# Patient Record
Sex: Female | Born: 1950 | Race: White | Hispanic: No | Smoking: Former smoker
Health system: Southern US, Community
[De-identification: ages and names within clinical notes are randomized; demographics above are authoritative.]

## PROBLEM LIST (undated history)

## (undated) ENCOUNTER — Ambulatory Visit: Admission: EM | Payer: Medicare Other | Source: Home / Self Care

## (undated) DIAGNOSIS — M8580 Other specified disorders of bone density and structure, unspecified site: Secondary | ICD-10-CM

## (undated) DIAGNOSIS — F32A Depression, unspecified: Secondary | ICD-10-CM

## (undated) DIAGNOSIS — I10 Essential (primary) hypertension: Secondary | ICD-10-CM

## (undated) DIAGNOSIS — E785 Hyperlipidemia, unspecified: Secondary | ICD-10-CM

## (undated) DIAGNOSIS — J449 Chronic obstructive pulmonary disease, unspecified: Secondary | ICD-10-CM

## (undated) DIAGNOSIS — I509 Heart failure, unspecified: Secondary | ICD-10-CM

## (undated) DIAGNOSIS — I499 Cardiac arrhythmia, unspecified: Secondary | ICD-10-CM

## (undated) DIAGNOSIS — I2510 Atherosclerotic heart disease of native coronary artery without angina pectoris: Secondary | ICD-10-CM

## (undated) DIAGNOSIS — Z720 Tobacco use: Secondary | ICD-10-CM

## (undated) DIAGNOSIS — Z780 Asymptomatic menopausal state: Secondary | ICD-10-CM

## (undated) DIAGNOSIS — K219 Gastro-esophageal reflux disease without esophagitis: Secondary | ICD-10-CM

## (undated) DIAGNOSIS — F329 Major depressive disorder, single episode, unspecified: Secondary | ICD-10-CM

## (undated) HISTORY — PX: TOTAL ABDOMINAL HYSTERECTOMY: SHX209

## (undated) HISTORY — DX: Major depressive disorder, single episode, unspecified: F32.9

## (undated) HISTORY — PX: CARDIAC CATHETERIZATION: SHX172

## (undated) HISTORY — PX: OTHER SURGICAL HISTORY: SHX169

## (undated) HISTORY — DX: Asymptomatic menopausal state: Z78.0

## (undated) HISTORY — DX: Depression, unspecified: F32.A

## (undated) HISTORY — DX: Hyperlipidemia, unspecified: E78.5

## (undated) HISTORY — DX: Heart failure, unspecified: I50.9

## (undated) HISTORY — PX: ABDOMINAL HYSTERECTOMY: SHX81

## (undated) HISTORY — DX: Tobacco use: Z72.0

## (undated) HISTORY — DX: Gastro-esophageal reflux disease without esophagitis: K21.9

## (undated) HISTORY — DX: Cardiac arrhythmia, unspecified: I49.9

## (undated) HISTORY — PX: CORONARY ANGIOPLASTY: SHX604

## (undated) HISTORY — DX: Essential (primary) hypertension: I10

## (undated) HISTORY — DX: Other specified disorders of bone density and structure, unspecified site: M85.80

---

## 2006-08-02 ENCOUNTER — Other Ambulatory Visit: Payer: Self-pay

## 2006-08-02 ENCOUNTER — Inpatient Hospital Stay: Payer: Self-pay | Admitting: Internal Medicine

## 2008-03-03 ENCOUNTER — Ambulatory Visit: Payer: Self-pay | Admitting: Cardiovascular Disease

## 2010-04-14 ENCOUNTER — Ambulatory Visit: Payer: Self-pay | Admitting: Cardiovascular Disease

## 2010-10-11 ENCOUNTER — Ambulatory Visit: Payer: Self-pay | Admitting: Nephrology

## 2011-01-14 ENCOUNTER — Ambulatory Visit: Payer: Self-pay | Admitting: Specialist

## 2011-08-08 ENCOUNTER — Ambulatory Visit: Payer: Self-pay | Admitting: Specialist

## 2011-08-08 LAB — CREATININE, SERUM
Creatinine: 0.85 mg/dL (ref 0.60–1.30)
EGFR (African American): >60
EGFR (Non-African Amer.): >60

## 2011-08-22 ENCOUNTER — Ambulatory Visit: Payer: Self-pay | Admitting: Specialist

## 2011-10-27 ENCOUNTER — Ambulatory Visit: Payer: Self-pay | Admitting: Specialist

## 2011-10-27 LAB — CREATININE, SERUM
EGFR (African American): >60
EGFR (Non-African Amer.): >60

## 2012-02-03 ENCOUNTER — Ambulatory Visit: Payer: Self-pay | Admitting: Specialist

## 2012-02-10 ENCOUNTER — Ambulatory Visit: Payer: Self-pay | Admitting: Specialist

## 2012-03-07 HISTORY — PX: CHOLECYSTECTOMY: SHX55

## 2012-04-02 ENCOUNTER — Ambulatory Visit: Payer: Self-pay | Admitting: Specialist

## 2012-05-07 ENCOUNTER — Ambulatory Visit: Payer: Self-pay

## 2012-08-22 ENCOUNTER — Ambulatory Visit: Payer: Self-pay

## 2012-10-23 ENCOUNTER — Ambulatory Visit: Payer: Self-pay | Admitting: Physician Assistant

## 2012-10-24 ENCOUNTER — Ambulatory Visit: Payer: Self-pay | Admitting: Specialist

## 2012-12-03 ENCOUNTER — Ambulatory Visit: Payer: Self-pay | Admitting: Gastroenterology

## 2012-12-06 ENCOUNTER — Ambulatory Visit: Payer: Self-pay | Admitting: Surgery

## 2012-12-06 LAB — COMPREHENSIVE METABOLIC PANEL
Albumin: 3.7 g/dL (ref 3.4–5.0)
Alkaline Phosphatase: 103 U/L (ref 50–136)
Anion Gap: 5 — ABNORMAL LOW (ref 7–16)
BUN: 17 mg/dL (ref 7–18)
Bilirubin,Total: 0.3 mg/dL (ref 0.2–1.0)
Calcium, Total: 9.3 mg/dL (ref 8.5–10.1)
Co2: 29 mmol/L (ref 21–32)
Potassium: 3.5 mmol/L (ref 3.5–5.1)
SGPT (ALT): 24 U/L (ref 12–78)
Sodium: 137 mmol/L (ref 136–145)

## 2012-12-10 ENCOUNTER — Ambulatory Visit: Payer: Self-pay | Admitting: Surgery

## 2012-12-10 LAB — CBC WITH DIFFERENTIAL/PLATELET
Basophil %: 0.9 %
Eosinophil #: 0.2 10*3/uL (ref 0.0–0.7)
Eosinophil %: 2.5 %
HGB: 12 g/dL (ref 12.0–16.0)
MCH: 31.5 pg (ref 26.0–34.0)
MCHC: 34.3 g/dL (ref 32.0–36.0)
MCV: 92 fL (ref 80–100)
Neutrophil #: 4.2 10*3/uL (ref 1.4–6.5)
Neutrophil %: 55.2 %
RBC: 3.83 10*6/uL (ref 3.80–5.20)
WBC: 7.7 10*3/uL (ref 3.6–11.0)

## 2012-12-13 ENCOUNTER — Ambulatory Visit: Payer: Self-pay | Admitting: Surgery

## 2012-12-18 LAB — PATHOLOGY REPORT

## 2013-02-04 ENCOUNTER — Ambulatory Visit: Payer: Self-pay | Admitting: Family Medicine

## 2013-02-06 ENCOUNTER — Ambulatory Visit: Payer: Self-pay | Admitting: Family Medicine

## 2013-02-06 LAB — CBC WITH DIFFERENTIAL/PLATELET
Basophil %: 0.5 %
Eosinophil %: 1 %
HGB: 12.2 g/dL (ref 12.0–16.0)
Lymphocyte #: 1.8 10*3/uL (ref 1.0–3.6)
MCH: 31.3 pg (ref 26.0–34.0)
MCHC: 34.6 g/dL (ref 32.0–36.0)
MCV: 91 fL (ref 80–100)
Monocyte #: 0.8 x10 3/mm (ref 0.2–0.9)
Neutrophil %: 72.2 %
Platelet: 223 10*3/uL (ref 150–440)
RBC: 3.89 10*6/uL (ref 3.80–5.20)
RDW: 13.6 % (ref 11.5–14.5)

## 2013-02-11 ENCOUNTER — Emergency Department: Payer: Self-pay | Admitting: Emergency Medicine

## 2013-02-11 LAB — COMPREHENSIVE METABOLIC PANEL
Albumin: 3 g/dL — ABNORMAL LOW (ref 3.4–5.0)
Alkaline Phosphatase: 108 U/L
Anion Gap: 7 (ref 7–16)
Calcium, Total: 9.2 mg/dL (ref 8.5–10.1)
Co2: 23 mmol/L (ref 21–32)
Creatinine: 0.95 mg/dL (ref 0.60–1.30)
EGFR (Non-African Amer.): >60
Glucose: 82 mg/dL (ref 65–99)
Osmolality: 257 (ref 275–301)
Potassium: 3.4 mmol/L — ABNORMAL LOW (ref 3.5–5.1)
SGOT(AST): 42 U/L — ABNORMAL HIGH (ref 15–37)
SGPT (ALT): 21 U/L (ref 12–78)
Sodium: 126 mmol/L — ABNORMAL LOW (ref 136–145)
Total Protein: 7.1 g/dL (ref 6.4–8.2)

## 2013-02-11 LAB — CBC
HCT: 37 % (ref 35.0–47.0)
MCH: 31 pg (ref 26.0–34.0)
MCV: 90 fL (ref 80–100)
Platelet: 253 10*3/uL (ref 150–440)
RBC: 4.1 10*6/uL (ref 3.80–5.20)
RDW: 13.1 % (ref 11.5–14.5)

## 2013-02-15 ENCOUNTER — Ambulatory Visit: Payer: Self-pay | Admitting: Family Medicine

## 2013-03-15 ENCOUNTER — Ambulatory Visit: Payer: Self-pay | Admitting: Family Medicine

## 2013-05-08 ENCOUNTER — Ambulatory Visit: Payer: Self-pay

## 2013-08-06 ENCOUNTER — Ambulatory Visit: Payer: Self-pay | Admitting: Specialist

## 2013-08-08 DIAGNOSIS — F17200 Nicotine dependence, unspecified, uncomplicated: Secondary | ICD-10-CM | POA: Insufficient documentation

## 2013-08-08 DIAGNOSIS — R918 Other nonspecific abnormal finding of lung field: Secondary | ICD-10-CM | POA: Insufficient documentation

## 2013-08-08 DIAGNOSIS — Z720 Tobacco use: Secondary | ICD-10-CM

## 2013-08-08 DIAGNOSIS — J449 Chronic obstructive pulmonary disease, unspecified: Secondary | ICD-10-CM | POA: Insufficient documentation

## 2013-11-29 DIAGNOSIS — J069 Acute upper respiratory infection, unspecified: Secondary | ICD-10-CM | POA: Diagnosis not present

## 2013-12-13 DIAGNOSIS — Z23 Encounter for immunization: Secondary | ICD-10-CM | POA: Diagnosis not present

## 2013-12-19 DIAGNOSIS — R0609 Other forms of dyspnea: Secondary | ICD-10-CM | POA: Diagnosis not present

## 2013-12-19 DIAGNOSIS — J449 Chronic obstructive pulmonary disease, unspecified: Secondary | ICD-10-CM | POA: Diagnosis not present

## 2013-12-19 DIAGNOSIS — J8410 Pulmonary fibrosis, unspecified: Secondary | ICD-10-CM | POA: Diagnosis not present

## 2013-12-19 DIAGNOSIS — F17290 Nicotine dependence, other tobacco product, uncomplicated: Secondary | ICD-10-CM | POA: Diagnosis not present

## 2013-12-25 DIAGNOSIS — J449 Chronic obstructive pulmonary disease, unspecified: Secondary | ICD-10-CM | POA: Diagnosis not present

## 2013-12-25 DIAGNOSIS — J189 Pneumonia, unspecified organism: Secondary | ICD-10-CM | POA: Diagnosis not present

## 2013-12-25 DIAGNOSIS — R509 Fever, unspecified: Secondary | ICD-10-CM | POA: Diagnosis not present

## 2013-12-27 DIAGNOSIS — J189 Pneumonia, unspecified organism: Secondary | ICD-10-CM | POA: Diagnosis not present

## 2014-01-23 DIAGNOSIS — I10 Essential (primary) hypertension: Secondary | ICD-10-CM | POA: Diagnosis not present

## 2014-01-23 DIAGNOSIS — E782 Mixed hyperlipidemia: Secondary | ICD-10-CM | POA: Diagnosis not present

## 2014-01-23 DIAGNOSIS — I2510 Atherosclerotic heart disease of native coronary artery without angina pectoris: Secondary | ICD-10-CM | POA: Diagnosis not present

## 2014-01-23 DIAGNOSIS — Z716 Tobacco abuse counseling: Secondary | ICD-10-CM | POA: Diagnosis not present

## 2014-01-23 DIAGNOSIS — R079 Chest pain, unspecified: Secondary | ICD-10-CM | POA: Diagnosis not present

## 2014-02-04 DIAGNOSIS — R079 Chest pain, unspecified: Secondary | ICD-10-CM | POA: Diagnosis not present

## 2014-02-06 DIAGNOSIS — R079 Chest pain, unspecified: Secondary | ICD-10-CM | POA: Diagnosis not present

## 2014-02-06 DIAGNOSIS — E782 Mixed hyperlipidemia: Secondary | ICD-10-CM | POA: Diagnosis not present

## 2014-02-06 DIAGNOSIS — I10 Essential (primary) hypertension: Secondary | ICD-10-CM | POA: Diagnosis not present

## 2014-02-06 DIAGNOSIS — I2510 Atherosclerotic heart disease of native coronary artery without angina pectoris: Secondary | ICD-10-CM | POA: Diagnosis not present

## 2014-03-04 DIAGNOSIS — I2510 Atherosclerotic heart disease of native coronary artery without angina pectoris: Secondary | ICD-10-CM | POA: Diagnosis not present

## 2014-03-04 DIAGNOSIS — Z0000 Encounter for general adult medical examination without abnormal findings: Secondary | ICD-10-CM | POA: Diagnosis not present

## 2014-03-04 DIAGNOSIS — Z23 Encounter for immunization: Secondary | ICD-10-CM | POA: Diagnosis not present

## 2014-03-04 DIAGNOSIS — F17290 Nicotine dependence, other tobacco product, uncomplicated: Secondary | ICD-10-CM | POA: Diagnosis not present

## 2014-03-04 DIAGNOSIS — I129 Hypertensive chronic kidney disease with stage 1 through stage 4 chronic kidney disease, or unspecified chronic kidney disease: Secondary | ICD-10-CM | POA: Diagnosis not present

## 2014-03-04 DIAGNOSIS — E785 Hyperlipidemia, unspecified: Secondary | ICD-10-CM | POA: Diagnosis not present

## 2014-03-04 DIAGNOSIS — J449 Chronic obstructive pulmonary disease, unspecified: Secondary | ICD-10-CM | POA: Diagnosis not present

## 2014-03-31 DIAGNOSIS — J159 Unspecified bacterial pneumonia: Secondary | ICD-10-CM | POA: Diagnosis not present

## 2014-04-16 DIAGNOSIS — J449 Chronic obstructive pulmonary disease, unspecified: Secondary | ICD-10-CM | POA: Diagnosis not present

## 2014-04-24 DIAGNOSIS — F17210 Nicotine dependence, cigarettes, uncomplicated: Secondary | ICD-10-CM | POA: Diagnosis not present

## 2014-04-24 DIAGNOSIS — J849 Interstitial pulmonary disease, unspecified: Secondary | ICD-10-CM | POA: Diagnosis not present

## 2014-04-24 DIAGNOSIS — J449 Chronic obstructive pulmonary disease, unspecified: Secondary | ICD-10-CM | POA: Diagnosis not present

## 2014-04-24 DIAGNOSIS — R911 Solitary pulmonary nodule: Secondary | ICD-10-CM | POA: Diagnosis not present

## 2014-05-23 ENCOUNTER — Ambulatory Visit: Payer: Self-pay

## 2014-05-23 DIAGNOSIS — Z1231 Encounter for screening mammogram for malignant neoplasm of breast: Secondary | ICD-10-CM | POA: Diagnosis not present

## 2014-06-27 NOTE — Op Note (Signed)
PATIENT NAME:  Kristi Clark, Kristi Clark MR#:  [id number] DATE OF BIRTH:  [birth year]  DATE OF PROCEDURE:  12/13/2012  PREOPERATIVE DIAGNOSIS:  Chronic cholecystitis.   POSTOPERATIVE DIAGNOSIS:  Chronic cholecystitis.   PROCEDURE:  Laparoscopic cholecystectomy, cholangiogram.   SURGEON:  Rochel Brome, M.D.   ANESTHESIA:  General.   INDICATION: This 64 year old female has a history of epigastric pains. She had ultrasound findings of polyps in her gallbladder, and surgery was recommended for definitive treatment.   DESCRIPTION OF PROCEDURE: The patient was placed on the operating table in the supine position under general anesthesia. The abdomen was prepared with ChloraPrep, draped in a sterile manner.  A short incision was made in the inferior aspect of the umbilicus and carried down to the deep fascia which was grasped with laryngeal hook and elevated. A Veress needle was inserted, aspirated and irrigated with a saline solution.  Next, the peritoneal cavity was inflated with carbon dioxide. The Veress needle was removed. The 10 mm cannula was inserted. The 10 mm 0-degree laparoscope was inserted to view the peritoneal cavity. There was some air within the omentum. The liver appeared normal. Another incision was made in the epigastrium just slightly to the right of the midline to introduce an 11 mm cannula. Two incisions were made in the lateral aspect of the right upper quadrant and introduced two 5-mm cannulas.   With the patient in the reverse Trendelenburg position and turned several degrees to the left, the gallbladder was retracted towards the right shoulder. The infundibulum was retracted inferiorly and laterally. The porta hepatis was demonstrated. The cystic duct was dissected free from surrounding structures. The cystic artery was dissected free from surrounding structures. A critical view of safety was demonstrated. The cystic artery was controlled with double Endo Clips and divided. This  allowed better traction on the cystic duct. An Endo Clip was placed across the cystic duct adjacent to the neck of the gallbladder. An incision was made in the cystic duct to introduce the Reddick catheter. Half-strength Conray-60 dye was injected as the cholangiogram was done with fluoroscopy demonstrating the biliary tree and flow of dye into the duodenum. No retained stones were seen. The Reddick catheter was removed. The cystic duct was doubly ligated with Endo Clips and divided. The gallbladder was dissected free from the liver. One other small branch of the cystic artery was controlled with Endo Clip and divided. The gallbladder was further dissected. Several small bleeding points were cauterized. The site was irrigated with heparinized saline solution and aspirated. The gallbladder was completely separated. Hemostasis was intact. The gallbladder was delivered up through the infraumbilical incision, opened, suctioned, removed and was opened, and saw some minimal evidence of polyp formation and the gallbladder was submitted in formalin for routine pathology. The right upper quadrant was further inspected. Hemostasis was intact. The cannulas were removed. Carbon dioxide allowed to escape from the peritoneal cavity. Skin incisions were closed with interrupted 5-0 chromic subcuticular suture, benzoin and Steri-Strips. Dressings were applied with paper tape. The patient tolerated surgery satisfactorily and was prepared for transfer to the recovery room.   ____________________________ Lenna Sciara. Rochel Brome, MD jws:ce D: 12/13/2012 15:34:48 ET T: 12/13/2012 15:56:46 ET JOB#: [id number]  cc: Loreli Dollar, MD, <Dictator> Loreli Dollar MD ELECTRONICALLY SIGNED 12/14/2012 19:09

## 2014-07-08 DIAGNOSIS — E782 Mixed hyperlipidemia: Secondary | ICD-10-CM | POA: Diagnosis not present

## 2014-07-08 DIAGNOSIS — R1084 Generalized abdominal pain: Secondary | ICD-10-CM | POA: Diagnosis not present

## 2014-07-08 DIAGNOSIS — I2510 Atherosclerotic heart disease of native coronary artery without angina pectoris: Secondary | ICD-10-CM | POA: Diagnosis not present

## 2014-07-08 DIAGNOSIS — I10 Essential (primary) hypertension: Secondary | ICD-10-CM | POA: Diagnosis not present

## 2014-07-14 ENCOUNTER — Inpatient Hospital Stay
Admission: EM | Admit: 2014-07-14 | Discharge: 2014-07-18 | DRG: 871 | Disposition: A | Discharge status: Home / Self Care | Payer: Medicare Other | Attending: Internal Medicine | Admitting: Internal Medicine

## 2014-07-14 ENCOUNTER — Encounter: Payer: Self-pay | Admitting: *Deleted

## 2014-07-14 ENCOUNTER — Emergency Department: Payer: Medicare Other

## 2014-07-14 DIAGNOSIS — E785 Hyperlipidemia, unspecified: Secondary | ICD-10-CM | POA: Diagnosis present

## 2014-07-14 DIAGNOSIS — F17210 Nicotine dependence, cigarettes, uncomplicated: Secondary | ICD-10-CM | POA: Diagnosis present

## 2014-07-14 DIAGNOSIS — Z880 Allergy status to penicillin: Secondary | ICD-10-CM | POA: Diagnosis not present

## 2014-07-14 DIAGNOSIS — F329 Major depressive disorder, single episode, unspecified: Secondary | ICD-10-CM | POA: Diagnosis present

## 2014-07-14 DIAGNOSIS — M8580 Other specified disorders of bone density and structure, unspecified site: Secondary | ICD-10-CM | POA: Diagnosis present

## 2014-07-14 DIAGNOSIS — J441 Chronic obstructive pulmonary disease with (acute) exacerbation: Secondary | ICD-10-CM | POA: Diagnosis present

## 2014-07-14 DIAGNOSIS — R509 Fever, unspecified: Secondary | ICD-10-CM | POA: Diagnosis not present

## 2014-07-14 DIAGNOSIS — A419 Sepsis, unspecified organism: Principal | ICD-10-CM | POA: Diagnosis present

## 2014-07-14 DIAGNOSIS — Z79899 Other long term (current) drug therapy: Secondary | ICD-10-CM

## 2014-07-14 DIAGNOSIS — E876 Hypokalemia: Secondary | ICD-10-CM | POA: Diagnosis present

## 2014-07-14 DIAGNOSIS — F17200 Nicotine dependence, unspecified, uncomplicated: Secondary | ICD-10-CM | POA: Diagnosis not present

## 2014-07-14 DIAGNOSIS — E871 Hypo-osmolality and hyponatremia: Secondary | ICD-10-CM | POA: Diagnosis present

## 2014-07-14 DIAGNOSIS — Z7902 Long term (current) use of antithrombotics/antiplatelets: Secondary | ICD-10-CM

## 2014-07-14 DIAGNOSIS — R0602 Shortness of breath: Secondary | ICD-10-CM | POA: Diagnosis not present

## 2014-07-14 DIAGNOSIS — Z720 Tobacco use: Secondary | ICD-10-CM | POA: Diagnosis not present

## 2014-07-14 DIAGNOSIS — J449 Chronic obstructive pulmonary disease, unspecified: Secondary | ICD-10-CM | POA: Diagnosis present

## 2014-07-14 DIAGNOSIS — Z885 Allergy status to narcotic agent status: Secondary | ICD-10-CM | POA: Diagnosis not present

## 2014-07-14 DIAGNOSIS — R6520 Severe sepsis without septic shock: Secondary | ICD-10-CM

## 2014-07-14 DIAGNOSIS — Z881 Allergy status to other antibiotic agents status: Secondary | ICD-10-CM

## 2014-07-14 DIAGNOSIS — Z882 Allergy status to sulfonamides status: Secondary | ICD-10-CM | POA: Diagnosis not present

## 2014-07-14 DIAGNOSIS — J189 Pneumonia, unspecified organism: Secondary | ICD-10-CM | POA: Diagnosis not present

## 2014-07-14 DIAGNOSIS — R05 Cough: Secondary | ICD-10-CM | POA: Diagnosis not present

## 2014-07-14 DIAGNOSIS — Z9109 Other allergy status, other than to drugs and biological substances: Secondary | ICD-10-CM | POA: Diagnosis not present

## 2014-07-14 DIAGNOSIS — E860 Dehydration: Secondary | ICD-10-CM | POA: Diagnosis present

## 2014-07-14 DIAGNOSIS — D649 Anemia, unspecified: Secondary | ICD-10-CM | POA: Diagnosis present

## 2014-07-14 DIAGNOSIS — D638 Anemia in other chronic diseases classified elsewhere: Secondary | ICD-10-CM | POA: Diagnosis present

## 2014-07-14 HISTORY — DX: Sepsis, unspecified organism: R65.20

## 2014-07-14 LAB — CBC WITH DIFFERENTIAL/PLATELET
BASOS PCT: 0 %
Basophils Absolute: 0 10*3/uL (ref 0–0.1)
Eosinophils Absolute: 0 10*3/uL (ref 0–0.7)
Eosinophils Relative: 0 %
HEMATOCRIT: 32.8 % — AB (ref 35.0–47.0)
HEMOGLOBIN: 10.9 g/dL — AB (ref 12.0–16.0)
Lymphocytes Relative: 9 %
Lymphs Abs: 1.2 10*3/uL (ref 1.0–3.6)
MCH: 30.1 pg (ref 26.0–34.0)
MCHC: 33.2 g/dL (ref 32.0–36.0)
MCV: 90.8 fL (ref 80.0–100.0)
MONO ABS: 0.9 10*3/uL (ref 0.2–0.9)
MONOS PCT: 7 %
Neutro Abs: 11.1 10*3/uL — ABNORMAL HIGH (ref 1.4–6.5)
Neutrophils Relative %: 84 %
Platelets: 196 10*3/uL (ref 150–440)
RBC: 3.61 MIL/uL — ABNORMAL LOW (ref 3.80–5.20)
RDW: 14.2 % (ref 11.5–14.5)
WBC: 13.3 10*3/uL — ABNORMAL HIGH (ref 3.6–11.0)

## 2014-07-14 LAB — BASIC METABOLIC PANEL
Anion gap: 10 (ref 5–15)
BUN: 23 mg/dL — ABNORMAL HIGH (ref 6–20)
CALCIUM: 8.4 mg/dL — AB (ref 8.9–10.3)
CO2: 26 mmol/L (ref 22–32)
CREATININE: 1.25 mg/dL — AB (ref 0.44–1.00)
Chloride: 91 mmol/L — ABNORMAL LOW (ref 101–111)
GFR calc Af Amer: 51 mL/min — ABNORMAL LOW (ref >60)
GFR, EST NON AFRICAN AMERICAN: 44 mL/min — AB (ref >60)
Glucose, Bld: 118 mg/dL — ABNORMAL HIGH (ref 65–99)
Potassium: 3 mmol/L — ABNORMAL LOW (ref 3.5–5.1)
Sodium: 127 mmol/L — ABNORMAL LOW (ref 135–145)

## 2014-07-14 LAB — CREATININE, SERUM
Creatinine, Ser: 1.13 mg/dL — ABNORMAL HIGH (ref 0.44–1.00)
GFR calc Af Amer: 58 mL/min — ABNORMAL LOW (ref >60)
GFR calc non Af Amer: 50 mL/min — ABNORMAL LOW (ref >60)

## 2014-07-14 LAB — CBC
HCT: 29.9 % — ABNORMAL LOW (ref 35.0–47.0)
Hemoglobin: 10.2 g/dL — ABNORMAL LOW (ref 12.0–16.0)
MCH: 31 pg (ref 26.0–34.0)
MCHC: 34 g/dL (ref 32.0–36.0)
MCV: 91.2 fL (ref 80.0–100.0)
Platelets: 181 10*3/uL (ref 150–440)
RBC: 3.28 MIL/uL — AB (ref 3.80–5.20)
RDW: 14.2 % (ref 11.5–14.5)
WBC: 9.5 10*3/uL (ref 3.6–11.0)

## 2014-07-14 LAB — TROPONIN I
TROPONIN I: 0.03 ng/mL (ref <0.031)
Troponin I: 0.04 ng/mL — ABNORMAL HIGH (ref <0.031)

## 2014-07-14 LAB — BRAIN NATRIURETIC PEPTIDE: B Natriuretic Peptide: 382 pg/mL — ABNORMAL HIGH (ref 0.0–100.0)

## 2014-07-14 LAB — MAGNESIUM: Magnesium: 1.7 mg/dL (ref 1.7–2.4)

## 2014-07-14 MED ORDER — ATORVASTATIN CALCIUM 20 MG PO TABS
40.0000 mg | ORAL_TABLET | Freq: Every day | ORAL | Status: DC
  Administered 2014-07-14 – 2014-07-18 (×5): 40 mg via ORAL
  Filled 2014-07-14 (×6): qty 2

## 2014-07-14 MED ORDER — SODIUM CHLORIDE 0.9 % IV BOLUS (SEPSIS)
1000.0000 mL | Freq: Once | INTRAVENOUS | Status: AC
  Administered 2014-07-14: 1000 mL via INTRAVENOUS

## 2014-07-14 MED ORDER — METOPROLOL SUCCINATE ER 50 MG PO TB24
50.0000 mg | ORAL_TABLET | Freq: Every day | ORAL | Status: DC
  Administered 2014-07-15 – 2014-07-18 (×4): 50 mg via ORAL
  Filled 2014-07-14 (×5): qty 1

## 2014-07-14 MED ORDER — METHYLPREDNISOLONE SODIUM SUCC 125 MG IJ SOLR
INTRAMUSCULAR | Status: AC
  Administered 2014-07-14: 125 mg via INTRAVENOUS
  Filled 2014-07-14: qty 2

## 2014-07-14 MED ORDER — METHYLPREDNISOLONE SODIUM SUCC 125 MG IJ SOLR
125.0000 mg | Freq: Once | INTRAMUSCULAR | Status: AC
  Administered 2014-07-14: 125 mg via INTRAVENOUS
  Filled 2014-07-14: qty 2

## 2014-07-14 MED ORDER — TIOTROPIUM BROMIDE MONOHYDRATE 18 MCG IN CAPS
18.0000 ug | ORAL_CAPSULE | Freq: Every day | RESPIRATORY_TRACT | Status: DC
  Administered 2014-07-15 – 2014-07-18 (×4): 18 ug via RESPIRATORY_TRACT
  Filled 2014-07-14: qty 5

## 2014-07-14 MED ORDER — CEFTRIAXONE SODIUM IN DEXTROSE 20 MG/ML IV SOLN
1.0000 g | Freq: Once | INTRAVENOUS | Status: AC
  Administered 2014-07-14: 1 g via INTRAVENOUS
  Filled 2014-07-14: qty 50

## 2014-07-14 MED ORDER — FLUTICASONE PROPIONATE 50 MCG/ACT NA SUSP
2.0000 | Freq: Every day | NASAL | Status: DC
  Administered 2014-07-14 – 2014-07-18 (×5): 2 via NASAL
  Filled 2014-07-14: qty 16

## 2014-07-14 MED ORDER — IPRATROPIUM-ALBUTEROL 0.5-2.5 (3) MG/3ML IN SOLN
RESPIRATORY_TRACT | Status: AC
  Administered 2014-07-14: 3 mL via RESPIRATORY_TRACT
  Filled 2014-07-14: qty 3

## 2014-07-14 MED ORDER — DEXTROSE 5 % IV SOLN
500.0000 mg | Freq: Once | INTRAVENOUS | Status: AC
  Administered 2014-07-14: 500 mg via INTRAVENOUS
  Filled 2014-07-14: qty 500

## 2014-07-14 MED ORDER — DEXTROSE 5 % IV SOLN
INTRAVENOUS | Status: AC
  Filled 2014-07-14: qty 10

## 2014-07-14 MED ORDER — ACETAMINOPHEN 325 MG PO TABS
ORAL_TABLET | ORAL | Status: AC
  Administered 2014-07-14: 650 mg via ORAL
  Filled 2014-07-14: qty 2

## 2014-07-14 MED ORDER — IPRATROPIUM-ALBUTEROL 0.5-2.5 (3) MG/3ML IN SOLN
3.0000 mL | Freq: Once | RESPIRATORY_TRACT | Status: AC
  Administered 2014-07-14: 3 mL via RESPIRATORY_TRACT
  Filled 2014-07-14: qty 3

## 2014-07-14 MED ORDER — POTASSIUM CHLORIDE 20 MEQ PO PACK
40.0000 meq | PACK | Freq: Once | ORAL | Status: AC
  Administered 2014-07-14: 40 meq via ORAL

## 2014-07-14 MED ORDER — HYDROCHLOROTHIAZIDE 25 MG PO TABS
25.0000 mg | ORAL_TABLET | Freq: Every day | ORAL | Status: DC
  Administered 2014-07-15 – 2014-07-18 (×4): 25 mg via ORAL
  Filled 2014-07-14 (×4): qty 1

## 2014-07-14 MED ORDER — LOSARTAN POTASSIUM 50 MG PO TABS
100.0000 mg | ORAL_TABLET | Freq: Every day | ORAL | Status: DC
  Administered 2014-07-14 – 2014-07-18 (×5): 100 mg via ORAL
  Filled 2014-07-14 (×5): qty 2

## 2014-07-14 MED ORDER — LEVOFLOXACIN IN D5W 750 MG/150ML IV SOLN
750.0000 mg | INTRAVENOUS | Status: DC
  Administered 2014-07-14: 750 mg via INTRAVENOUS
  Filled 2014-07-14: qty 150

## 2014-07-14 MED ORDER — PANTOPRAZOLE SODIUM 40 MG PO TBEC
40.0000 mg | DELAYED_RELEASE_TABLET | Freq: Every day | ORAL | Status: DC
  Administered 2014-07-14 – 2014-07-18 (×5): 40 mg via ORAL
  Filled 2014-07-14 (×5): qty 1

## 2014-07-14 MED ORDER — ALBUTEROL SULFATE (2.5 MG/3ML) 0.083% IN NEBU
INHALATION_SOLUTION | RESPIRATORY_TRACT | Status: AC
  Administered 2014-07-14: 2.5 mg via RESPIRATORY_TRACT
  Filled 2014-07-14: qty 6

## 2014-07-14 MED ORDER — LEVOFLOXACIN IN D5W 750 MG/150ML IV SOLN: 750.0000 mg | INTRAVENOUS | Status: DC

## 2014-07-14 MED ORDER — CLOPIDOGREL BISULFATE 75 MG PO TABS
75.0000 mg | ORAL_TABLET | Freq: Every day | ORAL | Status: DC
  Administered 2014-07-14 – 2014-07-18 (×5): 75 mg via ORAL
  Filled 2014-07-14 (×5): qty 1

## 2014-07-14 MED ORDER — POTASSIUM CHLORIDE 20 MEQ PO PACK
PACK | ORAL | Status: AC
  Administered 2014-07-14: 40 meq via ORAL
  Filled 2014-07-14: qty 2

## 2014-07-14 MED ORDER — HEPARIN SODIUM (PORCINE) 5000 UNIT/ML IJ SOLN
5000.0000 [IU] | Freq: Three times a day (TID) | INTRAMUSCULAR | Status: DC
  Administered 2014-07-14 – 2014-07-18 (×11): 5000 [IU] via SUBCUTANEOUS
  Filled 2014-07-14 (×11): qty 1

## 2014-07-14 MED ORDER — ALBUTEROL SULFATE (2.5 MG/3ML) 0.083% IN NEBU
3.0000 mL | INHALATION_SOLUTION | Freq: Four times a day (QID) | RESPIRATORY_TRACT | Status: DC | PRN
Start: 2014-07-14 — End: 2014-07-18

## 2014-07-14 MED ORDER — ALBUTEROL SULFATE (2.5 MG/3ML) 0.083% IN NEBU
2.5000 mg | INHALATION_SOLUTION | Freq: Once | RESPIRATORY_TRACT | Status: DC
  Filled 2014-07-14: qty 3

## 2014-07-14 MED ORDER — ASPIRIN 81 MG PO TABS
81.0000 mg | ORAL_TABLET | Freq: Every day | ORAL | Status: DC
Start: 2014-07-14 — End: 2014-07-18
  Administered 2014-07-15 – 2014-07-18 (×4): 81 mg via ORAL
  Filled 2014-07-14 (×10): qty 1

## 2014-07-14 MED ORDER — ACETAMINOPHEN 325 MG PO TABS
650.0000 mg | ORAL_TABLET | Freq: Once | ORAL | Status: AC
  Administered 2014-07-14: 650 mg via ORAL

## 2014-07-14 MED ORDER — NICOTINE 14 MG/24HR TD PT24
14.0000 mg | MEDICATED_PATCH | Freq: Every day | TRANSDERMAL | Status: DC
  Administered 2014-07-14 – 2014-07-18 (×5): 14 mg via TRANSDERMAL
  Filled 2014-07-14 (×5): qty 1

## 2014-07-14 MED ORDER — ALBUTEROL SULFATE (2.5 MG/3ML) 0.083% IN NEBU
2.5000 mg | INHALATION_SOLUTION | Freq: Once | RESPIRATORY_TRACT | Status: AC
  Administered 2014-07-14: 2.5 mg via RESPIRATORY_TRACT
  Filled 2014-07-14: qty 3

## 2014-07-14 MED ORDER — DEXTROSE 5 % IV SOLN
INTRAVENOUS | Status: AC
  Administered 2014-07-14: 500 mg via INTRAVENOUS
  Filled 2014-07-14: qty 500

## 2014-07-14 MED ORDER — LORATADINE 10 MG PO TABS: 10.0000 mg | ORAL_TABLET | Freq: Every day | ORAL | Status: DC | PRN

## 2014-07-14 MED ORDER — MOMETASONE FURO-FORMOTEROL FUM 100-5 MCG/ACT IN AERO
2.0000 | INHALATION_SPRAY | Freq: Two times a day (BID) | RESPIRATORY_TRACT | Status: DC
  Administered 2014-07-14 – 2014-07-18 (×8): 2 via RESPIRATORY_TRACT
  Filled 2014-07-14: qty 8.8

## 2014-07-14 MED ORDER — SODIUM CHLORIDE 0.9 % IV SOLN
INTRAVENOUS | Status: DC
  Administered 2014-07-14 – 2014-07-16 (×5): via INTRAVENOUS

## 2014-07-14 NOTE — ED Notes (Signed)
Admitting MD, Bridgett Larsson, at bedside, aware of pt current temp and hr.

## 2014-07-14 NOTE — ED Notes (Signed)
Assisted pt to restroom  

## 2014-07-14 NOTE — Progress Notes (Signed)
ANTIBIOTIC CONSULT NOTE - INITIAL  Pharmacy Consult for Antibiotic dosing Indication: pneumonia  Allergies  Allergen Reactions  . Codeine Sulfate Nausea Only  . Doxycycline Diarrhea    Severe  . Amoxicillin Rash  . Avelox [Moxifloxacin Hcl In Nacl] Rash  . Penicillins Rash  . Sulfa Antibiotics Rash    Patient Measurements: Height: 4\' 11"  (149.9 cm) Weight: 147 lb 7 oz (66.877 kg) IBW/kg (Calculated) : 43.2 Adjusted Body Weight:   Vital Signs: Temp: 98.9 F (37.2 C) (05/09 1724) Temp Source: Oral (05/09 1724) BP: 106/41 mmHg (05/09 1724) Pulse Rate: 111 (05/09 1724) Intake/Output from previous day:   Intake/Output from this shift:    Labs:  Recent Labs  07/14/14 1413  WBC 13.3*  HGB 10.9*  PLT 196  CREATININE 1.25*   Estimated Creatinine Clearance: 37.3 mL/min (by C-G formula based on Cr of 1.25). No results for input(s): VANCOTROUGH, VANCOPEAK, VANCORANDOM, GENTTROUGH, GENTPEAK, GENTRANDOM, TOBRATROUGH, TOBRAPEAK, TOBRARND, AMIKACINPEAK, AMIKACINTROU, AMIKACIN in the last 72 hours.   Microbiology: No results found for this or any previous visit (from the past 720 hour(s)).  Medical History: Past Medical History  Diagnosis Date  . Menopause   . Depression   . Hyperlipidemia   . Tobacco abuse   . Osteopenia     Medications:  Scheduled:  . albuterol  2.5 mg Nebulization Once  . aspirin  81 mg Oral Daily  . atorvastatin  40 mg Oral Daily  . clopidogrel  75 mg Oral Daily  . cefTRIAXone (ROCEPHIN) IVPB 1 gram/50 mL D5W      . fluticasone  2 spray Each Nare Daily  . heparin  5,000 Units Subcutaneous 3 times per day  . hydrochlorothiazide  25 mg Oral Daily  . levofloxacin (LEVAQUIN) IV  750 mg Intravenous Q48H  . losartan  100 mg Oral Daily  . metoprolol succinate  50 mg Oral Daily  . mometasone-formoterol  2 puff Inhalation BID  . nicotine  14 mg Transdermal Daily  . pantoprazole  40 mg Oral Daily  . tiotropium  18 mcg Inhalation Daily    Assessment: Pharmacy consulted to antibiotics in this 64yo F being treated for possible CAP. Ceftriaxone and azithromycin ordered for one dose in ED and given. Levaquin 750mg  IV daily ordered by MD  Plan:  Follow up culture results  Levaquin 750mg  IV daily changed to levaquin 750mg  IV Q48hrs due to renal function.  Pharmacy will continue to follow.  [address] [place], [place] D., BCPS 07/14/2014

## 2014-07-14 NOTE — H&P (Signed)
[facility] [facility] NAME: Kristi Clark    MR#:  [id number]  DATE OF BIRTH:  [birth year]  DATE OF ADMISSION:  07/14/2014  PRIMARY CARE PHYSICIAN: Kathrine Haddock, NP   REQUESTING/REFERRING PHYSICIAN: Dr. Corky Downs  CHIEF COMPLAINT:   Chief Complaint  Patient presents with  . Cough  . Shortness of Breath  . Fever    HISTORY OF PRESENT ILLNESS:  Kristi Clark  is a 64 y.o. female with a known history of COPD,  hyperlipidemia and depression. Patient has had a cough and the sputum for the past 4 days. The patient also complains of fever, chills and shortness of breath. She has a generalized weakness and poor oral intake. Her temperature was 102 ED. She also has a tachycardia with a heart rate at 140s. Chest x-ray showed pneumonia. She was treated with the Zithromax and Rocephin in the ED. PAST MEDICAL HISTORY:   Past Medical History  Diagnosis Date  . Menopause   . Depression   . Hyperlipidemia   . Tobacco abuse   . Osteopenia     PAST SURGICAL HISTORY:   Past Surgical History  Procedure Laterality Date  . Abdominal hysterectomy    . Cholecystectomy  2014    SOCIAL HISTORY:   History  Substance Use Topics  . Smoking status: Current Every Day Smoker -- 2.00 packs/day    Types: Cigarettes  . Smokeless tobacco: Not on file  . Alcohol Use: No    FAMILY HISTORY:   Family History  Problem Relation Age of Onset  . Cancer Mother   . Stroke Mother   . Heart disease Father   . Hyperlipidemia Father   . Cancer Sister   . Diabetes Sister   . Diabetes Brother   . Asthma Son   . Cancer Son     DRUG ALLERGIES:   Allergies  Allergen Reactions  . Codeine Sulfate Nausea Only  . Doxycycline Diarrhea    Severe  . Amoxicillin Rash  . Avelox [Moxifloxacin Hcl In Nacl] Rash  . Sulfa Antibiotics Rash    REVIEW OF SYSTEMS:  CONSTITUTIONAL: Positive for fever, chills and generalized weakness EYES: No blurred or  double vision.  EARS, NOSE, AND THROAT: No tinnitus or ear pain.  RESPIRATORY: Positive for cough, sputum shortness of breath, but no wheezing or hemoptysis.  CARDIOVASCULAR: No chest pain, orthopnea, edema.  GASTROINTESTINAL: No nausea, vomiting, diarrhea or abdominal pain.  GENITOURINARY: No dysuria, hematuria.  ENDOCRINE: No polyuria, nocturia,  HEMATOLOGY: No anemia, easy bruising or bleeding SKIN: No rash or lesion. MUSCULOSKELETAL: No joint pain or arthritis.   NEUROLOGIC: No tingling, numbness, weakness.  PSYCHIATRY: No anxiety or depression.   MEDICATIONS AT HOME:   Prior to Admission medications   Medication Sig Start Date End Date Taking? Authorizing Provider  albuterol (PROVENTIL HFA;VENTOLIN HFA) 108 (90 BASE) MCG/ACT inhaler Inhale 2 puffs into the lungs every 6 (six) hours as needed for wheezing or shortness of breath.   Yes Historical Provider, MD  aspirin 81 MG tablet Take 81 mg by mouth daily.   Yes Historical Provider, MD  atorvastatin (LIPITOR) 40 MG tablet Take 40 mg by mouth daily.   Yes Historical Provider, MD  clopidogrel (PLAVIX) 75 MG tablet Take 75 mg by mouth daily.   Yes Historical Provider, MD  fluticasone (FLONASE) 50 MCG/ACT nasal spray Place 2 sprays into both nostrils daily.   Yes Historical Provider, MD  Fluticasone-Salmeterol (ADVAIR) 250-50 MCG/DOSE AEPB Inhale 1 puff  into the lungs 2 (two) times daily.   Yes Historical Provider, MD  hydrochlorothiazide (HYDRODIURIL) 25 MG tablet Take 25 mg by mouth daily.   Yes Historical Provider, MD  loratadine (CLARITIN) 10 MG tablet Take 10 mg by mouth daily as needed for allergies.   Yes Historical Provider, MD  losartan (COZAAR) 100 MG tablet Take 100 mg by mouth daily.   Yes Historical Provider, MD  metoprolol succinate (TOPROL-XL) 50 MG 24 hr tablet Take 50 mg by mouth daily. Take with or immediately following a meal.   Yes Historical Provider, MD  omeprazole (PRILOSEC) 20 MG capsule Take 20 mg by mouth daily.    Yes Historical Provider, MD  tiotropium (SPIRIVA) 18 MCG inhalation capsule Place 18 mcg into inhaler and inhale daily.   Yes Historical Provider, MD      VITAL SIGNS:  Blood pressure 108/35, pulse 118, temperature 99.4 F (37.4 C), temperature source Oral, resp. rate 22, SpO2 99 %.  PHYSICAL EXAMINATION:  GENERAL:  64 y.o.-year-old patient lying in the bed with no acute distress.  EYES: Pupils equal, round, reactive to light and accommodation. No scleral icterus. Extraocular muscles intact.  HEENT: Head atraumatic, normocephalic. Oropharynx and nasopharynx clear.  NECK:  Supple, no jugular venous distention. No thyroid enlargement, no tenderness.  LUNGS: Normal breath sounds bilaterally, weak lung sounds. no wheezing, rales,rhonchi or crepitation. No use of accessory muscles of respiration.  CARDIOVASCULAR: S1, S2 normal. No murmurs, rubs, or gallops.  ABDOMEN: Soft, nontender, nondistended. Bowel sounds present. No organomegaly or mass.  EXTREMITIES: No pedal edema, cyanosis, or clubbing.  NEUROLOGIC: Cranial nerves II through XII are intact. Muscle strength 5/5 in all extremities. Sensation intact. Gait not checked.  PSYCHIATRIC: The patient is alert and oriented x 3.  SKIN: No obvious rash, lesion, or ulcer.   LABORATORY PANEL:   CBC  Recent Labs Lab 07/14/14 1413  WBC 13.3*  HGB 10.9*  HCT 32.8*  PLT 196   ------------------------------------------------------------------------------------------------------------------  Chemistries   Recent Labs Lab 07/14/14 1413  NA 127*  K 3.0*  CL 91*  CO2 26  GLUCOSE 118*  BUN 23*  CREATININE 1.25*  CALCIUM 8.4*   ------------------------------------------------------------------------------------------------------------------  Cardiac Enzymes  Recent Labs Lab 07/14/14 1413  TROPONINI 0.04*    ------------------------------------------------------------------------------------------------------------------  RADIOLOGY:  Dg Chest 2 View  07/14/2014   CLINICAL DATA:  Cough and fever ; shortness of breath for 3 days  EXAM: CHEST  2 VIEW  COMPARISON:  Chest radiograph March 15, 2013 and chest CT August 06, 2013  FINDINGS: There is underlying emphysematous change. There is predominantly interstitial infiltrate in the right middle lobe. There is stable scarring in the right upper lobe. There is also slight scarring in the left base. Heart size and pulmonary vascularity are normal. There is atherosclerotic change in aorta. No adenopathy. There is degenerative change in the thoracic spine.  IMPRESSION: Predominantly interstitial pneumonitis right lower lobe. Areas of scarring in the right upper lobe and to a lesser extent left base. Underlying emphysema.   Electronically Signed   By: Lowella Grip III M.D.   On: 07/14/2014 14:52    EKG:   Orders placed or performed during the hospital encounter of 07/14/14  . ED EKG  . ED EKG  . ED EKG  . ED EKG    IMPRESSION AND PLAN:  Pneumonia with sepsis. Patient was treated with the Zithromax and Rocephin in the ED. I will started Levaquin for community acquired pneumonia, follow-up CBC, blood culture and  sputum culture. DuoNeb when necessary and oxygen by nasal cannular. Elevated troponin. Possible due to demanding ischemia, I will continue patient on medication aspirin and Plavix, follow-up troponin level. COPD. Continue home nebulizer. Dehydration. I will start normal saline IV, follow-up BMP. Hypokalemia. I will give potassium supplement and follow-up potassium level and magnesium level. Hyponatremia. Normal saline support, follow-up BMP. Anemia. Follow-up CBC. Tobacco abuse. Patient was consult for smoking cessation for 4 minutes, she will be given nicotine patch.    All the records are reviewed and case discussed with ED  provider. Management plans discussed with the patient, sister and they are in agreement.  CODE STATUS: Full code.  TOTAL TIME TAKING CARE OF THIS PATIENT: 63 minutes.    Demetrios Loll M.D on 07/14/2014 at 4:29 PM  Between 7am to 6pm - Pager - [phone]  After 6pm go to [email or web address] - password [email or web address] [facility]  [facility]  Office  [phone]  CC: Primary care physician; Kathrine Haddock, NP

## 2014-07-14 NOTE — ED Notes (Signed)
Pt c/o cough with congestion , fever, bodyaches since friday

## 2014-07-14 NOTE — ED Provider Notes (Addendum)
[facility] Emergency Department Provider Note  ____________________________________________  Time seen: 1:30 PM  I have reviewed the triage vital signs and the nursing notes.   HISTORY  Chief Complaint Cough; Shortness of Breath; and Fever      HPI Kristi Clark is a 64 y.o. female who presents with shortness of breath that has worsened over the last 3 days. She notes a history of COPD and is an active smoker. She developed a mild cough fever and myalgias and worsening shortness of breath over the last 3 days. She went to urgent care and they referred her to the emergency department for low oxygen, elevated heart rate, and fever. She reports she becomes winded with any exertion at all. She has never been on oxygen before. She does not have chest pain nor lower show any swelling     Past Medical History  Diagnosis Date  . Menopause   . Depression   . Hyperlipidemia   . Tobacco abuse   . Osteopenia     There are no active problems to display for this patient.   Past Surgical History  Procedure Laterality Date  . Abdominal hysterectomy    . Cholecystectomy  2014    Current Outpatient Rx  Name  Route  Sig  Dispense  Refill  . albuterol (PROVENTIL HFA;VENTOLIN HFA) 108 (90 BASE) MCG/ACT inhaler   Inhalation   Inhale 2 puffs into the lungs every 6 (six) hours as needed for wheezing or shortness of breath.         Marland Kitchen aspirin 81 MG tablet   Oral   Take 81 mg by mouth daily.         Marland Kitchen atorvastatin (LIPITOR) 40 MG tablet   Oral   Take 40 mg by mouth daily.         . clopidogrel (PLAVIX) 75 MG tablet   Oral   Take 75 mg by mouth daily.         . fluticasone (FLONASE) 50 MCG/ACT nasal spray   Each Nare   Place 2 sprays into both nostrils daily.         . Fluticasone-Salmeterol (ADVAIR) 250-50 MCG/DOSE AEPB   Inhalation   Inhale 1 puff into the lungs 2 (two) times daily.         . hydrochlorothiazide (HYDRODIURIL) 25 MG  tablet   Oral   Take 25 mg by mouth daily.         Marland Kitchen loratadine (CLARITIN) 10 MG tablet   Oral   Take 10 mg by mouth daily as needed for allergies.         Marland Kitchen losartan (COZAAR) 100 MG tablet   Oral   Take 100 mg by mouth daily.         . metoprolol succinate (TOPROL-XL) 50 MG 24 hr tablet   Oral   Take 50 mg by mouth daily. Take with or immediately following a meal.         . omeprazole (PRILOSEC) 20 MG capsule   Oral   Take 20 mg by mouth daily.         Marland Kitchen tiotropium (SPIRIVA) 18 MCG inhalation capsule   Inhalation   Place 18 mcg into inhaler and inhale daily.           Allergies Codeine sulfate; Doxycycline; Amoxicillin; Avelox; and Sulfa antibiotics  Family History  Problem Relation Age of Onset  . Cancer Mother   . Stroke Mother   . Heart disease  Father   . Hyperlipidemia Father   . Cancer Sister   . Diabetes Sister   . Diabetes Brother   . Asthma Son   . Cancer Son     Social History History  Substance Use Topics  . Smoking status: Current Every Day Smoker -- 2.00 packs/day    Types: Cigarettes  . Smokeless tobacco: Not on file  . Alcohol Use: No    Review of Systems  Constitutional: Negative for fever. Eyes: Negative for visual changes. ENT: Negative for sore throat. Cardiovascular: Negative for chest pain. Respiratory: Positive for shortness of breath, cough Gastrointestinal: Negative for abdominal pain, vomiting and diarrhea. Genitourinary: Negative for dysuria. Musculoskeletal: Negative for back pain. Skin: Negative for rash. Neurological: Negative for headaches, focal weakness or numbness. Psychiatric: No anxiety  10-point ROS otherwise negative.  ____________________________________________   PHYSICAL EXAM:     Constitutional: Alert and oriented. Well appearing and in no distress. Eyes: Conjunctivae are normal. PERRL. Normal extraocular movements. ENT   Head: Normocephalic and atraumatic.   Nose: No  congestion/rhinnorhea.   Mouth/Throat: Mucous membranes are moist.   Neck: No stridor. Hematological/Lymphatic/Immunilogical: No cervical lymphadenopathy. Cardiovascular: Tachycardia, regular rhythm. Normal and symmetric distal pulses are present in all extremities. No murmurs, rubs, or gallops. Respiratory: Tachypnea, mild scattered wheezes Gastrointestinal: Soft and nontender. No distention. There is no CVA tenderness. Genitourinary: deferred Musculoskeletal: Nontender with normal range of motion in all extremities. No joint effusions.  No lower extremity tenderness nor edema. Neurologic:  Normal speech and language. No gross focal neurologic deficits are appreciated. Speech is normal.  Skin:  Skin is warm, dry and intact. No rash noted. Psychiatric: Mood and affect are normal. Speech and behavior are normal. Patient exhibits appropriate insight and judgment.  ____________________________________________    LABS (pertinent positives/negatives)  Labs Reviewed  CBC WITH DIFFERENTIAL/PLATELET - Abnormal; Notable for the following:    WBC 13.3 (*)    RBC 3.61 (*)    Hemoglobin 10.9 (*)    HCT 32.8 (*)    Neutro Abs 11.1 (*)    All other components within normal limits  CULTURE, BLOOD (ROUTINE X 2)  CULTURE, BLOOD (ROUTINE X 2)  BASIC METABOLIC PANEL  BRAIN NATRIURETIC PEPTIDE  TROPONIN I     ____________________________________________   EKG  ED ECG REPORT I, Lavonia Drafts, the attending physician, personally viewed and interpreted this ECG.   Date: 07/14/2014  EKG Time: 3:48  Rate: 127  Rhythm: sinus tachycardia  Axis: Normal  Intervals:none  ST&T Change: Nonspecific changes   ____________________________________________    RADIOLOGY  Chest x-ray consistent with pneumonitis  ____________________________________________   PROCEDURES  Procedure(s) performed: None  Critical Care performed: None     ____________________________________________   INITIAL IMPRESSION / ASSESSMENT AND PLAN / ED COURSE  Pertinent labs & imaging results that were available during my care of the patient were reviewed by me and considered in my medical decision making (see chart for details).  Patient with significant increased work of breathing scattered wheezes and a history of COPD who presents with fevers chills and cough is worrisome for either pneumonia versus upper respiratory infection that has caused a significant COPD exacerbation. We will give IV Solu-Medrol and nebulized albuterol check blood work and a chest x-ray. I suspect patient will require admission  ____________________________________________ ----------------------------------------- 3:03 PM on 07/14/2014 -----------------------------------------  Given concerning chest x-ray hypoxia tachycardia and fever presentation consistent with pneumonia. Ordered 1 g of Rocephin IV and 500 mg of azithromycin IV and will admit  to the hospital  FINAL CLINICAL IMPRESSION(S) / ED DIAGNOSES  Final diagnoses:  None     Lavonia Drafts, MD 07/14/14 1504  Lavonia Drafts, MD 07/25/14 1515

## 2014-07-15 LAB — BASIC METABOLIC PANEL
ANION GAP: 7 (ref 5–15)
BUN: 18 mg/dL (ref 6–20)
CHLORIDE: 101 mmol/L (ref 101–111)
CO2: 25 mmol/L (ref 22–32)
Calcium: 8.2 mg/dL — ABNORMAL LOW (ref 8.9–10.3)
Creatinine, Ser: 0.79 mg/dL (ref 0.44–1.00)
GFR calc Af Amer: >60 mL/min (ref >60)
GFR calc non Af Amer: >60 mL/min (ref >60)
GLUCOSE: 158 mg/dL — AB (ref 65–99)
Potassium: 3.5 mmol/L (ref 3.5–5.1)
Sodium: 133 mmol/L — ABNORMAL LOW (ref 135–145)

## 2014-07-15 LAB — CBC WITH DIFFERENTIAL/PLATELET
BASOS PCT: 0 %
Basophils Absolute: 0 10*3/uL (ref 0–0.1)
EOS PCT: 0 %
Eosinophils Absolute: 0 10*3/uL (ref 0–0.7)
HCT: 30.9 % — ABNORMAL LOW (ref 35.0–47.0)
HEMOGLOBIN: 10.3 g/dL — AB (ref 12.0–16.0)
Lymphocytes Relative: 10 %
Lymphs Abs: 0.7 10*3/uL — ABNORMAL LOW (ref 1.0–3.6)
MCH: 30.5 pg (ref 26.0–34.0)
MCHC: 33.4 g/dL (ref 32.0–36.0)
MCV: 91.4 fL (ref 80.0–100.0)
Monocytes Absolute: 0.2 10*3/uL (ref 0.2–0.9)
Monocytes Relative: 2 %
NEUTROS PCT: 88 %
Neutro Abs: 6.3 10*3/uL (ref 1.4–6.5)
Platelets: 176 10*3/uL (ref 150–440)
RBC: 3.38 MIL/uL — ABNORMAL LOW (ref 3.80–5.20)
RDW: 13.7 % (ref 11.5–14.5)
WBC: 7.2 10*3/uL (ref 3.6–11.0)

## 2014-07-15 LAB — HIV ANTIBODY (ROUTINE TESTING W REFLEX): HIV SCREEN 4TH GENERATION: NONREACTIVE

## 2014-07-15 MED ORDER — LEVOFLOXACIN IN D5W 750 MG/150ML IV SOLN
750.0000 mg | INTRAVENOUS | Status: DC
  Administered 2014-07-15 – 2014-07-17 (×3): 750 mg via INTRAVENOUS
  Filled 2014-07-15 (×4): qty 150

## 2014-07-15 MED ORDER — METHYLPREDNISOLONE SODIUM SUCC 125 MG IJ SOLR
60.0000 mg | INTRAMUSCULAR | Status: DC
  Administered 2014-07-15 – 2014-07-17 (×3): 60 mg via INTRAVENOUS
  Filled 2014-07-15 (×3): qty 2

## 2014-07-15 NOTE — Progress Notes (Addendum)
ANTIBIOTIC CONSULT NOTE - INITIAL  Pharmacy Consult for Antibiotic dosing Indication: pneumonia  Allergies  Allergen Reactions  . Codeine Sulfate Nausea Only  . Doxycycline Diarrhea    Severe  . Amoxicillin Rash  . Avelox [Moxifloxacin Hcl In Nacl] Rash  . Penicillins Rash  . Sulfa Antibiotics Rash    Patient Measurements: Height: 4\' 11"  (149.9 cm) Weight: 147 lb 7 oz (66.877 kg) IBW/kg (Calculated) : 43.2 Adjusted Body Weight:   Vital Signs: Temp: 97.5 F (36.4 C) (05/10 0817) Temp Source: Oral (05/10 0817) BP: 136/56 mmHg (05/10 0817) Pulse Rate: 84 (05/10 0817) Intake/Output from previous day: 05/09 0701 - 05/10 0700 In: 1200 [I.V.:1200] Out: 2000 [Urine:2000] Intake/Output from this shift: Total I/O In: 240 [P.O.:240] Out: -   Labs:  Recent Labs  07/14/14 1413 07/14/14 1819 07/15/14 0555  WBC 13.3* 9.5 7.2  HGB 10.9* 10.2* 10.3*  PLT 196 181 176  CREATININE 1.25* 1.13* 0.79   Estimated Creatinine Clearance: 58.3 mL/min (by C-G formula based on Cr of 0.79). No results for input(s): VANCOTROUGH, VANCOPEAK, VANCORANDOM, GENTTROUGH, GENTPEAK, GENTRANDOM, TOBRATROUGH, TOBRAPEAK, TOBRARND, AMIKACINPEAK, AMIKACINTROU, AMIKACIN in the last 72 hours.   Microbiology: Recent Results (from the past 720 hour(s))  Culture, blood (routine x 2)     Status: None (Preliminary result)   Collection Time: 07/14/14  2:13 PM  Result Value Ref Range Status   Specimen Description BLOOD  Final   Special Requests BLOOD  Final   Culture NO GROWTH < 24 HOURS  Final   Report Status PENDING  Incomplete  Culture, blood (routine x 2)     Status: None (Preliminary result)   Collection Time: 07/14/14  2:13 PM  Result Value Ref Range Status   Specimen Description BLOOD  Final   Special Requests BLOOD  Final   Culture NO GROWTH < 24 HOURS  Final   Report Status PENDING  Incomplete    Medical History: Past Medical History  Diagnosis Date  . Menopause   . Depression   .  Hyperlipidemia   . Tobacco abuse   . Osteopenia     Medications:  Scheduled:  . albuterol  2.5 mg Nebulization Once  . aspirin  81 mg Oral Daily  . atorvastatin  40 mg Oral Daily  . clopidogrel  75 mg Oral Daily  . fluticasone  2 spray Each Nare Daily  . heparin  5,000 Units Subcutaneous 3 times per day  . hydrochlorothiazide  25 mg Oral Daily  . levofloxacin (LEVAQUIN) IV  750 mg Intravenous Q24H  . losartan  100 mg Oral Daily  . methylPREDNISolone (SOLU-MEDROL) injection  60 mg Intravenous Q24H  . metoprolol succinate  50 mg Oral Daily  . mometasone-formoterol  2 puff Inhalation BID  . nicotine  14 mg Transdermal Daily  . pantoprazole  40 mg Oral Daily  . tiotropium  18 mcg Inhalation Daily   Assessment: Pharmacy consulted to antibiotics in this 64yo F being treated for possible CAP. Ceftriaxone and azithromycin ordered for one dose in ED and given. Levaquin 750mg  IV daily ordered by MD  Plan:  Follow up culture results  Levaquin 750mg  IV Q48h changed to levaquin 750mg  IV Q24h due to improved renal function.   Chinita Greenland PharmD Clinical Pharmacist 07/15/2014

## 2014-07-15 NOTE — Progress Notes (Signed)
Initial Nutrition Assessment  INTERVENTION:  Meals and Snacks: Cater to patient preferences Medical Food Supplement Therapy: will recommend on follow if intake poor  NUTRITION DIAGNOSIS  Inadequate oral intake related to acute illness as evidenced by pt daughter reports poor appetite starting this past Friday and through the weekend.  GOAL:  Patient will meet greater than or equal to 90% of their needs  MONITOR:  Energy Intake Electrolyte and Renal Profile Anthropometrics   REASON FOR ASSESSMENT:  Malnutrition Screening Tool    ASSESSMENT:  Pt admitted with pna PMHx: COPD, depression, HLD, osteopenia  PO Intake: Recorded po intake 100% of breakfast this am; daughter reports pt ate 'some.' Daughter reports pt with decreased appetite over the past few days started Friday when pt wasn't feeling well.   Medications: NS at 142mL/hr, Protonix, KCl Labs:  Electrolyte and Renal Profile:    Recent Labs Lab 07/14/14 1413 07/14/14 1819 07/15/14 0555  BUN 23*  --  18  CREATININE 1.25* 1.13* 0.79  NA 127*  --  133*  K 3.0*  --  3.5  MG 1.7  --   --    Glucose Profile: No results for input(s): GLUCAP in the last 72 hours. Protein Profile: No results for input(s): ALBUMIN in the last 168 hours.  Height:  Ht Readings from Last 1 Encounters:  07/14/14 4\' 11"  (1.499 m)    Weight:  Wt Readings from Last 1 Encounters:  07/14/14 147 lb 7 oz (66.877 kg)     Wt Readings from Last 10 Encounters:  07/14/14 147 lb 7 oz (66.877 kg)  04/16/14 146 lb (66.225 kg)    BMI:  Body mass index is 29.76 kg/(m^2).  Diet Order:  DIET SOFT Room service appropriate?: Yes; Fluid consistency:: Thin  EDUCATION NEEDS:  No education needs identified at this time   Intake/Output Summary (Last 24 hours) at 07/15/14 1259 Last data filed at 07/15/14 0745  Gross per 24 hour  Intake   1440 ml  Output   2000 ml  Net   -560 ml    Last BM:  5/9  LOW Care Level  Dwyane Luo, RD,  LDN Pager [phone]

## 2014-07-15 NOTE — Progress Notes (Signed)
[place] at [facility]                                                                                                                                                                                            Patient Demographics   Kristi Clark, is a 64 y.o. female, DOB - 22-May-1950, OIN:[id number]  Admit date - 07/14/2014   Admitting Physician Demetrios Loll, MD  Outpatient Primary MD for the patient is Kathrine Haddock, NP    Chief Complaint  Patient presents with  . Cough  . Shortness of Breath  . Fever      Pt breathing improved, still coughing and wheezing.   Review of Systems:   CONSTITUTIONAL: No documented fever. No fatigue, weakness. No weight gain, no weight loss.  EYES: No blurry or double vision.  ENT: No tinnitus. No postnasal drip. No redness of the oropharynx.  RESPIRATORY:  + cough and wheezing   CARDIOVASCULAR: No chest pain. No orthopnea. No palpitations. No syncope.  GASTROINTESTINAL: No nausea, no vomiting or diarrhea. No abdominal pain. No melena or hematochezia.  GENITOURINARY: No dysuria or hematuria.  ENDOCRINE: No polyuria or nocturia. No heat or cold intolerance.  HEMATOLOGY: No anemia. No bruising. No bleeding.  INTEGUMENTARY: No rashes. No lesions.  MUSCULOSKELETAL: No arthritis. No swelling. No gout.  NEUROLOGIC: No numbness, tingling, or ataxia. No seizure-type activity.  PSYCHIATRIC: No anxiety. No insomnia. No ADD.    Vitals:   Filed Vitals:   07/14/14 1714 07/14/14 1724 07/14/14 2357 07/15/14 0817  BP:  106/41 142/46 136/56  Pulse:  111 98 84  Temp:  98.9 F (37.2 C) 97.5 F (36.4 C) 97.5 F (36.4 C)  TempSrc:  Oral Oral Oral  Resp:   18 18  Height: 4\' 11"  (1.499 m)     Weight: 66.877 kg (147 lb 7 oz)     SpO2:  99% 99% 100%    Wt Readings from Last 3 Encounters:  07/14/14 66.877 kg (147 lb 7 oz)  04/16/14 66.225 kg (146 lb)     Intake/Output Summary (Last 24 hours) at 07/15/14 1431 Last data  filed at 07/15/14 1115  Gross per 24 hour  Intake   1680 ml  Output   2000 ml  Net   -320 ml    Physical Exam:   GENERAL: Pleasant-appearing in no apparent distress.  HEAD, EYES, EARS, NOSE AND THROAT: Atraumatic, normocephalic. Extraocular muscles are intact. Pupils equal and reactive to light. Sclerae anicteric. No conjunctival injection. No oro-pharyngeal erythema.  NECK: Supple. There is no jugular venous distention. No bruits, no lymphadenopathy, no thyromegaly.  HEART:  Regular rate and rhythm, tachycardic. No murmurs, no rubs, no clicks.  LUNGS:  + bl wheezing, no ronchi ABDOMEN: Soft, flat, nontender, nondistended. Has good bowel sounds. No hepatosplenomegaly appreciated.  EXTREMITIES: No evidence of any cyanosis, clubbing, or peripheral edema.  +2 pedal and radial pulses bilaterally.  NEUROLOGIC: The patient is alert, awake, and oriented x3 with no focal motor or sensory deficits appreciated bilaterally.  SKIN: Moist and warm with no rashes appreciated.  Psych: Not anxious, depressed LN: No inguinal LN enlargement    Antibiotics   Anti-infectives    Start     Dose/Rate Route Frequency Ordered Stop   07/15/14 2200  levofloxacin (LEVAQUIN) IVPB 750 mg     750 mg 100 mL/hr over 90 Minutes Intravenous Every 24 hours 07/15/14 1231     07/14/14 2200  levofloxacin (LEVAQUIN) IVPB 750 mg  Status:  Discontinued     750 mg 100 mL/hr over 90 Minutes Intravenous Every 48 hours 07/14/14 1820 07/15/14 1231   07/14/14 1815  levofloxacin (LEVAQUIN) IVPB 750 mg  Status:  Discontinued     750 mg 100 mL/hr over 90 Minutes Intravenous Every 24 hours 07/14/14 1801 07/14/14 1820   07/14/14 1521  dextrose 5 % with cefTRIAXone (ROCEPHIN) ADS Med    Comments:  gibson, kelly: cabinet override      07/14/14 1521 07/15/14 0329   07/14/14 1500  cefTRIAXone (ROCEPHIN) 1 g in dextrose 5 % 50 mL IVPB - Premix     1 g 100 mL/hr over 30 Minutes Intravenous  Once 07/14/14 1459 07/14/14 1723   07/14/14  1500  azithromycin (ZITHROMAX) 500 mg in dextrose 5 % 250 mL IVPB     500 mg 250 mL/hr over 60 Minutes Intravenous  Once 07/14/14 1459 07/14/14 1654      Medications   Scheduled Meds: . albuterol  2.5 mg Nebulization Once  . aspirin  81 mg Oral Daily  . atorvastatin  40 mg Oral Daily  . clopidogrel  75 mg Oral Daily  . fluticasone  2 spray Each Nare Daily  . heparin  5,000 Units Subcutaneous 3 times per day  . hydrochlorothiazide  25 mg Oral Daily  . levofloxacin (LEVAQUIN) IV  750 mg Intravenous Q24H  . losartan  100 mg Oral Daily  . methylPREDNISolone (SOLU-MEDROL) injection  60 mg Intravenous Q24H  . metoprolol succinate  50 mg Oral Daily  . mometasone-formoterol  2 puff Inhalation BID  . nicotine  14 mg Transdermal Daily  . pantoprazole  40 mg Oral Daily  . tiotropium  18 mcg Inhalation Daily   Continuous Infusions: . sodium chloride 100 mL/hr at 07/15/14 0611   PRN Meds:.albuterol, loratadine   Data Review:   Micro Results Recent Results (from the past 240 hour(s))  Culture, blood (routine x 2)     Status: None (Preliminary result)   Collection Time: 07/14/14  2:13 PM  Result Value Ref Range Status   Specimen Description BLOOD  Final   Special Requests BLOOD  Final   Culture NO GROWTH < 24 HOURS  Final   Report Status PENDING  Incomplete  Culture, blood (routine x 2)     Status: None (Preliminary result)   Collection Time: 07/14/14  2:13 PM  Result Value Ref Range Status   Specimen Description BLOOD  Final   Special Requests BLOOD  Final   Culture NO GROWTH < 24 HOURS  Final   Report Status PENDING  Incomplete    Radiology Reports Dg Chest 2  View  07/14/2014   CLINICAL DATA:  Cough and fever ; shortness of breath for 3 days  EXAM: CHEST  2 VIEW  COMPARISON:  Chest radiograph March 15, 2013 and chest CT August 06, 2013  FINDINGS: There is underlying emphysematous change. There is predominantly interstitial infiltrate in the right middle lobe. There is stable  scarring in the right upper lobe. There is also slight scarring in the left base. Heart size and pulmonary vascularity are normal. There is atherosclerotic change in aorta. No adenopathy. There is degenerative change in the thoracic spine.  IMPRESSION: Predominantly interstitial pneumonitis right lower lobe. Areas of scarring in the right upper lobe and to a lesser extent left base. Underlying emphysema.   Electronically Signed   By: Lowella Grip III M.D.   On: 07/14/2014 14:52     CBC  Recent Labs Lab 07/14/14 1413 07/14/14 1819 07/15/14 0555  WBC 13.3* 9.5 7.2  HGB 10.9* 10.2* 10.3*  HCT 32.8* 29.9* 30.9*  PLT 196 181 176  MCV 90.8 91.2 91.4  MCH 30.1 31.0 30.5  MCHC 33.2 34.0 33.4  RDW 14.2 14.2 13.7  LYMPHSABS 1.2  --  0.7*  MONOABS 0.9  --  0.2  EOSABS 0.0  --  0.0  BASOSABS 0.0  --  0.0    Chemistries   Recent Labs Lab 07/14/14 1413 07/14/14 1819 07/15/14 0555  NA 127*  --  133*  K 3.0*  --  3.5  CL 91*  --  101  CO2 26  --  25  GLUCOSE 118*  --  158*  BUN 23*  --  18  CREATININE 1.25* 1.13* 0.79  CALCIUM 8.4*  --  8.2*  MG 1.7  --   --    ------------------------------------------------------------------------------------------------------------------ estimated creatinine clearance is 58.3 mL/min (by C-G formula based on Cr of 0.79). ------------------------------------------------------------------------------------------------------------------ No results for input(s): HGBA1C in the last 72 hours. ------------------------------------------------------------------------------------------------------------------ No results for input(s): CHOL, HDL, LDLCALC, TRIG, CHOLHDL, LDLDIRECT in the last 72 hours. ------------------------------------------------------------------------------------------------------------------ No results for input(s): TSH, T4TOTAL, T3FREE, THYROIDAB in the last 72 hours.  Invalid input(s):  FREET3 ------------------------------------------------------------------------------------------------------------------ No results for input(s): VITAMINB12, FOLATE, FERRITIN, TIBC, IRON, RETICCTPCT in the last 72 hours.  Coagulation profile No results for input(s): INR, PROTIME in the last 168 hours.  No results for input(s): DDIMER in the last 72 hours.  Cardiac Enzymes  Recent Labs Lab 07/14/14 1413 07/14/14 1757  TROPONINI 0.04* 0.03   ------------------------------------------------------------------------------------------------------------------ Invalid input(s): POCBNP    Assessment & Plan   Pneumonia with sepsis. Due to CAP- on iv levaqin.  Elevated troponin.   due to demanding ischemia,  continue patient on medication aspirin and Plavix, follow-up troponin level. COPD. acute on chronic COPD exasperation; patient will be on nebulizers and IV Solu-Medrol every 24 hours. Dehydration. Improved with IV fluids Hypokalemia. Status post replacement Hyponatremia. Due to dehydration not improved with IV fluids and monitor sodium level,. Anemia.Likely anemia of chronic disease into follow-up CBC and outpatient follow-up  Tobacco abuse. Patient was consult for smoking cessation by the admitting physician     Code Status Orders        Start     Ordered   07/14/14 1801  Full code   Continuous     07/14/14 1801        Disposition Plan: Home       DVT Prophylaxis Heparin   Lab Results  Component Value Date   PLT 176 07/15/2014     Time Spent in  minutes   35 minutes  Dustin Flock M.D on 07/15/2014 at 2:31 PM  Between 7am to 6pm - Pager - [phone]  After 6pm go to [email or web address] - password [email or web address] [facility]   Office  [phone]

## 2014-07-16 LAB — EXPECTORATED SPUTUM ASSESSMENT W REFEX TO RESP CULTURE

## 2014-07-16 LAB — CBC
HCT: 29.6 % — ABNORMAL LOW (ref 35.0–47.0)
Hemoglobin: 9.9 g/dL — ABNORMAL LOW (ref 12.0–16.0)
MCH: 30.3 pg (ref 26.0–34.0)
MCHC: 33.4 g/dL (ref 32.0–36.0)
MCV: 90.8 fL (ref 80.0–100.0)
PLATELETS: 213 10*3/uL (ref 150–440)
RBC: 3.26 MIL/uL — AB (ref 3.80–5.20)
RDW: 14.2 % (ref 11.5–14.5)
WBC: 10.7 10*3/uL (ref 3.6–11.0)

## 2014-07-16 LAB — BASIC METABOLIC PANEL
ANION GAP: 7 (ref 5–15)
BUN: 16 mg/dL (ref 6–20)
CHLORIDE: 99 mmol/L — AB (ref 101–111)
CO2: 26 mmol/L (ref 22–32)
Calcium: 8.1 mg/dL — ABNORMAL LOW (ref 8.9–10.3)
Creatinine, Ser: 0.84 mg/dL (ref 0.44–1.00)
GFR calc non Af Amer: >60 mL/min (ref >60)
Glucose, Bld: 126 mg/dL — ABNORMAL HIGH (ref 65–99)
Potassium: 3.3 mmol/L — ABNORMAL LOW (ref 3.5–5.1)
SODIUM: 132 mmol/L — AB (ref 135–145)

## 2014-07-16 LAB — EXPECTORATED SPUTUM ASSESSMENT W GRAM STAIN, RFLX TO RESP C

## 2014-07-16 MED ORDER — GUAIFENESIN 100 MG/5ML PO SOLN
10.0000 mL | ORAL | Status: DC | PRN
  Administered 2014-07-16 – 2014-07-17 (×5): 200 mg via ORAL
  Filled 2014-07-16 (×5): qty 10

## 2014-07-16 MED ORDER — POTASSIUM CHLORIDE CRYS ER 20 MEQ PO TBCR
40.0000 meq | EXTENDED_RELEASE_TABLET | Freq: Once | ORAL | Status: AC
  Administered 2014-07-16: 40 meq via ORAL
  Filled 2014-07-16: qty 2

## 2014-07-16 MED ORDER — ZOLPIDEM TARTRATE 5 MG PO TABS
5.0000 mg | ORAL_TABLET | Freq: Every evening | ORAL | Status: DC | PRN
  Administered 2014-07-17: 5 mg via ORAL
  Filled 2014-07-16: qty 1

## 2014-07-16 MED ORDER — NYSTATIN 100000 UNIT/ML MT SUSP
5.0000 mL | Freq: Four times a day (QID) | OROMUCOSAL | Status: DC
  Administered 2014-07-16 – 2014-07-18 (×8): 500000 [IU] via ORAL
  Filled 2014-07-16 (×8): qty 5

## 2014-07-16 NOTE — Progress Notes (Signed)
[place] at [facility]                                                                                                                                                                                            Patient Demographics   Kristi Clark, is a 64 y.o. female, DOB - June 12, 1950, VEL:[id number]  Admit date - 07/14/2014   Admitting Physician Demetrios Loll, MD  Outpatient Primary MD for the patient is Kathrine Haddock, NP    Chief Complaint  Patient presents with  . Cough  . Shortness of Breath  . Fever      Still sob, coughing, no cp, no fever  Review of Systems:   CONSTITUTIONAL: No documented fever. No fatigue, weakness. No weight gain, no weight loss.  EYES: No blurry or double vision.  ENT: No tinnitus. No postnasal drip. No redness of the oropharynx.  RESPIRATORY:  + cough and wheezing   CARDIOVASCULAR: No chest pain. No orthopnea. No palpitations. No syncope.  GASTROINTESTINAL: No nausea, no vomiting or diarrhea. No abdominal pain. No melena or hematochezia.  GENITOURINARY: No dysuria or hematuria.  ENDOCRINE: No polyuria or nocturia. No heat or cold intolerance.  HEMATOLOGY: No anemia. No bruising. No bleeding.  INTEGUMENTARY: No rashes. No lesions.  MUSCULOSKELETAL: No arthritis. No swelling. No gout.  NEUROLOGIC: No numbness, tingling, or ataxia. No seizure-type activity.  PSYCHIATRIC: No anxiety. No insomnia. No ADD.    Vitals:   Filed Vitals:   07/14/14 2357 07/15/14 0817 07/15/14 2333 07/16/14 0816  BP: 142/46 136/56 140/57 150/59  Pulse: 98 84 87 82  Temp: 97.5 F (36.4 C) 97.5 F (36.4 C) 98 F (36.7 C) 97.8 F (36.6 C)  TempSrc: Oral Oral Oral Oral  Resp: 18 18 18 18   Height:      Weight:      SpO2: 99% 100% 100% 100%    Wt Readings from Last 3 Encounters:  07/14/14 66.877 kg (147 lb 7 oz)  04/16/14 66.225 kg (146 lb)     Intake/Output Summary (Last 24 hours) at 07/16/14 1402 Last data filed at 07/16/14  1344  Gross per 24 hour  Intake   2109 ml  Output   2251 ml  Net   -142 ml    Physical Exam:   GENERAL: Pleasant-appearing in no apparent distress.  HEAD, EYES, EARS, NOSE AND THROAT: Atraumatic, normocephalic. Extraocular muscles are intact. Pupils equal and reactive to light. Sclerae anicteric. No conjunctival injection. No oro-pharyngeal erythema.  NECK: Supple. There is no jugular venous distention. No bruits, no lymphadenopathy, no thyromegaly.  HEART: Regular rate and rhythm, tachycardic. No  murmurs, no rubs, no clicks.  LUNGS:  + bl wheezing, no ronchi ABDOMEN: Soft, flat, nontender, nondistended. Has good bowel sounds. No hepatosplenomegaly appreciated.  EXTREMITIES: No evidence of any cyanosis, clubbing, or peripheral edema.  +2 pedal and radial pulses bilaterally.  NEUROLOGIC: The patient is alert, awake, and oriented x3 with no focal motor or sensory deficits appreciated bilaterally.  SKIN: Moist and warm with no rashes appreciated.  Psych: Not anxious, depressed LN: No inguinal LN enlargement    Antibiotics   Anti-infectives    Start     Dose/Rate Route Frequency Ordered Stop   07/15/14 2200  levofloxacin (LEVAQUIN) IVPB 750 mg     750 mg 100 mL/hr over 90 Minutes Intravenous Every 24 hours 07/15/14 1231     07/14/14 2200  levofloxacin (LEVAQUIN) IVPB 750 mg  Status:  Discontinued     750 mg 100 mL/hr over 90 Minutes Intravenous Every 48 hours 07/14/14 1820 07/15/14 1231   07/14/14 1815  levofloxacin (LEVAQUIN) IVPB 750 mg  Status:  Discontinued     750 mg 100 mL/hr over 90 Minutes Intravenous Every 24 hours 07/14/14 1801 07/14/14 1820   07/14/14 1521  dextrose 5 % with cefTRIAXone (ROCEPHIN) ADS Med    Comments:  gibson, kelly: cabinet override      07/14/14 1521 07/15/14 0329   07/14/14 1500  cefTRIAXone (ROCEPHIN) 1 g in dextrose 5 % 50 mL IVPB - Premix     1 g 100 mL/hr over 30 Minutes Intravenous  Once 07/14/14 1459 07/14/14 1723   07/14/14 1500  azithromycin  (ZITHROMAX) 500 mg in dextrose 5 % 250 mL IVPB     500 mg 250 mL/hr over 60 Minutes Intravenous  Once 07/14/14 1459 07/14/14 1654      Medications   Scheduled Meds: . albuterol  2.5 mg Nebulization Once  . aspirin  81 mg Oral Daily  . atorvastatin  40 mg Oral Daily  . clopidogrel  75 mg Oral Daily  . fluticasone  2 spray Each Nare Daily  . heparin  5,000 Units Subcutaneous 3 times per day  . hydrochlorothiazide  25 mg Oral Daily  . levofloxacin (LEVAQUIN) IV  750 mg Intravenous Q24H  . losartan  100 mg Oral Daily  . methylPREDNISolone (SOLU-MEDROL) injection  60 mg Intravenous Q24H  . metoprolol succinate  50 mg Oral Daily  . mometasone-formoterol  2 puff Inhalation BID  . nicotine  14 mg Transdermal Daily  . nystatin  5 mL Oral QID  . pantoprazole  40 mg Oral Daily  . tiotropium  18 mcg Inhalation Daily   Continuous Infusions: . sodium chloride 100 mL/hr at 07/16/14 1353   PRN Meds:.albuterol, guaiFENesin, loratadine, zolpidem   Data Review:   Micro Results Recent Results (from the past 240 hour(s))  Culture, blood (routine x 2)     Status: None (Preliminary result)   Collection Time: 07/14/14  2:13 PM  Result Value Ref Range Status   Specimen Description BLOOD  Final   Special Requests BLOOD  Final   Culture NO GROWTH < 24 HOURS  Final   Report Status PENDING  Incomplete  Culture, blood (routine x 2)     Status: None (Preliminary result)   Collection Time: 07/14/14  2:13 PM  Result Value Ref Range Status   Specimen Description BLOOD  Final   Special Requests BLOOD  Final   Culture NO GROWTH < 24 HOURS  Final   Report Status PENDING  Incomplete  Culture, sputum-assessment  Status: None   Collection Time: 07/15/14  4:15 PM  Result Value Ref Range Status   Specimen Description SPUTUM  Final   Special Requests NONE  Final   Sputum evaluation THIS SPECIMEN IS ACCEPTABLE FOR SPUTUM CULTURE  Final   Report Status 07/16/2014 FINAL  Final  Culture, expectorated  sputum-assessment     Status: None   Collection Time: 07/16/14  8:00 AM  Result Value Ref Range Status   Specimen Description SPUTUM  Final   Special Requests NONE  Final   Sputum evaluation THIS SPECIMEN IS ACCEPTABLE FOR SPUTUM CULTURE  Final   Report Status 07/16/2014 FINAL  Final    Radiology Reports Dg Chest 2 View  07/14/2014   CLINICAL DATA:  Cough and fever ; shortness of breath for 3 days  EXAM: CHEST  2 VIEW  COMPARISON:  Chest radiograph March 15, 2013 and chest CT August 06, 2013  FINDINGS: There is underlying emphysematous change. There is predominantly interstitial infiltrate in the right middle lobe. There is stable scarring in the right upper lobe. There is also slight scarring in the left base. Heart size and pulmonary vascularity are normal. There is atherosclerotic change in aorta. No adenopathy. There is degenerative change in the thoracic spine.  IMPRESSION: Predominantly interstitial pneumonitis right lower lobe. Areas of scarring in the right upper lobe and to a lesser extent left base. Underlying emphysema.   Electronically Signed   By: Lowella Grip III M.D.   On: 07/14/2014 14:52     CBC  Recent Labs Lab 07/14/14 1413 07/14/14 1819 07/15/14 0555 07/16/14 0659  WBC 13.3* 9.5 7.2 10.7  HGB 10.9* 10.2* 10.3* 9.9*  HCT 32.8* 29.9* 30.9* 29.6*  PLT 196 181 176 213  MCV 90.8 91.2 91.4 90.8  MCH 30.1 31.0 30.5 30.3  MCHC 33.2 34.0 33.4 33.4  RDW 14.2 14.2 13.7 14.2  LYMPHSABS 1.2  --  0.7*  --   MONOABS 0.9  --  0.2  --   EOSABS 0.0  --  0.0  --   BASOSABS 0.0  --  0.0  --     Chemistries   Recent Labs Lab 07/14/14 1413 07/14/14 1819 07/15/14 0555 07/16/14 0659  NA 127*  --  133* 132*  K 3.0*  --  3.5 3.3*  CL 91*  --  101 99*  CO2 26  --  25 26  GLUCOSE 118*  --  158* 126*  BUN 23*  --  18 16  CREATININE 1.25* 1.13* 0.79 0.84  CALCIUM 8.4*  --  8.2* 8.1*  MG 1.7  --   --   --     ------------------------------------------------------------------------------------------------------------------ estimated creatinine clearance is 55.5 mL/min (by C-G formula based on Cr of 0.84). ------------------------------------------------------------------------------------------------------------------ No results for input(s): HGBA1C in the last 72 hours. ------------------------------------------------------------------------------------------------------------------ No results for input(s): CHOL, HDL, LDLCALC, TRIG, CHOLHDL, LDLDIRECT in the last 72 hours. ------------------------------------------------------------------------------------------------------------------ No results for input(s): TSH, T4TOTAL, T3FREE, THYROIDAB in the last 72 hours.  Invalid input(s): FREET3 ------------------------------------------------------------------------------------------------------------------ No results for input(s): VITAMINB12, FOLATE, FERRITIN, TIBC, IRON, RETICCTPCT in the last 72 hours.  Coagulation profile No results for input(s): INR, PROTIME in the last 168 hours.  No results for input(s): DDIMER in the last 72 hours.  Cardiac Enzymes  Recent Labs Lab 07/14/14 1413 07/14/14 1757  TROPONINI 0.04* 0.03   ------------------------------------------------------------------------------------------------------------------ Invalid input(s): POCBNP    Assessment & Plan   Pneumonia with sepsis. Due to CAP- on iv levaqin, sputum culture growing anything yet.  One more day of IV antibiotics likely switch to oral tomorrow Elevated troponin.   due to demanding ischemia,  continue patient on medication aspirin and Plavix, no cardiac symptoms. COPD. acute on chronic COPD exasperation; continue nebulizers and IV Solu-Medrol every 24 hours. We will change to by mouth prednisone tomorrow Dehydration. Resolved with IV fluids, stop IV fluids Hypokalemia. Status post replacement murmur  dose of potassium and check a BMP in a.m. Hyponatremia. Due to dehydration not improved with IV fluids and monitor sodium level,. Anemia.Likely anemia of chronic disease into follow-up CBC and outpatient follow-up  Tobacco abuse. Patient was consult for smoking cessation by the admitting physician     Code Status Orders        Start     Ordered   07/14/14 1801  Full code   Continuous     07/14/14 1801        Disposition Plan: Home       DVT Prophylaxis Heparin   Lab Results  Component Value Date   PLT 213 07/16/2014     Time Spent in minutes   35 minutes  Dustin Flock M.D on 07/16/2014 at 2:02 PM  Between 7am to 6pm - Pager - [phone]  After 6pm go to [email or web address] - password [email or web address] [facility]   Office  [phone]

## 2014-07-17 LAB — BASIC METABOLIC PANEL
ANION GAP: 5 (ref 5–15)
BUN: 16 mg/dL (ref 6–20)
CALCIUM: 8.7 mg/dL — AB (ref 8.9–10.3)
CHLORIDE: 97 mmol/L — AB (ref 101–111)
CO2: 29 mmol/L (ref 22–32)
Creatinine, Ser: 0.8 mg/dL (ref 0.44–1.00)
GFR calc Af Amer: >60 mL/min (ref >60)
GFR calc non Af Amer: >60 mL/min (ref >60)
GLUCOSE: 102 mg/dL — AB (ref 65–99)
Potassium: 3.7 mmol/L (ref 3.5–5.1)
Sodium: 131 mmol/L — ABNORMAL LOW (ref 135–145)

## 2014-07-17 LAB — MISC LABCORP TEST (SEND OUT): Labcorp test code: 182246

## 2014-07-17 MED ORDER — ACETAMINOPHEN 325 MG PO TABS: 650.0000 mg | ORAL_TABLET | Freq: Four times a day (QID) | ORAL | Status: DC | PRN

## 2014-07-17 MED ORDER — ACETAMINOPHEN 325 MG PO TABS
650.0000 mg | ORAL_TABLET | Freq: Four times a day (QID) | ORAL | Status: DC | PRN
  Administered 2014-07-17: 650 mg via ORAL
  Filled 2014-07-17: qty 2

## 2014-07-17 MED ORDER — PREDNISONE 50 MG PO TABS
50.0000 mg | ORAL_TABLET | Freq: Every day | ORAL | Status: DC
  Administered 2014-07-18: 50 mg via ORAL
  Filled 2014-07-17: qty 1

## 2014-07-17 NOTE — Progress Notes (Signed)
[place] at [facility]                                                                                                                                                                                            Patient Demographics   Kristi Clark, is a 64 y.o. female, DOB - 1950/12/15, QIH:[id number]  Admit date - 07/14/2014   Admitting Physician Demetrios Loll, MD  Outpatient Primary MD for the patient is Kathrine Haddock, NP    Chief Complaint  Patient presents with  . Cough  . Shortness of Breath  . Fever      Still sob, coughing, on O2NC 1-2 L.  Review of Systems:   CONSTITUTIONAL: No documented fever. has fatigue, weakness. No weight gain, no weight loss.  EYES: No blurry or double vision.  ENT: No tinnitus. No postnasal drip. No redness of the oropharynx.  RESPIRATORY:  + cough and SOB, but no wheezing   CARDIOVASCULAR: No chest pain. No orthopnea. No palpitations. No syncope.  GASTROINTESTINAL: No nausea, no vomiting or diarrhea. No abdominal pain. No melena or hematochezia.  GENITOURINARY: No dysuria or hematuria.  ENDOCRINE: No polyuria or nocturia. No heat or cold intolerance.  HEMATOLOGY: No anemia. No bruising. No bleeding.  INTEGUMENTARY: No rashes. No lesions.  MUSCULOSKELETAL: No arthritis. No swelling. No gout.  NEUROLOGIC: No numbness, tingling, or ataxia. No seizure-type activity.  PSYCHIATRIC: No anxiety. No insomnia. No ADD.    Vitals:   Filed Vitals:   07/16/14 0816 07/16/14 1714 07/17/14 0010 07/17/14 0802  BP: 150/59 151/67 161/68 161/55  Pulse: 82 82 102 77  Temp: 97.8 F (36.6 C) 97.7 F (36.5 C) 97.7 F (36.5 C) 97.6 F (36.4 C)  TempSrc: Oral Oral Oral Oral  Resp: 18 19 20 17   Height:      Weight:      SpO2: 100% 100% 97% 100%    Wt Readings from Last 3 Encounters:  07/14/14 66.877 kg (147 lb 7 oz)  04/16/14 66.225 kg (146 lb)     Intake/Output Summary (Last 24 hours) at 07/17/14 1442 Last data filed  at 07/17/14 1220  Gross per 24 hour  Intake   1247 ml  Output   2452 ml  Net  -1205 ml    Physical Exam:   GENERAL: Pleasant-appearing in no apparent distress.  HEAD, EYES, EARS, NOSE AND THROAT: Atraumatic, normocephalic. Extraocular muscles are intact. Pupils equal and reactive to light. Sclerae anicteric. No conjunctival injection. No oro-pharyngeal erythema.  NECK: Supple. There is no jugular venous distention. No bruits, no lymphadenopathy, no thyromegaly.  HEART: Regular rate and rhythm,  tachycardic. No murmurs, no rubs, no clicks.  LUNGS:  Bilateral air entry, no wheezing or crackles, no ronchi ABDOMEN: Soft, flat, nontender, nondistended. Has good bowel sounds. No hepatosplenomegaly appreciated.  EXTREMITIES: No evidence of any cyanosis, clubbing, or peripheral edema.  +2 pedal and radial pulses bilaterally.  NEUROLOGIC: The patient is alert, awake, and oriented x3 with no focal motor or sensory deficits appreciated bilaterally.  SKIN: Moist and warm with no rashes appreciated.  Psych: Not anxious, depressed LN: No inguinal LN enlargement    Antibiotics   Anti-infectives    Start     Dose/Rate Route Frequency Ordered Stop   07/15/14 2200  levofloxacin (LEVAQUIN) IVPB 750 mg     750 mg 100 mL/hr over 90 Minutes Intravenous Every 24 hours 07/15/14 1231     07/14/14 2200  levofloxacin (LEVAQUIN) IVPB 750 mg  Status:  Discontinued     750 mg 100 mL/hr over 90 Minutes Intravenous Every 48 hours 07/14/14 1820 07/15/14 1231   07/14/14 1815  levofloxacin (LEVAQUIN) IVPB 750 mg  Status:  Discontinued     750 mg 100 mL/hr over 90 Minutes Intravenous Every 24 hours 07/14/14 1801 07/14/14 1820   07/14/14 1521  dextrose 5 % with cefTRIAXone (ROCEPHIN) ADS Med    Comments:  gibson, kelly: cabinet override      07/14/14 1521 07/15/14 0329   07/14/14 1500  cefTRIAXone (ROCEPHIN) 1 g in dextrose 5 % 50 mL IVPB - Premix     1 g 100 mL/hr over 30 Minutes Intravenous  Once 07/14/14 1459  07/14/14 1723   07/14/14 1500  azithromycin (ZITHROMAX) 500 mg in dextrose 5 % 250 mL IVPB     500 mg 250 mL/hr over 60 Minutes Intravenous  Once 07/14/14 1459 07/14/14 1654      Medications   Scheduled Meds: . albuterol  2.5 mg Nebulization Once  . aspirin  81 mg Oral Daily  . atorvastatin  40 mg Oral Daily  . clopidogrel  75 mg Oral Daily  . fluticasone  2 spray Each Nare Daily  . heparin  5,000 Units Subcutaneous 3 times per day  . hydrochlorothiazide  25 mg Oral Daily  . levofloxacin (LEVAQUIN) IV  750 mg Intravenous Q24H  . losartan  100 mg Oral Daily  . methylPREDNISolone (SOLU-MEDROL) injection  60 mg Intravenous Q24H  . metoprolol succinate  50 mg Oral Daily  . mometasone-formoterol  2 puff Inhalation BID  . nicotine  14 mg Transdermal Daily  . nystatin  5 mL Oral QID  . pantoprazole  40 mg Oral Daily  . tiotropium  18 mcg Inhalation Daily   Continuous Infusions:   PRN Meds:.acetaminophen, albuterol, guaiFENesin, loratadine, zolpidem   Data Review:   Micro Results Recent Results (from the past 240 hour(s))  Culture, blood (routine x 2)     Status: None (Preliminary result)   Collection Time: 07/14/14  2:13 PM  Result Value Ref Range Status   Specimen Description BLOOD  Final   Special Requests BLOOD  Final   Culture NO GROWTH 3 DAYS  Final   Report Status PENDING  Incomplete  Culture, blood (routine x 2)     Status: None (Preliminary result)   Collection Time: 07/14/14  2:13 PM  Result Value Ref Range Status   Specimen Description BLOOD  Final   Special Requests BLOOD  Final   Culture NO GROWTH 3 DAYS  Final   Report Status PENDING  Incomplete  Culture, sputum-assessment  Status: None   Collection Time: 07/15/14  4:15 PM  Result Value Ref Range Status   Specimen Description SPUTUM  Final   Special Requests NONE  Final   Sputum evaluation THIS SPECIMEN IS ACCEPTABLE FOR SPUTUM CULTURE  Final   Report Status 07/16/2014 FINAL  Final  Culture,  respiratory (NON-Expectorated)     Status: None (Preliminary result)   Collection Time: 07/15/14  4:15 PM  Result Value Ref Range Status   Specimen Description SPUTUM  Final   Special Requests NONE Reflexed from [id number]  Final   Gram Stain   Final    MODERATE WBC SEEN MODERATE YEAST FEW GRAM NEGATIVE RODS FEW GRAM POSITIVE COCCI IN CHAINS EXCELLENT SPECIMEN - 90-100% WBCS    Culture   Final    HEAVY GROWTH YEAST IDENTIFICATION TO FOLLOW ONCE ISOLATED   Report Status PENDING  Incomplete  Culture, expectorated sputum-assessment     Status: None   Collection Time: 07/16/14  8:00 AM  Result Value Ref Range Status   Specimen Description SPUTUM  Final   Special Requests NONE  Final   Sputum evaluation THIS SPECIMEN IS ACCEPTABLE FOR SPUTUM CULTURE  Final   Report Status 07/16/2014 FINAL  Final  Culture, respiratory (NON-Expectorated)     Status: None (Preliminary result)   Collection Time: 07/16/14  8:00 AM  Result Value Ref Range Status   Specimen Description SPUTUM  Final   Special Requests NONE Reflexed from [id number]  Final   Gram Stain   Final    MODERATE WBC SEEN MODERATE YEAST FEW GRAM POSITIVE COCCI IN CLUSTERS GOOD SPECIMEN - 80-90% WBCS    Culture MODERATE GROWTH YEAST IDENTIFICATION TO FOLLOW   Final   Report Status PENDING  Incomplete    Radiology Reports Dg Chest 2 View  07/14/2014   CLINICAL DATA:  Cough and fever ; shortness of breath for 3 days  EXAM: CHEST  2 VIEW  COMPARISON:  Chest radiograph March 15, 2013 and chest CT August 06, 2013  FINDINGS: There is underlying emphysematous change. There is predominantly interstitial infiltrate in the right middle lobe. There is stable scarring in the right upper lobe. There is also slight scarring in the left base. Heart size and pulmonary vascularity are normal. There is atherosclerotic change in aorta. No adenopathy. There is degenerative change in the thoracic spine.  IMPRESSION: Predominantly interstitial pneumonitis right  lower lobe. Areas of scarring in the right upper lobe and to a lesser extent left base. Underlying emphysema.   Electronically Signed   By: Lowella Grip III M.D.   On: 07/14/2014 14:52     CBC  Recent Labs Lab 07/14/14 1413 07/14/14 1819 07/15/14 0555 07/16/14 0659  WBC 13.3* 9.5 7.2 10.7  HGB 10.9* 10.2* 10.3* 9.9*  HCT 32.8* 29.9* 30.9* 29.6*  PLT 196 181 176 213  MCV 90.8 91.2 91.4 90.8  MCH 30.1 31.0 30.5 30.3  MCHC 33.2 34.0 33.4 33.4  RDW 14.2 14.2 13.7 14.2  LYMPHSABS 1.2  --  0.7*  --   MONOABS 0.9  --  0.2  --   EOSABS 0.0  --  0.0  --   BASOSABS 0.0  --  0.0  --     Chemistries   Recent Labs Lab 07/14/14 1413 07/14/14 1819 07/15/14 0555 07/16/14 0659 07/17/14 0635  NA 127*  --  133* 132* 131*  K 3.0*  --  3.5 3.3* 3.7  CL 91*  --  101 99* 97*  CO2 26  --  25 26 29   GLUCOSE 118*  --  158* 126* 102*  BUN 23*  --  18 16 16   CREATININE 1.25* 1.13* 0.79 0.84 0.80  CALCIUM 8.4*  --  8.2* 8.1* 8.7*  MG 1.7  --   --   --   --    ------------------------------------------------------------------------------------------------------------------ estimated creatinine clearance is 58.3 mL/min (by C-G formula based on Cr of 0.8). ------------------------------------------------------------------------------------------------------------------ No results for input(s): HGBA1C in the last 72 hours. ------------------------------------------------------------------------------------------------------------------ No results for input(s): CHOL, HDL, LDLCALC, TRIG, CHOLHDL, LDLDIRECT in the last 72 hours. ------------------------------------------------------------------------------------------------------------------ No results for input(s): TSH, T4TOTAL, T3FREE, THYROIDAB in the last 72 hours.  Invalid input(s): FREET3 ------------------------------------------------------------------------------------------------------------------ No results for input(s):  VITAMINB12, FOLATE, FERRITIN, TIBC, IRON, RETICCTPCT in the last 72 hours.  Coagulation profile No results for input(s): INR, PROTIME in the last 168 hours.  No results for input(s): DDIMER in the last 72 hours.  Cardiac Enzymes  Recent Labs Lab 07/14/14 1413 07/14/14 1757  TROPONINI 0.04* 0.03   ------------------------------------------------------------------------------------------------------------------ Invalid input(s): POCBNP    Assessment & Plan   Pneumonia with sepsis. Due to CAP- on iv levaqin, sputum culture growing anything yet. One more day of IV antibiotics likely switch to oral tomorrow Elevated troponin.   due to demanding ischemia,  continue patient on medication aspirin and Plavix, no cardiac symptoms. COPD. acute on chronic COPD exasperation; continue nebulizers, d/c IV Solu-Medrol andchange to by mouth prednisone. Try to wean off O2. Dehydration. Resolved with IV fluids. Hypokalemia. Status post replacement, improved. Hyponatremia. Stable. Anemia.Likely anemia of chronic disease into follow-up CBC and outpatient follow-up  Tobacco abuse. Patient was consult for smoking cessation by the admitting physician     Code Status Orders        Start     Ordered   07/14/14 1801  Full code   Continuous     07/14/14 1801       D/w pt's FM.  Disposition Plan: Home       DVT Prophylaxis Heparin   Lab Results  Component Value Date   PLT 213 07/16/2014     Time Spent in minutes   39 minutes  Demetrios Loll M.D on 07/17/2014 at 2:42 PM  Between 7am to 6pm - Pager - [phone]  After 6pm go to [email or web address] - password [email or web address] [facility]   Office  [phone]

## 2014-07-18 MED ORDER — PREDNISONE 10 MG PO TABS: 40.0000 mg | ORAL_TABLET | Freq: Every day | ORAL | Status: DC

## 2014-07-18 MED ORDER — LEVOFLOXACIN 500 MG PO TABS
500.0000 mg | ORAL_TABLET | Freq: Every day | ORAL | Status: DC
Start: 2014-07-18 — End: 2014-09-03

## 2014-07-18 NOTE — Discharge Summary (Addendum)
[place] at [place] NAME: Kristi Clark    MR#:  [id number]  DATE OF BIRTH:  March 14, 1950  DATE OF ADMISSION:  07/14/2014 ADMITTING PHYSICIAN: Demetrios Loll, MD  DATE OF DISCHARGE: 07/18/2014  1:41 PM  PRIMARY CARE PHYSICIAN: Kathrine Haddock, NP    ADMISSION DIAGNOSIS:  Community acquired pneumonia [J18.9]   DISCHARGE DIAGNOSIS:  Pneumonia with sepsis. Elevated troponin. COPD.  Dehydration. Hypokalemia. Hyponatremia.  Anemia. Tobacco abuse.   SECONDARY DIAGNOSIS:   Past Medical History  Diagnosis Date  . Menopause   . Depression   . Hyperlipidemia   . Tobacco abuse   . Osteopenia     HOSPITAL COURSE:  Kristi Clark is a 64 y.o. female with a known history of COPD, hyperlipidemia and depression. Patient has had a cough and the sputum for the past 4 days. The patient also complains of fever, chills and shortness of breath. She has a generalized weakness and poor oral intake. Her temperature was 102 ED. She also has a tachycardia with a heart rate at 140s. Chest x-ray showed pneumonia. She was treated with the Zithromax and Rocephin in the ED.  Pneumonia with sepsis. Symptoms has improved. The patient has been on iv levaqin, will switch to oral for 7 days after discharge. Elevated troponin. due to demanding ischemia, continue patient on medication aspirin and Plavix, no cardiac symptoms. COPD. acute on chronic COPD exasberation; continue nebulizers and taper prednisone.  SAT down to 86% on PT, need home O2 1-2L [place].  Dehydration. Resolved with IV fluids. Hypokalemia. Status post replacement, improved. Hyponatremia. Stable. Anemia.Likely anemia of chronic disease into follow-up CBC and outpatient follow-up  Tobacco abuse. Patient was consult for smoking cessation by me.  DISCHARGE CONDITIONS:   Stable.  CONSULTS OBTAINED:     DRUG ALLERGIES:   Allergies  Allergen Reactions  . Codeine Sulfate Nausea Only   . Doxycycline Diarrhea    Severe  . Amoxicillin Rash  . Avelox [Moxifloxacin Hcl In Nacl] Rash  . Penicillins Rash  . Sulfa Antibiotics Rash    DISCHARGE MEDICATIONS:   Discharge Medication List as of 07/18/2014 12:40 PM    START taking these medications   Details  levofloxacin (LEVAQUIN) 500 MG tablet Take 1 tablet (500 mg total) by mouth daily., Starting 07/18/2014, Until Discontinued, Print      CONTINUE these medications which have CHANGED   Details  predniSONE (DELTASONE) 10 MG tablet Take 4 tablets (40 mg total) by mouth daily with breakfast. Start 40 mg once daily, taper 10 mg every other day until finish., Starting 07/18/2014, Until Discontinued, Print      CONTINUE these medications which have NOT CHANGED   Details  albuterol (PROVENTIL HFA;VENTOLIN HFA) 108 (90 BASE) MCG/ACT inhaler Inhale 2 puffs into the lungs every 6 (six) hours as needed for wheezing or shortness of breath., Until Discontinued, Historical Med    aspirin 81 MG tablet Take 81 mg by mouth daily., Until Discontinued, Historical Med    atorvastatin (LIPITOR) 40 MG tablet Take 40 mg by mouth daily., Until Discontinued, Historical Med    clopidogrel (PLAVIX) 75 MG tablet Take 75 mg by mouth daily., Until Discontinued, Historical Med    fluticasone (FLONASE) 50 MCG/ACT nasal spray Place 2 sprays into both nostrils daily., Until Discontinued, Historical Med    Fluticasone-Salmeterol (ADVAIR) 250-50 MCG/DOSE AEPB Inhale 1 puff into the lungs 2 (two) times daily., Until Discontinued, Historical Med    hydrochlorothiazide (HYDRODIURIL) 25 MG tablet Take 25  mg by mouth daily., Until Discontinued, Historical Med    loratadine (CLARITIN) 10 MG tablet Take 10 mg by mouth daily as needed for allergies., Until Discontinued, Historical Med    losartan (COZAAR) 100 MG tablet Take 100 mg by mouth daily., Until Discontinued, Historical Med    metoprolol succinate (TOPROL-XL) 50 MG 24 hr tablet Take 50 mg by mouth  daily. Take with or immediately following a meal., Until Discontinued, Historical Med    omeprazole (PRILOSEC) 20 MG capsule Take 20 mg by mouth daily., Until Discontinued, Historical Med    tiotropium (SPIRIVA) 18 MCG inhalation capsule Place 18 mcg into inhaler and inhale daily., Until Discontinued, Historical Med         DISCHARGE INSTRUCTIONS:   Low fat, low cholesterol diet. Smoking cessation. Activity as tolerate. O2NC 1-2 L continuous, portable.   If you experience worsening of your admission symptoms, develop shortness of breath, life threatening emergency, suicidal or homicidal thoughts you must seek medical attention immediately by calling 911 or calling your MD immediately  if symptoms less severe.  You Must read complete instructions/literature along with all the possible adverse reactions/side effects for all the Medicines you take and that have been prescribed to you. Take any new Medicines after you have completely understood and accept all the possible adverse reactions/side effects.   Please note  You were cared for by a hospitalist during your hospital stay. If you have any questions about your discharge medications or the care you received while you were in the hospital after you are discharged, you can call the unit and asked to speak with the hospitalist on call if the hospitalist that took care of you is not available. Once you are discharged, your primary care physician will handle any further medical issues. Please note that NO REFILLS for any discharge medications will be authorized once you are discharged, as it is imperative that you return to your primary care physician (or establish a relationship with a primary care physician if you do not have one) for your aftercare needs so that they can reassess your need for medications and monitor your lab values.    Today   SUBJECTIVE  No complaint.    VITAL SIGNS:  Blood pressure 159/56, pulse 112, temperature  98.3 F (36.8 C), temperature source Oral, resp. rate 18, height 4\' 11"  (1.499 m), weight 66.877 kg (147 lb 7 oz), SpO2 95 %.  I/O:   Intake/Output Summary (Last 24 hours) at 07/18/14 1631 Last data filed at 07/18/14 0900  Gross per 24 hour  Intake    630 ml  Output   1300 ml  Net   -670 ml    PHYSICAL EXAMINATION:  GENERAL:  64 y.o.-year-old patient lying in the bed with no acute distress.  EYES: Pupils equal, round, reactive to light and accommodation. No scleral icterus. Extraocular muscles intact.  HEENT: Head atraumatic, normocephalic. Oropharynx and nasopharynx clear.  NECK:  Supple, no jugular venous distention. No thyroid enlargement, no tenderness.  LUNGS: Normal breath sounds bilaterally, no wheezing, rales,rhonchi or crepitation. No use of accessory muscles of respiration.  CARDIOVASCULAR: S1, S2 normal. No murmurs, rubs, or gallops.  ABDOMEN: Soft, non-tender, non-distended. Bowel sounds present. No organomegaly or mass.  EXTREMITIES: No pedal edema, cyanosis, or clubbing.  NEUROLOGIC: Cranial nerves II through XII are intact. Muscle strength 5/5 in all extremities. Sensation intact. Gait not checked.  PSYCHIATRIC: The patient is alert and oriented x 3.  SKIN: No obvious rash, lesion, or ulcer.  DATA REVIEW:   CBC  Recent Labs Lab 07/16/14 0659  WBC 10.7  HGB 9.9*  HCT 29.6*  PLT 213    Chemistries   Recent Labs Lab 07/14/14 1413  07/17/14 0635  NA 127*  < > 131*  K 3.0*  < > 3.7  CL 91*  < > 97*  CO2 26  < > 29  GLUCOSE 118*  < > 102*  BUN 23*  < > 16  CREATININE 1.25*  < > 0.80  CALCIUM 8.4*  < > 8.7*  MG 1.7  --   --   < > = values in this interval not displayed.  Cardiac Enzymes  Recent Labs Lab 07/14/14 1757  TROPONINI 0.03    Microbiology Results  Results for orders placed or performed during the hospital encounter of 07/14/14  Culture, blood (routine x 2)     Status: None (Preliminary result)   Collection Time: 07/14/14  2:13 PM   Result Value Ref Range Status   Specimen Description BLOOD  Final   Special Requests BLOOD  Final   Culture NO GROWTH 4 DAYS  Final   Report Status PENDING  Incomplete  Culture, blood (routine x 2)     Status: None (Preliminary result)   Collection Time: 07/14/14  2:13 PM  Result Value Ref Range Status   Specimen Description BLOOD  Final   Special Requests BLOOD  Final   Culture NO GROWTH 4 DAYS  Final   Report Status PENDING  Incomplete  Culture, sputum-assessment     Status: None   Collection Time: 07/15/14  4:15 PM  Result Value Ref Range Status   Specimen Description SPUTUM  Final   Special Requests NONE  Final   Sputum evaluation THIS SPECIMEN IS ACCEPTABLE FOR SPUTUM CULTURE  Final   Report Status 07/16/2014 FINAL  Final  Culture, respiratory (NON-Expectorated)     Status: None (Preliminary result)   Collection Time: 07/15/14  4:15 PM  Result Value Ref Range Status   Specimen Description SPUTUM  Final   Special Requests NONE Reflexed from [id number]  Final   Gram Stain   Final    MODERATE WBC SEEN MODERATE YEAST FEW GRAM NEGATIVE RODS FEW GRAM POSITIVE COCCI IN CHAINS EXCELLENT SPECIMEN - 90-100% WBCS    Culture HEAVY GROWTH YEAST IDENTIFICATION TO FOLLOW  Final   Report Status PENDING  Incomplete  Culture, expectorated sputum-assessment     Status: None   Collection Time: 07/16/14  8:00 AM  Result Value Ref Range Status   Specimen Description SPUTUM  Final   Special Requests NONE  Final   Sputum evaluation THIS SPECIMEN IS ACCEPTABLE FOR SPUTUM CULTURE  Final   Report Status 07/16/2014 FINAL  Final  Culture, respiratory (NON-Expectorated)     Status: None (Preliminary result)   Collection Time: 07/16/14  8:00 AM  Result Value Ref Range Status   Specimen Description SPUTUM  Final   Special Requests NONE Reflexed from [id number]  Final   Gram Stain   Final    MODERATE WBC SEEN MODERATE YEAST FEW GRAM POSITIVE COCCI IN CLUSTERS GOOD SPECIMEN - 80-90% WBCS    Culture  MODERATE GROWTH YEAST IDENTIFICATION TO FOLLOW   Final   Report Status PENDING  Incomplete    RADIOLOGY:  No results found.      Management plans discussed with the patient, family and they are in agreement.  CODE STATUS:     Code Status Orders  Start     Ordered   07/14/14 1801  Full code   Continuous     07/14/14 1801      TOTAL TIME TAKING CARE OF THIS PATIENT: 48 minutes.    Demetrios Loll M.D on 07/18/2014 at 4:31 PM  Between 7am to 6pm - Pager - [phone]  After 6pm go to [email or web address] - password [email or web address] [facility]  [facility]  Office  [phone]  CC: Primary care physician; Kathrine Haddock, NP

## 2014-07-18 NOTE — Discharge Instructions (Signed)
Low fat, low cholesterol diet. Smoking cessation. Activity as tolerated. O2NC 1-2 L continuous, portable.Chronic Obstructive Pulmonary Disease Chronic obstructive pulmonary disease (COPD) is a common lung condition in which airflow from the lungs is limited. COPD is a general term that can be used to describe many different lung problems that limit airflow, including both chronic bronchitis and emphysema. If you have COPD, your lung function will probably never return to normal, but there are measures you can take to improve lung function and make yourself feel better.  CAUSES   Smoking (common).   Exposure to secondhand smoke.   Genetic problems.  Chronic inflammatory lung diseases or recurrent infections. SYMPTOMS   Shortness of breath, especially with physical activity.   Deep, persistent (chronic) cough with a large amount of thick mucus.   Wheezing.   Rapid breaths (tachypnea).   Gray or bluish discoloration (cyanosis) of the skin, especially in fingers, toes, or lips.   Fatigue.   Weight loss.   Frequent infections or episodes when breathing symptoms become much worse (exacerbations).   Chest tightness. DIAGNOSIS  Your health care provider will take a medical history and perform a physical examination to make the initial diagnosis. Additional tests for COPD may include:   Lung (pulmonary) function tests.  Chest X-ray.  CT scan.  Blood tests. TREATMENT  Treatment available to help you feel better when you have COPD includes:   Inhaler and nebulizer medicines. These help manage the symptoms of COPD and make your breathing more comfortable.  Supplemental oxygen. Supplemental oxygen is only helpful if you have a low oxygen level in your blood.   Exercise and physical activity. These are beneficial for nearly all people with COPD. Some people may also benefit from a pulmonary rehabilitation program. HOME CARE INSTRUCTIONS   Take all medicines  (inhaled or pills) as directed by your health care provider.  Avoid over-the-counter medicines or cough syrups that dry up your airway (such as antihistamines) and slow down the elimination of secretions unless instructed otherwise by your health care provider.   If you are a smoker, the most important thing that you can do is stop smoking. Continuing to smoke will cause further lung damage and breathing trouble. Ask your health care provider for help with quitting smoking. He or she can direct you to community resources or hospitals that provide support.  Avoid exposure to irritants such as smoke, chemicals, and fumes that aggravate your breathing.  Use oxygen therapy and pulmonary rehabilitation if directed by your health care provider. If you require home oxygen therapy, ask your health care provider whether you should purchase a pulse oximeter to measure your oxygen level at home.   Avoid contact with individuals who have a contagious illness.  Avoid extreme temperature and humidity changes.  Eat healthy foods. Eating smaller, more frequent meals and resting before meals may help you maintain your strength.  Stay active, but balance activity with periods of rest. Exercise and physical activity will help you maintain your ability to do things you want to do.  Preventing infection and hospitalization is very important when you have COPD. Make sure to receive all the vaccines your health care provider recommends, especially the pneumococcal and influenza vaccines. Ask your health care provider whether you need a pneumonia vaccine.  Learn and use relaxation techniques to manage stress.  Learn and use controlled breathing techniques as directed by your health care provider. Controlled breathing techniques include:   Pursed lip breathing. Start by breathing in (inhaling)  through your nose for 1 second. Then, purse your lips as if you were going to whistle and breathe out (exhale) through the  pursed lips for 2 seconds.   Diaphragmatic breathing. Start by putting one hand on your abdomen just above your waist. Inhale slowly through your nose. The hand on your abdomen should move out. Then purse your lips and exhale slowly. You should be able to feel the hand on your abdomen moving in as you exhale.   Learn and use controlled coughing to clear mucus from your lungs. Controlled coughing is a series of short, progressive coughs. The steps of controlled coughing are:  1. Lean your head slightly forward.  2. Breathe in deeply using diaphragmatic breathing.  3. Try to hold your breath for 3 seconds.  4. Keep your mouth slightly open while coughing twice.  5. Spit any mucus out into a tissue.  6. Rest and repeat the steps once or twice as needed. SEEK MEDICAL CARE IF:   You are coughing up more mucus than usual.   There is a change in the color or thickness of your mucus.   Your breathing is more labored than usual.   Your breathing is faster than usual.  SEEK IMMEDIATE MEDICAL CARE IF:   You have shortness of breath while you are resting.   You have shortness of breath that prevents you from:  Being able to talk.   Performing your usual physical activities.   You have chest pain lasting longer than 5 minutes.   Your skin color is more cyanotic than usual.  You measure low oxygen saturations for longer than 5 minutes with a pulse oximeter. MAKE SURE YOU:   Understand these instructions.  Will watch your condition.  Will get help right away if you are not doing well or get worse. Document Released: 12/01/2004 Document Revised: 07/08/2013 Document Reviewed: 10/18/2012 [facility] Patient Information 2015 [place], [place]. This information is not intended to replace advice given to you by your health care provider. Make sure you discuss any questions you have with your health care provider.

## 2014-07-18 NOTE — Progress Notes (Signed)
SATURATION QUALIFICATIONS: (This note is used to comply with regulatory documentation for home oxygen)  Patient Saturations on Room Air at Rest = 95%  Patient Saturations on Room Air while Ambulating = 86%  Patient Saturations on 1 Liters of oxygen while Ambulating = 92%  Please briefly explain why patient needs home oxygen: COPD

## 2014-07-18 NOTE — Care Management Note (Signed)
Case Management Note  Patient Details  Name: Kristi Clark MRN: [id number] Date of Birth: [birth year]  Subjective/Objective:   Order received for home oxygen. Qualifying O2 sats and chronic respiratory diagnosis noted. Spoke with pt and she is agreeable to POC. Pt reports she lives at home with her spouse. She is alert, orient,  independent and drives. Reports she is a smoker. Instructed pt on the hazards of oxygen and smoking.  Denies issues obtaining medications, copays or medical care. Reports compliance with inhalers. No additional needs identified.                Action/Plan: Home O2  Expected Discharge Date:                  Expected Discharge Plan:  Home/Self Care  In-House Referral:     Discharge planning Services  CM Consult  Post Acute Care Choice:  Durable Medical Equipment Choice offered to:  Patient  DME Arranged:  Oxygen DME Agency:  [place]:    [place] Agency:     Status of Service:  Completed, signed off  Medicare Important Message Given:    Date Medicare IM Given:  07/18/14 Medicare IM give by:  Orvan July, RN BSN Date Additional Medicare IM Given:    Additional Medicare Important Message give by:     If discussed at [place] of Stay Meetings, dates discussed:    Additional Comments:  Jolly Mango, RN 07/18/2014, 10:26 AM

## 2014-07-18 NOTE — Evaluation (Addendum)
Physical Therapy Evaluation Patient Details Name: Kristi Clark MRN: [id number] DOB: 1950-06-29 Today's Date: 07/18/2014   History of Present Illness  Pt is a 64 y.o. female admitted 07/14/14 with cough and sputum past 4 days; pt also with fever, chills, SOB and was tachy with HR in 140's in ED.  Chest x-ray shows PNA.  Pt also had increased troponin d/t demand ischemia.    Clinical Impression  Pt demonstrates steady independent ambulation without AD.  Pt scored 28/28 on Tinetti balance assessment indicating pt is at low risk for falls.  Pt's O2 did desaturate to 86% with ambulation on room air (pt required a couple minutes of purse lip breathing and rest to increase O2 back to >92%); nursing and MD Bridgett Larsson notified by PT.  Recommend pt discharge home with support of family when medically appropriate.  No skilled PT needs identified (pt appears to be at baseline level of functional mobility).  Will complete PT order.    Follow Up Recommendations No PT follow up    Equipment Recommendations  None recommended by PT    Recommendations for Other Services       Precautions / Restrictions Precautions Precaution Comments: Monitor O2 Restrictions Weight Bearing Restrictions: No      Mobility  Bed Mobility Overal bed mobility: Independent                Transfers Overall transfer level: Independent Equipment used: None                Ambulation/Gait Ambulation/Gait assistance: Independent (assist only to monitor O2) Ambulation Distance (Feet):  (120 feet x2) Assistive device: None Gait Pattern/deviations: WFL(Within Functional Limits) (mild increased lateral sway but decreased with distance ambulated)     General Gait Details: assist to monitor O2  Stairs  Pt navigate up/down 2 stairs with hand hold assist safely.          Wheelchair Mobility    Modified Rankin (Stroke Patients Only)       Balance Overall balance assessment: Independent                                Standardized Balance Assessment Standardized Balance Assessment :  (Tinetti balance assessment: 28/28 score)           Pertinent Vitals/Pain Pain Assessment: No/denies pain  Vitals: at rest HR 102 bpm; O2 93% on room air; after activity HR 112 bpm; O2 86% on room air (increased back to >92% with rest and vc's for PLB'ing); MD Bridgett Larsson and nursing notified.     Home Living Family/patient expects to be discharged to:: Private residence Living Arrangements: Spouse/significant other Available Help at Discharge: Family   Home Access: Stairs to enter Entrance Stairs-Rails: None (holds onto family member as needed) Technical brewer of Steps: 2  Home Layout: One level Home Equipment: [place] - single point;Walker - 2 wheels;Bedside commode;Shower seat (3ww)      Prior Function Level of Independence: Independent               Hand Dominance        Extremity/Trunk Assessment   Upper Extremity Assessment: Overall WFL for tasks assessed           Lower Extremity Assessment: Overall WFL for tasks assessed         Communication   Communication: No difficulties  Cognition Arousal/Alertness: Awake/alert Behavior During Therapy: WFL for tasks assessed/performed Overall Cognitive Status: Within  Functional Limits for tasks assessed                      General Comments      Exercises        Assessment/Plan    PT Assessment Patent does not need any further PT services  PT Diagnosis     PT Problem List    PT Treatment Interventions     PT Goals (Current goals can be found in the Care Plan section) Acute Rehab PT Goals Patient Stated Goal: To go home PT Goal Formulation: With patient Time For Goal Achievement: 08/09/2014 Potential to Achieve Goals: Good    Frequency     Barriers to discharge        Co-evaluation               End of Session Equipment Utilized During Treatment: Gait belt Activity Tolerance:  (Pt  with mild SOB with exertion with O2 desaturation to 86%) Patient left: in bed;with call bell/phone within reach Nurse Communication: Mobility status (O2 desaturation with ambulation)         Time: 7416-3845 PT Time Calculation (min) (ACUTE ONLY): 15 min   Charges:   PT Evaluation $Initial PT Evaluation Tier I: 1 Procedure     PT G CodesLeitha Bleak 09-Aug-2014, 9:48 AM Leitha Bleak, PT [phone]

## 2014-07-19 LAB — CULTURE, BLOOD (ROUTINE X 2)
Culture: NO GROWTH
Culture: NO GROWTH

## 2014-07-19 LAB — CULTURE, RESPIRATORY

## 2014-07-19 LAB — CULTURE, RESPIRATORY W GRAM STAIN

## 2014-07-20 LAB — CULTURE, RESPIRATORY W GRAM STAIN

## 2014-07-20 LAB — CULTURE, RESPIRATORY

## 2014-07-25 DIAGNOSIS — J189 Pneumonia, unspecified organism: Secondary | ICD-10-CM | POA: Diagnosis not present

## 2014-07-25 DIAGNOSIS — J449 Chronic obstructive pulmonary disease, unspecified: Secondary | ICD-10-CM | POA: Diagnosis not present

## 2014-07-25 DIAGNOSIS — D649 Anemia, unspecified: Secondary | ICD-10-CM | POA: Diagnosis not present

## 2014-07-25 DIAGNOSIS — B370 Candidal stomatitis: Secondary | ICD-10-CM | POA: Diagnosis not present

## 2014-07-29 ENCOUNTER — Emergency Department
Admission: EM | Admit: 2014-07-29 | Discharge: 2014-07-29 | Disposition: A | Discharge status: Home / Self Care | Payer: Medicare Other | Attending: Emergency Medicine | Admitting: Emergency Medicine

## 2014-07-29 ENCOUNTER — Encounter: Payer: Self-pay | Admitting: Urgent Care

## 2014-07-29 ENCOUNTER — Emergency Department: Payer: Medicare Other

## 2014-07-29 DIAGNOSIS — R51 Headache: Secondary | ICD-10-CM | POA: Insufficient documentation

## 2014-07-29 DIAGNOSIS — R519 Headache, unspecified: Secondary | ICD-10-CM

## 2014-07-29 DIAGNOSIS — Z79899 Other long term (current) drug therapy: Secondary | ICD-10-CM | POA: Insufficient documentation

## 2014-07-29 DIAGNOSIS — Z7951 Long term (current) use of inhaled steroids: Secondary | ICD-10-CM | POA: Insufficient documentation

## 2014-07-29 DIAGNOSIS — Z720 Tobacco use: Secondary | ICD-10-CM | POA: Diagnosis not present

## 2014-07-29 DIAGNOSIS — Z7902 Long term (current) use of antithrombotics/antiplatelets: Secondary | ICD-10-CM | POA: Diagnosis not present

## 2014-07-29 MED ORDER — ACETAMINOPHEN 500 MG PO TABS
1000.0000 mg | ORAL_TABLET | Freq: Once | ORAL | Status: AC
  Administered 2014-07-29: 1000 mg via ORAL

## 2014-07-29 MED ORDER — BUTALBITAL-APAP-CAFFEINE 50-325-40 MG PO TABS: 1.0000 | ORAL_TABLET | Freq: Four times a day (QID) | ORAL | Status: DC | PRN

## 2014-07-29 MED ORDER — ACETAMINOPHEN 500 MG PO TABS
ORAL_TABLET | ORAL | Status: AC
  Filled 2014-07-29: qty 2

## 2014-07-29 NOTE — Discharge Instructions (Signed)
General Headache Without Cause A general headache is pain or discomfort felt around the head or neck area. The cause may not be found.  HOME CARE   Keep all doctor visits.  Only take medicines as told by your doctor.  Lie down in a dark, quiet room when you have a headache.  Keep a journal to find out if certain things bring on headaches. For example, write down:  What you eat and drink.  How much sleep you get.  Any change to your diet or medicines.  Relax by getting a massage or doing other relaxing activities.  Put ice or heat packs on the head and neck area as told by your doctor.  Lessen stress.  Sit up straight. Do not tighten (tense) your muscles.  Quit smoking if you smoke.  Lessen how much alcohol you drink.  Lessen how much caffeine you drink, or stop drinking caffeine.  Eat and sleep on a regular schedule.  Get 7 to 9 hours of sleep, or as told by your doctor.  Keep lights dim if bright lights bother you or make your headaches worse. GET HELP RIGHT AWAY IF:   Your headache becomes really bad.  You have a fever.  You have a stiff neck.  You have trouble seeing.  Your muscles are weak, or you lose muscle control.  You lose your balance or have trouble walking.  You feel like you will pass out (faint), or you pass out.  You have really bad symptoms that are different than your first symptoms.  You have problems with the medicines given to you by your doctor.  Your medicines do not work.  Your headache feels different than the other headaches.  You feel sick to your stomach (nauseous) or throw up (vomit). MAKE SURE YOU:   Understand these instructions.  Will watch your condition.  Will get help right away if you are not doing well or get worse. Document Released: 12/01/2007 Document Revised: 05/16/2011 Document Reviewed: 02/11/2011 [facility] Patient Information 2015 [place], [place]. This information is not intended to replace advice given to  you by your health care provider. Make sure you discuss any questions you have with your health care provider.     As we discussed please follow your primary care doctor says possible further workup/evaluation. Please obtain more sleep, you may try an over-the-counter sleep aid if needed. Please limit caffeine intake after 3 PM, attempted to go to bed at the same time every night. You have been prescribed a headache medication, please take as needed, as written. Return to the emergency department for any personally concerning symptoms.

## 2014-07-29 NOTE — ED Notes (Addendum)
Patient presents with c/o a RIGHT frontal headache x 3 days - states, "It is the worst headache of my life." Patient presented to [facility] for evaluation and was sent her for eval. Of note, patient was admitted from 5/9-5/13 - "I cold [place] quit caffeine and nicotine when I went home." Patient also reports that she has not slept well since discharge from the hospital.

## 2014-07-29 NOTE — ED Provider Notes (Signed)
[facility] Emergency Department Provider Note  Time seen: 8:32 PM  I have reviewed the triage vital signs and the nursing notes.   HISTORY  Chief Complaint Headache    HPI Kristi Clark is a 64 y.o. female with a past medical history of depression, osteopenia, recent admission for pneumonia presents the emergency department with headaches. According to the patient she was discharged 07/18/14 after a short admission for pneumonia. Since going home the patient has cold [place] quit nicotine and caffeine. She has noted for the last 3-4 days she has been developing a headache in the mornings, was seems to improve during the daytime with Tylenol. Today the headache improved but started to come back so she came to the emergency department for further workup/evaluation. Patient denies any fever, neck pain or stiffness. The patient does not since going home she has had very limited sleepily sleeps in 2-3 hours stretches. Patient has started back up on some caffeine use but states it is very small amount compared to what she was using. Patient describes the headache as global, moderate, dull/aching.     Past Medical History  Diagnosis Date  . Menopause   . Depression   . Hyperlipidemia   . Tobacco abuse   . Osteopenia     Patient Active Problem List   Diagnosis Date Noted  . Community acquired pneumonia 07/14/2014  . Sepsis 07/14/2014  . Anemia 07/14/2014  . Pneumonia 07/14/2014    Past Surgical History  Procedure Laterality Date  . Abdominal hysterectomy    . Cholecystectomy  2014    Current Outpatient Rx  Name  Route  Sig  Dispense  Refill  . albuterol (PROVENTIL HFA;VENTOLIN HFA) 108 (90 BASE) MCG/ACT inhaler   Inhalation   Inhale 2 puffs into the lungs every 6 (six) hours as needed for wheezing or shortness of breath.         Marland Kitchen aspirin 81 MG tablet   Oral   Take 81 mg by mouth daily.         Marland Kitchen atorvastatin (LIPITOR) 40 MG tablet   Oral   Take 40 mg by mouth daily.         . clopidogrel (PLAVIX) 75 MG tablet   Oral   Take 75 mg by mouth daily.         . fluticasone (FLONASE) 50 MCG/ACT nasal spray   Each Nare   Place 2 sprays into both nostrils daily.         . Fluticasone-Salmeterol (ADVAIR) 250-50 MCG/DOSE AEPB   Inhalation   Inhale 1 puff into the lungs 2 (two) times daily.         . hydrochlorothiazide (HYDRODIURIL) 25 MG tablet   Oral   Take 25 mg by mouth daily.         Marland Kitchen levofloxacin (LEVAQUIN) 500 MG tablet   Oral   Take 1 tablet (500 mg total) by mouth daily.   7 tablet   0   . loratadine (CLARITIN) 10 MG tablet   Oral   Take 10 mg by mouth daily as needed for allergies.         Marland Kitchen losartan (COZAAR) 100 MG tablet   Oral   Take 100 mg by mouth daily.         . metoprolol succinate (TOPROL-XL) 50 MG 24 hr tablet   Oral   Take 50 mg by mouth daily. Take with or immediately following a meal.         .  omeprazole (PRILOSEC) 20 MG capsule   Oral   Take 20 mg by mouth daily.         . predniSONE (DELTASONE) 10 MG tablet   Oral   Take 4 tablets (40 mg total) by mouth daily with breakfast. Start 40 mg once daily, taper 10 mg every other day until finish.   40 tablet   0   . tiotropium (SPIRIVA) 18 MCG inhalation capsule   Inhalation   Place 18 mcg into inhaler and inhale daily.           Allergies Codeine sulfate; Doxycycline; Amoxicillin; Avelox; Penicillins; and Sulfa antibiotics  Family History  Problem Relation Age of Onset  . Cancer Mother   . Stroke Mother   . Heart disease Father   . Hyperlipidemia Father   . Cancer Sister   . Diabetes Sister   . Diabetes Brother   . Asthma Son   . Cancer Son     Social History History  Substance Use Topics  . Smoking status: Current Every Day Smoker -- 2.00 packs/day    Types: Cigarettes  . Smokeless tobacco: Not on file  . Alcohol Use: No    Review of Systems Constitutional: Negative for fever. Eyes: Negative  for visual changes. Cardiovascular: Negative for chest pain. Respiratory: Negative for shortness of breath. Gastrointestinal: Negative for abdominal pain Musculoskeletal: Negative for back pain. Skin: Negative for rash. Neurological: Negative for headaches, focal weakness or numbness.  10-point ROS otherwise negative.  ____________________________________________   PHYSICAL EXAM:  VITAL SIGNS: ED Triage Vitals  Enc Vitals Group     BP 07/29/14 1831 118/43 mmHg     Pulse Rate 07/29/14 1831 70     Resp 07/29/14 1831 14     Temp 07/29/14 1831 98.2 F (36.8 C)     Temp Source 07/29/14 1831 Oral     SpO2 07/29/14 1831 100 %     Weight 07/29/14 1831 142 lb (64.411 kg)     Height 07/29/14 1831 4\' 11"  (1.499 m)     Head Cir --      Peak Flow --      Pain Score 07/29/14 1832 4     Pain Loc --      Pain Edu? --      Excl. in [place]? --     Constitutional: Alert and oriented. Well appearing and in no distress. Eyes: Normal exam ENT   Mouth/Throat: Mucous membranes are moist. Cardiovascular: Normal rate, regular rhythm. No murmurs, rubs, or gallops. Respiratory: Normal respiratory effort without tachypnea nor retractions. Breath sounds are clear Gastrointestinal: Soft and nontender. No distention.   Musculoskeletal: Nontender with normal range of motion in all extremities Neurologic:  Normal speech and language. No gross focal neurologic deficits are appreciated. Speech is normal. Equal grip strengths. Skin:  Skin is warm, dry and intact.  Psychiatric: Mood and affect are normal. Speech and behavior are normal.   ____________________________________________     RADIOLOGY  CT head within normal limits.  ____________________________________________   INITIAL IMPRESSION / ASSESSMENT AND PLAN / ED COURSE  Pertinent labs & imaging results that were available during my care of the patient were reviewed by me and considered in my medical decision making (see chart for  details).  Patient with intermittent headaches for the past 3-4 days. CT is within normal limits. Patient states a minimal headache at this time. I believe the cause of the headaches is multifactorial including decreased caffeine use, nicotine cessation, decreased sleep. I  discussed with the patient importance of obtaining more sleep and offered to prescribe her a sleeping aid. The patient states she does not wish take a sleeping aid but will try to sleep more at night. I discussed with the patient possibly slightly increasing her caffeine use earlier in the daytime and decreasing caffeine use later in the day. Patient is agreeable to this plan. We will prescribe the patient Fioricet as needed. In the patient is a follow-up with her primary care doctor. Patient agreeable.  ____________________________________________   FINAL CLINICAL IMPRESSION(S) / ED DIAGNOSES  Headache   Harvest Dark, MD 07/29/14 2036

## 2014-09-03 ENCOUNTER — Encounter: Payer: Self-pay | Admitting: Unknown Physician Specialty

## 2014-09-03 ENCOUNTER — Ambulatory Visit (INDEPENDENT_AMBULATORY_CARE_PROVIDER_SITE_OTHER): Payer: Medicare Other | Admitting: Unknown Physician Specialty

## 2014-09-03 ENCOUNTER — Other Ambulatory Visit: Payer: Self-pay | Admitting: Unknown Physician Specialty

## 2014-09-03 VITALS — BP 135/76 | HR 97 | Temp 98.1°F | Ht 59.3 in | Wt 139.8 lb

## 2014-09-03 DIAGNOSIS — E785 Hyperlipidemia, unspecified: Secondary | ICD-10-CM | POA: Diagnosis not present

## 2014-09-03 DIAGNOSIS — N182 Chronic kidney disease, stage 2 (mild): Secondary | ICD-10-CM | POA: Insufficient documentation

## 2014-09-03 DIAGNOSIS — I129 Hypertensive chronic kidney disease with stage 1 through stage 4 chronic kidney disease, or unspecified chronic kidney disease: Secondary | ICD-10-CM

## 2014-09-03 DIAGNOSIS — J309 Allergic rhinitis, unspecified: Secondary | ICD-10-CM

## 2014-09-03 DIAGNOSIS — J449 Chronic obstructive pulmonary disease, unspecified: Secondary | ICD-10-CM | POA: Diagnosis not present

## 2014-09-03 DIAGNOSIS — K219 Gastro-esophageal reflux disease without esophagitis: Secondary | ICD-10-CM | POA: Insufficient documentation

## 2014-09-03 MED ORDER — LOSARTAN POTASSIUM 100 MG PO TABS: 100.0000 mg | ORAL_TABLET | Freq: Every day | ORAL | Status: DC

## 2014-09-03 MED ORDER — METOPROLOL SUCCINATE ER 50 MG PO TB24: 50.0000 mg | ORAL_TABLET | Freq: Every day | ORAL | Status: DC

## 2014-09-03 MED ORDER — HYDROCHLOROTHIAZIDE 25 MG PO TABS: 25.0000 mg | ORAL_TABLET | Freq: Every day | ORAL | Status: DC

## 2014-09-03 MED ORDER — FLUTICASONE-SALMETEROL 250-50 MCG/DOSE IN AEPB: 1.0000 | INHALATION_SPRAY | Freq: Two times a day (BID) | RESPIRATORY_TRACT | Status: DC

## 2014-09-03 MED ORDER — FLUTICASONE PROPIONATE 50 MCG/ACT NA SUSP: 2.0000 | Freq: Every day | NASAL | Status: DC

## 2014-09-03 MED ORDER — ALBUTEROL SULFATE HFA 108 (90 BASE) MCG/ACT IN AERS: 2.0000 | INHALATION_SPRAY | Freq: Four times a day (QID) | RESPIRATORY_TRACT | Status: DC | PRN

## 2014-09-03 MED ORDER — PANTOPRAZOLE SODIUM 40 MG PO TBEC: 40.0000 mg | DELAYED_RELEASE_TABLET | Freq: Every day | ORAL | Status: DC

## 2014-09-03 MED ORDER — TIOTROPIUM BROMIDE MONOHYDRATE 18 MCG IN CAPS: 18.0000 ug | ORAL_CAPSULE | Freq: Every day | RESPIRATORY_TRACT | Status: DC

## 2014-09-03 MED ORDER — CLOPIDOGREL BISULFATE 75 MG PO TABS: 75.0000 mg | ORAL_TABLET | Freq: Every day | ORAL | Status: DC

## 2014-09-03 MED ORDER — ATORVASTATIN CALCIUM 40 MG PO TABS: 40.0000 mg | ORAL_TABLET | Freq: Every day | ORAL | Status: DC

## 2014-09-03 NOTE — Assessment & Plan Note (Signed)
Stable.  Continue current medications.

## 2014-09-03 NOTE — Assessment & Plan Note (Signed)
Stable for now

## 2014-09-03 NOTE — Assessment & Plan Note (Signed)
Check CMP.  ?

## 2014-09-03 NOTE — Progress Notes (Signed)
BP 135/76 mmHg  Pulse 97  Temp(Src) 98.1 F (36.7 C)  Ht 4' 11.3" (1.506 m)  Wt 139 lb 12.8 oz (63.413 kg)  BMI 27.96 kg/m2  SpO2 96%  LMP  (LMP Unknown)   Subjective:    Patient ID: Kristi Clark, female    DOB: 05/31/1950, 64 y.o.   MRN: [id number]  HPI: Kristi Clark is a 63 y.o. female  Chief Complaint  Patient presents with  . Hypertension  . Hyperlipidemia    Relevant past medical, surgical, family and social history reviewed and updated as indicated. Interim medical history since our last visit reviewed. Allergies and medications reviewed and updated.  Hypertension This is a chronic problem. The problem is controlled. Associated symptoms include anxiety. Pertinent negatives include no blurred vision, chest pain, headaches, malaise/fatigue, neck pain, orthopnea, palpitations, peripheral edema, PND, shortness of breath or sweats. Risk factors for coronary artery disease include dyslipidemia and smoking/tobacco exposure. There are no compliance problems.  Hypertensive end-organ damage includes kidney disease.  Hyperlipidemia This is a chronic problem. The problem is controlled. Pertinent negatives include no chest pain or shortness of breath. There are no compliance problems.   Nicotine Dependence Presents for follow-up (Unable to quit smoking) visit. Her urge triggers include company of smokers.   COPD This is stable despite smoking  Review of Systems  Constitutional: Negative for malaise/fatigue.  Eyes: Negative for blurred vision.  Respiratory: Negative for shortness of breath.   Cardiovascular: Negative for chest pain, palpitations, orthopnea and PND.  Musculoskeletal: Negative for neck pain.  Neurological: Negative for headaches.    Per HPI unless specifically indicated above     Objective:    BP 135/76 mmHg  Pulse 97  Temp(Src) 98.1 F (36.7 C)  Ht 4' 11.3" (1.506 m)  Wt 139 lb 12.8 oz (63.413 kg)  BMI 27.96 kg/m2  SpO2 96%  LMP  (LMP  Unknown)  Wt Readings from Last 3 Encounters:  09/03/14 139 lb 12.8 oz (63.413 kg)  07/29/14 142 lb (64.411 kg)  07/14/14 147 lb 7 oz (66.877 kg)    Physical Exam  Constitutional: She is oriented to person, place, and time. She appears well-developed and well-nourished. No distress.  HENT:  Head: Normocephalic and atraumatic.  Eyes: Conjunctivae and lids are normal. Right eye exhibits no discharge. Left eye exhibits no discharge. No scleral icterus.  Cardiovascular: Normal rate and regular rhythm.   Pulmonary/Chest: Effort normal. No respiratory distress. She has wheezes.  Abdominal: Soft. Normal appearance and bowel sounds are normal. She exhibits no distension. There is no splenomegaly or hepatomegaly. There is no tenderness.  Musculoskeletal: Normal range of motion.  Neurological: She is alert and oriented to person, place, and time.  Skin: Skin is intact. No rash noted. No pallor.  Psychiatric: She has a normal mood and affect. Her behavior is normal. Judgment and thought content normal.     Assessment & Plan:   Problem List Items Addressed This Visit      Respiratory   Allergic rhinitis - Primary   COPD (chronic obstructive pulmonary disease)    Stable for now      Relevant Medications   albuterol (PROVENTIL HFA;VENTOLIN HFA) 108 (90 BASE) MCG/ACT inhaler   Fluticasone-Salmeterol (ADVAIR) 250-50 MCG/DOSE AEPB   tiotropium (SPIRIVA) 18 MCG inhalation capsule     Genitourinary   CKD (chronic kidney disease), stage II    Check CMP      Benign hypertensive renal disease    Stable.  Continue  current medications      Relevant Orders   Comprehensive metabolic panel     Other   Hyperlipidemia   Relevant Medications   hydrochlorothiazide (HYDRODIURIL) 25 MG tablet   losartan (COZAAR) 100 MG tablet   metoprolol succinate (TOPROL-XL) 50 MG 24 hr tablet   Other Relevant Orders   Lipid Panel w/o Chol/HDL Ratio       Follow up plan: Return in about 6 months (around  03/05/2015) for PE.

## 2014-09-04 LAB — COMPREHENSIVE METABOLIC PANEL
ALBUMIN: 4.1 g/dL (ref 3.6–4.8)
ALK PHOS: 94 IU/L (ref 39–117)
ALT: 18 IU/L (ref 0–32)
AST: 21 IU/L (ref 0–40)
Albumin/Globulin Ratio: 1.8 (ref 1.1–2.5)
BILIRUBIN TOTAL: 0.2 mg/dL (ref 0.0–1.2)
BUN / CREAT RATIO: 16 (ref 11–26)
BUN: 16 mg/dL (ref 8–27)
CALCIUM: 9.4 mg/dL (ref 8.7–10.3)
CO2: 26 mmol/L (ref 18–29)
CREATININE: 1.01 mg/dL — AB (ref 0.57–1.00)
Chloride: 94 mmol/L — ABNORMAL LOW (ref 97–108)
GFR calc Af Amer: 68 mL/min/{1.73_m2} (ref >59)
GFR calc non Af Amer: 59 mL/min/{1.73_m2} — ABNORMAL LOW (ref >59)
Globulin, Total: 2.3 g/dL (ref 1.5–4.5)
Glucose: 108 mg/dL — ABNORMAL HIGH (ref 65–99)
Potassium: 4.2 mmol/L (ref 3.5–5.2)
SODIUM: 134 mmol/L (ref 134–144)
Total Protein: 6.4 g/dL (ref 6.0–8.5)

## 2014-09-04 LAB — LIPID PANEL W/O CHOL/HDL RATIO
CHOLESTEROL TOTAL: 152 mg/dL (ref 100–199)
HDL: 50 mg/dL (ref >39)
LDL CALC: 79 mg/dL (ref 0–99)
Triglycerides: 114 mg/dL (ref 0–149)
VLDL Cholesterol Cal: 23 mg/dL (ref 5–40)

## 2014-09-25 DIAGNOSIS — J449 Chronic obstructive pulmonary disease, unspecified: Secondary | ICD-10-CM | POA: Diagnosis not present

## 2014-09-25 DIAGNOSIS — R918 Other nonspecific abnormal finding of lung field: Secondary | ICD-10-CM | POA: Diagnosis not present

## 2014-09-25 DIAGNOSIS — R0602 Shortness of breath: Secondary | ICD-10-CM | POA: Diagnosis not present

## 2014-09-25 DIAGNOSIS — Z720 Tobacco use: Secondary | ICD-10-CM | POA: Diagnosis not present

## 2014-10-09 DIAGNOSIS — I2510 Atherosclerotic heart disease of native coronary artery without angina pectoris: Secondary | ICD-10-CM | POA: Diagnosis not present

## 2014-10-09 DIAGNOSIS — I10 Essential (primary) hypertension: Secondary | ICD-10-CM | POA: Diagnosis not present

## 2014-10-09 DIAGNOSIS — K219 Gastro-esophageal reflux disease without esophagitis: Secondary | ICD-10-CM | POA: Diagnosis not present

## 2014-10-09 DIAGNOSIS — E782 Mixed hyperlipidemia: Secondary | ICD-10-CM | POA: Diagnosis not present

## 2014-12-12 ENCOUNTER — Encounter: Payer: Self-pay | Admitting: Unknown Physician Specialty

## 2014-12-12 ENCOUNTER — Ambulatory Visit (INDEPENDENT_AMBULATORY_CARE_PROVIDER_SITE_OTHER): Payer: Medicare Other | Admitting: Unknown Physician Specialty

## 2014-12-12 VITALS — BP 145/75 | HR 76 | Temp 97.8°F | Ht 59.1 in | Wt 142.0 lb

## 2014-12-12 DIAGNOSIS — J209 Acute bronchitis, unspecified: Secondary | ICD-10-CM | POA: Diagnosis not present

## 2014-12-12 DIAGNOSIS — J42 Unspecified chronic bronchitis: Secondary | ICD-10-CM

## 2014-12-12 MED ORDER — PREDNISONE 20 MG PO TABS: 20.0000 mg | ORAL_TABLET | Freq: Every day | ORAL | Status: DC

## 2014-12-12 MED ORDER — AZITHROMYCIN 250 MG PO TABS: ORAL_TABLET | ORAL | Status: DC

## 2014-12-12 NOTE — Progress Notes (Signed)
BP 145/75 mmHg  Pulse 76  Temp(Src) 97.8 F (36.6 C)  Ht 4' 11.1" (1.501 m)  Wt 142 lb (64.411 kg)  BMI 28.59 kg/m2  SpO2 94%  LMP  (LMP Unknown)   Subjective:    Patient ID: Kristi Clark, female    DOB: June 05, 1950, 64 y.o.   MRN: [id number]  HPI: Kristi Clark is a 64 y.o. female  Chief Complaint  Patient presents with  . Nasal Congestion    pt states she has nasal drainage that makes her cough. Also states she just feels bad and weak.   HPI Comments: Pt with significant history of COPD and hospitalization who has been sick for about a week.  Husband who "gave it to me" was sent to the hospital today.    Cough The problem has been gradually worsening. The problem occurs constantly. The cough is productive of purulent sputum. Associated symptoms include myalgias, nasal congestion, postnasal drip, rhinorrhea, shortness of breath and wheezing. Pertinent negatives include no chest pain, chills, ear congestion, ear pain, fever, headaches, heartburn, hemoptysis, rash, sore throat, sweats or weight loss. Nothing aggravates the symptoms. Risk factors for lung disease include smoking/tobacco exposure. She has tried OTC cough suppressant for the symptoms. The treatment provided moderate relief. Her past medical history is significant for COPD.    Relevant past medical, surgical, family and social history reviewed and updated as indicated. Interim medical history since our last visit reviewed. Allergies and medications reviewed and updated.  Review of Systems  Constitutional: Negative for fever, chills and weight loss.  HENT: Positive for postnasal drip and rhinorrhea. Negative for ear pain and sore throat.   Respiratory: Positive for cough, shortness of breath and wheezing. Negative for hemoptysis.   Cardiovascular: Negative for chest pain.  Gastrointestinal: Negative for heartburn.  Musculoskeletal: Positive for myalgias.  Skin: Negative for rash.  Neurological: Negative  for headaches.    Per HPI unless specifically indicated above     Objective:    BP 145/75 mmHg  Pulse 76  Temp(Src) 97.8 F (36.6 C)  Ht 4' 11.1" (1.501 m)  Wt 142 lb (64.411 kg)  BMI 28.59 kg/m2  SpO2 94%  LMP  (LMP Unknown)  Wt Readings from Last 3 Encounters:  12/12/14 142 lb (64.411 kg)  09/03/14 139 lb 12.8 oz (63.413 kg)  07/29/14 142 lb (64.411 kg)    Physical Exam  Constitutional: She is oriented to person, place, and time. She appears well-developed and well-nourished. No distress.  HENT:  Head: Normocephalic and atraumatic.  Right Ear: Tympanic membrane and ear canal normal.  Left Ear: Tympanic membrane and ear canal normal.  Nose: No rhinorrhea. Right sinus exhibits maxillary sinus tenderness. Right sinus exhibits no frontal sinus tenderness. Left sinus exhibits maxillary sinus tenderness. Left sinus exhibits no frontal sinus tenderness.  Eyes: Conjunctivae and lids are normal. Right eye exhibits no discharge. Left eye exhibits no discharge. No scleral icterus.  Cardiovascular: Normal rate and regular rhythm.   Pulmonary/Chest: Effort normal. No respiratory distress. She has decreased breath sounds. She has wheezes. She has rhonchi. She has no rales.  Abdominal: Normal appearance. There is no splenomegaly or hepatomegaly.  Musculoskeletal: Normal range of motion.  Neurological: She is alert and oriented to person, place, and time.  Skin: Skin is intact. No rash noted. No pallor.  Psychiatric: She has a normal mood and affect. Her behavior is normal. Judgment and thought content normal.      Assessment & Plan:   Problem  List Items Addressed This Visit    None    Visit Diagnoses    Acute exacerbation of chronic bronchitis (HCC)    -  Primary    Relevant Medications    azithromycin (ZITHROMAX Z-PAK) 250 MG tablet    predniSONE (DELTASONE) 20 MG tablet        Follow up plan: Return if symptoms worsen or fail to improve.

## 2014-12-26 ENCOUNTER — Ambulatory Visit (INDEPENDENT_AMBULATORY_CARE_PROVIDER_SITE_OTHER): Payer: Medicare Other | Admitting: Family Medicine

## 2014-12-26 ENCOUNTER — Encounter: Payer: Self-pay | Admitting: Family Medicine

## 2014-12-26 VITALS — BP 134/70 | HR 73 | Temp 98.0°F | Ht 59.1 in | Wt 140.0 lb

## 2014-12-26 DIAGNOSIS — R300 Dysuria: Secondary | ICD-10-CM | POA: Diagnosis not present

## 2014-12-26 DIAGNOSIS — N898 Other specified noninflammatory disorders of vagina: Secondary | ICD-10-CM

## 2014-12-26 DIAGNOSIS — K649 Unspecified hemorrhoids: Secondary | ICD-10-CM

## 2014-12-26 DIAGNOSIS — N952 Postmenopausal atrophic vaginitis: Secondary | ICD-10-CM | POA: Diagnosis not present

## 2014-12-26 LAB — UA/M W/RFLX CULTURE, ROUTINE
BILIRUBIN UA: NEGATIVE
Glucose, UA: NEGATIVE
Ketones, UA: NEGATIVE
Leukocytes, UA: NEGATIVE
NITRITE UA: NEGATIVE
PH UA: 7 (ref 5.0–7.5)
Protein, UA: NEGATIVE
RBC UA: NEGATIVE
Specific Gravity, UA: 1.015 (ref 1.005–1.030)
UUROB: 0.2 mg/dL (ref 0.2–1.0)

## 2014-12-26 LAB — WET PREP FOR TRICH, YEAST, CLUE
CLUE CELL EXAM: NEGATIVE
Trichomonas Exam: NEGATIVE
YEAST EXAM: NEGATIVE

## 2014-12-26 MED ORDER — HYDROCORTISONE 2.5 % RE CREA
1.0000 | TOPICAL_CREAM | Freq: Two times a day (BID) | RECTAL | Status: DC
Start: 2014-12-26 — End: 2015-09-15

## 2014-12-26 NOTE — Progress Notes (Signed)
BP 134/70 mmHg  Pulse 73  Temp(Src) 98 F (36.7 C)  Ht 4' 11.1" (1.501 m)  Wt 140 lb (63.504 kg)  BMI 28.19 kg/m2  SpO2 98%  LMP  (LMP Unknown)   Subjective:    Patient ID: Kristi Clark, female    DOB: [birth year], 64 y.o.   MRN: [id number]  HPI: Kristi Clark is a 64 y.o. female  Chief Complaint  Patient presents with  . Urinary Tract Infection  . Hemorrhoids   URINARY SYMPTOMS Duration: about a week Dysuria: irritated up front, but not when she is peeing, burning before she pees Urinary frequency: yes Urgency: yes Small volume voids: no Symptom severity: mild Urinary incontinence: no Foul odor: yes Hematuria: no Abdominal pain: no Back pain: no Suprapubic pain/pressure: yes Flank pain: no Fever:  no Vomiting: no Status: stable Previous urinary tract infection: no Recurrent urinary tract infection: no History of sexually transmitted disease: no Vaginal discharge: yes Treatments attempted: none   Has been having some pain and itching in her bottom when she goes to the bathroom- has been bothering her for about a week and a half, has been putting vasaline on it and it has been feeling better.   Relevant past medical, surgical, family and social history reviewed and updated as indicated. Interim medical history since our last visit reviewed. Allergies and medications reviewed and updated.  Review of Systems  Constitutional: Negative.   Respiratory: Negative.   Cardiovascular: Negative.   Genitourinary: Negative.   Psychiatric/Behavioral: Negative.     Per HPI unless specifically indicated above     Objective:    BP 134/70 mmHg  Pulse 73  Temp(Src) 98 F (36.7 C)  Ht 4' 11.1" (1.501 m)  Wt 140 lb (63.504 kg)  BMI 28.19 kg/m2  SpO2 98%  LMP  (LMP Unknown)  Wt Readings from Last 3 Encounters:  12/26/14 140 lb (63.504 kg)  12/12/14 142 lb (64.411 kg)  09/03/14 139 lb 12.8 oz (63.413 kg)    Physical Exam  Constitutional: She is oriented  to person, place, and time. She appears well-developed and well-nourished. No distress.  HENT:  Head: Normocephalic and atraumatic.  Right Ear: Hearing normal.  Left Ear: Hearing normal.  Nose: Nose normal.  Eyes: Conjunctivae and lids are normal. Pupils are equal, round, and reactive to light. Right eye exhibits no discharge. Left eye exhibits no discharge. No scleral icterus.  Cardiovascular: Normal rate, regular rhythm, normal heart sounds and intact distal pulses.  Exam reveals no gallop and no friction rub.   No murmur heard. Pulmonary/Chest: Effort normal and breath sounds normal. No respiratory distress. She has no wheezes. She has no rales. She exhibits no tenderness.  Abdominal: Soft. Bowel sounds are normal. She exhibits no distension and no mass. There is no tenderness. There is no rebound and no guarding.  Genitourinary: Rectal exam shows external hemorrhoid and tenderness. Rectal exam shows no internal hemorrhoid, no fissure, no mass and anal tone normal. Guaiac negative stool. No erythema, tenderness or bleeding in the vagina. No foreign body around the vagina. No signs of injury around the vagina. Vaginal discharge found.  Slight discharge, vaginal atrophy  Musculoskeletal: Normal range of motion.  Neurological: She is alert and oriented to person, place, and time.  Skin: Skin is warm, dry and intact. No rash noted. No erythema. No pallor.  Psychiatric: She has a normal mood and affect. Her speech is normal and behavior is normal. Judgment and thought content normal. Cognition and memory  are normal.  Nursing note and vitals reviewed.   Results for orders placed or performed in visit on 09/03/14  Comprehensive metabolic panel  Result Value Ref Range   Glucose 108 (H) 65 - 99 mg/dL   BUN 16 8 - 27 mg/dL   Creatinine, Ser 1.01 (H) 0.57 - 1.00 mg/dL   GFR calc non Af Amer 59 (L) >59 mL/min/1.73   GFR calc Af Amer 68 >59 mL/min/1.73   BUN/Creatinine Ratio 16 11 - 26   Sodium  134 134 - 144 mmol/L   Potassium 4.2 3.5 - 5.2 mmol/L   Chloride 94 (L) 97 - 108 mmol/L   CO2 26 18 - 29 mmol/L   Calcium 9.4 8.7 - 10.3 mg/dL   Total Protein 6.4 6.0 - 8.5 g/dL   Albumin 4.1 3.6 - 4.8 g/dL   Globulin, Total 2.3 1.5 - 4.5 g/dL   Albumin/Globulin Ratio 1.8 1.1 - 2.5   Bilirubin Total 0.2 0.0 - 1.2 mg/dL   Alkaline Phosphatase 94 39 - 117 IU/L   AST 21 0 - 40 IU/L   ALT 18 0 - 32 IU/L  Lipid Panel w/o Chol/HDL Ratio  Result Value Ref Range   Cholesterol, Total 152 100 - 199 mg/dL   Triglycerides 114 0 - 149 mg/dL   HDL 50 >39 mg/dL   VLDL Cholesterol Cal 23 5 - 40 mg/dL   LDL Calculated 79 0 - 99 mg/dL      Assessment & Plan:   Problem List Items Addressed This Visit    None    Visit Diagnoses    Hemorrhoids, unspecified hemorrhoid type    -  Primary    Will start anusol cream. Call if not getting better or worse. Increase fiber, stool softener as needed.     Dysuria        Normal UA- wet prep checked. Possibly due to atrophic changes from menopause or from irritation from straining. Lubricants and monitor, call if not better.     Relevant Orders    UA/M w/rflx Culture, Routine    Vaginal discharge        Wet prep showed nothing abnormal. Likely due to vaginal atrophy.     Relevant Orders    WET PREP FOR [place], YEAST, CLUE    Vaginal atrophy        Information given. Will pick up vaginal moisturizer. monitor. Call if not getting better or getting worse.         Follow up plan: Return if symptoms worsen or fail to improve.

## 2014-12-26 NOTE — Patient Instructions (Addendum)
Hemorrhoids Hemorrhoids are swollen veins around the rectum or anus. There are two types of hemorrhoids:   Internal hemorrhoids. These occur in the veins just inside the rectum. They may poke through to the outside and become irritated and painful.  External hemorrhoids. These occur in the veins outside the anus and can be felt as a painful swelling or hard lump near the anus. CAUSES  Pregnancy.   Obesity.   Constipation or diarrhea.   Straining to have a bowel movement.   Sitting for long periods on the toilet.  Heavy lifting or other activity that caused you to strain.  Anal intercourse. SYMPTOMS   Pain.   Anal itching or irritation.   Rectal bleeding.   Fecal leakage.   Anal swelling.   One or more lumps around the anus.  DIAGNOSIS  Your caregiver may be able to diagnose hemorrhoids by visual examination. Other examinations or tests that may be performed include:   Examination of the rectal area with a gloved hand (digital rectal exam).   Examination of anal canal using a small tube (scope).   A blood test if you have lost a significant amount of blood.  A test to look inside the colon (sigmoidoscopy or colonoscopy). TREATMENT Most hemorrhoids can be treated at home. However, if symptoms do not seem to be getting better or if you have a lot of rectal bleeding, your caregiver may perform a procedure to help make the hemorrhoids get smaller or remove them completely. Possible treatments include:   Placing a rubber band at the base of the hemorrhoid to cut off the circulation (rubber band ligation).   Injecting a chemical to shrink the hemorrhoid (sclerotherapy).   Using a tool to burn the hemorrhoid (infrared light therapy).   Surgically removing the hemorrhoid (hemorrhoidectomy).   Stapling the hemorrhoid to block blood flow to the tissue (hemorrhoid stapling).  HOME CARE INSTRUCTIONS   Eat foods with fiber, such as whole grains, beans,  nuts, fruits, and vegetables. Ask your doctor about taking products with added fiber in them (fibersupplements).  Increase fluid intake. Drink enough water and fluids to keep your urine clear or pale yellow.   Exercise regularly.   Go to the bathroom when you have the urge to have a bowel movement. Do not wait.   Avoid straining to have bowel movements.   Keep the anal area dry and clean. Use wet toilet paper or moist towelettes after a bowel movement.   Medicated creams and suppositories may be used or applied as directed.   Only take over-the-counter or prescription medicines as directed by your caregiver.   Take warm sitz baths for 15-20 minutes, 3-4 times a day to ease pain and discomfort.   Place ice packs on the hemorrhoids if they are tender and swollen. Using ice packs between sitz baths may be helpful.   Put ice in a plastic bag.   Place a towel between your skin and the bag.   Leave the ice on for 15-20 minutes, 3-4 times a day.   Do not use a donut-shaped pillow or sit on the toilet for long periods. This increases blood pooling and pain.  SEEK MEDICAL CARE IF:  You have increasing pain and swelling that is not controlled by treatment or medicine.  You have uncontrolled bleeding.  You have difficulty or you are unable to have a bowel movement.  You have pain or inflammation outside the area of the hemorrhoids. MAKE SURE YOU:  Understand these instructions.    Will watch your condition.  Will get help right away if you are not doing well or get worse.   This information is not intended to replace advice given to you by your health care provider. Make sure you discuss any questions you have with your health care provider.   Document Released: 02/19/2000 Document Revised: 02/08/2012 Document Reviewed: 12/27/2011 Elsevier Interactive Patient Education 2016 Hymera topical solution wipes What is this medicine? WITCH HAZEL [facility]  hey zuhl) is a botanical astringent from the plant [facility]. The wipes and pads are used to relieve itching, burning, and irritation caused by hemorrhoids or bowel movements. They may also be used to clean the outer vaginal area after childbirth or the rectal area following rectal surgery. This medicine may be used for other purposes; ask your health care provider or pharmacist if you have questions. What should I tell my health care provider before I take this medicine? They need to know if you have any of these conditions: -bleeding in the treated area -an unusual or allergic reaction to witch hazel, other medicines, foods, dyes, or preservatives How should I use this medicine? This medicine is for external use only. Follow the directions on the package label or the advice of your doctor or health care professional. Do not use your medicine more often than directed. Talk to your pediatrician regarding the use of this medicine in children. While this drug may be used for children as young as 12 years for selected conditions, precautions do apply. Overdosage: If you think you have taken too much of this medicine contact a poison control center or emergency room at once. NOTE: This medicine is only for you. Do not share this medicine with others. What if I miss a dose? This does not apply; the pads or wipes may be used as needed, up to 6 times per day. What may interact with this medicine? Interactions are not expected. Do not use any other skin products on the same area of skin without asking your doctor or health care professional. This list may not describe all possible interactions. Give your health care provider a list of all the medicines, herbs, non-prescription drugs, or dietary supplements you use. Also tell them if you smoke, drink alcohol, or use illegal drugs. Some items may interact with your medicine. What should I watch for while using this medicine? Tell your doctor or  healthcare professional if your symptoms do not start to get better or if they get worse. Many pads and wipes are safe for septic and sewer system disposal. Check specific product label. What side effects may I notice from receiving this medicine? Side effects that you should report to your doctor or health care professional as soon as possible: -allergic reactions like skin rash, itching or hives, swelling of the face, lips, or tongue Side effects that usually do not require medical attention (Report these to your doctor or health care professional if they continue or are bothersome.): -mild skin dryness or irritation This list may not describe all possible side effects. Call your doctor for medical advice about side effects. You may report side effects to FDA at 1-800-FDA-1088. Where should I keep my medicine? Keep out of the reach of children. Store at room temperature between 15 and 30 degrees C (59 and 86 degrees F). Throw away any unused medicine after the expiration date. NOTE: This sheet is a summary. It may not cover all possible information. If you have questions about this  medicine, talk to your doctor, pharmacist, or health care provider.    2016, Elsevier/Gold Standard. (2009-10-22 13:23:17) Atrophic Vaginitis Atrophic vaginitis is a condition in which the tissues that line the vagina become dry and thin. This condition is most common in women who have stopped having regular menstrual periods (menopause). This usually starts when a woman is 23-81 years old. Estrogen helps to keep the vagina moist. It stimulates the vagina to produce a clear fluid that lubricates the vagina for sexual intercourse. This fluid also protects the vagina from infection. Lack of estrogen can cause the lining of the vagina to get thinner and dryer. The vagina may also shrink in size. It may become less elastic. Atrophic vaginitis tends to get worse over time as a woman's estrogen level drops. CAUSES This  condition is caused by the normal drop in estrogen that happens around the time of menopause. RISK FACTORS Certain conditions or situations may lower a woman's estrogen level, which increases her risk of atrophic vaginitis. These include:  Taking medicine that blocks estrogen.  Having ovaries removed surgically.  Being treated for cancer with X-ray treatment (radiation) or medicines (chemotherapy).  Exercising very hard and often.  Having an eating disorder (anorexia).  Giving birth or breastfeeding.  Being over the age of 59.  Smoking. SYMPTOMS Symptoms of this condition include:  Pain, soreness, or bleeding during sexual intercourse (dyspareunia).  Vaginal burning, irritation, or itching.  Pain or bleeding during a vaginal examination using a speculum (pelvic exam).  Loss of interest in sexual activity.  Having burning pain when passing urine.  Vaginal discharge that is brown or yellow. In some cases, there are no symptoms. DIAGNOSIS This condition is diagnosed with a medical history and physical exam. This will include a pelvic exam that checks whether the inside of your vagina appears pale, thin, or dry. Rarely, you may also have other tests, including:  A urine test.  A test that checks the acid balance in your vaginal fluid (acid balance test). TREATMENT Treatment for this condition may depend on the severity of your symptoms. Treatment may include:  Using an over-the-counter vaginal lubricant before you have sexual intercourse.  Using a long-acting vaginal moisturizer.  Using low-dose vaginal estrogen for moderate to severe symptoms that do not respond to other treatments. Options include creams, tablets, and inserts (vaginal rings). Before using vaginal estrogen, tell your health care provider if you have a history of:  Breast cancer.  Endometrial cancer.  Blood clots.  Taking medicines. You may be able to take a daily pill for dyspareunia. Discuss all  of the risks of this medicine with your health care provider. It is usually not recommended for women who have a family history or personal history of breast cancer. If your symptoms are very mild and you are not sexually active, you may not need treatment. HOME CARE INSTRUCTIONS  Take medicines only as directed by your health care provider. Do not use herbal or alternative medicines unless your health care provider says that you can.  Use over-the-counter creams, lubricants, or moisturizers for dryness only as directed by your health care provider.  If your atrophic vaginitis is caused by menopause, discuss all of your menopausal symptoms and treatment options with your health care provider.  Do not douche.  Do not use products that can make your vagina dry. These include:  Scented feminine sprays.  Scented tampons.  Scented soaps.  If it hurts to have sex, talk with your sexual partner. Jermyn  CARE IF:  Your discharge looks different than normal.  Your vagina has an unusual smell.  You have new symptoms.  Your symptoms do not improve with treatment.  Your symptoms get worse.   This information is not intended to replace advice given to you by your health care provider. Make sure you discuss any questions you have with your health care provider.   Document Released: 07/08/2014 Document Reviewed: 07/08/2014 Elsevier Interactive Patient Education [employer].

## 2015-02-11 ENCOUNTER — Ambulatory Visit (INDEPENDENT_AMBULATORY_CARE_PROVIDER_SITE_OTHER): Payer: Medicare Other

## 2015-02-11 DIAGNOSIS — Z23 Encounter for immunization: Secondary | ICD-10-CM

## 2015-03-06 ENCOUNTER — Encounter: Payer: Self-pay | Admitting: Unknown Physician Specialty

## 2015-03-06 ENCOUNTER — Ambulatory Visit (INDEPENDENT_AMBULATORY_CARE_PROVIDER_SITE_OTHER): Payer: Medicare Other | Admitting: Unknown Physician Specialty

## 2015-03-06 VITALS — BP 128/74 | HR 75 | Temp 97.9°F | Ht 59.0 in | Wt 138.6 lb

## 2015-03-06 DIAGNOSIS — N182 Chronic kidney disease, stage 2 (mild): Secondary | ICD-10-CM

## 2015-03-06 DIAGNOSIS — I129 Hypertensive chronic kidney disease with stage 1 through stage 4 chronic kidney disease, or unspecified chronic kidney disease: Secondary | ICD-10-CM | POA: Diagnosis not present

## 2015-03-06 DIAGNOSIS — J449 Chronic obstructive pulmonary disease, unspecified: Secondary | ICD-10-CM | POA: Diagnosis not present

## 2015-03-06 DIAGNOSIS — N189 Chronic kidney disease, unspecified: Secondary | ICD-10-CM

## 2015-03-06 DIAGNOSIS — E785 Hyperlipidemia, unspecified: Secondary | ICD-10-CM | POA: Diagnosis not present

## 2015-03-06 DIAGNOSIS — I10 Essential (primary) hypertension: Secondary | ICD-10-CM | POA: Insufficient documentation

## 2015-03-06 LAB — LIPID PANEL PICCOLO, WAIVED
CHOL/HDL RATIO PICCOLO,WAIVE: 2.6 mg/dL
Cholesterol Piccolo, Waived: 148 mg/dL (ref <200)
HDL CHOL PICCOLO, WAIVED: 58 mg/dL (ref >59)
LDL Chol Calc Piccolo Waived: 70 mg/dL (ref <100)
TRIGLYCERIDES PICCOLO,WAIVED: 100 mg/dL (ref <150)
VLDL Chol Calc Piccolo,Waive: 20 mg/dL (ref <30)

## 2015-03-06 LAB — MICROALBUMIN, URINE WAIVED
Creatinine, Urine Waived: 50 mg/dL (ref 10–300)
Microalb, Ur Waived: 10 mg/L (ref 0–19)
Microalb/Creat Ratio: <30 mg/g (ref <30)

## 2015-03-06 MED ORDER — CLOPIDOGREL BISULFATE 75 MG PO TABS: 75.0000 mg | ORAL_TABLET | Freq: Every day | ORAL | Status: DC

## 2015-03-06 MED ORDER — HYDROCHLOROTHIAZIDE 25 MG PO TABS: 25.0000 mg | ORAL_TABLET | Freq: Every day | ORAL | Status: DC

## 2015-03-06 MED ORDER — TIOTROPIUM BROMIDE MONOHYDRATE 18 MCG IN CAPS: 18.0000 ug | ORAL_CAPSULE | Freq: Every day | RESPIRATORY_TRACT | Status: DC

## 2015-03-06 MED ORDER — PANTOPRAZOLE SODIUM 40 MG PO TBEC: 40.0000 mg | DELAYED_RELEASE_TABLET | Freq: Every day | ORAL | Status: DC

## 2015-03-06 MED ORDER — FLUTICASONE-SALMETEROL 250-50 MCG/DOSE IN AEPB: 1.0000 | INHALATION_SPRAY | Freq: Two times a day (BID) | RESPIRATORY_TRACT | Status: DC

## 2015-03-06 MED ORDER — FLUTICASONE PROPIONATE 50 MCG/ACT NA SUSP: 2.0000 | Freq: Every day | NASAL | Status: DC

## 2015-03-06 MED ORDER — LOSARTAN POTASSIUM 100 MG PO TABS: 100.0000 mg | ORAL_TABLET | Freq: Every day | ORAL | Status: DC

## 2015-03-06 MED ORDER — METOPROLOL SUCCINATE ER 50 MG PO TB24: 50.0000 mg | ORAL_TABLET | Freq: Every day | ORAL | Status: DC

## 2015-03-06 MED ORDER — ATORVASTATIN CALCIUM 40 MG PO TABS: 40.0000 mg | ORAL_TABLET | Freq: Every day | ORAL | Status: DC

## 2015-03-06 NOTE — Assessment & Plan Note (Signed)
Pt needs check further refills 

## 2015-03-06 NOTE — Assessment & Plan Note (Signed)
Check CMP.  ?

## 2015-03-06 NOTE — Assessment & Plan Note (Addendum)
Stable, continue present medications. LDL is 70

## 2015-03-06 NOTE — Assessment & Plan Note (Signed)
stable °

## 2015-03-06 NOTE — Progress Notes (Signed)
+  BP 128/74 mmHg  Pulse 75  Temp(Src) 97.9 F (36.6 C)  Ht 4\' 11"  (1.499 m)  Wt 138 lb 9.6 oz (62.869 kg)  BMI 27.98 kg/m2  SpO2 94%  LMP  (LMP Unknown)   Subjective:    Patient ID: Kristi Clark, female    DOB: [birth year], 64 y.o.   MRN: [id number]  HPI: Kristi Clark is a 64 y.o. female  Chief Complaint  Patient presents with  . Hyperlipidemia  . Hypertension  . Chronic Kidney Disease   Hypertension Using medications without difficulty Average home BPs: not checking   No problems or lightheadedness No chest pain with exertion or shortness of breath No Edema   Hyperlipidemia Using medications without problems No Muscle aches  Diet compliance: "appetite comes and goes as dealing with stress" Exercise: "stays active"  COPD Stable on current meds  Relevant past medical, surgical, family and social history reviewed and updated as indicated. Interim medical history since our last visit reviewed. Allergies and medications reviewed and updated.  Review of Systems  Constitutional: Positive for fatigue.  HENT: Negative.   Eyes: Negative.   Respiratory: Negative.   Cardiovascular: Negative.   Gastrointestinal:       Stomach cramps in the AM 15 minutes after eating.  Endocrine: Negative.   Genitourinary: Negative.   Musculoskeletal: Positive for myalgias and arthralgias.  Skin: Negative.   Allergic/Immunologic: Negative.   Neurological: Negative.   Hematological: Bruises/bleeds easily.  Psychiatric/Behavioral: Negative.     Per HPI unless specifically indicated above     Objective:    BP 128/74 mmHg  Pulse 75  Temp(Src) 97.9 F (36.6 C)  Ht 4\' 11"  (1.499 m)  Wt 138 lb 9.6 oz (62.869 kg)  BMI 27.98 kg/m2  SpO2 94%  LMP  (LMP Unknown)  Wt Readings from Last 3 Encounters:  03/06/15 138 lb 9.6 oz (62.869 kg)  12/26/14 140 lb (63.504 kg)  12/12/14 142 lb (64.411 kg)    Physical Exam  Constitutional: She is oriented to person, place, and  time. She appears well-developed and well-nourished. No distress.  HENT:  Head: Normocephalic and atraumatic.  Eyes: Conjunctivae and lids are normal. Right eye exhibits no discharge. Left eye exhibits no discharge. No scleral icterus.  Neck: Normal range of motion. Neck supple. No JVD present. Carotid bruit is not present.  Cardiovascular: Normal rate, regular rhythm and normal heart sounds.   Pulmonary/Chest: Effort normal and breath sounds normal.  Abdominal: Normal appearance. There is no splenomegaly or hepatomegaly.  Musculoskeletal: Normal range of motion.  Neurological: She is alert and oriented to person, place, and time.  Skin: Skin is warm, dry and intact. No rash noted. No pallor.  Psychiatric: She has a normal mood and affect. Her behavior is normal. Judgment and thought content normal.      Assessment & Plan:   Problem List Items Addressed This Visit      Unprioritized   CKD (chronic kidney disease), stage II - Primary    Check CMP      Relevant Orders   Comprehensive metabolic panel   Uric acid   COPD (chronic obstructive pulmonary disease) ([facility]CC)    stable      Relevant Medications   fluticasone (FLONASE) 50 MCG/ACT nasal spray   Fluticasone-Salmeterol (ADVAIR) 250-50 MCG/DOSE AEPB   tiotropium (SPIRIVA) 18 MCG inhalation capsule   Hyperlipidemia    Stable, continue present medications. LDL is 70       Relevant Medications   atorvastatin (  LIPITOR) 40 MG tablet   hydrochlorothiazide (HYDRODIURIL) 25 MG tablet   losartan (COZAAR) 100 MG tablet   metoprolol succinate (TOPROL-XL) 50 MG 24 hr tablet   Other Relevant Orders   Lipid Panel Piccolo, Waived   Benign hypertension with chronic kidney disease    Pt needs check further refills       Relevant Medications   atorvastatin (LIPITOR) 40 MG tablet   hydrochlorothiazide (HYDRODIURIL) 25 MG tablet   losartan (COZAAR) 100 MG tablet   metoprolol succinate (TOPROL-XL) 50 MG 24 hr tablet   Other Relevant  Orders   Microalbumin, Urine Waived   Uric acid       Follow up plan: Return in about 6 months (around 09/04/2015) for physical.

## 2015-03-07 LAB — COMPREHENSIVE METABOLIC PANEL
A/G RATIO: 2.2 (ref 1.1–2.5)
ALBUMIN: 4.1 g/dL (ref 3.6–4.8)
ALT: 14 IU/L (ref 0–32)
AST: 21 IU/L (ref 0–40)
Alkaline Phosphatase: 76 IU/L (ref 39–117)
BILIRUBIN TOTAL: 0.4 mg/dL (ref 0.0–1.2)
BUN / CREAT RATIO: 17 (ref 11–26)
BUN: 15 mg/dL (ref 8–27)
CALCIUM: 9.4 mg/dL (ref 8.7–10.3)
CO2: 26 mmol/L (ref 18–29)
Chloride: 91 mmol/L — ABNORMAL LOW (ref 96–106)
Creatinine, Ser: 0.88 mg/dL (ref 0.57–1.00)
GFR, EST AFRICAN AMERICAN: 79 mL/min/{1.73_m2} (ref >59)
GFR, EST NON AFRICAN AMERICAN: 69 mL/min/{1.73_m2} (ref >59)
GLUCOSE: 95 mg/dL (ref 65–99)
Globulin, Total: 1.9 g/dL (ref 1.5–4.5)
Potassium: 3.7 mmol/L (ref 3.5–5.2)
Sodium: 129 mmol/L — ABNORMAL LOW (ref 134–144)
TOTAL PROTEIN: 6 g/dL (ref 6.0–8.5)

## 2015-03-07 LAB — URIC ACID: Uric Acid: 6.1 mg/dL (ref 2.5–7.1)

## 2015-03-12 DIAGNOSIS — I2510 Atherosclerotic heart disease of native coronary artery without angina pectoris: Secondary | ICD-10-CM | POA: Diagnosis not present

## 2015-03-12 DIAGNOSIS — I10 Essential (primary) hypertension: Secondary | ICD-10-CM | POA: Diagnosis not present

## 2015-03-12 DIAGNOSIS — E782 Mixed hyperlipidemia: Secondary | ICD-10-CM | POA: Diagnosis not present

## 2015-03-12 DIAGNOSIS — F17210 Nicotine dependence, cigarettes, uncomplicated: Secondary | ICD-10-CM | POA: Diagnosis not present

## 2015-03-26 DIAGNOSIS — J849 Interstitial pulmonary disease, unspecified: Secondary | ICD-10-CM | POA: Diagnosis not present

## 2015-03-26 DIAGNOSIS — R0602 Shortness of breath: Secondary | ICD-10-CM | POA: Diagnosis not present

## 2015-03-26 DIAGNOSIS — J449 Chronic obstructive pulmonary disease, unspecified: Secondary | ICD-10-CM | POA: Diagnosis not present

## 2015-08-06 ENCOUNTER — Ambulatory Visit (INDEPENDENT_AMBULATORY_CARE_PROVIDER_SITE_OTHER): Payer: Medicare Other | Admitting: Family Medicine

## 2015-08-06 ENCOUNTER — Encounter: Payer: Self-pay | Admitting: Family Medicine

## 2015-08-06 VITALS — BP 144/72 | HR 68 | Temp 97.7°F | Ht 59.0 in | Wt 140.0 lb

## 2015-08-06 DIAGNOSIS — H6122 Impacted cerumen, left ear: Secondary | ICD-10-CM

## 2015-08-06 NOTE — Progress Notes (Signed)
BP 144/72 mmHg  Pulse 68  Temp(Src) 97.7 F (36.5 C)  Ht 4\' 11"  (1.499 m)  Wt 140 lb (63.504 kg)  BMI 28.26 kg/m2  SpO2 94%  LMP  (LMP Unknown)   Subjective:    Patient ID: Kristi Clark, female    DOB: 1950/10/17, 65 y.o.   MRN: [id number]  HPI: Kristi Clark is a 65 y.o. female  Chief Complaint  Patient presents with  . "black head" in ear    left   Patient ear stopped up slightly has been ongoing for several weeks daughter has noticed blackhead-like appearance in left ear. There's been no real pain. Relevant past medical, surgical, family and social history reviewed and updated as indicated. Interim medical history since our last visit reviewed. Allergies and medications reviewed and updated.  Review of Systems  Per HPI unless specifically indicated above     Objective:    BP 144/72 mmHg  Pulse 68  Temp(Src) 97.7 F (36.5 C)  Ht 4\' 11"  (1.499 m)  Wt 140 lb (63.504 kg)  BMI 28.26 kg/m2  SpO2 94%  LMP  (LMP Unknown)  Wt Readings from Last 3 Encounters:  08/06/15 140 lb (63.504 kg)  03/06/15 138 lb 9.6 oz (62.869 kg)  12/26/14 140 lb (63.504 kg)    Physical Exam  Constitutional: She is oriented to person, place, and time. She appears well-developed and well-nourished. No distress.  HENT:  Head: Normocephalic and atraumatic.  Right Ear: Hearing and external ear normal.  Left Ear: Hearing normal.  Nose: Nose normal.  Left ear with cerumen which was removed with instruments revealing normal canal TM  Eyes: Conjunctivae and lids are normal. Right eye exhibits no discharge. Left eye exhibits no discharge. No scleral icterus.  Pulmonary/Chest: Effort normal. No respiratory distress.  Musculoskeletal: Normal range of motion.  Neurological: She is alert and oriented to person, place, and time.  Skin: Skin is intact. No rash noted.  Psychiatric: She has a normal mood and affect. Her speech is normal and behavior is normal. Judgment and thought content  normal. Cognition and memory are normal.    Results for orders placed or performed in visit on 03/06/15  Comprehensive metabolic panel  Result Value Ref Range   Glucose 95 65 - 99 mg/dL   BUN 15 8 - 27 mg/dL   Creatinine, Ser 0.88 0.57 - 1.00 mg/dL   GFR calc non Af Amer 69 >59 mL/min/1.73   GFR calc Af Amer 79 >59 mL/min/1.73   BUN/Creatinine Ratio 17 11 - 26   Sodium 129 (L) 134 - 144 mmol/L   Potassium 3.7 3.5 - 5.2 mmol/L   Chloride 91 (L) 96 - 106 mmol/L   CO2 26 18 - 29 mmol/L   Calcium 9.4 8.7 - 10.3 mg/dL   Total Protein 6.0 6.0 - 8.5 g/dL   Albumin 4.1 3.6 - 4.8 g/dL   Globulin, Total 1.9 1.5 - 4.5 g/dL   Albumin/Globulin Ratio 2.2 1.1 - 2.5   Bilirubin Total 0.4 0.0 - 1.2 mg/dL   Alkaline Phosphatase 76 39 - 117 IU/L   AST 21 0 - 40 IU/L   ALT 14 0 - 32 IU/L  Lipid Panel Piccolo, Waived  Result Value Ref Range   Cholesterol Piccolo, Waived 148 <200 mg/dL   HDL Chol Piccolo, Waived 58 >59 mg/dL   Triglycerides Piccolo,Waived 100 <150 mg/dL   Chol/HDL Ratio Piccolo,Waive 2.6 mg/dL   LDL Chol Calc Piccolo Waived 70 <100 mg/dL  VLDL Chol Calc Piccolo,Waive 20 <30 mg/dL  Microalbumin, Urine Waived  Result Value Ref Range   Microalb, Ur Waived 10 0 - 19 mg/L   Creatinine, Urine Waived 50 10 - 300 mg/dL   Microalb/Creat Ratio <30 <30 mg/g  Uric acid  Result Value Ref Range   Uric Acid 6.1 2.5 - 7.1 mg/dL      Assessment & Plan:   Problem List Items Addressed This Visit    None    Visit Diagnoses    Cerumen impaction, left    -  Primary    Removed with instruments and normal        Follow up plan: Return for As scheduled.

## 2015-09-04 ENCOUNTER — Encounter: Payer: Medicare Other | Admitting: Unknown Physician Specialty

## 2015-09-09 DIAGNOSIS — J449 Chronic obstructive pulmonary disease, unspecified: Secondary | ICD-10-CM | POA: Diagnosis not present

## 2015-09-09 DIAGNOSIS — I2510 Atherosclerotic heart disease of native coronary artery without angina pectoris: Secondary | ICD-10-CM | POA: Diagnosis not present

## 2015-09-09 DIAGNOSIS — Z9861 Coronary angioplasty status: Secondary | ICD-10-CM | POA: Diagnosis not present

## 2015-09-09 DIAGNOSIS — F17200 Nicotine dependence, unspecified, uncomplicated: Secondary | ICD-10-CM | POA: Diagnosis not present

## 2015-09-09 DIAGNOSIS — E782 Mixed hyperlipidemia: Secondary | ICD-10-CM | POA: Diagnosis not present

## 2015-09-09 DIAGNOSIS — I10 Essential (primary) hypertension: Secondary | ICD-10-CM | POA: Diagnosis not present

## 2015-09-11 DIAGNOSIS — R079 Chest pain, unspecified: Secondary | ICD-10-CM | POA: Diagnosis not present

## 2015-09-15 ENCOUNTER — Ambulatory Visit (INDEPENDENT_AMBULATORY_CARE_PROVIDER_SITE_OTHER): Payer: Medicare Other | Admitting: Unknown Physician Specialty

## 2015-09-15 ENCOUNTER — Encounter: Payer: Self-pay | Admitting: Unknown Physician Specialty

## 2015-09-15 VITALS — BP 130/67 | HR 80 | Temp 98.2°F | Ht 58.8 in | Wt 135.0 lb

## 2015-09-15 DIAGNOSIS — I129 Hypertensive chronic kidney disease with stage 1 through stage 4 chronic kidney disease, or unspecified chronic kidney disease: Secondary | ICD-10-CM

## 2015-09-15 DIAGNOSIS — E785 Hyperlipidemia, unspecified: Secondary | ICD-10-CM

## 2015-09-15 DIAGNOSIS — J449 Chronic obstructive pulmonary disease, unspecified: Secondary | ICD-10-CM | POA: Diagnosis not present

## 2015-09-15 DIAGNOSIS — Z0000 Encounter for general adult medical examination without abnormal findings: Secondary | ICD-10-CM

## 2015-09-15 DIAGNOSIS — N189 Chronic kidney disease, unspecified: Secondary | ICD-10-CM

## 2015-09-15 MED ORDER — ALBUTEROL SULFATE HFA 108 (90 BASE) MCG/ACT IN AERS: 2.0000 | INHALATION_SPRAY | Freq: Four times a day (QID) | RESPIRATORY_TRACT | Status: DC | PRN

## 2015-09-15 MED ORDER — FLUTICASONE PROPIONATE 50 MCG/ACT NA SUSP: 2.0000 | Freq: Every day | NASAL | Status: DC

## 2015-09-15 MED ORDER — PANTOPRAZOLE SODIUM 40 MG PO TBEC: 40.0000 mg | DELAYED_RELEASE_TABLET | Freq: Every day | ORAL | Status: DC

## 2015-09-15 MED ORDER — HYDROCHLOROTHIAZIDE 25 MG PO TABS: 25.0000 mg | ORAL_TABLET | Freq: Every day | ORAL | Status: DC

## 2015-09-15 MED ORDER — ATORVASTATIN CALCIUM 40 MG PO TABS: 40.0000 mg | ORAL_TABLET | Freq: Every day | ORAL | Status: DC

## 2015-09-15 MED ORDER — LOSARTAN POTASSIUM 100 MG PO TABS: 100.0000 mg | ORAL_TABLET | Freq: Every day | ORAL | Status: DC

## 2015-09-15 MED ORDER — FLUTICASONE-SALMETEROL 250-50 MCG/DOSE IN AEPB: 1.0000 | INHALATION_SPRAY | Freq: Two times a day (BID) | RESPIRATORY_TRACT | Status: DC

## 2015-09-15 MED ORDER — CLOPIDOGREL BISULFATE 75 MG PO TABS: 75.0000 mg | ORAL_TABLET | Freq: Every day | ORAL | Status: DC

## 2015-09-15 MED ORDER — METOPROLOL SUCCINATE ER 50 MG PO TB24: 50.0000 mg | ORAL_TABLET | Freq: Every day | ORAL | Status: DC

## 2015-09-15 MED ORDER — TIOTROPIUM BROMIDE MONOHYDRATE 18 MCG IN CAPS: 18.0000 ug | ORAL_CAPSULE | Freq: Every day | RESPIRATORY_TRACT | Status: DC

## 2015-09-15 NOTE — Assessment & Plan Note (Signed)
Stable, continue present medications.   

## 2015-09-15 NOTE — Assessment & Plan Note (Signed)
Check lipid panel  

## 2015-09-15 NOTE — Assessment & Plan Note (Signed)
Per pulmonary 

## 2015-09-15 NOTE — Progress Notes (Signed)
BP 130/67 mmHg  Pulse 80  Temp(Src) 98.2 F (36.8 C)  Ht 4' 10.8" (1.494 m)  Wt 135 lb (61.236 kg)  BMI 27.44 kg/m2  SpO2 95%  LMP  (LMP Unknown)   Subjective:    Patient ID: Kristi Clark, female    DOB: Nov 24, 1950, 65 y.o.   MRN: [id number]  HPI: Kristi Clark is a 65 y.o. female  Chief Complaint  Patient presents with  . Medicare Wellness  . Labs Only    Hep C order entered  . Medication Refill    pt states she needs refills on all medications   Functional Status Survey: Is the patient deaf or have difficulty hearing?: No Does the patient have difficulty seeing, even when wearing glasses/contacts?: No Does the patient have difficulty concentrating, remembering, or making decisions?: No Does the patient have difficulty walking or climbing stairs?: No Does the patient have difficulty dressing or bathing?: No Does the patient have difficulty doing errands alone such as visiting a doctor's office or shopping?: No  Social History   Social History  . Marital Status: Married    Spouse Name: N/A  . Number of Children: N/A  . Years of Education: N/A   Occupational History  . Not on file.   Social History Main Topics  . Smoking status: Current Every Day Smoker -- 1.00 packs/day    Types: Cigarettes  . Smokeless tobacco: Never Used  . Alcohol Use: No  . Drug Use: No  . Sexual Activity: No   Other Topics Concern  . Not on file   Social History Narrative   Family History  Problem Relation Age of Onset  . Cancer Mother   . Stroke Mother   . Heart disease Father   . Hyperlipidemia Father   . Cancer Sister   . Diabetes Sister   . Diabetes Brother   . Asthma Son   . Cancer Son   . Diabetes Daughter   . Hypertension Daughter   . Cancer Maternal Grandmother     gallbladder  . Diabetes Brother   . Heart disease Brother    Past Medical History  Diagnosis Date  . Menopause   . Depression   . Hyperlipidemia   . Tobacco abuse   . Osteopenia     Past Surgical History  Procedure Laterality Date  . Abdominal hysterectomy    . Cholecystectomy  2014   Functional Status Survey: Is the patient deaf or have difficulty hearing?: No Does the patient have difficulty seeing, even when wearing glasses/contacts?: No Does the patient have difficulty concentrating, remembering, or making decisions?: No Does the patient have difficulty walking or climbing stairs?: No Does the patient have difficulty dressing or bathing?: No Does the patient have difficulty doing errands alone such as visiting a doctor's office or shopping?: No  Fall Risk  09/15/2015 03/06/2015  Falls in the past year? No No   COPD Seeing pulmonary Short winded "a little bit."  Continues to smoke Using medications without problems Night time symptoms: ER visits since last visit: none Missed work or school::none Increased cough:no Using O2:no  Hypertension Using medications without difficulty Average home BPsNot checking and just had a stress test  No problems or lightheadedness No chest pain with exertion  No Edema   Hyperlipidemia Using medications without problems No Muscle aches  Diet compliance:none Exercise: none  Stomach problems Still having "attacks" with LUQ pain and BMs.  This has been going on for quite some time.  Had her gall bladder removed without any help.  States they are getting a little better.  Worse with stress.    Mini cog is negative  Relevant past medical, surgical, family and social history reviewed and updated as indicated. Interim medical history since our last visit reviewed. Allergies and medications reviewed and updated.  Review of Systems  Constitutional: Negative.   HENT: Negative.   Eyes: Negative.   Respiratory: Negative.   Cardiovascular: Negative.   Gastrointestinal: Positive for abdominal distention.  Endocrine: Negative.   Genitourinary: Negative.   Musculoskeletal: Negative.   Skin: Negative.    Allergic/Immunologic: Negative.   Neurological: Negative.   Hematological: Negative.   Psychiatric/Behavioral: Negative.     Per HPI unless specifically indicated above     Objective:    BP 130/67 mmHg  Pulse 80  Temp(Src) 98.2 F (36.8 C)  Ht 4' 10.8" (1.494 m)  Wt 135 lb (61.236 kg)  BMI 27.44 kg/m2  SpO2 95%  LMP  (LMP Unknown)  Wt Readings from Last 3 Encounters:  09/15/15 135 lb (61.236 kg)  08/06/15 140 lb (63.504 kg)  03/06/15 138 lb 9.6 oz (62.869 kg)    Physical Exam  Constitutional: She is oriented to person, place, and time. She appears well-developed and well-nourished.  HENT:  Head: Normocephalic and atraumatic.  Eyes: Pupils are equal, round, and reactive to light. Right eye exhibits no discharge. Left eye exhibits no discharge. No scleral icterus.  Neck: Normal range of motion. Neck supple. Carotid bruit is not present. No thyromegaly present.  Cardiovascular: Normal rate, regular rhythm and normal heart sounds.  Exam reveals no gallop and no friction rub.   No murmur heard. Pulmonary/Chest: Effort normal and breath sounds normal. No respiratory distress. She has no wheezes. She has no rales.  Abdominal: Soft. Bowel sounds are normal. There is no tenderness. There is no rebound.  Genitourinary: No breast swelling, tenderness or discharge.  Musculoskeletal: Normal range of motion.  Lymphadenopathy:    She has no cervical adenopathy.  Neurological: She is alert and oriented to person, place, and time.  Skin: Skin is warm, dry and intact. No rash noted.  Psychiatric: She has a normal mood and affect. Her speech is normal and behavior is normal. Judgment and thought content normal. Cognition and memory are normal.    Results for orders placed or performed in visit on 03/06/15  Comprehensive metabolic panel  Result Value Ref Range   Glucose 95 65 - 99 mg/dL   BUN 15 8 - 27 mg/dL   Creatinine, Ser 0.88 0.57 - 1.00 mg/dL   GFR calc non Af Amer 69 >59  mL/min/1.73   GFR calc Af Amer 79 >59 mL/min/1.73   BUN/Creatinine Ratio 17 11 - 26   Sodium 129 (L) 134 - 144 mmol/L   Potassium 3.7 3.5 - 5.2 mmol/L   Chloride 91 (L) 96 - 106 mmol/L   CO2 26 18 - 29 mmol/L   Calcium 9.4 8.7 - 10.3 mg/dL   Total Protein 6.0 6.0 - 8.5 g/dL   Albumin 4.1 3.6 - 4.8 g/dL   Globulin, Total 1.9 1.5 - 4.5 g/dL   Albumin/Globulin Ratio 2.2 1.1 - 2.5   Bilirubin Total 0.4 0.0 - 1.2 mg/dL   Alkaline Phosphatase 76 39 - 117 IU/L   AST 21 0 - 40 IU/L   ALT 14 0 - 32 IU/L  Lipid Panel Piccolo, Waived  Result Value Ref Range   Cholesterol Piccolo, Waived 148 <200 mg/dL  HDL Chol Piccolo, Waived 58 >59 mg/dL   Triglycerides Piccolo,Waived 100 <150 mg/dL   Chol/HDL Ratio Piccolo,Waive 2.6 mg/dL   LDL Chol Calc Piccolo Waived 70 <100 mg/dL   VLDL Chol Calc Piccolo,Waive 20 <30 mg/dL  Microalbumin, Urine Waived  Result Value Ref Range   Microalb, Ur Waived 10 0 - 19 mg/L   Creatinine, Urine Waived 50 10 - 300 mg/dL   Microalb/Creat Ratio <30 <30 mg/g  Uric acid  Result Value Ref Range   Uric Acid 6.1 2.5 - 7.1 mg/dL      Assessment & Plan:   Problem List Items Addressed This Visit      Unprioritized   Benign hypertension with chronic kidney disease    Stable, continue present medications.        Relevant Medications   metoprolol succinate (TOPROL-XL) 50 MG 24 hr tablet   losartan (COZAAR) 100 MG tablet   hydrochlorothiazide (HYDRODIURIL) 25 MG tablet   atorvastatin (LIPITOR) 40 MG tablet   Other Relevant Orders   Comprehensive metabolic panel   Lipid Panel w/o Chol/HDL Ratio   COPD (chronic obstructive pulmonary disease) (HCC)    Per pulmonary      Relevant Medications   tiotropium (SPIRIVA) 18 MCG inhalation capsule   Fluticasone-Salmeterol (ADVAIR) 250-50 MCG/DOSE AEPB   fluticasone (FLONASE) 50 MCG/ACT nasal spray   albuterol (PROVENTIL HFA;VENTOLIN HFA) 108 (90 Base) MCG/ACT inhaler   Hyperlipidemia    Check lipid panel       Relevant Medications   metoprolol succinate (TOPROL-XL) 50 MG 24 hr tablet   losartan (COZAAR) 100 MG tablet   hydrochlorothiazide (HYDRODIURIL) 25 MG tablet   atorvastatin (LIPITOR) 40 MG tablet   Other Relevant Orders   Lipid Panel w/o Chol/HDL Ratio    Other Visit Diagnoses    Health care maintenance    -  Primary    Relevant Orders    Hepatitis C antibody    Annual physical exam            Follow up plan: Return in about 6 months (around 03/17/2016).

## 2015-09-16 ENCOUNTER — Encounter: Payer: Self-pay | Admitting: Unknown Physician Specialty

## 2015-09-16 DIAGNOSIS — R918 Other nonspecific abnormal finding of lung field: Secondary | ICD-10-CM | POA: Diagnosis not present

## 2015-09-16 DIAGNOSIS — J849 Interstitial pulmonary disease, unspecified: Secondary | ICD-10-CM | POA: Diagnosis not present

## 2015-09-16 DIAGNOSIS — J449 Chronic obstructive pulmonary disease, unspecified: Secondary | ICD-10-CM | POA: Diagnosis not present

## 2015-09-16 DIAGNOSIS — R0602 Shortness of breath: Secondary | ICD-10-CM | POA: Diagnosis not present

## 2015-09-16 DIAGNOSIS — R05 Cough: Secondary | ICD-10-CM | POA: Diagnosis not present

## 2015-09-16 DIAGNOSIS — Z720 Tobacco use: Secondary | ICD-10-CM | POA: Diagnosis not present

## 2015-09-16 LAB — COMPREHENSIVE METABOLIC PANEL
ALK PHOS: 99 IU/L (ref 39–117)
ALT: 11 IU/L (ref 0–32)
AST: 16 IU/L (ref 0–40)
Albumin/Globulin Ratio: 1.4 (ref 1.2–2.2)
Albumin: 4 g/dL (ref 3.6–4.8)
BILIRUBIN TOTAL: 0.3 mg/dL (ref 0.0–1.2)
BUN/Creatinine Ratio: 20 (ref 12–28)
BUN: 16 mg/dL (ref 8–27)
CHLORIDE: 91 mmol/L — AB (ref 96–106)
CO2: 27 mmol/L (ref 18–29)
Calcium: 9.3 mg/dL (ref 8.7–10.3)
Creatinine, Ser: 0.82 mg/dL (ref 0.57–1.00)
GFR calc non Af Amer: 75 mL/min/{1.73_m2} (ref >59)
GFR, EST AFRICAN AMERICAN: 86 mL/min/{1.73_m2} (ref >59)
GLUCOSE: 93 mg/dL (ref 65–99)
Globulin, Total: 2.8 g/dL (ref 1.5–4.5)
Potassium: 3.9 mmol/L (ref 3.5–5.2)
Sodium: 136 mmol/L (ref 134–144)
Total Protein: 6.8 g/dL (ref 6.0–8.5)

## 2015-09-16 LAB — LIPID PANEL W/O CHOL/HDL RATIO
Cholesterol, Total: 140 mg/dL (ref 100–199)
HDL: 52 mg/dL (ref >39)
LDL Calculated: 46 mg/dL (ref 0–99)
Triglycerides: 209 mg/dL — ABNORMAL HIGH (ref 0–149)
VLDL Cholesterol Cal: 42 mg/dL — ABNORMAL HIGH (ref 5–40)

## 2015-09-16 LAB — HEPATITIS C ANTIBODY

## 2015-09-16 NOTE — Progress Notes (Signed)
Quick Note:  Normal labs. Patient notified by letter. ______ 

## 2015-09-18 DIAGNOSIS — R079 Chest pain, unspecified: Secondary | ICD-10-CM | POA: Diagnosis not present

## 2015-09-18 DIAGNOSIS — R300 Dysuria: Secondary | ICD-10-CM | POA: Diagnosis not present

## 2015-09-18 DIAGNOSIS — E782 Mixed hyperlipidemia: Secondary | ICD-10-CM | POA: Diagnosis not present

## 2015-09-18 DIAGNOSIS — I2510 Atherosclerotic heart disease of native coronary artery without angina pectoris: Secondary | ICD-10-CM | POA: Diagnosis not present

## 2015-09-18 DIAGNOSIS — I10 Essential (primary) hypertension: Secondary | ICD-10-CM | POA: Diagnosis not present

## 2015-09-24 DIAGNOSIS — R1030 Lower abdominal pain, unspecified: Secondary | ICD-10-CM | POA: Diagnosis not present

## 2015-11-19 ENCOUNTER — Ambulatory Visit (INDEPENDENT_AMBULATORY_CARE_PROVIDER_SITE_OTHER): Payer: Medicare Other | Admitting: Family Medicine

## 2015-11-19 ENCOUNTER — Encounter: Payer: Self-pay | Admitting: Family Medicine

## 2015-11-19 VITALS — BP 105/61 | HR 85 | Temp 98.4°F | Wt 133.0 lb

## 2015-11-19 DIAGNOSIS — J309 Allergic rhinitis, unspecified: Secondary | ICD-10-CM

## 2015-11-19 MED ORDER — AZITHROMYCIN 250 MG PO TABS: ORAL_TABLET | ORAL | 0 refills | Status: DC

## 2015-11-19 MED ORDER — MONTELUKAST SODIUM 10 MG PO TABS: 10.0000 mg | ORAL_TABLET | Freq: Every day | ORAL | 3 refills | Status: DC

## 2015-11-19 MED ORDER — CETIRIZINE HCL 10 MG PO TABS: 10.0000 mg | ORAL_TABLET | Freq: Every day | ORAL | 11 refills | Status: DC

## 2015-11-19 NOTE — Assessment & Plan Note (Signed)
Symptoms likely stemming from severe environmental allergies. Will switch her to zyrtec and singulair, d/c claritin as she has been on it many years now. Continue flonase daily. Azithromycin sent for if symptoms worsen over the next few days or she starts having fevers.

## 2015-11-19 NOTE — Progress Notes (Signed)
   BP 105/61 (BP Location: Left Arm, Patient Position: Sitting, Cuff Size: Small)   Pulse 85   Temp 98.4 F (36.9 C)   Wt 133 lb (60.3 kg) Comment: shoes  LMP  (LMP Unknown)   SpO2 97%   BMI 27.05 kg/m    Subjective:    Patient ID: Kristi Clark, female    DOB: [birth year], 65 y.o.   MRN: [id number]  HPI: Kristi Clark is a 65 y.o. female  Chief Complaint  Patient presents with  . URI    over a week   7-8 day history of thick nasal congestion and sinus pressure. Denies fever, chills, sweats. Has always had seasonal allergies, and is currently taking flonase intermittently and claritin daily, which she has been on for many years now. Not trying anything else OTC for now. Denies sick contacts.    Relevant past medical, surgical, family and social history reviewed and updated as indicated. Interim medical history since our last visit reviewed. Allergies and medications reviewed and updated.  Review of Systems  Constitutional: Negative.   HENT: Positive for congestion, rhinorrhea and sinus pressure.   Eyes: Negative.   Respiratory: Negative.   Gastrointestinal: Negative.   Genitourinary: Negative.   Musculoskeletal: Negative.   Neurological: Negative.   Psychiatric/Behavioral: Negative.     Per HPI unless specifically indicated above     Objective:    BP 105/61 (BP Location: Left Arm, Patient Position: Sitting, Cuff Size: Small)   Pulse 85   Temp 98.4 F (36.9 C)   Wt 133 lb (60.3 kg) Comment: shoes  LMP  (LMP Unknown)   SpO2 97%   BMI 27.05 kg/m   Wt Readings from Last 3 Encounters:  11/19/15 133 lb (60.3 kg)  09/15/15 135 lb (61.2 kg)  08/06/15 140 lb (63.5 kg)    Physical Exam  Constitutional: She is oriented to person, place, and time. She appears well-developed and well-nourished. No distress.  HENT:  Head: Atraumatic.  Right Ear: External ear normal.  Left Ear: External ear normal.  Mouth/Throat: Oropharynx is clear and moist. No oropharyngeal  exudate.  Nasal mucosa erythematous b/l  Eyes: Conjunctivae are normal. No scleral icterus.  Neck: Normal range of motion. Neck supple.  Cardiovascular: Normal rate.   Pulmonary/Chest: Effort normal and breath sounds normal. No respiratory distress.  Musculoskeletal: Normal range of motion.  Lymphadenopathy:    She has no cervical adenopathy.  Neurological: She is alert and oriented to person, place, and time.  Skin: Skin is warm and dry.  Psychiatric: She has a normal mood and affect. Her behavior is normal.  Nursing note and vitals reviewed.     Assessment & Plan:   Problem List Items Addressed This Visit      Respiratory   Allergic rhinitis - Primary    Symptoms likely stemming from severe environmental allergies. Will switch her to zyrtec and singulair, d/c claritin as she has been on it many years now. Continue flonase daily. Azithromycin sent for if symptoms worsen over the next few days or she starts having fevers.        Other Visit Diagnoses   None.      Follow up plan: Return if symptoms worsen or fail to improve.

## 2015-11-19 NOTE — Patient Instructions (Signed)
Follow up as needed

## 2015-12-22 DIAGNOSIS — E782 Mixed hyperlipidemia: Secondary | ICD-10-CM | POA: Diagnosis not present

## 2015-12-22 DIAGNOSIS — I2510 Atherosclerotic heart disease of native coronary artery without angina pectoris: Secondary | ICD-10-CM | POA: Diagnosis not present

## 2015-12-22 DIAGNOSIS — I10 Essential (primary) hypertension: Secondary | ICD-10-CM | POA: Diagnosis not present

## 2016-01-04 ENCOUNTER — Ambulatory Visit (INDEPENDENT_AMBULATORY_CARE_PROVIDER_SITE_OTHER): Payer: Medicare Other

## 2016-01-04 DIAGNOSIS — Z23 Encounter for immunization: Secondary | ICD-10-CM

## 2016-01-19 DIAGNOSIS — Z720 Tobacco use: Secondary | ICD-10-CM | POA: Diagnosis not present

## 2016-01-19 DIAGNOSIS — J449 Chronic obstructive pulmonary disease, unspecified: Secondary | ICD-10-CM | POA: Diagnosis not present

## 2016-01-19 DIAGNOSIS — J849 Interstitial pulmonary disease, unspecified: Secondary | ICD-10-CM | POA: Diagnosis not present

## 2016-02-18 ENCOUNTER — Ambulatory Visit (INDEPENDENT_AMBULATORY_CARE_PROVIDER_SITE_OTHER): Payer: Medicare Other | Admitting: Family Medicine

## 2016-02-18 ENCOUNTER — Encounter: Payer: Self-pay | Admitting: Family Medicine

## 2016-02-18 VITALS — BP 131/69 | HR 85 | Temp 98.5°F | Wt 128.7 lb

## 2016-02-18 DIAGNOSIS — J449 Chronic obstructive pulmonary disease, unspecified: Secondary | ICD-10-CM | POA: Diagnosis not present

## 2016-02-18 MED ORDER — BENZONATATE 100 MG PO CAPS: 200.0000 mg | ORAL_CAPSULE | Freq: Three times a day (TID) | ORAL | 0 refills | Status: DC | PRN

## 2016-02-18 MED ORDER — AZITHROMYCIN 250 MG PO TABS: ORAL_TABLET | ORAL | 0 refills | Status: DC

## 2016-02-18 NOTE — Patient Instructions (Signed)
Follow up as needed

## 2016-02-18 NOTE — Progress Notes (Signed)
   BP 131/69 (BP Location: Right Arm, Patient Position: Sitting, Cuff Size: Normal)   Pulse 85   Temp 98.5 F (36.9 C)   Wt 128 lb 11.2 oz (58.4 kg)   LMP  (LMP Unknown)   SpO2 97%   BMI 26.17 kg/m    Subjective:    Patient ID: Kristi Clark, female    DOB: [birth year], 65 y.o.   MRN: [id number]  HPI: Kristi Clark is a 65 y.o. female  Chief Complaint  Patient presents with  . URI    X 2 weeks   Patient presents with 2 week history of productive cough, fatigue, low grade fever, wheezing. Denies ear pain, sore throat, nasal congestion. Has had some sick relatives recently. Taking delsym with minimal relief. Albuterol inhaler has been helping some. Still current everyday smoker, not wanting to quit at this time. Known COPD.  Relevant past medical, surgical, family and social history reviewed and updated as indicated. Interim medical history since our last visit reviewed. Allergies and medications reviewed and updated.  Review of Systems  Constitutional: Positive for chills, fatigue and fever.  HENT: Negative.   Eyes: Negative.   Respiratory: Positive for cough and wheezing.   Gastrointestinal: Negative.   Genitourinary: Negative.   Musculoskeletal: Negative.   Neurological: Negative.   Psychiatric/Behavioral: Negative.     Per HPI unless specifically indicated above     Objective:    BP 131/69 (BP Location: Right Arm, Patient Position: Sitting, Cuff Size: Normal)   Pulse 85   Temp 98.5 F (36.9 C)   Wt 128 lb 11.2 oz (58.4 kg)   LMP  (LMP Unknown)   SpO2 97%   BMI 26.17 kg/m   Wt Readings from Last 3 Encounters:  02/18/16 128 lb 11.2 oz (58.4 kg)  11/19/15 133 lb (60.3 kg)  09/15/15 135 lb (61.2 kg)    Physical Exam  Constitutional: She is oriented to person, place, and time. She appears well-developed and well-nourished. No distress.  HENT:  Head: Atraumatic.  Right Ear: External ear normal.  Left Ear: External ear normal.  Nose: Nose normal.    Mouth/Throat: Oropharynx is clear and moist. No oropharyngeal exudate.  Eyes: Conjunctivae are normal. Pupils are equal, round, and reactive to light. No scleral icterus.  Neck: Normal range of motion. Neck supple.  Cardiovascular: Normal rate and normal heart sounds.   Pulmonary/Chest: Effort normal. No respiratory distress. She has wheezes (mild wheezes). She has no rales.  Musculoskeletal: Normal range of motion.  Lymphadenopathy:    She has no cervical adenopathy.  Neurological: She is alert and oriented to person, place, and time.  Skin: Skin is warm and dry.  Psychiatric: She has a normal mood and affect. Her behavior is normal.  Nursing note and vitals reviewed.     Assessment & Plan:   Problem List Items Addressed This Visit      Respiratory   COPD (chronic obstructive pulmonary disease) ([place]) - Primary    Acute mild exacerbation from URI. Will treat with azithromycin, albuterol prn, and tessalon perles. Follow up if no improvement       Relevant Medications   azithromycin (ZITHROMAX) 250 MG tablet   benzonatate (TESSALON) 100 MG capsule       Follow up plan: Return for as scheduled.

## 2016-02-18 NOTE — Assessment & Plan Note (Addendum)
Acute mild exacerbation from URI. Will treat with azithromycin, albuterol prn, and tessalon perles. Follow up if no improvement

## 2016-03-02 ENCOUNTER — Ambulatory Visit (INDEPENDENT_AMBULATORY_CARE_PROVIDER_SITE_OTHER): Payer: Medicare Other | Admitting: Family Medicine

## 2016-03-02 ENCOUNTER — Encounter: Payer: Self-pay | Admitting: Family Medicine

## 2016-03-02 VITALS — BP 135/71 | HR 65 | Temp 97.7°F | Wt 128.0 lb

## 2016-03-02 DIAGNOSIS — J449 Chronic obstructive pulmonary disease, unspecified: Secondary | ICD-10-CM | POA: Diagnosis not present

## 2016-03-02 MED ORDER — PREDNISONE 20 MG PO TABS: 40.0000 mg | ORAL_TABLET | Freq: Every day | ORAL | 0 refills | Status: DC

## 2016-03-02 NOTE — Progress Notes (Signed)
   BP 135/71   Pulse 65   Temp 97.7 F (36.5 C)   Wt 128 lb (58.1 kg)   LMP  (LMP Unknown)   SpO2 98%   BMI 26.03 kg/m    Subjective:    Patient ID: Kristi Clark, female    DOB: Aug 25, 1950, 65 y.o.   MRN: [id number]  HPI: Kristi Clark is a 65 y.o. female  Chief Complaint  Patient presents with  . URI    she was improving, but now feels like it's coming back in her chest, cough, runny nose. Still feels run down.   Patient presents for follow up from a URI she was recently treated for. States she is feeling a good deal better, but recently has had a lot going on with ill family members she is caring for and is now feeling very run down again. Concerned that her illness is coming back as she is beginning to have some return of runny nose and cough. Some soreness in her chest from coughing, as well as some chest tightness especially with coughing fits. Denies fever, chills, SOB, wheezing.   Relevant past medical, surgical, family and social history reviewed and updated as indicated. Interim medical history since our last visit reviewed. Allergies and medications reviewed and updated.  Review of Systems  Constitutional: Positive for fatigue.  HENT: Positive for rhinorrhea.   Eyes: Negative.   Respiratory: Positive for cough and chest tightness. Negative for shortness of breath.   Cardiovascular: Negative.   Gastrointestinal: Negative.   Genitourinary: Negative.   Musculoskeletal: Negative.   Neurological: Negative.   Psychiatric/Behavioral: Negative.     Per HPI unless specifically indicated above     Objective:    BP 135/71   Pulse 65   Temp 97.7 F (36.5 C)   Wt 128 lb (58.1 kg)   LMP  (LMP Unknown)   SpO2 98%   BMI 26.03 kg/m   Wt Readings from Last 3 Encounters:  03/02/16 128 lb (58.1 kg)  02/18/16 128 lb 11.2 oz (58.4 kg)  11/19/15 133 lb (60.3 kg)    Physical Exam  Constitutional: She is oriented to person, place, and time. She appears  well-developed and well-nourished.  HENT:  Head: Atraumatic.  Right Ear: External ear normal.  Left Ear: External ear normal.  Nose: Nose normal.  Mouth/Throat: Oropharynx is clear and moist.  Eyes: Conjunctivae are normal. Pupils are equal, round, and reactive to light.  Neck: Normal range of motion. Neck supple.  Cardiovascular: Normal heart sounds.   Pulmonary/Chest: Effort normal. No respiratory distress. She has wheezes (very mild wheezes diffusely). She has no rales.  Musculoskeletal: Normal range of motion.  Lymphadenopathy:    She has no cervical adenopathy.  Neurological: She is alert and oriented to person, place, and time.  Skin: Skin is warm and dry.  Psychiatric: She has a normal mood and affect. Her behavior is normal.  Nursing note and vitals reviewed.     Assessment & Plan:   Problem List Items Addressed This Visit      Respiratory   COPD (chronic obstructive pulmonary disease) ([place]) - Primary    Some lingering inflammation in her lungs, will try prednisone burst. Continue maintenance inhalers and albuterol as needed. Continue tessalon perles.       Relevant Medications   predniSONE (DELTASONE) 20 MG tablet       Follow up plan: Return if symptoms worsen or fail to improve.

## 2016-03-02 NOTE — Patient Instructions (Signed)
Follow up as needed

## 2016-03-02 NOTE — Assessment & Plan Note (Signed)
Some lingering inflammation in her lungs, will try prednisone burst. Continue maintenance inhalers and albuterol as needed. Continue tessalon perles.

## 2016-03-18 ENCOUNTER — Ambulatory Visit: Payer: Medicare Other | Admitting: Unknown Physician Specialty

## 2016-03-25 ENCOUNTER — Ambulatory Visit: Payer: Self-pay | Admitting: Unknown Physician Specialty

## 2016-04-01 ENCOUNTER — Encounter: Payer: Self-pay | Admitting: Unknown Physician Specialty

## 2016-04-01 ENCOUNTER — Ambulatory Visit (INDEPENDENT_AMBULATORY_CARE_PROVIDER_SITE_OTHER): Payer: Medicare Other | Admitting: Unknown Physician Specialty

## 2016-04-01 DIAGNOSIS — K219 Gastro-esophageal reflux disease without esophagitis: Secondary | ICD-10-CM

## 2016-04-01 DIAGNOSIS — F17219 Nicotine dependence, cigarettes, with unspecified nicotine-induced disorders: Secondary | ICD-10-CM

## 2016-04-01 DIAGNOSIS — E785 Hyperlipidemia, unspecified: Secondary | ICD-10-CM

## 2016-04-01 DIAGNOSIS — I129 Hypertensive chronic kidney disease with stage 1 through stage 4 chronic kidney disease, or unspecified chronic kidney disease: Secondary | ICD-10-CM

## 2016-04-01 MED ORDER — FLUTICASONE-SALMETEROL 250-50 MCG/DOSE IN AEPB: 1.0000 | INHALATION_SPRAY | Freq: Two times a day (BID) | RESPIRATORY_TRACT | 1 refills | Status: DC

## 2016-04-01 MED ORDER — TIOTROPIUM BROMIDE MONOHYDRATE 18 MCG IN CAPS: 18.0000 ug | ORAL_CAPSULE | Freq: Every day | RESPIRATORY_TRACT | 1 refills | Status: DC

## 2016-04-01 MED ORDER — PANTOPRAZOLE SODIUM 40 MG PO TBEC: 40.0000 mg | DELAYED_RELEASE_TABLET | Freq: Every day | ORAL | 1 refills | Status: DC

## 2016-04-01 MED ORDER — METOPROLOL SUCCINATE ER 50 MG PO TB24: 50.0000 mg | ORAL_TABLET | Freq: Every day | ORAL | 1 refills | Status: DC

## 2016-04-01 MED ORDER — ATORVASTATIN CALCIUM 40 MG PO TABS: 40.0000 mg | ORAL_TABLET | Freq: Every day | ORAL | 1 refills | Status: DC

## 2016-04-01 MED ORDER — HYDROCHLOROTHIAZIDE 25 MG PO TABS: 25.0000 mg | ORAL_TABLET | Freq: Every day | ORAL | 1 refills | Status: DC

## 2016-04-01 MED ORDER — LOSARTAN POTASSIUM 100 MG PO TABS: 100.0000 mg | ORAL_TABLET | Freq: Every day | ORAL | 1 refills | Status: DC

## 2016-04-01 MED ORDER — CLOPIDOGREL BISULFATE 75 MG PO TABS: 75.0000 mg | ORAL_TABLET | Freq: Every day | ORAL | 1 refills | Status: DC

## 2016-04-01 NOTE — Assessment & Plan Note (Signed)
Stable. Patient will continue on current medication.

## 2016-04-01 NOTE — Assessment & Plan Note (Signed)
Patient is doing well and will continue on current medication.

## 2016-04-01 NOTE — Patient Instructions (Addendum)
Quit smoking class Free [phone]  [place] benefits [phone]

## 2016-04-01 NOTE — Progress Notes (Signed)
BP 128/77 (BP Location: Left Arm, Patient Position: Sitting, Cuff Size: Large)   Pulse 72   Temp 97.7 F (36.5 C)   Ht 4' 11.3" (1.506 m)   Wt 129 lb 9.6 oz (58.8 kg)   LMP  (LMP Unknown)   SpO2 99%   BMI 25.91 kg/m    Subjective:    Patient ID: Kristi Clark, female    DOB: Sep 26, 1950, 66 y.o.   MRN: [id number]  HPI: Kristi Clark is a 66 y.o. female  Chief Complaint  Patient presents with  . Hyperlipidemia  . Hypertension  . Gastroesophageal Reflux   Hypertension  Taking medications as prescribed without difficulty. She does not check her blood pressure at home. Denies any visual changes, edema, headaches, light headedness, chest pain or shortness of breath.   Hyperlipidemia  Taking medications as prescribed without difficulty. Denies any muscle aches. States that tries to eat a balanced diet. She does not exercise, but notes that she moves around a lot throughout the day.  Gastroesophageal Reflux Takes medications as prescribed without difficulty. States that she is doing well and has had no problems with her reflux while taking her Protonix. Denies any sore throat, cough, abdominal distension or nausea.  COPD Patient seeing pulmonary. States that she is doing well, but notes some fatigue and occasional wheezing. Denies any chest tightness or difficulty with shortness of breath. Currently still smoking. Discussed smoking cessation and patient reports that she is open to trying to quit again.  Relevant past medical, surgical, family and social history reviewed and updated as indicated. Interim medical history since our last visit reviewed. Allergies and medications reviewed and updated.  Review of Systems  Constitutional: Positive for fatigue. Negative for appetite change and unexpected weight change.  HENT: Negative.  Negative for sore throat.   Eyes: Negative for pain, redness, itching and visual disturbance.  Respiratory: Positive for wheezing. Negative for  cough, chest tightness and shortness of breath.   Cardiovascular: Negative for chest pain, palpitations and leg swelling.  Gastrointestinal: Negative for abdominal distention, abdominal pain, constipation, diarrhea and nausea.  Endocrine: Negative for polydipsia and polyuria.  Genitourinary: Negative.   Musculoskeletal: Negative for arthralgias, joint swelling and myalgias.  Skin: Negative for rash.  Neurological: Negative for dizziness, light-headedness and headaches.    Per HPI unless specifically indicated above     Objective:    BP 128/77 (BP Location: Left Arm, Patient Position: Sitting, Cuff Size: Large)   Pulse 72   Temp 97.7 F (36.5 C)   Ht 4' 11.3" (1.506 m)   Wt 129 lb 9.6 oz (58.8 kg)   LMP  (LMP Unknown)   SpO2 99%   BMI 25.91 kg/m   Wt Readings from Last 3 Encounters:  04/01/16 129 lb 9.6 oz (58.8 kg)  03/02/16 128 lb (58.1 kg)  02/18/16 128 lb 11.2 oz (58.4 kg)    Physical Exam  Constitutional: She is oriented to person, place, and time. She appears well-developed and well-nourished. No distress.  HENT:  Head: Normocephalic and atraumatic.  Eyes: Conjunctivae are normal. Right eye exhibits no discharge. Left eye exhibits no discharge. No scleral icterus.  Cardiovascular: Normal rate, regular rhythm and normal heart sounds.   Pulmonary/Chest: Effort normal. No respiratory distress. She has decreased breath sounds.  Neurological: She is alert and oriented to person, place, and time.  Skin: Skin is warm and dry. She is not diaphoretic.  Psychiatric: She has a normal mood and affect. Her behavior is normal.  Judgment and thought content normal.  Nursing note and vitals reviewed.   Results for orders placed or performed in visit on 09/15/15  Hepatitis C antibody  Result Value Ref Range   Hep C Virus Ab <0.1 0.0 - 0.9 s/co ratio  Comprehensive metabolic panel  Result Value Ref Range   Glucose 93 65 - 99 mg/dL   BUN 16 8 - 27 mg/dL   Creatinine, Ser 0.82 0.57  - 1.00 mg/dL   GFR calc non Af Amer 75 >59 mL/min/1.73   GFR calc Af Amer 86 >59 mL/min/1.73   BUN/Creatinine Ratio 20 12 - 28   Sodium 136 134 - 144 mmol/L   Potassium 3.9 3.5 - 5.2 mmol/L   Chloride 91 (L) 96 - 106 mmol/L   CO2 27 18 - 29 mmol/L   Calcium 9.3 8.7 - 10.3 mg/dL   Total Protein 6.8 6.0 - 8.5 g/dL   Albumin 4.0 3.6 - 4.8 g/dL   Globulin, Total 2.8 1.5 - 4.5 g/dL   Albumin/Globulin Ratio 1.4 1.2 - 2.2   Bilirubin Total 0.3 0.0 - 1.2 mg/dL   Alkaline Phosphatase 99 39 - 117 IU/L   AST 16 0 - 40 IU/L   ALT 11 0 - 32 IU/L  Lipid Panel w/o Chol/HDL Ratio  Result Value Ref Range   Cholesterol, Total 140 100 - 199 mg/dL   Triglycerides 209 (H) 0 - 149 mg/dL   HDL 52 >39 mg/dL   VLDL Cholesterol Cal 42 (H) 5 - 40 mg/dL   LDL Calculated 46 0 - 99 mg/dL      Assessment & Plan:   Problem List Items Addressed This Visit      Cardiovascular and Mediastinum   Benign hypertension with chronic kidney disease    Stable. Will continue on current medication.      Relevant Medications   atorvastatin (LIPITOR) 40 MG tablet   hydrochlorothiazide (HYDRODIURIL) 25 MG tablet   losartan (COZAAR) 100 MG tablet   metoprolol succinate (TOPROL-XL) 50 MG 24 hr tablet     Digestive   GERD (gastroesophageal reflux disease)    Patient is doing well and will continue on current medication.       Relevant Medications   pantoprazole (PROTONIX) 40 MG tablet     Other   Hyperlipidemia    Stable. Patient will continue on current medication.      Relevant Medications   atorvastatin (LIPITOR) 40 MG tablet   hydrochlorothiazide (HYDRODIURIL) 25 MG tablet   losartan (COZAAR) 100 MG tablet   metoprolol succinate (TOPROL-XL) 50 MG 24 hr tablet   Nicotine addiction    Discussed smoking cessation. Patient is open to trying to quit and reports that she will attempt using the patch again, as it has worked for her in the past. Also given information for free smoking cessation program.           Follow up plan: Return in about 6 months (around 09/29/2016) for physical.

## 2016-04-01 NOTE — Assessment & Plan Note (Signed)
Discussed smoking cessation. Patient is open to trying to quit and reports that she will attempt using the patch again, as it has worked for her in the past. Also given information for free smoking cessation program.

## 2016-04-01 NOTE — Assessment & Plan Note (Signed)
Stable. Will continue on current medication.

## 2016-05-02 DIAGNOSIS — E782 Mixed hyperlipidemia: Secondary | ICD-10-CM | POA: Diagnosis not present

## 2016-05-02 DIAGNOSIS — I2510 Atherosclerotic heart disease of native coronary artery without angina pectoris: Secondary | ICD-10-CM | POA: Diagnosis not present

## 2016-05-02 DIAGNOSIS — K219 Gastro-esophageal reflux disease without esophagitis: Secondary | ICD-10-CM | POA: Diagnosis not present

## 2016-05-02 DIAGNOSIS — I10 Essential (primary) hypertension: Secondary | ICD-10-CM | POA: Diagnosis not present

## 2016-05-11 DIAGNOSIS — I2510 Atherosclerotic heart disease of native coronary artery without angina pectoris: Secondary | ICD-10-CM | POA: Diagnosis not present

## 2016-05-17 DIAGNOSIS — I2510 Atherosclerotic heart disease of native coronary artery without angina pectoris: Secondary | ICD-10-CM | POA: Diagnosis not present

## 2016-05-17 DIAGNOSIS — F17210 Nicotine dependence, cigarettes, uncomplicated: Secondary | ICD-10-CM | POA: Diagnosis not present

## 2016-05-17 DIAGNOSIS — R0602 Shortness of breath: Secondary | ICD-10-CM | POA: Diagnosis not present

## 2016-05-17 DIAGNOSIS — R079 Chest pain, unspecified: Secondary | ICD-10-CM | POA: Diagnosis not present

## 2016-05-17 DIAGNOSIS — I10 Essential (primary) hypertension: Secondary | ICD-10-CM | POA: Diagnosis not present

## 2016-05-17 DIAGNOSIS — E782 Mixed hyperlipidemia: Secondary | ICD-10-CM | POA: Diagnosis not present

## 2016-05-31 DIAGNOSIS — Z720 Tobacco use: Secondary | ICD-10-CM | POA: Diagnosis not present

## 2016-05-31 DIAGNOSIS — J449 Chronic obstructive pulmonary disease, unspecified: Secondary | ICD-10-CM | POA: Diagnosis not present

## 2016-05-31 DIAGNOSIS — R0609 Other forms of dyspnea: Secondary | ICD-10-CM | POA: Diagnosis not present

## 2016-05-31 DIAGNOSIS — J849 Interstitial pulmonary disease, unspecified: Secondary | ICD-10-CM | POA: Diagnosis not present

## 2016-06-28 DIAGNOSIS — H25013 Cortical age-related cataract, bilateral: Secondary | ICD-10-CM | POA: Diagnosis not present

## 2016-07-22 DIAGNOSIS — I2510 Atherosclerotic heart disease of native coronary artery without angina pectoris: Secondary | ICD-10-CM | POA: Diagnosis not present

## 2016-07-22 DIAGNOSIS — E782 Mixed hyperlipidemia: Secondary | ICD-10-CM | POA: Diagnosis not present

## 2016-07-22 DIAGNOSIS — R0602 Shortness of breath: Secondary | ICD-10-CM | POA: Diagnosis not present

## 2016-07-22 DIAGNOSIS — I10 Essential (primary) hypertension: Secondary | ICD-10-CM | POA: Diagnosis not present

## 2016-08-30 ENCOUNTER — Telehealth: Payer: Self-pay | Admitting: Unknown Physician Specialty

## 2016-08-30 NOTE — Telephone Encounter (Signed)
Pt called and stated that she was having dental work done and wanted to know if it would be ok for her to stop taking the Plavix and aspirin for 3 days.

## 2016-08-30 NOTE — Telephone Encounter (Signed)
Routing to provider  

## 2016-08-31 NOTE — Telephone Encounter (Signed)
Yes it is!

## 2016-08-31 NOTE — Telephone Encounter (Signed)
Called and let patient know that Malachy Mood said it was OK for her to stop her medications for dental work.

## 2016-10-03 ENCOUNTER — Encounter: Payer: Self-pay | Admitting: Unknown Physician Specialty

## 2016-10-03 ENCOUNTER — Ambulatory Visit (INDEPENDENT_AMBULATORY_CARE_PROVIDER_SITE_OTHER): Payer: Medicare Other | Admitting: Unknown Physician Specialty

## 2016-10-03 VITALS — BP 132/64 | HR 71 | Temp 97.9°F | Ht 59.0 in | Wt 135.7 lb

## 2016-10-03 DIAGNOSIS — I2510 Atherosclerotic heart disease of native coronary artery without angina pectoris: Secondary | ICD-10-CM

## 2016-10-03 DIAGNOSIS — E785 Hyperlipidemia, unspecified: Secondary | ICD-10-CM | POA: Diagnosis not present

## 2016-10-03 DIAGNOSIS — Z7189 Other specified counseling: Secondary | ICD-10-CM | POA: Diagnosis not present

## 2016-10-03 DIAGNOSIS — I129 Hypertensive chronic kidney disease with stage 1 through stage 4 chronic kidney disease, or unspecified chronic kidney disease: Secondary | ICD-10-CM | POA: Diagnosis not present

## 2016-10-03 DIAGNOSIS — K219 Gastro-esophageal reflux disease without esophagitis: Secondary | ICD-10-CM

## 2016-10-03 DIAGNOSIS — D509 Iron deficiency anemia, unspecified: Secondary | ICD-10-CM | POA: Insufficient documentation

## 2016-10-03 DIAGNOSIS — Z862 Personal history of diseases of the blood and blood-forming organs and certain disorders involving the immune mechanism: Secondary | ICD-10-CM | POA: Diagnosis not present

## 2016-10-03 DIAGNOSIS — Z1239 Encounter for other screening for malignant neoplasm of breast: Secondary | ICD-10-CM

## 2016-10-03 DIAGNOSIS — F17219 Nicotine dependence, cigarettes, with unspecified nicotine-induced disorders: Secondary | ICD-10-CM

## 2016-10-03 DIAGNOSIS — Z0000 Encounter for general adult medical examination without abnormal findings: Secondary | ICD-10-CM

## 2016-10-03 MED ORDER — CLOPIDOGREL BISULFATE 75 MG PO TABS: 75.0000 mg | ORAL_TABLET | Freq: Every day | ORAL | 1 refills | Status: DC

## 2016-10-03 MED ORDER — PANTOPRAZOLE SODIUM 20 MG PO TBEC: 20.0000 mg | DELAYED_RELEASE_TABLET | Freq: Every day | ORAL | 3 refills | Status: DC

## 2016-10-03 MED ORDER — METOPROLOL SUCCINATE ER 50 MG PO TB24: 50.0000 mg | ORAL_TABLET | Freq: Every day | ORAL | 1 refills | Status: DC

## 2016-10-03 MED ORDER — ATORVASTATIN CALCIUM 40 MG PO TABS: 40.0000 mg | ORAL_TABLET | Freq: Every day | ORAL | 1 refills | Status: DC

## 2016-10-03 MED ORDER — LOSARTAN POTASSIUM 100 MG PO TABS
100.0000 mg | ORAL_TABLET | Freq: Every day | ORAL | 1 refills | Status: DC
Start: 2016-10-03 — End: 2017-04-04

## 2016-10-03 MED ORDER — ALBUTEROL SULFATE HFA 108 (90 BASE) MCG/ACT IN AERS: 2.0000 | INHALATION_SPRAY | Freq: Four times a day (QID) | RESPIRATORY_TRACT | 3 refills | Status: DC | PRN

## 2016-10-03 MED ORDER — HYDROCHLOROTHIAZIDE 25 MG PO TABS: 25.0000 mg | ORAL_TABLET | Freq: Every day | ORAL | 1 refills | Status: DC

## 2016-10-03 MED ORDER — FLUTICASONE-SALMETEROL 250-50 MCG/DOSE IN AEPB
1.0000 | INHALATION_SPRAY | Freq: Two times a day (BID) | RESPIRATORY_TRACT | 1 refills | Status: DC
Start: 2016-10-03 — End: 2017-04-04

## 2016-10-03 MED ORDER — TIOTROPIUM BROMIDE MONOHYDRATE 18 MCG IN CAPS: 18.0000 ug | ORAL_CAPSULE | Freq: Every day | RESPIRATORY_TRACT | 1 refills | Status: DC

## 2016-10-03 MED ORDER — FLUTICASONE PROPIONATE 50 MCG/ACT NA SUSP: 2.0000 | Freq: Every day | NASAL | 3 refills | Status: DC

## 2016-10-03 NOTE — Assessment & Plan Note (Signed)
Stable, continue present medications.   

## 2016-10-03 NOTE — Assessment & Plan Note (Signed)
I have recommended absolute tobacco cessation. I have discussed various options available for assistance with tobacco cessation including over the counter methods (Nicotine gum, patch and lozenges). We also discussed prescription options (Chantix, Nicotine Inhaler / Nasal Spray). The patient is not interested in pursuing any prescription tobacco cessation options at this time.  

## 2016-10-03 NOTE — Assessment & Plan Note (Signed)
Check lipid panel  

## 2016-10-03 NOTE — Assessment & Plan Note (Signed)
Per Dr. Khan 

## 2016-10-03 NOTE — Progress Notes (Signed)
BP 132/64   Pulse 71   Temp 97.9 F (36.6 C)   Ht 4\' 11"  (1.499 m)   Wt 135 lb 11.2 oz (61.6 kg)   LMP  (LMP Unknown)   SpO2 97%   BMI 27.41 kg/m    Subjective:    Patient ID: Kristi Clark, female    DOB: 12-06-1950, 66 y.o.   MRN: [id number]  HPI: Kristi Clark is a 66 y.o. female  Chief Complaint  Patient presents with  . Medicare Wellness   Hypertension Using medications without difficulty Average home BPs   No problems or lightheadedness No chest pain with exertion or shortness of breath No Edema   Hyperlipidemia Using medications without problems: No Muscle aches  Diet compliance: Exercise:  COPD Some trouble with high humidity.  Uses rescue inhaler on occasion.    Functional Status Survey: Is the patient deaf or have difficulty hearing?: No Does the patient have difficulty seeing, even when wearing glasses/contacts?: No Does the patient have difficulty concentrating, remembering, or making decisions?: No Does the patient have difficulty walking or climbing stairs?: No Does the patient have difficulty dressing or bathing?: No Does the patient have difficulty doing errands alone such as visiting a doctor's office or shopping?: No  Fall Risk  10/03/2016 09/15/2015 03/06/2015  Falls in the past year? No No No   Depression screen [facility] 2/9 10/03/2016 09/15/2015 03/06/2015  Decreased Interest 0 0 0  Down, Depressed, Hopeless 0 0 0  PHQ - 2 Score 0 0 0  Altered sleeping 0 - -  Tired, decreased energy 1 - -  Change in appetite 1 - -  Feeling bad or failure about yourself  0 - -  Trouble concentrating 0 - -  Moving slowly or fidgety/restless 0 - -  Suicidal thoughts 0 - -  PHQ-9 Score 2 - -   Social History   Social History  . Marital status: Married    Spouse name: N/A  . Number of children: N/A  . Years of education: N/A   Occupational History  . Not on file.   Social History Main Topics  . Smoking status: Current Every Day Smoker   Packs/day: 1.00    Types: Cigarettes  . Smokeless tobacco: Never Used  . Alcohol use No  . Drug use: No  . Sexual activity: No   Other Topics Concern  . Not on file   Social History Narrative  . No narrative on file   Family History  Problem Relation Age of Onset  . Cancer Mother   . Stroke Mother   . Heart disease Father   . Hyperlipidemia Father   . Cancer Sister   . Diabetes Sister   . Diabetes Brother   . Asthma Son   . Cancer Son   . Diabetes Daughter   . Hypertension Daughter   . Cancer Maternal Grandmother        gallbladder  . Diabetes Brother   . Heart disease Brother    Past Medical History:  Diagnosis Date  . Depression   . Hyperlipidemia   . Menopause   . Osteopenia   . Tobacco abuse    Past Surgical History:  Procedure Laterality Date  . ABDOMINAL HYSTERECTOMY    . CHOLECYSTECTOMY  2014     Relevant past medical, surgical, family and social history reviewed and updated as indicated. Interim medical history since our last visit reviewed. Allergies and medications reviewed and updated.  Review of Systems  Per HPI unless specifically indicated above     Objective:    BP 132/64   Pulse 71   Temp 97.9 F (36.6 C)   Ht 4\' 11"  (1.499 m)   Wt 135 lb 11.2 oz (61.6 kg)   LMP  (LMP Unknown)   SpO2 97%   BMI 27.41 kg/m   Wt Readings from Last 3 Encounters:  10/03/16 135 lb 11.2 oz (61.6 kg)  04/01/16 129 lb 9.6 oz (58.8 kg)  03/02/16 128 lb (58.1 kg)    Physical Exam  Constitutional: She is oriented to person, place, and time. She appears well-developed and well-nourished.  HENT:  Head: Normocephalic and atraumatic.  Eyes: Pupils are equal, round, and reactive to light. Right eye exhibits no discharge. Left eye exhibits no discharge. No scleral icterus.  Neck: Normal range of motion. Neck supple. Carotid bruit is not present. No thyromegaly present.  Cardiovascular: Normal rate, regular rhythm and normal heart sounds.  Exam reveals no  gallop and no friction rub.   No murmur heard. Pulmonary/Chest: Effort normal and breath sounds normal. No respiratory distress. She has no wheezes. She has no rales.  Abdominal: Soft. Bowel sounds are normal. There is no tenderness. There is no rebound.  Genitourinary: No breast swelling, tenderness or discharge.  Musculoskeletal: Normal range of motion.  Lymphadenopathy:    She has no cervical adenopathy.  Neurological: She is alert and oriented to person, place, and time.  Skin: Skin is warm, dry and intact. No rash noted.  Psychiatric: She has a normal mood and affect. Her speech is normal and behavior is normal. Judgment and thought content normal. Cognition and memory are normal.    Results for orders placed or performed in visit on 09/15/15  Hepatitis C antibody  Result Value Ref Range   Hep C Virus Ab <0.1 0.0 - 0.9 s/co ratio  Comprehensive metabolic panel  Result Value Ref Range   Glucose 93 65 - 99 mg/dL   BUN 16 8 - 27 mg/dL   Creatinine, Ser 0.82 0.57 - 1.00 mg/dL   GFR calc non Af Amer 75 >59 mL/min/1.73   GFR calc Af Amer 86 >59 mL/min/1.73   BUN/Creatinine Ratio 20 12 - 28   Sodium 136 134 - 144 mmol/L   Potassium 3.9 3.5 - 5.2 mmol/L   Chloride 91 (L) 96 - 106 mmol/L   CO2 27 18 - 29 mmol/L   Calcium 9.3 8.7 - 10.3 mg/dL   Total Protein 6.8 6.0 - 8.5 g/dL   Albumin 4.0 3.6 - 4.8 g/dL   Globulin, Total 2.8 1.5 - 4.5 g/dL   Albumin/Globulin Ratio 1.4 1.2 - 2.2   Bilirubin Total 0.3 0.0 - 1.2 mg/dL   Alkaline Phosphatase 99 39 - 117 IU/L   AST 16 0 - 40 IU/L   ALT 11 0 - 32 IU/L  Lipid Panel w/o Chol/HDL Ratio  Result Value Ref Range   Cholesterol, Total 140 100 - 199 mg/dL   Triglycerides 209 (H) 0 - 149 mg/dL   HDL 52 >39 mg/dL   VLDL Cholesterol Cal 42 (H) 5 - 40 mg/dL   LDL Calculated 46 0 - 99 mg/dL      Assessment & Plan:   Problem List Items Addressed This Visit      Unprioritized   Advanced care planning/counseling discussion    A voluntary  discussion about advance care planning including the explanation and discussion of advance directives was extensively discussed  with the patient.  Explanation about the health care proxy and Living will was reviewed and packet with forms with explanation of how to fill them out was given.  During this discussion, the patient was able to identify a health care proxy as one of her children and plans to fill out the paperwork required.  Patient was offered a separate [place] visit for further assistance with forms.         Benign hypertension with chronic kidney disease    Stable, continue present medications.        Relevant Medications   isosorbide dinitrate (ISORDIL) 30 MG tablet   metoprolol succinate (TOPROL-XL) 50 MG 24 hr tablet   atorvastatin (LIPITOR) 40 MG tablet   hydrochlorothiazide (HYDRODIURIL) 25 MG tablet   losartan (COZAAR) 100 MG tablet   Other Relevant Orders   Comprehensive metabolic panel   CAD (coronary artery disease)    Per Dr. Humphrey Rolls      Relevant Medications   isosorbide dinitrate (ISORDIL) 30 MG tablet   metoprolol succinate (TOPROL-XL) 50 MG 24 hr tablet   atorvastatin (LIPITOR) 40 MG tablet   hydrochlorothiazide (HYDRODIURIL) 25 MG tablet   losartan (COZAAR) 100 MG tablet   Other Relevant Orders   Lipid Panel w/o Chol/HDL Ratio   GERD (gastroesophageal reflux disease)    Stable, continue present medications.        Relevant Medications   pantoprazole (PROTONIX) 20 MG tablet   History of anemia   Relevant Orders   CBC with Differential/Platelet   Hyperlipidemia    Check lipid panel      Relevant Medications   isosorbide dinitrate (ISORDIL) 30 MG tablet   metoprolol succinate (TOPROL-XL) 50 MG 24 hr tablet   atorvastatin (LIPITOR) 40 MG tablet   hydrochlorothiazide (HYDRODIURIL) 25 MG tablet   losartan (COZAAR) 100 MG tablet   Nicotine addiction     I have recommended absolute tobacco cessation. I have discussed various options  available for assistance with tobacco cessation including over the counter methods (Nicotine gum, patch and lozenges). We also discussed prescription options (Chantix, Nicotine Inhaler / Nasal Spray). The patient is not interested in pursuing any prescription tobacco cessation options at this time.        Other Visit Diagnoses    Annual physical exam    -  Primary   Breast cancer screening       Relevant Orders   MM DIGITAL SCREENING BILATERAL       Follow up plan: Return in about 6 months (around 04/05/2017).

## 2016-10-03 NOTE — Assessment & Plan Note (Signed)
A voluntary discussion about advance care planning including the explanation and discussion of advance directives was extensively discussed  with the patient.  Explanation about the health care proxy and Living will was reviewed and packet with forms with explanation of how to fill them out was given.  During this discussion, the patient was able to identify a health care proxy as one of her children and plans to fill out the paperwork required.  Patient was offered a separate [place] visit for further assistance with forms.

## 2016-10-04 ENCOUNTER — Encounter: Payer: Self-pay | Admitting: Unknown Physician Specialty

## 2016-10-04 DIAGNOSIS — R0602 Shortness of breath: Secondary | ICD-10-CM | POA: Diagnosis not present

## 2016-10-04 DIAGNOSIS — R05 Cough: Secondary | ICD-10-CM | POA: Diagnosis not present

## 2016-10-04 LAB — CBC WITH DIFFERENTIAL/PLATELET
BASOS: 1 %
Basophils Absolute: 0 10*3/uL (ref 0.0–0.2)
EOS (ABSOLUTE): 0.2 10*3/uL (ref 0.0–0.4)
EOS: 4 %
Hematocrit: 35.1 % (ref 34.0–46.6)
Hemoglobin: 12.3 g/dL (ref 11.1–15.9)
IMMATURE GRANS (ABS): 0 10*3/uL (ref 0.0–0.1)
IMMATURE GRANULOCYTES: 0 %
LYMPHS: 40 %
Lymphocytes Absolute: 2.4 10*3/uL (ref 0.7–3.1)
MCH: 30.8 pg (ref 26.6–33.0)
MCHC: 35 g/dL (ref 31.5–35.7)
MCV: 88 fL (ref 79–97)
MONOCYTES: 7 %
Monocytes Absolute: 0.4 10*3/uL (ref 0.1–0.9)
NEUTROS PCT: 48 %
Neutrophils Absolute: 2.9 10*3/uL (ref 1.4–7.0)
PLATELETS: 234 10*3/uL (ref 150–379)
RBC: 4 x10E6/uL (ref 3.77–5.28)
RDW: 14.8 % (ref 12.3–15.4)
WBC: 6 10*3/uL (ref 3.4–10.8)

## 2016-10-04 LAB — COMPREHENSIVE METABOLIC PANEL
A/G RATIO: 2 (ref 1.2–2.2)
ALT: 17 IU/L (ref 0–32)
AST: 24 IU/L (ref 0–40)
Albumin: 4.5 g/dL (ref 3.6–4.8)
Alkaline Phosphatase: 92 IU/L (ref 39–117)
BUN/Creatinine Ratio: 13 (ref 12–28)
BUN: 12 mg/dL (ref 8–27)
Bilirubin Total: 0.3 mg/dL (ref 0.0–1.2)
CALCIUM: 9.7 mg/dL (ref 8.7–10.3)
CO2: 27 mmol/L (ref 20–29)
Chloride: 88 mmol/L — ABNORMAL LOW (ref 96–106)
Creatinine, Ser: 0.89 mg/dL (ref 0.57–1.00)
GFR, EST AFRICAN AMERICAN: 78 mL/min/{1.73_m2} (ref >59)
GFR, EST NON AFRICAN AMERICAN: 67 mL/min/{1.73_m2} (ref >59)
Globulin, Total: 2.2 g/dL (ref 1.5–4.5)
Glucose: 100 mg/dL — ABNORMAL HIGH (ref 65–99)
POTASSIUM: 4.1 mmol/L (ref 3.5–5.2)
Sodium: 130 mmol/L — ABNORMAL LOW (ref 134–144)
TOTAL PROTEIN: 6.7 g/dL (ref 6.0–8.5)

## 2016-10-04 LAB — LIPID PANEL W/O CHOL/HDL RATIO
Cholesterol, Total: 132 mg/dL (ref 100–199)
HDL: 58 mg/dL (ref >39)
LDL CALC: 55 mg/dL (ref 0–99)
Triglycerides: 95 mg/dL (ref 0–149)
VLDL Cholesterol Cal: 19 mg/dL (ref 5–40)

## 2016-10-21 ENCOUNTER — Ambulatory Visit
Admission: RE | Admit: 2016-10-21 | Discharge: 2016-10-21 | Disposition: A | Discharge status: Home / Self Care | Payer: Medicare Other | Source: Ambulatory Visit | Attending: Unknown Physician Specialty | Admitting: Unknown Physician Specialty

## 2016-10-21 DIAGNOSIS — Z1231 Encounter for screening mammogram for malignant neoplasm of breast: Secondary | ICD-10-CM | POA: Diagnosis not present

## 2016-10-21 DIAGNOSIS — Z1239 Encounter for other screening for malignant neoplasm of breast: Secondary | ICD-10-CM

## 2016-10-25 DIAGNOSIS — I10 Essential (primary) hypertension: Secondary | ICD-10-CM | POA: Diagnosis not present

## 2016-10-25 DIAGNOSIS — I2510 Atherosclerotic heart disease of native coronary artery without angina pectoris: Secondary | ICD-10-CM | POA: Diagnosis not present

## 2016-10-25 DIAGNOSIS — R0602 Shortness of breath: Secondary | ICD-10-CM | POA: Diagnosis not present

## 2016-10-25 DIAGNOSIS — E782 Mixed hyperlipidemia: Secondary | ICD-10-CM | POA: Diagnosis not present

## 2016-11-25 ENCOUNTER — Encounter: Payer: Self-pay | Admitting: Family Medicine

## 2016-11-25 ENCOUNTER — Ambulatory Visit (INDEPENDENT_AMBULATORY_CARE_PROVIDER_SITE_OTHER): Payer: Medicare Other | Admitting: Family Medicine

## 2016-11-25 VITALS — BP 132/62 | HR 78 | Temp 98.4°F | Wt 133.0 lb

## 2016-11-25 DIAGNOSIS — I2510 Atherosclerotic heart disease of native coronary artery without angina pectoris: Secondary | ICD-10-CM

## 2016-11-25 DIAGNOSIS — R52 Pain, unspecified: Secondary | ICD-10-CM | POA: Diagnosis not present

## 2016-11-25 NOTE — Progress Notes (Signed)
BP 132/62   Pulse 78   Temp 98.4 F (36.9 C)   Wt 133 lb (60.3 kg)   LMP  (LMP Unknown)   SpO2 97%   BMI 26.86 kg/m    Subjective:    Patient ID: Kristi Clark, female    DOB: Nov 03, 1950, 66 y.o.   MRN: [id number]  HPI: Kristi Clark is a 66 y.o. female  Chief Complaint  Patient presents with  . Generalized Body Aches    x 6 days increased body aches and right arm pain. Just feels run down.   Right arm pain and generalized body aches x 6 days. States arm pain feels like muscle aches down through entire arm. Denies fever, chills, sweats, congestion, HAs, rashes. No sick contacts. Taking tylenol prn with good temporary relief. No known tick bites. Does state that she moves her husbands heavy chair often with this arm.   Relevant past medical, surgical, family and social history reviewed and updated as indicated. Interim medical history since our last visit reviewed. Allergies and medications reviewed and updated.  Review of Systems  Constitutional: Negative.   HENT: Negative.   Respiratory: Negative.   Cardiovascular: Negative.   Gastrointestinal: Negative.   Genitourinary: Negative.   Musculoskeletal: Positive for myalgias.  Neurological: Negative.   Psychiatric/Behavioral: Negative.     Per HPI unless specifically indicated above     Objective:    BP 132/62   Pulse 78   Temp 98.4 F (36.9 C)   Wt 133 lb (60.3 kg)   LMP  (LMP Unknown)   SpO2 97%   BMI 26.86 kg/m   Wt Readings from Last 3 Encounters:  11/25/16 133 lb (60.3 kg)  10/03/16 135 lb 11.2 oz (61.6 kg)  04/01/16 129 lb 9.6 oz (58.8 kg)    Physical Exam  Constitutional: She is oriented to person, place, and time. She appears well-developed and well-nourished.  HENT:  Head: Atraumatic.  Eyes: Pupils are equal, round, and reactive to light. Conjunctivae are normal.  Neck: Neck supple.  Cardiovascular: Normal rate and normal heart sounds.   Pulmonary/Chest: Effort normal and breath  sounds normal. No respiratory distress.  Musculoskeletal: Normal range of motion. She exhibits tenderness (mild diffuse ttp right arm). She exhibits no edema.  Neurological: She is alert and oriented to person, place, and time. No cranial nerve deficit.  Skin: Skin is warm and dry.  Psychiatric: She has a normal mood and affect. Her behavior is normal.  Nursing note and vitals reviewed.   Results for orders placed or performed in visit on 10/03/16  CBC with Differential/Platelet  Result Value Ref Range   WBC 6.0 3.4 - 10.8 x10E3/uL   RBC 4.00 3.77 - 5.28 x10E6/uL   Hemoglobin 12.3 11.1 - 15.9 g/dL   Hematocrit 35.1 34.0 - 46.6 %   MCV 88 79 - 97 fL   MCH 30.8 26.6 - 33.0 pg   MCHC 35.0 31.5 - 35.7 g/dL   RDW 14.8 12.3 - 15.4 %   Platelets 234 150 - 379 x10E3/uL   Neutrophils 48 Not Estab. %   Lymphs 40 Not Estab. %   Monocytes 7 Not Estab. %   Eos 4 Not Estab. %   Basos 1 Not Estab. %   Neutrophils Absolute 2.9 1.4 - 7.0 x10E3/uL   Lymphocytes Absolute 2.4 0.7 - 3.1 x10E3/uL   Monocytes Absolute 0.4 0.1 - 0.9 x10E3/uL   EOS (ABSOLUTE) 0.2 0.0 - 0.4 x10E3/uL   Basophils Absolute 0.0  0.0 - 0.2 x10E3/uL   Immature Granulocytes 0 Not Estab. %   Immature Grans (Abs) 0.0 0.0 - 0.1 x10E3/uL  Comprehensive metabolic panel  Result Value Ref Range   Glucose 100 (H) 65 - 99 mg/dL   BUN 12 8 - 27 mg/dL   Creatinine, Ser 0.89 0.57 - 1.00 mg/dL   GFR calc non Af Amer 67 >59 mL/min/1.73   GFR calc Af Amer 78 >59 mL/min/1.73   BUN/Creatinine Ratio 13 12 - 28   Sodium 130 (L) 134 - 144 mmol/L   Potassium 4.1 3.5 - 5.2 mmol/L   Chloride 88 (L) 96 - 106 mmol/L   CO2 27 20 - 29 mmol/L   Calcium 9.7 8.7 - 10.3 mg/dL   Total Protein 6.7 6.0 - 8.5 g/dL   Albumin 4.5 3.6 - 4.8 g/dL   Globulin, Total 2.2 1.5 - 4.5 g/dL   Albumin/Globulin Ratio 2.0 1.2 - 2.2   Bilirubin Total 0.3 0.0 - 1.2 mg/dL   Alkaline Phosphatase 92 39 - 117 IU/L   AST 24 0 - 40 IU/L   ALT 17 0 - 32 IU/L  Lipid Panel  w/o Chol/HDL Ratio  Result Value Ref Range   Cholesterol, Total 132 100 - 199 mg/dL   Triglycerides 95 0 - 149 mg/dL   HDL 58 >39 mg/dL   VLDL Cholesterol Cal 19 5 - 40 mg/dL   LDL Calculated 55 0 - 99 mg/dL      Assessment & Plan:   Problem List Items Addressed This Visit    None    Visit Diagnoses    Body aches    -  Primary   Declines basic labs. Vitals stable, WNL. Continue tylenol prn, topical analgesics, heat/cold, rest. F/u early next week if no improvement or worsening sxs.        Follow up plan: Return for as scheduled.

## 2016-11-27 NOTE — Patient Instructions (Signed)
Follow up as scheduled.  

## 2016-12-23 DIAGNOSIS — Z23 Encounter for immunization: Secondary | ICD-10-CM | POA: Diagnosis not present

## 2017-02-07 DIAGNOSIS — J849 Interstitial pulmonary disease, unspecified: Secondary | ICD-10-CM | POA: Diagnosis not present

## 2017-02-07 DIAGNOSIS — J449 Chronic obstructive pulmonary disease, unspecified: Secondary | ICD-10-CM | POA: Diagnosis not present

## 2017-02-24 DIAGNOSIS — R0602 Shortness of breath: Secondary | ICD-10-CM | POA: Diagnosis not present

## 2017-02-24 DIAGNOSIS — E782 Mixed hyperlipidemia: Secondary | ICD-10-CM | POA: Diagnosis not present

## 2017-02-24 DIAGNOSIS — I2510 Atherosclerotic heart disease of native coronary artery without angina pectoris: Secondary | ICD-10-CM | POA: Diagnosis not present

## 2017-02-24 DIAGNOSIS — I10 Essential (primary) hypertension: Secondary | ICD-10-CM | POA: Diagnosis not present

## 2017-03-29 ENCOUNTER — Ambulatory Visit (INDEPENDENT_AMBULATORY_CARE_PROVIDER_SITE_OTHER): Payer: Medicare Other | Admitting: Family Medicine

## 2017-03-29 ENCOUNTER — Encounter: Payer: Self-pay | Admitting: Family Medicine

## 2017-03-29 VITALS — BP 135/70 | HR 78 | Temp 97.7°F | Wt 133.4 lb

## 2017-03-29 DIAGNOSIS — R1084 Generalized abdominal pain: Secondary | ICD-10-CM | POA: Diagnosis not present

## 2017-03-29 DIAGNOSIS — H6593 Unspecified nonsuppurative otitis media, bilateral: Secondary | ICD-10-CM | POA: Diagnosis not present

## 2017-03-29 DIAGNOSIS — H6121 Impacted cerumen, right ear: Secondary | ICD-10-CM | POA: Diagnosis not present

## 2017-03-29 DIAGNOSIS — R197 Diarrhea, unspecified: Secondary | ICD-10-CM

## 2017-03-29 LAB — CBC WITH DIFFERENTIAL/PLATELET
HEMOGLOBIN: 13.4 g/dL (ref 11.1–15.9)
Hematocrit: 37.3 % (ref 34.0–46.6)
LYMPHS ABS: 3.3 10*3/uL — AB (ref 0.7–3.1)
LYMPHS: 37 %
MCH: 33.7 pg — AB (ref 26.6–33.0)
MCHC: 35.9 g/dL — AB (ref 31.5–35.7)
MCV: 94 fL (ref 79–97)
MID (Absolute): 0.5 10*3/uL (ref 0.1–1.6)
MID: 6 %
NEUTROS ABS: 5 10*3/uL (ref 1.4–7.0)
NEUTROS PCT: 57 %
PLATELETS: 228 10*3/uL (ref 150–379)
RBC: 3.98 x10E6/uL (ref 3.77–5.28)
RDW: 13.7 % (ref 12.3–15.4)
WBC: 8.8 10*3/uL (ref 3.4–10.8)

## 2017-03-29 LAB — UA/M W/RFLX CULTURE, ROUTINE
BILIRUBIN UA: NEGATIVE
GLUCOSE, UA: NEGATIVE
LEUKOCYTES UA: NEGATIVE
Nitrite, UA: NEGATIVE
PH UA: 6.5 (ref 5.0–7.5)
PROTEIN UA: NEGATIVE
RBC UA: NEGATIVE
SPEC GRAV UA: 1.015 (ref 1.005–1.030)
Urobilinogen, Ur: 0.2 mg/dL (ref 0.2–1.0)

## 2017-03-29 MED ORDER — SUCRALFATE 1 G PO TABS: 1.0000 g | ORAL_TABLET | Freq: Three times a day (TID) | ORAL | 0 refills | Status: DC

## 2017-03-29 NOTE — Patient Instructions (Addendum)
Probiotic supplement, beano or other gas relief.   Use debrox drops over the counter several times weekly and let the warm shower water rinse out the wax

## 2017-03-29 NOTE — Progress Notes (Signed)
BP 135/70   Pulse 78   Temp 97.7 F (36.5 C) (Oral)   Wt 133 lb 6.4 oz (60.5 kg)   LMP  (LMP Unknown)   SpO2 96%   BMI 26.94 kg/m    Subjective:    Patient ID: Kristi Clark, female    DOB: 1950-11-18, 67 y.o.   MRN: [id number]  HPI: Kristi Clark is a 67 y.o. female  Chief Complaint  Patient presents with  . GI Problem    pt states she has had stomach aches and cramping for the last week. States she has had a little bit of diarrhea as well  . Cerumen Impaction    pt states she would like her ears cleaned out    About a week of diarrhea and abdominal cramping. Diarrhea has settled the past few days. Denies fever, chills, body aches. Not trying anything other than some imodium the first day. States she's always had stomach issues similar to this and nobody has ever figured out the cause. 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 No recent travel, new foods, new medications.  Hx of gallbladder removal.   Also feels like both of her ears may be impacted, having pressure and difficulty hearing from them. Gets impactions from time to time. Does not try OTC wax drops.   Past Medical History:  Diagnosis Date  . Depression   . Hyperlipidemia   . Menopause   . Osteopenia   . Tobacco abuse    Social History   Socioeconomic History  . Marital status: Married    Spouse name: Not on file  . Number of children: Not on file  . Years of education: Not on file  . Highest education level: Not on file  Social Needs  . Financial resource strain: Not on file  . Food insecurity - worry: Not on file  . Food insecurity - inability: Not on file  . Transportation needs - medical: Not on file  . Transportation needs - non-medical: Not on file  Occupational History  . Not on file  Tobacco Use  . Smoking status: Current Every Day Smoker    Packs/day: 1.00    Types: Cigarettes  . Smokeless tobacco: Never Used  Substance and Sexual Activity  . Alcohol use: No  .  Drug use: No  . Sexual activity: No  Other Topics Concern  . Not on file  Social History Narrative  . Not on file    Relevant past medical, surgical, family and social history reviewed and updated as indicated. Interim medical history since our last visit reviewed. Allergies and medications reviewed and updated.  Review of Systems  Constitutional: Negative.   HENT: Negative.   Respiratory: Negative.   Cardiovascular: Negative.   Gastrointestinal: Positive for abdominal pain and diarrhea.  Genitourinary: Negative.   Musculoskeletal: Negative.   Skin: Negative.   Neurological: Negative.   Psychiatric/Behavioral: Negative.    Per HPI unless specifically indicated above     Objective:    BP 135/70   Pulse 78   Temp 97.7 F (36.5 C) (Oral)   Wt 133 lb 6.4 oz (60.5 kg)   LMP  (LMP Unknown)   SpO2 96%   BMI 26.94 kg/m   Wt Readings from Last 3 Encounters:  03/29/17 133 lb 6.4 oz (60.5 kg)  11/25/16 133 lb (60.3 kg)  10/03/16 135 lb 11.2 oz (61.6 kg)    Physical Exam  Constitutional: She is oriented to person,  place, and time. She appears well-developed and well-nourished. No distress.  HENT:  Head: Atraumatic.  Nose: Nose normal.  Mouth/Throat: Oropharynx is clear and moist. No oropharyngeal exudate.  Mild cerumen build up of right EAC, left EAC clear Minimal effusion b/l middle ears  Eyes: Conjunctivae are normal. Pupils are equal, round, and reactive to light.  Neck: Normal range of motion. Neck supple.  Pulmonary/Chest: Effort normal and breath sounds normal. No respiratory distress.  Abdominal: Soft. Bowel sounds are normal. She exhibits no distension. There is no tenderness. There is no guarding.  Musculoskeletal: Normal range of motion.  Neurological: She is alert and oriented to person, place, and time.  Skin: Skin is warm and dry. No rash noted.  Psychiatric: She has a normal mood and affect. Her behavior is normal.  Vitals reviewed.  Procedure: Lavage of  right EAC Warm water and lavage device used to gently clear debris from right EAC until TM well visualized. Procedure well tolerated with no complications.   Results for orders placed or performed in visit on 03/29/17  CBC With Differential/Platelet  Result Value Ref Range   WBC 8.8 3.4 - 10.8 x10E3/uL   RBC 3.98 3.77 - 5.28 x10E6/uL   Hemoglobin 13.4 11.1 - 15.9 g/dL   Hematocrit 37.3 34.0 - 46.6 %   MCV 94 79 - 97 fL   MCH 33.7 (H) 26.6 - 33.0 pg   MCHC 35.9 (H) 31.5 - 35.7 g/dL   RDW 13.7 12.3 - 15.4 %   Platelets 228 150 - 379 x10E3/uL   Neutrophils 57 Not Estab. %   Lymphs 37 Not Estab. %   MID 6 Not Estab. %   Neutrophils Absolute 5.0 1.4 - 7.0 x10E3/uL   Lymphocytes Absolute 3.3 (H) 0.7 - 3.1 x10E3/uL   MID (Absolute) 0.5 0.1 - 1.6 X10E3/uL  UA/M w/rflx Culture, Routine  Result Value Ref Range   Specific Gravity, UA 1.015 1.005 - 1.030   pH, UA 6.5 5.0 - 7.5   Color, UA Yellow Yellow   Appearance Ur Clear Clear   Leukocytes, UA Negative Negative   Protein, UA Negative Negative/Trace   Glucose, UA Negative Negative   Ketones, UA Trace (A) Negative   RBC, UA Negative Negative   Bilirubin, UA Negative Negative   Urobilinogen, Ur 0.2 0.2 - 1.0 mg/dL   Nitrite, UA Negative Negative      Assessment & Plan:   Problem List Items Addressed This Visit    None    Visit Diagnoses    Generalized abdominal pain    -  Primary   Hx sounds consistent with IBS w/ anxiety component. Start probiotic, carafate, bland foods. CBC WNL, low suspicion for diverticulitis or other infectious cause   Relevant Orders   CBC With Differential/Platelet (Completed)   UA/M w/rflx Culture, Routine (Completed)   Diarrhea, unspecified type       Continue imodium prn, stay well hydrated, bland foods. CBC and U/A WNL   Cerumen debris on tympanic membrane, right       Lavage performed on right ear with good relief of debris   Middle ear effusion, bilateral       Zyrtec and flonase daily to help  with ear pressure.        Follow up plan: Return for as scheduled.

## 2017-04-04 ENCOUNTER — Telehealth: Payer: Self-pay | Admitting: Unknown Physician Specialty

## 2017-04-04 ENCOUNTER — Ambulatory Visit (INDEPENDENT_AMBULATORY_CARE_PROVIDER_SITE_OTHER): Payer: Medicare Other | Admitting: Unknown Physician Specialty

## 2017-04-04 ENCOUNTER — Encounter: Payer: Self-pay | Admitting: Unknown Physician Specialty

## 2017-04-04 DIAGNOSIS — E785 Hyperlipidemia, unspecified: Secondary | ICD-10-CM | POA: Diagnosis not present

## 2017-04-04 DIAGNOSIS — J449 Chronic obstructive pulmonary disease, unspecified: Secondary | ICD-10-CM | POA: Diagnosis not present

## 2017-04-04 DIAGNOSIS — K580 Irritable bowel syndrome with diarrhea: Secondary | ICD-10-CM

## 2017-04-04 DIAGNOSIS — I129 Hypertensive chronic kidney disease with stage 1 through stage 4 chronic kidney disease, or unspecified chronic kidney disease: Secondary | ICD-10-CM

## 2017-04-04 DIAGNOSIS — K589 Irritable bowel syndrome without diarrhea: Secondary | ICD-10-CM | POA: Insufficient documentation

## 2017-04-04 MED ORDER — PANTOPRAZOLE SODIUM 20 MG PO TBEC: 20.0000 mg | DELAYED_RELEASE_TABLET | Freq: Every day | ORAL | 3 refills | Status: DC

## 2017-04-04 MED ORDER — TIOTROPIUM BROMIDE MONOHYDRATE 18 MCG IN CAPS: 18.0000 ug | ORAL_CAPSULE | Freq: Every day | RESPIRATORY_TRACT | 1 refills | Status: DC

## 2017-04-04 MED ORDER — SERTRALINE HCL 50 MG PO TABS: 25.0000 mg | ORAL_TABLET | Freq: Every day | ORAL | 2 refills | Status: DC

## 2017-04-04 MED ORDER — HYDROCHLOROTHIAZIDE 25 MG PO TABS: 25.0000 mg | ORAL_TABLET | Freq: Every day | ORAL | 1 refills | Status: DC

## 2017-04-04 MED ORDER — FLUTICASONE-SALMETEROL 250-50 MCG/DOSE IN AEPB: 1.0000 | INHALATION_SPRAY | Freq: Two times a day (BID) | RESPIRATORY_TRACT | 1 refills | Status: DC

## 2017-04-04 MED ORDER — CETIRIZINE HCL 10 MG PO TABS: 10.0000 mg | ORAL_TABLET | Freq: Every day | ORAL | 11 refills | Status: DC

## 2017-04-04 MED ORDER — METOPROLOL SUCCINATE ER 50 MG PO TB24: 50.0000 mg | ORAL_TABLET | Freq: Every day | ORAL | 1 refills | Status: DC

## 2017-04-04 MED ORDER — LOSARTAN POTASSIUM 100 MG PO TABS: 100.0000 mg | ORAL_TABLET | Freq: Every day | ORAL | 1 refills | Status: DC

## 2017-04-04 MED ORDER — ALBUTEROL SULFATE HFA 108 (90 BASE) MCG/ACT IN AERS: 2.0000 | INHALATION_SPRAY | Freq: Four times a day (QID) | RESPIRATORY_TRACT | 3 refills | Status: DC | PRN

## 2017-04-04 MED ORDER — CLOPIDOGREL BISULFATE 75 MG PO TABS: 75.0000 mg | ORAL_TABLET | Freq: Every day | ORAL | 1 refills | Status: DC

## 2017-04-04 MED ORDER — ATORVASTATIN CALCIUM 40 MG PO TABS: 40.0000 mg | ORAL_TABLET | Freq: Every day | ORAL | 1 refills | Status: DC

## 2017-04-04 NOTE — Progress Notes (Signed)
BP 138/75   Pulse 84   Temp 97.8 F (36.6 C) (Oral)   Wt 129 lb 9.6 oz (58.8 kg)   LMP  (LMP Unknown)   SpO2 95%   BMI 26.18 kg/m    Subjective:    Patient ID: Kristi Clark, female    DOB: 03/22/1950, 67 y.o.   MRN: [id number]  HPI: Kristi Clark is a 67 y.o. female  Chief Complaint  Patient presents with  . Hyperlipidemia  . Hypertension   Hypertension Using medications without difficulty Average home BPs   No problems or lightheadedness No chest pain with exertion or shortness of breath No Edema  Hyperlipidemia Using medications without problems: No Muscle aches  Diet compliance: Doesn't eat much Exercise: Stays busy  Abdominal pain Seen last week.  Thought to be IBS due to stress.  Husband was in the hospital and now home.  Given Carafate and it is helping but causing constipation.  Hasn't tried the probiotic yet.  States she gets nervous and overwhelmed a lot.    Depression screen [facility] 2/9 04/04/2017 10/03/2016 09/15/2015 03/06/2015  Decreased Interest 1 0 0 0  Down, Depressed, Hopeless 1 0 0 0  PHQ - 2 Score 2 0 0 0  Altered sleeping 0 0 - -  Tired, decreased energy 1 1 - -  Change in appetite 1 1 - -  Feeling bad or failure about yourself  0 0 - -  Trouble concentrating 0 0 - -  Moving slowly or fidgety/restless 0 0 - -  Suicidal thoughts 0 0 - -  PHQ-9 Score 4 2 - -   COPD Doing well.  Smoking more than she ever had due to stress  Relevant past medical, surgical, family and social history reviewed and updated as indicated. Interim medical history since our last visit reviewed. Allergies and medications reviewed and updated.  Review of Systems  HENT: Negative.   Respiratory: Negative.   Gastrointestinal: Negative.   Genitourinary: Negative.     Per HPI unless specifically indicated above     Objective:    BP 138/75   Pulse 84   Temp 97.8 F (36.6 C) (Oral)   Wt 129 lb 9.6 oz (58.8 kg)   LMP  (LMP Unknown)   SpO2 95%   BMI  26.18 kg/m   Wt Readings from Last 3 Encounters:  04/04/17 129 lb 9.6 oz (58.8 kg)  03/29/17 133 lb 6.4 oz (60.5 kg)  11/25/16 133 lb (60.3 kg)    Physical Exam  Constitutional: She is oriented to person, place, and time. She appears well-developed and well-nourished. No distress.  HENT:  Head: Normocephalic and atraumatic.  Eyes: Conjunctivae and lids are normal. Right eye exhibits no discharge. Left eye exhibits no discharge. No scleral icterus.  Neck: Normal range of motion. Neck supple. No JVD present. Carotid bruit is not present.  Cardiovascular: Normal rate, regular rhythm and normal heart sounds.  Pulmonary/Chest: Effort normal and breath sounds normal.  Abdominal: Normal appearance. There is no splenomegaly or hepatomegaly.  Musculoskeletal: Normal range of motion.  Neurological: She is alert and oriented to person, place, and time.  Skin: Skin is warm, dry and intact. No rash noted. No pallor.  Psychiatric: She has a normal mood and affect. Her behavior is normal. Judgment and thought content normal.    Results for orders placed or performed in visit on 03/29/17  CBC With Differential/Platelet  Result Value Ref Range   WBC 8.8 3.4 - 10.8 x10E3/uL  RBC 3.98 3.77 - 5.28 x10E6/uL   Hemoglobin 13.4 11.1 - 15.9 g/dL   Hematocrit 37.3 34.0 - 46.6 %   MCV 94 79 - 97 fL   MCH 33.7 (H) 26.6 - 33.0 pg   MCHC 35.9 (H) 31.5 - 35.7 g/dL   RDW 13.7 12.3 - 15.4 %   Platelets 228 150 - 379 x10E3/uL   Neutrophils 57 Not Estab. %   Lymphs 37 Not Estab. %   MID 6 Not Estab. %   Neutrophils Absolute 5.0 1.4 - 7.0 x10E3/uL   Lymphocytes Absolute 3.3 (H) 0.7 - 3.1 x10E3/uL   MID (Absolute) 0.5 0.1 - 1.6 X10E3/uL  UA/M w/rflx Culture, Routine  Result Value Ref Range   Specific Gravity, UA 1.015 1.005 - 1.030   pH, UA 6.5 5.0 - 7.5   Color, UA Yellow Yellow   Appearance Ur Clear Clear   Leukocytes, UA Negative Negative   Protein, UA Negative Negative/Trace   Glucose, UA Negative  Negative   Ketones, UA Trace (A) Negative   RBC, UA Negative Negative   Bilirubin, UA Negative Negative   Urobilinogen, Ur 0.2 0.2 - 1.0 mg/dL   Nitrite, UA Negative Negative      Assessment & Plan:   Problem List Items Addressed This Visit      Unprioritized   Benign hypertension with chronic kidney disease    Stable, continue present medications.        Relevant Medications   metoprolol succinate (TOPROL-XL) 50 MG 24 hr tablet   atorvastatin (LIPITOR) 40 MG tablet   hydrochlorothiazide (HYDRODIURIL) 25 MG tablet   losartan (COZAAR) 100 MG tablet   Hyperlipidemia    Stable, continue present medications.        Relevant Medications   metoprolol succinate (TOPROL-XL) 50 MG 24 hr tablet   atorvastatin (LIPITOR) 40 MG tablet   hydrochlorothiazide (HYDRODIURIL) 25 MG tablet   losartan (COZAAR) 100 MG tablet   IBS (irritable bowel syndrome)    Start Sertraline 25 mg as suspect related to stress.  Husband is ill and has chronic conditions      Relevant Medications   pantoprazole (PROTONIX) 20 MG tablet       Follow up plan: Return in about 6 weeks (around 05/16/2017).

## 2017-04-04 NOTE — Telephone Encounter (Signed)
Routing to provider. Can we print all of these prescriptions please?

## 2017-04-04 NOTE — Assessment & Plan Note (Signed)
Start Sertraline 25 mg as suspect related to stress.  Husband is ill and has chronic conditions

## 2017-04-04 NOTE — Addendum Note (Signed)
Addended by: Kathrine Haddock on: 04/04/2017 04:23 PM   Modules accepted: Orders

## 2017-04-04 NOTE — Telephone Encounter (Signed)
Patient needs script for the following because she has to mail them in   Clopidogrel 75mg  Fluticasone-salmeterol Sertraline 50mg  Tiotropium   She was just seen this afternoon  Thanks

## 2017-04-04 NOTE — Telephone Encounter (Signed)
Called and let patient know that RX's were ready to be picked up.

## 2017-04-04 NOTE — Assessment & Plan Note (Signed)
Stable, continue present medications.   

## 2017-04-04 NOTE — Telephone Encounter (Signed)
Patient needs sertraline RX printed out please.

## 2017-04-04 NOTE — Assessment & Plan Note (Signed)
Stable, continue present medications.  Smoking cessation encouraged

## 2017-05-19 ENCOUNTER — Ambulatory Visit: Payer: Medicare Other | Admitting: Unknown Physician Specialty

## 2017-06-26 DIAGNOSIS — I2510 Atherosclerotic heart disease of native coronary artery without angina pectoris: Secondary | ICD-10-CM | POA: Diagnosis not present

## 2017-06-26 DIAGNOSIS — R0602 Shortness of breath: Secondary | ICD-10-CM | POA: Diagnosis not present

## 2017-06-26 DIAGNOSIS — E782 Mixed hyperlipidemia: Secondary | ICD-10-CM | POA: Diagnosis not present

## 2017-06-26 DIAGNOSIS — I10 Essential (primary) hypertension: Secondary | ICD-10-CM | POA: Diagnosis not present

## 2017-06-28 DIAGNOSIS — R0602 Shortness of breath: Secondary | ICD-10-CM | POA: Diagnosis not present

## 2017-06-28 DIAGNOSIS — J449 Chronic obstructive pulmonary disease, unspecified: Secondary | ICD-10-CM | POA: Diagnosis not present

## 2017-08-09 ENCOUNTER — Ambulatory Visit (INDEPENDENT_AMBULATORY_CARE_PROVIDER_SITE_OTHER): Payer: Medicare Other | Admitting: Unknown Physician Specialty

## 2017-08-09 ENCOUNTER — Encounter: Payer: Self-pay | Admitting: Unknown Physician Specialty

## 2017-08-09 VITALS — BP 135/70 | HR 71 | Temp 97.7°F | Wt 130.6 lb

## 2017-08-09 DIAGNOSIS — M79644 Pain in right finger(s): Secondary | ICD-10-CM

## 2017-08-09 DIAGNOSIS — L989 Disorder of the skin and subcutaneous tissue, unspecified: Secondary | ICD-10-CM

## 2017-08-09 NOTE — Progress Notes (Signed)
BP 135/70   Pulse 71   Temp 97.7 F (36.5 C) (Oral)   Wt 130 lb 9.6 oz (59.2 kg)   LMP  (LMP Unknown)   SpO2 98%   BMI 26.38 kg/m    Subjective:    Patient ID: Kristi Clark, female    DOB: November 17, 1950, 67 y.o.   MRN: [id number]  HPI: Kristi Clark is a 67 y.o. female  Chief Complaint  Patient presents with  . Hand Pain    right hand pain for a month   . skin tag    pt states she has a skin tag above her left eye    Right hand Pt with right pointer finger since jamming it 1 month ago.  She states it is gradually improving.  Some days burns and aches.  No change in ROM.  Initially it was swollen.  Swelling is going down.   Skin lesion Pt with new lesion above left eye.  Started as a mole and now raised.     Relevant past medical, surgical, family and social history reviewed and updated as indicated. Interim medical history since our last visit reviewed. Allergies and medications reviewed and updated.  Review of Systems  Constitutional: Negative.   Respiratory: Negative.   Cardiovascular: Negative.   Gastrointestinal: Negative.   Musculoskeletal: Negative.   Psychiatric/Behavioral: Negative.        Did not take Zoloft.  Feels she is doing fine    Per HPI unless specifically indicated above     Objective:    BP 135/70   Pulse 71   Temp 97.7 F (36.5 C) (Oral)   Wt 130 lb 9.6 oz (59.2 kg)   LMP  (LMP Unknown)   SpO2 98%   BMI 26.38 kg/m   Wt Readings from Last 3 Encounters:  08/09/17 130 lb 9.6 oz (59.2 kg)  04/04/17 129 lb 9.6 oz (58.8 kg)  03/29/17 133 lb 6.4 oz (60.5 kg)    Physical Exam  Constitutional: She is oriented to person, place, and time. She appears well-developed and well-nourished. No distress.  HENT:  Head: Normocephalic and atraumatic.  Eyes: Conjunctivae and lids are normal. Right eye exhibits no discharge. Left eye exhibits no discharge. No scleral icterus.  Cardiovascular: Normal rate.  Pulmonary/Chest: Effort normal.    Abdominal: Normal appearance. There is no splenomegaly or hepatomegaly.  Musculoskeletal: Normal range of motion.  Neurological: She is alert and oriented to person, place, and time.  Skin: Skin is intact. No rash noted. No pallor.  Pt with a smooth raised lesion above left eye.    Psychiatric: She has a normal mood and affect. Her behavior is normal. Judgment and thought content normal.    Results for orders placed or performed in visit on 03/29/17  CBC With Differential/Platelet  Result Value Ref Range   WBC 8.8 3.4 - 10.8 x10E3/uL   RBC 3.98 3.77 - 5.28 x10E6/uL   Hemoglobin 13.4 11.1 - 15.9 g/dL   Hematocrit 37.3 34.0 - 46.6 %   MCV 94 79 - 97 fL   MCH 33.7 (H) 26.6 - 33.0 pg   MCHC 35.9 (H) 31.5 - 35.7 g/dL   RDW 13.7 12.3 - 15.4 %   Platelets 228 150 - 379 x10E3/uL   Neutrophils 57 Not Estab. %   Lymphs 37 Not Estab. %   MID 6 Not Estab. %   Neutrophils Absolute 5.0 1.4 - 7.0 x10E3/uL   Lymphocytes Absolute 3.3 (H) 0.7 - 3.1 x10E3/uL  MID (Absolute) 0.5 0.1 - 1.6 X10E3/uL  UA/M w/rflx Culture, Routine  Result Value Ref Range   Specific Gravity, UA 1.015 1.005 - 1.030   pH, UA 6.5 5.0 - 7.5   Color, UA Yellow Yellow   Appearance Ur Clear Clear   Leukocytes, UA Negative Negative   Protein, UA Negative Negative/Trace   Glucose, UA Negative Negative   Ketones, UA Trace (A) Negative   RBC, UA Negative Negative   Bilirubin, UA Negative Negative   Urobilinogen, Ur 0.2 0.2 - 1.0 mg/dL   Nitrite, UA Negative Negative      Assessment & Plan:   Problem List Items Addressed This Visit      Unprioritized   Pain of finger of right hand    New problem.  Right pointer MTP joint.  I don't think an x-ray would help.  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  In general, joint is non-tender.  Recommended asper cream.  Call in Voltaren gel if that does not work       Other Visit Diagnoses    Skin lesion    -  Primary   New problem.  Discussed that it did not appear malignent.   Pt concerned it is enlarging.  Refer to dermatolgy for further management   Relevant Orders   Ambulatory referral to Dermatology       Follow up plan: Return if symptoms worsen or fail to improve, for Schedule PE in August.

## 2017-08-09 NOTE — Assessment & Plan Note (Addendum)
New problem.  Right pointer MTP joint.  I don't think an x-ray would help.  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  In general, joint is non-tender.  Recommended asper cream.  Call in Voltaren gel if that does not work

## 2017-08-26 DIAGNOSIS — J0110 Acute frontal sinusitis, unspecified: Secondary | ICD-10-CM | POA: Diagnosis not present

## 2017-08-30 ENCOUNTER — Ambulatory Visit (INDEPENDENT_AMBULATORY_CARE_PROVIDER_SITE_OTHER): Payer: Medicare Other | Admitting: Unknown Physician Specialty

## 2017-08-30 ENCOUNTER — Encounter: Payer: Self-pay | Admitting: Unknown Physician Specialty

## 2017-08-30 VITALS — BP 104/57 | HR 96 | Temp 98.6°F | Ht 59.0 in | Wt 129.0 lb

## 2017-08-30 DIAGNOSIS — J209 Acute bronchitis, unspecified: Secondary | ICD-10-CM | POA: Diagnosis not present

## 2017-08-30 DIAGNOSIS — T889XXA Complication of surgical and medical care, unspecified, initial encounter: Secondary | ICD-10-CM | POA: Diagnosis not present

## 2017-08-30 DIAGNOSIS — J42 Unspecified chronic bronchitis: Secondary | ICD-10-CM | POA: Diagnosis not present

## 2017-08-30 MED ORDER — PREDNISONE 20 MG PO TABS: 40.0000 mg | ORAL_TABLET | Freq: Every day | ORAL | 0 refills | Status: DC

## 2017-08-30 NOTE — Progress Notes (Signed)
BP (!) 104/57   Pulse 96   Temp 98.6 F (37 C) (Oral)   Ht 4\' 11"  (1.499 m)   Wt 129 lb (58.5 kg)   LMP  (LMP Unknown)   SpO2 98%   BMI 26.05 kg/m    Subjective:    Patient ID: Kristi Clark, female    DOB: 10-30-1950, 67 y.o.   MRN: [id number]  HPI: Kristi Clark is a 67 y.o. female  Chief Complaint  Patient presents with  . URI    pt states she went to [facility] Saturday, given a z-pak. States she still doesn't feel good and has a cough   Cough  This is a new problem. Episode onset: since last week.  Took a Zpack through the walk in clinic 4 days ago. Progression since onset: starting the cough.  GI effects to Zithromax. The cough is productive of sputum. Associated symptoms include chills and nasal congestion. Pertinent negatives include no chest pain, ear congestion, ear pain, fever, headaches, heartburn, hemoptysis, myalgias, postnasal drip, rash, rhinorrhea, sore throat, shortness of breath, sweats, weight loss or wheezing. Nothing aggravates the symptoms. She has tried nothing for the symptoms. Her past medical history is significant for emphysema.    Relevant past medical, surgical, family and social history reviewed and updated as indicated. Interim medical history since our last visit reviewed. Allergies and medications reviewed and updated.  Review of Systems  Constitutional: Positive for chills. Negative for fever and weight loss.  HENT: Negative for ear pain, postnasal drip, rhinorrhea and sore throat.   Respiratory: Positive for cough. Negative for hemoptysis, shortness of breath and wheezing.   Cardiovascular: Negative for chest pain.  Gastrointestinal: Negative for heartburn.  Musculoskeletal: Negative for myalgias.  Skin: Negative for rash.  Neurological: Negative for headaches.    Per HPI unless specifically indicated above     Objective:    BP (!) 104/57   Pulse 96   Temp 98.6 F (37 C) (Oral)   Ht 4\' 11"  (1.499 m)   Wt 129 lb (58.5 kg)    LMP  (LMP Unknown)   SpO2 98%   BMI 26.05 kg/m   Wt Readings from Last 3 Encounters:  08/30/17 129 lb (58.5 kg)  08/09/17 130 lb 9.6 oz (59.2 kg)  04/04/17 129 lb 9.6 oz (58.8 kg)    Physical Exam  Constitutional: She is oriented to person, place, and time. She appears well-developed and well-nourished. No distress.  HENT:  Head: Normocephalic and atraumatic.  Right Ear: Tympanic membrane and ear canal normal.  Left Ear: Tympanic membrane and ear canal normal.  Nose: Rhinorrhea present. Right sinus exhibits no maxillary sinus tenderness and no frontal sinus tenderness. Left sinus exhibits no maxillary sinus tenderness and no frontal sinus tenderness.  Mouth/Throat: Mucous membranes are normal. Posterior oropharyngeal erythema present.  Eyes: Conjunctivae and lids are normal. Right eye exhibits no discharge. Left eye exhibits no discharge. No scleral icterus.  Cardiovascular: Normal rate and regular rhythm.  Pulmonary/Chest: Effort normal. No respiratory distress. She has decreased breath sounds. She has no wheezes.  Abdominal: Normal appearance. There is no splenomegaly or hepatomegaly.  Musculoskeletal: Normal range of motion.  Neurological: She is alert and oriented to person, place, and time.  Skin: Skin is intact. No rash noted. No pallor.  Psychiatric: She has a normal mood and affect. Her behavior is normal. Judgment and thought content normal.    Results for orders placed or performed in visit on 03/29/17  CBC With  Differential/Platelet  Result Value Ref Range   WBC 8.8 3.4 - 10.8 x10E3/uL   RBC 3.98 3.77 - 5.28 x10E6/uL   Hemoglobin 13.4 11.1 - 15.9 g/dL   Hematocrit 37.3 34.0 - 46.6 %   MCV 94 79 - 97 fL   MCH 33.7 (H) 26.6 - 33.0 pg   MCHC 35.9 (H) 31.5 - 35.7 g/dL   RDW 13.7 12.3 - 15.4 %   Platelets 228 150 - 379 x10E3/uL   Neutrophils 57 Not Estab. %   Lymphs 37 Not Estab. %   MID 6 Not Estab. %   Neutrophils Absolute 5.0 1.4 - 7.0 x10E3/uL   Lymphocytes  Absolute 3.3 (H) 0.7 - 3.1 x10E3/uL   MID (Absolute) 0.5 0.1 - 1.6 X10E3/uL  UA/M w/rflx Culture, Routine  Result Value Ref Range   Specific Gravity, UA 1.015 1.005 - 1.030   pH, UA 6.5 5.0 - 7.5   Color, UA Yellow Yellow   Appearance Ur Clear Clear   Leukocytes, UA Negative Negative   Protein, UA Negative Negative/Trace   Glucose, UA Negative Negative   Ketones, UA Trace (A) Negative   RBC, UA Negative Negative   Bilirubin, UA Negative Negative   Urobilinogen, Ur 0.2 0.2 - 1.0 mg/dL   Nitrite, UA Negative Negative      Assessment & Plan:   Problem List Items Addressed This Visit    None    Visit Diagnoses    Acute exacerbation of chronic bronchitis ([place])    -  Primary   New problem.  Partially treated with Zithromax.  Continue Spireva and Albuterol inhaler. Add Prednisone burst of 40 mg daily for 5 days.    Relevant Medications   predniSONE (DELTASONE) 20 MG tablet   Side effects of treatment, initial encounter       completed Zithromax with diarrhea and stomach pain.  Will  not add additional antibiotics at this time.  Discussed probiotics.         Follow up plan: Return if symptoms worsen or fail to improve.

## 2017-09-12 ENCOUNTER — Ambulatory Visit: Payer: Self-pay | Admitting: *Deleted

## 2017-09-12 NOTE — Telephone Encounter (Signed)
Pt called with several complaints, abd pain, mouth sores and swelling in her legs and feet. The abd pain she thinks started with the antibiotic that she was taking for an infection in her lungs, causing cramps. She went to urgent care for that.  She said that she thinks she has mouth sores because her mouth has been so dry. But now she has noticed white patches in her mouth and hurts to eat or drink some things. Been using a mouthwash that is helping.  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  Home care advice given with verbal understanding. Appointment scheduled per protocol. Will route to flow at [facility].  Reason for Disposition . [1] MODERATE leg swelling (e.g., swelling extends up to knees) AND [2] new onset or worsening  Answer Assessment - Initial Assessment Questions 1. SYMPTOM: "What's the main symptom you're concerned about?" (e.g., dry mouth. chapped lips, lump)     Dry mouth, thrush 2. ONSET: "When did the mouth sores  start?"     Saturday 3. PAIN: "Is there any pain?" If so, ask: "How bad is it?" (Scale: 1-10; mild, moderate, severe)     4 now and day before 6 4. CAUSE: "What do you think is causing the symptoms?"     thrush 5. OTHER SYMPTOMS: "Do you have any other symptoms?" (e.g., fever, sore throat, toothache, swelling)     Swelling top on the left where dentures fit  Answer Assessment - Initial Assessment Questions 1. ONSET: "When did the swelling start?" (e.g., minutes, hours, days)     Last Thursday or Friday 2. LOCATION: "What part of the leg is swollen?"  "Are both legs swollen or just one leg?"     Both legs from the knees down 3. SEVERITY: "How bad is the swelling?" (e.g., localized; mild, moderate, severe)  - Localized - small area of swelling localized to one leg  - MILD pedal edema - swelling limited to foot and ankle, pitting edema < 1/4 inch (6 mm) deep,  rest and elevation eliminate most or all swelling  - MODERATE edema - swelling of lower leg to knee, pitting edema > 1/4 inch (6 mm) deep, rest and elevation only partially reduce swelling  - SEVERE edema - swelling extends above knee, facial or hand swelling present      moderate 4. REDNESS: "Does the swelling look red or infected?"     Little redness on toes 5. PAIN: "Is the swelling painful to touch?" If so, ask: "How painful is it?"   (Scale 1-10; mild, moderate or severe)     No just burning 6. FEVER: "Do you have a fever?" If so, ask: "What is it, how was it measured, and when did it start?"      no 7. CAUSE: "What do you think is causing the leg swelling?"     Standing, being on feet too much 8. MEDICAL HISTORY: "Do you have a history of heart failure, kidney disease, liver failure, or cancer?"     no 9. RECURRENT SYMPTOM: "Have you had leg swelling before?" If so, ask: "When was the last time?" "What happened that time?"     no 10. OTHER SYMPTOMS: "Do you have any other symptoms?" (e.g., chest pain, difficulty breathing)       no  Protocols used: LEG SWELLING AND EDEMA-A-AH, MOUTH SYMPTOMS-A-AH

## 2017-09-13 ENCOUNTER — Ambulatory Visit (INDEPENDENT_AMBULATORY_CARE_PROVIDER_SITE_OTHER): Payer: Medicare Other | Admitting: Unknown Physician Specialty

## 2017-09-13 ENCOUNTER — Encounter: Payer: Self-pay | Admitting: Unknown Physician Specialty

## 2017-09-13 VITALS — BP 138/72 | HR 80 | Temp 98.3°F | Ht 59.0 in | Wt 127.2 lb

## 2017-09-13 DIAGNOSIS — M25476 Effusion, unspecified foot: Secondary | ICD-10-CM | POA: Diagnosis not present

## 2017-09-13 DIAGNOSIS — M25473 Effusion, unspecified ankle: Secondary | ICD-10-CM

## 2017-09-13 DIAGNOSIS — F439 Reaction to severe stress, unspecified: Secondary | ICD-10-CM

## 2017-09-13 DIAGNOSIS — B370 Candidal stomatitis: Secondary | ICD-10-CM

## 2017-09-13 DIAGNOSIS — M25475 Effusion, left foot: Secondary | ICD-10-CM

## 2017-09-13 MED ORDER — FUROSEMIDE 20 MG PO TABS: 20.0000 mg | ORAL_TABLET | Freq: Every day | ORAL | 0 refills | Status: DC

## 2017-09-13 MED ORDER — NYSTATIN 100000 UNIT/ML MT SUSP: 5.0000 mL | Freq: Four times a day (QID) | OROMUCOSAL | 0 refills | Status: DC

## 2017-09-13 NOTE — Progress Notes (Signed)
BP 138/72   Pulse 80   Temp 98.3 F (36.8 C) (Oral)   Ht 4\' 11"  (1.499 m)   Wt 127 lb 3.2 oz (57.7 kg)   LMP  (LMP Unknown)   SpO2 97%   BMI 25.69 kg/m    Subjective:    Patient ID: Kristi Clark, female    DOB: 1950/03/31, 67 y.o.   MRN: [id number]  HPI: Kristi Clark is a 67 y.o. female  Chief Complaint  Patient presents with  . Thrush    pt states she had thrush in her mouth, states it is better because of a mouthwash she was using  . Edema    pt states she was having bilateral feet and ankle edema, states it is better since elevating her feet   Pt was seen on 6/26 for a cough.  Given Zithromax previously at walk-in clinic and prednisone from me on that date.  States both the Zithromax and Prednisone make her sick with nausea and vomiting.  States the stomach issues are resolved.  He ankle swelling has improved.  Mouth is dry but seems to be better.    She does admit to stress with care of step mother, husband, and worries for her family.  Has Zoloft but hasn't started it yet. Tearful today as she is tired and exhausted.       Relevant past medical, surgical, family and social history reviewed and updated as indicated. Interim medical history since our last visit reviewed. Allergies and medications reviewed and updated.  Review of Systems  Per HPI unless specifically indicated above     Objective:    BP 138/72   Pulse 80   Temp 98.3 F (36.8 C) (Oral)   Ht 4\' 11"  (1.499 m)   Wt 127 lb 3.2 oz (57.7 kg)   LMP  (LMP Unknown)   SpO2 97%   BMI 25.69 kg/m   Wt Readings from Last 3 Encounters:  09/13/17 127 lb 3.2 oz (57.7 kg)  08/30/17 129 lb (58.5 kg)  08/09/17 130 lb 9.6 oz (59.2 kg)    Physical Exam  Constitutional: She is oriented to person, place, and time. She appears well-developed and well-nourished. No distress.  HENT:  Head: Normocephalic and atraumatic.  Mouth/Throat: No uvula swelling.  Dry mucous membranes  Eyes: Conjunctivae and lids  are normal. Right eye exhibits no discharge. Left eye exhibits no discharge. No scleral icterus.  Neck: Normal range of motion. Neck supple. No JVD present. Carotid bruit is not present.  Cardiovascular: Normal rate, regular rhythm and normal heart sounds.  Pulmonary/Chest: Effort normal and breath sounds normal.  Abdominal: Normal appearance. There is no splenomegaly or hepatomegaly.  Musculoskeletal: Normal range of motion.  Trace edema bilateral ankles  Neurological: She is alert and oriented to person, place, and time.  Skin: Skin is warm, dry and intact. No rash noted. No pallor.  Psychiatric: She has a normal mood and affect. Her behavior is normal. Judgment and thought content normal.    Results for orders placed or performed in visit on 03/29/17  CBC With Differential/Platelet  Result Value Ref Range   WBC 8.8 3.4 - 10.8 x10E3/uL   RBC 3.98 3.77 - 5.28 x10E6/uL   Hemoglobin 13.4 11.1 - 15.9 g/dL   Hematocrit 37.3 34.0 - 46.6 %   MCV 94 79 - 97 fL   MCH 33.7 (H) 26.6 - 33.0 pg   MCHC 35.9 (H) 31.5 - 35.7 g/dL   RDW 13.7 12.3 -  15.4 %   Platelets 228 150 - 379 x10E3/uL   Neutrophils 57 Not Estab. %   Lymphs 37 Not Estab. %   MID 6 Not Estab. %   Neutrophils Absolute 5.0 1.4 - 7.0 x10E3/uL   Lymphocytes Absolute 3.3 (H) 0.7 - 3.1 x10E3/uL   MID (Absolute) 0.5 0.1 - 1.6 X10E3/uL  UA/M w/rflx Culture, Routine  Result Value Ref Range   Specific Gravity, UA 1.015 1.005 - 1.030   pH, UA 6.5 5.0 - 7.5   Color, UA Yellow Yellow   Appearance Ur Clear Clear   Leukocytes, UA Negative Negative   Protein, UA Negative Negative/Trace   Glucose, UA Negative Negative   Ketones, UA Trace (A) Negative   RBC, UA Negative Negative   Bilirubin, UA Negative Negative   Urobilinogen, Ur 0.2 0.2 - 1.0 mg/dL   Nitrite, UA Negative Negative      Assessment & Plan:   Problem List Items Addressed This Visit      Unprioritized   Stress    Planning on starting Zoloft previously prescribed.          Other Visit Diagnoses    Bilateral swelling of feet and ankles    -  Primary   suspect related to prednisone.  Lungs clear to auscultation.  Taking HCTZ.  Will rx Furosemide for occasional use.  Encouraged compression stockings   Thrush, oral       Rx for Nystatin swish and spit   Relevant Medications   nystatin (MYCOSTATIN) 100000 UNIT/ML suspension       Follow up plan: Return if symptoms worsen or fail to improve.

## 2017-09-13 NOTE — Assessment & Plan Note (Signed)
Planning on starting Zoloft previously prescribed.

## 2017-09-18 ENCOUNTER — Other Ambulatory Visit: Payer: Self-pay | Admitting: Unknown Physician Specialty

## 2017-09-18 DIAGNOSIS — Z1231 Encounter for screening mammogram for malignant neoplasm of breast: Secondary | ICD-10-CM

## 2017-09-21 ENCOUNTER — Ambulatory Visit (INDEPENDENT_AMBULATORY_CARE_PROVIDER_SITE_OTHER): Payer: Medicare Other | Admitting: Family Medicine

## 2017-09-21 ENCOUNTER — Encounter: Payer: Self-pay | Admitting: Family Medicine

## 2017-09-21 VITALS — BP 124/55 | HR 71 | Temp 98.0°F | Ht 59.0 in | Wt 126.0 lb

## 2017-09-21 DIAGNOSIS — F321 Major depressive disorder, single episode, moderate: Secondary | ICD-10-CM | POA: Diagnosis not present

## 2017-09-21 DIAGNOSIS — J069 Acute upper respiratory infection, unspecified: Secondary | ICD-10-CM

## 2017-09-21 MED ORDER — AZITHROMYCIN 250 MG PO TABS: ORAL_TABLET | ORAL | 0 refills | Status: DC

## 2017-09-21 NOTE — Progress Notes (Signed)
BP (!) 124/55 (BP Location: Right Arm, Patient Position: Sitting, Cuff Size: Normal)   Pulse 71   Temp 98 F (36.7 C) (Oral)   Ht 4\' 11"  (1.499 m)   Wt 126 lb (57.2 kg)   LMP  (LMP Unknown)   SpO2 98%   BMI 25.45 kg/m    Subjective:    Patient ID: Kristi Clark, female    DOB: 06-21-1950, 67 y.o.   MRN: [id number]  HPI: Kristi Clark is a 67 y.o. female  Chief Complaint  Patient presents with  . Fatigue    Ongoing for 1 week.  . Cough    Productive. Patient coughed all night long.  . Sinusitis   Was treated a month ago for sinusitis with azithromycin, did not improve so then got prednisone. Feels like she's never really gotten fully better. Fatigue, eye pressure, HAs, productive cough, congestion x 1-2 weeks. Still taking flonase and zyrtec daily. Also using delsym. Denies fevers, CP, SOB, wheezing.   Only took one zoloft after they were given a week or two ago, feels like she's doing a good job dealing with all of her stress. Does not wish to be on any medications at this time.   Past Medical History:  Diagnosis Date  . Depression   . Hyperlipidemia   . Menopause   . Osteopenia   . Tobacco abuse    Social History   Socioeconomic History  . Marital status: Married    Spouse name: Not on file  . Number of children: Not on file  . Years of education: Not on file  . Highest education level: Not on file  Occupational History  . Not on file  Social Needs  . Financial resource strain: Not on file  . Food insecurity:    Worry: Not on file    Inability: Not on file  . Transportation needs:    Medical: Not on file    Non-medical: Not on file  Tobacco Use  . Smoking status: Current Every Day Smoker    Packs/day: 1.00    Types: Cigarettes  . Smokeless tobacco: Never Used  Substance and Sexual Activity  . Alcohol use: No  . Drug use: No  . Sexual activity: Never  Lifestyle  . Physical activity:    Days per week: Not on file    Minutes per session:  Not on file  . Stress: Not on file  Relationships  . Social connections:    Talks on phone: Not on file    Gets together: Not on file    Attends religious service: Not on file    Active member of club or organization: Not on file    Attends meetings of clubs or organizations: Not on file    Relationship status: Not on file  . Intimate partner violence:    Fear of current or ex partner: Not on file    Emotionally abused: Not on file    Physically abused: Not on file    Forced sexual activity: Not on file  Other Topics Concern  . Not on file  Social History Narrative  . Not on file    Relevant past medical, surgical, family and social history reviewed and updated as indicated. Interim medical history since our last visit reviewed. Allergies and medications reviewed and updated.  Review of Systems  Per HPI unless specifically indicated above     Objective:    BP (!) 124/55 (BP Location: Right Arm, Patient Position: Sitting,  Cuff Size: Normal)   Pulse 71   Temp 98 F (36.7 C) (Oral)   Ht 4\' 11"  (1.499 m)   Wt 126 lb (57.2 kg)   LMP  (LMP Unknown)   SpO2 98%   BMI 25.45 kg/m   Wt Readings from Last 3 Encounters:  09/21/17 126 lb (57.2 kg)  09/13/17 127 lb 3.2 oz (57.7 kg)  08/30/17 129 lb (58.5 kg)    Physical Exam  Constitutional: She is oriented to person, place, and time. She appears well-developed and well-nourished. No distress.  HENT:  Head: Atraumatic.  Right Ear: External ear normal.  Left Ear: External ear normal.  Oropharynx and nasal mucosa erythematous and edematous  Eyes: Pupils are equal, round, and reactive to light. Conjunctivae and EOM are normal.  Neck: Normal range of motion. Neck supple.  Cardiovascular: Normal rate and normal heart sounds.  Pulmonary/Chest: Effort normal and breath sounds normal. No respiratory distress. She has no wheezes. She has no rales.  Musculoskeletal: Normal range of motion.  Lymphadenopathy:    She has no cervical  adenopathy.  Neurological: She is alert and oriented to person, place, and time.  Skin: Skin is warm and dry.  Psychiatric: She has a normal mood and affect. Her behavior is normal.  Nursing note and vitals reviewed.   Results for orders placed or performed in visit on 03/29/17  CBC With Differential/Platelet  Result Value Ref Range   WBC 8.8 3.4 - 10.8 x10E3/uL   RBC 3.98 3.77 - 5.28 x10E6/uL   Hemoglobin 13.4 11.1 - 15.9 g/dL   Hematocrit 37.3 34.0 - 46.6 %   MCV 94 79 - 97 fL   MCH 33.7 (H) 26.6 - 33.0 pg   MCHC 35.9 (H) 31.5 - 35.7 g/dL   RDW 13.7 12.3 - 15.4 %   Platelets 228 150 - 379 x10E3/uL   Neutrophils 57 Not Estab. %   Lymphs 37 Not Estab. %   MID 6 Not Estab. %   Neutrophils Absolute 5.0 1.4 - 7.0 x10E3/uL   Lymphocytes Absolute 3.3 (H) 0.7 - 3.1 x10E3/uL   MID (Absolute) 0.5 0.1 - 1.6 X10E3/uL  UA/M w/rflx Culture, Routine  Result Value Ref Range   Specific Gravity, UA 1.015 1.005 - 1.030   pH, UA 6.5 5.0 - 7.5   Color, UA Yellow Yellow   Appearance Ur Clear Clear   Leukocytes, UA Negative Negative   Protein, UA Negative Negative/Trace   Glucose, UA Negative Negative   Ketones, UA Trace (A) Negative   RBC, UA Negative Negative   Bilirubin, UA Negative Negative   Urobilinogen, Ur 0.2 0.2 - 1.0 mg/dL   Nitrite, UA Negative Negative      Assessment & Plan:   Problem List Items Addressed This Visit    None    Visit Diagnoses    Upper respiratory tract infection, unspecified type    -  Primary   Will tx with zpack, flonase, zyrtec. Supportive care reviewed. F/u if worsening or no improvement   Relevant Medications   azithromycin (ZITHROMAX) 250 MG tablet   Depression, major, single episode, moderate (HCC)       Self d/c'd her zoloft, does not wish to continue on medications. Will continue to monitor       Follow up plan: Return for as scheduled.

## 2017-09-24 NOTE — Patient Instructions (Signed)
Follow up as needed

## 2017-10-04 DIAGNOSIS — I8393 Asymptomatic varicose veins of bilateral lower extremities: Secondary | ICD-10-CM | POA: Diagnosis not present

## 2017-10-04 DIAGNOSIS — L821 Other seborrheic keratosis: Secondary | ICD-10-CM | POA: Diagnosis not present

## 2017-10-04 DIAGNOSIS — Z1283 Encounter for screening for malignant neoplasm of skin: Secondary | ICD-10-CM | POA: Diagnosis not present

## 2017-10-04 DIAGNOSIS — D229 Melanocytic nevi, unspecified: Secondary | ICD-10-CM | POA: Diagnosis not present

## 2017-10-04 DIAGNOSIS — L308 Other specified dermatitis: Secondary | ICD-10-CM | POA: Diagnosis not present

## 2017-10-04 DIAGNOSIS — L814 Other melanin hyperpigmentation: Secondary | ICD-10-CM | POA: Diagnosis not present

## 2017-10-04 DIAGNOSIS — D485 Neoplasm of uncertain behavior of skin: Secondary | ICD-10-CM | POA: Diagnosis not present

## 2017-10-06 ENCOUNTER — Ambulatory Visit: Payer: Medicare Other | Admitting: Unknown Physician Specialty

## 2017-10-06 ENCOUNTER — Ambulatory Visit

## 2017-10-06 ENCOUNTER — Ambulatory Visit: Payer: Medicare Other | Admitting: Family Medicine

## 2017-10-11 ENCOUNTER — Ambulatory Visit (INDEPENDENT_AMBULATORY_CARE_PROVIDER_SITE_OTHER): Payer: Medicare Other | Admitting: Family Medicine

## 2017-10-11 ENCOUNTER — Ambulatory Visit (INDEPENDENT_AMBULATORY_CARE_PROVIDER_SITE_OTHER): Payer: Medicare Other

## 2017-10-11 ENCOUNTER — Encounter: Payer: Self-pay | Admitting: Family Medicine

## 2017-10-11 VITALS — BP 132/52 | HR 87 | Temp 97.7°F | Resp 16 | Ht <= 58 in | Wt 126.7 lb

## 2017-10-11 VITALS — BP 113/67 | HR 87 | Temp 97.7°F | Ht <= 58 in | Wt 126.7 lb

## 2017-10-11 DIAGNOSIS — Z720 Tobacco use: Secondary | ICD-10-CM

## 2017-10-11 DIAGNOSIS — J449 Chronic obstructive pulmonary disease, unspecified: Secondary | ICD-10-CM

## 2017-10-11 DIAGNOSIS — I129 Hypertensive chronic kidney disease with stage 1 through stage 4 chronic kidney disease, or unspecified chronic kidney disease: Secondary | ICD-10-CM

## 2017-10-11 DIAGNOSIS — E785 Hyperlipidemia, unspecified: Secondary | ICD-10-CM

## 2017-10-11 DIAGNOSIS — Z0000 Encounter for general adult medical examination without abnormal findings: Secondary | ICD-10-CM | POA: Diagnosis not present

## 2017-10-11 MED ORDER — CLOPIDOGREL BISULFATE 75 MG PO TABS: 75.0000 mg | ORAL_TABLET | Freq: Every day | ORAL | 1 refills | Status: DC

## 2017-10-11 MED ORDER — FLUTICASONE-SALMETEROL 250-50 MCG/DOSE IN AEPB: 1.0000 | INHALATION_SPRAY | Freq: Two times a day (BID) | RESPIRATORY_TRACT | 1 refills | Status: DC

## 2017-10-11 MED ORDER — FUROSEMIDE 20 MG PO TABS
20.0000 mg | ORAL_TABLET | Freq: Every day | ORAL | 0 refills | Status: DC
Start: 2017-10-11 — End: 2018-01-15

## 2017-10-11 MED ORDER — ISOSORBIDE DINITRATE 30 MG PO TABS: 30.0000 mg | ORAL_TABLET | Freq: Every day | ORAL | 1 refills | Status: DC

## 2017-10-11 MED ORDER — LOSARTAN POTASSIUM 100 MG PO TABS: 100.0000 mg | ORAL_TABLET | Freq: Every day | ORAL | 1 refills | Status: DC

## 2017-10-11 MED ORDER — ALBUTEROL SULFATE HFA 108 (90 BASE) MCG/ACT IN AERS: 2.0000 | INHALATION_SPRAY | Freq: Four times a day (QID) | RESPIRATORY_TRACT | 3 refills | Status: DC | PRN

## 2017-10-11 MED ORDER — METOPROLOL SUCCINATE ER 50 MG PO TB24
50.0000 mg | ORAL_TABLET | Freq: Every day | ORAL | 1 refills | Status: DC
Start: 2017-10-11 — End: 2018-05-02

## 2017-10-11 MED ORDER — HYDROCHLOROTHIAZIDE 25 MG PO TABS
25.0000 mg | ORAL_TABLET | Freq: Every day | ORAL | 1 refills | Status: DC
Start: 2017-10-11 — End: 2018-05-02

## 2017-10-11 MED ORDER — ATORVASTATIN CALCIUM 40 MG PO TABS: 40.0000 mg | ORAL_TABLET | Freq: Every day | ORAL | 1 refills | Status: DC

## 2017-10-11 MED ORDER — TIOTROPIUM BROMIDE MONOHYDRATE 18 MCG IN CAPS: 18.0000 ug | ORAL_CAPSULE | Freq: Every day | RESPIRATORY_TRACT | 1 refills | Status: DC

## 2017-10-11 MED ORDER — FLUTICASONE PROPIONATE 50 MCG/ACT NA SUSP: 2.0000 | Freq: Every day | NASAL | 3 refills | Status: DC

## 2017-10-11 NOTE — Patient Instructions (Addendum)
Kristi Clark , Thank you for taking time to come for your Medicare Wellness Visit. I appreciate your ongoing commitment to your health goals. Please review the following plan we discussed and let me know if I can assist you in the future.   Screening recommendations/referrals: Colonoscopy: completed 12/07/2012 Mammogram: scheduled 10/23/2017 Bone Density: completed 08/22/2012 Recommended yearly ophthalmology/optometry visit for glaucoma screening and checkup Recommended yearly dental visit for hygiene and checkup  Vaccinations: Influenza vaccine: due 11/2017 Pneumococcal vaccine: completed series Tdap vaccine: up to date Shingles vaccine: shingrix eligible, check with your insurance company for coverage     Advanced directives: Advance directive discussed with you today. I have provided a copy for you to complete at home and have notarized. Once this is complete please bring a copy in to our office so we can scan it into your chart.  Conditions/risks identified: Smoking cessation discussed  Next appointment: Follow up in one year for your annual wellness exam.     Preventive Care 69 Years and Older, Female Preventive care refers to lifestyle choices and visits with your health care provider that can promote health and wellness. What does preventive care include?  A yearly physical exam. This is also called an annual well check.  Dental exams once or twice a year.  Routine eye exams. Ask your health care provider how often you should have your eyes checked.  Personal lifestyle choices, including:  Daily care of your teeth and gums.  Regular physical activity.  Eating a healthy diet.  Avoiding tobacco and drug use.  Limiting alcohol use.  Practicing safe sex.  Taking low-dose aspirin every day.  Taking vitamin and mineral supplements as recommended by your health care provider. What happens during an annual well check? The services and screenings done by your health care  provider during your annual well check will depend on your age, overall health, lifestyle risk factors, and family history of disease. Counseling  Your health care provider may ask you questions about your:  Alcohol use.  Tobacco use.  Drug use.  Emotional well-being.  Home and relationship well-being.  Sexual activity.  Eating habits.  History of falls.  Memory and ability to understand (cognition).  Work and work Statistician.  Reproductive health. Screening  You may have the following tests or measurements:  Height, weight, and BMI.  Blood pressure.  Lipid and cholesterol levels. These may be checked every 5 years, or more frequently if you are over 29 years old.  Skin check.  Lung cancer screening. You may have this screening every year starting at age 69 if you have a 30-pack-year history of smoking and currently smoke or have quit within the past 15 years.  Fecal occult blood test (FOBT) of the stool. You may have this test every year starting at age 69.  Flexible sigmoidoscopy or colonoscopy. You may have a sigmoidoscopy every 5 years or a colonoscopy every 10 years starting at age 65.  Hepatitis C blood test.  Hepatitis B blood test.  Sexually transmitted disease (STD) testing.  Diabetes screening. This is done by checking your blood sugar (glucose) after you have not eaten for a while (fasting). You may have this done every 1-3 years.  Bone density scan. This is done to screen for osteoporosis. You may have this done starting at age 78.  Mammogram. This may be done every 1-2 years. Talk to your health care provider about how often you should have regular mammograms. Talk with your health care provider about  your test results, treatment options, and if necessary, the need for more tests. Vaccines  Your health care provider may recommend certain vaccines, such as:  Influenza vaccine. This is recommended every year.  Tetanus, diphtheria, and acellular  pertussis (Tdap, Td) vaccine. You may need a Td booster every 10 years.  Zoster vaccine. You may need this after age 66.  Pneumococcal 13-valent conjugate (PCV13) vaccine. One dose is recommended after age 50.  Pneumococcal polysaccharide (PPSV23) vaccine. One dose is recommended after age 19. Talk to your health care provider about which screenings and vaccines you need and how often you need them. This information is not intended to replace advice given to you by your health care provider. Make sure you discuss any questions you have with your health care provider. Document Released: 03/20/2015 Document Revised: 11/11/2015 Document Reviewed: 12/23/2014 Elsevier Interactive Patient Education  2017 [place] Prevention in the Home Falls can cause injuries. They can happen to people of all ages. There are many things you can do to make your home safe and to help prevent falls. What can I do on the outside of my home?  Regularly fix the edges of walkways and driveways and fix any cracks.  Remove anything that might make you trip as you walk through a door, such as a raised step or threshold.  Trim any bushes or trees on the path to your home.  Use bright outdoor lighting.  Clear any walking paths of anything that might make someone trip, such as rocks or tools.  Regularly check to see if handrails are loose or broken. Make sure that both sides of any steps have handrails.  Any raised decks and porches should have guardrails on the edges.  Have any leaves, snow, or ice cleared regularly.  Use sand or salt on walking paths during winter.  Clean up any spills in your garage right away. This includes oil or grease spills. What can I do in the bathroom?  Use night lights.  Install grab bars by the toilet and in the tub and shower. Do not use towel bars as grab bars.  Use non-skid mats or decals in the tub or shower.  If you need to sit down in the shower, use a plastic,  non-slip stool.  Keep the floor dry. Clean up any water that spills on the floor as soon as it happens.  Remove soap buildup in the tub or shower regularly.  Attach bath mats securely with double-sided non-slip rug tape.  Do not have throw rugs and other things on the floor that can make you trip. What can I do in the bedroom?  Use night lights.  Make sure that you have a light by your bed that is easy to reach.  Do not use any sheets or blankets that are too big for your bed. They should not hang down onto the floor.  Have a firm chair that has side arms. You can use this for support while you get dressed.  Do not have throw rugs and other things on the floor that can make you trip. What can I do in the kitchen?  Clean up any spills right away.  Avoid walking on wet floors.  Keep items that you use a lot in easy-to-reach places.  If you need to reach something above you, use a strong step stool that has a grab bar.  Keep electrical cords out of the way.  Do not use floor polish or wax  that makes floors slippery. If you must use wax, use non-skid floor wax.  Do not have throw rugs and other things on the floor that can make you trip. What can I do with my stairs?  Do not leave any items on the stairs.  Make sure that there are handrails on both sides of the stairs and use them. Fix handrails that are broken or loose. Make sure that handrails are as long as the stairways.  Check any carpeting to make sure that it is firmly attached to the stairs. Fix any carpet that is loose or worn.  Avoid having throw rugs at the top or bottom of the stairs. If you do have throw rugs, attach them to the floor with carpet tape.  Make sure that you have a light switch at the top of the stairs and the bottom of the stairs. If you do not have them, ask someone to add them for you. What else can I do to help prevent falls?  Wear shoes that:  Do not have high heels.  Have rubber  bottoms.  Are comfortable and fit you well.  Are closed at the toe. Do not wear sandals.  If you use a stepladder:  Make sure that it is fully opened. Do not climb a closed stepladder.  Make sure that both sides of the stepladder are locked into place.  Ask someone to hold it for you, if possible.  Clearly mark and make sure that you can see:  Any grab bars or handrails.  First and last steps.  Where the edge of each step is.  Use tools that help you move around (mobility aids) if they are needed. These include:  Canes.  Walkers.  Scooters.  Crutches.  Turn on the lights when you go into a dark area. Replace any light bulbs as soon as they burn out.  Set up your furniture so you have a clear path. Avoid moving your furniture around.  If any of your floors are uneven, fix them.  If there are any pets around you, be aware of where they are.  Review your medicines with your doctor. Some medicines can make you feel dizzy. This can increase your chance of falling. Ask your doctor what other things that you can do to help prevent falls. This information is not intended to replace advice given to you by your health care provider. Make sure you discuss any questions you have with your health care provider. Document Released: 12/18/2008 Document Revised: 07/30/2015 Document Reviewed: 03/28/2014 Elsevier Interactive Patient Education  2017 [employer].

## 2017-10-11 NOTE — Progress Notes (Signed)
BP 113/67 (BP Location: Left Arm, Cuff Size: Normal)   Pulse 87   Temp 97.7 F (36.5 C) (Oral)   Ht 4\' 10"  (1.473 m)   Wt 126 lb 11.2 oz (57.5 kg)   LMP  (LMP Unknown)   SpO2 100%   BMI 26.48 kg/m    Subjective:    Patient ID: Kristi Clark, female    DOB: October 15, 1950, 67 y.o.   MRN: [id number]  HPI: Kristi Clark is a 67 y.o. female  Chief Complaint  Patient presents with  . Depression  . Hyperlipidemia  . Hypertension   Pt here today for BP, COPD, and cholesterol f/u. Taking medications compliantly without side effects. Does not check home BPs. Tries to stay active. Still smoking, not wanting to quit. Denies CP, SOB, claudication, myalgias.   COPD currently controlled decently with spiriva and albuterol. Has had 2-3 flares the past 6 months alongside a URI. Followed by Pulmonology.   Past Medical History:  Diagnosis Date  . Depression   . GERD (gastroesophageal reflux disease)   . Hyperlipidemia   . Hypertension   . Menopause   . Osteopenia   . Tobacco abuse    Social History   Socioeconomic History  . Marital status: Married    Spouse name: Not on file  . Number of children: Not on file  . Years of education: Not on file  . Highest education level: 10th grade  Occupational History  . Not on file  Social Needs  . Financial resource strain: Not hard at all  . Food insecurity:    Worry: Never true    Inability: Never true  . Transportation needs:    Medical: No    Non-medical: No  Tobacco Use  . Smoking status: Current Every Day Smoker    Packs/day: 1.00    Types: Cigarettes  . Smokeless tobacco: Never Used  Substance and Sexual Activity  . Alcohol use: No  . Drug use: No  . Sexual activity: Never  Lifestyle  . Physical activity:    Days per week: 0 days    Minutes per session: 0 min  . Stress: Not at all  Relationships  . Social connections:    Talks on phone: More than three times a week    Gets together: More than three times a  week    Attends religious service: Never    Active member of club or organization: No    Attends meetings of clubs or organizations: Never    Relationship status: Married  . Intimate partner violence:    Fear of current or ex partner: No    Emotionally abused: No    Physically abused: No    Forced sexual activity: No  Other Topics Concern  . Not on file  Social History Narrative  . Not on file    Relevant past medical, surgical, family and social history reviewed and updated as indicated. Interim medical history since our last visit reviewed. Allergies and medications reviewed and updated.  Review of Systems  Per HPI unless specifically indicated above     Objective:    BP 113/67 (BP Location: Left Arm, Cuff Size: Normal)   Pulse 87   Temp 97.7 F (36.5 C) (Oral)   Ht 4\' 10"  (1.473 m)   Wt 126 lb 11.2 oz (57.5 kg)   LMP  (LMP Unknown)   SpO2 100%   BMI 26.48 kg/m   Wt Readings from Last 3 Encounters:  10/11/17 126  lb 11.2 oz (57.5 kg)  10/11/17 126 lb 11.2 oz (57.5 kg)  09/21/17 126 lb (57.2 kg)    Physical Exam  Constitutional: She is oriented to person, place, and time. She appears well-developed and well-nourished. No distress.  HENT:  Head: Atraumatic.  Eyes: Conjunctivae and EOM are normal.  Neck: Normal range of motion. Neck supple.  Cardiovascular: Normal rate and regular rhythm.  Pulmonary/Chest: Effort normal. No respiratory distress. She has wheezes (mild diffuse ).  Musculoskeletal: Normal range of motion.  Neurological: She is alert and oriented to person, place, and time.  Skin: Skin is warm and dry.  Psychiatric: She has a normal mood and affect. Her behavior is normal.  Nursing note and vitals reviewed.   Results for orders placed or performed in visit on 10/11/17  Comprehensive metabolic panel  Result Value Ref Range   Glucose 119 (H) 65 - 99 mg/dL   BUN 16 8 - 27 mg/dL   Creatinine, Ser 0.98 0.57 - 1.00 mg/dL   GFR calc non Af Amer 59 (L)  >59 mL/min/1.73   GFR calc Af Amer 69 >59 mL/min/1.73   BUN/Creatinine Ratio 16 12 - 28   Sodium 137 134 - 144 mmol/L   Potassium 4.1 3.5 - 5.2 mmol/L   Chloride 93 (L) 96 - 106 mmol/L   CO2 27 20 - 29 mmol/L   Calcium 9.9 8.7 - 10.3 mg/dL   Total Protein 6.5 6.0 - 8.5 g/dL   Albumin 4.2 3.6 - 4.8 g/dL   Globulin, Total 2.3 1.5 - 4.5 g/dL   Albumin/Globulin Ratio 1.8 1.2 - 2.2   Bilirubin Total <0.2 0.0 - 1.2 mg/dL   Alkaline Phosphatase 69 39 - 117 IU/L   AST 24 0 - 40 IU/L   ALT 16 0 - 32 IU/L  Lipid Panel w/o Chol/HDL Ratio  Result Value Ref Range   Cholesterol, Total 178 100 - 199 mg/dL   Triglycerides 148 0 - 149 mg/dL   HDL 57 >39 mg/dL   VLDL Cholesterol Cal 30 5 - 40 mg/dL   LDL Calculated 91 0 - 99 mg/dL      Assessment & Plan:   Problem List Items Addressed This Visit      Cardiovascular and Mediastinum   Benign hypertension with chronic kidney disease - Primary    BPs stable and WNL, continue current regimen      Relevant Medications   metoprolol succinate (TOPROL-XL) 50 MG 24 hr tablet   losartan (COZAAR) 100 MG tablet   isosorbide dinitrate (ISORDIL) 30 MG tablet   hydrochlorothiazide (HYDRODIURIL) 25 MG tablet   furosemide (LASIX) 20 MG tablet   atorvastatin (LIPITOR) 40 MG tablet   Other Relevant Orders   Comprehensive metabolic panel (Completed)     Respiratory   COPD (chronic obstructive pulmonary disease) (HCC)    Followed by Pulmonology. Continue spiriva and albuterol. Strongly encouraged smoking cessation      Relevant Medications   tiotropium (SPIRIVA) 18 MCG inhalation capsule   Fluticasone-Salmeterol (ADVAIR) 250-50 MCG/DOSE AEPB   fluticasone (FLONASE) 50 MCG/ACT nasal spray   albuterol (PROVENTIL HFA;VENTOLIN HFA) 108 (90 Base) MCG/ACT inhaler     Other   Hyperlipidemia    Check lipids and adjust as needed. Continue current regimen. Smoking cessation, diet and exercise recommended      Relevant Medications   metoprolol succinate  (TOPROL-XL) 50 MG 24 hr tablet   losartan (COZAAR) 100 MG tablet   isosorbide dinitrate (ISORDIL) 30 MG tablet  hydrochlorothiazide (HYDRODIURIL) 25 MG tablet   furosemide (LASIX) 20 MG tablet   atorvastatin (LIPITOR) 40 MG tablet   Other Relevant Orders   Lipid Panel w/o Chol/HDL Ratio (Completed)   Tobacco abuse    Encouraged smoking cessation, pt does not wish to quit          Follow up plan: Return in about 6 months (around 04/13/2018) for BP, Chol.

## 2017-10-11 NOTE — Progress Notes (Signed)
Subjective:   Kristi Clark is a 67 y.o. female who presents for Medicare Annual (Subsequent) preventive examination.  Review of Systems:   Cardiac Risk Factors include: advanced age (>37men, >85 women);dyslipidemia;smoking/ tobacco exposure;hypertension     Objective:     Vitals: BP (!) 132/52 (BP Location: Left Arm, Patient Position: Sitting)   Pulse 87   Temp 97.7 F (36.5 C) (Temporal)   Resp 16   Ht 4\' 10"  (1.473 m)   Wt 126 lb 11.2 oz (57.5 kg)   LMP  (LMP Unknown)   BMI 26.48 kg/m   Body mass index is 26.48 kg/m.  Advanced Directives 10/11/2017 10/03/2016 09/15/2015 07/14/2014  Does Patient Have a Medical Advance Directive? No Yes No No  Type of Advance Directive - [place] - -  Does patient want to make changes to medical advance directive? Yes (MAU/Ambulatory/Procedural Areas - Information given) - - -  Would patient like information on creating a medical advance directive? - - - No - patient declined information    Tobacco Social History   Tobacco Use  Smoking Status Current Every Day Smoker  . Packs/day: 1.00  . Types: Cigarettes  Smokeless Tobacco Never Used     Ready to quit: Yes Counseling given: Yes   Clinical Intake:  Pre-visit preparation completed: Yes  Pain : No/denies pain     Nutritional Status: BMI 25 -29 Overweight Nutritional Risks: None Diabetes: No  How often do you need to have someone help you when you read instructions, pamphlets, or other written materials from your doctor or pharmacy?: 1 - Never What is the last grade level you completed in school?: 10th grade  Interpreter Needed?: No  Information entered by :: Kandace Elrod,LPN  Past Medical History:  Diagnosis Date  . Depression   . GERD (gastroesophageal reflux disease)   . Hyperlipidemia   . Hypertension   . Menopause   . Osteopenia   . Tobacco abuse    Past Surgical History:  Procedure Laterality Date  . ABDOMINAL HYSTERECTOMY    .  CHOLECYSTECTOMY  2014   Family History  Problem Relation Age of Onset  . Cancer Mother   . Stroke Mother   . Heart disease Father   . Hyperlipidemia Father   . Cancer Sister   . Diabetes Sister   . Breast cancer Sister 8  . Diabetes Brother   . Asthma Son   . Cancer Son   . Diabetes Daughter   . Hypertension Daughter   . Cancer Maternal Grandmother        gallbladder  . Diabetes Brother   . Heart disease Brother   . Breast cancer Paternal Aunt    Social History   Socioeconomic History  . Marital status: Married    Spouse name: Not on file  . Number of children: Not on file  . Years of education: Not on file  . Highest education level: 10th grade  Occupational History  . Not on file  Social Needs  . Financial resource strain: Not hard at all  . Food insecurity:    Worry: Never true    Inability: Never true  . Transportation needs:    Medical: No    Non-medical: No  Tobacco Use  . Smoking status: Current Every Day Smoker    Packs/day: 1.00    Types: Cigarettes  . Smokeless tobacco: Never Used  Substance and Sexual Activity  . Alcohol use: No  . Drug use: No  .  Sexual activity: Never  Lifestyle  . Physical activity:    Days per week: 0 days    Minutes per session: 0 min  . Stress: Not at all  Relationships  . Social connections:    Talks on phone: More than three times a week    Gets together: More than three times a week    Attends religious service: Never    Active member of club or organization: No    Attends meetings of clubs or organizations: Never    Relationship status: Married  Other Topics Concern  . Not on file  Social History Narrative  . Not on file    Outpatient Encounter Medications as of 10/11/2017  Medication Sig  . albuterol (PROVENTIL HFA;VENTOLIN HFA) 108 (90 Base) MCG/ACT inhaler Inhale 2 puffs into the lungs every 6 (six) hours as needed for wheezing or shortness of breath.  Marland Kitchen aspirin 81 MG tablet Take 81 mg by mouth daily.  Marland Kitchen  atorvastatin (LIPITOR) 40 MG tablet Take 1 tablet (40 mg total) by mouth daily.  . cetirizine (ZYRTEC) 10 MG tablet Take 1 tablet (10 mg total) by mouth daily.  . clopidogrel (PLAVIX) 75 MG tablet Take 1 tablet (75 mg total) by mouth daily.  . fluticasone (FLONASE) 50 MCG/ACT nasal spray Place 2 sprays into both nostrils daily.  . Fluticasone-Salmeterol (ADVAIR) 250-50 MCG/DOSE AEPB Inhale 1 puff into the lungs 2 (two) times daily.  . hydrochlorothiazide (HYDRODIURIL) 25 MG tablet Take 1 tablet (25 mg total) by mouth daily.  . isosorbide dinitrate (ISORDIL) 30 MG tablet Take 1 tablet by mouth daily.  Marland Kitchen losartan (COZAAR) 100 MG tablet Take 1 tablet (100 mg total) by mouth daily.  . metoprolol succinate (TOPROL-XL) 50 MG 24 hr tablet Take 1 tablet (50 mg total) by mouth daily. Take with or immediately following a meal.  . Multiple Vitamins-Minerals (CENTRUM SILVER ADULT 50+ PO) Take 1 tablet by mouth daily.  . pantoprazole (PROTONIX) 20 MG tablet Take 1 tablet (20 mg total) by mouth daily.  . Probiotic Product (PROBIOTIC-10 PO) Take by mouth.  . tiotropium (SPIRIVA) 18 MCG inhalation capsule Place 1 capsule (18 mcg total) into inhaler and inhale daily.  Marland Kitchen azithromycin (ZITHROMAX) 250 MG tablet Take 2 tabs day one, then 1 tab daily until complete (Patient not taking: Reported on 10/11/2017)  . furosemide (LASIX) 20 MG tablet Take 1 tablet (20 mg total) by mouth daily. (Patient not taking: Reported on 10/11/2017)  . nystatin (MYCOSTATIN) 100000 UNIT/ML suspension Take 5 mLs (500,000 Units total) by mouth 4 (four) times daily. (Patient not taking: Reported on 10/11/2017)   No facility-administered encounter medications on file as of 10/11/2017.     Activities of Daily Living In your present state of health, do you have any difficulty performing the following activities: 10/11/2017  Hearing? N  Vision? N  Difficulty concentrating or making decisions? N  Walking or climbing stairs? N  Dressing or bathing? N   Doing errands, shopping? N  Preparing Food and eating ? N  Using the Toilet? N  In the past six months, have you accidently leaked urine? N  Do you have problems with loss of bowel control? N  Managing your Medications? N  Managing your Finances? N  Housekeeping or managing your Housekeeping? N  Some recent data might be hidden    Patient Care Team: Kathrine Haddock, NP as PCP - General (Nurse Practitioner) Allyne Gee, MD as Consulting Physician (Cardiology) Erby Pian, MD as Referring  Physician (Pulmonary Disease) Alfonso Patten, MD as Consulting Physician (Dermatology)    Assessment:   This is a routine wellness examination for [place].  Exercise Activities and Dietary recommendations Current Exercise Habits: The patient does not participate in regular exercise at present, Exercise limited by: None identified  Goals    . Quit Smoking     Smoking cessation discussed       Fall Risk Fall Risk  10/11/2017 10/03/2016 09/15/2015 03/06/2015  Falls in the past year? No No No No   Is the patient's home free of loose throw rugs in walkways, pet beds, electrical cords, etc?   yes      Grab bars in the bathroom? yes      Handrails on the stairs?   yes, ramp on front porch       Adequate lighting?   yes  Timed Get Up and Go performed: Completed in 8 seconds with no use of assistive devices, steady gait. No intervention needed at this time.   Depression Screen PHQ 2/9 Scores 10/11/2017 09/21/2017 04/04/2017 10/03/2016  PHQ - 2 Score 1 1 2  0  PHQ- 9 Score 4 6 4 2      Cognitive Function     6CIT Screen 10/11/2017  What Year? 0 points  What month? 0 points  What time? 0 points  Count back from 20 0 points  Months in reverse 0 points  Repeat phrase 0 points  Total Score 0    Immunization History  Administered Date(s) Administered  . Influenza, High Dose Seasonal PF 01/04/2016, 12/23/2016  . Influenza,inj,Quad PF,6+ Mos 02/11/2015  . Influenza-Unspecified 12/13/2013    . Pneumococcal Conjugate-13 08/14/2013  . Pneumococcal Polysaccharide-23 03/04/2014  . Td 12/03/1994  . Zoster 02/07/2011    Qualifies for Shingles Vaccine? Yes, discussed shingrix vaccine   Screening Tests Health Maintenance  Topic Date Due  . INFLUENZA VACCINE  10/05/2017  . COLONOSCOPY  12/07/2017  . MAMMOGRAM  10/22/2018  . TETANUS/TDAP  06/21/2020  . DEXA SCAN  Completed  . Hepatitis C Screening  Completed  . PNA vac Low Risk Adult  Completed    Cancer Screenings: Lung: Low Dose CT Chest recommended if Age 38-80 years, 30 pack-year currently smoking OR have quit w/in 15years. Patient does qualify. Breast:  Up to date on Mammogram? Yes  10/21/2016, scheduled for 10/23/2017 Up to date of Bone Density/Dexa? Yes 08/22/2012 Colorectal: completed 12/07/2012, scheduled appt with GI   Additional Screenings:  Hepatitis C Screening: completed 09/15/2015     Plan:    I have personally reviewed and addressed the Medicare Annual Wellness questionnaire and have noted the following in the patient's chart:  A. Medical and social history B. Use of alcohol, tobacco or illicit drugs  C. Current medications and supplements D. Functional ability and status E.  Nutritional status F.  Physical activity G. Advance directives H. List of other physicians I.  Hospitalizations, surgeries, and ER visits in previous 12 months J.  [place] such as hearing and vision if needed, cognitive and depression L. Referrals and appointments   In addition, I have reviewed and discussed with patient certain preventive protocols, quality metrics, and best practice recommendations. A written personalized care plan for preventive services as well as general preventive health recommendations were provided to patient.   Signed,  Tyler Aas, LPN Nurse Health Advisor   Nurse Notes:none

## 2017-10-12 ENCOUNTER — Encounter: Payer: Self-pay | Admitting: Family Medicine

## 2017-10-12 LAB — COMPREHENSIVE METABOLIC PANEL
A/G RATIO: 1.8 (ref 1.2–2.2)
ALT: 16 IU/L (ref 0–32)
AST: 24 IU/L (ref 0–40)
Albumin: 4.2 g/dL (ref 3.6–4.8)
Alkaline Phosphatase: 69 IU/L (ref 39–117)
BUN/Creatinine Ratio: 16 (ref 12–28)
BUN: 16 mg/dL (ref 8–27)
Bilirubin Total: <0.2 mg/dL (ref 0.0–1.2)
CALCIUM: 9.9 mg/dL (ref 8.7–10.3)
CO2: 27 mmol/L (ref 20–29)
Chloride: 93 mmol/L — ABNORMAL LOW (ref 96–106)
Creatinine, Ser: 0.98 mg/dL (ref 0.57–1.00)
GFR calc Af Amer: 69 mL/min/{1.73_m2} (ref >59)
GFR, EST NON AFRICAN AMERICAN: 59 mL/min/{1.73_m2} — AB (ref >59)
Globulin, Total: 2.3 g/dL (ref 1.5–4.5)
Glucose: 119 mg/dL — ABNORMAL HIGH (ref 65–99)
POTASSIUM: 4.1 mmol/L (ref 3.5–5.2)
Sodium: 137 mmol/L (ref 134–144)
Total Protein: 6.5 g/dL (ref 6.0–8.5)

## 2017-10-12 LAB — LIPID PANEL W/O CHOL/HDL RATIO
CHOLESTEROL TOTAL: 178 mg/dL (ref 100–199)
HDL: 57 mg/dL (ref >39)
LDL Calculated: 91 mg/dL (ref 0–99)
TRIGLYCERIDES: 148 mg/dL (ref 0–149)
VLDL Cholesterol Cal: 30 mg/dL (ref 5–40)

## 2017-10-17 DIAGNOSIS — F17219 Nicotine dependence, cigarettes, with unspecified nicotine-induced disorders: Secondary | ICD-10-CM | POA: Insufficient documentation

## 2017-10-17 DIAGNOSIS — Z720 Tobacco use: Secondary | ICD-10-CM | POA: Insufficient documentation

## 2017-10-17 NOTE — Assessment & Plan Note (Signed)
Encouraged smoking cessation, pt does not wish to quit

## 2017-10-17 NOTE — Assessment & Plan Note (Signed)
Followed by Pulmonology. Continue spiriva and albuterol. Strongly encouraged smoking cessation

## 2017-10-17 NOTE — Patient Instructions (Signed)
Follow up in 6 months 

## 2017-10-17 NOTE — Assessment & Plan Note (Signed)
Check lipids and adjust as needed. Continue current regimen. Smoking cessation, diet and exercise recommended

## 2017-10-17 NOTE — Assessment & Plan Note (Signed)
BPs stable and WNL, continue current regimen 

## 2017-10-23 ENCOUNTER — Ambulatory Visit
Admission: RE | Admit: 2017-10-23 | Discharge: 2017-10-23 | Disposition: A | Discharge status: Home / Self Care | Payer: Medicare Other | Source: Ambulatory Visit | Attending: Unknown Physician Specialty | Admitting: Unknown Physician Specialty

## 2017-10-23 DIAGNOSIS — Z1231 Encounter for screening mammogram for malignant neoplasm of breast: Secondary | ICD-10-CM | POA: Diagnosis not present

## 2017-10-25 DIAGNOSIS — C44111 Basal cell carcinoma of skin of unspecified eyelid, including canthus: Secondary | ICD-10-CM | POA: Diagnosis not present

## 2017-10-25 DIAGNOSIS — D485 Neoplasm of uncertain behavior of skin: Secondary | ICD-10-CM | POA: Diagnosis not present

## 2017-10-25 DIAGNOSIS — C4491 Basal cell carcinoma of skin, unspecified: Secondary | ICD-10-CM

## 2017-10-25 DIAGNOSIS — L570 Actinic keratosis: Secondary | ICD-10-CM

## 2017-10-25 DIAGNOSIS — L720 Epidermal cyst: Secondary | ICD-10-CM | POA: Diagnosis not present

## 2017-10-25 HISTORY — DX: Actinic keratosis: L57.0

## 2017-10-25 HISTORY — DX: Basal cell carcinoma of skin, unspecified: C44.91

## 2017-11-01 DIAGNOSIS — R21 Rash and other nonspecific skin eruption: Secondary | ICD-10-CM | POA: Diagnosis not present

## 2017-11-01 DIAGNOSIS — L570 Actinic keratosis: Secondary | ICD-10-CM | POA: Diagnosis not present

## 2017-11-01 DIAGNOSIS — L720 Epidermal cyst: Secondary | ICD-10-CM | POA: Diagnosis not present

## 2017-11-02 DIAGNOSIS — J449 Chronic obstructive pulmonary disease, unspecified: Secondary | ICD-10-CM | POA: Diagnosis not present

## 2017-11-02 DIAGNOSIS — J849 Interstitial pulmonary disease, unspecified: Secondary | ICD-10-CM | POA: Diagnosis not present

## 2017-11-16 DIAGNOSIS — E782 Mixed hyperlipidemia: Secondary | ICD-10-CM | POA: Diagnosis not present

## 2017-11-16 DIAGNOSIS — F17200 Nicotine dependence, unspecified, uncomplicated: Secondary | ICD-10-CM | POA: Diagnosis not present

## 2017-11-16 DIAGNOSIS — J449 Chronic obstructive pulmonary disease, unspecified: Secondary | ICD-10-CM | POA: Diagnosis not present

## 2017-11-16 DIAGNOSIS — I10 Essential (primary) hypertension: Secondary | ICD-10-CM | POA: Diagnosis not present

## 2017-11-16 DIAGNOSIS — Z9861 Coronary angioplasty status: Secondary | ICD-10-CM | POA: Diagnosis not present

## 2017-11-16 DIAGNOSIS — R079 Chest pain, unspecified: Secondary | ICD-10-CM | POA: Diagnosis not present

## 2017-11-16 DIAGNOSIS — R0602 Shortness of breath: Secondary | ICD-10-CM | POA: Diagnosis not present

## 2017-11-16 DIAGNOSIS — I2510 Atherosclerotic heart disease of native coronary artery without angina pectoris: Secondary | ICD-10-CM | POA: Diagnosis not present

## 2017-11-21 DIAGNOSIS — Z8371 Family history of colonic polyps: Secondary | ICD-10-CM | POA: Diagnosis not present

## 2017-11-21 DIAGNOSIS — R1013 Epigastric pain: Secondary | ICD-10-CM | POA: Diagnosis not present

## 2017-11-22 DIAGNOSIS — I10 Essential (primary) hypertension: Secondary | ICD-10-CM | POA: Diagnosis not present

## 2017-11-22 DIAGNOSIS — E782 Mixed hyperlipidemia: Secondary | ICD-10-CM | POA: Diagnosis not present

## 2017-11-22 DIAGNOSIS — I2510 Atherosclerotic heart disease of native coronary artery without angina pectoris: Secondary | ICD-10-CM | POA: Diagnosis not present

## 2017-11-22 DIAGNOSIS — R079 Chest pain, unspecified: Secondary | ICD-10-CM | POA: Diagnosis not present

## 2017-11-22 DIAGNOSIS — F17200 Nicotine dependence, unspecified, uncomplicated: Secondary | ICD-10-CM | POA: Diagnosis not present

## 2017-11-23 DIAGNOSIS — I2510 Atherosclerotic heart disease of native coronary artery without angina pectoris: Secondary | ICD-10-CM | POA: Diagnosis not present

## 2017-11-23 DIAGNOSIS — R079 Chest pain, unspecified: Secondary | ICD-10-CM | POA: Diagnosis not present

## 2017-11-23 DIAGNOSIS — F17200 Nicotine dependence, unspecified, uncomplicated: Secondary | ICD-10-CM | POA: Diagnosis not present

## 2017-11-23 DIAGNOSIS — I10 Essential (primary) hypertension: Secondary | ICD-10-CM | POA: Diagnosis not present

## 2017-11-23 DIAGNOSIS — E782 Mixed hyperlipidemia: Secondary | ICD-10-CM | POA: Diagnosis not present

## 2017-12-22 DIAGNOSIS — C441191 Basal cell carcinoma of skin of left upper eyelid, including canthus: Secondary | ICD-10-CM | POA: Diagnosis not present

## 2017-12-22 DIAGNOSIS — L249 Irritant contact dermatitis, unspecified cause: Secondary | ICD-10-CM | POA: Diagnosis not present

## 2017-12-25 ENCOUNTER — Ambulatory Visit (INDEPENDENT_AMBULATORY_CARE_PROVIDER_SITE_OTHER): Payer: Medicare Other

## 2017-12-25 DIAGNOSIS — Z23 Encounter for immunization: Secondary | ICD-10-CM | POA: Diagnosis not present

## 2018-01-10 ENCOUNTER — Encounter: Payer: Self-pay | Admitting: Family Medicine

## 2018-01-10 ENCOUNTER — Ambulatory Visit (INDEPENDENT_AMBULATORY_CARE_PROVIDER_SITE_OTHER): Payer: Medicare Other | Admitting: Family Medicine

## 2018-01-10 VITALS — BP 121/60 | HR 85 | Temp 98.0°F | Ht 63.0 in | Wt 127.0 lb

## 2018-01-10 DIAGNOSIS — J069 Acute upper respiratory infection, unspecified: Secondary | ICD-10-CM | POA: Diagnosis not present

## 2018-01-10 MED ORDER — AZITHROMYCIN 250 MG PO TABS: ORAL_TABLET | ORAL | 0 refills | Status: DC

## 2018-01-10 NOTE — Progress Notes (Signed)
BP 121/60   Pulse 85   Temp 98 F (36.7 C) (Oral)   Ht 5\' 3"  (1.6 m)   Wt 127 lb (57.6 kg)   LMP  (LMP Unknown)   SpO2 95%   BMI 22.50 kg/m    Subjective:    Patient ID: ALAZE Clark, female    DOB: October 16, 1950, 67 y.o.   MRN: [id number]  HPI: Kristi Clark is a 67 y.o. female  Chief Complaint  Patient presents with  . Sinus Problem    Started last week  . Cough    Dry.   . Sore Throat    Throat feels clogged.    Over a week of general fatigue, sinus pain and pressure, dry hacking cough, throat swelling and drainage. Using warm compresses, tylenol with no relief. Has a colonoscopy scheduled for next week and is concerned about being sick for it. Breathing seems to be doing fairly well through all of this, using normal inhaler regimen faithfully. Current smoker, hx of COPD and allergic rhinitis.   Relevant past medical, surgical, family and social history reviewed and updated as indicated. Interim medical history since our last visit reviewed. Allergies and medications reviewed and updated.  Review of Systems  Per HPI unless specifically indicated above     Objective:    BP 121/60   Pulse 85   Temp 98 F (36.7 C) (Oral)   Ht 5\' 3"  (1.6 m)   Wt 127 lb (57.6 kg)   LMP  (LMP Unknown)   SpO2 95%   BMI 22.50 kg/m   Wt Readings from Last 3 Encounters:  01/10/18 127 lb (57.6 kg)  10/11/17 126 lb 11.2 oz (57.5 kg)  10/11/17 126 lb 11.2 oz (57.5 kg)    Physical Exam  Constitutional: She is oriented to person, place, and time. She appears well-developed and well-nourished. No distress.  HENT:  Head: Atraumatic.  Right Ear: External ear normal.  Left Ear: External ear normal.  Oropharynx and nasal mucosa erythematous with thick drainage present  Eyes: Conjunctivae and EOM are normal.  Neck: Normal range of motion. Neck supple.  Cardiovascular: Normal rate and regular rhythm.  Pulmonary/Chest: Effort normal. She has wheezes (mild diffuse).  Breath  sounds decreased diffusely  Musculoskeletal: Normal range of motion.  Neurological: She is alert and oriented to person, place, and time.  Skin: Skin is warm and dry.  Psychiatric: She has a normal mood and affect. Her behavior is normal.  Nursing note and vitals reviewed.   Results for orders placed or performed in visit on 10/11/17  Comprehensive metabolic panel  Result Value Ref Range   Glucose 119 (H) 65 - 99 mg/dL   BUN 16 8 - 27 mg/dL   Creatinine, Ser 0.98 0.57 - 1.00 mg/dL   GFR calc non Af Amer 59 (L) >59 mL/min/1.73   GFR calc Af Amer 69 >59 mL/min/1.73   BUN/Creatinine Ratio 16 12 - 28   Sodium 137 134 - 144 mmol/L   Potassium 4.1 3.5 - 5.2 mmol/L   Chloride 93 (L) 96 - 106 mmol/L   CO2 27 20 - 29 mmol/L   Calcium 9.9 8.7 - 10.3 mg/dL   Total Protein 6.5 6.0 - 8.5 g/dL   Albumin 4.2 3.6 - 4.8 g/dL   Globulin, Total 2.3 1.5 - 4.5 g/dL   Albumin/Globulin Ratio 1.8 1.2 - 2.2   Bilirubin Total <0.2 0.0 - 1.2 mg/dL   Alkaline Phosphatase 69 39 - 117 IU/L  AST 24 0 - 40 IU/L   ALT 16 0 - 32 IU/L  Lipid Panel w/o Chol/HDL Ratio  Result Value Ref Range   Cholesterol, Total 178 100 - 199 mg/dL   Triglycerides 148 0 - 149 mg/dL   HDL 57 >39 mg/dL   VLDL Cholesterol Cal 30 5 - 40 mg/dL   LDL Calculated 91 0 - 99 mg/dL      Assessment & Plan:   Problem List Items Addressed This Visit    None    Visit Diagnoses    Upper respiratory tract infection, unspecified type    -  Primary   Tx with zpack,delsym, and typical inhaler and allergy regimen. Supportive care reviewed. F/u if not improving   Relevant Medications   azithromycin (ZITHROMAX) 250 MG tablet       Follow up plan: Return if symptoms worsen or fail to improve.

## 2018-01-15 ENCOUNTER — Encounter: Payer: Self-pay | Admitting: *Deleted

## 2018-01-16 ENCOUNTER — Encounter
Admission: RE | Disposition: A | Discharge status: Home / Self Care | Payer: Self-pay | Source: Ambulatory Visit | Attending: Gastroenterology

## 2018-01-16 ENCOUNTER — Encounter: Payer: Self-pay | Admitting: Family Medicine

## 2018-01-16 ENCOUNTER — Encounter: Payer: Self-pay | Admitting: *Deleted

## 2018-01-16 ENCOUNTER — Ambulatory Visit: Payer: Medicare Other | Admitting: Anesthesiology

## 2018-01-16 ENCOUNTER — Ambulatory Visit
Admission: RE | Admit: 2018-01-16 | Discharge: 2018-01-16 | Disposition: A | Discharge status: Home / Self Care | Payer: Medicare Other | Source: Ambulatory Visit | Attending: Gastroenterology | Admitting: Gastroenterology

## 2018-01-16 DIAGNOSIS — J449 Chronic obstructive pulmonary disease, unspecified: Secondary | ICD-10-CM | POA: Insufficient documentation

## 2018-01-16 DIAGNOSIS — Z8371 Family history of colonic polyps: Secondary | ICD-10-CM | POA: Insufficient documentation

## 2018-01-16 DIAGNOSIS — K2960 Other gastritis without bleeding: Secondary | ICD-10-CM | POA: Insufficient documentation

## 2018-01-16 DIAGNOSIS — K2210 Ulcer of esophagus without bleeding: Secondary | ICD-10-CM | POA: Insufficient documentation

## 2018-01-16 DIAGNOSIS — K5791 Diverticulosis of intestine, part unspecified, without perforation or abscess with bleeding: Secondary | ICD-10-CM | POA: Diagnosis not present

## 2018-01-16 DIAGNOSIS — Z86010 Personal history of colonic polyps: Secondary | ICD-10-CM | POA: Diagnosis not present

## 2018-01-16 DIAGNOSIS — Z1211 Encounter for screening for malignant neoplasm of colon: Secondary | ICD-10-CM | POA: Diagnosis not present

## 2018-01-16 DIAGNOSIS — K208 Other esophagitis: Secondary | ICD-10-CM | POA: Diagnosis not present

## 2018-01-16 DIAGNOSIS — K3189 Other diseases of stomach and duodenum: Secondary | ICD-10-CM | POA: Insufficient documentation

## 2018-01-16 DIAGNOSIS — Z7902 Long term (current) use of antithrombotics/antiplatelets: Secondary | ICD-10-CM | POA: Diagnosis not present

## 2018-01-16 DIAGNOSIS — Z79899 Other long term (current) drug therapy: Secondary | ICD-10-CM | POA: Diagnosis not present

## 2018-01-16 DIAGNOSIS — Z7982 Long term (current) use of aspirin: Secondary | ICD-10-CM | POA: Diagnosis not present

## 2018-01-16 DIAGNOSIS — K5730 Diverticulosis of large intestine without perforation or abscess without bleeding: Secondary | ICD-10-CM | POA: Diagnosis not present

## 2018-01-16 DIAGNOSIS — I129 Hypertensive chronic kidney disease with stage 1 through stage 4 chronic kidney disease, or unspecified chronic kidney disease: Secondary | ICD-10-CM | POA: Diagnosis not present

## 2018-01-16 DIAGNOSIS — I10 Essential (primary) hypertension: Secondary | ICD-10-CM | POA: Diagnosis not present

## 2018-01-16 DIAGNOSIS — M8580 Other specified disorders of bone density and structure, unspecified site: Secondary | ICD-10-CM | POA: Diagnosis not present

## 2018-01-16 DIAGNOSIS — F329 Major depressive disorder, single episode, unspecified: Secondary | ICD-10-CM | POA: Insufficient documentation

## 2018-01-16 DIAGNOSIS — K2900 Acute gastritis without bleeding: Secondary | ICD-10-CM | POA: Diagnosis not present

## 2018-01-16 DIAGNOSIS — Z882 Allergy status to sulfonamides status: Secondary | ICD-10-CM | POA: Insufficient documentation

## 2018-01-16 DIAGNOSIS — K449 Diaphragmatic hernia without obstruction or gangrene: Secondary | ICD-10-CM | POA: Insufficient documentation

## 2018-01-16 DIAGNOSIS — K219 Gastro-esophageal reflux disease without esophagitis: Secondary | ICD-10-CM | POA: Insufficient documentation

## 2018-01-16 DIAGNOSIS — E785 Hyperlipidemia, unspecified: Secondary | ICD-10-CM | POA: Diagnosis not present

## 2018-01-16 DIAGNOSIS — K259 Gastric ulcer, unspecified as acute or chronic, without hemorrhage or perforation: Secondary | ICD-10-CM | POA: Diagnosis not present

## 2018-01-16 DIAGNOSIS — Z885 Allergy status to narcotic agent status: Secondary | ICD-10-CM | POA: Insufficient documentation

## 2018-01-16 DIAGNOSIS — K5790 Diverticulosis of intestine, part unspecified, without perforation or abscess without bleeding: Secondary | ICD-10-CM | POA: Diagnosis not present

## 2018-01-16 DIAGNOSIS — K209 Esophagitis, unspecified: Secondary | ICD-10-CM | POA: Diagnosis not present

## 2018-01-16 DIAGNOSIS — N182 Chronic kidney disease, stage 2 (mild): Secondary | ICD-10-CM | POA: Diagnosis not present

## 2018-01-16 HISTORY — DX: Chronic obstructive pulmonary disease, unspecified: J44.9

## 2018-01-16 HISTORY — PX: ESOPHAGOGASTRODUODENOSCOPY (EGD) WITH PROPOFOL: SHX5813

## 2018-01-16 HISTORY — PX: COLONOSCOPY WITH PROPOFOL: SHX5780

## 2018-01-16 LAB — HM COLONOSCOPY

## 2018-01-16 SURGERY — ESOPHAGOGASTRODUODENOSCOPY (EGD) WITH PROPOFOL
Anesthesia: General

## 2018-01-16 MED ORDER — LIDOCAINE HCL (PF) 2 % IJ SOLN
INTRAMUSCULAR | Status: AC
  Filled 2018-01-16: qty 10

## 2018-01-16 MED ORDER — FENTANYL CITRATE (PF) 100 MCG/2ML IJ SOLN
INTRAMUSCULAR | Status: AC
  Filled 2018-01-16: qty 2

## 2018-01-16 MED ORDER — FENTANYL CITRATE (PF) 100 MCG/2ML IJ SOLN
INTRAMUSCULAR | Status: DC | PRN
  Administered 2018-01-16: 50 ug via INTRAVENOUS

## 2018-01-16 MED ORDER — SODIUM CHLORIDE 0.9 % IV SOLN
INTRAVENOUS | Status: DC
  Administered 2018-01-16: 1000 mL via INTRAVENOUS

## 2018-01-16 MED ORDER — PROPOFOL 500 MG/50ML IV EMUL
INTRAVENOUS | Status: DC | PRN
  Administered 2018-01-16: 140 ug/kg/min via INTRAVENOUS

## 2018-01-16 MED ORDER — PROPOFOL 10 MG/ML IV BOLUS
INTRAVENOUS | Status: DC | PRN
  Administered 2018-01-16 (×2): 28.8 mg via INTRAVENOUS

## 2018-01-16 MED ORDER — PROPOFOL 500 MG/50ML IV EMUL
INTRAVENOUS | Status: AC
  Filled 2018-01-16: qty 50

## 2018-01-16 NOTE — Op Note (Signed)
[facility] Patient Name: Kristi Clark Procedure Date: 01/16/2018 9:46 AM MRN: [id number] Account #: [id number] Date of Birth: [birth year] Admit Type: Outpatient Age: 67 Room: [facility] ENDO ROOM 3 Gender: Female Note Status: Finalized Procedure:            Colonoscopy Indications:          Family history of colonic polyps in a first-degree                        relative Providers:            Lollie Sails, MD Referring MD:         Volney American (Referring MD) Medicines:            Monitored Anesthesia Care Complications:        No immediate complications. Procedure:            Pre-Anesthesia Assessment:                       - ASA Grade Assessment: III - A patient with severe                        systemic disease.                       After obtaining informed consent, the colonoscope was                        passed under direct vision. Throughout the procedure,                        the patient's blood pressure, pulse, and oxygen                        saturations were monitored continuously. The                        Colonoscope was introduced through the anus and                        advanced to the the cecum, identified by appendiceal                        orifice and ileocecal valve. The colonoscopy was                        performed with moderate difficulty due to a tortuous                        colon. Successful completion of the procedure was aided                        by changing the patient to a supine position and using                        manual pressure. The patient tolerated the procedure                        well. The quality of the bowel preparation was good. Findings:      A few small and large-mouthed diverticula were found  in the sigmoid       colon.      The sigmoid colon and descending colon were moderately tortuous.      The digital rectal exam was normal. Impression:           -  Diverticulosis in the sigmoid colon.                       - Tortuous colon.                       - No specimens collected. Recommendation:       - Discharge patient to home.                       - Repeat colonoscopy in 5 years for screening purposes. Procedure Code(s):    --- Professional ---                       [id number], Colonoscopy, flexible; diagnostic, including                        collection of specimen(s) by brushing or washing, when                        performed (separate procedure) Diagnosis Code(s):    --- Professional ---                       Z83.71, Family history of colonic polyps                       K57.30, Diverticulosis of large intestine without                        perforation or abscess without bleeding                       Q43.8, Other specified congenital malformations of                        intestine CPT copyright 2018 [employer]. All rights reserved. The codes documented in this report are preliminary and upon coder review may  be revised to meet current compliance requirements. Lollie Sails, MD 01/16/2018 10:48:33 AM This report has been signed electronically. Number of Addenda: 0 Note Initiated On: 01/16/2018 9:46 AM Scope Withdrawal Time: 0 hours 13 minutes 28 seconds  Total Procedure Duration: 0 hours 23 minutes 21 seconds       [facility]

## 2018-01-16 NOTE — Anesthesia Postprocedure Evaluation (Signed)
Anesthesia Post Note  Patient: Kristi Clark  Procedure(s) Performed: ESOPHAGOGASTRODUODENOSCOPY (EGD) WITH PROPOFOL (N/A ) COLONOSCOPY WITH PROPOFOL (N/A )  Patient location during evaluation: Endoscopy Anesthesia Type: General Level of consciousness: awake and alert Pain management: pain level controlled Vital Signs Assessment: post-procedure vital signs reviewed and stable Respiratory status: spontaneous breathing and respiratory function stable Cardiovascular status: stable Anesthetic complications: no     Last Vitals:  Vitals:   01/16/18 0922 01/16/18 1049  BP: (!) 133/57 (!) 106/50  Pulse: 80 68  Resp: 20 20  Temp: (!) 36.3 C (!) 36.2 C  SpO2: 99% 100%    Last Pain:  Vitals:   01/16/18 1049  TempSrc: Tympanic  PainSc: Asleep                 Atif Chapple K

## 2018-01-16 NOTE — Anesthesia Post-op Follow-up Note (Signed)
Anesthesia QCDR form completed.        

## 2018-01-16 NOTE — Op Note (Signed)
[facility] Patient Name: Kristi Clark Procedure Date: 01/16/2018 9:49 AM MRN: [id number] Account #: [id number] Date of Birth: 30-Jul-1950 Admit Type: Outpatient Age: 67 Room: [facility] ENDO ROOM 3 Gender: Female Note Status: Finalized Procedure:            Upper GI endoscopy Indications:          Dyspepsia Providers:            Lollie Sails, MD Referring MD:         Volney American (Referring MD) Medicines:            Monitored Anesthesia Care Complications:        No immediate complications. Procedure:            Pre-Anesthesia Assessment:                       - ASA Grade Assessment: III - A patient with severe                        systemic disease.                       After obtaining informed consent, the endoscope was                        passed under direct vision. Throughout the procedure,                        the patient's blood pressure, pulse, and oxygen                        saturations were monitored continuously. The Endoscope                        was introduced through the mouth, and advanced to the                        third part of duodenum. The upper GI endoscopy was                        accomplished without difficulty. The patient tolerated                        the procedure well. Findings:      The Z-line was regular. Biopsies were taken with a cold forceps for       histology.      The exam of the esophagus was otherwise normal.      Diffuse and scattered mild inflammation characterized by adherent blood,       congestion (edema) and erythema was found in the gastric body, at the       incisura and in the gastric antrum. Biopsies were taken with a cold       forceps for histology. Biopsies were taken with a cold forceps for       Helicobacter pylori testing.      The cardia and gastric fundus were normal on retroflexion otherwise.      Diffuse mild mucosal variance characterized by altered texture  was found       in the entire duodenum. Biopsies were taken with a cold forceps for       histology.  A small hiatal hernia was present. Impression:           - Z-line regular. Biopsied.                       - Bile gastritis. Biopsied.                       - Mucosal variant in the duodenum. Biopsied. Recommendation:       - Continue present medications.                       - Use sucralfate tablets 1 gram PO QID for 1 month.                       - Use sucralfate tablets 1 gram PO BID. Procedure Code(s):    --- Professional ---                       [phone], Esophagogastroduodenoscopy, flexible, transoral;                        with biopsy, single or multiple Diagnosis Code(s):    --- Professional ---                       K29.60, Other gastritis without bleeding                       K31.89, Other diseases of stomach and duodenum                       R10.13, Epigastric pain CPT copyright 2018 American Medical Association. All rights reserved. The codes documented in this report are preliminary and upon coder review may  be revised to meet current compliance requirements. Lollie Sails, MD 01/16/2018 10:19:57 AM This report has been signed electronically. Number of Addenda: 0 Note Initiated On: 01/16/2018 9:49 AM      [facility]

## 2018-01-16 NOTE — H&P (Signed)
Outpatient short stay form Pre-procedure 01/16/2018 9:42 AM Lollie Sails MD  Primary Physician: Merrie Roof PA  Reason for visit: EGD and colonoscopy  History of present illness: Patient is a 67 year old female presenting today as above.  There is a family history of colon polyps in a primary relative, sister.  She also has been having some dyspepsia.  She is never had an upper scope before.  She does take a PPI.  Patient also takes Plavix and aspirin which she is held for more than 5 days.  Last colonoscopy was 12/03/2012 with no evidence of polyps at that time.  She tolerated her prep well.    Current Facility-Administered Medications:  .  0.9 %  sodium chloride infusion, , Intravenous, Continuous, Lollie Sails, MD, Last Rate: 20 mL/hr at 01/16/18 0940, 1,000 mL at 01/16/18 0940  Medications Prior to Admission  Medication Sig Dispense Refill Last Dose  . Fluticasone-Salmeterol (ADVAIR) 250-50 MCG/DOSE AEPB Inhale 1 puff into the lungs 2 (two) times daily. 3 each 1 01/16/2018 at Unknown time  . isosorbide dinitrate (ISORDIL) 30 MG tablet Take 1 tablet (30 mg total) by mouth daily. 90 tablet 1 01/16/2018 at Unknown time  . losartan (COZAAR) 100 MG tablet Take 1 tablet (100 mg total) by mouth daily. 90 tablet 1 01/16/2018 at Unknown time  . metoprolol succinate (TOPROL-XL) 50 MG 24 hr tablet Take 1 tablet (50 mg total) by mouth daily. Take with or immediately following a meal. 90 tablet 1 01/16/2018 at Unknown time  . sertraline (ZOLOFT) 25 MG tablet Take 25 mg by mouth daily.     Marland Kitchen tiotropium (SPIRIVA) 18 MCG inhalation capsule Place 1 capsule (18 mcg total) into inhaler and inhale daily. 90 capsule 1 01/16/2018 at Unknown time  . albuterol (PROVENTIL HFA;VENTOLIN HFA) 108 (90 Base) MCG/ACT inhaler Inhale 2 puffs into the lungs every 6 (six) hours as needed for wheezing or shortness of breath. 3 Inhaler 3 Taking  . aspirin 81 MG tablet Take 81 mg by mouth daily.   Taking  .  atorvastatin (LIPITOR) 40 MG tablet Take 1 tablet (40 mg total) by mouth daily. 90 tablet 1 Taking  . cetirizine (ZYRTEC) 10 MG tablet Take 1 tablet (10 mg total) by mouth daily. 30 tablet 11 Taking  . clopidogrel (PLAVIX) 75 MG tablet Take 1 tablet (75 mg total) by mouth daily. 90 tablet 1 01/11/2018  . fluticasone (FLONASE) 50 MCG/ACT nasal spray Place 2 sprays into both nostrils daily. 3 g 3 Taking  . hydrochlorothiazide (HYDRODIURIL) 25 MG tablet Take 1 tablet (25 mg total) by mouth daily. 90 tablet 1 Taking  . Multiple Vitamins-Minerals (CENTRUM SILVER ADULT 50+ PO) Take 1 tablet by mouth daily.   Taking  . pantoprazole (PROTONIX) 20 MG tablet Take 1 tablet (20 mg total) by mouth daily. 90 tablet 3 Taking  . Probiotic Product (PROBIOTIC-10 PO) Take by mouth.   Taking     Allergies  Allergen Reactions  . Amoxicillin Rash  . Avelox [Moxifloxacin Hcl In Nacl] Rash  . Codeine Sulfate Nausea Only  . Doxycycline Diarrhea    Severe  . Penicillins Rash  . Sulfa Antibiotics Rash     Past Medical History:  Diagnosis Date  . COPD (chronic obstructive pulmonary disease) ([place])   . Depression   . GERD (gastroesophageal reflux disease)   . Hyperlipidemia   . Hypertension   . Menopause   . Osteopenia   . Tobacco abuse     Review of  systems:      Physical Exam    Heart and lungs: Good rate and rhythm without rub or gallop, lungs are bilaterally clear.    HEENT: Normocephalic atraumatic eyes are anicteric    Other:    Pertinant exam for procedure: Soft nontender nondistended bowel sounds positive normoactive.    Planned proceedures: EGD, colonoscopy and indicated procedures.    Lollie Sails, MD Gastroenterology 01/16/2018  9:42 AM

## 2018-01-16 NOTE — Anesthesia Preprocedure Evaluation (Signed)
Anesthesia Evaluation  Patient identified by MRN, date of birth, ID band Patient awake    Reviewed: Allergy & Precautions, NPO status , Patient's Chart, lab work & pertinent test results, reviewed documented beta blocker date and time   History of Anesthesia Complications Negative for: history of anesthetic complications  Airway Mallampati: III       Dental  (+) Upper Dentures, Missing   Pulmonary neg sleep apnea, COPD,  COPD inhaler, Current Smoker,           Cardiovascular hypertension, Pt. on medications and Pt. on home beta blockers (-) Past MI and (-) CHF (-) dysrhythmias      Neuro/Psych neg Seizures    GI/Hepatic Neg liver ROS, GERD  Medicated,  Endo/Other  neg diabetes  Renal/GU Renal InsufficiencyRenal disease     Musculoskeletal   Abdominal   Peds  Hematology  (+) anemia ,   Anesthesia Other Findings   Reproductive/Obstetrics                            Anesthesia Physical Anesthesia Plan  ASA: III  Anesthesia Plan: General   Post-op Pain Management:    Induction:   PONV Risk Score and Plan: 2 and Propofol infusion and TIVA  Airway Management Planned: Nasal Cannula  Additional Equipment:   Intra-op Plan:   Post-operative Plan:   Informed Consent: I have reviewed the patients History and Physical, chart, labs and discussed the procedure including the risks, benefits and alternatives for the proposed anesthesia with the patient or authorized representative who has indicated his/her understanding and acceptance.     Plan Discussed with:   Anesthesia Plan Comments:         Anesthesia Quick Evaluation

## 2018-01-16 NOTE — Transfer of Care (Signed)
2Immediate Anesthesia Transfer of Care Note  Patient: Kristi Clark  Procedure(s) Performed: ESOPHAGOGASTRODUODENOSCOPY (EGD) WITH PROPOFOL (N/A ) COLONOSCOPY WITH PROPOFOL (N/A )  Patient Location: PACU and Endoscopy Unit  Anesthesia Type:General  Level of Consciousness: sedated  Airway & Oxygen Therapy: Patient Spontanous Breathing and Patient connected to nasal cannula oxygen  Post-op Assessment: Report given to RN  Post vital signs: stable  Last Vitals:  Vitals Value Taken Time  BP 106/50 01/16/2018 10:49 AM  Temp 36.2 C 01/16/2018 10:49 AM  Pulse 62 01/16/2018 10:54 AM  Resp 17 01/16/2018 10:54 AM  SpO2 100 % 01/16/2018 10:54 AM  Vitals shown include unvalidated device data.  Last Pain:  Vitals:   01/16/18 1049  TempSrc: Tympanic  PainSc: Asleep         Complications: No apparent anesthesia complications

## 2018-01-17 ENCOUNTER — Encounter: Payer: Self-pay | Admitting: Gastroenterology

## 2018-01-19 LAB — SURGICAL PATHOLOGY

## 2018-02-05 ENCOUNTER — Ambulatory Visit (INDEPENDENT_AMBULATORY_CARE_PROVIDER_SITE_OTHER): Payer: Medicare Other | Admitting: Nurse Practitioner

## 2018-02-05 ENCOUNTER — Other Ambulatory Visit: Payer: Self-pay

## 2018-02-05 ENCOUNTER — Encounter: Payer: Self-pay | Admitting: Nurse Practitioner

## 2018-02-05 DIAGNOSIS — J069 Acute upper respiratory infection, unspecified: Secondary | ICD-10-CM | POA: Diagnosis not present

## 2018-02-05 MED ORDER — LORATADINE 10 MG PO TABS: 10.0000 mg | ORAL_TABLET | Freq: Every day | ORAL | 11 refills | Status: DC

## 2018-02-05 NOTE — Patient Instructions (Signed)
Loratadine oral solution or syrup What is this medicine? LORATADINE (lor AT a deen) is an antihistamine. It helps to relieve sneezing, runny nose, and itchy, watery eyes. This medicine is used to treat the symptoms of allergies. It is also used to treat itchy skin rash and hives. This medicine may be used for other purposes; ask your health care provider or pharmacist if you have questions. COMMON BRAND NAME(S): Alavert, Allergy Relief, Claritin, Clear-Atadine, Dimetapp Children's Non-Drowsy Allergy What should I tell my health care provider before I take this medicine? They need to know if you have any of these conditions: -asthma -kidney disease -liver disease -an unusual or allergic reaction to loratadine, other antihistamines, other medicines, foods, dyes, or preservatives -pregnant or trying to get pregnant -breast-feeding How should I use this medicine? Take this medicine by mouth. Follow the directions on the label. Use a specially marked spoon or container to measure each dose. Ask your pharmacist if you do not have one. Household spoons are not accurate. You may take this medicine with food or on an empty stomach. Take your medicine at regular intervals. Do not take your medicine more often than directed. Talk to your pediatrician regarding the use of this medicine in children. While this medicine may be used in children as young as 2 years for selected conditions, precautions do apply. Overdosage: If you think you have taken too much of this medicine contact a poison control center or emergency room at once. NOTE: This medicine is only for you. Do not share this medicine with others. What if I miss a dose? If you miss a dose, take it as soon as you can. If it is almost time for your next dose, take only that dose. Do not take double or extra doses. What may interact with this medicine? -other medicines for colds or allergies This list may not describe all possible interactions. Give  your health care provider a list of all the medicines, herbs, non-prescription drugs, or dietary supplements you use. Also tell them if you smoke, drink alcohol, or use illegal drugs. Some items may interact with your medicine. What should I watch for while using this medicine? Tell your doctor or healthcare professional if your symptoms do not start to get better or if they get worse. Your mouth may get dry. Chewing sugarless gum or sucking hard candy, and drinking plenty of water may help. Contact your doctor if the problem does not go away or is severe. You may get drowsy or dizzy. Do not drive, use machinery, or do anything that needs mental alertness until you know how this medicine affects you. Do not stand or sit up quickly, especially if you are an older patient. This reduces the risk of dizzy or fainting spells. What side effects may I notice from receiving this medicine? Side effects that you should report to your doctor or health care professional as soon as possible: -allergic reactions like skin rash, itching or hives, swelling of the face, lips, or tongue -breathing problems -unusually restless or nervous Side effects that usually do not require medical attention (report to your doctor or health care professional if they continue or are bothersome): -drowsiness -dry or irritated mouth or throat -headache This list may not describe all possible side effects. Call your doctor for medical advice about side effects. You may report side effects to FDA at 1-800-FDA-1088. Where should I keep my medicine? Keep out of the reach of children. Store at room temperature between 20 and  25 degrees C (68 and 77 degrees F). Throw away any unused medicine after the expiration date. NOTE: This sheet is a summary. It may not cover all possible information. If you have questions about this medicine, talk to your doctor, pharmacist, or health care provider.  2018 Elsevier/Gold Standard (2007-08-27  17:18:30)

## 2018-02-05 NOTE — Assessment & Plan Note (Addendum)
Symptoms improving per patient report.  Discussed abx stewardship with her.  Will utilize simple treatment at this time.  Switch from Zyrtec to Claritin.  Use Flonase daily as ordered.  Humidifier in room at night.  Return for worsening or continued symptoms.  Consider addition of Singulair if continued infections present and D/C OTC allergy oral meds.

## 2018-02-05 NOTE — Progress Notes (Signed)
BP 123/73   Pulse 81   Temp 98 F (36.7 C) (Oral)   Ht 5\' 3"  (1.6 m)   Wt 125 lb (56.7 kg)   LMP  (LMP Unknown)   SpO2 97%   BMI 22.14 kg/m    Subjective:    Patient ID: Kristi Clark, female    DOB: February 25, 1951, 67 y.o.   MRN: [id number]  HPI: Kristi Clark is a 67 y.o. female presents for URI symptoms  Chief Complaint  Patient presents with  . Sinusitis  . Cough    pt states has had sratchy throat x about a week   . Nasal Congestion   UPPER RESPIRATORY TRACT INFECTION Last COPD exacerbation was 01/10/18 and was treated with Z-Pack and Delsym.  Felt bad for a couple days "not last week, but the week before".  She did not present initially and did not present last week, as no appointment available.  Did not attend walk-in visit, as symptoms have been improving.  Reports symptoms have been improving as has been resting all weekend.  Rarely uses her Albuterol, has not used recently.  Reports she has been forgetting to take Flonase, which she reports helps when she takes.  Has been on Zyrtec for a "few year", prior to this was on Claritin which she reports "worked better".   Worst symptom: eye are "real tired" Fever: no Cough: intermittent cough Shortness of breath: no Wheezing: no Chest pain: no Chest tightness: no Chest congestion: no Nasal congestion: yes Runny nose: yes Post nasal drip: yes Sneezing: yes Sore throat: no Swollen glands: no Sinus pressure: reports a little, but improving Headache: no Face pain: no Toothache: no Ear pain: no none present Ear pressure: no none present Eyes red/itching:no Eye drainage/crusting: no  Vomiting: no Rash: no Fatigue: no Sick contacts: no Strep contacts: no  Context: better Recurrent sinusitis: no Relief with OTC cold/cough medications: yes  Treatments attempted: Tylenol    Relevant past medical, surgical, family and social history reviewed and updated as indicated. Interim medical history since our last  visit reviewed. Allergies and medications reviewed and updated.  Review of Systems  Constitutional: Negative for activity change, appetite change, diaphoresis, fatigue and fever.  HENT: Positive for congestion, postnasal drip, rhinorrhea, sinus pressure and sneezing. Negative for ear discharge, ear pain, hearing loss, sinus pain, sore throat, tinnitus and voice change.   Eyes: Negative for pain and visual disturbance.  Respiratory: Negative for cough, chest tightness, shortness of breath and wheezing.   Cardiovascular: Negative for chest pain, palpitations and leg swelling.  Gastrointestinal: Negative for abdominal distention, abdominal pain, constipation, diarrhea, nausea and vomiting.  Endocrine: Negative for cold intolerance, heat intolerance, polydipsia, polyphagia and polyuria.  Musculoskeletal: Negative.   Skin: Negative.   Neurological: Negative for dizziness, syncope, weakness, light-headedness, numbness and headaches.  Psychiatric/Behavioral: Negative.     Per HPI unless specifically indicated above     Objective:    BP 123/73   Pulse 81   Temp 98 F (36.7 C) (Oral)   Ht 5\' 3"  (1.6 m)   Wt 125 lb (56.7 kg)   LMP  (LMP Unknown)   SpO2 97%   BMI 22.14 kg/m   Wt Readings from Last 3 Encounters:  02/05/18 125 lb (56.7 kg)  01/16/18 127 lb (57.6 kg)  01/10/18 127 lb (57.6 kg)    Physical Exam  Constitutional: She is oriented to person, place, and time. She appears well-developed and well-nourished.  HENT:  Head: Normocephalic.  Right Ear: Hearing, tympanic membrane, external ear and ear canal normal.  Left Ear: Hearing, tympanic membrane, external ear and ear canal normal.  Nose: Nose normal. No mucosal edema or rhinorrhea. Right sinus exhibits no maxillary sinus tenderness and no frontal sinus tenderness. Left sinus exhibits no maxillary sinus tenderness and no frontal sinus tenderness.  Mouth/Throat: Uvula is midline, oropharynx is clear and moist and mucous  membranes are normal.  Eyes: Pupils are equal, round, and reactive to light. Conjunctivae and EOM are normal. Right eye exhibits no discharge. Left eye exhibits no discharge.  Neck: Normal range of motion. Neck supple. No JVD present. Carotid bruit is not present. No thyromegaly present.  Cardiovascular: Normal rate, regular rhythm and normal heart sounds.  Pulmonary/Chest: Effort normal and breath sounds normal.  Abdominal: Soft. Bowel sounds are normal.  Lymphadenopathy:       Head (right side): No submandibular, no tonsillar and no preauricular adenopathy present.       Head (left side): No submandibular, no tonsillar and no preauricular adenopathy present.    She has no cervical adenopathy.  Neurological: She is alert and oriented to person, place, and time. No cranial nerve deficit.  Skin: Skin is warm and dry.  Psychiatric: She has a normal mood and affect. Her behavior is normal. Judgment and thought content normal.  Nursing note and vitals reviewed.   Results for orders placed or performed in visit on 01/24/18  [facility] COLONOSCOPY  Result Value Ref Range   HM Colonoscopy See Report (in chart) See Report (in chart), Patient Reported      Assessment & Plan:   Problem List Items Addressed This Visit      Respiratory   Upper respiratory infection    Symptoms improving per patient report.  Discussed abx stewardship with her.  Will utilize simple treatment at this time.  Switch from Zyrtec to Claritin.  Use Flonase daily as ordered.  Humidifier in room at night.  Return for worsening or continued symptoms.  Consider addition of Singulair if continued infections present and D/C OTC allergy oral meds.          Follow up plan: Return if symptoms worsen or fail to improve.

## 2018-02-13 ENCOUNTER — Ambulatory Visit: Payer: Self-pay | Admitting: *Deleted

## 2018-02-13 DIAGNOSIS — K2960 Other gastritis without bleeding: Secondary | ICD-10-CM | POA: Diagnosis not present

## 2018-02-13 DIAGNOSIS — K582 Mixed irritable bowel syndrome: Secondary | ICD-10-CM | POA: Diagnosis not present

## 2018-02-13 NOTE — Telephone Encounter (Signed)
Pt called with having nasal congestion. She has a cough from the drainage running down the back of her throat. She denies a sore throat. Does not think she has a fever. She is blowing out white discharge. She was seen in the office on the 2nd. It was recommend that she stop the zyrtec and start the Claritin. She has not done that yet. No sore throat. She just feels bad overall. Home care advice given to her. Advised to use a humidifier to help ease her stuffiness, honey for the cough, and try Vitamin C and Zinc. Will call back for increase in symptoms. She will try at home and if she feels better, will cancel her appointment for Thursday. Routing to [employer].   Reason for Disposition . Zinc, vitamin C, and echinacea to treat the common cold, questions about  Answer Assessment - Initial Assessment Questions 1. ONSET: "When did the nasal discharge start?"      Over the weekend 2. AMOUNT: "How much discharge is there?"      Quite a bit 3. COUGH: "Do you have a cough?" If yes, ask: "Describe the color of your sputum" (clear, white, yellow, green)     Yes. No sputum 4. RESPIRATORY DISTRESS: "Describe your breathing."      Breathing fine right now 5. FEVER: "Do you have a fever?" If so, ask: "What is your temperature, how was it measured, and when did it start?"     Not checked temp 6. SEVERITY: "Overall, how bad are you feeling right now?" (e.g., doesn't interfere with normal activities, staying home from school/work, staying in bed)      Feels bad 7. OTHER SYMPTOMS: "Do you have any other symptoms?" (e.g., sore throat, earache, wheezing, vomiting)     Not a lot of wheezing and none now 8. PREGNANCY: "Is there any chance you are pregnant?" "When was your last menstrual period?"     n/a  Protocols used: COMMON COLD-A-AH

## 2018-02-15 ENCOUNTER — Ambulatory Visit (INDEPENDENT_AMBULATORY_CARE_PROVIDER_SITE_OTHER): Payer: Medicare Other | Admitting: Family Medicine

## 2018-02-15 ENCOUNTER — Encounter: Payer: Self-pay | Admitting: Family Medicine

## 2018-02-15 ENCOUNTER — Other Ambulatory Visit: Payer: Self-pay

## 2018-02-15 VITALS — BP 110/61 | HR 82 | Temp 98.1°F | Ht 59.0 in | Wt 125.0 lb

## 2018-02-15 DIAGNOSIS — J069 Acute upper respiratory infection, unspecified: Secondary | ICD-10-CM

## 2018-02-15 MED ORDER — AZITHROMYCIN 250 MG PO TABS: ORAL_TABLET | ORAL | 0 refills | Status: DC

## 2018-02-15 NOTE — Progress Notes (Signed)
BP 110/61   Pulse 82   Temp 98.1 F (36.7 C) (Oral)   Ht 4\' 11"  (1.499 m)   Wt 125 lb (56.7 kg)   LMP  (LMP Unknown)   SpO2 96%   BMI 25.25 kg/m    Subjective:    Patient ID: Kristi Clark, female    DOB: 1951-03-07, 67 y.o.   MRN: [id number]  HPI: Kristi Clark is a 67 y.o. female  Chief Complaint  Patient presents with  . Cough    f/u pt states she is filling bad, tiredness  . Nasal Congestion   Fatigue, weakness, productive cough worst at night, congestion, rhinorrhea x 1-2 weeks. Denies known fever, body aches, CP, significant DOE. Taking delsym, tylenol, and using nasal sprays with minimal relief.  Husband has also been sick with same sxs. Hx of COPD on spiriva, advair, and albuterol. Current smoker, not interested in quitting right now.   Relevant past medical, surgical, family and social history reviewed and updated as indicated. Interim medical history since our last visit reviewed. Allergies and medications reviewed and updated.  Review of Systems  Per HPI unless specifically indicated above     Objective:    BP 110/61   Pulse 82   Temp 98.1 F (36.7 C) (Oral)   Ht 4\' 11"  (1.499 m)   Wt 125 lb (56.7 kg)   LMP  (LMP Unknown)   SpO2 96%   BMI 25.25 kg/m   Wt Readings from Last 3 Encounters:  02/15/18 125 lb (56.7 kg)  02/05/18 125 lb (56.7 kg)  01/16/18 127 lb (57.6 kg)    Physical Exam Vitals signs and nursing note reviewed.  Constitutional:      Appearance: Normal appearance. She is not ill-appearing.  HENT:     Head: Atraumatic.     Right Ear: Tympanic membrane normal.     Left Ear: Tympanic membrane normal.     Nose: Congestion present.     Mouth/Throat:     Mouth: Mucous membranes are moist.     Pharynx: Posterior oropharyngeal erythema present.  Eyes:     Extraocular Movements: Extraocular movements intact.     Conjunctiva/sclera: Conjunctivae normal.  Neck:     Musculoskeletal: Normal range of motion and neck supple.    Cardiovascular:     Rate and Rhythm: Normal rate and regular rhythm.     Heart sounds: Normal heart sounds.  Pulmonary:     Effort: Pulmonary effort is normal.     Breath sounds: No wheezing.  Musculoskeletal: Normal range of motion.  Skin:    General: Skin is warm and dry.  Neurological:     Mental Status: She is alert and oriented to person, place, and time.  Psychiatric:        Mood and Affect: Mood normal.        Thought Content: Thought content normal.        Judgment: Judgment normal.     Results for orders placed or performed in visit on 01/24/18  [facility] COLONOSCOPY  Result Value Ref Range   HM Colonoscopy See Report (in chart) See Report (in chart), Patient Reported      Assessment & Plan:   Problem List Items Addressed This Visit      Respiratory   Upper respiratory infection - Primary    Tx with zpack, mucinex, and regular inhaler/asthma regimen. F/u if not improving. Supportive care reviewed      Relevant Medications   azithromycin (ZITHROMAX) 250  MG tablet       Follow up plan: Return for as scheduled.

## 2018-02-17 NOTE — Assessment & Plan Note (Signed)
Tx with zpack, mucinex, and regular inhaler/asthma regimen. F/u if not improving. Supportive care reviewed

## 2018-02-21 DIAGNOSIS — L578 Other skin changes due to chronic exposure to nonionizing radiation: Secondary | ICD-10-CM | POA: Diagnosis not present

## 2018-02-21 DIAGNOSIS — L814 Other melanin hyperpigmentation: Secondary | ICD-10-CM | POA: Diagnosis not present

## 2018-02-21 DIAGNOSIS — L988 Other specified disorders of the skin and subcutaneous tissue: Secondary | ICD-10-CM | POA: Diagnosis not present

## 2018-02-21 DIAGNOSIS — C441191 Basal cell carcinoma of skin of left upper eyelid, including canthus: Secondary | ICD-10-CM | POA: Diagnosis not present

## 2018-02-21 DIAGNOSIS — C4491 Basal cell carcinoma of skin, unspecified: Secondary | ICD-10-CM | POA: Diagnosis not present

## 2018-02-22 DIAGNOSIS — E782 Mixed hyperlipidemia: Secondary | ICD-10-CM | POA: Diagnosis not present

## 2018-02-22 DIAGNOSIS — R079 Chest pain, unspecified: Secondary | ICD-10-CM | POA: Diagnosis not present

## 2018-02-22 DIAGNOSIS — I2510 Atherosclerotic heart disease of native coronary artery without angina pectoris: Secondary | ICD-10-CM | POA: Diagnosis not present

## 2018-02-22 DIAGNOSIS — I10 Essential (primary) hypertension: Secondary | ICD-10-CM | POA: Diagnosis not present

## 2018-02-22 DIAGNOSIS — R0602 Shortness of breath: Secondary | ICD-10-CM | POA: Diagnosis not present

## 2018-02-26 ENCOUNTER — Ambulatory Visit
Admission: RE | Admit: 2018-02-26 | Discharge: 2018-02-26 | Disposition: A | Discharge status: Home / Self Care | Payer: Medicare Other | Source: Ambulatory Visit | Attending: Family Medicine | Admitting: Family Medicine

## 2018-02-26 ENCOUNTER — Ambulatory Visit (INDEPENDENT_AMBULATORY_CARE_PROVIDER_SITE_OTHER): Payer: Medicare Other | Admitting: Family Medicine

## 2018-02-26 ENCOUNTER — Encounter: Payer: Self-pay | Admitting: Family Medicine

## 2018-02-26 ENCOUNTER — Ambulatory Visit: Payer: Self-pay

## 2018-02-26 VITALS — BP 135/70 | HR 95 | Temp 99.0°F | Ht 59.0 in | Wt 126.0 lb

## 2018-02-26 DIAGNOSIS — J441 Chronic obstructive pulmonary disease with (acute) exacerbation: Secondary | ICD-10-CM

## 2018-02-26 DIAGNOSIS — R05 Cough: Secondary | ICD-10-CM

## 2018-02-26 DIAGNOSIS — R509 Fever, unspecified: Secondary | ICD-10-CM | POA: Diagnosis not present

## 2018-02-26 DIAGNOSIS — R058 Other specified cough: Secondary | ICD-10-CM

## 2018-02-26 LAB — VERITOR FLU A/B WAIVED
INFLUENZA A: NEGATIVE
INFLUENZA B: NEGATIVE

## 2018-02-26 MED ORDER — SUCRALFATE 1 G PO TABS: 1.0000 g | ORAL_TABLET | Freq: Three times a day (TID) | ORAL | 0 refills | Status: DC

## 2018-02-26 NOTE — Progress Notes (Signed)
BP 135/70 (BP Location: Left Arm, Patient Position: Sitting, Cuff Size: Normal)   Pulse 95   Temp 99 F (37.2 C)   Ht 4\' 11"  (1.499 m)   Wt 126 lb (57.2 kg)   LMP  (LMP Unknown)   SpO2 96%   BMI 25.45 kg/m    Subjective:    Patient ID: Kristi Clark, female    DOB: 06/05/1950, 67 y.o.   MRN: [id number]  HPI: Kristi Clark is a 67 y.o. female  Chief Complaint  Patient presents with  . URI    X 2 days, fever, cough, and feeling bad in general  . Constipation   Here today with 2 days of worsening fatigue, congestion, intermittent fevers, aches, and productive cough after being sick for about a month now. Treated 2 weeks ago with azithromycin which seemed to help a small amount initially. Compliant with inhaler and allergy regimen for her COPD and allergies. Taking OTC cough medicines with minimal relief. Lots of sick contacts.   Relevant past medical, surgical, family and social history reviewed and updated as indicated. Interim medical history since our last visit reviewed. Allergies and medications reviewed and updated.  Review of Systems  Per HPI unless specifically indicated above     Objective:    BP 135/70 (BP Location: Left Arm, Patient Position: Sitting, Cuff Size: Normal)   Pulse 95   Temp 99 F (37.2 C)   Ht 4\' 11"  (1.499 m)   Wt 126 lb (57.2 kg)   LMP  (LMP Unknown)   SpO2 96%   BMI 25.45 kg/m   Wt Readings from Last 3 Encounters:  02/26/18 126 lb (57.2 kg)  02/15/18 125 lb (56.7 kg)  02/05/18 125 lb (56.7 kg)    Physical Exam Vitals signs and nursing note reviewed.  Constitutional:      Appearance: Normal appearance.  HENT:     Head: Atraumatic.     Right Ear: Tympanic membrane and external ear normal.     Left Ear: Tympanic membrane and external ear normal.     Nose: Congestion present.     Mouth/Throat:     Mouth: Mucous membranes are moist.     Pharynx: Posterior oropharyngeal erythema present.  Eyes:     Extraocular Movements:  Extraocular movements intact.     Conjunctiva/sclera: Conjunctivae normal.  Neck:     Musculoskeletal: Normal range of motion and neck supple.  Cardiovascular:     Rate and Rhythm: Normal rate and regular rhythm.     Heart sounds: Normal heart sounds.  Pulmonary:     Effort: Pulmonary effort is normal.     Breath sounds: Wheezing present.  Musculoskeletal: Normal range of motion.  Skin:    General: Skin is warm and dry.  Neurological:     Mental Status: She is alert and oriented to person, place, and time.  Psychiatric:        Mood and Affect: Mood normal.        Thought Content: Thought content normal.     Results for orders placed or performed in visit on 02/26/18  Veritor Flu A/B Waived  Result Value Ref Range   Influenza A Negative Negative   Influenza B Negative Negative      Assessment & Plan:   Problem List Items Addressed This Visit    None    Visit Diagnoses    COPD exacerbation ([place])    -  Primary   Will obtain CXR given persistence and  now worsening course to r/o pneumonia. Continue inhalers, add doxy. May need prednisone if not better   Relevant Orders   DG Chest 2 View (Completed)   Fever, unspecified fever cause       Rapid flu neg, will treat for URI and obtain CXR to r/o pneumonia   Relevant Orders   Veritor Flu A/B Waived (Completed)       Follow up plan: Return if symptoms worsen or fail to improve.

## 2018-02-26 NOTE — Telephone Encounter (Signed)
C/o chest tightness.  Stated she feels there is "gook" in my chest.  Reported she has had a nagging cough since November. Has a lot of nasal drainage.  C/o episodes of coughing that she almost gags; coughing up "white" phlegm.  Stated she has had shortness of breath with activity.  Today, c/o left upper back pain, "in my lungs."  Temp. 100 degrees.  C/o "a little wheeze at times".  Reported taking ES Tylenol and rescue inhaler for her symptoms.  Reported she had an episode last week of feeling pain in left side of back, that radiated up into her left breast; unsure how long it lasted because she fell asleep.  Denied nausea or sweating, but felt short of breath.  Reported the above episode to her Cardiologist 12/19, and he ordered a chest CT; scheduled on Jan. 7th.  Appt. Scheduled today at 2:15 PM with PCP.  Care advice given per protocol.  Advised to call back if symptoms of chest pain, shortness of breath worsen.  Verb. Understanding; agreed with plan.          Reason for Disposition . SEVERE coughing spells (e.g., whooping sound after coughing, vomiting after coughing)  Answer Assessment - Initial Assessment Questions 1. ONSET: "When did the cough begin?"      In November 2. SEVERITY: "How bad is the cough today?"      Nagging cough; feels like she will vomit sometimes  3. RESPIRATORY DISTRESS: "Describe your breathing."     Shortness of breath with activity 4. FEVER: "Do you have a fever?" If so, ask: "What is your temperature, how was it measured, and when did it start?"    100 degrees  5. SPUTUM: "Describe the color of your sputum" (e.g., clear, white, yellow, green), "Has there been any change recently?"     White  6. HEMOPTYSIS: "Are you coughing up any blood?" If so ask: "How much, flecks, streaks, tablespoons, etc.?"     Some flecks of blood with blowing nose  7. CARDIAC HISTORY: "Do you have any history of heart disease?" (e.g., heart attack, congestive heart failure)      CAD 8.  LUNG HISTORY: "Do you have any history of lung disease?"  (e.g., pulmonary embolus, asthma, emphysema/COPD)    COPD 9. OTHER SYMPTOMS: "Do you have any other symptoms? (e.g., runny nose, wheezing, chest pain)     Cough, Fever, c/o feels like there is congestion in chest; c/o pain in left back, some intermittent wheeze     10. PREGNANCY: "Is there any chance you are pregnant?" "When was your last menstrual period?"      N/a  11. TRAVEL: "Have you traveled out of the country in the last month?" (e.g., travel history, exposures)       no  Protocols used: COUGH - CHRONIC-A-AH

## 2018-02-27 ENCOUNTER — Telehealth: Payer: Self-pay | Admitting: Family Medicine

## 2018-02-27 DIAGNOSIS — J189 Pneumonia, unspecified organism: Secondary | ICD-10-CM

## 2018-02-27 DIAGNOSIS — J181 Lobar pneumonia, unspecified organism: Principal | ICD-10-CM

## 2018-02-27 MED ORDER — DOXYCYCLINE HYCLATE 100 MG PO TABS: 100.0000 mg | ORAL_TABLET | Freq: Two times a day (BID) | ORAL | 0 refills | Status: DC

## 2018-02-27 NOTE — Telephone Encounter (Signed)
Called pt about x-ray showing left lobe pneumonia, doxycyline sent over and repeat x-ray ordered for 1 month. Call by end of week if not improving and follow up in 1 week for a recheck in clinic

## 2018-03-06 ENCOUNTER — Encounter: Payer: Self-pay | Admitting: Family Medicine

## 2018-03-06 ENCOUNTER — Ambulatory Visit (INDEPENDENT_AMBULATORY_CARE_PROVIDER_SITE_OTHER): Payer: Medicare Other | Admitting: Family Medicine

## 2018-03-06 VITALS — BP 93/55 | HR 91 | Temp 98.4°F | Ht 59.0 in | Wt 122.4 lb

## 2018-03-06 DIAGNOSIS — J181 Lobar pneumonia, unspecified organism: Secondary | ICD-10-CM | POA: Diagnosis not present

## 2018-03-06 DIAGNOSIS — J189 Pneumonia, unspecified organism: Secondary | ICD-10-CM

## 2018-03-06 NOTE — Progress Notes (Signed)
BP (!) 93/55 (BP Location: Right Arm, Patient Position: Sitting, Cuff Size: Normal)   Pulse 91   Temp 98.4 F (36.9 C) (Oral)   Ht 4\' 11"  (1.499 m)   Wt 122 lb 6.4 oz (55.5 kg)   LMP  (LMP Unknown)   SpO2 95%   BMI 24.72 kg/m    Subjective:    Patient ID: Kristi Clark, female    DOB: 1950/10/07, 67 y.o.   MRN: [id number]  HPI: Kristi Clark is a 67 y.o. female  Chief Complaint  Patient presents with  . Pneumonia    1 week F/U. Patient states she's feeling better, but doesn't have an appetite or energy. Cough and congestion is much better.    Pt presents today for 1 week f/u for left lower lobe pneumonia. Tolerating doxycycline well and starting to feel much better. Minimal cough, breathing back close to baseline, no fevers. Still feeling fatigued and lacking appetite. Using inhalers for her COPD compliantly. Still smoking daily.   Relevant past medical, surgical, family and social history reviewed and updated as indicated. Interim medical history since our last visit reviewed. Allergies and medications reviewed and updated.  Review of Systems  Per HPI unless specifically indicated above     Objective:    BP (!) 93/55 (BP Location: Right Arm, Patient Position: Sitting, Cuff Size: Normal)   Pulse 91   Temp 98.4 F (36.9 C) (Oral)   Ht 4\' 11"  (1.499 m)   Wt 122 lb 6.4 oz (55.5 kg)   LMP  (LMP Unknown)   SpO2 95%   BMI 24.72 kg/m   Wt Readings from Last 3 Encounters:  03/06/18 122 lb 6.4 oz (55.5 kg)  02/26/18 126 lb (57.2 kg)  02/15/18 125 lb (56.7 kg)    Physical Exam Vitals signs and nursing note reviewed.  Constitutional:      Appearance: Normal appearance. She is not ill-appearing.  HENT:     Head: Atraumatic.     Mouth/Throat:     Pharynx: Oropharynx is clear.  Eyes:     Extraocular Movements: Extraocular movements intact.     Conjunctiva/sclera: Conjunctivae normal.  Neck:     Musculoskeletal: Normal range of motion and neck supple.    Cardiovascular:     Rate and Rhythm: Normal rate and regular rhythm.     Heart sounds: Normal heart sounds.  Pulmonary:     Effort: Pulmonary effort is normal.     Breath sounds: Normal breath sounds. No wheezing or rales.     Comments: Breath sounds decreased throughout Musculoskeletal: Normal range of motion.  Skin:    General: Skin is warm and dry.  Neurological:     Mental Status: She is alert and oriented to person, place, and time.  Psychiatric:        Mood and Affect: Mood normal.        Thought Content: Thought content normal.        Judgment: Judgment normal.     Results for orders placed or performed in visit on 02/26/18  Veritor Flu A/B Waived  Result Value Ref Range   Influenza A Negative Negative   Influenza B Negative Negative      Assessment & Plan:   Problem List Items Addressed This Visit    None    Visit Diagnoses    Pneumonia of left lower lobe due to infectious organism ([place])    -  Primary   Significantly improved, complete remaining abx, continue supportive  care and regular inhaler regimen. F/u if worsening       Follow up plan: Return for as scheduled.

## 2018-03-13 DIAGNOSIS — I2510 Atherosclerotic heart disease of native coronary artery without angina pectoris: Secondary | ICD-10-CM | POA: Diagnosis not present

## 2018-03-13 DIAGNOSIS — R079 Chest pain, unspecified: Secondary | ICD-10-CM | POA: Diagnosis not present

## 2018-03-15 DIAGNOSIS — L821 Other seborrheic keratosis: Secondary | ICD-10-CM | POA: Diagnosis not present

## 2018-03-15 DIAGNOSIS — Z1283 Encounter for screening for malignant neoplasm of skin: Secondary | ICD-10-CM | POA: Diagnosis not present

## 2018-03-15 DIAGNOSIS — Z85828 Personal history of other malignant neoplasm of skin: Secondary | ICD-10-CM | POA: Diagnosis not present

## 2018-03-15 DIAGNOSIS — D229 Melanocytic nevi, unspecified: Secondary | ICD-10-CM | POA: Diagnosis not present

## 2018-03-16 DIAGNOSIS — Z720 Tobacco use: Secondary | ICD-10-CM | POA: Diagnosis not present

## 2018-03-16 DIAGNOSIS — R079 Chest pain, unspecified: Secondary | ICD-10-CM | POA: Diagnosis not present

## 2018-03-16 DIAGNOSIS — E782 Mixed hyperlipidemia: Secondary | ICD-10-CM | POA: Diagnosis not present

## 2018-03-16 DIAGNOSIS — I10 Essential (primary) hypertension: Secondary | ICD-10-CM | POA: Diagnosis not present

## 2018-03-16 DIAGNOSIS — I2510 Atherosclerotic heart disease of native coronary artery without angina pectoris: Secondary | ICD-10-CM | POA: Diagnosis not present

## 2018-03-29 ENCOUNTER — Ambulatory Visit
Admission: RE | Admit: 2018-03-29 | Discharge: 2018-03-29 | Disposition: A | Discharge status: Home / Self Care | Payer: Medicare Other | Source: Ambulatory Visit | Attending: Family Medicine | Admitting: Family Medicine

## 2018-03-29 DIAGNOSIS — J181 Lobar pneumonia, unspecified organism: Secondary | ICD-10-CM | POA: Insufficient documentation

## 2018-03-29 DIAGNOSIS — J189 Pneumonia, unspecified organism: Secondary | ICD-10-CM

## 2018-04-12 ENCOUNTER — Telehealth: Payer: Self-pay | Admitting: *Deleted

## 2018-04-12 NOTE — Telephone Encounter (Signed)
Received referral for lung screening scan, contacted patient who wants to call me back at a later day/time.

## 2018-04-17 ENCOUNTER — Encounter: Payer: Self-pay | Admitting: Family Medicine

## 2018-04-17 ENCOUNTER — Ambulatory Visit (INDEPENDENT_AMBULATORY_CARE_PROVIDER_SITE_OTHER): Payer: Medicare Other | Admitting: Family Medicine

## 2018-04-17 VITALS — BP 108/54 | HR 73 | Temp 98.4°F | Ht 59.0 in | Wt 122.7 lb

## 2018-04-17 DIAGNOSIS — Z20828 Contact with and (suspected) exposure to other viral communicable diseases: Secondary | ICD-10-CM | POA: Diagnosis not present

## 2018-04-17 DIAGNOSIS — J111 Influenza due to unidentified influenza virus with other respiratory manifestations: Secondary | ICD-10-CM | POA: Diagnosis not present

## 2018-04-17 LAB — VERITOR FLU A/B WAIVED
Influenza A: NEGATIVE
Influenza B: NEGATIVE

## 2018-04-17 NOTE — Progress Notes (Signed)
BP (!) 108/54 (BP Location: Right Arm, Patient Position: Sitting, Cuff Size: Normal)   Pulse 73   Temp 98.4 F (36.9 C) (Oral)   Ht 4\' 11"  (1.499 m)   Wt 122 lb 11.2 oz (55.7 kg)   LMP  (LMP Unknown)   SpO2 96%   BMI 24.78 kg/m    Subjective:    Patient ID: Kristi Clark, female    DOB: 05-19-1950, 68 y.o.   MRN: [id number]  HPI: Kristi Clark is a 68 y.o. female  Chief Complaint  Patient presents with  . Influenza Exposure    Husband was positive for Type A. She is his caregiver. Ongoing 4 days. Fever was 102 this morning. Took 2 tylenol for fever.  . Cough  . Nasal Congestion  . Generalized Body Aches  . Fatigue   3 days of fever of 102, generalized aches, congestion, cough. Husband positive for influenza A and has been in the hospital for that. Denies CP, SOB, N/V/D. Taking tylenol with minimal relief and using regular inhalers prescribed for her COPD.   Relevant past medical, surgical, family and social history reviewed and updated as indicated. Interim medical history since our last visit reviewed. Allergies and medications reviewed and updated.  Review of Systems  Per HPI unless specifically indicated above     Objective:    BP (!) 108/54 (BP Location: Right Arm, Patient Position: Sitting, Cuff Size: Normal)   Pulse 73   Temp 98.4 F (36.9 C) (Oral)   Ht 4\' 11"  (1.499 m)   Wt 122 lb 11.2 oz (55.7 kg)   LMP  (LMP Unknown)   SpO2 96%   BMI 24.78 kg/m   Wt Readings from Last 3 Encounters:  04/17/18 122 lb 11.2 oz (55.7 kg)  03/06/18 122 lb 6.4 oz (55.5 kg)  02/26/18 126 lb (57.2 kg)    Physical Exam Vitals signs and nursing note reviewed.  Constitutional:      Appearance: Normal appearance.  HENT:     Head: Atraumatic.     Right Ear: Tympanic membrane and external ear normal.     Left Ear: Tympanic membrane and external ear normal.     Nose: Congestion present.     Mouth/Throat:     Mouth: Mucous membranes are moist.     Pharynx:  Posterior oropharyngeal erythema present.  Eyes:     Extraocular Movements: Extraocular movements intact.     Conjunctiva/sclera: Conjunctivae normal.  Neck:     Musculoskeletal: Normal range of motion and neck supple.  Cardiovascular:     Rate and Rhythm: Normal rate and regular rhythm.     Heart sounds: Normal heart sounds.  Pulmonary:     Effort: Pulmonary effort is normal.     Breath sounds: Wheezing present.  Musculoskeletal: Normal range of motion.  Skin:    General: Skin is warm and dry.  Neurological:     Mental Status: She is alert and oriented to person, place, and time.  Psychiatric:        Mood and Affect: Mood normal.        Thought Content: Thought content normal.     Results for orders placed or performed in visit on 04/17/18  Veritor Flu A/B Waived  Result Value Ref Range   Influenza A Negative Negative   Influenza B Negative Negative      Assessment & Plan:   Problem List Items Addressed This Visit    None    Visit Diagnoses  Influenza    -  Primary   Rapid flu neg but sxs consistent. Tx with xofluza (sample given), OTC remedies and supportive care. F/u if worsening or not improving   Relevant Orders   Veritor Flu A/B Waived (Completed)       Follow up plan: Return for as scheduled.

## 2018-04-25 ENCOUNTER — Telehealth: Payer: Self-pay | Admitting: *Deleted

## 2018-04-25 ENCOUNTER — Encounter: Payer: Self-pay | Admitting: *Deleted

## 2018-04-25 DIAGNOSIS — Z122 Encounter for screening for malignant neoplasm of respiratory organs: Secondary | ICD-10-CM

## 2018-04-25 DIAGNOSIS — Z87891 Personal history of nicotine dependence: Secondary | ICD-10-CM

## 2018-04-25 NOTE — Telephone Encounter (Signed)
Received referral for initial lung cancer screening scan. Contacted patient and obtained smoking history,(current, 84 pack year) as well as answering questions related to screening process. Patient denies signs of lung cancer such as weight loss or hemoptysis. Patient denies comorbidity that would prevent curative treatment if lung cancer were found. Patient is scheduled for shared decision making visit and CT scan on 05/08/18 at 115pm.

## 2018-04-30 ENCOUNTER — Telehealth: Payer: Self-pay | Admitting: Family Medicine

## 2018-04-30 NOTE — Telephone Encounter (Signed)
Copied from [place] [id number]. Topic: Quick Communication - Rx Refill/Question >> Apr 30, 2018  8:23 AM Robina Ade, Helene Kelp D wrote: Medication: losartan (COZAAR) 100 MG tablet, hydrochlorothiazide (HYDRODIURIL) 25 MG tablet,metoprolol succinate (TOPROL-XL) 50 MG 24 hr tablet,pantoprazole (PROTONIX) 20 MG tablet,clopidogrel (PLAVIX) 75 MG tablet  Has the patient contacted their pharmacy? Yes, patient needs printer refill so she can send it to [facility] unless it can be faxed in. Please call patient when needs are faxed in or printed. (Agent: If no, request that the patient contact the pharmacy for the refill.) (Agent: If yes, when and what did the pharmacy advise?)  Preferred Pharmacy (with phone number or street name): [place]  Agent: Please be advised that RX refills may take up to 3 business days. We ask that you follow-up with your pharmacy. Reason for CRM: medical refill

## 2018-04-30 NOTE — Telephone Encounter (Signed)
Patient needs her refills printed or faxed to [place]. Please review for refills.

## 2018-05-02 MED ORDER — PANTOPRAZOLE SODIUM 20 MG PO TBEC: 20.0000 mg | DELAYED_RELEASE_TABLET | Freq: Every day | ORAL | 3 refills | Status: DC

## 2018-05-02 MED ORDER — HYDROCHLOROTHIAZIDE 25 MG PO TABS: 25.0000 mg | ORAL_TABLET | Freq: Every day | ORAL | 1 refills | Status: DC

## 2018-05-02 MED ORDER — METOPROLOL SUCCINATE ER 50 MG PO TB24: 50.0000 mg | ORAL_TABLET | Freq: Every day | ORAL | 1 refills | Status: DC

## 2018-05-02 MED ORDER — LOSARTAN POTASSIUM 100 MG PO TABS: 100.0000 mg | ORAL_TABLET | Freq: Every day | ORAL | 1 refills | Status: DC

## 2018-05-02 MED ORDER — CLOPIDOGREL BISULFATE 75 MG PO TABS: 75.0000 mg | ORAL_TABLET | Freq: Every day | ORAL | 1 refills | Status: DC

## 2018-05-02 NOTE — Telephone Encounter (Signed)
Rxs printed and ready to fax

## 2018-05-03 NOTE — Telephone Encounter (Signed)
Pt notified and will pick up

## 2018-05-07 ENCOUNTER — Telehealth: Payer: Self-pay | Admitting: *Deleted

## 2018-05-07 NOTE — Telephone Encounter (Signed)
Called pt to remind her of her appt for ldct screening on 05-07-2018@1315 , voiced understanding

## 2018-05-08 ENCOUNTER — Inpatient Hospital Stay: Payer: Medicare Other | Attending: Oncology | Admitting: Oncology

## 2018-05-08 ENCOUNTER — Ambulatory Visit
Admission: RE | Admit: 2018-05-08 | Discharge: 2018-05-08 | Disposition: A | Discharge status: Home / Self Care | Payer: Medicare Other | Source: Ambulatory Visit | Attending: Oncology | Admitting: Oncology

## 2018-05-08 DIAGNOSIS — Z122 Encounter for screening for malignant neoplasm of respiratory organs: Secondary | ICD-10-CM | POA: Insufficient documentation

## 2018-05-08 DIAGNOSIS — Z87891 Personal history of nicotine dependence: Secondary | ICD-10-CM

## 2018-05-08 DIAGNOSIS — F17210 Nicotine dependence, cigarettes, uncomplicated: Secondary | ICD-10-CM | POA: Diagnosis not present

## 2018-05-08 NOTE — Progress Notes (Signed)
In accordance with CMS guidelines, patient has met eligibility criteria including age, absence of signs or symptoms of lung cancer.  Social History   Tobacco Use  . Smoking status: Current Every Day Smoker    Packs/day: 1.50    Years: 56.00    Pack years: 84.00    Types: Cigarettes  . Smokeless tobacco: Never Used  Substance Use Topics  . Alcohol use: No  . Drug use: No     A shared decision-making session was conducted prior to the performance of CT scan. This includes one or more decision aids, includes benefits and harms of screening, follow-up diagnostic testing, over-diagnosis, false positive rate, and total radiation exposure.  Counseling on the importance of adherence to annual lung cancer LDCT screening, impact of co-morbidities, and ability or willingness to undergo diagnosis and treatment is imperative for compliance of the program.  Counseling on the importance of continued smoking cessation for former smokers; the importance of smoking cessation for current smokers, and information about tobacco cessation interventions have been given to patient including [place] and 1800 quit [place] programs.  Written order for lung cancer screening with LDCT has been given to the patient and any and all questions have been answered to the best of my abilities.   Yearly follow up will be coordinated by Burgess Estelle, Thoracic Navigator.  Faythe Casa, NP 05/08/2018 1:29 PM

## 2018-05-09 ENCOUNTER — Telehealth: Payer: Self-pay | Admitting: *Deleted

## 2018-05-09 MED ORDER — DOXYCYCLINE HYCLATE 100 MG PO TABS: 100.0000 mg | ORAL_TABLET | Freq: Two times a day (BID) | ORAL | 0 refills | Status: DC

## 2018-05-09 NOTE — Telephone Encounter (Signed)
Called and discussed with pt - she has appt with Dr. Raul Del Pulmonology tomorrow. Will start doxycycline and await advice of specialist tomorrow. Return precautions given if worsening, f/u for lung recheck in 1 week

## 2018-05-09 NOTE — Telephone Encounter (Signed)
Notified patient of LDCT lung cancer screening program results with recommendation for 4-6 week follow up imaging. Also notified of incidental findings noted below and is encouraged to discuss further with PCP who will receive a copy of this note and/or the CT report. Patient verbalizes understanding.   IMPRESSION: 1. Multilobar peribronchovascular nodularity and consolidation, worst in the left lower lobe, findings most indicative of bronchopneumonia. Findings preclude lung cancer screening. Lung-RADS 0, incomplete. Short-term follow-up in 4-6 weeks is recommended, after appropriate therapy, with repeat low-dose chest CT without contrast (please use the following order, "CT CHEST LCS NODULE FOLLOW-UP W/O CM"). These results will be called to the ordering clinician or representative by the Radiologist Assistant, and communication documented in the PACS or zVision Dashboard. 2. Trace left pleural effusion. 3. Right renal stone. 4. Aortic atherosclerosis (ICD10-170.0). Coronary artery calcification. 5.  Emphysema (ICD10-J43.9).

## 2018-05-09 NOTE — Addendum Note (Signed)
Addended by: Merrie Roof E on: 05/09/2018 05:01 PM   Modules accepted: Orders

## 2018-05-10 DIAGNOSIS — R0602 Shortness of breath: Secondary | ICD-10-CM | POA: Diagnosis not present

## 2018-05-10 DIAGNOSIS — J449 Chronic obstructive pulmonary disease, unspecified: Secondary | ICD-10-CM | POA: Diagnosis not present

## 2018-05-10 DIAGNOSIS — Z720 Tobacco use: Secondary | ICD-10-CM | POA: Diagnosis not present

## 2018-05-11 ENCOUNTER — Encounter: Payer: Self-pay | Admitting: *Deleted

## 2018-05-16 ENCOUNTER — Other Ambulatory Visit: Payer: Self-pay

## 2018-05-16 ENCOUNTER — Encounter: Payer: Self-pay | Admitting: Family Medicine

## 2018-05-16 ENCOUNTER — Ambulatory Visit (INDEPENDENT_AMBULATORY_CARE_PROVIDER_SITE_OTHER): Payer: Medicare Other | Admitting: Family Medicine

## 2018-05-16 VITALS — BP 127/70 | HR 73 | Temp 97.8°F | Ht 59.0 in | Wt 121.0 lb

## 2018-05-16 DIAGNOSIS — J181 Lobar pneumonia, unspecified organism: Secondary | ICD-10-CM | POA: Diagnosis not present

## 2018-05-16 DIAGNOSIS — I2510 Atherosclerotic heart disease of native coronary artery without angina pectoris: Secondary | ICD-10-CM

## 2018-05-16 DIAGNOSIS — Z720 Tobacco use: Secondary | ICD-10-CM | POA: Diagnosis not present

## 2018-05-16 DIAGNOSIS — E785 Hyperlipidemia, unspecified: Secondary | ICD-10-CM

## 2018-05-16 DIAGNOSIS — I129 Hypertensive chronic kidney disease with stage 1 through stage 4 chronic kidney disease, or unspecified chronic kidney disease: Secondary | ICD-10-CM

## 2018-05-16 DIAGNOSIS — J189 Pneumonia, unspecified organism: Secondary | ICD-10-CM

## 2018-05-16 NOTE — Progress Notes (Signed)
BP 127/70   Pulse 73   Temp 97.8 F (36.6 C) (Oral)   Ht 4\' 11"  (1.499 m)   Wt 121 lb (54.9 kg)   LMP  (LMP Unknown)   SpO2 94%   BMI 24.44 kg/m    Subjective:    Patient ID: Kristi Clark, female    DOB: [birth year], 68 y.o.   MRN: [id number]  HPI: Kristi Clark is a 68 y.o. female  Chief Complaint  Patient presents with  . Follow-up  . Hypertension  . Hyperlipidemia   Here today for 6 month f/u as well as a lung recheck after recent low dose screening chest CT revealed pneumonia. Started on doxycycline for this and was switched to clindamycin by Pulmonologist. Seeing specialist again in about 2 weeks for f/u. States overall she feels fine, just run down. No significant CP, SOB, wheezing. Hx of COPD and allergies, compliant with inhaler and allergy regimen.   BPs stable and wnl when checked at home. Compliant with medication - currently on losartan, metoprolol, and HCTZ. On isordil, plavix, and asa for CAD with hx of angina. Takes lipitor for cholesterol management. No CP, SOB, myalgias, claudication.   Relevant past medical, surgical, family and social history reviewed and updated as indicated. Interim medical history since our last visit reviewed. Allergies and medications reviewed and updated.  Review of Systems  Per HPI unless specifically indicated above     Objective:    BP 127/70   Pulse 73   Temp 97.8 F (36.6 C) (Oral)   Ht 4\' 11"  (1.499 m)   Wt 121 lb (54.9 kg)   LMP  (LMP Unknown)   SpO2 94%   BMI 24.44 kg/m   Wt Readings from Last 3 Encounters:  05/16/18 121 lb (54.9 kg)  05/08/18 122 lb (55.3 kg)  04/17/18 122 lb 11.2 oz (55.7 kg)    Physical Exam Vitals signs and nursing note reviewed.  Constitutional:      Appearance: Normal appearance. She is not ill-appearing.  HENT:     Head: Atraumatic.     Right Ear: Tympanic membrane normal.     Left Ear: Tympanic membrane normal.     Nose: Nose normal.     Mouth/Throat:     Mouth: Mucous  membranes are moist.     Pharynx: Oropharynx is clear.  Eyes:     Extraocular Movements: Extraocular movements intact.     Conjunctiva/sclera: Conjunctivae normal.  Neck:     Musculoskeletal: Normal range of motion and neck supple.  Cardiovascular:     Rate and Rhythm: Normal rate and regular rhythm.     Heart sounds: Normal heart sounds.  Pulmonary:     Effort: Pulmonary effort is normal.     Comments: Decreased breath sounds diffusely Musculoskeletal: Normal range of motion.  Skin:    General: Skin is warm and dry.  Neurological:     Mental Status: She is alert and oriented to person, place, and time.  Psychiatric:        Mood and Affect: Mood normal.        Thought Content: Thought content normal.        Judgment: Judgment normal.     Results for orders placed or performed in visit on 05/16/18  Lipid Panel w/o Chol/HDL Ratio  Result Value Ref Range   Cholesterol, Total 129 100 - 199 mg/dL   Triglycerides 99 0 - 149 mg/dL   HDL 56 >39 mg/dL   VLDL Cholesterol  Cal 20 5 - 40 mg/dL   LDL Calculated 53 0 - 99 mg/dL  Comprehensive metabolic panel  Result Value Ref Range   Glucose 99 65 - 99 mg/dL   BUN 13 8 - 27 mg/dL   Creatinine, Ser 0.90 0.57 - 1.00 mg/dL   GFR calc non Af Amer 65 >59 mL/min/1.73   GFR calc Af Amer 75 >59 mL/min/1.73   BUN/Creatinine Ratio 14 12 - 28   Sodium 127 (L) 134 - 144 mmol/L   Potassium 4.1 3.5 - 5.2 mmol/L   Chloride 87 (L) 96 - 106 mmol/L   CO2 28 20 - 29 mmol/L   Calcium 9.9 8.7 - 10.3 mg/dL   Total Protein 6.5 6.0 - 8.5 g/dL   Albumin 3.8 3.8 - 4.8 g/dL   Globulin, Total 2.7 1.5 - 4.5 g/dL   Albumin/Globulin Ratio 1.4 1.2 - 2.2   Bilirubin Total 0.3 0.0 - 1.2 mg/dL   Alkaline Phosphatase 96 39 - 117 IU/L   AST 25 0 - 40 IU/L   ALT 18 0 - 32 IU/L      Assessment & Plan:   Problem Clark Items Addressed This Visit      Cardiovascular and Mediastinum   Benign hypertension with chronic kidney disease    Stable and WNL, continue  current regimen      Relevant Orders   Comprehensive metabolic panel (Completed)   CAD (coronary artery disease)    Stable without recent angina. Continue current regimen, recheck lipids. Smoking cessation counseling given, pt not interested in quitting. Continue following with Cardiology as well        Other   Hyperlipidemia - Primary    Recheck lipids, adjust as needed. Continue working on lifestyle modifications      Relevant Orders   Lipid Panel w/o Chol/HDL Ratio (Completed)   Tobacco abuse    Not interested in quitting. Counseling and resources provided. 3 min spent in direct counseling today       Other Visit Diagnoses    Pneumonia of left lower lobe due to infectious organism [facility])       Seen on screening Chest CT about a week ago, tolerating clindamycin well. Following closely with Pulmonology and has repeat CT scheduled       Follow up plan: Return in about 6 months (around 11/16/2018) for 6 month f/u.

## 2018-05-17 LAB — COMPREHENSIVE METABOLIC PANEL
ALT: 18 IU/L (ref 0–32)
AST: 25 IU/L (ref 0–40)
Albumin/Globulin Ratio: 1.4 (ref 1.2–2.2)
Albumin: 3.8 g/dL (ref 3.8–4.8)
Alkaline Phosphatase: 96 IU/L (ref 39–117)
BUN/Creatinine Ratio: 14 (ref 12–28)
BUN: 13 mg/dL (ref 8–27)
Bilirubin Total: 0.3 mg/dL (ref 0.0–1.2)
CO2: 28 mmol/L (ref 20–29)
Calcium: 9.9 mg/dL (ref 8.7–10.3)
Chloride: 87 mmol/L — ABNORMAL LOW (ref 96–106)
Creatinine, Ser: 0.9 mg/dL (ref 0.57–1.00)
GFR calc Af Amer: 75 mL/min/{1.73_m2} (ref >59)
GFR, EST NON AFRICAN AMERICAN: 65 mL/min/{1.73_m2} (ref >59)
Globulin, Total: 2.7 g/dL (ref 1.5–4.5)
Glucose: 99 mg/dL (ref 65–99)
POTASSIUM: 4.1 mmol/L (ref 3.5–5.2)
Sodium: 127 mmol/L — ABNORMAL LOW (ref 134–144)
Total Protein: 6.5 g/dL (ref 6.0–8.5)

## 2018-05-17 LAB — LIPID PANEL W/O CHOL/HDL RATIO
Cholesterol, Total: 129 mg/dL (ref 100–199)
HDL: 56 mg/dL (ref >39)
LDL CALC: 53 mg/dL (ref 0–99)
Triglycerides: 99 mg/dL (ref 0–149)
VLDL Cholesterol Cal: 20 mg/dL (ref 5–40)

## 2018-05-20 NOTE — Assessment & Plan Note (Signed)
Recheck lipids, adjust as needed. Continue working on lifestyle modifications 

## 2018-05-20 NOTE — Assessment & Plan Note (Signed)
Stable and WNL, continue current regimen 

## 2018-05-20 NOTE — Assessment & Plan Note (Signed)
Not interested in quitting. Counseling and resources provided. 3 min spent in direct counseling today

## 2018-05-20 NOTE — Assessment & Plan Note (Signed)
Stable without recent angina. Continue current regimen, recheck lipids. Smoking cessation counseling given, pt not interested in quitting. Continue following with Cardiology as well

## 2018-05-23 DIAGNOSIS — R05 Cough: Secondary | ICD-10-CM | POA: Diagnosis not present

## 2018-05-23 DIAGNOSIS — R918 Other nonspecific abnormal finding of lung field: Secondary | ICD-10-CM | POA: Diagnosis not present

## 2018-05-23 DIAGNOSIS — J449 Chronic obstructive pulmonary disease, unspecified: Secondary | ICD-10-CM | POA: Diagnosis not present

## 2018-05-28 ENCOUNTER — Other Ambulatory Visit (HOSPITAL_COMMUNITY): Payer: Self-pay | Admitting: Specialist

## 2018-05-28 ENCOUNTER — Other Ambulatory Visit: Payer: Self-pay | Admitting: Specialist

## 2018-05-28 DIAGNOSIS — R05 Cough: Secondary | ICD-10-CM

## 2018-05-28 DIAGNOSIS — R059 Cough, unspecified: Secondary | ICD-10-CM

## 2018-05-28 DIAGNOSIS — R918 Other nonspecific abnormal finding of lung field: Secondary | ICD-10-CM

## 2018-06-11 ENCOUNTER — Other Ambulatory Visit: Payer: Self-pay | Admitting: *Deleted

## 2018-06-11 DIAGNOSIS — Z122 Encounter for screening for malignant neoplasm of respiratory organs: Secondary | ICD-10-CM

## 2018-06-11 DIAGNOSIS — R918 Other nonspecific abnormal finding of lung field: Secondary | ICD-10-CM

## 2018-06-11 DIAGNOSIS — Z87891 Personal history of nicotine dependence: Secondary | ICD-10-CM

## 2018-06-18 DIAGNOSIS — I2510 Atherosclerotic heart disease of native coronary artery without angina pectoris: Secondary | ICD-10-CM | POA: Diagnosis not present

## 2018-06-18 DIAGNOSIS — I10 Essential (primary) hypertension: Secondary | ICD-10-CM | POA: Diagnosis not present

## 2018-06-18 DIAGNOSIS — E782 Mixed hyperlipidemia: Secondary | ICD-10-CM | POA: Diagnosis not present

## 2018-06-18 DIAGNOSIS — R0602 Shortness of breath: Secondary | ICD-10-CM | POA: Diagnosis not present

## 2018-06-18 DIAGNOSIS — R079 Chest pain, unspecified: Secondary | ICD-10-CM | POA: Diagnosis not present

## 2018-06-20 ENCOUNTER — Encounter: Payer: Self-pay | Admitting: Family Medicine

## 2018-06-20 ENCOUNTER — Other Ambulatory Visit: Payer: Self-pay | Admitting: Family Medicine

## 2018-06-20 MED ORDER — SUCRALFATE 1 G PO TABS: 1.0000 g | ORAL_TABLET | Freq: Three times a day (TID) | ORAL | 0 refills | Status: DC

## 2018-06-20 NOTE — Telephone Encounter (Signed)
Requested medication (s) are due for refill today: Yes  Requested medication (s) are on the active medication list: Yes  Last refill:  02/26/18  Future visit scheduled: Yes  Notes to clinic:  Pt. Requesting a printed prescription.    Requested Prescriptions  Pending Prescriptions Disp Refills   sucralfate (CARAFATE) 1 g tablet 180 tablet 0    Sig: Take 1 tablet (1 g total) by mouth 4 (four) times daily -  with meals and at bedtime.     Gastroenterology: Antiacids Passed - 06/20/2018 10:53 AM      Passed - Valid encounter within last 12 months    Recent Outpatient Visits          1 month ago Hyperlipidemia, unspecified hyperlipidemia type   [facility] [place], [place]   2 months ago [place], [place], [place]   3 months ago Pneumonia of left lower lobe due to infectious organism [facility])   [place], [place], [place]   3 months ago COPD exacerbation [facility])   [place], [place], [place]   4 months ago Upper respiratory tract infection, unspecified type   [facility], [place], [place]      Future Appointments            In 3 months [facility], Lilia Argue, [place], [place]   In 3 months  [employer], [place]   In 4 months [place], [place] Arg[place], [place], [place]

## 2018-06-20 NOTE — Telephone Encounter (Signed)
Patient states she needs a printed RX so she can mail it in.

## 2018-07-03 ENCOUNTER — Ambulatory Visit
Admission: RE | Admit: 2018-07-03 | Discharge: 2018-07-03 | Disposition: A | Discharge status: Home / Self Care | Payer: Medicare Other | Source: Ambulatory Visit | Attending: Specialist | Admitting: Specialist

## 2018-07-03 ENCOUNTER — Other Ambulatory Visit: Payer: Self-pay

## 2018-07-03 DIAGNOSIS — Z122 Encounter for screening for malignant neoplasm of respiratory organs: Secondary | ICD-10-CM

## 2018-07-03 DIAGNOSIS — R918 Other nonspecific abnormal finding of lung field: Secondary | ICD-10-CM | POA: Diagnosis not present

## 2018-07-03 DIAGNOSIS — F17210 Nicotine dependence, cigarettes, uncomplicated: Secondary | ICD-10-CM | POA: Diagnosis not present

## 2018-07-03 DIAGNOSIS — Z87891 Personal history of nicotine dependence: Secondary | ICD-10-CM

## 2018-07-05 ENCOUNTER — Telehealth: Payer: Self-pay | Admitting: *Deleted

## 2018-07-05 NOTE — Telephone Encounter (Signed)
Notified patient of LDCT lung cancer screening program results with recommendation for 3 month follow up imaging. Also notified of incidental findings noted below and is encouraged to discuss further with PCP who will receive a copy of this note and/or the CT report. Patient verbalizes understanding. Patient reports she has scheduled appt with pulmonologist in 1 week.  IMPRESSION: 1. Lung-RADS 4B, suspicious. Numerous persistent irregular patchy nodular foci of consolidation throughout the mid to lower lungs bilaterally, more likely due to a recurrent/chronic infectious/inflammatory process such as aspiration or atypical mycobacterial infection (MAI) given associated bronchiectasis, mucoid impaction and tree-in-bud opacities. Findings are largely stable to mildly improved in the short interval since 05/08/2018. Follow up low-dose chest CT without contrast in 3 months (please use the following order, "CT CHEST LCS NODULE FOLLOW-UP W/O CM") is recommended. Additional imaging evaluation or consultation with Pulmonology or Thoracic Surgery recommended. 2. Three-vessel coronary atherosclerosis. 3. Stable trace left pleural effusion/thickening.  Aortic Atherosclerosis (ICD10-I70.0) and Emphysema (ICD10-J43.9).

## 2018-07-12 DIAGNOSIS — R0600 Dyspnea, unspecified: Secondary | ICD-10-CM | POA: Diagnosis not present

## 2018-07-12 DIAGNOSIS — J479 Bronchiectasis, uncomplicated: Secondary | ICD-10-CM | POA: Diagnosis not present

## 2018-07-12 DIAGNOSIS — J849 Interstitial pulmonary disease, unspecified: Secondary | ICD-10-CM | POA: Diagnosis not present

## 2018-07-12 DIAGNOSIS — J449 Chronic obstructive pulmonary disease, unspecified: Secondary | ICD-10-CM | POA: Diagnosis not present

## 2018-07-16 DIAGNOSIS — R0600 Dyspnea, unspecified: Secondary | ICD-10-CM | POA: Diagnosis not present

## 2018-07-16 DIAGNOSIS — J479 Bronchiectasis, uncomplicated: Secondary | ICD-10-CM | POA: Diagnosis not present

## 2018-07-16 DIAGNOSIS — J449 Chronic obstructive pulmonary disease, unspecified: Secondary | ICD-10-CM | POA: Diagnosis not present

## 2018-07-16 DIAGNOSIS — J849 Interstitial pulmonary disease, unspecified: Secondary | ICD-10-CM | POA: Diagnosis not present

## 2018-07-18 DIAGNOSIS — L812 Freckles: Secondary | ICD-10-CM | POA: Diagnosis not present

## 2018-07-18 DIAGNOSIS — Z85828 Personal history of other malignant neoplasm of skin: Secondary | ICD-10-CM | POA: Diagnosis not present

## 2018-07-18 DIAGNOSIS — D485 Neoplasm of uncertain behavior of skin: Secondary | ICD-10-CM | POA: Diagnosis not present

## 2018-08-02 DIAGNOSIS — D485 Neoplasm of uncertain behavior of skin: Secondary | ICD-10-CM | POA: Diagnosis not present

## 2018-08-14 ENCOUNTER — Encounter: Payer: Self-pay | Admitting: Family Medicine

## 2018-08-15 ENCOUNTER — Encounter: Payer: Self-pay | Admitting: Family Medicine

## 2018-08-15 MED ORDER — ATORVASTATIN CALCIUM 40 MG PO TABS: 40.0000 mg | ORAL_TABLET | Freq: Every day | ORAL | 1 refills | Status: DC

## 2018-08-16 MED ORDER — ATORVASTATIN CALCIUM 40 MG PO TABS: 40.0000 mg | ORAL_TABLET | Freq: Every day | ORAL | 1 refills | Status: DC

## 2018-08-23 DIAGNOSIS — L814 Other melanin hyperpigmentation: Secondary | ICD-10-CM | POA: Diagnosis not present

## 2018-08-23 DIAGNOSIS — L821 Other seborrheic keratosis: Secondary | ICD-10-CM | POA: Diagnosis not present

## 2018-08-23 DIAGNOSIS — D229 Melanocytic nevi, unspecified: Secondary | ICD-10-CM | POA: Diagnosis not present

## 2018-08-23 DIAGNOSIS — Z85828 Personal history of other malignant neoplasm of skin: Secondary | ICD-10-CM | POA: Diagnosis not present

## 2018-08-23 DIAGNOSIS — Z1283 Encounter for screening for malignant neoplasm of skin: Secondary | ICD-10-CM | POA: Diagnosis not present

## 2018-09-04 ENCOUNTER — Ambulatory Visit: Payer: Self-pay

## 2018-09-04 NOTE — Telephone Encounter (Signed)
Pt. Stated she has not felt good for about a week.  C/o body aches and fatigue.  Reported she has had intermittent cough and shortness of breath with activity.  Reported stuffy nose.  C/o intermittent pain with deep breath.   Denied fever/ chills.  Reported she has intermittent pain that radiates from her left mid back to the left breast "for years."  Stated that her niece, that lives with her, was tested for antibodies, and was notified, today, that she was positive for antibodies to the Coronavirus.   Pt. Is asking if she needs to have a COVID test?   She is a [place] for her husband and her sister.  Gave safe precaution guidelines.  Advised will send note to PCP to request further recommendations.  Pt. Verb. Understanding and agreed with plan.   Reason for Disposition . [1] COVID-19 infection suspected by caller or triager AND [2] mild symptoms (cough, fever, or others) AND [7] no complications or SOB  Answer Assessment - Initial Assessment Questions 1. COVID-19 DIAGNOSIS: "Who made your Coronavirus (COVID-19) diagnosis?" "Was it confirmed by a positive lab test?" If not diagnosed by a HCP, ask "Are there lots of cases (community spread) where you live?" (See public health department website, if unsure)     Present in Community; denied travel  2. ONSET: "When did the COVID-19 symptoms start?"      About one week 3. WORST SYMPTOM: "What is your worst symptom?" (e.g., cough, fever, shortness of breath, muscle aches)   Hurts in back with cough or taking a deep breath 4. COUGH: "Do you have a cough?" If so, ask: "How bad is the cough?"       Occasional ; productive in AM with yellow mucus 5. FEVER: "Do you have a fever?" If so, ask: "What is your temperature, how was it measured, and when did it start?"     Denied fever  6. RESPIRATORY STATUS: "Describe your breathing?" (e.g., shortness of breath, wheezing, unable to speak)      intermittent shortness of breath ; feels breathing harder with  activity of working in yard or walking a distance  7. BETTER-SAME-WORSE: "Are you getting better, staying the same or getting worse compared to yesterday?"  If getting worse, ask, "In what way?"     Feeling about the same over past week 8. HIGH RISK DISEASE: "Do you have any chronic medical problems?" (e.g., asthma, heart or lung disease, weak immune system, etc.)     COPD; is a smoker  9. PREGNANCY: "Is there any chance you are pregnant?" "When was your last menstrual period?"     N/A 10. OTHER SYMPTOMS: "Do you have any other symptoms?"  (e.g., chills, fatigue, headache, loss of smell or taste, muscle pain, sore throat)       Middle left back and radiates to left breast, body aches, fatigue ; reported occasional pain with taking deep breath; c/o headache; denied nausea; reported intermittent pain that moves from back to left breast for years.  Protocols used: CORONAVIRUS (COVID-19) DIAGNOSED OR SUSPECTED-A-AH

## 2018-09-05 ENCOUNTER — Other Ambulatory Visit: Payer: Self-pay

## 2018-09-05 ENCOUNTER — Encounter: Payer: Self-pay | Admitting: Family Medicine

## 2018-09-05 ENCOUNTER — Telehealth: Payer: Self-pay | Admitting: *Deleted

## 2018-09-05 ENCOUNTER — Ambulatory Visit (INDEPENDENT_AMBULATORY_CARE_PROVIDER_SITE_OTHER): Payer: Medicare Other | Admitting: Family Medicine

## 2018-09-05 VITALS — BP 144/50 | HR 71 | Temp 97.3°F | Ht 59.0 in | Wt 123.0 lb

## 2018-09-05 DIAGNOSIS — R6889 Other general symptoms and signs: Secondary | ICD-10-CM | POA: Diagnosis not present

## 2018-09-05 DIAGNOSIS — R059 Cough, unspecified: Secondary | ICD-10-CM

## 2018-09-05 DIAGNOSIS — R0981 Nasal congestion: Secondary | ICD-10-CM | POA: Diagnosis not present

## 2018-09-05 DIAGNOSIS — Z20822 Contact with and (suspected) exposure to covid-19: Secondary | ICD-10-CM

## 2018-09-05 DIAGNOSIS — R05 Cough: Secondary | ICD-10-CM

## 2018-09-05 DIAGNOSIS — Z20828 Contact with and (suspected) exposure to other viral communicable diseases: Secondary | ICD-10-CM

## 2018-09-05 DIAGNOSIS — I2510 Atherosclerotic heart disease of native coronary artery without angina pectoris: Secondary | ICD-10-CM

## 2018-09-05 NOTE — Telephone Encounter (Signed)
Pt scheduled for covid testing today @ 3:00 @ [facility]. Instructions given. Order placed

## 2018-09-05 NOTE — Telephone Encounter (Signed)
-----   Message from [place] American, [place] sent at 09/05/2018  1:10 PM EDT ----- Regarding: COVID 19 testing Patient with cough, congestion, body aches for about a week. Household member with recent positive antibody testing and pt wanting to get tested for acute infection Medicare

## 2018-09-05 NOTE — Progress Notes (Signed)
BP (!) 144/50   Pulse 71   Temp (!) 97.3 F (36.3 C) (Oral)   Ht 4\' 11"  (1.499 m)   Wt 123 lb (55.8 kg)   LMP  (LMP Unknown)   BMI 24.84 kg/m    Subjective:    Patient ID: Kristi Clark, female    DOB: [birth year], 68 y.o.   MRN: [id number]  HPI: Kristi Clark is a 68 y.o. female  Chief Complaint  Patient presents with  . Generalized Body Aches    x about a week. patient states that family member wast tested positive for covid 39 last weekend  . Back Pain  . Cough  . Shortness of Breath    . This visit was completed via WebEx due to the restrictions of the COVID-19 pandemic. All issues as above were discussed and addressed. Physical exam was done as above through visual confirmation on WebEx. If it was felt that the patient should be evaluated in the office, they were directed there. The patient verbally consented to this visit. . Location of the patient: home . Location of the provider: work . Those involved with this call:  . Provider: Merrie Roof, PA-C . CMA: Lesle Chris, [place] . Front Desk/Registration: Jill Side  . Time spent on call: 15 minutes with patient face to face via video conference. More than 50% of this time was spent in counseling and coordination of care. 5 minutes total spent in review of patient's record and preparation of their chart. I verified patient identity using two factors (patient name and date of birth). Patient consents verbally to being seen via telemedicine visit today.   Presenting today feeling achy for about a week now, mostly in her back. Having congestion, cough and SOB but thinks that's the heat and her COPD. Has not needed rescue inhaler. Niece who lives with them tested positive for the COVID 19 antibodies recently which has her very worried about if she has the infection personally. Denies fever, chills, sweats, N/V/D. Not taking anything beyond typical medications currently.   Relevant past medical, surgical, family and  social history reviewed and updated as indicated. Interim medical history since our last visit reviewed. Allergies and medications reviewed and updated.  Review of Systems  Per HPI unless specifically indicated above     Objective:    BP (!) 144/50   Pulse 71   Temp (!) 97.3 F (36.3 C) (Oral)   Ht 4\' 11"  (1.499 m)   Wt 123 lb (55.8 kg)   LMP  (LMP Unknown)   BMI 24.84 kg/m   Wt Readings from Last 3 Encounters:  09/05/18 123 lb (55.8 kg)  05/16/18 121 lb (54.9 kg)  05/08/18 122 lb (55.3 kg)    Physical Exam Vitals signs and nursing note reviewed.  Constitutional:      General: She is not in acute distress.    Appearance: Normal appearance.  HENT:     Head: Atraumatic.     Right Ear: External ear normal.     Left Ear: External ear normal.     Nose: Nose normal. No congestion.     Mouth/Throat:     Mouth: Mucous membranes are moist.     Pharynx: Oropharynx is clear. Posterior oropharyngeal erythema present.  Eyes:     Extraocular Movements: Extraocular movements intact.     Conjunctiva/sclera: Conjunctivae normal.  Neck:     Musculoskeletal: Normal range of motion.  Cardiovascular:     Comments: Unable to assess via  virtual visit Pulmonary:     Effort: Pulmonary effort is normal. No respiratory distress.     Comments: Walking around yard and speaking without significant respiratory effort Musculoskeletal: Normal range of motion.  Skin:    General: Skin is dry.     Findings: No erythema.  Neurological:     Mental Status: She is alert and oriented to person, place, and time.  Psychiatric:        Mood and Affect: Mood normal.        Thought Content: Thought content normal.        Judgment: Judgment normal.    Results for orders placed or performed in visit on 05/16/18  Lipid Panel w/o Chol/HDL Ratio  Result Value Ref Range   Cholesterol, Total 129 100 - 199 mg/dL   Triglycerides 99 0 - 149 mg/dL   HDL 56 >39 mg/dL   VLDL Cholesterol Cal 20 5 - 40 mg/dL    LDL Calculated 53 0 - 99 mg/dL  Comprehensive metabolic panel  Result Value Ref Range   Glucose 99 65 - 99 mg/dL   BUN 13 8 - 27 mg/dL   Creatinine, Ser 0.90 0.57 - 1.00 mg/dL   GFR calc non Af Amer 65 >59 mL/min/1.73   GFR calc Af Amer 75 >59 mL/min/1.73   BUN/Creatinine Ratio 14 12 - 28   Sodium 127 (L) 134 - 144 mmol/L   Potassium 4.1 3.5 - 5.2 mmol/L   Chloride 87 (L) 96 - 106 mmol/L   CO2 28 20 - 29 mmol/L   Calcium 9.9 8.7 - 10.3 mg/dL   Total Protein 6.5 6.0 - 8.5 g/dL   Albumin 3.8 3.8 - 4.8 g/dL   Globulin, Total 2.7 1.5 - 4.5 g/dL   Albumin/Globulin Ratio 1.4 1.2 - 2.2   Bilirubin Total 0.3 0.0 - 1.2 mg/dL   Alkaline Phosphatase 96 39 - 117 IU/L   AST 25 0 - 40 IU/L   ALT 18 0 - 32 IU/L      Assessment & Plan:   Problem List Items Addressed This Visit    None    Visit Diagnoses    Exposure to Covid-19 Virus    -  Primary   Nasal congestion       Cough        Will refer for COVID 19 testing given exposure at home, though discussed with patient that typically it takes weeks for the body to create the antibodies to come up positive in antibody testing so the family member's infection was likely not recent. Continue allergy and COPD regimen in meantime, call if worsening sxs. Breathing fairly stable currently per patient on typical inhalers.   Follow up plan: Return for as scheduled.

## 2018-09-07 ENCOUNTER — Encounter: Payer: Self-pay | Admitting: Family Medicine

## 2018-09-10 ENCOUNTER — Encounter: Payer: Self-pay | Admitting: Family Medicine

## 2018-09-10 ENCOUNTER — Other Ambulatory Visit: Payer: Self-pay | Admitting: Family Medicine

## 2018-09-10 LAB — NOVEL CORONAVIRUS, NAA: SARS-CoV-2, NAA: NOT DETECTED

## 2018-09-10 MED ORDER — SUCRALFATE 1 G PO TABS: 1.0000 g | ORAL_TABLET | Freq: Three times a day (TID) | ORAL | 0 refills | Status: DC

## 2018-09-10 NOTE — Telephone Encounter (Signed)
Pt requesting Carafate be sent to [facility].

## 2018-09-28 ENCOUNTER — Encounter: Payer: Self-pay | Admitting: Family Medicine

## 2018-09-28 ENCOUNTER — Other Ambulatory Visit: Payer: Self-pay

## 2018-09-28 ENCOUNTER — Ambulatory Visit (INDEPENDENT_AMBULATORY_CARE_PROVIDER_SITE_OTHER): Payer: Medicare Other | Admitting: Family Medicine

## 2018-09-28 VITALS — BP 120/61 | HR 72 | Temp 98.2°F | Ht 59.0 in | Wt 125.0 lb

## 2018-09-28 DIAGNOSIS — J449 Chronic obstructive pulmonary disease, unspecified: Secondary | ICD-10-CM | POA: Diagnosis not present

## 2018-09-28 DIAGNOSIS — I2510 Atherosclerotic heart disease of native coronary artery without angina pectoris: Secondary | ICD-10-CM

## 2018-09-28 DIAGNOSIS — Z720 Tobacco use: Secondary | ICD-10-CM | POA: Diagnosis not present

## 2018-09-28 NOTE — Progress Notes (Signed)
BP 120/61   Pulse 72   Temp 98.2 F (36.8 C) (Oral)   Ht 4\' 11"  (1.499 m)   Wt 125 lb (56.7 kg)   LMP  (LMP Unknown)   SpO2 98%   BMI 25.25 kg/m    Subjective:    Patient ID: Kristi Clark, female    DOB: [birth year], 69 y.o.   MRN: [id number]  HPI: Kristi Clark is a 68 y.o. female  Chief Complaint  Patient presents with  . Annual Exam  . Back Pain    middle left side. states pain goes around to her left breast. on and off x about a month   Patient presenting today for CPE but not due for a few more months. Does want to discuss her COPD while she is here. Still having issues with her breathing, the heat is making things worse she thinks. Denies significant SOB, chest pain, fevers, syncope, dizziness and does feel her inhalers are helping but would like her lungs checked because she had that pneumonia the past few months that she had trouble getting rid of.   Got a smoking cessation patch and will try that, wanting to quit badly but so far has just been successful cutting back on the amount a bit. Under a lot of stress so feels this is holding back her ability to quit.   Relevant past medical, surgical, family and social history reviewed and updated as indicated. Interim medical history since our last visit reviewed. Allergies and medications reviewed and updated.  Review of Systems  Per HPI unless specifically indicated above     Objective:    BP 120/61   Pulse 72   Temp 98.2 F (36.8 C) (Oral)   Ht 4\' 11"  (1.499 m)   Wt 125 lb (56.7 kg)   LMP  (LMP Unknown)   SpO2 98%   BMI 25.25 kg/m   Wt Readings from Last 3 Encounters:  09/28/18 125 lb (56.7 kg)  09/05/18 123 lb (55.8 kg)  05/16/18 121 lb (54.9 kg)    Physical Exam Vitals signs and nursing note reviewed.  Constitutional:      Appearance: Normal appearance. She is not ill-appearing.  HENT:     Head: Atraumatic.     Nose: Nose normal.     Mouth/Throat:     Mouth: Mucous membranes are moist.      Pharynx: Oropharynx is clear.  Eyes:     Extraocular Movements: Extraocular movements intact.     Conjunctiva/sclera: Conjunctivae normal.  Neck:     Musculoskeletal: Normal range of motion and neck supple.  Cardiovascular:     Rate and Rhythm: Normal rate and regular rhythm.     Heart sounds: Normal heart sounds.  Pulmonary:     Effort: Pulmonary effort is normal.     Breath sounds: No wheezing or rales.     Comments: Breath sounds decreased b/l Musculoskeletal: Normal range of motion.  Skin:    General: Skin is warm and dry.  Neurological:     Mental Status: She is alert and oriented to person, place, and time.  Psychiatric:        Mood and Affect: Mood normal.        Thought Content: Thought content normal.        Judgment: Judgment normal.     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 Continue current inhaler/allergy regimen, continue working toward smoking cessation.         Other   Tobacco abuse    Currently starting patches, continue working toward reduction/cessation          Follow up plan: Return in about 2 months (around 11/29/2018) for 6 month f/u with me, AWV with Tiffany.

## 2018-10-01 NOTE — Assessment & Plan Note (Signed)
O2 saturation and exam today reassuring and stable. Continue current inhaler/allergy regimen, continue working toward smoking cessation.

## 2018-10-01 NOTE — Assessment & Plan Note (Signed)
Currently starting patches, continue working toward reduction/cessation

## 2018-10-05 ENCOUNTER — Telehealth: Payer: Self-pay | Admitting: *Deleted

## 2018-10-05 DIAGNOSIS — Z87891 Personal history of nicotine dependence: Secondary | ICD-10-CM

## 2018-10-05 DIAGNOSIS — R918 Other nonspecific abnormal finding of lung field: Secondary | ICD-10-CM

## 2018-10-05 DIAGNOSIS — Z122 Encounter for screening for malignant neoplasm of respiratory organs: Secondary | ICD-10-CM

## 2018-10-05 NOTE — Telephone Encounter (Signed)
Patient contacted and scheduled for LCS nodule follow up, patient is asymptomatic of lung cancer and has a 84 pack year history and is a current smoker.

## 2018-10-09 ENCOUNTER — Ambulatory Visit
Admission: RE | Admit: 2018-10-09 | Discharge: 2018-10-09 | Disposition: A | Discharge status: Home / Self Care | Payer: Medicare Other | Source: Ambulatory Visit | Attending: Nurse Practitioner | Admitting: Nurse Practitioner

## 2018-10-09 ENCOUNTER — Other Ambulatory Visit: Payer: Self-pay

## 2018-10-09 DIAGNOSIS — J432 Centrilobular emphysema: Secondary | ICD-10-CM | POA: Diagnosis not present

## 2018-10-09 DIAGNOSIS — Z87891 Personal history of nicotine dependence: Secondary | ICD-10-CM | POA: Diagnosis not present

## 2018-10-09 DIAGNOSIS — R918 Other nonspecific abnormal finding of lung field: Secondary | ICD-10-CM | POA: Diagnosis not present

## 2018-10-09 DIAGNOSIS — Z122 Encounter for screening for malignant neoplasm of respiratory organs: Secondary | ICD-10-CM | POA: Diagnosis not present

## 2018-10-11 ENCOUNTER — Other Ambulatory Visit: Payer: Self-pay | Admitting: Family Medicine

## 2018-10-11 DIAGNOSIS — Z1231 Encounter for screening mammogram for malignant neoplasm of breast: Secondary | ICD-10-CM

## 2018-10-12 ENCOUNTER — Telehealth: Payer: Self-pay | Admitting: *Deleted

## 2018-10-12 NOTE — Telephone Encounter (Signed)
Notified patient of LDCT lung cancer screening program results with recommendation for 12 month follow up imaging. Also notified of incidental findings noted below and is encouraged to discuss further with PCP who will receive a copy of this note and/or the CT report. Patient verbalizes understanding.   IMPRESSION: 1. Lung-RADS 2S, benign appearance or behavior. Continue annual screening with low-dose chest CT without contrast in 12 months. 2. The "S" modifier above refers to potentially clinically significant non lung cancer related findings. Specifically, there is aortic atherosclerosis, in addition to left main and 3 vessel coronary artery disease. Please note that although the presence of coronary artery calcium documents the presence of coronary artery disease, the severity of this disease and any potential stenosis cannot be assessed on this non-gated CT examination. Assessment for potential risk factor modification, dietary therapy or pharmacologic therapy may be warranted, if clinically indicated. 3. Mild diffuse bronchial wall thickening with moderate centrilobular and paraseptal emphysema; imaging findings suggestive of underlying COPD.  Aortic Atherosclerosis (ICD10-I70.0) and Emphysema (ICD10-J43.9). 

## 2018-10-15 ENCOUNTER — Ambulatory Visit: Payer: Medicare Other

## 2018-10-18 DIAGNOSIS — J449 Chronic obstructive pulmonary disease, unspecified: Secondary | ICD-10-CM | POA: Diagnosis not present

## 2018-10-18 DIAGNOSIS — I10 Essential (primary) hypertension: Secondary | ICD-10-CM | POA: Diagnosis not present

## 2018-10-18 DIAGNOSIS — R0602 Shortness of breath: Secondary | ICD-10-CM | POA: Diagnosis not present

## 2018-10-18 DIAGNOSIS — E782 Mixed hyperlipidemia: Secondary | ICD-10-CM | POA: Diagnosis not present

## 2018-10-18 DIAGNOSIS — I2510 Atherosclerotic heart disease of native coronary artery without angina pectoris: Secondary | ICD-10-CM | POA: Diagnosis not present

## 2018-10-18 DIAGNOSIS — Z9861 Coronary angioplasty status: Secondary | ICD-10-CM | POA: Diagnosis not present

## 2018-10-18 DIAGNOSIS — F17210 Nicotine dependence, cigarettes, uncomplicated: Secondary | ICD-10-CM | POA: Diagnosis not present

## 2018-10-24 DIAGNOSIS — R079 Chest pain, unspecified: Secondary | ICD-10-CM | POA: Diagnosis not present

## 2018-10-26 DIAGNOSIS — M79606 Pain in leg, unspecified: Secondary | ICD-10-CM | POA: Diagnosis not present

## 2018-10-26 DIAGNOSIS — I10 Essential (primary) hypertension: Secondary | ICD-10-CM | POA: Diagnosis not present

## 2018-10-26 DIAGNOSIS — I2510 Atherosclerotic heart disease of native coronary artery without angina pectoris: Secondary | ICD-10-CM | POA: Diagnosis not present

## 2018-10-28 ENCOUNTER — Encounter: Payer: Self-pay | Admitting: Family Medicine

## 2018-10-29 ENCOUNTER — Telehealth: Payer: Self-pay

## 2018-10-29 MED ORDER — METOPROLOL SUCCINATE ER 50 MG PO TB24: 50.0000 mg | ORAL_TABLET | Freq: Every day | ORAL | 1 refills | Status: DC

## 2018-10-29 MED ORDER — LOSARTAN POTASSIUM 100 MG PO TABS: 100.0000 mg | ORAL_TABLET | Freq: Every day | ORAL | 1 refills | Status: DC

## 2018-10-29 MED ORDER — HYDROCHLOROTHIAZIDE 25 MG PO TABS: 25.0000 mg | ORAL_TABLET | Freq: Every day | ORAL | 1 refills | Status: DC

## 2018-10-29 NOTE — Telephone Encounter (Signed)
Patient last seen in July. Has upcoming appointment in September.

## 2018-11-02 DIAGNOSIS — J449 Chronic obstructive pulmonary disease, unspecified: Secondary | ICD-10-CM | POA: Diagnosis not present

## 2018-11-02 DIAGNOSIS — R0602 Shortness of breath: Secondary | ICD-10-CM | POA: Diagnosis not present

## 2018-11-02 DIAGNOSIS — I2510 Atherosclerotic heart disease of native coronary artery without angina pectoris: Secondary | ICD-10-CM | POA: Diagnosis not present

## 2018-11-02 DIAGNOSIS — E782 Mixed hyperlipidemia: Secondary | ICD-10-CM | POA: Diagnosis not present

## 2018-11-02 DIAGNOSIS — M79606 Pain in leg, unspecified: Secondary | ICD-10-CM | POA: Diagnosis not present

## 2018-11-02 DIAGNOSIS — I10 Essential (primary) hypertension: Secondary | ICD-10-CM | POA: Diagnosis not present

## 2018-11-02 DIAGNOSIS — Z9861 Coronary angioplasty status: Secondary | ICD-10-CM | POA: Diagnosis not present

## 2018-11-03 ENCOUNTER — Encounter: Payer: Self-pay | Admitting: Family Medicine

## 2018-11-05 ENCOUNTER — Other Ambulatory Visit: Payer: Self-pay | Admitting: Family Medicine

## 2018-11-05 MED ORDER — CLOPIDOGREL BISULFATE 75 MG PO TABS: 75.0000 mg | ORAL_TABLET | Freq: Every day | ORAL | 1 refills | Status: DC

## 2018-11-05 MED ORDER — PANTOPRAZOLE SODIUM 20 MG PO TBEC: 20.0000 mg | DELAYED_RELEASE_TABLET | Freq: Every day | ORAL | 3 refills | Status: DC

## 2018-11-06 DIAGNOSIS — Z9861 Coronary angioplasty status: Secondary | ICD-10-CM | POA: Diagnosis not present

## 2018-11-06 DIAGNOSIS — I10 Essential (primary) hypertension: Secondary | ICD-10-CM | POA: Diagnosis not present

## 2018-11-06 DIAGNOSIS — J449 Chronic obstructive pulmonary disease, unspecified: Secondary | ICD-10-CM | POA: Diagnosis not present

## 2018-11-06 DIAGNOSIS — R0602 Shortness of breath: Secondary | ICD-10-CM | POA: Diagnosis not present

## 2018-11-06 DIAGNOSIS — E782 Mixed hyperlipidemia: Secondary | ICD-10-CM | POA: Diagnosis not present

## 2018-11-06 DIAGNOSIS — I2510 Atherosclerotic heart disease of native coronary artery without angina pectoris: Secondary | ICD-10-CM | POA: Diagnosis not present

## 2018-11-06 DIAGNOSIS — M79606 Pain in leg, unspecified: Secondary | ICD-10-CM | POA: Diagnosis not present

## 2018-11-15 ENCOUNTER — Other Ambulatory Visit: Payer: Self-pay

## 2018-11-15 ENCOUNTER — Ambulatory Visit (INDEPENDENT_AMBULATORY_CARE_PROVIDER_SITE_OTHER): Payer: Medicare Other | Admitting: Family Medicine

## 2018-11-15 ENCOUNTER — Ambulatory Visit (INDEPENDENT_AMBULATORY_CARE_PROVIDER_SITE_OTHER): Payer: Medicare Other

## 2018-11-15 VITALS — BP 137/68 | HR 77 | Temp 98.5°F | Resp 16 | Ht 59.0 in | Wt 125.6 lb

## 2018-11-15 VITALS — BP 137/68 | HR 77 | Temp 98.5°F | Resp 16 | Ht 59.0 in | Wt 125.0 lb

## 2018-11-15 DIAGNOSIS — N182 Chronic kidney disease, stage 2 (mild): Secondary | ICD-10-CM

## 2018-11-15 DIAGNOSIS — E785 Hyperlipidemia, unspecified: Secondary | ICD-10-CM | POA: Diagnosis not present

## 2018-11-15 DIAGNOSIS — Z0000 Encounter for general adult medical examination without abnormal findings: Secondary | ICD-10-CM | POA: Diagnosis not present

## 2018-11-15 DIAGNOSIS — Z23 Encounter for immunization: Secondary | ICD-10-CM

## 2018-11-15 DIAGNOSIS — I2510 Atherosclerotic heart disease of native coronary artery without angina pectoris: Secondary | ICD-10-CM | POA: Diagnosis not present

## 2018-11-15 DIAGNOSIS — K219 Gastro-esophageal reflux disease without esophagitis: Secondary | ICD-10-CM | POA: Diagnosis not present

## 2018-11-15 DIAGNOSIS — Z720 Tobacco use: Secondary | ICD-10-CM | POA: Diagnosis not present

## 2018-11-15 DIAGNOSIS — J449 Chronic obstructive pulmonary disease, unspecified: Secondary | ICD-10-CM

## 2018-11-15 DIAGNOSIS — I129 Hypertensive chronic kidney disease with stage 1 through stage 4 chronic kidney disease, or unspecified chronic kidney disease: Secondary | ICD-10-CM | POA: Diagnosis not present

## 2018-11-15 MED ORDER — ISOSORBIDE DINITRATE 30 MG PO TABS: 30.0000 mg | ORAL_TABLET | Freq: Every day | ORAL | 1 refills | Status: DC

## 2018-11-15 MED ORDER — TIOTROPIUM BROMIDE MONOHYDRATE 18 MCG IN CAPS: 18.0000 ug | ORAL_CAPSULE | Freq: Every day | RESPIRATORY_TRACT | 3 refills | Status: DC

## 2018-11-15 NOTE — Progress Notes (Signed)
Subjective:   Kristi Clark is a 68 y.o. female who presents for Medicare Annual (Subsequent) preventive examination.  Review of Systems:   Cardiac Risk Factors include: advanced age (>20men, >4 women);family history of premature cardiovascular disease;smoking/ tobacco exposure     Objective:     Vitals: BP 137/68 (BP Location: Left Arm, Patient Position: Sitting, Cuff Size: Normal)   Pulse 77   Temp 98.5 F (36.9 C) (Oral)   Resp 16   Ht 4\' 11"  (1.499 m)   Wt 125 lb 9.6 oz (57 kg)   LMP  (LMP Unknown)   SpO2 98%   BMI 25.37 kg/m   Body mass index is 25.37 kg/m.  Advanced Directives 11/15/2018 01/16/2018 10/11/2017 10/03/2016 09/15/2015 07/14/2014  Does Patient Have a Medical Advance Directive? No No No Yes No No  Type of Advance Directive - - - [place] - -  Does patient want to make changes to medical advance directive? - - Yes (MAU/Ambulatory/Procedural Areas - Information given) - - -  Would patient like information on creating a medical advance directive? - - - - - No - patient declined information    Tobacco Social History   Tobacco Use  Smoking Status Current Every Day Smoker  . Packs/day: 1.50  . Years: 56.00  . Pack years: 84.00  . Types: Cigarettes  Smokeless Tobacco Never Used  Tobacco Comment   has the gum and patch.      Ready to quit: Yes Counseling given: Yes Comment: has the gum and patch.    Clinical Intake:  Pre-visit preparation completed: Yes  Pain : No/denies pain     Nutritional Status: BMI 25 -29 Overweight Nutritional Risks: None Diabetes: No  How often do you need to have someone help you when you read instructions, pamphlets, or other written materials from your doctor or pharmacy?: 1 - Never  Interpreter Needed?: No  Information entered by :: Tiffany Hill,LPN  Past Medical History:  Diagnosis Date  . COPD (chronic obstructive pulmonary disease) ([place])   . Depression   . GERD (gastroesophageal  reflux disease)   . Hyperlipidemia   . Hypertension   . Menopause   . Osteopenia   . Tobacco abuse    Past Surgical History:  Procedure Laterality Date  . ABDOMINAL HYSTERECTOMY    . CARDIAC CATHETERIZATION    . CHOLECYSTECTOMY  2014  . COLONOSCOPY WITH PROPOFOL N/A 01/16/2018   Procedure: COLONOSCOPY WITH PROPOFOL;  Surgeon: Lollie Sails, MD;  Location: [facility] ENDOSCOPY;  Service: Endoscopy;  Laterality: N/A;  . ESOPHAGOGASTRODUODENOSCOPY (EGD) WITH PROPOFOL N/A 01/16/2018   Procedure: ESOPHAGOGASTRODUODENOSCOPY (EGD) WITH PROPOFOL;  Surgeon: Lollie Sails, MD;  Location: [facility] ENDOSCOPY;  Service: Endoscopy;  Laterality: N/A;  . heart stent     Family History  Problem Relation Age of Onset  . Cancer Mother   . Stroke Mother   . Heart disease Father   . Hyperlipidemia Father   . Cancer Sister   . Diabetes Sister   . Breast cancer Sister 89  . Diabetes Brother   . Asthma Son   . Cancer Son   . Diabetes Daughter   . Hypertension Daughter   . Cancer Maternal Grandmother        gallbladder  . Diabetes Brother   . Heart disease Brother   . Breast cancer Paternal Aunt    Social History   Socioeconomic History  . Marital status: Married    Spouse name: Not on  file  . Number of children: Not on file  . Years of education: Not on file  . Highest education level: 10th grade  Occupational History  . Not on file  Social Needs  . Financial resource strain: Not hard at all  . Food insecurity    Worry: Never true    Inability: Never true  . Transportation needs    Medical: No    Non-medical: No  Tobacco Use  . Smoking status: Current Every Day Smoker    Packs/day: 1.50    Years: 56.00    Pack years: 84.00    Types: Cigarettes  . Smokeless tobacco: Never Used  . Tobacco comment: has the gum and patch.   Substance and Sexual Activity  . Alcohol use: No  . Drug use: No  . Sexual activity: Never  Lifestyle  . Physical activity    Days per week: 0 days     Minutes per session: 0 min  . Stress: Not at all  Relationships  . Social connections    Talks on phone: More than three times a week    Gets together: More than three times a week    Attends religious service: Never    Active member of club or organization: No    Attends meetings of clubs or organizations: Never    Relationship status: Married  Other Topics Concern  . Not on file  Social History Narrative  . Not on file    Outpatient Encounter Medications as of 11/15/2018  Medication Sig  . albuterol (PROVENTIL HFA;VENTOLIN HFA) 108 (90 Base) MCG/ACT inhaler Inhale 2 puffs into the lungs every 6 (six) hours as needed for wheezing or shortness of breath.  Marland Kitchen aspirin 81 MG tablet Take 81 mg by mouth daily.  Marland Kitchen atorvastatin (LIPITOR) 40 MG tablet Take 1 tablet (40 mg total) by mouth daily.  . Cetirizine HCl (ZYRTEC PO) Take by mouth daily.  . clopidogrel (PLAVIX) 75 MG tablet Take 1 tablet (75 mg total) by mouth daily.  . fluticasone (FLONASE) 50 MCG/ACT nasal spray Place 2 sprays into both nostrils daily.  . Fluticasone-Salmeterol (ADVAIR) 250-50 MCG/DOSE AEPB Inhale 1 puff into the lungs 2 (two) times daily.  . hydrochlorothiazide (HYDRODIURIL) 25 MG tablet Take 1 tablet (25 mg total) by mouth daily.  . isosorbide dinitrate (ISORDIL) 30 MG tablet Take 1 tablet (30 mg total) by mouth daily.  Marland Kitchen losartan (COZAAR) 100 MG tablet Take 1 tablet (100 mg total) by mouth daily.  . metoprolol succinate (TOPROL-XL) 50 MG 24 hr tablet Take 1 tablet (50 mg total) by mouth daily. Take with or immediately following a meal.  . Multiple Vitamins-Minerals (CENTRUM SILVER ADULT 50+ PO) Take 1 tablet by mouth daily.  . pantoprazole (PROTONIX) 20 MG tablet Take 1 tablet (20 mg total) by mouth daily.  . Probiotic Product (PROBIOTIC-10 PO) Take by mouth. Every other day  . sucralfate (CARAFATE) 1 g tablet Take 1 tablet (1 g total) by mouth 4 (four) times daily -  with meals and at bedtime.  Marland Kitchen tiotropium  (SPIRIVA) 18 MCG inhalation capsule Place 1 capsule (18 mcg total) into inhaler and inhale daily.   No facility-administered encounter medications on file as of 11/15/2018.     Activities of Daily Living In your present state of health, do you have any difficulty performing the following activities: 11/15/2018 09/28/2018  Hearing? N N  Comment no hearing aids -  Vision? N N  Comment eyeglasses, goes to [facility] -  Difficulty concentrating or making decisions? N N  Walking or climbing stairs? N N  Dressing or bathing? N N  Doing errands, shopping? N N  Preparing Food and eating ? N -  Using the Toilet? N -  In the past six months, have you accidently leaked urine? N -  Do you have problems with loss of bowel control? N -  Managing your Medications? N -  Managing your Finances? N -  Housekeeping or managing your Housekeeping? N -  Some recent data might be hidden    Patient Care Team: Volney American, PA-C as PCP - General (Family Medicine) Allyne Gee, MD as Consulting Physician (Cardiology) Erby Pian, MD as Referring Physician (Pulmonary Disease) Alfonso Patten, MD as Consulting Physician (Dermatology)    Assessment:   This is a routine wellness examination for [place].  Exercise Activities and Dietary recommendations Current Exercise Habits: The patient does not participate in regular exercise at present(stays active), Exercise limited by: None identified  Goals    . Quit Smoking     Smoking cessation discussed       Fall Risk: Fall Risk  11/15/2018 09/28/2018 10/11/2017 10/11/2017 10/03/2016  Falls in the past year? 0 0 No No No  Number falls in past yr: - 0 - - -  Injury with Fall? - 0 - - -    FALL RISK PREVENTION PERTAINING TO THE HOME:  Any stairs in or around the home? No, ramp with hand rail  If so, are there any without handrails? No   Home free of loose throw rugs in walkways, pet beds, electrical cords, etc? Yes  Adequate lighting  in your home to reduce risk of falls? Yes   ASSISTIVE DEVICES UTILIZED TO PREVENT FALLS:  Life alert? No  Use of a cane, walker or w/c? No  Grab bars in the bathroom? Yes  Shower chair or bench in shower? Yes  Elevated toilet seat or a handicapped toilet? Yes   DME ORDERS:  DME order needed?  No   TIMED UP AND GO:  Was the test performed? Yes .  Length of time to ambulate 10 feet: 10 sec.   GAIT:  Appearance of gait: Gait steady and fast without the use of an assistive device.  Education: Fall risk prevention has been discussed.  Intervention(s) required? No   DME/home health order needed?  No    Depression Screen PHQ 2/9 Scores 11/15/2018 09/28/2018 10/11/2017 10/11/2017  PHQ - 2 Score 0 1 1 1   PHQ- 9 Score - 3 4 4      Cognitive Function     6CIT Screen 11/15/2018 10/11/2017  What Year? 0 points 0 points  What month? 0 points 0 points  What time? 0 points 0 points  Count back from 20 0 points 0 points  Months in reverse 0 points 0 points  Repeat phrase 0 points 0 points  Total Score 0 0    Immunization History  Administered Date(s) Administered  . Fluad Quad(high Dose 65+) 11/15/2018  . Influenza, High Dose Seasonal PF 01/04/2016, 12/23/2016, 12/25/2017  . Influenza,inj,Quad PF,6+ Mos 02/11/2015  . Influenza-Unspecified 12/13/2013  . Pneumococcal Conjugate-13 08/14/2013  . Pneumococcal Polysaccharide-23 03/04/2014  . Td 12/03/1994  . Zoster 02/07/2011    Qualifies for Shingles Vaccine? Yes  Zostavax completed n/a. Due for Shingrix. Education has been provided regarding the importance of this vaccine. Pt has been advised to call insurance company to determine out of pocket expense. Advised may also receive  vaccine at local pharmacy or Health Dept. Verbalized acceptance and understanding.  Tdap: up to date   Flu Vaccine: Due for Flu vaccine. Does the patient want to receive this vaccine today?  yes  Pneumococcal Vaccine: up to date   Screening Tests Health  Maintenance  Topic Date Due  . INFLUENZA VACCINE  10/06/2018  . MAMMOGRAM  10/24/2019  . TETANUS/TDAP  06/21/2020  . COLONOSCOPY  01/17/2023  . DEXA SCAN  Completed  . Hepatitis C Screening  Completed  . PNA vac Low Risk Adult  Completed    Cancer Screenings:  Colorectal Screening: Completed 01/16/2018. Repeat every 5 years  Mammogram: scheduled for 11/16/2018  Bone Density: Completed 08/22/2012  Lung Cancer Screening: (Low Dose CT Chest recommended if Age 1-80 years, 30 pack-year currently smoking OR have quit w/in 15years.) does qualify.   Completed 10/11/2018   Additional Screening:  Hepatitis C Screening: does qualify; Completed 09/15/2015  Vision Screening: Recommended annual ophthalmology exams for early detection of glaucoma and other disorders of the eye. Is the patient up to date with their annual eye exam?  Yes  Who is the provider or what is the name of the office in which the pt attends annual eye exams? [facility]    Dental Screening: Recommended annual dental exams for proper oral hygiene  Community Resource Referral:  CRR required this visit?  No       Plan:  I have personally reviewed and addressed the Medicare Annual Wellness questionnaire and have noted the following in the patient's chart:  A. Medical and social history B. Use of alcohol, tobacco or illicit drugs  C. Current medications and supplements D. Functional ability and status E.  Nutritional status F.  Physical activity G. Advance directives H. List of other physicians I.  Hospitalizations, surgeries, and ER visits in previous 12 months J.  [place] such as hearing and vision if needed, cognitive and depression L. Referrals and appointments   In addition, I have reviewed and discussed with patient certain preventive protocols, quality metrics, and best practice recommendations. A written personalized care plan for preventive services as well as general preventive health  recommendations were provided to patient.  Signed,    Bevelyn Ngo, LPN  [phone] Nurse Health Advisor   Nurse Notes: none

## 2018-11-15 NOTE — Patient Instructions (Signed)
Kristi Clark , Thank you for taking time to come for your Medicare Wellness Visit. I appreciate your ongoing commitment to your health goals. Please review the following plan we discussed and let me know if I can assist you in the future.   Screening recommendations/referrals: Colonoscopy: completed 01/16/2018 Mammogram: scheduled for 11/16/2018 Bone Density: completed 08/22/2012 Recommended yearly ophthalmology/optometry visit for glaucoma screening and checkup Recommended yearly dental visit for hygiene and checkup  Vaccinations: Influenza vaccine: done today  Pneumococcal vaccine: up to date Tdap vaccine: up to date Shingles vaccine: shingrix eligible     Advanced directives: Advance directive discussed with you today. I have provided a copy for you to complete at home and have notarized. Once this is complete please bring a copy in to our office so we can scan it into your chart.  Conditions/risks identified: If you wish to quit smoking, help is available. For free tobacco cessation program offerings call the [facility] at 218-492-6434 or Live Well Line at 337-625-4546. You may also visit [email or web address] or email [email or web address] for more information on other programs.   Next appointment: Follow up in one year for your annual wellness visit.    Preventive Care 26 Years and Older, Female Preventive care refers to lifestyle choices and visits with your health care provider that can promote health and wellness. What does preventive care include?  A yearly physical exam. This is also called an annual well check.  Dental exams once or twice a year.  Routine eye exams. Ask your health care provider how often you should have your eyes checked.  Personal lifestyle choices, including:  Daily care of your teeth and gums.  Regular physical activity.  Eating a healthy diet.  Avoiding tobacco and drug use.  Limiting alcohol use.  Practicing safe sex.   Taking low-dose aspirin every day.  Taking vitamin and mineral supplements as recommended by your health care provider. What happens during an annual well check? The services and screenings done by your health care provider during your annual well check will depend on your age, overall health, lifestyle risk factors, and family history of disease. Counseling  Your health care provider may ask you questions about your:  Alcohol use.  Tobacco use.  Drug use.  Emotional well-being.  Home and relationship well-being.  Sexual activity.  Eating habits.  History of falls.  Memory and ability to understand (cognition).  Work and work Statistician.  Reproductive health. Screening  You may have the following tests or measurements:  Height, weight, and BMI.  Blood pressure.  Lipid and cholesterol levels. These may be checked every 5 years, or more frequently if you are over 16 years old.  Skin check.  Lung cancer screening. You may have this screening every year starting at age 69 if you have a 30-pack-year history of smoking and currently smoke or have quit within the past 15 years.  Fecal occult blood test (FOBT) of the stool. You may have this test every year starting at age 41.  Flexible sigmoidoscopy or colonoscopy. You may have a sigmoidoscopy every 5 years or a colonoscopy every 10 years starting at age 68.  Hepatitis C blood test.  Hepatitis B blood test.  Sexually transmitted disease (STD) testing.  Diabetes screening. This is done by checking your blood sugar (glucose) after you have not eaten for a while (fasting). You may have this done every 1-3 years.  Bone density scan. This is done to screen for osteoporosis. You  may have this done starting at age 51.  Mammogram. This may be done every 1-2 years. Talk to your health care provider about how often you should have regular mammograms. Talk with your health care provider about your test results, treatment  options, and if necessary, the need for more tests. Vaccines  Your health care provider may recommend certain vaccines, such as:  Influenza vaccine. This is recommended every year.  Tetanus, diphtheria, and acellular pertussis (Tdap, Td) vaccine. You may need a Td booster every 10 years.  Zoster vaccine. You may need this after age 24.  Pneumococcal 13-valent conjugate (PCV13) vaccine. One dose is recommended after age 25.  Pneumococcal polysaccharide (PPSV23) vaccine. One dose is recommended after age 6. Talk to your health care provider about which screenings and vaccines you need and how often you need them. This information is not intended to replace advice given to you by your health care provider. Make sure you discuss any questions you have with your health care provider. Document Released: 03/20/2015 Document Revised: 11/11/2015 Document Reviewed: 12/23/2014 Elsevier Interactive Patient Education  2017 [place] Prevention in the Home Falls can cause injuries. They can happen to people of all ages. There are many things you can do to make your home safe and to help prevent falls. What can I do on the outside of my home?  Regularly fix the edges of walkways and driveways and fix any cracks.  Remove anything that might make you trip as you walk through a door, such as a raised step or threshold.  Trim any bushes or trees on the path to your home.  Use bright outdoor lighting.  Clear any walking paths of anything that might make someone trip, such as rocks or tools.  Regularly check to see if handrails are loose or broken. Make sure that both sides of any steps have handrails.  Any raised decks and porches should have guardrails on the edges.  Have any leaves, snow, or ice cleared regularly.  Use sand or salt on walking paths during winter.  Clean up any spills in your garage right away. This includes oil or grease spills. What can I do in the bathroom?   Use night lights.  Install grab bars by the toilet and in the tub and shower. Do not use towel bars as grab bars.  Use non-skid mats or decals in the tub or shower.  If you need to sit down in the shower, use a plastic, non-slip stool.  Keep the floor dry. Clean up any water that spills on the floor as soon as it happens.  Remove soap buildup in the tub or shower regularly.  Attach bath mats securely with double-sided non-slip rug tape.  Do not have throw rugs and other things on the floor that can make you trip. What can I do in the bedroom?  Use night lights.  Make sure that you have a light by your bed that is easy to reach.  Do not use any sheets or blankets that are too big for your bed. They should not hang down onto the floor.  Have a firm chair that has side arms. You can use this for support while you get dressed.  Do not have throw rugs and other things on the floor that can make you trip. What can I do in the kitchen?  Clean up any spills right away.  Avoid walking on wet floors.  Keep items that you use a lot  in easy-to-reach places.  If you need to reach something above you, use a strong step stool that has a grab bar.  Keep electrical cords out of the way.  Do not use floor polish or wax that makes floors slippery. If you must use wax, use non-skid floor wax.  Do not have throw rugs and other things on the floor that can make you trip. What can I do with my stairs?  Do not leave any items on the stairs.  Make sure that there are handrails on both sides of the stairs and use them. Fix handrails that are broken or loose. Make sure that handrails are as long as the stairways.  Check any carpeting to make sure that it is firmly attached to the stairs. Fix any carpet that is loose or worn.  Avoid having throw rugs at the top or bottom of the stairs. If you do have throw rugs, attach them to the floor with carpet tape.  Make sure that you have a light switch  at the top of the stairs and the bottom of the stairs. If you do not have them, ask someone to add them for you. What else can I do to help prevent falls?  Wear shoes that:  Do not have high heels.  Have rubber bottoms.  Are comfortable and fit you well.  Are closed at the toe. Do not wear sandals.  If you use a stepladder:  Make sure that it is fully opened. Do not climb a closed stepladder.  Make sure that both sides of the stepladder are locked into place.  Ask someone to hold it for you, if possible.  Clearly mark and make sure that you can see:  Any grab bars or handrails.  First and last steps.  Where the edge of each step is.  Use tools that help you move around (mobility aids) if they are needed. These include:  Canes.  Walkers.  Scooters.  Crutches.  Turn on the lights when you go into a dark area. Replace any light bulbs as soon as they burn out.  Set up your furniture so you have a clear path. Avoid moving your furniture around.  If any of your floors are uneven, fix them.  If there are any pets around you, be aware of where they are.  Review your medicines with your doctor. Some medicines can make you feel dizzy. This can increase your chance of falling. Ask your doctor what other things that you can do to help prevent falls. This information is not intended to replace advice given to you by your health care provider. Make sure you discuss any questions you have with your health care provider. Document Released: 12/18/2008 Document Revised: 07/30/2015 Document Reviewed: 03/28/2014 Elsevier Interactive Patient Education  2017 [employer].

## 2018-11-15 NOTE — Progress Notes (Signed)
BP 137/68   Pulse 77   Temp 98.5 F (36.9 C) (Oral)   Resp 16   Ht 4\' 11"  (1.499 m)   Wt 125 lb (56.7 kg)   LMP  (LMP Unknown)   BMI 25.25 kg/m    Subjective:    Patient ID: Kristi Clark, female    DOB: February 16, 1951, 68 y.o.   MRN: [id number]  HPI: Kristi Clark is a 68 y.o. female  Chief Complaint  Patient presents with  . Hypertension    65m f/u  . Hyperlipidemia   Here today for 6 month f/u chronic conditions.   HTN - BPs 120s-130s/80s when checked at home, taking medications faithfully without side effects. Denies CP, SOB (beyond baseline with COPD), HAs, dizziness. Followed by Cardiology for this as well as CAD. On lipitor for cholesterol management.   Followed by Pulmonology for significant COPD. Stable on current inhaler regimen, no recent exacerbations. Has gotten the gum and patches to quit smoking but has not started it yet. Very much wanting to quit but also under a lot of stress.   On protonix for GERD, no breakthrough sxs.   Relevant past medical, surgical, family and social history reviewed and updated as indicated. Interim medical history since our last visit reviewed. Allergies and medications reviewed and updated.  Review of Systems  Per HPI unless specifically indicated above     Objective:    BP 137/68   Pulse 77   Temp 98.5 F (36.9 C) (Oral)   Resp 16   Ht 4\' 11"  (1.499 m)   Wt 125 lb (56.7 kg)   LMP  (LMP Unknown)   BMI 25.25 kg/m   Wt Readings from Last 3 Encounters:  11/15/18 125 lb (56.7 kg)  11/15/18 125 lb 9.6 oz (57 kg)  09/28/18 125 lb (56.7 kg)    Physical Exam Vitals signs and nursing note reviewed.  Constitutional:      Appearance: Normal appearance. She is not ill-appearing.  HENT:     Head: Atraumatic.  Eyes:     Extraocular Movements: Extraocular movements intact.     Conjunctiva/sclera: Conjunctivae normal.  Neck:     Musculoskeletal: Normal range of motion and neck supple.  Cardiovascular:     Rate and  Rhythm: Normal rate and regular rhythm.     Heart sounds: Normal heart sounds.  Pulmonary:     Effort: Pulmonary effort is normal.     Breath sounds: Normal breath sounds. No wheezing.  Musculoskeletal: Normal range of motion.  Skin:    General: Skin is warm and dry.  Neurological:     Mental Status: She is alert and oriented to person, place, and time.  Psychiatric:        Mood and Affect: Mood normal.        Thought Content: Thought content normal.        Judgment: Judgment normal.     Results for orders placed or performed in visit on 11/15/18  Comprehensive metabolic panel  Result Value Ref Range   Glucose 88 65 - 99 mg/dL   BUN 14 8 - 27 mg/dL   Creatinine, Ser 0.94 0.57 - 1.00 mg/dL   GFR calc non Af Amer 62 >59 mL/min/1.73   GFR calc Af Amer 72 >59 mL/min/1.73   BUN/Creatinine Ratio 15 12 - 28   Sodium 133 (L) 134 - 144 mmol/L   Potassium 4.5 3.5 - 5.2 mmol/L   Chloride 92 (L) 96 - 106 mmol/L  CO2 28 20 - 29 mmol/L   Calcium 9.8 8.7 - 10.3 mg/dL   Total Protein 6.9 6.0 - 8.5 g/dL   Albumin 4.6 3.8 - 4.8 g/dL   Globulin, Total 2.3 1.5 - 4.5 g/dL   Albumin/Globulin Ratio 2.0 1.2 - 2.2   Bilirubin Total 0.3 0.0 - 1.2 mg/dL   Alkaline Phosphatase 84 39 - 117 IU/L   AST 29 0 - 40 IU/L   ALT 17 0 - 32 IU/L  Lipid Panel w/o Chol/HDL Ratio  Result Value Ref Range   Cholesterol, Total 127 100 - 199 mg/dL   Triglycerides 113 0 - 149 mg/dL   HDL 60 >39 mg/dL   VLDL Cholesterol Cal 20 5 - 40 mg/dL   LDL Chol Calc (NIH) 47 0 - 99 mg/dL      Assessment & Plan:   Problem List Items Addressed This Visit      Cardiovascular and Mediastinum   Benign hypertension with chronic kidney disease    BPs stable and WNL, continue current regimen      Relevant Medications   isosorbide dinitrate (ISORDIL) 30 MG tablet   Other Relevant Orders   Comprehensive metabolic panel (Completed)   CAD (coronary artery disease)    Recheck lipids, adjust as needed. Continue current  regimen. 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 Continue present medications and work on smoking cessation      Relevant Medications   tiotropium (SPIRIVA) 18 MCG inhalation capsule     Digestive   GERD (gastroesophageal reflux disease)    Stable and under good control, continue current regimen        Genitourinary   CKD (chronic kidney disease), stage II    Recheck bmp, continue present medications        Other   Hyperlipidemia    Recheck lipids, continue current regimen      Relevant Medications   isosorbide dinitrate (ISORDIL) 30 MG tablet   Tobacco abuse    Plans to start patches and gums in the near future. Motivated to quit          Follow up plan: Return in about 6 months (around 05/15/2019) for 6 month f/u.

## 2018-11-16 ENCOUNTER — Ambulatory Visit: Admitting: Family Medicine

## 2018-11-16 ENCOUNTER — Encounter: Payer: Self-pay | Admitting: Family Medicine

## 2018-11-16 ENCOUNTER — Ambulatory Visit
Admission: RE | Admit: 2018-11-16 | Discharge: 2018-11-16 | Disposition: A | Discharge status: Home / Self Care | Payer: Medicare Other | Source: Ambulatory Visit | Attending: Family Medicine | Admitting: Family Medicine

## 2018-11-16 DIAGNOSIS — Z1231 Encounter for screening mammogram for malignant neoplasm of breast: Secondary | ICD-10-CM | POA: Insufficient documentation

## 2018-11-16 LAB — COMPREHENSIVE METABOLIC PANEL
ALT: 17 IU/L (ref 0–32)
AST: 29 IU/L (ref 0–40)
Albumin/Globulin Ratio: 2 (ref 1.2–2.2)
Albumin: 4.6 g/dL (ref 3.8–4.8)
Alkaline Phosphatase: 84 IU/L (ref 39–117)
BUN/Creatinine Ratio: 15 (ref 12–28)
BUN: 14 mg/dL (ref 8–27)
Bilirubin Total: 0.3 mg/dL (ref 0.0–1.2)
CO2: 28 mmol/L (ref 20–29)
Calcium: 9.8 mg/dL (ref 8.7–10.3)
Chloride: 92 mmol/L — ABNORMAL LOW (ref 96–106)
Creatinine, Ser: 0.94 mg/dL (ref 0.57–1.00)
GFR calc Af Amer: 72 mL/min/{1.73_m2} (ref >59)
GFR calc non Af Amer: 62 mL/min/{1.73_m2} (ref >59)
Globulin, Total: 2.3 g/dL (ref 1.5–4.5)
Glucose: 88 mg/dL (ref 65–99)
Potassium: 4.5 mmol/L (ref 3.5–5.2)
Sodium: 133 mmol/L — ABNORMAL LOW (ref 134–144)
Total Protein: 6.9 g/dL (ref 6.0–8.5)

## 2018-11-16 LAB — LIPID PANEL W/O CHOL/HDL RATIO
Cholesterol, Total: 127 mg/dL (ref 100–199)
HDL: 60 mg/dL (ref >39)
LDL Chol Calc (NIH): 47 mg/dL (ref 0–99)
Triglycerides: 113 mg/dL (ref 0–149)
VLDL Cholesterol Cal: 20 mg/dL (ref 5–40)

## 2018-11-16 NOTE — Telephone Encounter (Signed)
Patient called to make sure that her medication are filled at the correct pharmacy.  Pharmacy has been changed in patient's profile.

## 2018-11-16 NOTE — Telephone Encounter (Signed)
Called and left a message asking patient to call and let us know what pharmacy she uses.

## 2018-11-16 NOTE — Telephone Encounter (Signed)
Patient stated that she was returning [place] call to let her know her preferred pharmacy is [facility] [place], [place] - [address] [phone] (Phone)  [phone] (Fax)

## 2018-11-17 ENCOUNTER — Encounter: Payer: Self-pay | Admitting: Family Medicine

## 2018-11-20 NOTE — Assessment & Plan Note (Signed)
Stable and under good control, continue current regimen 

## 2018-11-20 NOTE — Assessment & Plan Note (Signed)
BPs stable and WNL, continue current regimen 

## 2018-11-20 NOTE — Assessment & Plan Note (Signed)
Recheck lipids, adjust as needed. Continue current regimen. Work on smoking cessation

## 2018-11-20 NOTE — Assessment & Plan Note (Signed)
Recheck lipids, continue current regimen 

## 2018-11-20 NOTE — Assessment & Plan Note (Signed)
Plans to start patches and gums in the near future. Motivated to quit

## 2018-11-20 NOTE — Assessment & Plan Note (Signed)
Followed by Pulmonology, stable on current regimen. Continue present medications and work on smoking cessation

## 2018-11-20 NOTE — Assessment & Plan Note (Signed)
Recheck bmp, continue present medications

## 2018-11-21 ENCOUNTER — Other Ambulatory Visit: Payer: Self-pay | Admitting: Family Medicine

## 2018-11-22 DIAGNOSIS — R0600 Dyspnea, unspecified: Secondary | ICD-10-CM | POA: Diagnosis not present

## 2018-11-22 DIAGNOSIS — J449 Chronic obstructive pulmonary disease, unspecified: Secondary | ICD-10-CM | POA: Diagnosis not present

## 2018-11-22 DIAGNOSIS — R918 Other nonspecific abnormal finding of lung field: Secondary | ICD-10-CM | POA: Diagnosis not present

## 2018-12-04 ENCOUNTER — Telehealth: Payer: Self-pay

## 2018-12-04 NOTE — Telephone Encounter (Signed)
Please send a prescription for advair generic and rescue inhaler  Copied from [place] [phone]. Topic: General - Other >> Nov 16, 2018  3:25 PM Rainey Pines A wrote: Patient stated that she was returning [place] call to let her know her preferred pharmacy is [facility] [place], [place] - [address] [phone] (Phone) [phone] (Fax) >> Dec 03, 2018  3:37 PM Keene Breath wrote: Patient called to find out why her medication was sent to the wrong pharmacy.  Please call patient to discuss.  CB# [phone]

## 2018-12-04 NOTE — Telephone Encounter (Signed)
Routing to provider  

## 2018-12-05 MED ORDER — FLUTICASONE-SALMETEROL 250-50 MCG/DOSE IN AEPB: 1.0000 | INHALATION_SPRAY | Freq: Two times a day (BID) | RESPIRATORY_TRACT | 1 refills | Status: DC

## 2018-12-05 MED ORDER — ALBUTEROL SULFATE HFA 108 (90 BASE) MCG/ACT IN AERS: 2.0000 | INHALATION_SPRAY | Freq: Four times a day (QID) | RESPIRATORY_TRACT | 5 refills | Status: DC | PRN

## 2018-12-05 NOTE — Telephone Encounter (Signed)
Called pt to let her know rxs have been sent, no answer, left vm

## 2018-12-05 NOTE — Telephone Encounter (Signed)
Rxs sent

## 2018-12-24 ENCOUNTER — Other Ambulatory Visit: Payer: Self-pay | Admitting: Family Medicine

## 2018-12-24 MED ORDER — FLUTICASONE-SALMETEROL 250-50 MCG/DOSE IN AEPB: 1.0000 | INHALATION_SPRAY | Freq: Two times a day (BID) | RESPIRATORY_TRACT | 1 refills | Status: DC

## 2019-01-02 ENCOUNTER — Ambulatory Visit (INDEPENDENT_AMBULATORY_CARE_PROVIDER_SITE_OTHER): Payer: Medicare Other | Admitting: Family Medicine

## 2019-01-02 ENCOUNTER — Encounter: Payer: Self-pay | Admitting: Family Medicine

## 2019-01-02 ENCOUNTER — Other Ambulatory Visit: Payer: Self-pay

## 2019-01-02 VITALS — BP 139/61 | HR 80 | Wt 127.4 lb

## 2019-01-02 DIAGNOSIS — K219 Gastro-esophageal reflux disease without esophagitis: Secondary | ICD-10-CM

## 2019-01-02 DIAGNOSIS — R21 Rash and other nonspecific skin eruption: Secondary | ICD-10-CM

## 2019-01-02 DIAGNOSIS — J449 Chronic obstructive pulmonary disease, unspecified: Secondary | ICD-10-CM

## 2019-01-02 DIAGNOSIS — I2510 Atherosclerotic heart disease of native coronary artery without angina pectoris: Secondary | ICD-10-CM

## 2019-01-02 MED ORDER — PREDNISONE 20 MG PO TABS: 40.0000 mg | ORAL_TABLET | Freq: Every day | ORAL | 0 refills | Status: DC

## 2019-01-02 MED ORDER — PANTOPRAZOLE SODIUM 40 MG PO TBEC: 40.0000 mg | DELAYED_RELEASE_TABLET | Freq: Every day | ORAL | 0 refills | Status: DC

## 2019-01-02 MED ORDER — NYSTATIN 100000 UNIT/GM EX CREA: 1.0000 "application " | TOPICAL_CREAM | Freq: Two times a day (BID) | CUTANEOUS | 0 refills | Status: DC

## 2019-01-02 NOTE — Progress Notes (Signed)
BP 139/61   Pulse 80   Wt 127 lb 6 oz (57.8 kg)   LMP  (LMP Unknown)   BMI 25.73 kg/m    Subjective:    Patient ID: Kristi Clark, female    DOB: 1951-02-04, 68 y.o.   MRN: [id number]  HPI: Kristi Clark is a 68 y.o. female  Chief Complaint  Patient presents with  . Rash    between legs. redish and itches since yesterday  . Heartburn    x few days ago. has tried OTC peptobismol  . Cough    dry    . This visit was completed via WebEx due to the restrictions of the COVID-19 pandemic. All issues as above were discussed and addressed. Physical exam was done as above through visual confirmation on WebEx. If it was felt that the patient should be evaluated in the office, they were directed there. The patient verbally consented to this visit. . Location of the patient: home . Location of the provider: work . Those involved with this call:  . Provider: Merrie Roof, PA-C . CMA: Tiffany Reel, CMA . Front Desk/Registration: Jill Side  . Time spent on call: 25 minutes with patient face to face via video conference. More than 50% of this time was spent in counseling and coordination of care. 5 minutes total spent in review of patient's record and preparation of their chart. I verified patient identity using two factors (patient name and date of birth). Patient consents verbally to being seen via telemedicine visit today.   Presenting today for multiple concerns.   Rash in creases of groin and on waist, noticed the last couple of days. Itches but does not hurt. Not draining. No new soaps or products.   Has been cleaning out an old building with lots of dust and rat droppings and now having sinus pressure, bad headaches, dry cough, some wheezing. Denies fevers, chills, sweats, body aches, CP, SOB, sick contacts. Hx of COPD and allergic rhinitis, compliant with inhalers and allergy regimen.   Started clindamycin for a dental procedure and feels since the bottom teeth were  pulled she's been having worsened reflux. Using pepto bismol and carafate with mild relief.   Relevant past medical, surgical, family and social history reviewed and updated as indicated. Interim medical history since our last visit reviewed. Allergies and medications reviewed and updated.  Review of Systems  Per HPI unless specifically indicated above     Objective:    BP 139/61   Pulse 80   Wt 127 lb 6 oz (57.8 kg)   LMP  (LMP Unknown)   BMI 25.73 kg/m   Wt Readings from Last 3 Encounters:  01/02/19 127 lb 6 oz (57.8 kg)  11/15/18 125 lb (56.7 kg)  11/15/18 125 lb 9.6 oz (57 kg)    Physical Exam Vitals signs and nursing note reviewed.  Constitutional:      General: She is not in acute distress.    Appearance: Normal appearance.  HENT:     Head: Atraumatic.     Right Ear: External ear normal.     Left Ear: External ear normal.     Nose: Nose normal. No congestion.     Mouth/Throat:     Mouth: Mucous membranes are moist.     Pharynx: Oropharynx is clear. No posterior oropharyngeal erythema.  Eyes:     Extraocular Movements: Extraocular movements intact.     Conjunctiva/sclera: Conjunctivae normal.  Neck:     Musculoskeletal: Normal  range of motion.  Cardiovascular:     Comments: Unable to assess via virtual visit Pulmonary:     Effort: Pulmonary effort is normal. No respiratory distress.  Musculoskeletal: Normal range of motion.  Skin:    General: Skin is dry.     Findings: No erythema.  Neurological:     Mental Status: She is alert and oriented to person, place, and time.  Psychiatric:        Mood and Affect: Mood normal.        Thought Content: Thought content normal.        Judgment: Judgment normal.     Results for orders placed or performed in visit on 11/15/18  Comprehensive metabolic panel  Result Value Ref Range   Glucose 88 65 - 99 mg/dL   BUN 14 8 - 27 mg/dL   Creatinine, Ser 0.94 0.57 - 1.00 mg/dL   GFR calc non Af Amer 62 >59 mL/min/1.73    GFR calc Af Amer 72 >59 mL/min/1.73   BUN/Creatinine Ratio 15 12 - 28   Sodium 133 (L) 134 - 144 mmol/L   Potassium 4.5 3.5 - 5.2 mmol/L   Chloride 92 (L) 96 - 106 mmol/L   CO2 28 20 - 29 mmol/L   Calcium 9.8 8.7 - 10.3 mg/dL   Total Protein 6.9 6.0 - 8.5 g/dL   Albumin 4.6 3.8 - 4.8 g/dL   Globulin, Total 2.3 1.5 - 4.5 g/dL   Albumin/Globulin Ratio 2.0 1.2 - 2.2   Bilirubin Total 0.3 0.0 - 1.2 mg/dL   Alkaline Phosphatase 84 39 - 117 IU/L   AST 29 0 - 40 IU/L   ALT 17 0 - 32 IU/L  Lipid Panel w/o Chol/HDL Ratio  Result Value Ref Range   Cholesterol, Total 127 100 - 199 mg/dL   Triglycerides 113 0 - 149 mg/dL   HDL 60 >39 mg/dL   VLDL Cholesterol Cal 20 5 - 40 mg/dL   LDL Chol Calc (NIH) 47 0 - 99 mg/dL      Assessment & Plan:   Problem List Items Addressed This Visit      Respiratory   COPD, severe ([place])    Exacerbated by environmental exposures cleaning out old shed building. Will tx with prednisone and continued inhaler and allergy regimen. F/u if not improving      Relevant Medications   predniSONE (DELTASONE) 20 MG tablet     Digestive   GERD (gastroesophageal reflux disease)    Tx with protonix, carafate prn, bland diet.       Relevant Medications   pantoprazole (PROTONIX) 40 MG tablet    Other Visit Diagnoses    Rash    -  Primary   Suspect yeast rash, will tx with nystatin cream, keep area clean and dry       Follow up plan: Return for as scheduled.

## 2019-01-04 DIAGNOSIS — R21 Rash and other nonspecific skin eruption: Secondary | ICD-10-CM | POA: Diagnosis not present

## 2019-01-04 DIAGNOSIS — L300 Nummular dermatitis: Secondary | ICD-10-CM | POA: Diagnosis not present

## 2019-01-05 NOTE — Assessment & Plan Note (Signed)
Tx with protonix, carafate prn, bland diet.

## 2019-01-05 NOTE — Assessment & Plan Note (Signed)
Exacerbated by environmental exposures cleaning out old shed building. Will tx with prednisone and continued inhaler and allergy regimen. F/u if not improving

## 2019-01-08 ENCOUNTER — Encounter: Payer: Self-pay | Admitting: Family Medicine

## 2019-01-21 ENCOUNTER — Other Ambulatory Visit: Payer: Self-pay

## 2019-01-21 DIAGNOSIS — Z20828 Contact with and (suspected) exposure to other viral communicable diseases: Secondary | ICD-10-CM | POA: Diagnosis not present

## 2019-01-21 DIAGNOSIS — Z20822 Contact with and (suspected) exposure to covid-19: Secondary | ICD-10-CM

## 2019-01-21 NOTE — Telephone Encounter (Signed)
Scheduled pt for virtual tomorrow 01/22/2019

## 2019-01-22 ENCOUNTER — Other Ambulatory Visit: Payer: Self-pay

## 2019-01-22 ENCOUNTER — Ambulatory Visit (INDEPENDENT_AMBULATORY_CARE_PROVIDER_SITE_OTHER): Payer: Medicare Other | Admitting: Family Medicine

## 2019-01-22 ENCOUNTER — Encounter: Payer: Self-pay | Admitting: Family Medicine

## 2019-01-22 DIAGNOSIS — J069 Acute upper respiratory infection, unspecified: Secondary | ICD-10-CM | POA: Diagnosis not present

## 2019-01-22 DIAGNOSIS — I2510 Atherosclerotic heart disease of native coronary artery without angina pectoris: Secondary | ICD-10-CM | POA: Diagnosis not present

## 2019-01-22 MED ORDER — DOXYCYCLINE HYCLATE 100 MG PO TABS: 100.0000 mg | ORAL_TABLET | Freq: Two times a day (BID) | ORAL | 0 refills | Status: DC

## 2019-01-22 NOTE — Progress Notes (Signed)
LMP  (LMP Unknown)    Subjective:    Patient ID: Kristi Clark, female    DOB: Jul 05, 1950, 68 y.o.   MRN: [id number]  HPI: Kristi Clark is a 68 y.o. female  Chief Complaint  Patient presents with  . URI    pt states she has been having congestion, pressure, fatigue, and aches since Friday. Highest temp was 100.5, took COVID test yesterday, no result yet     . This visit was completed via WebEx due to the restrictions of the COVID-19 pandemic. All issues as above were discussed and addressed. Physical exam was done as above through visual confirmation on WebEx. If it was felt that the patient should be evaluated in the office, they were directed there. The patient verbally consented to this visit. . Location of the patient: home . Location of the provider: home . Those involved with this call:  . Provider: Merrie Roof, PA-C . CMA: Yvonna Alanis, [place] . Front Desk/Registration: Jill Side  . Time spent on call: 15 minutes with patient face to face via video conference. More than 50% of this time was spent in counseling and coordination of care. 5 minutes total spent in review of patient's record and preparation of their chart. I verified patient identity using two factors (patient name and date of birth). Patient consents verbally to being seen via telemedicine visit today.   Traveled to [place] recently which usually causes sinus infection. Burning in throat and chest, congestion, sinus pressure behind both eyes, fatigue, fever of 100.5 the past 4-5 days. Was COVID tested yesterday, results pending. Denies CP, SOB, N/V/D, loss of taste or smell, known sick contacts. Taking typical allergy and COPD regimen which she states is seeming to help some.   Relevant past medical, surgical, family and social history reviewed and updated as indicated. Interim medical history since our last visit reviewed. Allergies and medications reviewed and updated.  Review of Systems  Per HPI  unless specifically indicated above     Objective:    LMP  (LMP Unknown)   Wt Readings from Last 3 Encounters:  01/02/19 127 lb 6 oz (57.8 kg)  11/15/18 125 lb (56.7 kg)  11/15/18 125 lb 9.6 oz (57 kg)    Physical Exam Vitals signs and nursing note reviewed.  Constitutional:      General: She is not in acute distress.    Appearance: Normal appearance.  HENT:     Head: Atraumatic.     Right Ear: External ear normal.     Left Ear: External ear normal.     Nose: Congestion present.     Mouth/Throat:     Mouth: Mucous membranes are moist.     Pharynx: Oropharynx is clear. Posterior oropharyngeal erythema present.  Eyes:     Extraocular Movements: Extraocular movements intact.     Conjunctiva/sclera: Conjunctivae normal.  Neck:     Musculoskeletal: Normal range of motion.  Cardiovascular:     Comments: Unable to assess via virtual visit Pulmonary:     Effort: Pulmonary effort is normal. No respiratory distress.  Musculoskeletal: Normal range of motion.  Skin:    General: Skin is dry.     Findings: No erythema.  Neurological:     Mental Status: She is alert and oriented to person, place, and time.  Psychiatric:        Mood and Affect: Mood normal.        Thought Content: Thought content normal.  Judgment: Judgment normal.     Results for orders placed or performed in visit on 01/21/19  Novel Coronavirus, NAA ([facility])   Specimen: Nasopharyngeal(NP) swabs in vial transport medium   NASOPHARYNGE  TESTING  Result Value Ref Range   SARS-CoV-2, NAA Not Detected Not Detected      Assessment & Plan:   Problem List Items Addressed This Visit    None    Visit Diagnoses    Upper respiratory tract infection, unspecified type    -  Primary   Allergic vs viral, awaiting COVID results. Tx with allergy and inhaler regimen, zpak, mucinex. May require prednisone if not resolving       Follow up plan: Return if symptoms worsen or fail to improve.

## 2019-01-23 LAB — NOVEL CORONAVIRUS, NAA: SARS-CoV-2, NAA: NOT DETECTED

## 2019-01-25 ENCOUNTER — Encounter: Payer: Self-pay | Admitting: Family Medicine

## 2019-01-25 DIAGNOSIS — R0602 Shortness of breath: Secondary | ICD-10-CM | POA: Diagnosis not present

## 2019-01-25 DIAGNOSIS — I10 Essential (primary) hypertension: Secondary | ICD-10-CM | POA: Diagnosis not present

## 2019-01-25 DIAGNOSIS — J449 Chronic obstructive pulmonary disease, unspecified: Secondary | ICD-10-CM | POA: Diagnosis not present

## 2019-01-25 DIAGNOSIS — Z9861 Coronary angioplasty status: Secondary | ICD-10-CM | POA: Diagnosis not present

## 2019-01-25 DIAGNOSIS — E782 Mixed hyperlipidemia: Secondary | ICD-10-CM | POA: Diagnosis not present

## 2019-01-25 DIAGNOSIS — I2510 Atherosclerotic heart disease of native coronary artery without angina pectoris: Secondary | ICD-10-CM | POA: Diagnosis not present

## 2019-02-01 DIAGNOSIS — R0602 Shortness of breath: Secondary | ICD-10-CM | POA: Diagnosis not present

## 2019-02-26 ENCOUNTER — Encounter: Payer: Self-pay | Admitting: Family Medicine

## 2019-02-27 ENCOUNTER — Other Ambulatory Visit: Payer: Self-pay | Admitting: Family Medicine

## 2019-02-27 MED ORDER — ATORVASTATIN CALCIUM 40 MG PO TABS: 40.0000 mg | ORAL_TABLET | Freq: Every day | ORAL | 1 refills | Status: DC

## 2019-03-17 ENCOUNTER — Encounter: Payer: Self-pay | Admitting: Family Medicine

## 2019-03-18 ENCOUNTER — Other Ambulatory Visit: Payer: Self-pay | Admitting: Family Medicine

## 2019-03-18 MED ORDER — ALBUTEROL SULFATE HFA 108 (90 BASE) MCG/ACT IN AERS: 2.0000 | INHALATION_SPRAY | Freq: Four times a day (QID) | RESPIRATORY_TRACT | 5 refills | Status: DC | PRN

## 2019-03-18 MED ORDER — FLUTICASONE PROPIONATE 50 MCG/ACT NA SUSP: 2.0000 | Freq: Every day | NASAL | 3 refills | Status: DC

## 2019-03-28 ENCOUNTER — Encounter: Payer: Self-pay | Admitting: Family Medicine

## 2019-03-28 ENCOUNTER — Ambulatory Visit (INDEPENDENT_AMBULATORY_CARE_PROVIDER_SITE_OTHER): Payer: Medicare Other | Admitting: Family Medicine

## 2019-03-28 ENCOUNTER — Other Ambulatory Visit: Payer: Self-pay

## 2019-03-28 VITALS — BP 127/53 | HR 101 | Temp 101.4°F | Ht 59.0 in | Wt 127.0 lb

## 2019-03-28 DIAGNOSIS — Z20822 Contact with and (suspected) exposure to covid-19: Secondary | ICD-10-CM | POA: Diagnosis not present

## 2019-03-28 DIAGNOSIS — R509 Fever, unspecified: Secondary | ICD-10-CM | POA: Diagnosis not present

## 2019-03-28 MED ORDER — DOXYCYCLINE HYCLATE 100 MG PO TABS: 100.0000 mg | ORAL_TABLET | Freq: Two times a day (BID) | ORAL | 0 refills | Status: DC

## 2019-03-28 NOTE — Progress Notes (Signed)
BP (!) 127/53   Pulse (!) 101   Temp (!) 101.4 F (38.6 C) (Oral)   Ht 4\' 11"  (1.499 m)   Wt 127 lb (57.6 kg)   LMP  (LMP Unknown)   BMI 25.65 kg/m    Subjective:    Patient ID: Kristi Clark, female    DOB: [birth year], 69 y.o.   MRN: [id number]  HPI: Kristi Clark is a 69 y.o. female  Chief Complaint  Patient presents with  . Chills    symptoms started yesterday  . Cough    chest congestion   . Fever  . Headache    . This visit was completed via WebEx due to the restrictions of the COVID-19 pandemic. All issues as above were discussed and addressed. Physical exam was done as above through visual confirmation on WebEx. If it was felt that the patient should be evaluated in the office, they were directed there. The patient verbally consented to this visit. . Location of the patient: home . Location of the provider: work . Those involved with this call:  . Provider: Merrie Roof, PA-C . CMA: Lesle Chris, [place] . Front Desk/Registration: Jill Side  . Time spent on call: 15 minutes with patient face to face via video conference. More than 50% of this time was spent in counseling and coordination of care. 5 minutes total spent in review of patient's record and preparation of their chart. I verified patient identity using two factors (patient name and date of birth). Patient consents verbally to being seen via telemedicine visit today.   Patient presenting today for 1 day of chills, productive cough, fever, HA, wheezing, fatigue. Husband has been sick with similar sxs the past week but neither have been tested for COVID. Denies SOB, N/V/D, CP. Taking her typical allergy and inhaler regimen with mild benefit.   Relevant past medical, surgical, family and social history reviewed and updated as indicated. Interim medical history since our last visit reviewed. Allergies and medications reviewed and updated.  Review of Systems  Per HPI unless specifically indicated  above     Objective:    BP (!) 127/53   Pulse (!) 101   Temp (!) 101.4 F (38.6 C) (Oral)   Ht 4\' 11"  (1.499 m)   Wt 127 lb (57.6 kg)   LMP  (LMP Unknown)   BMI 25.65 kg/m   Wt Readings from Last 3 Encounters:  03/28/19 127 lb (57.6 kg)  01/02/19 127 lb 6 oz (57.8 kg)  11/15/18 125 lb (56.7 kg)    Physical Exam Vitals and nursing note reviewed.  Constitutional:      General: She is not in acute distress.    Appearance: Normal appearance.  HENT:     Head: Atraumatic.     Right Ear: External ear normal.     Left Ear: External ear normal.     Nose: Nose normal. No congestion.     Mouth/Throat:     Mouth: Mucous membranes are moist.     Pharynx: Oropharynx is clear. Posterior oropharyngeal erythema present.  Eyes:     Extraocular Movements: Extraocular movements intact.     Conjunctiva/sclera: Conjunctivae normal.  Cardiovascular:     Comments: Unable to assess via virtual visit Pulmonary:     Effort: Pulmonary effort is normal. No respiratory distress.  Musculoskeletal:        General: Normal range of motion.     Cervical back: Normal range of motion.  Skin:  General: Skin is dry.     Findings: No erythema.  Neurological:     Mental Status: She is alert and oriented to person, place, and time.  Psychiatric:        Mood and Affect: Mood normal.        Thought Content: Thought content normal.        Judgment: Judgment normal.     Results for orders placed or performed in visit on 01/21/19  Novel Coronavirus, NAA ([facility])   Specimen: Nasopharyngeal(NP) swabs in vial transport medium   NASOPHARYNGE  TESTING  Result Value Ref Range   SARS-CoV-2, NAA Not Detected Not Detected      Assessment & Plan:   Problem List Items Addressed This Visit    None    Visit Diagnoses    Suspected COVID-19 virus infection    -  Primary   COVID testing ordered, isolation reviewed. Start doxycycline due to severe COPD prone to pneumonia. Supportive care and strict return  precautions reviewed   Relevant Orders   MyChart COVID-19 home monitoring program   Temperature monitoring   Fever, unspecified fever cause       Relevant Orders   Novel Coronavirus, NAA ([facility])       Follow up plan: Return if symptoms worsen or fail to improve.

## 2019-03-29 ENCOUNTER — Ambulatory Visit: Payer: Medicare Other | Attending: Family

## 2019-03-29 DIAGNOSIS — Z20822 Contact with and (suspected) exposure to covid-19: Secondary | ICD-10-CM

## 2019-03-30 ENCOUNTER — Encounter (INDEPENDENT_AMBULATORY_CARE_PROVIDER_SITE_OTHER): Payer: Self-pay

## 2019-03-30 LAB — NOVEL CORONAVIRUS, NAA: SARS-CoV-2, NAA: NOT DETECTED

## 2019-03-31 ENCOUNTER — Encounter (INDEPENDENT_AMBULATORY_CARE_PROVIDER_SITE_OTHER): Payer: Self-pay

## 2019-04-04 DIAGNOSIS — R05 Cough: Secondary | ICD-10-CM | POA: Diagnosis not present

## 2019-04-04 DIAGNOSIS — F17211 Nicotine dependence, cigarettes, in remission: Secondary | ICD-10-CM | POA: Diagnosis not present

## 2019-04-04 DIAGNOSIS — R0600 Dyspnea, unspecified: Secondary | ICD-10-CM | POA: Diagnosis not present

## 2019-04-04 DIAGNOSIS — J479 Bronchiectasis, uncomplicated: Secondary | ICD-10-CM | POA: Diagnosis not present

## 2019-04-04 DIAGNOSIS — J432 Centrilobular emphysema: Secondary | ICD-10-CM | POA: Diagnosis not present

## 2019-04-05 ENCOUNTER — Encounter: Payer: Self-pay | Admitting: Family Medicine

## 2019-04-06 ENCOUNTER — Encounter (INDEPENDENT_AMBULATORY_CARE_PROVIDER_SITE_OTHER): Payer: Self-pay

## 2019-04-08 ENCOUNTER — Encounter (INDEPENDENT_AMBULATORY_CARE_PROVIDER_SITE_OTHER): Payer: Self-pay

## 2019-04-08 ENCOUNTER — Other Ambulatory Visit: Payer: Self-pay | Admitting: Family Medicine

## 2019-04-08 MED ORDER — FLUCONAZOLE 150 MG PO TABS: 150.0000 mg | ORAL_TABLET | Freq: Once | ORAL | 0 refills | Status: AC

## 2019-04-17 ENCOUNTER — Encounter: Payer: Self-pay | Admitting: Family Medicine

## 2019-04-19 DIAGNOSIS — J189 Pneumonia, unspecified organism: Secondary | ICD-10-CM | POA: Diagnosis not present

## 2019-04-26 DIAGNOSIS — Z9861 Coronary angioplasty status: Secondary | ICD-10-CM | POA: Diagnosis not present

## 2019-04-26 DIAGNOSIS — J449 Chronic obstructive pulmonary disease, unspecified: Secondary | ICD-10-CM | POA: Diagnosis not present

## 2019-04-26 DIAGNOSIS — I10 Essential (primary) hypertension: Secondary | ICD-10-CM | POA: Diagnosis not present

## 2019-04-26 DIAGNOSIS — F17210 Nicotine dependence, cigarettes, uncomplicated: Secondary | ICD-10-CM | POA: Diagnosis not present

## 2019-04-26 DIAGNOSIS — I2510 Atherosclerotic heart disease of native coronary artery without angina pectoris: Secondary | ICD-10-CM | POA: Diagnosis not present

## 2019-04-26 DIAGNOSIS — E782 Mixed hyperlipidemia: Secondary | ICD-10-CM | POA: Diagnosis not present

## 2019-05-01 ENCOUNTER — Encounter: Payer: Self-pay | Admitting: Family Medicine

## 2019-05-02 ENCOUNTER — Other Ambulatory Visit: Payer: Self-pay

## 2019-05-02 NOTE — Telephone Encounter (Signed)
Patient's last routine visit was 11/15/18 and has a f/up 05/16/19.

## 2019-05-03 MED ORDER — HYDROCHLOROTHIAZIDE 25 MG PO TABS: 25.0000 mg | ORAL_TABLET | Freq: Every day | ORAL | 1 refills | Status: DC

## 2019-05-03 MED ORDER — CLOPIDOGREL BISULFATE 75 MG PO TABS: 75.0000 mg | ORAL_TABLET | Freq: Every day | ORAL | 1 refills | Status: DC

## 2019-05-03 MED ORDER — LOSARTAN POTASSIUM 100 MG PO TABS: 100.0000 mg | ORAL_TABLET | Freq: Every day | ORAL | 1 refills | Status: DC

## 2019-05-16 ENCOUNTER — Encounter: Payer: Self-pay | Admitting: Family Medicine

## 2019-05-16 ENCOUNTER — Other Ambulatory Visit: Payer: Self-pay

## 2019-05-16 ENCOUNTER — Ambulatory Visit (INDEPENDENT_AMBULATORY_CARE_PROVIDER_SITE_OTHER): Payer: Medicare Other | Admitting: Family Medicine

## 2019-05-16 ENCOUNTER — Ambulatory Visit (INDEPENDENT_AMBULATORY_CARE_PROVIDER_SITE_OTHER): Payer: Medicare Other

## 2019-05-16 VITALS — BP 119/69 | HR 85 | Temp 98.9°F | Ht 59.0 in | Wt 122.0 lb

## 2019-05-16 DIAGNOSIS — I2510 Atherosclerotic heart disease of native coronary artery without angina pectoris: Secondary | ICD-10-CM

## 2019-05-16 DIAGNOSIS — I129 Hypertensive chronic kidney disease with stage 1 through stage 4 chronic kidney disease, or unspecified chronic kidney disease: Secondary | ICD-10-CM

## 2019-05-16 DIAGNOSIS — K219 Gastro-esophageal reflux disease without esophagitis: Secondary | ICD-10-CM

## 2019-05-16 DIAGNOSIS — Z0000 Encounter for general adult medical examination without abnormal findings: Secondary | ICD-10-CM | POA: Diagnosis not present

## 2019-05-16 DIAGNOSIS — M549 Dorsalgia, unspecified: Secondary | ICD-10-CM

## 2019-05-16 DIAGNOSIS — J449 Chronic obstructive pulmonary disease, unspecified: Secondary | ICD-10-CM

## 2019-05-16 DIAGNOSIS — Z720 Tobacco use: Secondary | ICD-10-CM

## 2019-05-16 DIAGNOSIS — E785 Hyperlipidemia, unspecified: Secondary | ICD-10-CM | POA: Diagnosis not present

## 2019-05-16 LAB — UA/M W/RFLX CULTURE, ROUTINE
Bilirubin, UA: NEGATIVE
Glucose, UA: NEGATIVE
Ketones, UA: NEGATIVE
Leukocytes,UA: NEGATIVE
Nitrite, UA: NEGATIVE
Protein,UA: NEGATIVE
RBC, UA: NEGATIVE
Specific Gravity, UA: 1.02 (ref 1.005–1.030)
Urobilinogen, Ur: 0.2 mg/dL (ref 0.2–1.0)
pH, UA: 6 (ref 5.0–7.5)

## 2019-05-16 NOTE — Progress Notes (Signed)
BP 119/69   Pulse 85   Temp 98.9 F (37.2 C) (Oral)   Ht 4\' 11"  (1.499 m)   Wt 122 lb (55.3 kg)   LMP  (LMP Unknown)   SpO2 96%   BMI 24.64 kg/m    Subjective:    Patient ID: Kristi Clark, female    DOB: Aug 04, 1950, 69 y.o.   MRN: [id number]  HPI: Kristi Clark is a 69 y.o. female  Chief Complaint  Patient presents with  . Hypertension  . Hyperlipidemia  . Back Pain    lower back last week, pt wants to check her urine   Presenting today for 6 month f/u chronic conditions.   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 Feels it's doing better now, a bit stiff in the morning but seems to work itself out throughout the day. Denies leg weakness, radiation of pain, numbness or tingling of extremities. Not trying anything for relief.   HTN - home BPs running 120/80 range at home. Taking medicines faithfully without side effects. Denies CP, SOB, HAs, dizziness.   HLD, CAD- on lipitor, tolerating well. Denies claudication, myalgias. Followed by Cardiology as well.   COPD - Followed by Pulmonology, getting annual chest CT scans for lung cancer screening. Inhaler regimen doing well, no recent exacerbations.   Started patches trying to quit smoking, but got a bruise from it so stopped doing it. Does not feel ready to quit altogether.   Relevant past medical, surgical, family and social history reviewed and updated as indicated. Interim medical history since our last visit reviewed. Allergies and medications reviewed and updated.  Review of Systems  Per HPI unless specifically indicated above     Objective:    BP 119/69   Pulse 85   Temp 98.9 F (37.2 C) (Oral)   Ht 4\' 11"  (1.499 m)   Wt 122 lb (55.3 kg)   LMP  (LMP Unknown)   SpO2 96%   BMI 24.64 kg/m   Wt Readings from Last 3 Encounters:  05/16/19 122 lb (55.3 kg)  05/16/19 122  lb (55.3 kg)  03/28/19 127 lb (57.6 kg)    Physical Exam Vitals and nursing note reviewed.  Constitutional:      Appearance: Normal appearance. She is not ill-appearing.  HENT:     Head: Atraumatic.  Eyes:     Extraocular Movements: Extraocular movements intact.     Conjunctiva/sclera: Conjunctivae normal.  Cardiovascular:     Rate and Rhythm: Normal rate and regular rhythm.     Heart sounds: Normal heart sounds.  Pulmonary:     Effort: Pulmonary effort is normal.     Comments: Decreased breath sounds throughout Musculoskeletal:        General: Normal range of motion.     Cervical back: Normal range of motion and neck supple.  Skin:    General: Skin is warm and dry.  Neurological:     Mental Status: She is alert and oriented to person, place, and time.  Psychiatric:        Mood and Affect: Mood normal.        Thought Content: Thought content normal.        Judgment: Judgment normal.     Results for orders placed or performed in visit on 05/16/19  UA/M w/rflx Culture, Routine   Specimen: Urine  URINE  Result Value Ref Range   Specific Gravity, UA 1.020 1.005 - 1.030   pH, UA 6.0 5.0 - 7.5   Color, UA Yellow Yellow   Appearance Ur Clear Clear   Leukocytes,UA Negative Negative   Protein,UA Negative Negative/Trace   Glucose, UA Negative Negative   Ketones, UA Negative Negative   RBC, UA Negative Negative   Bilirubin, UA Negative Negative   Urobilinogen, Ur 0.2 0.2 - 1.0 mg/dL   Nitrite, UA Negative Negative  Comprehensive metabolic panel  Result Value Ref Range   Glucose 91 65 - 99 mg/dL   BUN 15 8 - 27 mg/dL   Creatinine, Ser 0.89 0.57 - 1.00 mg/dL   GFR calc non Af Amer 66 >59 mL/min/1.73   GFR calc Af Amer 76 >59 mL/min/1.73   BUN/Creatinine Ratio 17 12 - 28   Sodium 139 134 - 144 mmol/L   Potassium 4.3 3.5 - 5.2 mmol/L   Chloride 99 96 - 106 mmol/L   CO2 23 20 - 29 mmol/L   Calcium 9.7 8.7 - 10.3 mg/dL   Total Protein 6.8 6.0 - 8.5 g/dL   Albumin 4.3  3.8 - 4.8 g/dL   Globulin, Total 2.5 1.5 - 4.5 g/dL   Albumin/Globulin Ratio 1.7 1.2 - 2.2   Bilirubin Total <0.2 0.0 - 1.2 mg/dL   Alkaline Phosphatase 91 39 - 117 IU/L   AST 28 0 - 40 IU/L   ALT 16 0 - 32 IU/L  Lipid Panel w/o Chol/HDL Ratio  Result Value Ref Range   Cholesterol, Total 134 100 - 199 mg/dL   Triglycerides 96 0 - 149 mg/dL   HDL 58 >39 mg/dL   VLDL Cholesterol Cal 18 5 - 40 mg/dL   LDL Chol Calc (NIH) 58 0 - 99 mg/dL      Assessment & Plan:   Problem List Items Addressed This Visit      Cardiovascular and Mediastinum   Benign hypertension with chronic kidney disease    BPs stable and WNL, continue current regimen      Relevant Orders   Comprehensive metabolic panel (Completed)   CAD (coronary artery disease)    Recheck lipids, adjust as needed. Continue per Cardiology recommendations      Relevant Orders   Lipid Panel w/o Chol/HDL Ratio (Completed)     Respiratory   COPD, severe ([place])    Followed by Pulmonology, continue per their recommendations        Digestive   GERD (gastroesophageal reflux disease)    Stable and well controlled, continue current regimen        Other   Hyperlipidemia    Recheck lipids, adjust as needed      Tobacco abuse    Patches caused bruising, does not feel ready to quit just yet       Other Visit Diagnoses    Back pain, unspecified back location, unspecified back pain laterality, unspecified chronicity    -  Primary   Improving over time, discussed heating pads, massage, stretches, tylenol prn   Relevant Orders   UA/M w/rflx Culture, Routine (Completed)       Follow up plan: Return in about 6 months (around 11/16/2019) for 6 month f/u.

## 2019-05-16 NOTE — Progress Notes (Signed)
Subjective:   Kristi Clark is a 69 y.o. female who presents for Medicare Annual (Subsequent) preventive examination.    Review of Systems:  Cardiac Risk Factors include: advanced age (>22men, >23 women);female gender;dyslipidemia;hypertension     Objective:     Vitals: BP 119/69   Pulse 85   Temp 98.9 F (37.2 C)   Ht 4\' 11"  (1.499 m)   Wt 122 lb (55.3 kg)   LMP  (LMP Unknown)   BMI 24.64 kg/m   Body mass index is 24.64 kg/m.  Advanced Directives 05/16/2019 11/15/2018 01/16/2018 10/11/2017 10/03/2016 09/15/2015 07/14/2014  Does Patient Have a Medical Advance Directive? No No No No Yes No No  Type of Advance Directive - - - - [place] - -  Does patient want to make changes to medical advance directive? - - - Yes (MAU/Ambulatory/Procedural Areas - Information given) - - -  Would patient like information on creating a medical advance directive? Yes (MAU/Ambulatory/Procedural Areas - Information given) - - - - - No - patient declined information    Tobacco Social History   Tobacco Use  Smoking Status Current Every Day Smoker  . Packs/day: 1.50  . Years: 56.00  . Pack years: 84.00  . Types: Cigarettes  Smokeless Tobacco Never Used  Tobacco Comment   has the gum and patch.      Ready to quit: Not Answered Counseling given: Not Answered Comment: has the gum and patch.    Clinical Intake:  Pre-visit preparation completed: Yes  Pain : 0-10 Pain Score: 1  Pain Type: Acute pain Pain Location: Back Pain Orientation: Lower, Left, Right Pain Descriptors / Indicators: Aching Pain Onset: 1 to 4 weeks ago Pain Frequency: Occasional     Nutritional Status: BMI of 19-24  Normal Nutritional Risks: None Diabetes: No  How often do you need to have someone help you when you read instructions, pamphlets, or other written materials from your doctor or pharmacy?: 1 - Never  Interpreter Needed?: No  Information entered by :: Lorenda Grecco,LPN  Past  Medical History:  Diagnosis Date  . COPD (chronic obstructive pulmonary disease) ([facility])   . Depression   . GERD (gastroesophageal reflux disease)   . Hyperlipidemia   . Hypertension   . Menopause   . Osteopenia   . Tobacco abuse    Past Surgical History:  Procedure Laterality Date  . ABDOMINAL HYSTERECTOMY    . CARDIAC CATHETERIZATION    . CHOLECYSTECTOMY  2014  . COLONOSCOPY WITH PROPOFOL N/A 01/16/2018   Procedure: COLONOSCOPY WITH PROPOFOL;  Surgeon: Lollie Sails, MD;  Location: [facility] ENDOSCOPY;  Service: Endoscopy;  Laterality: N/A;  . ESOPHAGOGASTRODUODENOSCOPY (EGD) WITH PROPOFOL N/A 01/16/2018   Procedure: ESOPHAGOGASTRODUODENOSCOPY (EGD) WITH PROPOFOL;  Surgeon: Lollie Sails, MD;  Location: [facility] ENDOSCOPY;  Service: Endoscopy;  Laterality: N/A;  . heart stent     Family History  Problem Relation Age of Onset  . Cancer Mother   . Stroke Mother   . Heart disease Father   . Hyperlipidemia Father   . Cancer Sister   . Diabetes Sister   . Breast cancer Sister 67  . Diabetes Brother   . Asthma Son   . Cancer Son   . Diabetes Daughter   . Hypertension Daughter   . Cancer Maternal Grandmother        gallbladder  . Diabetes Brother   . Heart disease Brother   . Breast cancer Paternal Aunt    Social History  Socioeconomic History  . Marital status: Married    Spouse name: Not on file  . Number of children: Not on file  . Years of education: Not on file  . Highest education level: 10th grade  Occupational History  . Not on file  Tobacco Use  . Smoking status: Current Every Day Smoker    Packs/day: 1.50    Years: 56.00    Pack years: 84.00    Types: Cigarettes  . Smokeless tobacco: Never Used  . Tobacco comment: has the gum and patch.   Substance and Sexual Activity  . Alcohol use: No  . Drug use: No  . Sexual activity: Never  Other Topics Concern  . Not on file  Social History Narrative  . Not on file   Social Determinants of Health    Financial Resource Strain:   . Difficulty of Paying Living Expenses:   Food Insecurity:   . Worried About Charity fundraiser in the Last Year:   . Arboriculturist in the Last Year:   Transportation Needs:   . Film/video editor (Medical):   Marland Kitchen Lack of Transportation (Non-Medical):   Physical Activity:   . Days of Exercise per Week:   . Minutes of Exercise per Session:   Stress:   . Feeling of Stress :   Social Connections:   . Frequency of Communication with Friends and Family:   . Frequency of Social Gatherings with Friends and Family:   . Attends Religious Services:   . Active Member of Clubs or Organizations:   . Attends Archivist Meetings:   Marland Kitchen Marital Status:     Outpatient Encounter Medications as of 05/16/2019  Medication Sig  . albuterol (VENTOLIN HFA) 108 (90 Base) MCG/ACT inhaler Inhale 2 puffs into the lungs every 6 (six) hours as needed for wheezing or shortness of breath.  Marland Kitchen aspirin 81 MG tablet Take 81 mg by mouth daily.  Marland Kitchen atorvastatin (LIPITOR) 40 MG tablet Take 1 tablet (40 mg total) by mouth daily.  . Cetirizine HCl (ZYRTEC PO) Take by mouth daily.  . clopidogrel (PLAVIX) 75 MG tablet Take 1 tablet (75 mg total) by mouth daily.  . fluticasone (FLONASE) 50 MCG/ACT nasal spray Place 2 sprays into both nostrils daily.  . Fluticasone-Salmeterol (ADVAIR) 250-50 MCG/DOSE AEPB Inhale 1 puff into the lungs 2 (two) times daily.  . hydrochlorothiazide (HYDRODIURIL) 25 MG tablet Take 1 tablet (25 mg total) by mouth daily.  . isosorbide dinitrate (ISORDIL) 30 MG tablet Take 1 tablet (30 mg total) by mouth daily.  Marland Kitchen losartan (COZAAR) 100 MG tablet Take 1 tablet (100 mg total) by mouth daily.  . metoprolol succinate (TOPROL-XL) 50 MG 24 hr tablet Take 1 tablet (50 mg total) by mouth daily. Take with or immediately following a meal.  . Multiple Vitamins-Minerals (CENTRUM SILVER ADULT 50+ PO) Take 1 tablet by mouth daily.  . pantoprazole (PROTONIX) 40 MG tablet  Take 1 tablet (40 mg total) by mouth daily.  . Probiotic Product (PROBIOTIC-10 PO) Take by mouth. Every other day  . tiotropium (SPIRIVA) 18 MCG inhalation capsule Place 1 capsule (18 mcg total) into inhaler and inhale daily.   No facility-administered encounter medications on file as of 05/16/2019.    Activities of Daily Living In your present state of health, do you have any difficulty performing the following activities: 05/16/2019 11/15/2018  Hearing? Y N  Comment no hearing aids no hearing aids  Vision? N N  Comment eyeglasses,  [facility] ey[facility]lasses, [facility]  Difficulty concentrating or making decisions? N N  Walking or climbing stairs? N N  Dressing or bathing? N N  Doing errands, shopping? N N  Preparing Food and eating ? N N  Using the Toilet? N N  In the past six months, have you accidently leaked urine? N N  Do you have problems with loss of bowel control? N N  Managing your Medications? N N  Managing your Finances? N N  Housekeeping or managing your Housekeeping? N N  Some recent data might be hidden    Patient Care Team: Volney American, PA-C as PCP - General (Family Medicine) Allyne Gee, MD as Consulting Physician (Cardiology) Erby Pian, MD as Referring Physician (Pulmonary Disease) Alfonso Patten, MD as Consulting Physician (Dermatology)    Assessment:   This is a routine wellness examination for [place].  Exercise Activities and Dietary recommendations Current Exercise Habits: The patient does not participate in regular exercise at present, Exercise limited by: None identified  Goals Addressed   None     Fall Risk: Fall Risk  05/16/2019 11/15/2018 09/28/2018 10/11/2017 10/11/2017  Falls in the past year? 0 0 0 No No  Number falls in past yr: 0 - 0 - -  Injury with Fall? 0 - 0 - -    FALL RISK PREVENTION PERTAINING TO THE HOME:  Any stairs in or around the home? No  If so, are there any without handrails? No   Home  free of loose throw rugs in walkways, pet beds, electrical cords, etc? Yes  Adequate lighting in your home to reduce risk of falls? Yes   ASSISTIVE DEVICES UTILIZED TO PREVENT FALLS:  Life alert? No  Use of a cane, walker or w/c? No  Grab bars in the bathroom? Yes  Shower chair or bench in shower? Yes  Elevated toilet seat or a handicapped toilet? Yes   DME ORDERS:  DME order needed?  No   TIMED UP AND GO:  Was the test performed? Yes .  Length of time to ambulate 10 feet: 8 sec.   GAIT:  Appearance of gait: Gait steady and fast without the use of an assistive device.  Education: Fall risk prevention has been discussed.  Intervention(s) required? No   DME/home health order needed?  No    Depression Screen PHQ 2/9 Scores 05/16/2019 11/15/2018 09/28/2018 10/11/2017  PHQ - 2 Score 0 0 1 1  PHQ- 9 Score - - 3 4     Cognitive Function     6CIT Screen 05/16/2019 11/15/2018 10/11/2017  What Year? 0 points 0 points 0 points  What month? 0 points 0 points 0 points  What time? 0 points 0 points 0 points  Count back from 20 0 points 0 points 0 points  Months in reverse 0 points 0 points 0 points  Repeat phrase 0 points 0 points 0 points  Total Score 0 0 0    Immunization History  Administered Date(s) Administered  . Fluad Quad(high Dose 65+) 11/15/2018  . Influenza, High Dose Seasonal PF 01/04/2016, 12/23/2016, 12/25/2017  . Influenza,inj,Quad PF,6+ Mos 02/11/2015  . Influenza-Unspecified 12/13/2013  . PFIZER SARS-COV-2 Vaccination 05/01/2019  . Pneumococcal Conjugate-13 08/14/2013  . Pneumococcal Polysaccharide-23 03/04/2014  . Td 12/03/1994  . Zoster 02/07/2011    Qualifies for Shingles Vaccine? Yes  Zostavax completed 2012. Due for Shingrix. Education has been provided regarding the importance of this vaccine. Pt has been advised to  call insurance company to determine out of pocket expense. Advised may also receive vaccine at local pharmacy or Health Dept. Verbalized  acceptance and understanding.  Tdap: up to date .  Flu Vaccine: up to date .  Pneumococcal Vaccine: up to date .   Covid-19 Vaccine: completed first  vaccine.   Screening Tests Health Maintenance  Topic Date Due  . TETANUS/TDAP  06/21/2020  . MAMMOGRAM  11/15/2020  . COLONOSCOPY  01/17/2023  . INFLUENZA VACCINE  Completed  . DEXA SCAN  Completed  . Hepatitis C Screening  Completed  . PNA vac Low Risk Adult  Completed    Cancer Screenings:  Colorectal Screening: Completed 01/2018. Repeat every 5 years  Mammogram: Completed 11/16/2018. Repeat every year  Bone Density: Completed 2014.   Lung Cancer Screening: (Low Dose CT Chest recommended if Age 74-80 years, 30 pack-year currently smoking OR have quit w/in 15years.) does qualify.   10/11/2018    Additional Screening:  Hepatitis C Screening: does not qualify; Completed 2017  Vision Screening: Recommended annual ophthalmology exams for early detection of glaucoma and other disorders of the eye. Is the patient up to date with their annual eye exam?  Yes  Who is the provider or what is the name of the office in which the pt attends annual eye exams? [facility]   Dental Screening: Recommended annual dental exams for proper oral hygiene  Community Resource Referral:  CRR required this visit?  No       Plan:  I have personally reviewed and addressed the Medicare Annual Wellness questionnaire and have noted the following in the patient's chart:  A. Medical and social history B. Use of alcohol, tobacco or illicit drugs  C. Current medications and supplements D. Functional ability and status E.  Nutritional status F.  Physical activity G. Advance directives H. List of other physicians I.  Hospitalizations, surgeries, and ER visits in previous 12 months J.  [place] such as hearing and vision if needed, cognitive and depression L. Referrals and appointments   In addition, I have reviewed and discussed  with patient certain preventive protocols, quality metrics, and best practice recommendations. A written personalized care plan for preventive services as well as general preventive health recommendations were provided to patient.  Signed,    Bevelyn Ngo, LPN  [id number] Nurse Health Advisor   Nurse Notes: none

## 2019-05-16 NOTE — Patient Instructions (Signed)
Kristi Clark , Thank you for taking time to come for your Medicare Wellness Visit. I appreciate your ongoing commitment to your health goals. Please review the following plan we discussed and let me know if I can assist you in the future.   Screening recommendations/referrals: Colonoscopy: up to date  Mammogram: up to date  Bone Density: up to date  Recommended yearly ophthalmology/optometry visit for glaucoma screening and checkup Recommended yearly dental visit for hygiene and checkup  Vaccinations: Influenza vaccine: up to date Pneumococcal vaccine: up to date  Tdap vaccine: up to date  Shingles vaccine: shingrix eligible    Covid-19: completed first dose   Advanced directives: Advance directive discussed with you today. I have provided a copy for you to complete at home and have notarized. Once this is complete please bring a copy in to our office so we can scan it into your chart.  Conditions/risks identified: none   Next appointment: Follow up in one year for your annual wellness visit.    Preventive Care 61 Years and Older, Female Preventive care refers to lifestyle choices and visits with your health care provider that can promote health and wellness. What does preventive care include?  A yearly physical exam. This is also called an annual well check.  Dental exams once or twice a year.  Routine eye exams. Ask your health care provider how often you should have your eyes checked.  Personal lifestyle choices, including:  Daily care of your teeth and gums.  Regular physical activity.  Eating a healthy diet.  Avoiding tobacco and drug use.  Limiting alcohol use.  Practicing safe sex.  Taking low-dose aspirin every day.  Taking vitamin and mineral supplements as recommended by your health care provider. What happens during an annual well check? The services and screenings done by your health care provider during your annual well check will depend on your age,  overall health, lifestyle risk factors, and family history of disease. Counseling  Your health care provider may ask you questions about your:  Alcohol use.  Tobacco use.  Drug use.  Emotional well-being.  Home and relationship well-being.  Sexual activity.  Eating habits.  History of falls.  Memory and ability to understand (cognition).  Work and work Statistician.  Reproductive health. Screening  You may have the following tests or measurements:  Height, weight, and BMI.  Blood pressure.  Lipid and cholesterol levels. These may be checked every 5 years, or more frequently if you are over 3 years old.  Skin check.  Lung cancer screening. You may have this screening every year starting at age 56 if you have a 30-pack-year history of smoking and currently smoke or have quit within the past 15 years.  Fecal occult blood test (FOBT) of the stool. You may have this test every year starting at age 17.  Flexible sigmoidoscopy or colonoscopy. You may have a sigmoidoscopy every 5 years or a colonoscopy every 10 years starting at age 55.  Hepatitis C blood test.  Hepatitis B blood test.  Sexually transmitted disease (STD) testing.  Diabetes screening. This is done by checking your blood sugar (glucose) after you have not eaten for a while (fasting). You may have this done every 1-3 years.  Bone density scan. This is done to screen for osteoporosis. You may have this done starting at age 58.  Mammogram. This may be done every 1-2 years. Talk to your health care provider about how often you should have regular mammograms. Talk  with your health care provider about your test results, treatment options, and if necessary, the need for more tests. Vaccines  Your health care provider may recommend certain vaccines, such as:  Influenza vaccine. This is recommended every year.  Tetanus, diphtheria, and acellular pertussis (Tdap, Td) vaccine. You may need a Td booster every 10  years.  Zoster vaccine. You may need this after age 26.  Pneumococcal 13-valent conjugate (PCV13) vaccine. One dose is recommended after age 56.  Pneumococcal polysaccharide (PPSV23) vaccine. One dose is recommended after age 14. Talk to your health care provider about which screenings and vaccines you need and how often you need them. This information is not intended to replace advice given to you by your health care provider. Make sure you discuss any questions you have with your health care provider. Document Released: 03/20/2015 Document Revised: 11/11/2015 Document Reviewed: 12/23/2014 Elsevier Interactive Patient Education  2017 [place] Prevention in the Home Falls can cause injuries. They can happen to people of all ages. There are many things you can do to make your home safe and to help prevent falls. What can I do on the outside of my home?  Regularly fix the edges of walkways and driveways and fix any cracks.  Remove anything that might make you trip as you walk through a door, such as a raised step or threshold.  Trim any bushes or trees on the path to your home.  Use bright outdoor lighting.  Clear any walking paths of anything that might make someone trip, such as rocks or tools.  Regularly check to see if handrails are loose or broken. Make sure that both sides of any steps have handrails.  Any raised decks and porches should have guardrails on the edges.  Have any leaves, snow, or ice cleared regularly.  Use sand or salt on walking paths during winter.  Clean up any spills in your garage right away. This includes oil or grease spills. What can I do in the bathroom?  Use night lights.  Install grab bars by the toilet and in the tub and shower. Do not use towel bars as grab bars.  Use non-skid mats or decals in the tub or shower.  If you need to sit down in the shower, use a plastic, non-slip stool.  Keep the floor dry. Clean up any water that  spills on the floor as soon as it happens.  Remove soap buildup in the tub or shower regularly.  Attach bath mats securely with double-sided non-slip rug tape.  Do not have throw rugs and other things on the floor that can make you trip. What can I do in the bedroom?  Use night lights.  Make sure that you have a light by your bed that is easy to reach.  Do not use any sheets or blankets that are too big for your bed. They should not hang down onto the floor.  Have a firm chair that has side arms. You can use this for support while you get dressed.  Do not have throw rugs and other things on the floor that can make you trip. What can I do in the kitchen?  Clean up any spills right away.  Avoid walking on wet floors.  Keep items that you use a lot in easy-to-reach places.  If you need to reach something above you, use a strong step stool that has a grab bar.  Keep electrical cords out of the way.  Do  not use floor polish or wax that makes floors slippery. If you must use wax, use non-skid floor wax.  Do not have throw rugs and other things on the floor that can make you trip. What can I do with my stairs?  Do not leave any items on the stairs.  Make sure that there are handrails on both sides of the stairs and use them. Fix handrails that are broken or loose. Make sure that handrails are as long as the stairways.  Check any carpeting to make sure that it is firmly attached to the stairs. Fix any carpet that is loose or worn.  Avoid having throw rugs at the top or bottom of the stairs. If you do have throw rugs, attach them to the floor with carpet tape.  Make sure that you have a light switch at the top of the stairs and the bottom of the stairs. If you do not have them, ask someone to add them for you. What else can I do to help prevent falls?  Wear shoes that:  Do not have high heels.  Have rubber bottoms.  Are comfortable and fit you well.  Are closed at the  toe. Do not wear sandals.  If you use a stepladder:  Make sure that it is fully opened. Do not climb a closed stepladder.  Make sure that both sides of the stepladder are locked into place.  Ask someone to hold it for you, if possible.  Clearly mark and make sure that you can see:  Any grab bars or handrails.  First and last steps.  Where the edge of each step is.  Use tools that help you move around (mobility aids) if they are needed. These include:  Canes.  Walkers.  Scooters.  Crutches.  Turn on the lights when you go into a dark area. Replace any light bulbs as soon as they burn out.  Set up your furniture so you have a clear path. Avoid moving your furniture around.  If any of your floors are uneven, fix them.  If there are any pets around you, be aware of where they are.  Review your medicines with your doctor. Some medicines can make you feel dizzy. This can increase your chance of falling. Ask your doctor what other things that you can do to help prevent falls. This information is not intended to replace advice given to you by your health care provider. Make sure you discuss any questions you have with your health care provider. Document Released: 12/18/2008 Document Revised: 07/30/2015 Document Reviewed: 03/28/2014 Elsevier Interactive Patient Education  2017 [employer].

## 2019-05-16 NOTE — Patient Instructions (Signed)
Take a calcium and vitamin D supplement over the counter, at least 1000 units of each daily

## 2019-05-17 ENCOUNTER — Other Ambulatory Visit: Payer: Self-pay | Admitting: Family Medicine

## 2019-05-17 LAB — COMPREHENSIVE METABOLIC PANEL
ALT: 16 IU/L (ref 0–32)
AST: 28 IU/L (ref 0–40)
Albumin/Globulin Ratio: 1.7 (ref 1.2–2.2)
Albumin: 4.3 g/dL (ref 3.8–4.8)
Alkaline Phosphatase: 91 IU/L (ref 39–117)
BUN/Creatinine Ratio: 17 (ref 12–28)
BUN: 15 mg/dL (ref 8–27)
Bilirubin Total: <0.2 mg/dL (ref 0.0–1.2)
CO2: 23 mmol/L (ref 20–29)
Calcium: 9.7 mg/dL (ref 8.7–10.3)
Chloride: 99 mmol/L (ref 96–106)
Creatinine, Ser: 0.89 mg/dL (ref 0.57–1.00)
GFR calc Af Amer: 76 mL/min/{1.73_m2} (ref >59)
GFR calc non Af Amer: 66 mL/min/{1.73_m2} (ref >59)
Globulin, Total: 2.5 g/dL (ref 1.5–4.5)
Glucose: 91 mg/dL (ref 65–99)
Potassium: 4.3 mmol/L (ref 3.5–5.2)
Sodium: 139 mmol/L (ref 134–144)
Total Protein: 6.8 g/dL (ref 6.0–8.5)

## 2019-05-17 LAB — LIPID PANEL W/O CHOL/HDL RATIO
Cholesterol, Total: 134 mg/dL (ref 100–199)
HDL: 58 mg/dL (ref >39)
LDL Chol Calc (NIH): 58 mg/dL (ref 0–99)
Triglycerides: 96 mg/dL (ref 0–149)
VLDL Cholesterol Cal: 18 mg/dL (ref 5–40)

## 2019-05-20 MED ORDER — PANTOPRAZOLE SODIUM 40 MG PO TBEC: 40.0000 mg | DELAYED_RELEASE_TABLET | Freq: Every day | ORAL | 1 refills | Status: DC

## 2019-05-20 NOTE — Telephone Encounter (Signed)
Patient seen 05/16/19

## 2019-05-21 NOTE — Assessment & Plan Note (Signed)
Recheck lipids, adjust as needed 

## 2019-05-21 NOTE — Assessment & Plan Note (Signed)
Stable and well controlled, continue current regimen 

## 2019-05-21 NOTE — Assessment & Plan Note (Signed)
Recheck lipids, adjust as needed. Continue per Cardiology recommendations 

## 2019-05-21 NOTE — Assessment & Plan Note (Signed)
BPs stable and WNL, continue current regimen 

## 2019-05-21 NOTE — Assessment & Plan Note (Signed)
Patches caused bruising, does not feel ready to quit just yet

## 2019-05-21 NOTE — Assessment & Plan Note (Signed)
Followed by Pulmonology, continue per their recommendations

## 2019-05-22 ENCOUNTER — Encounter: Payer: Self-pay | Admitting: Family Medicine

## 2019-05-22 ENCOUNTER — Other Ambulatory Visit: Payer: Self-pay | Admitting: Family Medicine

## 2019-05-22 MED ORDER — METOPROLOL SUCCINATE ER 50 MG PO TB24: 50.0000 mg | ORAL_TABLET | Freq: Every day | ORAL | 1 refills | Status: DC

## 2019-05-22 MED ORDER — ISOSORBIDE DINITRATE 30 MG PO TABS: 30.0000 mg | ORAL_TABLET | Freq: Every day | ORAL | 1 refills | Status: DC

## 2019-06-10 ENCOUNTER — Telehealth: Payer: Self-pay | Admitting: Family Medicine

## 2019-06-10 NOTE — Telephone Encounter (Signed)
Form is in [employer] for signature.

## 2019-06-10 NOTE — Telephone Encounter (Signed)
Pt,is here stating she has paper work for medication authorization that needs to be faxed by our office and sighned by provider. Left in back office file to be signed.

## 2019-06-12 ENCOUNTER — Other Ambulatory Visit: Payer: Self-pay | Admitting: Family Medicine

## 2019-06-12 MED ORDER — ISOSORBIDE MONONITRATE ER 30 MG PO TB24: 30.0000 mg | ORAL_TABLET | Freq: Every day | ORAL | 1 refills | Status: DC

## 2019-06-12 NOTE — Telephone Encounter (Signed)
Paperwork faxed and patient notified

## 2019-06-19 ENCOUNTER — Encounter: Payer: Self-pay | Admitting: Family Medicine

## 2019-06-19 ENCOUNTER — Telehealth (INDEPENDENT_AMBULATORY_CARE_PROVIDER_SITE_OTHER): Payer: Medicare Other | Admitting: Family Medicine

## 2019-06-19 ENCOUNTER — Telehealth: Payer: Self-pay | Admitting: Family Medicine

## 2019-06-19 VITALS — BP 130/59 | HR 81 | Temp 98.2°F | Wt 124.0 lb

## 2019-06-19 DIAGNOSIS — I2510 Atherosclerotic heart disease of native coronary artery without angina pectoris: Secondary | ICD-10-CM

## 2019-06-19 DIAGNOSIS — J449 Chronic obstructive pulmonary disease, unspecified: Secondary | ICD-10-CM

## 2019-06-19 DIAGNOSIS — F4321 Adjustment disorder with depressed mood: Secondary | ICD-10-CM | POA: Diagnosis not present

## 2019-06-19 DIAGNOSIS — J3089 Other allergic rhinitis: Secondary | ICD-10-CM | POA: Diagnosis not present

## 2019-06-19 MED ORDER — DOXYCYCLINE HYCLATE 100 MG PO TABS: 100.0000 mg | ORAL_TABLET | Freq: Two times a day (BID) | ORAL | 0 refills | Status: DC

## 2019-06-19 MED ORDER — PREDNISONE 10 MG PO TABS: ORAL_TABLET | ORAL | 0 refills | Status: DC

## 2019-06-19 NOTE — Telephone Encounter (Signed)
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

## 2019-06-19 NOTE — Telephone Encounter (Signed)
Copied from [place] [id number]. Topic: General - Other >> Jun 19, 2019  8:22 AM Keene Breath wrote: Reason for CRM:  Patient would like to come into see the doctor for a cough that she has.  She stated that she had both vaccines and would like the doctor to listen to her lungs.  Please advise and call patient to see if she can come into the office instead of a virtual appt.  CB# [phone]

## 2019-06-19 NOTE — Telephone Encounter (Signed)
Called pt she has been around a lot of people and outside her chest is tight and shortness of breath scheduled virtual for today

## 2019-06-19 NOTE — Progress Notes (Signed)
BP (!) 130/59   Pulse 81   Temp 98.2 F (36.8 C) (Oral)   Wt 124 lb (56.2 kg)   LMP  (LMP Unknown)   BMI 25.04 kg/m    Subjective:    Patient ID: Kristi Clark, female    DOB: August 10, 1950, 69 y.o.   MRN: [id number]  HPI: Kristi Clark is a 69 y.o. female  Chief Complaint  Patient presents with  . Cough    since last Friday. has tried OTC tylenol  . Shortness of Breath    on and off    . This visit was completed via MyChart due to the restrictions of the COVID-19 pandemic. All issues as above were discussed and addressed. Physical exam was done as above through visual confirmation on MyChart. If it was felt that the patient should be evaluated in the office, they were directed there. The patient verbally consented to this visit. . Location of the patient: home . Location of the provider: work . Those involved with this call:  . Provider: Merrie Roof, PA-C . CMA: Lesle Chris, [place] . Front Desk/Registration: Jill Side  . Time spent on call: 15 minutes with patient face to face via video conference. More than 50% of this time was spent in counseling and coordination of care. 5 minutes total spent in review of patient's record and preparation of their chart. I verified patient identity using two factors (patient name and date of birth). Patient consents verbally to being seen via telemedicine visit today.   Presenting today for about a week of worsening cough, chest tightness, fatigue, SOB. Struggling with her COPD, allergies, but also has been around a lot of people recently due to her husband's recent passing last week. Has not been sleeping well due to stress/grief. Taking inhalers and allergy regimen faithfully, as well as some mucinex and OTC remedies. Denies fever, chills, body aches, CP, N/V/D. Has not yet been COVID tested.   Grief - husband passed last week, anxiety and sleep issues worse since this. Planning to get grief counseling through hospice, not wanting  to start any medication at this time.   Depression screen [facility] 2/9 05/16/2019 11/15/2018 09/28/2018  Decreased Interest 0 0 0  Down, Depressed, Hopeless 0 0 1  PHQ - 2 Score 0 0 1  Altered sleeping - - 0  Tired, decreased energy - - 1  Change in appetite - - 1  Feeling bad or failure about yourself  - - 0  Trouble concentrating - - 0  Moving slowly or fidgety/restless - - 0  Suicidal thoughts - - 0  PHQ-9 Score - - 3   GAD 7 : Generalized Anxiety Score 09/28/2018  Nervous, Anxious, on Edge 1  Control/stop worrying 1  Worry too much - different things 1  Trouble relaxing 0  Restless 0  Easily annoyed or irritable 1  Afraid - awful might happen 0  Total GAD 7 Score 4  Anxiety Difficulty Not difficult at all     Relevant past medical, surgical, family and social history reviewed and updated as indicated. Interim medical history since our last visit reviewed. Allergies and medications reviewed and updated.  Review of Systems  Per HPI unless specifically indicated above     Objective:    BP (!) 130/59   Pulse 81   Temp 98.2 F (36.8 C) (Oral)   Wt 124 lb (56.2 kg)   LMP  (LMP Unknown)   BMI 25.04 kg/m   Wt Readings  from Last 3 Encounters:  06/19/19 124 lb (56.2 kg)  05/16/19 122 lb (55.3 kg)  05/16/19 122 lb (55.3 kg)    Physical Exam Vitals and nursing note reviewed.  Constitutional:      General: She is not in acute distress.    Appearance: Normal appearance.  HENT:     Head: Atraumatic.     Right Ear: External ear normal.     Left Ear: External ear normal.     Nose: Congestion present.     Mouth/Throat:     Mouth: Mucous membranes are moist.     Pharynx: Oropharynx is clear. No posterior oropharyngeal erythema.  Eyes:     Extraocular Movements: Extraocular movements intact.     Conjunctiva/sclera: Conjunctivae normal.  Cardiovascular:     Comments: Unable to assess via virtual visit Pulmonary:     Effort: Pulmonary effort is normal. No respiratory  distress.  Musculoskeletal:        General: Normal range of motion.     Cervical back: Normal range of motion.  Skin:    General: Skin is dry.     Findings: No erythema.  Neurological:     Mental Status: She is alert and oriented to person, place, and time.  Psychiatric:        Mood and Affect: Mood normal.        Thought Content: Thought content normal.        Judgment: Judgment normal.     Results for orders placed or performed in visit on 05/16/19  UA/M w/rflx Culture, Routine   Specimen: Urine   URINE  Result Value Ref Range   Specific Gravity, UA 1.020 1.005 - 1.030   pH, UA 6.0 5.0 - 7.5   Color, UA Yellow Yellow   Appearance Ur Clear Clear   Leukocytes,UA Negative Negative   Protein,UA Negative Negative/Trace   Glucose, UA Negative Negative   Ketones, UA Negative Negative   RBC, UA Negative Negative   Bilirubin, UA Negative Negative   Urobilinogen, Ur 0.2 0.2 - 1.0 mg/dL   Nitrite, UA Negative Negative  Comprehensive metabolic panel  Result Value Ref Range   Glucose 91 65 - 99 mg/dL   BUN 15 8 - 27 mg/dL   Creatinine, Ser 0.89 0.57 - 1.00 mg/dL   GFR calc non Af Amer 66 >59 mL/min/1.73   GFR calc Af Amer 76 >59 mL/min/1.73   BUN/Creatinine Ratio 17 12 - 28   Sodium 139 134 - 144 mmol/L   Potassium 4.3 3.5 - 5.2 mmol/L   Chloride 99 96 - 106 mmol/L   CO2 23 20 - 29 mmol/L   Calcium 9.7 8.7 - 10.3 mg/dL   Total Protein 6.8 6.0 - 8.5 g/dL   Albumin 4.3 3.8 - 4.8 g/dL   Globulin, Total 2.5 1.5 - 4.5 g/dL   Albumin/Globulin Ratio 1.7 1.2 - 2.2   Bilirubin Total <0.2 0.0 - 1.2 mg/dL   Alkaline Phosphatase 91 39 - 117 IU/L   AST 28 0 - 40 IU/L   ALT 16 0 - 32 IU/L  Lipid Panel w/o Chol/HDL Ratio  Result Value Ref Range   Cholesterol, Total 134 100 - 199 mg/dL   Triglycerides 96 0 - 149 mg/dL   HDL 58 >39 mg/dL   VLDL Cholesterol Cal 18 5 - 40 mg/dL   LDL Chol Calc (NIH) 58 0 - 99 mg/dL      Assessment & Plan:   Problem List Items Addressed This Visit  Respiratory   Allergic rhinitis    Continue allergy regimen and monitoring      COPD, severe ([place]) - Primary    Continue home inhaler regimen, work on reducing cigarettes. Treat as an exacerbation with prednisone and abx. F/u if not improving. Declines COVID testing at this time, will quarantine instead      Relevant Medications   predniSONE (DELTASONE) 10 MG tablet    Other Visit Diagnoses    Grief       Encouraged grief counseling, pt declining medication at this time but will let us know if this changes. Condolences given       Follow up plan: Return for as scheduled.

## 2019-06-20 NOTE — Assessment & Plan Note (Signed)
Continue allergy regimen and monitoring

## 2019-06-20 NOTE — Assessment & Plan Note (Signed)
Continue home inhaler regimen, work on reducing cigarettes. Treat as an exacerbation with prednisone and abx. F/u if not improving. Declines COVID testing at this time, will quarantine instead

## 2019-06-27 ENCOUNTER — Encounter: Payer: Self-pay | Admitting: Family Medicine

## 2019-06-28 ENCOUNTER — Encounter: Payer: Self-pay | Admitting: Family Medicine

## 2019-06-28 ENCOUNTER — Other Ambulatory Visit: Payer: Self-pay | Admitting: Family Medicine

## 2019-06-28 DIAGNOSIS — R058 Other specified cough: Secondary | ICD-10-CM

## 2019-06-28 DIAGNOSIS — R05 Cough: Secondary | ICD-10-CM

## 2019-06-28 DIAGNOSIS — R509 Fever, unspecified: Secondary | ICD-10-CM

## 2019-07-01 ENCOUNTER — Other Ambulatory Visit: Payer: Self-pay

## 2019-07-01 ENCOUNTER — Ambulatory Visit
Admission: RE | Admit: 2019-07-01 | Discharge: 2019-07-01 | Disposition: A | Discharge status: Home / Self Care | Payer: Medicare Other | Source: Ambulatory Visit | Attending: Family Medicine | Admitting: Family Medicine

## 2019-07-01 DIAGNOSIS — R058 Other specified cough: Secondary | ICD-10-CM

## 2019-07-01 DIAGNOSIS — R509 Fever, unspecified: Secondary | ICD-10-CM | POA: Diagnosis not present

## 2019-07-01 DIAGNOSIS — R05 Cough: Secondary | ICD-10-CM | POA: Diagnosis not present

## 2019-07-08 ENCOUNTER — Encounter: Payer: Self-pay | Admitting: Family Medicine

## 2019-07-10 ENCOUNTER — Other Ambulatory Visit: Payer: Self-pay | Admitting: Family Medicine

## 2019-07-10 MED ORDER — AZITHROMYCIN 250 MG PO TABS: ORAL_TABLET | ORAL | 0 refills | Status: DC

## 2019-07-22 ENCOUNTER — Other Ambulatory Visit: Payer: Self-pay

## 2019-07-22 ENCOUNTER — Encounter: Payer: Self-pay | Admitting: Nurse Practitioner

## 2019-07-22 ENCOUNTER — Ambulatory Visit (INDEPENDENT_AMBULATORY_CARE_PROVIDER_SITE_OTHER): Payer: Medicare Other | Admitting: Nurse Practitioner

## 2019-07-22 ENCOUNTER — Encounter: Payer: Self-pay | Admitting: Family Medicine

## 2019-07-22 VITALS — BP 121/58 | HR 75 | Temp 98.0°F | Ht 59.0 in | Wt 120.0 lb

## 2019-07-22 DIAGNOSIS — B373 Candidiasis of vulva and vagina: Secondary | ICD-10-CM

## 2019-07-22 DIAGNOSIS — R102 Pelvic and perineal pain: Secondary | ICD-10-CM

## 2019-07-22 DIAGNOSIS — N764 Abscess of vulva: Secondary | ICD-10-CM | POA: Diagnosis not present

## 2019-07-22 DIAGNOSIS — B3731 Acute candidiasis of vulva and vagina: Secondary | ICD-10-CM

## 2019-07-22 LAB — WET PREP FOR TRICH, YEAST, CLUE
Clue Cell Exam: NEGATIVE
Trichomonas Exam: NEGATIVE
Yeast Exam: POSITIVE — AB

## 2019-07-22 MED ORDER — FLUCONAZOLE 150 MG PO TABS: 150.0000 mg | ORAL_TABLET | Freq: Once | ORAL | 0 refills | Status: AC

## 2019-07-22 MED ORDER — DOXYCYCLINE HYCLATE 100 MG PO TABS: 100.0000 mg | ORAL_TABLET | Freq: Two times a day (BID) | ORAL | 0 refills | Status: DC

## 2019-07-22 NOTE — Progress Notes (Signed)
BP (!) 121/58 (BP Location: Left Arm, Patient Position: Sitting, Cuff Size: Normal)   Pulse 75   Temp 98 F (36.7 C) (Oral)   Ht 4\' 11"  (1.499 m)   Wt 120 lb (54.4 kg)   LMP  (LMP Unknown)   SpO2 98%   BMI 24.24 kg/m    Subjective:    Patient ID: Kristi Clark, female    DOB: [birth year], 69 y.o.   MRN: [id number]  HPI: Kristi Clark is a 69 y.o. female presenting for vaginal swelling.  Chief Complaint  Patient presents with  . Groin Swelling    Patient states her vaginal area has been very swollen and painful for 3 days.    VAGINAL SWELLING Patient reports her vaginal area just started hurting and swelling on her outside labia and it appears bruised.  She just noticed this about three days ago. Duration: days Vaginal discharge: white  Pruritus: no Dysuria: no Malodorous: no Urinary frequency: no Fevers: no Abdominal pain: no  Sexual activity: not sexually active History of sexually transmitted diseases: no  Recent change in soap: recently used a scented soap Recent antibiotic use: no Context worse  Treatments attempted: vaseline, baby powder  Allergies  Allergen Reactions  . Clindamycin/Lincomycin Hives  . Amoxicillin Rash  . Avelox [Moxifloxacin Hcl In Nacl] Rash  . Codeine Sulfate Nausea Only  . Doxycycline Diarrhea    Severe  . Penicillins Rash  . Sulfa Antibiotics Rash   Outpatient Encounter Medications as of 07/22/2019  Medication Sig  . albuterol (VENTOLIN HFA) 108 (90 Base) MCG/ACT inhaler Inhale 2 puffs into the lungs every 6 (six) hours as needed for wheezing or shortness of breath.  Marland Kitchen aspirin 81 MG tablet Take 81 mg by mouth daily.  Marland Kitchen atorvastatin (LIPITOR) 40 MG tablet Take 1 tablet (40 mg total) by mouth daily.  . Calcium Carbonate (CALCIUM 600 PO) Take by mouth daily.  . Cetirizine HCl (ZYRTEC PO) Take by mouth daily.  . clopidogrel (PLAVIX) 75 MG tablet Take 1 tablet (75 mg total) by mouth daily.  . fluticasone (FLONASE) 50 MCG/ACT  nasal spray Place 2 sprays into both nostrils daily.  . Fluticasone-Salmeterol (ADVAIR) 250-50 MCG/DOSE AEPB Inhale 1 puff into the lungs 2 (two) times daily.  . hydrochlorothiazide (HYDRODIURIL) 25 MG tablet Take 1 tablet (25 mg total) by mouth daily.  . isosorbide dinitrate (ISORDIL) 30 MG tablet Take 1 tablet (30 mg total) by mouth daily.  . isosorbide mononitrate (IMDUR) 30 MG 24 hr tablet Take 1 tablet (30 mg total) by mouth daily.  Marland Kitchen losartan (COZAAR) 100 MG tablet Take 1 tablet (100 mg total) by mouth daily.  . metoprolol succinate (TOPROL-XL) 50 MG 24 hr tablet Take 1 tablet (50 mg total) by mouth daily. Take with or immediately following a meal.  . Multiple Vitamins-Minerals (CENTRUM SILVER ADULT 50+ PO) Take 1 tablet by mouth daily.  . pantoprazole (PROTONIX) 40 MG tablet Take 1 tablet (40 mg total) by mouth daily.  . Probiotic Product (PROBIOTIC-10 PO) Take by mouth. Every other day  . tiotropium (SPIRIVA) 18 MCG inhalation capsule Place 1 capsule (18 mcg total) into inhaler and inhale daily.  Marland Kitchen doxycycline (VIBRA-TABS) 100 MG tablet Take 1 tablet (100 mg total) by mouth 2 (two) times daily.  . [EXPIRED] fluconazole (DIFLUCAN) 150 MG tablet Take 1 tablet (150 mg total) by mouth once for 1 dose.  . [DISCONTINUED] azithromycin (ZITHROMAX) 250 MG tablet Take 2 tabs day one, then 1 tab daily  until complete (Patient not taking: Reported on 07/22/2019)  . [DISCONTINUED] doxycycline (VIBRA-TABS) 100 MG tablet Take 1 tablet (100 mg total) by mouth 2 (two) times daily. (Patient not taking: Reported on 07/22/2019)  . [DISCONTINUED] predniSONE (DELTASONE) 10 MG tablet Take 6 tabs day one, 5 tabs day two, 4 tabs day three, etc (Patient not taking: Reported on 07/22/2019)   No facility-administered encounter medications on file as of 07/22/2019.   Patient Active Problem List   Diagnosis Date Noted  . Tobacco abuse 10/17/2017  . IBS (irritable bowel syndrome) 04/04/2017  . Advanced care  planning/counseling discussion 10/03/2016  . CAD (coronary artery disease) 10/03/2016  . History of anemia 10/03/2016  . Benign hypertension with chronic kidney disease 03/06/2015  . Allergic rhinitis 09/03/2014  . 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 GERD (gastroesophageal reflux disease) 09/03/2014  . Hyperlipidemia 09/03/2014  . Anemia 07/14/2014  . COPD, severe ([place]) 08/08/2013  . Pulmonary nodules 08/08/2013   Past Medical History:  Diagnosis Date  . COPD (chronic obstructive pulmonary disease) ([place])   . Depression   . GERD (gastroesophageal reflux disease)   . Hyperlipidemia   . Hypertension   . Menopause   . Osteopenia   . Tobacco abuse    Relevant past medical, surgical, family and social history reviewed and updated as indicated. Interim medical history since our last visit reviewed.  Review of Systems  Constitutional: Negative.  Negative for activity change, appetite change, chills, fatigue and fever.  Genitourinary: Positive for genital sores, vaginal discharge and vaginal pain. Negative for decreased urine volume, dysuria, flank pain, frequency, hematuria, pelvic pain, urgency and vaginal bleeding.  Musculoskeletal: Negative.   Skin:       Bruising to labia  Neurological: Negative.  Negative for dizziness, light-headedness and headaches.  Hematological: Negative.   Psychiatric/Behavioral: Negative.    Per HPI unless specifcally indicated above     Objective:    BP (!) 121/58 (BP Location: Left Arm, Patient Position: Sitting, Cuff Size: Normal)   Pulse 75   Temp 98 F (36.7 C) (Oral)   Ht 4\' 11"  (1.499 m)   Wt 120 lb (54.4 kg)   LMP  (LMP Unknown)   SpO2 98%   BMI 24.24 kg/m   Wt Readings from Last 3 Encounters:  07/22/19 120 lb (54.4 kg)  06/19/19 124 lb (56.2 kg)  05/16/19 122 lb (55.3 kg)    Physical Exam Vitals and nursing note reviewed. Exam conducted with a chaperone present.  Constitutional:      General: She is not in acute  distress.    Appearance: She is not toxic-appearing.     Comments: Uncomfortable-appearing  Genitourinary:    Exam position: Lithotomy position.     Labia:        Right: Lesion (2.5 cm fluctuant, abscess, tender to palpation) present.        Left: No lesion.   Musculoskeletal:        General: Normal range of motion.     Right lower leg: No edema.     Left lower leg: No edema.  Lymphadenopathy:     Lower Body: No right inguinal adenopathy. No left inguinal adenopathy.  Skin:    General: Skin is warm and dry.     Capillary Refill: Capillary refill takes less than 2 seconds.     Coloration: Skin is not jaundiced or pale.     Findings: Abscess (located to right labia) present.  Neurological:     General:  No focal deficit present.     Mental Status: She is alert.     Motor: No weakness.     Gait: Gait normal.  Psychiatric:        Mood and Affect: Mood normal.        Behavior: Behavior normal.        Thought Content: Thought content normal.        Judgment: Judgment normal.      Assessment & Plan:   Problem List Items Addressed This Visit    None    Visit Diagnoses    Vaginal pain    -  Primary   Relevant Medications   doxycycline (VIBRA-TABS) 100 MG tablet   Other Relevant Orders   WET PREP FOR [place], YEAST, CLUE (Completed)   Labial abscess       Relevant Medications   doxycycline (VIBRA-TABS) 100 MG tablet   Vaginal yeast infection           Skin Procedure   Diagnosis:   ICD-10-CM   1. Vaginal pain  R10.2 WET PREP FOR [place], YEAST, CLUE    doxycycline (VIBRA-TABS) 100 MG tablet  2. Labial abscess  N76.4 doxycycline (VIBRA-TABS) 100 MG tablet  3. Vaginal yeast infection  B37.3 fluconazole (DIFLUCAN) 150 MG tablet    Lesion Location/Size: Right labia, 2.5 cm x 1.5 cm ellipse-shaped abscess Physician: Carnella Guadalajara, NP with supervision of Park Liter, DO Consent:  Risks, benefits, and alternative treatments discussed and all questions were answered.  Patient  elected to proceed and verbal consent obtained.  Description: Area prepped and draped using semi-sterile technique. Area locally anesthetized using 7 cc's of lidocaine 1% plain.  Using a 15 blade scalpel, a 1.5 cm vertical incision was made, and the abscess cavity was entered.  Moderate amount of bloody and purulent material expressed.    Post Procedure Instructions:  Wound care instructions discussed and patient was instructed to keep area clean and as dry as possible.  Signs and symptoms of infection discussed, patient agrees to contact the office ASAP should they occur.  Advised okay to shower, but no baths until > 48 hours have passed since procedure.   Follow up plan: Return in about 5 days (around 07/27/2019) for follow up.

## 2019-07-22 NOTE — Patient Instructions (Signed)
Skin Abscess  A skin abscess is an infected area on or under your skin that contains a collection of pus and other material. An abscess may also be called a furuncle, carbuncle, or boil. An abscess can occur in or on almost any part of your body. Some abscesses break open (rupture) on their own. Most continue to get worse unless they are treated. The infection can spread deeper into the body and eventually into your blood, which can make you feel ill. Treatment usually involves draining the abscess. What are the causes? An abscess occurs when germs, like bacteria, pass through your skin and cause an infection. This may be caused by:  A scrape or cut on your skin.  A puncture wound through your skin, including a needle injection or insect bite.  Blocked oil or sweat glands.  Blocked and infected hair follicles.  A cyst that forms beneath your skin (sebaceous cyst) and becomes infected. What increases the risk? This condition is more likely to develop in people who:  Have a weak body defense system (immune system).  Have diabetes.  Have dry and irritated skin.  Get frequent injections or use illegal IV drugs.  Have a foreign body in a wound, such as a splinter.  Have problems with their lymph system or veins. What are the signs or symptoms? Symptoms of this condition include:  A painful, firm bump under the skin.  A bump with pus at the top. This may break through the skin and drain. Other symptoms include:  Redness surrounding the abscess site.  Warmth.  Swelling of the lymph nodes (glands) near the abscess.  Tenderness.  A sore on the skin. How is this diagnosed? This condition may be diagnosed based on:  A physical exam.  Your medical history.  A sample of pus. This may be used to find out what is causing the infection.  Blood tests.  Imaging tests, such as an ultrasound, CT scan, or MRI. How is this treated? A small abscess that drains on its own may  not need treatment. Treatment for larger abscesses may include:  Moist heat or heat pack applied to the area several times a day.  A procedure to drain the abscess (incision and drainage).  Antibiotic medicines. For a severe abscess, you may first get antibiotics through an IV and then change to antibiotics by mouth. Follow these instructions at home: Medicines   Take over-the-counter and prescription medicines only as told by your health care provider.  If you were prescribed an antibiotic medicine, take it as told by your health care provider. Do not stop taking the antibiotic even if you start to feel better. Abscess care   If you have an abscess that has not drained, apply heat to the affected area. Use the heat source that your health care provider recommends, such as a moist heat pack or a heating pad. ? Place a towel between your skin and the heat source. ? Leave the heat on for 20-30 minutes. ? Remove the heat if your skin turns bright red. This is especially important if you are unable to feel pain, heat, or cold. You may have a greater risk of getting burned.  Follow instructions from your health care provider about how to take care of your abscess. Make sure you: ? Cover the abscess with a bandage (dressing). ? Change your dressing or gauze as told by your health care provider. ? Wash your hands with soap and water before you change the   dressing or gauze. If soap and water are not available, use hand sanitizer.  Check your abscess every day for signs of a worsening infection. Check for: ? More redness, swelling, or pain. ? More fluid or blood. ? Warmth. ? More pus or a bad smell. General instructions  To avoid spreading the infection: ? Do not share personal care items, towels, or hot tubs with others. ? Avoid making skin contact with other people.  Keep all follow-up visits as told by your health care provider. This is important. Contact a health care provider if  you have:  More redness, swelling, or pain around your abscess.  More fluid or blood coming from your abscess.  Warm skin around your abscess.  More pus or a bad smell coming from your abscess.  A fever.  Muscle aches.  Chills or a general ill feeling. Get help right away if you:  Have severe pain.  See red streaks on your skin spreading away from the abscess. Summary  A skin abscess is an infected area on or under your skin that contains a collection of pus and other material.  A small abscess that drains on its own may not need treatment.  Treatment for larger abscesses may include having a procedure to drain the abscess and taking an antibiotic. This information is not intended to replace advice given to you by your health care provider. Make sure you discuss any questions you have with your health care provider. Document Revised: 06/14/2018 Document Reviewed: 04/06/2017 Elsevier Patient Education  2020 [employer]  

## 2019-07-29 ENCOUNTER — Ambulatory Visit (INDEPENDENT_AMBULATORY_CARE_PROVIDER_SITE_OTHER): Payer: Medicare Other | Admitting: Family Medicine

## 2019-07-29 ENCOUNTER — Other Ambulatory Visit: Payer: Self-pay

## 2019-07-29 ENCOUNTER — Encounter: Payer: Self-pay | Admitting: Family Medicine

## 2019-07-29 VITALS — BP 146/68 | HR 71 | Temp 98.1°F | Wt 120.0 lb

## 2019-07-29 DIAGNOSIS — I2510 Atherosclerotic heart disease of native coronary artery without angina pectoris: Secondary | ICD-10-CM | POA: Diagnosis not present

## 2019-07-29 DIAGNOSIS — N764 Abscess of vulva: Secondary | ICD-10-CM | POA: Diagnosis not present

## 2019-07-29 NOTE — Progress Notes (Signed)
   BP (!) 146/68   Pulse 71   Temp 98.1 F (36.7 C) (Oral)   Wt 120 lb (54.4 kg)   LMP  (LMP Unknown)   SpO2 98%   BMI 24.24 kg/m    Subjective:    Patient ID: Kristi Clark, female    DOB: January 07, 1951, 69 y.o.   MRN: [id number]  HPI: Kristi Clark is a 69 y.o. female  Chief Complaint  Patient presents with  . Vaginal Pain   Here today following up on a vaginal boil that was drained last week. Taking doxycycline and does feel like things are improving. The area drained for a few days but now no further drainage just some swelling and tenderness still lingering in the area. Denies fever, chills, sweats.   Relevant past medical, surgical, family and social history reviewed and updated as indicated. Interim medical history since our last visit reviewed. Allergies and medications reviewed and updated.  Review of Systems  Per HPI unless specifically indicated above     Objective:    BP (!) 146/68   Pulse 71   Temp 98.1 F (36.7 C) (Oral)   Wt 120 lb (54.4 kg)   LMP  (LMP Unknown)   SpO2 98%   BMI 24.24 kg/m   Wt Readings from Last 3 Encounters:  07/29/19 120 lb (54.4 kg)  07/22/19 120 lb (54.4 kg)  06/19/19 124 lb (56.2 kg)    Physical Exam Vitals and nursing note reviewed.  Constitutional:      Appearance: Normal appearance. She is not ill-appearing.  HENT:     Head: Atraumatic.  Eyes:     Extraocular Movements: Extraocular movements intact.     Conjunctiva/sclera: Conjunctivae normal.  Cardiovascular:     Rate and Rhythm: Normal rate and regular rhythm.     Heart sounds: Normal heart sounds.  Pulmonary:     Effort: Pulmonary effort is normal.     Breath sounds: Normal breath sounds.  Genitourinary:    Comments: Mild residual edema of clitoris at site of I and D of abscess, no active drainage, redness, ttp Musculoskeletal:        General: Normal range of motion.     Cervical back: Normal range of motion and neck supple.  Skin:    General: Skin  is warm and dry.  Neurological:     Mental Status: She is alert and oriented to person, place, and time.  Psychiatric:        Mood and Affect: Mood normal.        Thought Content: Thought content normal.        Judgment: Judgment normal.     Results for orders placed or performed in visit on 07/22/19  WET PREP FOR [place], YEAST, CLUE   Specimen: Vaginal; Sterile Swab   STERILE SWAB  Result Value Ref Range   Trichomonas Exam Negative Negative   Yeast Exam Positive (A) Negative   Clue Cell Exam Negative Negative      Assessment & Plan:   Problem List Items Addressed This Visit    None    Visit Diagnoses    Labial abscess    -  Primary   Healing without obvious complication, sxs improving. Complete abx, sitz baths, keep clean and dry. F/u if sxs worsening at any point for recheck       Follow up plan: Return for as scheduled.

## 2019-08-01 DIAGNOSIS — F17210 Nicotine dependence, cigarettes, uncomplicated: Secondary | ICD-10-CM | POA: Diagnosis not present

## 2019-08-01 DIAGNOSIS — J479 Bronchiectasis, uncomplicated: Secondary | ICD-10-CM | POA: Diagnosis not present

## 2019-08-01 DIAGNOSIS — R0602 Shortness of breath: Secondary | ICD-10-CM | POA: Diagnosis not present

## 2019-08-01 DIAGNOSIS — J439 Emphysema, unspecified: Secondary | ICD-10-CM | POA: Diagnosis not present

## 2019-08-12 DIAGNOSIS — I10 Essential (primary) hypertension: Secondary | ICD-10-CM | POA: Diagnosis not present

## 2019-08-12 DIAGNOSIS — E782 Mixed hyperlipidemia: Secondary | ICD-10-CM | POA: Diagnosis not present

## 2019-08-12 DIAGNOSIS — Z9861 Coronary angioplasty status: Secondary | ICD-10-CM | POA: Diagnosis not present

## 2019-08-12 DIAGNOSIS — J449 Chronic obstructive pulmonary disease, unspecified: Secondary | ICD-10-CM | POA: Diagnosis not present

## 2019-08-12 DIAGNOSIS — I2510 Atherosclerotic heart disease of native coronary artery without angina pectoris: Secondary | ICD-10-CM | POA: Diagnosis not present

## 2019-09-05 ENCOUNTER — Encounter: Payer: Self-pay | Admitting: Family Medicine

## 2019-09-05 ENCOUNTER — Other Ambulatory Visit: Payer: Self-pay | Admitting: Family Medicine

## 2019-09-05 MED ORDER — TIOTROPIUM BROMIDE MONOHYDRATE 18 MCG IN CAPS: 18.0000 ug | ORAL_CAPSULE | Freq: Every day | RESPIRATORY_TRACT | 3 refills | Status: DC

## 2019-09-23 ENCOUNTER — Telehealth: Payer: Self-pay

## 2019-09-23 DIAGNOSIS — Z122 Encounter for screening for malignant neoplasm of respiratory organs: Secondary | ICD-10-CM

## 2019-09-23 DIAGNOSIS — Z87891 Personal history of nicotine dependence: Secondary | ICD-10-CM

## 2019-09-23 NOTE — Telephone Encounter (Signed)
Contacted patient to schedule her annual lung screening CT scan.  Patient agreeable and scan scheduled for August 26 at 1:15.  Patient aware of location of scan.  Patient has not had a COVID vaccine in the last 4 weeks.  Her current smoking status is 1 1/2 packs per day.

## 2019-09-24 NOTE — Telephone Encounter (Signed)
Smoking history: current, 85.5 pack year

## 2019-09-24 NOTE — Addendum Note (Signed)
Addended by: Lieutenant Diego on: 09/24/2019 12:56 PM   Modules accepted: Orders

## 2019-10-06 ENCOUNTER — Encounter: Payer: Self-pay | Admitting: Family Medicine

## 2019-10-08 ENCOUNTER — Other Ambulatory Visit: Payer: Self-pay | Admitting: Nurse Practitioner

## 2019-10-08 MED ORDER — FLUTICASONE-SALMETEROL 250-50 MCG/DOSE IN AEPB: 1.0000 | INHALATION_SPRAY | Freq: Two times a day (BID) | RESPIRATORY_TRACT | 1 refills | Status: DC

## 2019-10-08 NOTE — Telephone Encounter (Signed)
Advair sent in

## 2019-10-20 ENCOUNTER — Encounter: Payer: Self-pay | Admitting: Family Medicine

## 2019-10-21 ENCOUNTER — Other Ambulatory Visit: Payer: Self-pay | Admitting: Family Medicine

## 2019-10-21 DIAGNOSIS — Z1231 Encounter for screening mammogram for malignant neoplasm of breast: Secondary | ICD-10-CM

## 2019-10-21 MED ORDER — HYDROCHLOROTHIAZIDE 25 MG PO TABS: 25.0000 mg | ORAL_TABLET | Freq: Every day | ORAL | 1 refills | Status: DC

## 2019-10-21 MED ORDER — PANTOPRAZOLE SODIUM 40 MG PO TBEC: 40.0000 mg | DELAYED_RELEASE_TABLET | Freq: Every day | ORAL | 1 refills | Status: DC

## 2019-10-21 MED ORDER — ISOSORBIDE DINITRATE 30 MG PO TABS: 30.0000 mg | ORAL_TABLET | Freq: Every day | ORAL | 1 refills | Status: DC

## 2019-10-21 MED ORDER — ATORVASTATIN CALCIUM 40 MG PO TABS: 40.0000 mg | ORAL_TABLET | Freq: Every day | ORAL | 1 refills | Status: DC

## 2019-10-21 MED ORDER — ISOSORBIDE MONONITRATE ER 30 MG PO TB24: 30.0000 mg | ORAL_TABLET | Freq: Every day | ORAL | 1 refills | Status: DC

## 2019-10-21 MED ORDER — LOSARTAN POTASSIUM 100 MG PO TABS: 100.0000 mg | ORAL_TABLET | Freq: Every day | ORAL | 1 refills | Status: DC

## 2019-10-21 MED ORDER — METOPROLOL SUCCINATE ER 50 MG PO TB24: 50.0000 mg | ORAL_TABLET | Freq: Every day | ORAL | 1 refills | Status: DC

## 2019-10-21 MED ORDER — CLOPIDOGREL BISULFATE 75 MG PO TABS: 75.0000 mg | ORAL_TABLET | Freq: Every day | ORAL | 1 refills | Status: DC

## 2019-10-21 MED ORDER — FLUTICASONE PROPIONATE 50 MCG/ACT NA SUSP: 2.0000 | Freq: Every day | NASAL | 3 refills | Status: DC

## 2019-10-28 ENCOUNTER — Telehealth (INDEPENDENT_AMBULATORY_CARE_PROVIDER_SITE_OTHER): Payer: Medicare Other | Admitting: Nurse Practitioner

## 2019-10-28 ENCOUNTER — Encounter: Payer: Self-pay | Admitting: Nurse Practitioner

## 2019-10-28 ENCOUNTER — Other Ambulatory Visit: Payer: Self-pay

## 2019-10-28 VITALS — BP 150/68 | HR 79 | Temp 97.8°F | Ht 59.0 in | Wt 115.0 lb

## 2019-10-28 DIAGNOSIS — R05 Cough: Secondary | ICD-10-CM | POA: Diagnosis not present

## 2019-10-28 DIAGNOSIS — J449 Chronic obstructive pulmonary disease, unspecified: Secondary | ICD-10-CM

## 2019-10-28 DIAGNOSIS — R059 Cough, unspecified: Secondary | ICD-10-CM

## 2019-10-28 NOTE — Patient Instructions (Signed)

## 2019-10-28 NOTE — Progress Notes (Signed)
BP (!) 150/68 (BP Location: Left Arm, Patient Position: Sitting, Cuff Size: Normal)   Pulse 79   Temp 97.8 F (36.6 C) (Oral)   Ht 4\' 11"  (1.499 m)   Wt 115 lb (52.2 kg)   LMP  (LMP Unknown)   BMI 23.23 kg/m    Subjective:    Patient ID: Kristi Clark, female    DOB: [birth year], 69 y.o.   MRN: [id number]  HPI: EMMERSEN GARRAWAY is a 69 y.o. female presenting with chest congestion and cough.  Chief Complaint  Patient presents with  . Chest Congestion    Patient has been coughing, thinks she pulled a muscle in chest. Severe chest/lung pain when she coughs or takes a deep breath.   . Cough   UPPER RESPIRATORY TRACT INFECTION Patient reports she recently got a battery-operated push lawn mower and has been using it to mow her grass.  Also recently developed pain around her back and shoulder that she is worried may be pneumonia.  She has also recently been watching her great grand-daughter and lifting her up which she is not used to.  She goes Thursday for her low-dose CT screening of her lungs. Worst symptom: pain around right back/shoulder. Fever: no Cough: yes; productive Shortness of breath: no Wheezing: no; only before blowing nose in morning Chest pain: no Chest tightness: no Chest congestion: yes Nasal congestion: no Runny nose: no Post nasal drip: no Sneezing: no Sore throat: no Swollen glands: no Sinus pressure: no Headache: no Face pain: no Toothache: no Ear pain: no  Ear pressure: no  Eyes red/itching:no Eye drainage/crusting: no  Nausea: no Vomiting: no Rash: no Fatigue: no Sick contacts: no Strep contacts: no  Context: better Recurrent sinusitis: no Relief with OTC cold/cough medications: yes  Treatments attempted: Tylenol   Allergies  Allergen Reactions  . Clindamycin/Lincomycin Hives  . Amoxicillin Rash  . Avelox [Moxifloxacin Hcl In Nacl] Rash  . Codeine Sulfate Nausea Only  . Doxycycline Diarrhea    Severe  . Penicillins Rash  .  Sulfa Antibiotics Rash   Outpatient Encounter Medications as of 10/28/2019  Medication Sig  . albuterol (VENTOLIN HFA) 108 (90 Base) MCG/ACT inhaler Inhale 2 puffs into the lungs every 6 (six) hours as needed for wheezing or shortness of breath.  Marland Kitchen aspirin 81 MG tablet Take 81 mg by mouth daily.  Marland Kitchen atorvastatin (LIPITOR) 40 MG tablet Take 1 tablet (40 mg total) by mouth daily.  . Calcium Carbonate (CALCIUM 600 PO) Take by mouth daily.  . Cetirizine HCl (ZYRTEC PO) Take by mouth daily.  . clopidogrel (PLAVIX) 75 MG tablet Take 1 tablet (75 mg total) by mouth daily.  . fluticasone (FLONASE) 50 MCG/ACT nasal spray Place 2 sprays into both nostrils daily.  . Fluticasone-Salmeterol (ADVAIR) 250-50 MCG/DOSE AEPB Inhale 1 puff into the lungs 2 (two) times daily.  . hydrochlorothiazide (HYDRODIURIL) 25 MG tablet Take 1 tablet (25 mg total) by mouth daily.  . isosorbide mononitrate (IMDUR) 30 MG 24 hr tablet Take 1 tablet (30 mg total) by mouth daily.  Marland Kitchen losartan (COZAAR) 100 MG tablet Take 1 tablet (100 mg total) by mouth daily.  . metoprolol succinate (TOPROL-XL) 50 MG 24 hr tablet Take 1 tablet (50 mg total) by mouth daily. Take with or immediately following a meal.  . Multiple Vitamins-Minerals (CENTRUM SILVER ADULT 50+ PO) Take 1 tablet by mouth daily.  . pantoprazole (PROTONIX) 40 MG tablet Take 1 tablet (40 mg total) by mouth daily.  Marland Kitchen  Probiotic Product (PROBIOTIC-10 PO) Take by mouth. Every other day  . tiotropium (SPIRIVA) 18 MCG inhalation capsule Place 1 capsule (18 mcg total) into inhaler and inhale daily.  . [DISCONTINUED] doxycycline (VIBRA-TABS) 100 MG tablet Take 1 tablet (100 mg total) by mouth 2 (two) times daily. (Patient not taking: Reported on 10/28/2019)  . [DISCONTINUED] isosorbide dinitrate (ISORDIL) 30 MG tablet Take 1 tablet (30 mg total) by mouth daily. (Patient not taking: Reported on 10/28/2019)   No facility-administered encounter medications on file as of 10/28/2019.    Patient Active Problem List   Diagnosis Date Noted  . Cough 10/29/2019  . Tobacco abuse 10/17/2017  . IBS (irritable bowel syndrome) 04/04/2017  . Advanced care planning/counseling discussion 10/03/2016  . CAD (coronary artery disease) 10/03/2016  . History of anemia 10/03/2016  . Benign hypertension with chronic kidney disease 03/06/2015  . Allergic rhinitis 09/03/2014  . 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 GERD (gastroesophageal reflux disease) 09/03/2014  . Hyperlipidemia 09/03/2014  . Anemia 07/14/2014  . COPD, severe ([place]) 08/08/2013  . Pulmonary nodules 08/08/2013   Past Medical History:  Diagnosis Date  . COPD (chronic obstructive pulmonary disease) ([place])   . Depression   . GERD (gastroesophageal reflux disease)   . Hyperlipidemia   . Hypertension   . Menopause   . Osteopenia   . Tobacco abuse    Relevant past medical, surgical, family and social history reviewed and updated as indicated. Interim medical history since our last visit reviewed.  Review of Systems  Constitutional: Negative for activity change, appetite change, fatigue and fever.  HENT: Positive for congestion. Negative for ear discharge, ear pain, facial swelling, postnasal drip, rhinorrhea, sinus pressure, sinus pain and sore throat.   Eyes: Negative.  Negative for pain, discharge, redness, itching and visual disturbance.  Respiratory: Positive for cough. Negative for chest tightness, shortness of breath and wheezing.   Cardiovascular: Negative for chest pain.  Gastrointestinal: Negative.  Negative for nausea and vomiting.  Skin: Negative.  Negative for rash.  Neurological: Negative.   Psychiatric/Behavioral: Negative.     Per HPI unless specifically indicated above     Objective:    BP (!) 150/68 (BP Location: Left Arm, Patient Position: Sitting, Cuff Size: Normal)   Pulse 79   Temp 97.8 F (36.6 C) (Oral)   Ht 4\' 11"  (1.499 m)   Wt 115 lb (52.2 kg)   LMP  (LMP Unknown)    BMI 23.23 kg/m   Wt Readings from Last 3 Encounters:  10/28/19 115 lb (52.2 kg)  07/29/19 120 lb (54.4 kg)  07/22/19 120 lb (54.4 kg)    Physical Exam Vitals and nursing note reviewed.  Constitutional:      General: She is not in acute distress.    Appearance: Normal appearance. She is not ill-appearing or toxic-appearing.  HENT:     Head: Normocephalic and atraumatic.     Nose: Nose normal. No congestion or rhinorrhea.     Mouth/Throat:     Mouth: Mucous membranes are moist.     Pharynx: Oropharynx is clear.  Eyes:     General: No scleral icterus.       Right eye: No discharge.        Left eye: No discharge.     Extraocular Movements: Extraocular movements intact.  Cardiovascular:     Comments: Unable to assess heart sounds via virtual visit Pulmonary:     Effort: Pulmonary effort is normal. No respiratory distress.  Comments: Unable to assess lung sounds via virtual visit - patient talking in complete sentences with occasional congested cough Abdominal:     Comments: Unable to assess bowel sounds via virtual visit  Skin:    Coloration: Skin is not jaundiced or pale.     Findings: No erythema.  Neurological:     General: No focal deficit present.     Mental Status: She is alert and oriented to person, place, and time.  Psychiatric:        Mood and Affect: Mood normal.        Behavior: Behavior normal.        Thought Content: Thought content normal.        Judgment: Judgment normal.        Assessment & Plan:   Problem List Items Addressed This Visit      Respiratory   COPD, severe ([place])    Chronic, ongoing.  Encouraged continued use of home inhalers - if cough becomes a more persistent problem, may need to adjust regimen.  Continue cutting back on cigarettes.  Await results of low-dose CT scan later this week.        Other   Cough - Primary    Acute, ongoing.  Etiology viral vs. COPD exacerbation.  Viral seems unlikely with no other viral symptoms.   Encouraged ongoing use of prescribed inhalers and to return to clinic if severity worsens; currently symptoms mostly resolve after morning inhaler use.  With any sudden onset of shortness of breath or chest pain, go to ER.          Follow up plan: Return if symptoms worsen or fail to improve.  Due to the catastrophic nature of the COVID-19 pandemic, this visit was completed via audio and visual contact via Mychart due to the restrictions of the COVID-19 pandemic. All issues as above were discussed and addressed. Physical exam was done as above through visual confirmation on Mychart. If it was felt that the patient should be evaluated in the office, they were directed there. The patient verbally consented to this visit."} . Location of the patient: home . Location of the provider: work . Those involved with this call:  . Provider: Carnella Guadalajara, DNP . CMA: Merilyn Baba, CMA . Front Desk/Registration: [facility]  . Time spent on call: 15 minutes with patient face to face via video conference. More than 50% of this time was spent in counseling and coordination of care. 30 minutes total spent in review of patient's record and preparation of their chart.  I verified patient identity using two factors (patient name and date of birth). Patient consents verbally to being seen via telemedicine visit today.

## 2019-10-29 DIAGNOSIS — R059 Cough, unspecified: Secondary | ICD-10-CM | POA: Insufficient documentation

## 2019-10-29 NOTE — Assessment & Plan Note (Signed)
Chronic, ongoing.  Encouraged continued use of home inhalers - if cough becomes a more persistent problem, may need to adjust regimen.  Continue cutting back on cigarettes.  Await results of low-dose CT scan later this week.

## 2019-10-29 NOTE — Assessment & Plan Note (Signed)
Acute, ongoing.  Etiology viral vs. COPD exacerbation.  Viral seems unlikely with no other viral symptoms.  Encouraged ongoing use of prescribed inhalers and to return to clinic if severity worsens; currently symptoms mostly resolve after morning inhaler use.  With any sudden onset of shortness of breath or chest pain, go to ER.

## 2019-10-31 ENCOUNTER — Telehealth: Payer: Self-pay | Admitting: Family Medicine

## 2019-10-31 ENCOUNTER — Ambulatory Visit
Admission: RE | Admit: 2019-10-31 | Discharge: 2019-10-31 | Disposition: A | Discharge status: Home / Self Care | Payer: Medicare Other | Source: Ambulatory Visit | Attending: Oncology | Admitting: Oncology

## 2019-10-31 ENCOUNTER — Other Ambulatory Visit: Payer: Self-pay

## 2019-10-31 DIAGNOSIS — Z87891 Personal history of nicotine dependence: Secondary | ICD-10-CM | POA: Diagnosis not present

## 2019-10-31 DIAGNOSIS — Z122 Encounter for screening for malignant neoplasm of respiratory organs: Secondary | ICD-10-CM | POA: Diagnosis not present

## 2019-10-31 DIAGNOSIS — F17210 Nicotine dependence, cigarettes, uncomplicated: Secondary | ICD-10-CM | POA: Diagnosis not present

## 2019-10-31 NOTE — Telephone Encounter (Signed)
Copied from [place] [phone]. Topic: Medicare AWV >> Oct 31, 2019  1:34 PM Cher Nakai R wrote: Reason for CRM:  Left message for patient to call back and schedule the Medicare Annual Wellness Visit (AWV) virtually.  Last AWV 05/16/2019  Please schedule at anytime with [facility] Advisor.  45 minute appointment  Any questions, please call me at [phone]

## 2019-11-01 ENCOUNTER — Other Ambulatory Visit: Payer: Self-pay

## 2019-11-01 ENCOUNTER — Encounter: Payer: Self-pay | Admitting: Family Medicine

## 2019-11-01 ENCOUNTER — Telehealth: Payer: Self-pay | Admitting: Family Medicine

## 2019-11-01 ENCOUNTER — Ambulatory Visit (INDEPENDENT_AMBULATORY_CARE_PROVIDER_SITE_OTHER): Payer: Medicare Other | Admitting: Family Medicine

## 2019-11-01 ENCOUNTER — Telehealth: Payer: Self-pay | Admitting: *Deleted

## 2019-11-01 ENCOUNTER — Ambulatory Visit: Payer: Self-pay

## 2019-11-01 VITALS — BP 158/66 | HR 79 | Temp 98.4°F | Wt 115.6 lb

## 2019-11-01 DIAGNOSIS — I2510 Atherosclerotic heart disease of native coronary artery without angina pectoris: Secondary | ICD-10-CM | POA: Diagnosis not present

## 2019-11-01 DIAGNOSIS — R21 Rash and other nonspecific skin eruption: Secondary | ICD-10-CM

## 2019-11-01 MED ORDER — PREDNISONE 10 MG PO TABS: ORAL_TABLET | ORAL | 0 refills | Status: DC

## 2019-11-01 MED ORDER — VALACYCLOVIR HCL 1 G PO TABS: 1000.0000 mg | ORAL_TABLET | Freq: Three times a day (TID) | ORAL | 0 refills | Status: DC

## 2019-11-01 NOTE — Telephone Encounter (Signed)
Patient experiencing a cluster of red bumps on left side extremely painful for 1 week in addition to a cough. Patient PCP has no availability but may work in as per the practice, patient awaiting a follow up call. Also advised patient to reach out to [facility] [phone] patient agreed.       Called pt. Back and states the practice has called her back and will see her today.

## 2019-11-01 NOTE — Progress Notes (Signed)
FYI

## 2019-11-01 NOTE — Progress Notes (Signed)
BP (!) 158/66   Pulse 79   Temp 98.4 F (36.9 C) (Oral)   Wt 115 lb 9.6 oz (52.4 kg)   LMP  (LMP Unknown)   SpO2 95%   BMI 23.35 kg/m    Subjective:    Patient ID: Kristi Clark, female    DOB: 20-Apr-1950, 69 y.o.   MRN: [id number]  HPI: Kristi Clark is a 69 y.o. female  Chief Complaint  Patient presents with  . Rash    pt states she has been having pain on her left side. States it started on her back and now is starting to have a rash   Here today with a painful rash left lateral ribs that has been present for about a week. Has not been putting anything on it. Denies fever, chills, new exposures, insect bites, injuries to area. Hx of shingles, has had one dose of shingrix but never went for second vaccine.   Relevant past medical, surgical, family and social history reviewed and updated as indicated. Interim medical history since our last visit reviewed. Allergies and medications reviewed and updated.  Review of Systems  Per HPI unless specifically indicated above     Objective:    BP (!) 158/66   Pulse 79   Temp 98.4 F (36.9 C) (Oral)   Wt 115 lb 9.6 oz (52.4 kg)   LMP  (LMP Unknown)   SpO2 95%   BMI 23.35 kg/m   Wt Readings from Last 3 Encounters:  11/01/19 115 lb 9.6 oz (52.4 kg)  10/31/19 115 lb (52.2 kg)  10/28/19 115 lb (52.2 kg)    Physical Exam Vitals and nursing note reviewed.  Constitutional:      Appearance: Normal appearance. She is not ill-appearing.  HENT:     Head: Atraumatic.  Eyes:     Extraocular Movements: Extraocular movements intact.     Conjunctiva/sclera: Conjunctivae normal.  Cardiovascular:     Rate and Rhythm: Normal rate and regular rhythm.     Heart sounds: Normal heart sounds.  Pulmonary:     Effort: Pulmonary effort is normal.     Breath sounds: Normal breath sounds.  Musculoskeletal:        General: Normal range of motion.     Cervical back: Normal range of motion and neck supple.  Skin:    General: Skin  is warm and dry.     Findings: Rash (erythematous cluster of papules left lateral ribs, ttp) present.  Neurological:     Mental Status: She is alert and oriented to person, place, and time.  Psychiatric:        Mood and Affect: Mood normal.        Thought Content: Thought content normal.        Judgment: Judgment normal.     Results for orders placed or performed in visit on 07/22/19  WET PREP FOR [place], YEAST, CLUE   Specimen: Vaginal; Sterile Swab   STERILE SWAB  Result Value Ref Range   Trichomonas Exam Negative Negative   Yeast Exam Positive (A) Negative   Clue Cell Exam Negative Negative      Assessment & Plan:   Problem List Items Addressed This Visit    None    Visit Diagnoses    Rash    -  Primary   Suspect shingles, tx with valtrex, prednisone taper. F/u if worsening or not improving       Follow up plan: Return if symptoms worsen or fail  to improve.

## 2019-11-01 NOTE — Telephone Encounter (Signed)
Patient is calling back to let Tiffany know that she is going UC for pain her side and a rash. Patient believes she may has Shingles. Patient reports that she is able to UC due to the lack of availability of an OV appt.

## 2019-11-01 NOTE — Telephone Encounter (Signed)
Notified patient of LDCT lung cancer screening program results with recommendation for follow up with pulmonary or thoracic surgery. Also notified of incidental findings noted below and is encouraged to discuss further with PCP who will receive a copy of this note and/or the CT report. Patient verbalizes understanding.   Patient is instructed to call pulmonologist office on Monday morning. Results have been forwarded to Dr. Gust Brooms office per patient's instruction.  IMPRESSION: 1. 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 Additional imaging evaluation or consultation with Pulmonology or Thoracic Surgery recommended. Given the high likelihood of false-positive examination on today's study and future lung cancer screening examinations, this patient may not prove to be a suitable candidate for continued lung cancer screening. Outpatient referral to Pulmonology for further evaluation to determine if there is indeed evidence of chronic atypical infection is strongly recommended. 2. The "S" modifier above refers to potentially clinically significant non lung cancer related findings. Specifically, there is aortic atherosclerosis, in addition to left main and 3 vessel coronary artery disease. Assessment for potential risk factor modification, dietary therapy or pharmacologic therapy may be warranted, if clinically indicated. 3. Diffuse bronchial wall thickening with moderate centrilobular and paraseptal emphysema; imaging findings suggestive of underlying COPD.  Aortic Atherosclerosis (ICD10-I70.0) and Emphysema (ICD10-

## 2019-11-04 ENCOUNTER — Emergency Department: Payer: Medicare Other

## 2019-11-04 ENCOUNTER — Emergency Department
Admission: EM | Admit: 2019-11-04 | Discharge: 2019-11-04 | Disposition: A | Discharge status: Home / Self Care | Payer: Medicare Other | Attending: Emergency Medicine | Admitting: Emergency Medicine

## 2019-11-04 ENCOUNTER — Ambulatory Visit: Payer: Self-pay | Admitting: *Deleted

## 2019-11-04 ENCOUNTER — Other Ambulatory Visit: Payer: Self-pay

## 2019-11-04 ENCOUNTER — Encounter: Payer: Self-pay | Admitting: Emergency Medicine

## 2019-11-04 DIAGNOSIS — S0993XA Unspecified injury of face, initial encounter: Secondary | ICD-10-CM | POA: Diagnosis not present

## 2019-11-04 DIAGNOSIS — Y939 Activity, unspecified: Secondary | ICD-10-CM | POA: Insufficient documentation

## 2019-11-04 DIAGNOSIS — W208XXA Other cause of strike by thrown, projected or falling object, initial encounter: Secondary | ICD-10-CM | POA: Insufficient documentation

## 2019-11-04 DIAGNOSIS — Z7982 Long term (current) use of aspirin: Secondary | ICD-10-CM | POA: Diagnosis not present

## 2019-11-04 DIAGNOSIS — J439 Emphysema, unspecified: Secondary | ICD-10-CM | POA: Diagnosis not present

## 2019-11-04 DIAGNOSIS — S0990XA Unspecified injury of head, initial encounter: Secondary | ICD-10-CM

## 2019-11-04 DIAGNOSIS — F17210 Nicotine dependence, cigarettes, uncomplicated: Secondary | ICD-10-CM | POA: Diagnosis not present

## 2019-11-04 DIAGNOSIS — I129 Hypertensive chronic kidney disease with stage 1 through stage 4 chronic kidney disease, or unspecified chronic kidney disease: Secondary | ICD-10-CM | POA: Insufficient documentation

## 2019-11-04 DIAGNOSIS — N182 Chronic kidney disease, stage 2 (mild): Secondary | ICD-10-CM | POA: Diagnosis not present

## 2019-11-04 DIAGNOSIS — Y929 Unspecified place or not applicable: Secondary | ICD-10-CM | POA: Insufficient documentation

## 2019-11-04 DIAGNOSIS — Z79899 Other long term (current) drug therapy: Secondary | ICD-10-CM | POA: Insufficient documentation

## 2019-11-04 DIAGNOSIS — S199XXA Unspecified injury of neck, initial encounter: Secondary | ICD-10-CM | POA: Diagnosis not present

## 2019-11-04 DIAGNOSIS — M47812 Spondylosis without myelopathy or radiculopathy, cervical region: Secondary | ICD-10-CM | POA: Diagnosis not present

## 2019-11-04 DIAGNOSIS — I2510 Atherosclerotic heart disease of native coronary artery without angina pectoris: Secondary | ICD-10-CM | POA: Diagnosis not present

## 2019-11-04 DIAGNOSIS — M25561 Pain in right knee: Secondary | ICD-10-CM | POA: Diagnosis not present

## 2019-11-04 DIAGNOSIS — J449 Chronic obstructive pulmonary disease, unspecified: Secondary | ICD-10-CM | POA: Insufficient documentation

## 2019-11-04 DIAGNOSIS — M25461 Effusion, right knee: Secondary | ICD-10-CM | POA: Diagnosis not present

## 2019-11-04 DIAGNOSIS — S0081XA Abrasion of other part of head, initial encounter: Secondary | ICD-10-CM | POA: Diagnosis not present

## 2019-11-04 DIAGNOSIS — R911 Solitary pulmonary nodule: Secondary | ICD-10-CM | POA: Diagnosis not present

## 2019-11-04 DIAGNOSIS — Y999 Unspecified external cause status: Secondary | ICD-10-CM | POA: Insufficient documentation

## 2019-11-04 DIAGNOSIS — H748X3 Other specified disorders of middle ear and mastoid, bilateral: Secondary | ICD-10-CM | POA: Diagnosis not present

## 2019-11-04 DIAGNOSIS — Z7951 Long term (current) use of inhaled steroids: Secondary | ICD-10-CM | POA: Insufficient documentation

## 2019-11-04 DIAGNOSIS — J3489 Other specified disorders of nose and nasal sinuses: Secondary | ICD-10-CM | POA: Diagnosis not present

## 2019-11-04 DIAGNOSIS — R519 Headache, unspecified: Secondary | ICD-10-CM | POA: Diagnosis not present

## 2019-11-04 NOTE — Telephone Encounter (Signed)
C/o fall this am around 1030 or 11 am. Swelling noted on forehead over right eye after fall. Dizzy when looking up and tripped over board and hit face and head on building hard enough to put dent. Patient hit nose, cheek, forehead. "knot" on forehead. Family reports patient may have "passed out" a couple of seconds. Ice applied to forehead per patient. Continues to feel dizzy and requesting not to go to ED due to wait time. Instructed patient she should go to UC or ED. UC may possibly send her to ED anyway. Patient agreed to go to ED, wanted to see Mellody Memos. Reassured patient I would notify Dr. Wynetta Emery via chart. Care advise given. Patient verbalized understanding of care advise and to call 911 if symptoms worsen.  Reason for Disposition . Injury (or injuries) that need emergency care  Answer Assessment - Initial Assessment Questions 1. MECHANISM: "How did the fall happen?"     Tripped over a board and felt funny looking up and fell to the left , hit right eye nose and cheek 2. DOMESTIC VIOLENCE AND ELDER ABUSE SCREENING: "Did you fall because someone pushed you or tried to hurt you?" If Yes, ask: "Are you safe now?"     no 3. ONSET: "When did the fall happen?" (e.g., minutes, hours, or days ago)     1030 or 11 this am. 4. LOCATION: "What part of the body hit the ground?" (e.g., back, buttocks, head, hips, knees, hands, head, stomach)     Face, head, nose, cheek 5. INJURY: "Did you hurt (injure) yourself when you fell?" If Yes, ask: "What did you injure? Tell me more about this?" (e.g., body area; type of injury; pain severity)"     Yes. Hit right side of forehead and now swollen over right eye. Nose bled. 6. PAIN: "Is there any pain?" If Yes, ask: "How bad is the pain?" (e.g., Scale 1-10; or mild,  moderate, severe)   - NONE (0): no pain   - MILD (1-3): doesn't interfere with normal activities    - MODERATE (4-7): interferes with normal activities or awakens from sleep    - SEVERE (8-10):  excruciating pain, unable to do any normal activities      moderate 7. SIZE: For cuts, bruises, or swelling, ask: "How large is it?" (e.g., inches or centimeters)      Pretty large on forehead over right eye. 8. PREGNANCY: "Is there any chance you are pregnant?" "When was your last menstrual period?"     na 9. OTHER SYMPTOMS: "Do you have any other symptoms?" (e.g., dizziness, fever, weakness; new onset or worsening).      Dizzy, little weak 10. CAUSE: "What do you think caused the fall (or falling)?" (e.g., tripped, dizzy spell)       Tripped and dizzy looking up before fall.  Protocols used: FALLS AND FALLING-A-AH

## 2019-11-04 NOTE — ED Triage Notes (Signed)
Pt reports was outside and fell over a beam. Pt states that her nose started bleeding and her cheek started bleeding and she felt a little whoozy. Pt reports called her MD who told her to come to the ED for a CT scan. Denies LOC

## 2019-11-04 NOTE — ED Provider Notes (Signed)
[facility] Emergency Department Provider Note ____________________________________________   First MD Initiated Contact with Patient 11/04/19 1435     (approximate)  I have reviewed the triage vital signs and the nursing notes.   HISTORY  Chief Complaint No chief complaint on file.  HPI Kristi Clark is a 69 y.o. female with history of chronic medical conditions as listed below presents to the emergency department for treatment and evaluation after a mechanical, questionably syncopal fall while outside helping put up a metal building.  She states that she tripped over a metal beam and started falling.  Person she was helping was able to prevent her from falling onto the ground but she hit her head on the side of the building.  She states that she does not think she passed out, however the person that was with her stated that she did pass out for just a second.  She also complains of right knee pain.  No alleviating measures attempted prior to arrival.  She contacted her 40 office who directed her to come to the emergency department for CT.Marland Kitchen         Past Medical History:  Diagnosis Date  . COPD (chronic obstructive pulmonary disease) ([place])   . Depression   . GERD (gastroesophageal reflux disease)   . Hyperlipidemia   . Hypertension   . Menopause   . Osteopenia   . Tobacco abuse     Patient Active Problem List   Diagnosis Date Noted  . Cough 10/29/2019  . Tobacco abuse 10/17/2017  . IBS (irritable bowel syndrome) 04/04/2017  . Advanced care planning/counseling discussion 10/03/2016  . CAD (coronary artery disease) 10/03/2016  . History of anemia 10/03/2016  . Benign hypertension with chronic kidney disease 03/06/2015  . Allergic rhinitis 09/03/2014  . 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 GERD (gastroesophageal reflux disease) 09/03/2014  . Hyperlipidemia 09/03/2014  . Anemia 07/14/2014  . COPD, severe ([place])  08/08/2013  . Pulmonary nodules 08/08/2013    Past Surgical History:  Procedure Laterality Date  . ABDOMINAL HYSTERECTOMY    . CARDIAC CATHETERIZATION    . CHOLECYSTECTOMY  2014  . COLONOSCOPY WITH PROPOFOL N/A 01/16/2018   Procedure: COLONOSCOPY WITH PROPOFOL;  Surgeon: Lollie Sails, MD;  Location: [facility] ENDOSCOPY;  Service: Endoscopy;  Laterality: N/A;  . ESOPHAGOGASTRODUODENOSCOPY (EGD) WITH PROPOFOL N/A 01/16/2018   Procedure: ESOPHAGOGASTRODUODENOSCOPY (EGD) WITH PROPOFOL;  Surgeon: Lollie Sails, MD;  Location: [facility] ENDOSCOPY;  Service: Endoscopy;  Laterality: N/A;  . heart stent      Prior to Admission medications   Medication Sig Start Date End Date Taking? Authorizing Provider  albuterol (VENTOLIN HFA) 108 (90 Base) MCG/ACT inhaler Inhale 2 puffs into the lungs every 6 (six) hours as needed for wheezing or shortness of breath. 03/18/19   Volney American, PA-C  aspirin 81 MG tablet Take 81 mg by mouth daily.    [provider]  atorvastatin (LIPITOR) 40 MG tablet Take 1 tablet (40 mg total) by mouth daily. 10/21/19   Volney American, PA-C  Calcium Carbonate (CALCIUM 600 PO) Take by mouth daily.    [provider]  Cetirizine HCl (ZYRTEC PO) Take by mouth daily.    [provider]  clopidogrel (PLAVIX) 75 MG tablet Take 1 tablet (75 mg total) by mouth daily. 10/21/19   Volney American, PA-C  fluticasone [facility]) 50 MCG/ACT nasal spray Place 2 sprays into both nostrils daily. 10/21/19   Volney American,  PA-C  Fluticasone-Salmeterol (ADVAIR) 250-50 MCG/DOSE AEPB Inhale 1 puff into the lungs 2 (two) times daily. 10/08/19   Eulogio Bear, NP  hydrochlorothiazide (HYDRODIURIL) 25 MG tablet Take 1 tablet (25 mg total) by mouth daily. 10/21/19   Volney American, PA-C  isosorbide mononitrate (IMDUR) 30 MG 24 hr tablet Take 1 tablet (30 mg total) by mouth daily. 10/21/19   Volney American, PA-C  losartan (COZAAR)  100 MG tablet Take 1 tablet (100 mg total) by mouth daily. 10/21/19   Volney American, PA-C  metoprolol succinate (TOPROL-XL) 50 MG 24 hr tablet Take 1 tablet (50 mg total) by mouth daily. Take with or immediately following a meal. 10/21/19   Volney American, PA-C  Multiple Vitamins-Minerals (CENTRUM SILVER ADULT 50+ PO) Take 1 tablet by mouth daily.    [provider]  pantoprazole (PROTONIX) 40 MG tablet Take 1 tablet (40 mg total) by mouth daily. 10/21/19   Volney American, PA-C  predniSONE (DELTASONE) 10 MG tablet Take 6 tabs day one, 5 tabs day two, 4 tabs day three, etc 11/01/19   Volney American, PA-C  Probiotic Product (PROBIOTIC-10 PO) Take by mouth. Every other day    [provider]  tiotropium (SPIRIVA) 18 MCG inhalation capsule Place 1 capsule (18 mcg total) into inhaler and inhale daily. 09/05/19   Volney American, PA-C  valACYclovir (VALTREX) 1000 MG tablet Take 1 tablet (1,000 mg total) by mouth 3 (three) times daily. 11/01/19   Volney American, PA-C    Allergies Clindamycin/lincomycin, Amoxicillin, Avelox [moxifloxacin hcl in nacl], Codeine sulfate, Doxycycline, Penicillins, and Sulfa antibiotics  Family History  Problem Relation Age of Onset  . Cancer Mother   . Stroke Mother   . Heart disease Father   . Hyperlipidemia Father   . Cancer Sister   . Diabetes Sister   . Breast cancer Sister 44  . Diabetes Brother   . Asthma Son   . Cancer Son   . Diabetes Daughter   . Hypertension Daughter   . Cancer Maternal Grandmother        gallbladder  . Diabetes Brother   . Heart disease Brother   . Breast cancer Paternal Aunt     Social History Social History   Tobacco Use  . Smoking status: Current Every Day Smoker    Packs/day: 2.00    Years: 56.00    Pack years: 112.00    Types: Cigarettes  . Smokeless tobacco: Never Used  . Tobacco comment: has the gum and patch.   Vaping Use  . Vaping Use: Never used    Substance Use Topics  . Alcohol use: No  . Drug use: No    Review of Systems  Constitutional: No fever/chills Eyes: No visual changes. ENT: No sore throat. Cardiovascular: Denies chest pain. Respiratory: Denies shortness of breath. Gastrointestinal: No abdominal pain.  No nausea, no vomiting.  No diarrhea.  No constipation. Genitourinary: Negative for dysuria. Musculoskeletal: Positive for right knee pain. Skin: Positive for hematoma on right forehead and abrasions on face. Neurological: Negative for headaches, focal weakness or numbness. ____________________________________________   PHYSICAL EXAM:  VITAL SIGNS: ED Triage Vitals  Enc Vitals Group     BP 11/04/19 1417 (!) 144/54     Pulse Rate 11/04/19 1417 81     Resp 11/04/19 1417 17     Temp 11/04/19 1417 98.4 F (36.9 C)     Temp Source 11/04/19 1417 Oral     SpO2  11/04/19 1417 96 %     Weight 11/04/19 1406 115 lb (52.2 kg)     Height 11/04/19 1406 4\' 11"  (1.499 m)     Head Circumference --      Peak Flow --      Pain Score 11/04/19 1406 4     Pain Loc --      Pain Edu? --      Excl. in [place]? --     Constitutional: Alert and oriented. Well appearing and in no acute distress. Eyes: Conjunctivae are normal. EOMI. Head: Forehead hematoma. Nose: No congestion/rhinnorhea. Mouth/Throat: Mucous membranes are moist.  Oropharynx non-erythematous. Neck: No stridor.   Hematological/Lymphatic/Immunilogical: No cervical lymphadenopathy. Cardiovascular: Normal rate, regular rhythm. Grossly normal heart sounds.  Good peripheral circulation. Respiratory: Normal respiratory effort.  No retractions. Lungs CTAB. Gastrointestinal: Soft and nontender. No distention. No abdominal bruits. No CVA tenderness. Genitourinary:  Musculoskeletal: FROM of right knee. Diffuse "soreness" over patella. Neurologic:  Normal speech and language. No gross focal neurologic deficits are appreciated. No gait instability. Skin:  Skin is warm, dry  and intact. No rash noted. Psychiatric: Mood and affect are normal. Speech and behavior are normal.  ____________________________________________   LABS (all labs ordered are listed, but only abnormal results are displayed)  Labs Reviewed - No data to display ____________________________________________  EKG  Not indicated. ____________________________________________  RADIOLOGY  ED MD interpretation:    X-ray image of the right knee negative for acute findings.  I, Sherrie George, personally viewed and evaluated these images (plain radiographs) as part of my medical decision making, as well as reviewing the written report by the radiologist.  CT of the head, face, cervical spine is negative for acute findings per radiology.  Official radiology report(s): CT Head Wo Contrast  Result Date: 11/04/2019 CLINICAL DATA:  Head trauma, minor. Neck trauma. Maxillofacial pain. Additional provided: Fall hitting nose and cheek, patient on blood thinners. EXAM: CT HEAD WITHOUT CONTRAST CT MAXILLOFACIAL WITHOUT CONTRAST CT CERVICAL SPINE WITHOUT CONTRAST TECHNIQUE: Multidetector CT imaging of the head, cervical spine, and maxillofacial structures were performed using the standard protocol without intravenous contrast. Multiplanar CT image reconstructions of the cervical spine and maxillofacial structures were also generated. COMPARISON:  Prior head CT 07/29/2014. FINDINGS: CT HEAD FINDINGS Brain: Cerebral volume is normal for age. There is no acute intracranial hemorrhage. No demarcated cortical infarct. No extra-axial fluid collection. No evidence of intracranial mass. No midline shift. Vascular: No hyperdense vessel.  Atherosclerotic calcifications. Skull: Normal. Negative for fracture or focal lesion. Other: Trace fluid within bilateral mastoid air cells. CT MAXILLOFACIAL FINDINGS Osseous: No acute maxillofacial fracture is identified. Orbits: Unremarkable. The globes are normal in size and contour.  The extraocular muscles and optic nerve sheath complexes are symmetric and unremarkable. Sinuses: Minimal ethmoid sinus mucosal thickening. Soft tissues: No significant maxillofacial soft tissue swelling is appreciable by CT. CT CERVICAL SPINE FINDINGS Alignment: No significant spondylolisthesis. Skull base and vertebrae: The basion-dental and atlanto-dental intervals are maintained.No evidence of acute fracture to the cervical spine. Soft tissues and spinal canal: No prevertebral fluid or swelling. No visible canal hematoma. Disc levels: Mild for age cervical spondylosis. No high-grade bony spinal canal or neural foraminal narrowing. Upper chest: No visible pneumothorax. Emphysema. Additional background pulmonary nodularity within the imaged lung apices more fully characterized on the chest CT of 10/31/2019. Please refer to this prior examination for further description. IMPRESSION: CT head: 1. No evidence of acute intracranial abnormality. 2. Trace bilateral mastoid effusions. CT maxillofacial: 1. No  acute maxillofacial fracture. 2. Minimal ethmoid sinus mucosal thickening. CT cervical spine: 1. No evidence of acute fracture to the cervical spine. 2. Mild for age cervical spondylosis. 3. Emphysema and pulmonary nodularity within the imaged lung apices, more fully characterized on the chest CT of 10/31/2019. Please refer to this prior examination for further description. Electronically Signed   By: Kellie Simmering DO   On: 11/04/2019 16:39   CT Cervical Spine Wo Contrast  Result Date: 11/04/2019 CLINICAL DATA:  Head trauma, minor. Neck trauma. Maxillofacial pain. Additional provided: Fall hitting nose and cheek, patient on blood thinners. EXAM: CT HEAD WITHOUT CONTRAST CT MAXILLOFACIAL WITHOUT CONTRAST CT CERVICAL SPINE WITHOUT CONTRAST TECHNIQUE: Multidetector CT imaging of the head, cervical spine, and maxillofacial structures were performed using the standard protocol without intravenous contrast. Multiplanar  CT image reconstructions of the cervical spine and maxillofacial structures were also generated. COMPARISON:  Prior head CT 07/29/2014. FINDINGS: CT HEAD FINDINGS Brain: Cerebral volume is normal for age. There is no acute intracranial hemorrhage. No demarcated cortical infarct. No extra-axial fluid collection. No evidence of intracranial mass. No midline shift. Vascular: No hyperdense vessel.  Atherosclerotic calcifications. Skull: Normal. Negative for fracture or focal lesion. Other: Trace fluid within bilateral mastoid air cells. CT MAXILLOFACIAL FINDINGS Osseous: No acute maxillofacial fracture is identified. Orbits: Unremarkable. The globes are normal in size and contour. The extraocular muscles and optic nerve sheath complexes are symmetric and unremarkable. Sinuses: Minimal ethmoid sinus mucosal thickening. Soft tissues: No significant maxillofacial soft tissue swelling is appreciable by CT. CT CERVICAL SPINE FINDINGS Alignment: No significant spondylolisthesis. Skull base and vertebrae: The basion-dental and atlanto-dental intervals are maintained.No evidence of acute fracture to the cervical spine. Soft tissues and spinal canal: No prevertebral fluid or swelling. No visible canal hematoma. Disc levels: Mild for age cervical spondylosis. No high-grade bony spinal canal or neural foraminal narrowing. Upper chest: No visible pneumothorax. Emphysema. Additional background pulmonary nodularity within the imaged lung apices more fully characterized on the chest CT of 10/31/2019. Please refer to this prior examination for further description. IMPRESSION: CT head: 1. No evidence of acute intracranial abnormality. 2. Trace bilateral mastoid effusions. CT maxillofacial: 1. No acute maxillofacial fracture. 2. Minimal ethmoid sinus mucosal thickening. CT cervical spine: 1. No evidence of acute fracture to the cervical spine. 2. Mild for age cervical spondylosis. 3. Emphysema and pulmonary nodularity within the imaged  lung apices, more fully characterized on the chest CT of 10/31/2019. Please refer to this prior examination for further description. Electronically Signed   By: Kellie Simmering DO   On: 11/04/2019 16:39   DG Knee Complete 4 Views Right  Result Date: 11/04/2019 CLINICAL DATA:  Status post fall. EXAM: RIGHT KNEE - COMPLETE 4+ VIEW COMPARISON:  None. FINDINGS: No evidence of acute fracture or dislocation. No evidence of arthropathy or other focal bone abnormality. A small joint effusion is noted. IMPRESSION: 1. No acute osseous abnormality. 2. Small joint effusion. Electronically Signed   By: Virgina Norfolk M.D.   On: 11/04/2019 16:47   CT Maxillofacial Wo Contrast  Result Date: 11/04/2019 CLINICAL DATA:  Head trauma, minor. Neck trauma. Maxillofacial pain. Additional provided: Fall hitting nose and cheek, patient on blood thinners. EXAM: CT HEAD WITHOUT CONTRAST CT MAXILLOFACIAL WITHOUT CONTRAST CT CERVICAL SPINE WITHOUT CONTRAST TECHNIQUE: Multidetector CT imaging of the head, cervical spine, and maxillofacial structures were performed using the standard protocol without intravenous contrast. Multiplanar CT image reconstructions of the cervical spine and maxillofacial structures were also generated. COMPARISON:  Prior head CT 07/29/2014. FINDINGS: CT HEAD FINDINGS Brain: Cerebral volume is normal for age. There is no acute intracranial hemorrhage. No demarcated cortical infarct. No extra-axial fluid collection. No evidence of intracranial mass. No midline shift. Vascular: No hyperdense vessel.  Atherosclerotic calcifications. Skull: Normal. Negative for fracture or focal lesion. Other: Trace fluid within bilateral mastoid air cells. CT MAXILLOFACIAL FINDINGS Osseous: No acute maxillofacial fracture is identified. Orbits: Unremarkable. The globes are normal in size and contour. The extraocular muscles and optic nerve sheath complexes are symmetric and unremarkable. Sinuses: Minimal ethmoid sinus mucosal  thickening. Soft tissues: No significant maxillofacial soft tissue swelling is appreciable by CT. CT CERVICAL SPINE FINDINGS Alignment: No significant spondylolisthesis. Skull base and vertebrae: The basion-dental and atlanto-dental intervals are maintained.No evidence of acute fracture to the cervical spine. Soft tissues and spinal canal: No prevertebral fluid or swelling. No visible canal hematoma. Disc levels: Mild for age cervical spondylosis. No high-grade bony spinal canal or neural foraminal narrowing. Upper chest: No visible pneumothorax. Emphysema. Additional background pulmonary nodularity within the imaged lung apices more fully characterized on the chest CT of 10/31/2019. Please refer to this prior examination for further description. IMPRESSION: CT head: 1. No evidence of acute intracranial abnormality. 2. Trace bilateral mastoid effusions. CT maxillofacial: 1. No acute maxillofacial fracture. 2. Minimal ethmoid sinus mucosal thickening. CT cervical spine: 1. No evidence of acute fracture to the cervical spine. 2. Mild for age cervical spondylosis. 3. Emphysema and pulmonary nodularity within the imaged lung apices, more fully characterized on the chest CT of 10/31/2019. Please refer to this prior examination for further description. Electronically Signed   By: Kellie Simmering DO   On: 11/04/2019 16:39    ____________________________________________   PROCEDURES  Procedure(s) performed (including Critical Care):  Procedures  ____________________________________________   INITIAL IMPRESSION / ASSESSMENT AND PLAN     69 year old female presenting to the emergency department for treatment and evaluation after mechanical, questionably syncopal fall while assisting someone put up a metal building.  See HPI for further details.  Plan will be to get a CT of her head, cervical spine, and maxillofacial bones.  Currently she is alert, oriented, and states that she feels sore but otherwise normal.   She also complains of knee pain which we will get an x-ray of.   ED COURSE  Images of the head, cervical spine, maxillofacial bones, and right knee are all negative for acute findings.  Patient felt reassured.  She will be discharged home with head injury instructions as well as home care for musculoskeletal pain.  She is to take Tylenol as needed.  She is to follow-up with her primary care provider for symptoms of change or worsen or for new concerns.    ___________________________________________   FINAL CLINICAL IMPRESSION(S) / ED DIAGNOSES  Final diagnoses:  Acute pain of right knee  Minor head injury, initial encounter  Abrasion, face w/o infection     ED Discharge Orders    None       Kristi Clark was evaluated in Emergency Department on 11/04/2019 for the symptoms described in the history of present illness. She was evaluated in the context of the global COVID-19 pandemic, which necessitated consideration that the patient might be at risk for infection with the SARS-CoV-2 virus that causes COVID-19. Institutional protocols and algorithms that pertain to the evaluation of patients at risk for COVID-19 are in a state of rapid change based on information released by regulatory bodies including the CDC and federal  and state organizations. These policies and algorithms were followed during the patient's care in the ED.   Note:  This document was prepared using Dragon voice recognition software and may include unintentional dictation errors.   Victorino Dike, FNP 11/04/19 1743    Carrie Mew, MD 11/05/19 2027

## 2019-11-04 NOTE — Telephone Encounter (Signed)
Agree with going to ED. Needs CT.

## 2019-11-04 NOTE — Discharge Instructions (Signed)
Take tylenol if needed for pain.  Follow up with primary care if not improving over the week.  Return to the ER for symptoms that change or worsen if unable to schedule an appointment.

## 2019-11-04 NOTE — ED Notes (Signed)
See triage note  Presents s/p fall  States tripped over beam and fell  Hitting nose and cheek

## 2019-11-04 NOTE — Telephone Encounter (Signed)
Pt is in ED at the moment per Epic.

## 2019-11-05 DIAGNOSIS — R918 Other nonspecific abnormal finding of lung field: Secondary | ICD-10-CM | POA: Diagnosis not present

## 2019-11-05 DIAGNOSIS — R05 Cough: Secondary | ICD-10-CM | POA: Diagnosis not present

## 2019-11-06 ENCOUNTER — Other Ambulatory Visit: Payer: Self-pay | Admitting: Specialist

## 2019-11-06 DIAGNOSIS — R918 Other nonspecific abnormal finding of lung field: Secondary | ICD-10-CM | POA: Diagnosis not present

## 2019-11-06 DIAGNOSIS — R05 Cough: Secondary | ICD-10-CM | POA: Diagnosis not present

## 2019-11-08 ENCOUNTER — Ambulatory Visit: Payer: Self-pay

## 2019-11-08 DIAGNOSIS — R05 Cough: Secondary | ICD-10-CM | POA: Diagnosis not present

## 2019-11-08 DIAGNOSIS — R0602 Shortness of breath: Secondary | ICD-10-CM | POA: Diagnosis not present

## 2019-11-08 MED ORDER — ONDANSETRON 4 MG PO TBDP: 4.0000 mg | ORAL_TABLET | Freq: Three times a day (TID) | ORAL | 0 refills | Status: DC | PRN

## 2019-11-08 NOTE — Telephone Encounter (Signed)
Patient notified and verbalized understanding. 

## 2019-11-08 NOTE — Telephone Encounter (Signed)
Zofran called in to help with the nausea and vomiting. Make sure to take valtrex with food and eat a gentle diet. Let me know if she's not feeling better. With that.

## 2019-11-08 NOTE — Telephone Encounter (Signed)
Pt. Reports she has had abdominal pain since she started the Valtrex for shingles. Yesterday it was worse with cramping. Vomited x 1. Has difficulty with constipation. Wants to know what she can take safely with her medications. Feels tired and weak this morning. No availability for virtual appointment today.States she is "drinking water so I don't get dehydrated." Please advise pt.  Reason for Disposition  [1] MODERATE pain (e.g., interferes with normal activities) AND [2] pain comes and goes (cramps) AND [3] present > 24 hours  (Exception: pain with Vomiting or Diarrhea - see that Guideline)  Answer Assessment - Initial Assessment Questions 1. LOCATION: "Where does it hurt?"      Yesterday - Low under belly button 2. RADIATION: "Does the pain shoot anywhere else?" (e.g., chest, back)     Back 3. ONSET: "When did the pain begin?" (e.g., minutes, hours or days ago)      Yesterday 4. SUDDEN: "Gradual or sudden onset?"     Gradual 5. PATTERN "Does the pain come and go, or is it constant?"    - If constant: "Is it getting better, staying the same, or worsening?"      (Note: Constant means the pain never goes away completely; most serious pain is constant and it progresses)     - If intermittent: "How long does it last?" "Do you have pain now?"     (Note: Intermittent means the pain goes away completely between bouts)     Comes and goes 6. SEVERITY: "How bad is the pain?"  (e.g., Scale 1-10; mild, moderate, or severe)   - MILD (1-3): doesn't interfere with normal activities, abdomen soft and not tender to touch    - MODERATE (4-7): interferes with normal activities or awakens from sleep, tender to touch    - SEVERE (8-10): excruciating pain, doubled over, unable to do any normal activities      Last night - 10 7. RECURRENT SYMPTOM: "Have you ever had this type of stomach pain before?" If Yes, ask: "When was the last time?" and "What happened that time?"      Yes 8. CAUSE: "What do you think  is causing the stomach pain?"     Unsure - maybe her Valtrex 9. RELIEVING/AGGRAVATING FACTORS: "What makes it better or worse?" (e.g., movement, antacids, bowel movement)     No 10. OTHER SYMPTOMS: "Has there been any vomiting, diarrhea, constipation, or urine problems?"       Constipation 11. PREGNANCY: "Is there any chance you are pregnant?" "When was your last menstrual period?"       No  Protocols used: ABDOMINAL PAIN - [facility]

## 2019-11-11 ENCOUNTER — Encounter: Payer: Self-pay | Admitting: Family Medicine

## 2019-11-12 DIAGNOSIS — R05 Cough: Secondary | ICD-10-CM | POA: Diagnosis not present

## 2019-11-12 DIAGNOSIS — R918 Other nonspecific abnormal finding of lung field: Secondary | ICD-10-CM | POA: Diagnosis not present

## 2019-11-13 NOTE — Telephone Encounter (Signed)
Pt called in regarding getting medication for thrush. She states that she feels that her mouth is on fire, white tongue, and red and bumpy spots in her mouth. Please advise.

## 2019-11-18 ENCOUNTER — Other Ambulatory Visit: Payer: Self-pay

## 2019-11-18 ENCOUNTER — Ambulatory Visit: Payer: Medicare Other | Admitting: Family Medicine

## 2019-11-18 ENCOUNTER — Encounter: Payer: Self-pay | Admitting: Family Medicine

## 2019-11-18 ENCOUNTER — Ambulatory Visit (INDEPENDENT_AMBULATORY_CARE_PROVIDER_SITE_OTHER): Payer: Medicare Other | Admitting: Family Medicine

## 2019-11-18 VITALS — BP 102/57 | HR 74 | Temp 98.2°F | Wt 115.0 lb

## 2019-11-18 DIAGNOSIS — I952 Hypotension due to drugs: Secondary | ICD-10-CM

## 2019-11-18 DIAGNOSIS — D649 Anemia, unspecified: Secondary | ICD-10-CM | POA: Diagnosis not present

## 2019-11-18 DIAGNOSIS — J449 Chronic obstructive pulmonary disease, unspecified: Secondary | ICD-10-CM

## 2019-11-18 DIAGNOSIS — N182 Chronic kidney disease, stage 2 (mild): Secondary | ICD-10-CM | POA: Diagnosis not present

## 2019-11-18 DIAGNOSIS — K219 Gastro-esophageal reflux disease without esophagitis: Secondary | ICD-10-CM

## 2019-11-18 DIAGNOSIS — Z23 Encounter for immunization: Secondary | ICD-10-CM

## 2019-11-18 DIAGNOSIS — B0229 Other postherpetic nervous system involvement: Secondary | ICD-10-CM | POA: Diagnosis not present

## 2019-11-18 DIAGNOSIS — I2510 Atherosclerotic heart disease of native coronary artery without angina pectoris: Secondary | ICD-10-CM

## 2019-11-18 DIAGNOSIS — N289 Disorder of kidney and ureter, unspecified: Secondary | ICD-10-CM | POA: Diagnosis not present

## 2019-11-18 DIAGNOSIS — E0590 Thyrotoxicosis, unspecified without thyrotoxic crisis or storm: Secondary | ICD-10-CM | POA: Diagnosis not present

## 2019-11-18 DIAGNOSIS — I129 Hypertensive chronic kidney disease with stage 1 through stage 4 chronic kidney disease, or unspecified chronic kidney disease: Secondary | ICD-10-CM

## 2019-11-18 DIAGNOSIS — E785 Hyperlipidemia, unspecified: Secondary | ICD-10-CM

## 2019-11-18 DIAGNOSIS — R7989 Other specified abnormal findings of blood chemistry: Secondary | ICD-10-CM | POA: Diagnosis not present

## 2019-11-18 LAB — URINALYSIS, ROUTINE W REFLEX MICROSCOPIC
Bilirubin, UA: NEGATIVE
Glucose, UA: NEGATIVE
Ketones, UA: NEGATIVE
Leukocytes,UA: NEGATIVE
Nitrite, UA: NEGATIVE
Protein,UA: NEGATIVE
RBC, UA: NEGATIVE
Specific Gravity, UA: 1.01 (ref 1.005–1.030)
Urobilinogen, Ur: 0.2 mg/dL (ref 0.2–1.0)
pH, UA: 7 (ref 5.0–7.5)

## 2019-11-18 MED ORDER — LOSARTAN POTASSIUM 50 MG PO TABS
50.0000 mg | ORAL_TABLET | Freq: Every day | ORAL | 1 refills | Status: DC
Start: 2019-11-18 — End: 2020-04-20

## 2019-11-18 MED ORDER — NORTRIPTYLINE HCL 10 MG PO CAPS: 10.0000 mg | ORAL_CAPSULE | Freq: Every day | ORAL | 3 refills | Status: DC

## 2019-11-18 NOTE — Patient Instructions (Signed)

## 2019-11-18 NOTE — Progress Notes (Signed)
BP (!) 102/57   Pulse 74   Temp 98.2 F (36.8 C) (Oral)   Wt 115 lb (52.2 kg)   LMP  (LMP Unknown)   SpO2 96%   BMI 23.23 kg/m    Subjective:    Patient ID: Kristi Clark, female    DOB: [birth year], 69 y.o.   MRN: [id number]  HPI: Kristi Clark is a 69 y.o. female  Chief Complaint  Patient presents with  . Hyperlipidemia  . Hypertension   Had shingles continues with some pain in her back. She was really sick when she was on the valtrex. She was really constipated and had a lot of abdominal pain as well as chills and sweats and diarrhea. She has been having abdominal pain. She started miralax for her constipation and it has helped now.   Has an infection that Dr. Raul Del found. She has been following with him. She has not heard about her lung infection.   HYPERTENSION / HYPERLIPIDEMIA Satisfied with current treatment? yes Duration of hypertension: chronic BP monitoring frequency: not checking BP medication side effects: no Past BP meds: losartan, metoprolol, imdur, HCTZ Duration of hyperlipidemia: chronic Cholesterol medication side effects: no Cholesterol supplements: none Past cholesterol medications: atrovastatin Medication compliance: excellent compliance Aspirin: yes Recent stressors: no Recurrent headaches: no Visual changes: no Palpitations: no Dyspnea: no Chest pain: yes Lower extremity edema: no Dizzy/lightheaded: yes  COPD COPD status: stable Satisfied with current treatment?: yes Oxygen use: no Dyspnea frequency: in the heat Cough frequency: in the heat Rescue inhaler frequency:  rarely Limitation of activity: yes Pneumovax: Up to Date Influenza: Up to Date  ANEMIA Anemia status: controlled Compliance with treatment: excellent compliance Iron supplementation side effects: no Severity of anemia: mild Fatigue: no Decreased exercise tolerance: no  Dyspnea on exertion: no Palpitations: no Bleeding: no Pica: no   Relevant past  medical, surgical, family and social history reviewed and updated as indicated. Interim medical history since our last visit reviewed. Allergies and medications reviewed and updated.  Review of Systems  Constitutional: Negative.   Respiratory: Negative.   Cardiovascular: Negative.   Gastrointestinal: Negative.   Musculoskeletal: Negative.   Neurological: Negative.   Psychiatric/Behavioral: Negative.     Per HPI unless specifically indicated above     Objective:    BP (!) 102/57   Pulse 74   Temp 98.2 F (36.8 C) (Oral)   Wt 115 lb (52.2 kg)   LMP  (LMP Unknown)   SpO2 96%   BMI 23.23 kg/m   Wt Readings from Last 3 Encounters:  11/18/19 115 lb (52.2 kg)  11/04/19 115 lb (52.2 kg)  11/01/19 115 lb 9.6 oz (52.4 kg)    Physical Exam Vitals and nursing note reviewed.  Constitutional:      General: She is not in acute distress.    Appearance: Normal appearance. She is not ill-appearing, toxic-appearing or diaphoretic.  HENT:     Head: Normocephalic and atraumatic.     Right Ear: External ear normal.     Left Ear: External ear normal.     Nose: Nose normal.     Mouth/Throat:     Mouth: Mucous membranes are moist.     Pharynx: Oropharynx is clear.  Eyes:     General: No scleral icterus.       Right eye: No discharge.        Left eye: No discharge.     Extraocular Movements: Extraocular movements intact.     Conjunctiva/sclera: Conjunctivae  normal.     Pupils: Pupils are equal, round, and reactive to light.  Cardiovascular:     Rate and Rhythm: Normal rate and regular rhythm.     Pulses: Normal pulses.     Heart sounds: Normal heart sounds. No murmur heard.  No friction rub. No gallop.   Pulmonary:     Effort: Pulmonary effort is normal. No respiratory distress.     Breath sounds: Normal breath sounds. No stridor. No wheezing, rhonchi or rales.  Chest:     Chest wall: No tenderness.  Musculoskeletal:        General: Normal range of motion.     Cervical back:  Normal range of motion and neck supple.  Skin:    General: Skin is warm and dry.     Capillary Refill: Capillary refill takes less than 2 seconds.     Coloration: Skin is not jaundiced or pale.     Findings: No bruising, erythema, lesion or rash.  Neurological:     General: No focal deficit present.     Mental Status: She is alert and oriented to person, place, and time. Mental status is at baseline.  Psychiatric:        Mood and Affect: Mood normal.        Behavior: Behavior normal.        Thought Content: Thought content normal.        Judgment: Judgment normal.     Results for orders placed or performed in visit on 11/18/19  Comprehensive metabolic panel  Result Value Ref Range   Glucose 87 65 - 99 mg/dL   BUN 15 8 - 27 mg/dL   Creatinine, Ser 1.07 (H) 0.57 - 1.00 mg/dL   GFR calc non Af Amer 53 (L) >59 mL/min/1.73   GFR calc Af Amer 61 >59 mL/min/1.73   BUN/Creatinine Ratio 14 12 - 28   Sodium 133 (L) 134 - 144 mmol/L   Potassium 3.7 3.5 - 5.2 mmol/L   Chloride 94 (L) 96 - 106 mmol/L   CO2 26 20 - 29 mmol/L   Calcium 9.6 8.7 - 10.3 mg/dL   Total Protein 6.2 6.0 - 8.5 g/dL   Albumin 3.9 3.8 - 4.8 g/dL   Globulin, Total 2.3 1.5 - 4.5 g/dL   Albumin/Globulin Ratio 1.7 1.2 - 2.2   Bilirubin Total 0.3 0.0 - 1.2 mg/dL   Alkaline Phosphatase 77 44 - 121 IU/L   AST 20 0 - 40 IU/L   ALT 13 0 - 32 IU/L  Lipid Panel w/o Chol/HDL Ratio  Result Value Ref Range   Cholesterol, Total 116 100 - 199 mg/dL   Triglycerides 96 0 - 149 mg/dL   HDL 51 >39 mg/dL   VLDL Cholesterol Cal 18 5 - 40 mg/dL   LDL Chol Calc (NIH) 47 0 - 99 mg/dL  CBC with Differential/Platelet  Result Value Ref Range   WBC 6.8 3.4 - 10.8 x10E3/uL   RBC 3.39 (L) 3.77 - 5.28 x10E6/uL   Hemoglobin 10.9 (L) 11.1 - 15.9 g/dL   Hematocrit 32.3 (L) 34.0 - 46.6 %   MCV 95 79 - 97 fL   MCH 32.2 26.6 - 33.0 pg   MCHC 33.7 31 - 35 g/dL   RDW 13.5 11.7 - 15.4 %   Platelets 209 150 - 450 x10E3/uL   Neutrophils 54 Not  Estab. %   Lymphs 37 Not Estab. %   Monocytes 6 Not Estab. %   Eos 2 Not Estab. %  Basos 1 Not Estab. %   Neutrophils Absolute 3.8 1 - 7 x10E3/uL   Lymphocytes Absolute 2.6 0 - 3 x10E3/uL   Monocytes Absolute 0.4 0 - 0 x10E3/uL   EOS (ABSOLUTE) 0.1 0.0 - 0.4 x10E3/uL   Basophils Absolute 0.0 0 - 0 x10E3/uL   Immature Granulocytes 0 Not Estab. %   Immature Grans (Abs) 0.0 0.0 - 0.1 x10E3/uL  TSH  Result Value Ref Range   TSH 0.310 (L) 0.450 - 4.500 uIU/mL  Urinalysis, Routine w reflex microscopic  Result Value Ref Range   Specific Gravity, UA 1.010 1.005 - 1.030   pH, UA 7.0 5.0 - 7.5   Color, UA Yellow Yellow   Appearance Ur Clear Clear   Leukocytes,UA Negative Negative   Protein,UA Negative Negative/Trace   Glucose, UA Negative Negative   Ketones, UA Negative Negative   RBC, UA Negative Negative   Bilirubin, UA Negative Negative   Urobilinogen, Ur 0.2 0.2 - 1.0 mg/dL   Nitrite, UA Negative Negative  Iron and TIBC  Result Value Ref Range   Total Iron Binding Capacity 248 (L) 250 - 450 ug/dL   UIBC 194 118 - 369 ug/dL   Iron 54 27 - 139 ug/dL   Iron Saturation 22 15 - 55 %  Ferritin  Result Value Ref Range   Ferritin 68 15.0 - 150.0 ng/mL      Assessment & Plan:   Problem List Items Addressed This Visit      Cardiovascular and Mediastinum   Benign hypertension with chronic kidney disease    Over treated. Will cut meds and recheck 2-4 weeks. Call with any concerns.       Relevant Medications   losartan (COZAAR) 50 MG tablet   Other Relevant Orders   Comprehensive metabolic panel (Completed)   TSH (Completed)   Urinalysis, Routine w reflex microscopic (Completed)     Respiratory   COPD, severe ([place])    Under good control on current regimen. Continue current regimen. Continue to monitor. Call with any concerns. Refills given. Continue to follow with pulmonology.        Relevant Orders   Comprehensive metabolic panel (Completed)     Digestive   GERD  (gastroesophageal reflux disease)    Under good control on current regimen. Continue current regimen. Continue to monitor. Call with any concerns. Refills given. Labs drawn today.        Relevant Orders   Comprehensive metabolic panel (Completed)   CBC with Differential/Platelet (Completed)     Nervous and Auditory   Postherpetic neuralgia    Will start nortriptyline and recheck 1 month. Call with any concerns.         Genitourinary   CKD (chronic kidney disease), stage II    Rechecking labs today. Await results. Treat as needed.       Relevant Orders   Comprehensive metabolic panel (Completed)   Urinalysis, Routine w reflex microscopic (Completed)     Other   Anemia    Rechecking labs today. Await results. Treat as needed.       Relevant Orders   Comprehensive metabolic panel (Completed)   CBC with Differential/Platelet (Completed)   Iron and TIBC (Completed)   Ferritin (Completed)   Hyperlipidemia    Under good control on current regimen. Continue current regimen. Continue to monitor. Call with any concerns. Refills given. Labs drawn today.        Relevant Medications   losartan (COZAAR) 50 MG tablet   Other Relevant Orders  Comprehensive metabolic panel (Completed)   Lipid Panel w/o Chol/HDL Ratio (Completed)    Other Visit Diagnoses    Hypotension due to drugs    -  Primary   BP over treated. Will cut meds and recheck 2-4 weeks. Call with any concerns.   Relevant Medications   losartan (COZAAR) 50 MG tablet   Need for influenza vaccination       Flu shot given today.    Relevant Orders   Flu Vaccine QUAD High Dose(Fluad)       Follow up plan: Return in about 4 weeks (around 12/16/2019).

## 2019-11-19 ENCOUNTER — Ambulatory Visit
Admission: RE | Admit: 2019-11-19 | Discharge: 2019-11-19 | Disposition: A | Discharge status: Home / Self Care | Payer: Medicare Other | Source: Ambulatory Visit | Attending: Family Medicine | Admitting: Family Medicine

## 2019-11-19 DIAGNOSIS — Z1231 Encounter for screening mammogram for malignant neoplasm of breast: Secondary | ICD-10-CM | POA: Insufficient documentation

## 2019-11-19 LAB — IRON AND TIBC
Iron Saturation: 22 % (ref 15–55)
Iron: 54 ug/dL (ref 27–139)
Total Iron Binding Capacity: 248 ug/dL — ABNORMAL LOW (ref 250–450)
UIBC: 194 ug/dL (ref 118–369)

## 2019-11-19 LAB — CBC WITH DIFFERENTIAL/PLATELET
Basophils Absolute: 0 10*3/uL (ref 0.0–0.2)
Basos: 1 %
EOS (ABSOLUTE): 0.1 10*3/uL (ref 0.0–0.4)
Eos: 2 %
Hematocrit: 32.3 % — ABNORMAL LOW (ref 34.0–46.6)
Hemoglobin: 10.9 g/dL — ABNORMAL LOW (ref 11.1–15.9)
Immature Grans (Abs): 0 10*3/uL (ref 0.0–0.1)
Immature Granulocytes: 0 %
Lymphocytes Absolute: 2.6 10*3/uL (ref 0.7–3.1)
Lymphs: 37 %
MCH: 32.2 pg (ref 26.6–33.0)
MCHC: 33.7 g/dL (ref 31.5–35.7)
MCV: 95 fL (ref 79–97)
Monocytes Absolute: 0.4 10*3/uL (ref 0.1–0.9)
Monocytes: 6 %
Neutrophils Absolute: 3.8 10*3/uL (ref 1.4–7.0)
Neutrophils: 54 %
Platelets: 209 10*3/uL (ref 150–450)
RBC: 3.39 x10E6/uL — ABNORMAL LOW (ref 3.77–5.28)
RDW: 13.5 % (ref 11.7–15.4)
WBC: 6.8 10*3/uL (ref 3.4–10.8)

## 2019-11-19 LAB — LIPID PANEL W/O CHOL/HDL RATIO
Cholesterol, Total: 116 mg/dL (ref 100–199)
HDL: 51 mg/dL (ref >39)
LDL Chol Calc (NIH): 47 mg/dL (ref 0–99)
Triglycerides: 96 mg/dL (ref 0–149)
VLDL Cholesterol Cal: 18 mg/dL (ref 5–40)

## 2019-11-19 LAB — COMPREHENSIVE METABOLIC PANEL
ALT: 13 IU/L (ref 0–32)
AST: 20 IU/L (ref 0–40)
Albumin/Globulin Ratio: 1.7 (ref 1.2–2.2)
Albumin: 3.9 g/dL (ref 3.8–4.8)
Alkaline Phosphatase: 77 IU/L (ref 44–121)
BUN/Creatinine Ratio: 14 (ref 12–28)
BUN: 15 mg/dL (ref 8–27)
Bilirubin Total: 0.3 mg/dL (ref 0.0–1.2)
CO2: 26 mmol/L (ref 20–29)
Calcium: 9.6 mg/dL (ref 8.7–10.3)
Chloride: 94 mmol/L — ABNORMAL LOW (ref 96–106)
Creatinine, Ser: 1.07 mg/dL — ABNORMAL HIGH (ref 0.57–1.00)
GFR calc Af Amer: 61 mL/min/{1.73_m2} (ref >59)
GFR calc non Af Amer: 53 mL/min/{1.73_m2} — ABNORMAL LOW (ref >59)
Globulin, Total: 2.3 g/dL (ref 1.5–4.5)
Glucose: 87 mg/dL (ref 65–99)
Potassium: 3.7 mmol/L (ref 3.5–5.2)
Sodium: 133 mmol/L — ABNORMAL LOW (ref 134–144)
Total Protein: 6.2 g/dL (ref 6.0–8.5)

## 2019-11-19 LAB — FERRITIN: Ferritin: 68 ng/mL (ref 15–150)

## 2019-11-19 LAB — TSH: TSH: 0.31 u[IU]/mL — ABNORMAL LOW (ref 0.450–4.500)

## 2019-11-22 ENCOUNTER — Encounter: Payer: Self-pay | Admitting: Family Medicine

## 2019-11-24 DIAGNOSIS — B0229 Other postherpetic nervous system involvement: Secondary | ICD-10-CM | POA: Insufficient documentation

## 2019-11-24 NOTE — Assessment & Plan Note (Signed)
Over treated. Will cut meds and recheck 2-4 weeks. Call with any concerns.

## 2019-11-24 NOTE — Assessment & Plan Note (Signed)
Will start nortriptyline and recheck 1 month. Call with any concerns.

## 2019-11-24 NOTE — Assessment & Plan Note (Signed)
Rechecking labs today. Await results. Treat as needed.  °

## 2019-11-24 NOTE — Assessment & Plan Note (Signed)
Under good control on current regimen. Continue current regimen. Continue to monitor. Call with any concerns. Refills given. Continue to follow with pulmonology.  

## 2019-11-24 NOTE — Assessment & Plan Note (Signed)
Under good control on current regimen. Continue current regimen. Continue to monitor. Call with any concerns. Refills given. Labs drawn today.   

## 2019-11-25 ENCOUNTER — Other Ambulatory Visit: Payer: Self-pay | Admitting: Family Medicine

## 2019-11-25 DIAGNOSIS — R7989 Other specified abnormal findings of blood chemistry: Secondary | ICD-10-CM

## 2019-11-25 DIAGNOSIS — N289 Disorder of kidney and ureter, unspecified: Secondary | ICD-10-CM

## 2019-12-12 DIAGNOSIS — I2510 Atherosclerotic heart disease of native coronary artery without angina pectoris: Secondary | ICD-10-CM | POA: Diagnosis not present

## 2019-12-12 DIAGNOSIS — F17210 Nicotine dependence, cigarettes, uncomplicated: Secondary | ICD-10-CM | POA: Diagnosis not present

## 2019-12-12 DIAGNOSIS — J449 Chronic obstructive pulmonary disease, unspecified: Secondary | ICD-10-CM | POA: Diagnosis not present

## 2019-12-12 DIAGNOSIS — E782 Mixed hyperlipidemia: Secondary | ICD-10-CM | POA: Diagnosis not present

## 2019-12-12 DIAGNOSIS — I10 Essential (primary) hypertension: Secondary | ICD-10-CM | POA: Diagnosis not present

## 2019-12-12 DIAGNOSIS — Z9861 Coronary angioplasty status: Secondary | ICD-10-CM | POA: Diagnosis not present

## 2019-12-12 DIAGNOSIS — I340 Nonrheumatic mitral (valve) insufficiency: Secondary | ICD-10-CM | POA: Diagnosis not present

## 2019-12-23 ENCOUNTER — Other Ambulatory Visit: Payer: Self-pay

## 2019-12-23 ENCOUNTER — Encounter: Payer: Self-pay | Admitting: Family Medicine

## 2019-12-23 ENCOUNTER — Ambulatory Visit (INDEPENDENT_AMBULATORY_CARE_PROVIDER_SITE_OTHER): Payer: Medicare Other | Admitting: Family Medicine

## 2019-12-23 VITALS — BP 122/62 | HR 79 | Temp 98.0°F | Wt 115.4 lb

## 2019-12-23 DIAGNOSIS — R7989 Other specified abnormal findings of blood chemistry: Secondary | ICD-10-CM | POA: Diagnosis not present

## 2019-12-23 DIAGNOSIS — I952 Hypotension due to drugs: Secondary | ICD-10-CM | POA: Diagnosis not present

## 2019-12-23 DIAGNOSIS — I2510 Atherosclerotic heart disease of native coronary artery without angina pectoris: Secondary | ICD-10-CM | POA: Diagnosis not present

## 2019-12-23 DIAGNOSIS — Z23 Encounter for immunization: Secondary | ICD-10-CM | POA: Diagnosis not present

## 2019-12-23 DIAGNOSIS — I129 Hypertensive chronic kidney disease with stage 1 through stage 4 chronic kidney disease, or unspecified chronic kidney disease: Secondary | ICD-10-CM | POA: Diagnosis not present

## 2019-12-23 DIAGNOSIS — E0590 Thyrotoxicosis, unspecified without thyrotoxic crisis or storm: Secondary | ICD-10-CM

## 2019-12-23 DIAGNOSIS — N289 Disorder of kidney and ureter, unspecified: Secondary | ICD-10-CM | POA: Diagnosis not present

## 2019-12-23 NOTE — Assessment & Plan Note (Signed)
Under good control on current regimen. Continue current regimen. Continue to monitor. Call with any concerns. Refills given. Labs drawn today.   

## 2019-12-23 NOTE — Progress Notes (Signed)
BP 122/62   Pulse 79   Temp 98 F (36.7 C) (Oral)   Wt 115 lb 6.4 oz (52.3 kg)   LMP  (LMP Unknown)   SpO2 96%   BMI 23.31 kg/m    Subjective:    Patient ID: Kristi Clark, female    DOB: 05-Feb-1951, 69 y.o.   MRN: [id number]  HPI: Kristi Clark is a 69 y.o. female  Chief Complaint  Patient presents with  . Hypotension    4 week f.up   HYPERTENSION Hypertension status: controlled  Satisfied with current treatment? yes Duration of hypertension: chronic BP monitoring frequency:  not checking BP medication side effects:  no Medication compliance: excellent compliance Aspirin: yes Recurrent headaches: no Visual changes: no Palpitations: no Dyspnea: no Chest pain: no Lower extremity edema: yes- after being at [facility] and walking Dizzy/lightheaded: no   Relevant past medical, surgical, family and social history reviewed and updated as indicated. Interim medical history since our last visit reviewed. Allergies and medications reviewed and updated.  Review of Systems  Constitutional: Negative.   Respiratory: Negative.   Cardiovascular: Negative.   Gastrointestinal: Negative.   Musculoskeletal: Negative.   Psychiatric/Behavioral: Negative.     Per HPI unless specifically indicated above     Objective:    BP 122/62   Pulse 79   Temp 98 F (36.7 C) (Oral)   Wt 115 lb 6.4 oz (52.3 kg)   LMP  (LMP Unknown)   SpO2 96%   BMI 23.31 kg/m   Wt Readings from Last 3 Encounters:  12/23/19 115 lb 6.4 oz (52.3 kg)  11/18/19 115 lb (52.2 kg)  11/04/19 115 lb (52.2 kg)    Physical Exam Vitals and nursing note reviewed.  Constitutional:      General: She is not in acute distress.    Appearance: Normal appearance. She is not ill-appearing, toxic-appearing or diaphoretic.  HENT:     Head: Normocephalic and atraumatic.     Right Ear: External ear normal.     Left Ear: External ear normal.     Nose: Nose normal.     Mouth/Throat:     Mouth: Mucous  membranes are moist.     Pharynx: Oropharynx is clear.  Eyes:     General: No scleral icterus.       Right eye: No discharge.        Left eye: No discharge.     Extraocular Movements: Extraocular movements intact.     Conjunctiva/sclera: Conjunctivae normal.     Pupils: Pupils are equal, round, and reactive to light.  Cardiovascular:     Rate and Rhythm: Normal rate and regular rhythm.     Pulses: Normal pulses.     Heart sounds: Normal heart sounds. No murmur heard.  No friction rub. No gallop.   Pulmonary:     Effort: Pulmonary effort is normal. No respiratory distress.     Breath sounds: Normal breath sounds. No stridor. No wheezing, rhonchi or rales.  Chest:     Chest wall: No tenderness.  Musculoskeletal:        General: Normal range of motion.     Cervical back: Normal range of motion and neck supple.  Skin:    General: Skin is warm and dry.     Capillary Refill: Capillary refill takes less than 2 seconds.     Coloration: Skin is not jaundiced or pale.     Findings: No bruising, erythema, lesion or rash.  Neurological:  General: No focal deficit present.     Mental Status: She is alert and oriented to person, place, and time. Mental status is at baseline.  Psychiatric:        Mood and Affect: Mood normal.        Behavior: Behavior normal.        Thought Content: Thought content normal.        Judgment: Judgment normal.     Results for orders placed or performed in visit on 11/18/19  Comprehensive metabolic panel  Result Value Ref Range   Glucose 87 65 - 99 mg/dL   BUN 15 8 - 27 mg/dL   Creatinine, Ser 1.07 (H) 0.57 - 1.00 mg/dL   GFR calc non Af Amer 53 (L) >59 mL/min/1.73   GFR calc Af Amer 61 >59 mL/min/1.73   BUN/Creatinine Ratio 14 12 - 28   Sodium 133 (L) 134 - 144 mmol/L   Potassium 3.7 3.5 - 5.2 mmol/L   Chloride 94 (L) 96 - 106 mmol/L   CO2 26 20 - 29 mmol/L   Calcium 9.6 8.7 - 10.3 mg/dL   Total Protein 6.2 6.0 - 8.5 g/dL   Albumin 3.9 3.8 - 4.8  g/dL   Globulin, Total 2.3 1.5 - 4.5 g/dL   Albumin/Globulin Ratio 1.7 1.2 - 2.2   Bilirubin Total 0.3 0.0 - 1.2 mg/dL   Alkaline Phosphatase 77 44 - 121 IU/L   AST 20 0 - 40 IU/L   ALT 13 0 - 32 IU/L  Lipid Panel w/o Chol/HDL Ratio  Result Value Ref Range   Cholesterol, Total 116 100 - 199 mg/dL   Triglycerides 96 0 - 149 mg/dL   HDL 51 >39 mg/dL   VLDL Cholesterol Cal 18 5 - 40 mg/dL   LDL Chol Calc (NIH) 47 0 - 99 mg/dL  CBC with Differential/Platelet  Result Value Ref Range   WBC 6.8 3.4 - 10.8 x10E3/uL   RBC 3.39 (L) 3.77 - 5.28 x10E6/uL   Hemoglobin 10.9 (L) 11.1 - 15.9 g/dL   Hematocrit 32.3 (L) 34.0 - 46.6 %   MCV 95 79 - 97 fL   MCH 32.2 26.6 - 33.0 pg   MCHC 33.7 31 - 35 g/dL   RDW 13.5 11.7 - 15.4 %   Platelets 209 150 - 450 x10E3/uL   Neutrophils 54 Not Estab. %   Lymphs 37 Not Estab. %   Monocytes 6 Not Estab. %   Eos 2 Not Estab. %   Basos 1 Not Estab. %   Neutrophils Absolute 3.8 1.40 - 7.00 x10E3/uL   Lymphocytes Absolute 2.6 0 - 3 x10E3/uL   Monocytes Absolute 0.4 0 - 0 x10E3/uL   EOS (ABSOLUTE) 0.1 0.0 - 0.4 x10E3/uL   Basophils Absolute 0.0 0 - 0 x10E3/uL   Immature Granulocytes 0 Not Estab. %   Immature Grans (Abs) 0.0 0.0 - 0.1 x10E3/uL  TSH  Result Value Ref Range   TSH 0.310 (L) 0.450 - 4.500 uIU/mL  Urinalysis, Routine w reflex microscopic  Result Value Ref Range   Specific Gravity, UA 1.010 1.005 - 1.030   pH, UA 7.0 5.0 - 7.5   Color, UA Yellow Yellow   Appearance Ur Clear Clear   Leukocytes,UA Negative Negative   Protein,UA Negative Negative/Trace   Glucose, UA Negative Negative   Ketones, UA Negative Negative   RBC, UA Negative Negative   Bilirubin, UA Negative Negative   Urobilinogen, Ur 0.2 0.2 - 1.0 mg/dL   Nitrite, UA  Negative Negative  Iron and TIBC  Result Value Ref Range   Total Iron Binding Capacity 248 (L) 250 - 450 ug/dL   UIBC 194 118 - 369 ug/dL   Iron 54 27 - 139 ug/dL   Iron Saturation 22 15 - 55 %  Ferritin    Result Value Ref Range   Ferritin 68 15.0 - 150.0 ng/mL      Assessment & Plan:   Problem List Items Addressed This Visit      Cardiovascular and Mediastinum   Benign hypertension with chronic kidney disease    Under good control on current regimen. Continue current regimen. Continue to monitor. Call with any concerns. Refills given. Labs drawn today.        Other Visit Diagnoses    Hypotension due to drugs    -  Primary   Resolved. Continue current regimen. Continue to monitor.    Abnormal thyroid blood test       Rechecking labs today. Await results. Treat as needed.    Abnormal kidney function       Rechecking labs today. Await results. Treat as needed.        Follow up plan: Return in about 5 months (around 05/22/2020).

## 2019-12-24 LAB — BASIC METABOLIC PANEL
BUN/Creatinine Ratio: 16 (ref 12–28)
BUN: 14 mg/dL (ref 8–27)
CO2: 27 mmol/L (ref 20–29)
Calcium: 9.5 mg/dL (ref 8.7–10.3)
Chloride: 94 mmol/L — ABNORMAL LOW (ref 96–106)
Creatinine, Ser: 0.89 mg/dL (ref 0.57–1.00)
GFR calc Af Amer: 75 mL/min/{1.73_m2} (ref >59)
GFR calc non Af Amer: 65 mL/min/{1.73_m2} (ref >59)
Glucose: 103 mg/dL — ABNORMAL HIGH (ref 65–99)
Potassium: 3.7 mmol/L (ref 3.5–5.2)
Sodium: 136 mmol/L (ref 134–144)

## 2019-12-24 LAB — TSH: TSH: 0.829 u[IU]/mL (ref 0.450–4.500)

## 2019-12-26 ENCOUNTER — Encounter: Payer: Self-pay | Admitting: Family Medicine

## 2019-12-26 NOTE — Telephone Encounter (Signed)
Copied from [place] [id number]. Topic: General - Other >> Dec 26, 2019  9:24 AM Rainey Pines A wrote: Pt would like callback to go overlab resutls today

## 2020-01-08 DIAGNOSIS — F17210 Nicotine dependence, cigarettes, uncomplicated: Secondary | ICD-10-CM | POA: Diagnosis not present

## 2020-01-08 DIAGNOSIS — R059 Cough, unspecified: Secondary | ICD-10-CM | POA: Diagnosis not present

## 2020-01-08 DIAGNOSIS — R0600 Dyspnea, unspecified: Secondary | ICD-10-CM | POA: Diagnosis not present

## 2020-01-08 DIAGNOSIS — R918 Other nonspecific abnormal finding of lung field: Secondary | ICD-10-CM | POA: Diagnosis not present

## 2020-01-08 DIAGNOSIS — J449 Chronic obstructive pulmonary disease, unspecified: Secondary | ICD-10-CM | POA: Diagnosis not present

## 2020-01-08 DIAGNOSIS — J479 Bronchiectasis, uncomplicated: Secondary | ICD-10-CM | POA: Diagnosis not present

## 2020-02-14 ENCOUNTER — Encounter: Payer: Self-pay | Admitting: Family Medicine

## 2020-02-14 ENCOUNTER — Ambulatory Visit: Payer: Self-pay | Admitting: *Deleted

## 2020-02-14 ENCOUNTER — Telehealth: Payer: Medicare Other | Admitting: Family Medicine

## 2020-02-14 ENCOUNTER — Telehealth (INDEPENDENT_AMBULATORY_CARE_PROVIDER_SITE_OTHER): Payer: Medicare Other | Admitting: Family Medicine

## 2020-02-14 VITALS — Wt 115.0 lb

## 2020-02-14 DIAGNOSIS — I2510 Atherosclerotic heart disease of native coronary artery without angina pectoris: Secondary | ICD-10-CM

## 2020-02-14 DIAGNOSIS — Z20822 Contact with and (suspected) exposure to covid-19: Secondary | ICD-10-CM | POA: Diagnosis not present

## 2020-02-14 MED ORDER — PREDNISONE 50 MG PO TABS: 50.0000 mg | ORAL_TABLET | Freq: Every day | ORAL | 0 refills | Status: DC

## 2020-02-14 NOTE — Telephone Encounter (Signed)
Pt scheduled for apt today pt verbalized understanding

## 2020-02-14 NOTE — Telephone Encounter (Signed)
I returned pt's call.   She is c/o fever 101 last night, body aches, coughing up white mucus, very tired, lower back pain, nasal congestion.   "I just feel so bad and want to sleep all the time".  She got her Covid booster vaccine on 02/06/2020 her symptoms started on 12/8.   I let her know this far out these symptoms were probably not related to the booster shot.   She was with her 55 month old granddaughter last Sunday and she "had a head cold".   Her granddaughter ended up going to the ED for vomiting this week.   Pt does not know if granddaughter was tested for covid or not. Pt has not been tested for covid.   I did go over the isolation instructions with her until she is tested.   "I'm staying home anyway but I'm going out of town next Friday".  There were no MyChart video appts available with any of the providers.  I'm sending in my notes to [facility] high priority for scheduling.  Reason for Disposition . [1] HIGH RISK patient AND [2] influenza exposure within the last 7 days AND [3] ONE OR MORE respiratory symptoms: cough, sore throat, runny or stuffy nose    COVID symptoms.   Granddaughter was sick last Sunday when pt was with her.  Answer Assessment - Initial Assessment Questions 1. COVID-19 DIAGNOSIS: "Who made your Coronavirus (COVID-19) diagnosis?" "Was it confirmed by a positive lab test?" If not diagnosed by a HCP, ask "Are there lots of cases (community spread) where you live?" (See public health department website, if unsure)     I'm feeling bad.  I got covid booster on 02/06/2020.   My granddaughter was with me last weekend.  She is sick vomiting and had to go to the ED.  I'm coughing up mucus.   My low back is hurting.  I have body aches, fever 101 last night it's 98 now.  I'm having chills.  Fatigue too.   My grand daughter went Thurs. Night to ED due to vomiting so went to [facility] in [place].   No appetite her granddaughter.   2. COVID-19 EXPOSURE: "Was there  any known exposure to COVID before the symptoms began?" CDC Definition of close contact: within 6 feet (2 meters) for a total of 15 minutes or more over a 24-hour period.      Possibly from granddaughter last Sunday because she was sick. 3. ONSET: "When did the COVID-19 symptoms start?"      Wed. Morning woke up with congestion.  Then yesterday I started feeling worse.   No energy,  Want to sleep all the time.   Not long ago I had a lung infection.   I'm for a CT scan on Monday.   I'm a smoker.  Results were a lung infection and nodules in my lungs one is growing.   Pulmonary dr. Geraldine Solar another CT which I'm going to Monday.  4. WORST SYMPTOM: "What is your worst symptom?" (e.g., cough, fever, shortness of breath, muscle aches)     Aching all over, nasal congestion, and coughing up white mucus.   5. COUGH: "Do you have a cough?" If Yes, ask: "How bad is the cough?"       Yes coughing up white mucus. 6. FEVER: "Do you have a fever?" If Yes, ask: "What is your temperature, how was it measured, and when did it start?"     Yes having cold  chills and body aches. 7. RESPIRATORY STATUS: "Describe your breathing?" (e.g., shortness of breath, wheezing, unable to speak)      A little short of breath but not a lot. 8. BETTER-SAME-WORSE: "Are you getting better, staying the same or getting worse compared to yesterday?"  If getting worse, ask, "In what way?"     Getting worse with symptoms. 9. HIGH RISK DISEASE: "Do you have any chronic medical problems?" (e.g., asthma, heart or lung disease, weak immune system, obesity, etc.)     Yes CAD, CKD, COPD, smoker, pulmonary nodules. 10. PREGNANCY: "Is there any chance you are pregnant?" "When was your last menstrual period?"       N/A due to age 69. OTHER SYMPTOMS: "Do you have any other symptoms?"  (e.g., chills, fatigue, headache, loss of smell or taste, muscle pain, sore throat; new loss of smell or taste especially support the diagnosis of COVID-19)        Headaches every day.   Taste/smell fine.   Muscle pain all over and chills.  Protocols used: CORONAVIRUS (COVID-19) DIAGNOSED OR SUSPECTED-A-AH

## 2020-02-14 NOTE — Telephone Encounter (Signed)
Can double book with another virtual sick visit today.

## 2020-02-14 NOTE — Addendum Note (Signed)
Addended by: Georgina Peer on: 02/14/2020 03:00 PM   Modules accepted: Orders

## 2020-02-14 NOTE — Telephone Encounter (Signed)
I called into [facility] and spoke with [facility] who is going to send my note to Dr. Wynetta Emery and will call pt back.

## 2020-02-14 NOTE — Progress Notes (Signed)
Wt 115 lb (52.2 kg)   LMP  (LMP Unknown)   BMI 23.23 kg/m    Subjective:    Patient ID: Kristi Clark, female    DOB: Jul 24, 1950, 69 y.o.   MRN: [id number]  HPI: Kristi Clark is a 69 y.o. female  Chief Complaint  Patient presents with  . Generalized Body Aches  . Fever    Pt has low grade fever and headaches  . Cough    Pt states shes been coughing up mucus, more at night    UPPER RESPIRATORY TRACT INFECTION Duration: 2 days Worst symptom: body aches, fever Fever: yes, 101 Cough: yes Shortness of breath: no Wheezing: no Chest pain: no Chest tightness: no Chest congestion: yes Nasal congestion: yes Runny nose: no Post nasal drip: yes Sneezing: no Sore throat: no Swollen glands: no Sinus pressure: no Headache: yes Face pain: no Toothache: no Ear pain: no  Ear pressure: no  Eyes red/itching:no Eye drainage/crusting: no  Vomiting: no Rash: no Fatigue: yes Sick contacts: yes- not covid or the flu Strep contacts: no  Context: worse Recurrent sinusitis: no Relief with OTC cold/cough medications: no  Treatments attempted: cold/sinus, mucinex and anti-histamine   Relevant past medical, surgical, family and social history reviewed and updated as indicated. Interim medical history since our last visit reviewed. Allergies and medications reviewed and updated.  Review of Systems  Constitutional: Positive for fatigue and fever. Negative for activity change, appetite change, chills, diaphoresis and unexpected weight change.  HENT: Positive for congestion, postnasal drip and rhinorrhea. Negative for dental problem, drooling, ear discharge, ear pain, facial swelling, hearing loss, mouth sores, nosebleeds, sinus pressure, sinus pain, sneezing, sore throat, tinnitus, trouble swallowing and voice change.   Respiratory: Positive for cough. Negative for apnea, choking, chest tightness, shortness of breath, wheezing and stridor.   Cardiovascular: Negative.    Psychiatric/Behavioral: Negative.     Per HPI unless specifically indicated above     Objective:    Wt 115 lb (52.2 kg)   LMP  (LMP Unknown)   BMI 23.23 kg/m   Wt Readings from Last 3 Encounters:  02/14/20 115 lb (52.2 kg)  12/23/19 115 lb 6.4 oz (52.3 kg)  11/18/19 115 lb (52.2 kg)    Physical Exam Vitals and nursing note reviewed.  Constitutional:      General: She is not in acute distress.    Appearance: Normal appearance. She is not ill-appearing, toxic-appearing or diaphoretic.  HENT:     Head: Normocephalic and atraumatic.     Right Ear: External ear normal.     Left Ear: External ear normal.     Nose: Nose normal.     Mouth/Throat:     Mouth: Mucous membranes are moist.     Pharynx: Oropharynx is clear.  Eyes:     General: No scleral icterus.       Right eye: No discharge.        Left eye: No discharge.     Conjunctiva/sclera: Conjunctivae normal.     Pupils: Pupils are equal, round, and reactive to light.  Pulmonary:     Effort: Pulmonary effort is normal. No respiratory distress.     Comments: Speaking in full sentences Musculoskeletal:        General: Normal range of motion.     Cervical back: Normal range of motion.  Skin:    Coloration: Skin is not jaundiced or pale.     Findings: No bruising, erythema, lesion or rash.  Neurological:  Mental Status: She is alert and oriented to person, place, and time. Mental status is at baseline.  Psychiatric:        Mood and Affect: Mood normal.        Behavior: Behavior normal.        Thought Content: Thought content normal.        Judgment: Judgment normal.     Results for orders placed or performed in visit on 12/23/19  TSH  Result Value Ref Range   TSH 0.829 0.450 - 4.500 uIU/mL  Basic metabolic panel  Result Value Ref Range   Glucose 103 (H) 65 - 99 mg/dL   BUN 14 8 - 27 mg/dL   Creatinine, Ser 0.89 0.57 - 1.00 mg/dL   GFR calc non Af Amer 65 >59 mL/min/1.73   GFR calc Af Amer 75 >59  mL/min/1.73   BUN/Creatinine Ratio 16 12 - 28   Sodium 136 134 - 144 mmol/L   Potassium 3.7 3.5 - 5.2 mmol/L   Chloride 94 (L) 96 - 106 mmol/L   CO2 27 20 - 29 mmol/L   Calcium 9.5 8.7 - 10.3 mg/dL      Assessment & Plan:   Problem List Items Addressed This Visit   None   Visit Diagnoses    Suspected COVID-19 virus infection    -  Primary   Will get her swabbed. Self-quarantine until results are back. Prednisone to help with congestion. Call with any concerns. Conitnue to monitor.    Relevant Orders   Novel Coronavirus, NAA ([facility])       Follow up plan: Return if symptoms worsen or fail to improve.   . This visit was completed via MyChart due to the restrictions of the COVID-19 pandemic. All issues as above were discussed and addressed. Physical exam was done as above through visual confirmation on MyChart. If it was felt that the patient should be evaluated in the office, they were directed there. The patient verbally consented to this visit. . Location of the patient: home . Location of the provider: work . Those involved with this call:  . Provider: Park Liter, DO . CMA: Louanna Raw, [place] . Front Desk/Registration: Jill Side  . Time spent on call: 15 minutes with patient face to face via video conference. More than 50% of this time was spent in counseling and coordination of care. 23 minutes total spent in review of patient's record and preparation of their chart.

## 2020-02-14 NOTE — Telephone Encounter (Signed)
Please see Dr.Johnson's message

## 2020-02-16 LAB — NOVEL CORONAVIRUS, NAA: SARS-CoV-2, NAA: NOT DETECTED

## 2020-02-16 LAB — SARS-COV-2, NAA 2 DAY TAT

## 2020-02-17 ENCOUNTER — Emergency Department: Payer: Medicare Other

## 2020-02-17 ENCOUNTER — Inpatient Hospital Stay
Admission: EM | Admit: 2020-02-17 | Discharge: 2020-02-20 | DRG: 190 | Disposition: A | Discharge status: Home Health | Payer: Medicare Other | Attending: Internal Medicine | Admitting: Internal Medicine

## 2020-02-17 ENCOUNTER — Other Ambulatory Visit: Payer: Self-pay

## 2020-02-17 ENCOUNTER — Ambulatory Visit: Payer: Medicare Other | Attending: Specialist

## 2020-02-17 DIAGNOSIS — I2510 Atherosclerotic heart disease of native coronary artery without angina pectoris: Secondary | ICD-10-CM | POA: Diagnosis present

## 2020-02-17 DIAGNOSIS — Z825 Family history of asthma and other chronic lower respiratory diseases: Secondary | ICD-10-CM

## 2020-02-17 DIAGNOSIS — F32A Depression, unspecified: Secondary | ICD-10-CM | POA: Diagnosis present

## 2020-02-17 DIAGNOSIS — D649 Anemia, unspecified: Secondary | ICD-10-CM | POA: Diagnosis present

## 2020-02-17 DIAGNOSIS — Z20822 Contact with and (suspected) exposure to covid-19: Secondary | ICD-10-CM | POA: Diagnosis present

## 2020-02-17 DIAGNOSIS — Z833 Family history of diabetes mellitus: Secondary | ICD-10-CM

## 2020-02-17 DIAGNOSIS — Z7902 Long term (current) use of antithrombotics/antiplatelets: Secondary | ICD-10-CM

## 2020-02-17 DIAGNOSIS — J441 Chronic obstructive pulmonary disease with (acute) exacerbation: Secondary | ICD-10-CM | POA: Diagnosis present

## 2020-02-17 DIAGNOSIS — Z716 Tobacco abuse counseling: Secondary | ICD-10-CM

## 2020-02-17 DIAGNOSIS — E785 Hyperlipidemia, unspecified: Secondary | ICD-10-CM | POA: Diagnosis present

## 2020-02-17 DIAGNOSIS — F17200 Nicotine dependence, unspecified, uncomplicated: Secondary | ICD-10-CM | POA: Diagnosis present

## 2020-02-17 DIAGNOSIS — F17210 Nicotine dependence, cigarettes, uncomplicated: Secondary | ICD-10-CM | POA: Diagnosis present

## 2020-02-17 DIAGNOSIS — Z83438 Family history of other disorder of lipoprotein metabolism and other lipidemia: Secondary | ICD-10-CM

## 2020-02-17 DIAGNOSIS — K219 Gastro-esophageal reflux disease without esophagitis: Secondary | ICD-10-CM | POA: Diagnosis present

## 2020-02-17 DIAGNOSIS — Z880 Allergy status to penicillin: Secondary | ICD-10-CM

## 2020-02-17 DIAGNOSIS — E871 Hypo-osmolality and hyponatremia: Secondary | ICD-10-CM | POA: Diagnosis present

## 2020-02-17 DIAGNOSIS — R5381 Other malaise: Secondary | ICD-10-CM | POA: Diagnosis present

## 2020-02-17 DIAGNOSIS — J9601 Acute respiratory failure with hypoxia: Secondary | ICD-10-CM | POA: Diagnosis not present

## 2020-02-17 DIAGNOSIS — J9621 Acute and chronic respiratory failure with hypoxia: Secondary | ICD-10-CM | POA: Diagnosis present

## 2020-02-17 DIAGNOSIS — Z9049 Acquired absence of other specified parts of digestive tract: Secondary | ICD-10-CM | POA: Diagnosis not present

## 2020-02-17 DIAGNOSIS — J209 Acute bronchitis, unspecified: Secondary | ICD-10-CM | POA: Diagnosis present

## 2020-02-17 DIAGNOSIS — J9600 Acute respiratory failure, unspecified whether with hypoxia or hypercapnia: Secondary | ICD-10-CM | POA: Diagnosis present

## 2020-02-17 DIAGNOSIS — Z66 Do not resuscitate: Secondary | ICD-10-CM | POA: Diagnosis present

## 2020-02-17 DIAGNOSIS — Z7952 Long term (current) use of systemic steroids: Secondary | ICD-10-CM

## 2020-02-17 DIAGNOSIS — Z881 Allergy status to other antibiotic agents status: Secondary | ICD-10-CM | POA: Diagnosis not present

## 2020-02-17 DIAGNOSIS — I129 Hypertensive chronic kidney disease with stage 1 through stage 4 chronic kidney disease, or unspecified chronic kidney disease: Secondary | ICD-10-CM | POA: Diagnosis not present

## 2020-02-17 DIAGNOSIS — T380X5A Adverse effect of glucocorticoids and synthetic analogues, initial encounter: Secondary | ICD-10-CM | POA: Diagnosis present

## 2020-02-17 DIAGNOSIS — E876 Hypokalemia: Secondary | ICD-10-CM | POA: Diagnosis present

## 2020-02-17 DIAGNOSIS — Z823 Family history of stroke: Secondary | ICD-10-CM

## 2020-02-17 DIAGNOSIS — J440 Chronic obstructive pulmonary disease with acute lower respiratory infection: Secondary | ICD-10-CM | POA: Diagnosis present

## 2020-02-17 DIAGNOSIS — J449 Chronic obstructive pulmonary disease, unspecified: Secondary | ICD-10-CM | POA: Diagnosis present

## 2020-02-17 DIAGNOSIS — R059 Cough, unspecified: Secondary | ICD-10-CM | POA: Diagnosis not present

## 2020-02-17 DIAGNOSIS — Z8249 Family history of ischemic heart disease and other diseases of the circulatory system: Secondary | ICD-10-CM

## 2020-02-17 DIAGNOSIS — Z79899 Other long term (current) drug therapy: Secondary | ICD-10-CM

## 2020-02-17 DIAGNOSIS — Z882 Allergy status to sulfonamides status: Secondary | ICD-10-CM

## 2020-02-17 DIAGNOSIS — Z90710 Acquired absence of both cervix and uterus: Secondary | ICD-10-CM

## 2020-02-17 DIAGNOSIS — R0602 Shortness of breath: Secondary | ICD-10-CM | POA: Diagnosis not present

## 2020-02-17 DIAGNOSIS — E860 Dehydration: Secondary | ICD-10-CM | POA: Diagnosis present

## 2020-02-17 DIAGNOSIS — I10 Essential (primary) hypertension: Secondary | ICD-10-CM | POA: Diagnosis present

## 2020-02-17 DIAGNOSIS — Z7982 Long term (current) use of aspirin: Secondary | ICD-10-CM | POA: Diagnosis not present

## 2020-02-17 DIAGNOSIS — Z803 Family history of malignant neoplasm of breast: Secondary | ICD-10-CM

## 2020-02-17 LAB — URINALYSIS, COMPLETE (UACMP) WITH MICROSCOPIC
Bilirubin Urine: NEGATIVE
Glucose, UA: NEGATIVE mg/dL
Hgb urine dipstick: NEGATIVE
Ketones, ur: NEGATIVE mg/dL
Leukocytes,Ua: NEGATIVE
Nitrite: NEGATIVE
Protein, ur: NEGATIVE mg/dL
Specific Gravity, Urine: 1.004 — ABNORMAL LOW (ref 1.005–1.030)
WBC, UA: NONE SEEN WBC/hpf (ref 0–5)
pH: 6 (ref 5.0–8.0)

## 2020-02-17 LAB — BASIC METABOLIC PANEL
Anion gap: 12 (ref 5–15)
BUN: 13 mg/dL (ref 8–23)
CO2: 27 mmol/L (ref 22–32)
Calcium: 9.5 mg/dL (ref 8.9–10.3)
Chloride: 86 mmol/L — ABNORMAL LOW (ref 98–111)
Creatinine, Ser: 0.84 mg/dL (ref 0.44–1.00)
GFR, Estimated: >60 mL/min (ref >60)
Glucose, Bld: 108 mg/dL — ABNORMAL HIGH (ref 70–99)
Potassium: 3.2 mmol/L — ABNORMAL LOW (ref 3.5–5.1)
Sodium: 125 mmol/L — ABNORMAL LOW (ref 135–145)

## 2020-02-17 LAB — RESP PANEL BY RT-PCR (FLU A&B, COVID) ARPGX2
Influenza A by PCR: NEGATIVE
Influenza B by PCR: NEGATIVE
SARS Coronavirus 2 by RT PCR: NEGATIVE

## 2020-02-17 LAB — CBC
HCT: 32.6 % — ABNORMAL LOW (ref 36.0–46.0)
Hemoglobin: 11.5 g/dL — ABNORMAL LOW (ref 12.0–15.0)
MCH: 32.1 pg (ref 26.0–34.0)
MCHC: 35.3 g/dL (ref 30.0–36.0)
MCV: 91.1 fL (ref 80.0–100.0)
Platelets: 279 10*3/uL (ref 150–400)
RBC: 3.58 MIL/uL — ABNORMAL LOW (ref 3.87–5.11)
RDW: 12.6 % (ref 11.5–15.5)
WBC: 14 10*3/uL — ABNORMAL HIGH (ref 4.0–10.5)
nRBC: 0 % (ref 0.0–0.2)

## 2020-02-17 MED ORDER — CENTRUM SILVER ADULT 50+ PO TABS: ORAL_TABLET | Freq: Every day | ORAL | Status: DC

## 2020-02-17 MED ORDER — MOMETASONE FURO-FORMOTEROL FUM 200-5 MCG/ACT IN AERO
2.0000 | INHALATION_SPRAY | Freq: Two times a day (BID) | RESPIRATORY_TRACT | Status: DC
  Administered 2020-02-17 – 2020-02-20 (×6): 2 via RESPIRATORY_TRACT
  Filled 2020-02-17: qty 8.8

## 2020-02-17 MED ORDER — NICOTINE 21 MG/24HR TD PT24
21.0000 mg | MEDICATED_PATCH | Freq: Every day | TRANSDERMAL | Status: DC
  Administered 2020-02-17 – 2020-02-20 (×4): 21 mg via TRANSDERMAL
  Filled 2020-02-17 (×4): qty 1

## 2020-02-17 MED ORDER — POTASSIUM CHLORIDE IN NACL 40-0.9 MEQ/L-% IV SOLN
INTRAVENOUS | Status: DC
  Filled 2020-02-17 (×7): qty 1000

## 2020-02-17 MED ORDER — LACTATED RINGERS IV SOLN: INTRAVENOUS | Status: DC

## 2020-02-17 MED ORDER — SODIUM CHLORIDE 0.9 % IV BOLUS
1000.0000 mL | Freq: Once | INTRAVENOUS | Status: AC
  Administered 2020-02-17: 10:00:00 1000 mL via INTRAVENOUS

## 2020-02-17 MED ORDER — LOSARTAN POTASSIUM 50 MG PO TABS
50.0000 mg | ORAL_TABLET | Freq: Every day | ORAL | Status: DC
  Administered 2020-02-18 – 2020-02-20 (×3): 50 mg via ORAL
  Filled 2020-02-17 (×4): qty 1

## 2020-02-17 MED ORDER — SODIUM CHLORIDE 0.9 % IV SOLN
500.0000 mg | Freq: Once | INTRAVENOUS | Status: AC
  Administered 2020-02-17: 13:00:00 500 mg via INTRAVENOUS
  Filled 2020-02-17: qty 500

## 2020-02-17 MED ORDER — METHYLPREDNISOLONE SODIUM SUCC 40 MG IJ SOLR
40.0000 mg | Freq: Two times a day (BID) | INTRAMUSCULAR | Status: AC
  Administered 2020-02-17 – 2020-02-18 (×2): 40 mg via INTRAVENOUS
  Filled 2020-02-17 (×2): qty 1

## 2020-02-17 MED ORDER — IPRATROPIUM-ALBUTEROL 0.5-2.5 (3) MG/3ML IN SOLN
3.0000 mL | Freq: Four times a day (QID) | RESPIRATORY_TRACT | Status: DC
  Administered 2020-02-17 – 2020-02-20 (×10): 3 mL via RESPIRATORY_TRACT
  Filled 2020-02-17 (×11): qty 3

## 2020-02-17 MED ORDER — POTASSIUM CHLORIDE 10 MEQ/100ML IV SOLN
10.0000 meq | Freq: Once | INTRAVENOUS | Status: AC
  Filled 2020-02-17: qty 100

## 2020-02-17 MED ORDER — ASPIRIN EC 81 MG PO TBEC
81.0000 mg | DELAYED_RELEASE_TABLET | Freq: Every day | ORAL | Status: DC
  Administered 2020-02-18 – 2020-02-20 (×3): 81 mg via ORAL
  Filled 2020-02-17 (×3): qty 1

## 2020-02-17 MED ORDER — CLOPIDOGREL BISULFATE 75 MG PO TABS
75.0000 mg | ORAL_TABLET | Freq: Every day | ORAL | Status: DC
  Administered 2020-02-18 – 2020-02-20 (×3): 75 mg via ORAL
  Filled 2020-02-17 (×3): qty 1

## 2020-02-17 MED ORDER — ALBUTEROL (5 MG/ML) CONTINUOUS INHALATION SOLN
10.0000 mg/h | INHALATION_SOLUTION | Freq: Once | RESPIRATORY_TRACT | Status: DC
  Filled 2020-02-17: qty 20

## 2020-02-17 MED ORDER — ALBUTEROL SULFATE (2.5 MG/3ML) 0.083% IN NEBU
2.5000 mg | INHALATION_SOLUTION | RESPIRATORY_TRACT | Status: DC | PRN
  Administered 2020-02-19: 07:00:00 2.5 mg via RESPIRATORY_TRACT
  Filled 2020-02-17: qty 3

## 2020-02-17 MED ORDER — METHYLPREDNISOLONE SODIUM SUCC 125 MG IJ SOLR
125.0000 mg | Freq: Once | INTRAMUSCULAR | Status: AC
  Administered 2020-02-17: 10:00:00 125 mg via INTRAVENOUS
  Filled 2020-02-17: qty 2

## 2020-02-17 MED ORDER — CETIRIZINE HCL 10 MG PO TABS
10.0000 mg | ORAL_TABLET | Freq: Every day | ORAL | Status: DC
  Administered 2020-02-18 – 2020-02-20 (×3): 10 mg via ORAL
  Filled 2020-02-17 (×4): qty 1

## 2020-02-17 MED ORDER — METOPROLOL SUCCINATE ER 50 MG PO TB24
50.0000 mg | ORAL_TABLET | Freq: Every day | ORAL | Status: DC
  Administered 2020-02-18 – 2020-02-20 (×3): 50 mg via ORAL
  Filled 2020-02-17 (×4): qty 1

## 2020-02-17 MED ORDER — SODIUM CHLORIDE 0.9 % IV SOLN
2.0000 g | Freq: Once | INTRAVENOUS | Status: AC
  Administered 2020-02-17: 12:00:00 2 g via INTRAVENOUS
  Filled 2020-02-17: qty 20

## 2020-02-17 MED ORDER — ENOXAPARIN SODIUM 40 MG/0.4ML ~~LOC~~ SOLN
40.0000 mg | SUBCUTANEOUS | Status: DC
  Administered 2020-02-17 – 2020-02-19 (×3): 40 mg via SUBCUTANEOUS
  Filled 2020-02-17 (×3): qty 0.4

## 2020-02-17 MED ORDER — ISOSORBIDE MONONITRATE ER 30 MG PO TB24
30.0000 mg | ORAL_TABLET | Freq: Every day | ORAL | Status: DC
  Administered 2020-02-18 – 2020-02-20 (×3): 30 mg via ORAL
  Filled 2020-02-17 (×3): qty 1

## 2020-02-17 MED ORDER — ATORVASTATIN CALCIUM 20 MG PO TABS
40.0000 mg | ORAL_TABLET | Freq: Every day | ORAL | Status: DC
  Administered 2020-02-17 – 2020-02-19 (×3): 40 mg via ORAL
  Filled 2020-02-17 (×3): qty 2

## 2020-02-17 MED ORDER — IPRATROPIUM BROMIDE 0.02 % IN SOLN
1.0000 mg | Freq: Once | RESPIRATORY_TRACT | Status: AC
  Administered 2020-02-17: 10:00:00 1 mg via RESPIRATORY_TRACT
  Filled 2020-02-17: qty 5

## 2020-02-17 MED ORDER — PROBIOTIC-10 PO CHEW: 1.0000 | CHEWABLE_TABLET | Freq: Every day | ORAL | Status: DC

## 2020-02-17 MED ORDER — PANTOPRAZOLE SODIUM 40 MG PO TBEC
40.0000 mg | DELAYED_RELEASE_TABLET | Freq: Every day | ORAL | Status: DC
  Administered 2020-02-18 – 2020-02-20 (×3): 40 mg via ORAL
  Filled 2020-02-17 (×3): qty 1

## 2020-02-17 MED ORDER — POTASSIUM CHLORIDE 10 MEQ/100ML IV SOLN
INTRAVENOUS | Status: AC
  Administered 2020-02-17: 14:00:00 10 meq via INTRAVENOUS
  Filled 2020-02-17: qty 100

## 2020-02-17 MED ORDER — DOXYCYCLINE HYCLATE 100 MG PO TABS
100.0000 mg | ORAL_TABLET | Freq: Two times a day (BID) | ORAL | Status: DC
  Administered 2020-02-17 – 2020-02-20 (×7): 100 mg via ORAL
  Filled 2020-02-17 (×7): qty 1

## 2020-02-17 MED ORDER — METHYLPREDNISOLONE SODIUM SUCC 40 MG IJ SOLR: 40.0000 mg | Freq: Two times a day (BID) | INTRAMUSCULAR | Status: DC

## 2020-02-17 MED ORDER — PREDNISONE 20 MG PO TABS
40.0000 mg | ORAL_TABLET | Freq: Every day | ORAL | Status: DC
  Administered 2020-02-19: 09:00:00 40 mg via ORAL
  Filled 2020-02-17: qty 2

## 2020-02-17 MED ORDER — FLUTICASONE PROPIONATE 50 MCG/ACT NA SUSP: 2.0000 | Freq: Every day | NASAL | Status: DC

## 2020-02-17 MED ORDER — ATORVASTATIN CALCIUM 20 MG PO TABS: 40.0000 mg | ORAL_TABLET | Freq: Every day | ORAL | Status: DC

## 2020-02-17 MED ORDER — FLUTICASONE PROPIONATE 50 MCG/ACT NA SUSP
2.0000 | Freq: Every day | NASAL | Status: DC
  Administered 2020-02-18 – 2020-02-20 (×3): 2 via NASAL
  Filled 2020-02-17: qty 16

## 2020-02-17 MED ORDER — CALCIUM CARBONATE 1250 (500 CA) MG PO TABS
1250.0000 mg | ORAL_TABLET | Freq: Every day | ORAL | Status: DC
  Administered 2020-02-18 – 2020-02-20 (×3): 1250 mg via ORAL
  Filled 2020-02-17 (×4): qty 1

## 2020-02-17 MED ORDER — PREDNISONE 20 MG PO TABS: 40.0000 mg | ORAL_TABLET | Freq: Every day | ORAL | Status: DC

## 2020-02-17 MED ORDER — IPRATROPIUM-ALBUTEROL 0.5-2.5 (3) MG/3ML IN SOLN
3.0000 mL | Freq: Once | RESPIRATORY_TRACT | Status: AC
  Administered 2020-02-17: 12:00:00 3 mL via RESPIRATORY_TRACT
  Filled 2020-02-17: qty 3

## 2020-02-17 MED ORDER — MOMETASONE FURO-FORMOTEROL FUM 200-5 MCG/ACT IN AERO: 2.0000 | INHALATION_SPRAY | Freq: Two times a day (BID) | RESPIRATORY_TRACT | Status: DC

## 2020-02-17 NOTE — ED Triage Notes (Signed)
First Nurse Note:  C/O SOB x 5 days.  Seen by PCP on Friday, tested for COVID with a negative result, started on prednisone.  States over weekend needing to use rescue inhalers.  AAOx3.  Skin warm and dry.  DOE noted with speech, resolves with rest.

## 2020-02-17 NOTE — Evaluation (Signed)
Physical Therapy Evaluation Patient Details Name: Kristi Clark MRN: [id number] DOB: May 16, 1950 Today's Date: 02/17/2020   History of Present Illness  Pt is a 69 y.o. female with medical history significant for COPD, depression, GERD, hypertension, CKD III, and osteopenia who presented to the emergency room for evaluation of shortness of breath.  MD assessment inlcudes: COPD with acute exacerbation, acute respiratory failure, hypnatremia, and hypkalemia.    Clinical Impression  Pt was pleasant and motivated to participate during the session.  Pt found on 2LO2/min, no O2 [place] at baseline, with SpO2 at 100%.  Per nursing ok for trial on room air.  Pt taken through below graded therex with SpO2 monitored continuously and remained in the low to mid 90s throughout.  Pt steady with transfers and secondary to weakness compared to baseline requested to use the RW with amb.  Pt was able to amb 100 feet before SpO2 began to drop below 92%.  Upon returning to sitting SpO2 continued to drop down to a low of 84% before slowly increasing back to the high 80s/low 90s.  Pt returned to 2LO2/min as CNA entered room at end of session for toileting, nursing notified.  Pt will benefit from HHPT services upon discharge to safely address deficits listed in patient problem list for decreased risk of further functional decline and eventual return to PLOF.       Follow Up Recommendations Home health PT;Supervision - Intermittent    Equipment Recommendations  None recommended by PT    Recommendations for Other Services       Precautions / Restrictions Precautions Precautions: Fall Restrictions Weight Bearing Restrictions: No Other Position/Activity Restrictions: HOB >/= 30 deg, SpO2 >/= 92%      Mobility  Bed Mobility Overal bed mobility: Modified Independent             General bed mobility comments: Extra time and effort only    Transfers Overall transfer level: Needs assistance Equipment  used: Rolling walker (2 wheeled) Transfers: Sit to/from Stand Sit to Stand: Supervision         General transfer comment: Good eccentric and concentric control and stability  Ambulation/Gait Ambulation/Gait assistance: Supervision Gait Distance (Feet): 100 Feet Assistive device: Rolling walker (2 wheeled) Gait Pattern/deviations: Step-through pattern;Decreased step length - right;Decreased step length - left Gait velocity: decreased   General Gait Details: Slow cadence but steady with amb without LOB including during sharp turns and navigation in tight spaces  Stairs            Wheelchair Mobility    Modified Rankin (Stroke Patients Only)       Balance Overall balance assessment: Needs assistance   Sitting balance-Leahy Scale: Normal     Standing balance support: Bilateral upper extremity supported;During functional activity Standing balance-Leahy Scale: Good                               Pertinent Vitals/Pain Pain Assessment: No/denies pain    Home Living Family/patient expects to be discharged to:: Private residence Living Arrangements: Alone Available Help at Discharge: Family;Friend(s);Available 24 hours/day Type of Home: House Home Access: Ramped entrance     Home Layout: One level Home Equipment: Bedside commode;Walker - 4 wheels;Shower seat      Prior Function Level of Independence: Independent         Comments: Ind community ambulator without an AD, no fall history, Ind with ADLs, does not use O2 at baseline  Hand Dominance        Extremity/Trunk Assessment   Upper Extremity Assessment Upper Extremity Assessment: Overall WFL for tasks assessed    Lower Extremity Assessment Lower Extremity Assessment: Generalized weakness       Communication   Communication: No difficulties  Cognition Arousal/Alertness: Awake/alert Behavior During Therapy: WFL for tasks assessed/performed Overall Cognitive Status: Within  Functional Limits for tasks assessed                                        General Comments      Exercises Total Joint Exercises Ankle Circles/Pumps: AROM;Strengthening;Both;5 reps;10 reps Quad Sets: Strengthening;Both;5 reps;10 reps Gluteal Sets: Strengthening;Both;5 reps;10 reps Heel Slides: Strengthening;Both;10 reps Hip ABduction/ADduction: Strengthening;Both;10 reps Long Arc Quad: Strengthening;Both;10 reps Knee Flexion: Strengthening;Both;10 reps   Assessment/Plan    PT Assessment Patient needs continued PT services  PT Problem List Cardiopulmonary status limiting activity;Decreased strength;Decreased activity tolerance;Decreased knowledge of use of DME       PT Treatment Interventions DME instruction;Gait training;Functional mobility training;Therapeutic activities;Therapeutic exercise;Balance training;Patient/family education    PT Goals (Current goals can be found in the Care Plan section)  Acute Rehab PT Goals Patient Stated Goal: To get back to doing what I want to do PT Goal Formulation: With patient Time For Goal Achievement: 03/01/20 Potential to Achieve Goals: Good    Frequency Min 2X/week   Barriers to discharge        Co-evaluation               AM-PAC PT "6 Clicks" Mobility  Outcome Measure Help needed turning from your back to your side while in a flat bed without using bedrails?: None Help needed moving from lying on your back to sitting on the side of a flat bed without using bedrails?: None Help needed moving to and from a bed to a chair (including a wheelchair)?: A Little Help needed standing up from a chair using your arms (e.g., wheelchair or bedside chair)?: A Little Help needed to walk in hospital room?: A Little Help needed climbing 3-5 steps with a railing? : A Little 6 Click Score: 20    End of Session Equipment Utilized During Treatment: Gait belt Activity Tolerance: Patient tolerated treatment well Patient  left: in bed;with nursing/sitter in room (Pt left with CNA assisting pt with toileting) Nurse Communication: Mobility status;Other (comment) (Pt repsonse to activity on room air) PT Visit Diagnosis: Muscle weakness (generalized) (M62.81);Difficulty in walking, not elsewhere classified (R26.2)    Time: 0923-3007 PT Time Calculation (min) (ACUTE ONLY): 41 min   Charges:   PT Evaluation $PT Eval Moderate Complexity: 1 Mod PT Treatments $Therapeutic Exercise: 8-22 mins        D. Royetta Asal PT, DPT 02/17/20, 3:48 PM

## 2020-02-17 NOTE — Progress Notes (Signed)
Pt ambulated with PT on RA, O2 sat dropped to mid 80s. 2L via [place] placed back on patient, recovered to 100%

## 2020-02-17 NOTE — ED Notes (Signed)
Lunch meal tray given to pt at this time.

## 2020-02-17 NOTE — ED Notes (Signed)
Pt ambulated to the restroom without assistance. When pt returned to the bed O2 saturations were 83-86%. Pt placed on 2L [place] at this time. O2 levels increased to 94%

## 2020-02-17 NOTE — H&P (Addendum)
History and Physical    Kristi Clark OIT:[id number] DOB: [birth year] DOA: 02/17/2020  PCP: [name], DO   Patient coming from: Home  I have personally briefly reviewed patient's old medical records in [place]  Chief Complaint: Shortness of breath  HPI: Kristi Clark is a 69 y.o. female with medical history significant for COPD, depression, GERD, hypertension who presents to the emergency room for evaluation of shortness of breath.  Patient has had symptoms for about 5 days and complains of congestion, cough, wheezing, shortness of breath and fever with a T-max of 101 F.  She contacted her primary care provider who was concerned that she may have infection secondary to COVID-19 virus and so recommended a Covid test which came back negative.  Patient was started on prednisone which she has taken for 3 days without any significant improvement in her symptoms.  On the morning of her admission she states that her symptoms had worsened and she had trouble catching her breath.  With ambulation she was found to have room air pulse oximetry between 83% -  86% and she was tachypneic.  She was placed on 2 L of oxygen with improvement in her pulse oximetry to 94%. She denies having any chest pain, no nausea, no vomiting, no abdominal pain, no dizziness, no lightheadedness, no lower extremity swelling, no headache. Labs show sodium 125, potassium 3.2, chloride 86, bicarb 27, glucose 108, creatinine 0.84, BUN 13, calcium 9.5, white count 14.0, hemoglobin 11.5, hematocrit 32.6, MCV 91, RDW 12.6, platelet count 279 Respiratory viral panel is negative Chest x-ray reviewed by me shows chronic changes with increased bronchitic markings bilaterally consistent with acute bronchitis superimposed with more chronic fibrotic changes. Twelve-lead EKG shows normal sinus rhythm    ED Course: Patient is a 69 year old female with a history of COPD who presents for evaluation of a cough productive  of occasional yellow phlegm associated with wheezing and shortness of breath. She was hypoxic with room air pulse oximetry between 83 and 86% and is currently on 2 L of oxygen with improvement in her pulse oximetry to the upper 90s.  She will be admitted to the hospital for further evaluation  Review of Systems: As per HPI otherwise 10 point review of systems negative.    Past Medical History:  Diagnosis Date  . COPD (chronic obstructive pulmonary disease) ([place])   . Depression   . GERD (gastroesophageal reflux disease)   . Hyperlipidemia   . Hypertension   . Menopause   . Osteopenia   . Tobacco abuse     Past Surgical History:  Procedure Laterality Date  . ABDOMINAL HYSTERECTOMY    . CARDIAC CATHETERIZATION    . CHOLECYSTECTOMY  2014  . COLONOSCOPY WITH PROPOFOL N/A 01/16/2018   Procedure: COLONOSCOPY WITH PROPOFOL;  Surgeon: Lollie Sails, MD;  Location: [facility] ENDOSCOPY;  Service: Endoscopy;  Laterality: N/A;  . ESOPHAGOGASTRODUODENOSCOPY (EGD) WITH PROPOFOL N/A 01/16/2018   Procedure: ESOPHAGOGASTRODUODENOSCOPY (EGD) WITH PROPOFOL;  Surgeon: Lollie Sails, MD;  Location: [facility] ENDOSCOPY;  Service: Endoscopy;  Laterality: N/A;  . heart stent       reports that she has been smoking cigarettes. She has a 112.00 pack-year smoking history. She has never used smokeless tobacco. She reports that she does not drink alcohol and does not use drugs.  Allergies  Allergen Reactions  . Clindamycin/Lincomycin Hives  . Amoxicillin Rash  . Avelox [Moxifloxacin Hcl In Nacl] Rash  . Codeine Sulfate Nausea Only  . Penicillins  Rash  . Sulfa Antibiotics Rash    Family History  Problem Relation Age of Onset  . Cancer Mother   . Stroke Mother   . Heart disease Father   . Hyperlipidemia Father   . Cancer Sister   . Diabetes Sister   . Breast cancer Sister 52  . Diabetes Brother   . Asthma Son   . Cancer Son   . Diabetes Daughter   . Hypertension Daughter   . Cancer Maternal  Grandmother        gallbladder  . Diabetes Brother   . Heart disease Brother   . Breast cancer Paternal Aunt      Prior to Admission medications   Medication Sig Start Date End Date Taking? Authorizing Provider  albuterol (VENTOLIN HFA) 108 (90 Base) MCG/ACT inhaler Inhale 2 puffs into the lungs every 6 (six) hours as needed for wheezing or shortness of breath. 03/18/19   Volney American, PA-C  aspirin 81 MG tablet Take 81 mg by mouth daily.    [provider]  atorvastatin (LIPITOR) 40 MG tablet Take 1 tablet (40 mg total) by mouth daily. 10/21/19   Volney American, PA-C  Calcium Carbonate (CALCIUM 600 PO) Take by mouth daily.    [provider]  Cetirizine HCl (ZYRTEC PO) Take by mouth daily.    [provider]  clopidogrel (PLAVIX) 75 MG tablet Take 1 tablet (75 mg total) by mouth daily. 10/21/19   Volney American, PA-C  fluticasone [facility]) 50 MCG/ACT nasal spray Place 2 sprays into both nostrils daily. 10/21/19   Volney American, PA-C  Fluticasone-Salmeterol (ADVAIR) 250-50 MCG/DOSE AEPB Inhale 1 puff into the lungs 2 (two) times daily. 10/08/19   Eulogio Bear, NP  hydrochlorothiazide (HYDRODIURIL) 25 MG tablet Take 1 tablet (25 mg total) by mouth daily. 10/21/19   Volney American, PA-C  isosorbide mononitrate (IMDUR) 30 MG 24 hr tablet Take 1 tablet (30 mg total) by mouth daily. 10/21/19   Volney American, PA-C  losartan (COZAAR) 50 MG tablet Take 1 tablet (50 mg total) by mouth daily. 11/18/19   Johnson, Megan P, DO  metoprolol succinate (TOPROL-XL) 50 MG 24 hr tablet Take 1 tablet (50 mg total) by mouth daily. Take with or immediately following a meal. 10/21/19   Volney American, PA-C  Multiple Vitamins-Minerals (CENTRUM SILVER ADULT 50+ PO) Take 1 tablet by mouth daily.    [provider]  nortriptyline (PAMELOR) 10 MG capsule Take 1 capsule (10 mg total) by mouth at bedtime. Patient not taking: No sig  reported 11/18/19   Park Liter P, DO  ondansetron (ZOFRAN ODT) 4 MG disintegrating tablet Take 1 tablet (4 mg total) by mouth every 8 (eight) hours as needed for nausea or vomiting. Patient not taking: No sig reported 11/08/19   Park Liter P, DO  pantoprazole (PROTONIX) 40 MG tablet Take 1 tablet (40 mg total) by mouth daily. 10/21/19   Volney American, PA-C  predniSONE (DELTASONE) 50 MG tablet Take 1 tablet (50 mg total) by mouth daily with breakfast. 02/14/20   Park Liter P, DO  Probiotic Product (PROBIOTIC-10 PO) Take by mouth. Every other day    [provider]  tiotropium (SPIRIVA) 18 MCG inhalation capsule Place 1 capsule (18 mcg total) into inhaler and inhale daily. 09/05/19   Volney American, PA-C    Physical Exam: Vitals:   02/17/20 1020 02/17/20 1100 02/17/20 1200 02/17/20 1300  BP: (!) 162/58 (!) 150/50 Marland Kitchen)  189/58 (!) 145/76  Pulse: 91 94 (!) 104 98  Resp: (!) 25 (!) 28 (!) 26 (!) 30  Temp:      TempSrc:      SpO2: 100% 91% 98% 100%  Weight:      Height:         Vitals:   02/17/20 1020 02/17/20 1100 02/17/20 1200 02/17/20 1300  BP: (!) 162/58 (!) 150/50 (!) 189/58 (!) 145/76  Pulse: 91 94 (!) 104 98  Resp: (!) 25 (!) 28 (!) 26 (!) 30  Temp:      TempSrc:      SpO2: 100% 91% 98% 100%  Weight:      Height:        Constitutional: NAD, alert and oriented x 3 Eyes: PERRL, lids and conjunctivae normal ENMT: Mucous membranes are moist.  Neck: normal, supple, no masses, no thyromegaly Respiratory: Scattered rhonchi in both lung fields, faint wheezing, no crackles. Normal respiratory effort. No accessory muscle use.  Cardiovascular: Regular rate and rhythm, no murmurs / rubs / gallops. No extremity edema. 2+ pedal pulses. No carotid bruits.  Abdomen: no tenderness, no masses palpated. No hepatosplenomegaly. Bowel sounds positive.  Musculoskeletal: no clubbing / cyanosis. No joint deformity upper and lower extremities.  Skin: no rashes,  lesions, ulcers.  Neurologic: No gross focal neurologic deficit. Psychiatric: Normal mood and affect.   Labs on Admission: I have personally reviewed following labs and imaging studies  CBC: Recent Labs  Lab 02/17/20 0834  WBC 14.0*  HGB 11.5*  HCT 32.6*  MCV 91.1  PLT 170   Basic Metabolic Panel: Recent Labs  Lab 02/17/20 0834  NA 125*  K 3.2*  CL 86*  CO2 27  GLUCOSE 108*  BUN 13  CREATININE 0.84  CALCIUM 9.5   GFR: Estimated Creatinine Clearance: 45.4 mL/min (by C-G formula based on SCr of 0.84 mg/dL). Liver Function Tests: No results for input(s): AST, ALT, ALKPHOS, BILITOT, PROT, ALBUMIN in the last 168 hours. No results for input(s): LIPASE, AMYLASE in the last 168 hours. No results for input(s): AMMONIA in the last 168 hours. Coagulation Profile: No results for input(s): INR, PROTIME in the last 168 hours. Cardiac Enzymes: No results for input(s): CKTOTAL, CKMB, CKMBINDEX, TROPONINI in the last 168 hours. BNP (last 3 results) No results for input(s): PROBNP in the last 8760 hours. HbA1C: No results for input(s): HGBA1C in the last 72 hours. CBG: No results for input(s): GLUCAP in the last 168 hours. Lipid Profile: No results for input(s): CHOL, HDL, LDLCALC, TRIG, CHOLHDL, LDLDIRECT in the last 72 hours. Thyroid Function Tests: No results for input(s): TSH, T4TOTAL, FREET4, T3FREE, THYROIDAB in the last 72 hours. Anemia Panel: No results for input(s): VITAMINB12, FOLATE, FERRITIN, TIBC, IRON, RETICCTPCT in the last 72 hours. Urine analysis:    Component Value Date/Time   COLORURINE STRAW (A) 02/17/2020 1022   APPEARANCEUR CLEAR (A) 02/17/2020 1022   APPEARANCEUR Clear 11/18/2019 1347   LABSPEC 1.004 (L) 02/17/2020 1022   PHURINE 6.0 02/17/2020 1022   GLUCOSEU NEGATIVE 02/17/2020 1022   HGBUR NEGATIVE 02/17/2020 1022   BILIRUBINUR NEGATIVE 02/17/2020 1022   BILIRUBINUR Negative 11/18/2019 [place] 02/17/2020 1022   PROTEINUR  NEGATIVE 02/17/2020 1022   NITRITE NEGATIVE 02/17/2020 1022   LEUKOCYTESUR NEGATIVE 02/17/2020 1022    Radiological Exams on Admission: DG Chest 2 View  Result Date: 02/17/2020 CLINICAL DATA:  Cough and shortness of breath for several days EXAM: CHEST - 2 VIEW COMPARISON:  07/01/2019 FINDINGS: Cardiac shadow is stable. Aortic calcifications are again noted. Increase in chronic scarring is noted bilaterally. Some increased bronchitic markings are noted centrally likely related to underlying bronchitis. No focal confluent infiltrate is seen. No effusion or pneumothorax is noted. Degenerative changes of the thoracic spine are seen. IMPRESSION: Increased bronchitic markings bilaterally consistent with acute bronchitis superimposed over more chronic fibrotic changes. Electronically Signed   By: Inez Catalina M.D.   On: 02/17/2020 10:09    EKG: Independently reviewed.  Normal sinus rhythm  Assessment/Plan Principal Problem:   COPD with acute exacerbation (HCC) Active Problems:   Benign hypertension with chronic kidney disease   CAD (coronary artery disease)   Hyponatremia   Hypokalemia   Respiratory failure, acute (HCC)    COPD with acute exacerbation Patient has a history of COPD and presents to the ER for evaluation of worsening shortness of breath from her baseline associated with a productive cough, wheezing and subjective fevers We will place patient on doxycycline 100 mg p.o. twice daily Place patient on systemic steroids Continue scheduled and as needed bronchodilator therapy Continue inhaled steroids   Coronary artery disease Stable and not acutely exacerbated Continue aspirin, Plavix, nitrates, beta-blockers and statins   Hyponatremia/hypokalemia Most likely related to hydrochlorothiazide use We will supplement potassium IV fluid hydration with normal saline Repeat electrolytes in a.m.   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 Continue nitrates, losartan  and metoprolol   GERD Continue PPI     Acute Respiratory Failure Secondary to acute COPD exacerbation Patient noted to be tachypneic with room air pulse oximetry post ambulation of 83 - 86% Patient is currently on 2 L of oxygen with improvement in her pulse oximetry to 94% She will need to be assessed for home oxygen prior to discharge    Nicotine dependence Patient smokes over 1 pack of cigarettes daily Smoking cessation has been discussed with patient We will place patient on a nicotine transdermal patch 21 mg daily   DVT prophylaxis: Loveniox Code Status: DO NOT RESUSCITATE Family Communication: Greater than 50% of time spent discussing plan of care with patient at the bedside.  All questions and concerns have been addressed and she verbalizes understanding and agrees with the plan.  CODE STATUS was discussed and she is a DNR Disposition Plan: Back to previous home environment Consults called: None    Jamauri Kruzel MD [facility]     02/17/2020, 1:43 PM

## 2020-02-17 NOTE — ED Triage Notes (Signed)
Pt c/o  Cough with chest and sinus congestion since 12/8, states she tested negative for covid, has been taking prednisone and inhalers, states this morning the SOB was worse, pt is in NAD at present.

## 2020-02-17 NOTE — ED Provider Notes (Signed)
[facility] Emergency Department Provider Note  ____________________________________________   Event Date/Time   First MD Initiated Contact with Patient 02/17/20 [phone]     (approximate)  I have reviewed the triage vital signs and the nursing notes.   HISTORY  Chief Complaint Shortness of Breath    HPI Kristi Clark is a 69 y.o. female  With h/o HTN, HLD, COPD, GERD, here with cough, SOB. Pt reports that over the past 4-5 days, she has had cough, wheezing, and SOB. Her sx started with cough, sore throat, fever to 101, and sputum production last week. She had received the COVID vaccine and thought it was related to this. She states since then, she was seen by her PCP over telephone and had a neg COVID test, and was started on prednisone. She's been on prednisone since then. She's had ongoing SOB, wheezing, subjective fevers. Her SOB is worsening. She has been using her home nebs and inhalers w/o relief. Of note, her granddaughter has URI sx as well as her daughter. Unknown what the have (have not been tested).       Past Medical History:  Diagnosis Date  . COPD (chronic obstructive pulmonary disease) ([place])   . Depression   . GERD (gastroesophageal reflux disease)   . Hyperlipidemia   . Hypertension   . Menopause   . Osteopenia   . Tobacco abuse     Patient Active Problem List   Diagnosis Date Noted  . COPD with acute exacerbation ([place]) 02/17/2020  . Hyponatremia 02/17/2020  . Hypokalemia 02/17/2020  . Respiratory failure, acute ([place]) 02/17/2020  . Nicotine dependence 02/17/2020  . Postherpetic neuralgia 11/24/2019  . Cough 10/29/2019  . Tobacco abuse 10/17/2017  . IBS (irritable bowel syndrome) 04/04/2017  . Advanced care planning/counseling discussion 10/03/2016  . CAD (coronary artery disease) 10/03/2016  . History of anemia 10/03/2016  . Benign hypertension with chronic kidney disease 03/06/2015  . Allergic rhinitis 09/03/2014  . CKD  (chronic kidney disease), 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  . GERD (gastroesophageal reflux disease) 09/03/2014  . Hyperlipidemia 09/03/2014  . Anemia 07/14/2014  . COPD, severe ([place]) 08/08/2013  . Pulmonary nodules 08/08/2013    Past Surgical History:  Procedure Laterality Date  . ABDOMINAL HYSTERECTOMY    . CARDIAC CATHETERIZATION    . CHOLECYSTECTOMY  2014  . COLONOSCOPY WITH PROPOFOL N/A 01/16/2018   Procedure: COLONOSCOPY WITH PROPOFOL;  Surgeon: Lollie Sails, MD;  Location: [facility] ENDOSCOPY;  Service: Endoscopy;  Laterality: N/A;  . ESOPHAGOGASTRODUODENOSCOPY (EGD) WITH PROPOFOL N/A 01/16/2018   Procedure: ESOPHAGOGASTRODUODENOSCOPY (EGD) WITH PROPOFOL;  Surgeon: Lollie Sails, MD;  Location: [facility] ENDOSCOPY;  Service: Endoscopy;  Laterality: N/A;  . heart stent      Prior to Admission medications   Medication Sig Start Date End Date Taking? Authorizing Provider  albuterol (VENTOLIN HFA) 108 (90 Base) MCG/ACT inhaler Inhale 2 puffs into the lungs every 6 (six) hours as needed for wheezing or shortness of breath. 03/18/19   Volney American, PA-C  aspirin 81 MG tablet Take 81 mg by mouth daily.    [provider]  atorvastatin (LIPITOR) 40 MG tablet Take 1 tablet (40 mg total) by mouth daily. 10/21/19   Volney American, PA-C  Calcium Carbonate (CALCIUM 600 PO) Take by mouth daily.    [provider]  Cetirizine HCl (ZYRTEC PO) Take by mouth daily.    [provider]  clopidogrel (PLAVIX) 75 MG tablet Take 1 tablet (75 mg total) by  mouth daily. 10/21/19   Volney American, PA-C  fluticasone [facility]) 50 MCG/ACT nasal spray Place 2 sprays into both nostrils daily. 10/21/19   Volney American, PA-C  Fluticasone-Salmeterol (ADVAIR) 250-50 MCG/DOSE AEPB Inhale 1 puff into the lungs 2 (two) times daily. 10/08/19   Eulogio Bear, NP  hydrochlorothiazide (HYDRODIURIL) 25 MG tablet Take 1 tablet (25 mg total) by mouth daily. 10/21/19   Volney American, PA-C  isosorbide mononitrate (IMDUR) 30 MG 24 hr tablet Take 1 tablet (30 mg total) by mouth daily. 10/21/19   Volney American, PA-C  losartan (COZAAR) 50 MG tablet Take 1 tablet (50 mg total) by mouth daily. 11/18/19   Johnson, Megan P, DO  metoprolol succinate (TOPROL-XL) 50 MG 24 hr tablet Take 1 tablet (50 mg total) by mouth daily. Take with or immediately following a meal. 10/21/19   Volney American, PA-C  Multiple Vitamins-Minerals (CENTRUM SILVER ADULT 50+ PO) Take 1 tablet by mouth daily.    [provider]  nortriptyline (PAMELOR) 10 MG capsule Take 1 capsule (10 mg total) by mouth at bedtime. Patient not taking: No sig reported 11/18/19   Park Liter P, DO  ondansetron (ZOFRAN ODT) 4 MG disintegrating tablet Take 1 tablet (4 mg total) by mouth every 8 (eight) hours as needed for nausea or vomiting. Patient not taking: No sig reported 11/08/19   Park Liter P, DO  pantoprazole (PROTONIX) 40 MG tablet Take 1 tablet (40 mg total) by mouth daily. 10/21/19   Volney American, PA-C  predniSONE (DELTASONE) 50 MG tablet Take 1 tablet (50 mg total) by mouth daily with breakfast. 02/14/20   Park Liter P, DO  Probiotic Product (PROBIOTIC-10 PO) Take by mouth. Every other day    [provider]  tiotropium (SPIRIVA) 18 MCG inhalation capsule Place 1 capsule (18 mcg total) into inhaler and inhale daily. 09/05/19   Volney American, PA-C    Allergies Clindamycin/lincomycin, Amoxicillin, Avelox [moxifloxacin hcl in nacl], Codeine sulfate, Penicillins, and Sulfa antibiotics  Family History  Problem Relation Age of Onset  . Cancer Mother   . Stroke Mother   . Heart disease Father   . Hyperlipidemia Father   . Cancer Sister   . Diabetes Sister   . Breast cancer Sister 35  . Diabetes Brother   . Asthma Son   . Cancer Son   . Diabetes Daughter   . Hypertension Daughter   . Cancer Maternal Grandmother        gallbladder  . Diabetes  Brother   . Heart disease Brother   . Breast cancer Paternal Aunt     Social History Social History   Tobacco Use  . Smoking status: Current Every Day Smoker    Packs/day: 2.00    Years: 56.00    Pack years: 112.00    Types: Cigarettes  . Smokeless tobacco: Never Used  . Tobacco comment: has the gum and patch.   Vaping Use  . Vaping Use: Never used  Substance Use Topics  . Alcohol use: No  . Drug use: No    Review of Systems  Review of Systems  Constitutional: Positive for chills, fatigue and fever.  HENT: Negative for congestion and sore throat.   Eyes: Negative for visual disturbance.  Respiratory: Positive for cough, shortness of breath and wheezing.   Cardiovascular: Negative for chest pain.  Gastrointestinal: Negative for abdominal pain, diarrhea, nausea and vomiting.  Genitourinary: Negative for flank pain.  Musculoskeletal: Negative for  back pain and neck pain.  Skin: Negative for rash and wound.  Neurological: Positive for weakness.  All other systems reviewed and are negative.    ____________________________________________  PHYSICAL EXAM:      VITAL SIGNS: ED Triage Vitals [02/17/20 0830]  Enc Vitals Group     BP (!) 169/60     Pulse Rate 85     Resp 18     Temp 98.6 F (37 C)     Temp Source Oral     SpO2 95 %     Weight 115 lb (52.2 kg)     Height 4\' 11"  (1.499 m)     Head Circumference      Peak Flow      Pain Score 0     Pain Loc      Pain Edu?      Excl. in [place]?      Physical Exam Vitals and nursing note reviewed.  Constitutional:      General: She is not in acute distress.    Appearance: She is well-developed.  HENT:     Head: Normocephalic and atraumatic.  Eyes:     Conjunctiva/sclera: Conjunctivae normal.  Cardiovascular:     Rate and Rhythm: Normal rate and regular rhythm.     Heart sounds: Normal heart sounds. No murmur heard. No friction rub.  Pulmonary:     Effort: Pulmonary effort is normal. Tachypnea present. No  respiratory distress.     Breath sounds: Examination of the right-upper field reveals wheezing. Examination of the left-upper field reveals wheezing. Examination of the right-middle field reveals wheezing. Examination of the left-middle field reveals wheezing. Examination of the right-lower field reveals wheezing. Examination of the left-lower field reveals wheezing. Decreased breath sounds and wheezing present. No rales.  Abdominal:     General: There is no distension.     Palpations: Abdomen is soft.     Tenderness: There is no abdominal tenderness.  Musculoskeletal:     Cervical back: Neck supple.  Skin:    General: Skin is warm.     Capillary Refill: Capillary refill takes less than 2 seconds.  Neurological:     Mental Status: She is alert and oriented to person, place, and time.     Motor: No abnormal muscle tone.       ____________________________________________   LABS (all labs ordered are listed, but only abnormal results are displayed)  Labs Reviewed  BASIC METABOLIC PANEL - Abnormal; Notable for the following components:      Result Value   Sodium 125 (*)    Potassium 3.2 (*)    Chloride 86 (*)    Glucose, Bld 108 (*)    All other components within normal limits  CBC - Abnormal; Notable for the following components:   WBC 14.0 (*)    RBC 3.58 (*)    Hemoglobin 11.5 (*)    HCT 32.6 (*)    All other components within normal limits  URINALYSIS, COMPLETE (UACMP) WITH MICROSCOPIC - Abnormal; Notable for the following components:   Color, Urine STRAW (*)    APPearance CLEAR (*)    Specific Gravity, Urine 1.004 (*)    Bacteria, UA RARE (*)    All other components within normal limits  RESP PANEL BY RT-PCR (FLU A&B, COVID) ARPGX2  HIV ANTIBODY (ROUTINE TESTING W REFLEX)    ____________________________________________  EKG: Normal sinus rhythm, VR 74. PR 146, QRS 92, QTc 421. No acute St elevations or  depressions. ________________________________________  RADIOLOGY All imaging, including plain films, CT scans, and ultrasounds, independently reviewed by me, and interpretations confirmed via formal radiology reads.  ED MD interpretation:   CXR: Increased bronchitis markings bilaterally  Official radiology report(s): DG Chest 2 View  Result Date: 02/17/2020 CLINICAL DATA:  Cough and shortness of breath for several days EXAM: CHEST - 2 VIEW COMPARISON:  07/01/2019 FINDINGS: Cardiac shadow is stable. Aortic calcifications are again noted. Increase in chronic scarring is noted bilaterally. Some increased bronchitic markings are noted centrally likely related to underlying bronchitis. No focal confluent infiltrate is seen. No effusion or pneumothorax is noted. Degenerative changes of the thoracic spine are seen. IMPRESSION: Increased bronchitic markings bilaterally consistent with acute bronchitis superimposed over more chronic fibrotic changes. Electronically Signed   By: Inez Catalina M.D.   On: 02/17/2020 10:09    ____________________________________________  PROCEDURES   Procedure(s) performed (including Critical Care):  .1-3 Lead EKG Interpretation Performed by: Duffy Bruce, MD Authorized by: Duffy Bruce, MD     Interpretation: normal     ECG rate:  80-100   ECG rate assessment: normal     Rhythm: sinus rhythm     Ectopy: none     Conduction: normal   Comments:     Indication: sob    ____________________________________________  INITIAL IMPRESSION / MDM / ASSESSMENT AND PLAN / ED COURSE  As part of my medical decision making, I reviewed the following data within the [place] notes reviewed and incorporated, Old chart reviewed, Notes from prior ED visits, and [place] Controlled Substance Database       *MOHOGANY TOPPINS was evaluated in Emergency Department on 02/17/2020 for the symptoms described in the history of present illness. She was  evaluated in the context of the global COVID-19 pandemic, which necessitated consideration that the patient might be at risk for infection with the SARS-CoV-2 virus that causes COVID-19. Institutional protocols and algorithms that pertain to the evaluation of patients at risk for COVID-19 are in a state of rapid change based on information released by regulatory bodies including the CDC and federal and state organizations. These policies and algorithms were followed during the patient's care in the ED.  Some ED evaluations and interventions may be delayed as a result of limited staffing during the pandemic.*  Clinical Course as of 02/17/20 1744  Mon Feb 17, 2020  1011 69 yo F here with cough, SOB. Clinically, suspect ongoing COPD exacerbation, DDx includes CAP, viral URI. Labs thus far show significant leukocytosis likely 2/2 infection as well as prednisone. CMP with marked hyponatremia, hypokalemia is likely 2/2 albuterol use. Hyponatremia is likely acute on chronic 2/2 her chronic lung disease and dehydration. IVF ordered. [CI]    Clinical Course User Index [CI] Duffy Bruce, MD    Medical Decision Making:  As above. 69 yo F here with COPD exacerbation, acute on chronic hyponatremia. Admit to medicine. See clinical course for additional MDM.  ____________________________________________  FINAL CLINICAL IMPRESSION(S) / ED DIAGNOSES  Final diagnoses:  COPD exacerbation ([place])     MEDICATIONS GIVEN DURING THIS VISIT:  Medications  albuterol (PROVENTIL,VENTOLIN) solution continuous neb (10 mg/hr Nebulization Not Given 02/17/20 1343)  aspirin EC tablet 81 mg (81 mg Oral Not Given 02/17/20 1450)  isosorbide mononitrate (IMDUR) 24 hr tablet 30 mg (30 mg Oral Not Given 02/17/20 1451)  losartan (COZAAR) tablet 50 mg (50 mg Oral Not Given 02/17/20 1451)  metoprolol succinate (TOPROL-XL) 24 hr tablet 50 mg (50 mg Oral Not  Given 02/17/20 1452)  pantoprazole (PROTONIX) EC tablet 40 mg (40 mg Oral  Not Given 02/17/20 1452)  clopidogrel (PLAVIX) tablet 75 mg (75 mg Oral Not Given 02/17/20 1451)  calcium carbonate (OS-CAL - dosed in mg of elemental calcium) tablet 1,250 mg (1,800 mg Oral Not Given 02/17/20 1450)  cetirizine (ZYRTEC) tablet 10 mg (10 mg Oral Not Given 02/17/20 1451)  enoxaparin (LOVENOX) injection 40 mg (has no administration in time range)  0.9 % NaCl with KCl 40 mEq / L  infusion ( Intravenous New Bag/Given 02/17/20 1525)  doxycycline (VIBRA-TABS) tablet 100 mg (100 mg Oral Given 02/17/20 1444)  ipratropium-albuterol (DUONEB) 0.5-2.5 (3) MG/3ML nebulizer solution 3 mL (0 mLs Nebulization Hold 02/17/20 1232)  albuterol (PROVENTIL) (2.5 MG/3ML) 0.083% nebulizer solution 2.5 mg (has no administration in time range)  methylPREDNISolone sodium succinate (SOLU-MEDROL) 40 mg/mL injection 40 mg (has no administration in time range)    Followed by  predniSONE (DELTASONE) tablet 40 mg (has no administration in time range)  nicotine (NICODERM CQ - dosed in mg/24 hours) patch 21 mg (21 mg Transdermal Patch Applied 02/17/20 1444)  atorvastatin (LIPITOR) tablet 40 mg (has no administration in time range)  mometasone-formoterol (DULERA) 200-5 MCG/ACT inhaler 2 puff (has no administration in time range)  fluticasone (FLONASE) 50 MCG/ACT nasal spray 2 spray (has no administration in time range)  methylPREDNISolone sodium succinate (SOLU-MEDROL) 125 mg/2 mL injection 125 mg (125 mg Intravenous Given 02/17/20 1005)  sodium chloride 0.9 % bolus 1,000 mL (0 mLs Intravenous Stopped 02/17/20 1144)  ipratropium (ATROVENT) nebulizer solution 1 mg (1 mg Nebulization Given 02/17/20 1005)  ipratropium-albuterol (DUONEB) 0.5-2.5 (3) MG/3ML nebulizer solution 3 mL (3 mLs Nebulization Given 02/17/20 1201)  cefTRIAXone (ROCEPHIN) 2 g in sodium chloride 0.9 % 100 mL IVPB (0 g Intravenous Stopped 02/17/20 1241)  azithromycin (ZITHROMAX) 500 mg in sodium chloride 0.9 % 250 mL IVPB (0 mg Intravenous Stopped  02/17/20 1408)  potassium chloride 10 mEq in 100 mL IVPB (0 mEq Intravenous Stopped 02/17/20 1508)     ED Discharge Orders    None       Note:  This document was prepared using Dragon voice recognition software and may include unintentional dictation errors.   Duffy Bruce, MD 02/17/20 1744

## 2020-02-18 ENCOUNTER — Encounter: Payer: Self-pay | Admitting: Internal Medicine

## 2020-02-18 DIAGNOSIS — E871 Hypo-osmolality and hyponatremia: Secondary | ICD-10-CM

## 2020-02-18 DIAGNOSIS — J9601 Acute respiratory failure with hypoxia: Secondary | ICD-10-CM

## 2020-02-18 DIAGNOSIS — E876 Hypokalemia: Secondary | ICD-10-CM

## 2020-02-18 LAB — BASIC METABOLIC PANEL
Anion gap: 10 (ref 5–15)
BUN: 10 mg/dL (ref 8–23)
CO2: 26 mmol/L (ref 22–32)
Calcium: 9 mg/dL (ref 8.9–10.3)
Chloride: 95 mmol/L — ABNORMAL LOW (ref 98–111)
Creatinine, Ser: 0.63 mg/dL (ref 0.44–1.00)
GFR, Estimated: >60 mL/min (ref >60)
Glucose, Bld: 133 mg/dL — ABNORMAL HIGH (ref 70–99)
Potassium: 4.3 mmol/L (ref 3.5–5.1)
Sodium: 131 mmol/L — ABNORMAL LOW (ref 135–145)

## 2020-02-18 LAB — MAGNESIUM: Magnesium: 1.7 mg/dL (ref 1.7–2.4)

## 2020-02-18 LAB — HIV ANTIBODY (ROUTINE TESTING W REFLEX): HIV Screen 4th Generation wRfx: NONREACTIVE

## 2020-02-18 MED ORDER — ENSURE ENLIVE PO LIQD
237.0000 mL | Freq: Two times a day (BID) | ORAL | Status: DC
  Administered 2020-02-19 – 2020-02-20 (×3): 237 mL via ORAL

## 2020-02-18 MED ORDER — ACETAMINOPHEN 325 MG PO TABS
650.0000 mg | ORAL_TABLET | Freq: Four times a day (QID) | ORAL | Status: DC | PRN
  Administered 2020-02-19 (×2): 650 mg via ORAL
  Filled 2020-02-18 (×2): qty 2

## 2020-02-18 MED ORDER — NYSTATIN 100000 UNIT/ML MT SUSP
5.0000 mL | Freq: Four times a day (QID) | OROMUCOSAL | Status: DC
  Administered 2020-02-18 – 2020-02-20 (×7): 500000 [IU] via ORAL
  Filled 2020-02-18 (×6): qty 5

## 2020-02-18 MED ORDER — DM-GUAIFENESIN ER 30-600 MG PO TB12
1.0000 | ORAL_TABLET | Freq: Two times a day (BID) | ORAL | Status: DC | PRN
  Administered 2020-02-19: 02:00:00 1 via ORAL
  Filled 2020-02-18 (×2): qty 1

## 2020-02-18 NOTE — TOC Initial Note (Addendum)
Transition of Care [facility]) - Initial/Assessment Note    Patient Details  Name: Kristi Clark MRN: [id number] Date of Birth: 16-Mar-1950  Transition of Care [facility]) CM/SW Contact:    Magnus Ivan, LCSW Phone Number: 02/18/2020, 2:26 PM  Clinical Narrative:                CSW spoke with patient. Patient reported she lives alone and drives herself to appointments. PCP is [facility]. Pharmacy is Paediatric nurse on [address]. Patient has a shower chair, RW, BSC, cane, w/c, and ramp. No HH or SNF history. Patient agreeable to [facility] services being arranged.CSW arranged HHRN and PT services with with Advanced Representative Corene Cornea. RN put in note for home o2, CSW made home o2 referral to [facility] with [facility]. Asked today's RN to put specific number for sats as "mid 80s" is not sufficient per Thedore Mins for insurance reasons.    Expected Discharge Plan: [place] Barriers to Discharge: Continued Medical Work up   Patient Goals and CMS Choice Patient states their goals for this hospitalization and ongoing recovery are:: home with home health CMS Medicare.gov Compare Post Acute Care list provided to:: Patient Choice offered to / list presented to : Patient  Expected Discharge Plan and Services Expected Discharge Plan: [place]       Living arrangements for the past 2 months: Single Family Home                 DME Arranged: [employer] DME Agency: [employer] Date DME Agency Contacted: 02/18/20   Representative spoke with at DME Agency: [place]: PT,OT,RN          Prior Living Arrangements/Services Living arrangements for the past 2 months: Single Family Home Lives with:: Self Patient language and need for interpreter reviewed:: Yes Do you feel safe going back to the place where you live?: Yes      Need for Family Participation in Patient Care: Yes (Comment) Care giver support system in place?: Yes (comment) Current home services: DME Criminal  Activity/Legal Involvement Pertinent to Current Situation/Hospitalization: No - Comment as needed  Activities of Daily Living Home Assistive Devices/Equipment: None ADL Screening (condition at time of admission) Patient's cognitive ability adequate to safely complete daily activities?: Yes Is the patient deaf or have difficulty hearing?: No Does the patient have difficulty seeing, even when wearing glasses/contacts?: No Does the patient have difficulty concentrating, remembering, or making decisions?: No Patient able to express need for assistance with ADLs?: Yes Does the patient have difficulty dressing or bathing?: No Independently performs ADLs?: Yes (appropriate for developmental age) Does the patient have difficulty walking or climbing stairs?: No Weakness of Legs: None Weakness of Arms/Hands: None  Permission Sought/Granted Permission sought to share information with : Chartered certified accountant granted to share information with : Yes, Verbal Permission Granted     Permission granted to share info w AGENCY: HH, DME        Emotional Assessment       Orientation: : Oriented to Self,Oriented to Place,Oriented to  Time,Oriented to Situation Alcohol / Substance Use: Not Applicable Psych Involvement: No (comment)  Admission diagnosis:  COPD exacerbation ([place]) [J44.1] COPD with acute exacerbation ([place]) [J44.1] Patient Active Problem List   Diagnosis Date Noted  . COPD with acute exacerbation ([place]) 02/17/2020  . Hyponatremia 02/17/2020  . Hypokalemia 02/17/2020  . Respiratory failure, acute ([place]) 02/17/2020  . Nicotine dependence 02/17/2020  . Postherpetic neuralgia 11/24/2019  .  Cough 10/29/2019  . Tobacco abuse 10/17/2017  . IBS (irritable bowel syndrome) 04/04/2017  . Advanced care planning/counseling discussion 10/03/2016  . CAD (coronary artery disease) 10/03/2016  . History of anemia 10/03/2016  . Benign hypertension with chronic kidney disease  03/06/2015  . Allergic rhinitis 09/03/2014  . 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 GERD (gastroesophageal reflux disease) 09/03/2014  . Hyperlipidemia 09/03/2014  . Anemia 07/14/2014  . COPD, severe ([place]) 08/08/2013  . Pulmonary nodules 08/08/2013   PCP:  [name], DO Pharmacy:   [facility] [place], [place] - [address] [place] [address] Phone: [phone] Fax: [phone]  [facility] [address]), [place] [address] [place]) [place] [address] Phone: [phone] Fax: [phone]     Social Determinants of Health (SDOH) Interventions    Readmission Risk Interventions No flowsheet data found.

## 2020-02-18 NOTE — Evaluation (Signed)
Occupational Therapy Evaluation Patient Details Name: Kristi Clark MRN: [id number] DOB: 21-Nov-1950 Today's Date: 02/18/2020    History of Present Illness Pt is a 69 y.o. female with medical history significant for COPD, depression, GERD, hypertension, CKD III, and osteopenia who presented to the emergency room for evaluation of shortness of breath.  MD assessment inlcudes: COPD with acute exacerbation, acute respiratory failure, hypnatremia, and hypkalemia.   Clinical Impression   Kristi Clark was seen for OT evaluation this date. Pt reports she was active and independent in all ADL and functional mobility prior to admission. She reports living in a 1 story home with a ramped entrance. Pt not on supplemental O2 at baseline. Pt reports becoming easily fatigued or out of breath with minimal exertion over the last week. Pt currently requires SUPERVISION for safety during exertional ADL tasks such as bathing or LB dressing due to current functional impairments (See OT Problem List below). Pt educated in energy conservation strategies including pursed lip breathing, activity pacing, home/routines modifications, work simplification, AE/DME, prioritizing of meaningful occupations, and falls prevention. Handout provided. Pt verbalized understanding and would benefit from additional skilled OT services to maximize recall and carryover of learned techniques and facilitate implementation of learned techniques into daily routines. Do not anticipate the need for follow-up OT services upon hospital DC.      Follow Up Recommendations  No OT follow up    Equipment Recommendations  None recommended by OT (Pt has necessary equipment.)    Recommendations for Other Services       Precautions / Restrictions Precautions Precautions: Fall Precaution Comments: Low Fall Restrictions Weight Bearing Restrictions: No Other Position/Activity Restrictions: HOB >/= 30 deg, SpO2 >/= 92%      Mobility Bed  Mobility Overal bed mobility: Modified Independent             General bed mobility comments: Increased time/effort to perform    Transfers Overall transfer level: Needs assistance Equipment used: Rolling walker (2 wheeled) Transfers: Sit to/from Stand Sit to Stand: Supervision         General transfer comment: Good safety awareness and control during STS from EOB. Does not require a device.    Balance Overall balance assessment: Needs assistance   Sitting balance-Leahy Scale: Normal Sitting balance - Comments: Steady static/dynamic sitting, reaching within BOS.   Standing balance support: No upper extremity supported;During functional activity Standing balance-Leahy Scale: Good Standing balance comment: Steady static standing w/o UE support. Using IV pole for functional mobility in room.                           ADL either performed or assessed with clinical judgement   ADL Overall ADL's : Needs assistance/impaired                                     Functional mobility during ADLs: Modified independent General ADL Comments: Pt functionally limited by cardiopulmonary status and generalized weakness. Requires SUPERVISION for safety during exertional ADL management including bathing and LB dressing. Currently MOD I for funcitonal mobility in room using IV pole. Up ad lib using the bathroom, but reports she becomes easily SOB with activity. Currently on 2L [place], and is not a baseline O2 user at home.     Vision Baseline Vision/History: Wears glasses Wears Glasses: At all times Patient Visual Report: No change from baseline  Perception     Praxis      Pertinent Vitals/Pain Pain Assessment: No/denies pain     Hand Dominance Right   Extremity/Trunk Assessment Upper Extremity Assessment Upper Extremity Assessment: Generalized weakness   Lower Extremity Assessment Lower Extremity Assessment: Generalized weakness   Cervical /  Trunk Assessment Cervical / Trunk Assessment: Normal   Communication Communication Communication: No difficulties   Cognition Arousal/Alertness: Awake/alert Behavior During Therapy: WFL for tasks assessed/performed Overall Cognitive Status: Within Functional Limits for tasks assessed                                 General Comments: Pleasant, conversational, eager to participate in OT evaluation.   General Comments  Pt on 2L [place] t/o session. Vitals remain WFL with sO2 noted to be 98% during functional mobility.    Exercises Other Exercises Other Exercises: Pt/caregiver (granddaughter at bedside t/o session) educated on role of OT in acute setting, energy conservation strategies including The 4 P's with handout provided, and routines modifications to support safety/smoking cessation upon hospital DC.   Shoulder Instructions      Home Living Family/patient expects to be discharged to:: Private residence Living Arrangements: Alone Available Help at Discharge: Family;Friend(s);Available 24 hours/day Type of Home: House Home Access: Ramped entrance     Home Layout: One level     Bathroom Shower/Tub: Teacher, early years/pre: Handicapped height     Home Equipment: Bedside commode;Walker - 4 wheels;Shower seat;Hand held shower head          Prior Functioning/Environment Level of Independence: Independent        Comments: Ind community ambulator without an AD. Denies fall history in last year, Ind with ADLs, does not use O2 at baseline. Pt states she cares for her great grandchildren, is generally very active and independent.        OT Problem List: Cardiopulmonary status limiting activity;Decreased safety awareness;Decreased knowledge of use of DME or AE;Decreased strength      OT Treatment/Interventions: Self-care/ADL training;Therapeutic exercise;Therapeutic activities;Energy conservation;DME and/or AE instruction;Patient/family education     OT Goals(Current goals can be found in the care plan section) Acute Rehab OT Goals Patient Stated Goal: To get back to doing what I want to do OT Goal Formulation: With patient Time For Goal Achievement: 03/03/20 Potential to Achieve Goals: Good ADL Goals Pt Will Perform Grooming: standing;with modified independence (c LRAD PRN for improved safety and functional indep) Pt Will Transfer to Toilet: bedside commode;regular height toilet;with modified independence (c LRAD PRN for improved safety and functional indep) Pt Will Perform Toileting - Clothing Manipulation and hygiene: sit to/from stand;with modified independence (c LRAD PRN for improved safety and functional indep)  OT Frequency: Min 1X/week   Barriers to D/C:            Co-evaluation              AM-PAC OT "6 Clicks" Daily Activity     Outcome Measure Help from another person eating meals?: None Help from another person taking care of personal grooming?: None Help from another person toileting, which includes using toliet, bedpan, or urinal?: None Help from another person bathing (including washing, rinsing, drying)?: A Little Help from another person to put on and taking off regular upper body clothing?: None Help from another person to put on and taking off regular lower body clothing?: A Little 6 Click Score: 22   End of  Session Equipment Utilized During Treatment: Rolling walker  Activity Tolerance: Patient tolerated treatment well Patient left: in bed;with call bell/phone within reach;Other (comment) (With RT in room for breathing treatment.)  OT Visit Diagnosis: Other abnormalities of gait and mobility (R26.89);Muscle weakness (generalized) (M62.81)                Time: 9179-1505 OT Time Calculation (min): 17 min Charges:  OT General Charges $OT Visit: 1 Visit OT Evaluation $OT Eval Low Complexity: 1 Low OT Treatments $Self Care/Home Management : 8-22 mins  Shara Blazing, M.S., OTR/L Ascom:  [phone] 02/18/20, 2:31 PM

## 2020-02-18 NOTE — Progress Notes (Signed)
Initial Nutrition Assessment  DOCUMENTATION CODES:   Not applicable  INTERVENTION:  Recommend liberalizing diet to regular.  Provide Ensure Enlive po BID, each supplement provides 350 kcal and 20 grams of protein. Patient prefers chocolate.  NUTRITION DIAGNOSIS:   Increased nutrient needs related to catabolic illness (COPD) as evidenced by estimated needs.  GOAL:   Patient will meet greater than or equal to 90% of their needs  MONITOR:   PO intake,Supplement acceptance,Labs,Weight trends,I & O's  REASON FOR ASSESSMENT:   Consult Assessment of nutrition requirement/status  ASSESSMENT:   69 year old female with PMHx of depression, HLD, HTN, GERD, COPD admitted with acute exacerbation of COPD.   Met with patient and her granddaughter at bedside. Patient reports she has had a decreased appetite and intake for a while now. 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 She now continues to eat less at meals. Per chart patient ate 50% of her lunch. She is amenable to drinking oral nutrition supplements to help meet calorie/protein needs.  Patient reports her UBW was 130-140 lbs. She lost weight over time and is now down to around 115 lbs. Per chart patient was 124 lbs on 06/19/2019. Patient currently 52.7 kg (116.18 lbs). She has lost approximately 8 lbs (6.5% body weight) over the past 8 months, which is not significant for time frame.  Medications reviewed and include: calcium carbonate 1250 mg daily, doxycycline, nicotine, Protonix, prednisone 40 mg daily, NS with KCl 40 mEq/L at 50 mL/hr.  Labs reviewed: Sodium 131, Chloride 95.  Patient does not meet criteria for malnutrition at this time but is at risk for malnutrition.  NUTRITION - FOCUSED PHYSICAL EXAM:  Flowsheet Row Most Recent Value  Orbital Region No depletion  Upper Arm Region Moderate depletion  Thoracic and Lumbar Region No depletion  Buccal Region No depletion  Temple Region No  depletion  Clavicle Bone Region No depletion  Clavicle and Acromion Bone Region Mild depletion  Scapular Bone Region No depletion  Dorsal Hand Moderate depletion  Patellar Region Moderate depletion  Anterior Thigh Region Moderate depletion  Posterior Calf Region Moderate depletion  Edema (RD Assessment) None  Hair Reviewed  Eyes Reviewed  Mouth Reviewed  Skin Reviewed  Nails Reviewed     Diet Order:   Diet Order            Diet 2 gram sodium Room service appropriate? Yes; Fluid consistency: Thin  Diet effective now                EDUCATION NEEDS:   No education needs have been identified at this time  Skin:  Skin Assessment: Reviewed RN Assessment  Last BM:  02/16/2020 per chart  Height:   Ht Readings from Last 1 Encounters:  02/17/20 4' 11" (1.499 m)   Weight:   Wt Readings from Last 1 Encounters:  02/17/20 52.7 kg   BMI:  Body mass index is 23.47 kg/m.  Estimated Nutritional Needs:   Kcal:  1500-1700  Protein:  75-85 grams  Fluid:  1.3 L/day   King, MS, RD, LDN Pager number available on Amion 

## 2020-02-18 NOTE — Progress Notes (Signed)
Progress Note    Kristi Clark  AYT:[id number] DOB: [birth year]  DOA: 02/17/2020 PCP: [name], DO      Brief Narrative:    Medical records reviewed and are as summarized below:  Kristi Clark is a 69 y.o. female significant for COPD, depression, GERD, hypertension, CAD, who presented to the hospital with cough, congestion, wheezing, shortness of breath and fever with T-max of 101 F.  Chest x-ray showed acute bronchitis.  She was admitted to the hospital for acute bronchitis and acute exacerbation of COPD.  She was treated with steroids, bronchodilators and antibiotics.  She required 2 L/min oxygen via nasal cannula for acute hypoxic respiratory failure.  She also had hyponatremia that was treated with IV fluids.  She had hypokalemia that was repleted as well.      Assessment/Plan:   Principal Problem:   COPD with acute exacerbation (HCC) Active Problems:   Benign hypertension with chronic kidney disease   CAD (coronary artery disease)   Hyponatremia   Hypokalemia   Respiratory failure, acute (HCC)   Nicotine dependence   Body mass index is 23.47 kg/m.    Acute COPD exacerbation: Continue doxycycline, steroids and bronchodilators.  Acute hypoxic respiratory failure: Continue oxygen via nasal cannula and taper off oxygen as able.  Hyponatremia: Sodium level is improving.  Continue IV fluids and monitor sodium level..  Sodium level was 136 on 12/23/2019.  Hypokalemia: Improved  CAD, hypertension: Continue aspirin, Plavix, Lipitor and antihypertensives  Tobacco use disorder: Counseled to quit smoking cigarettes.  Diet Order            Diet 2 gram sodium Room service appropriate? Yes; Fluid consistency: Thin  Diet effective now                    Consultants:  None  Procedures:  None    Medications:   . albuterol  10 mg/hr Nebulization Once  . aspirin EC  81 mg Oral Daily  . atorvastatin  40 mg Oral Daily  . calcium  carbonate  1,250 mg Oral Daily  . cetirizine  10 mg Oral Daily  . clopidogrel  75 mg Oral Daily  . doxycycline  100 mg Oral Q12H  . enoxaparin (LOVENOX) injection  40 mg Subcutaneous Q24H  . fluticasone  2 spray Each Nare Daily  . ipratropium-albuterol  3 mL Nebulization Q6H  . isosorbide mononitrate  30 mg Oral Daily  . losartan  50 mg Oral Daily  . metoprolol succinate  50 mg Oral Daily  . mometasone-formoterol  2 puff Inhalation BID  . nicotine  21 mg Transdermal Daily  . pantoprazole  40 mg Oral Daily  . [START ON 02/19/2020] predniSONE  40 mg Oral Q breakfast   Continuous Infusions: . 0.9 % NaCl with KCl 40 mEq / L 100 mL/hr at 02/18/20 0122     Anti-infectives (From admission, onward)   Start     Dose/Rate Route Frequency Ordered Stop   02/17/20 1300  doxycycline (VIBRA-TABS) tablet 100 mg        100 mg Oral Every 12 hours 02/17/20 1209 02/22/20 0959   02/17/20 1145  cefTRIAXone (ROCEPHIN) 2 g in sodium chloride 0.9 % 100 mL IVPB        2 g 200 mL/hr over 30 Minutes Intravenous  Once 02/17/20 1141 02/17/20 1241   02/17/20 1145  azithromycin (ZITHROMAX) 500 mg in sodium chloride 0.9 % 250 mL IVPB  500 mg 250 mL/hr over 60 Minutes Intravenous  Once 02/17/20 1141 02/17/20 1408             Family Communication/Anticipated D/C date and plan/Code Status   DVT prophylaxis: enoxaparin (LOVENOX) injection 40 mg Start: 02/17/20 2200     Code Status: DNR  Family Communication: None Disposition Plan:    Status is: Inpatient  Remains inpatient appropriate because:Inpatient level of care appropriate due to severity of illness   Dispo: The patient is from: Home              Anticipated d/c is to: Home              Anticipated d/c date is: 1 day              Patient currently is not medically stable to d/c.           Subjective:   C/o shortness of breath with minimal exertion leading to drop in oxygen saturation.  She said she had to be placed on 2  L oxygen when she went to the bathroom.  Objective:    Vitals:   02/17/20 1922 02/17/20 2127 02/18/20 0529 02/18/20 0747  BP: (!) 159/121 (!) 147/64 (!) 145/68 (!) 150/58  Pulse: 96  78 94  Resp: 18  18 16   Temp: 98 F (36.7 C)  98.2 F (36.8 C) (!) 96.8 F (36 C)  TempSrc:    Axillary  SpO2: 100%  98% 100%  Weight:      Height:       No data found.   Intake/Output Summary (Last 24 hours) at 02/18/2020 1113 Last data filed at 02/18/2020 0300 Gross per 24 hour  Intake 1844.52 ml  Output --  Net 1844.52 ml   Filed Weights   02/17/20 0830 02/17/20 1755  Weight: 52.2 kg 52.7 kg    Exam:  GEN: NAD SKIN: Warm and dry EYES: No pallor or icterus ENT: MMM CV: RRR PULM: CTA B ABD: soft, ND, NT, +BS CNS: AAO x 3, non focal EXT: No edema or tenderness   Data Reviewed:   I have personally reviewed following labs and imaging studies:  Labs: Labs show the following:   Basic Metabolic Panel: Recent Labs  Lab 02/17/20 0834 02/18/20 0825  NA 125* 131*  K 3.2* 4.3  CL 86* 95*  CO2 27 26  GLUCOSE 108* 133*  BUN 13 10  CREATININE 0.84 0.63  CALCIUM 9.5 9.0  MG  --  1.7   GFR Estimated Creatinine Clearance: 47.9 mL/min (by C-G formula based on SCr of 0.63 mg/dL). Liver Function Tests: No results for input(s): AST, ALT, ALKPHOS, BILITOT, PROT, ALBUMIN in the last 168 hours. No results for input(s): LIPASE, AMYLASE in the last 168 hours. No results for input(s): AMMONIA in the last 168 hours. Coagulation profile No results for input(s): INR, PROTIME in the last 168 hours.  CBC: Recent Labs  Lab 02/17/20 0834  WBC 14.0*  HGB 11.5*  HCT 32.6*  MCV 91.1  PLT 279   Cardiac Enzymes: No results for input(s): CKTOTAL, CKMB, CKMBINDEX, TROPONINI in the last 168 hours. BNP (last 3 results) No results for input(s): PROBNP in the last 8760 hours. CBG: No results for input(s): GLUCAP in the last 168 hours. D-Dimer: No results for input(s): DDIMER in the last  72 hours. Hgb A1c: No results for input(s): HGBA1C in the last 72 hours. Lipid Profile: No results for input(s): CHOL, HDL, LDLCALC, TRIG, CHOLHDL, LDLDIRECT  in the last 72 hours. Thyroid function studies: No results for input(s): TSH, T4TOTAL, T3FREE, THYROIDAB in the last 72 hours.  Invalid input(s): FREET3 Anemia work up: No results for input(s): VITAMINB12, FOLATE, FERRITIN, TIBC, IRON, RETICCTPCT in the last 72 hours. Sepsis Labs: Recent Labs  Lab 02/17/20 0834  WBC 14.0*    Microbiology Recent Results (from the past 240 hour(s))  Novel Coronavirus, NAA ([facility])     Status: None   Collection Time: 02/14/20  3:03 PM   Specimen: Saline  Result Value Ref Range Status   SARS-CoV-2, NAA Not Detected Not Detected Final    Comment: This nucleic acid amplification test was developed and its performance characteristics determined by [employer]. Nucleic acid amplification tests include RT-PCR and TMA. This test has not been FDA cleared or approved. This test has been authorized by FDA under an Emergency Use Authorization (EUA). This test is only authorized for the duration of time the declaration that circumstances exist justifying the authorization of the emergency use of in vitro diagnostic tests for detection of SARS-CoV-2 virus and/or diagnosis of COVID-19 infection under section 564(b)(1) of the Act, 21 U.S.C. 786VEH-2(C) (1), unless the authorization is terminated or revoked sooner. When diagnostic testing is negative, the possibility of a false negative result should be considered in the context of a patient's recent exposures and the presence of clinical signs and symptoms consistent with COVID-19. An individual without symptoms of COVID-19 and who is not shedding SARS-CoV-2 virus wo uld expect to have a negative (not detected) result in this assay.   SARS-COV-2, NAA 2 DAY TAT     Status: None   Collection Time: 02/14/20  3:03 PM  Result Value Ref Range  Status   SARS-CoV-2, NAA 2 DAY TAT Performed  Final  Resp Panel by RT-PCR (Flu A&B, Covid) Nasopharyngeal Swab     Status: None   Collection Time: 02/17/20 10:22 AM   Specimen: Nasopharyngeal Swab; Nasopharyngeal(NP) swabs in vial transport medium  Result Value Ref Range Status   SARS Coronavirus 2 by RT PCR NEGATIVE NEGATIVE Final    Comment: (NOTE) SARS-CoV-2 target nucleic acids are NOT DETECTED.  The SARS-CoV-2 RNA is generally detectable in upper respiratory specimens during the acute phase of infection. The lowest concentration of SARS-CoV-2 viral copies this assay can detect is 138 copies/mL. A negative result does not preclude SARS-Cov-2 infection and should not be used as the sole basis for treatment or other patient management decisions. A negative result may occur with  improper specimen collection/handling, submission of specimen other than nasopharyngeal swab, presence of viral mutation(s) within the areas targeted by this assay, and inadequate number of viral copies(<138 copies/mL). A negative result must be combined with clinical observations, patient history, and epidemiological information. The expected result is Negative.  Fact Sheet for Patients:  EntrepreneurPulse.com.au  Fact Sheet for Healthcare Providers:  IncredibleEmployment.be  This test is no t yet approved or cleared by the [place] FDA and  has been authorized for detection and/or diagnosis of SARS-CoV-2 by FDA under an Emergency Use Authorization (EUA). This EUA will remain  in effect (meaning this test can be used) for the duration of the COVID-19 declaration under Section 564(b)(1) of the Act, 21 U.S.C.section 360bbb-3(b)(1), unless the authorization is terminated  or revoked sooner.       Influenza A by PCR NEGATIVE NEGATIVE Final   Influenza B by PCR NEGATIVE NEGATIVE Final    Comment: (NOTE) The Xpert Xpress SARS-CoV-2/FLU/RSV plus assay is intended  as an aid in the diagnosis of influenza from Nasopharyngeal swab specimens and should not be used as a sole basis for treatment. Nasal washings and aspirates are unacceptable for Xpert Xpress SARS-CoV-2/FLU/RSV testing.  Fact Sheet for Patients: EntrepreneurPulse.com.au  Fact Sheet for Healthcare Providers: IncredibleEmployment.be  This test is not yet approved or cleared by the [place] FDA and has been authorized for detection and/or diagnosis of SARS-CoV-2 by FDA under an Emergency Use Authorization (EUA). This EUA will remain in effect (meaning this test can be used) for the duration of the COVID-19 declaration under Section 564(b)(1) of the Act, 21 U.S.C. section 360bbb-3(b)(1), unless the authorization is terminated or revoked.  Performed at [facility], [address] [place], [place] [address]     Procedures and diagnostic studies:  DG Chest 2 View  Result Date: 02/17/2020 CLINICAL DATA:  Cough and shortness of breath for several days EXAM: CHEST - 2 VIEW COMPARISON:  07/01/2019 FINDINGS: Cardiac shadow is stable. Aortic calcifications are again noted. Increase in chronic scarring is noted bilaterally. Some increased bronchitic markings are noted centrally likely related to underlying bronchitis. No focal confluent infiltrate is seen. No effusion or pneumothorax is noted. Degenerative changes of the thoracic spine are seen. IMPRESSION: Increased bronchitic markings bilaterally consistent with acute bronchitis superimposed over more chronic fibrotic changes. Electronically Signed   By: Inez Catalina M.D.   On: 02/17/2020 10:09               LOS: 1 day   Raylon Lamson  [facility]   Pager on [email or web address]. If 7PM-7AM, please contact night-coverage at [email or web address]     02/18/2020, 11:13 AM

## 2020-02-19 DIAGNOSIS — J441 Chronic obstructive pulmonary disease with (acute) exacerbation: Principal | ICD-10-CM

## 2020-02-19 LAB — CBC WITH DIFFERENTIAL/PLATELET
Abs Immature Granulocytes: 0.11 10*3/uL — ABNORMAL HIGH (ref 0.00–0.07)
Basophils Absolute: 0 10*3/uL (ref 0.0–0.1)
Basophils Relative: 0 %
Eosinophils Absolute: 0 10*3/uL (ref 0.0–0.5)
Eosinophils Relative: 0 %
HCT: 28 % — ABNORMAL LOW (ref 36.0–46.0)
Hemoglobin: 9.5 g/dL — ABNORMAL LOW (ref 12.0–15.0)
Immature Granulocytes: 1 %
Lymphocytes Relative: 15 %
Lymphs Abs: 2.2 10*3/uL (ref 0.7–4.0)
MCH: 31.8 pg (ref 26.0–34.0)
MCHC: 33.9 g/dL (ref 30.0–36.0)
MCV: 93.6 fL (ref 80.0–100.0)
Monocytes Absolute: 0.9 10*3/uL (ref 0.1–1.0)
Monocytes Relative: 6 %
Neutro Abs: 11.8 10*3/uL — ABNORMAL HIGH (ref 1.7–7.7)
Neutrophils Relative %: 78 %
Platelets: 260 10*3/uL (ref 150–400)
RBC: 2.99 MIL/uL — ABNORMAL LOW (ref 3.87–5.11)
RDW: 13.1 % (ref 11.5–15.5)
WBC: 15 10*3/uL — ABNORMAL HIGH (ref 4.0–10.5)
nRBC: 0 % (ref 0.0–0.2)

## 2020-02-19 LAB — BASIC METABOLIC PANEL
Anion gap: 6 (ref 5–15)
BUN: 13 mg/dL (ref 8–23)
CO2: 27 mmol/L (ref 22–32)
Calcium: 8.8 mg/dL — ABNORMAL LOW (ref 8.9–10.3)
Chloride: 98 mmol/L (ref 98–111)
Creatinine, Ser: 0.62 mg/dL (ref 0.44–1.00)
GFR, Estimated: >60 mL/min (ref >60)
Glucose, Bld: 102 mg/dL — ABNORMAL HIGH (ref 70–99)
Potassium: 4.6 mmol/L (ref 3.5–5.1)
Sodium: 131 mmol/L — ABNORMAL LOW (ref 135–145)

## 2020-02-19 MED ORDER — GUAIFENESIN-CODEINE 100-10 MG/5ML PO SOLN
5.0000 mL | Freq: Four times a day (QID) | ORAL | Status: DC
Start: 2020-02-19 — End: 2020-02-20
  Administered 2020-02-19 – 2020-02-20 (×4): 5 mL via ORAL
  Filled 2020-02-19 (×3): qty 5

## 2020-02-19 MED ORDER — METHYLPREDNISOLONE SODIUM SUCC 40 MG IJ SOLR
40.0000 mg | Freq: Two times a day (BID) | INTRAMUSCULAR | Status: DC
  Administered 2020-02-19 (×2): 40 mg via INTRAVENOUS
  Filled 2020-02-19 (×2): qty 1

## 2020-02-19 MED ORDER — BUDESONIDE 0.25 MG/2ML IN SUSP
0.2500 mg | Freq: Two times a day (BID) | RESPIRATORY_TRACT | Status: DC
  Administered 2020-02-19 – 2020-02-20 (×2): 0.25 mg via RESPIRATORY_TRACT
  Filled 2020-02-19 (×2): qty 2

## 2020-02-19 NOTE — Progress Notes (Signed)
PROGRESS NOTE    Kristi Clark  ZOX:[id number] DOB: 09-01-1950 DOA: 02/17/2020 PCP: [name], DO   Chief Complain:SOB  Brief Narrative: Patient is a 63-year female with history of COPD, depression, GERD, hypertension, coronary artery disease who presented with complaints of cough, congestion, wheezing, shortness of breath and fever.  Chest x-ray was suggestive of acute bronchitis.  She was admitted for the management of acute COPD exacerbation/bronchitis and was started on IV steroids.  Hospital course remarkable for persistent cough and shortness of breath.   Assessment & Plan:   Principal Problem:   COPD with acute exacerbation ([place]) Active Problems:   Benign hypertension with chronic kidney disease   CAD (coronary artery disease)   Hyponatremia   Hypokalemia   Respiratory failure, acute (HCC)   Nicotine dependence   Acute on chronic hypoxic respiratory failure: Secondary to COPD exacerbation.  Continue supplemental oxygen.  She is not on oxygen at home.  Most likely she will need oxygen on discharge.  COPD exacerbation: Smoker.  Not using oxygen at home.  Use inhalers.  Follows with pulmonology.  Continue IV steroids, bronchodilators.  We will add Pulmicort.  Continue doxycycline.  Continue current medications.  Hyponatremia/hypokalemia: Improved  Coronary artery disease: Takes aspirin, Plavix, Lipitor at home which we will continue  Hypertension: Currently well stable.  Continue current medications  Tobacco use: Counseled for cessation  Leukocytosis: Most likely secondary to steroids.  Continue to monitor  Normocytic anemia: Hemoglobin dropped from 11.5 to 9.5 most likely from hemodilution.  Continue to monitor CBC.  Debility/deconditioning: We will request for PT evaluation.  Patient lives alone        Nutrition Problem: Increased nutrient needs Etiology: catabolic illness (COPD)      DVT prophylaxis: Lovenox Code Status: DNR Family  Communication: None at the bedside Status is: Inpatient  Remains inpatient appropriate because:Inpatient level of care appropriate due to severity of illness   Dispo: The patient is from: Home              Anticipated d/c is to: Home              Anticipated d/c date is: 1 day              Patient currently is not medically stable to d/c.        Consultants: None  Procedures: None  Antimicrobials:  Anti-infectives (From admission, onward)   Start     Dose/Rate Route Frequency Ordered Stop   02/17/20 1300  doxycycline (VIBRA-TABS) tablet 100 mg        100 mg Oral Every 12 hours 02/17/20 1209 02/22/20 0959   02/17/20 1145  cefTRIAXone (ROCEPHIN) 2 g in sodium chloride 0.9 % 100 mL IVPB        2 g 200 mL/hr over 30 Minutes Intravenous  Once 02/17/20 1141 02/17/20 1241   02/17/20 1145  azithromycin (ZITHROMAX) 500 mg in sodium chloride 0.9 % 250 mL IVPB        500 mg 250 mL/hr over 60 Minutes Intravenous  Once 02/17/20 1141 02/17/20 1408      Subjective:  Patient seen and examined at the bedside this morning.  Hemodynamically stable.  Complains of persistent shortness of breath, dry cough.  In sinus tachycardia.  Examination did not reveal significant wheezes.   Objective: Vitals:   02/19/20 0005 02/19/20 0411 02/19/20 0753 02/19/20 1118  BP: (!) 165/66 (!) 149/62 (!) 165/57 (!) 149/53  Pulse: 92 97 (!) 108 97  Resp: 16 16 18 18   Temp: 98.1 F (36.7 C) 98.3 F (36.8 C) 97.8 F (36.6 C) 97.6 F (36.4 C)  TempSrc: Oral Oral    SpO2: 100% 100% 100% 100%  Weight:      Height:        Intake/Output Summary (Last 24 hours) at 02/19/2020 1157 Last data filed at 02/19/2020 1011 Gross per 24 hour  Intake 360 ml  Output --  Net 360 ml   Filed Weights   02/17/20 0830 02/17/20 1755  Weight: 52.2 kg 52.7 kg    Examination:  General exam: Deconditioned female, appears older than age HEENT:PERRL,Oral mucosa moist, Ear/Nose normal on gross exam Respiratory system:  Bilateral diminished air sounds, no crackles or wheezes  cardiovascular system: Sinus tachycardia. No JVD, murmurs, rubs, gallops or clicks. No pedal edema. Gastrointestinal system: Abdomen is nondistended, soft and nontender. No organomegaly or masses felt. Normal bowel sounds heard. Central nervous system: Alert and oriented. No focal neurological deficits. Extremities: No edema, no clubbing ,no cyanosis Skin: No rashes, lesions or ulcers,no icterus ,no pallor  Data Reviewed: I have personally reviewed following labs and imaging studies  CBC: Recent Labs  Lab 02/17/20 0834 02/19/20 0419  WBC 14.0* 15.0*  NEUTROABS  --  11.8*  HGB 11.5* 9.5*  HCT 32.6* 28.0*  MCV 91.1 93.6  PLT 279 177   Basic Metabolic Panel: Recent Labs  Lab 02/17/20 0834 02/18/20 0825 02/19/20 0419  NA 125* 131* 131*  K 3.2* 4.3 4.6  CL 86* 95* 98  CO2 27 26 27   GLUCOSE 108* 133* 102*  BUN 13 10 13   CREATININE 0.84 0.63 0.62  CALCIUM 9.5 9.0 8.8*  MG  --  1.7  --    GFR: Estimated Creatinine Clearance: 47.9 mL/min (by C-G formula based on SCr of 0.62 mg/dL). Liver Function Tests: No results for input(s): AST, ALT, ALKPHOS, BILITOT, PROT, ALBUMIN in the last 168 hours. No results for input(s): LIPASE, AMYLASE in the last 168 hours. No results for input(s): AMMONIA in the last 168 hours. Coagulation Profile: No results for input(s): INR, PROTIME in the last 168 hours. Cardiac Enzymes: No results for input(s): CKTOTAL, CKMB, CKMBINDEX, TROPONINI in the last 168 hours. BNP (last 3 results) No results for input(s): PROBNP in the last 8760 hours. HbA1C: No results for input(s): HGBA1C in the last 72 hours. CBG: No results for input(s): GLUCAP in the last 168 hours. Lipid Profile: No results for input(s): CHOL, HDL, LDLCALC, TRIG, CHOLHDL, LDLDIRECT in the last 72 hours. Thyroid Function Tests: No results for input(s): TSH, T4TOTAL, FREET4, T3FREE, THYROIDAB in the last 72 hours. Anemia  Panel: No results for input(s): VITAMINB12, FOLATE, FERRITIN, TIBC, IRON, RETICCTPCT in the last 72 hours. Sepsis Labs: No results for input(s): PROCALCITON, LATICACIDVEN in the last 168 hours.  Recent Results (from the past 240 hour(s))  Novel Coronavirus, NAA ([facility])     Status: None   Collection Time: 02/14/20  3:03 PM   Specimen: Saline  Result Value Ref Range Status   SARS-CoV-2, NAA Not Detected Not Detected Final    Comment: This nucleic acid amplification test was developed and its performance characteristics determined by [employer]. Nucleic acid amplification tests include RT-PCR and TMA. This test has not been FDA cleared or approved. This test has been authorized by FDA under an Emergency Use Authorization (EUA). This test is only authorized for the duration of time the declaration that circumstances exist justifying the authorization of the emergency use of  in vitro diagnostic tests for detection of SARS-CoV-2 virus and/or diagnosis of COVID-19 infection under section 564(b)(1) of the Act, 21 U.S.C. 712WPY-0(D) (1), unless the authorization is terminated or revoked sooner. When diagnostic testing is negative, the possibility of a false negative result should be considered in the context of a patient's recent exposures and the presence of clinical signs and symptoms consistent with COVID-19. An individual without symptoms of COVID-19 and who is not shedding SARS-CoV-2 virus wo uld expect to have a negative (not detected) result in this assay.   SARS-COV-2, NAA 2 DAY TAT     Status: None   Collection Time: 02/14/20  3:03 PM  Result Value Ref Range Status   SARS-CoV-2, NAA 2 DAY TAT Performed  Final  Resp Panel by RT-PCR (Flu A&B, Covid) Nasopharyngeal Swab     Status: None   Collection Time: 02/17/20 10:22 AM   Specimen: Nasopharyngeal Swab; Nasopharyngeal(NP) swabs in vial transport medium  Result Value Ref Range Status   SARS Coronavirus 2 by RT PCR  NEGATIVE NEGATIVE Final    Comment: (NOTE) SARS-CoV-2 target nucleic acids are NOT DETECTED.  The SARS-CoV-2 RNA is generally detectable in upper respiratory specimens during the acute phase of infection. The lowest concentration of SARS-CoV-2 viral copies this assay can detect is 138 copies/mL. A negative result does not preclude SARS-Cov-2 infection and should not be used as the sole basis for treatment or other patient management decisions. A negative result may occur with  improper specimen collection/handling, submission of specimen other than nasopharyngeal swab, presence of viral mutation(s) within the areas targeted by this assay, and inadequate number of viral copies(<138 copies/mL). A negative result must be combined with clinical observations, patient history, and epidemiological information. The expected result is Negative.  Fact Sheet for Patients:  EntrepreneurPulse.com.au  Fact Sheet for Healthcare Providers:  IncredibleEmployment.be  This test is no t yet approved or cleared by the [place] FDA and  has been authorized for detection and/or diagnosis of SARS-CoV-2 by FDA under an Emergency Use Authorization (EUA). This EUA will remain  in effect (meaning this test can be used) for the duration of the COVID-19 declaration under Section 564(b)(1) of the Act, 21 U.S.C.section 360bbb-3(b)(1), unless the authorization is terminated  or revoked sooner.       Influenza A by PCR NEGATIVE NEGATIVE Final   Influenza B by PCR NEGATIVE NEGATIVE Final    Comment: (NOTE) The Xpert Xpress SARS-CoV-2/FLU/RSV plus assay is intended as an aid in the diagnosis of influenza from Nasopharyngeal swab specimens and should not be used as a sole basis for treatment. Nasal washings and aspirates are unacceptable for Xpert Xpress SARS-CoV-2/FLU/RSV testing.  Fact Sheet for Patients: EntrepreneurPulse.com.au  Fact Sheet for  Healthcare Providers: IncredibleEmployment.be  This test is not yet approved or cleared by the [place] FDA and has been authorized for detection and/or diagnosis of SARS-CoV-2 by FDA under an Emergency Use Authorization (EUA). This EUA will remain in effect (meaning this test can be used) for the duration of the COVID-19 declaration under Section 564(b)(1) of the Act, 21 U.S.C. section 360bbb-3(b)(1), unless the authorization is terminated or revoked.  Performed at [facility], [address] [place], [place] [address]          Radiology Studies: No results found.      Scheduled Meds: . aspirin EC  81 mg Oral Daily  . atorvastatin  40 mg Oral Daily  . budesonide (PULMICORT) nebulizer solution  0.25 mg Nebulization  BID  . calcium carbonate  1,250 mg Oral Daily  . cetirizine  10 mg Oral Daily  . clopidogrel  75 mg Oral Daily  . doxycycline  100 mg Oral Q12H  . enoxaparin (LOVENOX) injection  40 mg Subcutaneous Q24H  . feeding supplement  237 mL Oral BID BM  . fluticasone  2 spray Each Nare Daily  . guaiFENesin-codeine  5 mL Oral Q6H  . ipratropium-albuterol  3 mL Nebulization Q6H  . isosorbide mononitrate  30 mg Oral Daily  . losartan  50 mg Oral Daily  . methylPREDNISolone (SOLU-MEDROL) injection  40 mg Intravenous Q12H  . metoprolol succinate  50 mg Oral Daily  . mometasone-formoterol  2 puff Inhalation BID  . nicotine  21 mg Transdermal Daily  . nystatin  5 mL Oral QID  . pantoprazole  40 mg Oral Daily   Continuous Infusions:   LOS: 2 days    Time spent: 25 mins.More than 50% of that time was spent in counseling and/or coordination of care.      Shelly Coss, MD [facility] P12/15/2021, 11:57 AM

## 2020-02-19 NOTE — Progress Notes (Addendum)
Patient's O2 sat maintained at 100% on 2L/[place], Pt educated on the need to titrate down O2 supplementation but she refused. She stated that she experiences SOB on exertion esp when she changes position from her bed to the [facility] or when she ambulates to the BR. C/o of generalized pain and cough, albuterol treatment, tylenol and guaifenesin given, pt had good relief, currently sleeping. During med pass, pt educated to use incentive spirometry every hour while awake, she verbalized understanding.

## 2020-02-19 NOTE — TOC Progression Note (Signed)
Transition of Care [facility]) - Progression Note    Patient Details  Name: Kristi Clark MRN: [id number] Date of Birth: 1950/08/03  [facility] CM/SW [place], LCSW Phone Number: 02/19/2020, 2:44 PM  Clinical Narrative:   CSW informed by [employer] Representative Thedore Mins that patient may benefit from an [place] asked MD if patient needs an Afflov[employer].    Expected Discharge Plan: [place] Barriers to Discharge: Continued Medical Work up  Expected Discharge Plan and Services Expected Discharge Plan: [place]       Living arrangements for the past 2 months: Single Family Home                 DME Arranged: Oxygen DME Agency: [employer] Date DME Agency Contacted: 02/18/20   Representative spoke with at DME Agency: Thedore Mins HH Arranged: PT,OT,RN           Social Determinants of Health (SDOH) Interventions    Readmission Risk Interventions No flowsheet data found.

## 2020-02-19 NOTE — Plan of Care (Signed)
  Problem: Education: Goal: Knowledge of General Education information will improve Description: Including pain rating scale, medication(s)/side effects and non-pharmacologic comfort measures Outcome: Progressing   Problem: Clinical Measurements: Goal: Cardiovascular complication will be avoided Outcome: Progressing   Problem: Coping: Goal: Level of anxiety will decrease Outcome: Progressing   Problem: Elimination: Goal: Will not experience complications related to bowel motility Outcome: Progressing Goal: Will not experience complications related to urinary retention Outcome: Progressing   Problem: Pain Managment: Goal: General experience of comfort will improve Outcome: Progressing   Problem: Safety: Goal: Ability to remain free from injury will improve Outcome: Progressing   Problem: Skin Integrity: Goal: Risk for impaired skin integrity will decrease Outcome: Progressing

## 2020-02-19 NOTE — Progress Notes (Signed)
SATURATION QUALIFICATIONS: (This note is used to comply with regulatory documentation for home oxygen)  Patient Saturations on Room Air at Rest = 97%  Patient Saturations on Room Air while Ambulating = 95*%  Patient Saturations on  Liters of oxygen while Ambulating = 99%  Please briefly explain why patient needs home oxygen:  Patient does not seem to need oxygen upon assessment.

## 2020-02-20 ENCOUNTER — Encounter: Payer: Self-pay | Admitting: Family Medicine

## 2020-02-20 LAB — BASIC METABOLIC PANEL
Anion gap: 7 (ref 5–15)
BUN: 15 mg/dL (ref 8–23)
CO2: 29 mmol/L (ref 22–32)
Calcium: 9.3 mg/dL (ref 8.9–10.3)
Chloride: 98 mmol/L (ref 98–111)
Creatinine, Ser: 0.62 mg/dL (ref 0.44–1.00)
GFR, Estimated: >60 mL/min (ref >60)
Glucose, Bld: 169 mg/dL — ABNORMAL HIGH (ref 70–99)
Potassium: 4.8 mmol/L (ref 3.5–5.1)
Sodium: 134 mmol/L — ABNORMAL LOW (ref 135–145)

## 2020-02-20 LAB — CBC WITH DIFFERENTIAL/PLATELET
Abs Immature Granulocytes: 0.08 10*3/uL — ABNORMAL HIGH (ref 0.00–0.07)
Basophils Absolute: 0 10*3/uL (ref 0.0–0.1)
Basophils Relative: 0 %
Eosinophils Absolute: 0 10*3/uL (ref 0.0–0.5)
Eosinophils Relative: 0 %
HCT: 28.6 % — ABNORMAL LOW (ref 36.0–46.0)
Hemoglobin: 9.7 g/dL — ABNORMAL LOW (ref 12.0–15.0)
Immature Granulocytes: 1 %
Lymphocytes Relative: 16 %
Lymphs Abs: 1.2 10*3/uL (ref 0.7–4.0)
MCH: 31.9 pg (ref 26.0–34.0)
MCHC: 33.9 g/dL (ref 30.0–36.0)
MCV: 94.1 fL (ref 80.0–100.0)
Monocytes Absolute: 0.1 10*3/uL (ref 0.1–1.0)
Monocytes Relative: 2 %
Neutro Abs: 5.8 10*3/uL (ref 1.7–7.7)
Neutrophils Relative %: 81 %
Platelets: 246 10*3/uL (ref 150–400)
RBC: 3.04 MIL/uL — ABNORMAL LOW (ref 3.87–5.11)
RDW: 13.3 % (ref 11.5–15.5)
Smear Review: NORMAL
WBC: 7.2 10*3/uL (ref 4.0–10.5)
nRBC: 0 % (ref 0.0–0.2)

## 2020-02-20 MED ORDER — DOXYCYCLINE HYCLATE 100 MG PO TABS: 100.0000 mg | ORAL_TABLET | Freq: Two times a day (BID) | ORAL | 0 refills | Status: AC

## 2020-02-20 MED ORDER — GUAIFENESIN-CODEINE 100-10 MG/5ML PO SOLN
5.0000 mL | Freq: Four times a day (QID) | ORAL | 0 refills | Status: DC
Start: 2020-02-20 — End: 2020-02-25

## 2020-02-20 MED ORDER — NYSTATIN 100000 UNIT/ML MT SUSP: 5.0000 mL | Freq: Four times a day (QID) | OROMUCOSAL | 3 refills | Status: AC

## 2020-02-20 MED ORDER — PREDNISONE 20 MG PO TABS: 40.0000 mg | ORAL_TABLET | Freq: Every day | ORAL | 0 refills | Status: AC

## 2020-02-20 NOTE — Progress Notes (Signed)
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  The patient has tried other standard treatments such as chest physiotherapy (CPT). However, the patient does not have a caregiver that is available to provide adequate CPT for 30 minutes, twice a day, 365 days a year.  The patient has also tried and failed other treatments such as flutter valve.  Patient would benefit from HFCWO (High Frequency Chest Wall Oscillation) Vest Therapy.

## 2020-02-20 NOTE — Care Management Important Message (Signed)
Important Message  Patient Details  Name: Kristi Clark MRN: [id number] Date of Birth: 1950/08/29   Medicare Important Message Given:  N/A - LOS <3 / Initial given by admissions     Juliann Pulse A Madicyn Mesina 02/20/2020, 10:55 AM

## 2020-02-20 NOTE — Discharge Summary (Addendum)
Physician Discharge Summary  Kristi Clark ZHG:[id number] DOB: [birth year] DOA: 02/17/2020  PCP: [name], DO  Admit date: 02/17/2020 Discharge date: 02/21/2020  Admitted From: Home Disposition:  Home  Discharge Condition:Stable CODE STATUS: DNR Diet recommendation: Heart Healthy   Brief/Interim Summary:  Patient is a 59-year female with history of COPD, depression, GERD, hypertension, coronary artery disease who presented with complaints of cough, congestion, wheezing, shortness of breath and fever.  Chest x-ray was suggestive of acute bronchitis.  She was admitted for the management of acute COPD exacerbation/bronchitis and was started on IV steroids.  Hospital course remarkable for persistent cough and shortness of breath.  Today she feels much better.  She qualified for home oxygen.  She is medically stable for discharge home today.  Following problems were addressed during her hospitalization:  Acute on chronic hypoxic respiratory failure: Secondary to COPD exacerbation.  Continue supplemental oxygen.  She is not on oxygen at home. She qualified for home oxygen  COPD exacerbation: Smoker.  Not using oxygen at home.  Uses inhalers.  Follows with pulmonology.  Also started on doxycycline on admission.  We will discharge her on 5 days course of prednisone.  Patient has been strongly advised to quit smoking.  Hyponatremia/hypokalemia: Improved  Coronary artery disease: Takes aspirin, Plavix, Lipitor at home which we will continue  Hypertension: Currently well ,stable.  Continue current medications  Tobacco use: Counseled for cessation.  Continue nicotine patch  Leukocytosis: Most likely secondary to steroids.    Resolved  Normocytic anemia: Hemoglobin dropped from 11.5 to 9.5 most likely from hemodilution.    Monitor as an outpatient  Debility/deconditioning: PT recommended home health.   Discharge Diagnoses:  Principal Problem:   COPD with acute  exacerbation ([place]) Active Problems:   CAD (coronary artery disease)   Hyponatremia   Hypokalemia   Respiratory failure, acute (HCC)   Nicotine dependence    Discharge Instructions  Discharge Instructions    Diet - low sodium heart healthy   Complete by: As directed    Discharge instructions   Complete by: As directed    1)Please take prescribed medications as instructed 2)Follow up with your PCP and pulmonologist as an outpatient 3)Please stop smoking   Face-to-face encounter (required for Medicare/Medicaid patients)   Complete by: As directed    I BERNARD AYIKU certify that this patient is under my care and that I, or a nurse practitioner or physician's assistant working with me, had a face-to-face encounter that meets the physician face-to-face encounter requirements with this patient on 02/18/2020. The encounter with the patient was in whole, or in part for the following medical condition(s) which is the primary reason for home health care (List medical condition): Debility, COPD   The encounter with the patient was in whole, or in part, for the following medical condition, which is the primary reason for home health care: Debility, [place]   I certify that, based on my findings, the following services are medically necessary home health services:  Nursing Physical therapy     Reason for Medically Necessary Home Health Services:  Skilled Nursing- Change/Decline in Patient Status Therapy- Personnel officer, Training and development officer and Stair Training     My clinical findings support the need for the above services: Shortness of breath with activity   Further, I certify that my clinical findings support that this patient is homebound due to: Shortness of Breath with activity   Home Health   Complete by: As directed    To provide  the following care/treatments:  PT RN     Increase activity slowly   Complete by: As directed      Allergies as of 02/20/2020      Reactions    Clindamycin/lincomycin Hives   Amoxicillin Rash   Avelox [moxifloxacin Hcl In Nacl] Rash   Codeine Sulfate Nausea Only   Penicillins Rash   Sulfa Antibiotics Rash      Medication List    TAKE these medications   albuterol 108 (90 Base) MCG/ACT inhaler Commonly known as: VENTOLIN HFA Inhale 2 puffs into the lungs every 6 (six) hours as needed for wheezing or shortness of breath.   aspirin 81 MG tablet Take 81 mg by mouth daily.   atorvastatin 40 MG tablet Commonly known as: LIPITOR Take 1 tablet (40 mg total) by mouth daily.   CALCIUM 600 PO Take by mouth daily.   CENTRUM SILVER ADULT 50+ PO Take 1 tablet by mouth daily.   clopidogrel 75 MG tablet Commonly known as: PLAVIX Take 1 tablet (75 mg total) by mouth daily.   doxycycline 100 MG tablet Commonly known as: VIBRA-TABS Take 1 tablet (100 mg total) by mouth every 12 (twelve) hours for 2 days.   fluticasone 50 MCG/ACT nasal spray Commonly known as: FLONASE Place 2 sprays into both nostrils daily.   Fluticasone-Salmeterol 250-50 MCG/DOSE Aepb Commonly known as: ADVAIR Inhale 1 puff into the lungs 2 (two) times daily.   guaiFENesin-codeine 100-10 MG/5ML syrup Take 5 mLs by mouth every 6 (six) hours.   hydrochlorothiazide 25 MG tablet Commonly known as: HYDRODIURIL Take 1 tablet (25 mg total) by mouth daily.   isosorbide mononitrate 30 MG 24 hr tablet Commonly known as: IMDUR Take 1 tablet (30 mg total) by mouth daily.   losartan 50 MG tablet Commonly known as: COZAAR Take 1 tablet (50 mg total) by mouth daily.   metoprolol succinate 50 MG 24 hr tablet Commonly known as: TOPROL-XL Take 1 tablet (50 mg total) by mouth daily. Take with or immediately following a meal.   nortriptyline 10 MG capsule Commonly known as: Pamelor Take 1 capsule (10 mg total) by mouth at bedtime.   nystatin 100000 UNIT/ML suspension Commonly known as: MYCOSTATIN Take 5 mLs (500,000 Units total) by mouth 4 (four) times daily  for 7 days.   ondansetron 4 MG disintegrating tablet Commonly known as: Zofran ODT Take 1 tablet (4 mg total) by mouth every 8 (eight) hours as needed for nausea or vomiting.   pantoprazole 40 MG tablet Commonly known as: PROTONIX Take 1 tablet (40 mg total) by mouth daily.   predniSONE 20 MG tablet Commonly known as: Deltasone Take 2 tablets (40 mg total) by mouth daily for 5 days. What changed:   medication strength  how much to take  when to take this   PROBIOTIC-10 PO Take by mouth. Every other day   tiotropium 18 MCG inhalation capsule Commonly known as: SPIRIVA Place 1 capsule (18 mcg total) into inhaler and inhale daily.   ZYRTEC PO Take by mouth daily.       Follow-up Information    Park Liter P, DO. Schedule an appointment as soon as possible for a visit in 1 week(s).   Specialty: Family Medicine Contact information: [place] [address] [phone]              Allergies  Allergen Reactions  . Clindamycin/Lincomycin Hives  . Amoxicillin Rash  . Avelox [Moxifloxacin Hcl In Nacl] Rash  .  Codeine Sulfate Nausea Only  . Penicillins Rash  . Sulfa Antibiotics Rash    Consultations:  None   Procedures/Studies: DG Chest 2 View  Result Date: 02/17/2020 CLINICAL DATA:  Cough and shortness of breath for several days EXAM: CHEST - 2 VIEW COMPARISON:  07/01/2019 FINDINGS: Cardiac shadow is stable. Aortic calcifications are again noted. Increase in chronic scarring is noted bilaterally. Some increased bronchitic markings are noted centrally likely related to underlying bronchitis. No focal confluent infiltrate is seen. No effusion or pneumothorax is noted. Degenerative changes of the thoracic spine are seen. IMPRESSION: Increased bronchitic markings bilaterally consistent with acute bronchitis superimposed over more chronic fibrotic changes. Electronically Signed   By: Inez Catalina M.D.   On: 02/17/2020 10:09      Subjective: Patient  seen and examined at the bedside this morning.  Hemodynamically stable for discharge today.  Discharge Exam: Vitals:   02/20/20 0843 02/20/20 1139  BP: (!) 159/59 (!) 153/60  Pulse: (!) 107 85  Resp: 20 16  Temp: 97.8 F (36.6 C) 98.5 F (36.9 C)  SpO2: 97% 100%   Vitals:   02/20/20 0359 02/20/20 0415 02/20/20 0843 02/20/20 1139  BP: (!) 155/74 (!) 151/70 (!) 159/59 (!) 153/60  Pulse: (!) 113 78 (!) 107 85  Resp: 18 20 20 16   Temp: 98 F (36.7 C) 97.9 F (36.6 C) 97.8 F (36.6 C) 98.5 F (36.9 C)  TempSrc:   Oral Oral  SpO2: (!) 88% 98% 97% 100%  Weight:      Height:        General: Pt is alert, awake, not in acute distress Cardiovascular: RRR, S1/S2 +, no rubs, no gallops Respiratory:  no wheezing, no rhonchi, diminished air sounds bilaterally Abdominal: Soft, NT, ND, bowel sounds + Extremities: no edema, no cyanosis    The results of significant diagnostics from this hospitalization (including imaging, microbiology, ancillary and laboratory) are listed below for reference.     Microbiology: Recent Results (from the past 240 hour(s))  Novel Coronavirus, NAA ([facility])     Status: None   Collection Time: 02/14/20  3:03 PM   Specimen: Saline  Result Value Ref Range Status   SARS-CoV-2, NAA Not Detected Not Detected Final    Comment: This nucleic acid amplification test was developed and its performance characteristics determined by [employer], [employer] and [employer]. Nucleic acid amplification tests include RT-PCR and TMA. This test has not been FDA cleared or approved. This test has been authorized by FDA under an Emergency Use Authorization (EUA). This test is only authorized for the duration of time the declaration that circumstances exist justifying the authorization of the emergency use of in vitro diagnostic tests for detection of SARS-CoV-2 virus and/or diagnosis of COVID-19 infection under section 564(b)(1) of the Act, 21 U.S.C. 314HFW-2(O) (1), unless the  authorization is terminated or revoked sooner. When diagnostic testing is negative, the possibility of a false negative result should be considered in the context of a patient's recent exposures and the presence of clinical signs and symptoms consistent with COVID-19. An individual without symptoms of COVID-19 and who is not shedding SARS-CoV-2 virus wo uld expect to have a negative (not detected) result in this assay.   SARS-COV-2, NAA 2 DAY TAT     Status: None   Collection Time: 02/14/20  3:03 PM  Result Value Ref Range Status   SARS-CoV-2, NAA 2 DAY TAT Performed  Final  Resp Panel by RT-PCR (Flu A&B, Covid) Nasopharyngeal Swab     Status: None  Collection Time: 02/17/20 10:22 AM   Specimen: Nasopharyngeal Swab; Nasopharyngeal(NP) swabs in vial transport medium  Result Value Ref Range Status   SARS Coronavirus 2 by RT PCR NEGATIVE NEGATIVE Final    Comment: (NOTE) SARS-CoV-2 target nucleic acids are NOT DETECTED.  The SARS-CoV-2 RNA is generally detectable in upper respiratory specimens during the acute phase of infection. The lowest concentration of SARS-CoV-2 viral copies this assay can detect is 138 copies/mL. A negative result does not preclude SARS-Cov-2 infection and should not be used as the sole basis for treatment or other patient management decisions. A negative result may occur with  improper specimen collection/handling, submission of specimen other than nasopharyngeal swab, presence of viral mutation(s) within the areas targeted by this assay, and inadequate number of viral copies(<138 copies/mL). A negative result must be combined with clinical observations, patient history, and epidemiological information. The expected result is Negative.  Fact Sheet for Patients:  EntrepreneurPulse.com.au  Fact Sheet for Healthcare Providers:  IncredibleEmployment.be  This test is no t yet approved or cleared by the [place] FDA and   has been authorized for detection and/or diagnosis of SARS-CoV-2 by FDA under an Emergency Use Authorization (EUA). This EUA will remain  in effect (meaning this test can be used) for the duration of the COVID-19 declaration under Section 564(b)(1) of the Act, 21 U.S.C.section 360bbb-3(b)(1), unless the authorization is terminated  or revoked sooner.       Influenza A by PCR NEGATIVE NEGATIVE Final   Influenza B by PCR NEGATIVE NEGATIVE Final    Comment: (NOTE) The Xpert Xpress SARS-CoV-2/FLU/RSV plus assay is intended as an aid in the diagnosis of influenza from Nasopharyngeal swab specimens and should not be used as a sole basis for treatment. Nasal washings and aspirates are unacceptable for Xpert Xpress SARS-CoV-2/FLU/RSV testing.  Fact Sheet for Patients: EntrepreneurPulse.com.au  Fact Sheet for Healthcare Providers: IncredibleEmployment.be  This test is not yet approved or cleared by the [place] FDA and has been authorized for detection and/or diagnosis of SARS-CoV-2 by FDA under an Emergency Use Authorization (EUA). This EUA will remain in effect (meaning this test can be used) for the duration of the COVID-19 declaration under Section 564(b)(1) of the Act, 21 U.S.C. section 360bbb-3(b)(1), unless the authorization is terminated or revoked.  Performed at [facility], [address] [place], [place] [address]      Labs: BNP (last 3 results) No results for input(s): BNP in the last 8760 hours. Basic Metabolic Panel: Recent Labs  Lab 02/17/20 0834 02/18/20 0825 02/19/20 0419 02/20/20 0526  NA 125* 131* 131* 134*  K 3.2* 4.3 4.6 4.8  CL 86* 95* 98 98  CO2 27 26 27 29   GLUCOSE 108* 133* 102* 169*  BUN 13 10 13 15   CREATININE 0.84 0.63 0.62 0.62  CALCIUM 9.5 9.0 8.8* 9.3  MG  --  1.7  --   --    Liver Function Tests: No results for input(s): AST, ALT, ALKPHOS, BILITOT, PROT, ALBUMIN in the last 168  hours. No results for input(s): LIPASE, AMYLASE in the last 168 hours. No results for input(s): AMMONIA in the last 168 hours. CBC: Recent Labs  Lab 02/17/20 0834 02/19/20 0419 02/20/20 0526  WBC 14.0* 15.0* 7.2  NEUTROABS  --  11.8* 5.8  HGB 11.5* 9.5* 9.7*  HCT 32.6* 28.0* 28.6*  MCV 91.1 93.6 94.1  PLT 279 260 246   Cardiac Enzymes: No results for input(s): CKTOTAL, CKMB, CKMBINDEX, TROPONINI in the last  168 hours. BNP: Invalid input(s): POCBNP CBG: No results for input(s): GLUCAP in the last 168 hours. D-Dimer No results for input(s): DDIMER in the last 72 hours. Hgb A1c No results for input(s): HGBA1C in the last 72 hours. Lipid Profile No results for input(s): CHOL, HDL, LDLCALC, TRIG, CHOLHDL, LDLDIRECT in the last 72 hours. Thyroid function studies No results for input(s): TSH, T4TOTAL, T3FREE, THYROIDAB in the last 72 hours.  Invalid input(s): FREET3 Anemia work up No results for input(s): VITAMINB12, FOLATE, FERRITIN, TIBC, IRON, RETICCTPCT in the last 72 hours. Urinalysis    Component Value Date/Time   COLORURINE STRAW (A) 02/17/2020 1022   APPEARANCEUR CLEAR (A) 02/17/2020 1022   APPEARANCEUR Clear 11/18/2019 1347   LABSPEC 1.004 (L) 02/17/2020 1022   PHURINE 6.0 02/17/2020 1022   GLUCOSEU NEGATIVE 02/17/2020 1022   HGBUR NEGATIVE 02/17/2020 1022   BILIRUBINUR NEGATIVE 02/17/2020 1022   BILIRUBINUR Negative 11/18/2019 [place] 02/17/2020 1022   PROTEINUR NEGATIVE 02/17/2020 1022   NITRITE NEGATIVE 02/17/2020 1022   LEUKOCYTESUR NEGATIVE 02/17/2020 1022   Sepsis Labs Invalid input(s): PROCALCITONIN,  WBC,  LACTICIDVEN Microbiology Recent Results (from the past 240 hour(s))  Novel Coronavirus, NAA ([facility])     Status: None   Collection Time: 02/14/20  3:03 PM   Specimen: Saline  Result Value Ref Range Status   SARS-CoV-2, NAA Not Detected Not Detected Final    Comment: This nucleic acid amplification test was developed and its  performance characteristics determined by [employer], [employer] and Company. Nucleic acid amplification tests include RT-PCR and TMA. This test has not been FDA cleared or approved. This test has been authorized by FDA under an Emergency Use Authorization (EUA). This test is only authorized for the duration of time the declaration that circumstances exist justifying the authorization of the emergency use of in vitro diagnostic tests for detection of SARS-CoV-2 virus and/or diagnosis of COVID-19 infection under section 564(b)(1) of the Act, 21 U.S.C. 785YIF-0(Y) (1), unless the authorization is terminated or revoked sooner. When diagnostic testing is negative, the possibility of a false negative result should be considered in the context of a patient's recent exposures and the presence of clinical signs and symptoms consistent with COVID-19. An individual without symptoms of COVID-19 and who is not shedding SARS-CoV-2 virus wo uld expect to have a negative (not detected) result in this assay.   SARS-COV-2, NAA 2 DAY TAT     Status: None   Collection Time: 02/14/20  3:03 PM  Result Value Ref Range Status   SARS-CoV-2, NAA 2 DAY TAT Performed  Final  Resp Panel by RT-PCR (Flu A&B, Covid) Nasopharyngeal Swab     Status: None   Collection Time: 02/17/20 10:22 AM   Specimen: Nasopharyngeal Swab; Nasopharyngeal(NP) swabs in vial transport medium  Result Value Ref Range Status   SARS Coronavirus 2 by RT PCR NEGATIVE NEGATIVE Final    Comment: (NOTE) SARS-CoV-2 target nucleic acids are NOT DETECTED.  The SARS-CoV-2 RNA is generally detectable in upper respiratory specimens during the acute phase of infection. The lowest concentration of SARS-CoV-2 viral copies this assay can detect is 138 copies/mL. A negative result does not preclude SARS-Cov-2 infection and should not be used as the sole basis for treatment or other patient management decisions. A negative result may occur with  improper specimen  collection/handling, submission of specimen other than nasopharyngeal swab, presence of viral mutation(s) within the areas targeted by this assay, and inadequate number of viral copies(<138 copies/mL). A negative result must  be combined with clinical observations, patient history, and epidemiological information. The expected result is Negative.  Fact Sheet for Patients:  EntrepreneurPulse.com.au  Fact Sheet for Healthcare Providers:  IncredibleEmployment.be  This test is no t yet approved or cleared by the [place] FDA and  has been authorized for detection and/or diagnosis of SARS-CoV-2 by FDA under an Emergency Use Authorization (EUA). This EUA will remain  in effect (meaning this test can be used) for the duration of the COVID-19 declaration under Section 564(b)(1) of the Act, 21 U.S.C.section 360bbb-3(b)(1), unless the authorization is terminated  or revoked sooner.       Influenza A by PCR NEGATIVE NEGATIVE Final   Influenza B by PCR NEGATIVE NEGATIVE Final    Comment: (NOTE) The Xpert Xpress SARS-CoV-2/FLU/RSV plus assay is intended as an aid in the diagnosis of influenza from Nasopharyngeal swab specimens and should not be used as a sole basis for treatment. Nasal washings and aspirates are unacceptable for Xpert Xpress SARS-CoV-2/FLU/RSV testing.  Fact Sheet for Patients: EntrepreneurPulse.com.au  Fact Sheet for Healthcare Providers: IncredibleEmployment.be  This test is not yet approved or cleared by the [place] FDA and has been authorized for detection and/or diagnosis of SARS-CoV-2 by FDA under an Emergency Use Authorization (EUA). This EUA will remain in effect (meaning this test can be used) for the duration of the COVID-19 declaration under Section 564(b)(1) of the Act, 21 U.S.C. section 360bbb-3(b)(1), unless the authorization is terminated or revoked.  Performed at [facility], [address] [place], [place] [address]     Please note: You were cared for by a hospitalist during your hospital stay. Once you are discharged, your primary care physician will handle any further medical issues. Please note that NO REFILLS for any discharge medications will be authorized once you are discharged, as it is imperative that you return to your primary care physician (or establish a relationship with a primary care physician if you do not have one) for your post hospital discharge needs so that they can reassess your need for medications and monitor your lab values.    Time coordinating discharge: 40 minutes  SIGNED:   Shelly Coss, MD  [facility] 02/21/2020, 2:42 PM Pager [phone]  If 7PM-7AM, please contact night-coverage [email or web address] Password [email or web address]

## 2020-02-20 NOTE — Progress Notes (Signed)
SATURATION QUALIFICATIONS: (This note is used to comply with regulatory documentation for home oxygen)  Patient Saturations on Room Air at Rest = 93%  Patient Saturations on Room Air while Ambulating = 85%  Patient Saturations on 2 Liters of oxygen while Ambulating = 95%  Please briefly explain why patient needs home oxygen:  Patient does ok with RA while resting, but while ambulating and doing ADL's she would benefit with oxygen, because she desats to 85%

## 2020-02-20 NOTE — TOC Transition Note (Signed)
Transition of Care [facility] 2) - CM/SW Discharge Note   Patient Details  Name: Kristi Clark MRN: [id number] Date of Birth: 01-Apr-1950  Transition of [facility]) CM/SW Contact:  Magnus Ivan, LCSW Phone Number: 02/20/2020, 10:53 AM   Clinical Narrative:   Patient has orders to discharge home today. Home Health RN, PT, OT services arranged. CSW informed Corene Cornea with [facility] of discharge. [employer] Representative Zach setting up home oxygen and afflovest for patient, informed him of discharge as well. Thedore Mins reported patient can still get oxygen even if she does not have qualifying sats, just need note from MD. Asked MD and RN for note or for new qualifying sats. No additional needs identified prior to discharge.    Final next level of care: [place] Barriers to Discharge: Barriers Resolved   Patient Goals and CMS Choice Patient states their goals for this hospitalization and ongoing recovery are:: home with home health CMS Medicare.gov Compare Post Acute Care list provided to:: Patient Choice offered to / list presented to : Patient  Discharge Placement                  Name of family member notified: patient aware. Patient and family notified of of transfer: 02/20/20  Discharge Plan and Services                DME Arranged: Oxygen,Other see comment Cathie Hoops) DME Agency: [facility] Date DME Agency Contacted: 02/20/20   Representative spoke with at DME Agency: [place]: PT,OT,RN [place]: [place] ([place]) Date [place]: 02/20/20   Representative spoke with at [place]: Floydene Flock  Social Determinants of Health (SDOH) Interventions     Readmission Risk Interventions No flowsheet data found.

## 2020-02-20 NOTE — Progress Notes (Signed)
Physical Therapy Treatment Patient Details Name: Kristi Clark MRN: [id number] DOB: 1950/08/19 Today's Date: 02/20/2020    History of Present Illness Pt is a 69 y.o. female with medical history significant for COPD, depression, GERD, hypertension, CKD III, and osteopenia who presented to the emergency room for evaluation of shortness of breath.  MD assessment inlcudes: COPD with acute exacerbation, acute respiratory failure, hyponatremia, and hypokalemia.    PT Comments    Pt was pleasant and motivated to participate during the session.  Pt found on 2LO2/min with SpO2 99% and HR 110 bpm.  Per nursing ok for RA trial with activity/gait.  Pt performed graded therex per below on room air with SpO2 97% and HR 115 bpm.  Pt then ambulated 30 feet with a RW and good stability on room air with SpO2 85% and HR 125 bpm.  Pt returned to 2LO2/min with SpO2 returning quickly to the mid 90s.  Pt then ambulated 30 feet followed by 125 feet on 2LO2/min with SpO2 remaining in the mid to upper 90s and with HR in the 110's.  Pt subjectively reported feeling less SOB with amb on room air.  Pt will benefit from HHPT services upon discharge to safely address deficits listed in patient problem list for decreased risk of further functional decline and eventual return to PLOF.     Follow Up Recommendations  Home health PT;Supervision - Intermittent     Equipment Recommendations  None recommended by PT    Recommendations for Other Services       Precautions / Restrictions Precautions Precautions: Fall Precaution Comments: Low Fall Restrictions Other Position/Activity Restrictions: HOB >/= 30 deg, SpO2 >/= 92%    Mobility  Bed Mobility Overal bed mobility: Independent                Transfers Overall transfer level: Needs assistance Equipment used: Rolling walker (2 wheeled) Transfers: Sit to/from Stand Sit to Stand: Modified independent (Device/Increase time)         General transfer  comment: Good eccentric and concentric control and stability  Ambulation/Gait Ambulation/Gait assistance: Supervision Gait Distance (Feet): 125 Feet x 1, 30 Feet x 2 Assistive device: Rolling walker (2 wheeled) Gait Pattern/deviations: Step-through pattern;Decreased step length - right;Decreased step length - left Gait velocity: decreased   General Gait Details: Slow cadence but steady with amb without LOB including during sharp turns and navigation in tight spaces; pt self-selected to use a RW vs no AD   Stairs             Wheelchair Mobility    Modified Rankin (Stroke Patients Only)       Balance Overall balance assessment: Needs assistance   Sitting balance-Leahy Scale: Normal     Standing balance support: During functional activity;Bilateral upper extremity supported Standing balance-Leahy Scale: Good                              Cognition Arousal/Alertness: Awake/alert Behavior During Therapy: WFL for tasks assessed/performed Overall Cognitive Status: Within Functional Limits for tasks assessed                                        Exercises Total Joint Exercises Ankle Circles/Pumps: AROM;Strengthening;Both;10 reps Quad Sets: Strengthening;Both;10 reps Gluteal Sets: Strengthening;Both;10 reps Hip ABduction/ADduction: Strengthening;Both;10 reps Long Arc Quad: Strengthening;Both;10 reps Knee Flexion: Strengthening;Both;10 reps  General Comments        Pertinent Vitals/Pain Pain Assessment: No/denies pain    Home Living                      Prior Function            PT Goals (current goals can now be found in the care plan section) Progress towards PT goals: Progressing toward goals    Frequency    Min 2X/week      PT Plan Current plan remains appropriate    Co-evaluation              AM-PAC PT "6 Clicks" Mobility   Outcome Measure  Help needed turning from your back to your side while  in a flat bed without using bedrails?: None Help needed moving from lying on your back to sitting on the side of a flat bed without using bedrails?: None Help needed moving to and from a bed to a chair (including a wheelchair)?: None Help needed standing up from a chair using your arms (e.g., wheelchair or bedside chair)?: None Help needed to walk in hospital room?: A Little Help needed climbing 3-5 steps with a railing? : A Little 6 Click Score: 22    End of Session Equipment Utilized During Treatment: Oxygen;Gait belt Activity Tolerance: Patient tolerated treatment well Patient left: in chair;with call bell/phone within reach Nurse Communication: Mobility status;Other (comment) (SpO2 results with activity on room air) PT Visit Diagnosis: Muscle weakness (generalized) (M62.81);Difficulty in walking, not elsewhere classified (R26.2)     Time: 0932-1000 PT Time Calculation (min) (ACUTE ONLY): 28 min  Charges:  $Gait Training: 8-22 mins $Therapeutic Exercise: 8-22 mins                    D. Scott Roberto Hlavaty PT, DPT 02/20/20, 11:30 AM

## 2020-02-21 ENCOUNTER — Telehealth: Payer: Self-pay | Admitting: Family Medicine

## 2020-02-21 ENCOUNTER — Telehealth: Payer: Self-pay

## 2020-02-21 DIAGNOSIS — E785 Hyperlipidemia, unspecified: Secondary | ICD-10-CM | POA: Diagnosis not present

## 2020-02-21 DIAGNOSIS — M8580 Other specified disorders of bone density and structure, unspecified site: Secondary | ICD-10-CM | POA: Diagnosis not present

## 2020-02-21 DIAGNOSIS — F32A Depression, unspecified: Secondary | ICD-10-CM | POA: Diagnosis not present

## 2020-02-21 DIAGNOSIS — Z90710 Acquired absence of both cervix and uterus: Secondary | ICD-10-CM | POA: Diagnosis not present

## 2020-02-21 DIAGNOSIS — J9621 Acute and chronic respiratory failure with hypoxia: Secondary | ICD-10-CM | POA: Diagnosis not present

## 2020-02-21 DIAGNOSIS — J449 Chronic obstructive pulmonary disease, unspecified: Secondary | ICD-10-CM | POA: Diagnosis not present

## 2020-02-21 DIAGNOSIS — Z7982 Long term (current) use of aspirin: Secondary | ICD-10-CM | POA: Diagnosis not present

## 2020-02-21 DIAGNOSIS — E871 Hypo-osmolality and hyponatremia: Secondary | ICD-10-CM | POA: Diagnosis not present

## 2020-02-21 DIAGNOSIS — K219 Gastro-esophageal reflux disease without esophagitis: Secondary | ICD-10-CM | POA: Diagnosis not present

## 2020-02-21 DIAGNOSIS — Z9181 History of falling: Secondary | ICD-10-CM | POA: Diagnosis not present

## 2020-02-21 DIAGNOSIS — J441 Chronic obstructive pulmonary disease with (acute) exacerbation: Secondary | ICD-10-CM | POA: Diagnosis not present

## 2020-02-21 DIAGNOSIS — Z7952 Long term (current) use of systemic steroids: Secondary | ICD-10-CM | POA: Diagnosis not present

## 2020-02-21 DIAGNOSIS — I129 Hypertensive chronic kidney disease with stage 1 through stage 4 chronic kidney disease, or unspecified chronic kidney disease: Secondary | ICD-10-CM | POA: Diagnosis not present

## 2020-02-21 DIAGNOSIS — Z7902 Long term (current) use of antithrombotics/antiplatelets: Secondary | ICD-10-CM | POA: Diagnosis not present

## 2020-02-21 DIAGNOSIS — J209 Acute bronchitis, unspecified: Secondary | ICD-10-CM | POA: Diagnosis not present

## 2020-02-21 DIAGNOSIS — J440 Chronic obstructive pulmonary disease with acute lower respiratory infection: Secondary | ICD-10-CM | POA: Diagnosis not present

## 2020-02-21 DIAGNOSIS — Z9981 Dependence on supplemental oxygen: Secondary | ICD-10-CM | POA: Diagnosis not present

## 2020-02-21 DIAGNOSIS — N189 Chronic kidney disease, unspecified: Secondary | ICD-10-CM | POA: Diagnosis not present

## 2020-02-21 DIAGNOSIS — E876 Hypokalemia: Secondary | ICD-10-CM | POA: Diagnosis not present

## 2020-02-21 DIAGNOSIS — Z9049 Acquired absence of other specified parts of digestive tract: Secondary | ICD-10-CM | POA: Diagnosis not present

## 2020-02-21 DIAGNOSIS — I2510 Atherosclerotic heart disease of native coronary artery without angina pectoris: Secondary | ICD-10-CM | POA: Diagnosis not present

## 2020-02-21 DIAGNOSIS — D631 Anemia in chronic kidney disease: Secondary | ICD-10-CM | POA: Diagnosis not present

## 2020-02-21 DIAGNOSIS — F17210 Nicotine dependence, cigarettes, uncomplicated: Secondary | ICD-10-CM | POA: Diagnosis not present

## 2020-02-21 DIAGNOSIS — Z79899 Other long term (current) drug therapy: Secondary | ICD-10-CM | POA: Diagnosis not present

## 2020-02-21 NOTE — Telephone Encounter (Signed)
Please advise 

## 2020-02-21 NOTE — Telephone Encounter (Signed)
Oka for verbal orders from me.  Thank you.

## 2020-02-21 NOTE — Telephone Encounter (Signed)
OK for orders?

## 2020-02-21 NOTE — Telephone Encounter (Signed)
Called to request verbal orders for Skilled nursing - 1xwk 1, 2xwk 1, 1xwk 6.  If there are any questions, please call at [phone]

## 2020-02-21 NOTE — Telephone Encounter (Signed)
Transition Care Management Follow-up Telephone Call  Date of discharge and from where: 02/20/2020 [facility]  How have you been since you were released from the hospital? tired  Any questions or concerns? No  Items Reviewed:  Did the pt receive and understand the discharge instructions provided? Yes   Medications obtained and verified? Yes   Other? No   Any new allergies since your discharge? No   Dietary orders reviewed? Yes  Do you have support at home? Yes   Home Care and Equipment/Supplies: Were home health services ordered? yes If so, what is the name of the agency? [facility]  Has the agency set up a time to come to the patient's home? yes Were any new equipment or medical supplies ordered?  Yes: oxygen What is the name of the medical supply agency? [facility] Were you able to get the supplies/equipment? yes Do you have any questions related to the use of the equipment or supplies? No  Functional Questionnaire: (I = Independent and D = Dependent) ADLs: I  Bathing/Dressing- I  Meal Prep- I  Eating- I  Maintaining continence- I  Transferring/Ambulation- I  Managing Meds- I  Follow up appointments reviewed:   PCP Hospital f/u appt confirmed? Yes  Scheduled to see Dr. Wynetta Emery on 02/25/2020 @ 10:40.  Are transportation arrangements needed? No   If their condition worsens, is the pt aware to call PCP or go to the Emergency Dept.? Yes  Was the patient provided with contact information for the PCP's office or ED? Yes  Was to pt encouraged to call back with questions or concerns? Yes

## 2020-02-21 NOTE — Telephone Encounter (Signed)
Called and left message with ok for verbal orders.

## 2020-02-24 DIAGNOSIS — J9621 Acute and chronic respiratory failure with hypoxia: Secondary | ICD-10-CM | POA: Diagnosis not present

## 2020-02-24 DIAGNOSIS — I129 Hypertensive chronic kidney disease with stage 1 through stage 4 chronic kidney disease, or unspecified chronic kidney disease: Secondary | ICD-10-CM | POA: Diagnosis not present

## 2020-02-24 DIAGNOSIS — N189 Chronic kidney disease, unspecified: Secondary | ICD-10-CM | POA: Diagnosis not present

## 2020-02-24 DIAGNOSIS — J209 Acute bronchitis, unspecified: Secondary | ICD-10-CM | POA: Diagnosis not present

## 2020-02-24 DIAGNOSIS — J440 Chronic obstructive pulmonary disease with acute lower respiratory infection: Secondary | ICD-10-CM | POA: Diagnosis not present

## 2020-02-24 DIAGNOSIS — J441 Chronic obstructive pulmonary disease with (acute) exacerbation: Secondary | ICD-10-CM | POA: Diagnosis not present

## 2020-02-25 ENCOUNTER — Encounter: Payer: Self-pay | Admitting: Family Medicine

## 2020-02-25 ENCOUNTER — Ambulatory Visit (INDEPENDENT_AMBULATORY_CARE_PROVIDER_SITE_OTHER): Payer: Medicare Other | Admitting: Family Medicine

## 2020-02-25 ENCOUNTER — Other Ambulatory Visit: Payer: Self-pay

## 2020-02-25 VITALS — BP 180/70 | HR 69 | Temp 98.5°F | Wt 117.0 lb

## 2020-02-25 DIAGNOSIS — D72829 Elevated white blood cell count, unspecified: Secondary | ICD-10-CM

## 2020-02-25 DIAGNOSIS — B373 Candidiasis of vulva and vagina: Secondary | ICD-10-CM

## 2020-02-25 DIAGNOSIS — Z720 Tobacco use: Secondary | ICD-10-CM

## 2020-02-25 DIAGNOSIS — B3731 Acute candidiasis of vulva and vagina: Secondary | ICD-10-CM

## 2020-02-25 DIAGNOSIS — E871 Hypo-osmolality and hyponatremia: Secondary | ICD-10-CM | POA: Diagnosis not present

## 2020-02-25 DIAGNOSIS — E876 Hypokalemia: Secondary | ICD-10-CM

## 2020-02-25 DIAGNOSIS — D649 Anemia, unspecified: Secondary | ICD-10-CM | POA: Diagnosis not present

## 2020-02-25 DIAGNOSIS — J441 Chronic obstructive pulmonary disease with (acute) exacerbation: Secondary | ICD-10-CM

## 2020-02-25 DIAGNOSIS — I129 Hypertensive chronic kidney disease with stage 1 through stage 4 chronic kidney disease, or unspecified chronic kidney disease: Secondary | ICD-10-CM

## 2020-02-25 MED ORDER — FLUCONAZOLE 150 MG PO TABS: 150.0000 mg | ORAL_TABLET | Freq: Every day | ORAL | 0 refills | Status: DC

## 2020-02-25 NOTE — Assessment & Plan Note (Signed)
Doing much better. Feeling more like herself. Finished prednisone today. Recheck 1 week. Call with any concerns.

## 2020-02-25 NOTE — Assessment & Plan Note (Signed)
Rechecking labs today. Await results. Treat as needed.  °

## 2020-02-25 NOTE — Assessment & Plan Note (Signed)
Has not smoked in 8 days! Doing great! Continue patches. Call with any concerns.

## 2020-02-25 NOTE — Progress Notes (Signed)
BP (!) 180/70 (BP Location: Left Arm, Patient Position: Sitting, Cuff Size: Normal)   Pulse 69   Temp 98.5 F (36.9 C)   Wt 117 lb (53.1 kg)   LMP  (LMP Unknown)   SpO2 95%   BMI 23.63 kg/m    Subjective:    Patient ID: Kristi Clark, female    DOB: 13-Feb-1951, 69 y.o.   MRN: [id number]  HPI: Kristi Clark is a 69 y.o. female  Chief Complaint  Patient presents with  . Hospitalization Follow-up    Pt states she went to the hospital for COPD was admitted on 12/13 and discharged on 12/16   Transition of [facility] Follow up.   Hospital/Facility: [facility] D/C Physician: Dr. Courtney Heys D/C Date: 02/20/20  Records Requested: 02/25/20 Records Received:  02/25/20 Records Reviewed:  02/25/20  Diagnoses on Discharge: COPD exacerbation  Date of interactive Contact within 48 hours of discharge: 02/21/20 Contact was through: phone  Date of 7 day or 14 day face-to-face visit: 02/25/20   within 7 days  Outpatient Encounter Medications as of 02/25/2020  Medication Sig  . albuterol (VENTOLIN HFA) 108 (90 Base) MCG/ACT inhaler Inhale 2 puffs into the lungs every 6 (six) hours as needed for wheezing or shortness of breath.  Marland Kitchen aspirin 81 MG tablet Take 81 mg by mouth daily.  Marland Kitchen atorvastatin (LIPITOR) 40 MG tablet Take 1 tablet (40 mg total) by mouth daily.  . Cetirizine HCl (ZYRTEC PO) Take by mouth daily.  . clopidogrel (PLAVIX) 75 MG tablet Take 1 tablet (75 mg total) by mouth daily.  . fluticasone (FLONASE) 50 MCG/ACT nasal spray Place 2 sprays into both nostrils daily.  . Fluticasone-Salmeterol (ADVAIR) 250-50 MCG/DOSE AEPB Inhale 1 puff into the lungs 2 (two) times daily.  . hydrochlorothiazide (HYDRODIURIL) 25 MG tablet Take 1 tablet (25 mg total) by mouth daily.  . isosorbide mononitrate (IMDUR) 30 MG 24 hr tablet Take 1 tablet (30 mg total) by mouth daily.  Marland Kitchen losartan (COZAAR) 50 MG tablet Take 1 tablet (50 mg total) by mouth daily.  . metoprolol succinate (TOPROL-XL)  50 MG 24 hr tablet Take 1 tablet (50 mg total) by mouth daily. Take with or immediately following a meal.  . Multiple Vitamins-Minerals (CENTRUM SILVER ADULT 50+ PO) Take 1 tablet by mouth daily.  Marland Kitchen nystatin (MYCOSTATIN) 100000 UNIT/ML suspension Take 5 mLs (500,000 Units total) by mouth 4 (four) times daily for 7 days.  . pantoprazole (PROTONIX) 40 MG tablet Take 1 tablet (40 mg total) by mouth daily.  . predniSONE (DELTASONE) 20 MG tablet Take 2 tablets (40 mg total) by mouth daily for 5 days.  . Probiotic Product (PROBIOTIC-10 PO) Take by mouth. Every other day  . tiotropium (SPIRIVA) 18 MCG inhalation capsule Place 1 capsule (18 mcg total) into inhaler and inhale daily.  . Calcium Carbonate (CALCIUM 600 PO) Take by mouth daily. (Patient not taking: Reported on 02/25/2020)  . fluconazole (DIFLUCAN) 150 MG tablet Take 1 tablet (150 mg total) by mouth daily. May repeat after 24 hours if not better  . nortriptyline (PAMELOR) 10 MG capsule Take 1 capsule (10 mg total) by mouth at bedtime. (Patient not taking: No sig reported)  . ondansetron (ZOFRAN ODT) 4 MG disintegrating tablet Take 1 tablet (4 mg total) by mouth every 8 (eight) hours as needed for nausea or vomiting. (Patient not taking: No sig reported)  . [DISCONTINUED] guaiFENesin-codeine 100-10 MG/5ML syrup Take 5 mLs by mouth every 6 (six) hours.  No facility-administered encounter medications on file as of 02/25/2020.  Per Hospitalist: "Brief/Interim Summary:  Patient is a 84-year female with history of COPD, depression, GERD, hypertension, coronary artery disease who presented with complaints of cough, congestion, wheezing, shortness of breath and fever. Chest x-ray was suggestiveof acute bronchitis. She was admitted for the management of acute COPD exacerbation/bronchitis and was started on IV steroids. Hospital course remarkable for persistent cough and shortness of breath.  Today she feels much better.  She qualified for home  oxygen.  She is medically stable for discharge home today.  Following problems were addressed during her hospitalization:  Acute on chronic hypoxic respiratory failure: Secondary to COPD exacerbation. Continue supplemental oxygen. She is not on oxygen at home. She qualified for home oxygen  COPD exacerbation:Smoker. Not using oxygen at home. Uses inhalers. Follows with pulmonology.  Also started on doxycycline on admission.  We will discharge her on 5 days course of prednisone.  Patient has been strongly advised to quit smoking.  Hyponatremia/hypokalemia:Improved  Coronary artery disease: Takes aspirin, Plavix, Lipitor at home which we will continue  Hypertension:Currently well ,stable. Continue current medications  Tobacco [id number] forcessation.  Continue nicotine patch  Leukocytosis:Most likely secondary to steroids.   Resolved  Normocytic anemia: Hemoglobin dropped from 11.5to9.5 most likely from hemodilution.   Monitor as an outpatient  Debility/deconditioning: PT recommended home health."  Diagnostic Tests Reviewed: CLINICAL DATA:  Cough and shortness of breath for several days  EXAM: CHEST - 2 VIEW  COMPARISON:  07/01/2019  FINDINGS: Cardiac shadow is stable. Aortic calcifications are again noted. Increase in chronic scarring is noted bilaterally. Some increased bronchitic markings are noted centrally likely related to underlying bronchitis. No focal confluent infiltrate is seen. No effusion or pneumothorax is noted. Degenerative changes of the thoracic spine are seen.  IMPRESSION: Increased bronchitic markings bilaterally consistent with acute bronchitis superimposed over more chronic fibrotic changes.  Disposition: Home  Consults: None  Discharge Instructions:  Follow up here  Disease/illness Education: Discussed today  Home Health/Community Services Discussions/Referrals: N/A  Establishment or re-establishment of  referral orders for community resources: N/A  Discussion with other health care providers: None  Assessment and Support of treatment regimen adherence: Good  Appointments Coordinated with: Patient  Education for self-management, independent living, and ADLs: Discussed today  Has been feeling better. She notes that she has had the nurse come over a couple of times. She notes that the prednisone has really made her wired. She has been using the incentive spirometry. She has been using her patch and has not smoked in 8 days. She is not coughing. No wheezing. No fevers. No chills. She is otherwise feeling well with no other concerns or complaints at this time.   Relevant past medical, surgical, family and social history reviewed and updated as indicated. Interim medical history since our last visit reviewed. Allergies and medications reviewed and updated.  Review of Systems  Constitutional: Negative.   HENT: Negative.   Respiratory: Negative.   Cardiovascular: Negative.   Gastrointestinal: Negative.   Musculoskeletal: Negative.   Psychiatric/Behavioral: Negative.     Per HPI unless specifically indicated above     Objective:    BP (!) 180/70 (BP Location: Left Arm, Patient Position: Sitting, Cuff Size: Normal)   Pulse 69   Temp 98.5 F (36.9 C)   Wt 117 lb (53.1 kg)   LMP  (LMP Unknown)   SpO2 95%   BMI 23.63 kg/m   Wt Readings from Last 3 Encounters:  02/25/20  117 lb (53.1 kg)  02/17/20 116 lb 2.9 oz (52.7 kg)  02/14/20 115 lb (52.2 kg)    Physical Exam Vitals and nursing note reviewed.  Constitutional:      General: She is not in acute distress.    Appearance: Normal appearance. She is not ill-appearing, toxic-appearing or diaphoretic.  HENT:     Head: Normocephalic and atraumatic.     Right Ear: External ear normal.     Left Ear: External ear normal.     Nose: Nose normal.     Mouth/Throat:     Mouth: Mucous membranes are moist.     Pharynx: Oropharynx is clear.   Eyes:     General: No scleral icterus.       Right eye: No discharge.        Left eye: No discharge.     Extraocular Movements: Extraocular movements intact.     Conjunctiva/sclera: Conjunctivae normal.     Pupils: Pupils are equal, round, and reactive to light.  Cardiovascular:     Rate and Rhythm: Normal rate and regular rhythm.     Pulses: Normal pulses.     Heart sounds: Normal heart sounds. No murmur heard. No friction rub. No gallop.   Pulmonary:     Effort: Pulmonary effort is normal. No respiratory distress.     Breath sounds: Normal breath sounds. No stridor. No wheezing, rhonchi or rales.  Chest:     Chest wall: No tenderness.  Musculoskeletal:        General: Normal range of motion.     Cervical back: Normal range of motion and neck supple.  Skin:    General: Skin is warm and dry.     Capillary Refill: Capillary refill takes less than 2 seconds.     Coloration: Skin is not jaundiced or pale.     Findings: No bruising, erythema, lesion or rash.  Neurological:     General: No focal deficit present.     Mental Status: She is alert and oriented to person, place, and time. Mental status is at baseline.  Psychiatric:        Mood and Affect: Mood normal.        Behavior: Behavior normal.        Thought Content: Thought content normal.        Judgment: Judgment normal.     Results for orders placed or performed during the hospital encounter of 02/17/20  Resp Panel by RT-PCR (Flu A&B, Covid) Nasopharyngeal Swab   Specimen: Nasopharyngeal Swab; Nasopharyngeal(NP) swabs in vial transport medium  Result Value Ref Range   SARS Coronavirus 2 by RT PCR NEGATIVE NEGATIVE   Influenza A by PCR NEGATIVE NEGATIVE   Influenza B by PCR NEGATIVE NEGATIVE  Basic metabolic panel  Result Value Ref Range   Sodium 125 (L) 135 - 145 mmol/L   Potassium 3.2 (L) 3.5 - 5.1 mmol/L   Chloride 86 (L) 98 - 111 mmol/L   CO2 27 22 - 32 mmol/L   Glucose, Bld 108 (H) 70 - 99 mg/dL   BUN 13 8 -  23 mg/dL   Creatinine, Ser 0.84 0.44 - 1.00 mg/dL   Calcium 9.5 8.9 - 10.3 mg/dL   GFR, Estimated >60 >60 mL/min   Anion gap 12 5 - 15  CBC  Result Value Ref Range   WBC 14.0 (H) 4.0 - 10.5 K/uL   RBC 3.58 (L) 3.87 - 5.11 MIL/uL   Hemoglobin 11.5 (L) 12.0 - 15.0 g/dL  HCT 32.6 (L) 36.0 - 46.0 %   MCV 91.1 80.0 - 100.0 fL   MCH 32.1 26.0 - 34.0 pg   MCHC 35.3 30.0 - 36.0 g/dL   RDW 12.6 11.5 - 15.5 %   Platelets 279 150 - 400 K/uL   nRBC 0.0 0.0 - 0.2 %  Urinalysis, Complete w Microscopic Urine, Clean Catch  Result Value Ref Range   Color, Urine STRAW (A) YELLOW   APPearance CLEAR (A) CLEAR   Specific Gravity, Urine 1.004 (L) 1.005 - 1.030   pH 6.0 5.0 - 8.0   Glucose, UA NEGATIVE NEGATIVE mg/dL   Hgb urine dipstick NEGATIVE NEGATIVE   Bilirubin Urine NEGATIVE NEGATIVE   Ketones, ur NEGATIVE NEGATIVE mg/dL   Protein, ur NEGATIVE NEGATIVE mg/dL   Nitrite NEGATIVE NEGATIVE   Leukocytes,Ua NEGATIVE NEGATIVE   WBC, UA NONE SEEN 0 - 5 WBC/hpf   Bacteria, UA RARE (A) NONE SEEN   Squamous Epithelial / LPF 0-5 0 - 5  HIV Antibody (routine testing w rflx)  Result Value Ref Range   HIV Screen 4th Generation wRfx Non Reactive Non Reactive  Basic metabolic panel  Result Value Ref Range   Sodium 131 (L) 135 - 145 mmol/L   Potassium 4.3 3.5 - 5.1 mmol/L   Chloride 95 (L) 98 - 111 mmol/L   CO2 26 22 - 32 mmol/L   Glucose, Bld 133 (H) 70 - 99 mg/dL   BUN 10 8 - 23 mg/dL   Creatinine, Ser 0.63 0.44 - 1.00 mg/dL   Calcium 9.0 8.9 - 10.3 mg/dL   GFR, Estimated >60 >60 mL/min   Anion gap 10 5 - 15  Magnesium  Result Value Ref Range   Magnesium 1.7 1.7 - 2.4 mg/dL  Basic metabolic panel  Result Value Ref Range   Sodium 131 (L) 135 - 145 mmol/L   Potassium 4.6 3.5 - 5.1 mmol/L   Chloride 98 98 - 111 mmol/L   CO2 27 22 - 32 mmol/L   Glucose, Bld 102 (H) 70 - 99 mg/dL   BUN 13 8 - 23 mg/dL   Creatinine, Ser 0.62 0.44 - 1.00 mg/dL   Calcium 8.8 (L) 8.9 - 10.3 mg/dL   GFR,  Estimated >60 >60 mL/min   Anion gap 6 5 - 15  CBC with Differential/Platelet  Result Value Ref Range   WBC 15.0 (H) 4.0 - 10.5 K/uL   RBC 2.99 (L) 3.87 - 5.11 MIL/uL   Hemoglobin 9.5 (L) 12.0 - 15.0 g/dL   HCT 28.0 (L) 36.0 - 46.0 %   MCV 93.6 80.0 - 100.0 fL   MCH 31.8 26.0 - 34.0 pg   MCHC 33.9 30.0 - 36.0 g/dL   RDW 13.1 11.5 - 15.5 %   Platelets 260 150 - 400 K/uL   nRBC 0.0 0.0 - 0.2 %   Neutrophils Relative % 78 %   Neutro Abs 11.8 (H) 1.7 - 7.7 K/uL   Lymphocytes Relative 15 %   Lymphs Abs 2.2 0.7 - 4.0 K/uL   Monocytes Relative 6 %   Monocytes Absolute 0.9 0.1 - 1.0 K/uL   Eosinophils Relative 0 %   Eosinophils Absolute 0.0 0.0 - 0.5 K/uL   Basophils Relative 0 %   Basophils Absolute 0.0 0.0 - 0.1 K/uL   Immature Granulocytes 1 %   Abs Immature Granulocytes 0.11 (H) 0.00 - 0.07 K/uL  CBC with Differential/Platelet  Result Value Ref Range   WBC 7.2 4.0 - 10.5 K/uL  RBC 3.04 (L) 3.87 - 5.11 MIL/uL   Hemoglobin 9.7 (L) 12.0 - 15.0 g/dL   HCT 28.6 (L) 36.0 - 46.0 %   MCV 94.1 80.0 - 100.0 fL   MCH 31.9 26.0 - 34.0 pg   MCHC 33.9 30.0 - 36.0 g/dL   RDW 13.3 11.5 - 15.5 %   Platelets 246 150 - 400 K/uL   nRBC 0.0 0.0 - 0.2 %   Neutrophils Relative % 81 %   Neutro Abs 5.8 1.7 - 7.7 K/uL   Lymphocytes Relative 16 %   Lymphs Abs 1.2 0.7 - 4.0 K/uL   Monocytes Relative 2 %   Monocytes Absolute 0.1 0.1 - 1.0 K/uL   Eosinophils Relative 0 %   Eosinophils Absolute 0.0 0.0 - 0.5 K/uL   Basophils Relative 0 %   Basophils Absolute 0.0 0.0 - 0.1 K/uL   WBC Morphology MORPHOLOGY UNREMARKABLE    RBC Morphology MORPHOLOGY UNREMARKABLE    Smear Review Normal platelet morphology    Immature Granulocytes 1 %   Abs Immature Granulocytes 0.08 (H) 0.00 - 0.07 K/uL  Basic metabolic panel  Result Value Ref Range   Sodium 134 (L) 135 - 145 mmol/L   Potassium 4.8 3.5 - 5.1 mmol/L   Chloride 98 98 - 111 mmol/L   CO2 29 22 - 32 mmol/L   Glucose, Bld 169 (H) 70 - 99 mg/dL   BUN  15 8 - 23 mg/dL   Creatinine, Ser 0.62 0.44 - 1.00 mg/dL   Calcium 9.3 8.9 - 10.3 mg/dL   GFR, Estimated >60 >60 mL/min   Anion gap 7 5 - 15      Assessment & Plan:   Problem List Items Addressed This Visit      Cardiovascular and Mediastinum   Benign hypertension with chronic kidney disease    Running high again- still on prednisone and didn't sleep last night. Will recheck in 1 week off prednisone, if still high go back up on her losartan.         Respiratory   COPD with acute exacerbation ([place]) - Primary    Doing much better. Feeling more like herself. Finished prednisone today. Recheck 1 week. Call with any concerns.         Other   Anemia    Rechecking labs today. Await results. Treat as needed.       Relevant Orders   CBC with Differential/Platelet   Tobacco abuse    Has not smoked in 8 days! Doing great! Continue patches. Call with any concerns.       Hyponatremia    Rechecking labs today. Await results. Treat as needed.       Relevant Orders   Comprehensive metabolic panel   Hypokalemia    Rechecking labs today. Await results. Treat as needed.       Relevant Orders   Comprehensive metabolic panel    Other Visit Diagnoses    Leukocytosis, unspecified type       Rechecking labs today. Await results. Treat as needed.    Relevant Orders   CBC with Differential/Platelet   Vaginal yeast infection       Will treat with diflucan. Call with any concerns.    Relevant Medications   fluconazole (DIFLUCAN) 150 MG tablet       Follow up plan: Return in about 1 week (around 03/03/2020).   >25 minutes spent with patient today.

## 2020-02-25 NOTE — Assessment & Plan Note (Signed)
Running high again- still on prednisone and didn't sleep last night. Will recheck in 1 week off prednisone, if still high go back up on her losartan.

## 2020-02-26 DIAGNOSIS — R0600 Dyspnea, unspecified: Secondary | ICD-10-CM | POA: Diagnosis not present

## 2020-02-26 DIAGNOSIS — J449 Chronic obstructive pulmonary disease, unspecified: Secondary | ICD-10-CM | POA: Diagnosis not present

## 2020-02-26 LAB — CBC WITH DIFFERENTIAL/PLATELET
Basophils Absolute: 0 10*3/uL (ref 0.0–0.2)
Basos: 0 %
EOS (ABSOLUTE): 0 10*3/uL (ref 0.0–0.4)
Eos: 0 %
Hematocrit: 33.8 % — ABNORMAL LOW (ref 34.0–46.6)
Hemoglobin: 11.4 g/dL (ref 11.1–15.9)
Immature Grans (Abs): 0.1 10*3/uL (ref 0.0–0.1)
Immature Granulocytes: 1 %
Lymphocytes Absolute: 1.8 10*3/uL (ref 0.7–3.1)
Lymphs: 15 %
MCH: 31.8 pg (ref 26.6–33.0)
MCHC: 33.7 g/dL (ref 31.5–35.7)
MCV: 94 fL (ref 79–97)
Monocytes Absolute: 0.5 10*3/uL (ref 0.1–0.9)
Monocytes: 4 %
Neutrophils Absolute: 9.7 10*3/uL — ABNORMAL HIGH (ref 1.4–7.0)
Neutrophils: 80 %
Platelets: 369 10*3/uL (ref 150–450)
RBC: 3.59 x10E6/uL — ABNORMAL LOW (ref 3.77–5.28)
RDW: 12.2 % (ref 11.7–15.4)
WBC: 12.1 10*3/uL — ABNORMAL HIGH (ref 3.4–10.8)

## 2020-02-26 LAB — COMPREHENSIVE METABOLIC PANEL
ALT: 32 IU/L (ref 0–32)
AST: 23 IU/L (ref 0–40)
Albumin/Globulin Ratio: 1.7 (ref 1.2–2.2)
Albumin: 4 g/dL (ref 3.7–4.7)
Alkaline Phosphatase: 74 IU/L (ref 44–121)
BUN/Creatinine Ratio: 22 (ref 12–28)
BUN: 16 mg/dL (ref 8–27)
Bilirubin Total: 0.4 mg/dL (ref 0.0–1.2)
CO2: 30 mmol/L — ABNORMAL HIGH (ref 20–29)
Calcium: 9.7 mg/dL (ref 8.7–10.3)
Chloride: 91 mmol/L — ABNORMAL LOW (ref 96–106)
Creatinine, Ser: 0.72 mg/dL (ref 0.57–1.00)
GFR calc Af Amer: 97 mL/min/{1.73_m2} (ref >59)
GFR calc non Af Amer: 85 mL/min/{1.73_m2} (ref >59)
Globulin, Total: 2.4 g/dL (ref 1.5–4.5)
Glucose: 92 mg/dL (ref 65–99)
Potassium: 3.7 mmol/L (ref 3.5–5.2)
Sodium: 135 mmol/L (ref 134–144)
Total Protein: 6.4 g/dL (ref 6.0–8.5)

## 2020-03-02 ENCOUNTER — Emergency Department
Admission: EM | Admit: 2020-03-02 | Discharge: 2020-03-02 | Disposition: A | Discharge status: Home / Self Care | Payer: Medicare Other | Attending: Emergency Medicine | Admitting: Emergency Medicine

## 2020-03-02 ENCOUNTER — Other Ambulatory Visit: Payer: Self-pay

## 2020-03-02 ENCOUNTER — Emergency Department: Payer: Medicare Other

## 2020-03-02 ENCOUNTER — Encounter: Payer: Self-pay | Admitting: Emergency Medicine

## 2020-03-02 ENCOUNTER — Ambulatory Visit: Payer: Self-pay | Admitting: *Deleted

## 2020-03-02 DIAGNOSIS — Z7982 Long term (current) use of aspirin: Secondary | ICD-10-CM | POA: Insufficient documentation

## 2020-03-02 DIAGNOSIS — T380X6A Underdosing of glucocorticoids and synthetic analogues, initial encounter: Secondary | ICD-10-CM | POA: Insufficient documentation

## 2020-03-02 DIAGNOSIS — I10 Essential (primary) hypertension: Secondary | ICD-10-CM | POA: Diagnosis not present

## 2020-03-02 DIAGNOSIS — Z79899 Other long term (current) drug therapy: Secondary | ICD-10-CM | POA: Insufficient documentation

## 2020-03-02 DIAGNOSIS — F17210 Nicotine dependence, cigarettes, uncomplicated: Secondary | ICD-10-CM | POA: Diagnosis not present

## 2020-03-02 DIAGNOSIS — N182 Chronic kidney disease, stage 2 (mild): Secondary | ICD-10-CM | POA: Insufficient documentation

## 2020-03-02 DIAGNOSIS — Z7952 Long term (current) use of systemic steroids: Secondary | ICD-10-CM | POA: Diagnosis not present

## 2020-03-02 DIAGNOSIS — I129 Hypertensive chronic kidney disease with stage 1 through stage 4 chronic kidney disease, or unspecified chronic kidney disease: Secondary | ICD-10-CM | POA: Insufficient documentation

## 2020-03-02 DIAGNOSIS — Z7951 Long term (current) use of inhaled steroids: Secondary | ICD-10-CM | POA: Diagnosis not present

## 2020-03-02 DIAGNOSIS — I2510 Atherosclerotic heart disease of native coronary artery without angina pectoris: Secondary | ICD-10-CM | POA: Insufficient documentation

## 2020-03-02 DIAGNOSIS — R4182 Altered mental status, unspecified: Secondary | ICD-10-CM | POA: Insufficient documentation

## 2020-03-02 DIAGNOSIS — M25512 Pain in left shoulder: Secondary | ICD-10-CM | POA: Diagnosis not present

## 2020-03-02 DIAGNOSIS — T380X5A Adverse effect of glucocorticoids and synthetic analogues, initial encounter: Secondary | ICD-10-CM

## 2020-03-02 DIAGNOSIS — J441 Chronic obstructive pulmonary disease with (acute) exacerbation: Secondary | ICD-10-CM | POA: Diagnosis not present

## 2020-03-02 LAB — BASIC METABOLIC PANEL
Anion gap: 9 (ref 5–15)
BUN: 17 mg/dL (ref 8–23)
CO2: 29 mmol/L (ref 22–32)
Calcium: 9.1 mg/dL (ref 8.9–10.3)
Chloride: 93 mmol/L — ABNORMAL LOW (ref 98–111)
Creatinine, Ser: 0.85 mg/dL (ref 0.44–1.00)
GFR, Estimated: >60 mL/min (ref >60)
Glucose, Bld: 117 mg/dL — ABNORMAL HIGH (ref 70–99)
Potassium: 3.2 mmol/L — ABNORMAL LOW (ref 3.5–5.1)
Sodium: 131 mmol/L — ABNORMAL LOW (ref 135–145)

## 2020-03-02 LAB — CBC
HCT: 32.6 % — ABNORMAL LOW (ref 36.0–46.0)
Hemoglobin: 11 g/dL — ABNORMAL LOW (ref 12.0–15.0)
MCH: 31.3 pg (ref 26.0–34.0)
MCHC: 33.7 g/dL (ref 30.0–36.0)
MCV: 92.9 fL (ref 80.0–100.0)
Platelets: 296 10*3/uL (ref 150–400)
RBC: 3.51 MIL/uL — ABNORMAL LOW (ref 3.87–5.11)
RDW: 13.8 % (ref 11.5–15.5)
WBC: 6.7 10*3/uL (ref 4.0–10.5)
nRBC: 0 % (ref 0.0–0.2)

## 2020-03-02 LAB — TROPONIN I (HIGH SENSITIVITY)
Troponin I (High Sensitivity): 10 ng/L (ref <18)
Troponin I (High Sensitivity): 7 ng/L (ref <18)

## 2020-03-02 MED ORDER — ACETAMINOPHEN 325 MG PO TABS
650.0000 mg | ORAL_TABLET | Freq: Once | ORAL | Status: AC
  Administered 2020-03-02: 19:00:00 650 mg via ORAL
  Filled 2020-03-02: qty 2

## 2020-03-02 MED ORDER — IBUPROFEN 400 MG PO TABS
400.0000 mg | ORAL_TABLET | Freq: Once | ORAL | Status: DC | PRN
  Filled 2020-03-02: qty 1

## 2020-03-02 NOTE — Telephone Encounter (Signed)
Patient's daughter Ivin Booty calling with complaints of the patient experiencing confusion for the past 2 days.Patient's daughter was not with the patient at the time of call. Patient's daughter states that that the daughter has not been comprehending things, not explaining things correctly, and repeating things that do not make sense. Patient's daughter states that the patient was in the hospital recently for a COPD flare up. Patient's daughter states that the patient has not slept more than 6 hours in the past 3 days. Patient is refusing to go to the ED per patient's daughter. Patient's daughter calling to see if patient could be worked in for an appt with a provider. Ivin Booty, patient's daughter can be contacted at [phone].  Reason for Disposition  Very strange or paranoid behavior  Answer Assessment - Initial Assessment Questions 1. LEVEL OF CONSCIOUSNESS: "How is he (she, the patient) acting right now?" (e.g., alert-oriented, confused, lethargic, stuporous, comatose)    Confusion 2. ONSET: "When did the confusion start?"  (minutes, hours, days)     2 days 3. PATTERN "Does this come and go, or has it been constant since it started?"  "Is it present now?"     Has been constant and is present now 4. ALCOHOL or DRUGS: "Has he been drinking alcohol or taking any drugs?"      No 5. NARCOTIC MEDICATIONS: "Has he been receiving any narcotic medications?" (e.g., morphine, Vicodin)     No 6. CAUSE: "What do you think is causing the confusion?"      Unsure 7. OTHER SYMPTOMS: "Are there any other symptoms?" (e.g., difficulty breathing, headache, fever, weakness)     Patient had complaints of feeling lightheaded  Protocols used: CONFUSION - DELIRIUM-A-AH

## 2020-03-02 NOTE — Telephone Encounter (Signed)
Needs to go to ER

## 2020-03-02 NOTE — ED Triage Notes (Signed)
Kristi Clark, tripped over dog on 12/22.  States for the last couple of days c/o chest pain and left shoulder pain and unsteady gait.  Patient is AAOx3.  Skin warm and dry. NAD.  Speech clear.  MAE equally and strong.

## 2020-03-02 NOTE — Telephone Encounter (Signed)
She needs to go to the ER so we can make sure she didn't have a stroke- she can come in and I'm going to send her to the ER

## 2020-03-02 NOTE — Telephone Encounter (Signed)
Called and spoke with patient, she will go to the Emergency Room

## 2020-03-02 NOTE — ED Notes (Signed)
Pt states she fell on Thursday, pt's daughter states "she hasn't been herself since."

## 2020-03-02 NOTE — Telephone Encounter (Signed)
Patient's daughter states that she refuses to go to the ER and that it took her 2 days to get her to agree to come see [place], daughter thinks that it is due to lack of sleep. Patients other daughter is in town, so they would be able to bring her into the office.

## 2020-03-02 NOTE — Discharge Instructions (Signed)
As we discussed, I suspect that Kristi Clark was acting oddly mostly because of the steroids that she was taking for her lungs.  She should continue to get better as this medicine while she is out of her body.

## 2020-03-02 NOTE — ED Provider Notes (Signed)
[facility] Emergency Department Provider Note ____________________________________________   Event Date/Time   First MD Initiated Contact with Patient 03/02/20 1932     (approximate)  I have reviewed the triage vital signs and the nursing notes.  HISTORY  Chief Complaint Shoulder Pain   HPI Kristi Clark is a 69 y.o. femalewho presents to the ED for evaluation of shoulder pain.  Chart review indicates patient is a DNR, history of COPD, depression, GERD, CAD and HTN.  On DAPT but no AC. Admitted earlier this month for a COPD exacerbation, discharged on prednisone.  Patient presents to the ED with her daughter due to concerns that "she is not acting right, she is crazy and she will not sleep at night."  Daughter reports that patient was better today, but indicates that the past few nights she has not been sleeping and seems "out of her mind."  Patient struggles at this and indicates that she has not been sleeping well at night, and she has been quite energetic recently.  She reports feeling sleepier today.  Denies recent illnesses, fevers.  She does indicate that she fell last week onto her left side and face, due to getting tripped up on her feet.   Past Medical History:  Diagnosis Date  . COPD (chronic obstructive pulmonary disease) ([place])   . Depression   . GERD (gastroesophageal reflux disease)   . Hyperlipidemia   . Hypertension   . Menopause   . Osteopenia   . Tobacco abuse     Patient Active Problem List   Diagnosis Date Noted  . COPD with acute exacerbation ([place]) 02/17/2020  . Hyponatremia 02/17/2020  . Hypokalemia 02/17/2020  . Respiratory failure, acute ([place]) 02/17/2020  . Nicotine dependence 02/17/2020  . Postherpetic neuralgia 11/24/2019  . Cough 10/29/2019  . Tobacco abuse 10/17/2017  . IBS (irritable bowel syndrome) 04/04/2017  . Advanced care planning/counseling discussion 10/03/2016  . CAD (coronary artery disease)  10/03/2016  . History of anemia 10/03/2016  . Benign hypertension with chronic kidney disease 03/06/2015  . Allergic rhinitis 09/03/2014  . 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 GERD (gastroesophageal reflux disease) 09/03/2014  . Hyperlipidemia 09/03/2014  . Anemia 07/14/2014  . COPD, severe ([place]) 08/08/2013  . Pulmonary nodules 08/08/2013    Past Surgical History:  Procedure Laterality Date  . ABDOMINAL HYSTERECTOMY    . CARDIAC CATHETERIZATION    . CHOLECYSTECTOMY  2014  . COLONOSCOPY WITH PROPOFOL N/A 01/16/2018   Procedure: COLONOSCOPY WITH PROPOFOL;  Surgeon: Lollie Sails, MD;  Location: [facility] ENDOSCOPY;  Service: Endoscopy;  Laterality: N/A;  . ESOPHAGOGASTRODUODENOSCOPY (EGD) WITH PROPOFOL N/A 01/16/2018   Procedure: ESOPHAGOGASTRODUODENOSCOPY (EGD) WITH PROPOFOL;  Surgeon: Lollie Sails, MD;  Location: [facility] ENDOSCOPY;  Service: Endoscopy;  Laterality: N/A;  . heart stent      Prior to Admission medications   Medication Sig Start Date End Date Taking? Authorizing Provider  albuterol (VENTOLIN HFA) 108 (90 Base) MCG/ACT inhaler Inhale 2 puffs into the lungs every 6 (six) hours as needed for wheezing or shortness of breath. 03/18/19   Volney American, PA-C  aspirin 81 MG tablet Take 81 mg by mouth daily.    [provider]  atorvastatin (LIPITOR) 40 MG tablet Take 1 tablet (40 mg total) by mouth daily. 10/21/19   Volney American, PA-C  Calcium Carbonate (CALCIUM 600 PO) Take by mouth daily. Patient not taking: Reported on 02/25/2020    [provider]  Cetirizine  HCl (ZYRTEC PO) Take by mouth daily.    [provider]  clopidogrel (PLAVIX) 75 MG tablet Take 1 tablet (75 mg total) by mouth daily. 10/21/19   Volney American, PA-C  fluconazole (DIFLUCAN) 150 MG tablet Take 1 tablet (150 mg total) by mouth daily. May repeat after 24 hours if not better 02/25/20   Wynetta Emery, Megan P, DO  fluticasone (FLONASE) 50  MCG/ACT nasal spray Place 2 sprays into both nostrils daily. 10/21/19   Volney American, PA-C  Fluticasone-Salmeterol (ADVAIR) 250-50 MCG/DOSE AEPB Inhale 1 puff into the lungs 2 (two) times daily. 10/08/19   Eulogio Bear, NP  hydrochlorothiazide (HYDRODIURIL) 25 MG tablet Take 1 tablet (25 mg total) by mouth daily. 10/21/19   Volney American, PA-C  isosorbide mononitrate (IMDUR) 30 MG 24 hr tablet Take 1 tablet (30 mg total) by mouth daily. 10/21/19   Volney American, PA-C  losartan (COZAAR) 50 MG tablet Take 1 tablet (50 mg total) by mouth daily. 11/18/19   Johnson, Megan P, DO  metoprolol succinate (TOPROL-XL) 50 MG 24 hr tablet Take 1 tablet (50 mg total) by mouth daily. Take with or immediately following a meal. 10/21/19   Volney American, PA-C  Multiple Vitamins-Minerals (CENTRUM SILVER ADULT 50+ PO) Take 1 tablet by mouth daily.    [provider]  nortriptyline (PAMELOR) 10 MG capsule Take 1 capsule (10 mg total) by mouth at bedtime. Patient not taking: No sig reported 11/18/19   Park Liter P, DO  ondansetron (ZOFRAN ODT) 4 MG disintegrating tablet Take 1 tablet (4 mg total) by mouth every 8 (eight) hours as needed for nausea or vomiting. Patient not taking: No sig reported 11/08/19   Park Liter P, DO  pantoprazole (PROTONIX) 40 MG tablet Take 1 tablet (40 mg total) by mouth daily. 10/21/19   Volney American, PA-C  Probiotic Product (PROBIOTIC-10 PO) Take by mouth. Every other day    [provider]  tiotropium (SPIRIVA) 18 MCG inhalation capsule Place 1 capsule (18 mcg total) into inhaler and inhale daily. 09/05/19   Volney American, PA-C    Allergies Clindamycin/lincomycin, Amoxicillin, Avelox [moxifloxacin hcl in nacl], Codeine sulfate, Penicillins, and Sulfa antibiotics  Family History  Problem Relation Age of Onset  . Cancer Mother   . Stroke Mother   . Heart disease Father   . Hyperlipidemia Father   . Cancer Sister    . Diabetes Sister   . Breast cancer Sister 34  . Diabetes Brother   . Asthma Son   . Cancer Son   . Diabetes Daughter   . Hypertension Daughter   . Cancer Maternal Grandmother        gallbladder  . Diabetes Brother   . Heart disease Brother   . Breast cancer Paternal Aunt     Social History Social History   Tobacco Use  . Smoking status: Current Every Day Smoker    Packs/day: 2.00    Years: 56.00    Pack years: 112.00    Types: Cigarettes  . Smokeless tobacco: Never Used  . Tobacco comment: has the gum and patch.   Vaping Use  . Vaping Use: Never used  Substance Use Topics  . Alcohol use: No  . Drug use: No    Review of Systems  Constitutional: No fever/chills.  Positive for altered mentation Eyes: No visual changes. ENT: No sore throat. Cardiovascular: Denies chest pain. Respiratory: Denies shortness of breath. Gastrointestinal: No abdominal pain.  No  nausea, no vomiting.  No diarrhea.  No constipation. Genitourinary: Negative for dysuria. Musculoskeletal: Negative for back pain. Skin: Negative for rash. Neurological: Negative for headaches, focal weakness or numbness.  ____________________________________________   PHYSICAL EXAM:  VITAL SIGNS: Vitals:   03/02/20 1948 03/02/20 2252  BP: (!) 101/57 (!) 152/56  Pulse: 81 71  Resp: 16 18  Temp:  97.6 F (36.4 C)  SpO2: 96% 96%     Constitutional: Alert and oriented. Well appearing and in no acute distress. Eyes: Conjunctivae are normal. PERRL. EOMI. Head: Superficial abrasion to the left forehead, about 2 cm in diameter.  No underlying induration, bony step-offs, fluctuance or erythema. Nose: No congestion/rhinnorhea. Mouth/Throat: Mucous membranes are moist.  Oropharynx non-erythematous. Neck: No stridor. No cervical spine tenderness to palpation. Cardiovascular: Normal rate, regular rhythm. Grossly normal heart sounds.  Good peripheral circulation. Respiratory: Normal respiratory effort.  No  retractions. Lungs CTAB. Gastrointestinal: Soft , nondistended, nontender to palpation. No CVA tenderness. Musculoskeletal: No lower extremity tenderness nor edema.  No joint effusions. No signs of acute trauma. Patient fully ranges all 4 extremities without pain or limitations. Full palpation of all 4 extremities without evidence of deformity traumatic pathology. No spinal step-offs to the back. Neurologic:  Normal speech and language. No gross focal neurologic deficits are appreciated. No gait instability noted. Cranial nerves II through XII intact 5/5 strength and sensation in all 4 extremities Skin:  Skin is warm, dry and intact. No rash noted. Psychiatric: Mood and affect are normal. Speech and behavior are normal.  ____________________________________________   LABS (all labs ordered are listed, but only abnormal results are displayed)  Labs Reviewed  BASIC METABOLIC PANEL - Abnormal; Notable for the following components:      Result Value   Sodium 131 (*)    Potassium 3.2 (*)    Chloride 93 (*)    Glucose, Bld 117 (*)    All other components within normal limits  CBC - Abnormal; Notable for the following components:   RBC 3.51 (*)    Hemoglobin 11.0 (*)    HCT 32.6 (*)    All other components within normal limits  TROPONIN I (HIGH SENSITIVITY)  TROPONIN I (HIGH SENSITIVITY)   ____________________________________________  12 Lead EKG  Sinus rhythm, rate of 70 bpm.  Normal axis.  Normal intervals.  No evidence of acute ischemia. ____________________________________________  RADIOLOGY  ED MD interpretation: CXR reviewed by me without evidence of acute cardiopulmonary pathology.  CT head reviewed by me without evidence of acute cranial pathology.  Official radiology report(s): DG Chest 2 View  Result Date: 03/02/2020 CLINICAL DATA:  Fall 02/24/2020.  Left shoulder pain EXAM: CHEST - 2 VIEW COMPARISON:  02/17/2020 FINDINGS: Underlying chronic lung disease with  apical scarring as noted on prior CT. Pulmonary hyperinflation. Interval improvement in increased bronchovascular markings throughout both lungs compatible with resolving bronchitis or pneumonia. Negative for heart failure. No pleural effusion identified. Heart size normal.  Atherosclerotic calcification aortic arch. IMPRESSION: Interval improvement in diffuse bilateral airspace disease likely due to clearing infection. Underlying chronic lung disease with hyperinflation and pulmonary scarring especially in the apices. Electronically Signed   By: Franchot Gallo M.D.   On: 03/02/2020 15:39   CT Head Wo Contrast  Result Date: 03/02/2020 CLINICAL DATA:  Mental status change fall EXAM: CT HEAD WITHOUT CONTRAST TECHNIQUE: Contiguous axial images were obtained from the base of the skull through the vertex without intravenous contrast. COMPARISON:  CT brain 11/04/2019 FINDINGS: Brain: No acute territorial infarction,  hemorrhage or intracranial mass. The ventricles are stable in size. Vascular: No hyperdense vessels.  Carotid vascular calcification Skull: Normal. Negative for fracture or focal lesion. Sinuses/Orbits: No acute finding. Other: None IMPRESSION: No CT evidence for acute intracranial abnormality. Electronically Signed   By: Donavan Foil M.D.   On: 03/02/2020 21:46    ____________________________________________   PROCEDURES and INTERVENTIONS  Procedure(s) performed (including Critical Care):  .1-3 Lead EKG Interpretation Performed by: Vladimir Crofts, MD Authorized by: Vladimir Crofts, MD     Interpretation: normal     ECG rate:  70   ECG rate assessment: normal     Rhythm: sinus rhythm     Ectopy: none     Conduction: normal      Medications  ibuprofen (ADVIL) tablet 400 mg (400 mg Oral Patient Refused/Not Given 03/02/20 1856)  acetaminophen (TYLENOL) tablet 650 mg (650 mg Oral Given 03/02/20 1900)    ____________________________________________   MDM / ED COURSE   69 year old  woman presents from home with her daughter due to improving insomnia and altered mentation, likely an adverse effect from her recent prednisone usage, and amenable to outpatient management.  Normal vitals on room air.  Exam generally reassuring without evidence of acute derangements, distress or neurovascular deficits.  She is fully alert, oriented with linear thought processes.  Daughter reports that she is generally getting better today, and seems "about normal" now.  Patient has a benign work-up and I see no evidence of medical pathology to preclude outpatient management.  She is likely experiencing psychosis from her steroid usage.  We discussed return precautions for the ED and patient is medically stable for discharge home.   Clinical Course as of 03/02/20 2344  Mon Mar 02, 2020  2217 Reassessed.  Daughter reports that patient looks well and has no concerns.  We discussed outpatient management.  We discussed possible prednisone related complications.  And we discussed return precautions for the ED. [DS]    Clinical Course User Index [DS] Vladimir Crofts, MD    ____________________________________________   FINAL CLINICAL IMPRESSION(S) / ED DIAGNOSES  Final diagnoses:  Altered mental status, unspecified altered mental status type  Prednisone adverse reaction, initial encounter     ED Discharge Orders    None       Kyuss Hale Tamala Julian   Note:  This document was prepared using Dragon voice recognition software and may include unintentional dictation errors.   Vladimir Crofts, MD 03/02/20 2351

## 2020-03-03 NOTE — Telephone Encounter (Signed)
FYI

## 2020-03-03 NOTE — Telephone Encounter (Signed)
Copied from CRM [id number]. Topic: General - Other >> Mar 03, 2020  4:38 PM Dalphine Handing A wrote: Pt called to inform Dr. Laural Benes that she went to er last night no stroke or heart attack but provider thought she was taking too much steroids. Patient took melatonin 10mg  and went to sleep last night.  Patient stated she is feeling a lot  better and wanted to let Dr. know.

## 2020-03-04 ENCOUNTER — Telehealth: Payer: Self-pay | Admitting: Family Medicine

## 2020-03-04 ENCOUNTER — Ambulatory Visit: Payer: Medicare Other | Admitting: Family Medicine

## 2020-03-04 DIAGNOSIS — J209 Acute bronchitis, unspecified: Secondary | ICD-10-CM | POA: Diagnosis not present

## 2020-03-04 DIAGNOSIS — J441 Chronic obstructive pulmonary disease with (acute) exacerbation: Secondary | ICD-10-CM | POA: Diagnosis not present

## 2020-03-04 DIAGNOSIS — I129 Hypertensive chronic kidney disease with stage 1 through stage 4 chronic kidney disease, or unspecified chronic kidney disease: Secondary | ICD-10-CM | POA: Diagnosis not present

## 2020-03-04 DIAGNOSIS — J440 Chronic obstructive pulmonary disease with acute lower respiratory infection: Secondary | ICD-10-CM | POA: Diagnosis not present

## 2020-03-04 DIAGNOSIS — J9621 Acute and chronic respiratory failure with hypoxia: Secondary | ICD-10-CM | POA: Diagnosis not present

## 2020-03-04 DIAGNOSIS — N189 Chronic kidney disease, unspecified: Secondary | ICD-10-CM | POA: Diagnosis not present

## 2020-03-04 NOTE — Telephone Encounter (Signed)
Verbal orders given  

## 2020-03-04 NOTE — Telephone Encounter (Signed)
Home Health Verbal Orders - Caller/Agency: [facility] Callback Number: [phone] secure  Requesting OT/PT/Skilled Nursing/Social Work/Speech Therapy: PT Frequency: 2x for 2wks, 1x for 4wks

## 2020-03-04 NOTE — Telephone Encounter (Signed)
OK for verbal orders?

## 2020-03-05 ENCOUNTER — Ambulatory Visit: Admitting: Family Medicine

## 2020-03-05 ENCOUNTER — Ambulatory Visit: Payer: Medicare Other

## 2020-03-05 ENCOUNTER — Other Ambulatory Visit: Payer: Self-pay

## 2020-03-05 DIAGNOSIS — I129 Hypertensive chronic kidney disease with stage 1 through stage 4 chronic kidney disease, or unspecified chronic kidney disease: Secondary | ICD-10-CM | POA: Diagnosis not present

## 2020-03-05 DIAGNOSIS — N189 Chronic kidney disease, unspecified: Secondary | ICD-10-CM | POA: Diagnosis not present

## 2020-03-05 DIAGNOSIS — J440 Chronic obstructive pulmonary disease with acute lower respiratory infection: Secondary | ICD-10-CM | POA: Diagnosis not present

## 2020-03-05 DIAGNOSIS — J209 Acute bronchitis, unspecified: Secondary | ICD-10-CM | POA: Diagnosis not present

## 2020-03-05 DIAGNOSIS — J441 Chronic obstructive pulmonary disease with (acute) exacerbation: Secondary | ICD-10-CM | POA: Diagnosis not present

## 2020-03-05 DIAGNOSIS — Z20822 Contact with and (suspected) exposure to covid-19: Secondary | ICD-10-CM

## 2020-03-05 DIAGNOSIS — J9621 Acute and chronic respiratory failure with hypoxia: Secondary | ICD-10-CM | POA: Diagnosis not present

## 2020-03-07 LAB — SARS-COV-2, NAA 2 DAY TAT

## 2020-03-07 LAB — NOVEL CORONAVIRUS, NAA: SARS-CoV-2, NAA: NOT DETECTED

## 2020-03-09 ENCOUNTER — Telehealth: Payer: Medicare Other | Admitting: Family Medicine

## 2020-03-09 ENCOUNTER — Encounter: Payer: Self-pay | Admitting: Family Medicine

## 2020-03-09 ENCOUNTER — Telehealth (INDEPENDENT_AMBULATORY_CARE_PROVIDER_SITE_OTHER): Payer: Medicare Other | Admitting: Family Medicine

## 2020-03-09 VITALS — BP 122/69 | HR 77

## 2020-03-09 DIAGNOSIS — F19950 Other psychoactive substance use, unspecified with psychoactive substance-induced psychotic disorder with delusions: Secondary | ICD-10-CM | POA: Diagnosis not present

## 2020-03-09 DIAGNOSIS — I129 Hypertensive chronic kidney disease with stage 1 through stage 4 chronic kidney disease, or unspecified chronic kidney disease: Secondary | ICD-10-CM | POA: Diagnosis not present

## 2020-03-09 DIAGNOSIS — Z20822 Contact with and (suspected) exposure to covid-19: Secondary | ICD-10-CM

## 2020-03-09 NOTE — Assessment & Plan Note (Signed)
Back to normal off her steroids. Continue current regimen. Continue to monitor.

## 2020-03-09 NOTE — Progress Notes (Signed)
BP 122/69   Pulse 77   LMP  (LMP Unknown)    Subjective:    Patient ID: Kristi Clark, female    DOB: 06-19-1950, 70 y.o.   MRN: [id number]  HPI: Kristi Clark is a 70 y.o. female  Chief Complaint  Patient presents with  . Covid Exposure    Pt thought she exposed to covid but actually wasn't. Covid test came back negative.    COVID test came back negative. She has had no symptoms. She found out that she got her dates wrong, so she was not exposed.   She is feeling better since coming off the prednisone. She is no longer having trouble sleeping. No issues with delusions any more. Feeling much much better  HYPERTENSION Hypertension status: better  Satisfied with current treatment? yes Duration of hypertension: chronic BP monitoring frequency:  rarely BP medication side effects:  no Medication compliance: excellent compliance Aspirin: no Recurrent headaches: no Visual changes: no Palpitations: no Dyspnea: no Chest pain: no Lower extremity edema: no Dizzy/lightheaded: no  Relevant past medical, surgical, family and social history reviewed and updated as indicated. Interim medical history since our last visit reviewed. Allergies and medications reviewed and updated.  Review of Systems  Constitutional: Negative.   Respiratory: Negative.   Cardiovascular: Negative.   Gastrointestinal: Negative.   Musculoskeletal: Negative.   Psychiatric/Behavioral: Negative.     Per HPI unless specifically indicated above     Objective:    BP 122/69   Pulse 77   LMP  (LMP Unknown)   Wt Readings from Last 3 Encounters:  03/02/20 117 lb 1 oz (53.1 kg)  02/25/20 117 lb (53.1 kg)  02/17/20 116 lb 2.9 oz (52.7 kg)    Physical Exam Vitals and nursing note reviewed.  Constitutional:      General: She is not in acute distress.    Appearance: Normal appearance. She is not ill-appearing, toxic-appearing or diaphoretic.  HENT:     Head: Normocephalic and atraumatic.      Right Ear: External ear normal.     Left Ear: External ear normal.     Nose: Nose normal.     Mouth/Throat:     Mouth: Mucous membranes are moist.     Pharynx: Oropharynx is clear.  Eyes:     General: No scleral icterus.       Right eye: No discharge.        Left eye: No discharge.     Conjunctiva/sclera: Conjunctivae normal.     Pupils: Pupils are equal, round, and reactive to light.  Pulmonary:     Effort: Pulmonary effort is normal. No respiratory distress.     Comments: Speaking in full sentences Musculoskeletal:        General: Normal range of motion.     Cervical back: Normal range of motion.  Skin:    Coloration: Skin is not jaundiced or pale.     Findings: No bruising, erythema, lesion or rash.  Neurological:     Mental Status: She is alert and oriented to person, place, and time. Mental status is at baseline.  Psychiatric:        Mood and Affect: Mood normal.        Behavior: Behavior normal.        Thought Content: Thought content normal.        Judgment: Judgment normal.     Results for orders placed or performed in visit on 03/05/20  Novel Coronavirus, NAA ([facility])  Specimen: Nasopharyngeal(NP) swabs in vial transport medium  Result Value Ref Range   SARS-CoV-2, NAA Not Detected Not Detected  SARS-COV-2, NAA 2 DAY TAT  Result Value Ref Range   SARS-CoV-2, NAA 2 DAY TAT Performed       Assessment & Plan:   Problem List Items Addressed This Visit      Cardiovascular and Mediastinum   Benign hypertension with chronic kidney disease    Back to normal off her steroids. Continue current regimen. Continue to monitor.          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 Prednisone listed as allergy. She is aware not to take it again.    Exposure to COVID-19 virus       Asymptomatic. COVID negative. Call with any concerns.        Follow up plan: Return March as  scheduled.   . This visit was completed via MyChart due to the restrictions of the COVID-19 pandemic. All issues as above were discussed and addressed. Physical exam was done as above through visual confirmation on MyChart. If it was felt that the patient should be evaluated in the office, they were directed there. The patient verbally consented to this visit. . Location of the patient: home . Location of the provider: work . Those involved with this call:  . Provider: Olevia Perches, DO . CMA: Rolley Sims, CMA . Front Desk/Registration: Harriet Pho  . Time spent on call: 25 minutes with patient face to face via video conference. More than 50% of this time was spent in counseling and coordination of care. 40 minutes total spent in review of patient's record and preparation of their chart.

## 2020-03-10 DIAGNOSIS — J9621 Acute and chronic respiratory failure with hypoxia: Secondary | ICD-10-CM | POA: Diagnosis not present

## 2020-03-10 DIAGNOSIS — J441 Chronic obstructive pulmonary disease with (acute) exacerbation: Secondary | ICD-10-CM | POA: Diagnosis not present

## 2020-03-10 DIAGNOSIS — N189 Chronic kidney disease, unspecified: Secondary | ICD-10-CM | POA: Diagnosis not present

## 2020-03-10 DIAGNOSIS — I129 Hypertensive chronic kidney disease with stage 1 through stage 4 chronic kidney disease, or unspecified chronic kidney disease: Secondary | ICD-10-CM | POA: Diagnosis not present

## 2020-03-10 DIAGNOSIS — J209 Acute bronchitis, unspecified: Secondary | ICD-10-CM | POA: Diagnosis not present

## 2020-03-10 DIAGNOSIS — J440 Chronic obstructive pulmonary disease with acute lower respiratory infection: Secondary | ICD-10-CM | POA: Diagnosis not present

## 2020-03-12 DIAGNOSIS — J9621 Acute and chronic respiratory failure with hypoxia: Secondary | ICD-10-CM | POA: Diagnosis not present

## 2020-03-12 DIAGNOSIS — I129 Hypertensive chronic kidney disease with stage 1 through stage 4 chronic kidney disease, or unspecified chronic kidney disease: Secondary | ICD-10-CM | POA: Diagnosis not present

## 2020-03-12 DIAGNOSIS — J441 Chronic obstructive pulmonary disease with (acute) exacerbation: Secondary | ICD-10-CM | POA: Diagnosis not present

## 2020-03-12 DIAGNOSIS — J209 Acute bronchitis, unspecified: Secondary | ICD-10-CM | POA: Diagnosis not present

## 2020-03-12 DIAGNOSIS — J440 Chronic obstructive pulmonary disease with acute lower respiratory infection: Secondary | ICD-10-CM | POA: Diagnosis not present

## 2020-03-12 DIAGNOSIS — N189 Chronic kidney disease, unspecified: Secondary | ICD-10-CM | POA: Diagnosis not present

## 2020-03-13 DIAGNOSIS — Z9861 Coronary angioplasty status: Secondary | ICD-10-CM | POA: Diagnosis not present

## 2020-03-13 DIAGNOSIS — E782 Mixed hyperlipidemia: Secondary | ICD-10-CM | POA: Diagnosis not present

## 2020-03-13 DIAGNOSIS — I10 Essential (primary) hypertension: Secondary | ICD-10-CM | POA: Diagnosis not present

## 2020-03-13 DIAGNOSIS — R0602 Shortness of breath: Secondary | ICD-10-CM | POA: Diagnosis not present

## 2020-03-13 DIAGNOSIS — J449 Chronic obstructive pulmonary disease, unspecified: Secondary | ICD-10-CM | POA: Diagnosis not present

## 2020-03-13 DIAGNOSIS — J440 Chronic obstructive pulmonary disease with acute lower respiratory infection: Secondary | ICD-10-CM | POA: Diagnosis not present

## 2020-03-13 DIAGNOSIS — I129 Hypertensive chronic kidney disease with stage 1 through stage 4 chronic kidney disease, or unspecified chronic kidney disease: Secondary | ICD-10-CM | POA: Diagnosis not present

## 2020-03-13 DIAGNOSIS — J441 Chronic obstructive pulmonary disease with (acute) exacerbation: Secondary | ICD-10-CM | POA: Diagnosis not present

## 2020-03-13 DIAGNOSIS — I349 Nonrheumatic mitral valve disorder, unspecified: Secondary | ICD-10-CM | POA: Diagnosis not present

## 2020-03-13 DIAGNOSIS — N189 Chronic kidney disease, unspecified: Secondary | ICD-10-CM | POA: Diagnosis not present

## 2020-03-13 DIAGNOSIS — J209 Acute bronchitis, unspecified: Secondary | ICD-10-CM | POA: Diagnosis not present

## 2020-03-13 DIAGNOSIS — J9621 Acute and chronic respiratory failure with hypoxia: Secondary | ICD-10-CM | POA: Diagnosis not present

## 2020-03-13 DIAGNOSIS — I2510 Atherosclerotic heart disease of native coronary artery without angina pectoris: Secondary | ICD-10-CM | POA: Diagnosis not present

## 2020-03-16 DIAGNOSIS — J441 Chronic obstructive pulmonary disease with (acute) exacerbation: Secondary | ICD-10-CM | POA: Diagnosis not present

## 2020-03-16 DIAGNOSIS — J440 Chronic obstructive pulmonary disease with acute lower respiratory infection: Secondary | ICD-10-CM | POA: Diagnosis not present

## 2020-03-16 DIAGNOSIS — I129 Hypertensive chronic kidney disease with stage 1 through stage 4 chronic kidney disease, or unspecified chronic kidney disease: Secondary | ICD-10-CM | POA: Diagnosis not present

## 2020-03-16 DIAGNOSIS — J9621 Acute and chronic respiratory failure with hypoxia: Secondary | ICD-10-CM | POA: Diagnosis not present

## 2020-03-16 DIAGNOSIS — J209 Acute bronchitis, unspecified: Secondary | ICD-10-CM | POA: Diagnosis not present

## 2020-03-16 DIAGNOSIS — N189 Chronic kidney disease, unspecified: Secondary | ICD-10-CM | POA: Diagnosis not present

## 2020-03-17 DIAGNOSIS — I10 Essential (primary) hypertension: Secondary | ICD-10-CM | POA: Diagnosis not present

## 2020-03-17 DIAGNOSIS — I349 Nonrheumatic mitral valve disorder, unspecified: Secondary | ICD-10-CM | POA: Diagnosis not present

## 2020-03-17 DIAGNOSIS — Z9861 Coronary angioplasty status: Secondary | ICD-10-CM | POA: Diagnosis not present

## 2020-03-17 DIAGNOSIS — R0602 Shortness of breath: Secondary | ICD-10-CM | POA: Diagnosis not present

## 2020-03-17 DIAGNOSIS — J449 Chronic obstructive pulmonary disease, unspecified: Secondary | ICD-10-CM | POA: Diagnosis not present

## 2020-03-17 DIAGNOSIS — E782 Mixed hyperlipidemia: Secondary | ICD-10-CM | POA: Diagnosis not present

## 2020-03-17 DIAGNOSIS — I2510 Atherosclerotic heart disease of native coronary artery without angina pectoris: Secondary | ICD-10-CM | POA: Diagnosis not present

## 2020-03-18 DIAGNOSIS — J441 Chronic obstructive pulmonary disease with (acute) exacerbation: Secondary | ICD-10-CM | POA: Diagnosis not present

## 2020-03-18 DIAGNOSIS — N189 Chronic kidney disease, unspecified: Secondary | ICD-10-CM | POA: Diagnosis not present

## 2020-03-18 DIAGNOSIS — I129 Hypertensive chronic kidney disease with stage 1 through stage 4 chronic kidney disease, or unspecified chronic kidney disease: Secondary | ICD-10-CM | POA: Diagnosis not present

## 2020-03-18 DIAGNOSIS — J440 Chronic obstructive pulmonary disease with acute lower respiratory infection: Secondary | ICD-10-CM | POA: Diagnosis not present

## 2020-03-18 DIAGNOSIS — J209 Acute bronchitis, unspecified: Secondary | ICD-10-CM | POA: Diagnosis not present

## 2020-03-18 DIAGNOSIS — J9621 Acute and chronic respiratory failure with hypoxia: Secondary | ICD-10-CM | POA: Diagnosis not present

## 2020-03-21 DIAGNOSIS — N189 Chronic kidney disease, unspecified: Secondary | ICD-10-CM | POA: Diagnosis not present

## 2020-03-21 DIAGNOSIS — J441 Chronic obstructive pulmonary disease with (acute) exacerbation: Secondary | ICD-10-CM | POA: Diagnosis not present

## 2020-03-21 DIAGNOSIS — I129 Hypertensive chronic kidney disease with stage 1 through stage 4 chronic kidney disease, or unspecified chronic kidney disease: Secondary | ICD-10-CM | POA: Diagnosis not present

## 2020-03-21 DIAGNOSIS — J209 Acute bronchitis, unspecified: Secondary | ICD-10-CM | POA: Diagnosis not present

## 2020-03-21 DIAGNOSIS — J440 Chronic obstructive pulmonary disease with acute lower respiratory infection: Secondary | ICD-10-CM | POA: Diagnosis not present

## 2020-03-21 DIAGNOSIS — J9621 Acute and chronic respiratory failure with hypoxia: Secondary | ICD-10-CM | POA: Diagnosis not present

## 2020-03-22 DIAGNOSIS — K219 Gastro-esophageal reflux disease without esophagitis: Secondary | ICD-10-CM | POA: Diagnosis not present

## 2020-03-22 DIAGNOSIS — Z7902 Long term (current) use of antithrombotics/antiplatelets: Secondary | ICD-10-CM | POA: Diagnosis not present

## 2020-03-22 DIAGNOSIS — J441 Chronic obstructive pulmonary disease with (acute) exacerbation: Secondary | ICD-10-CM | POA: Diagnosis not present

## 2020-03-22 DIAGNOSIS — E876 Hypokalemia: Secondary | ICD-10-CM | POA: Diagnosis not present

## 2020-03-22 DIAGNOSIS — I129 Hypertensive chronic kidney disease with stage 1 through stage 4 chronic kidney disease, or unspecified chronic kidney disease: Secondary | ICD-10-CM | POA: Diagnosis not present

## 2020-03-22 DIAGNOSIS — F32A Depression, unspecified: Secondary | ICD-10-CM | POA: Diagnosis not present

## 2020-03-22 DIAGNOSIS — M8580 Other specified disorders of bone density and structure, unspecified site: Secondary | ICD-10-CM | POA: Diagnosis not present

## 2020-03-22 DIAGNOSIS — F17210 Nicotine dependence, cigarettes, uncomplicated: Secondary | ICD-10-CM | POA: Diagnosis not present

## 2020-03-22 DIAGNOSIS — Z9181 History of falling: Secondary | ICD-10-CM | POA: Diagnosis not present

## 2020-03-22 DIAGNOSIS — Z79899 Other long term (current) drug therapy: Secondary | ICD-10-CM | POA: Diagnosis not present

## 2020-03-22 DIAGNOSIS — Z90710 Acquired absence of both cervix and uterus: Secondary | ICD-10-CM | POA: Diagnosis not present

## 2020-03-22 DIAGNOSIS — Z9981 Dependence on supplemental oxygen: Secondary | ICD-10-CM | POA: Diagnosis not present

## 2020-03-22 DIAGNOSIS — J440 Chronic obstructive pulmonary disease with acute lower respiratory infection: Secondary | ICD-10-CM | POA: Diagnosis not present

## 2020-03-22 DIAGNOSIS — J209 Acute bronchitis, unspecified: Secondary | ICD-10-CM | POA: Diagnosis not present

## 2020-03-22 DIAGNOSIS — E785 Hyperlipidemia, unspecified: Secondary | ICD-10-CM | POA: Diagnosis not present

## 2020-03-22 DIAGNOSIS — E871 Hypo-osmolality and hyponatremia: Secondary | ICD-10-CM | POA: Diagnosis not present

## 2020-03-22 DIAGNOSIS — I2510 Atherosclerotic heart disease of native coronary artery without angina pectoris: Secondary | ICD-10-CM | POA: Diagnosis not present

## 2020-03-22 DIAGNOSIS — D631 Anemia in chronic kidney disease: Secondary | ICD-10-CM | POA: Diagnosis not present

## 2020-03-22 DIAGNOSIS — J9621 Acute and chronic respiratory failure with hypoxia: Secondary | ICD-10-CM | POA: Diagnosis not present

## 2020-03-22 DIAGNOSIS — Z9049 Acquired absence of other specified parts of digestive tract: Secondary | ICD-10-CM | POA: Diagnosis not present

## 2020-03-22 DIAGNOSIS — N189 Chronic kidney disease, unspecified: Secondary | ICD-10-CM | POA: Diagnosis not present

## 2020-03-22 DIAGNOSIS — Z7952 Long term (current) use of systemic steroids: Secondary | ICD-10-CM | POA: Diagnosis not present

## 2020-03-22 DIAGNOSIS — Z7982 Long term (current) use of aspirin: Secondary | ICD-10-CM | POA: Diagnosis not present

## 2020-03-24 DIAGNOSIS — I129 Hypertensive chronic kidney disease with stage 1 through stage 4 chronic kidney disease, or unspecified chronic kidney disease: Secondary | ICD-10-CM | POA: Diagnosis not present

## 2020-03-24 DIAGNOSIS — J209 Acute bronchitis, unspecified: Secondary | ICD-10-CM | POA: Diagnosis not present

## 2020-03-24 DIAGNOSIS — J9621 Acute and chronic respiratory failure with hypoxia: Secondary | ICD-10-CM | POA: Diagnosis not present

## 2020-03-24 DIAGNOSIS — J441 Chronic obstructive pulmonary disease with (acute) exacerbation: Secondary | ICD-10-CM | POA: Diagnosis not present

## 2020-03-24 DIAGNOSIS — J440 Chronic obstructive pulmonary disease with acute lower respiratory infection: Secondary | ICD-10-CM | POA: Diagnosis not present

## 2020-03-24 DIAGNOSIS — N189 Chronic kidney disease, unspecified: Secondary | ICD-10-CM | POA: Diagnosis not present

## 2020-03-26 ENCOUNTER — Telehealth: Payer: Self-pay | Admitting: *Deleted

## 2020-03-26 NOTE — Telephone Encounter (Signed)
Contacted patient to inquire about lcs nodule follow up scan that is past due. Previous CT chest ordered by pulmonary was not done in December. Patient reports recent COPD flare with hospitalization, smoking cessation, and Pulmonologist plan to wait to do follow up scan in Aug. 22'. Patient knows to call me for any needs.

## 2020-03-31 DIAGNOSIS — J441 Chronic obstructive pulmonary disease with (acute) exacerbation: Secondary | ICD-10-CM | POA: Diagnosis not present

## 2020-03-31 DIAGNOSIS — I129 Hypertensive chronic kidney disease with stage 1 through stage 4 chronic kidney disease, or unspecified chronic kidney disease: Secondary | ICD-10-CM | POA: Diagnosis not present

## 2020-03-31 DIAGNOSIS — J9621 Acute and chronic respiratory failure with hypoxia: Secondary | ICD-10-CM | POA: Diagnosis not present

## 2020-03-31 DIAGNOSIS — J440 Chronic obstructive pulmonary disease with acute lower respiratory infection: Secondary | ICD-10-CM | POA: Diagnosis not present

## 2020-03-31 DIAGNOSIS — J209 Acute bronchitis, unspecified: Secondary | ICD-10-CM | POA: Diagnosis not present

## 2020-03-31 DIAGNOSIS — H259 Unspecified age-related cataract: Secondary | ICD-10-CM | POA: Diagnosis not present

## 2020-03-31 DIAGNOSIS — N189 Chronic kidney disease, unspecified: Secondary | ICD-10-CM | POA: Diagnosis not present

## 2020-04-02 DIAGNOSIS — J441 Chronic obstructive pulmonary disease with (acute) exacerbation: Secondary | ICD-10-CM | POA: Diagnosis not present

## 2020-04-02 DIAGNOSIS — J440 Chronic obstructive pulmonary disease with acute lower respiratory infection: Secondary | ICD-10-CM | POA: Diagnosis not present

## 2020-04-02 DIAGNOSIS — J9621 Acute and chronic respiratory failure with hypoxia: Secondary | ICD-10-CM | POA: Diagnosis not present

## 2020-04-02 DIAGNOSIS — N189 Chronic kidney disease, unspecified: Secondary | ICD-10-CM | POA: Diagnosis not present

## 2020-04-02 DIAGNOSIS — J209 Acute bronchitis, unspecified: Secondary | ICD-10-CM | POA: Diagnosis not present

## 2020-04-02 DIAGNOSIS — I129 Hypertensive chronic kidney disease with stage 1 through stage 4 chronic kidney disease, or unspecified chronic kidney disease: Secondary | ICD-10-CM | POA: Diagnosis not present

## 2020-04-02 DIAGNOSIS — J8489 Other specified interstitial pulmonary diseases: Secondary | ICD-10-CM | POA: Diagnosis not present

## 2020-04-03 ENCOUNTER — Other Ambulatory Visit: Payer: Self-pay | Admitting: Specialist

## 2020-04-03 DIAGNOSIS — J849 Interstitial pulmonary disease, unspecified: Secondary | ICD-10-CM

## 2020-04-08 DIAGNOSIS — J441 Chronic obstructive pulmonary disease with (acute) exacerbation: Secondary | ICD-10-CM | POA: Diagnosis not present

## 2020-04-08 DIAGNOSIS — J440 Chronic obstructive pulmonary disease with acute lower respiratory infection: Secondary | ICD-10-CM | POA: Diagnosis not present

## 2020-04-08 DIAGNOSIS — J9621 Acute and chronic respiratory failure with hypoxia: Secondary | ICD-10-CM | POA: Diagnosis not present

## 2020-04-08 DIAGNOSIS — I129 Hypertensive chronic kidney disease with stage 1 through stage 4 chronic kidney disease, or unspecified chronic kidney disease: Secondary | ICD-10-CM | POA: Diagnosis not present

## 2020-04-08 DIAGNOSIS — N189 Chronic kidney disease, unspecified: Secondary | ICD-10-CM | POA: Diagnosis not present

## 2020-04-08 DIAGNOSIS — J209 Acute bronchitis, unspecified: Secondary | ICD-10-CM | POA: Diagnosis not present

## 2020-04-11 ENCOUNTER — Encounter: Payer: Self-pay | Admitting: Family Medicine

## 2020-04-12 ENCOUNTER — Encounter: Payer: Self-pay | Admitting: Family Medicine

## 2020-04-19 ENCOUNTER — Encounter: Payer: Self-pay | Admitting: Family Medicine

## 2020-04-19 MED ORDER — ALBUTEROL SULFATE HFA 108 (90 BASE) MCG/ACT IN AERS
2.0000 | INHALATION_SPRAY | Freq: Four times a day (QID) | RESPIRATORY_TRACT | 0 refills | Status: DC | PRN
Start: 2020-04-19 — End: 2020-04-20

## 2020-04-19 MED ORDER — ATORVASTATIN CALCIUM 40 MG PO TABS: 40.0000 mg | ORAL_TABLET | Freq: Every day | ORAL | 0 refills | Status: DC

## 2020-04-19 MED ORDER — PANTOPRAZOLE SODIUM 40 MG PO TBEC: 40.0000 mg | DELAYED_RELEASE_TABLET | Freq: Every day | ORAL | 0 refills | Status: DC

## 2020-04-19 MED ORDER — METOPROLOL SUCCINATE ER 50 MG PO TB24: 50.0000 mg | ORAL_TABLET | Freq: Every day | ORAL | 0 refills | Status: DC

## 2020-04-19 MED ORDER — FLUTICASONE-SALMETEROL 250-50 MCG/DOSE IN AEPB
1.0000 | INHALATION_SPRAY | Freq: Two times a day (BID) | RESPIRATORY_TRACT | 0 refills | Status: DC
Start: 2020-04-19 — End: 2020-04-20

## 2020-04-19 MED ORDER — HYDROCHLOROTHIAZIDE 25 MG PO TABS: 25.0000 mg | ORAL_TABLET | Freq: Every day | ORAL | 0 refills | Status: DC

## 2020-04-19 MED ORDER — ISOSORBIDE MONONITRATE ER 30 MG PO TB24: 30.0000 mg | ORAL_TABLET | Freq: Every day | ORAL | 0 refills | Status: DC

## 2020-04-19 MED ORDER — CLOPIDOGREL BISULFATE 75 MG PO TABS: 75.0000 mg | ORAL_TABLET | Freq: Every day | ORAL | 0 refills | Status: DC

## 2020-04-20 ENCOUNTER — Other Ambulatory Visit: Payer: Self-pay

## 2020-04-20 MED ORDER — HYDROCHLOROTHIAZIDE 25 MG PO TABS: 25.0000 mg | ORAL_TABLET | Freq: Every day | ORAL | 0 refills | Status: DC

## 2020-04-20 MED ORDER — ALBUTEROL SULFATE HFA 108 (90 BASE) MCG/ACT IN AERS: 2.0000 | INHALATION_SPRAY | Freq: Four times a day (QID) | RESPIRATORY_TRACT | 0 refills | Status: DC | PRN

## 2020-04-20 MED ORDER — LOSARTAN POTASSIUM 50 MG PO TABS: 50.0000 mg | ORAL_TABLET | Freq: Every day | ORAL | 1 refills | Status: DC

## 2020-04-20 MED ORDER — FLUTICASONE-SALMETEROL 250-50 MCG/DOSE IN AEPB: 1.0000 | INHALATION_SPRAY | Freq: Two times a day (BID) | RESPIRATORY_TRACT | 0 refills | Status: DC

## 2020-04-20 MED ORDER — ISOSORBIDE MONONITRATE ER 30 MG PO TB24: 30.0000 mg | ORAL_TABLET | Freq: Every day | ORAL | 0 refills | Status: DC

## 2020-04-20 MED ORDER — PANTOPRAZOLE SODIUM 40 MG PO TBEC: 40.0000 mg | DELAYED_RELEASE_TABLET | Freq: Every day | ORAL | 0 refills | Status: DC

## 2020-04-20 MED ORDER — METOPROLOL SUCCINATE ER 50 MG PO TB24: 50.0000 mg | ORAL_TABLET | Freq: Every day | ORAL | 0 refills | Status: DC

## 2020-04-20 MED ORDER — CLOPIDOGREL BISULFATE 75 MG PO TABS: 75.0000 mg | ORAL_TABLET | Freq: Every day | ORAL | 0 refills | Status: DC

## 2020-04-20 MED ORDER — TIOTROPIUM BROMIDE MONOHYDRATE 18 MCG IN CAPS: 18.0000 ug | ORAL_CAPSULE | Freq: Every day | RESPIRATORY_TRACT | 3 refills | Status: DC

## 2020-04-20 MED ORDER — ATORVASTATIN CALCIUM 40 MG PO TABS: 40.0000 mg | ORAL_TABLET | Freq: Every day | ORAL | 0 refills | Status: DC

## 2020-04-20 NOTE — Telephone Encounter (Signed)
Needs medications sent to [facility] mail order instead of local pharmacy.

## 2020-04-27 ENCOUNTER — Encounter: Payer: Self-pay | Admitting: Family Medicine

## 2020-04-28 DIAGNOSIS — J449 Chronic obstructive pulmonary disease, unspecified: Secondary | ICD-10-CM | POA: Diagnosis not present

## 2020-04-28 DIAGNOSIS — I2510 Atherosclerotic heart disease of native coronary artery without angina pectoris: Secondary | ICD-10-CM | POA: Diagnosis not present

## 2020-04-28 DIAGNOSIS — E782 Mixed hyperlipidemia: Secondary | ICD-10-CM | POA: Diagnosis not present

## 2020-04-28 DIAGNOSIS — I10 Essential (primary) hypertension: Secondary | ICD-10-CM | POA: Diagnosis not present

## 2020-04-28 DIAGNOSIS — Z9861 Coronary angioplasty status: Secondary | ICD-10-CM | POA: Diagnosis not present

## 2020-04-28 DIAGNOSIS — F17210 Nicotine dependence, cigarettes, uncomplicated: Secondary | ICD-10-CM | POA: Diagnosis not present

## 2020-05-11 ENCOUNTER — Other Ambulatory Visit: Payer: Self-pay

## 2020-05-11 ENCOUNTER — Ambulatory Visit
Admission: EM | Admit: 2020-05-11 | Discharge: 2020-05-11 | Disposition: A | Discharge status: Home / Self Care | Payer: Medicare Other | Attending: Family Medicine | Admitting: Family Medicine

## 2020-05-11 DIAGNOSIS — R059 Cough, unspecified: Secondary | ICD-10-CM | POA: Diagnosis not present

## 2020-05-11 MED ORDER — AZITHROMYCIN 250 MG PO TABS: 250.0000 mg | ORAL_TABLET | Freq: Every day | ORAL | 0 refills | Status: DC

## 2020-05-11 NOTE — ED Triage Notes (Signed)
Pt presents today with c/o of nasal congestion and cough. Reports low grade temp. +body aches x 1 week

## 2020-05-11 NOTE — ED Provider Notes (Signed)
Roderic Palau    CSN: [id number] Arrival date & time: 05/11/20  1025      History   Chief Complaint Chief Complaint  Patient presents with  . Nasal Congestion  . Cough    HPI Kristi Clark is a 70 y.o. female.   Patient is a 70 year old female past medical history of COPD, depression, GERD, hyperlipidemia, hypertension, tobacco abuse.  She presents today with complaints of nasal congestion, productive cough.  Is been present for the past week.  She has had low-grade fever and body aches.  Coughing up green mucus.  No chest pain or shortness of breath.  Recently traveled from [place]     Past Medical History:  Diagnosis Date  . COPD (chronic obstructive pulmonary disease) ([place])   . Depression   . GERD (gastroesophageal reflux disease)   . Hyperlipidemia   . Hypertension   . Menopause   . Osteopenia   . Tobacco abuse     Patient Active Problem List   Diagnosis Date Noted  . COPD with acute exacerbation ([place]) 02/17/2020  . Hyponatremia 02/17/2020  . Hypokalemia 02/17/2020  . Respiratory failure, acute ([place]) 02/17/2020  . Nicotine dependence 02/17/2020  . Postherpetic neuralgia 11/24/2019  . Tobacco abuse 10/17/2017  . IBS (irritable bowel syndrome) 04/04/2017  . Advanced care planning/counseling discussion 10/03/2016  . CAD (coronary artery disease) 10/03/2016  . History of anemia 10/03/2016  . Benign hypertension with chronic kidney disease 03/06/2015  . Allergic rhinitis 09/03/2014  . 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 GERD (gastroesophageal reflux disease) 09/03/2014  . Hyperlipidemia 09/03/2014  . Anemia 07/14/2014  . COPD, severe ([place]) 08/08/2013  . Pulmonary nodules 08/08/2013    Past Surgical History:  Procedure Laterality Date  . ABDOMINAL HYSTERECTOMY    . CARDIAC CATHETERIZATION    . CHOLECYSTECTOMY  2014  . COLONOSCOPY WITH PROPOFOL N/A 01/16/2018   Procedure: COLONOSCOPY WITH PROPOFOL;  Surgeon: Lollie Sails, MD;  Location: [facility] ENDOSCOPY;  Service: Endoscopy;  Laterality: N/A;  . ESOPHAGOGASTRODUODENOSCOPY (EGD) WITH PROPOFOL N/A 01/16/2018   Procedure: ESOPHAGOGASTRODUODENOSCOPY (EGD) WITH PROPOFOL;  Surgeon: Lollie Sails, MD;  Location: [facility] ENDOSCOPY;  Service: Endoscopy;  Laterality: N/A;  . heart stent      OB History   No obstetric history on file.      Home Medications    Prior to Admission medications   Medication Sig Start Date End Date Taking? Authorizing Provider  albuterol (VENTOLIN HFA) 108 (90 Base) MCG/ACT inhaler Inhale 2 puffs into the lungs every 6 (six) hours as needed for wheezing or shortness of breath. 04/20/20  Yes Johnson, Megan P, DO  aspirin 81 MG tablet Take 81 mg by mouth daily.   Yes [provider]  atorvastatin (LIPITOR) 40 MG tablet Take 1 tablet (40 mg total) by mouth daily. 04/20/20  Yes Johnson, Megan P, DO  azithromycin (ZITHROMAX) 250 MG tablet Take 1 tablet (250 mg total) by mouth daily. Take first 2 tablets together, then 1 every day until finished. 05/11/20  Yes Gearlene Godsil A, NP  clopidogrel (PLAVIX) 75 MG tablet Take 1 tablet (75 mg total) by mouth daily. 04/20/20  Yes Johnson, Megan P, DO  fluticasone (FLONASE) 50 MCG/ACT nasal spray Place 2 sprays into both nostrils daily. 10/21/19  Yes Volney American, PA-C  Fluticasone-Salmeterol (ADVAIR) 250-50 MCG/DOSE AEPB Inhale 1 puff into the lungs 2 (two) times daily. 04/20/20  Yes Johnson, Megan P, DO  hydrochlorothiazide (HYDRODIURIL) 25 MG tablet  Take 1 tablet (25 mg total) by mouth daily. 04/20/20  Yes Johnson, Megan P, DO  isosorbide mononitrate (IMDUR) 30 MG 24 hr tablet Take 1 tablet (30 mg total) by mouth daily. 04/20/20  Yes Johnson, Megan P, DO  losartan (COZAAR) 50 MG tablet Take 1 tablet (50 mg total) by mouth daily. 04/20/20  Yes Johnson, Megan P, DO  metoprolol succinate (TOPROL-XL) 50 MG 24 hr tablet Take 1 tablet (50 mg total) by mouth daily. Take with or immediately  following a meal. 04/20/20  Yes Johnson, Megan P, DO  pantoprazole (PROTONIX) 40 MG tablet Take 1 tablet (40 mg total) by mouth daily. 04/20/20  Yes Johnson, Megan P, DO  tiotropium (SPIRIVA) 18 MCG inhalation capsule Place 1 capsule (18 mcg total) into inhaler and inhale daily. 04/20/20  Yes Johnson, Megan P, DO  Calcium Carbonate (CALCIUM 600 PO) Take by mouth daily.    [provider]  Cetirizine HCl (ZYRTEC PO) Take by mouth daily.    [provider]  fluconazole (DIFLUCAN) 150 MG tablet Take 1 tablet (150 mg total) by mouth daily. May repeat after 24 hours if not better 02/25/20   Park Liter P, DO  Multiple Vitamins-Minerals (CENTRUM SILVER ADULT 50+ PO) Take 1 tablet by mouth daily.    [provider]  nortriptyline (PAMELOR) 10 MG capsule Take 1 capsule (10 mg total) by mouth at bedtime. Patient not taking: No sig reported 11/18/19   [place] Liter P, DO  ondansetron (ZOFRAN ODT) 4 MG disintegrating tablet Take 1 tablet (4 mg total) by mouth every 8 (eight) hours as needed for nausea or vomiting. Patient not taking: No sig reported 11/08/19   Park Liter P, DO  Probiotic Product (PROBIOTIC-10 PO) Take by mouth. Every other day    [provider]    Family History Family History  Problem Relation Age of Onset  . Cancer Mother   . Stroke Mother   . Heart disease Father   . Hyperlipidemia Father   . Cancer Sister   . Diabetes Sister   . Breast cancer Sister 20  . Diabetes Brother   . Asthma Son   . Cancer Son   . Diabetes Daughter   . Hypertension Daughter   . Cancer Maternal Grandmother        gallbladder  . Diabetes Brother   . Heart disease Brother   . Breast cancer Paternal Aunt     Social History Social History   Tobacco Use  . Smoking status: Current Every Day Smoker    Packs/day: 2.00    Years: 56.00    Pack years: 112.00    Types: Cigarettes  . Smokeless tobacco: Never Used  . Tobacco comment: has the gum and patch.    Vaping Use  . Vaping Use: Never used  Substance Use Topics  . Alcohol use: No  . Drug use: No     Allergies   Clindamycin/lincomycin, Prednisone, Amoxicillin, Avelox [moxifloxacin hcl in nacl], Codeine sulfate, Penicillins, and Sulfa antibiotics   Review of Systems Review of Systems   Physical Exam Triage Vital Signs ED Triage Vitals  Enc Vitals Group     BP 05/11/20 1104 (!) 109/57     Pulse Rate 05/11/20 1104 93     Resp 05/11/20 1104 18     Temp 05/11/20 1104 98.2 F (36.8 C)     Temp Source 05/11/20 1104 Oral     SpO2 05/11/20 1104 93 %     Weight 05/11/20 1111  115 lb (52.2 kg)     Height --      Head Circumference --      Peak Flow --      Pain Score 05/11/20 1108 3     Pain Loc --      Pain Edu? --      Excl. in [place]? --    No data found.  Updated Vital Signs BP (!) 109/57 (BP Location: Left Arm)   Pulse 93   Temp 98.2 F (36.8 C) (Oral)   Resp 18   Wt 115 lb (52.2 kg)   LMP  (LMP Unknown)   SpO2 93%   BMI 23.23 kg/m   Visual Acuity Right Eye Distance:   Left Eye Distance:   Bilateral Distance:    Right Eye Near:   Left Eye Near:    Bilateral Near:     Physical Exam Vitals and nursing note reviewed.  Constitutional:      General: She is not in acute distress.    Appearance: Normal appearance. She is not ill-appearing, toxic-appearing or diaphoretic.  HENT:     Head: Normocephalic.     Right Ear: Tympanic membrane and ear canal normal.     Left Ear: Tympanic membrane and ear canal normal.     Nose: Nose normal.     Mouth/Throat:     Pharynx: Oropharynx is clear.  Eyes:     Conjunctiva/sclera: Conjunctivae normal.  Cardiovascular:     Rate and Rhythm: Normal rate and regular rhythm.  Pulmonary:     Effort: Pulmonary effort is normal.     Comments: Decreased in bases   Musculoskeletal:        General: Normal range of motion.     Cervical back: Normal range of motion.  Skin:    General: Skin is warm and dry.     Findings: No rash.   Neurological:     Mental Status: She is alert.  Psychiatric:        Mood and Affect: Mood normal.      UC Treatments / Results  Labs (all labs ordered are listed, but only abnormal results are displayed) Labs Reviewed - No data to display  EKG   Radiology No results found.  Procedures Procedures (including critical care time)  Medications Ordered in UC Medications - No data to display  Initial Impression / Assessment and Plan / UC Course  I have reviewed the triage vital signs and the nursing notes.  Pertinent labs & imaging results that were available during my care of the patient were reviewed by me and considered in my medical decision making (see chart for details).     Cough No specific concerns on exam.  We will go ahead and treat for bacterial infection at this time based on with the symptoms and worsening overnight with fever and body aches. Treating with azithromycin.  Recommended continue Delsym as needed. Follow up as needed for continued or worsening symptoms  Final Clinical Impressions(s) / UC Diagnoses   Final diagnoses:  Cough     Discharge Instructions     Take the antibiotics as prescribed Continue the delsym as needed.  Follow up as needed for continued or worsening symptoms     ED Prescriptions    Medication Sig Dispense Auth. Provider   azithromycin (ZITHROMAX) 250 MG tablet Take 1 tablet (250 mg total) by mouth daily. Take first 2 tablets together, then 1 every day until finished. 6 tablet Orvan July, NP  PDMP not reviewed this encounter.   Orvan July, NP 05/11/20 1302

## 2020-05-11 NOTE — Discharge Instructions (Addendum)
Take the antibiotics as prescribed Continue the delsym as needed.  Follow up as needed for continued or worsening symptoms

## 2020-05-22 ENCOUNTER — Ambulatory Visit: Admitting: Family Medicine

## 2020-05-27 DIAGNOSIS — I700 Atherosclerosis of aorta: Secondary | ICD-10-CM | POA: Insufficient documentation

## 2020-05-29 ENCOUNTER — Ambulatory Visit (INDEPENDENT_AMBULATORY_CARE_PROVIDER_SITE_OTHER): Payer: Medicare Other | Admitting: Nurse Practitioner

## 2020-05-29 ENCOUNTER — Ambulatory Visit: Payer: Medicare Other

## 2020-05-29 ENCOUNTER — Other Ambulatory Visit: Payer: Self-pay

## 2020-05-29 ENCOUNTER — Encounter: Payer: Self-pay | Admitting: Nurse Practitioner

## 2020-05-29 ENCOUNTER — Ambulatory Visit (INDEPENDENT_AMBULATORY_CARE_PROVIDER_SITE_OTHER): Payer: Medicare Other

## 2020-05-29 VITALS — BP 132/58 | HR 84 | Temp 98.6°F | Wt 114.5 lb

## 2020-05-29 VITALS — Ht 59.0 in | Wt 114.0 lb

## 2020-05-29 DIAGNOSIS — M25512 Pain in left shoulder: Secondary | ICD-10-CM | POA: Diagnosis not present

## 2020-05-29 DIAGNOSIS — K219 Gastro-esophageal reflux disease without esophagitis: Secondary | ICD-10-CM | POA: Diagnosis not present

## 2020-05-29 DIAGNOSIS — Z720 Tobacco use: Secondary | ICD-10-CM | POA: Diagnosis not present

## 2020-05-29 DIAGNOSIS — J449 Chronic obstructive pulmonary disease, unspecified: Secondary | ICD-10-CM

## 2020-05-29 DIAGNOSIS — I700 Atherosclerosis of aorta: Secondary | ICD-10-CM

## 2020-05-29 DIAGNOSIS — D649 Anemia, unspecified: Secondary | ICD-10-CM | POA: Diagnosis not present

## 2020-05-29 DIAGNOSIS — M25511 Pain in right shoulder: Secondary | ICD-10-CM | POA: Diagnosis not present

## 2020-05-29 DIAGNOSIS — Z79899 Other long term (current) drug therapy: Secondary | ICD-10-CM | POA: Diagnosis not present

## 2020-05-29 DIAGNOSIS — I129 Hypertensive chronic kidney disease with stage 1 through stage 4 chronic kidney disease, or unspecified chronic kidney disease: Secondary | ICD-10-CM | POA: Diagnosis not present

## 2020-05-29 DIAGNOSIS — E785 Hyperlipidemia, unspecified: Secondary | ICD-10-CM

## 2020-05-29 DIAGNOSIS — Z0000 Encounter for general adult medical examination without abnormal findings: Secondary | ICD-10-CM | POA: Diagnosis not present

## 2020-05-29 DIAGNOSIS — R252 Cramp and spasm: Secondary | ICD-10-CM

## 2020-05-29 NOTE — Assessment & Plan Note (Signed)
Chronic, stable. BP 132/58 today. Continue current regimen. Will check CMP and CBC.

## 2020-05-29 NOTE — Assessment & Plan Note (Signed)
Chronic. Iron studies in 11/2019 were normal. With hand/feet cramping will check magnesium, vitamin b12 and folate. Will await results

## 2020-05-29 NOTE — Progress Notes (Signed)
I connected with Kristi Clark today by telephone and verified that I am speaking with the correct person using two identifiers. Location patient: home Location provider: work Persons participating in the virtual visit: Lachandra, Dettmann LPN.   I discussed the limitations, risks, security and privacy concerns of performing an evaluation and management service by telephone and the availability of in person appointments. I also discussed with the patient that there may be a patient responsible charge related to this service. The patient expressed understanding and verbally consented to this telephonic visit.    Interactive audio and video telecommunications were attempted between this provider and patient, however failed, due to patient having technical difficulties OR patient did not have access to video capability.  We continued and completed visit with audio only.     Vital signs may be patient reported or missing.  Subjective:   Kristi Clark is a 70 y.o. female who presents for Medicare Annual (Subsequent) preventive examination.  Review of Systems     Cardiac Risk Factors include: advanced age (>72men, >35 women);hypertension;sedentary lifestyle;smoking/ tobacco exposure     Objective:    Today's Vitals   05/29/20 1341  Weight: 114 lb (51.7 kg)  Height: 4\' 11"  (1.499 m)   Body mass index is 23.03 kg/m.  Advanced Directives 05/29/2020 03/02/2020 02/17/2020 02/17/2020 02/17/2020 11/04/2019 05/16/2019  Does Patient Have a Medical Advance Directive? Yes No Yes No No No No  Type of Paramedic of [place];Living will - [place]  Does patient want to make changes to medical advance directive? - No - Patient declined Yes (ED - Information included in AVS) - - - -  Copy of [place] in Chart? No - copy requested - - - - - -  Would patient like information on creating a medical advance directive?  - No - Patient declined No - Patient declined - No - Patient declined No - Patient declined Yes (MAU/Ambulatory/Procedural Areas - Information given)    Current Medications (verified) Outpatient Encounter Medications as of 05/29/2020  Medication Sig  . albuterol (VENTOLIN HFA) 108 (90 Base) MCG/ACT inhaler Inhale 2 puffs into the lungs every 6 (six) hours as needed for wheezing or shortness of breath.  Marland Kitchen aspirin 81 MG tablet Take 81 mg by mouth daily.  Marland Kitchen atorvastatin (LIPITOR) 40 MG tablet Take 1 tablet (40 mg total) by mouth daily.  . Calcium Carbonate (CALCIUM 600 PO) Take by mouth daily.  . Cetirizine HCl (ZYRTEC PO) Take by mouth daily.  . clopidogrel (PLAVIX) 75 MG tablet Take 1 tablet (75 mg total) by mouth daily.  . fluticasone (FLONASE) 50 MCG/ACT nasal spray Place 2 sprays into both nostrils daily.  . Fluticasone-Salmeterol (ADVAIR) 250-50 MCG/DOSE AEPB Inhale 1 puff into the lungs 2 (two) times daily.  . hydrochlorothiazide (HYDRODIURIL) 25 MG tablet Take 1 tablet (25 mg total) by mouth daily.  . isosorbide mononitrate (IMDUR) 30 MG 24 hr tablet Take 1 tablet (30 mg total) by mouth daily.  Marland Kitchen losartan (COZAAR) 50 MG tablet Take 1 tablet (50 mg total) by mouth daily.  . metoprolol succinate (TOPROL-XL) 50 MG 24 hr tablet Take 1 tablet (50 mg total) by mouth daily. Take with or immediately following a meal.  . Multiple Vitamins-Minerals (CENTRUM SILVER ADULT 50+ PO) Take 1 tablet by mouth daily.  . pantoprazole (PROTONIX) 40 MG tablet Take 1 tablet (40 mg total) by mouth daily.  . Probiotic Product (PROBIOTIC-10  PO) Take by mouth. Every other day  . tiotropium (SPIRIVA) 18 MCG inhalation capsule Place 1 capsule (18 mcg total) into inhaler and inhale daily.   No facility-administered encounter medications on file as of 05/29/2020.    Allergies (verified) Clindamycin/lincomycin, Prednisone, Amoxicillin, Avelox [moxifloxacin hcl in nacl], Codeine sulfate, Penicillins, and Sulfa  antibiotics   History: Past Medical History:  Diagnosis Date  . COPD (chronic obstructive pulmonary disease) ([place])   . Depression   . GERD (gastroesophageal reflux disease)   . Hyperlipidemia   . Hypertension   . Menopause   . Osteopenia   . Tobacco abuse    Past Surgical History:  Procedure Laterality Date  . ABDOMINAL HYSTERECTOMY    . CARDIAC CATHETERIZATION    . CHOLECYSTECTOMY  2014  . COLONOSCOPY WITH PROPOFOL N/A 01/16/2018   Procedure: COLONOSCOPY WITH PROPOFOL;  Surgeon: Lollie Sails, MD;  Location: [facility] ENDOSCOPY;  Service: Endoscopy;  Laterality: N/A;  . ESOPHAGOGASTRODUODENOSCOPY (EGD) WITH PROPOFOL N/A 01/16/2018   Procedure: ESOPHAGOGASTRODUODENOSCOPY (EGD) WITH PROPOFOL;  Surgeon: Lollie Sails, MD;  Location: [facility] ENDOSCOPY;  Service: Endoscopy;  Laterality: N/A;  . heart stent     Family History  Problem Relation Age of Onset  . Cancer Mother   . Stroke Mother   . Heart disease Father   . Hyperlipidemia Father   . Cancer Sister   . Diabetes Sister   . Breast cancer Sister 19  . Diabetes Brother   . Asthma Son   . Cancer Son   . Diabetes Daughter   . Hypertension Daughter   . Cancer Maternal Grandmother        gallbladder  . Diabetes Brother   . Heart disease Brother   . Breast cancer Paternal Aunt    Social History   Socioeconomic History  . Marital status: Widowed    Spouse name: Not on file  . Number of children: Not on file  . Years of education: Not on file  . Highest education level: 10th grade  Occupational History  . Occupation: retired  Tobacco Use  . Smoking status: Current Every Day Smoker    Packs/day: 1.50    Years: 56.00    Pack years: 84.00    Types: Cigarettes  . Smokeless tobacco: Never Used  . Tobacco comment: has the gum and patch.   Vaping Use  . Vaping Use: Never used  Substance and Sexual Activity  . Alcohol use: No  . Drug use: No  . Sexual activity: Never  Other Topics Concern  . Not on file   Social History Narrative  . Not on file   Social Determinants of Health   Financial Resource Strain: Low Risk   . Difficulty of Paying Living Expenses: Not hard at all  Food Insecurity: No Food Insecurity  . Worried About Charity fundraiser in the Last Year: Never true  . Ran Out of Food in the Last Year: Never true  Transportation Needs: No Transportation Needs  . Lack of Transportation (Medical): No  . Lack of Transportation (Non-Medical): No  Physical Activity: Inactive  . Days of Exercise per Week: 0 days  . Minutes of Exercise per Session: 0 min  Stress: No Stress Concern Present  . Feeling of Stress : Not at all  Social Connections: Not on file    Tobacco Counseling Ready to quit: Not Answered Counseling given: Not Answered Comment: has the gum and patch.    Clinical Intake:  Pre-visit preparation completed: Yes  Pain : No/denies pain     Nutritional Status: BMI of 19-24  Normal Nutritional Risks: None Diabetes: No  How often do you need to have someone help you when you read instructions, pamphlets, or other written materials from your doctor or pharmacy?: 1 - Never What is the last grade level you completed in school?: GED  Diabetic? no  Interpreter Needed?: No  Information entered by :: NAllen LPN   Activities of Daily Living In your present state of health, do you have any difficulty performing the following activities: 05/29/2020 02/17/2020  Hearing? Y N  Comment sometimes -  Vision? Y N  Comment blurriness at times -  Difficulty concentrating or making decisions? N N  Walking or climbing stairs? N N  Dressing or bathing? N N  Doing errands, shopping? N N  Preparing Food and eating ? N -  Using the Toilet? N -  In the past six months, have you accidently leaked urine? N -  Do you have problems with loss of bowel control? N -  Managing your Medications? N -  Managing your Finances? N -  Housekeeping or managing your Housekeeping? N -  Some  recent data might be hidden    Patient Care Team: [name], DO as PCP - General (Family Medicine) Allyne Gee, MD as Consulting Physician (Cardiology) Erby Pian, MD as Referring Physician (Pulmonary Disease) Alfonso Patten, MD as Consulting Physician (Dermatology)  Indicate any recent Medical Services you may have received from other than [facility] providers in the past year (date may be approximate).     Assessment:   This is a routine wellness examination for [place].  Hearing/Vision screen  Hearing Screening   125Hz  250Hz  500Hz  1000Hz  2000Hz  3000Hz  4000Hz  6000Hz  8000Hz   Right ear:           Left ear:           Vision Screening Comments: Regular eye exams, [facility]  Dietary issues and exercise activities discussed: Current Exercise Habits: The patient does not participate in regular exercise at present  Goals    . Patient Stated     05/29/2020, quit smoking and eat healthier    . Quit Smoking     Smoking cessation discussed      Depression Screen PHQ 2/9 Scores 05/29/2020 05/29/2020 05/16/2019 11/15/2018 09/28/2018 10/11/2017 10/11/2017  PHQ - 2 Score 0 0 0 0 1 1 1   PHQ- 9 Score - - - - 3 4 4     Fall Risk Fall Risk  05/29/2020 05/29/2020 05/16/2019 11/15/2018 09/28/2018  Falls in the past year? 1 1 0 0 0  Comment tripped - - - -  Number falls in past yr: 0 0 0 - 0  Injury with Fall? 0 1 0 - 0  Risk for fall due to : Medication side effect - - - -  Follow up Falls evaluation completed;Education provided;Falls prevention discussed - - - -    FALL RISK PREVENTION PERTAINING TO THE HOME:  Any stairs in or around the home? No  If so, are there any without handrails? n/a Home free of loose throw rugs in walkways, pet beds, electrical cords, etc? Yes  Adequate lighting in your home to reduce risk of falls? Yes   ASSISTIVE DEVICES UTILIZED TO PREVENT FALLS:  Life alert? No  Use of a cane, walker or w/c? No  Grab bars in the bathroom? Yes  Shower chair or bench in  shower? Yes  Elevated toilet seat or  a handicapped toilet? No   TIMED UP AND GO:  Was the test performed? No .    Cognitive Function:     6CIT Screen 05/29/2020 05/16/2019 11/15/2018 10/11/2017  What Year? 0 points 0 points 0 points 0 points  What month? 0 points 0 points 0 points 0 points  What time? 0 points 0 points 0 points 0 points  Count back from 20 0 points 0 points 0 points 0 points  Months in reverse 0 points 0 points 0 points 0 points  Repeat phrase 0 points 0 points 0 points 0 points  Total Score 0 0 0 0    Immunizations Immunization History  Administered Date(s) Administered  . Fluad Quad(high Dose 65+) 11/15/2018, 11/18/2019  . Influenza, High Dose Seasonal PF 01/04/2016, 12/23/2016, 12/25/2017  . Influenza,inj,Quad PF,6+ Mos 02/11/2015  . Influenza-Unspecified 12/13/2013  . PFIZER(Purple Top)SARS-COV-2 Vaccination 05/01/2019, 05/22/2019, 02/06/2020  . Pneumococcal Conjugate-13 08/14/2013  . Pneumococcal Polysaccharide-23 03/04/2014  . Td 12/03/1994  . Zoster 02/07/2011    TDAP status: Up to date  Flu Vaccine status: Up to date  Pneumococcal vaccine status: Up to date  Covid-19 vaccine status: Completed vaccines  Qualifies for Shingles Vaccine? Yes   Zostavax completed Yes   Shingrix Completed?: needs second dose  Screening Tests Health Maintenance  Topic Date Due  . TETANUS/TDAP  06/21/2020  . COVID-19 Vaccine (4 - Booster for Pfizer series) 08/06/2020  . MAMMOGRAM  11/18/2021  . COLONOSCOPY (Pts 45-37yrs Insurance coverage will need to be confirmed)  01/17/2023  . INFLUENZA VACCINE  Completed  . DEXA SCAN  Completed  . Hepatitis C Screening  Completed  . PNA vac Low Risk Adult  Completed  . HPV VACCINES  Aged Out    Health Maintenance  There are no preventive care reminders to display for this patient.  Colorectal cancer screening: Type of screening: Colonoscopy. Completed 01/16/2018. Repeat every 5 years  Mammogram status: Completed  11/19/2019. Repeat every year  Bone Density status: Completed 08/22/2012.   Lung Cancer Screening: (Low Dose CT Chest recommended if Age 70-80 years, 30 pack-year currently smoking OR have quit w/in 15years.) does qualify.   Lung Cancer Screening Referral: had 11/01/2019  Additional Screening:  Hepatitis C Screening: does qualify; Completed 09/15/2015  Vision Screening: Recommended annual ophthalmology exams for early detection of glaucoma and other disorders of the eye. Is the patient up to date with their annual eye exam?  Yes  Who is the provider or what is the name of the office in which the patient attends annual eye exams? [facility] If pt is not established with a provider, would they like to be referred to a provider to establish care? No .   Dental Screening: Recommended annual dental exams for proper oral hygiene  Community Resource Referral / Chronic Care Management: CRR required this visit?  No   CCM required this visit?  No      Plan:     I have personally reviewed and noted the following in the patient's chart:   . Medical and social history . Use of alcohol, tobacco or illicit drugs  . Current medications and supplements . Functional ability and status . Nutritional status . Physical activity . Advanced directives . List of other physicians . Hospitalizations, surgeries, and ER visits in previous 12 months . Vitals . Screenings to include cognitive, depression, and falls . Referrals and appointments  In addition, I have reviewed and discussed with patient certain preventive protocols, quality metrics, and best practice  recommendations. A written personalized care plan for preventive services as well as general preventive health recommendations were provided to patient.     Kellie Simmering, LPN   [id number]   Nurse Notes:

## 2020-05-29 NOTE — Assessment & Plan Note (Signed)
Intermittent hand and feet cramps. Encouraged her to drink fluids. Will check magnesium, vitamin b12 and folate.

## 2020-05-29 NOTE — Assessment & Plan Note (Signed)
Chronic, stable. Continue current regimen. Lipid panel drawn today.

## 2020-05-29 NOTE — Assessment & Plan Note (Signed)
Noticed a pain in her shoulders and neck for 1 month. Pain tends to be worse at night when she is trying to sleep and it is hard to lay on either side. She can take tylenol as needed and use ice/heat. Stretches given. If pain continues, will check x-rays. Follow-up in 3 months or sooner if pain worsens.

## 2020-05-29 NOTE — Assessment & Plan Note (Signed)
Currently smoking 2ppd. Discussed smoking cessation, not interested at this time.

## 2020-05-29 NOTE — Progress Notes (Signed)
Established Patient Office Visit  Subjective:  Patient ID: Kristi Clark, female    DOB: 1950/06/15  Age: 70 y.o. MRN: [id number]  CC:  Chief Complaint  Patient presents with  . shingles vaccine    Patient got the vaccine yesterday and is feeling a little bad, she states that she had a low grade temp over night  . cramps    Hands and feet  . Shoulder Pain    Has trouble sleeping sometimes due to the pain    HPI [name] presents for shoulder pain and hand/feet cramping and follow-up with hypertension, hyperlipidemia, and COPD. Has noticed cramping in her hands and feet intermittently over the last 3 weeks. States after she rubs her hands it goes away. It feels like a charlie horse that she gets in her calf sometimes.   SHOULDER PAIN  Duration: months Involved shoulder: bilateral Mechanism of injury: unknown Location: posterior Onset:gradual Severity: moderate  Quality: hurts Frequency: intermittent Radiation: no Aggravating factors: sleep  Alleviating factors: APAP  Status: fluctuating Treatments attempted: APAP  Relief with NSAIDs?:  No NSAIDs Taken Weakness: no Numbness: no Decreased grip strength: no Redness: no Swelling: no Bruising: no Fevers: no  HYPERTENSION / HYPERLIPIDEMIA  Satisfied with current treatment? yes Duration of hypertension: chronic BP monitoring frequency: not checking BP range: n/a BP medication side effects: no Past BP meds: losartan, metoprolol Duration of hyperlipidemia: chronic Cholesterol medication side effects: no Cholesterol supplements: none Past cholesterol medications: atorvastain (lipitor) Medication compliance: excellent compliance Aspirin: yes Recent stressors: no Recurrent headaches: no Visual changes: no Palpitations: no Dyspnea: no Chest pain: no Lower extremity edema: no Dizzy/lightheaded: no   COPD  COPD status: controlled Satisfied with current treatment?: yes Oxygen use: no Dyspnea  frequency: once in a while Cough frequency: not much Rescue inhaler frequency:  Not in last 3 months Limitation of activity: no Productive cough: rarely Last Spirometry: sees Dr. Raul Del routinely Pneumovax: Up to Date Influenza: Up to Date  Past Medical History:  Diagnosis Date  . COPD (chronic obstructive pulmonary disease) ([place])   . Depression   . GERD (gastroesophageal reflux disease)   . Hyperlipidemia   . Hypertension   . Menopause   . Osteopenia   . Tobacco abuse     Past Surgical History:  Procedure Laterality Date  . ABDOMINAL HYSTERECTOMY    . CARDIAC CATHETERIZATION    . CHOLECYSTECTOMY  2014  . COLONOSCOPY WITH PROPOFOL N/A 01/16/2018   Procedure: COLONOSCOPY WITH PROPOFOL;  Surgeon: Lollie Sails, MD;  Location: [facility] ENDOSCOPY;  Service: Endoscopy;  Laterality: N/A;  . ESOPHAGOGASTRODUODENOSCOPY (EGD) WITH PROPOFOL N/A 01/16/2018   Procedure: ESOPHAGOGASTRODUODENOSCOPY (EGD) WITH PROPOFOL;  Surgeon: Lollie Sails, MD;  Location: [facility] ENDOSCOPY;  Service: Endoscopy;  Laterality: N/A;  . heart stent      Family History  Problem Relation Age of Onset  . Cancer Mother   . Stroke Mother   . Heart disease Father   . Hyperlipidemia Father   . Cancer Sister   . Diabetes Sister   . Breast cancer Sister 70  . Diabetes Brother   . Asthma Son   . Cancer Son   . Diabetes Daughter   . Hypertension Daughter   . Cancer Maternal Grandmother        gallbladder  . Diabetes Brother   . Heart disease Brother   . Breast cancer Paternal Aunt     Social History   Socioeconomic History  . Marital status: Widowed  Spouse name: Not on file  . Number of children: Not on file  . Years of education: Not on file  . Highest education level: 10th grade  Occupational History  . Not on file  Tobacco Use  . Smoking status: Current Every Day Smoker    Packs/day: 2.00    Years: 56.00    Pack years: 112.00    Types: Cigarettes  . Smokeless tobacco: Never Used   . Tobacco comment: has the gum and patch.   Vaping Use  . Vaping Use: Never used  Substance and Sexual Activity  . Alcohol use: No  . Drug use: No  . Sexual activity: Never  Other Topics Concern  . Not on file  Social History Narrative  . Not on file   Social Determinants of Health   Financial Resource Strain: Not on file  Food Insecurity: Not on file  Transportation Needs: Not on file  Physical Activity: Not on file  Stress: Not on file  Social Connections: Not on file  Intimate Partner Violence: Not on file    Outpatient Medications Prior to Visit  Medication Sig Dispense Refill  . albuterol (VENTOLIN HFA) 108 (90 Base) MCG/ACT inhaler Inhale 2 puffs into the lungs every 6 (six) hours as needed for wheezing or shortness of breath. 18 g 0  . aspirin 81 MG tablet Take 81 mg by mouth daily.    Marland Kitchen atorvastatin (LIPITOR) 40 MG tablet Take 1 tablet (40 mg total) by mouth daily. 90 tablet 0  . Calcium Carbonate (CALCIUM 600 PO) Take by mouth daily.    . Cetirizine HCl (ZYRTEC PO) Take by mouth daily.    . clopidogrel (PLAVIX) 75 MG tablet Take 1 tablet (75 mg total) by mouth daily. 90 tablet 0  . fluticasone (FLONASE) 50 MCG/ACT nasal spray Place 2 sprays into both nostrils daily. 3 g 3  . Fluticasone-Salmeterol (ADVAIR) 250-50 MCG/DOSE AEPB Inhale 1 puff into the lungs 2 (two) times daily. 3 each 0  . hydrochlorothiazide (HYDRODIURIL) 25 MG tablet Take 1 tablet (25 mg total) by mouth daily. 90 tablet 0  . isosorbide mononitrate (IMDUR) 30 MG 24 hr tablet Take 1 tablet (30 mg total) by mouth daily. 90 tablet 0  . losartan (COZAAR) 50 MG tablet Take 1 tablet (50 mg total) by mouth daily. 90 tablet 1  . metoprolol succinate (TOPROL-XL) 50 MG 24 hr tablet Take 1 tablet (50 mg total) by mouth daily. Take with or immediately following a meal. 90 tablet 0  . Multiple Vitamins-Minerals (CENTRUM SILVER ADULT 50+ PO) Take 1 tablet by mouth daily.    . pantoprazole (PROTONIX) 40 MG tablet  Take 1 tablet (40 mg total) by mouth daily. 90 tablet 0  . Probiotic Product (PROBIOTIC-10 PO) Take by mouth. Every other day    . tiotropium (SPIRIVA) 18 MCG inhalation capsule Place 1 capsule (18 mcg total) into inhaler and inhale daily. 90 capsule 3  . azithromycin (ZITHROMAX) 250 MG tablet Take 1 tablet (250 mg total) by mouth daily. Take first 2 tablets together, then 1 every day until finished. 6 tablet 0  . fluconazole (DIFLUCAN) 150 MG tablet Take 1 tablet (150 mg total) by mouth daily. May repeat after 24 hours if not better 2 tablet 0  . nortriptyline (PAMELOR) 10 MG capsule Take 1 capsule (10 mg total) by mouth at bedtime. (Patient not taking: No sig reported) 30 capsule 3  . ondansetron (ZOFRAN ODT) 4 MG disintegrating tablet Take 1 tablet (4 mg  total) by mouth every 8 (eight) hours as needed for nausea or vomiting. (Patient not taking: No sig reported) 30 tablet 0   No facility-administered medications prior to visit.    Allergies  Allergen Reactions  . Clindamycin/Lincomycin Hives  . Prednisone Other (See Comments)    Psychosis  . Amoxicillin Rash  . Avelox [Moxifloxacin Hcl In Nacl] Rash  . Codeine Sulfate Nausea Only  . Penicillins Rash  . Sulfa Antibiotics Rash    ROS Review of Systems  Constitutional: Negative for fatigue and unexpected weight change.  HENT: Negative.   Eyes: Negative.   Respiratory: Positive for cough (intermittent).   Cardiovascular: Negative.   Gastrointestinal: Negative.   Endocrine: Negative.   Genitourinary: Negative.   Musculoskeletal: Positive for arthralgias (shoulder pain).  Skin: Negative.   Neurological: Negative.   Psychiatric/Behavioral: Negative.       Objective:    Physical Exam Vitals and nursing note reviewed.  Constitutional:      General: She is not in acute distress.    Appearance: Normal appearance.  HENT:     Head: Normocephalic and atraumatic.  Eyes:     Conjunctiva/sclera: Conjunctivae normal.  Neck:      Vascular: No carotid bruit.  Cardiovascular:     Rate and Rhythm: Normal rate and regular rhythm.     Pulses: Normal pulses.     Heart sounds: Normal heart sounds.  Pulmonary:     Effort: Pulmonary effort is normal.     Breath sounds: Normal breath sounds.  Musculoskeletal:        General: Normal range of motion.     Cervical back: Normal range of motion.     Comments: Full ROM in bilateral shoulders and neck  Skin:    General: Skin is warm and dry.  Neurological:     General: No focal deficit present.     Mental Status: She is alert and oriented to person, place, and time.  Psychiatric:        Mood and Affect: Mood normal.        Behavior: Behavior normal.        Thought Content: Thought content normal.        Judgment: Judgment normal.     BP (!) 132/58 (BP Location: Right Arm, Patient Position: Sitting)   Pulse 84   Temp 98.6 F (37 C)   Wt 114 lb 8 oz (51.9 kg)   LMP  (LMP Unknown)   SpO2 98%   BMI 23.13 kg/m  Wt Readings from Last 3 Encounters:  05/29/20 114 lb 8 oz (51.9 kg)  05/11/20 115 lb (52.2 kg)  03/02/20 117 lb 1 oz (53.1 kg)     Lab Results  Component Value Date   TSH 0.829 12/23/2019   Lab Results  Component Value Date   WBC 6.7 03/02/2020   HGB 11.0 (L) 03/02/2020   HCT 32.6 (L) 03/02/2020   MCV 92.9 03/02/2020   PLT 296 03/02/2020   Lab Results  Component Value Date   NA 131 (L) 03/02/2020   K 3.2 (L) 03/02/2020   CO2 29 03/02/2020   GLUCOSE 117 (H) 03/02/2020   BUN 17 03/02/2020   CREATININE 0.85 03/02/2020   BILITOT 0.4 02/25/2020   ALKPHOS 74 02/25/2020   AST 23 02/25/2020   ALT 32 02/25/2020   PROT 6.4 02/25/2020   ALBUMIN 4.0 02/25/2020   CALCIUM 9.1 03/02/2020   ANIONGAP 9 03/02/2020   Lab Results  Component Value Date   CHOL  116 11/18/2019   Lab Results  Component Value Date   HDL 51 11/18/2019   Lab Results  Component Value Date   LDLCALC 47 11/18/2019   Lab Results  Component Value Date   TRIG 96 11/18/2019       Assessment & Plan:   Problem List Items Addressed This Visit      Cardiovascular and Mediastinum   Benign hypertension with chronic kidney disease - Primary    Chronic, stable. BP 132/58 today. Continue current regimen. Will check CMP and CBC.      Relevant Orders   Comp Met (CMET)   CBC w/Diff   Aortic atherosclerosis ([place])    Noted on imaging. Continue lipitor, plavix, and aspirin. Encouraged smoking cessation. Check lipid panel today.      Relevant Orders   Lipid Panel w/o Chol/HDL Ratio     Respiratory   COPD, severe (HCC)    Chronic, stable. Had exacerbation in December 2021. Resolved now, feels like breathing is baseline. She follows with Dr. Raul Del in pulmonology. Continue current regimen. Follow-up in 3 months.      Relevant Orders   CBC w/Diff     Digestive   GERD (gastroesophageal reflux disease)    Chronic, stable on protonix, Continue current regimen.      Relevant Orders   Folate   Vitamin B12     Other   Anemia    Chronic. Iron studies in 11/2019 were normal. With hand/feet cramping will check magnesium, vitamin b12 and folate. Will await results      Hyperlipidemia    Chronic, stable. Continue current regimen. Lipid panel drawn today.      Relevant Orders   Lipid Panel w/o Chol/HDL Ratio   Tobacco abuse    Currently smoking 2ppd. Discussed smoking cessation, not interested at this time.       Cramps, extremity    Intermittent hand and feet cramps. Encouraged her to drink fluids. Will check magnesium, vitamin b12 and folate.       Relevant Orders   Magnesium   Acute pain of both shoulders    Noticed a pain in her shoulders and neck for 1 month. Pain tends to be worse at night when she is trying to sleep and it is hard to lay on either side. She can take tylenol as needed and use ice/heat. Stretches given. If pain continues, will check x-rays. Follow-up in 3 months or sooner if pain worsens.        Other Visit Diagnoses    Long-term  use of high-risk medication       With protonix, cramping, and anemia will check magnesium, vitamin B12 and folate   Relevant Orders   Folate   Vitamin B12      No orders of the defined types were placed in this encounter.   Follow-up: Return in about 3 months (around 08/29/2020) for copd, shoulder pain.    Charyl Dancer, NP

## 2020-05-29 NOTE — Patient Instructions (Addendum)
Leg, Hand, Feet Cramps Leg cramps occur when one or more muscles tighten and a person has no control over it (involuntary muscle contraction). Muscle cramps are most common in the calf muscles of the leg. They can occur during exercise or at rest. Leg cramps are painful, and they may last for a few seconds to a few minutes. Cramps may return several times before they finally stop. Usually, leg cramps are not caused by a serious medical problem. In many cases, the cause is not known. Some common causes include:  Excessive physical effort (overexertion), such as during intense exercise.  Doing the same motion over and over.  Staying in a certain position for a long period of time.  Improper preparation, form, or technique while doing a sport or an activity.  Dehydration.  Injury.  Side effects of certain medicines.  Abnormally low levels of minerals in your blood (electrolytes), especially potassium and calcium. This could result from: ? Pregnancy. ? Taking diuretic medicines. Follow these instructions at home: Eating and drinking  Drink enough fluid to keep your urine pale yellow. Staying hydrated may help prevent cramps.  Eat a healthy diet that includes plenty of nutrients to help your muscles function. A healthy diet includes fruits and vegetables, lean protein, whole grains, and low-fat or nonfat dairy products. Managing pain, stiffness, and swelling  Try massaging, stretching, and relaxing the affected muscle. Do this for several minutes at a time.  If directed, put ice on areas that are sore or painful after a cramp. To do this: ? Put ice in a plastic bag. ? Place a towel between your skin and the bag. ? Leave the ice on for 20 minutes, 2-3 times a day. ? Remove the ice if your skin turns bright red. This is very important. If you cannot feel pain, heat, or cold, you have a greater risk of damage to the area.  If directed, apply heat to muscles that are tense or tight. Do  this before you exercise, or as often as told by your health care provider. Use the heat source that your health care provider recommends, such as a moist heat pack or a heating pad. To do this: ? Place a towel between your skin and the heat source. ? Leave the heat on for 20-30 minutes. ? Remove the heat if your skin turns bright red. This is especially important if you are unable to feel pain, heat, or cold. You may have a greater risk of getting burned.  Try taking hot showers or baths to help relax tight muscles.      General instructions  If you are having frequent leg cramps, avoid intense exercise for several days.  Take over-the-counter and prescription medicines only as told by your health care provider.  Keep all follow-up visits. This is important. Contact a health care provider if:  Your leg cramps get more severe or more frequent, or they do not improve over time.  Your foot becomes cold, numb, or blue. Summary  Muscle cramps can develop in any muscle, but the most common place is in the calf muscles of the leg.  Leg cramps are painful, and they may last for a few seconds to a few minutes.  Usually, leg cramps are not caused by a serious medical problem. Often, the cause is not known.  Stay hydrated, and take over-the-counter and prescription medicines only as told by your health care provider. This information is not intended to replace advice given to you  by your health care provider. Make sure you discuss any questions you have with your health care provider. Document Revised: 07/10/2019 Document Reviewed: 07/10/2019 Elsevier Patient Education  2021 [place].    Shoulder Range of Motion Exercises Shoulder range of motion (ROM) exercises are done to keep the shoulder moving freely or to increase movement. They are often recommended for people who have shoulder pain or stiffness or who are recovering from a shoulder surgery. Phase 1 exercises When you are  able, do this exercise 1-2 times per day for 30-60 seconds in each direction, or as directed by your health care provider. Pendulum exercise To do this exercise while sitting: 1. Sit in a chair or at the edge of your bed with your feet flat on the floor. 2. Let your affected arm hang down in front of you over the edge of the bed or chair. 3. Relax your shoulder, arm, and hand. [place] your body so your arm gently swings in small circles. You can also use your unaffected arm to start the motion. 5. Repeat changing the direction of the circles, swinging your arm left and right, and swinging your arm forward and back. To do this exercise while standing: 1. Stand next to a sturdy chair or table, and hold on to it with your hand on your unaffected side. 2. Bend forward at the waist. 3. Bend your knees slightly. 4. Relax your shoulder, arm, and hand. 5. While keeping your shoulder relaxed, use body motion to swing your arm in small circles. 6. Repeat changing the direction of the circles, swinging your arm left and right, and swinging your arm forward and back. 7. Between exercises, stand up tall and take a short break to relax your lower back.   Phase 2 exercises Do these exercises 1-2 times per day or as told by your health care provider. Hold each stretch for 30 seconds, and repeat 3 times. Do the exercises with one or both arms as instructed by your health care provider. For these exercises, sit at a table with your hand and arm supported by the table. A chair that slides easily or has wheels can be helpful. External rotation 1. Turn your chair so that your affected side is nearest to the table. 2. Place your forearm on the table to your side. Bend your elbow about 90 at the elbow (right angle) and place your hand palm facing down on the table. Your elbow should be about 6 inches away from your side. 3. Keeping your arm on the table, lean your body forward. Abduction 1. Turn your chair so that  your affected side is nearest to the table. 2. Place your forearm and hand on the table so that your thumb points toward the ceiling and your arm is straight out to your side. 3. Slide your hand out to the side and away from you, using your unaffected arm to do the work. 4. To increase the stretch, you can slide your chair away from the table. Flexion: forward stretch 1. Sit facing the table. Place your hand and elbow on the table in front of you. 2. Slide your hand forward and away from you, using your unaffected arm to do the work. 3. To increase the stretch, you can slide your chair backward. Phase 3 exercises Do these exercises 1-2 times per day or as told by your health care provider. Hold each stretch for 30 seconds, and repeat 3 times. Do the exercises with one or both  arms as instructed by your health care provider. Cross-body stretch: posterior capsule stretch 1. Lift your arm straight out in front of you. 2. Bend your arm 90 at the elbow (right angle) so your forearm moves across your body. 3. Use your other arm to gently pull the elbow across your body, toward your other shoulder. Wall climbs 1. Stand with your affected arm extended out to the side with your hand resting on a door frame. 2. Slide your hand slowly up the door frame. 3. To increase the stretch, step through the door frame. Keep your body upright and do not lean. Wand exercises You will need a cane, a piece of PVC pipe, or a sturdy wooden dowel for wand exercises. Flexion To do this exercise while standing: 1. Hold the wand with both of your hands, palms down. 2. Using the other arm to help, lift your arms up and over your head, if able. 3. Push upward with your other arm to gently increase the stretch. To do this exercise while lying down: 1. Lie on your back with your elbows resting on the floor and the wand in both your hands. Your hands will be palm down, or pointing toward your feet. 2. Lift your hands toward  the ceiling, using your unaffected arm to help if needed. 3. Bring your arms overhead as able, using your unaffected arm to help if needed. Internal rotation 1. Stand while holding the wand behind you with both hands. Your unaffected arm should be extended above your head with the arm of the affected side extended behind you at the level of your waist. The wand should be pointing straight up and down as you hold it. 2. Slowly pull the wand up behind your back by straightening the elbow of your unaffected arm and bending the elbow of your affected arm. External rotation 1. Lie on your back with your affected upper arm supported on a small pillow or rolled towel. When you first do this exercise, keep your upper arm close to your body. Over time, bring your arm up to a 90 angle out to the side. 2. Hold the wand across your stomach and with both hands palm up. Your elbow on your affected side should be bent at a 90 angle. 3. Use your unaffected side to help push your forearm away from you and toward the floor. Keep your elbow on your affected side bent at a 90 angle. Contact a health care provider if you have:  New or increasing pain.  New numbness, tingling, weakness, or discoloration in your arm or hand. This information is not intended to replace advice given to you by your health care provider. Make sure you discuss any questions you have with your health care provider. Document Revised: 04/05/2017 Document Reviewed: 04/05/2017 Elsevier Patient Education  2021 [employer].

## 2020-05-29 NOTE — Assessment & Plan Note (Signed)
Noted on imaging. Continue lipitor, plavix, and aspirin. Encouraged smoking cessation. Check lipid panel today.

## 2020-05-29 NOTE — Assessment & Plan Note (Signed)
Chronic, stable on protonix, Continue current regimen.

## 2020-05-29 NOTE — Assessment & Plan Note (Signed)
Chronic, stable. Had exacerbation in December 2021. Resolved now, feels like breathing is baseline. She follows with Dr. Raul Del in pulmonology. Continue current regimen. Follow-up in 3 months.

## 2020-05-29 NOTE — Patient Instructions (Signed)
[name] , Thank you for taking time to come for your Medicare Wellness Visit. I appreciate your ongoing commitment to your health goals. Please review the following plan we discussed and let me know if I can assist you in the future.   Screening recommendations/referrals: Colonoscopy: completed 01/16/2018, due 01/17/2023 Mammogram: completed 11/19/2019 Bone Density: completed 08/22/2012 Recommended yearly ophthalmology/optometry visit for glaucoma screening and checkup Recommended yearly dental visit for hygiene and checkup  Vaccinations: Influenza vaccine: completed 11/18/2019, due 10/05/2020 Pneumococcal vaccine: completed 03/04/2014 Tdap vaccine: completed 06/22/2010, due 06/21/2020 Shingles vaccine: needs second dose   Covid-19: 02/06/2020, 05/22/2019, 05/01/2019  Advanced directives: Please bring a copy of your POA (Power of Attorney) and/or Living Will to your next appointment.   Conditions/risks identified: smoking  Next appointment: Follow up in one year for your annual wellness visit    Preventive Care 72 Years and Older, Female Preventive care refers to lifestyle choices and visits with your health care provider that can promote health and wellness. What does preventive care include?  A yearly physical exam. This is also called an annual well check.  Dental exams once or twice a year.  Routine eye exams. Ask your health care provider how often you should have your eyes checked.  Personal lifestyle choices, including:  Daily care of your teeth and gums.  Regular physical activity.  Eating a healthy diet.  Avoiding tobacco and drug use.  Limiting alcohol use.  Practicing safe sex.  Taking low-dose aspirin every day.  Taking vitamin and mineral supplements as recommended by your health care provider. What happens during an annual well check? The services and screenings done by your health care provider during your annual well check will depend on your age,  overall health, lifestyle risk factors, and family history of disease. Counseling  Your health care provider may ask you questions about your:  Alcohol use.  Tobacco use.  Drug use.  Emotional well-being.  Home and relationship well-being.  Sexual activity.  Eating habits.  History of falls.  Memory and ability to understand (cognition).  Work and work Statistician.  Reproductive health. Screening  You may have the following tests or measurements:  Height, weight, and BMI.  Blood pressure.  Lipid and cholesterol levels. These may be checked every 5 years, or more frequently if you are over 35 years old.  Skin check.  Lung cancer screening. You may have this screening every year starting at age 72 if you have a 30-pack-year history of smoking and currently smoke or have quit within the past 15 years.  Fecal occult blood test (FOBT) of the stool. You may have this test every year starting at age 72.  Flexible sigmoidoscopy or colonoscopy. You may have a sigmoidoscopy every 5 years or a colonoscopy every 10 years starting at age 72.  Hepatitis C blood test.  Hepatitis B blood test.  Sexually transmitted disease (STD) testing.  Diabetes screening. This is done by checking your blood sugar (glucose) after you have not eaten for a while (fasting). You may have this done every 1-3 years.  Bone density scan. This is done to screen for osteoporosis. You may have this done starting at age 72.  Mammogram. This may be done every 1-2 years. Talk to your health care provider about how often you should have regular mammograms. Talk with your health care provider about your test results, treatment options, and if necessary, the need for more tests. Vaccines  Your health care provider may recommend certain vaccines,  such as:  Influenza vaccine. This is recommended every year.  Tetanus, diphtheria, and acellular pertussis (Tdap, Td) vaccine. You may need a Td booster every 10  years.  Zoster vaccine. You may need this after age 72.  Pneumococcal 13-valent conjugate (PCV13) vaccine. One dose is recommended after age 72.  Pneumococcal polysaccharide (PPSV23) vaccine. One dose is recommended after age 72. Talk to your health care provider about which screenings and vaccines you need and how often you need them. This information is not intended to replace advice given to you by your health care provider. Make sure you discuss any questions you have with your health care provider. Document Released: 03/20/2015 Document Revised: 11/11/2015 Document Reviewed: 12/23/2014 Elsevier Interactive Patient Education  2017 [place] Prevention in the Home Falls can cause injuries. They can happen to people of all ages. There are many things you can do to make your home safe and to help prevent falls. What can I do on the outside of my home?  Regularly fix the edges of walkways and driveways and fix any cracks.  Remove anything that might make you trip as you walk through a door, such as a raised step or threshold.  Trim any bushes or trees on the path to your home.  Use bright outdoor lighting.  Clear any walking paths of anything that might make someone trip, such as rocks or tools.  Regularly check to see if handrails are loose or broken. Make sure that both sides of any steps have handrails.  Any raised decks and porches should have guardrails on the edges.  Have any leaves, snow, or ice cleared regularly.  Use sand or salt on walking paths during winter.  Clean up any spills in your garage right away. This includes oil or grease spills. What can I do in the bathroom?  Use night lights.  Install grab bars by the toilet and in the tub and shower. Do not use towel bars as grab bars.  Use non-skid mats or decals in the tub or shower.  If you need to sit down in the shower, use a plastic, non-slip stool.  Keep the floor dry. Clean up any water that  spills on the floor as soon as it happens.  Remove soap buildup in the tub or shower regularly.  Attach bath mats securely with double-sided non-slip rug tape.  Do not have throw rugs and other things on the floor that can make you trip. What can I do in the bedroom?  Use night lights.  Make sure that you have a light by your bed that is easy to reach.  Do not use any sheets or blankets that are too big for your bed. They should not hang down onto the floor.  Have a firm chair that has side arms. You can use this for support while you get dressed.  Do not have throw rugs and other things on the floor that can make you trip. What can I do in the kitchen?  Clean up any spills right away.  Avoid walking on wet floors.  Keep items that you use a lot in easy-to-reach places.  If you need to reach something above you, use a strong step stool that has a grab bar.  Keep electrical cords out of the way.  Do not use floor polish or wax that makes floors slippery. If you must use wax, use non-skid floor wax.  Do not have throw rugs and other things on  the floor that can make you trip. What can I do with my stairs?  Do not leave any items on the stairs.  Make sure that there are handrails on both sides of the stairs and use them. Fix handrails that are broken or loose. Make sure that handrails are as long as the stairways.  Check any carpeting to make sure that it is firmly attached to the stairs. Fix any carpet that is loose or worn.  Avoid having throw rugs at the top or bottom of the stairs. If you do have throw rugs, attach them to the floor with carpet tape.  Make sure that you have a light switch at the top of the stairs and the bottom of the stairs. If you do not have them, ask someone to add them for you. What else can I do to help prevent falls?  Wear shoes that:  Do not have high heels.  Have rubber bottoms.  Are comfortable and fit you well.  Are closed at the  toe. Do not wear sandals.  If you use a stepladder:  Make sure that it is fully opened. Do not climb a closed stepladder.  Make sure that both sides of the stepladder are locked into place.  Ask someone to hold it for you, if possible.  Clearly mark and make sure that you can see:  Any grab bars or handrails.  First and last steps.  Where the edge of each step is.  Use tools that help you move around (mobility aids) if they are needed. These include:  Canes.  Walkers.  Scooters.  Crutches.  Turn on the lights when you go into a dark area. Replace any light bulbs as soon as they burn out.  Set up your furniture so you have a clear path. Avoid moving your furniture around.  If any of your floors are uneven, fix them.  If there are any pets around you, be aware of where they are.  Review your medicines with your doctor. Some medicines can make you feel dizzy. This can increase your chance of falling. Ask your doctor what other things that you can do to help prevent falls. This information is not intended to replace advice given to you by your health care provider. Make sure you discuss any questions you have with your health care provider. Document Released: 12/18/2008 Document Revised: 07/30/2015 Document Reviewed: 03/28/2014 Elsevier Interactive Patient Education  2017 [employer].

## 2020-05-30 LAB — COMPREHENSIVE METABOLIC PANEL
ALT: 19 IU/L (ref 0–32)
AST: 22 IU/L (ref 0–40)
Albumin/Globulin Ratio: 1.8 (ref 1.2–2.2)
Albumin: 4 g/dL (ref 3.7–4.7)
Alkaline Phosphatase: 88 IU/L (ref 44–121)
BUN/Creatinine Ratio: 12 (ref 12–28)
BUN: 10 mg/dL (ref 8–27)
Bilirubin Total: 0.4 mg/dL (ref 0.0–1.2)
CO2: 24 mmol/L (ref 20–29)
Calcium: 9.1 mg/dL (ref 8.7–10.3)
Chloride: 90 mmol/L — ABNORMAL LOW (ref 96–106)
Creatinine, Ser: 0.84 mg/dL (ref 0.57–1.00)
Globulin, Total: 2.2 g/dL (ref 1.5–4.5)
Glucose: 125 mg/dL — ABNORMAL HIGH (ref 65–99)
Potassium: 3.6 mmol/L (ref 3.5–5.2)
Sodium: 130 mmol/L — ABNORMAL LOW (ref 134–144)
Total Protein: 6.2 g/dL (ref 6.0–8.5)
eGFR: 74 mL/min/{1.73_m2} (ref >59)

## 2020-05-30 LAB — CBC WITH DIFFERENTIAL/PLATELET
Basophils Absolute: 0 10*3/uL (ref 0.0–0.2)
Basos: 1 %
EOS (ABSOLUTE): 0 10*3/uL (ref 0.0–0.4)
Eos: 1 %
Hematocrit: 34.9 % (ref 34.0–46.6)
Hemoglobin: 11.5 g/dL (ref 11.1–15.9)
Immature Grans (Abs): 0 10*3/uL (ref 0.0–0.1)
Immature Granulocytes: 0 %
Lymphocytes Absolute: 1.7 10*3/uL (ref 0.7–3.1)
Lymphs: 28 %
MCH: 30.7 pg (ref 26.6–33.0)
MCHC: 33 g/dL (ref 31.5–35.7)
MCV: 93 fL (ref 79–97)
Monocytes Absolute: 0.4 10*3/uL (ref 0.1–0.9)
Monocytes: 7 %
Neutrophils Absolute: 3.9 10*3/uL (ref 1.4–7.0)
Neutrophils: 63 %
Platelets: 260 10*3/uL (ref 150–450)
RBC: 3.75 x10E6/uL — ABNORMAL LOW (ref 3.77–5.28)
RDW: 13.5 % (ref 11.7–15.4)
WBC: 6.1 10*3/uL (ref 3.4–10.8)

## 2020-05-30 LAB — LIPID PANEL W/O CHOL/HDL RATIO
Cholesterol, Total: 123 mg/dL (ref 100–199)
HDL: 56 mg/dL (ref >39)
LDL Chol Calc (NIH): 53 mg/dL (ref 0–99)
Triglycerides: 68 mg/dL (ref 0–149)
VLDL Cholesterol Cal: 14 mg/dL (ref 5–40)

## 2020-05-30 LAB — VITAMIN B12: Vitamin B-12: 1133 pg/mL (ref 232–1245)

## 2020-05-30 LAB — FOLATE: Folate: >20 ng/mL (ref >3.0)

## 2020-05-30 LAB — MAGNESIUM: Magnesium: 1.8 mg/dL (ref 1.6–2.3)

## 2020-06-05 ENCOUNTER — Other Ambulatory Visit: Payer: Self-pay

## 2020-06-05 ENCOUNTER — Ambulatory Visit
Admission: RE | Admit: 2020-06-05 | Discharge: 2020-06-05 | Disposition: A | Discharge status: Home / Self Care | Payer: Medicare Other | Source: Ambulatory Visit | Attending: Specialist | Admitting: Specialist

## 2020-06-05 DIAGNOSIS — J432 Centrilobular emphysema: Secondary | ICD-10-CM | POA: Diagnosis not present

## 2020-06-05 DIAGNOSIS — J849 Interstitial pulmonary disease, unspecified: Secondary | ICD-10-CM | POA: Diagnosis not present

## 2020-06-05 DIAGNOSIS — I2510 Atherosclerotic heart disease of native coronary artery without angina pectoris: Secondary | ICD-10-CM | POA: Diagnosis not present

## 2020-06-05 DIAGNOSIS — R0600 Dyspnea, unspecified: Secondary | ICD-10-CM | POA: Diagnosis not present

## 2020-06-05 DIAGNOSIS — J479 Bronchiectasis, uncomplicated: Secondary | ICD-10-CM | POA: Diagnosis not present

## 2020-07-27 ENCOUNTER — Encounter: Payer: Self-pay | Admitting: Family Medicine

## 2020-07-27 DIAGNOSIS — I2510 Atherosclerotic heart disease of native coronary artery without angina pectoris: Secondary | ICD-10-CM | POA: Diagnosis not present

## 2020-07-27 DIAGNOSIS — I10 Essential (primary) hypertension: Secondary | ICD-10-CM | POA: Diagnosis not present

## 2020-07-27 DIAGNOSIS — Z9861 Coronary angioplasty status: Secondary | ICD-10-CM | POA: Diagnosis not present

## 2020-07-27 DIAGNOSIS — J449 Chronic obstructive pulmonary disease, unspecified: Secondary | ICD-10-CM | POA: Diagnosis not present

## 2020-07-27 DIAGNOSIS — E782 Mixed hyperlipidemia: Secondary | ICD-10-CM | POA: Diagnosis not present

## 2020-07-27 DIAGNOSIS — R0602 Shortness of breath: Secondary | ICD-10-CM | POA: Diagnosis not present

## 2020-07-31 ENCOUNTER — Other Ambulatory Visit: Payer: Self-pay | Admitting: Family Medicine

## 2020-07-31 MED ORDER — CLOPIDOGREL BISULFATE 75 MG PO TABS: 75.0000 mg | ORAL_TABLET | Freq: Every day | ORAL | 0 refills | Status: DC

## 2020-07-31 MED ORDER — FLUTICASONE PROPIONATE 50 MCG/ACT NA SUSP: 2.0000 | Freq: Every day | NASAL | 3 refills | Status: DC

## 2020-07-31 MED ORDER — METOPROLOL SUCCINATE ER 50 MG PO TB24: 50.0000 mg | ORAL_TABLET | Freq: Every day | ORAL | 0 refills | Status: DC

## 2020-07-31 MED ORDER — PANTOPRAZOLE SODIUM 40 MG PO TBEC: 40.0000 mg | DELAYED_RELEASE_TABLET | Freq: Every day | ORAL | 0 refills | Status: DC

## 2020-07-31 MED ORDER — HYDROCHLOROTHIAZIDE 25 MG PO TABS: 25.0000 mg | ORAL_TABLET | Freq: Every day | ORAL | 0 refills | Status: DC

## 2020-08-05 DIAGNOSIS — R059 Cough, unspecified: Secondary | ICD-10-CM | POA: Diagnosis not present

## 2020-08-05 DIAGNOSIS — F17218 Nicotine dependence, cigarettes, with other nicotine-induced disorders: Secondary | ICD-10-CM | POA: Diagnosis not present

## 2020-08-05 DIAGNOSIS — R0600 Dyspnea, unspecified: Secondary | ICD-10-CM | POA: Diagnosis not present

## 2020-08-05 DIAGNOSIS — J449 Chronic obstructive pulmonary disease, unspecified: Secondary | ICD-10-CM | POA: Diagnosis not present

## 2020-08-10 DIAGNOSIS — E782 Mixed hyperlipidemia: Secondary | ICD-10-CM | POA: Diagnosis not present

## 2020-08-10 DIAGNOSIS — R079 Chest pain, unspecified: Secondary | ICD-10-CM | POA: Diagnosis not present

## 2020-08-10 DIAGNOSIS — R0602 Shortness of breath: Secondary | ICD-10-CM | POA: Diagnosis not present

## 2020-08-10 DIAGNOSIS — I10 Essential (primary) hypertension: Secondary | ICD-10-CM | POA: Diagnosis not present

## 2020-08-13 DIAGNOSIS — E782 Mixed hyperlipidemia: Secondary | ICD-10-CM | POA: Diagnosis not present

## 2020-08-13 DIAGNOSIS — R0602 Shortness of breath: Secondary | ICD-10-CM | POA: Diagnosis not present

## 2020-08-13 DIAGNOSIS — R079 Chest pain, unspecified: Secondary | ICD-10-CM | POA: Diagnosis not present

## 2020-08-13 DIAGNOSIS — I10 Essential (primary) hypertension: Secondary | ICD-10-CM | POA: Diagnosis not present

## 2020-08-17 ENCOUNTER — Other Ambulatory Visit: Payer: Self-pay | Admitting: Family Medicine

## 2020-08-18 MED ORDER — ALBUTEROL SULFATE HFA 108 (90 BASE) MCG/ACT IN AERS: 2.0000 | INHALATION_SPRAY | Freq: Four times a day (QID) | RESPIRATORY_TRACT | 0 refills | Status: DC | PRN

## 2020-08-24 DIAGNOSIS — R072 Precordial pain: Secondary | ICD-10-CM | POA: Diagnosis not present

## 2020-08-27 DIAGNOSIS — E782 Mixed hyperlipidemia: Secondary | ICD-10-CM | POA: Diagnosis not present

## 2020-08-27 DIAGNOSIS — R0602 Shortness of breath: Secondary | ICD-10-CM | POA: Diagnosis not present

## 2020-08-27 DIAGNOSIS — J449 Chronic obstructive pulmonary disease, unspecified: Secondary | ICD-10-CM | POA: Diagnosis not present

## 2020-08-27 DIAGNOSIS — Z9861 Coronary angioplasty status: Secondary | ICD-10-CM | POA: Diagnosis not present

## 2020-08-27 DIAGNOSIS — I2510 Atherosclerotic heart disease of native coronary artery without angina pectoris: Secondary | ICD-10-CM | POA: Diagnosis not present

## 2020-08-27 DIAGNOSIS — F17210 Nicotine dependence, cigarettes, uncomplicated: Secondary | ICD-10-CM | POA: Diagnosis not present

## 2020-08-27 DIAGNOSIS — R079 Chest pain, unspecified: Secondary | ICD-10-CM | POA: Diagnosis not present

## 2020-08-27 DIAGNOSIS — I10 Essential (primary) hypertension: Secondary | ICD-10-CM | POA: Diagnosis not present

## 2020-08-31 ENCOUNTER — Encounter: Payer: Self-pay | Admitting: Family Medicine

## 2020-08-31 ENCOUNTER — Other Ambulatory Visit: Payer: Self-pay

## 2020-08-31 ENCOUNTER — Other Ambulatory Visit: Payer: Self-pay | Admitting: Family Medicine

## 2020-08-31 ENCOUNTER — Ambulatory Visit (INDEPENDENT_AMBULATORY_CARE_PROVIDER_SITE_OTHER): Payer: Medicare Other | Admitting: Family Medicine

## 2020-08-31 VITALS — BP 146/56 | HR 66 | Temp 97.6°F | Resp 16 | Wt 116.2 lb

## 2020-08-31 DIAGNOSIS — M25511 Pain in right shoulder: Secondary | ICD-10-CM

## 2020-08-31 DIAGNOSIS — I129 Hypertensive chronic kidney disease with stage 1 through stage 4 chronic kidney disease, or unspecified chronic kidney disease: Secondary | ICD-10-CM

## 2020-08-31 DIAGNOSIS — E785 Hyperlipidemia, unspecified: Secondary | ICD-10-CM

## 2020-08-31 DIAGNOSIS — M25512 Pain in left shoulder: Secondary | ICD-10-CM | POA: Diagnosis not present

## 2020-08-31 DIAGNOSIS — J449 Chronic obstructive pulmonary disease, unspecified: Secondary | ICD-10-CM | POA: Diagnosis not present

## 2020-08-31 LAB — MICROALBUMIN, URINE WAIVED
Creatinine, Urine Waived: 200 mg/dL (ref 10–300)
Microalb, Ur Waived: 30 mg/L — ABNORMAL HIGH (ref 0–19)
Microalb/Creat Ratio: <30 mg/g (ref <30)

## 2020-08-31 MED ORDER — ALBUTEROL SULFATE HFA 108 (90 BASE) MCG/ACT IN AERS: 2.0000 | INHALATION_SPRAY | Freq: Four times a day (QID) | RESPIRATORY_TRACT | 3 refills | Status: DC | PRN

## 2020-08-31 MED ORDER — FLUTICASONE-SALMETEROL 250-50 MCG/ACT IN AEPB: 1.0000 | INHALATION_SPRAY | Freq: Two times a day (BID) | RESPIRATORY_TRACT | 3 refills | Status: DC

## 2020-08-31 MED ORDER — DICLOFENAC SODIUM 1 % EX GEL: 4.0000 g | Freq: Four times a day (QID) | CUTANEOUS | 3 refills | Status: DC

## 2020-08-31 MED ORDER — LOSARTAN POTASSIUM 50 MG PO TABS: 50.0000 mg | ORAL_TABLET | Freq: Every day | ORAL | 1 refills | Status: DC

## 2020-08-31 MED ORDER — ATORVASTATIN CALCIUM 40 MG PO TABS: 40.0000 mg | ORAL_TABLET | Freq: Every day | ORAL | 1 refills | Status: DC

## 2020-08-31 MED ORDER — ISOSORBIDE MONONITRATE ER 60 MG PO TB24: 60.0000 mg | ORAL_TABLET | Freq: Every day | ORAL | 1 refills | Status: DC

## 2020-08-31 NOTE — Assessment & Plan Note (Signed)
Under good control on current regimen. Continue current regimen. Continue to monitor. Call with any concerns. Refills given. Labs drawn today.   

## 2020-08-31 NOTE — Assessment & Plan Note (Signed)
Will treat with voltaren. Call if not getting better or getting worse.

## 2020-08-31 NOTE — Progress Notes (Signed)
 BP (!) 146/56   Pulse 66   Temp 97.6 F (36.4 C) (Oral)   Resp 16   Wt 116 lb 3.2 oz (52.7 kg)   LMP  (LMP Unknown)   BMI 23.47 kg/m    Subjective:    Patient ID: Kristi Clark, female    DOB: 12/23/1950, 70 y.o.   MRN: [id number]  HPI: Kristi Clark is a 70 y.o. female  Chief Complaint  Patient presents with   COPD   Shoulder Pain    Patient reports pain primarily in her left shoulder and states that she has pain when laying down.Patient describes pain in shoulder as ache and states at times she has pain that radiates to left side of neck, she states that she believes neck pain is triggered from stress. Patient reports that she has been taking otc Tylenol with little relief.    COPD COPD status: controlled Satisfied with current treatment?: yes Oxygen use: no Dyspnea frequency: with activity Cough frequency: rarely Rescue inhaler frequency:  occasionally Limitation of activity: no Pneumovax: Up to Date Influenza: Up to Date  SHOULDER PAIN Duration: 3+ months Involved shoulder: bilateral Mechanism of injury: unknown Location: diffuse Onset:gradual Severity: moderate  Quality:  aching and sore Frequency: intermittent Radiation: into her neck Aggravating factors: moving   Alleviating factors:  tylenol Status: fluctuating Treatments attempted: none  Relief with NSAIDs?:  No NSAIDs Taken Weakness: no Numbness: no Decreased grip strength: no Redness: no Swelling: no Bruising: no Fevers: no  HYPERTENSION / HYPERLIPIDEMIA Satisfied with current treatment? yes Duration of hypertension: chronic BP monitoring frequency: not checking BP medication side effects: no Duration of hyperlipidemia: chronic Cholesterol medication side effects: no Cholesterol supplements: none Past cholesterol medications: atorvastatin Medication compliance: excellent compliance Aspirin: yes Recent stressors: no Recurrent headaches: no Visual changes: no Palpitations:  no Dyspnea: no Chest pain: no Lower extremity edema: no Dizzy/lightheaded: no   Relevant past medical, surgical, family and social history reviewed and updated as indicated. Interim medical history since our last visit reviewed. Allergies and medications reviewed and updated.  Review of Systems  Constitutional: Negative.   Respiratory: Negative.    Cardiovascular: Negative.   Gastrointestinal: Negative.   Musculoskeletal: Negative.   Neurological: Negative.   Psychiatric/Behavioral: Negative.     Per HPI unless specifically indicated above     Objective:    BP (!) 146/56   Pulse 66   Temp 97.6 F (36.4 C) (Oral)   Resp 16   Wt 116 lb 3.2 oz (52.7 kg)   LMP  (LMP Unknown)   BMI 23.47 kg/m   Wt Readings from Last 3 Encounters:  08/31/20 116 lb 3.2 oz (52.7 kg)  05/29/20 114 lb (51.7 kg)  05/29/20 114 lb 8 oz (51.9 kg)    Physical Exam Vitals and nursing note reviewed.  Constitutional:      General: She is not in acute distress.    Appearance: Normal appearance. She is not ill-appearing, toxic-appearing or diaphoretic.  HENT:     Head: Normocephalic and atraumatic.     Right Ear: External ear normal.     Left Ear: External ear normal.     Nose: Nose normal.     Mouth/Throat:     Mouth: Mucous membranes are moist.     Pharynx: Oropharynx is clear.  Eyes:     General: No scleral icterus.       Right eye: No discharge.        Left eye: No discharge.       Extraocular Movements: Extraocular movements intact.     Conjunctiva/sclera: Conjunctivae normal.     Pupils: Pupils are equal, round, and reactive to light.  Cardiovascular:     Rate and Rhythm: Normal rate and regular rhythm.     Pulses: Normal pulses.     Heart sounds: Normal heart sounds. No murmur heard.   No friction rub. No gallop.  Pulmonary:     Effort: Pulmonary effort is normal. No respiratory distress.     Breath sounds: Normal breath sounds. No stridor. No wheezing, rhonchi or rales.  Chest:      Chest wall: No tenderness.  Musculoskeletal:        General: Normal range of motion.     Cervical back: Normal range of motion and neck supple.  Skin:    General: Skin is warm and dry.     Capillary Refill: Capillary refill takes less than 2 seconds.     Coloration: Skin is not jaundiced or pale.     Findings: No bruising, erythema, lesion or rash.  Neurological:     General: No focal deficit present.     Mental Status: She is alert and oriented to person, place, and time. Mental status is at baseline.  Psychiatric:        Mood and Affect: Mood normal.        Behavior: Behavior normal.        Thought Content: Thought content normal.        Judgment: Judgment normal.    Results for orders placed or performed in visit on 05/29/20  Comp Met (CMET)  Result Value Ref Range   Glucose 125 (H) 65 - 99 mg/dL   BUN 10 8 - 27 mg/dL   Creatinine, Ser 0.84 0.57 - 1.00 mg/dL   eGFR 74 >59 mL/min/1.73   BUN/Creatinine Ratio 12 12 - 28   Sodium 130 (L) 134 - 144 mmol/L   Potassium 3.6 3.5 - 5.2 mmol/L   Chloride 90 (L) 96 - 106 mmol/L   CO2 24 20 - 29 mmol/L   Calcium 9.1 8.7 - 10.3 mg/dL   Total Protein 6.2 6.0 - 8.5 g/dL   Albumin 4.0 3.7 - 4.7 g/dL   Globulin, Total 2.2 1.5 - 4.5 g/dL   Albumin/Globulin Ratio 1.8 1.2 - 2.2   Bilirubin Total 0.4 0.0 - 1.2 mg/dL   Alkaline Phosphatase 88 44 - 121 IU/L   AST 22 0 - 40 IU/L   ALT 19 0 - 32 IU/L  CBC w/Diff  Result Value Ref Range   WBC 6.1 3.4 - 10.8 x10E3/uL   RBC 3.75 (L) 3.77 - 5.28 x10E6/uL   Hemoglobin 11.5 11.1 - 15.9 g/dL   Hematocrit 34.9 34.0 - 46.6 %   MCV 93 79 - 97 fL   MCH 30.7 26.6 - 33.0 pg   MCHC 33.0 31.5 - 35.7 g/dL   RDW 13.5 11.7 - 15.4 %   Platelets 260 150 - 450 x10E3/uL   Neutrophils 63 Not Estab. %   Lymphs 28 Not Estab. %   Monocytes 7 Not Estab. %   Eos 1 Not Estab. %   Basos 1 Not Estab. %   Neutrophils Absolute 3.9 1.4 - 7.0 x10E3/uL   Lymphocytes Absolute 1.7 0.7 - 3.1 x10E3/uL   Monocytes  Absolute 0.4 0.1 - 0.9 x10E3/uL   EOS (ABSOLUTE) 0.0 0.0 - 0.4 x10E3/uL   Basophils Absolute 0.0 0.0 - 0.2 x10E3/uL   Immature Granulocytes 0 Not Estab. %     Immature Grans (Abs) 0.0 0.0 - 0.1 x10E3/uL  Lipid Panel w/o Chol/HDL Ratio  Result Value Ref Range   Cholesterol, Total 123 100 - 199 mg/dL   Triglycerides 68 0 - 149 mg/dL   HDL 56 >39 mg/dL   VLDL Cholesterol Cal 14 5 - 40 mg/dL   LDL Chol Calc (NIH) 53 0 - 99 mg/dL  Magnesium  Result Value Ref Range   Magnesium 1.8 1.6 - 2.3 mg/dL  Folate  Result Value Ref Range   Folate >20.0 >3.0 ng/mL  Vitamin B12  Result Value Ref Range   Vitamin B-12 1,133 232 - 1,245 pg/mL      Assessment & Plan:   Problem List Items Addressed This Visit       Cardiovascular and Mediastinum   Benign hypertension with chronic kidney disease    Under good control on current regimen. Continue current regimen. Continue to monitor. Call with any concerns. Refills given. Labs drawn today.        Relevant Medications   losartan (COZAAR) 50 MG tablet   isosorbide mononitrate (IMDUR) 60 MG 24 hr tablet   atorvastatin (LIPITOR) 40 MG tablet   Other Relevant Orders   CBC with Differential/Platelet   Comprehensive metabolic panel   Microalbumin, Urine Waived     Respiratory   COPD, severe (HCC) - Primary    Under good control on current regimen. Continue current regimen. Continue to monitor. Call with any concerns. Refills given. Labs drawn today.        Relevant Medications   albuterol (VENTOLIN HFA) 108 (90 Base) MCG/ACT inhaler   fluticasone-salmeterol (ADVAIR DISKUS) 250-50 MCG/ACT AEPB   Other Relevant Orders   CBC with Differential/Platelet   Comprehensive metabolic panel     Other   Hyperlipidemia    Under good control on current regimen. Continue current regimen. Continue to monitor. Call with any concerns. Refills given. Labs drawn today.        Relevant Medications   losartan (COZAAR) 50 MG tablet   isosorbide mononitrate  (IMDUR) 60 MG 24 hr tablet   atorvastatin (LIPITOR) 40 MG tablet   Other Relevant Orders   CBC with Differential/Platelet   Comprehensive metabolic panel   Lipid Panel w/o Chol/HDL Ratio   Acute pain of both shoulders    Will treat with voltaren. Call if not getting better or getting worse.        Relevant Orders   CBC with Differential/Platelet   Comprehensive metabolic panel     Follow up plan: Return in about 6 months (around 03/02/2021).       

## 2020-09-01 ENCOUNTER — Other Ambulatory Visit: Payer: Self-pay | Admitting: Family Medicine

## 2020-09-01 LAB — LIPID PANEL W/O CHOL/HDL RATIO
Cholesterol, Total: 135 mg/dL (ref 100–199)
HDL: 65 mg/dL (ref >39)
LDL Chol Calc (NIH): 55 mg/dL (ref 0–99)
Triglycerides: 77 mg/dL (ref 0–149)
VLDL Cholesterol Cal: 15 mg/dL (ref 5–40)

## 2020-09-01 LAB — COMPREHENSIVE METABOLIC PANEL
ALT: 18 IU/L (ref 0–32)
AST: 25 IU/L (ref 0–40)
Albumin/Globulin Ratio: 1.8 (ref 1.2–2.2)
Albumin: 4.4 g/dL (ref 3.7–4.7)
Alkaline Phosphatase: 100 IU/L (ref 44–121)
BUN/Creatinine Ratio: 11 — ABNORMAL LOW (ref 12–28)
BUN: 9 mg/dL (ref 8–27)
Bilirubin Total: 0.5 mg/dL (ref 0.0–1.2)
CO2: 27 mmol/L (ref 20–29)
Calcium: 9.8 mg/dL (ref 8.7–10.3)
Chloride: 88 mmol/L — ABNORMAL LOW (ref 96–106)
Creatinine, Ser: 0.85 mg/dL (ref 0.57–1.00)
Globulin, Total: 2.5 g/dL (ref 1.5–4.5)
Glucose: 88 mg/dL (ref 65–99)
Potassium: 4.1 mmol/L (ref 3.5–5.2)
Sodium: 128 mmol/L — ABNORMAL LOW (ref 134–144)
Total Protein: 6.9 g/dL (ref 6.0–8.5)
eGFR: 73 mL/min/{1.73_m2} (ref >59)

## 2020-09-01 LAB — CBC WITH DIFFERENTIAL/PLATELET
Basophils Absolute: 0.1 10*3/uL (ref 0.0–0.2)
Basos: 1 %
EOS (ABSOLUTE): 0.1 10*3/uL (ref 0.0–0.4)
Eos: 2 %
Hematocrit: 37.3 % (ref 34.0–46.6)
Hemoglobin: 12.4 g/dL (ref 11.1–15.9)
Immature Grans (Abs): 0 10*3/uL (ref 0.0–0.1)
Immature Granulocytes: 0 %
Lymphocytes Absolute: 2.2 10*3/uL (ref 0.7–3.1)
Lymphs: 37 %
MCH: 30.8 pg (ref 26.6–33.0)
MCHC: 33.2 g/dL (ref 31.5–35.7)
MCV: 93 fL (ref 79–97)
Monocytes Absolute: 0.5 10*3/uL (ref 0.1–0.9)
Monocytes: 8 %
Neutrophils Absolute: 3.1 10*3/uL (ref 1.4–7.0)
Neutrophils: 52 %
Platelets: 229 10*3/uL (ref 150–450)
RBC: 4.03 x10E6/uL (ref 3.77–5.28)
RDW: 13.7 % (ref 11.7–15.4)
WBC: 5.9 10*3/uL (ref 3.4–10.8)

## 2020-09-11 ENCOUNTER — Ambulatory Visit (INDEPENDENT_AMBULATORY_CARE_PROVIDER_SITE_OTHER): Payer: Medicare Other | Admitting: Family Medicine

## 2020-09-11 ENCOUNTER — Other Ambulatory Visit: Payer: Self-pay

## 2020-09-11 ENCOUNTER — Encounter: Payer: Self-pay | Admitting: Family Medicine

## 2020-09-11 VITALS — BP 167/71 | HR 76 | Temp 98.0°F | Wt 116.0 lb

## 2020-09-11 DIAGNOSIS — E871 Hypo-osmolality and hyponatremia: Secondary | ICD-10-CM

## 2020-09-11 DIAGNOSIS — I129 Hypertensive chronic kidney disease with stage 1 through stage 4 chronic kidney disease, or unspecified chronic kidney disease: Secondary | ICD-10-CM | POA: Diagnosis not present

## 2020-09-11 MED ORDER — LOSARTAN POTASSIUM 50 MG PO TABS: 75.0000 mg | ORAL_TABLET | Freq: Every day | ORAL | 1 refills | Status: DC

## 2020-09-11 NOTE — Assessment & Plan Note (Signed)
Running high since coming off HCTZ. Will increase her losartan to 75mg  and recheck 2 weeks. Call with any concerns.

## 2020-09-11 NOTE — Assessment & Plan Note (Signed)
Rechecking labs after stopping HCTZ. Await results.

## 2020-09-11 NOTE — Progress Notes (Signed)
BP (!) 167/71   Pulse 76   Temp 98 F (36.7 C)   Wt 116 lb (52.6 kg)   LMP  (LMP Unknown)   SpO2 97%   BMI 23.43 kg/m    Subjective:    Patient ID: Kristi Clark, female    DOB: [birth year], 70 y.o.   MRN: [id number]  HPI: Kristi Clark is a 70 y.o. female  Chief Complaint  Patient presents with   Hypertension    Patient states her BP has been running a little high lately    HYPERTENSION Hypertension status: exacerbated  Satisfied with current treatment? no Duration of hypertension: chronic BP monitoring frequency:  daily BP range: 140s-170s/60s-70s BP medication side effects:  no Medication compliance: excellent compliance Aspirin: yes Recurrent headaches: no Visual changes: no Palpitations: no Dyspnea: no Chest pain: no Lower extremity edema: no Dizzy/lightheaded: no  Relevant past medical, surgical, family and social history reviewed and updated as indicated. Interim medical history since our last visit reviewed. Allergies and medications reviewed and updated.  Review of Systems  Constitutional: Negative.   Respiratory: Negative.    Cardiovascular: Negative.   Gastrointestinal: Negative.   Musculoskeletal: Negative.   Neurological: Negative.   Psychiatric/Behavioral: Negative.     Per HPI unless specifically indicated above     Objective:    BP (!) 167/71   Pulse 76   Temp 98 F (36.7 C)   Wt 116 lb (52.6 kg)   LMP  (LMP Unknown)   SpO2 97%   BMI 23.43 kg/m   Wt Readings from Last 3 Encounters:  09/11/20 116 lb (52.6 kg)  08/31/20 116 lb 3.2 oz (52.7 kg)  05/29/20 114 lb (51.7 kg)    Physical Exam Vitals and nursing note reviewed.  Constitutional:      General: She is not in acute distress.    Appearance: Normal appearance. She is not ill-appearing, toxic-appearing or diaphoretic.  HENT:     Head: Normocephalic and atraumatic.     Right Ear: External ear normal.     Left Ear: External ear normal.     Nose: Nose normal.      Mouth/Throat:     Mouth: Mucous membranes are moist.     Pharynx: Oropharynx is clear.  Eyes:     General: No scleral icterus.       Right eye: No discharge.        Left eye: No discharge.     Extraocular Movements: Extraocular movements intact.     Conjunctiva/sclera: Conjunctivae normal.     Pupils: Pupils are equal, round, and reactive to light.  Cardiovascular:     Rate and Rhythm: Normal rate and regular rhythm.     Pulses: Normal pulses.     Heart sounds: Normal heart sounds. No murmur heard.   No friction rub. No gallop.  Pulmonary:     Effort: Pulmonary effort is normal. No respiratory distress.     Breath sounds: Normal breath sounds. No stridor. No wheezing, rhonchi or rales.  Chest:     Chest wall: No tenderness.  Musculoskeletal:        General: Normal range of motion.     Cervical back: Normal range of motion and neck supple.  Skin:    General: Skin is warm and dry.     Capillary Refill: Capillary refill takes less than 2 seconds.     Coloration: Skin is not jaundiced or pale.     Findings: No bruising, erythema, lesion or  rash.  Neurological:     General: No focal deficit present.     Mental Status: She is alert and oriented to person, place, and time. Mental status is at baseline.  Psychiatric:        Mood and Affect: Mood normal.        Behavior: Behavior normal.        Thought Content: Thought content normal.        Judgment: Judgment normal.    Results for orders placed or performed in visit on 08/31/20  CBC with Differential/Platelet  Result Value Ref Range   WBC 5.9 3.4 - 10.8 x10E3/uL   RBC 4.03 3.77 - 5.28 x10E6/uL   Hemoglobin 12.4 11.1 - 15.9 g/dL   Hematocrit 37.3 34.0 - 46.6 %   MCV 93 79 - 97 fL   MCH 30.8 26.6 - 33.0 pg   MCHC 33.2 31.5 - 35.7 g/dL   RDW 13.7 11.7 - 15.4 %   Platelets 229 150 - 450 x10E3/uL   Neutrophils 52 Not Estab. %   Lymphs 37 Not Estab. %   Monocytes 8 Not Estab. %   Eos 2 Not Estab. %   Basos 1 Not Estab. %    Neutrophils Absolute 3.1 1.4 - 7.0 x10E3/uL   Lymphocytes Absolute 2.2 0.7 - 3.1 x10E3/uL   Monocytes Absolute 0.5 0.1 - 0.9 x10E3/uL   EOS (ABSOLUTE) 0.1 0.0 - 0.4 x10E3/uL   Basophils Absolute 0.1 0.0 - 0.2 x10E3/uL   Immature Granulocytes 0 Not Estab. %   Immature Grans (Abs) 0.0 0.0 - 0.1 x10E3/uL  Comprehensive metabolic panel  Result Value Ref Range   Glucose 88 65 - 99 mg/dL   BUN 9 8 - 27 mg/dL   Creatinine, Ser 0.85 0.57 - 1.00 mg/dL   eGFR 73 >59 mL/min/1.73   BUN/Creatinine Ratio 11 (L) 12 - 28   Sodium 128 (L) 134 - 144 mmol/L   Potassium 4.1 3.5 - 5.2 mmol/L   Chloride 88 (L) 96 - 106 mmol/L   CO2 27 20 - 29 mmol/L   Calcium 9.8 8.7 - 10.3 mg/dL   Total Protein 6.9 6.0 - 8.5 g/dL   Albumin 4.4 3.7 - 4.7 g/dL   Globulin, Total 2.5 1.5 - 4.5 g/dL   Albumin/Globulin Ratio 1.8 1.2 - 2.2   Bilirubin Total 0.5 0.0 - 1.2 mg/dL   Alkaline Phosphatase 100 44 - 121 IU/L   AST 25 0 - 40 IU/L   ALT 18 0 - 32 IU/L  Lipid Panel w/o Chol/HDL Ratio  Result Value Ref Range   Cholesterol, Total 135 100 - 199 mg/dL   Triglycerides 77 0 - 149 mg/dL   HDL 65 >39 mg/dL   VLDL Cholesterol Cal 15 5 - 40 mg/dL   LDL Chol Calc (NIH) 55 0 - 99 mg/dL  Microalbumin, Urine Waived  Result Value Ref Range   Microalb, Ur Waived 30 (H) 0 - 19 mg/L   Creatinine, Urine Waived 200 10 - 300 mg/dL   Microalb/Creat Ratio <30 <30 mg/g      Assessment & Plan:   Problem List Items Addressed This Visit       Cardiovascular and Mediastinum   Benign hypertension with chronic kidney disease - Primary    Running high since coming off HCTZ. Will increase her losartan to 92m and recheck 2 weeks. Call with any concerns.        Relevant Medications   losartan (COZAAR) 50 MG tablet   Other  Relevant Orders   Basic metabolic panel     Other   Hyponatremia    Rechecking labs after stopping HCTZ. Await results.         Follow up plan: Return in about 2 weeks (around 09/25/2020).

## 2020-09-12 LAB — BASIC METABOLIC PANEL
BUN/Creatinine Ratio: 15 (ref 12–28)
BUN: 12 mg/dL (ref 8–27)
CO2: 25 mmol/L (ref 20–29)
Calcium: 9.8 mg/dL (ref 8.7–10.3)
Chloride: 96 mmol/L (ref 96–106)
Creatinine, Ser: 0.82 mg/dL (ref 0.57–1.00)
Glucose: 108 mg/dL — ABNORMAL HIGH (ref 65–99)
Potassium: 3.9 mmol/L (ref 3.5–5.2)
Sodium: 135 mmol/L (ref 134–144)
eGFR: 76 mL/min/{1.73_m2} (ref >59)

## 2020-09-28 ENCOUNTER — Encounter: Payer: Self-pay | Admitting: Family Medicine

## 2020-09-28 ENCOUNTER — Other Ambulatory Visit: Payer: Self-pay

## 2020-09-28 ENCOUNTER — Ambulatory Visit (INDEPENDENT_AMBULATORY_CARE_PROVIDER_SITE_OTHER): Payer: Medicare Other | Admitting: Family Medicine

## 2020-09-28 VITALS — BP 137/67 | HR 89 | Temp 98.7°F | Wt 114.8 lb

## 2020-09-28 DIAGNOSIS — B370 Candidal stomatitis: Secondary | ICD-10-CM | POA: Diagnosis not present

## 2020-09-28 DIAGNOSIS — I129 Hypertensive chronic kidney disease with stage 1 through stage 4 chronic kidney disease, or unspecified chronic kidney disease: Secondary | ICD-10-CM | POA: Diagnosis not present

## 2020-09-28 DIAGNOSIS — E871 Hypo-osmolality and hyponatremia: Secondary | ICD-10-CM | POA: Diagnosis not present

## 2020-09-28 MED ORDER — NYSTATIN 100000 UNIT/ML MT SUSP: 5.0000 mL | Freq: Four times a day (QID) | OROMUCOSAL | 0 refills | Status: DC

## 2020-09-28 MED ORDER — LOSARTAN POTASSIUM 100 MG PO TABS: 100.0000 mg | ORAL_TABLET | Freq: Every day | ORAL | 1 refills | Status: DC

## 2020-09-28 NOTE — Assessment & Plan Note (Signed)
Rechecking labs today. Await results. Treat as needed.  °

## 2020-09-28 NOTE — Progress Notes (Signed)
BP 137/67   Pulse 89   Temp 98.7 F (37.1 C) (Oral)   Wt 114 lb 12.8 oz (52.1 kg)   LMP  (LMP Unknown)   SpO2 94%   BMI 23.19 kg/m    Subjective:    Patient ID: Kristi Clark, female    DOB: 17-May-1950, 70 y.o.   MRN: [id number]  HPI: Kristi Clark is a 70 y.o. female  Chief Complaint  Patient presents with   Hypertension    2 week f/up   HYPERTENSION Hypertension status: running high at home  Satisfied with current treatment? yes Duration of hypertension: chronic BP monitoring frequency:  a few times a week BP range: 150s-170s BP medication side effects:  no Medication compliance: excellent compliance Previous BP meds:metoprolol, imdur, losartan Aspirin: yes Recurrent headaches: yes Visual changes: no Palpitations: no Dyspnea: no Chest pain: no Lower extremity edema: no Dizzy/lightheaded: no  Relevant past medical, surgical, family and social history reviewed and updated as indicated. Interim medical history since our last visit reviewed. Allergies and medications reviewed and updated.  Review of Systems  Constitutional: Negative.   Respiratory: Negative.    Cardiovascular: Negative.   Gastrointestinal: Negative.   Musculoskeletal: Negative.   Neurological: Negative.   Psychiatric/Behavioral: Negative.     Per HPI unless specifically indicated above     Objective:    BP 137/67   Pulse 89   Temp 98.7 F (37.1 C) (Oral)   Wt 114 lb 12.8 oz (52.1 kg)   LMP  (LMP Unknown)   SpO2 94%   BMI 23.19 kg/m   Wt Readings from Last 3 Encounters:  09/28/20 114 lb 12.8 oz (52.1 kg)  09/11/20 116 lb (52.6 kg)  08/31/20 116 lb 3.2 oz (52.7 kg)    Physical Exam Vitals and nursing note reviewed.  Constitutional:      General: She is not in acute distress.    Appearance: Normal appearance. She is not ill-appearing, toxic-appearing or diaphoretic.  HENT:     Head: Normocephalic and atraumatic.     Right Ear: External ear normal.     Left Ear:  External ear normal.     Nose: Nose normal.     Mouth/Throat:     Mouth: Mucous membranes are moist.     Pharynx: Oropharyngeal exudate present.  Eyes:     General: No scleral icterus.       Right eye: No discharge.        Left eye: No discharge.     Extraocular Movements: Extraocular movements intact.     Conjunctiva/sclera: Conjunctivae normal.     Pupils: Pupils are equal, round, and reactive to light.  Cardiovascular:     Rate and Rhythm: Normal rate and regular rhythm.     Pulses: Normal pulses.     Heart sounds: Normal heart sounds. No murmur heard.   No friction rub. No gallop.  Pulmonary:     Effort: Pulmonary effort is normal. No respiratory distress.     Breath sounds: Normal breath sounds. No stridor. No wheezing, rhonchi or rales.  Chest:     Chest wall: No tenderness.  Musculoskeletal:        General: Normal range of motion.     Cervical back: Normal range of motion and neck supple.  Skin:    General: Skin is warm and dry.     Capillary Refill: Capillary refill takes less than 2 seconds.     Coloration: Skin is not jaundiced or pale.  Findings: No bruising, erythema, lesion or rash.  Neurological:     General: No focal deficit present.     Mental Status: She is alert and oriented to person, place, and time. Mental status is at baseline.  Psychiatric:        Mood and Affect: Mood normal.        Behavior: Behavior normal.        Thought Content: Thought content normal.        Judgment: Judgment normal.    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 Will increase her losartan to 162m and recheck 1 month. Call with any concerns.        Relevant Medications   losartan (COZAAR) 100 MG tablet   Other Relevant Orders   Basic metabolic panel     Other   Hyponatremia    Rechecking labs today. Await results. Treat as needed.        Relevant Orders   Basic metabolic panel   Other Visit Diagnoses     Thrush       Will treat nystatin. Call with any concerns.    Relevant Medications   nystatin (MYCOSTATIN) 100000 UNIT/ML suspension        Follow up plan: Return in about 6 weeks (around 11/09/2020) for follow up BP.

## 2020-09-28 NOTE — Assessment & Plan Note (Signed)
Running high at home. Will increase her losartan to '100mg'$  and recheck 1 month. Call with any concerns.

## 2020-09-29 LAB — BASIC METABOLIC PANEL
BUN/Creatinine Ratio: 11 — ABNORMAL LOW (ref 12–28)
BUN: 10 mg/dL (ref 8–27)
CO2: 28 mmol/L (ref 20–29)
Calcium: 9.6 mg/dL (ref 8.7–10.3)
Chloride: 96 mmol/L (ref 96–106)
Creatinine, Ser: 0.89 mg/dL (ref 0.57–1.00)
Glucose: 89 mg/dL (ref 65–99)
Potassium: 3.9 mmol/L (ref 3.5–5.2)
Sodium: 135 mmol/L (ref 134–144)
eGFR: 69 mL/min/{1.73_m2} (ref >59)

## 2020-10-08 ENCOUNTER — Other Ambulatory Visit: Payer: Self-pay | Admitting: Family Medicine

## 2020-10-08 DIAGNOSIS — Z1231 Encounter for screening mammogram for malignant neoplasm of breast: Secondary | ICD-10-CM

## 2020-10-27 DIAGNOSIS — I2510 Atherosclerotic heart disease of native coronary artery without angina pectoris: Secondary | ICD-10-CM | POA: Diagnosis not present

## 2020-10-27 DIAGNOSIS — E782 Mixed hyperlipidemia: Secondary | ICD-10-CM | POA: Diagnosis not present

## 2020-10-27 DIAGNOSIS — I10 Essential (primary) hypertension: Secondary | ICD-10-CM | POA: Diagnosis not present

## 2020-10-27 DIAGNOSIS — F17210 Nicotine dependence, cigarettes, uncomplicated: Secondary | ICD-10-CM | POA: Diagnosis not present

## 2020-10-27 DIAGNOSIS — R0602 Shortness of breath: Secondary | ICD-10-CM | POA: Diagnosis not present

## 2020-10-28 ENCOUNTER — Encounter: Payer: Self-pay | Admitting: Family Medicine

## 2020-11-10 ENCOUNTER — Telehealth: Payer: Self-pay

## 2020-11-10 ENCOUNTER — Encounter: Payer: Self-pay | Admitting: Family Medicine

## 2020-11-10 ENCOUNTER — Ambulatory Visit (INDEPENDENT_AMBULATORY_CARE_PROVIDER_SITE_OTHER): Payer: Medicare Other | Admitting: Family Medicine

## 2020-11-10 ENCOUNTER — Other Ambulatory Visit: Payer: Self-pay

## 2020-11-10 VITALS — BP 136/66 | HR 82 | Temp 97.9°F | Ht <= 58 in | Wt 114.4 lb

## 2020-11-10 DIAGNOSIS — I129 Hypertensive chronic kidney disease with stage 1 through stage 4 chronic kidney disease, or unspecified chronic kidney disease: Secondary | ICD-10-CM | POA: Diagnosis not present

## 2020-11-10 NOTE — Telephone Encounter (Signed)
Rx was faxed over to [facility].  Notes faxed over to Dr.Khan's office at [phone].

## 2020-11-10 NOTE — Progress Notes (Signed)
BP 136/66   Pulse 82   Temp 97.9 F (36.6 C) (Oral)   Ht 4' 9.87" (1.47 m)   LMP  (LMP Unknown)   SpO2 96%   BMI 24.10 kg/m    Subjective:    Patient ID: Kristi Clark, female    DOB: [birth year], 70 y.o.   MRN: [id number]  HPI: Kristi Clark is a 70 y.o. female  Chief Complaint  Patient presents with   Hypertension   HYPERTENSION Hypertension status: stable  Satisfied with current treatment? no Duration of hypertension: chronic BP monitoring frequency:  a couple of times a day BP range: 110s-150s- Home BP cuff checked today and running 20+ points high BP medication side effects:  no Medication compliance: excellent compliance Previous BP meds:amlodipine 56m,  Aspirin: no Recurrent headaches: no Visual changes: no Palpitations: no Dyspnea: no Chest pain: no Lower extremity edema: no Dizzy/lightheaded: no  Relevant past medical, surgical, family and social history reviewed and updated as indicated. Interim medical history since our last visit reviewed. Allergies and medications reviewed and updated.  Review of Systems  Constitutional: Negative.   Respiratory: Negative.    Cardiovascular: Negative.   Gastrointestinal: Negative.   Musculoskeletal: Negative.   Psychiatric/Behavioral: Negative.     Per HPI unless specifically indicated above     Objective:    BP 136/66   Pulse 82   Temp 97.9 F (36.6 C) (Oral)   Ht 4' 9.87" (1.47 m)   LMP  (LMP Unknown)   SpO2 96%   BMI 24.10 kg/m   Wt Readings from Last 3 Encounters:  09/28/20 114 lb 12.8 oz (52.1 kg)  09/11/20 116 lb (52.6 kg)  08/31/20 116 lb 3.2 oz (52.7 kg)    Physical Exam Vitals and nursing note reviewed.  Constitutional:      General: She is not in acute distress.    Appearance: Normal appearance. She is not ill-appearing, toxic-appearing or diaphoretic.  HENT:     Head: Normocephalic and atraumatic.     Right Ear: External ear normal.     Left Ear: External ear normal.      Nose: Nose normal.     Mouth/Throat:     Mouth: Mucous membranes are moist.     Pharynx: Oropharynx is clear.  Eyes:     General: No scleral icterus.       Right eye: No discharge.        Left eye: No discharge.     Extraocular Movements: Extraocular movements intact.     Conjunctiva/sclera: Conjunctivae normal.     Pupils: Pupils are equal, round, and reactive to light.  Cardiovascular:     Rate and Rhythm: Normal rate and regular rhythm.     Pulses: Normal pulses.     Heart sounds: Normal heart sounds. No murmur heard.   No friction rub. No gallop.  Pulmonary:     Effort: Pulmonary effort is normal. No respiratory distress.     Breath sounds: Normal breath sounds. No stridor. No wheezing, rhonchi or rales.  Chest:     Chest wall: No tenderness.  Musculoskeletal:        General: Normal range of motion.     Cervical back: Normal range of motion and neck supple.  Skin:    General: Skin is warm and dry.     Capillary Refill: Capillary refill takes less than 2 seconds.     Coloration: Skin is not jaundiced or pale.     Findings: No bruising,  erythema, lesion or rash.  Neurological:     General: No focal deficit present.     Mental Status: She is alert and oriented to person, place, and time. Mental status is at baseline.  Psychiatric:        Mood and Affect: Mood normal.        Behavior: Behavior normal.        Thought Content: Thought content normal.        Judgment: Judgment normal.    Results for orders placed or performed in visit on 97/58/83  Basic metabolic panel  Result Value Ref Range   Glucose 89 65 - 99 mg/dL   BUN 10 8 - 27 mg/dL   Creatinine, Ser 0.89 0.57 - 1.00 mg/dL   eGFR 69 >59 mL/min/1.73   BUN/Creatinine Ratio 11 (L) 12 - 28   Sodium 135 134 - 144 mmol/L   Potassium 3.9 3.5 - 5.2 mmol/L   Chloride 96 96 - 106 mmol/L   CO2 28 20 - 29 mmol/L   Calcium 9.6 8.7 - 10.3 mg/dL      Assessment & Plan:   Problem List Items Addressed This Visit        Cardiovascular and Mediastinum   Benign hypertension with chronic kidney disease - Primary    Home blood pressure cuff is running 24 points systolic high. Has been very tired at home. Diastolic running low- will stop amlodipine and continue other meds. Will get new bp cuff. Recheck 1 month. Seeing cardiology before that. Will make sure he's aware that BP cuff was inaccurate and that we stopped amlodipine. May need to go back on 2.58m amlodipine.        Relevant Orders   Basic metabolic panel     Follow up plan: Return in about 4 weeks (around 12/08/2020).

## 2020-11-10 NOTE — Assessment & Plan Note (Signed)
Home blood pressure cuff is running 24 points systolic high. Has been very tired at home. Diastolic running low- will stop amlodipine and continue other meds. Will get new bp cuff. Recheck 1 month. Seeing cardiology before that. Will make sure he's aware that BP cuff was inaccurate and that we stopped amlodipine. May need to go back on 2.'5mg'$  amlodipine.

## 2020-11-10 NOTE — Telephone Encounter (Signed)
-----   Message from [name], DO sent at 11/10/2020  1:30 PM EDT ----- BP cuff Rx and please send note when complete to Dr. Humphrey [facility] cardiology)

## 2020-11-11 LAB — BASIC METABOLIC PANEL
BUN/Creatinine Ratio: 11 — ABNORMAL LOW (ref 12–28)
BUN: 11 mg/dL (ref 8–27)
CO2: 27 mmol/L (ref 20–29)
Calcium: 10 mg/dL (ref 8.7–10.3)
Chloride: 101 mmol/L (ref 96–106)
Creatinine, Ser: 0.98 mg/dL (ref 0.57–1.00)
Glucose: 91 mg/dL (ref 65–99)
Potassium: 4 mmol/L (ref 3.5–5.2)
Sodium: 140 mmol/L (ref 134–144)
eGFR: 62 mL/min/{1.73_m2} (ref >59)

## 2020-11-12 DIAGNOSIS — R0602 Shortness of breath: Secondary | ICD-10-CM | POA: Diagnosis not present

## 2020-11-16 ENCOUNTER — Other Ambulatory Visit: Payer: Self-pay

## 2020-11-16 ENCOUNTER — Ambulatory Visit
Admission: RE | Admit: 2020-11-16 | Discharge: 2020-11-16 | Disposition: A | Discharge status: Home / Self Care | Payer: Medicare Other | Source: Ambulatory Visit | Attending: Family Medicine | Admitting: Family Medicine

## 2020-11-16 DIAGNOSIS — Z1231 Encounter for screening mammogram for malignant neoplasm of breast: Secondary | ICD-10-CM | POA: Diagnosis not present

## 2020-11-17 DIAGNOSIS — I10 Essential (primary) hypertension: Secondary | ICD-10-CM | POA: Diagnosis not present

## 2020-11-17 DIAGNOSIS — E782 Mixed hyperlipidemia: Secondary | ICD-10-CM | POA: Diagnosis not present

## 2020-11-17 DIAGNOSIS — Z9861 Coronary angioplasty status: Secondary | ICD-10-CM | POA: Diagnosis not present

## 2020-11-17 DIAGNOSIS — F17200 Nicotine dependence, unspecified, uncomplicated: Secondary | ICD-10-CM | POA: Diagnosis not present

## 2020-11-17 DIAGNOSIS — I2510 Atherosclerotic heart disease of native coronary artery without angina pectoris: Secondary | ICD-10-CM | POA: Diagnosis not present

## 2020-11-17 DIAGNOSIS — J449 Chronic obstructive pulmonary disease, unspecified: Secondary | ICD-10-CM | POA: Diagnosis not present

## 2020-11-18 NOTE — Progress Notes (Signed)
Contacted via MyChart   Good evening Kristi Clark, mammogram returned nice and normal. Repeat in one year:)

## 2020-11-21 ENCOUNTER — Encounter: Payer: Self-pay | Admitting: Family Medicine

## 2020-11-23 ENCOUNTER — Encounter: Payer: Self-pay | Admitting: Internal Medicine

## 2020-11-23 ENCOUNTER — Telehealth (INDEPENDENT_AMBULATORY_CARE_PROVIDER_SITE_OTHER): Payer: Medicare Other | Admitting: Internal Medicine

## 2020-11-23 ENCOUNTER — Other Ambulatory Visit: Payer: Self-pay

## 2020-11-23 DIAGNOSIS — U071 COVID-19: Secondary | ICD-10-CM | POA: Insufficient documentation

## 2020-11-23 NOTE — Telephone Encounter (Signed)
Pt has been scheduled for today with Dr. Neomia Dear at 2:20 pm

## 2020-11-23 NOTE — Progress Notes (Signed)
LMP  (LMP Unknown)    Subjective:    Patient ID: Kristi Clark, female    DOB: 1950-05-10, 70 y.o.   MRN: [id number]  No chief complaint on file.   HPI: Kristi Clark is a 70 y.o. female   This visit was completed via telephone due to the restrictions of the COVID-19 pandemic. All issues as above were discussed and addressed but no physical exam was performed. If it was felt that the patient should be evaluated in the office, they were directed there. The patient verbally consented to this visit. Patient was unable to complete an audio/visual visit due to Technical difficulties. Due to the catastrophic nature of the COVID-19 pandemic, this visit was done through audio contact only. Location of the patient: home Location of the provider: work Those involved with this call:  Provider: Charlynne Cousins, MD CMA: Yvonna Alanis, [place] Desk/Registration: Barth Kirks  Time spent on call: 10 minutes on the phone discussing health concerns. 10 minutes total spent in review of patient's record and preparation of their chart. Hasnt been feeling good since Wednesday  - feels like she had a cold - covid test Friday took 2 home tests. COVID vaccines uptodate and booster x 1 too. Head congestion, little cough, no fever - temp - 99 F. Has tyelnol q 4-6 hrly   URI  This is a new (had some arm pain ? athritis in [place]) problem. The current episode started in the past 7 days (has been feeling better already, just fatigued). There has been no fever. Associated symptoms include congestion, coughing, rhinorrhea and a sore throat. Pertinent negatives include no abdominal pain, diarrhea, dysuria, ear pain, headaches, joint pain, joint swelling, nausea, neck pain, plugged ear sensation, rash, sinus pain, sneezing, swollen glands or vomiting.   No chief complaint on file.   Relevant past medical, surgical, family and social history reviewed and updated as indicated. Interim medical history  since our last visit reviewed. Allergies and medications reviewed and updated.  Review of Systems  HENT:  Positive for congestion, rhinorrhea and sore throat. Negative for ear pain, sinus pain and sneezing.   Respiratory:  Positive for cough.   Gastrointestinal:  Negative for abdominal pain, diarrhea, nausea and vomiting.  Genitourinary:  Negative for dysuria.  Musculoskeletal:  Negative for joint pain and neck pain.  Skin:  Negative for rash.  Neurological:  Negative for headaches.   Per HPI unless specifically indicated above     Objective:    LMP  (LMP Unknown)   Wt Readings from Last 3 Encounters:  11/10/20 114 lb 6.4 oz (51.9 kg)  09/28/20 114 lb 12.8 oz (52.1 kg)  09/11/20 116 lb (52.6 kg)    Physical Exam  Unable to peform sec to virtual visit.   Results for orders placed or performed in visit on 54/65/68  Basic metabolic panel  Result Value Ref Range   Glucose 91 65 - 99 mg/dL   BUN 11 8 - 27 mg/dL   Creatinine, Ser 0.98 0.57 - 1.00 mg/dL   eGFR 62 >59 mL/min/1.73   BUN/Creatinine Ratio 11 (L) 12 - 28   Sodium 140 134 - 144 mmol/L   Potassium 4.0 3.5 - 5.2 mmol/L   Chloride 101 96 - 106 mmol/L   CO2 27 20 - 29 mmol/L   Calcium 10.0 8.7 - 10.3 mg/dL        Current Outpatient Medications:    albuterol (VENTOLIN HFA) 108 (90 Base) MCG/ACT inhaler, Inhale  2 puffs into the lungs every 6 (six) hours as needed for wheezing or shortness of breath., Disp: 18 g, Rfl: 3   aspirin 81 MG tablet, Take 81 mg by mouth daily., Disp: , Rfl:    atorvastatin (LIPITOR) 40 MG tablet, Take 1 tablet (40 mg total) by mouth daily., Disp: 90 tablet, Rfl: 1   Calcium Carbonate (CALCIUM 600 PO), Take by mouth daily., Disp: , Rfl:    Cetirizine HCl (ZYRTEC PO), Take by mouth daily., Disp: , Rfl:    clopidogrel (PLAVIX) 75 MG tablet, TAKE ONE TABLET BY MOUTH EVERY DAY, Disp: 90 tablet, Rfl: 1   diclofenac Sodium (VOLTAREN) 1 % GEL, Apply 4 g topically 4 (four) times daily. (Patient not  taking: No sig reported), Disp: 100 g, Rfl: 3   fluticasone (FLONASE) 50 MCG/ACT nasal spray, Place 2 sprays into both nostrils daily., Disp: 3 g, Rfl: 3   fluticasone-salmeterol (ADVAIR DISKUS) 250-50 MCG/ACT AEPB, Inhale 1 puff into the lungs in the morning and at bedtime., Disp: 60 each, Rfl: 3   isosorbide mononitrate (IMDUR) 60 MG 24 hr tablet, Take 1 tablet (60 mg total) by mouth daily., Disp: 90 tablet, Rfl: 1   losartan (COZAAR) 100 MG tablet, Take 1 tablet (100 mg total) by mouth daily., Disp: 90 tablet, Rfl: 1   metoprolol succinate (TOPROL-XL) 50 MG 24 hr tablet, TAKE ONE TABLET BY MOUTH EVERY DAY (TAKE WITH OR IMMEDIATELY FOLLOWING A MEAL), Disp: 90 tablet, Rfl: 1   Multiple Vitamins-Minerals (CENTRUM SILVER ADULT 50+ PO), Take 1 tablet by mouth daily., Disp: , Rfl:    nystatin (MYCOSTATIN) 100000 UNIT/ML suspension, Take 5 mLs (500,000 Units total) by mouth 4 (four) times daily., Disp: 60 mL, Rfl: 0   pantoprazole (PROTONIX) 40 MG tablet, TAKE ONE TABLET BY MOUTH EVERY DAY, Disp: 90 tablet, Rfl: 2   Probiotic Product (PROBIOTIC-10 PO), Take by mouth. Every other day, Disp: , Rfl:    tiotropium (SPIRIVA) 18 MCG inhalation capsule, Place 1 capsule (18 mcg total) into inhaler and inhale daily., Disp: 90 capsule, Rfl: 3    Assessment & Plan:  COVID : positive : feeling better on day 6 of symptoms.  Increase fluid intake.  Headahce - tyelnol every 4-6 hrs prn and alternate this with ibubrufen 800 mg q 8 hrly. Use claritin.  5 days quarantine.  wear a mask for 5 more days can go out with a mask,   Problem List Items Addressed This Visit   None    No orders of the defined types were placed in this encounter.    No orders of the defined types were placed in this encounter.    Follow up plan: No follow-ups on file.

## 2020-11-30 ENCOUNTER — Ambulatory Visit: Payer: Self-pay

## 2020-11-30 NOTE — Telephone Encounter (Signed)
Patient called and says she's been having dizziness since Friday. She says she feels it's getting better, but she still feels fuzzy headed, staggering when falling. She says no to room spinning. She reports having COVID on 11/20/20, negative test results as of now. She says she has a cough and nasal congestion that's productive of brownish, nasty colored sputum. She denies SOB, reports feeling tired, fuzzy vision, no appetite, lack of energy. Advised no availability with PCP, MyChart Video Visit scheduled for tomorrow at 1620 with Dr. Neomia Dear, care advice given, patient verbalized understanding.  Reason for Disposition  [1] MILD dizziness (e.g., walking normally) AND [2] has NOT been evaluated by physician for this  (Exception: dizziness caused by heat exposure, sudden standing, or poor fluid intake)  Answer Assessment - Initial Assessment Questions 1. DESCRIPTION: "Describe your dizziness."     Fuzzy head, staggering 2. LIGHTHEADED: "Do you feel lightheaded?" (e.g., somewhat faint, woozy, weak upon standing)     Yes 3. VERTIGO: "Do you feel like either you or the room is spinning or tilting?" (i.e. vertigo)     No 4. SEVERITY: "How bad is it?"  "Do you feel like you are going to faint?" "Can you stand and walk?"   - MILD: Feels slightly dizzy, but walking normally.   - MODERATE: Feels unsteady when walking, but not falling; interferes with normal activities (e.g., school, work).   - SEVERE: Unable to walk without falling, or requires assistance to walk without falling; feels like passing out now.      Mild 5. ONSET:  "When did the dizziness begin?"     Friday morning 6. AGGRAVATING FACTORS: "Does anything make it worse?" (e.g., standing, change in head position)     Not really 7. HEART RATE: "Can you tell me your heart rate?" "How many beats in 15 seconds?"  (Note: not all patients can do this)       N/A 8. CAUSE: "What do you think is causing the dizziness?"     I don't know unless my BP is  up 9. RECURRENT SYMPTOM: "Have you had dizziness before?" If Yes, ask: "When was the last time?" "What happened that time?"     No 10. OTHER SYMPTOMS: "Do you have any other symptoms?" (e.g., fever, chest pain, vomiting, diarrhea, bleeding)       No appetite, lack of energy, fuzzy vision, fatigue, coughing (nasty looking color phlegm, brownish color), nasal stuffiness (nasty looking color) 11. PREGNANCY: "Is there any chance you are pregnant?" "When was your last menstrual period?"       No  Protocols used: Dizziness - Lightheadedness-A-AH

## 2020-12-01 ENCOUNTER — Telehealth: Payer: Medicare Other | Admitting: Internal Medicine

## 2020-12-09 ENCOUNTER — Telehealth: Payer: Self-pay

## 2020-12-09 ENCOUNTER — Other Ambulatory Visit: Payer: Self-pay

## 2020-12-09 ENCOUNTER — Ambulatory Visit (INDEPENDENT_AMBULATORY_CARE_PROVIDER_SITE_OTHER): Payer: Medicare Other | Admitting: Family Medicine

## 2020-12-09 ENCOUNTER — Encounter: Payer: Self-pay | Admitting: Family Medicine

## 2020-12-09 VITALS — BP 162/56 | HR 77 | Temp 98.5°F | Ht 58.47 in | Wt 114.4 lb

## 2020-12-09 DIAGNOSIS — K5900 Constipation, unspecified: Secondary | ICD-10-CM | POA: Diagnosis not present

## 2020-12-09 DIAGNOSIS — I129 Hypertensive chronic kidney disease with stage 1 through stage 4 chronic kidney disease, or unspecified chronic kidney disease: Secondary | ICD-10-CM | POA: Diagnosis not present

## 2020-12-09 DIAGNOSIS — Z23 Encounter for immunization: Secondary | ICD-10-CM

## 2020-12-09 MED ORDER — BLOOD PRESSURE MONITOR/WRIST KIT: PACK | 0 refills | Status: DC

## 2020-12-09 MED ORDER — AMLODIPINE BESYLATE 2.5 MG PO TABS: 2.5000 mg | ORAL_TABLET | Freq: Every day | ORAL | 1 refills | Status: DC

## 2020-12-09 MED ORDER — POLYETHYLENE GLYCOL 3350 17 GM/SCOOP PO POWD: 17.0000 g | Freq: Every day | ORAL | 1 refills | Status: DC | PRN

## 2020-12-09 MED ORDER — INDAPAMIDE 2.5 MG PO TABS: 2.5000 mg | ORAL_TABLET | Freq: Every day | ORAL | 2 refills | Status: DC

## 2020-12-09 NOTE — Assessment & Plan Note (Signed)
BP running high. Will start indapamide and recheck 1 month. Call with any concerns. Continue to monitor.

## 2020-12-09 NOTE — Progress Notes (Signed)
BP (!) 162/56   Pulse 77   Temp 98.5 F (36.9 C) (Oral)   Ht 4' 10.47" (1.485 m)   Wt 114 lb 6.4 oz (51.9 kg)   LMP  (LMP Unknown)   SpO2 95%   BMI 23.53 kg/m    Subjective:    Patient ID: Kristi Clark, female    DOB: May 27, 1950, 70 y.o.   MRN: [id number]  HPI: Kristi Clark is a 70 y.o. female  Chief Complaint  Patient presents with   Hypertension   Having some stomach pain    Patient states that her stomach problem since last week. She states that she eats a lot but does not have bowel movements.    Chronic Kidney Disease   HYPERTENSION Hypertension status: stable  Satisfied with current treatment? yes Duration of hypertension: chronic BP monitoring frequency:  rarely BP medication side effects:  no Medication compliance: excellent compliance Previous BP meds: metoprolol, losartan, imdur Aspirin: yes Recurrent headaches: no Visual changes: no Palpitations: no Dyspnea: no Chest pain: no Lower extremity edema: no Dizzy/lightheaded: no   Relevant past medical, surgical, family and social history reviewed and updated as indicated. Interim medical history since our last visit reviewed. Allergies and medications reviewed and updated.  Review of Systems  Constitutional: Negative.   Respiratory: Negative.    Cardiovascular: Negative.   Gastrointestinal:  Positive for constipation. Negative for abdominal distention, abdominal pain, anal bleeding, blood in stool, diarrhea, nausea, rectal pain and vomiting.  Musculoskeletal:  Positive for arthralgias and myalgias. Negative for back pain, gait problem, joint swelling, neck pain and neck stiffness.  Skin: Negative.   Neurological: Negative.   Psychiatric/Behavioral: Negative.     Per HPI unless specifically indicated above     Objective:    BP (!) 162/56   Pulse 77   Temp 98.5 F (36.9 C) (Oral)   Ht 4' 10.47" (1.485 m)   Wt 114 lb 6.4 oz (51.9 kg)   LMP  (LMP Unknown)   SpO2 95%   BMI 23.53  kg/m   Wt Readings from Last 3 Encounters:  12/09/20 114 lb 6.4 oz (51.9 kg)  11/10/20 114 lb 6.4 oz (51.9 kg)  09/28/20 114 lb 12.8 oz (52.1 kg)    Physical Exam Vitals and nursing note reviewed.  Constitutional:      General: She is not in acute distress.    Appearance: Normal appearance. She is not ill-appearing, toxic-appearing or diaphoretic.  HENT:     Head: Normocephalic and atraumatic.     Right Ear: External ear normal.     Left Ear: External ear normal.     Nose: Nose normal.     Mouth/Throat:     Mouth: Mucous membranes are moist.     Pharynx: Oropharynx is clear.  Eyes:     General: No scleral icterus.       Right eye: No discharge.        Left eye: No discharge.     Extraocular Movements: Extraocular movements intact.     Conjunctiva/sclera: Conjunctivae normal.     Pupils: Pupils are equal, round, and reactive to light.  Cardiovascular:     Rate and Rhythm: Normal rate and regular rhythm.     Pulses: Normal pulses.     Heart sounds: Normal heart sounds. No murmur heard.   No friction rub. No gallop.  Pulmonary:     Effort: Pulmonary effort is normal. No respiratory distress.     Breath sounds: Normal breath  sounds. No stridor. No wheezing, rhonchi or rales.  Chest:     Chest wall: No tenderness.  Musculoskeletal:        General: Normal range of motion.     Cervical back: Normal range of motion and neck supple.  Skin:    General: Skin is warm and dry.     Capillary Refill: Capillary refill takes less than 2 seconds.     Coloration: Skin is not jaundiced or pale.     Findings: No bruising, erythema, lesion or rash.  Neurological:     General: No focal deficit present.     Mental Status: She is alert and oriented to person, place, and time. Mental status is at baseline.  Psychiatric:        Mood and Affect: Mood normal.        Behavior: Behavior normal.        Thought Content: Thought content normal.        Judgment: Judgment normal.    Results for  orders placed or performed in visit on 40/37/09  Basic metabolic panel  Result Value Ref Range   Glucose 91 65 - 99 mg/dL   BUN 11 8 - 27 mg/dL   Creatinine, Ser 0.98 0.57 - 1.00 mg/dL   eGFR 62 >59 mL/min/1.73   BUN/Creatinine Ratio 11 (L) 12 - 28   Sodium 140 134 - 144 mmol/L   Potassium 4.0 3.5 - 5.2 mmol/L   Chloride 101 96 - 106 mmol/L   CO2 27 20 - 29 mmol/L   Calcium 10.0 8.7 - 10.3 mg/dL      Assessment & Plan:   Problem List Items Addressed This Visit       Cardiovascular and Mediastinum   Benign hypertension with chronic kidney disease - Primary    BP running high. Will start indapamide and recheck 1 month. Call with any concerns. Continue to monitor.       Relevant Medications   indapamide (LOZOL) 2.5 MG tablet   Other Visit Diagnoses     Constipation, unspecified constipation type       Will start PRN miralax. Call with any concerns. Continue to monitor.    Needs flu shot       Relevant Orders   Flu Vaccine QUAD High Dose(Fluad) (Completed)        Follow up plan: Return in about 4 weeks (around 01/06/2021).

## 2020-12-09 NOTE — Telephone Encounter (Signed)
Spoke with [employer] and informed Pharmacy rep Ebony Hail that Rx Amlodipine needed to be canceled per provider Dr.Johnson. Pharmacy verbalized understanding.

## 2020-12-10 ENCOUNTER — Ambulatory Visit: Payer: Medicare Other | Admitting: Family Medicine

## 2020-12-15 ENCOUNTER — Encounter: Payer: Self-pay | Admitting: Family Medicine

## 2020-12-16 DIAGNOSIS — R918 Other nonspecific abnormal finding of lung field: Secondary | ICD-10-CM | POA: Diagnosis not present

## 2020-12-16 DIAGNOSIS — M25511 Pain in right shoulder: Secondary | ICD-10-CM | POA: Diagnosis not present

## 2020-12-16 DIAGNOSIS — R0609 Other forms of dyspnea: Secondary | ICD-10-CM | POA: Diagnosis not present

## 2020-12-16 DIAGNOSIS — M542 Cervicalgia: Secondary | ICD-10-CM | POA: Diagnosis not present

## 2020-12-16 DIAGNOSIS — J849 Interstitial pulmonary disease, unspecified: Secondary | ICD-10-CM | POA: Diagnosis not present

## 2020-12-16 DIAGNOSIS — J301 Allergic rhinitis due to pollen: Secondary | ICD-10-CM | POA: Diagnosis not present

## 2020-12-16 DIAGNOSIS — M5412 Radiculopathy, cervical region: Secondary | ICD-10-CM | POA: Diagnosis not present

## 2020-12-16 DIAGNOSIS — J449 Chronic obstructive pulmonary disease, unspecified: Secondary | ICD-10-CM | POA: Diagnosis not present

## 2020-12-20 ENCOUNTER — Encounter: Payer: Self-pay | Admitting: Family Medicine

## 2020-12-21 ENCOUNTER — Other Ambulatory Visit (HOSPITAL_COMMUNITY): Payer: Self-pay | Admitting: Specialist

## 2020-12-21 ENCOUNTER — Other Ambulatory Visit: Payer: Self-pay | Admitting: Specialist

## 2020-12-21 DIAGNOSIS — J849 Interstitial pulmonary disease, unspecified: Secondary | ICD-10-CM

## 2020-12-21 DIAGNOSIS — M25511 Pain in right shoulder: Secondary | ICD-10-CM | POA: Diagnosis not present

## 2020-12-21 DIAGNOSIS — M7541 Impingement syndrome of right shoulder: Secondary | ICD-10-CM | POA: Diagnosis not present

## 2020-12-22 NOTE — Telephone Encounter (Signed)
appt

## 2020-12-24 NOTE — Telephone Encounter (Signed)
Patient states that she is feeling better and should problems still persist she will schedule an appointment prior to her next appointment on 11/02.

## 2021-01-06 ENCOUNTER — Other Ambulatory Visit: Payer: Self-pay

## 2021-01-06 ENCOUNTER — Ambulatory Visit (INDEPENDENT_AMBULATORY_CARE_PROVIDER_SITE_OTHER): Payer: Medicare Other | Admitting: Family Medicine

## 2021-01-06 ENCOUNTER — Encounter: Payer: Self-pay | Admitting: Family Medicine

## 2021-01-06 VITALS — BP 154/52 | HR 76 | Temp 98.1°F | Wt 112.6 lb

## 2021-01-06 DIAGNOSIS — Z720 Tobacco use: Secondary | ICD-10-CM | POA: Diagnosis not present

## 2021-01-06 DIAGNOSIS — S41101A Unspecified open wound of right upper arm, initial encounter: Secondary | ICD-10-CM | POA: Diagnosis not present

## 2021-01-06 DIAGNOSIS — I129 Hypertensive chronic kidney disease with stage 1 through stage 4 chronic kidney disease, or unspecified chronic kidney disease: Secondary | ICD-10-CM | POA: Diagnosis not present

## 2021-01-06 DIAGNOSIS — Z23 Encounter for immunization: Secondary | ICD-10-CM

## 2021-01-06 MED ORDER — BUPROPION HCL ER (SR) 150 MG PO TB12: ORAL_TABLET | ORAL | 3 refills | Status: DC

## 2021-01-06 MED ORDER — INDAPAMIDE 2.5 MG PO TABS: 5.0000 mg | ORAL_TABLET | Freq: Every day | ORAL | 2 refills | Status: DC

## 2021-01-06 MED ORDER — METOPROLOL SUCCINATE ER 50 MG PO TB24: 50.0000 mg | ORAL_TABLET | Freq: Every day | ORAL | 1 refills | Status: DC

## 2021-01-06 MED ORDER — CLOPIDOGREL BISULFATE 75 MG PO TABS: 75.0000 mg | ORAL_TABLET | Freq: Every day | ORAL | 1 refills | Status: DC

## 2021-01-06 NOTE — Progress Notes (Signed)
BP (!) 154/52 (BP Location: Right Arm)   Pulse 76   Temp 98.1 F (36.7 C)   Wt 112 lb 9.6 oz (51.1 kg)   LMP  (LMP Unknown)   SpO2 96%   BMI 23.16 kg/m    Subjective:    Patient ID: Kristi Clark, female    DOB: 22-Mar-1950, 70 y.o.   MRN: [id number]  HPI: Kristi Clark is a 70 y.o. female  Chief Complaint  Patient presents with   Hypertension   HYPERTENSION Hypertension status: stable  Satisfied with current treatment? no Duration of hypertension: chronic BP monitoring frequency:  not checking BP range:  BP medication side effects:  no Medication compliance: excellent compliance Previous BP meds:metoprolol, losartan, imdur, indapamide Aspirin: yes Recurrent headaches: no Visual changes: no Palpitations: no Dyspnea: no Chest pain: no Lower extremity edema: no Dizzy/lightheaded: no  SMOKING CESSATION Smoking Status: current every day smoker Smoking Amount: 2ppd Smoking Onset: 12-13yo Smoking Quit Date: not set Smoking triggers: stress Type of tobacco use: cigarettes Children in the house: yes Other household members who smoke: no Treatments attempted: chantix Pneumovax: UTD  Relevant past medical, surgical, family and social history reviewed and updated as indicated. Interim medical history since our last visit reviewed. Allergies and medications reviewed and updated.  Review of Systems  Constitutional: Negative.   Respiratory: Negative.    Cardiovascular: Negative.   Gastrointestinal: Negative.   Musculoskeletal: Negative.   Neurological: Negative.   Psychiatric/Behavioral: Negative.     Per HPI unless specifically indicated above     Objective:    BP (!) 154/52 (BP Location: Right Arm)   Pulse 76   Temp 98.1 F (36.7 C)   Wt 112 lb 9.6 oz (51.1 kg)   LMP  (LMP Unknown)   SpO2 96%   BMI 23.16 kg/m   Wt Readings from Last 3 Encounters:  01/06/21 112 lb 9.6 oz (51.1 kg)  12/09/20 114 lb 6.4 oz (51.9 kg)  11/10/20 114 lb 6.4 oz  (51.9 kg)    Physical Exam Vitals and nursing note reviewed.  Constitutional:      General: She is not in acute distress.    Appearance: Normal appearance. She is not ill-appearing, toxic-appearing or diaphoretic.  HENT:     Head: Normocephalic and atraumatic.     Right Ear: External ear normal.     Left Ear: External ear normal.     Nose: Nose normal.     Mouth/Throat:     Mouth: Mucous membranes are moist.     Pharynx: Oropharynx is clear.  Eyes:     General: No scleral icterus.       Right eye: No discharge.        Left eye: No discharge.     Extraocular Movements: Extraocular movements intact.     Conjunctiva/sclera: Conjunctivae normal.     Pupils: Pupils are equal, round, and reactive to light.  Cardiovascular:     Rate and Rhythm: Normal rate and regular rhythm.     Pulses: Normal pulses.     Heart sounds: Normal heart sounds. No murmur heard.   No friction rub. No gallop.  Pulmonary:     Effort: Pulmonary effort is normal. No respiratory distress.     Breath sounds: Normal breath sounds. No stridor. No wheezing, rhonchi or rales.  Chest:     Chest wall: No tenderness.  Musculoskeletal:        General: Normal range of motion.     Cervical back:  Normal range of motion and neck supple.  Skin:    General: Skin is warm and dry.     Capillary Refill: Capillary refill takes less than 2 seconds.     Coloration: Skin is not jaundiced or pale.     Findings: No bruising, erythema, lesion or rash.     Comments: Well healing small scratch on R upper arm  Neurological:     General: No focal deficit present.     Mental Status: She is alert and oriented to person, place, and time. Mental status is at baseline.  Psychiatric:        Mood and Affect: Mood normal.        Behavior: Behavior normal.        Thought Content: Thought content normal.        Judgment: Judgment normal.    Results for orders placed or performed in visit on 71/69/67  Basic metabolic panel  Result Value  Ref Range   Glucose 91 65 - 99 mg/dL   BUN 11 8 - 27 mg/dL   Creatinine, Ser 0.98 0.57 - 1.00 mg/dL   eGFR 62 >59 mL/min/1.73   BUN/Creatinine Ratio 11 (L) 12 - 28   Sodium 140 134 - 144 mmol/L   Potassium 4.0 3.5 - 5.2 mmol/L   Chloride 101 96 - 106 mmol/L   CO2 27 20 - 29 mmol/L   Calcium 10.0 8.7 - 10.3 mg/dL      Assessment & Plan:   Problem List Items Addressed This Visit       Cardiovascular and Mediastinum   Benign hypertension with chronic kidney disease - Primary    Still running high systolically. Will increase her indapamide to 79m and recheck 1 month. If still high, may need to go up on her metoprolol.      Relevant Medications   indapamide (LOZOL) 2.5 MG tablet   metoprolol succinate (TOPROL-XL) 50 MG 24 hr tablet   Other Relevant Orders   Basic metabolic panel     Other   Tobacco abuse    Would like to start wellbutrin to help with smoking. Counseling given. Recheck 1 month.       Other Visit Diagnoses     Open wound of right upper arm, initial encounter       Healing well. Due for Td. Given today.   Relevant Orders   Td : Tetanus/diphtheria >7yo Preservative  free (Completed)        Follow up plan: Return in about 4 weeks (around 02/03/2021).

## 2021-01-07 ENCOUNTER — Encounter: Payer: Self-pay | Admitting: Family Medicine

## 2021-01-07 NOTE — Assessment & Plan Note (Signed)
Would like to start wellbutrin to help with smoking. Counseling given. Recheck 1 month.

## 2021-01-07 NOTE — Assessment & Plan Note (Signed)
Still running high systolically. Will increase her indapamide to 5mg  and recheck 1 month. If still high, may need to go up on her metoprolol.

## 2021-01-19 ENCOUNTER — Ambulatory Visit (INDEPENDENT_AMBULATORY_CARE_PROVIDER_SITE_OTHER): Payer: Medicare Other | Admitting: Dermatology

## 2021-01-19 ENCOUNTER — Other Ambulatory Visit: Payer: Self-pay

## 2021-01-19 DIAGNOSIS — M25511 Pain in right shoulder: Secondary | ICD-10-CM | POA: Diagnosis not present

## 2021-01-19 DIAGNOSIS — M7541 Impingement syndrome of right shoulder: Secondary | ICD-10-CM | POA: Diagnosis not present

## 2021-01-19 DIAGNOSIS — B079 Viral wart, unspecified: Secondary | ICD-10-CM

## 2021-01-19 DIAGNOSIS — D485 Neoplasm of uncertain behavior of skin: Secondary | ICD-10-CM

## 2021-01-19 NOTE — Patient Instructions (Signed)
Wound Care Instructions  Cleanse wound gently with soap and water once a day then pat dry with clean gauze. Apply a thing coat of Petrolatum (petroleum jelly, "Vaseline") over the wound (unless you have an allergy to this). We recommend that you use a new, sterile tube of Vaseline. Do not pick or remove scabs. Do not remove the yellow or white "healing tissue" from the base of the wound.  Cover the wound with fresh, clean, nonstick gauze and secure with paper tape. You may use Band-Aids in place of gauze and tape if the would is small enough, but would recommend trimming much of the tape off as there is often too much. Sometimes Band-Aids can irritate the skin.  You should call the office for your biopsy report after 1 week if you have not already been contacted.  If you experience any problems, such as abnormal amounts of bleeding, swelling, significant bruising, significant pain, or evidence of infection, please call the office immediately.  FOR ADULT SURGERY PATIENTS: If you need something for pain relief you may take 1 extra strength Tylenol (acetaminophen) AND 2 Ibuprofen (200mg each) together every 4 hours as needed for pain. (do not take these if you are allergic to them or if you have a reason you should not take them.) Typically, you may only need pain medication for 1 to 3 days.   If you have any questions or concerns for your doctor, please call our main line at [phone] and press option 4 to reach your doctor's medical assistant. If no one answers, please leave a voicemail as directed and we will return your call as soon as possible. Messages left after 4 pm will be answered the following business day.   You may also send us a message via MyChart. We typically respond to MyChart messages within 1-2 business days.  For prescription refills, please ask your pharmacy to contact our office. Our fax number is [phone].  If you have an urgent issue when the clinic is closed that  cannot wait until the next business day, you can page your doctor at the number below.    Please note that while we do our best to be available for urgent issues outside of office hours, we are not available 24/7.   If you have an urgent issue and are unable to reach us, you may choose to seek medical care at your doctor's office, retail clinic, urgent care center, or emergency room.  If you have a medical emergency, please immediately call 911 or go to the emergency department.  Pager Numbers  - Dr. Kowalski: [phone]  - Dr. Moye: [phone]  - Dr. Stewart: [phone]  In the event of inclement weather, please call our main line at [phone] for an update on the status of any delays or closures.  Dermatology Medication Tips: Please keep the boxes that topical medications come in in order to help keep track of the instructions about where and how to use these. Pharmacies typically print the medication instructions only on the boxes and not directly on the medication tubes.   If your medication is too expensive, please contact our office at [phone] option 4 or send us a message through MyChart.   We are unable to tell what your co-pay for medications will be in advance as this is different depending on your insurance coverage. However, we may be able to find a substitute medication at lower cost or fill out paperwork to get insurance to cover a needed   medication.   If a prior authorization is required to get your medication covered by your insurance company, please allow us 1-2 business days to complete this process.  Drug prices often vary depending on where the prescription is filled and some pharmacies may offer cheaper prices.  The website www.goodrx.com contains coupons for medications through different pharmacies. The prices here do not account for what the cost may be with help from insurance (it may be cheaper with your insurance), but the website can give you the  price if you did not use any insurance.  - You can print the associated coupon and take it with your prescription to the pharmacy.  - You may also stop by our office during regular business hours and pick up a GoodRx coupon card.  - If you need your prescription sent electronically to a different pharmacy, notify our office through [facility] MyChart or by phone at [phone] option 4.   

## 2021-01-19 NOTE — Progress Notes (Signed)
   Follow-Up Visit   Subjective  Kristi Clark is a 70 y.o. female who presents for the following: Skin Problem (Patient here today for a spot at left leg, present for some time but has grown. ).  The following portions of the chart were reviewed this encounter and updated as appropriate:   Tobacco  Allergies  Meds  Problems  Med Hx  Surg Hx  Fam Hx      Review of Systems:  No other skin or systemic complaints except as noted in HPI or Assessment and Plan.  Objective  Well appearing patient in no apparent distress; mood and affect are within normal limits.  A focused examination was performed including left leg. Relevant physical exam findings are noted in the Assessment and Plan.  Left Anterior Thigh 0.9 firm pink papule with cutaneous horn   Assessment & Plan  Neoplasm of uncertain behavior of skin Left Anterior Thigh  Skin / nail biopsy Type of biopsy: tangential   Informed consent: discussed and consent obtained   Timeout: patient name, date of birth, surgical site, and procedure verified   Patient was prepped and draped in usual sterile fashion: Area prepped with isopropyl alcohol. Anesthesia: the lesion was anesthetized in a standard fashion   Anesthetic:  1% lidocaine w/ epinephrine 1-100,000 buffered w/ 8.4% NaHCO3 Instrument used: flexible razor blade   Hemostasis achieved with: aluminum chloride   Outcome: patient tolerated procedure well   Post-procedure details: wound care instructions given   Additional details:  Mupirocin and a bandage applied  Specimen 1 - Surgical pathology Differential Diagnosis: r/o SCC  Check Margins: No 0.9 firm pink papule with cutaneous horn  Return for next available, [facility]SE.  Graciella Belton, RMA, am acting as scribe for Forest Gleason, MD .  Documentation: I have reviewed the above documentation for accuracy and completeness, and I agree with the above.  Forest Gleason, MD

## 2021-01-20 ENCOUNTER — Encounter: Payer: Self-pay | Admitting: Family Medicine

## 2021-01-20 ENCOUNTER — Encounter: Payer: Self-pay | Admitting: Dermatology

## 2021-01-27 ENCOUNTER — Telehealth: Payer: Self-pay

## 2021-01-27 NOTE — Telephone Encounter (Signed)
Patient was advised of information per Dr. Laurence Ferrari and Lenna Sciara.

## 2021-01-27 NOTE — Telephone Encounter (Addendum)
Called [facility] and spoke with Tiffany, Dr. Nira Retort- [facility]'s  assistant. Asked if pathologist could section through block to rule out scc. Tiffany states that she will relay information to pathologist and will have report possibly by end of the day.   Will await further results before contacting patient at this time.      ----- Message from Alfonso Patten, MD sent at 01/20/2021  5:33 PM EST ----- Biopsy showed Wart. Exam could be consistent with wart but somewhat suspicious for SCC as well. Can you please call [facility] and ask them to section through the block to just be sure they do not see anything suspicious for SCC?  Thank you!

## 2021-01-27 NOTE — Telephone Encounter (Signed)
Tried calling patient to give update on results. She did not answer. Left message for patien tto call office.

## 2021-01-27 NOTE — Telephone Encounter (Signed)
Please give patient an update that this showed wart but we are having them double check more sections to be sure - just to be sure she is not left without info over the holiday. Thank you!  Cc'd Estill Bamberg and Loma Sousa too to be sure this is done before Thanksgiving since we are already at 2 weeks. Thank you!

## 2021-02-01 DIAGNOSIS — M25511 Pain in right shoulder: Secondary | ICD-10-CM | POA: Diagnosis not present

## 2021-02-01 DIAGNOSIS — M7541 Impingement syndrome of right shoulder: Secondary | ICD-10-CM | POA: Diagnosis not present

## 2021-02-08 ENCOUNTER — Encounter: Payer: Self-pay | Admitting: Family Medicine

## 2021-02-08 ENCOUNTER — Ambulatory Visit (INDEPENDENT_AMBULATORY_CARE_PROVIDER_SITE_OTHER): Payer: Medicare Other | Admitting: Family Medicine

## 2021-02-08 ENCOUNTER — Other Ambulatory Visit: Payer: Self-pay

## 2021-02-08 VITALS — BP 129/56 | HR 83 | Temp 98.1°F | Wt 111.6 lb

## 2021-02-08 DIAGNOSIS — Z720 Tobacco use: Secondary | ICD-10-CM | POA: Diagnosis not present

## 2021-02-08 DIAGNOSIS — J069 Acute upper respiratory infection, unspecified: Secondary | ICD-10-CM

## 2021-02-08 DIAGNOSIS — I129 Hypertensive chronic kidney disease with stage 1 through stage 4 chronic kidney disease, or unspecified chronic kidney disease: Secondary | ICD-10-CM

## 2021-02-08 NOTE — Assessment & Plan Note (Signed)
Under good control on current regimen. Continue current regimen. Continue to monitor. Call with any concerns. Refills given. Labs drawn today.   

## 2021-02-08 NOTE — Progress Notes (Signed)
BP (!) 129/56   Pulse 83   Temp 98.1 F (36.7 C)   Wt 111 lb 9.6 oz (50.6 kg)   LMP  (LMP Unknown)   BMI 22.95 kg/m    Subjective:    Patient ID: Kristi Clark, female    DOB: 28-Aug-1950, 70 y.o.   MRN: [id number]  HPI: Kristi Clark is a 70 y.o. female  Chief Complaint  Patient presents with   Hypertension   Nicotine Dependence    Patient states she has not been taking the wellbutrin    UPPER RESPIRATORY TRACT INFECTION Duration: 5-6 days Worst symptom: congestion, sneezing, cough Fever: no Cough: yes Shortness of breath: no Wheezing: yes Chest pain: no Chest tightness: no Chest congestion: no Nasal congestion: yes Runny nose: yes Post nasal drip: yes Sneezing: yes Sore throat: yes Swollen glands: no Sinus pressure: no Headache: no Face pain: no Toothache: no Ear pain: no  Ear pressure: no  Eyes red/itching:no Eye drainage/crusting: no  Vomiting: no Rash: no Fatigue: yes Sick contacts: yes Strep contacts: no  Context: better Recurrent sinusitis: no Relief with OTC cold/cough medications: no  Treatments attempted: none   HYPERTENSION Hypertension status: controlled  Satisfied with current treatment? yes Duration of hypertension: chronic BP monitoring frequency:  not checking BP medication side effects:  no Medication compliance: excellent compliance Aspirin: yes Recurrent headaches: yes Visual changes: no Palpitations: no Dyspnea: no Chest pain: no Lower extremity edema: no Dizzy/lightheaded: no   Relevant past medical, surgical, family and social history reviewed and updated as indicated. Interim medical history since our last visit reviewed. Allergies and medications reviewed and updated.  Review of Systems  Constitutional:  Positive for appetite change and fatigue. Negative for activity change, chills, diaphoresis, fever and unexpected weight change.  HENT:  Positive for congestion, postnasal drip and rhinorrhea. Negative  for dental problem, drooling, ear discharge, ear pain, facial swelling, hearing loss, mouth sores, nosebleeds, sinus pressure, sinus pain, sneezing, sore throat, tinnitus, trouble swallowing and voice change.   Respiratory: Negative.    Cardiovascular: Negative.   Gastrointestinal: Negative.   Musculoskeletal: Negative.   Neurological: Negative.   Psychiatric/Behavioral: Negative.     Per HPI unless specifically indicated above     Objective:    BP (!) 129/56   Pulse 83   Temp 98.1 F (36.7 C)   Wt 111 lb 9.6 oz (50.6 kg)   LMP  (LMP Unknown)   BMI 22.95 kg/m   Wt Readings from Last 3 Encounters:  02/08/21 111 lb 9.6 oz (50.6 kg)  01/06/21 112 lb 9.6 oz (51.1 kg)  12/09/20 114 lb 6.4 oz (51.9 kg)    Physical Exam Vitals and nursing note reviewed.  Constitutional:      General: She is not in acute distress.    Appearance: Normal appearance. She is not ill-appearing, toxic-appearing or diaphoretic.  HENT:     Head: Normocephalic and atraumatic.     Right Ear: External ear normal.     Left Ear: External ear normal.     Nose: Nose normal.     Mouth/Throat:     Mouth: Mucous membranes are moist.     Pharynx: Oropharynx is clear.  Eyes:     General: No scleral icterus.       Right eye: No discharge.        Left eye: No discharge.     Extraocular Movements: Extraocular movements intact.     Conjunctiva/sclera: Conjunctivae normal.     Pupils:  Pupils are equal, round, and reactive to light.  Cardiovascular:     Rate and Rhythm: Normal rate and regular rhythm.     Pulses: Normal pulses.     Heart sounds: Normal heart sounds. No murmur heard.   No friction rub. No gallop.  Pulmonary:     Effort: Pulmonary effort is normal. No respiratory distress.     Breath sounds: Normal breath sounds. No stridor. No wheezing, rhonchi or rales.  Chest:     Chest wall: No tenderness.  Musculoskeletal:        General: Normal range of motion.     Cervical back: Normal range of motion  and neck supple.  Skin:    General: Skin is warm and dry.     Capillary Refill: Capillary refill takes less than 2 seconds.     Coloration: Skin is not jaundiced or pale.     Findings: No bruising, erythema, lesion or rash.  Neurological:     General: No focal deficit present.     Mental Status: She is alert and oriented to person, place, and time. Mental status is at baseline.  Psychiatric:        Mood and Affect: Mood normal.        Behavior: Behavior normal.        Thought Content: Thought content normal.        Judgment: Judgment normal.    Results for orders placed or performed in visit on 01/06/10  Basic metabolic panel  Result Value Ref Range   Glucose 91 65 - 99 mg/dL   BUN 11 8 - 27 mg/dL   Creatinine, Ser 0.98 0.57 - 1.00 mg/dL   eGFR 62 >59 mL/min/1.73   BUN/Creatinine Ratio 11 (L) 12 - 28   Sodium 140 134 - 144 mmol/L   Potassium 4.0 3.5 - 5.2 mmol/L   Chloride 101 96 - 106 mmol/L   CO2 27 20 - 29 mmol/L   Calcium 10.0 8.7 - 10.3 mg/dL      Assessment & Plan:   Problem List Items Addressed This Visit       Cardiovascular and Mediastinum   Benign hypertension with chronic kidney disease - Primary    Under good control on current regimen. Continue current regimen. Continue to monitor. Call with any concerns. Refills given. Labs drawn today.       Relevant Orders   Basic metabolic panel     Other   Tobacco abuse    Did not start the wellbutrin. Encouraged her to start it when she's ready. Call with any concerns.       Other Visit Diagnoses     Upper respiratory tract infection, unspecified type       Encouraged symptomatic treatment. Getting better. Call if getting worse again or not going away. Call with any concerns.         Follow up plan: Return in about 5 months (around 07/09/2021).

## 2021-02-08 NOTE — Assessment & Plan Note (Signed)
Did not start the wellbutrin. Encouraged her to start it when she's ready. Call with any concerns.

## 2021-02-09 ENCOUNTER — Other Ambulatory Visit: Payer: Self-pay | Admitting: Family Medicine

## 2021-02-09 LAB — BASIC METABOLIC PANEL
BUN/Creatinine Ratio: 9 — ABNORMAL LOW (ref 12–28)
BUN: 8 mg/dL (ref 8–27)
CO2: 27 mmol/L (ref 20–29)
Calcium: 9.7 mg/dL (ref 8.7–10.3)
Chloride: 89 mmol/L — ABNORMAL LOW (ref 96–106)
Creatinine, Ser: 0.86 mg/dL (ref 0.57–1.00)
Glucose: 88 mg/dL (ref 70–99)
Potassium: 3.8 mmol/L (ref 3.5–5.2)
Sodium: 129 mmol/L — ABNORMAL LOW (ref 134–144)
eGFR: 72 mL/min/{1.73_m2} (ref >59)

## 2021-02-09 MED ORDER — INDAPAMIDE 2.5 MG PO TABS: 2.5000 mg | ORAL_TABLET | Freq: Every day | ORAL | 1 refills | Status: DC

## 2021-02-16 DIAGNOSIS — F17200 Nicotine dependence, unspecified, uncomplicated: Secondary | ICD-10-CM | POA: Diagnosis not present

## 2021-02-16 DIAGNOSIS — I10 Essential (primary) hypertension: Secondary | ICD-10-CM | POA: Diagnosis not present

## 2021-02-16 DIAGNOSIS — J449 Chronic obstructive pulmonary disease, unspecified: Secondary | ICD-10-CM | POA: Diagnosis not present

## 2021-02-16 DIAGNOSIS — I2510 Atherosclerotic heart disease of native coronary artery without angina pectoris: Secondary | ICD-10-CM | POA: Diagnosis not present

## 2021-02-16 DIAGNOSIS — E782 Mixed hyperlipidemia: Secondary | ICD-10-CM | POA: Diagnosis not present

## 2021-02-16 DIAGNOSIS — Z9861 Coronary angioplasty status: Secondary | ICD-10-CM | POA: Diagnosis not present

## 2021-02-18 ENCOUNTER — Encounter: Payer: Self-pay | Admitting: Nurse Practitioner

## 2021-02-18 ENCOUNTER — Ambulatory Visit (INDEPENDENT_AMBULATORY_CARE_PROVIDER_SITE_OTHER): Payer: Medicare Other | Admitting: Nurse Practitioner

## 2021-02-18 ENCOUNTER — Other Ambulatory Visit: Payer: Self-pay

## 2021-02-18 VITALS — BP 139/64 | HR 81 | Wt 112.0 lb

## 2021-02-18 DIAGNOSIS — I129 Hypertensive chronic kidney disease with stage 1 through stage 4 chronic kidney disease, or unspecified chronic kidney disease: Secondary | ICD-10-CM | POA: Diagnosis not present

## 2021-02-18 NOTE — Telephone Encounter (Signed)
Pt has an appt today at 10:40 with Lauren for BP check

## 2021-02-18 NOTE — Progress Notes (Signed)
Acute Office Visit  Subjective:    Patient ID: Kristi Clark, female    DOB: June 15, 1950, 70 y.o.   MRN: [id number]  Chief Complaint  Patient presents with   Hypertension    Patient is here for BP check. Patient states she went to her Cardiologist and her blood pressure was elevated and he increased her dosage by 50 more MG. Patient states she would like to discuss with provider at today's visit.     HPI Patient is in today for concerns over starting a new blood pressure medicine by her cardiologist. At cardiologist office a few days ago, her blood pressure was 174/60s and he started her on hydralazine 52m BID. Since then, she has been checking her blood pressure at home with the following readings: 153/54, 141/61, 148/60. She denies shortness of breath, chest pain, headaches, and dizziness.   Past Medical History:  Diagnosis Date   AK (actinic keratosis) 10/25/2017   left forehead   BCC (basal cell carcinoma) 10/25/2017   left upper lateral eyelid, Moh's   COPD (chronic obstructive pulmonary disease) (HCC)    Depression    GERD (gastroesophageal reflux disease)    Hyperlipidemia    Hypertension    Menopause    Osteopenia    Tobacco abuse     Past Surgical History:  Procedure Laterality Date   CARDIAC CATHETERIZATION     CHOLECYSTECTOMY  2014   COLONOSCOPY WITH PROPOFOL N/A 01/16/2018   Procedure: COLONOSCOPY WITH PROPOFOL;  Surgeon: SLollie Sails MD;  [facility]  Service: Endoscopy;  Laterality: N/A;   ESOPHAGOGASTRODUODENOSCOPY (EGD) WITH PROPOFOL N/A 01/16/2018   Procedure: ESOPHAGOGASTRODUODENOSCOPY (EGD) WITH PROPOFOL;  Surgeon: SLollie Sails MD;  [facility]  Service: Endoscopy;  Laterality: N/A;   heart stent     TOTAL ABDOMINAL HYSTERECTOMY      Family History  Problem Relation Age of Onset   Cancer Mother    Stroke Mother    Heart disease Father    Hyperlipidemia Father    Cancer Sister    Diabetes Sister     Breast cancer Sister 640  Diabetes Brother    Asthma Son    Cancer Son    Diabetes Daughter    Hypertension Daughter    Cancer Maternal Grandmother        gallbladder   Diabetes Brother    Heart disease Brother    Breast cancer Paternal Aunt     Social History   Socioeconomic History   Marital status: Widowed    Spouse name: Not on file   Number of children: Not on file   Years of education: Not on file   Highest education level: 10th grade  Occupational History   Occupation: retired  Tobacco Use   Smoking status: Every Day    Packs/day: 1.00    Years: 56.00    Pack years: 56.00    Types: Cigarettes   Smokeless tobacco: Never   Tobacco comments:    has the gum and patch.   Vaping Use   Vaping Use: Never used  Substance and Sexual Activity   Alcohol use: No   Drug use: No   Sexual activity: Never  Other Topics Concern   Not on file  Social History Narrative   Not on file   Social Determinants of Health   Financial Resource Strain: Low Risk    Difficulty of Paying Living Expenses: Not hard at all  Food Insecurity: No Food Insecurity  Worried About Charity fundraiser in the Last Year: Never true   [place] in the Last Year: Never true  Transportation Needs: No Transportation Needs   Lack of Transportation (Medical): No   Lack of Transportation (Non-Medical): No  Physical Activity: Inactive   Days of Exercise per Week: 0 days   Minutes of Exercise per Session: 0 min  Stress: No Stress Concern Present   Feeling of Stress : Not at all  Social Connections: Not on file  Intimate Partner Violence: Not on file    Outpatient Medications Prior to Visit  Medication Sig Dispense Refill   albuterol (VENTOLIN HFA) 108 (90 Base) MCG/ACT inhaler Inhale 2 puffs into the lungs every 6 (six) hours as needed for wheezing or shortness of breath. 18 g 3   aspirin 81 MG tablet Take 81 mg by mouth daily.     atorvastatin (LIPITOR) 40 MG tablet Take 1 tablet (40 mg  total) by mouth daily. 90 tablet 1   Blood Pressure Monitoring (BLOOD PRESSURE MONITOR/WRIST) KIT Take Blood pressure as needed, Dx: I12.9 1 kit 0   buPROPion (WELLBUTRIN SR) 150 MG 12 hr tablet Take 150 mg by mouth daily. Take 1 tablet daily for 4 days, then increase to 2 tablets twice a day.     Calcium Carbonate (CALCIUM 600 PO) Take by mouth daily.     Cetirizine HCl (ZYRTEC PO) Take by mouth daily.     clopidogrel (PLAVIX) 75 MG tablet Take 1 tablet (75 mg total) by mouth daily. 90 tablet 1   diclofenac Sodium (VOLTAREN) 1 % GEL Apply 4 g topically 4 (four) times daily. 100 g 3   fluticasone (FLONASE) 50 MCG/ACT nasal spray Place 2 sprays into both nostrils daily. 3 g 3   fluticasone-salmeterol (ADVAIR DISKUS) 250-50 MCG/ACT AEPB Inhale 1 puff into the lungs in the morning and at bedtime. 60 each 3   hydrALAZINE (APRESOLINE) 25 MG tablet Take 25 mg by mouth 2 (two) times daily.     indapamide (LOZOL) 2.5 MG tablet Take 1 tablet (2.5 mg total) by mouth daily. 90 tablet 1   isosorbide mononitrate (IMDUR) 60 MG 24 hr tablet Take 1 tablet (60 mg total) by mouth daily. 90 tablet 1   losartan (COZAAR) 100 MG tablet Take 1 tablet (100 mg total) by mouth daily. 90 tablet 1   metoprolol succinate (TOPROL-XL) 50 MG 24 hr tablet Take 1 tablet (50 mg total) by mouth daily. Take with or immediately following a meal. 90 tablet 1   Multiple Vitamins-Minerals (CENTRUM SILVER ADULT 50+ PO) Take 1 tablet by mouth daily.     pantoprazole (PROTONIX) 40 MG tablet TAKE ONE TABLET BY MOUTH EVERY DAY 90 tablet 2   polyethylene glycol powder (GLYCOLAX/MIRALAX) 17 GM/SCOOP powder Take 17 g by mouth daily as needed for moderate constipation. 3350 g 1   Probiotic Product (PROBIOTIC-10 PO) Take by mouth. Every other day     tiotropium (SPIRIVA) 18 MCG inhalation capsule Place 1 capsule (18 mcg total) into inhaler and inhale daily. 90 capsule 3   nystatin (MYCOSTATIN) 100000 UNIT/ML suspension Take 5 mLs (500,000 Units  total) by mouth 4 (four) times daily. (Patient not taking: Reported on 02/08/2021) 60 mL 0   No facility-administered medications prior to visit.    Allergies  Allergen Reactions   Clindamycin/Lincomycin Hives   Prednisone Other (See Comments)    Psychosis   Amoxicillin Rash   Avelox [Moxifloxacin Hcl In Nacl] Rash  Codeine Sulfate Nausea Only   Penicillins Rash   Sulfa Antibiotics Rash    Review of Systems  Constitutional: Negative.   Respiratory: Negative.    Cardiovascular: Negative.   Gastrointestinal: Negative.   Neurological: Negative.       Objective:    Physical Exam Vitals and nursing note reviewed.  Constitutional:      General: She is not in acute distress.    Appearance: Normal appearance.  HENT:     Head: Normocephalic.  Eyes:     Conjunctiva/sclera: Conjunctivae normal.  Cardiovascular:     Rate and Rhythm: Normal rate and regular rhythm.     Pulses: Normal pulses.     Heart sounds: Normal heart sounds.  Pulmonary:     Effort: Pulmonary effort is normal.     Breath sounds: Normal breath sounds.  Musculoskeletal:     Cervical back: Normal range of motion.  Skin:    General: Skin is warm.  Neurological:     General: No focal deficit present.     Mental Status: She is alert and oriented to person, place, and time.  Psychiatric:        Mood and Affect: Mood normal.        Behavior: Behavior normal.        Thought Content: Thought content normal.        Judgment: Judgment normal.    BP 139/64    Pulse 81    Wt 112 lb (50.8 kg)    LMP  (LMP Unknown)    SpO2 95%    BMI 23.04 kg/m  Wt Readings from Last 3 Encounters:  02/18/21 112 lb (50.8 kg)  02/08/21 111 lb 9.6 oz (50.6 kg)  01/06/21 112 lb 9.6 oz (51.1 kg)    Health Maintenance Due  Topic Date Due   Zoster Vaccines- Shingrix (1 of 2) Never done   COVID-19 Vaccine (6 - Booster) 04/02/2020    There are no preventive care reminders to display for this patient.   Lab Results  Component  Value Date   TSH 0.829 12/23/2019   Lab Results  Component Value Date   WBC 5.9 08/31/2020   HGB 12.4 08/31/2020   HCT 37.3 08/31/2020   MCV 93 08/31/2020   PLT 229 08/31/2020   Lab Results  Component Value Date   NA 129 (L) 02/08/2021   K 3.8 02/08/2021   CO2 27 02/08/2021   GLUCOSE 88 02/08/2021   BUN 8 02/08/2021   CREATININE 0.86 02/08/2021   BILITOT 0.5 08/31/2020   ALKPHOS 100 08/31/2020   AST 25 08/31/2020   ALT 18 08/31/2020   PROT 6.9 08/31/2020   ALBUMIN 4.4 08/31/2020   CALCIUM 9.7 02/08/2021   ANIONGAP 9 03/02/2020   EGFR 72 02/08/2021   Lab Results  Component Value Date   CHOL 135 08/31/2020   Lab Results  Component Value Date   HDL 65 08/31/2020   Lab Results  Component Value Date   LDLCALC 55 08/31/2020   Lab Results  Component Value Date   TRIG 77 08/31/2020   No results found for: CHOLHDL No results found for: HGBA1C     Assessment & Plan:   Problem List Items Addressed This Visit       Cardiovascular and Mediastinum   Benign hypertension with chronic kidney disease - Primary    Blood pressure today 139/64. Educated her that it is ok to continue the hydralazine 70m BID and indapamide that was started last visit. Encouraged  her to keep monitoring her blood pressure daily at home, or if she has any concerns about her blood pressure being low. Call the office with any concerns. Keep next appointment with cardiology on 03/02/21.       Relevant Medications   hydrALAZINE (APRESOLINE) 25 MG tablet     No orders of the defined types were placed in this encounter.    Charyl Dancer, NP

## 2021-02-18 NOTE — Patient Instructions (Addendum)
Call or send a mychart message if your blood pressure starts going below 120/60  Check your blood pressure daily

## 2021-02-18 NOTE — Assessment & Plan Note (Signed)
Blood pressure today 139/64. Educated her that it is ok to continue the hydralazine 25mg  BID and indapamide that was started last visit. Encouraged her to keep monitoring her blood pressure daily at home, or if she has any concerns about her blood pressure being low. Call the office with any concerns. Keep next appointment with cardiology on 03/02/21.

## 2021-02-18 NOTE — Telephone Encounter (Signed)
Appt so we can see where her BP is

## 2021-02-21 DIAGNOSIS — J111 Influenza due to unidentified influenza virus with other respiratory manifestations: Secondary | ICD-10-CM | POA: Diagnosis not present

## 2021-02-21 DIAGNOSIS — Z20822 Contact with and (suspected) exposure to covid-19: Secondary | ICD-10-CM | POA: Diagnosis not present

## 2021-02-21 DIAGNOSIS — M791 Myalgia, unspecified site: Secondary | ICD-10-CM | POA: Diagnosis not present

## 2021-02-22 ENCOUNTER — Encounter: Payer: Self-pay | Admitting: Family Medicine

## 2021-02-23 ENCOUNTER — Other Ambulatory Visit: Payer: Self-pay

## 2021-02-23 ENCOUNTER — Ambulatory Visit (INDEPENDENT_AMBULATORY_CARE_PROVIDER_SITE_OTHER): Payer: Medicare Other | Admitting: Nurse Practitioner

## 2021-02-23 ENCOUNTER — Encounter: Payer: Self-pay | Admitting: Nurse Practitioner

## 2021-02-23 VITALS — BP 102/50 | HR 67 | Temp 97.8°F | Wt 110.6 lb

## 2021-02-23 DIAGNOSIS — R2681 Unsteadiness on feet: Secondary | ICD-10-CM | POA: Diagnosis not present

## 2021-02-23 DIAGNOSIS — R051 Acute cough: Secondary | ICD-10-CM

## 2021-02-23 DIAGNOSIS — I129 Hypertensive chronic kidney disease with stage 1 through stage 4 chronic kidney disease, or unspecified chronic kidney disease: Secondary | ICD-10-CM

## 2021-02-23 LAB — VERITOR FLU A/B WAIVED
Influenza A: NEGATIVE
Influenza B: NEGATIVE

## 2021-02-23 MED ORDER — AZITHROMYCIN 250 MG PO TABS: ORAL_TABLET | ORAL | 0 refills | Status: AC

## 2021-02-23 NOTE — Patient Instructions (Addendum)
Stop indapamide, keep checking blood pressure  Start zpak, 2 tablets today, then 1 tablet daily starting tomorrow Rest, drink fluids

## 2021-02-23 NOTE — Progress Notes (Signed)
Acute Office Visit  Subjective:    Patient ID: Kristi Clark, female    DOB: 02-28-1951, 70 y.o.   MRN: [id number]  Chief Complaint  Patient presents with   URI    Pt states she has been having a cough, congestion, body aches, chills, and sinus pressure since Saturday. States she went to [facility] on Sunday, tested negative for flu and covid but still treated for the flu. Currently on Tamiflu.     HPI Patient is in today for cough, congestion, and body aches since Saturday morning. She went to urgent care on Sunday and tested negative for the flu and covid-19 and was negative. She was given tamiflu and does not feel any better.   UPPER RESPIRATORY TRACT INFECTION  Worst symptom: many Fever: yes 100 at urgent care Cough: yes Shortness of breath: yes - intermittent with walking Wheezing: no Chest pain: yes, with cough Chest tightness: yes Chest congestion: no Nasal congestion: yes Runny nose: yes Post nasal drip: yes Sneezing: yes Sore throat: yes Swollen glands: no Sinus pressure: yes Headache: yes Face pain: no Toothache: no Ear pain: no  Ear pressure: no  Eyes red/itching:no Eye drainage/crusting: no  Vomiting: no Rash: no Fatigue: yes Sick contacts: yes - granddaughter Strep contacts: no  Context: stable Recurrent sinusitis: no Relief with OTC cold/cough medications: yes  Treatments attempted: mucinex, delsym, tamiflu    Past Medical History:  Diagnosis Date   AK (actinic keratosis) 10/25/2017   left forehead   BCC (basal cell carcinoma) 10/25/2017   left upper lateral eyelid, Moh's   COPD (chronic obstructive pulmonary disease) (HCC)    Depression    GERD (gastroesophageal reflux disease)    Hyperlipidemia    Hypertension    Menopause    Osteopenia    Tobacco abuse     Past Surgical History:  Procedure Laterality Date   CARDIAC CATHETERIZATION     CHOLECYSTECTOMY  2014   COLONOSCOPY WITH PROPOFOL N/A 01/16/2018   Procedure: COLONOSCOPY WITH  PROPOFOL;  Surgeon: Lollie Sails, MD;  Location: [facility];  Service: Endoscopy;  Laterality: N/A;   ESOPHAGOGASTRODUODENOSCOPY (EGD) WITH PROPOFOL N/A 01/16/2018   Procedure: ESOPHAGOGASTRODUODENOSCOPY (EGD) WITH PROPOFOL;  Surgeon: Lollie Sails, MD;  Location: [facility] ENDOSCOPY;  Service: Endoscopy;  Laterality: N/A;   heart stent     TOTAL ABDOMINAL HYSTERECTOMY      Family History  Problem Relation Age of Onset   Cancer Mother    Stroke Mother    Heart disease Father    Hyperlipidemia Father    Cancer Sister    Diabetes Sister    Breast cancer Sister 43   Diabetes Brother    Asthma Son    Cancer Son    Diabetes Daughter    Hypertension Daughter    Cancer Maternal Grandmother        gallbladder   Diabetes Brother    Heart disease Brother    Breast cancer Paternal Aunt     Social History   Socioeconomic History   Marital status: Widowed    Spouse name: Not on file   Number of children: Not on file   Years of education: Not on file   Highest education level: 10th grade  Occupational History   Occupation: retired  Tobacco Use   Smoking status: Every Day    Packs/day: 1.00    Years: 56.00    Pack years: 56.00    Types: Cigarettes   Smokeless tobacco: Never   Tobacco  comments:    has the gum and patch.   Vaping Use   Vaping Use: Never used  Substance and Sexual Activity   Alcohol use: No   Drug use: No   Sexual activity: Never  Other Topics Concern   Not on file  Social History Narrative   Not on file   Social Determinants of Health   Financial Resource Strain: Low Risk    Difficulty of Paying Living Expenses: Not hard at all  Food Insecurity: No Food Insecurity   Worried About Charity fundraiser in the Last Year: Never true   [place] in the Last Year: Never true  Transportation Needs: No Transportation Needs   Lack of Transportation (Medical): No   Lack of Transportation (Non-Medical): No  Physical Activity: Inactive   Days of  Exercise per Week: 0 days   Minutes of Exercise per Session: 0 min  Stress: No Stress Concern Present   Feeling of Stress : Not at all  Social Connections: Not on file  Intimate Partner Violence: Not on file    Outpatient Medications Prior to Visit  Medication Sig Dispense Refill   albuterol (VENTOLIN HFA) 108 (90 Base) MCG/ACT inhaler Inhale 2 puffs into the lungs every 6 (six) hours as needed for wheezing or shortness of breath. 18 g 3   aspirin 81 MG tablet Take 81 mg by mouth daily.     atorvastatin (LIPITOR) 40 MG tablet Take 1 tablet (40 mg total) by mouth daily. 90 tablet 1   Blood Pressure Monitoring (BLOOD PRESSURE MONITOR/WRIST) KIT Take Blood pressure as needed, Dx: I12.9 1 kit 0   buPROPion (WELLBUTRIN SR) 150 MG 12 hr tablet Take 150 mg by mouth daily. Take 1 tablet daily for 4 days, then increase to 2 tablets twice a day.     Calcium Carbonate (CALCIUM 600 PO) Take by mouth daily.     Cetirizine HCl (ZYRTEC PO) Take by mouth daily.     clopidogrel (PLAVIX) 75 MG tablet Take 1 tablet (75 mg total) by mouth daily. 90 tablet 1   diclofenac Sodium (VOLTAREN) 1 % GEL Apply 4 g topically 4 (four) times daily. 100 g 3   fluticasone (FLONASE) 50 MCG/ACT nasal spray Place 2 sprays into both nostrils daily. 3 g 3   fluticasone-salmeterol (ADVAIR DISKUS) 250-50 MCG/ACT AEPB Inhale 1 puff into the lungs in the morning and at bedtime. 60 each 3   hydrALAZINE (APRESOLINE) 25 MG tablet Take 25 mg by mouth 2 (two) times daily.     isosorbide mononitrate (IMDUR) 60 MG 24 hr tablet Take 1 tablet (60 mg total) by mouth daily. 90 tablet 1   losartan (COZAAR) 100 MG tablet Take 1 tablet (100 mg total) by mouth daily. 90 tablet 1   metoprolol succinate (TOPROL-XL) 50 MG 24 hr tablet Take 1 tablet (50 mg total) by mouth daily. Take with or immediately following a meal. 90 tablet 1   Multiple Vitamins-Minerals (CENTRUM SILVER ADULT 50+ PO) Take 1 tablet by mouth daily.     oseltamivir (TAMIFLU) 75 MG  capsule Take 75 mg by mouth 2 (two) times daily.     pantoprazole (PROTONIX) 40 MG tablet TAKE ONE TABLET BY MOUTH EVERY DAY 90 tablet 2   polyethylene glycol powder (GLYCOLAX/MIRALAX) 17 GM/SCOOP powder Take 17 g by mouth daily as needed for moderate constipation. 3350 g 1   Probiotic Product (PROBIOTIC-10 PO) Take by mouth. Every other day     tiotropium [facility])  18 MCG inhalation capsule Place 1 capsule (18 mcg total) into inhaler and inhale daily. 90 capsule 3   indapamide (LOZOL) 2.5 MG tablet Take 1 tablet (2.5 mg total) by mouth daily. 90 tablet 1   nystatin (MYCOSTATIN) 100000 UNIT/ML suspension Take 5 mLs (500,000 Units total) by mouth 4 (four) times daily. (Patient not taking: Reported on 02/08/2021) 60 mL 0   No facility-administered medications prior to visit.    Allergies  Allergen Reactions   Clindamycin/Lincomycin Hives   Prednisone Other (See Comments)    Psychosis   Amoxicillin Rash   Avelox [Moxifloxacin Hcl In Nacl] Rash   Codeine Sulfate Nausea Only   Penicillins Rash   Sulfa Antibiotics Rash    Review of Systems  Constitutional:  Positive for fatigue and fever.  HENT:  Positive for congestion, postnasal drip, rhinorrhea, sinus pressure, sneezing and sore throat. Negative for ear pain.   Eyes: Negative.   Respiratory:  Positive for cough and shortness of breath (intermittent).   Cardiovascular: Negative.   Gastrointestinal: Negative.   Endocrine: Negative.   Genitourinary: Negative.   Musculoskeletal:  Positive for myalgias.  Skin: Negative.   Neurological:  Positive for weakness and headaches.  Psychiatric/Behavioral: Negative.        Objective:    Physical Exam Vitals and nursing note reviewed.  Constitutional:      General: She is not in acute distress.    Appearance: Normal appearance.  HENT:     Head: Normocephalic.     Right Ear: Tympanic membrane, ear canal and external ear normal.     Left Ear: Tympanic membrane, ear canal and external ear  normal.  Eyes:     Conjunctiva/sclera: Conjunctivae normal.  Cardiovascular:     Rate and Rhythm: Normal rate and regular rhythm.     Pulses: Normal pulses.     Heart sounds: Normal heart sounds.  Pulmonary:     Effort: Pulmonary effort is normal.     Breath sounds: Normal breath sounds.  Musculoskeletal:     Cervical back: Normal range of motion. No tenderness.  Lymphadenopathy:     Cervical: No cervical adenopathy.  Skin:    General: Skin is warm.  Neurological:     General: No focal deficit present.     Mental Status: She is alert and oriented to person, place, and time.  Psychiatric:        Mood and Affect: Mood normal.        Behavior: Behavior normal.        Thought Content: Thought content normal.        Judgment: Judgment normal.    BP (!) 102/50 (BP Location: Left Arm, Cuff Size: Normal)    Pulse 67    Temp 97.8 F (36.6 C) (Oral)    Wt 110 lb 9.6 oz (50.2 kg)    LMP  (LMP Unknown)    SpO2 93%    BMI 22.75 kg/m  Wt Readings from Last 3 Encounters:  02/23/21 110 lb 9.6 oz (50.2 kg)  02/18/21 112 lb (50.8 kg)  02/08/21 111 lb 9.6 oz (50.6 kg)    Health Maintenance Due  Topic Date Due   Zoster Vaccines- Shingrix (1 of 2) Never done   COVID-19 Vaccine (6 - Booster) 04/02/2020    There are no preventive care reminders to display for this patient.   Lab Results  Component Value Date   TSH 0.829 12/23/2019   Lab Results  Component Value Date   WBC 5.9 08/31/2020  HGB 12.4 08/31/2020   HCT 37.3 08/31/2020   MCV 93 08/31/2020   PLT 229 08/31/2020   Lab Results  Component Value Date   NA 129 (L) 02/08/2021   K 3.8 02/08/2021   CO2 27 02/08/2021   GLUCOSE 88 02/08/2021   BUN 8 02/08/2021   CREATININE 0.86 02/08/2021   BILITOT 0.5 08/31/2020   ALKPHOS 100 08/31/2020   AST 25 08/31/2020   ALT 18 08/31/2020   PROT 6.9 08/31/2020   ALBUMIN 4.4 08/31/2020   CALCIUM 9.7 02/08/2021   ANIONGAP 9 03/02/2020   EGFR 72 02/08/2021   Lab Results  Component  Value Date   CHOL 135 08/31/2020   Lab Results  Component Value Date   HDL 65 08/31/2020   Lab Results  Component Value Date   LDLCALC 55 08/31/2020   Lab Results  Component Value Date   TRIG 77 08/31/2020   No results found for: CHOLHDL No results found for: HGBA1C     Assessment & Plan:   Problem List Items Addressed This Visit       Cardiovascular and Mediastinum   Benign hypertension with chronic kidney disease    Blood pressure sitting 120/60 and standing 102/50. Will stop indapamide. Blood pressure at home has been running 110s/60s. Continue monitoring blood pressure at home. May be causing her unsteadiness. Follow up with cardiology next week.       Relevant Orders   Comprehensive metabolic panel   CBC with Differential/Platelet   Other Visit Diagnoses     Acute cough    -  Primary   Flu negative, covid-19 pending. She can stop tamiflu with 2 negative flu tests. Encourage rest, fluids. Will start zpak.    Relevant Orders   Veritor Flu A/B Waived (Completed)   Novel Coronavirus, NAA ([facility])   Unsteady       May be related to lower blood pressure vs URI. Check CMP, CBC. Stopping indapamide. Follow up if ongoing symptoms.    Relevant Orders   Comprehensive metabolic panel   CBC with Differential/Platelet        Meds ordered this encounter  Medications   azithromycin (ZITHROMAX) 250 MG tablet    Sig: Take 2 tablets on day 1, then 1 tablet daily on days 2 through 5    Dispense:  6 tablet    Refill:  0     Charyl Dancer, NP

## 2021-02-23 NOTE — Telephone Encounter (Signed)
Pt seen in office today.

## 2021-02-23 NOTE — Assessment & Plan Note (Addendum)
Blood pressure sitting 120/60 and standing 102/50. Will stop indapamide. Blood pressure at home has been running 110s/60s. Continue monitoring blood pressure at home. May be causing her unsteadiness. Follow up with cardiology next week.

## 2021-02-23 NOTE — Telephone Encounter (Signed)
Lmom asking pt to call back to schedule an appt. °

## 2021-02-24 ENCOUNTER — Other Ambulatory Visit: Payer: Self-pay | Admitting: Nurse Practitioner

## 2021-02-24 ENCOUNTER — Ambulatory Visit
Admission: RE | Admit: 2021-02-24 | Discharge: 2021-02-24 | Disposition: A | Discharge status: Home / Self Care | Payer: Medicare Other | Source: Ambulatory Visit | Attending: Nurse Practitioner | Admitting: Nurse Practitioner

## 2021-02-24 ENCOUNTER — Ambulatory Visit (INDEPENDENT_AMBULATORY_CARE_PROVIDER_SITE_OTHER): Payer: Medicare Other | Admitting: Nurse Practitioner

## 2021-02-24 ENCOUNTER — Ambulatory Visit: Payer: Self-pay | Admitting: *Deleted

## 2021-02-24 ENCOUNTER — Encounter: Payer: Self-pay | Admitting: Nurse Practitioner

## 2021-02-24 VITALS — BP 149/64 | HR 77 | Temp 98.0°F | Wt 110.0 lb

## 2021-02-24 DIAGNOSIS — F17218 Nicotine dependence, cigarettes, with other nicotine-induced disorders: Secondary | ICD-10-CM | POA: Diagnosis not present

## 2021-02-24 DIAGNOSIS — J441 Chronic obstructive pulmonary disease with (acute) exacerbation: Secondary | ICD-10-CM | POA: Diagnosis not present

## 2021-02-24 DIAGNOSIS — M542 Cervicalgia: Secondary | ICD-10-CM | POA: Diagnosis not present

## 2021-02-24 DIAGNOSIS — E871 Hypo-osmolality and hyponatremia: Secondary | ICD-10-CM

## 2021-02-24 LAB — CBC WITH DIFFERENTIAL/PLATELET
Basophils Absolute: 0 10*3/uL (ref 0.0–0.2)
Basos: 0 %
EOS (ABSOLUTE): 0 10*3/uL (ref 0.0–0.4)
Eos: 0 %
Hematocrit: 35.2 % (ref 34.0–46.6)
Hemoglobin: 12.6 g/dL (ref 11.1–15.9)
Immature Grans (Abs): 0 10*3/uL (ref 0.0–0.1)
Immature Granulocytes: 0 %
Lymphocytes Absolute: 2.3 10*3/uL (ref 0.7–3.1)
Lymphs: 25 %
MCH: 32.1 pg (ref 26.6–33.0)
MCHC: 35.8 g/dL — ABNORMAL HIGH (ref 31.5–35.7)
MCV: 90 fL (ref 79–97)
Monocytes Absolute: 0.8 10*3/uL (ref 0.1–0.9)
Monocytes: 8 %
Neutrophils Absolute: 5.9 10*3/uL (ref 1.4–7.0)
Neutrophils: 67 %
Platelets: 313 10*3/uL (ref 150–450)
RBC: 3.93 x10E6/uL (ref 3.77–5.28)
RDW: 12.2 % (ref 11.7–15.4)
WBC: 9 10*3/uL (ref 3.4–10.8)

## 2021-02-24 LAB — COMPREHENSIVE METABOLIC PANEL
ALT: 23 IU/L (ref 0–32)
AST: 32 IU/L (ref 0–40)
Albumin/Globulin Ratio: 1.5 (ref 1.2–2.2)
Albumin: 4.2 g/dL (ref 3.7–4.7)
Alkaline Phosphatase: 91 IU/L (ref 44–121)
BUN/Creatinine Ratio: 13 (ref 12–28)
BUN: 10 mg/dL (ref 8–27)
Bilirubin Total: 0.5 mg/dL (ref 0.0–1.2)
CO2: 26 mmol/L (ref 20–29)
Calcium: 10.1 mg/dL (ref 8.7–10.3)
Chloride: 82 mmol/L — ABNORMAL LOW (ref 96–106)
Creatinine, Ser: 0.8 mg/dL (ref 0.57–1.00)
Globulin, Total: 2.8 g/dL (ref 1.5–4.5)
Glucose: 103 mg/dL — ABNORMAL HIGH (ref 70–99)
Potassium: 3.6 mmol/L (ref 3.5–5.2)
Sodium: 123 mmol/L — ABNORMAL LOW (ref 134–144)
Total Protein: 7 g/dL (ref 6.0–8.5)
eGFR: 78 mL/min/{1.73_m2} (ref >59)

## 2021-02-24 LAB — NOVEL CORONAVIRUS, NAA: SARS-CoV-2, NAA: NOT DETECTED

## 2021-02-24 LAB — SARS-COV-2, NAA 2 DAY TAT

## 2021-02-24 MED ORDER — TIZANIDINE HCL 2 MG PO CAPS: 2.0000 mg | ORAL_CAPSULE | Freq: Three times a day (TID) | ORAL | 0 refills | Status: DC | PRN

## 2021-02-24 NOTE — Patient Instructions (Signed)

## 2021-02-24 NOTE — Telephone Encounter (Signed)
Second attempt to call patient- left message to call office. 

## 2021-02-24 NOTE — Assessment & Plan Note (Signed)
Acute, started Z-pack yesterday.  Flu negative and Covid pending.  Labs noting lower sodium, recommend increase sodium via diet at this time and remain off Wellbutrin.  Will plan on recheck of this Friday.  Obtain CXR to further assess. Suspect neck pain present due to coughing increased, will send in low dose Tizanidine for this, educated her on medication.  Recommend: - Increased rest - Increasing Fluids - Acetaminophen as needed for fever/pain.  - Salt water gargling, chloraseptic spray and throat lozenges - Mucinex or Coricidin - Stop Flonase and use saline nasal spray only due to nose bleed.   Return to office in 2 days for follow-up and repeat labs.

## 2021-02-24 NOTE — Assessment & Plan Note (Signed)
I have recommended complete cessation of tobacco use. I have discussed various options available for assistance with tobacco cessation including over the counter methods (Nicotine gum, patch and lozenges). We also discussed prescription options (Chantix, Nicotine Inhaler / Nasal Spray). The patient is not interested in pursuing any prescription tobacco cessation options at this time.  

## 2021-02-24 NOTE — Telephone Encounter (Signed)
Noted  

## 2021-02-24 NOTE — Progress Notes (Signed)
Lab order placed for rechecking sodium in 1 week.

## 2021-02-24 NOTE — Telephone Encounter (Signed)
Pt calling in experiencing, dizziness, neck pain, sinus pain and pressure, cough, and barely able to walk. She states that her head feels weird. She had an appt yesterday where the provider gave her a z pack she says she's feeling worse and is scared. Seeking clinical advise [phone]     Attempted to reach pt, left VM to call back to discuss symptoms.

## 2021-02-24 NOTE — Telephone Encounter (Signed)
° °  Chief Complaint: dizziness  and head spinning laying down, difficulty walking without walker, neck stiffness, sinus pressure, requesting to speak to PCP after starting z pack Symptoms: dizziness , head spinning, difficulty walking without walker, see above , BP 155/70 HR 71.  Frequency: dizziness since this past weekend Pertinent Negatives: Patient denies difficulty breathing or passing out, no weakness on either side of body, speech clear.  Disposition: [] ED /[] Urgent Care (no appt availability in office) / [x] Appointment(In office/virtual)/ []  [facility]/ [] Home Care/ [] Refused Recommended Disposition  Additional Notes:  Appt scheduled in office today . Please advise patient if recommending ED instead.  FC notified.     Reason for Disposition  [1] MODERATE dizziness (e.g., vertigo; feels very unsteady, interferes with normal activities) AND [2] has NOT been evaluated by physician for this  Answer Assessment - Initial Assessment Questions 1. DESCRIPTION: "Describe your dizziness."     Staggering with walking  2. VERTIGO: "Do you feel like either you or the room is spinning or tilting?"      Spinning when laying down 3. LIGHTHEADED: "Do you feel lightheaded?" (e.g., somewhat faint, woozy, weak upon standing)     Feels weird  feels like she will falls  4. SEVERITY: "How bad is it?"  "Can you walk?"   - MILD: Feels slightly dizzy and unsteady, but is walking normally.   - MODERATE: Feels unsteady when walking, but not falling; interferes with normal activities (e.g., school, work).   - SEVERE: Unable to walk without falling, or requires assistance to walk without falling.     Moderate  5. ONSET:  "When did the dizziness begin?"     Since the weekend  6. AGGRAVATING FACTORS: "Does anything make it worse?" (e.g., standing, change in head position)     Standing  7. CAUSE: "What do you think is causing the dizziness?"     Not sure  8. RECURRENT SYMPTOM: "Have you had  dizziness before?" If Yes, ask: "When was the last time?" "What happened that time?"     No  9. OTHER SYMPTOMS: "Do you have any other symptoms?" (e.g., headache, weakness, numbness, vomiting, earache)     Head feels heavy , neck stiffness 10. PREGNANCY: "Is there any chance you are pregnant?" "When was your last menstrual period?"       na  Protocols used: Dizziness - Vertigo-A-AH

## 2021-02-24 NOTE — Assessment & Plan Note (Addendum)
Noted on recent labs, increase sodium via diet at this time.  Return on Friday for visit and recheck. Will try to avoid ER setting and fluids unless needed or worsening symptoms -- patient prefers to avoid ER.

## 2021-02-24 NOTE — Progress Notes (Signed)
BP (!) 149/64 (BP Location: Left Arm, Cuff Size: Normal)    Pulse 77    Temp 98 F (36.7 C)    Wt 110 lb (49.9 kg)    LMP  (LMP Unknown)    SpO2 98%    BMI 22.63 kg/m    Subjective:    Patient ID: Kristi Clark, female    DOB: 1951-01-25, 70 y.o.   MRN: [id number]  HPI: Kristi Clark is a 70 y.o. female  Chief Complaint  Patient presents with   Dizziness    Patient states she saw Lauren yesterday and after appointment around 6 pm she started having dizzy spells.   Neck Pain   Difficulty Walking   Epistaxis   Grand daughter at bedside to assist with HPI.  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  Reports she continues to have ongoing dizziness today and neck pain.  Having difficulty with walking due to this.  Had a nose bleed this morning, uses Flonase at home.  Does have underlying COPD -- is using inhalers.  Does smoke, has cut back since being sick.  Does endorse dizziness presenting -- with head movement since being sick, started yesterday.  Negative flu yesterday and Covid is pending.  She started getting sick Friday night 02/19/21, great grand daughter was sick first.  Labs yesterday noted sodium 123 -- started Wellbutrin before this illness and has stopped taking because she could not sleep.   Fever: no Cough: yes Shortness of breath: yes Wheezing: no Chest pain: occasional with cough Chest tightness: yes Chest congestion: yes Nasal congestion: yes Runny nose: yes Post nasal drip: yes Sneezing: no Sore throat: yes Swollen glands: no Sinus pressure: yes frontal aspect Headache: no Face pain: no Toothache: no Ear pain: none Ear pressure: none Eyes red/itching:no Eye drainage/crusting: no  Vomiting:  some nausea since being sick Rash: no Fatigue: yes Sick contacts: yes Strep contacts: no  Context: stable Recurrent sinusitis: no Relief with OTC cold/cough  medications:  laying down   Treatments attempted: mucinex and antibiotics    Relevant past medical, surgical, family and social history reviewed and updated as indicated. Interim medical history since our last visit reviewed. Allergies and medications reviewed and updated.  Review of Systems  Constitutional:  Positive for fatigue. Negative for activity change, appetite change, chills and fever.  HENT:  Positive for congestion, postnasal drip, rhinorrhea and sinus pressure. Negative for ear discharge, ear pain, facial swelling, sinus pain, sneezing, sore throat and voice change.   Eyes:  Negative for pain and visual disturbance.  Respiratory:  Positive for cough, chest tightness and wheezing. Negative for shortness of breath.   Cardiovascular:  Negative for chest pain, palpitations and leg swelling.  Gastrointestinal:  Positive for nausea. Negative for abdominal distention, abdominal pain, constipation, diarrhea and vomiting.  Endocrine: Negative.   Musculoskeletal:  Positive for neck pain. Negative for myalgias.  Neurological: Negative.   Psychiatric/Behavioral: Negative.     Per HPI unless specifically indicated above     Objective:    BP (!) 149/64 (BP Location: Left Arm, Cuff Size: Normal)    Pulse 77    Temp 98 F (36.7 C)    Wt 110 lb (49.9 kg)    LMP  (LMP Unknown)    SpO2 98%    BMI 22.63 kg/m   Wt Readings from Last 3 Encounters:  02/24/21 110 lb (49.9 kg)  02/23/21  110 lb 9.6 oz (50.2 kg)  02/18/21 112 lb (50.8 kg)    Physical Exam Vitals and nursing note reviewed.  Constitutional:      General: She is awake. She is not in acute distress.    Appearance: She is well-developed and well-groomed. She is not ill-appearing or toxic-appearing.  HENT:     Head: Normocephalic.     Right Ear: Hearing, ear canal and external ear normal. A middle ear effusion is present. There is no impacted cerumen.     Left Ear: Hearing, ear canal and external ear normal. A middle ear effusion  is present. There is no impacted cerumen.     Ears:     Comments: Scant cerumen in right ear noted.    Nose: Rhinorrhea present. Rhinorrhea is clear.     Right Sinus: Frontal sinus tenderness present. No maxillary sinus tenderness.     Left Sinus: Frontal sinus tenderness present. No maxillary sinus tenderness.     Mouth/Throat:     Mouth: Mucous membranes are moist.     Pharynx: Oropharynx is clear. Posterior oropharyngeal erythema (with cobblestoning) present. No pharyngeal swelling or oropharyngeal exudate.  Eyes:     General: Lids are normal.        Right eye: No discharge.        Left eye: No discharge.     Conjunctiva/sclera: Conjunctivae normal.     Pupils: Pupils are equal, round, and reactive to light.  Neck:     Vascular: No carotid bruit.  Cardiovascular:     Rate and Rhythm: Normal rate and regular rhythm.     Heart sounds: Normal heart sounds. No murmur heard.   No gallop.  Pulmonary:     Effort: Pulmonary effort is normal. No accessory muscle usage or respiratory distress.     Breath sounds: Wheezing present.     Comments: Scattered expiratory wheezes noted, no rhonchi. Abdominal:     General: Bowel sounds are normal. There is no distension.     Palpations: Abdomen is soft.     Tenderness: There is no abdominal tenderness.  Musculoskeletal:     Cervical back: Normal range of motion and neck supple.     Right lower leg: No edema.     Left lower leg: No edema.  Lymphadenopathy:     Cervical: No cervical adenopathy.  Skin:    General: Skin is warm and dry.  Neurological:     Mental Status: She is alert and oriented to person, place, and time.     Cranial Nerves: Cranial nerves 2-12 are intact.     Coordination: Coordination is intact.     Gait: Gait is intact.     Deep Tendon Reflexes: Reflexes are normal and symmetric.     Reflex Scores:      Brachioradialis reflexes are 2+ on the right side and 2+ on the left side.      Patellar reflexes are 2+ on the right  side and 2+ on the left side. Psychiatric:        Attention and Perception: Attention normal.        Mood and Affect: Mood normal.        Speech: Speech normal.        Behavior: Behavior normal. Behavior is cooperative.        Thought Content: Thought content normal.    Results for orders placed or performed in visit on 02/23/21  Veritor Flu A/B Waived  Result Value Ref Range  Influenza A Negative Negative   Influenza B Negative Negative  Comprehensive metabolic panel  Result Value Ref Range   Glucose 103 (H) 70 - 99 mg/dL   BUN 10 8 - 27 mg/dL   Creatinine, Ser 0.80 0.57 - 1.00 mg/dL   eGFR 78 >59 mL/min/1.73   BUN/Creatinine Ratio 13 12 - 28   Sodium 123 (L) 134 - 144 mmol/L   Potassium 3.6 3.5 - 5.2 mmol/L   Chloride 82 (L) 96 - 106 mmol/L   CO2 26 20 - 29 mmol/L   Calcium 10.1 8.7 - 10.3 mg/dL   Total Protein 7.0 6.0 - 8.5 g/dL   Albumin 4.2 3.7 - 4.7 g/dL   Globulin, Total 2.8 1.5 - 4.5 g/dL   Albumin/Globulin Ratio 1.5 1.2 - 2.2   Bilirubin Total 0.5 0.0 - 1.2 mg/dL   Alkaline Phosphatase 91 44 - 121 IU/L   AST 32 0 - 40 IU/L   ALT 23 0 - 32 IU/L  CBC with Differential/Platelet  Result Value Ref Range   WBC 9.0 3.4 - 10.8 x10E3/uL   RBC 3.93 3.77 - 5.28 x10E6/uL   Hemoglobin 12.6 11.1 - 15.9 g/dL   Hematocrit 35.2 34.0 - 46.6 %   MCV 90 79 - 97 fL   MCH 32.1 26.6 - 33.0 pg   MCHC 35.8 (H) 31.5 - 35.7 g/dL   RDW 12.2 11.7 - 15.4 %   Platelets 313 150 - 450 x10E3/uL   Neutrophils 67 Not Estab. %   Lymphs 25 Not Estab. %   Monocytes 8 Not Estab. %   Eos 0 Not Estab. %   Basos 0 Not Estab. %   Neutrophils Absolute 5.9 1.4 - 7.0 x10E3/uL   Lymphocytes Absolute 2.3 0.7 - 3.1 x10E3/uL   Monocytes Absolute 0.8 0.1 - 0.9 x10E3/uL   EOS (ABSOLUTE) 0.0 0.0 - 0.4 x10E3/uL   Basophils Absolute 0.0 0.0 - 0.2 x10E3/uL   Immature Granulocytes 0 Not Estab. %   Immature Grans (Abs) 0.0 0.0 - 0.1 x10E3/uL      Assessment & Plan:   Problem List Items Addressed This  Visit       Respiratory   COPD with acute exacerbation ([place]) - Primary    Acute, started Z-pack yesterday.  Flu negative and Covid pending.  Labs noting lower sodium, recommend increase sodium via diet at this time and remain off Wellbutrin.  Will plan on recheck of this Friday.  Obtain CXR to further assess. Suspect neck pain present due to coughing increased, will send in low dose Tizanidine for this, educated her on medication.  Recommend: - Increased rest - Increasing Fluids - Acetaminophen as needed for fever/pain.  - Salt water gargling, chloraseptic spray and throat lozenges - Mucinex or Coricidin - Stop Flonase and use saline nasal spray only due to nose bleed.   Return to office in 2 days for follow-up and repeat labs.      Relevant Orders   DG Chest 2 View     Other   Acute neck pain    Suspect related to cough recently, short script for low dose Tizanidine sent.  Educated her on this medication and use.      Hyponatremia    Noted on recent labs, increase sodium via diet at this time.  Return on Friday for visit and recheck. Will try to avoid ER setting and fluids unless needed or worsening symptoms -- patient prefers to avoid ER.      Nicotine dependence  I have recommended complete cessation of tobacco use. I have discussed various options available for assistance with tobacco cessation including over the counter methods (Nicotine gum, patch and lozenges). We also discussed prescription options (Chantix, Nicotine Inhaler / Nasal Spray). The patient is not interested in pursuing any prescription tobacco cessation options at this time.         Follow up plan: Return in about 2 days (around 02/26/2021) for COPD exacerbation + hyponatremia with Lauren.

## 2021-02-24 NOTE — Assessment & Plan Note (Signed)
Suspect related to cough recently, short script for low dose Tizanidine sent.  Educated her on this medication and use.

## 2021-02-25 NOTE — Progress Notes (Deleted)
Established Patient Office Visit  Subjective:  Patient ID: Kristi Clark, female    DOB: 1950-09-24  Age: 70 y.o. MRN: [id number]  CC: No chief complaint on file.   HPI Kristi Clark presents for follow up on COPD exacerbation, weakness, and low sodium level.   Past Medical History:  Diagnosis Date   AK (actinic keratosis) 10/25/2017   left forehead   BCC (basal cell carcinoma) 10/25/2017   left upper lateral eyelid, Moh's   COPD (chronic obstructive pulmonary disease) (HCC)    Depression    GERD (gastroesophageal reflux disease)    Hyperlipidemia    Hypertension    Menopause    Osteopenia    Tobacco abuse     Past Surgical History:  Procedure Laterality Date   CARDIAC CATHETERIZATION     CHOLECYSTECTOMY  2014   COLONOSCOPY WITH PROPOFOL N/A 01/16/2018   Procedure: COLONOSCOPY WITH PROPOFOL;  Surgeon: Lollie Sails, MD;  Location: [facility] ENDOSCOPY;  Service: Endoscopy;  Laterality: N/A;   ESOPHAGOGASTRODUODENOSCOPY (EGD) WITH PROPOFOL N/A 01/16/2018   Procedure: ESOPHAGOGASTRODUODENOSCOPY (EGD) WITH PROPOFOL;  Surgeon: Lollie Sails, MD;  Location: [facility] END[facility]COPY;  Service: Endoscopy;  Laterality: N/A;   heart stent     TOTAL ABDOMINAL HYSTERECTOMY      Family History  Problem Relation Age of Onset   Cancer Mother    Stroke Mother    Heart disease Father    Hyperlipidemia Father    Cancer Sister    Diabetes Sister    Breast cancer Sister 81   Diabetes Brother    Asthma Son    Cancer Son    Diabetes Daughter    Hypertension Daughter    Cancer Maternal Grandmother        gallbladder   Diabetes Brother    Heart disease Brother    Breast cancer Paternal Aunt     Social History   Socioeconomic History   Marital status: Widowed    Spouse name: Not on file   Number of children: Not on file   Years of education: Not on file   Highest education level: 10th grade  Occupational History   Occupation: retired  Tobacco Use   Smoking status:  Every Day    Packs/day: 1.00    Years: 56.00    Pack years: 56.00    Types: Cigarettes   Smokeless tobacco: Never   Tobacco comments:    has the gum and patch.   Vaping Use   Vaping Use: Never used  Substance and Sexual Activity   Alcohol use: No   Drug use: No   Sexual activity: Never  Other Topics Concern   Not on file  Social History Narrative   Not on file   Social Determinants of Health   Financial Resource Strain: Low Risk    Difficulty of Paying Living Expenses: Not hard at all  Food Insecurity: No Food Insecurity   Worried About Charity fundraiser in the Last Year: Never true   [place] in the Last Year: Never true  Transportation Needs: No Transportation Needs   Lack of Transportation (Medical): No   Lack of Transportation (Non-Medical): No  Physical Activity: Inactive   Days of Exercise per Week: 0 days   Minutes of Exercise per Session: 0 min  Stress: No Stress Concern Present   Feeling of Stress : Not at all  Social Connections: Not on file  Intimate Partner Violence: Not on file    Outpatient  Medications Prior to Visit  Medication Sig Dispense Refill   albuterol (VENTOLIN HFA) 108 (90 Base) MCG/ACT inhaler Inhale 2 puffs into the lungs every 6 (six) hours as needed for wheezing or shortness of breath. 18 g 3   aspirin 81 MG tablet Take 81 mg by mouth daily.     atorvastatin (LIPITOR) 40 MG tablet Take 1 tablet (40 mg total) by mouth daily. 90 tablet 1   azithromycin (ZITHROMAX) 250 MG tablet Take 2 tablets on day 1, then 1 tablet daily on days 2 through 5 6 tablet 0   Blood Pressure Monitoring (BLOOD PRESSURE MONITOR/WRIST) KIT Take Blood pressure as needed, Dx: I12.9 1 kit 0   Calcium Carbonate (CALCIUM 600 PO) Take by mouth daily.     Cetirizine HCl (ZYRTEC PO) Take by mouth daily.     clopidogrel (PLAVIX) 75 MG tablet Take 1 tablet (75 mg total) by mouth daily. 90 tablet 1   diclofenac Sodium (VOLTAREN) 1 % GEL Apply 4 g topically 4 (four) times  daily. 100 g 3   fluticasone (FLONASE) 50 MCG/ACT nasal spray Place 2 sprays into both nostrils daily. 3 g 3   fluticasone-salmeterol (ADVAIR DISKUS) 250-50 MCG/ACT AEPB Inhale 1 puff into the lungs in the morning and at bedtime. 60 each 3   hydrALAZINE (APRESOLINE) 25 MG tablet Take 25 mg by mouth 2 (two) times daily.     isosorbide mononitrate (IMDUR) 60 MG 24 hr tablet Take 1 tablet (60 mg total) by mouth daily. 90 tablet 1   losartan (COZAAR) 100 MG tablet Take 1 tablet (100 mg total) by mouth daily. 90 tablet 1   metoprolol succinate (TOPROL-XL) 50 MG 24 hr tablet Take 1 tablet (50 mg total) by mouth daily. Take with or immediately following a meal. 90 tablet 1   Multiple Vitamins-Minerals (CENTRUM SILVER ADULT 50+ PO) Take 1 tablet by mouth daily.     nystatin (MYCOSTATIN) 100000 UNIT/ML suspension Take 5 mLs (500,000 Units total) by mouth 4 (four) times daily. (Patient not taking: Reported on 02/08/2021) 60 mL 0   oseltamivir (TAMIFLU) 75 MG capsule Take 75 mg by mouth 2 (two) times daily.     pantoprazole (PROTONIX) 40 MG tablet TAKE ONE TABLET BY MOUTH EVERY DAY 90 tablet 2   polyethylene glycol powder (GLYCOLAX/MIRALAX) 17 GM/SCOOP powder Take 17 g by mouth daily as needed for moderate constipation. 3350 g 1   Probiotic Product (PROBIOTIC-10 PO) Take by mouth. Every other day     tiotropium (SPIRIVA) 18 MCG inhalation capsule Place 1 capsule (18 mcg total) into inhaler and inhale daily. 90 capsule 3   tizanidine (ZANAFLEX) 2 MG capsule Take 1 capsule (2 mg total) by mouth 3 (three) times daily as needed for muscle spasms. 30 capsule 0   No facility-administered medications prior to visit.    Allergies  Allergen Reactions   Clindamycin/Lincomycin Hives   Prednisone Other (See Comments)    Psychosis   Amoxicillin Rash   Avelox [Moxifloxacin Hcl In Nacl] Rash   Codeine Sulfate Nausea Only   Penicillins Rash   Sulfa Antibiotics Rash    ROS Review of Systems    Objective:     Physical Exam  LMP  (LMP Unknown)  Wt Readings from Last 3 Encounters:  02/24/21 110 lb (49.9 kg)  02/23/21 110 lb 9.6 oz (50.2 kg)  02/18/21 112 lb (50.8 kg)     Health Maintenance Due  Topic Date Due   Zoster Vaccines- Shingrix (1 of 2)  Never done   COVID-19 Vaccine (6 - Booster) 04/02/2020    There are no preventive care reminders to display for this patient.  Lab Results  Component Value Date   TSH 0.829 12/23/2019   Lab Results  Component Value Date   WBC 9.0 02/23/2021   HGB 12.6 02/23/2021   HCT 35.2 02/23/2021   MCV 90 02/23/2021   PLT 313 02/23/2021   Lab Results  Component Value Date   NA 123 (L) 02/23/2021   K 3.6 02/23/2021   CO2 26 02/23/2021   GLUCOSE 103 (H) 02/23/2021   BUN 10 02/23/2021   CREATININE 0.80 02/23/2021   BILITOT 0.5 02/23/2021   ALKPHOS 91 02/23/2021   AST 32 02/23/2021   ALT 23 02/23/2021   PROT 7.0 02/23/2021   ALBUMIN 4.2 02/23/2021   CALCIUM 10.1 02/23/2021   ANIONGAP 9 03/02/2020   EGFR 78 02/23/2021   Lab Results  Component Value Date   CHOL 135 08/31/2020   Lab Results  Component Value Date   HDL 65 08/31/2020   Lab Results  Component Value Date   LDLCALC 55 08/31/2020   Lab Results  Component Value Date   TRIG 77 08/31/2020   No results found for: CHOLHDL No results found for: HGBA1C    Assessment & Plan:   Problem List Items Addressed This Visit   None   No orders of the defined types were placed in this encounter.   Follow-up: No follow-ups on file.    Charyl Dancer, NP

## 2021-02-25 NOTE — Progress Notes (Signed)
Contacted via MyChart   Good morning Kristi Clark, your x-ray returned and shows no acute pneumonia.  Does show your underlying COPD, continue current medications and keep follow-up tomorrow with Lauren to recheck labs.  Any questions? Keep being amazing!!  Thank you for allowing me to participate in your care.  I appreciate you. Kindest regards, Nuala Chiles

## 2021-02-26 ENCOUNTER — Ambulatory Visit: Payer: Medicare Other | Admitting: Nurse Practitioner

## 2021-03-02 ENCOUNTER — Other Ambulatory Visit: Payer: Self-pay

## 2021-03-02 ENCOUNTER — Encounter: Payer: Self-pay | Admitting: Nurse Practitioner

## 2021-03-02 ENCOUNTER — Ambulatory Visit: Payer: Medicare Other | Admitting: Family Medicine

## 2021-03-02 ENCOUNTER — Ambulatory Visit (INDEPENDENT_AMBULATORY_CARE_PROVIDER_SITE_OTHER): Payer: Medicare Other | Admitting: Nurse Practitioner

## 2021-03-02 VITALS — BP 160/60 | HR 76 | Temp 98.2°F | Wt 112.4 lb

## 2021-03-02 DIAGNOSIS — R21 Rash and other nonspecific skin eruption: Secondary | ICD-10-CM

## 2021-03-02 DIAGNOSIS — J449 Chronic obstructive pulmonary disease, unspecified: Secondary | ICD-10-CM | POA: Diagnosis not present

## 2021-03-02 DIAGNOSIS — E871 Hypo-osmolality and hyponatremia: Secondary | ICD-10-CM

## 2021-03-02 DIAGNOSIS — R0602 Shortness of breath: Secondary | ICD-10-CM | POA: Diagnosis not present

## 2021-03-02 DIAGNOSIS — B370 Candidal stomatitis: Secondary | ICD-10-CM | POA: Diagnosis not present

## 2021-03-02 DIAGNOSIS — I129 Hypertensive chronic kidney disease with stage 1 through stage 4 chronic kidney disease, or unspecified chronic kidney disease: Secondary | ICD-10-CM | POA: Diagnosis not present

## 2021-03-02 DIAGNOSIS — I10 Essential (primary) hypertension: Secondary | ICD-10-CM | POA: Diagnosis not present

## 2021-03-02 DIAGNOSIS — E782 Mixed hyperlipidemia: Secondary | ICD-10-CM | POA: Diagnosis not present

## 2021-03-02 DIAGNOSIS — I2510 Atherosclerotic heart disease of native coronary artery without angina pectoris: Secondary | ICD-10-CM | POA: Diagnosis not present

## 2021-03-02 MED ORDER — NYSTATIN 100000 UNIT/ML MT SUSP: 5.0000 mL | Freq: Four times a day (QID) | OROMUCOSAL | 0 refills | Status: DC

## 2021-03-02 MED ORDER — TRIAMCINOLONE ACETONIDE 0.1 % EX CREA: 1.0000 "application " | TOPICAL_CREAM | Freq: Two times a day (BID) | CUTANEOUS | 0 refills | Status: DC

## 2021-03-02 NOTE — Assessment & Plan Note (Signed)
Acute exacerbation has resolved. Continue maintenance inhalers and albuterol as needed.

## 2021-03-02 NOTE — Assessment & Plan Note (Addendum)
Chronic, not controlled. Blood pressure elevated today, 160/60. She has stopped her indapamide and been increasing the amount of sodium she has been eating due to low sodium levels. Check BMP today. Recommended that she stop eating extra sodium now and continue to monitor her blood pressure daily. She has a follow up appointment with cardiology soon to recheck her blood pressure.

## 2021-03-02 NOTE — Progress Notes (Signed)
Established Patient Office Visit  Subjective:  Patient ID: Kristi Clark, female    DOB: 1951/01/21  Age: 70 y.o. MRN: [id number]  CC:  Chief Complaint  Patient presents with   COPD   Hypertension   Rash    Pt states she has a rash on her R shin she would like looked at, states it has been there for a while now     HPI Kristi Clark presents for follow up on COPD exacerbation, hypertension, and a rash.   HYPERTENSION  She has not been checking her blood pressure at home, as she has been busy the past few days. She has increased the amount of salt that she has been eating recently to help with her sodium levels. She denies shortness of breath, chest pain, headache, dizziness. Her weakness and unsteady gait has improved.   COPD EXACERBATION  Breathing and nasal congestion have improved. She states her breathing is back at baseline.   RASH  Duration:  months  Location: right leg  Itching: yes Burning: no Redness: yes Oozing: no Scaling: no Blisters: no Painful: no Fevers: no Change in detergents/soaps/personal care products: no Recent illness: no Recent travel:no History of same: no Context: stable Alleviating factors: lotion/moisturizer Treatments attempted:lotion/moisturizer Shortness of breath: no  Throat/tongue swelling: no Myalgias/arthralgias: no   Past Medical History:  Diagnosis Date   AK (actinic keratosis) 10/25/2017   left forehead   BCC (basal cell carcinoma) 10/25/2017   left upper lateral eyelid, Moh's   COPD (chronic obstructive pulmonary disease) (HCC)    Depression    GERD (gastroesophageal reflux disease)    Hyperlipidemia    Hypertension    Menopause    Osteopenia    Tobacco abuse     Past Surgical History:  Procedure Laterality Date   CARDIAC CATHETERIZATION     CHOLECYSTECTOMY  2014   COLONOSCOPY WITH PROPOFOL N/A 01/16/2018   Procedure: COLONOSCOPY WITH PROPOFOL;  Surgeon: Lollie Sails, MD;  Location: [facility]  ENDOSCOPY;  Service: Endoscopy;  Laterality: N/A;   ESOPHAGOGASTRODUODENOSCOPY (EGD) WITH PROPOFOL N/A 01/16/2018   Procedure: ESOPHAGOGASTRODUODENOSCOPY (EGD) WITH PROPOFOL;  Surgeon: Lollie Sails, MD;  Location: [facility] ENDOSCOPY;  Service: Endoscopy;  Laterality: N/A;   heart stent     TOTAL ABDOMINAL HYSTERECTOMY      Family History  Problem Relation Age of Onset   Cancer Mother    Stroke Mother    Heart disease Father    Hyperlipidemia Father    Cancer Sister    Diabetes Sister    Breast cancer Sister 14   Diabetes Brother    Asthma Son    Cancer Son    Diabetes Daughter    Hypertension Daughter    Cancer Maternal Grandmother        gallbladder   Diabetes Brother    Heart disease Brother    Breast cancer Paternal Aunt     Social History   Socioeconomic History   Marital status: Widowed    Spouse name: Not on file   Number of children: Not on file   Years of education: Not on file   Highest education level: 10th grade  Occupational History   Occupation: retired  Tobacco Use   Smoking status: Every Day    Packs/day: 1.00    Years: 56.00    Pack years: 56.00    Types: Cigarettes   Smokeless tobacco: Never   Tobacco comments:    has the gum and patch.   Vaping  Use   Vaping Use: Never used  Substance and Sexual Activity   Alcohol use: No   Drug use: No   Sexual activity: Never  Other Topics Concern   Not on file  Social History Narrative   Not on file   Social Determinants of Health   Financial Resource Strain: Low Risk    Difficulty of Paying Living Expenses: Not hard at all  Food Insecurity: No Food Insecurity   Worried About Charity fundraiser in the Last Year: Never true   [place] in the Last Year: Never true  Transportation Needs: No Transportation Needs   Lack of Transportation (Medical): No   Lack of Transportation (Non-Medical): No  Physical Activity: Inactive   Days of Exercise per Week: 0 days   Minutes of Exercise per  Session: 0 min  Stress: No Stress Concern Present   Feeling of Stress : Not at all  Social Connections: Not on file  Intimate Partner Violence: Not on file    Outpatient Medications Prior to Visit  Medication Sig Dispense Refill   albuterol (VENTOLIN HFA) 108 (90 Base) MCG/ACT inhaler Inhale 2 puffs into the lungs every 6 (six) hours as needed for wheezing or shortness of breath. 18 g 3   aspirin 81 MG tablet Take 81 mg by mouth daily.     atorvastatin (LIPITOR) 40 MG tablet Take 1 tablet (40 mg total) by mouth daily. 90 tablet 1   Blood Pressure Monitoring (BLOOD PRESSURE MONITOR/WRIST) KIT Take Blood pressure as needed, Dx: I12.9 1 kit 0   Calcium Carbonate (CALCIUM 600 PO) Take by mouth daily.     Cetirizine HCl (ZYRTEC PO) Take by mouth daily.     clopidogrel (PLAVIX) 75 MG tablet Take 1 tablet (75 mg total) by mouth daily. 90 tablet 1   diclofenac Sodium (VOLTAREN) 1 % GEL Apply 4 g topically 4 (four) times daily. 100 g 3   fluticasone (FLONASE) 50 MCG/ACT nasal spray Place 2 sprays into both nostrils daily. 3 g 3   fluticasone-salmeterol (ADVAIR DISKUS) 250-50 MCG/ACT AEPB Inhale 1 puff into the lungs in the morning and at bedtime. 60 each 3   hydrALAZINE (APRESOLINE) 25 MG tablet Take 25 mg by mouth 2 (two) times daily.     isosorbide mononitrate (IMDUR) 60 MG 24 hr tablet Take 1 tablet (60 mg total) by mouth daily. 90 tablet 1   losartan (COZAAR) 100 MG tablet Take 1 tablet (100 mg total) by mouth daily. 90 tablet 1   metoprolol succinate (TOPROL-XL) 50 MG 24 hr tablet Take 1 tablet (50 mg total) by mouth daily. Take with or immediately following a meal. 90 tablet 1   Multiple Vitamins-Minerals (CENTRUM SILVER ADULT 50+ PO) Take 1 tablet by mouth daily.     pantoprazole (PROTONIX) 40 MG tablet TAKE ONE TABLET BY MOUTH EVERY DAY 90 tablet 2   polyethylene glycol powder (GLYCOLAX/MIRALAX) 17 GM/SCOOP powder Take 17 g by mouth daily as needed for moderate constipation. 3350 g 1    Probiotic Product (PROBIOTIC-10 PO) Take by mouth. Every other day     tiotropium (SPIRIVA) 18 MCG inhalation capsule Place 1 capsule (18 mcg total) into inhaler and inhale daily. 90 capsule 3   tizanidine (ZANAFLEX) 2 MG capsule Take 1 capsule (2 mg total) by mouth 3 (three) times daily as needed for muscle spasms. 30 capsule 0   nystatin (MYCOSTATIN) 100000 UNIT/ML suspension Take 5 mLs (500,000 Units total) by mouth 4 (four) times  daily. (Patient not taking: Reported on 02/08/2021) 60 mL 0   oseltamivir (TAMIFLU) 75 MG capsule Take 75 mg by mouth 2 (two) times daily.     No facility-administered medications prior to visit.    Allergies  Allergen Reactions   Clindamycin/Lincomycin Hives   Prednisone Other (See Comments)    Psychosis   Amoxicillin Rash   Avelox [Moxifloxacin Hcl In Nacl] Rash   Codeine Sulfate Nausea Only   Penicillins Rash   Sulfa Antibiotics Rash    ROS Review of Systems  Constitutional:  Positive for fatigue.  HENT:  Positive for rhinorrhea. Negative for congestion, ear pain and sore throat.        Sores on tongue, thinks she may have thrush  Eyes: Negative.   Respiratory: Negative.    Cardiovascular: Negative.   Gastrointestinal: Negative.   Genitourinary: Negative.   Musculoskeletal: Negative.   Skin:  Positive for rash (right lower leg).  Neurological: Negative.      Objective:    Physical Exam Vitals and nursing note reviewed.  Constitutional:      General: She is not in acute distress.    Appearance: Normal appearance.  HENT:     Head: Normocephalic and atraumatic.     Mouth/Throat:     Comments: Whitish yellow coating on tongue Eyes:     Conjunctiva/sclera: Conjunctivae normal.  Cardiovascular:     Rate and Rhythm: Normal rate and regular rhythm.     Pulses: Normal pulses.     Heart sounds: Normal heart sounds.  Pulmonary:     Effort: Pulmonary effort is normal.     Breath sounds: Normal breath sounds.  Musculoskeletal:     Cervical  back: Normal range of motion.  Skin:    General: Skin is warm and dry.     Findings: Rash present.     Comments: Small area of redness to right anterior shin  Neurological:     General: No focal deficit present.     Mental Status: She is alert and oriented to person, place, and time.  Psychiatric:        Mood and Affect: Mood normal.        Behavior: Behavior normal.        Thought Content: Thought content normal.        Judgment: Judgment normal.    BP (!) 160/60 (BP Location: Left Arm, Cuff Size: Small)    Pulse 76    Temp 98.2 F (36.8 C) (Oral)    Wt 112 lb 6.4 oz (51 kg)    LMP  (LMP Unknown)    SpO2 98%    BMI 23.12 kg/m  Wt Readings from Last 3 Encounters:  03/02/21 112 lb 6.4 oz (51 kg)  02/24/21 110 lb (49.9 kg)  02/23/21 110 lb 9.6 oz (50.2 kg)     Health Maintenance Due  Topic Date Due   Zoster Vaccines- Shingrix (1 of 2) Never done   COVID-19 Vaccine (6 - Booster) 04/02/2020    There are no preventive care reminders to display for this patient.  Lab Results  Component Value Date   TSH 0.829 12/23/2019   Lab Results  Component Value Date   WBC 9.0 02/23/2021   HGB 12.6 02/23/2021   HCT 35.2 02/23/2021   MCV 90 02/23/2021   PLT 313 02/23/2021   Lab Results  Component Value Date   NA 123 (L) 02/23/2021   K 3.6 02/23/2021   CO2 26 02/23/2021   GLUCOSE 103 (H)  02/23/2021   BUN 10 02/23/2021   CREATININE 0.80 02/23/2021   BILITOT 0.5 02/23/2021   ALKPHOS 91 02/23/2021   AST 32 02/23/2021   ALT 23 02/23/2021   PROT 7.0 02/23/2021   ALBUMIN 4.2 02/23/2021   CALCIUM 10.1 02/23/2021   ANIONGAP 9 03/02/2020   EGFR 78 02/23/2021   Lab Results  Component Value Date   CHOL 135 08/31/2020   Lab Results  Component Value Date   HDL 65 08/31/2020   Lab Results  Component Value Date   LDLCALC 55 08/31/2020   Lab Results  Component Value Date   TRIG 77 08/31/2020   No results found for: CHOLHDL No results found for: HGBA1C    Assessment &  Plan:   Problem List Items Addressed This Visit       Cardiovascular and Mediastinum   Benign hypertension with chronic kidney disease    Chronic, not controlled. Blood pressure elevated today, 160/60. She has stopped her indapamide and been increasing the amount of sodium she has been eating due to low sodium levels. Check BMP today. Recommended that she stop eating extra sodium now and continue to monitor her blood pressure daily. She has a follow up appointment with cardiology soon to recheck her blood pressure.         Respiratory   COPD, severe ([place])    Acute exacerbation has resolved. Continue maintenance inhalers and albuterol as needed.         Other   Hyponatremia - Primary    Check BMP today and treat based on results.       Relevant Orders   Basic Metabolic Panel (BMET)   Other Visit Diagnoses     Rash       Will treat with triamcinolone cream. Continue lotion daily after shower. F/U if not improving   Thrush       Treat with nystatin rinse and swallow. Most likely due to recent antibiotics. F/U if not improving   Relevant Medications   nystatin (MYCOSTATIN) 100000 UNIT/ML suspension       Meds ordered this encounter  Medications   nystatin (MYCOSTATIN) 100000 UNIT/ML suspension    Sig: Take 5 mLs (500,000 Units total) by mouth 4 (four) times daily.    Dispense:  60 mL    Refill:  0   triamcinolone cream (KENALOG) 0.1 %    Sig: Apply 1 application topically 2 (two) times daily.    Dispense:  30 g    Refill:  0    Follow-up: Return if symptoms worsen or fail to improve.    Charyl Dancer, NP

## 2021-03-02 NOTE — Assessment & Plan Note (Signed)
Check BMP today and treat based on results.

## 2021-03-03 LAB — BASIC METABOLIC PANEL
BUN/Creatinine Ratio: 19 (ref 12–28)
BUN: 14 mg/dL (ref 8–27)
CO2: 27 mmol/L (ref 20–29)
Calcium: 9.9 mg/dL (ref 8.7–10.3)
Chloride: 94 mmol/L — ABNORMAL LOW (ref 96–106)
Creatinine, Ser: 0.75 mg/dL (ref 0.57–1.00)
Glucose: 87 mg/dL (ref 70–99)
Potassium: 4 mmol/L (ref 3.5–5.2)
Sodium: 133 mmol/L — ABNORMAL LOW (ref 134–144)
eGFR: 85 mL/min/{1.73_m2} (ref >59)

## 2021-03-08 ENCOUNTER — Other Ambulatory Visit: Payer: Self-pay | Admitting: Family Medicine

## 2021-03-08 ENCOUNTER — Encounter: Payer: Self-pay | Admitting: Family Medicine

## 2021-03-09 ENCOUNTER — Ambulatory Visit
Admission: RE | Admit: 2021-03-09 | Discharge: 2021-03-09 | Disposition: A | Discharge status: Home / Self Care | Payer: Medicare Other | Source: Ambulatory Visit | Attending: Family Medicine | Admitting: Family Medicine

## 2021-03-09 ENCOUNTER — Other Ambulatory Visit: Payer: Self-pay

## 2021-03-09 ENCOUNTER — Encounter: Payer: Self-pay | Admitting: Family Medicine

## 2021-03-09 ENCOUNTER — Ambulatory Visit: Payer: Self-pay

## 2021-03-09 ENCOUNTER — Ambulatory Visit (INDEPENDENT_AMBULATORY_CARE_PROVIDER_SITE_OTHER): Payer: Medicare Other | Admitting: Family Medicine

## 2021-03-09 VITALS — BP 153/67 | HR 80 | Temp 98.2°F | Wt 112.0 lb

## 2021-03-09 DIAGNOSIS — I700 Atherosclerosis of aorta: Secondary | ICD-10-CM

## 2021-03-09 DIAGNOSIS — I129 Hypertensive chronic kidney disease with stage 1 through stage 4 chronic kidney disease, or unspecified chronic kidney disease: Secondary | ICD-10-CM

## 2021-03-09 DIAGNOSIS — E871 Hypo-osmolality and hyponatremia: Secondary | ICD-10-CM | POA: Diagnosis not present

## 2021-03-09 DIAGNOSIS — R600 Localized edema: Secondary | ICD-10-CM | POA: Insufficient documentation

## 2021-03-09 DIAGNOSIS — N182 Chronic kidney disease, stage 2 (mild): Secondary | ICD-10-CM | POA: Diagnosis not present

## 2021-03-09 DIAGNOSIS — R0602 Shortness of breath: Secondary | ICD-10-CM | POA: Insufficient documentation

## 2021-03-09 DIAGNOSIS — H6121 Impacted cerumen, right ear: Secondary | ICD-10-CM

## 2021-03-09 DIAGNOSIS — J449 Chronic obstructive pulmonary disease, unspecified: Secondary | ICD-10-CM | POA: Diagnosis not present

## 2021-03-09 DIAGNOSIS — E785 Hyperlipidemia, unspecified: Secondary | ICD-10-CM | POA: Diagnosis not present

## 2021-03-09 MED ORDER — HYDRALAZINE HCL 25 MG PO TABS: 25.0000 mg | ORAL_TABLET | Freq: Two times a day (BID) | ORAL | 1 refills | Status: DC

## 2021-03-09 MED ORDER — LOSARTAN POTASSIUM 100 MG PO TABS: 100.0000 mg | ORAL_TABLET | Freq: Every day | ORAL | 1 refills | Status: DC

## 2021-03-09 MED ORDER — ISOSORBIDE MONONITRATE ER 60 MG PO TB24: 60.0000 mg | ORAL_TABLET | Freq: Every day | ORAL | 1 refills | Status: DC

## 2021-03-09 MED ORDER — ALBUTEROL SULFATE HFA 108 (90 BASE) MCG/ACT IN AERS: 2.0000 | INHALATION_SPRAY | Freq: Four times a day (QID) | RESPIRATORY_TRACT | 3 refills | Status: DC | PRN

## 2021-03-09 MED ORDER — ATORVASTATIN CALCIUM 40 MG PO TABS: 40.0000 mg | ORAL_TABLET | Freq: Every day | ORAL | 1 refills | Status: DC

## 2021-03-09 NOTE — Telephone Encounter (Signed)
Patient had appointment 03/09/21 with Dr. Wynetta Emery for edema and SOB.

## 2021-03-09 NOTE — Assessment & Plan Note (Signed)
Under good control on current regimen. Continue current regimen. Continue to monitor. Call with any concerns. Refills given. Labs drawn today.   

## 2021-03-09 NOTE — Assessment & Plan Note (Signed)
Lungs clear today. Continue to monitor. Call with any concerns.  

## 2021-03-09 NOTE — Progress Notes (Signed)
Interpreted by me on 03/09/21. Normal sinus rhythm at 76bpm. No ST segment changes.

## 2021-03-09 NOTE — Telephone Encounter (Signed)
Chief Complaint: SOB with activity, bilateral foot swelling up to knees Symptoms: swelling feels tight, redness underneath foot,  Frequency: since Friday Pertinent Negatives: Patient denies chest pain Disposition: [] ED /[] Urgent Care (no appt availability in office) / [x] Appointment(In office/virtual)/ []  [place]/ [] Home Care/ [] Refused Recommended Disposition /[] [place] [facility]/ []  Follow-up with PCP Additional Notes: in office appt.       Reason for Disposition  [1] MODERATE leg swelling (e.g., swelling extends up to knees) AND [2] new-onset or worsening  Answer Assessment - Initial Assessment Questions 1. RESPIRATORY STATUS: "Describe your breathing?" (e.g., wheezing, shortness of breath, unable to speak, severe coughing)      SOB with activity 2. ONSET: "When did this breathing problem begin?"      Friday 3. PATTERN "Does the difficult breathing come and go, or has it been constant since it started?"     Comes and goes mild 4. SEVERITY: "How bad is your breathing?" (e.g., mild, moderate, severe)    - MILD: No SOB at rest, mild SOB with walking, speaks normally in sentences, can lie down, no retractions, pulse < 100.    - MODERATE: SOB at rest, SOB with minimal exertion and prefers to sit, cannot lie down flat, speaks in phrases, mild retractions, audible wheezing, pulse 100-120.    - SEVERE: Very SOB at rest, speaks in single words, struggling to breathe, sitting hunched forward, retractions, pulse > 120      mild 5. RECURRENT SYMPTOM: "Have you had difficulty breathing before?" If Yes, ask: "When was the last time?" and "What happened that time?"      yes 6. CARDIAC HISTORY: "Do you have any history of heart disease?" (e.g., heart attack, angina, bypass surgery, angioplasty)      Yes HTN 7. LUNG HISTORY: "Do you have any history of lung disease?"  (e.g., pulmonary embolus, asthma, emphysema)     COPD, nodules 8. CAUSE: "What do you think is causing the  breathing problem?"      Stress,  9. OTHER SYMPTOMS: "Do you have any other symptoms? (e.g., dizziness, runny nose, cough, chest pain, fever)     Cough,  10. O2 SATURATION MONITOR:  "Do you use an oxygen saturation monitor (pulse oximeter) at home?" If Yes, "What is your reading (oxygen level) today?" "What is your usual oxygen saturation reading?" (e.g., 95%)       95% 1 12. TRAVEL: "Have you traveled out of the country in the last month?" (e.g., travel history, exposures)       no  Answer Assessment - Initial Assessment Questions 1. ONSET: "When did the swelling start?" (e.g., minutes, hours, days)     4 days 2. LOCATION: "What part of the leg is swollen?"  "Are both legs swollen or just one leg?"     To knee 3. SEVERITY: "How bad is the swelling?" (e.g., localized; mild, moderate, severe)  - Localized - small area of swelling localized to one leg  - MILD pedal edema - swelling limited to foot and ankle, pitting edema < 1/4 inch (6 mm) deep, rest and elevation eliminate most or all swelling  - MODERATE edema - swelling of lower leg to knee, pitting edema > 1/4 inch (6 mm) deep, rest and elevation only partially reduce swelling  - SEVERE edema - swelling extends above knee, facial or hand swelling present      Moderate right worse than  4. REDNESS: "Does the swelling look red or infected?"     no  5. PAIN: "Is the swelling painful to touch?" If Yes, ask: "How painful is it?"   (Scale 1-10; mild, moderate or severe)     Feet burning 6. FEVER: "Do you have a fever?" If Yes, ask: "What is it, how was it measured, and when did it start?"      no 7. CAUSE: "What do you think is causing the leg swelling?"     standing 8. MEDICAL HISTORY: "Do you have a history of heart failure, kidney disease, liver failure, or cancer?"     HTN 9. RECURRENT SYMPTOM: "Have you had leg swelling before?" If Yes, ask: "When was the last time?" "What happened that time?"     Yes, fluid pill that was dc'd 10.  OTHER SYMPTOMS: "Do you have any other symptoms?" (e.g., chest pain, difficulty breathing)       SOB with activity 11. PREGNANCY: "Is there any chance you are pregnant?" "When was your last menstrual period?"       *No Answer*  Protocols used: Breathing Difficulty-A-AH, Leg Swelling and Edema-A-AH

## 2021-03-09 NOTE — Assessment & Plan Note (Signed)
Will keep BP and cholesterol under good control. Continue to monitor. Call with any concerns.  

## 2021-03-09 NOTE — Patient Instructions (Signed)
[place] appointment TODAY [facility] at 2:15  [place], [place]

## 2021-03-09 NOTE — Progress Notes (Signed)
BP (!) 153/67    Pulse 80    Temp 98.2 F (36.8 C)    Wt 112 lb (50.8 kg)    LMP  (LMP Unknown)    SpO2 95%    BMI 23.04 kg/m    Subjective:    Patient ID: Kristi Clark, female    DOB: 1950/12/15, 71 y.o.   MRN: [id number]  HPI: Kristi Clark is a 71 y.o. female  Chief Complaint  Patient presents with   Edema    Patient states her legs and feet have been swelling for about a week, right side is worse.    Shortness of Breath    Patient states she has been having short of breath when walking   SHORTNESS OF BREATH Duration: few days Onset: sudden Description of breathing discomfort: SOB Severity: moderate Episode duration: days Frequency: chronic Related to exertion: yes Cough: no Chest tightness: no Wheezing: no Fevers: no Chest pain: no Palpitations: no  Nausea: no Diaphoresis: no Deconditioning: yes Status: worse Aggravating factors: being on her feet Alleviating factors: putting her feet up Treatments attempted: none  HYPERTENSION / HYPERLIPIDEMIA Satisfied with current treatment? yes Duration of hypertension: chronic BP monitoring frequency: not checking BP medication side effects: no Duration of hyperlipidemia: chronic Cholesterol medication side effects: no Cholesterol supplements: none Medication compliance: excellent compliance Aspirin: no Recent stressors: no Recurrent headaches: no Visual changes: no Palpitations: no Dyspnea: no Chest pain: no Lower extremity edema: no Dizzy/lightheaded: no   Relevant past medical, surgical, family and social history reviewed and updated as indicated. Interim medical history since our last visit reviewed. Allergies and medications reviewed and updated.  Review of Systems  Constitutional: Negative.   Respiratory:  Positive for shortness of breath. Negative for apnea, cough, choking, chest tightness, wheezing and stridor.   Cardiovascular:  Positive for leg swelling. Negative for chest pain and  palpitations.  Gastrointestinal: Negative.   Musculoskeletal: Negative.   Neurological: Negative.   Psychiatric/Behavioral: Negative.     Per HPI unless specifically indicated above     Objective:    BP (!) 153/67    Pulse 80    Temp 98.2 F (36.8 C)    Wt 112 lb (50.8 kg)    LMP  (LMP Unknown)    SpO2 95%    BMI 23.04 kg/m   Wt Readings from Last 3 Encounters:  03/09/21 112 lb (50.8 kg)  03/02/21 112 lb 6.4 oz (51 kg)  02/24/21 110 lb (49.9 kg)    Physical Exam Vitals and nursing note reviewed.  Constitutional:      General: She is not in acute distress.    Appearance: Normal appearance. She is not ill-appearing, toxic-appearing or diaphoretic.  HENT:     Head: Normocephalic and atraumatic.     Right Ear: External ear normal. There is no impacted cerumen.     Left Ear: External ear normal. There is impacted cerumen.     Nose: Nose normal.     Mouth/Throat:     Mouth: Mucous membranes are moist.     Pharynx: Oropharynx is clear.  Eyes:     General: No scleral icterus.       Right eye: No discharge.        Left eye: No discharge.     Extraocular Movements: Extraocular movements intact.     Conjunctiva/sclera: Conjunctivae normal.     Pupils: Pupils are equal, round, and reactive to light.  Cardiovascular:     Rate and Rhythm: Normal  rate and regular rhythm.     Pulses: Normal pulses.     Heart sounds: Normal heart sounds. No murmur heard.   No friction rub. No gallop.  Pulmonary:     Effort: Pulmonary effort is normal. No respiratory distress.     Breath sounds: Normal breath sounds. No stridor. No wheezing, rhonchi or rales.  Chest:     Chest wall: No tenderness.  Musculoskeletal:        General: Normal range of motion.     Cervical back: Normal range of motion and neck supple.     Right lower leg: Edema (1+) present.     Left lower leg: Edema (trace) present.  Skin:    General: Skin is warm and dry.     Capillary Refill: Capillary refill takes less than 2  seconds.     Coloration: Skin is not jaundiced or pale.     Findings: No bruising, erythema, lesion or rash.  Neurological:     General: No focal deficit present.     Mental Status: She is alert and oriented to person, place, and time. Mental status is at baseline.  Psychiatric:        Mood and Affect: Mood normal.        Behavior: Behavior normal.        Thought Content: Thought content normal.        Judgment: Judgment normal.    Results for orders placed or performed in visit on 56/86/16  Basic Metabolic Panel (BMET)  Result Value Ref Range   Glucose 87 70 - 99 mg/dL   BUN 14 8 - 27 mg/dL   Creatinine, Ser 0.75 0.57 - 1.00 mg/dL   eGFR 85 >59 mL/min/1.73   BUN/Creatinine Ratio 19 12 - 28   Sodium 133 (L) 134 - 144 mmol/L   Potassium 4.0 3.5 - 5.2 mmol/L   Chloride 94 (L) 96 - 106 mmol/L   CO2 27 20 - 29 mmol/L   Calcium 9.9 8.7 - 10.3 mg/dL      Assessment & Plan:   Problem List Items Addressed This Visit       Cardiovascular and Mediastinum   Benign hypertension with chronic kidney disease    Running slightly high. Rechecking labs today. Await results. Treat as needed.       Relevant Medications   hydrALAZINE (APRESOLINE) 25 MG tablet   atorvastatin (LIPITOR) 40 MG tablet   isosorbide mononitrate (IMDUR) 60 MG 24 hr tablet   losartan (COZAAR) 100 MG tablet   Other Relevant Orders   Comprehensive metabolic panel   Aortic atherosclerosis (HCC)    Will keep BP and cholesterol under good control. Continue to monitor. Call with any concerns.       Relevant Medications   hydrALAZINE (APRESOLINE) 25 MG tablet   atorvastatin (LIPITOR) 40 MG tablet   isosorbide mononitrate (IMDUR) 60 MG 24 hr tablet   losartan (COZAAR) 100 MG tablet   Other Relevant Orders   Comprehensive metabolic panel     Respiratory   COPD, severe (HCC)    Lungs clear today. Continue to monitor. Call with any concerns.       Relevant Medications   albuterol (VENTOLIN HFA) 108 (90 Base)  MCG/ACT inhaler     Genitourinary   CKD (chronic kidney disease), stage II    Rechecking labs today. Await results. Treat as needed.         Other   Hyperlipidemia    Under good control on current  regimen. Continue current regimen. Continue to monitor. Call with any concerns. Refills given. Labs drawn today.       Relevant Medications   hydrALAZINE (APRESOLINE) 25 MG tablet   atorvastatin (LIPITOR) 40 MG tablet   isosorbide mononitrate (IMDUR) 60 MG 24 hr tablet   losartan (COZAAR) 100 MG tablet   Other Relevant Orders   Comprehensive metabolic panel   Lipid Panel w/o Chol/HDL Ratio   Hyponatremia    Rechecking labs today. Await results. Treat as needed.       Other Visit Diagnoses     SOB (shortness of breath)    -  Primary   EKG normal. Will check [place]. Continue to monitor. Call with any concerns.    Relevant Orders   EKG 12-Lead (Completed)   US Venous Img Lower Bilateral   B Nat Peptide   D-Dimer, Quantitative   Pedal edema       R>L, will check [place] to r/o DVT. Call with any concerns. Await results.    Relevant Orders   US Venous Img Lower Bilateral   B Nat Peptide   Hearing loss of right ear due to cerumen impaction       Ear flushed today with good results.         Follow up plan: Return in about 4 weeks (around 04/06/2021).

## 2021-03-09 NOTE — Assessment & Plan Note (Signed)
Rechecking labs today. Await results. Treat as needed.  °

## 2021-03-09 NOTE — Assessment & Plan Note (Signed)
Running slightly high. Rechecking labs today. Await results. Treat as needed.

## 2021-03-10 LAB — LIPID PANEL W/O CHOL/HDL RATIO
Cholesterol, Total: 126 mg/dL (ref 100–199)
HDL: 60 mg/dL (ref >39)
LDL Chol Calc (NIH): 50 mg/dL (ref 0–99)
Triglycerides: 82 mg/dL (ref 0–149)
VLDL Cholesterol Cal: 16 mg/dL (ref 5–40)

## 2021-03-10 LAB — COMPREHENSIVE METABOLIC PANEL
ALT: 25 IU/L (ref 0–32)
AST: 29 IU/L (ref 0–40)
Albumin/Globulin Ratio: 1.7 (ref 1.2–2.2)
Albumin: 4.3 g/dL (ref 3.7–4.7)
Alkaline Phosphatase: 81 IU/L (ref 44–121)
BUN/Creatinine Ratio: 15 (ref 12–28)
BUN: 12 mg/dL (ref 8–27)
Bilirubin Total: 0.2 mg/dL (ref 0.0–1.2)
CO2: 25 mmol/L (ref 20–29)
Calcium: 9.8 mg/dL (ref 8.7–10.3)
Chloride: 97 mmol/L (ref 96–106)
Creatinine, Ser: 0.82 mg/dL (ref 0.57–1.00)
Globulin, Total: 2.5 g/dL (ref 1.5–4.5)
Glucose: 84 mg/dL (ref 70–99)
Potassium: 4.5 mmol/L (ref 3.5–5.2)
Sodium: 135 mmol/L (ref 134–144)
Total Protein: 6.8 g/dL (ref 6.0–8.5)
eGFR: 76 mL/min/{1.73_m2} (ref >59)

## 2021-03-10 LAB — BRAIN NATRIURETIC PEPTIDE: BNP: 344 pg/mL — ABNORMAL HIGH (ref 0.0–100.0)

## 2021-03-10 LAB — D-DIMER, QUANTITATIVE: D-DIMER: 0.84 mg/L FEU — ABNORMAL HIGH (ref 0.00–0.49)

## 2021-03-11 ENCOUNTER — Encounter: Payer: Self-pay | Admitting: Family Medicine

## 2021-03-11 ENCOUNTER — Other Ambulatory Visit: Payer: Self-pay | Admitting: Family Medicine

## 2021-03-11 MED ORDER — FUROSEMIDE 20 MG PO TABS: 20.0000 mg | ORAL_TABLET | Freq: Every day | ORAL | 0 refills | Status: DC

## 2021-03-15 ENCOUNTER — Encounter: Payer: Self-pay | Admitting: Family Medicine

## 2021-03-15 ENCOUNTER — Ambulatory Visit (INDEPENDENT_AMBULATORY_CARE_PROVIDER_SITE_OTHER): Payer: Medicare Other | Admitting: Family Medicine

## 2021-03-15 ENCOUNTER — Other Ambulatory Visit: Payer: Self-pay

## 2021-03-15 VITALS — BP 132/62 | HR 90 | Temp 98.2°F | Wt 113.0 lb

## 2021-03-15 DIAGNOSIS — I509 Heart failure, unspecified: Secondary | ICD-10-CM | POA: Diagnosis not present

## 2021-03-15 DIAGNOSIS — I5032 Chronic diastolic (congestive) heart failure: Secondary | ICD-10-CM | POA: Insufficient documentation

## 2021-03-15 NOTE — Assessment & Plan Note (Signed)
Elevated BNP. Improved with lasix- will get her back into her cardiology for evaluation. Likely needs echo. Await results.

## 2021-03-15 NOTE — Progress Notes (Signed)
BP 132/62    Pulse 90    Temp 98.2 F (36.8 C)    Wt 113 lb (51.3 kg)    LMP  (LMP Unknown)    SpO2 97%    BMI 23.24 kg/m    Subjective:    Patient ID: Kristi Clark, female    DOB: Sep 03, 1950, 71 y.o.   MRN: [id number]  HPI: Kristi Clark is a 71 y.o. female  Chief Complaint  Patient presents with   Edema    Patient states leg swelling has come down but feet is still swollen    Shortness of Breath    Patient states she has been using inhalers and feeling better some    Doing much better after her lasix over the weekend. No more cough. SOB significantly better. Legs are less swollen, but still some swelling on the R. No fever. No chills. She has a cardiologist- Dr. Chancy Milroy. Had cardiac cath done some time ago, but doesn't remember the last time she had an ECHO. She knows she has a leaky valve, but no history of known CHF. No other concerns or complaints at this time.   Relevant past medical, surgical, family and social history reviewed and updated as indicated. Interim medical history since our last visit reviewed. Allergies and medications reviewed and updated.  Review of Systems  Constitutional: Negative.   HENT: Negative.    Respiratory:  Positive for shortness of breath. Negative for apnea, cough, choking, chest tightness, wheezing and stridor.   Cardiovascular:  Positive for leg swelling. Negative for chest pain and palpitations.  Gastrointestinal: Negative.   Musculoskeletal: Negative.   Neurological: Negative.   Psychiatric/Behavioral: Negative.     Per HPI unless specifically indicated above     Objective:    BP 132/62    Pulse 90    Temp 98.2 F (36.8 C)    Wt 113 lb (51.3 kg)    LMP  (LMP Unknown)    SpO2 97%    BMI 23.24 kg/m   Wt Readings from Last 3 Encounters:  03/15/21 113 lb (51.3 kg)  03/09/21 112 lb (50.8 kg)  03/02/21 112 lb 6.4 oz (51 kg)    Physical Exam Vitals and nursing note reviewed.  Constitutional:      General: She is not in acute  distress.    Appearance: Normal appearance. She is not ill-appearing, toxic-appearing or diaphoretic.  HENT:     Head: Normocephalic and atraumatic.     Right Ear: External ear normal.     Left Ear: External ear normal.     Nose: Nose normal.     Mouth/Throat:     Mouth: Mucous membranes are moist.     Pharynx: Oropharynx is clear.  Eyes:     General: No scleral icterus.       Right eye: No discharge.        Left eye: No discharge.     Extraocular Movements: Extraocular movements intact.     Conjunctiva/sclera: Conjunctivae normal.     Pupils: Pupils are equal, round, and reactive to light.  Cardiovascular:     Rate and Rhythm: Normal rate and regular rhythm.     Pulses: Normal pulses.     Heart sounds: Normal heart sounds. No murmur heard.   No friction rub. No gallop.  Pulmonary:     Effort: Pulmonary effort is normal. No respiratory distress.     Breath sounds: Normal breath sounds. No stridor. No wheezing, rhonchi or rales.  Chest:     Chest wall: No tenderness.  Musculoskeletal:        General: Normal range of motion.     Cervical back: Normal range of motion and neck supple.     Right lower leg: Edema (trace) present.     Left lower leg: No edema.  Skin:    General: Skin is warm and dry.     Capillary Refill: Capillary refill takes less than 2 seconds.     Coloration: Skin is not jaundiced or pale.     Findings: No bruising, erythema, lesion or rash.  Neurological:     General: No focal deficit present.     Mental Status: She is alert and oriented to person, place, and time. Mental status is at baseline.  Psychiatric:        Mood and Affect: Mood normal.        Behavior: Behavior normal.        Thought Content: Thought content normal.        Judgment: Judgment normal.    Results for orders placed or performed in visit on 03/09/21  B Nat Peptide  Result Value Ref Range   BNP 344.0 (H) 0.0 - 100.0 pg/mL  D-Dimer, Quantitative  Result Value Ref Range   D-DIMER  0.84 (H) 0.00 - 0.49 mg/L FEU  Comprehensive metabolic panel  Result Value Ref Range   Glucose 84 70 - 99 mg/dL   BUN 12 8 - 27 mg/dL   Creatinine, Ser 0.82 0.57 - 1.00 mg/dL   eGFR 76 >59 mL/min/1.73   BUN/Creatinine Ratio 15 12 - 28   Sodium 135 134 - 144 mmol/L   Potassium 4.5 3.5 - 5.2 mmol/L   Chloride 97 96 - 106 mmol/L   CO2 25 20 - 29 mmol/L   Calcium 9.8 8.7 - 10.3 mg/dL   Total Protein 6.8 6.0 - 8.5 g/dL   Albumin 4.3 3.7 - 4.7 g/dL   Globulin, Total 2.5 1.5 - 4.5 g/dL   Albumin/Globulin Ratio 1.7 1.2 - 2.2   Bilirubin Total 0.2 0.0 - 1.2 mg/dL   Alkaline Phosphatase 81 44 - 121 IU/L   AST 29 0 - 40 IU/L   ALT 25 0 - 32 IU/L  Lipid Panel w/o Chol/HDL Ratio  Result Value Ref Range   Cholesterol, Total 126 100 - 199 mg/dL   Triglycerides 82 0 - 149 mg/dL   HDL 60 >39 mg/dL   VLDL Cholesterol Cal 16 5 - 40 mg/dL   LDL Chol Calc (NIH) 50 0 - 99 mg/dL      Assessment & Plan:   Problem List Items Addressed This Visit       Cardiovascular and Mediastinum   Congestive heart failure (HCC) - Primary    Elevated BNP. Improved with lasix- will get her back into her cardiology for evaluation. Likely needs echo. Await results.       Relevant Orders   Basic metabolic panel     Follow up plan: Return As scheduled.

## 2021-03-15 NOTE — Patient Instructions (Addendum)
Appointment with Dr. Humphrey Rolls at [facility] on Friday 03/19/21 at 11:15 AM. Their phone number is [phone]. Their address is [address] in [place].

## 2021-03-16 LAB — BASIC METABOLIC PANEL
BUN/Creatinine Ratio: 12 (ref 12–28)
BUN: 11 mg/dL (ref 8–27)
CO2: 27 mmol/L (ref 20–29)
Calcium: 9.5 mg/dL (ref 8.7–10.3)
Chloride: 94 mmol/L — ABNORMAL LOW (ref 96–106)
Creatinine, Ser: 0.92 mg/dL (ref 0.57–1.00)
Glucose: 96 mg/dL (ref 70–99)
Potassium: 3.8 mmol/L (ref 3.5–5.2)
Sodium: 136 mmol/L (ref 134–144)
eGFR: 66 mL/min/{1.73_m2} (ref >59)

## 2021-03-19 DIAGNOSIS — I2510 Atherosclerotic heart disease of native coronary artery without angina pectoris: Secondary | ICD-10-CM | POA: Diagnosis not present

## 2021-03-19 DIAGNOSIS — J449 Chronic obstructive pulmonary disease, unspecified: Secondary | ICD-10-CM | POA: Diagnosis not present

## 2021-03-19 DIAGNOSIS — R609 Edema, unspecified: Secondary | ICD-10-CM | POA: Diagnosis not present

## 2021-03-19 DIAGNOSIS — Z9861 Coronary angioplasty status: Secondary | ICD-10-CM | POA: Diagnosis not present

## 2021-03-19 DIAGNOSIS — F17200 Nicotine dependence, unspecified, uncomplicated: Secondary | ICD-10-CM | POA: Diagnosis not present

## 2021-03-19 DIAGNOSIS — E782 Mixed hyperlipidemia: Secondary | ICD-10-CM | POA: Diagnosis not present

## 2021-03-19 DIAGNOSIS — I10 Essential (primary) hypertension: Secondary | ICD-10-CM | POA: Diagnosis not present

## 2021-03-26 ENCOUNTER — Encounter: Payer: Self-pay | Admitting: Family Medicine

## 2021-03-28 ENCOUNTER — Encounter: Payer: Self-pay | Admitting: Family Medicine

## 2021-03-29 MED ORDER — TIOTROPIUM BROMIDE MONOHYDRATE 18 MCG IN CAPS: 18.0000 ug | ORAL_CAPSULE | Freq: Every day | RESPIRATORY_TRACT | 3 refills | Status: DC

## 2021-04-05 DIAGNOSIS — I2510 Atherosclerotic heart disease of native coronary artery without angina pectoris: Secondary | ICD-10-CM | POA: Diagnosis not present

## 2021-04-05 DIAGNOSIS — R0602 Shortness of breath: Secondary | ICD-10-CM | POA: Diagnosis not present

## 2021-04-05 DIAGNOSIS — I509 Heart failure, unspecified: Secondary | ICD-10-CM | POA: Diagnosis not present

## 2021-04-05 DIAGNOSIS — I10 Essential (primary) hypertension: Secondary | ICD-10-CM | POA: Diagnosis not present

## 2021-04-05 DIAGNOSIS — E782 Mixed hyperlipidemia: Secondary | ICD-10-CM | POA: Diagnosis not present

## 2021-04-07 ENCOUNTER — Encounter: Payer: Self-pay | Admitting: Family Medicine

## 2021-04-07 NOTE — Telephone Encounter (Signed)
I'm not sure what this means- can we find out what she's saying ?

## 2021-04-08 NOTE — Telephone Encounter (Signed)
Called patient, states medication has already been refilled by another provider. No further questions.

## 2021-04-12 ENCOUNTER — Other Ambulatory Visit: Payer: Self-pay | Admitting: Family Medicine

## 2021-04-13 NOTE — Telephone Encounter (Signed)
Requested medications are due for refill today.  no  Requested medications are on the active medications list.  no  Last refill. 03/11/2021  Future visit scheduled.   yes  Notes to clinic.  Medication was discontinued 03/15/2021.    Requested Prescriptions  Pending Prescriptions Disp Refills   furosemide (LASIX) 20 MG tablet [Pharmacy Med Name: Furosemide 20 MG Oral Tablet] 10 tablet 0    Sig: TAKE 1 TABLET BY MOUTH ONCE DAILY FOR 3 DAYS     Cardiovascular:  Diuretics - Loop Failed - 04/12/2021 12:25 PM      Failed - Cl in normal range and within 180 days    Chloride  Date Value Ref Range Status  03/15/2021 94 (L) 96 - 106 mmol/L Final  02/11/2013 96 (L) 98 - 107 mmol/L Final          Failed - Mg Level in normal range and within 180 days    Magnesium  Date Value Ref Range Status  05/29/2020 1.8 1.6 - 2.3 mg/dL Final          Passed - K in normal range and within 180 days    Potassium  Date Value Ref Range Status  03/15/2021 3.8 3.5 - 5.2 mmol/L Final  02/11/2013 3.4 (L) 3.5 - 5.1 mmol/L Final          Passed - Ca in normal range and within 180 days    Calcium  Date Value Ref Range Status  03/15/2021 9.5 8.7 - 10.3 mg/dL Final   Calcium, Total  Date Value Ref Range Status  02/11/2013 9.2 8.5 - 10.1 mg/dL Final          Passed - Na in normal range and within 180 days    Sodium  Date Value Ref Range Status  03/15/2021 136 134 - 144 mmol/L Final  02/11/2013 126 (L) 136 - 145 mmol/L Final          Passed - Cr in normal range and within 180 days    Creatinine  Date Value Ref Range Status  02/11/2013 0.95 0.60 - 1.30 mg/dL Final   Creatinine, Ser  Date Value Ref Range Status  03/15/2021 0.92 0.57 - 1.00 mg/dL Final          Passed - Last BP in normal range    BP Readings from Last 1 Encounters:  03/15/21 132/62          Passed - Valid encounter within last 6 months    Recent Outpatient Visits           4 weeks ago Congestive heart failure,  unspecified HF chronicity, unspecified heart failure type ([place])   [place], Megan P, DO   1 month ago SOB (shortness of breath)   [employer], Megan P, DO   1 month ago Hyponatremia   [facility] Mc[place], Lauren A, NP   1 month ago COPD with acute exacerbation ([place])   [place], Jolene T, NP   1 month ago Acute cough   [facility] McElwee, Lauren A, NP       Future Appointments             In 1 month  [facility], [facility]   In 2 months [place], Megan P, DO [employer], [place]   In 5 months [place], [place], MD [place]

## 2021-04-26 ENCOUNTER — Encounter: Payer: Self-pay | Admitting: Family Medicine

## 2021-04-26 ENCOUNTER — Other Ambulatory Visit: Payer: Self-pay

## 2021-04-26 NOTE — Telephone Encounter (Signed)
Prescription request has been sent to PCP for approval

## 2021-04-26 NOTE — Telephone Encounter (Signed)
Patient messaging today for refill of her spirolactone 25.mg . Take 0.5 tab (12.5mg  daily)  Last seen on 03/15/21  Up coming visit 07/09/21

## 2021-04-28 ENCOUNTER — Emergency Department: Payer: Medicare Other

## 2021-04-28 ENCOUNTER — Encounter: Payer: Self-pay | Admitting: Emergency Medicine

## 2021-04-28 ENCOUNTER — Ambulatory Visit: Payer: Self-pay | Admitting: *Deleted

## 2021-04-28 ENCOUNTER — Other Ambulatory Visit: Payer: Self-pay

## 2021-04-28 ENCOUNTER — Observation Stay
Admission: EM | Admit: 2021-04-28 | Discharge: 2021-04-29 | Disposition: A | Discharge status: Home / Self Care | Payer: Medicare Other | Attending: Family Medicine | Admitting: Family Medicine

## 2021-04-28 DIAGNOSIS — R42 Dizziness and giddiness: Secondary | ICD-10-CM

## 2021-04-28 DIAGNOSIS — E871 Hypo-osmolality and hyponatremia: Secondary | ICD-10-CM | POA: Insufficient documentation

## 2021-04-28 DIAGNOSIS — Z20822 Contact with and (suspected) exposure to covid-19: Secondary | ICD-10-CM | POA: Diagnosis not present

## 2021-04-28 DIAGNOSIS — E440 Moderate protein-calorie malnutrition: Secondary | ICD-10-CM | POA: Insufficient documentation

## 2021-04-28 DIAGNOSIS — R55 Syncope and collapse: Principal | ICD-10-CM

## 2021-04-28 DIAGNOSIS — Z79899 Other long term (current) drug therapy: Secondary | ICD-10-CM | POA: Diagnosis not present

## 2021-04-28 DIAGNOSIS — F17210 Nicotine dependence, cigarettes, uncomplicated: Secondary | ICD-10-CM | POA: Diagnosis not present

## 2021-04-28 DIAGNOSIS — R634 Abnormal weight loss: Secondary | ICD-10-CM | POA: Diagnosis not present

## 2021-04-28 DIAGNOSIS — R002 Palpitations: Secondary | ICD-10-CM | POA: Insufficient documentation

## 2021-04-28 DIAGNOSIS — I129 Hypertensive chronic kidney disease with stage 1 through stage 4 chronic kidney disease, or unspecified chronic kidney disease: Secondary | ICD-10-CM | POA: Insufficient documentation

## 2021-04-28 DIAGNOSIS — J309 Allergic rhinitis, unspecified: Secondary | ICD-10-CM | POA: Diagnosis not present

## 2021-04-28 DIAGNOSIS — Z7982 Long term (current) use of aspirin: Secondary | ICD-10-CM | POA: Diagnosis not present

## 2021-04-28 DIAGNOSIS — J449 Chronic obstructive pulmonary disease, unspecified: Secondary | ICD-10-CM | POA: Diagnosis not present

## 2021-04-28 DIAGNOSIS — I10 Essential (primary) hypertension: Secondary | ICD-10-CM | POA: Diagnosis present

## 2021-04-28 DIAGNOSIS — D649 Anemia, unspecified: Secondary | ICD-10-CM | POA: Insufficient documentation

## 2021-04-28 DIAGNOSIS — Z7902 Long term (current) use of antithrombotics/antiplatelets: Secondary | ICD-10-CM | POA: Insufficient documentation

## 2021-04-28 DIAGNOSIS — Z955 Presence of coronary angioplasty implant and graft: Secondary | ICD-10-CM | POA: Diagnosis not present

## 2021-04-28 DIAGNOSIS — I700 Atherosclerosis of aorta: Secondary | ICD-10-CM | POA: Diagnosis present

## 2021-04-28 DIAGNOSIS — I2510 Atherosclerotic heart disease of native coronary artery without angina pectoris: Secondary | ICD-10-CM | POA: Diagnosis not present

## 2021-04-28 DIAGNOSIS — K219 Gastro-esophageal reflux disease without esophagitis: Secondary | ICD-10-CM | POA: Diagnosis present

## 2021-04-28 DIAGNOSIS — N182 Chronic kidney disease, stage 2 (mild): Secondary | ICD-10-CM | POA: Diagnosis not present

## 2021-04-28 DIAGNOSIS — F17200 Nicotine dependence, unspecified, uncomplicated: Secondary | ICD-10-CM | POA: Diagnosis present

## 2021-04-28 DIAGNOSIS — J439 Emphysema, unspecified: Secondary | ICD-10-CM | POA: Diagnosis not present

## 2021-04-28 DIAGNOSIS — Z716 Tobacco abuse counseling: Secondary | ICD-10-CM

## 2021-04-28 DIAGNOSIS — E785 Hyperlipidemia, unspecified: Secondary | ICD-10-CM | POA: Diagnosis present

## 2021-04-28 LAB — HEPATIC FUNCTION PANEL
ALT: 22 U/L (ref 0–44)
AST: 32 U/L (ref 15–41)
Albumin: 3.6 g/dL (ref 3.5–5.0)
Alkaline Phosphatase: 69 U/L (ref 38–126)
Bilirubin, Direct: <0.1 mg/dL (ref 0.0–0.2)
Total Bilirubin: 0.5 mg/dL (ref 0.3–1.2)
Total Protein: 6.9 g/dL (ref 6.5–8.1)

## 2021-04-28 LAB — URINALYSIS, COMPLETE (UACMP) WITH MICROSCOPIC
Bacteria, UA: NONE SEEN
Bilirubin Urine: NEGATIVE
Glucose, UA: NEGATIVE mg/dL
Hgb urine dipstick: NEGATIVE
Ketones, ur: NEGATIVE mg/dL
Leukocytes,Ua: NEGATIVE
Nitrite: NEGATIVE
Protein, ur: NEGATIVE mg/dL
Specific Gravity, Urine: 1.004 — ABNORMAL LOW (ref 1.005–1.030)
WBC, UA: NONE SEEN WBC/hpf (ref 0–5)
pH: 7 (ref 5.0–8.0)

## 2021-04-28 LAB — BASIC METABOLIC PANEL
Anion gap: 8 (ref 5–15)
BUN: 10 mg/dL (ref 8–23)
CO2: 24 mmol/L (ref 22–32)
Calcium: 9 mg/dL (ref 8.9–10.3)
Chloride: 97 mmol/L — ABNORMAL LOW (ref 98–111)
Creatinine, Ser: 0.77 mg/dL (ref 0.44–1.00)
GFR, Estimated: >60 mL/min (ref >60)
Glucose, Bld: 117 mg/dL — ABNORMAL HIGH (ref 70–99)
Potassium: 4 mmol/L (ref 3.5–5.1)
Sodium: 129 mmol/L — ABNORMAL LOW (ref 135–145)

## 2021-04-28 LAB — TROPONIN I (HIGH SENSITIVITY)
Troponin I (High Sensitivity): 5 ng/L (ref <18)
Troponin I (High Sensitivity): 8 ng/L (ref <18)

## 2021-04-28 LAB — CBC
HCT: 36.6 % (ref 36.0–46.0)
Hemoglobin: 11.9 g/dL — ABNORMAL LOW (ref 12.0–15.0)
MCH: 31.6 pg (ref 26.0–34.0)
MCHC: 32.5 g/dL (ref 30.0–36.0)
MCV: 97.3 fL (ref 80.0–100.0)
Platelets: 224 10*3/uL (ref 150–400)
RBC: 3.76 MIL/uL — ABNORMAL LOW (ref 3.87–5.11)
RDW: 14.6 % (ref 11.5–15.5)
WBC: 7.9 10*3/uL (ref 4.0–10.5)
nRBC: 0 % (ref 0.0–0.2)

## 2021-04-28 LAB — LACTIC ACID, PLASMA
Lactic Acid, Venous: 1 mmol/L (ref 0.5–1.9)
Lactic Acid, Venous: 0.6 mmol/L (ref 0.5–1.9)

## 2021-04-28 LAB — RESP PANEL BY RT-PCR (FLU A&B, COVID) ARPGX2
Influenza A by PCR: NEGATIVE
Influenza B by PCR: NEGATIVE
SARS Coronavirus 2 by RT PCR: NEGATIVE

## 2021-04-28 MED ORDER — ADULT MULTIVITAMIN W/MINERALS CH
1.0000 | ORAL_TABLET | Freq: Every day | ORAL | Status: DC
Start: 2021-04-29 — End: 2021-04-29
  Administered 2021-04-29: 1 via ORAL
  Filled 2021-04-28: qty 1

## 2021-04-28 MED ORDER — FLUTICASONE PROPIONATE 50 MCG/ACT NA SUSP
2.0000 | Freq: Every day | NASAL | Status: DC
  Administered 2021-04-29: 2 via NASAL
  Filled 2021-04-28: qty 16

## 2021-04-28 MED ORDER — ATORVASTATIN CALCIUM 20 MG PO TABS
40.0000 mg | ORAL_TABLET | Freq: Every day | ORAL | Status: DC
  Administered 2021-04-28 – 2021-04-29 (×2): 40 mg via ORAL
  Filled 2021-04-28 (×2): qty 2

## 2021-04-28 MED ORDER — SODIUM CHLORIDE 0.9 % IV BOLUS
500.0000 mL | Freq: Once | INTRAVENOUS | Status: AC
Start: 2021-04-28 — End: 2021-04-28
  Administered 2021-04-28: 500 mL via INTRAVENOUS

## 2021-04-28 MED ORDER — CLOPIDOGREL BISULFATE 75 MG PO TABS
75.0000 mg | ORAL_TABLET | Freq: Every day | ORAL | Status: DC
Start: 2021-04-29 — End: 2021-04-29
  Administered 2021-04-29: 75 mg via ORAL
  Filled 2021-04-28: qty 1

## 2021-04-28 MED ORDER — MOMETASONE FURO-FORMOTEROL FUM 200-5 MCG/ACT IN AERO
2.0000 | INHALATION_SPRAY | Freq: Two times a day (BID) | RESPIRATORY_TRACT | Status: DC
Start: 2021-04-28 — End: 2021-04-29
  Filled 2021-04-28 (×2): qty 8.8

## 2021-04-28 MED ORDER — PANTOPRAZOLE SODIUM 40 MG PO TBEC
40.0000 mg | DELAYED_RELEASE_TABLET | Freq: Every day | ORAL | Status: DC
  Administered 2021-04-29: 40 mg via ORAL
  Filled 2021-04-28: qty 1

## 2021-04-28 MED ORDER — ACETAMINOPHEN 650 MG RE SUPP: 650.0000 mg | Freq: Four times a day (QID) | RECTAL | Status: DC | PRN

## 2021-04-28 MED ORDER — ASPIRIN EC 81 MG PO TBEC
81.0000 mg | DELAYED_RELEASE_TABLET | Freq: Every day | ORAL | Status: DC
  Administered 2021-04-29: 81 mg via ORAL
  Filled 2021-04-28: qty 1

## 2021-04-28 MED ORDER — ISOSORBIDE MONONITRATE ER 60 MG PO TB24
60.0000 mg | ORAL_TABLET | Freq: Every day | ORAL | Status: DC
  Administered 2021-04-29: 60 mg via ORAL
  Filled 2021-04-28: qty 1

## 2021-04-28 MED ORDER — TIOTROPIUM BROMIDE MONOHYDRATE 18 MCG IN CAPS
18.0000 ug | ORAL_CAPSULE | Freq: Every day | RESPIRATORY_TRACT | Status: DC
  Administered 2021-04-29: 18 ug via RESPIRATORY_TRACT
  Filled 2021-04-28: qty 5

## 2021-04-28 MED ORDER — NICOTINE 21 MG/24HR TD PT24
21.0000 mg | MEDICATED_PATCH | Freq: Every day | TRANSDERMAL | Status: DC | PRN
  Filled 2021-04-28: qty 1

## 2021-04-28 MED ORDER — ACETAMINOPHEN 325 MG PO TABS: 650.0000 mg | ORAL_TABLET | Freq: Four times a day (QID) | ORAL | Status: DC | PRN

## 2021-04-28 MED ORDER — MECLIZINE HCL 25 MG PO TABS
25.0000 mg | ORAL_TABLET | Freq: Once | ORAL | Status: AC
  Administered 2021-04-28: 25 mg via ORAL
  Filled 2021-04-28: qty 1

## 2021-04-28 MED ORDER — DICLOFENAC SODIUM 1 % EX GEL
4.0000 g | Freq: Four times a day (QID) | CUTANEOUS | Status: DC | PRN
  Filled 2021-04-28: qty 100

## 2021-04-28 MED ORDER — METOPROLOL SUCCINATE ER 50 MG PO TB24
50.0000 mg | ORAL_TABLET | Freq: Every day | ORAL | Status: DC
Start: 2021-04-29 — End: 2021-04-29
  Administered 2021-04-29: 50 mg via ORAL
  Filled 2021-04-28: qty 1

## 2021-04-28 MED ORDER — NYSTATIN 100000 UNIT/ML MT SUSP
5.0000 mL | Freq: Four times a day (QID) | OROMUCOSAL | Status: DC
  Administered 2021-04-29: 500000 [IU] via ORAL
  Filled 2021-04-28: qty 5

## 2021-04-28 MED ORDER — LOSARTAN POTASSIUM 50 MG PO TABS
100.0000 mg | ORAL_TABLET | Freq: Every day | ORAL | Status: DC
  Administered 2021-04-29: 100 mg via ORAL
  Filled 2021-04-28: qty 2

## 2021-04-28 MED ORDER — ALBUTEROL SULFATE (2.5 MG/3ML) 0.083% IN NEBU: 3.0000 mL | INHALATION_SOLUTION | Freq: Four times a day (QID) | RESPIRATORY_TRACT | Status: DC | PRN

## 2021-04-28 MED ORDER — ONDANSETRON HCL 4 MG/2ML IJ SOLN: 4.0000 mg | Freq: Four times a day (QID) | INTRAMUSCULAR | Status: DC | PRN

## 2021-04-28 MED ORDER — HYDRALAZINE HCL 25 MG PO TABS
25.0000 mg | ORAL_TABLET | Freq: Two times a day (BID) | ORAL | Status: DC
  Administered 2021-04-29: 25 mg via ORAL
  Filled 2021-04-28: qty 1

## 2021-04-28 MED ORDER — SPIRONOLACTONE 25 MG PO TABS
12.5000 mg | ORAL_TABLET | Freq: Every day | ORAL | Status: DC
  Administered 2021-04-29: 12.5 mg via ORAL
  Filled 2021-04-28: qty 0.5
  Filled 2021-04-28: qty 1

## 2021-04-28 MED ORDER — ENOXAPARIN SODIUM 40 MG/0.4ML IJ SOSY
40.0000 mg | PREFILLED_SYRINGE | INTRAMUSCULAR | Status: DC
  Administered 2021-04-28: 40 mg via SUBCUTANEOUS
  Filled 2021-04-28: qty 0.4

## 2021-04-28 MED ORDER — POLYETHYLENE GLYCOL 3350 17 GM/SCOOP PO POWD
17.0000 g | Freq: Every day | ORAL | Status: DC | PRN
  Filled 2021-04-28: qty 255

## 2021-04-28 MED ORDER — ONDANSETRON HCL 4 MG PO TABS: 4.0000 mg | ORAL_TABLET | Freq: Four times a day (QID) | ORAL | Status: DC | PRN

## 2021-04-28 NOTE — Assessment & Plan Note (Signed)
-   I discussed extensively with patient that she will need close follow-up with her PCP and ensure that her mammograms and colonoscopy are up-to-date - Recommended that patient have outpatient lung cancers screening via CT scan as appropriate given her history of tobacco dependence - Patient endorses understanding and compliance

## 2021-04-28 NOTE — Assessment & Plan Note (Signed)
-   Nicotine patch as needed for nicotine craving ordered ?

## 2021-04-28 NOTE — Assessment & Plan Note (Addendum)
-   Patient takes aspirin and Plavix daily

## 2021-04-28 NOTE — Hospital Course (Signed)
[name] is a 71 year old female with history of hyperlipidemia, hypertension, mild anemia, GERD, hyponatremia, nicotine dependence, COPD severe, who presented to the emergency department for chief concerns of presyncopal and palpitation.  Initial vitals in the emergency department showed temperature of 97.9, respiration rate of 20, heart rate of 90, blood pressure 168/82, SPO2 of 95% on room air.  Serum sodium 129, potassium 4.0, chloride 97, bicarb 24, BUN of 10, serum creatinine of 0.77, nonfasting blood glucose 117, GFR greater than 60.  WBC 7.9, hemoglobin 11.9, platelets of 224.  High sensitivity troponin was initially 5 and on repeat was 8.  Lactic acid was 1.0.  UA was negative for leukocytes and nitrites.  In the emergency department CT of the head without contrast was ordered and read as no evidence of acute intracranial abnormality.  Portable chest x-ray was read as cardiopulmonary changes compatible with COPD.  No acute superimposed process.  ED treatment: Meclizine 25 mg p.o., sodium chloride 500 mL bolus.

## 2021-04-28 NOTE — Telephone Encounter (Signed)
Pt states she is currently at ED

## 2021-04-28 NOTE — Assessment & Plan Note (Signed)
-   Patient takes hydralazine 25 mg p.o. twice daily, isosorbide mononitrate 60 mg daily, losartan 100 mg daily, metoprolol succinate 50 mg daily, spironolactone 25 mg daily

## 2021-04-28 NOTE — Telephone Encounter (Signed)
° ° ° °  Chief Complaint: Severe dizziness Symptoms: Spinning dizziness,heart "Fluttering" mild headache, chest tightness. Worse sitting to standing BP 139/63 Frequency: Onset this AM, "All day" spinning Pertinent Negatives: Patient denies  Disposition: [x] ED /[] Urgent Care (no appt availability in office) / [] Appointment(In office/virtual)/ []  [facility]/ [] Home Care/ [] Refused Recommended Disposition /[] [place] [facility]/ []  Follow-up with PCP Additional Notes: states son will drive to ED  Reason for Disposition  [1] Dizziness (vertigo) present now AND [2] one or more STROKE RISK FACTORS (i.e., hypertension, diabetes, prior stroke/TIA, heart attack)  (Exception: prior physician evaluation for this AND no different/worse than usual)    Heart "Fluttering"  Answer Assessment - Initial Assessment Questions 1. DESCRIPTION: "Describe your dizziness."     spinning 2. VERTIGO: "Do you feel like either you or the room is spinning or tilting?"      yes 3. LIGHTHEADED: "Do you feel lightheaded?" (e.g., somewhat faint, woozy, weak upon standing)     yes 4. SEVERITY: "How bad is it?"  "Can you walk?"   - MILD: Feels slightly dizzy and unsteady, but is walking normally.   - MODERATE: Feels unsteady when walking, but not falling; interferes with normal activities (e.g., school, work).   - SEVERE: Unable to walk without falling, or requires assistance to walk without falling.     Moderate to severe 5. ONSET:  "When did the dizziness begin?"     This AM 6. AGGRAVATING FACTORS: "Does anything make it worse?" (e.g., standing, change in head position)     Going from sitting to standing 7. CAUSE: "What do you think is causing the dizziness?"     Unsure 8. RECURRENT SYMPTOM: "Have you had dizziness before?" If Yes, ask: "When was the last time?" "What happened that time?"     Yes "Sinus trouble" not presently 9. OTHER SYMPTOMS: "Do you have any other symptoms?" (e.g., headache, weakness,  numbness, vomiting, earache)     Cough, mild headache, chest tightness, fluttering in chest.  Protocols used: Dizziness - Vertigo-A-AH

## 2021-04-28 NOTE — Telephone Encounter (Signed)
Left a message per providers that patient would need to go to the ER to be seen for her current symptoms. Advised patient to give our office a call back if she has any questions.

## 2021-04-28 NOTE — Assessment & Plan Note (Addendum)
-   Greater than 3 minutes spent counseling patient on tobacco cessation Patient endorses readiness to stop tobacco use.  Tobacco cessation counseling:  1. Week one and two, smoke 19 cigarettes per day. Week three and four, smoke 18 cigarettes per day. Week five and six, smoke 17 cigarettes per day 2. Only smoke outside and wear outer covering 3. Leave cigarettes and lighters outside in separate places 4. Continued use of tobacco smoking will worsen patient's COPD and put patient at risk for cancer 5. Smoking endorse increases secondhand smoking, given that patient has a 81-year-old great granddaughter living with her 6. Patient endorses understanding and compliance

## 2021-04-28 NOTE — ED Provider Notes (Signed)
[facility] Provider Note    Event Date/Time   First MD Initiated Contact with Patient 04/28/21 1729     (approximate)   History   Palpitations and Dizziness   HPI  Kristi Clark is a 71 y.o. female with history of CKD, hyperlipidemia, hypertension, CAD with with cardiac stents, presents emergency department complaining of feeling like her heart is fluttering, feels dizzy, some abdominal pain, difficulty urinating, and having chills.  She denies chest pain/shortness of breath.      Physical Exam   Triage Vital Signs: ED Triage Vitals  Enc Vitals Group     BP 04/28/21 1553 (!) 168/82     Pulse Rate 04/28/21 1553 90     Resp 04/28/21 1553 20     Temp 04/28/21 1553 97.9 F (36.6 C)     Temp Source 04/28/21 1553 Oral     SpO2 04/28/21 1553 95 %     Weight 04/28/21 1552 113 lb 1.5 oz (51.3 kg)     Height 04/28/21 1552 4' 10.47" (1.485 m)     Head Circumference --      Peak Flow --      Pain Score 04/28/21 1552 0     Pain Loc --      Pain Edu? --      Excl. in [place]? --     Most recent vital signs: Vitals:   04/28/21 1553 04/28/21 1853  BP: (!) 168/82 (!) 129/47  Pulse: 90 79  Resp: 20 20  Temp: 97.9 F (36.6 C)   SpO2: 95% 100%     General: Awake, no distress.   CV:  Good peripheral perfusion. regular rate and  rhythm Resp:  Normal effort. Lungs CTA Abd:  No distention.  Minimally tender Other:  PERRL, EOMI, nystagmus noted bilaterally   ED Results / Procedures / Treatments   Labs (all labs ordered are listed, but only abnormal results are displayed) Labs Reviewed  BASIC METABOLIC PANEL - Abnormal; Notable for the following components:      Result Value   Sodium 129 (*)    Chloride 97 (*)    Glucose, Bld 117 (*)    All other components within normal limits  CBC - Abnormal; Notable for the following components:   RBC 3.76 (*)    Hemoglobin 11.9 (*)    All other components within normal limits  URINALYSIS, COMPLETE  (UACMP) WITH MICROSCOPIC - Abnormal; Notable for the following components:   Color, Urine YELLOW (*)    APPearance CLEAR (*)    Specific Gravity, Urine 1.004 (*)    All other components within normal limits  RESP PANEL BY RT-PCR (FLU A&B, COVID) ARPGX2  LACTIC ACID, PLASMA  HEPATIC FUNCTION PANEL  LACTIC ACID, PLASMA  TROPONIN I (HIGH SENSITIVITY)  TROPONIN I (HIGH SENSITIVITY)     EKG  EKG   RADIOLOGY CT of the head    PROCEDURES:   Procedures   MEDICATIONS ORDERED IN ED: Medications  meclizine (ANTIVERT) tablet 25 mg (25 mg Oral Given 04/28/21 1752)  sodium chloride 0.9 % bolus 500 mL (500 mLs Intravenous New Bag/Given 04/28/21 1851)     IMPRESSION / MDM / ASSESSMENT AND PLAN / ED COURSE  I reviewed the triage vital signs and the nursing notes.                              Differential diagnosis includes, but is not  limited to, SAH, subdural, CVA, electrolyte imbalance, UTI, sepsis  CBC is normal indicating patient may not be septic, basic metabolic panel shows a low sodium of 129, glucose of 117, first troponin is normal  Did order CT of the head, UA, added hepatic panel, and I am awaiting the second troponin.  Patient appears to be stable at this time.  Chest x-ray was independently reviewed by me and do not see any acute abnormality, radiologist reads as chronic COPD changes.  EKG shows normal sinus rhythm, see physician read  UA is normal  Informed Dr. Ellender Hose of the results, she is not septic labs are looking appropriate other than the sodium is a little depressed.  She was given 500 mils normal saline to correct her sodium level.  Will reassess dizziness.  Care transferred to Dr. Ellender Hose.        FINAL CLINICAL IMPRESSION(S) / ED DIAGNOSES   Final diagnoses:  Dizziness     Rx / DC Orders   ED Discharge Orders     None        Note:  This document was prepared using Dragon voice recognition software and may include unintentional dictation  errors.    Versie Starks, PA-C 04/28/21 Kristi Giovanni, MD 05/01/21 [phone]

## 2021-04-28 NOTE — Assessment & Plan Note (Signed)
-   Atorvastatin 40 mg nightly resumed 

## 2021-04-28 NOTE — Assessment & Plan Note (Addendum)
-   Etiology work-up in progress at this time - Complete echo ordered, orthostatic vitals ordered - Would recommend patient would benefit from outpatient Holter/continuous cardiac monitoring with follow-up to cardiology - Admit to telemetry cardiac, observation

## 2021-04-28 NOTE — ED Triage Notes (Signed)
C/O heart fluttering and dizziness today.  AAOx3  skin warm and dry. NAD

## 2021-04-28 NOTE — Assessment & Plan Note (Signed)
-   PPI resumed °

## 2021-04-28 NOTE — Assessment & Plan Note (Signed)
-   Resumed home maintenance inhaler

## 2021-04-28 NOTE — ED Notes (Signed)
See triage note for details, pt to ED for dizziness that started this am, states she feels like she could fall. Denies any LOC. Pt also has had chest tightness for the past couple of weeks. Denies SOB. Denies any current CP. Pt has hx of "leaky heart valve", and COPD. Pt is A&Ox4

## 2021-04-28 NOTE — H&P (Signed)
History and Physical   Kristi Clark:[id number] DOB: 27-Aug-1950 DOA: 04/28/2021  PCP: [name], DO  Outpatient Specialists: Dr. Laurence Ferrari, dermatology Patient coming from: Home  I have personally briefly reviewed patient's old medical records in [place].  Chief Concern: Presyncope/palpitations  HPI: Kristi Clark is a 71 year old female with history of hyperlipidemia, hypertension, mild anemia, GERD, hyponatremia, nicotine dependence, COPD severe, who presented to the emergency department for chief concerns of presyncopal and palpitation.  Initial vitals in the emergency department showed temperature of 97.9, respiration rate of 20, heart rate of 90, blood pressure 168/82, SPO2 of 95% on room air.  Serum sodium 129, potassium 4.0, chloride 97, bicarb 24, BUN of 10, serum creatinine of 0.77, nonfasting blood glucose 117, GFR greater than 60.  WBC 7.9, hemoglobin 11.9, platelets of 224.  High sensitivity troponin was initially 5 and on repeat was 8.  Lactic acid was 1.0.  UA was negative for leukocytes and nitrites.  In the emergency department CT of the head without contrast was ordered and read as no evidence of acute intracranial abnormality.  Portable chest x-ray was read as cardiopulmonary changes compatible with COPD.  No acute superimposed process.  ED treatment: Meclizine 25 mg p.o., sodium chloride 500 mL bolus.  At bedside patient is able to tell me her name, her age, she knows she is in the hospital.  She reports that upon waking up at 7 AM on day of admission, she felt dizziness like the room was spinning.  At this time she has not had her antihypertensive medications yet.  She had lunch and then she took her a.m. antihypertensive medications around noon.  She then tried to lay down thinking that if she got more sleep she would feel better.  She developed fluttering/palpitation in her chest when she tried to lay down.  She reports this is never  happened before.  She denies any recent traveling including getting on a cruise or being on a boat.  She denies falling or hitting her head.  She denies any trauma or anything or anyone hitting her.  She denies any fever.  She endorses unintentional weight changes of at least 13 pounds over the last several months.  She is unclear as to how long it has been but it seems like every time she weighs herself, she is losing weight.  She reports that the last time she weighed herself she was about 120 pounds and when nursing weighed her she was 103 pounds.  She denies intentionally trying to lose weight.  Social history: She currently smokes 2 packs a day and started at the age of 1.  ROS: Constitutional: + weight change, no fever ENT/Mouth: no sore throat, no rhinorrhea Eyes: no eye pain, no vision changes Cardiovascular: no chest pain, no dyspnea,  no edema, + palpitations Respiratory: no cough, no sputum, no wheezing Gastrointestinal: no nausea, no vomiting, no diarrhea, no constipation Genitourinary: no urinary incontinence, no dysuria, no hematuria Musculoskeletal: no arthralgias, no myalgias Skin: no skin lesions, no pruritus, Neuro: + weakness, no loss of consciousness, no syncope Psych: no anxiety, no depression, + decrease appetite Heme/Lymph: no bruising, no bleeding  ED Course: Discussed with emergency medicine provider, patient requiring hospitalization for chief concerns of presyncopal/palpitation.  Assessment/Plan  Principal Problem:   Pre-syncope Active Problems:   Anemia   Allergic rhinitis   CKD (chronic kidney disease), stage II   COPD, severe (HCC)   GERD (gastroesophageal reflux disease)  Hyperlipidemia   Benign hypertension with chronic kidney disease   CAD (coronary artery disease)   Hyponatremia   Nicotine dependence   Aortic atherosclerosis (HCC)   Unintentional weight loss   Encounter for tobacco use cessation counseling   Cardiovascular and  Mediastinum * Pre-syncope Assessment & Plan - Etiology work-up in progress at this time - Complete echo ordered, orthostatic vitals ordered - Would recommend patient would benefit from outpatient Holter/continuous cardiac monitoring with follow-up to cardiology - Admit to telemetry cardiac, observation  CAD (coronary artery disease) Assessment & Plan - Patient takes aspirin and Plavix daily, these have been resumed for 04/29/2021  Benign hypertension with chronic kidney disease Assessment & Plan - Patient takes hydralazine 25 mg p.o. twice daily, isosorbide mononitrate 60 mg daily, losartan 100 mg daily, metoprolol succinate 50 mg daily, spironolactone 25 mg daily  Respiratory COPD, severe (HCC) Assessment & Plan - Resumed home maintenance inhaler  Digestive GERD (gastroesophageal reflux disease) Assessment & Plan - PPI resumed  Genitourinary CKD (chronic kidney disease), stage II Assessment & Plan - At baseline  Other Encounter for tobacco use cessation counseling Assessment & Plan - Greater than 3 minutes spent counseling patient on tobacco cessation Patient endorses readiness to stop tobacco use.  Tobacco cessation counseling:  Week one and two, smoke 19 cigarettes per day. Week three and four, smoke 18 cigarettes per day. Week five and six, smoke 17 cigarettes per day Only smoke outside and wear outer covering Leave cigarettes and lighters outside in separate places Continued use of tobacco smoking will worsen patient's COPD and put patient at risk for cancer Smoking endorse increases secondhand smoking, given that patient has a 81-year-old great granddaughter living with her Patient endorses understanding and compliance  Unintentional weight loss Assessment & Plan - I discussed extensively with patient that she will need close follow-up with her PCP and ensure that her mammograms and colonoscopy are up-to-date - Recommended that patient have outpatient lung cancers  screening via CT scan as appropriate given her history of tobacco dependence - Patient endorses understanding and compliance  Nicotine dependence Assessment & Plan - Nicotine patch as needed for nicotine craving ordered  Hyperlipidemia Assessment & Plan - Atorvastatin 40 mg nightly resumed  Pending med reconciliation  Chart reviewed.   DVT prophylaxis: Enoxaparin Code Status: DNR/DNI Diet: Heart healthy Family Communication: No Disposition Plan: Pending clinical course Consults called: None at this time Admission status: Telemetry cardiac, observation  Past Medical History:  Diagnosis Date   AK (actinic keratosis) 10/25/2017   left forehead   BCC (basal cell carcinoma) 10/25/2017   left upper lateral eyelid, Moh's   COPD (chronic obstructive pulmonary disease) (HCC)    Depression    GERD (gastroesophageal reflux disease)    Hyperlipidemia    Hypertension    Menopause    Osteopenia    Tobacco abuse    Past Surgical History:  Procedure Laterality Date   CARDIAC CATHETERIZATION     CHOLECYSTECTOMY  2014   COLONOSCOPY WITH PROPOFOL N/A 01/16/2018   Procedure: COLONOSCOPY WITH PROPOFOL;  Surgeon: Lollie Sails, MD;  Location[facility] ENDOSCOPY;  Service: Endoscopy;  Laterality: N/A;   ESOPHAGOGASTRODUODENOSCOPY (EGD) WITH PROPOFOL N/A 01/16/2018   Procedure: ESOPHAGOGASTRODUODENOSCOPY (EGD) WITH PROPOFOL;  Surgeon: Lollie Sails, MD;  Location: [facility] ENDOSCOPY;  Service: Endoscopy;  Laterality: N/A;   heart stent     TOTAL ABDOMINAL HYSTERECTOMY     Social History:  reports that she has been smoking cigarettes. She has a 112.00 pack-year  smoking history. She has never used smokeless tobacco. She reports that she does not drink alcohol and does not use drugs.  Allergies  Allergen Reactions   Clindamycin/Lincomycin Hives   Prednisone Other (See Comments)    Psychosis   Amoxicillin Rash   Avelox [Moxifloxacin Hcl In Nacl] Rash   Codeine Sulfate Nausea Only    Penicillins Rash   Sulfa Antibiotics Rash   Family History  Problem Relation Age of Onset   Cancer Mother    Stroke Mother    Heart disease Father    Hyperlipidemia Father    Cancer Sister    Diabetes Sister    Breast cancer Sister 78   Diabetes Brother    Asthma Son    Cancer Son    Diabetes Daughter    Hypertension Daughter    Cancer Maternal Grandmother        gallbladder   Diabetes Brother    Heart disease Brother    Breast cancer Paternal Aunt    Family history: Family history reviewed and not pertinent.  Prior to Admission medications   Medication Sig Start Date End Date Taking? Authorizing Provider  albuterol (VENTOLIN HFA) 108 (90 Base) MCG/ACT inhaler Inhale 2 puffs into the lungs every 6 (six) hours as needed for wheezing or shortness of breath. 03/09/21   Park Liter P, DO  aspirin 81 MG tablet Take 81 mg by mouth daily.    [provider]  atorvastatin (LIPITOR) 40 MG tablet Take 1 tablet (40 mg total) by mouth daily. 03/09/21   Park Liter P, DO  Blood Pressure Monitoring (BLOOD PRESSURE MONITOR/WRIST) KIT Take Blood pressure as needed, Dx: I12.9 12/09/20   Park Liter P, DO  Calcium Carbonate (CALCIUM 600 PO) Take by mouth daily.    [provider]  Cetirizine HCl (ZYRTEC PO) Take by mouth daily.    [provider]  clopidogrel (PLAVIX) 75 MG tablet Take 1 tablet (75 mg total) by mouth daily. 01/06/21   Park Liter P, DO  diclofenac Sodium (VOLTAREN) 1 % GEL Apply 4 g topically 4 (four) times daily. 08/31/20   Johnson, Megan P, DO  fluticasone (FLONASE) 50 MCG/ACT nasal spray Place 2 sprays into both nostrils daily. 07/31/20   Johnson, Megan P, DO  fluticasone-salmeterol (ADVAIR DISKUS) 250-50 MCG/ACT AEPB Inhale 1 puff into the lungs in the morning and at bedtime. 08/31/20   Park Liter P, DO  hydrALAZINE (APRESOLINE) 25 MG tablet Take 1 tablet (25 mg total) by mouth 2 (two) times daily. 03/09/21   Johnson, Megan P, DO  isosorbide  mononitrate (IMDUR) 60 MG 24 hr tablet Take 1 tablet (60 mg total) by mouth daily. 03/09/21   Johnson, Megan P, DO  losartan (COZAAR) 100 MG tablet Take 1 tablet (100 mg total) by mouth daily. 03/09/21   Johnson, Megan P, DO  metoprolol succinate (TOPROL-XL) 50 MG 24 hr tablet Take 1 tablet (50 mg total) by mouth daily. Take with or immediately following a meal. 01/06/21   Johnson, Megan P, DO  Multiple Vitamins-Minerals (CENTRUM SILVER ADULT 50+ PO) Take 1 tablet by mouth daily.    [provider]  nystatin (MYCOSTATIN) 100000 UNIT/ML suspension Take 5 mLs (500,000 Units total) by mouth 4 (four) times daily. 03/02/21   McElwee, Lauren A, NP  pantoprazole (PROTONIX) 40 MG tablet TAKE ONE TABLET BY MOUTH EVERY DAY 07/31/20   Johnson, Megan P, DO  polyethylene glycol powder (GLYCOLAX/MIRALAX) 17 GM/SCOOP powder Take 17 g by mouth daily as needed  for moderate constipation. 12/09/20   Johnson, Megan P, DO  Probiotic Product (PROBIOTIC-10 PO) Take by mouth. Every other day    [provider]  spironolactone (ALDACTONE) 25 MG tablet Take 12.5 mg by mouth daily. 03/19/21   [provider]  tiotropium (SPIRIVA) 18 MCG inhalation capsule Place 1 capsule (18 mcg total) into inhaler and inhale daily. 03/29/21   Jon Billings, NP  tizanidine (ZANAFLEX) 2 MG capsule Take 1 capsule (2 mg total) by mouth 3 (three) times daily as needed for muscle spasms. 02/24/21   Cannady, Henrine Screws T, NP  triamcinolone cream (KENALOG) 0.1 % Apply 1 application topically 2 (two) times daily. 03/02/21   Charyl Dancer, NP   Physical Exam: Vitals:   04/28/21 1552 04/28/21 1553 04/28/21 1853 04/28/21 2052  BP:  (!) 168/82 (!) 129/47 (!) 152/54  Pulse:  90 79 70  Resp:  _0 Temp:  97.9 F (36.6 C)  98.2 F (36.8 C)  TempSrc:  Oral  Oral  SpO2:  95% 100% 96%  Weight: 51.3 kg   47 kg  Height: 4' 10.47" (1.485 m)      Constitutional: appears age-appropriate, frail, cachectic, NAD, calm,  comfortable Eyes: PERRL, lids and conjunctivae normal ENMT: Mucous membranes are moist. Posterior pharynx clear of any exudate or lesions. Age-appropriate dentition. Hearing appropriate Neck: normal, supple, no masses, no thyromegaly Respiratory: clear to auscultation bilaterally, no wheezing, no crackles. Normal respiratory effort. No accessory muscle use.  Increased AP diameter of the chest. Cardiovascular: Regular rate and rhythm, no murmurs / rubs / gallops. No extremity edema. 2+ pedal pulses. No carotid bruits.  Abdomen: no tenderness, no masses palpated, no hepatosplenomegaly. Bowel sounds positive.  Musculoskeletal: no clubbing / cyanosis. No joint deformity upper and lower extremities. Good ROM, no contractures, no atrophy. Normal muscle tone.  Skin: no rashes, lesions, ulcers. No induration Neurologic: Sensation intact. Strength 5/5 in all 4.  Psychiatric: Normal judgment and insight. Alert and oriented x 3. Normal mood.   EKG: independently reviewed, showing sinus rhythm with rate of 86, QTc 402  Chest x-ray on Admission: I personally reviewed and I agree with radiologist reading as below.  DG Chest 2 View  Result Date: 04/28/2021 CLINICAL DATA:  Patient with dizziness. EXAM: CHEST - 2 VIEW COMPARISON:  Chest radiograph 02/24/2021. FINDINGS: Stable cardiac and mediastinal contours. Aortic atherosclerosis. Emphysematous change. Similar-appearing scarring within the lung apices bilaterally. No large area pulmonary consolidation. No pleural effusion or pneumothorax. Thoracic spine degenerative changes. IMPRESSION: Cardiopulmonary changes compatible with COPD. No acute superimposed process. Electronically Signed   By: Lovey Newcomer M.D.   On: 04/28/2021 16:22   CT HEAD WO CONTRAST (5MM)  Result Date: 04/28/2021 CLINICAL DATA:  Dizziness, persistent/recurrent, cardiac or vascular cause suspected EXAM: CT HEAD WITHOUT CONTRAST TECHNIQUE: Contiguous axial images were obtained from the base of  the skull through the vertex without intravenous contrast. RADIATION DOSE REDUCTION: This exam was performed according to the departmental dose-optimization program which includes automated exposure control, adjustment of the mA and/or kV according to patient size and/or use of iterative reconstruction technique. COMPARISON:  CT head 03/02/2020. FINDINGS: Brain: No evidence of acute infarction, hemorrhage, hydrocephalus, extra-axial collection or mass lesion/mass effect. Mild patchy white matter hypoattenuation, nonspecific but compatible with chronic microvascular ischemic disease. Vascular: No hyperdense vessel identified. Skull: No acute fracture. Sinuses/Orbits: Visualized sinuses are clear.  Unremarkable orbits. Other: No mastoid effusions. IMPRESSION: No evidence of acute intracranial abnormality. Electronically Signed   By: Albertina Parr  Adah Salvage M.D.   On: 04/28/2021 18:04    Labs on Admission: I have personally reviewed following labs  CBC: Recent Labs  Lab 04/28/21 1554  WBC 7.9  HGB 11.9*  HCT 36.6  MCV 97.3  PLT 557   Basic Metabolic Panel: Recent Labs  Lab 04/28/21 1554  NA 129*  K 4.0  CL 97*  CO2 24  GLUCOSE 117*  BUN 10  CREATININE 0.77  CALCIUM 9.0   GFR: Estimated Creatinine Clearance: 42.1 mL/min (by C-G formula based on SCr of 0.77 mg/dL).  Liver Function Tests: Recent Labs  Lab 04/28/21 1838  AST 32  ALT 22  ALKPHOS 69  BILITOT 0.5  PROT 6.9  ALBUMIN 3.6   Urine analysis:    Component Value Date/Time   COLORURINE YELLOW (A) 04/28/2021 1838   APPEARANCEUR CLEAR (A) 04/28/2021 1838   APPEARANCEUR Clear 11/18/2019 1347   LABSPEC 1.004 (L) 04/28/2021 1838   PHURINE 7.0 04/28/2021 1838   GLUCOSEU NEGATIVE 04/28/2021 1838   HGBUR NEGATIVE 04/28/2021 1838   BILIRUBINUR NEGATIVE 04/28/2021 1838   BILIRUBINUR Negative 11/18/2019 [place] 04/28/2021 1838   PROTEINUR NEGATIVE 04/28/2021 1838   NITRITE NEGATIVE 04/28/2021 1838    LEUKOCYTESUR NEGATIVE 04/28/2021 1838   Dr. Tobie Poet [facility]  If 7PM-7AM, please contact overnight-coverage provider If 7AM-7PM, please contact day coverage provider [email or web address]  04/28/2021, 10:21 PM

## 2021-04-28 NOTE — Assessment & Plan Note (Signed)
At baseline 

## 2021-04-28 NOTE — ED Notes (Signed)
Pt transported to bathroom to obtain urine sample, pt unable to void at this time. Pt states she has also been c/o burning while urinating.

## 2021-04-29 ENCOUNTER — Observation Stay
Admit: 2021-04-29 | Discharge: 2021-04-29 | Disposition: A | Discharge status: Home / Self Care | Payer: Medicare Other | Attending: Internal Medicine | Admitting: Internal Medicine

## 2021-04-29 DIAGNOSIS — E440 Moderate protein-calorie malnutrition: Secondary | ICD-10-CM | POA: Insufficient documentation

## 2021-04-29 DIAGNOSIS — R55 Syncope and collapse: Secondary | ICD-10-CM | POA: Diagnosis not present

## 2021-04-29 LAB — CBC
HCT: 31.8 % — ABNORMAL LOW (ref 36.0–46.0)
Hemoglobin: 10.8 g/dL — ABNORMAL LOW (ref 12.0–15.0)
MCH: 32 pg (ref 26.0–34.0)
MCHC: 34 g/dL (ref 30.0–36.0)
MCV: 94.4 fL (ref 80.0–100.0)
Platelets: 204 10*3/uL (ref 150–400)
RBC: 3.37 MIL/uL — ABNORMAL LOW (ref 3.87–5.11)
RDW: 14.6 % (ref 11.5–15.5)
WBC: 5.8 10*3/uL (ref 4.0–10.5)
nRBC: 0 % (ref 0.0–0.2)

## 2021-04-29 LAB — BASIC METABOLIC PANEL
Anion gap: 7 (ref 5–15)
BUN: 10 mg/dL (ref 8–23)
CO2: 26 mmol/L (ref 22–32)
Calcium: 9.1 mg/dL (ref 8.9–10.3)
Chloride: 101 mmol/L (ref 98–111)
Creatinine, Ser: 0.68 mg/dL (ref 0.44–1.00)
GFR, Estimated: >60 mL/min (ref >60)
Glucose, Bld: 91 mg/dL (ref 70–99)
Potassium: 3.9 mmol/L (ref 3.5–5.1)
Sodium: 134 mmol/L — ABNORMAL LOW (ref 135–145)

## 2021-04-29 LAB — ECHOCARDIOGRAM COMPLETE
Area-P 1/2: 4.21 cm2
Height: 59 in
S' Lateral: 2.6 cm
Weight: 1659.2 oz

## 2021-04-29 NOTE — Progress Notes (Addendum)
Discharge instructions explained to pt / verbalized an understanding/ iv and tele removed/ home meds returned to pt /will transport off unit via wheelchair.

## 2021-04-29 NOTE — Hospital Course (Signed)
This 71 years old female with PMH significant for hyperlipidemia, hypertension, mild anemia, GERD, hyponatremia, nicotine dependence, severe COPD, presented in the ED with complaints of presyncopal episodes and feeling lightheaded.  Troponins negative.  CT head unremarkable.  Chest x-ray findings consistent with COPD.  Patient was given IV hydration and patient was given meclizine.  Patient was admitted for lightheadedness.  Echocardiogram completed,  report pending.  Patient denies any chest pain, shortness of breath, dizziness or palpitation.  Patient participated with physical therapy, orthostatic check which remains normal.  Discussed with cardiology Dr. Laurelyn Sickle PA states Patient can be discharged, patient does have appointment with Dr. Humphrey Rolls on Monday.  Patient being discharged home.

## 2021-04-29 NOTE — Progress Notes (Signed)
Mobility Specialist - Progress Note    04/29/21 1221  Mobility  Activity Ambulated with assistance in hallway;Stood at bedside;Dangled on edge of bed  Level of Assistance Standby assist, set-up cues, supervision of patient - no hands on  Assistive Device None  Distance Ambulated (ft) 300 ft  Activity Response Tolerated well  $Mobility charge 1 Mobility    Orthostatics: Lying 98% SpO2 79HR 136/47 BP Sitting: 95% SpO2 78HR 159/60 BP Standing: 95% 82HR 135/55 BP  Pt supine utilizing RA upon arrival. Pt orthostatics above. Pt completed sat EOB and STS with supervision. Pt ambulated 33ft with supervision ---- voiced mild weakness. Pt returned to bed with needs in reach and family at bedside.  Merrily Brittle Mobility Specialist 04/29/21, 12:25 PM

## 2021-04-29 NOTE — TOC CM/SW Note (Signed)
Patient has orders to discharge home today. Chart reviewed. PCP is Park Liter, DO. On room air. No wounds. No TOC needs identified. CSW signing off.  [place] Scrape, [place]

## 2021-04-29 NOTE — Discharge Summary (Signed)
Physician Discharge Summary   Patient: Kristi Clark MRN: [id number] DOB: Mar 12, 1950  Admit date:     04/28/2021  Discharge date: 04/29/21  Discharge Physician: Shawna Clamp   PCP: [name], DO   Recommendations at discharge:  Advised to follow-up with primary care physician in 1 week. Advised to follow-up with cardiologist Dr. Neoma Laming, appointment has been made. Patient needs Holter monitoring.  Discharge Diagnoses: Principal Problem:   Pre-syncope Active Problems:   Anemia   Allergic rhinitis   CKD (chronic kidney disease), stage II   COPD, severe (HCC)   GERD (gastroesophageal reflux disease)   Hyperlipidemia   Benign hypertension with chronic kidney disease   CAD (coronary artery disease)   Hyponatremia   Nicotine dependence   Aortic atherosclerosis (HCC)   Unintentional weight loss   Encounter for tobacco use cessation counseling   Malnutrition of moderate degree  Resolved Problems:   * No resolved hospital problems. [facility] Course: This 71 years old female with PMH significant for hyperlipidemia, hypertension, mild anemia, GERD, hyponatremia, nicotine dependence, severe COPD, presented in the ED with complaints of presyncopal episodes and feeling lightheaded.  Troponins negative.  CT head unremarkable.  Chest x-ray findings consistent with COPD.  Patient was given IV hydration and patient was given meclizine.  Patient was admitted for lightheadedness.  Echocardiogram completed,  report pending.  Patient denies any chest pain, shortness of breath, dizziness or palpitation.  Patient participated with physical therapy, orthostatic check which remains normal.  Discussed with cardiology Dr. Laurelyn Sickle PA states Patient can be discharged, patient does have appointment with Dr. Humphrey Rolls on Monday.  Patient being discharged home.  Assessment and Plan: No notes have been filed under this hospital service. Service: Hospitalist  Consultants: None Procedures  performed: Echocardiogram Disposition: Home Diet recommendation:  Discharge Diet Orders (From admission, onward)     Start     Ordered   04/29/21 0000  Diet - low sodium heart healthy        04/29/21 1604   04/29/21 0000  Diet Carb Modified        04/29/21 1604           Cardiac diet  DISCHARGE MEDICATION: Allergies as of 04/29/2021       Reactions   Clindamycin/lincomycin Hives   Prednisone Other (See Comments)   Psychosis   Amoxicillin Rash   Avelox [moxifloxacin Hcl In Nacl] Rash   Codeine Sulfate Nausea Only   Penicillins Rash   Sulfa Antibiotics Rash        Medication List     STOP taking these medications    tizanidine 2 MG capsule Commonly known as: ZANAFLEX       TAKE these medications    albuterol 108 (90 Base) MCG/ACT inhaler Commonly known as: VENTOLIN HFA Inhale 2 puffs into the lungs every 6 (six) hours as needed for wheezing or shortness of breath.   aspirin 81 MG tablet Take 81 mg by mouth daily.   atorvastatin 40 MG tablet Commonly known as: LIPITOR Take 1 tablet (40 mg total) by mouth daily.   Blood Pressure Monitor/Wrist Kit Take Blood pressure as needed, Dx: I12.9   CALCIUM 600 PO Take by mouth daily.   CENTRUM SILVER ADULT 50+ PO Take 1 tablet by mouth daily.   PreserVision AREDS 2+Multi Vit Caps Take 1 capsule by mouth in the morning and at bedtime.   clopidogrel 75 MG tablet Commonly known as: PLAVIX Take 1 tablet (75 mg total)  by mouth daily.   diclofenac Sodium 1 % Gel Commonly known as: Voltaren Apply 4 g topically 4 (four) times daily.   fluticasone 50 MCG/ACT nasal spray Commonly known as: FLONASE Place 2 sprays into both nostrils daily.   fluticasone-salmeterol 250-50 MCG/ACT Aepb Commonly known as: Advair Diskus Inhale 1 puff into the lungs in the morning and at bedtime.   hydrALAZINE 25 MG tablet Commonly known as: APRESOLINE Take 1 tablet (25 mg total) by mouth 2 (two) times daily.   isosorbide  mononitrate 60 MG 24 hr tablet Commonly known as: IMDUR Take 1 tablet (60 mg total) by mouth daily.   losartan 100 MG tablet Commonly known as: COZAAR Take 1 tablet (100 mg total) by mouth daily.   metoprolol succinate 50 MG 24 hr tablet Commonly known as: TOPROL-XL Take 1 tablet (50 mg total) by mouth daily. Take with or immediately following a meal.   nystatin 100000 UNIT/ML suspension Commonly known as: MYCOSTATIN Take 5 mLs (500,000 Units total) by mouth 4 (four) times daily.   pantoprazole 40 MG tablet Commonly known as: PROTONIX TAKE ONE TABLET BY MOUTH EVERY DAY   polyethylene glycol powder 17 GM/SCOOP powder Commonly known as: GLYCOLAX/MIRALAX Take 17 g by mouth daily as needed for moderate constipation.   PROBIOTIC-10 PO Take by mouth. Every other day   spironolactone 25 MG tablet Commonly known as: ALDACTONE Take 12.5 mg by mouth daily.   tiotropium 18 MCG inhalation capsule Commonly known as: SPIRIVA Place 1 capsule (18 mcg total) into inhaler and inhale daily.   triamcinolone cream 0.1 % Commonly known as: KENALOG Apply 1 application topically 2 (two) times daily.   ZYRTEC PO Take by mouth daily.        Follow-up Information     Park Liter P, DO Follow up in 1 week(s).   Specialty: Family Medicine Contact information: [place] [address] [phone]         Dionisio David, MD Follow up.   Specialty: Cardiology Contact information: [address] [address] [phone]                 Discharge Exam: Danley Danker Weights   04/28/21 1552 04/28/21 2052  Weight: 51.3 kg 47 kg   General exam: Appears comfortable, not in any acute distress. Respiratory system: CTA bilaterally, no wheezing, no crackles.  Normal respiratory effort. Cardiovascular system: S1-S2 heard, regular rate and rhythm, no murmur. Gastrointestinal system: Abdomen is soft, nontender, nondistended, BS+ Central nervous system: Alert and  oriented x3, no neurological deficits. Extremities: No edema, no cyanosis, no clubbing. Psychiatry: Mood, insight, judgment normal   Condition at discharge: good  The results of significant diagnostics from this hospitalization (including imaging, microbiology, ancillary and laboratory) are listed below for reference.   Imaging Studies: DG Chest 2 View  Result Date: 04/28/2021 CLINICAL DATA:  Patient with dizziness. EXAM: CHEST - 2 VIEW COMPARISON:  Chest radiograph 02/24/2021. FINDINGS: Stable cardiac and mediastinal contours. Aortic atherosclerosis. Emphysematous change. Similar-appearing scarring within the lung apices bilaterally. No large area pulmonary consolidation. No pleural effusion or pneumothorax. Thoracic spine degenerative changes. IMPRESSION: Cardiopulmonary changes compatible with COPD. No acute superimposed process. Electronically Signed   By: Lovey Newcomer M.D.   On: 04/28/2021 16:22   CT HEAD WO CONTRAST (5MM)  Result Date: 04/28/2021 CLINICAL DATA:  Dizziness, persistent/recurrent, cardiac or vascular cause suspected EXAM: CT HEAD WITHOUT CONTRAST TECHNIQUE: Contiguous axial images were obtained from the base of the skull through  the vertex without intravenous contrast. RADIATION DOSE REDUCTION: This exam was performed according to the departmental dose-optimization program which includes automated exposure control, adjustment of the mA and/or kV according to patient size and/or use of iterative reconstruction technique. COMPARISON:  CT head 03/02/2020. FINDINGS: Brain: No evidence of acute infarction, hemorrhage, hydrocephalus, extra-axial collection or mass lesion/mass effect. Mild patchy white matter hypoattenuation, nonspecific but compatible with chronic microvascular ischemic disease. Vascular: No hyperdense vessel identified. Skull: No acute fracture. Sinuses/Orbits: Visualized sinuses are clear.  Unremarkable orbits. Other: No mastoid effusions. IMPRESSION: No evidence of  acute intracranial abnormality. Electronically Signed   By: Margaretha Sheffield M.D.   On: 04/28/2021 18:04    Microbiology: Results for orders placed or performed during the hospital encounter of 04/28/21  Resp Panel by RT-PCR (Flu A&B, Covid) Nasopharyngeal Swab     Status: None   Collection Time: 04/28/21  6:38 PM   Specimen: Nasopharyngeal Swab; Nasopharyngeal(NP) swabs in vial transport medium  Result Value Ref Range Status   SARS Coronavirus 2 by RT PCR NEGATIVE NEGATIVE Final    Comment: (NOTE) SARS-CoV-2 target nucleic acids are NOT DETECTED.  The SARS-CoV-2 RNA is generally detectable in upper respiratory specimens during the acute phase of infection. The lowest concentration of SARS-CoV-2 viral copies this assay can detect is 138 copies/mL. A negative result does not preclude SARS-Cov-2 infection and should not be used as the sole basis for treatment or other patient management decisions. A negative result may occur with  improper specimen collection/handling, submission of specimen other than nasopharyngeal swab, presence of viral mutation(s) within the areas targeted by this assay, and inadequate number of viral copies(<138 copies/mL). A negative result must be combined with clinical observations, patient history, and epidemiological information. The expected result is Negative.  Fact Sheet for Patients:  EntrepreneurPulse.com.au  Fact Sheet for Healthcare Providers:  IncredibleEmployment.be  This test is no t yet approved or cleared by the [place] FDA and  has been authorized for detection and/or diagnosis of SARS-CoV-2 by FDA under an Emergency Use Authorization (EUA). This EUA will remain  in effect (meaning this test can be used) for the duration of the COVID-19 declaration under Section 564(b)(1) of the Act, 21 U.S.C.section 360bbb-3(b)(1), unless the authorization is terminated  or revoked sooner.       Influenza A by  PCR NEGATIVE NEGATIVE Final   Influenza B by PCR NEGATIVE NEGATIVE Final    Comment: (NOTE) The Xpert Xpress SARS-CoV-2/FLU/RSV plus assay is intended as an aid in the diagnosis of influenza from Nasopharyngeal swab specimens and should not be used as a sole basis for treatment. Nasal washings and aspirates are unacceptable for Xpert Xpress SARS-CoV-2/FLU/RSV testing.  Fact Sheet for Patients: EntrepreneurPulse.com.au  Fact Sheet for Healthcare Providers: IncredibleEmployment.be  This test is not yet approved or cleared by the [place] FDA and has been authorized for detection and/or diagnosis of SARS-CoV-2 by FDA under an Emergency Use Authorization (EUA). This EUA will remain in effect (meaning this test can be used) for the duration of the COVID-19 declaration under Section 564(b)(1) of the Act, 21 U.S.C. section 360bbb-3(b)(1), unless the authorization is terminated or revoked.  Performed at [facility], [address] [place], [place] [address]     Labs: CBC: Recent Labs  Lab 04/28/21 1554 04/29/21 0612  WBC 7.9 5.8  HGB 11.9* 10.8*  HCT 36.6 31.8*  MCV 97.3 94.4  PLT 224 259   Basic Metabolic Panel: Recent Labs  Lab 04/28/21 1554  04/29/21 0612  NA 129* 134*  K 4.0 3.9  CL 97* 101  CO2 24 26  GLUCOSE 117* 91  BUN 10 10  CREATININE 0.77 0.68  CALCIUM 9.0 9.1   Liver Function Tests: Recent Labs  Lab 04/28/21 1838  AST 32  ALT 22  ALKPHOS 69  BILITOT 0.5  PROT 6.9  ALBUMIN 3.6   CBG: No results for input(s): GLUCAP in the last 168 hours.  Discharge time spent: greater than 30 minutes.  Signed: Shawna Clamp, MD [facility] 04/29/2021

## 2021-04-29 NOTE — Progress Notes (Signed)
Initial Nutrition Assessment  DOCUMENTATION CODES:   Non-severe (moderate) malnutrition in context of chronic illness  INTERVENTION:   -Ensure Enlive po BID, each supplement provides 350 kcal and 20 grams of protein -MVI with minerals daily  NUTRITION DIAGNOSIS:   Moderate Malnutrition related to chronic illness (COPD) as evidenced by percent weight loss, mild fat depletion, moderate fat depletion, mild muscle depletion, moderate muscle depletion.  GOAL:   Patient will meet greater than or equal to 90% of their needs  MONITOR:   PO intake, Supplement acceptance, Diet advancement, Labs, Weight trends, Skin, I & O's  REASON FOR ASSESSMENT:   Malnutrition Screening Tool    ASSESSMENT:   [name] is a 71 year old female with history of hyperlipidemia, hypertension, mild anemia, GERD, hyponatremia, nicotine dependence, COPD severe, who presented to the emergency department for chief concerns of presyncopal and palpitation.  Pt admitted with presyncope.   Reviewed I/O's: +680 ml x 24 hours  Spoke with pt at bedside, who reports feeling a little better today. She reports a decreased appetite over the past several months, explaining that food is often unappealing to her. Pt has had a lot of stress in her personal life over the past few years, which she feels has contributed to the weight loss. She usually consumes 2 meals per day (Breakfast: 4 mini muffins and Lunch: sandwich). Pt consumes a lot of coffee and soda.    Pt reports UBW is around 120#. She estimates she has lost about 13 poounds over the past month. Reviewed wt hx; pt has experienced a 8% wt loss over the past 3 months, which is insignificant for time frame.   Discussed importance of good meal and supplement intake to promote healing. Pt acknowledged importance of trying to eat better; discussed ways to add more calories and protein in her diet. Pt amenable to Ensure and reports that she has some [employer] Breakfast at home that she is willing to try.   Medications reviewed.   Labs reviewed: Na: 134.   NUTRITION - FOCUSED PHYSICAL EXAM:  Flowsheet Row Most Recent Value  Orbital Region Mild depletion  Upper Arm Region Mild depletion  Thoracic and Lumbar Region No depletion  Buccal Region No depletion  Temple Region Mild depletion  Clavicle Bone Region Mild depletion  Clavicle and Acromion Bone Region Mild depletion  Scapular Bone Region Mild depletion  Dorsal Hand Mild depletion  Patellar Region Moderate depletion  Anterior Thigh Region Moderate depletion  Posterior Calf Region Moderate depletion  Edema (RD Assessment) None  Hair Reviewed  Eyes Reviewed  Mouth Reviewed  Skin Reviewed  Nails Reviewed       Diet Order:   Diet Order             Diet - low sodium heart healthy           Diet Carb Modified           Diet Heart Room service appropriate? Yes; Fluid consistency: Thin  Diet effective now                   EDUCATION NEEDS:   Education needs have been addressed  Skin:  Skin Assessment: Reviewed RN Assessment  Last BM:  04/28/21  Height:   Ht Readings from Last 1 Encounters:  04/28/21 4\' 11"  (1.499 m)    Weight:   Wt Readings from Last 1 Encounters:  04/28/21 47 kg    Ideal Body Weight:  44.7 kg  BMI:  Body mass index is 20.94 kg/m.  Estimated Nutritional Needs:   Kcal:  1400-1600  Protein:  65-80 grams  Fluid:  > 1.4 L    Kristi Clark, RD, LDN, [place] Registered Dietitian II Certified Diabetes Care and Education Specialist Please refer to [facility] for RD and/or RD on-call/weekend/after hours pager

## 2021-05-03 ENCOUNTER — Telehealth: Payer: Self-pay | Admitting: *Deleted

## 2021-05-03 DIAGNOSIS — E782 Mixed hyperlipidemia: Secondary | ICD-10-CM | POA: Diagnosis not present

## 2021-05-03 DIAGNOSIS — I10 Essential (primary) hypertension: Secondary | ICD-10-CM | POA: Diagnosis not present

## 2021-05-03 DIAGNOSIS — I340 Nonrheumatic mitral (valve) insufficiency: Secondary | ICD-10-CM | POA: Diagnosis not present

## 2021-05-03 DIAGNOSIS — J449 Chronic obstructive pulmonary disease, unspecified: Secondary | ICD-10-CM | POA: Diagnosis not present

## 2021-05-03 DIAGNOSIS — Z9861 Coronary angioplasty status: Secondary | ICD-10-CM | POA: Diagnosis not present

## 2021-05-03 DIAGNOSIS — I2510 Atherosclerotic heart disease of native coronary artery without angina pectoris: Secondary | ICD-10-CM | POA: Diagnosis not present

## 2021-05-03 NOTE — Telephone Encounter (Signed)
Transition Care Management Unsuccessful Follow-up Telephone Call  Date of discharge and from where:  [facility] 04-29-2021  Attempts:  1st Attempt  Reason for unsuccessful TCM follow-up call:  Unable to leave message  Hospital follow up is scheduled with [facility] 05-06-2021 @ 10:00

## 2021-05-04 NOTE — Telephone Encounter (Signed)
Transition Care Management Unsuccessful Follow-up Telephone Call  Date of discharge and from where:  [facility] 04-29-2021  Attempts:  2nd Attempt  Reason for unsuccessful TCM follow-up call Unable to leave message

## 2021-05-05 ENCOUNTER — Other Ambulatory Visit: Payer: Self-pay | Admitting: Cardiology

## 2021-05-05 ENCOUNTER — Other Ambulatory Visit
Admission: RE | Admit: 2021-05-05 | Discharge: 2021-05-05 | Disposition: A | Discharge status: Home / Self Care | Payer: Medicare Other | Attending: Cardiology | Admitting: Cardiology

## 2021-05-05 DIAGNOSIS — J449 Chronic obstructive pulmonary disease, unspecified: Secondary | ICD-10-CM

## 2021-05-05 DIAGNOSIS — R634 Abnormal weight loss: Secondary | ICD-10-CM | POA: Diagnosis not present

## 2021-05-05 DIAGNOSIS — R55 Syncope and collapse: Secondary | ICD-10-CM

## 2021-05-06 ENCOUNTER — Ambulatory Visit (INDEPENDENT_AMBULATORY_CARE_PROVIDER_SITE_OTHER): Payer: Medicare Other | Admitting: Family Medicine

## 2021-05-06 ENCOUNTER — Other Ambulatory Visit: Payer: Self-pay

## 2021-05-06 ENCOUNTER — Encounter: Payer: Self-pay | Admitting: Family Medicine

## 2021-05-06 VITALS — BP 133/64 | HR 79 | Temp 98.0°F | Ht 59.0 in | Wt 104.6 lb

## 2021-05-06 DIAGNOSIS — N182 Chronic kidney disease, stage 2 (mild): Secondary | ICD-10-CM | POA: Diagnosis not present

## 2021-05-06 DIAGNOSIS — R634 Abnormal weight loss: Secondary | ICD-10-CM | POA: Diagnosis not present

## 2021-05-06 DIAGNOSIS — R079 Chest pain, unspecified: Secondary | ICD-10-CM

## 2021-05-06 DIAGNOSIS — J449 Chronic obstructive pulmonary disease, unspecified: Secondary | ICD-10-CM | POA: Diagnosis not present

## 2021-05-06 DIAGNOSIS — R0602 Shortness of breath: Secondary | ICD-10-CM | POA: Diagnosis not present

## 2021-05-06 DIAGNOSIS — I330 Acute and subacute infective endocarditis: Secondary | ICD-10-CM | POA: Diagnosis not present

## 2021-05-06 DIAGNOSIS — D649 Anemia, unspecified: Secondary | ICD-10-CM | POA: Diagnosis not present

## 2021-05-06 MED ORDER — DICLOFENAC SODIUM 1 % EX GEL: 4.0000 g | Freq: Four times a day (QID) | CUTANEOUS | 3 refills | Status: DC

## 2021-05-06 NOTE — Assessment & Plan Note (Signed)
Rechecking labs today. Await results.  

## 2021-05-06 NOTE — Assessment & Plan Note (Signed)
Lungs clear today. Seeing pulm in April. Continue to monitor closely. ?

## 2021-05-06 NOTE — Progress Notes (Signed)
BP 133/64    Pulse 79    Temp 98 F (36.7 C) (Oral)    Ht 4' 11"  (1.499 m)    Wt 104 lb 9.6 oz (47.4 kg)    LMP  (LMP Unknown)    SpO2 99%    BMI 21.13 kg/m    Subjective:    Patient ID: Kristi Clark, female    DOB: 1950/09/26, 71 y.o.   MRN: [id number]  HPI: Kristi Clark is a 71 y.o. female  Chief Complaint  Patient presents with   Dizziness   Chest Pain    Patient states she has had chest tightness located under her left breast/ rib cage area. Patient states it has been so uncomfortable to wear a bra. Patient states she was informed that she had a growth in her heart when she had the recent EKG and went to see a Cardiologist yesterday at her appointment. Patient states she was informed to follow up in a month and then she will see Lung Doctor in April.    Transition of Care Hospital Follow up.   Hospital/Facility: [facility] D/C Physician: Dr. Dwyane Dee D/C Date: 04/29/21  Records Requested: 05/06/21 Records Received: 05/06/21 Records Reviewed: 05/06/21  Diagnoses on Discharge: Principal Problem:   Pre-syncope Active Problems:   Anemia   Allergic rhinitis   CKD (chronic kidney disease), stage II   COPD, severe (HCC)   GERD (gastroesophageal reflux disease)   Hyperlipidemia   Benign hypertension with chronic kidney disease   CAD (coronary artery disease)   Hyponatremia   Nicotine dependence   Aortic atherosclerosis ([place])   Unintentional weight loss   Encounter for tobacco use cessation counseling   Malnutrition of moderate degree    Date of interactive Contact within 48 hours of discharge: 2 attempts made and documented. Unable to make contact. 05/03/21 Contact was through: phone  Date of 7 day or 14 day face-to-face visit:  05/06/21  within 7 days  Outpatient Encounter Medications as of 05/06/2021  Medication Sig   albuterol (VENTOLIN HFA) 108 (90 Base) MCG/ACT inhaler Inhale 2 puffs into the lungs every 6 (six) hours as needed for wheezing or shortness of breath.    aspirin 81 MG tablet Take 81 mg by mouth daily.   atorvastatin (LIPITOR) 40 MG tablet Take 1 tablet (40 mg total) by mouth daily.   Blood Pressure Monitoring (BLOOD PRESSURE MONITOR/WRIST) KIT Take Blood pressure as needed, Dx: I12.9   Calcium Carbonate (CALCIUM 600 PO) Take by mouth daily.   Cetirizine HCl (ZYRTEC PO) Take by mouth daily.   clopidogrel (PLAVIX) 75 MG tablet Take 1 tablet (75 mg total) by mouth daily.   fluticasone (FLONASE) 50 MCG/ACT nasal spray Place 2 sprays into both nostrils daily.   fluticasone-salmeterol (ADVAIR DISKUS) 250-50 MCG/ACT AEPB Inhale 1 puff into the lungs in the morning and at bedtime.   hydrALAZINE (APRESOLINE) 25 MG tablet Take 1 tablet (25 mg total) by mouth 2 (two) times daily.   isosorbide mononitrate (IMDUR) 60 MG 24 hr tablet Take 1 tablet (60 mg total) by mouth daily.   losartan (COZAAR) 100 MG tablet Take 1 tablet (100 mg total) by mouth daily.   metoprolol succinate (TOPROL-XL) 50 MG 24 hr tablet Take 1 tablet (50 mg total) by mouth daily. Take with or immediately following a meal.   Multiple Vitamins-Minerals (CENTRUM SILVER ADULT 50+ PO) Take 1 tablet by mouth daily.   Multiple Vitamins-Minerals (PRESERVISION AREDS 2+MULTI VIT) CAPS Take 1 capsule by mouth  in the morning and at bedtime.   nystatin (MYCOSTATIN) 100000 UNIT/ML suspension Take 5 mLs (500,000 Units total) by mouth 4 (four) times daily.   pantoprazole (PROTONIX) 40 MG tablet TAKE ONE TABLET BY MOUTH EVERY DAY   polyethylene glycol powder (GLYCOLAX/MIRALAX) 17 GM/SCOOP powder Take 17 g by mouth daily as needed for moderate constipation.   Probiotic Product (PROBIOTIC-10 PO) Take by mouth. Every other day   spironolactone (ALDACTONE) 25 MG tablet Take 12.5 mg by mouth daily.   tiotropium (SPIRIVA) 18 MCG inhalation capsule Place 1 capsule (18 mcg total) into inhaler and inhale daily.   triamcinolone cream (KENALOG) 0.1 % Apply 1 application topically 2 (two) times daily.    [DISCONTINUED] diclofenac Sodium (VOLTAREN) 1 % GEL Apply 4 g topically 4 (four) times daily.   diclofenac Sodium (VOLTAREN) 1 % GEL Apply 4 g topically 4 (four) times daily.   No facility-administered encounter medications on file as of 05/06/2021.  Per Hospitalist: "Hospital Course: This 71 years old female with PMH significant for hyperlipidemia, hypertension, mild anemia, GERD, hyponatremia, nicotine dependence, severe COPD, presented in the ED with complaints of presyncopal episodes and feeling lightheaded.  Troponins negative.  CT head unremarkable.  Chest x-ray findings consistent with COPD.  Patient was given IV hydration and patient was given meclizine.  Patient was admitted for lightheadedness.  Echocardiogram completed,  report pending.  Patient denies any chest pain, shortness of breath, dizziness or palpitation.  Patient participated with physical therapy, orthostatic check which remains normal.  Discussed with cardiology Dr. Laurelyn Sickle PA states Patient can be discharged, patient does have appointment with Dr. Humphrey Rolls on Monday.  Patient being discharged home."  Diagnostic Tests Reviewed:   CLINICAL DATA:  Dizziness, persistent/recurrent, cardiac or vascular cause suspected   EXAM: CT HEAD WITHOUT CONTRAST   TECHNIQUE: Contiguous axial images were obtained from the base of the skull through the vertex without intravenous contrast.   RADIATION DOSE REDUCTION: This exam was performed according to the departmental dose-optimization program which includes automated exposure control, adjustment of the mA and/or kV according to patient size and/or use of iterative reconstruction technique.   COMPARISON:  CT head 03/02/2020.   FINDINGS: Brain: No evidence of acute infarction, hemorrhage, hydrocephalus, extra-axial collection or mass lesion/mass effect. Mild patchy white matter hypoattenuation, nonspecific but compatible with chronic microvascular ischemic disease.   Vascular: No  hyperdense vessel identified.   Skull: No acute fracture.   Sinuses/Orbits: Visualized sinuses are clear.  Unremarkable orbits.   Other: No mastoid effusions.   IMPRESSION: No evidence of acute intracranial abnormality.  CLINICAL DATA:  Patient with dizziness.   EXAM: CHEST - 2 VIEW   COMPARISON:  Chest radiograph 02/24/2021.   FINDINGS: Stable cardiac and mediastinal contours. Aortic atherosclerosis. Emphysematous change. Similar-appearing scarring within the lung apices bilaterally. No large area pulmonary consolidation. No pleural effusion or pneumothorax. Thoracic spine degenerative changes.   IMPRESSION: Cardiopulmonary changes compatible with COPD. No acute superimposed process.    ECHOCARDIOGRAM REPORT         Patient Name:   KAELY HOLLAN Date of Exam: 04/29/2021  Medical Rec #:  [id number]          Height:       59.0 in  Accession #:    [id number]         Weight:       103.7 lb  Date of Birth:  22-Apr-1950          BSA:  1.395 m  Patient Age:    1 years           BP:           149/52 mmHg  Patient Gender: F                  HR:           70 bpm.  Exam Location:  [facility]   Procedure: 2D Echo, Cardiac Doppler and Color Doppler   Indications:     R55 Syncope     History:         Patient has no prior history of Echocardiogram  examinations.                   COPD; Risk Factors:Hypertension, Dyslipidemia and Current                   Smoker.     Sonographer:     Cresenciano Lick RDCS  Referring Phys:  [id number] AMY N COX  Diagnosing Phys: Yolonda Kida MD   IMPRESSIONS     1. Left atrial inflow structure vegetation /thrombus/leatlet? consider  TEE.   2. Left ventricular ejection fraction, by estimation, is 55 to 60%. The  left ventricle has normal function. The left ventricle has no regional  wall motion abnormalities. Left ventricular diastolic parameters were  normal.   3. Right ventricular systolic function is normal. The right  ventricular  size is normal.   4. The mitral valve is normal in structure. Trivial mitral valve  regurgitation.   5. The aortic valve is normal in structure. Aortic valve regurgitation is  not visualized.   FINDINGS   Left Ventricle: Left ventricular ejection fraction, by estimation, is 55  to 60%. The left ventricle has normal function. The left ventricle has no  regional wall motion abnormalities. The left ventricular internal cavity  size was normal in size. There is   no left ventricular hypertrophy. Left ventricular diastolic parameters  were normal.   Right Ventricle: The right ventricular size is normal. No increase in  right ventricular wall thickness. Right ventricular systolic function is  normal.   Left Atrium: Left atrial size was normal in size.   Right Atrium: Right atrial size was normal in size.   Pericardium: There is no evidence of pericardial effusion.   Mitral Valve: The mitral valve is normal in structure. Trivial mitral  valve regurgitation.   Tricuspid Valve: The tricuspid valve is normal in structure. Tricuspid  valve regurgitation is not demonstrated.   Aortic Valve: The aortic valve is normal in structure. Aortic valve  regurgitation is not visualized.   Pulmonic Valve: The pulmonic valve was normal in structure. Pulmonic valve  regurgitation is not visualized.   Aorta: The ascending aorta was not well visualized.   IAS/Shunts: No atrial level shunt detected by color flow Doppler.   Additional Comments: Left atrial inflow structure vegetation  /thrombus/leatlet? consider TEE.      LEFT VENTRICLE  PLAX 2D  LVIDd:         3.70 cm   Diastology  LVIDs:         2.60 cm   LV e' medial:    9.79 cm/s  LV PW:         0.70 cm   LV E/e' medial:  12.7  LV IVS:        0.70 cm   LV e' lateral:   9.36 cm/s  LVOT diam:  1.40 cm   LV E/e' lateral: 13.2  LV SV:         39  LV SV Index:   28  LVOT Area:     1.54 cm      RIGHT VENTRICLE              IVC  RV Basal diam:  4.00 cm     IVC diam: 1.60 cm  RV S prime:     13.10 cm/s  TAPSE (M-mode): 2.1 cm   LEFT ATRIUM             Index        RIGHT ATRIUM           Index  LA diam:        3.50 cm 2.51 cm/m   RA Area:     11.90 cm  LA Vol (A2C):   27.7 ml 19.85 ml/m  RA Volume:   30.80 ml  22.08 ml/m  LA Vol (A4C):   19.7 ml 14.12 ml/m  LA Biplane Vol: 23.5 ml 16.84 ml/m   AORTIC VALVE  LVOT Vmax:   107.00 cm/s  LVOT Vmean:  77.100 cm/s  LVOT VTI:    0.254 m     AORTA  Ao Root diam: 3.00 cm   MITRAL VALVE                TRICUSPID VALVE  MV Area (PHT): 4.21 cm     TR Peak grad:   27.2 mmHg  MV Decel Time: 180 msec     TR Vmax:        261.00 cm/s  MV E velocity: 124.00 cm/s  MV A velocity: 85.90 cm/s   SHUNTS  MV E/A ratio:  1.44         Systemic VTI:  0.25 m                              Systemic Diam: 1.40 cm   Disposition: Home with cardiology follow up  Consults: None  Discharge Instructions: Advised to follow-up with primary care physician in 1 week. Advised to follow-up with cardiologist Dr. Neoma Laming, appointment has been made. Patient needs Holter monitoring.  Disease/illness Education: Discussed today  Home Health/Community Services Discussions/Referrals: N/A  Establishment or re-establishment of referral orders for community resources: N/A  Discussion with other health care providers: Attempted to call [facility]. Have not heard back from them yet. Contact to [facility] cardiology to see about urgent referral for TEE.   Assessment and Support of treatment regimen adherence: Good  Appointments Coordinated with: Patient and granddaughter  Education for self-management, independent living, and ADLs: Discussed today  Has been really tired and not feeling well. She has been adding in instant breakfasts and boosts which has stablized her weight. She continues to feel really tired and quite short of breath. She is following up with her pulmonologist in April.  She went to see her cardiologist's associate on Monday. Those notes are not currently available. She discussed that her cardiologist is out of the country and she is not able to do the TEE. She sent her for blood cultures yesterday- results so far show no growth. Meridee was unsure who or when she was going to have the TEE. She notes that she has been having a lot of chills as well as fatigue. She has been having a lot of R sided chest pain with radiation down  her R arm. She has been using the voltaren on her arm which has been helping, but continues to note that she can't even wear a bra due to the pain in her chest.   Relevant past medical, surgical, family and social history reviewed and updated as indicated. Interim medical history since our last visit reviewed. Allergies and medications reviewed and updated.  Review of Systems  Constitutional:  Positive for chills, diaphoresis and fatigue. Negative for activity change, appetite change, fever and unexpected weight change.  HENT: Negative.    Respiratory: Negative.    Cardiovascular: Negative.   Gastrointestinal: Negative.   Musculoskeletal: Negative.   Psychiatric/Behavioral: Negative.     Per HPI unless specifically indicated above     Objective:    BP 133/64    Pulse 79    Temp 98 F (36.7 C) (Oral)    Ht 4' 11"  (1.499 m)    Wt 104 lb 9.6 oz (47.4 kg)    LMP  (LMP Unknown)    SpO2 99%    BMI 21.13 kg/m   Wt Readings from Last 3 Encounters:  05/06/21 104 lb 9.6 oz (47.4 kg)  04/28/21 103 lb 11.2 oz (47 kg)  03/15/21 113 lb (51.3 kg)    Physical Exam Vitals and nursing note reviewed.  Constitutional:      General: She is not in acute distress.    Appearance: Normal appearance. She is not ill-appearing, toxic-appearing or diaphoretic.  HENT:     Head: Normocephalic and atraumatic.     Right Ear: External ear normal.     Left Ear: External ear normal.     Nose: Nose normal.     Mouth/Throat:     Mouth: Mucous membranes are  moist.     Pharynx: Oropharynx is clear.  Eyes:     General: No scleral icterus.       Right eye: No discharge.        Left eye: No discharge.     Extraocular Movements: Extraocular movements intact.     Conjunctiva/sclera: Conjunctivae normal.     Pupils: Pupils are equal, round, and reactive to light.  Cardiovascular:     Rate and Rhythm: Normal rate and regular rhythm.     Pulses: Normal pulses.     Heart sounds: Normal heart sounds. No murmur heard.   No friction rub. No gallop.  Pulmonary:     Effort: Pulmonary effort is normal. No respiratory distress.     Breath sounds: Normal breath sounds. No stridor. No wheezing, rhonchi or rales.  Chest:     Chest wall: No tenderness.  Musculoskeletal:        General: Normal range of motion.     Cervical back: Normal range of motion and neck supple.  Skin:    General: Skin is warm and dry.     Capillary Refill: Capillary refill takes less than 2 seconds.     Coloration: Skin is not jaundiced or pale.     Findings: No bruising, erythema, lesion or rash.  Neurological:     General: No focal deficit present.     Mental Status: She is alert and oriented to person, place, and time. Mental status is at baseline.  Psychiatric:        Mood and Affect: Mood normal.        Behavior: Behavior normal.        Thought Content: Thought content normal.        Judgment: Judgment normal.  Results for orders placed or performed during the hospital encounter of 05/05/21  Culture, blood (routine x 2)   Specimen: BLOOD  Result Value Ref Range   Specimen Description BLOOD RIGHT ANTECUBITAL    Special Requests      BOTTLES DRAWN AEROBIC AND ANAEROBIC Blood Culture adequate volume   Culture      NO GROWTH < 24 HOURS Performed at [facility], [address] [place], [place] [address]    Report Status PENDING       Assessment & Plan:   Problem List Items Addressed This Visit       Cardiovascular and Mediastinum   Valvular  vegetation - Primary    Found on ECHO in the hospital. Had follow up with cardiology on Monday. Unable to get in contact with [facility] to discuss. Awaiting phone call back. She had blood cultures drawn yesterday. Her cardiologist is currently out of the country and his associate is handling his patients while he is but is a NP/PA and unable to do a TEE. Need to figure out if this is a mass/vegetation/clot to determine treatment. This needs to be done very urgently given the risk of stroke. Will get her into see another cardiologist ASAP to arrange for TEE. Urgent referral placed today and she will be seen tomorrow. Continue to monitor closely.      Relevant Orders   Ambulatory referral to Cardiology     Respiratory   COPD, severe ([place])    Lungs clear today. Seeing pulm in April. Continue to monitor closely.        Genitourinary   CKD (chronic kidney disease), stage II    Rechecking labs today. Await results.        Other   Anemia    Rechecking labs today. Await results.      Unintentional weight loss    Stablizing. Concern for endocarditis vs. Malignancy. Await TEE.       Other Visit Diagnoses     SOB (shortness of breath)       Likely multifactorial. Concern for endocarditis or malignancy given ECHO report. Will recheck labs today. Await TEE.    Relevant Orders   CBC with Differential/Platelet   Comprehensive metabolic panel   Ambulatory referral to Cardiology   Chest pain, unspecified type       Concern for cervical radiculopathy/referred pain. Will address vavular vegetation, then consider cervical and thoracic X-rays. Cont voltaren and start lidocaine        Follow up plan: No follow-ups on file.  >40 minutes spent with patient and in coordination of care today.

## 2021-05-06 NOTE — Assessment & Plan Note (Signed)
Stablizing. Concern for endocarditis vs. Malignancy. Await TEE.  ?

## 2021-05-06 NOTE — Assessment & Plan Note (Signed)
Found on ECHO in the hospital. Had follow up with cardiology on Monday. Unable to get in contact with [facility] to discuss. Awaiting phone call back. She had blood cultures drawn yesterday. Her cardiologist is currently out of the country and his associate is handling his patients while he is but is a NP/PA and unable to do a TEE. Need to figure out if this is a mass/vegetation/clot to determine treatment. This needs to be done very urgently given the risk of stroke. Will get her into see another cardiologist ASAP to arrange for TEE. Urgent referral placed today and she will be seen tomorrow. Continue to monitor closely. ?

## 2021-05-07 DIAGNOSIS — I2510 Atherosclerotic heart disease of native coronary artery without angina pectoris: Secondary | ICD-10-CM | POA: Diagnosis not present

## 2021-05-07 DIAGNOSIS — I129 Hypertensive chronic kidney disease with stage 1 through stage 4 chronic kidney disease, or unspecified chronic kidney disease: Secondary | ICD-10-CM | POA: Diagnosis not present

## 2021-05-07 DIAGNOSIS — E785 Hyperlipidemia, unspecified: Secondary | ICD-10-CM | POA: Diagnosis not present

## 2021-05-07 DIAGNOSIS — Z720 Tobacco use: Secondary | ICD-10-CM | POA: Diagnosis not present

## 2021-05-07 DIAGNOSIS — R931 Abnormal findings on diagnostic imaging of heart and coronary circulation: Secondary | ICD-10-CM | POA: Diagnosis not present

## 2021-05-07 LAB — COMPREHENSIVE METABOLIC PANEL
ALT: 22 IU/L (ref 0–32)
AST: 28 IU/L (ref 0–40)
Albumin/Globulin Ratio: 1.8 (ref 1.2–2.2)
Albumin: 4.3 g/dL (ref 3.7–4.7)
Alkaline Phosphatase: 83 IU/L (ref 44–121)
BUN/Creatinine Ratio: 23 (ref 12–28)
BUN: 19 mg/dL (ref 8–27)
Bilirubin Total: 0.3 mg/dL (ref 0.0–1.2)
CO2: 25 mmol/L (ref 20–29)
Calcium: 10 mg/dL (ref 8.7–10.3)
Chloride: 94 mmol/L — ABNORMAL LOW (ref 96–106)
Creatinine, Ser: 0.82 mg/dL (ref 0.57–1.00)
Globulin, Total: 2.4 g/dL (ref 1.5–4.5)
Glucose: 96 mg/dL (ref 70–99)
Potassium: 4.8 mmol/L (ref 3.5–5.2)
Sodium: 131 mmol/L — ABNORMAL LOW (ref 134–144)
Total Protein: 6.7 g/dL (ref 6.0–8.5)
eGFR: 76 mL/min/{1.73_m2} (ref >59)

## 2021-05-07 LAB — CBC WITH DIFFERENTIAL/PLATELET
Basophils Absolute: 0.1 10*3/uL (ref 0.0–0.2)
Basos: 1 %
EOS (ABSOLUTE): 0.2 10*3/uL (ref 0.0–0.4)
Eos: 2 %
Hematocrit: 35.8 % (ref 34.0–46.6)
Hemoglobin: 12 g/dL (ref 11.1–15.9)
Immature Grans (Abs): 0 10*3/uL (ref 0.0–0.1)
Immature Granulocytes: 0 %
Lymphocytes Absolute: 2.7 10*3/uL (ref 0.7–3.1)
Lymphs: 37 %
MCH: 32.3 pg (ref 26.6–33.0)
MCHC: 33.5 g/dL (ref 31.5–35.7)
MCV: 96 fL (ref 79–97)
Monocytes Absolute: 0.6 10*3/uL (ref 0.1–0.9)
Monocytes: 8 %
Neutrophils Absolute: 3.8 10*3/uL (ref 1.4–7.0)
Neutrophils: 52 %
Platelets: 273 10*3/uL (ref 150–450)
RBC: 3.72 x10E6/uL — ABNORMAL LOW (ref 3.77–5.28)
RDW: 12.9 % (ref 11.7–15.4)
WBC: 7.3 10*3/uL (ref 3.4–10.8)

## 2021-05-10 LAB — CULTURE, BLOOD (ROUTINE X 2)
Culture: NO GROWTH
Special Requests: ADEQUATE

## 2021-05-11 ENCOUNTER — Other Ambulatory Visit: Payer: Self-pay

## 2021-05-11 ENCOUNTER — Ambulatory Visit
Admission: RE | Admit: 2021-05-11 | Discharge: 2021-05-11 | Disposition: A | Discharge status: Home / Self Care | Payer: Medicare Other | Source: Ambulatory Visit | Attending: Internal Medicine | Admitting: Internal Medicine

## 2021-05-11 ENCOUNTER — Encounter: Payer: Self-pay | Admitting: Certified Registered"

## 2021-05-11 ENCOUNTER — Encounter: Payer: Self-pay | Admitting: Internal Medicine

## 2021-05-11 ENCOUNTER — Encounter
Admission: RE | Disposition: A | Discharge status: Home / Self Care | Payer: Self-pay | Source: Ambulatory Visit | Attending: Internal Medicine

## 2021-05-11 DIAGNOSIS — I10 Essential (primary) hypertension: Secondary | ICD-10-CM | POA: Insufficient documentation

## 2021-05-11 DIAGNOSIS — E785 Hyperlipidemia, unspecified: Secondary | ICD-10-CM | POA: Diagnosis not present

## 2021-05-11 DIAGNOSIS — J449 Chronic obstructive pulmonary disease, unspecified: Secondary | ICD-10-CM | POA: Insufficient documentation

## 2021-05-11 DIAGNOSIS — I6359 Cerebral infarction due to unspecified occlusion or stenosis of other cerebral artery: Secondary | ICD-10-CM | POA: Diagnosis not present

## 2021-05-11 DIAGNOSIS — I700 Atherosclerosis of aorta: Secondary | ICD-10-CM | POA: Diagnosis not present

## 2021-05-11 DIAGNOSIS — R9439 Abnormal result of other cardiovascular function study: Secondary | ICD-10-CM | POA: Diagnosis not present

## 2021-05-11 HISTORY — PX: TEE WITHOUT CARDIOVERSION: SHX5443

## 2021-05-11 SURGERY — ECHOCARDIOGRAM, TRANSESOPHAGEAL
Anesthesia: Moderate Sedation

## 2021-05-11 MED ORDER — FENTANYL CITRATE (PF) 100 MCG/2ML IJ SOLN
INTRAMUSCULAR | Status: AC | PRN
  Administered 2021-05-11 (×2): 25 ug via INTRAVENOUS

## 2021-05-11 MED ORDER — SODIUM CHLORIDE FLUSH 0.9 % IV SOLN
INTRAVENOUS | Status: AC
  Filled 2021-05-11: qty 10

## 2021-05-11 MED ORDER — MIDAZOLAM HCL 2 MG/2ML IJ SOLN
INTRAMUSCULAR | Status: AC | PRN
  Administered 2021-05-11: 2 mg via INTRAVENOUS

## 2021-05-11 MED ORDER — SODIUM CHLORIDE 0.9 % IV SOLN: INTRAVENOUS | Status: DC

## 2021-05-11 MED ORDER — LIDOCAINE VISCOUS HCL 2 % MT SOLN
OROMUCOSAL | Status: AC | PRN
  Administered 2021-05-11: 15 mL via OROMUCOSAL

## 2021-05-11 MED ORDER — BUTAMBEN-TETRACAINE-BENZOCAINE 2-2-14 % EX AERO
INHALATION_SPRAY | CUTANEOUS | Status: AC | PRN
  Administered 2021-05-11: 1 via TOPICAL

## 2021-05-11 MED ORDER — FENTANYL CITRATE (PF) 100 MCG/2ML IJ SOLN
INTRAMUSCULAR | Status: AC
  Filled 2021-05-11: qty 2

## 2021-05-11 MED ORDER — MIDAZOLAM HCL 2 MG/2ML IJ SOLN
INTRAMUSCULAR | Status: AC
  Filled 2021-05-11: qty 2

## 2021-05-11 MED ORDER — BUTAMBEN-TETRACAINE-BENZOCAINE 2-2-14 % EX AERO
INHALATION_SPRAY | CUTANEOUS | Status: AC
  Filled 2021-05-11: qty 5

## 2021-05-11 MED ORDER — LIDOCAINE VISCOUS HCL 2 % MT SOLN
OROMUCOSAL | Status: AC
  Filled 2021-05-11: qty 15

## 2021-05-11 NOTE — CV Procedure (Signed)
Transesophageal echocardiogram preliminary report ? ?Kristi Clark ?[id number] ?[birth year] ? ?Preliminary diagnosis ?Large mass in right atrium by TTE ? ?Postprocedural diagnosis ?Normal LV function without evidence of endocarditis or vegetation and large echogenicity of right atrium is a congential eustachion valve which is large and a normal varient ? ?Time out ?A timeout was performed by the nursing staff and physicians specifically identifying the procedure performed, identification of the patient, the type of sedation, all allergies and medications, all pertinent medical history, and presedation assessment of nasopharynx. ?The patient and or family understand the risks of the procedure including the rare risks of death, stroke, heart attack, esophogeal perforation, sore throat, and reaction to medications given. ? ?Moderate sedation ?During this procedure the patient has received Versed 2 milligrams and fentanyl 50 micrograms to achieve appropriate moderate sedation.  The patient had continued monitoring of heart rate, oxygenation, blood pressure, respiratory rate, and extent of signs of sedation throughout the entire procedure.  The patient received this moderate sedation over a period of 16 minutes.  Both the nursing staff and I were present during the procedure when the patient had moderate sedation for 100% of the time. ? ?Treatment considerations ?No further consideration of treatment due to normal varient eustachion valve of right atrium ? ?For further details of transesophageal echocardiogram please refer to final report. ? ?Signed,  ?Corey Skains M.D. FACC ?05/11/2021 8:34 AM ? ?

## 2021-05-11 NOTE — Progress Notes (Signed)
*  PRELIMINARY RESULTS* ?Echocardiogram ?Echocardiogram Transesophageal has been performed. ? ?Kristi Clark, [place] ?05/11/2021, 8:45 AM ?

## 2021-05-12 ENCOUNTER — Encounter: Payer: Self-pay | Admitting: Internal Medicine

## 2021-05-25 ENCOUNTER — Other Ambulatory Visit: Payer: Self-pay

## 2021-05-25 ENCOUNTER — Ambulatory Visit (INDEPENDENT_AMBULATORY_CARE_PROVIDER_SITE_OTHER): Payer: Medicare Other | Admitting: Family Medicine

## 2021-05-25 ENCOUNTER — Encounter: Payer: Self-pay | Admitting: Family Medicine

## 2021-05-25 VITALS — BP 127/57 | HR 80 | Temp 98.2°F | Wt 103.6 lb

## 2021-05-25 DIAGNOSIS — R0781 Pleurodynia: Secondary | ICD-10-CM

## 2021-05-25 DIAGNOSIS — I330 Acute and subacute infective endocarditis: Secondary | ICD-10-CM

## 2021-05-25 MED ORDER — GABAPENTIN 100 MG PO CAPS: 100.0000 mg | ORAL_CAPSULE | Freq: Every day | ORAL | 3 refills | Status: DC

## 2021-05-25 NOTE — Progress Notes (Signed)
? ?BP (!) 127/57   Pulse 80   Temp 98.2 ?F (36.8 ?C)   Wt 103 lb 9.6 oz (47 kg)   LMP  (LMP Unknown)   SpO2 99%   BMI 20.92 kg/m?   ? ?Subjective:  ? ? Patient ID: Kristi Clark, female    DOB: 22-Aug-1950, 71 y.o.   MRN: [id number] ? ?HPI: ?Kristi Clark is a 71 y.o. female ? ?Chief Complaint  ?Patient presents with  ? Valvular Vegetation  ?  Patient recently saw cardiology and had a TEE procedure done. Patient states she still occassionally has chest pain, patient has follow up with cardiology next Tuesday.   ? ?Kristi Clark presents today for follow up on her rib pain and chest pain. She has been having pain in her L ribs. Aching and sore. She notes that it's worse with wearing a bra. It's worse with pressure and better without pressure on it. No radiation. No rash. Stable SOB. No other concerns or complaints at this time.  ? ?Relevant past medical, surgical, family and social history reviewed and updated as indicated. Interim medical history since our last visit reviewed. ?Allergies and medications reviewed and updated. ? ?Review of Systems  ?Constitutional: Negative.   ?Respiratory: Negative.    ?Cardiovascular:  Positive for chest pain. Negative for palpitations and leg swelling.  ?Gastrointestinal: Negative.   ?Musculoskeletal: Negative.   ?Neurological: Negative.   ?Psychiatric/Behavioral: Negative.    ? ?Per HPI unless specifically indicated above ? ?   ?Objective:  ?  ?BP (!) 127/57   Pulse 80   Temp 98.2 ?F (36.8 ?C)   Wt 103 lb 9.6 oz (47 kg)   LMP  (LMP Unknown)   SpO2 99%   BMI 20.92 kg/m?   ?Wt Readings from Last 3 Encounters:  ?05/25/21 103 lb 9.6 oz (47 kg)  ?05/11/21 104 lb (47.2 kg)  ?05/06/21 104 lb 9.6 oz (47.4 kg)  ?  ?Physical Exam ?Vitals and nursing note reviewed.  ?Constitutional:   ?   General: She is not in acute distress. ?   Appearance: Normal appearance. She is not ill-appearing, toxic-appearing or diaphoretic.  ?HENT:  ?   Head: Normocephalic and atraumatic.  ?   Right  Ear: External ear normal.  ?   Left Ear: External ear normal.  ?   Nose: Nose normal.  ?   Mouth/Throat:  ?   Mouth: Mucous membranes are moist.  ?   Pharynx: Oropharynx is clear.  ?Eyes:  ?   General: No scleral icterus.    ?   Right eye: No discharge.     ?   Left eye: No discharge.  ?   Extraocular Movements: Extraocular movements intact.  ?   Conjunctiva/sclera: Conjunctivae normal.  ?   Pupils: Pupils are equal, round, and reactive to light.  ?Cardiovascular:  ?   Rate and Rhythm: Normal rate and regular rhythm.  ?   Pulses: Normal pulses.  ?   Heart sounds: Normal heart sounds. No murmur heard. ?  No friction rub. No gallop.  ?Pulmonary:  ?   Effort: Pulmonary effort is normal. No respiratory distress.  ?   Breath sounds: Normal breath sounds. No stridor. No wheezing, rhonchi or rales.  ?Chest:  ?   Chest wall: No tenderness.  ?Musculoskeletal:     ?   General: Normal range of motion.  ?   Cervical back: Normal range of motion and neck supple.  ?Skin: ?   General: Skin is  warm and dry.  ?   Capillary Refill: Capillary refill takes less than 2 seconds.  ?   Coloration: Skin is not jaundiced or pale.  ?   Findings: No bruising, erythema, lesion or rash.  ?Neurological:  ?   General: No focal deficit present.  ?   Mental Status: She is alert and oriented to person, place, and time. Mental status is at baseline.  ?Psychiatric:     ?   Mood and Affect: Mood normal.     ?   Behavior: Behavior normal.     ?   Thought Content: Thought content normal.     ?   Judgment: Judgment normal.  ? ? ?Results for orders placed or performed in visit on 05/06/21  ?CBC with Differential/Platelet  ?Result Value Ref Range  ? WBC 7.3 3.4 - 10.8 x10E3/uL  ? RBC 3.72 (L) 3.77 - 5.28 x10E6/uL  ? Hemoglobin 12.0 11.1 - 15.9 g/dL  ? Hematocrit 35.8 34.0 - 46.6 %  ? MCV 96 79 - 97 fL  ? MCH 32.3 26.6 - 33.0 pg  ? MCHC 33.5 31.5 - 35.7 g/dL  ? RDW 12.9 11.7 - 15.4 %  ? Platelets 273 150 - 450 x10E3/uL  ? Neutrophils 52 Not Estab. %  ? Lymphs  37 Not Estab. %  ? Monocytes 8 Not Estab. %  ? Eos 2 Not Estab. %  ? Basos 1 Not Estab. %  ? Neutrophils Absolute 3.8 1.4 - 7.0 x10E3/uL  ? Lymphocytes Absolute 2.7 0.7 - 3.1 x10E3/uL  ? Monocytes Absolute 0.6 0.1 - 0.9 x10E3/uL  ? EOS (ABSOLUTE) 0.2 0.0 - 0.4 x10E3/uL  ? Basophils Absolute 0.1 0.0 - 0.2 x10E3/uL  ? Immature Granulocytes 0 Not Estab. %  ? Immature Grans (Abs) 0.0 0.0 - 0.1 x10E3/uL  ?Comprehensive metabolic panel  ?Result Value Ref Range  ? Glucose 96 70 - 99 mg/dL  ? BUN 19 8 - 27 mg/dL  ? Creatinine, Ser 0.82 0.57 - 1.00 mg/dL  ? eGFR 76 >59 mL/min/1.73  ? BUN/Creatinine Ratio 23 12 - 28  ? Sodium 131 (L) 134 - 144 mmol/L  ? Potassium 4.8 3.5 - 5.2 mmol/L  ? Chloride 94 (L) 96 - 106 mmol/L  ? CO2 25 20 - 29 mmol/L  ? Calcium 10.0 8.7 - 10.3 mg/dL  ? Total Protein 6.7 6.0 - 8.5 g/dL  ? Albumin 4.3 3.7 - 4.7 g/dL  ? Globulin, Total 2.4 1.5 - 4.5 g/dL  ? Albumin/Globulin Ratio 1.8 1.2 - 2.2  ? Bilirubin Total 0.3 0.0 - 1.2 mg/dL  ? Alkaline Phosphatase 83 44 - 121 IU/L  ? AST 28 0 - 40 IU/L  ? ALT 22 0 - 32 IU/L  ? ?   ?Assessment & Plan:  ? ?Problem List Items Addressed This Visit   ? ?  ? Cardiovascular and Mediastinum  ? Valvular vegetation  ?  TEE showed large but normal Eustachiam valve which is what was seen by TTE. No concerns for endocarditis or clot. Continue to monitor. Call with any concerns.  ?  ?  ? ?Other Visit Diagnoses   ? ? Rib pain    -  Primary  ? Will start gabapentin at bedtime. Recheck in about a month. Call with any concerns. Continue to monitor.   ? ?  ?  ? ?Follow up plan: ?Return As scheduled. ? ? ? ? ? ?

## 2021-05-25 NOTE — Assessment & Plan Note (Signed)
TEE showed large but normal Eustachiam valve which is what was seen by TTE. No concerns for endocarditis or clot. Continue to monitor. Call with any concerns.  ?

## 2021-05-27 ENCOUNTER — Ambulatory Visit: Payer: Self-pay | Admitting: *Deleted

## 2021-05-27 NOTE — Telephone Encounter (Signed)
I am not physically in the office tomorrow morning. Please double book for 1PM ?

## 2021-05-27 NOTE — Telephone Encounter (Signed)
?  Chief Complaint: abd pain ?Symptoms: vomiting ?Frequency: off and on ?Pertinent Negatives: Patient denies fever ?Disposition: '[]'$ ED /'[]'$ Urgent Care (no appt availability in office) / '[x]'$ Appointment(In office/virtual)/ '[]'$  [facility]/ '[]'$ Home Care/ '[]'$ Refused Recommended Disposition /'[]'$ [facility]/ '[]'$  Follow-up with PCP ?Additional Notes: Pt seen in office this week. Pt does not want to go to UC/ED. Advised if abd pain suddenly changes she should go. She has appt in morning with Dr. Wynetta Emery again. ? ?Reason for Disposition ? [1] MODERATE pain (e.g., interferes with normal activities) AND [2] pain comes and goes (cramps) AND [3] present > 24 hours  (Exception: pain with Vomiting or Diarrhea - see that Guideline) ? ?Answer Assessment - Initial Assessment Questions ?1. LOCATION: "Where does it hurt?"  ?    All over abdomen, lost 12 lbs 6 months ?2. RADIATION: "Does the pain shoot anywhere else?" (e.g., chest, back) ?    Does not shoot but hurts under breast ?3. ONSET: "When did the pain begin?" (e.g., minutes, hours or days ago)  ?    Nausea, vomiting now ?4. SUDDEN: "Gradual or sudden onset?" ?    Gradual but worsening ?5. PATTERN "Does the pain come and go, or is it constant?" ?   - If constant: "Is it getting better, staying the same, or worsening?"  ?    (Note: Constant means the pain never goes away completely; most serious pain is constant and it progresses)  ?   - If intermittent: "How long does it last?" "Do you have pain now?" ?    (Note: Intermittent means the pain goes away completely between bouts) ?    Gets worse back and worth ?6. SEVERITY: "How bad is the pain?"  (e.g., Scale 1-10; mild, moderate, or severe) ?  - MILD (1-3): doesn't interfere with normal activities, abdomen soft and not tender to touch  ?  - MODERATE (4-7): interferes with normal activities or awakens from sleep, abdomen tender to touch  ?  - SEVERE (8-10): excruciating pain, doubled over, unable to do any normal  activities  ?    7 ?7. RECURRENT SYMPTOM: "Have you ever had this type of stomach pain before?" If Yes, ask: "When was the last time?" and "What happened that time?"  ?    Never this bad ?8. CAUSE: "What do you think is causing the stomach pain?" ?    unsure ?9. RELIEVING/AGGRAVATING FACTORS: "What makes it better or worse?" (e.g., movement, antacids, bowel movement) ?    BM today but just a little ?10. OTHER SYMPTOMS: "Do you have any other symptoms?" (e.g., back pain, diarrhea, fever, urination pain, vomiting) ?      Chills, but not fever, vomiting,  ?11. PREGNANCY: "Is there any chance you are pregnant?" "When was your last menstrual period?" ?      Na ? ?Protocols used: Abdominal Pain - Female-A-AH ? ?

## 2021-05-28 ENCOUNTER — Other Ambulatory Visit: Payer: Self-pay

## 2021-05-28 ENCOUNTER — Encounter: Payer: Self-pay | Admitting: Family Medicine

## 2021-05-28 ENCOUNTER — Ambulatory Visit (INDEPENDENT_AMBULATORY_CARE_PROVIDER_SITE_OTHER): Payer: Medicare Other | Admitting: Family Medicine

## 2021-05-28 VITALS — BP 113/75 | HR 95 | Temp 98.7°F | Wt 101.6 lb

## 2021-05-28 DIAGNOSIS — R0781 Pleurodynia: Secondary | ICD-10-CM

## 2021-05-28 DIAGNOSIS — R112 Nausea with vomiting, unspecified: Secondary | ICD-10-CM | POA: Diagnosis not present

## 2021-05-28 DIAGNOSIS — R079 Chest pain, unspecified: Secondary | ICD-10-CM | POA: Diagnosis not present

## 2021-05-28 MED ORDER — SUCRALFATE 1 G PO TABS: 1.0000 g | ORAL_TABLET | Freq: Three times a day (TID) | ORAL | 1 refills | Status: DC

## 2021-05-28 MED ORDER — ONDANSETRON HCL 4 MG PO TABS: 4.0000 mg | ORAL_TABLET | Freq: Three times a day (TID) | ORAL | 0 refills | Status: DC | PRN

## 2021-05-28 MED ORDER — PANTOPRAZOLE SODIUM 40 MG PO TBEC: 40.0000 mg | DELAYED_RELEASE_TABLET | Freq: Two times a day (BID) | ORAL | 2 refills | Status: DC

## 2021-05-28 NOTE — Progress Notes (Signed)
? ?BP 113/75   Pulse 95   Temp 98.7 ?F (37.1 ?C)   Wt 101 lb 9.6 oz (46.1 kg)   LMP  (LMP Unknown)   SpO2 98%   BMI 20.52 kg/m?   ? ?Subjective:  ? ? Patient ID: Kristi Clark, female    DOB: Jun 29, 1950, 71 y.o.   MRN: [id number] ? ?HPI: ?Kristi Clark is a 71 y.o. female ? ?Chief Complaint  ?Patient presents with  ? Abdominal Pain  ?  Patient states she has been nauseous for a few days. Patient was vomiting last week and it stopped since then has dry heaves. Patient pain is under her left ribs and lower back, has not started gabapentin.   ? ?Has been feeling nauseous after eating, then gets nervous and then felt dizzy ? ?ABDOMINAL PAIN- ?Duration: maybe 10 days, but had a few days of feeling better, then started with dry heaving a couple of days ago ?Onset: sudden ?Severity: moderate ?Quality: nauseous, rib and chest ?Location:  chest and ribs and back  ?Radiation: yes into her jaw and upper back ?Frequency: intermittent ?Alleviating factors:  ?Aggravating factors: eating ?Status: worse ?Treatments attempted:  gas pills ?Fever: no ?Nausea: yes ?Vomiting: yes ?Weight loss: yes ?Decreased appetite: yes ?Diarrhea: no ?Constipation: yes ?Blood in stool: no ?Heartburn: yes ?Jaundice: no ?Rash: no ?Dysuria/urinary frequency: no ?Hematuria: no ?History of sexually transmitted disease: no ?Recurrent NSAID use: no ? ? ?Relevant past medical, surgical, family and social history reviewed and updated as indicated. Interim medical history since our last visit reviewed. ?Allergies and medications reviewed and updated. ? ?Review of Systems  ?Constitutional:  Positive for appetite change, chills and unexpected weight change. Negative for activity change, diaphoresis, fatigue and fever.  ?HENT:  Positive for congestion and nosebleeds. Negative for dental problem, drooling, ear discharge, ear pain, facial swelling, hearing loss, mouth sores, postnasal drip, rhinorrhea, sinus pressure, sinus pain, sneezing, sore  throat, tinnitus, trouble swallowing and voice change.   ?Eyes: Negative.   ?Respiratory: Negative.    ?Cardiovascular:  Positive for chest pain. Negative for palpitations and leg swelling.  ?Gastrointestinal:  Positive for abdominal pain, nausea and vomiting. Negative for abdominal distention, anal bleeding, blood in stool, constipation, diarrhea and rectal pain.  ?Neurological: Negative.   ?Psychiatric/Behavioral: Negative.    ? ?Per HPI unless specifically indicated above ? ?   ?Objective:  ?  ?BP 113/75   Pulse 95   Temp 98.7 ?F (37.1 ?C)   Wt 101 lb 9.6 oz (46.1 kg)   LMP  (LMP Unknown)   SpO2 98%   BMI 20.52 kg/m?   ?Wt Readings from Last 3 Encounters:  ?05/28/21 101 lb 9.6 oz (46.1 kg)  ?05/25/21 103 lb 9.6 oz (47 kg)  ?05/11/21 104 lb (47.2 kg)  ?  ?Physical Exam ?Vitals and nursing note reviewed.  ?Constitutional:   ?   General: She is not in acute distress. ?   Appearance: Normal appearance. She is not ill-appearing, toxic-appearing or diaphoretic.  ?HENT:  ?   Head: Normocephalic and atraumatic.  ?   Right Ear: External ear normal.  ?   Left Ear: External ear normal.  ?   Nose: Nose normal.  ?   Mouth/Throat:  ?   Mouth: Mucous membranes are moist.  ?   Pharynx: Oropharynx is clear.  ?Eyes:  ?   General: No scleral icterus.    ?   Right eye: No discharge.     ?   Left eye:  No discharge.  ?   Extraocular Movements: Extraocular movements intact.  ?   Conjunctiva/sclera: Conjunctivae normal.  ?   Pupils: Pupils are equal, round, and reactive to light.  ?Cardiovascular:  ?   Rate and Rhythm: Normal rate and regular rhythm.  ?   Pulses: Normal pulses.  ?   Heart sounds: Murmur heard.  ?  No friction rub. No gallop.  ?Pulmonary:  ?   Effort: Pulmonary effort is normal. No respiratory distress.  ?   Breath sounds: Normal breath sounds. No stridor. No wheezing, rhonchi or rales.  ?Chest:  ?   Chest wall: No tenderness.  ?Abdominal:  ?   General: Abdomen is flat. Bowel sounds are normal. There is no  distension.  ?   Palpations: Abdomen is soft. There is no mass.  ?   Tenderness: There is abdominal tenderness (diffusely mildly tender). There is no right CVA tenderness, left CVA tenderness, guarding or rebound.  ?   Hernia: No hernia is present.  ?Musculoskeletal:     ?   General: Normal range of motion.  ?   Cervical back: Normal range of motion and neck supple.  ?Skin: ?   General: Skin is warm and dry.  ?   Capillary Refill: Capillary refill takes less than 2 seconds.  ?   Coloration: Skin is not jaundiced or pale.  ?   Findings: No bruising, erythema, lesion or rash.  ?Neurological:  ?   General: No focal deficit present.  ?   Mental Status: She is alert and oriented to person, place, and time. Mental status is at baseline.  ?Psychiatric:     ?   Mood and Affect: Mood normal.     ?   Behavior: Behavior normal.     ?   Thought Content: Thought content normal.     ?   Judgment: Judgment normal.  ? ? ?Results for orders placed or performed in visit on 05/06/21  ?CBC with Differential/Platelet  ?Result Value Ref Range  ? WBC 7.3 3.4 - 10.8 x10E3/uL  ? RBC 3.72 (L) 3.77 - 5.28 x10E6/uL  ? Hemoglobin 12.0 11.1 - 15.9 g/dL  ? Hematocrit 35.8 34.0 - 46.6 %  ? MCV 96 79 - 97 fL  ? MCH 32.3 26.6 - 33.0 pg  ? MCHC 33.5 31.5 - 35.7 g/dL  ? RDW 12.9 11.7 - 15.4 %  ? Platelets 273 150 - 450 x10E3/uL  ? Neutrophils 52 Not Estab. %  ? Lymphs 37 Not Estab. %  ? Monocytes 8 Not Estab. %  ? Eos 2 Not Estab. %  ? Basos 1 Not Estab. %  ? Neutrophils Absolute 3.8 1.4 - 7.0 x10E3/uL  ? Lymphocytes Absolute 2.7 0.7 - 3.1 x10E3/uL  ? Monocytes Absolute 0.6 0.1 - 0.9 x10E3/uL  ? EOS (ABSOLUTE) 0.2 0.0 - 0.4 x10E3/uL  ? Basophils Absolute 0.1 0.0 - 0.2 x10E3/uL  ? Immature Granulocytes 0 Not Estab. %  ? Immature Grans (Abs) 0.0 0.0 - 0.1 x10E3/uL  ?Comprehensive metabolic panel  ?Result Value Ref Range  ? Glucose 96 70 - 99 mg/dL  ? BUN 19 8 - 27 mg/dL  ? Creatinine, Ser 0.82 0.57 - 1.00 mg/dL  ? eGFR 76 >59 mL/min/1.73  ?  BUN/Creatinine Ratio 23 12 - 28  ? Sodium 131 (L) 134 - 144 mmol/L  ? Potassium 4.8 3.5 - 5.2 mmol/L  ? Chloride 94 (L) 96 - 106 mmol/L  ? CO2 25 20 - 29 mmol/L  ?  Calcium 10.0 8.7 - 10.3 mg/dL  ? Total Protein 6.7 6.0 - 8.5 g/dL  ? Albumin 4.3 3.7 - 4.7 g/dL  ? Globulin, Total 2.4 1.5 - 4.5 g/dL  ? Albumin/Globulin Ratio 1.8 1.2 - 2.2  ? Bilirubin Total 0.3 0.0 - 1.2 mg/dL  ? Alkaline Phosphatase 83 44 - 121 IU/L  ? AST 28 0 - 40 IU/L  ? ALT 22 0 - 32 IU/L  ? ?   ?Assessment & Plan:  ? ?Problem List Items Addressed This Visit   ? ?  ? Digestive  ? Nausea and vomiting  ?  Unclear if chest pain related or due to GERD. Will increase her protonix to BID and start carafate and zofran. Checking labs today. Will recheck in 1 week. If not better, consider CT abdomen pelvis vs referral to GI. Continue to monitor closely. ?  ?  ? Relevant Orders  ? Comprehensive metabolic panel  ? CBC with Differential/Platelet  ? ?Other Visit Diagnoses   ? ? Chest pain, unspecified type    -  Primary  ? Seeing cardiology on Tuesday. Recent work up. Warning signs to go to the ER discussed. EKG normal. Continue to monitor.   ? Relevant Orders  ? EKG 12-Lead (Completed)  ? Rib pain      ? Encouraged her to start her gabapentin. Call with any concerns.   ? ?  ?  ? ?Follow up plan: ?Return 7-10 days. ? ? ? ? ? ?

## 2021-05-28 NOTE — Assessment & Plan Note (Signed)
Unclear if chest pain related or due to GERD. Will increase her protonix to BID and start carafate and zofran. Checking labs today. Will recheck in 1 week. If not better, consider CT abdomen pelvis vs referral to GI. Continue to monitor closely. ?

## 2021-05-29 LAB — COMPREHENSIVE METABOLIC PANEL
ALT: 25 IU/L (ref 0–32)
AST: 24 IU/L (ref 0–40)
Albumin/Globulin Ratio: 2 (ref 1.2–2.2)
Albumin: 4.5 g/dL (ref 3.7–4.7)
Alkaline Phosphatase: 95 IU/L (ref 44–121)
BUN/Creatinine Ratio: 18 (ref 12–28)
BUN: 17 mg/dL (ref 8–27)
Bilirubin Total: 0.3 mg/dL (ref 0.0–1.2)
CO2: 23 mmol/L (ref 20–29)
Calcium: 9.8 mg/dL (ref 8.7–10.3)
Chloride: 91 mmol/L — ABNORMAL LOW (ref 96–106)
Creatinine, Ser: 0.95 mg/dL (ref 0.57–1.00)
Globulin, Total: 2.2 g/dL (ref 1.5–4.5)
Glucose: 117 mg/dL — ABNORMAL HIGH (ref 70–99)
Potassium: 4.4 mmol/L (ref 3.5–5.2)
Sodium: 130 mmol/L — ABNORMAL LOW (ref 134–144)
Total Protein: 6.7 g/dL (ref 6.0–8.5)
eGFR: 64 mL/min/{1.73_m2} (ref >59)

## 2021-05-29 LAB — CBC WITH DIFFERENTIAL/PLATELET
Basophils Absolute: 0.1 10*3/uL (ref 0.0–0.2)
Basos: 0 %
EOS (ABSOLUTE): 0 10*3/uL (ref 0.0–0.4)
Eos: 0 %
Hematocrit: 32.9 % — ABNORMAL LOW (ref 34.0–46.6)
Hemoglobin: 11.2 g/dL (ref 11.1–15.9)
Immature Grans (Abs): 0 10*3/uL (ref 0.0–0.1)
Immature Granulocytes: 0 %
Lymphocytes Absolute: 2.6 10*3/uL (ref 0.7–3.1)
Lymphs: 21 %
MCH: 32.2 pg (ref 26.6–33.0)
MCHC: 34 g/dL (ref 31.5–35.7)
MCV: 95 fL (ref 79–97)
Monocytes Absolute: 1 10*3/uL — ABNORMAL HIGH (ref 0.1–0.9)
Monocytes: 8 %
Neutrophils Absolute: 8.8 10*3/uL — ABNORMAL HIGH (ref 1.4–7.0)
Neutrophils: 71 %
Platelets: 238 10*3/uL (ref 150–450)
RBC: 3.48 x10E6/uL — ABNORMAL LOW (ref 3.77–5.28)
RDW: 12.8 % (ref 11.7–15.4)
WBC: 12.5 10*3/uL — ABNORMAL HIGH (ref 3.4–10.8)

## 2021-05-30 NOTE — Progress Notes (Signed)
Interpreted by me on 3/34/23. NSR at 81bpm, no ST segment changes.

## 2021-05-31 ENCOUNTER — Encounter: Payer: Self-pay | Admitting: Family Medicine

## 2021-05-31 ENCOUNTER — Ambulatory Visit (INDEPENDENT_AMBULATORY_CARE_PROVIDER_SITE_OTHER): Payer: Medicare Other | Admitting: *Deleted

## 2021-05-31 DIAGNOSIS — Z0000 Encounter for general adult medical examination without abnormal findings: Secondary | ICD-10-CM

## 2021-05-31 NOTE — Progress Notes (Signed)
? ?Subjective:  ? Kristi Clark is a 71 y.o. female who presents for an Initial Medicare Annual Wellness Visit. ? ?I connected with  [name] on 05/31/21 by a  telephone enabled telemedicine application and verified that I am speaking with the correct person using two identifiers. ?  ?I discussed the limitations of evaluation and management by telemedicine. The patient expressed understanding and agreed to proceed. ? ?Patient location: home ? ?Provider location: Tele-Health not in office ? ? ?Review of Systems    ? ?Cardiac Risk Factors include: advanced age (>11mn, >>49women);hypertension;smoking/ tobacco exposure ? ?   ?Objective:  ?  ?Today's Vitals  ? ?There is no height or weight on file to calculate BMI. ? ? ?  05/31/2021  ?  1:02 PM 05/11/2021  ?  7:42 AM 04/28/2021  ?  3:53 PM 05/29/2020  ?  1:48 PM 03/02/2020  ?  3:03 PM 02/17/2020  ?  5:58 PM 02/17/2020  ? 10:07 AM  ?Advanced Directives  ?Does Patient Have a Medical Advance Directive? Yes Yes No Yes No Yes No  ?Type of AIndustrial/product designerof AFrontier Oil CorporationPower of ASpanish FortLiving will  Healthcare Power of Attorney   ?Does patient want to make changes to medical advance directive?     No - Patient declined Yes (ED - Information included in AVS)   ?Copy of HLawrencein Chart? Yes - validated most recent copy scanned in chart (See row information)   No - copy requested     ?Would patient like information on creating a medical advance directive?   No - Patient declined  No - Patient declined No - Patient declined   ? ? ?Current Medications (verified) ?Outpatient Encounter Medications as of 05/31/2021  ?Medication Sig  ? albuterol (VENTOLIN HFA) 108 (90 Base) MCG/ACT inhaler Inhale 2 puffs into the lungs every 6 (six) hours as needed for wheezing or shortness of breath.  ? aspirin 81 MG tablet Take 81 mg by mouth daily.  ? atorvastatin (LIPITOR) 40 MG tablet Take 1 tablet (40 mg  total) by mouth daily.  ? Blood Pressure Monitoring (BLOOD PRESSURE MONITOR/WRIST) KIT Take Blood pressure as needed, Dx: I12.9  ? Calcium Carbonate (CALCIUM 600 PO) Take by mouth daily.  ? Cetirizine HCl (ZYRTEC PO) Take by mouth daily.  ? clopidogrel (PLAVIX) 75 MG tablet Take 1 tablet (75 mg total) by mouth daily.  ? diclofenac Sodium (VOLTAREN) 1 % GEL Apply 4 g topically 4 (four) times daily.  ? fluticasone (FLONASE) 50 MCG/ACT nasal spray Place 2 sprays into both nostrils daily.  ? fluticasone-salmeterol (ADVAIR DISKUS) 250-50 MCG/ACT AEPB Inhale 1 puff into the lungs in the morning and at bedtime.  ? gabapentin (NEURONTIN) 100 MG capsule Take 1 capsule (100 mg total) by mouth at bedtime.  ? hydrALAZINE (APRESOLINE) 25 MG tablet Take 1 tablet (25 mg total) by mouth 2 (two) times daily.  ? isosorbide mononitrate (IMDUR) 60 MG 24 hr tablet Take 1 tablet (60 mg total) by mouth daily.  ? losartan (COZAAR) 100 MG tablet Take 1 tablet (100 mg total) by mouth daily.  ? metoprolol succinate (TOPROL-XL) 50 MG 24 hr tablet Take 1 tablet (50 mg total) by mouth daily. Take with or immediately following a meal.  ? Multiple Vitamins-Minerals (CENTRUM SILVER ADULT 50+ PO) Take 1 tablet by mouth daily.  ? Multiple Vitamins-Minerals (PRESERVISION AREDS 2+MULTI VIT) CAPS Take 1 capsule by mouth in  the morning and at bedtime.  ? nystatin (MYCOSTATIN) 100000 UNIT/ML suspension Take 5 mLs (500,000 Units total) by mouth 4 (four) times daily.  ? ondansetron (ZOFRAN) 4 MG tablet Take 1 tablet (4 mg total) by mouth every 8 (eight) hours as needed for nausea or vomiting.  ? pantoprazole (PROTONIX) 40 MG tablet Take 1 tablet (40 mg total) by mouth 2 (two) times daily.  ? polyethylene glycol powder (GLYCOLAX/MIRALAX) 17 GM/SCOOP powder Take 17 g by mouth daily as needed for moderate constipation.  ? Probiotic Product (PROBIOTIC-10 PO) Take by mouth. Every other day  ? spironolactone (ALDACTONE) 25 MG tablet Take 12.5 mg by mouth daily.   ? sucralfate (CARAFATE) 1 g tablet Take 1 tablet (1 g total) by mouth 4 (four) times daily -  with meals and at bedtime.  ? tiotropium (SPIRIVA) 18 MCG inhalation capsule Place 1 capsule (18 mcg total) into inhaler and inhale daily.  ? triamcinolone cream (KENALOG) 0.1 % Apply 1 application topically 2 (two) times daily.  ? ?No facility-administered encounter medications on file as of 05/31/2021.  ? ? ?Allergies (verified) ?Clindamycin/lincomycin, Prednisone, Amoxicillin, Avelox [moxifloxacin hcl in nacl], Codeine sulfate, Penicillins, and Sulfa antibiotics  ? ?History: ?Past Medical History:  ?Diagnosis Date  ? AK (actinic keratosis) 10/25/2017  ? left forehead  ? BCC (basal cell carcinoma) 10/25/2017  ? left upper lateral eyelid, Moh's  ? COPD (chronic obstructive pulmonary disease) ([place])   ? Depression   ? GERD (gastroesophageal reflux disease)   ? Hyperlipidemia   ? Hypertension   ? Menopause   ? Osteopenia   ? Tobacco abuse   ? ?Past Surgical History:  ?Procedure Laterality Date  ? CARDIAC CATHETERIZATION    ? CHOLECYSTECTOMY  2014  ? COLONOSCOPY WITH PROPOFOL N/A 01/16/2018  ? Procedure: COLONOSCOPY WITH PROPOFOL;  Surgeon: Lollie Sails, MD;  Location: [facility] ENDOSCOPY;  Service: Endoscopy;  Laterality: N/A;  ? ESOPHAGOGASTRODUODENOSCOPY (EGD) WITH PROPOFOL N/A 01/16/2018  ? Procedure: ESOPHAGOGASTRODUODENOSCOPY (EGD) WITH PROPOFOL;  Surgeon: Lollie Sails, MD;  Location[facility] ENDOSCOPY;  Service: Endoscopy;  Laterality: N/A;  ? heart stent    ? TEE WITHOUT CARDIOVERSION N/A 05/11/2021  ? Procedure: TRANSESOPHAGEAL ECHOCARDIOGRAM (TEE);  Surgeon: Corey Skains, MD;  Location: [facility] ORS;  Service: Cardiovascular;  Laterality: N/A;  ? TOTAL ABDOMINAL HYSTERECTOMY    ? ?Family History  ?Problem Relation Age of Onset  ? Cancer Mother   ? Stroke Mother   ? Heart disease Father   ? Hyperlipidemia Father   ? Cancer Sister   ? Diabetes Sister   ? Breast cancer Sister 68  ? Diabetes Brother   ? Asthma Son   ?  Cancer Son   ? Diabetes Daughter   ? Hypertension Daughter   ? Cancer Maternal Grandmother   ?     gallbladder  ? Diabetes Brother   ? Heart disease Brother   ? Breast cancer Paternal Aunt   ? ?Social History  ? ?Socioeconomic History  ? Marital status: Widowed  ?  Spouse name: Not on file  ? Number of children: Not on file  ? Years of education: Not on file  ? Highest education level: 10th grade  ?Occupational History  ? Occupation: retired  ?Tobacco Use  ? Smoking status: Every Day  ?  Packs/day: 2.00  ?  Years: 56.00  ?  Pack years: 112.00  ?  Types: Cigarettes  ? Smokeless tobacco: Never  ? Tobacco comments:  ?  has the gum  and patch.   ?Vaping Use  ? Vaping Use: Never used  ?Substance and Sexual Activity  ? Alcohol use: No  ? Drug use: No  ? Sexual activity: Never  ?Other Topics Concern  ? Not on file  ?Social History Narrative  ? Lives with grandson, granddaughter, and great-granddaughter, and dog Barth Kirks.  ? ?Social Determinants of Health  ? ?Financial Resource Strain: Low Risk   ? Difficulty of Paying Living Expenses: Not hard at all  ?Food Insecurity: No Food Insecurity  ? Worried About Charity fundraiser in the Last Year: Never true  ? Ran Out of Food in the Last Year: Never true  ?Transportation Needs: No Transportation Needs  ? Lack of Transportation (Medical): No  ? Lack of Transportation (Non-Medical): No  ?Physical Activity: Inactive  ? Days of Exercise per Week: 0 days  ? Minutes of Exercise per Session: 0 min  ?Stress: No Stress Concern Present  ? Feeling of Stress : Not at all  ?Social Connections: Socially Isolated  ? Frequency of Communication with Friends and Family: More than three times a week  ? Frequency of Social Gatherings with Friends and Family: Twice a week  ? Attends Religious Services: Never  ? Active Member of Clubs or Organizations: No  ? Attends Archivist Meetings: Never  ? Marital Status: Widowed  ? ? ?Tobacco Counseling ?Ready to quit: Not Answered ?Counseling given:  Not Answered ?Tobacco comments: has the gum and patch.  ? ? ?Clinical Intake: ? ?  ? ?  ? ?  ? ?  ? ?  ? ?Diabetic?  no ? ?  ? ?  ? ? ?Activities of Daily Living ? ?  05/31/2021  ?  1:00 PM 04/28/2021  ?  9

## 2021-05-31 NOTE — Patient Instructions (Addendum)
Ms. Senegal , ?Thank you for taking time to come for your Medicare Wellness Visit. I appreciate your ongoing commitment to your health goals. Please review the following plan we discussed and let me know if I can assist you in the future.  ? ?Screening recommendations/referrals: ?Colonoscopy: up to date ?Mammogram: up to date ?Bone Density: up to date ?Recommended yearly ophthalmology/optometry visit for glaucoma screening and checkup ?Recommended yearly dental visit for hygiene and checkup ? ?Vaccinations: ?Influenza vaccine: up to date ?Pneumococcal vaccine: up to date ?Tdap vaccine: up to date ?Shingles vaccine: Education provided    ? ? ?Conditions/risks identified:  ? ?Next appointment: 06-08-2021 @ 1:00 Wynetta Emery ? ? ?Preventive Care 73 Years and Older, Female ?Preventive care refers to lifestyle choices and visits with your health care provider that can promote health and wellness. ?What does preventive care include? ?A yearly physical exam. This is also called an annual well check. ?Dental exams once or twice a year. ?Routine eye exams. Ask your health care provider how often you should have your eyes checked. ?Personal lifestyle choices, including: ?Daily care of your teeth and gums. ?Regular physical activity. ?Eating a healthy diet. ?Avoiding tobacco and drug use. ?Limiting alcohol use. ?Practicing safe sex. ?Taking low-dose aspirin every day. ?Taking vitamin and mineral supplements as recommended by your health care provider. ?What happens during an annual well check? ?The services and screenings done by your health care provider during your annual well check will depend on your age, overall health, lifestyle risk factors, and family history of disease. ?Counseling  ?Your health care provider may ask you questions about your: ?Alcohol use. ?Tobacco use. ?Drug use. ?Emotional well-being. ?Home and relationship well-being. ?Sexual activity. ?Eating habits. ?History of falls. ?Memory and ability to understand  (cognition). ?Work and work Statistician. ?Reproductive health. ?Screening  ?You may have the following tests or measurements: ?Height, weight, and BMI. ?Blood pressure. ?Lipid and cholesterol levels. These may be checked every 5 years, or more frequently if you are over 45 years old. ?Skin check. ?Lung cancer screening. You may have this screening every year starting at age 73 if you have a 30-pack-year history of smoking and currently smoke or have quit within the past 15 years. ?Fecal occult blood test (FOBT) of the stool. You may have this test every year starting at age 73. ?Flexible sigmoidoscopy or colonoscopy. You may have a sigmoidoscopy every 5 years or a colonoscopy every 10 years starting at age 75. ?Hepatitis C blood test. ?Hepatitis B blood test. ?Sexually transmitted disease (STD) testing. ?Diabetes screening. This is done by checking your blood sugar (glucose) after you have not eaten for a while (fasting). You may have this done every 1-3 years. ?Bone density scan. This is done to screen for osteoporosis. You may have this done starting at age 30. ?Mammogram. This may be done every 1-2 years. Talk to your health care provider about how often you should have regular mammograms. ?Talk with your health care provider about your test results, treatment options, and if necessary, the need for more tests. ?Vaccines  ?Your health care provider may recommend certain vaccines, such as: ?Influenza vaccine. This is recommended every year. ?Tetanus, diphtheria, and acellular pertussis (Tdap, Td) vaccine. You may need a Td booster every 10 years. ?Zoster vaccine. You may need this after age 72. ?Pneumococcal 13-valent conjugate (PCV13) vaccine. One dose is recommended after age 73. ?Pneumococcal polysaccharide (PPSV23) vaccine. One dose is recommended after age 73. ?Talk to your health care provider about which screenings  and vaccines you need and how often you need them. ?This information is not intended to  replace advice given to you by your health care provider. Make sure you discuss any questions you have with your health care provider. ?Document Released: 03/20/2015 Document Revised: 11/11/2015 Document Reviewed: 12/23/2014 ?Elsevier Interactive Patient Education ? 2017 [place]. ? ?Fall Prevention in the Home ?Falls can cause injuries. They can happen to people of all ages. There are many things you can do to make your home safe and to help prevent falls. ?What can I do on the outside of my home? ?Regularly fix the edges of walkways and driveways and fix any cracks. ?Remove anything that might make you trip as you walk through a door, such as a raised step or threshold. ?Trim any bushes or trees on the path to your home. ?Use bright outdoor lighting. ?Clear any walking paths of anything that might make someone trip, such as rocks or tools. ?Regularly check to see if handrails are loose or broken. Make sure that both sides of any steps have handrails. ?Any raised decks and porches should have guardrails on the edges. ?Have any leaves, snow, or ice cleared regularly. ?Use sand or salt on walking paths during winter. ?Clean up any spills in your garage right away. This includes oil or grease spills. ?What can I do in the bathroom? ?Use night lights. ?Install grab bars by the toilet and in the tub and shower. Do not use towel bars as grab bars. ?Use non-skid mats or decals in the tub or shower. ?If you need to sit down in the shower, use a plastic, non-slip stool. ?Keep the floor dry. Clean up any water that spills on the floor as soon as it happens. ?Remove soap buildup in the tub or shower regularly. ?Attach bath mats securely with double-sided non-slip rug tape. ?Do not have throw rugs and other things on the floor that can make you trip. ?What can I do in the bedroom? ?Use night lights. ?Make sure that you have a light by your bed that is easy to reach. ?Do not use any sheets or blankets that are too big for  your bed. They should not hang down onto the floor. ?Have a firm chair that has side arms. You can use this for support while you get dressed. ?Do not have throw rugs and other things on the floor that can make you trip. ?What can I do in the kitchen? ?Clean up any spills right away. ?Avoid walking on wet floors. ?Keep items that you use a lot in easy-to-reach places. ?If you need to reach something above you, use a strong step stool that has a grab bar. ?Keep electrical cords out of the way. ?Do not use floor polish or wax that makes floors slippery. If you must use wax, use non-skid floor wax. ?Do not have throw rugs and other things on the floor that can make you trip. ?What can I do with my stairs? ?Do not leave any items on the stairs. ?Make sure that there are handrails on both sides of the stairs and use them. Fix handrails that are broken or loose. Make sure that handrails are as long as the stairways. ?Check any carpeting to make sure that it is firmly attached to the stairs. Fix any carpet that is loose or worn. ?Avoid having throw rugs at the top or bottom of the stairs. If you do have throw rugs, attach them to the floor with carpet tape. ?  Make sure that you have a light switch at the top of the stairs and the bottom of the stairs. If you do not have them, ask someone to add them for you. ?What else can I do to help prevent falls? ?Wear shoes that: ?Do not have high heels. ?Have rubber bottoms. ?Are comfortable and fit you well. ?Are closed at the toe. Do not wear sandals. ?If you use a stepladder: ?Make sure that it is fully opened. Do not climb a closed stepladder. ?Make sure that both sides of the stepladder are locked into place. ?Ask someone to hold it for you, if possible. ?Clearly mark and make sure that you can see: ?Any grab bars or handrails. ?First and last steps. ?Where the edge of each step is. ?Use tools that help you move around (mobility aids) if they are needed. These  include: ?Canes. ?Walkers. ?Scooters. ?Crutches. ?Turn on the lights when you go into a dark area. Replace any light bulbs as soon as they burn out. ?Set up your furniture so you have a clear path. Avoid moving your furniture

## 2021-06-01 DIAGNOSIS — I340 Nonrheumatic mitral (valve) insufficiency: Secondary | ICD-10-CM | POA: Diagnosis not present

## 2021-06-01 DIAGNOSIS — R079 Chest pain, unspecified: Secondary | ICD-10-CM | POA: Diagnosis not present

## 2021-06-01 DIAGNOSIS — I10 Essential (primary) hypertension: Secondary | ICD-10-CM | POA: Diagnosis not present

## 2021-06-01 DIAGNOSIS — I2510 Atherosclerotic heart disease of native coronary artery without angina pectoris: Secondary | ICD-10-CM | POA: Diagnosis not present

## 2021-06-01 DIAGNOSIS — E782 Mixed hyperlipidemia: Secondary | ICD-10-CM | POA: Diagnosis not present

## 2021-06-03 ENCOUNTER — Ambulatory Visit: Payer: Self-pay | Admitting: *Deleted

## 2021-06-03 DIAGNOSIS — Z20822 Contact with and (suspected) exposure to covid-19: Secondary | ICD-10-CM | POA: Diagnosis not present

## 2021-06-03 DIAGNOSIS — Z7951 Long term (current) use of inhaled steroids: Secondary | ICD-10-CM | POA: Diagnosis not present

## 2021-06-03 DIAGNOSIS — Z7982 Long term (current) use of aspirin: Secondary | ICD-10-CM | POA: Diagnosis not present

## 2021-06-03 DIAGNOSIS — Z792 Long term (current) use of antibiotics: Secondary | ICD-10-CM | POA: Diagnosis not present

## 2021-06-03 DIAGNOSIS — R112 Nausea with vomiting, unspecified: Secondary | ICD-10-CM | POA: Diagnosis not present

## 2021-06-03 DIAGNOSIS — R5382 Chronic fatigue, unspecified: Secondary | ICD-10-CM | POA: Diagnosis not present

## 2021-06-03 DIAGNOSIS — J440 Chronic obstructive pulmonary disease with acute lower respiratory infection: Secondary | ICD-10-CM | POA: Diagnosis not present

## 2021-06-03 DIAGNOSIS — J189 Pneumonia, unspecified organism: Secondary | ICD-10-CM | POA: Diagnosis not present

## 2021-06-03 DIAGNOSIS — R0902 Hypoxemia: Secondary | ICD-10-CM | POA: Diagnosis not present

## 2021-06-03 DIAGNOSIS — I2510 Atherosclerotic heart disease of native coronary artery without angina pectoris: Secondary | ICD-10-CM | POA: Diagnosis not present

## 2021-06-03 DIAGNOSIS — J9811 Atelectasis: Secondary | ICD-10-CM | POA: Diagnosis not present

## 2021-06-03 DIAGNOSIS — K219 Gastro-esophageal reflux disease without esophagitis: Secondary | ICD-10-CM | POA: Diagnosis not present

## 2021-06-03 DIAGNOSIS — Z79899 Other long term (current) drug therapy: Secondary | ICD-10-CM | POA: Diagnosis not present

## 2021-06-03 DIAGNOSIS — R5383 Other fatigue: Secondary | ICD-10-CM | POA: Diagnosis not present

## 2021-06-03 DIAGNOSIS — J449 Chronic obstructive pulmonary disease, unspecified: Secondary | ICD-10-CM | POA: Diagnosis not present

## 2021-06-03 DIAGNOSIS — Z882 Allergy status to sulfonamides status: Secondary | ICD-10-CM | POA: Diagnosis not present

## 2021-06-03 DIAGNOSIS — Z7902 Long term (current) use of antithrombotics/antiplatelets: Secondary | ICD-10-CM | POA: Diagnosis not present

## 2021-06-03 DIAGNOSIS — Z886 Allergy status to analgesic agent status: Secondary | ICD-10-CM | POA: Diagnosis not present

## 2021-06-03 DIAGNOSIS — E871 Hypo-osmolality and hyponatremia: Secondary | ICD-10-CM | POA: Diagnosis not present

## 2021-06-03 DIAGNOSIS — F17210 Nicotine dependence, cigarettes, uncomplicated: Secondary | ICD-10-CM | POA: Diagnosis not present

## 2021-06-03 DIAGNOSIS — I10 Essential (primary) hypertension: Secondary | ICD-10-CM | POA: Diagnosis not present

## 2021-06-03 DIAGNOSIS — Z881 Allergy status to other antibiotic agents status: Secondary | ICD-10-CM | POA: Diagnosis not present

## 2021-06-03 DIAGNOSIS — Z9981 Dependence on supplemental oxygen: Secondary | ICD-10-CM | POA: Diagnosis not present

## 2021-06-03 DIAGNOSIS — J90 Pleural effusion, not elsewhere classified: Secondary | ICD-10-CM | POA: Diagnosis not present

## 2021-06-03 DIAGNOSIS — Z66 Do not resuscitate: Secondary | ICD-10-CM | POA: Diagnosis not present

## 2021-06-03 DIAGNOSIS — Z6821 Body mass index (BMI) 21.0-21.9, adult: Secondary | ICD-10-CM | POA: Diagnosis not present

## 2021-06-03 DIAGNOSIS — Z880 Allergy status to penicillin: Secondary | ICD-10-CM | POA: Diagnosis not present

## 2021-06-03 DIAGNOSIS — R918 Other nonspecific abnormal finding of lung field: Secondary | ICD-10-CM | POA: Diagnosis not present

## 2021-06-03 NOTE — Telephone Encounter (Signed)
?  Chief Complaint: Weakness ?Symptoms: Multiple: Weakness,fever 100.0 this AM, intermittent vomiting immediately after eating "Not always" congestion yesterday (Covid neg yesterday) not urinating as much, pain under left rib area "But seems a little better." No energy, Heart doctor last week said she was wheezing, HR "Fast" ?Frequency: Weakness Sunday ?Pertinent Negatives: Patient denies  ? ?Disposition: '[]'$ ED /'[]'$ Urgent Care (no appt availability in office) / '[]'$ Appointment(In office/virtual)/ '[]'$  [facility]/ '[]'$ Home Care/ '[]'$ Refused Recommended Disposition /'[]'$ [facility]/ '[x]'$  Follow-up with PCP ?Additional Notes: Pt states stopped taking Gabapentin, last took dose Sunday. ?Consulted with Ubaldo Glassing, routing to practice for PCPs review and final disposition. Please advise ?Reason for Disposition ? [1] MODERATE weakness (i.e., interferes with work, school, normal activities) AND [2] persists > 3 days ? ?Answer Assessment - Initial Assessment Questions ?1. DESCRIPTION: "Describe how you are feeling." ?    No energy, no appetite, try to eat but comes up most of time ?2. SEVERITY: "How bad is it?"  "Can you stand and walk?" ?  - MILD - Feels weak or tired, but does not interfere with work, school or normal activities ?  - MODERATE - Able to stand and walk; weakness interferes with work, school, or normal activities ?  - SEVERE - Unable to stand or walk ?    Moderate ?3. ONSET:  "When did the weakness begin?" ?    Sunday ?4. CAUSE: "What do you think is causing the weakness?" ?    Unsure ?5. MEDICINES: "Have you recently started a new medicine or had a change in the amount of a medicine?" ?    Stopped Gabapentin, last took Sunday night ?6. OTHER SYMPTOMS: "Do you have any other symptoms?" (e.g., chest pain, fever, cough, SOB, vomiting, diarrhea, bleeding, other areas of pain) ?    Vomiting at times, gassy,100.0 fever this AM, not urinating as much, "Little darker" congested yesterday, covid neg yesterday,  "Been hurting under left ribs, but not as bad." ? ?Protocols used: Weakness (Generalized) and Fatigue-A-AH ? ?

## 2021-06-03 NOTE — Telephone Encounter (Signed)
Routing to provider  

## 2021-06-04 DIAGNOSIS — R5383 Other fatigue: Secondary | ICD-10-CM | POA: Diagnosis not present

## 2021-06-04 DIAGNOSIS — Z9981 Dependence on supplemental oxygen: Secondary | ICD-10-CM | POA: Diagnosis not present

## 2021-06-04 DIAGNOSIS — R0902 Hypoxemia: Secondary | ICD-10-CM | POA: Diagnosis not present

## 2021-06-04 DIAGNOSIS — Z66 Do not resuscitate: Secondary | ICD-10-CM | POA: Diagnosis not present

## 2021-06-04 DIAGNOSIS — J449 Chronic obstructive pulmonary disease, unspecified: Secondary | ICD-10-CM | POA: Diagnosis not present

## 2021-06-04 DIAGNOSIS — R112 Nausea with vomiting, unspecified: Secondary | ICD-10-CM | POA: Diagnosis not present

## 2021-06-04 DIAGNOSIS — Z792 Long term (current) use of antibiotics: Secondary | ICD-10-CM | POA: Diagnosis not present

## 2021-06-04 DIAGNOSIS — Z6821 Body mass index (BMI) 21.0-21.9, adult: Secondary | ICD-10-CM | POA: Diagnosis not present

## 2021-06-04 DIAGNOSIS — Z79899 Other long term (current) drug therapy: Secondary | ICD-10-CM | POA: Diagnosis not present

## 2021-06-04 DIAGNOSIS — F17210 Nicotine dependence, cigarettes, uncomplicated: Secondary | ICD-10-CM | POA: Diagnosis not present

## 2021-06-04 DIAGNOSIS — E871 Hypo-osmolality and hyponatremia: Secondary | ICD-10-CM | POA: Diagnosis not present

## 2021-06-04 DIAGNOSIS — J189 Pneumonia, unspecified organism: Secondary | ICD-10-CM | POA: Diagnosis not present

## 2021-06-06 DIAGNOSIS — J189 Pneumonia, unspecified organism: Secondary | ICD-10-CM | POA: Diagnosis not present

## 2021-06-07 ENCOUNTER — Telehealth: Payer: Self-pay | Admitting: *Deleted

## 2021-06-07 ENCOUNTER — Ambulatory Visit
Admission: RE | Admit: 2021-06-07 | Discharge: 2021-06-07 | Disposition: A | Discharge status: Home / Self Care | Payer: Medicare Other | Source: Ambulatory Visit | Attending: Specialist | Admitting: Specialist

## 2021-06-07 DIAGNOSIS — J849 Interstitial pulmonary disease, unspecified: Secondary | ICD-10-CM | POA: Diagnosis not present

## 2021-06-07 DIAGNOSIS — J432 Centrilobular emphysema: Secondary | ICD-10-CM | POA: Diagnosis not present

## 2021-06-07 DIAGNOSIS — R918 Other nonspecific abnormal finding of lung field: Secondary | ICD-10-CM | POA: Diagnosis not present

## 2021-06-07 NOTE — Telephone Encounter (Signed)
Transition Care Management Unsuccessful Follow-up Telephone Call ? ?Date of discharge and from where:  [facility]30 ? ?Attempts:  1st Attempt ? ?Reason for unsuccessful TCM follow-up call:  No answer/busy ? ?  ?

## 2021-06-08 ENCOUNTER — Ambulatory Visit (INDEPENDENT_AMBULATORY_CARE_PROVIDER_SITE_OTHER): Payer: Medicare Other | Admitting: Family Medicine

## 2021-06-08 ENCOUNTER — Encounter: Payer: Self-pay | Admitting: Family Medicine

## 2021-06-08 VITALS — BP 129/64 | HR 91 | Temp 98.2°F | Wt 103.0 lb

## 2021-06-08 DIAGNOSIS — E871 Hypo-osmolality and hyponatremia: Secondary | ICD-10-CM | POA: Diagnosis not present

## 2021-06-08 DIAGNOSIS — J189 Pneumonia, unspecified organism: Secondary | ICD-10-CM | POA: Diagnosis not present

## 2021-06-08 DIAGNOSIS — Z720 Tobacco use: Secondary | ICD-10-CM

## 2021-06-08 DIAGNOSIS — E876 Hypokalemia: Secondary | ICD-10-CM

## 2021-06-08 DIAGNOSIS — J449 Chronic obstructive pulmonary disease, unspecified: Secondary | ICD-10-CM

## 2021-06-08 NOTE — Progress Notes (Signed)
? ?BP 129/64   Pulse 91   Temp 98.2 ?F (36.8 ?C)   Wt 103 lb (46.7 kg)   LMP  (LMP Unknown)   SpO2 95%   BMI 20.80 kg/m?   ? ?Subjective:  ? ? Patient ID: Kristi Clark, female    DOB: 1950-03-28, 71 y.o.   MRN: [id number] ? ?HPI: ?Kristi Clark is a 71 y.o. female ? ?Chief Complaint  ?Patient presents with  ? Hospitalization Follow-up  ?  Patient here to follow up on recent admission to hospital for PNA. Patient states she feels better today, is on her last day of antibiotics.   ? ?Transition of Care Hospital Follow up.  ? ?Hospital/Facility: [facility] ?D/C Physician: Dr. Eber Jones ?D/C Date: 06/04/21 ? ?Records Requested: 06/08/21 ?Records Received: 06/08/21 ?Records Reviewed: 06/08/21 ? ?Diagnoses on Discharge: Pneumonia ? ?Date of interactive Contact within 48 hours of discharge: 06/07/21 ?Contact was through: phone ? ?Date of 7 day or 14 day face-to-face visit: 06/08/21   within 7 days ? ?Outpatient Encounter Medications as of 06/08/2021  ?Medication Sig Note  ? albuterol (VENTOLIN HFA) 108 (90 Base) MCG/ACT inhaler Inhale 2 puffs into the lungs every 6 (six) hours as needed for wheezing or shortness of breath.   ? aspirin 81 MG tablet Take 81 mg by mouth daily.   ? atorvastatin (LIPITOR) 40 MG tablet Take 1 tablet (40 mg total) by mouth daily.   ? Blood Pressure Monitoring (BLOOD PRESSURE MONITOR/WRIST) KIT Take Blood pressure as needed, Dx: I12.9   ? Calcium Carbonate (CALCIUM 600 PO) Take by mouth daily.   ? cefdinir (OMNICEF) 300 MG capsule Take 300 mg by mouth every 12 (twelve) hours.   ? Cetirizine HCl (ZYRTEC PO) Take by mouth daily.   ? clopidogrel (PLAVIX) 75 MG tablet Take 1 tablet (75 mg total) by mouth daily.   ? diclofenac Sodium (VOLTAREN) 1 % GEL Apply 4 g topically 4 (four) times daily.   ? fluticasone (FLONASE) 50 MCG/ACT nasal spray Place 2 sprays into both nostrils daily.   ? fluticasone-salmeterol (ADVAIR DISKUS) 250-50 MCG/ACT AEPB Inhale 1 puff into the lungs in the morning and  at bedtime.   ? hydrALAZINE (APRESOLINE) 25 MG tablet Take 1 tablet (25 mg total) by mouth 2 (two) times daily.   ? isosorbide mononitrate (IMDUR) 60 MG 24 hr tablet Take 1 tablet (60 mg total) by mouth daily.   ? losartan (COZAAR) 100 MG tablet Take 1 tablet (100 mg total) by mouth daily.   ? metoprolol succinate (TOPROL-XL) 50 MG 24 hr tablet Take 1 tablet (50 mg total) by mouth daily. Take with or immediately following a meal.   ? Multiple Vitamins-Minerals (CENTRUM SILVER ADULT 50+ PO) Take 1 tablet by mouth daily.   ? Multiple Vitamins-Minerals (PRESERVISION AREDS 2+MULTI VIT) CAPS Take 1 capsule by mouth in the morning and at bedtime.   ? nystatin (MYCOSTATIN) 100000 UNIT/ML suspension Take 5 mLs (500,000 Units total) by mouth 4 (four) times daily.   ? ondansetron (ZOFRAN) 4 MG tablet Take 1 tablet (4 mg total) by mouth every 8 (eight) hours as needed for nausea or vomiting.   ? pantoprazole (PROTONIX) 40 MG tablet Take 1 tablet (40 mg total) by mouth 2 (two) times daily. 06/08/2021: Once daily  ? polyethylene glycol powder (GLYCOLAX/MIRALAX) 17 GM/SCOOP powder Take 17 g by mouth daily as needed for moderate constipation.   ? Probiotic Product (PROBIOTIC-10 PO) Take by mouth. Every other day   ?  spironolactone (ALDACTONE) 25 MG tablet Take 12.5 mg by mouth daily.   ? tiotropium (SPIRIVA) 18 MCG inhalation capsule Place 1 capsule (18 mcg total) into inhaler and inhale daily.   ? sucralfate (CARAFATE) 1 g tablet Take 1 tablet (1 g total) by mouth 4 (four) times daily -  with meals and at bedtime. (Patient not taking: Reported on 06/08/2021)   ? [DISCONTINUED] gabapentin (NEURONTIN) 100 MG capsule Take 1 capsule (100 mg total) by mouth at bedtime. (Patient not taking: Reported on 06/08/2021)   ? [DISCONTINUED] triamcinolone cream (KENALOG) 0.1 % Apply 1 application topically 2 (two) times daily. (Patient not taking: Reported on 06/08/2021)   ? ?No facility-administered encounter medications on file as of 06/08/2021.  ?Per  Hospitalist: "Hypoxia  Presumed CAP: Briefly febrile overnight. Intermittently still requiring 2L O2 after ambulation to restroom. Breathing & exam much improved. Initially given ceftriaxone & azithromycin before transition to oral cefdinir. ? ?On discharge, continue azithromycin (3/30-4/3) & cefdinir. ?Blood & respiratory cultures no growth at the time if discharge ?  ?## Hyponatremia  Intractable Nausea/Vomiting  Weakness and Fatigue ?Na 125 on admission likely 2/2 poor nutrition, 'tea and toast' diet. Improved to 129 with IVF overnight. Tolerated dinner and breakfast without nausea. Obstruction considered (though considered unlikely), but KUB reassuring. Recheck BMP as outpatient ?  ?## Severe COPD ?Uses Advair & Spiriva daily, rarely needs albuterol inhaler. Currently requiring 2L [place] to maintain sats 88-92. Ordered home oxygen which would be beneficial to her life, prevent re-hospitalizations.  ?  ?##Tobacco Use ?1.5pack per day for 60 years ? - No interest in quitting ? - CT scan scheduled 4/4 to follow known pulmonary nodules ?  ?Code Status: DNR and DNI ?  ?Disposition: As she is clinically improved, home today with home oxygen."  ? ?Diagnostic Tests Reviewed: XR Chest 2 views ? ?Result Date: 06/03/2021 ?EXAM: XR CHEST 2 VIEWS DATE: 06/03/2021 3:44 PM ACCESSION: [id number] [facility] DICTATED: 06/03/2021 4:28 PM INTERPRETATION LOCATION: [place]: 71 years old Female with COUGH TECHNIQUE: PA and Lateral Chest Radiographs. COMPARISON: Chest 06/07/2010 FINDINGS: No consolidation. Right basilar infiltrate/atelectasis. Diffuse interstitial scarring.. Trace right effusion. No left pleural effusion. No pneumothorax. Normal cardiomediastinal silhouette.  ? ?Right basilar infiltrate/atelectasis with trace right effusion. Chronic interstitial changes.. ? ?XR Abdomen 1 View ? ?Result Date: 06/04/2021 ?EXAM: XR ABDOMEN 1 VIEW DATE: 06/03/2021 9:26 PM ACCESSION: [id number] [facility] DICTATED: 06/04/2021 8:17 AM  INTERPRETATION LOCATION: [place] CLINICAL INDICATION: 71 years old Female with NAUSEA & VOMITING COMPARISON: None. TECHNIQUE: Supine view of the abdomen. FINDINGS: Right upper quadrant surgical clips. Mild to moderate volume stool burden. Small bowel is not distended. No obstructive bowel pattern. No acute osseous abnormality. No soft tissue masses. Visualized lungs are clear.  ? ?Nonobstructed bowel gas pattern. ? ?Disposition: Home ? ?Consults: None ? ?Discharge Instructions:  ?BMP to recheck sodium ?Oxygen levels  ?Follow up blood cultures that were pending on discharge ?Review medications ? ?Disease/illness Education: Discussed today ? ?Home Health/Community Services Discussions/Referrals: In place ? ?Establishment or re-establishment of referral orders for community resources: In place ? ?Discussion with other health care providers: N/A ? ?Assessment and Support of treatment regimen adherence: Good ? ?Appointments Coordinated with: Patient and granddaughter ? ?Education for self-management, independent living, and ADLs: Discussed today ? ?Since getting out of the hospital, she has been feeling better. Feeling tired. No fevers. Some cough. Not using the oxygen because she hasn't felt like she needed it. She has had some chest pain that has continued  as is the SOB. She notes that Dr. Humphrey Rolls wants to do a stress test next week. She has not been dizzy, no passing out. No other concerns or complaints at this time.  ? ?SMOKING CESSATION ?Smoking Status: current every day smoker ?Smoking Amount: 1-1.5ppd ?Smoking Onset: 71yo ?Smoking Quit Date: not set ?Smoking triggers: stress ?Type of tobacco use: cigarettes ?Children in the house: yes ?Other household members who smoke: no ?Treatments attempted: chantix ?Pneumovax: up to date ? ? ?Relevant past medical, surgical, family and social history reviewed and updated as indicated. Interim medical history since our last visit reviewed. ?Allergies and medications reviewed  and updated. ? ?Review of Systems  ?Constitutional: Negative.   ?HENT: Negative.    ?Respiratory:  Positive for cough. Negative for apnea, choking, chest tightness, shortness of breath, wheezing and stridor.   ?Card

## 2021-06-09 LAB — BASIC METABOLIC PANEL
BUN/Creatinine Ratio: 13 (ref 12–28)
BUN: 11 mg/dL (ref 8–27)
CO2: 23 mmol/L (ref 20–29)
Calcium: 9.7 mg/dL (ref 8.7–10.3)
Chloride: 97 mmol/L (ref 96–106)
Creatinine, Ser: 0.85 mg/dL (ref 0.57–1.00)
Glucose: 95 mg/dL (ref 70–99)
Potassium: 4.5 mmol/L (ref 3.5–5.2)
Sodium: 135 mmol/L (ref 134–144)
eGFR: 73 mL/min/{1.73_m2} (ref >59)

## 2021-06-15 ENCOUNTER — Encounter: Payer: Self-pay | Admitting: Family Medicine

## 2021-06-15 NOTE — Assessment & Plan Note (Signed)
Lungs clear today. Oxygen level good at 95%. Continue to follow with pulmonology. Continue inhalers. Call with any concerns. Continue to monitor.  ?

## 2021-06-15 NOTE — Assessment & Plan Note (Signed)
Rechecking labs today. Await results. Treat as needed.  °

## 2021-06-15 NOTE — Assessment & Plan Note (Signed)
Discussed options on quitting including chantix, wellbutrin, nicotine replacement. She is not interested in chantix or wellbutrin. She doesn't want a nicotine replacement. Will work on cutting down. Continue to follow with pulmonology. To have follow up CT later this week.  ?

## 2021-06-16 DIAGNOSIS — I2510 Atherosclerotic heart disease of native coronary artery without angina pectoris: Secondary | ICD-10-CM | POA: Diagnosis not present

## 2021-06-16 DIAGNOSIS — J449 Chronic obstructive pulmonary disease, unspecified: Secondary | ICD-10-CM | POA: Diagnosis not present

## 2021-06-16 DIAGNOSIS — I340 Nonrheumatic mitral (valve) insufficiency: Secondary | ICD-10-CM | POA: Diagnosis not present

## 2021-06-16 DIAGNOSIS — I10 Essential (primary) hypertension: Secondary | ICD-10-CM | POA: Diagnosis not present

## 2021-06-16 DIAGNOSIS — Z9861 Coronary angioplasty status: Secondary | ICD-10-CM | POA: Diagnosis not present

## 2021-06-16 DIAGNOSIS — R079 Chest pain, unspecified: Secondary | ICD-10-CM | POA: Diagnosis not present

## 2021-06-16 DIAGNOSIS — E782 Mixed hyperlipidemia: Secondary | ICD-10-CM | POA: Diagnosis not present

## 2021-06-17 ENCOUNTER — Encounter: Payer: Self-pay | Admitting: Family Medicine

## 2021-06-18 DIAGNOSIS — R9389 Abnormal findings on diagnostic imaging of other specified body structures: Secondary | ICD-10-CM | POA: Diagnosis not present

## 2021-06-18 DIAGNOSIS — R0609 Other forms of dyspnea: Secondary | ICD-10-CM | POA: Diagnosis not present

## 2021-06-18 DIAGNOSIS — R052 Subacute cough: Secondary | ICD-10-CM | POA: Diagnosis not present

## 2021-06-18 DIAGNOSIS — Z720 Tobacco use: Secondary | ICD-10-CM | POA: Diagnosis not present

## 2021-06-18 DIAGNOSIS — J301 Allergic rhinitis due to pollen: Secondary | ICD-10-CM | POA: Diagnosis not present

## 2021-06-18 DIAGNOSIS — J439 Emphysema, unspecified: Secondary | ICD-10-CM | POA: Diagnosis not present

## 2021-06-18 DIAGNOSIS — R918 Other nonspecific abnormal finding of lung field: Secondary | ICD-10-CM | POA: Diagnosis not present

## 2021-06-18 DIAGNOSIS — J849 Interstitial pulmonary disease, unspecified: Secondary | ICD-10-CM | POA: Diagnosis not present

## 2021-06-21 DIAGNOSIS — I2510 Atherosclerotic heart disease of native coronary artery without angina pectoris: Secondary | ICD-10-CM | POA: Diagnosis not present

## 2021-06-21 DIAGNOSIS — Z9861 Coronary angioplasty status: Secondary | ICD-10-CM | POA: Diagnosis not present

## 2021-06-21 DIAGNOSIS — R079 Chest pain, unspecified: Secondary | ICD-10-CM | POA: Diagnosis not present

## 2021-06-21 DIAGNOSIS — E782 Mixed hyperlipidemia: Secondary | ICD-10-CM | POA: Diagnosis not present

## 2021-06-21 DIAGNOSIS — I340 Nonrheumatic mitral (valve) insufficiency: Secondary | ICD-10-CM | POA: Diagnosis not present

## 2021-06-21 DIAGNOSIS — J449 Chronic obstructive pulmonary disease, unspecified: Secondary | ICD-10-CM | POA: Diagnosis not present

## 2021-06-21 DIAGNOSIS — I10 Essential (primary) hypertension: Secondary | ICD-10-CM | POA: Diagnosis not present

## 2021-06-22 ENCOUNTER — Other Ambulatory Visit: Payer: Self-pay | Admitting: Specialist

## 2021-06-22 DIAGNOSIS — R918 Other nonspecific abnormal finding of lung field: Secondary | ICD-10-CM

## 2021-06-22 DIAGNOSIS — J849 Interstitial pulmonary disease, unspecified: Secondary | ICD-10-CM

## 2021-06-22 DIAGNOSIS — R9389 Abnormal findings on diagnostic imaging of other specified body structures: Secondary | ICD-10-CM

## 2021-06-23 ENCOUNTER — Encounter: Payer: Self-pay | Admitting: Family Medicine

## 2021-07-05 DIAGNOSIS — I340 Nonrheumatic mitral (valve) insufficiency: Secondary | ICD-10-CM | POA: Diagnosis not present

## 2021-07-05 DIAGNOSIS — E782 Mixed hyperlipidemia: Secondary | ICD-10-CM | POA: Diagnosis not present

## 2021-07-05 DIAGNOSIS — Z9861 Coronary angioplasty status: Secondary | ICD-10-CM | POA: Diagnosis not present

## 2021-07-05 DIAGNOSIS — I10 Essential (primary) hypertension: Secondary | ICD-10-CM | POA: Diagnosis not present

## 2021-07-05 DIAGNOSIS — J449 Chronic obstructive pulmonary disease, unspecified: Secondary | ICD-10-CM | POA: Diagnosis not present

## 2021-07-05 DIAGNOSIS — R079 Chest pain, unspecified: Secondary | ICD-10-CM | POA: Diagnosis not present

## 2021-07-05 DIAGNOSIS — I2510 Atherosclerotic heart disease of native coronary artery without angina pectoris: Secondary | ICD-10-CM | POA: Diagnosis not present

## 2021-07-09 ENCOUNTER — Ambulatory Visit (INDEPENDENT_AMBULATORY_CARE_PROVIDER_SITE_OTHER): Payer: Medicare Other | Admitting: Family Medicine

## 2021-07-09 ENCOUNTER — Encounter: Payer: Self-pay | Admitting: Family Medicine

## 2021-07-09 VITALS — BP 144/57 | HR 64 | Temp 97.8°F | Wt 97.4 lb

## 2021-07-09 DIAGNOSIS — I129 Hypertensive chronic kidney disease with stage 1 through stage 4 chronic kidney disease, or unspecified chronic kidney disease: Secondary | ICD-10-CM | POA: Diagnosis not present

## 2021-07-09 DIAGNOSIS — E785 Hyperlipidemia, unspecified: Secondary | ICD-10-CM

## 2021-07-09 DIAGNOSIS — I700 Atherosclerosis of aorta: Secondary | ICD-10-CM

## 2021-07-09 DIAGNOSIS — R634 Abnormal weight loss: Secondary | ICD-10-CM

## 2021-07-09 DIAGNOSIS — N182 Chronic kidney disease, stage 2 (mild): Secondary | ICD-10-CM

## 2021-07-09 LAB — URINALYSIS, ROUTINE W REFLEX MICROSCOPIC
Bilirubin, UA: NEGATIVE
Glucose, UA: NEGATIVE
Ketones, UA: NEGATIVE
Nitrite, UA: NEGATIVE
Protein,UA: NEGATIVE
RBC, UA: NEGATIVE
Specific Gravity, UA: 1.02 (ref 1.005–1.030)
Urobilinogen, Ur: 0.2 mg/dL (ref 0.2–1.0)
pH, UA: 6.5 (ref 5.0–7.5)

## 2021-07-09 LAB — MICROSCOPIC EXAMINATION: Epithelial Cells (non renal): >10 /hpf — AB (ref 0–10)

## 2021-07-09 LAB — MICROALBUMIN, URINE WAIVED
Creatinine, Urine Waived: 200 mg/dL (ref 10–300)
Microalb, Ur Waived: 80 mg/L — ABNORMAL HIGH (ref 0–19)
Microalb/Creat Ratio: <30 mg/g (ref <30)

## 2021-07-09 NOTE — Assessment & Plan Note (Signed)
Under good control on current regimen. Continue current regimen. Continue to monitor. Call with any concerns. Refills given. Labs drawn today.   

## 2021-07-09 NOTE — Assessment & Plan Note (Signed)
Rechecking labs today. Await results. Treat as needed.  °

## 2021-07-09 NOTE — Progress Notes (Signed)
? ?BP (!) 144/57   Pulse 64   Temp 97.8 ?F (36.6 ?C)   Wt 97 lb 6.4 oz (44.2 kg)   LMP  (LMP Unknown)   SpO2 96%   BMI 19.67 kg/m?   ? ?Subjective:  ? ? Patient ID: Kristi Clark, female    DOB: 04/08/1950, 71 y.o.   MRN: [id number] ? ?HPI: ?Kristi Clark is a 71 y.o. female ? ?Chief Complaint  ?Patient presents with  ? Hypertension  ? Chronic Kidney Disease  ? Hyperlipidemia  ? ?HYPERTENSION / HYPERLIPIDEMIA ?Satisfied with current treatment? yes- did not take her medicine this AM ?Duration of hypertension: chronic ?BP monitoring frequency: not checking ?BP medication side effects: no ?Duration of hyperlipidemia: chronic ?Cholesterol medication side effects: no ?Cholesterol supplements: none ?Past cholesterol medications: atorvastatin ?Medication compliance: excellent compliance ?Aspirin: yes ?Recent stressors: no ?Recurrent headaches: no ?Visual changes: no ?Palpitations: no ?Dyspnea: no ?Chest pain: no ?Lower extremity edema: no ?Dizzy/lightheaded: no ? ?Relevant past medical, surgical, family and social history reviewed and updated as indicated. Interim medical history since our last visit reviewed. ?Allergies and medications reviewed and updated. ? ?Review of Systems  ?Constitutional:  Positive for unexpected weight change. Negative for activity change, appetite change, chills, diaphoresis, fatigue and fever.  ?Respiratory: Negative.    ?Cardiovascular: Negative.   ?Gastrointestinal: Negative.   ?Musculoskeletal: Negative.   ?Neurological: Negative.   ?Psychiatric/Behavioral: Negative.    ? ?Per HPI unless specifically indicated above ? ?   ?Objective:  ?  ?BP (!) 144/57   Pulse 64   Temp 97.8 ?F (36.6 ?C)   Wt 97 lb 6.4 oz (44.2 kg)   LMP  (LMP Unknown)   SpO2 96%   BMI 19.67 kg/m?   ?Wt Readings from Last 3 Encounters:  ?07/09/21 97 lb 6.4 oz (44.2 kg)  ?06/08/21 103 lb (46.7 kg)  ?05/28/21 101 lb 9.6 oz (46.1 kg)  ?  ?Physical Exam ?Vitals and nursing note reviewed.  ?Constitutional:   ?    General: She is not in acute distress. ?   Appearance: Normal appearance. She is not ill-appearing, toxic-appearing or diaphoretic.  ?HENT:  ?   Head: Normocephalic and atraumatic.  ?   Right Ear: External ear normal.  ?   Left Ear: External ear normal.  ?   Nose: Nose normal.  ?   Mouth/Throat:  ?   Mouth: Mucous membranes are moist.  ?   Pharynx: Oropharynx is clear.  ?Eyes:  ?   General: No scleral icterus.    ?   Right eye: No discharge.     ?   Left eye: No discharge.  ?   Extraocular Movements: Extraocular movements intact.  ?   Conjunctiva/sclera: Conjunctivae normal.  ?   Pupils: Pupils are equal, round, and reactive to light.  ?Cardiovascular:  ?   Rate and Rhythm: Normal rate and regular rhythm.  ?   Pulses: Normal pulses.  ?   Heart sounds: Normal heart sounds. No murmur heard. ?  No friction rub. No gallop.  ?Pulmonary:  ?   Effort: Pulmonary effort is normal. No respiratory distress.  ?   Breath sounds: Normal breath sounds. No stridor. No wheezing, rhonchi or rales.  ?Chest:  ?   Chest wall: No tenderness.  ?Musculoskeletal:     ?   General: Normal range of motion.  ?   Cervical back: Normal range of motion and neck supple.  ?Skin: ?   General: Skin is warm and dry.  ?  Capillary Refill: Capillary refill takes less than 2 seconds.  ?   Coloration: Skin is not jaundiced or pale.  ?   Findings: No bruising, erythema, lesion or rash.  ?Neurological:  ?   General: No focal deficit present.  ?   Mental Status: She is alert and oriented to person, place, and time. Mental status is at baseline.  ?Psychiatric:     ?   Mood and Affect: Mood normal.     ?   Behavior: Behavior normal.     ?   Thought Content: Thought content normal.     ?   Judgment: Judgment normal.  ? ? ?Results for orders placed or performed in visit on 06/08/21  ?Basic metabolic panel  ?Result Value Ref Range  ? Glucose 95 70 - 99 mg/dL  ? BUN 11 8 - 27 mg/dL  ? Creatinine, Ser 0.85 0.57 - 1.00 mg/dL  ? eGFR 73 >59 mL/min/1.73  ?  BUN/Creatinine Ratio 13 12 - 28  ? Sodium 135 134 - 144 mmol/L  ? Potassium 4.5 3.5 - 5.2 mmol/L  ? Chloride 97 96 - 106 mmol/L  ? CO2 23 20 - 29 mmol/L  ? Calcium 9.7 8.7 - 10.3 mg/dL  ? ?   ?Assessment & Plan:  ? ?Problem List Items Addressed This Visit   ? ?  ? Cardiovascular and Mediastinum  ? Benign hypertension with chronic kidney disease - Primary  ?  Under good control on current regimen. Continue current regimen. Continue to monitor. Call with any concerns. Refills given. Labs drawn today.  ? ? ?  ?  ? Relevant Orders  ? Comprehensive metabolic panel  ? TSH  ? Microalbumin, Urine Waived  ? Aortic atherosclerosis ([place])  ?  Will keep BP and cholesterol under good control. Continue current regimen. Continue to monitor. Call with any concerns.  ? ?  ?  ? Relevant Orders  ? Comprehensive metabolic panel  ?  ? Genitourinary  ? CKD (chronic kidney disease), stage II  ?  Rechecking labs today. Await results. Treat as needed.  ? ?  ?  ? Relevant Orders  ? Comprehensive metabolic panel  ? CBC with Differential/Platelet  ? UA/M w/rflx Culture, Routine  ? Urinalysis, Routine w reflex microscopic  ?  ? Other  ? Hyperlipidemia  ?  Under good control on current regimen. Continue current regimen. Continue to monitor. Call with any concerns. Refills given. Labs drawn today. ? ? ?  ?  ? Relevant Orders  ? Comprehensive metabolic panel  ? Lipid Panel w/o Chol/HDL Ratio  ? ?Other Visit Diagnoses   ? ? Weight loss      ? Will check labs and start boost in the AM. Recheck 1 month.  ? ?  ?  ? ?Follow up plan: ?Return in about 4 weeks (around 08/06/2021) for follow up weight. ? ? ? ? ? ?

## 2021-07-09 NOTE — Assessment & Plan Note (Signed)
Will keep BP and cholesterol under good control. Continue current regimen. Continue to monitor. Call with any concerns.  

## 2021-07-10 LAB — TSH: TSH: 1.8 u[IU]/mL (ref 0.450–4.500)

## 2021-07-10 LAB — COMPREHENSIVE METABOLIC PANEL
ALT: 28 IU/L (ref 0–32)
AST: 38 IU/L (ref 0–40)
Albumin/Globulin Ratio: 1.7 (ref 1.2–2.2)
Albumin: 4.3 g/dL (ref 3.7–4.7)
Alkaline Phosphatase: 92 IU/L (ref 44–121)
BUN/Creatinine Ratio: 9 — ABNORMAL LOW (ref 12–28)
BUN: 9 mg/dL (ref 8–27)
Bilirubin Total: 0.3 mg/dL (ref 0.0–1.2)
CO2: 24 mmol/L (ref 20–29)
Calcium: 10.8 mg/dL — ABNORMAL HIGH (ref 8.7–10.3)
Chloride: 94 mmol/L — ABNORMAL LOW (ref 96–106)
Creatinine, Ser: 0.97 mg/dL (ref 0.57–1.00)
Globulin, Total: 2.5 g/dL (ref 1.5–4.5)
Glucose: 92 mg/dL (ref 70–99)
Potassium: 4.2 mmol/L (ref 3.5–5.2)
Sodium: 133 mmol/L — ABNORMAL LOW (ref 134–144)
Total Protein: 6.8 g/dL (ref 6.0–8.5)
eGFR: 62 mL/min/{1.73_m2} (ref >59)

## 2021-07-10 LAB — CBC WITH DIFFERENTIAL/PLATELET
Basophils Absolute: 0.1 10*3/uL (ref 0.0–0.2)
Basos: 1 %
EOS (ABSOLUTE): 0.1 10*3/uL (ref 0.0–0.4)
Eos: 2 %
Hematocrit: 34.5 % (ref 34.0–46.6)
Hemoglobin: 11.2 g/dL (ref 11.1–15.9)
Immature Grans (Abs): 0 10*3/uL (ref 0.0–0.1)
Immature Granulocytes: 0 %
Lymphocytes Absolute: 3 10*3/uL (ref 0.7–3.1)
Lymphs: 34 %
MCH: 30.9 pg (ref 26.6–33.0)
MCHC: 32.5 g/dL (ref 31.5–35.7)
MCV: 95 fL (ref 79–97)
Monocytes Absolute: 0.6 10*3/uL (ref 0.1–0.9)
Monocytes: 7 %
Neutrophils Absolute: 4.9 10*3/uL (ref 1.4–7.0)
Neutrophils: 56 %
Platelets: 225 10*3/uL (ref 150–450)
RBC: 3.63 x10E6/uL — ABNORMAL LOW (ref 3.77–5.28)
RDW: 12.8 % (ref 11.7–15.4)
WBC: 8.7 10*3/uL (ref 3.4–10.8)

## 2021-07-10 LAB — LIPID PANEL W/O CHOL/HDL RATIO
Cholesterol, Total: 131 mg/dL (ref 100–199)
HDL: 57 mg/dL (ref >39)
LDL Chol Calc (NIH): 58 mg/dL (ref 0–99)
Triglycerides: 83 mg/dL (ref 0–149)
VLDL Cholesterol Cal: 16 mg/dL (ref 5–40)

## 2021-07-15 DIAGNOSIS — R0600 Dyspnea, unspecified: Secondary | ICD-10-CM | POA: Diagnosis not present

## 2021-07-15 DIAGNOSIS — J301 Allergic rhinitis due to pollen: Secondary | ICD-10-CM | POA: Diagnosis not present

## 2021-07-15 DIAGNOSIS — R0609 Other forms of dyspnea: Secondary | ICD-10-CM | POA: Diagnosis not present

## 2021-07-15 DIAGNOSIS — R9389 Abnormal findings on diagnostic imaging of other specified body structures: Secondary | ICD-10-CM | POA: Diagnosis not present

## 2021-07-15 DIAGNOSIS — R634 Abnormal weight loss: Secondary | ICD-10-CM | POA: Diagnosis not present

## 2021-07-15 DIAGNOSIS — J189 Pneumonia, unspecified organism: Secondary | ICD-10-CM | POA: Diagnosis not present

## 2021-07-15 DIAGNOSIS — R109 Unspecified abdominal pain: Secondary | ICD-10-CM | POA: Diagnosis not present

## 2021-07-25 ENCOUNTER — Encounter: Payer: Self-pay | Admitting: Family Medicine

## 2021-07-26 ENCOUNTER — Other Ambulatory Visit: Payer: Self-pay

## 2021-07-26 MED ORDER — METOPROLOL SUCCINATE ER 50 MG PO TB24: 50.0000 mg | ORAL_TABLET | Freq: Every day | ORAL | 1 refills | Status: DC

## 2021-07-26 MED ORDER — CLOPIDOGREL BISULFATE 75 MG PO TABS: 75.0000 mg | ORAL_TABLET | Freq: Every day | ORAL | 1 refills | Status: DC

## 2021-07-26 NOTE — Telephone Encounter (Signed)
Patient is requesting refills, was seen last on 07/09/21 and has up coming appt on 08/16/21

## 2021-08-05 DIAGNOSIS — E782 Mixed hyperlipidemia: Secondary | ICD-10-CM | POA: Diagnosis not present

## 2021-08-05 DIAGNOSIS — R079 Chest pain, unspecified: Secondary | ICD-10-CM | POA: Diagnosis not present

## 2021-08-05 DIAGNOSIS — I10 Essential (primary) hypertension: Secondary | ICD-10-CM | POA: Diagnosis not present

## 2021-08-05 DIAGNOSIS — I2510 Atherosclerotic heart disease of native coronary artery without angina pectoris: Secondary | ICD-10-CM | POA: Diagnosis not present

## 2021-08-05 DIAGNOSIS — I340 Nonrheumatic mitral (valve) insufficiency: Secondary | ICD-10-CM | POA: Diagnosis not present

## 2021-08-10 DIAGNOSIS — R072 Precordial pain: Secondary | ICD-10-CM | POA: Diagnosis not present

## 2021-08-13 ENCOUNTER — Encounter: Payer: Self-pay | Admitting: Family Medicine

## 2021-08-16 ENCOUNTER — Encounter: Payer: Self-pay | Admitting: Family Medicine

## 2021-08-16 ENCOUNTER — Ambulatory Visit (INDEPENDENT_AMBULATORY_CARE_PROVIDER_SITE_OTHER): Payer: Medicare Other | Admitting: Family Medicine

## 2021-08-16 VITALS — BP 109/57 | HR 79 | Temp 98.0°F | Wt 96.0 lb

## 2021-08-16 DIAGNOSIS — E785 Hyperlipidemia, unspecified: Secondary | ICD-10-CM | POA: Diagnosis not present

## 2021-08-16 DIAGNOSIS — R634 Abnormal weight loss: Secondary | ICD-10-CM | POA: Diagnosis not present

## 2021-08-16 DIAGNOSIS — I952 Hypotension due to drugs: Secondary | ICD-10-CM | POA: Diagnosis not present

## 2021-08-16 DIAGNOSIS — I129 Hypertensive chronic kidney disease with stage 1 through stage 4 chronic kidney disease, or unspecified chronic kidney disease: Secondary | ICD-10-CM

## 2021-08-16 MED ORDER — PANTOPRAZOLE SODIUM 40 MG PO TBEC: 40.0000 mg | DELAYED_RELEASE_TABLET | Freq: Two times a day (BID) | ORAL | 1 refills | Status: DC

## 2021-08-16 MED ORDER — HYDRALAZINE HCL 25 MG PO TABS: 25.0000 mg | ORAL_TABLET | Freq: Two times a day (BID) | ORAL | 1 refills | Status: DC

## 2021-08-16 MED ORDER — FLUTICASONE-SALMETEROL 250-50 MCG/ACT IN AEPB: 1.0000 | INHALATION_SPRAY | Freq: Two times a day (BID) | RESPIRATORY_TRACT | 3 refills | Status: DC

## 2021-08-16 MED ORDER — LOSARTAN POTASSIUM 50 MG PO TABS: 50.0000 mg | ORAL_TABLET | Freq: Every day | ORAL | 1 refills | Status: DC

## 2021-08-16 MED ORDER — ALBUTEROL SULFATE HFA 108 (90 BASE) MCG/ACT IN AERS: 2.0000 | INHALATION_SPRAY | Freq: Four times a day (QID) | RESPIRATORY_TRACT | 3 refills | Status: DC | PRN

## 2021-08-16 MED ORDER — ATORVASTATIN CALCIUM 40 MG PO TABS: 40.0000 mg | ORAL_TABLET | Freq: Every day | ORAL | 1 refills | Status: DC

## 2021-08-16 MED ORDER — ISOSORBIDE MONONITRATE ER 60 MG PO TB24: 60.0000 mg | ORAL_TABLET | Freq: Every day | ORAL | 1 refills | Status: DC

## 2021-08-16 NOTE — Assessment & Plan Note (Signed)
Under good control on current regimen. Continue current regimen. Continue to monitor. Call with any concerns. Refills given. Labs drawn today.   

## 2021-08-16 NOTE — Assessment & Plan Note (Signed)
Will cut her losartan down to '50mg'$  and recheck 1 month. Call with any concerns. Labs drawn today. Await results. Refills of other medicines given.

## 2021-08-16 NOTE — Progress Notes (Signed)
BP (!) 109/57   Pulse 79   Temp 98 F (36.7 C)   Wt 96 lb (43.5 kg)   LMP  (LMP Unknown)   SpO2 97%   BMI 19.39 kg/m    Subjective:    Patient ID: Kristi Clark, female    DOB: 21-Sep-1950, 71 y.o.   MRN: [id number]  HPI: Kristi Clark is a 71 y.o. female  Chief Complaint  Patient presents with   Weight Check   WEIGHT LOSS Duration: weeks Amount of weight loss: 22lbs Fevers: no Decreased appetite: yes Night sweats: no Dysphagia/odynophagia: no Chest pain: no Shortness of breath: yes Cough: yes Nausea: no Vomiting: yes Abdominal pain: no Blood in stool: no Easy bruising/bleeding: no Jaundice: no Polydipsia/polyuria: no Depression: no Previous colonoscopy: no  HYPERTENSION / HYPERLIPIDEMIA Satisfied with current treatment? no Duration of hypertension: chronic BP monitoring frequency: not checking BP medication side effects: no Past BP meds: hydralizine, spironalactone, losartan, imdur Duration of hyperlipidemia: chronic Cholesterol medication side effects: no Cholesterol supplements: none Past cholesterol medications: atorvastatin Medication compliance: excellent compliance Aspirin: no Recent stressors: no Recurrent headaches: no Visual changes: no Palpitations: no Dyspnea: yes Chest pain: no Lower extremity edema: no Dizzy/lightheaded: yes  Relevant past medical, surgical, family and social history reviewed and updated as indicated. Interim medical history since our last visit reviewed. Allergies and medications reviewed and updated.  Review of Systems  Constitutional: Negative.   Respiratory:  Positive for shortness of breath. Negative for apnea, cough, choking, chest tightness, wheezing and stridor.   Cardiovascular: Negative.   Musculoskeletal: Negative.   Neurological: Negative.   Psychiatric/Behavioral: Negative.     Per HPI unless specifically indicated above     Objective:    BP (!) 109/57   Pulse 79   Temp 98 F (36.7  C)   Wt 96 lb (43.5 kg)   LMP  (LMP Unknown)   SpO2 97%   BMI 19.39 kg/m   Wt Readings from Last 3 Encounters:  08/16/21 96 lb (43.5 kg)  07/09/21 97 lb 6.4 oz (44.2 kg)  06/08/21 103 lb (46.7 kg)    Physical Exam Vitals and nursing note reviewed.  Constitutional:      General: She is not in acute distress.    Appearance: Normal appearance. She is not ill-appearing, toxic-appearing or diaphoretic.  HENT:     Head: Normocephalic and atraumatic.     Right Ear: External ear normal.     Left Ear: External ear normal.     Nose: Nose normal.     Mouth/Throat:     Mouth: Mucous membranes are moist.     Pharynx: Oropharynx is clear.  Eyes:     General: No scleral icterus.       Right eye: No discharge.        Left eye: No discharge.     Extraocular Movements: Extraocular movements intact.     Conjunctiva/sclera: Conjunctivae normal.     Pupils: Pupils are equal, round, and reactive to light.  Cardiovascular:     Rate and Rhythm: Normal rate and regular rhythm.     Pulses: Normal pulses.     Heart sounds: Normal heart sounds. No murmur heard.    No friction rub. No gallop.  Pulmonary:     Effort: Pulmonary effort is normal. No respiratory distress.     Breath sounds: Normal breath sounds. No stridor. No wheezing, rhonchi or rales.  Chest:     Chest wall: No tenderness.  Musculoskeletal:  General: Normal range of motion.     Cervical back: Normal range of motion and neck supple.  Skin:    General: Skin is warm and dry.     Capillary Refill: Capillary refill takes less than 2 seconds.     Coloration: Skin is not jaundiced or pale.     Findings: No bruising, erythema, lesion or rash.  Neurological:     General: No focal deficit present.     Mental Status: She is alert and oriented to person, place, and time. Mental status is at baseline.  Psychiatric:        Mood and Affect: Mood normal.        Behavior: Behavior normal.        Thought Content: Thought content  normal.        Judgment: Judgment normal.     Results for orders placed or performed in visit on 07/09/21  Microscopic Examination   Urine  Result Value Ref Range   WBC, UA 0-5 0 - 5 /hpf   RBC 0-2 0 - 2 /hpf   Epithelial Cells (non renal) >10 (A) 0 - 10 /hpf   Bacteria, UA Many (A) None seen/Few  Comprehensive metabolic panel  Result Value Ref Range   Glucose 92 70 - 99 mg/dL   BUN 9 8 - 27 mg/dL   Creatinine, Ser 0.97 0.57 - 1.00 mg/dL   eGFR 62 >59 mL/min/1.73   BUN/Creatinine Ratio 9 (L) 12 - 28   Sodium 133 (L) 134 - 144 mmol/L   Potassium 4.2 3.5 - 5.2 mmol/L   Chloride 94 (L) 96 - 106 mmol/L   CO2 24 20 - 29 mmol/L   Calcium 10.8 (H) 8.7 - 10.3 mg/dL   Total Protein 6.8 6.0 - 8.5 g/dL   Albumin 4.3 3.7 - 4.7 g/dL   Globulin, Total 2.5 1.5 - 4.5 g/dL   Albumin/Globulin Ratio 1.7 1.2 - 2.2   Bilirubin Total 0.3 0.0 - 1.2 mg/dL   Alkaline Phosphatase 92 44 - 121 IU/L   AST 38 0 - 40 IU/L   ALT 28 0 - 32 IU/L  CBC with Differential/Platelet  Result Value Ref Range   WBC 8.7 3.4 - 10.8 x10E3/uL   RBC 3.63 (L) 3.77 - 5.28 x10E6/uL   Hemoglobin 11.2 11.1 - 15.9 g/dL   Hematocrit 34.5 34.0 - 46.6 %   MCV 95 79 - 97 fL   MCH 30.9 26.6 - 33.0 pg   MCHC 32.5 31.5 - 35.7 g/dL   RDW 12.8 11.7 - 15.4 %   Platelets 225 150 - 450 x10E3/uL   Neutrophils 56 Not Estab. %   Lymphs 34 Not Estab. %   Monocytes 7 Not Estab. %   Eos 2 Not Estab. %   Basos 1 Not Estab. %   Neutrophils Absolute 4.9 1.4 - 7.0 x10E3/uL   Lymphocytes Absolute 3.0 0.7 - 3.1 x10E3/uL   Monocytes Absolute 0.6 0.1 - 0.9 x10E3/uL   EOS (ABSOLUTE) 0.1 0.0 - 0.4 x10E3/uL   Basophils Absolute 0.1 0.0 - 0.2 x10E3/uL   Immature Granulocytes 0 Not Estab. %   Immature Grans (Abs) 0.0 0.0 - 0.1 x10E3/uL  Lipid Panel w/o Chol/HDL Ratio  Result Value Ref Range   Cholesterol, Total 131 100 - 199 mg/dL   Triglycerides 83 0 - 149 mg/dL   HDL 57 >39 mg/dL   VLDL Cholesterol Cal 16 5 - 40 mg/dL   LDL Chol Calc  (NIH) 58 0 - 99  mg/dL  TSH  Result Value Ref Range   TSH 1.800 0.450 - 4.500 uIU/mL  Microalbumin, Urine Waived  Result Value Ref Range   Microalb, Ur Waived 80 (H) 0 - 19 mg/L   Creatinine, Urine Waived 200 10 - 300 mg/dL   Microalb/Creat Ratio <30 <30 mg/g  Urinalysis, Routine w reflex microscopic  Result Value Ref Range   Specific Gravity, UA 1.020 1.005 - 1.030   pH, UA 6.5 5.0 - 7.5   Color, UA Clear Yellow   Appearance Ur Cloudy (A) Clear   Leukocytes,UA 1+ (A) Negative   Protein,UA Negative Negative/Trace   Glucose, UA Negative Negative   Ketones, UA Negative Negative   RBC, UA Negative Negative   Bilirubin, UA Negative Negative   Urobilinogen, Ur 0.2 0.2 - 1.0 mg/dL   Nitrite, UA Negative Negative   Microscopic Examination See below:       Assessment & Plan:   Problem List Items Addressed This Visit       Cardiovascular and Mediastinum   Benign hypertension with chronic kidney disease    Will cut her losartan down to 65m and recheck 1 month. Call with any concerns. Labs drawn today. Await results. Refills of other medicines given.       Relevant Medications   losartan (COZAAR) 50 MG tablet   isosorbide mononitrate (IMDUR) 60 MG 24 hr tablet   hydrALAZINE (APRESOLINE) 25 MG tablet   atorvastatin (LIPITOR) 40 MG tablet   Other Relevant Orders   Comprehensive metabolic panel   CBC with Differential/Platelet     Other   Hyperlipidemia    Under good control on current regimen. Continue current regimen. Continue to monitor. Call with any concerns. Refills given. Labs drawn today.       Relevant Medications   losartan (COZAAR) 50 MG tablet   isosorbide mononitrate (IMDUR) 60 MG 24 hr tablet   hydrALAZINE (APRESOLINE) 25 MG tablet   atorvastatin (LIPITOR) 40 MG tablet   Other Relevant Orders   Comprehensive metabolic panel   Lipid Panel w/o Chol/HDL Ratio   CBC with Differential/Platelet   Unintentional weight loss - Primary    Weight loss has slowed.  Discussed starting remeron or waiting until after her CT in July. She would like to wait. Will follow up in 1 month- if weight drops again will start remeron. She notes that she has been eating better.       Relevant Orders   Comprehensive metabolic panel   CBC with Differential/Platelet   Other Visit Diagnoses     Hypotension due to drugs       Will cut her losartan down to 547mand recheck 1 month. Call with any concerns.    Relevant Medications   losartan (COZAAR) 50 MG tablet   isosorbide mononitrate (IMDUR) 60 MG 24 hr tablet   hydrALAZINE (APRESOLINE) 25 MG tablet   atorvastatin (LIPITOR) 40 MG tablet        Follow up plan: Return in about 4 weeks (around 09/13/2021).

## 2021-08-16 NOTE — Assessment & Plan Note (Signed)
Weight loss has slowed. Discussed starting remeron or waiting until after her CT in July. She would like to wait. Will follow up in 1 month- if weight drops again will start remeron. She notes that she has been eating better.

## 2021-08-17 LAB — CBC WITH DIFFERENTIAL/PLATELET
Basophils Absolute: 0.1 10*3/uL (ref 0.0–0.2)
Basos: 1 %
EOS (ABSOLUTE): 0.1 10*3/uL (ref 0.0–0.4)
Eos: 2 %
Hematocrit: 32.8 % — ABNORMAL LOW (ref 34.0–46.6)
Hemoglobin: 11 g/dL — ABNORMAL LOW (ref 11.1–15.9)
Immature Grans (Abs): 0 10*3/uL (ref 0.0–0.1)
Immature Granulocytes: 0 %
Lymphocytes Absolute: 2.6 10*3/uL (ref 0.7–3.1)
Lymphs: 39 %
MCH: 31.8 pg (ref 26.6–33.0)
MCHC: 33.5 g/dL (ref 31.5–35.7)
MCV: 95 fL (ref 79–97)
Monocytes Absolute: 0.5 10*3/uL (ref 0.1–0.9)
Monocytes: 7 %
Neutrophils Absolute: 3.4 10*3/uL (ref 1.4–7.0)
Neutrophils: 51 %
Platelets: 233 10*3/uL (ref 150–450)
RBC: 3.46 x10E6/uL — ABNORMAL LOW (ref 3.77–5.28)
RDW: 13.5 % (ref 11.7–15.4)
WBC: 6.7 10*3/uL (ref 3.4–10.8)

## 2021-08-17 LAB — COMPREHENSIVE METABOLIC PANEL
ALT: 20 IU/L (ref 0–32)
AST: 27 IU/L (ref 0–40)
Albumin/Globulin Ratio: 1.7 (ref 1.2–2.2)
Albumin: 4.2 g/dL (ref 3.7–4.7)
Alkaline Phosphatase: 97 IU/L (ref 44–121)
BUN/Creatinine Ratio: 13 (ref 12–28)
BUN: 12 mg/dL (ref 8–27)
Bilirubin Total: 0.3 mg/dL (ref 0.0–1.2)
CO2: 23 mmol/L (ref 20–29)
Calcium: 9.5 mg/dL (ref 8.7–10.3)
Chloride: 99 mmol/L (ref 96–106)
Creatinine, Ser: 0.89 mg/dL (ref 0.57–1.00)
Globulin, Total: 2.5 g/dL (ref 1.5–4.5)
Glucose: 87 mg/dL (ref 70–99)
Potassium: 4.3 mmol/L (ref 3.5–5.2)
Sodium: 135 mmol/L (ref 134–144)
Total Protein: 6.7 g/dL (ref 6.0–8.5)
eGFR: 69 mL/min/{1.73_m2} (ref >59)

## 2021-08-17 LAB — LIPID PANEL W/O CHOL/HDL RATIO
Cholesterol, Total: 107 mg/dL (ref 100–199)
HDL: 49 mg/dL (ref >39)
LDL Chol Calc (NIH): 42 mg/dL (ref 0–99)
Triglycerides: 81 mg/dL (ref 0–149)
VLDL Cholesterol Cal: 16 mg/dL (ref 5–40)

## 2021-08-19 DIAGNOSIS — J449 Chronic obstructive pulmonary disease, unspecified: Secondary | ICD-10-CM | POA: Diagnosis not present

## 2021-08-19 DIAGNOSIS — Z9861 Coronary angioplasty status: Secondary | ICD-10-CM | POA: Diagnosis not present

## 2021-08-19 DIAGNOSIS — I340 Nonrheumatic mitral (valve) insufficiency: Secondary | ICD-10-CM | POA: Diagnosis not present

## 2021-08-19 DIAGNOSIS — I2510 Atherosclerotic heart disease of native coronary artery without angina pectoris: Secondary | ICD-10-CM | POA: Diagnosis not present

## 2021-08-19 DIAGNOSIS — E782 Mixed hyperlipidemia: Secondary | ICD-10-CM | POA: Diagnosis not present

## 2021-08-19 DIAGNOSIS — I10 Essential (primary) hypertension: Secondary | ICD-10-CM | POA: Diagnosis not present

## 2021-08-31 ENCOUNTER — Encounter: Payer: Self-pay | Admitting: Family Medicine

## 2021-08-31 NOTE — Telephone Encounter (Signed)
No her BP was low and we cut down her losartan. I wasn't thinking she was taking her spironalactone.

## 2021-09-03 ENCOUNTER — Ambulatory Visit: Payer: Self-pay | Admitting: Cardiology

## 2021-09-03 DIAGNOSIS — I340 Nonrheumatic mitral (valve) insufficiency: Secondary | ICD-10-CM | POA: Diagnosis not present

## 2021-09-03 DIAGNOSIS — Z9861 Coronary angioplasty status: Secondary | ICD-10-CM | POA: Diagnosis not present

## 2021-09-03 DIAGNOSIS — R079 Chest pain, unspecified: Secondary | ICD-10-CM | POA: Diagnosis not present

## 2021-09-03 DIAGNOSIS — I10 Essential (primary) hypertension: Secondary | ICD-10-CM | POA: Diagnosis not present

## 2021-09-03 DIAGNOSIS — I2510 Atherosclerotic heart disease of native coronary artery without angina pectoris: Secondary | ICD-10-CM | POA: Diagnosis not present

## 2021-09-03 DIAGNOSIS — E782 Mixed hyperlipidemia: Secondary | ICD-10-CM | POA: Diagnosis not present

## 2021-09-03 DIAGNOSIS — J449 Chronic obstructive pulmonary disease, unspecified: Secondary | ICD-10-CM | POA: Diagnosis not present

## 2021-09-03 MED ORDER — SODIUM CHLORIDE 0.9% FLUSH: 3.0000 mL | Freq: Two times a day (BID) | INTRAVENOUS | Status: DC

## 2021-09-10 ENCOUNTER — Ambulatory Visit (INDEPENDENT_AMBULATORY_CARE_PROVIDER_SITE_OTHER): Payer: Medicare Other | Admitting: Family Medicine

## 2021-09-10 ENCOUNTER — Encounter: Payer: Self-pay | Admitting: Family Medicine

## 2021-09-10 VITALS — BP 106/53 | HR 80 | Temp 98.0°F | Wt 92.4 lb

## 2021-09-10 DIAGNOSIS — R079 Chest pain, unspecified: Secondary | ICD-10-CM | POA: Diagnosis not present

## 2021-09-10 DIAGNOSIS — I952 Hypotension due to drugs: Secondary | ICD-10-CM

## 2021-09-10 DIAGNOSIS — R634 Abnormal weight loss: Secondary | ICD-10-CM

## 2021-09-10 DIAGNOSIS — R1110 Vomiting, unspecified: Secondary | ICD-10-CM

## 2021-09-10 MED ORDER — MIRTAZAPINE 15 MG PO TABS: 15.0000 mg | ORAL_TABLET | Freq: Every day | ORAL | 2 refills | Status: DC

## 2021-09-10 MED ORDER — ONDANSETRON HCL 4 MG PO TABS: 4.0000 mg | ORAL_TABLET | Freq: Three times a day (TID) | ORAL | 1 refills | Status: DC | PRN

## 2021-09-10 MED ORDER — ISOSORBIDE MONONITRATE ER 60 MG PO TB24: 60.0000 mg | ORAL_TABLET | Freq: Two times a day (BID) | ORAL | 1 refills | Status: DC

## 2021-09-10 MED ORDER — LOSARTAN POTASSIUM 50 MG PO TABS: 25.0000 mg | ORAL_TABLET | Freq: Every day | ORAL | 1 refills | Status: DC

## 2021-09-10 NOTE — Progress Notes (Signed)
BP (!) 106/53   Pulse 80   Temp 98 F (36.7 C)   Wt 92 lb 6.4 oz (41.9 kg)   LMP  (LMP Unknown)   SpO2 97%   BMI 18.66 kg/m    Subjective:    Patient ID: Kristi Clark, female    DOB: 10/01/1950, 71 y.o.   MRN: [id number]  HPI: Kristi Clark is a 71 y.o. female  Chief Complaint  Patient presents with   URI    Patient states on Wednesday she starting coughing up phlegm. Patient has not had an appetite and has been vomiting. Patient states she feels very tired.    Seeing Dr. Chancy Milroy again on Tuesday. To have a cath. Had her isosorbide increased last visit.   HYPERTENSION Hypertension status: overtreated  Satisfied with current treatment? no Duration of hypertension: chronic BP monitoring frequency:  a few times a week BP medication side effects:  yes Medication compliance: excellent compliance Aspirin: yes Recurrent headaches: no Visual changes: no Palpitations: no Dyspnea: no Chest pain: no Lower extremity edema: no Dizzy/lightheaded: yes   Relevant past medical, surgical, family and social history reviewed and updated as indicated. Interim medical history since our last visit reviewed. Allergies and medications reviewed and updated.  Review of Systems  Constitutional:  Positive for fatigue and fever. Negative for activity change, appetite change, chills, diaphoresis and unexpected weight change.  HENT: Negative.    Respiratory: Negative.    Cardiovascular: Negative.   Gastrointestinal:  Positive for nausea and vomiting. Negative for abdominal distention, abdominal pain, anal bleeding, blood in stool, constipation, diarrhea and rectal pain.  Musculoskeletal: Negative.   Skin: Negative.   Neurological: Negative.   Psychiatric/Behavioral: Negative.      Per HPI unless specifically indicated above     Objective:    BP (!) 106/53   Pulse 80   Temp 98 F (36.7 C)   Wt 92 lb 6.4 oz (41.9 kg)   LMP  (LMP Unknown)   SpO2 97%   BMI 18.66 kg/m   Wt  Readings from Last 3 Encounters:  09/11/21 92 lb (41.7 kg)  09/10/21 92 lb 6.4 oz (41.9 kg)  08/16/21 96 lb (43.5 kg)    Physical Exam Vitals and nursing note reviewed.  Constitutional:      General: She is not in acute distress.    Appearance: Normal appearance. She is normal weight. She is not ill-appearing, toxic-appearing or diaphoretic.  HENT:     Head: Normocephalic and atraumatic.     Right Ear: External ear normal.     Left Ear: External ear normal.     Nose: Nose normal.     Mouth/Throat:     Mouth: Mucous membranes are moist.     Pharynx: Oropharynx is clear.  Eyes:     General: No scleral icterus.       Right eye: No discharge.        Left eye: No discharge.     Extraocular Movements: Extraocular movements intact.     Conjunctiva/sclera: Conjunctivae normal.     Pupils: Pupils are equal, round, and reactive to light.  Cardiovascular:     Rate and Rhythm: Normal rate and regular rhythm.     Pulses: Normal pulses.     Heart sounds: Normal heart sounds. No murmur heard.    No friction rub. No gallop.  Pulmonary:     Effort: Pulmonary effort is normal. No respiratory distress.     Breath sounds: Normal breath sounds.  No stridor. No wheezing, rhonchi or rales.  Chest:     Chest wall: No tenderness.  Musculoskeletal:        General: Normal range of motion.     Cervical back: Normal range of motion and neck supple.  Skin:    General: Skin is warm and dry.     Capillary Refill: Capillary refill takes less than 2 seconds.     Coloration: Skin is not jaundiced or pale.     Findings: No bruising, erythema, lesion or rash.  Neurological:     General: No focal deficit present.     Mental Status: She is alert and oriented to person, place, and time. Mental status is at baseline.  Psychiatric:        Mood and Affect: Mood normal.        Behavior: Behavior normal.        Thought Content: Thought content normal.        Judgment: Judgment normal.     Results for orders  placed or performed in visit on 08/16/21  Comprehensive metabolic panel  Result Value Ref Range   Glucose 87 70 - 99 mg/dL   BUN 12 8 - 27 mg/dL   Creatinine, Ser 0.89 0.57 - 1.00 mg/dL   eGFR 69 >59 mL/min/1.73   BUN/Creatinine Ratio 13 12 - 28   Sodium 135 134 - 144 mmol/L   Potassium 4.3 3.5 - 5.2 mmol/L   Chloride 99 96 - 106 mmol/L   CO2 23 20 - 29 mmol/L   Calcium 9.5 8.7 - 10.3 mg/dL   Total Protein 6.7 6.0 - 8.5 g/dL   Albumin 4.2 3.7 - 4.7 g/dL   Globulin, Total 2.5 1.5 - 4.5 g/dL   Albumin/Globulin Ratio 1.7 1.2 - 2.2   Bilirubin Total 0.3 0.0 - 1.2 mg/dL   Alkaline Phosphatase 97 44 - 121 IU/L   AST 27 0 - 40 IU/L   ALT 20 0 - 32 IU/L  Lipid Panel w/o Chol/HDL Ratio  Result Value Ref Range   Cholesterol, Total 107 100 - 199 mg/dL   Triglycerides 81 0 - 149 mg/dL   HDL 49 >39 mg/dL   VLDL Cholesterol Cal 16 5 - 40 mg/dL   LDL Chol Calc (NIH) 42 0 - 99 mg/dL  CBC with Differential/Platelet  Result Value Ref Range   WBC 6.7 3.4 - 10.8 x10E3/uL   RBC 3.46 (L) 3.77 - 5.28 x10E6/uL   Hemoglobin 11.0 (L) 11.1 - 15.9 g/dL   Hematocrit 32.8 (L) 34.0 - 46.6 %   MCV 95 79 - 97 fL   MCH 31.8 26.6 - 33.0 pg   MCHC 33.5 31.5 - 35.7 g/dL   RDW 13.5 11.7 - 15.4 %   Platelets 233 150 - 450 x10E3/uL   Neutrophils 51 Not Estab. %   Lymphs 39 Not Estab. %   Monocytes 7 Not Estab. %   Eos 2 Not Estab. %   Basos 1 Not Estab. %   Neutrophils Absolute 3.4 1.4 - 7.0 x10E3/uL   Lymphocytes Absolute 2.6 0.7 - 3.1 x10E3/uL   Monocytes Absolute 0.5 0.1 - 0.9 x10E3/uL   EOS (ABSOLUTE) 0.1 0.0 - 0.4 x10E3/uL   Basophils Absolute 0.1 0.0 - 0.2 x10E3/uL   Immature Granulocytes 0 Not Estab. %   Immature Grans (Abs) 0.0 0.0 - 0.1 x10E3/uL      Assessment & Plan:   Problem List Items Addressed This Visit       Other  Unintentional weight loss    Has lost an additional 4lbs since her last visit. Will cut down on her losartan. Follow up with cardiology and get her cath. Await  results. Will start mirtazapine to help with weight loss.       Other Visit Diagnoses     Hypotension due to drugs    -  Primary   BP running low. Will cut her losartan down. Continue imdur. Continue to follow with cardiology. Call with any concerns.    Relevant Medications   losartan (COZAAR) 50 MG tablet   isosorbide mononitrate (IMDUR) 60 MG 24 hr tablet   Vomiting, unspecified vomiting type, unspecified whether nausea present       Unclear if this is due to hypotension. Will cut her losartan and recheck shortly. Call with any concerns.    Chest pain, unspecified type       BP running low. Will cut her losartan down. Continue imdur. Continue to follow with cardiology. Call with any concerns.         Follow up plan: Return as scheduled.

## 2021-09-10 NOTE — Progress Notes (Incomplete)
Wt 92 lb 6.4 oz (41.9 kg)   LMP  (LMP Unknown)   BMI 18.66 kg/m    Subjective:    Patient ID: Kristi Clark, female    DOB: Aug 01, 1950, 71 y.o.   MRN: [id number]  HPI: Kristi Clark is a 71 y.o. female  No chief complaint on file.  HYPERTENSION Hypertension status: {Blank single:19197::"controlled","uncontrolled","better","worse","exacerbated","stable"}  Satisfied with current treatment? {Blank single:19197::"yes","no"} Duration of hypertension: {Blank single:19197::"chronic","months","years"} BP monitoring frequency:  {Blank single:19197::"not checking","rarely","daily","weekly","monthly","a few times a day","a few times a week","a few times a month"} BP range:  BP medication side effects:  {Blank single:19197::"yes","no"} Medication compliance: {Blank single:19197::"excellent compliance","good compliance","fair compliance","poor compliance"} Previous BP meds:{Blank IZTIWPYK:99833::"ASNK","NLZJQBHALP","FXTKWIOXBD/ZHGDJMEQAS","TMHDQQIW","LNLGXQJJHE","RDEYCXKGYJ/EHUD","JSHFWYOVZC (bystolic)","carvedilol","chlorthalidone","clonidine","diltiazem","exforge HCT","HCTZ","irbesartan (avapro)","labetalol","lisinopril","lisinopril-HCTZ","losartan (cozaar)","methyldopa","nifedipine","olmesartan (benicar)","olmesartan-HCTZ","quinapril","ramipril","spironalactone","tekturna","valsartan","valsartan-HCTZ","verapamil"} Aspirin: {Blank single:19197::"yes","no"} Recurrent headaches: {Blank single:19197::"yes","no"} Visual changes: {Blank single:19197::"yes","no"} Palpitations: {Blank single:19197::"yes","no"} Dyspnea: {Blank single:19197::"yes","no"} Chest pain: {Blank single:19197::"yes","no"} Lower extremity edema: {Blank single:19197::"yes","no"} Dizzy/lightheaded: {Blank single:19197::"yes","no"}  Relevant past medical, surgical, family and social history reviewed and updated as indicated. Interim medical history since our last visit reviewed. Allergies and medications reviewed and  updated.  Review of Systems  Per HPI unless specifically indicated above     Objective:    Wt 92 lb 6.4 oz (41.9 kg)   LMP  (LMP Unknown)   BMI 18.66 kg/m   Wt Readings from Last 3 Encounters:  09/10/21 92 lb 6.4 oz (41.9 kg)  08/16/21 96 lb (43.5 kg)  07/09/21 97 lb 6.4 oz (44.2 kg)    Physical Exam  Results for orders placed or performed in visit on 08/16/21  Comprehensive metabolic panel  Result Value Ref Range   Glucose 87 70 - 99 mg/dL   BUN 12 8 - 27 mg/dL   Creatinine, Ser 0.89 0.57 - 1.00 mg/dL   eGFR 69 >59 mL/min/1.73   BUN/Creatinine Ratio 13 12 - 28   Sodium 135 134 - 144 mmol/L   Potassium 4.3 3.5 - 5.2 mmol/L   Chloride 99 96 - 106 mmol/L   CO2 23 20 - 29 mmol/L   Calcium 9.5 8.7 - 10.3 mg/dL   Total Protein 6.7 6.0 - 8.5 g/dL   Albumin 4.2 3.7 - 4.7 g/dL   Globulin, Total 2.5 1.5 - 4.5 g/dL   Albumin/Globulin Ratio 1.7 1.2 - 2.2   Bilirubin Total 0.3 0.0 - 1.2 mg/dL   Alkaline Phosphatase 97 44 - 121 IU/L   AST 27 0 - 40 IU/L   ALT 20 0 - 32 IU/L  Lipid Panel w/o Chol/HDL Ratio  Result Value Ref Range   Cholesterol, Total 107 100 - 199 mg/dL   Triglycerides 81 0 - 149 mg/dL   HDL 49 >39 mg/dL   VLDL Cholesterol Cal 16 5 - 40 mg/dL   LDL Chol Calc (NIH) 42 0 - 99 mg/dL  CBC with Differential/Platelet  Result Value Ref Range   WBC 6.7 3.4 - 10.8 x10E3/uL   RBC 3.46 (L) 3.77 - 5.28 x10E6/uL   Hemoglobin 11.0 (L) 11.1 - 15.9 g/dL   Hematocrit 32.8 (L) 34.0 - 46.6 %   MCV 95 79 - 97 fL   MCH 31.8 26.6 - 33.0 pg   MCHC 33.5 31.5 - 35.7 g/dL   RDW 13.5 11.7 - 15.4 %   Platelets 233 150 - 450 x10E3/uL   Neutrophils 51 Not Estab. %   Lymphs 39 Not Estab. %   Monocytes 7 Not Estab. %   Eos 2 Not Estab. %   Basos 1 Not Estab. %   Neutrophils Absolute 3.4 1.4 -  7.0 x10E3/uL   Lymphocytes Absolute 2.6 0.7 - 3.1 x10E3/uL   Monocytes Absolute 0.5 0.1 - 0.9 x10E3/uL   EOS (ABSOLUTE) 0.1 0.0 - 0.4 x10E3/uL   Basophils Absolute 0.1 0.0 - 0.2 x10E3/uL    Immature Granulocytes 0 Not Estab. %   Immature Grans (Abs) 0.0 0.0 - 0.1 x10E3/uL      Assessment & Plan:   Problem List Items Addressed This Visit   None    Follow up plan: No follow-ups on file.

## 2021-09-10 NOTE — Patient Instructions (Signed)
Cut losartan to '25mg'$  daily Keep taking the 2 a day of the isosorbide mononitrate See Dr. Chancy Milroy as scheduled

## 2021-09-11 ENCOUNTER — Encounter: Payer: Self-pay | Admitting: Emergency Medicine

## 2021-09-11 ENCOUNTER — Emergency Department: Payer: Medicare Other

## 2021-09-11 ENCOUNTER — Inpatient Hospital Stay
Admission: EM | Admit: 2021-09-11 | Discharge: 2021-09-13 | DRG: 190 | Disposition: A | Discharge status: Home / Self Care | Payer: Medicare Other | Attending: Internal Medicine | Admitting: Internal Medicine

## 2021-09-11 ENCOUNTER — Other Ambulatory Visit: Payer: Self-pay

## 2021-09-11 DIAGNOSIS — F17210 Nicotine dependence, cigarettes, uncomplicated: Secondary | ICD-10-CM | POA: Diagnosis present

## 2021-09-11 DIAGNOSIS — Z66 Do not resuscitate: Secondary | ICD-10-CM | POA: Diagnosis present

## 2021-09-11 DIAGNOSIS — E785 Hyperlipidemia, unspecified: Secondary | ICD-10-CM | POA: Diagnosis present

## 2021-09-11 DIAGNOSIS — Z823 Family history of stroke: Secondary | ICD-10-CM | POA: Diagnosis not present

## 2021-09-11 DIAGNOSIS — J9621 Acute and chronic respiratory failure with hypoxia: Secondary | ICD-10-CM | POA: Diagnosis not present

## 2021-09-11 DIAGNOSIS — D649 Anemia, unspecified: Secondary | ICD-10-CM | POA: Diagnosis present

## 2021-09-11 DIAGNOSIS — Z85828 Personal history of other malignant neoplasm of skin: Secondary | ICD-10-CM | POA: Diagnosis not present

## 2021-09-11 DIAGNOSIS — Z8249 Family history of ischemic heart disease and other diseases of the circulatory system: Secondary | ICD-10-CM | POA: Diagnosis not present

## 2021-09-11 DIAGNOSIS — E871 Hypo-osmolality and hyponatremia: Secondary | ICD-10-CM | POA: Diagnosis present

## 2021-09-11 DIAGNOSIS — I2510 Atherosclerotic heart disease of native coronary artery without angina pectoris: Secondary | ICD-10-CM | POA: Diagnosis present

## 2021-09-11 DIAGNOSIS — J849 Interstitial pulmonary disease, unspecified: Secondary | ICD-10-CM | POA: Diagnosis present

## 2021-09-11 DIAGNOSIS — Z7951 Long term (current) use of inhaled steroids: Secondary | ICD-10-CM | POA: Diagnosis not present

## 2021-09-11 DIAGNOSIS — Z20822 Contact with and (suspected) exposure to covid-19: Secondary | ICD-10-CM | POA: Diagnosis present

## 2021-09-11 DIAGNOSIS — D72828 Other elevated white blood cell count: Secondary | ICD-10-CM | POA: Diagnosis present

## 2021-09-11 DIAGNOSIS — J441 Chronic obstructive pulmonary disease with (acute) exacerbation: Principal | ICD-10-CM | POA: Diagnosis present

## 2021-09-11 DIAGNOSIS — I10 Essential (primary) hypertension: Secondary | ICD-10-CM | POA: Diagnosis present

## 2021-09-11 DIAGNOSIS — Z79899 Other long term (current) drug therapy: Secondary | ICD-10-CM | POA: Diagnosis not present

## 2021-09-11 DIAGNOSIS — J439 Emphysema, unspecified: Secondary | ICD-10-CM | POA: Diagnosis not present

## 2021-09-11 DIAGNOSIS — J9601 Acute respiratory failure with hypoxia: Secondary | ICD-10-CM | POA: Diagnosis present

## 2021-09-11 DIAGNOSIS — Z9049 Acquired absence of other specified parts of digestive tract: Secondary | ICD-10-CM

## 2021-09-11 DIAGNOSIS — Z7902 Long term (current) use of antithrombotics/antiplatelets: Secondary | ICD-10-CM

## 2021-09-11 DIAGNOSIS — Z825 Family history of asthma and other chronic lower respiratory diseases: Secondary | ICD-10-CM

## 2021-09-11 DIAGNOSIS — Z833 Family history of diabetes mellitus: Secondary | ICD-10-CM | POA: Diagnosis not present

## 2021-09-11 DIAGNOSIS — R0789 Other chest pain: Secondary | ICD-10-CM | POA: Diagnosis not present

## 2021-09-11 DIAGNOSIS — R531 Weakness: Secondary | ICD-10-CM | POA: Diagnosis not present

## 2021-09-11 DIAGNOSIS — K219 Gastro-esophageal reflux disease without esophagitis: Secondary | ICD-10-CM | POA: Diagnosis present

## 2021-09-11 DIAGNOSIS — Z7982 Long term (current) use of aspirin: Secondary | ICD-10-CM | POA: Diagnosis not present

## 2021-09-11 DIAGNOSIS — Z803 Family history of malignant neoplasm of breast: Secondary | ICD-10-CM

## 2021-09-11 DIAGNOSIS — R0602 Shortness of breath: Secondary | ICD-10-CM | POA: Diagnosis not present

## 2021-09-11 DIAGNOSIS — R059 Cough, unspecified: Secondary | ICD-10-CM

## 2021-09-11 DIAGNOSIS — J449 Chronic obstructive pulmonary disease, unspecified: Secondary | ICD-10-CM | POA: Diagnosis present

## 2021-09-11 DIAGNOSIS — G8929 Other chronic pain: Secondary | ICD-10-CM | POA: Diagnosis present

## 2021-09-11 LAB — BASIC METABOLIC PANEL
Anion gap: 8 (ref 5–15)
BUN: 18 mg/dL (ref 8–23)
CO2: 23 mmol/L (ref 22–32)
Calcium: 9 mg/dL (ref 8.9–10.3)
Chloride: 98 mmol/L (ref 98–111)
Creatinine, Ser: 0.98 mg/dL (ref 0.44–1.00)
GFR, Estimated: >60 mL/min (ref >60)
Glucose, Bld: 98 mg/dL (ref 70–99)
Potassium: 4.2 mmol/L (ref 3.5–5.1)
Sodium: 129 mmol/L — ABNORMAL LOW (ref 135–145)

## 2021-09-11 LAB — TROPONIN I (HIGH SENSITIVITY)
Troponin I (High Sensitivity): 20 ng/L — ABNORMAL HIGH (ref <18)
Troponin I (High Sensitivity): 22 ng/L — ABNORMAL HIGH (ref <18)

## 2021-09-11 LAB — CBC
HCT: 30.7 % — ABNORMAL LOW (ref 36.0–46.0)
Hemoglobin: 10.3 g/dL — ABNORMAL LOW (ref 12.0–15.0)
MCH: 31.7 pg (ref 26.0–34.0)
MCHC: 33.6 g/dL (ref 30.0–36.0)
MCV: 94.5 fL (ref 80.0–100.0)
Platelets: 243 10*3/uL (ref 150–400)
RBC: 3.25 MIL/uL — ABNORMAL LOW (ref 3.87–5.11)
RDW: 14.3 % (ref 11.5–15.5)
WBC: 12.4 10*3/uL — ABNORMAL HIGH (ref 4.0–10.5)
nRBC: 0 % (ref 0.0–0.2)

## 2021-09-11 LAB — LACTIC ACID, PLASMA: Lactic Acid, Venous: 0.7 mmol/L (ref 0.5–1.9)

## 2021-09-11 LAB — SARS CORONAVIRUS 2 BY RT PCR: SARS Coronavirus 2 by RT PCR: NEGATIVE

## 2021-09-11 MED ORDER — ASPIRIN 81 MG PO TBEC
81.0000 mg | DELAYED_RELEASE_TABLET | Freq: Every day | ORAL | Status: DC
  Administered 2021-09-12 – 2021-09-13 (×2): 81 mg via ORAL
  Filled 2021-09-11 (×2): qty 1

## 2021-09-11 MED ORDER — SPIRONOLACTONE 25 MG PO TABS
12.5000 mg | ORAL_TABLET | Freq: Every day | ORAL | Status: DC
  Administered 2021-09-12 – 2021-09-13 (×2): 12.5 mg via ORAL
  Filled 2021-09-11: qty 1
  Filled 2021-09-11 (×2): qty 0.5
  Filled 2021-09-11: qty 1

## 2021-09-11 MED ORDER — LOSARTAN POTASSIUM 25 MG PO TABS
25.0000 mg | ORAL_TABLET | Freq: Every day | ORAL | Status: DC
  Administered 2021-09-13: 25 mg via ORAL
  Filled 2021-09-11 (×2): qty 1

## 2021-09-11 MED ORDER — IPRATROPIUM-ALBUTEROL 0.5-2.5 (3) MG/3ML IN SOLN
3.0000 mL | Freq: Once | RESPIRATORY_TRACT | Status: AC
  Administered 2021-09-11: 3 mL via RESPIRATORY_TRACT
  Filled 2021-09-11: qty 3

## 2021-09-11 MED ORDER — CLOPIDOGREL BISULFATE 75 MG PO TABS
75.0000 mg | ORAL_TABLET | Freq: Every day | ORAL | Status: DC
  Administered 2021-09-12 – 2021-09-13 (×2): 75 mg via ORAL
  Filled 2021-09-11 (×2): qty 1

## 2021-09-11 MED ORDER — PREDNISONE 20 MG PO TABS
40.0000 mg | ORAL_TABLET | Freq: Every day | ORAL | Status: DC
  Administered 2021-09-12 – 2021-09-13 (×2): 40 mg via ORAL
  Filled 2021-09-11 (×2): qty 2

## 2021-09-11 MED ORDER — MIRTAZAPINE 15 MG PO TABS
15.0000 mg | ORAL_TABLET | Freq: Every day | ORAL | Status: DC
  Administered 2021-09-12 (×2): 15 mg via ORAL
  Filled 2021-09-11 (×2): qty 1

## 2021-09-11 MED ORDER — METOPROLOL SUCCINATE ER 50 MG PO TB24
50.0000 mg | ORAL_TABLET | Freq: Every day | ORAL | Status: DC
  Administered 2021-09-12 – 2021-09-13 (×2): 50 mg via ORAL
  Filled 2021-09-11 (×2): qty 1

## 2021-09-11 MED ORDER — ONDANSETRON HCL 4 MG PO TABS: 4.0000 mg | ORAL_TABLET | Freq: Four times a day (QID) | ORAL | Status: DC | PRN

## 2021-09-11 MED ORDER — IPRATROPIUM-ALBUTEROL 0.5-2.5 (3) MG/3ML IN SOLN
3.0000 mL | Freq: Four times a day (QID) | RESPIRATORY_TRACT | Status: DC
Start: 2021-09-12 — End: 2021-09-12
  Administered 2021-09-12: 3 mL via RESPIRATORY_TRACT
  Filled 2021-09-11 (×2): qty 3

## 2021-09-11 MED ORDER — ATORVASTATIN CALCIUM 20 MG PO TABS
40.0000 mg | ORAL_TABLET | Freq: Every day | ORAL | Status: DC
  Administered 2021-09-12 – 2021-09-13 (×2): 40 mg via ORAL
  Filled 2021-09-11 (×2): qty 2

## 2021-09-11 MED ORDER — POLYETHYLENE GLYCOL 3350 17 G PO PACK
17.0000 g | PACK | Freq: Every day | ORAL | Status: DC | PRN
Start: 2021-09-11 — End: 2021-09-13

## 2021-09-11 MED ORDER — HYDRALAZINE HCL 25 MG PO TABS
25.0000 mg | ORAL_TABLET | Freq: Two times a day (BID) | ORAL | Status: DC
  Administered 2021-09-12 – 2021-09-13 (×2): 25 mg via ORAL
  Filled 2021-09-11 (×3): qty 1

## 2021-09-11 MED ORDER — IOHEXOL 350 MG/ML SOLN
80.0000 mL | Freq: Once | INTRAVENOUS | Status: AC | PRN
  Administered 2021-09-11: 80 mL via INTRAVENOUS

## 2021-09-11 MED ORDER — ISOSORBIDE MONONITRATE ER 30 MG PO TB24
60.0000 mg | ORAL_TABLET | Freq: Two times a day (BID) | ORAL | Status: DC
  Administered 2021-09-12 – 2021-09-13 (×2): 60 mg via ORAL
  Filled 2021-09-11 (×3): qty 2

## 2021-09-11 MED ORDER — SODIUM CHLORIDE 0.9 % IV BOLUS
1000.0000 mL | Freq: Once | INTRAVENOUS | Status: AC
  Administered 2021-09-11: 1000 mL via INTRAVENOUS

## 2021-09-11 MED ORDER — PREDNISONE 50 MG PO TABS
60.0000 mg | ORAL_TABLET | Freq: Once | ORAL | Status: DC
  Filled 2021-09-11: qty 3

## 2021-09-11 MED ORDER — ALBUTEROL SULFATE (2.5 MG/3ML) 0.083% IN NEBU: 2.5000 mg | INHALATION_SOLUTION | RESPIRATORY_TRACT | Status: DC | PRN

## 2021-09-11 MED ORDER — ENOXAPARIN SODIUM 40 MG/0.4ML IJ SOSY
40.0000 mg | PREFILLED_SYRINGE | INTRAMUSCULAR | Status: DC
  Administered 2021-09-12: 40 mg via SUBCUTANEOUS
  Filled 2021-09-11: qty 0.4

## 2021-09-11 MED ORDER — ACETAMINOPHEN 325 MG PO TABS: 650.0000 mg | ORAL_TABLET | Freq: Four times a day (QID) | ORAL | Status: DC | PRN

## 2021-09-11 MED ORDER — ACETAMINOPHEN 650 MG RE SUPP: 650.0000 mg | Freq: Four times a day (QID) | RECTAL | Status: DC | PRN

## 2021-09-11 MED ORDER — ONDANSETRON HCL 4 MG/2ML IJ SOLN
4.0000 mg | Freq: Once | INTRAMUSCULAR | Status: AC
Start: 2021-09-11 — End: 2021-09-11
  Administered 2021-09-11: 4 mg via INTRAVENOUS
  Filled 2021-09-11: qty 2

## 2021-09-11 MED ORDER — IPRATROPIUM-ALBUTEROL 0.5-2.5 (3) MG/3ML IN SOLN
3.0000 mL | Freq: Once | RESPIRATORY_TRACT | Status: AC
  Administered 2021-09-12: 3 mL via RESPIRATORY_TRACT
  Filled 2021-09-11: qty 3

## 2021-09-11 MED ORDER — ONDANSETRON HCL 4 MG/2ML IJ SOLN
4.0000 mg | Freq: Four times a day (QID) | INTRAMUSCULAR | Status: DC | PRN
  Administered 2021-09-12 (×2): 4 mg via INTRAVENOUS
  Filled 2021-09-11 (×2): qty 2

## 2021-09-11 MED ORDER — FAMOTIDINE 20 MG PO TABS
20.0000 mg | ORAL_TABLET | Freq: Every day | ORAL | Status: DC
  Administered 2021-09-12 – 2021-09-13 (×2): 20 mg via ORAL
  Filled 2021-09-11 (×2): qty 1

## 2021-09-11 NOTE — Assessment & Plan Note (Addendum)
COPD exacerbation with possible viral bronchitis CTA chest was negative for PE or pneumonia.  Patient had an normal echocardiogram back in February 2023 Scheduled and as needed nebulized bronchodilator treatments Oral steroids, antitussives Supplemental O2 to keep sats over 92%

## 2021-09-11 NOTE — Assessment & Plan Note (Signed)
No complaints of chest pain, EKG nonacute and troponin 22 Scheduled for cardiac cath on 7/11 by Dr. Humphrey Rolls Continue aspirin, atorvastatin, Imdur and metoprolol

## 2021-09-11 NOTE — ED Provider Notes (Signed)
[facility] Provider Note    Event Date/Time   First MD Initiated Contact with Patient 09/11/21 1940     (approximate)   History   Weakness   HPI  Kristi Clark is a 71 y.o. female with past medical history of COPD, depression, hypertension hyperlipidemia presents with multiple symptoms.  She endorses fatigue cough decreased appetite weakness since Wednesday.  She initially saw her primary doctor on Friday who told her she likely had a virus.  Blood pressure was also low in the office she was told to cut the losartan in half.  Patient has had intermittent chest pain for some time and is scheduled for cardiac cath which was facilitated by Dr. Chancy Milroy this Tuesday.  She endorses cough that is productive of clear sputum mild dyspnea.  Has had low-grade fevers and sweats chills.  She also feels quite fatigued has been having dry heaving and nausea no significant emesis.  No diarrhea no abdominal pain.  She notes that her chest pain that is chronic is actually okay today. No sick contacts  Past Medical History:  Diagnosis Date   AK (actinic keratosis) 10/25/2017   left forehead   BCC (basal cell carcinoma) 10/25/2017   left upper lateral eyelid, Moh's   COPD (chronic obstructive pulmonary disease) (HCC)    Depression    GERD (gastroesophageal reflux disease)    Hyperlipidemia    Hypertension    Menopause    Osteopenia    Tobacco abuse     Patient Active Problem List   Diagnosis Date Noted   Chest pain, cardiac 09/03/2021   Nausea and vomiting 05/28/2021   Valvular vegetation 05/06/2021   Malnutrition of moderate degree 04/29/2021   Pre-syncope 04/28/2021   Unintentional weight loss 04/28/2021   Encounter for tobacco use cessation counseling 04/28/2021   Congestive heart failure ([place]) 03/15/2021   Acute neck pain 02/24/2021   Pain in joint of right shoulder 12/16/2020   Cramps, extremity 05/29/2020   Acute pain of both shoulders 05/29/2020    Aortic atherosclerosis ([place]) 05/27/2020   Hyponatremia 02/17/2020   Hypokalemia 02/17/2020   Respiratory failure, acute ([place]) 02/17/2020   Nicotine dependence 02/17/2020   Postherpetic neuralgia 11/24/2019   Tobacco abuse 10/17/2017   IBS (irritable bowel syndrome) 04/04/2017   Advanced care planning/counseling discussion 10/03/2016   CAD (coronary artery disease) 10/03/2016   History of anemia 10/03/2016   Benign hypertension with chronic kidney disease 03/06/2015   Allergic rhinitis 09/03/2014   CKD (chronic kidney disease), 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   GERD (gastroesophageal reflux disease) 09/03/2014   Hyperlipidemia 09/03/2014   Anemia 07/14/2014   COPD, severe ([place]) 08/08/2013   Pulmonary nodules 08/08/2013     Physical Exam  Triage Vital Signs: ED Triage Vitals  Enc Vitals Group     BP 09/11/21 1913 (!) 124/97     Pulse Rate 09/11/21 1913 (!) 112     Resp 09/11/21 1913 20     Temp 09/11/21 1913 99.5 F (37.5 C)     Temp Source 09/11/21 1913 Oral     SpO2 09/11/21 1913 95 %     Weight 09/11/21 1914 92 lb (41.7 kg)     Height 09/11/21 1914 '4\' 11"'$  (1.499 m)     Head Circumference --      Peak Flow --      Pain Score 09/11/21 1919 4     Pain Loc --      Pain Edu? --  Excl. in [place]? --     Most recent vital signs: Vitals:   09/11/21 2137 09/11/21 2323  BP: (!) 148/48   Pulse: (!) 108 (!) 107  Resp: 20 (!) 21  Temp: 98.9 F (37.2 C)   SpO2: 97% 97%     General: Awake, no distress.  CV:  Good peripheral perfusion.  No peripheral edema Resp:  Normal effort.  Patient is mildly tachypneic but lungs are clear Abd:  No distention.  Neuro:             Awake, Alert, Oriented x 3  Other:  Mucous membranes are dry   ED Results / Procedures / Treatments  Labs (all labs ordered are listed, but only abnormal results are displayed) Labs Reviewed  BASIC METABOLIC PANEL - Abnormal; Notable for the following components:      Result Value   Sodium 129 (*)    All other  components within normal limits  CBC - Abnormal; Notable for the following components:   WBC 12.4 (*)    RBC 3.25 (*)    Hemoglobin 10.3 (*)    HCT 30.7 (*)    All other components within normal limits  TROPONIN I (HIGH SENSITIVITY) - Abnormal; Notable for the following components:   Troponin I (High Sensitivity) 20 (*)    All other components within normal limits  TROPONIN I (HIGH SENSITIVITY) - Abnormal; Notable for the following components:   Troponin I (High Sensitivity) 22 (*)    All other components within normal limits  SARS CORONAVIRUS 2 BY RT PCR  LACTIC ACID, PLASMA  URINALYSIS, ROUTINE W REFLEX MICROSCOPIC  LACTIC ACID, PLASMA  CBG MONITORING, ED     EKG  Interpreted by myself shows sinus tachycardia with normal axis normal intervals T wave inversions in lead III   RADIOLOGY I reviewed and interpreted the CXR which does not show any acute cardiopulmonary process    PROCEDURES:  Critical Care performed: No  .1-3 Lead EKG Interpretation  Performed by: Rada Hay, MD Authorized by: Rada Hay, MD     Interpretation: abnormal     ECG rate assessment: tachycardic     Rhythm: sinus rhythm     Ectopy: none     Conduction: normal     The patient is on the cardiac monitor to evaluate for evidence of arrhythmia and/or significant heart rate changes.   MEDICATIONS ORDERED IN ED: Medications  predniSONE (DELTASONE) tablet 60 mg (has no administration in time range)  ipratropium-albuterol (DUONEB) 0.5-2.5 (3) MG/3ML nebulizer solution 3 mL (has no administration in time range)  sodium chloride 0.9 % bolus 1,000 mL (0 mLs Intravenous Stopped 09/11/21 2138)  ondansetron (ZOFRAN) injection 4 mg (4 mg Intravenous Given 09/11/21 2005)  iohexol (OMNIPAQUE) 350 MG/ML injection 80 mL (80 mLs Intravenous Contrast Given 09/11/21 2154)  sodium chloride 0.9 % bolus 1,000 mL (1,000 mLs Intravenous New Bag/Given 09/11/21 2232)     IMPRESSION / MDM / ASSESSMENT AND  PLAN / ED COURSE  I reviewed the triage vital signs and the nursing notes.                              Patient's presentation is most consistent with acute presentation with potential threat to life or bodily function.  Differential diagnosis includes, but is not limited to, viral illness, AKI, dehydration, bacterial pneumonia, ACS  Patient is a 71 year old female with underlying COPD who is scheduled for  cardiac cath due to some intermittent chest pain this Tuesday who presents with about 5 days of fatigue, decreased appetite and productive cough generalized weakness.  She is mildly tachycardic but is otherwise within normal limits.  Has had some chills but no measured fevers dry heaving and not taking much p.o.  She has had unintentional weight loss its been more ongoing is being worked up by her outpatient provider for this.  No active chest pain currently.  On exam he is not wheezing does appear dry mildly tachypneic.  Plan to obtain labs to rule out AKI with known abnormality given the decreased p.o. intake chest x-ray to evaluate for pneumonia and give IV fluids for hypovolemia.  Patient's chest x-ray is clear.  Labs are overall reassuring she is mildly hyponatremic to 129 also has a leukocytosis to 12.  Troponin mildly elevated at 20 which will repeat.  Sleeping patient's oxygen saturation did drop to about 88% so she was placed on 2 L nasal cannula.  She does remain mildly tachycardic.  Given these findings and her dyspnea we will obtain a CTA.  We will give another liter of fluid check lactate.   CTA has no findings to suggest pneumonia or pulmonary embolism.  Plan is to check lactate repeat troponin and give another liter of fluid and reassess patient's vital signs.  Patient's repeat troponin is flat at 22.  Lactates negative.  On reassessment heart rate is in the 110s and while sleeping she dropped her sats to 83%.  Remains on 2 L satting 96%.  On reassessment she does have some scant  wheezing will give DuoNeb prednisone.  Will discuss with hospitalist for admission.  FINAL CLINICAL IMPRESSION(S) / ED DIAGNOSES   Final diagnoses:  Weakness  Cough, unspecified type     Rx / DC Orders   ED Discharge Orders     None        Note:  This document was prepared using Dragon voice recognition software and may include unintentional dictation errors.   Rada Hay, MD 09/11/21 [phone]

## 2021-09-11 NOTE — Assessment & Plan Note (Signed)
Suspect hypovolemic and related to poor oral intake over the last several days Received 2 L NS in the ED Continue to monitor

## 2021-09-11 NOTE — ED Triage Notes (Signed)
Pt reports Wednesday she started coughing reports she started to feel weak and tired, can't eat or drink anything, has nausea and vomiting. Pt denies any diarrhea. Pt talks in complete sentences respiratory  no distress noted

## 2021-09-11 NOTE — Assessment & Plan Note (Signed)
Stable at baseline at 10.3

## 2021-09-11 NOTE — ED Notes (Signed)
Pt oxygen saturation dropped to 84%, RN placed pt on 2L [place]. Pt's oxygen saturation is now 97% on 2L [place]. Starleen Blue, MD made aware.

## 2021-09-11 NOTE — H&P (Signed)
History and Physical    Patient: Kristi Clark XHB:[id number] DOB: 20-Aug-1950 DOA: 09/11/2021 DOS: the patient was seen and examined on 09/11/2021 PCP: [name], DO  Patient coming from: Home  Chief Complaint:  Chief Complaint  Patient presents with   Weakness    HPI: Kristi Clark is a 71 y.o. female with medical history significant for Severe COPD but not on oxygen, ongoing tobacco use disorder, CAD s/p scheduled for cardiac cath on 7/11 for work-up of intermittent chest pain, interstitial lung disease followed by pulmonology, who presents to the ED with generalized malaise, cough productive of clear phlegm and shortness of breath as well as wheezing.  She also has decreased oral intake with nausea and has vomited but denies abdominal pain or diarrhea she denies fever or chills. ED course and data review: Temp 99.5, pulse 112 with respirations 20 BP 124/97.  O2 sat was initially 95% on room air but did desaturate to the high 80s on room air requiring placement of O2 at 2 L with improvement in sats to the high 90s. Labs: WBC 12,000 with lactic acid 0.7 and COVID-negative.  Hemoglobin at baseline at 10.3.  Sodium 129.  Troponin 22. EKG, personally viewed and interpreted with sinus tachycardia with no acute ST-T wave changes CTA chest PE protocol with no evidence of PE.  Shows scarring in the lungs bilaterally and a 1 cm incidental left thyroid nodule with no follow-up imaging recommended  Patient treated with DuoNebs and prednisone given an IV fluid bolus.  Hospitalist consulted for admission.     Past Medical History:  Diagnosis Date   AK (actinic keratosis) 10/25/2017   left forehead   BCC (basal cell carcinoma) 10/25/2017   left upper lateral eyelid, Moh's   COPD (chronic obstructive pulmonary disease) (HCC)    Depression    GERD (gastroesophageal reflux disease)    Hyperlipidemia    Hypertension    Menopause    Osteopenia    Tobacco abuse    Past Surgical  History:  Procedure Laterality Date   CARDIAC CATHETERIZATION     CHOLECYSTECTOMY  2014   COLONOSCOPY WITH PROPOFOL N/A 01/16/2018   Procedure: COLONOSCOPY WITH PROPOFOL;  Surgeon: Lollie Sails, MD;  Location: [facility] ENDOSCOPY;  Service: Endoscopy;  Laterality: N/A;   ESOPHAGOGASTRODUODENOSCOPY (EGD) WITH PROPOFOL N/A 01/16/2018   Procedure: ESOPHAGOGASTRODUODENOSCOPY (EGD) WITH PROPOFOL;  Surgeon: Lollie Sails, MD;  Location: [facility] ENDOSCOPY;  Service: Endoscopy;  Laterality: N/A;   heart stent     TEE WITHOUT CARDIOVERSION N/A 05/11/2021   Procedure: TRANSESOPHAGEAL ECHOCARDIOGRAM (TEE);  Surgeon: Corey Skains, MD;  Location: [facility] ORS;  Service: Cardiovascular;  Laterality: N/A;   TOTAL ABDOMINAL HYSTERECTOMY     Social History:  reports that she has been smoking cigarettes. She has a 56.00 pack-year smoking history. She has never used smokeless tobacco. She reports that she does not drink alcohol and does not use drugs.  Allergies  Allergen Reactions   Clindamycin/Lincomycin Hives   Prednisone Other (See Comments)    Psychosis   Amoxicillin Rash   Avelox [Moxifloxacin Hcl In Nacl] Rash   Codeine Sulfate Nausea Only   Penicillins Rash   Sulfa Antibiotics Rash    Family History  Problem Relation Age of Onset   Cancer Mother    Stroke Mother    Heart disease Father    Hyperlipidemia Father    Cancer Sister    Diabetes Sister    Breast cancer Sister 60   Diabetes  Brother    Asthma Son    Cancer Son    Diabetes Daughter    Hypertension Daughter    Cancer Maternal Grandmother        gallbladder   Diabetes Brother    Heart disease Brother    Breast cancer Paternal Aunt     Prior to Admission medications   Medication Sig Start Date End Date Taking? Authorizing Provider  albuterol (VENTOLIN HFA) 108 (90 Base) MCG/ACT inhaler Inhale 2 puffs into the lungs every 6 (six) hours as needed for wheezing or shortness of breath. 08/16/21   Park Liter P, DO  aspirin  81 MG tablet Take 81 mg by mouth daily.    [provider]  atorvastatin (LIPITOR) 40 MG tablet Take 1 tablet (40 mg total) by mouth daily. 08/16/21   Park Liter P, DO  Blood Pressure Monitoring (BLOOD PRESSURE MONITOR/WRIST) KIT Take Blood pressure as needed, Dx: I12.9 12/09/20   Park Liter P, DO  Calcium Carbonate (CALCIUM 600 PO) Take by mouth daily.    [provider]  Cetirizine HCl (ZYRTEC PO) Take by mouth daily.    [provider]  clopidogrel (PLAVIX) 75 MG tablet Take 1 tablet (75 mg total) by mouth daily. 07/26/21   Johnson, Megan P, DO  famotidine (PEPCID) 20 MG tablet Take 20 mg by mouth daily. 07/05/21   [provider]  fluticasone (FLONASE) 50 MCG/ACT nasal spray Place 2 sprays into both nostrils daily. 07/31/20   Johnson, Megan P, DO  fluticasone-salmeterol (ADVAIR DISKUS) 250-50 MCG/ACT AEPB Inhale 1 puff into the lungs in the morning and at bedtime. 08/16/21   Park Liter P, DO  hydrALAZINE (APRESOLINE) 25 MG tablet Take 1 tablet (25 mg total) by mouth 2 (two) times daily. 08/16/21   Johnson, Megan P, DO  isosorbide mononitrate (IMDUR) 60 MG 24 hr tablet Take 1 tablet (60 mg total) by mouth 2 (two) times daily. 09/10/21   Johnson, Megan P, DO  losartan (COZAAR) 50 MG tablet Take 0.5 tablets (25 mg total) by mouth daily. 09/10/21   Johnson, Megan P, DO  metoprolol succinate (TOPROL-XL) 50 MG 24 hr tablet Take 1 tablet (50 mg total) by mouth daily. Take with or immediately following a meal. 07/26/21   Johnson, Megan P, DO  mirtazapine (REMERON) 15 MG tablet Take 1 tablet (15 mg total) by mouth at bedtime. 09/10/21   Johnson, Megan P, DO  Multiple Vitamins-Minerals (CENTRUM SILVER ADULT 50+ PO) Take 1 tablet by mouth daily.    [provider]  Multiple Vitamins-Minerals (PRESERVISION AREDS 2+MULTI VIT) CAPS Take 1 capsule by mouth in the morning and at bedtime.    [provider]  nystatin (MYCOSTATIN) 100000 UNIT/ML suspension Take 5  mLs (500,000 Units total) by mouth 4 (four) times daily. 03/02/21   McElwee, Lauren A, NP  ondansetron (ZOFRAN) 4 MG tablet Take 1 tablet (4 mg total) by mouth every 8 (eight) hours as needed for nausea or vomiting. 09/10/21   Johnson, Megan P, DO  polyethylene glycol powder (GLYCOLAX/MIRALAX) 17 GM/SCOOP powder Take 17 g by mouth daily as needed for moderate constipation. 12/09/20   Johnson, Megan P, DO  Probiotic Product (PROBIOTIC-10 PO) Take by mouth. Every other day    [provider]  spironolactone (ALDACTONE) 25 MG tablet Take 12.5 mg by mouth daily. 03/19/21   [provider]  tiotropium (SPIRIVA) 18 MCG inhalation capsule Place 1 capsule (18 mcg total) into inhaler and inhale daily. 03/29/21   Mathis Dad,  Santiago Glad, NP    Physical Exam: Vitals:   09/11/21 1913 09/11/21 1914 09/11/21 2137 09/11/21 2323  BP: (!) 124/97  (!) 148/48   Pulse: (!) 112  (!) 108 (!) 107  Resp: 20  20 (!) 21  Temp: 99.5 F (37.5 C)  98.9 F (37.2 C)   TempSrc: Oral  Oral   SpO2: 95%  97% 97%  Weight:  41.7 kg    Height:  4' 11"  (1.499 m)     Physical Exam Vitals and nursing note reviewed.  Constitutional:      General: She is not in acute distress. HENT:     Head: Normocephalic and atraumatic.  Cardiovascular:     Rate and Rhythm: Regular rhythm. Tachycardia present.     Heart sounds: Normal heart sounds.  Pulmonary:     Effort: Pulmonary effort is normal.     Breath sounds: Wheezing and rhonchi present.  Abdominal:     Palpations: Abdomen is soft.     Tenderness: There is no abdominal tenderness.  Neurological:     Mental Status: Mental status is at baseline.     Labs on Admission: I have personally reviewed following labs and imaging studies  CBC: Recent Labs  Lab 09/11/21 1945  WBC 12.4*  HGB 10.3*  HCT 30.7*  MCV 94.5  PLT 542   Basic Metabolic Panel: Recent Labs  Lab 09/11/21 1945  NA 129*  K 4.2  CL 98  CO2 23  GLUCOSE 98  BUN 18  CREATININE 0.98   CALCIUM 9.0   GFR: Estimated Creatinine Clearance: 34.2 mL/min (by C-G formula based on SCr of 0.98 mg/dL). Liver Function Tests: No results for input(s): "AST", "ALT", "ALKPHOS", "BILITOT", "PROT", "ALBUMIN" in the last 168 hours. No results for input(s): "LIPASE", "AMYLASE" in the last 168 hours. No results for input(s): "AMMONIA" in the last 168 hours. Coagulation Profile: No results for input(s): "INR", "PROTIME" in the last 168 hours. Cardiac Enzymes: No results for input(s): "CKTOTAL", "CKMB", "CKMBINDEX", "TROPONINI" in the last 168 hours. BNP (last 3 results) No results for input(s): "PROBNP" in the last 8760 hours. HbA1C: No results for input(s): "HGBA1C" in the last 72 hours. CBG: No results for input(s): "GLUCAP" in the last 168 hours. Lipid Profile: No results for input(s): "CHOL", "HDL", "LDLCALC", "TRIG", "CHOLHDL", "LDLDIRECT" in the last 72 hours. Thyroid Function Tests: No results for input(s): "TSH", "T4TOTAL", "FREET4", "T3FREE", "THYROIDAB" in the last 72 hours. Anemia Panel: No results for input(s): "VITAMINB12", "FOLATE", "FERRITIN", "TIBC", "IRON", "RETICCTPCT" in the last 72 hours. Urine analysis:    Component Value Date/Time   COLORURINE YELLOW (A) 04/28/2021 1838   APPEARANCEUR Cloudy (A) 07/09/2021 0827   LABSPEC 1.004 (L) 04/28/2021 1838   PHURINE 7.0 04/28/2021 1838   GLUCOSEU Negative 07/09/2021 0827   HGBUR NEGATIVE 04/28/2021 1838   BILIRUBINUR Negative 07/09/2021 0827   KETONESUR NEGATIVE 04/28/2021 1838   PROTEINUR Negative 07/09/2021 0827   PROTEINUR NEGATIVE 04/28/2021 1838   NITRITE Negative 07/09/2021 0827   NITRITE NEGATIVE 04/28/2021 1838   LEUKOCYTESUR 1+ (A) 07/09/2021 0827   LEUKOCYTESUR NEGATIVE 04/28/2021 1838    Radiological Exams on Admission: CT Angio Chest PE W and/or Wo Contrast  Result Date: 09/11/2021 CLINICAL DATA:  Difficulty breathing EXAM: CT ANGIOGRAPHY CHEST WITH CONTRAST TECHNIQUE: Multidetector CT imaging of  the chest was performed using the standard protocol during bolus administration of intravenous contrast. Multiplanar CT image reconstructions and MIPs were obtained to evaluate the vascular anatomy. RADIATION DOSE REDUCTION: This exam  was performed according to the departmental dose-optimization program which includes automated exposure control, adjustment of the mA and/or kV according to patient size and/or use of iterative reconstruction technique. CONTRAST:  9m OMNIPAQUE IOHEXOL 350 MG/ML SOLN COMPARISON:  Chest x-ray from earlier in the same day, CT from 06/07/2021. FINDINGS: Cardiovascular: Thoracic aorta demonstrates diffuse atherosclerotic calcifications without aneurysmal dilatation or dissection. No cardiac enlargement is noted. Scattered coronary calcifications are seen. The pulmonary artery shows a normal branching pattern. No filling defect to suggest pulmonary embolism is noted. Mediastinum/Nodes: Thoracic inlet is within normal limits. A 1 cm hypodense lesion in the left lobe of the thyroid is seen. No hilar or mediastinal adenopathy is noted. The esophagus is within normal limits. Lungs/Pleura: Some scarring is noted in the left lung base. Biapical pleural and parenchymal scarring is noted. Diffuse emphysematous changes are noted stable in appearance from the prior exam. Scarring is noted in the right lung unchanged from the prior study. Upper Abdomen: Visualized upper abdomen shows changes consistent with cholecystectomy. Musculoskeletal: Degenerative changes of the thoracic spine are noted. No acute rib abnormality is noted. Review of the MIP images confirms the above findings. IMPRESSION: No evidence of pulmonary embolism. Scarring in the lungs bilaterally. 1 cm incidental left thyroid nodule. No follow-up imaging is recommended. Reference: J Am Coll Radiol. 2015 Feb;12(2): 143-50 No other focal abnormality is noted. Aortic Atherosclerosis (ICD10-I70.0) and Emphysema (ICD10-J43.9). Electronically  Signed   By: MInez CatalinaM.D.   On: 09/11/2021 22:12   DG Chest 2 View  Result Date: 09/11/2021 CLINICAL DATA:  Shortness of breath EXAM: CHEST - 2 VIEW COMPARISON:  02/24/2021, 04/28/2021, CT 06/07/2021 FINDINGS: Hyperinflation with emphysema. Pleural and parenchymal scarring at the apices. No acute airspace disease. Stable cardiomediastinal silhouette. Aortic atherosclerosis IMPRESSION: No active cardiopulmonary disease.  Hyperinflation and emphysema Electronically Signed   By: KDonavan FoilM.D.   On: 09/11/2021 20:19     Data Reviewed: Relevant notes from primary care and specialist visits, past discharge summaries as available in EHR, including Care Everywhere. Prior diagnostic testing as pertinent to current admission diagnoses Updated medications and problem lists for reconciliation ED course, including vitals, labs, imaging, treatment and response to treatment Triage notes, nursing and pharmacy notes and ED provider's notes Notable results as noted in HPI   Assessment and Plan: * Acute respiratory failure with hypoxemia (HCC) COPD exacerbation with possible viral bronchitis CTA chest was negative for PE or pneumonia.  Patient had an normal echocardiogram back in February 2023 Scheduled and as needed nebulized bronchodilator treatments Oral steroids, antitussives Supplemental O2 to keep sats over 92%   Hyponatremia Suspect hypovolemic and related to poor oral intake over the last several days Received 2 L NS in the ED Continue to monitor  CAD (coronary artery disease) No complaints of chest pain, EKG nonacute and troponin 22 Scheduled for cardiac cath on 7/11 by Dr. KHumphrey RollsContinue aspirin, atorvastatin, Imdur and metoprolol  Chronic anemia Stable at baseline at 10.3   DVT prophylaxis: Lovenox  Consults: none  Advance Care Planning:   Code Status: Prior   Family Communication: none  Disposition Plan: Back to previous home environment  Severity of Illness: The  appropriate patient status for this patient is INPATIENT. Inpatient status is judged to be reasonable and necessary in order to provide the required intensity of service to ensure the patient's safety. The patient's presenting symptoms, physical exam findings, and initial radiographic and laboratory data in the context of their chronic comorbidities is felt to  place them at high risk for further clinical deterioration. Furthermore, it is not anticipated that the patient will be medically stable for discharge from the hospital within 2 midnights of admission.   * I certify that at the point of admission it is my clinical judgment that the patient will require inpatient hospital care spanning beyond 2 midnights from the point of admission due to high intensity of service, high risk for further deterioration and high frequency of surveillance required.*  Author: Athena Masse, MD 09/11/2021 11:50 PM  For on call review [email or web address].

## 2021-09-12 ENCOUNTER — Encounter: Payer: Self-pay | Admitting: Internal Medicine

## 2021-09-12 DIAGNOSIS — J441 Chronic obstructive pulmonary disease with (acute) exacerbation: Secondary | ICD-10-CM | POA: Diagnosis not present

## 2021-09-12 DIAGNOSIS — J9601 Acute respiratory failure with hypoxia: Secondary | ICD-10-CM | POA: Diagnosis not present

## 2021-09-12 LAB — URINALYSIS, ROUTINE W REFLEX MICROSCOPIC
Bilirubin Urine: NEGATIVE
Glucose, UA: NEGATIVE mg/dL
Hgb urine dipstick: NEGATIVE
Ketones, ur: 20 mg/dL — AB
Leukocytes,Ua: NEGATIVE
Nitrite: NEGATIVE
Protein, ur: 30 mg/dL — AB
Specific Gravity, Urine: 1.04 — ABNORMAL HIGH (ref 1.005–1.030)
pH: 5 (ref 5.0–8.0)

## 2021-09-12 LAB — LACTIC ACID, PLASMA: Lactic Acid, Venous: 0.8 mmol/L (ref 0.5–1.9)

## 2021-09-12 MED ORDER — ENOXAPARIN SODIUM 30 MG/0.3ML IJ SOSY
30.0000 mg | PREFILLED_SYRINGE | INTRAMUSCULAR | Status: DC
  Administered 2021-09-12: 30 mg via SUBCUTANEOUS
  Filled 2021-09-12: qty 0.3

## 2021-09-12 MED ORDER — IPRATROPIUM-ALBUTEROL 0.5-2.5 (3) MG/3ML IN SOLN
3.0000 mL | Freq: Three times a day (TID) | RESPIRATORY_TRACT | Status: DC
  Administered 2021-09-12 – 2021-09-13 (×2): 3 mL via RESPIRATORY_TRACT
  Filled 2021-09-12 (×2): qty 3

## 2021-09-12 NOTE — ED Notes (Signed)
Patient resting in bed with eyes closed, appears sleeping. Resp even, unlabored on 2L per Lucas. NSR on monitor at 98. No distress noted at this time.

## 2021-09-12 NOTE — Progress Notes (Addendum)
1205 Pt weaned down from 2L [place] to 1L pt sat reading 100%. Nurse will cont. To monitor and attempt to wean to room air  1556 Pt weaned down to room air O2 sat 97-99%. Incentive spirometer given to pt, pt can reach 500.

## 2021-09-12 NOTE — ED Notes (Signed)
Pt 100% on 2LNC. Pt states she feels like she is breathing better. Pt placed on room air.

## 2021-09-12 NOTE — ED Notes (Signed)
Pt oob to BR

## 2021-09-12 NOTE — Progress Notes (Addendum)
Progress Note    Kristi Clark  JFH:[id number] DOB: 1951/01/04  DOA: 09/11/2021 PCP: [name], DO      Brief Narrative:    Medical records reviewed and are as summarized below:  Kristi Clark is a 71 y.o. female with medical history significant for Severe COPD but not on oxygen, ongoing tobacco use disorder, CAD s/p scheduled for cardiac cath on 7/11 for work-up of intermittent chest pain, interstitial lung disease followed by pulmonology, who presented to the hospital with generalized weakness, productive cough, shortness of breath, wheezing and poor oral intake.  Oxygen saturation dropped to 85% on room air.         Assessment/Plan:   Principal Problem:   Acute respiratory failure with hypoxemia (HCC) Active Problems:   COPD with acute exacerbation (HCC)   Hyponatremia   Chronic anemia   CAD (coronary artery disease)    Body mass index is 18.58 kg/m.  COPD exacerbation: Continue steroids and bronchodilators  Acute hypoxic respiratory failure: Continue 2 L/min oxygen and wean off as able.  Hyponatremia, chronic, repeat BMP  CAD: Continue aspirin, Plavix and Lipitor.  He is scheduled for cardiac cath on 09/14/2021 by Dr. Humphrey Rolls.  Hypertension: Continue antihypertensives  Other comorbidities include chronic anemia, interstitial lung disease    Diet Order             Diet Heart Room service appropriate? Yes; Fluid consistency: Thin  Diet effective now                            Consultants: None  Procedures: None    Medications:    aspirin EC  81 mg Oral Daily   atorvastatin  40 mg Oral Daily   clopidogrel  75 mg Oral Daily   enoxaparin (LOVENOX) injection  30 mg Subcutaneous Q24H   famotidine  20 mg Oral Daily   hydrALAZINE  25 mg Oral BID   ipratropium-albuterol  3 mL Nebulization Q6H   isosorbide mononitrate  60 mg Oral BID   losartan  25 mg Oral Daily   metoprolol succinate  50 mg Oral Daily    mirtazapine  15 mg Oral QHS   predniSONE  40 mg Oral Q breakfast   predniSONE  60 mg Oral Once   spironolactone  12.5 mg Oral Daily   Continuous Infusions:   Anti-infectives (From admission, onward)    None              Family Communication/Anticipated D/C date and plan/Code Status   DVT prophylaxis: enoxaparin (LOVENOX) injection 30 mg Start: 09/12/21 2200     Code Status: DNR  Family Communication: None Disposition Plan: Plan to discharge only 1 to 2 days   Status is: Inpatient Remains inpatient appropriate because: COPD exacerbation       Subjective:   Interval events noted.  She complains of cough but unable to bring up phlegm.  She also feels short of breath and weak.  No chest pain.  Objective:    Vitals:   09/12/21 0745 09/12/21 0853 09/12/21 1202 09/12/21 1311  BP:  (!) 148/40 (!) 129/52   Pulse:  (!) 101 76   Resp:  20    Temp: 98.7 F (37.1 C) 98.7 F (37.1 C)    TempSrc: Oral Oral    SpO2:  92% 100% 95%  Weight:      Height:       No data found.  Intake/Output Summary (Last 24 hours) at 09/12/2021 1342 Last data filed at 09/12/2021 1329 Gross per 24 hour  Intake 1200 ml  Output 301 ml  Net 899 ml   Filed Weights   09/11/21 1914  Weight: 41.7 kg    Exam:  GEN: NAD SKIN: No rash EYES: EOMI ENT: MMM CV: RRR PULM: Decreased air entry bilaterally, no wheezing or rales heard ABD: soft, ND, NT, +BS CNS: AAO x 3, non focal EXT: No edema or tenderness        Data Reviewed:   I have personally reviewed following labs and imaging studies:  Labs: Labs show the following:   Basic Metabolic Panel: Recent Labs  Lab 09/11/21 1945  NA 129*  K 4.2  CL 98  CO2 23  GLUCOSE 98  BUN 18  CREATININE 0.98  CALCIUM 9.0   GFR Estimated Creatinine Clearance: 34.2 mL/min (by C-G formula based on SCr of 0.98 mg/dL). Liver Function Tests: No results for input(s): "AST", "ALT", "ALKPHOS", "BILITOT", "PROT", "ALBUMIN" in the  last 168 hours. No results for input(s): "LIPASE", "AMYLASE" in the last 168 hours. No results for input(s): "AMMONIA" in the last 168 hours. Coagulation profile No results for input(s): "INR", "PROTIME" in the last 168 hours.  CBC: Recent Labs  Lab 09/11/21 1945  WBC 12.4*  HGB 10.3*  HCT 30.7*  MCV 94.5  PLT 243   Cardiac Enzymes: No results for input(s): "CKTOTAL", "CKMB", "CKMBINDEX", "TROPONINI" in the last 168 hours. BNP (last 3 results) No results for input(s): "PROBNP" in the last 8760 hours. CBG: No results for input(s): "GLUCAP" in the last 168 hours. D-Dimer: No results for input(s): "DDIMER" in the last 72 hours. Hgb A1c: No results for input(s): "HGBA1C" in the last 72 hours. Lipid Profile: No results for input(s): "CHOL", "HDL", "LDLCALC", "TRIG", "CHOLHDL", "LDLDIRECT" in the last 72 hours. Thyroid function studies: No results for input(s): "TSH", "T4TOTAL", "T3FREE", "THYROIDAB" in the last 72 hours.  Invalid input(s): "FREET3" Anemia work up: No results for input(s): "VITAMINB12", "FOLATE", "FERRITIN", "TIBC", "IRON", "RETICCTPCT" in the last 72 hours. Sepsis Labs: Recent Labs  Lab 09/11/21 1945 09/11/21 2233 09/12/21 0208  WBC 12.4*  --   --   LATICACIDVEN  --  0.7 0.8    Microbiology Recent Results (from the past 240 hour(s))  SARS Coronavirus 2 by RT PCR (hospital order, performed in [facility] hospital lab) *cepheid single result test* Anterior Nasal Swab     Status: None   Collection Time: 09/11/21  8:11 PM   Specimen: Anterior Nasal Swab  Result Value Ref Range Status   SARS Coronavirus 2 by RT PCR NEGATIVE NEGATIVE Final    Comment: (NOTE) SARS-CoV-2 target nucleic acids are NOT DETECTED.  The SARS-CoV-2 RNA is generally detectable in upper and lower respiratory specimens during the acute phase of infection. The lowest concentration of SARS-CoV-2 viral copies this assay can detect is 250 copies / mL. A negative result does not preclude  SARS-CoV-2 infection and should not be used as the sole basis for treatment or other patient management decisions.  A negative result may occur with improper specimen collection / handling, submission of specimen other than nasopharyngeal swab, presence of viral mutation(s) within the areas targeted by this assay, and inadequate number of viral copies (<250 copies / mL). A negative result must be combined with clinical observations, patient history, and epidemiological information.  Fact Sheet for Patients:   https://www.patel.info/  Fact Sheet for Healthcare Providers: https://hall.com/  This test  is not yet approved or  cleared by the [place] and has been authorized for detection and/or diagnosis of SARS-CoV-2 by FDA under an Emergency Use Authorization (EUA).  This EUA will remain in effect (meaning this test can be used) for the duration of the COVID-19 declaration under Section 564(b)(1) of the Act, 21 U.S.C. section 360bbb-3(b)(1), unless the authorization is terminated or revoked sooner.  Performed at [facility], [address] [place], [place] [address]     Procedures and diagnostic studies:  CT Angio Chest PE W and/or Wo Contrast  Result Date: 09/11/2021 CLINICAL DATA:  Difficulty breathing EXAM: CT ANGIOGRAPHY CHEST WITH CONTRAST TECHNIQUE: Multidetector CT imaging of the chest was performed using the standard protocol during bolus administration of intravenous contrast. Multiplanar CT image reconstructions and MIPs were obtained to evaluate the vascular anatomy. RADIATION DOSE REDUCTION: This exam was performed according to the departmental dose-optimization program which includes automated exposure control, adjustment of the mA and/or kV according to patient size and/or use of iterative reconstruction technique. CONTRAST:  16m OMNIPAQUE IOHEXOL 350 MG/ML SOLN COMPARISON:  Chest x-ray from earlier in the same  day, CT from 06/07/2021. FINDINGS: Cardiovascular: Thoracic aorta demonstrates diffuse atherosclerotic calcifications without aneurysmal dilatation or dissection. No cardiac enlargement is noted. Scattered coronary calcifications are seen. The pulmonary artery shows a normal branching pattern. No filling defect to suggest pulmonary embolism is noted. Mediastinum/Nodes: Thoracic inlet is within normal limits. A 1 cm hypodense lesion in the left lobe of the thyroid is seen. No hilar or mediastinal adenopathy is noted. The esophagus is within normal limits. Lungs/Pleura: Some scarring is noted in the left lung base. Biapical pleural and parenchymal scarring is noted. Diffuse emphysematous changes are noted stable in appearance from the prior exam. Scarring is noted in the right lung unchanged from the prior study. Upper Abdomen: Visualized upper abdomen shows changes consistent with cholecystectomy. Musculoskeletal: Degenerative changes of the thoracic spine are noted. No acute rib abnormality is noted. Review of the MIP images confirms the above findings. IMPRESSION: No evidence of pulmonary embolism. Scarring in the lungs bilaterally. 1 cm incidental left thyroid nodule. No follow-up imaging is recommended. Reference: J Am Coll Radiol. 2015 Feb;12(2): 143-50 No other focal abnormality is noted. Aortic Atherosclerosis (ICD10-I70.0) and Emphysema (ICD10-J43.9). Electronically Signed   By: MInez CatalinaM.D.   On: 09/11/2021 22:12   DG Chest 2 View  Result Date: 09/11/2021 CLINICAL DATA:  Shortness of breath EXAM: CHEST - 2 VIEW COMPARISON:  02/24/2021, 04/28/2021, CT 06/07/2021 FINDINGS: Hyperinflation with emphysema. Pleural and parenchymal scarring at the apices. No acute airspace disease. Stable cardiomediastinal silhouette. Aortic atherosclerosis IMPRESSION: No active cardiopulmonary disease.  Hyperinflation and emphysema Electronically Signed   By: KDonavan FoilM.D.   On: 09/11/2021 20:19                LOS: 1 day   Jaicey Sweaney  [facility]   Pager on [email or web address] If 7PM-7AM, please contact night-coverage at [email or web address]     09/12/2021, 1:42 PM

## 2021-09-13 DIAGNOSIS — J9621 Acute and chronic respiratory failure with hypoxia: Secondary | ICD-10-CM

## 2021-09-13 DIAGNOSIS — J441 Chronic obstructive pulmonary disease with (acute) exacerbation: Secondary | ICD-10-CM | POA: Diagnosis not present

## 2021-09-13 LAB — BASIC METABOLIC PANEL
Anion gap: 4 — ABNORMAL LOW (ref 5–15)
BUN: 14 mg/dL (ref 8–23)
CO2: 26 mmol/L (ref 22–32)
Calcium: 9 mg/dL (ref 8.9–10.3)
Chloride: 104 mmol/L (ref 98–111)
Creatinine, Ser: 0.68 mg/dL (ref 0.44–1.00)
GFR, Estimated: >60 mL/min (ref >60)
Glucose, Bld: 116 mg/dL — ABNORMAL HIGH (ref 70–99)
Potassium: 4.6 mmol/L (ref 3.5–5.1)
Sodium: 134 mmol/L — ABNORMAL LOW (ref 135–145)

## 2021-09-13 MED ORDER — IPRATROPIUM-ALBUTEROL 0.5-2.5 (3) MG/3ML IN SOLN: 3.0000 mL | Freq: Four times a day (QID) | RESPIRATORY_TRACT | Status: DC | PRN

## 2021-09-13 MED ORDER — PREDNISONE 20 MG PO TABS
40.0000 mg | ORAL_TABLET | Freq: Every day | ORAL | 0 refills | Status: AC
Start: 2021-09-14 — End: 2021-09-17

## 2021-09-13 MED ORDER — GUAIFENESIN-DM 100-10 MG/5ML PO SYRP
5.0000 mL | ORAL_SOLUTION | ORAL | Status: DC | PRN
  Administered 2021-09-13: 5 mL via ORAL
  Filled 2021-09-13: qty 5

## 2021-09-13 NOTE — Plan of Care (Signed)
  Problem: Education: Goal: Understanding of CV disease, CV risk reduction, and recovery process will improve Outcome: Progressing Goal: Individualized Educational Video(s) Outcome: Progressing   Problem: Activity: Goal: Ability to return to baseline activity level will improve Outcome: Progressing   Problem: Health Behavior/Discharge Planning: Goal: Ability to safely manage health-related needs after discharge will improve Outcome: Progressing   Problem: Education: Goal: Knowledge of disease or condition will improve Outcome: Progressing Goal: Knowledge of the prescribed therapeutic regimen will improve Outcome: Progressing Goal: Individualized Educational Video(s) Outcome: Progressing

## 2021-09-13 NOTE — Progress Notes (Signed)
Discharge note: Pt given discharge instructions and verbalized understanding. Pt wheeled out with all valuables, as well as portable oxygen concentrator and an oxygen cylinder tank. Pt helped into car and portable oxygen concentrator connected to patient in car. Pt left via car driven by family member.

## 2021-09-13 NOTE — Progress Notes (Signed)
SATURATION QUALIFICATIONS:     Patient Saturations on Room Air at Rest = 94%  Patient Saturations on Room Air while Ambulating = 77%  Patient Saturations on 2 Liters of oxygen while Ambulating = 98%  Please briefly explain why patient needs home oxygen: desaturation in oxygen

## 2021-09-13 NOTE — Discharge Summary (Signed)
Physician Discharge Summary   Patient: Kristi Clark MRN: [id number] DOB: 11/27/1950  Admit date:     09/11/2021  Discharge date: 09/13/21  Discharge Physician: Jennye Boroughs   PCP: [name], DO   Recommendations at discharge:   Follow-up with PCP in 1 week Follow-up with Dr. Humphrey Rolls, cardiologist, for cardiac cath as scheduled on 09/14/2021  Discharge Diagnoses: Principal Problem:   Acute respiratory failure with hypoxemia ([place]) Active Problems:   COPD with acute exacerbation (HCC)   Hyponatremia   Chronic anemia   CAD (coronary artery disease)  Resolved Problems:   * No resolved hospital problems. [facility] Course:  Kristi Clark is a 71 y.o. female with medical history significant for Severe COPD but not on oxygen, ongoing tobacco use disorder, CAD s/p scheduled for cardiac cath on 7/11 for work-up of intermittent chest pain, interstitial lung disease followed by pulmonology, who presented to the hospital with generalized weakness, productive cough, shortness of breath, wheezing and poor oral intake.  Oxygen saturation dropped to 85% on room air.   She was admitted to the hospital for COPD exacerbation and acute hypoxic respiratory failure.  She was treated with steroids, bronchodilators and 2 L/min oxygen via nasal cannula.  Unfortunately, she could not be weaned off oxygen.  Oxygen saturation at rest on room air was 94%, 77% on room air while ambulating and 98% on 2 L while ambulating.  She will be discharged on 2 L/min oxygen via nasal cannula for chronic hypoxic respiratory failure.  She had hyponatremia which improved with IV fluids.  Her condition has improved and she is deemed medically stable for discharge to home today.        Consultants: None Procedures performed: None Disposition: Home Diet recommendation:  Discharge Diet Orders (From admission, onward)     Start     Ordered   09/13/21 0000  Diet - low sodium heart healthy        09/13/21  1205           Cardiac diet DISCHARGE MEDICATION: Allergies as of 09/13/2021       Reactions   Clindamycin/lincomycin Hives   Prednisone Other (See Comments)   Psychosis   Amoxicillin Rash   Avelox [moxifloxacin Hcl In Nacl] Rash   Codeine Sulfate Nausea Only   Penicillins Rash   Sulfa Antibiotics Rash        Medication List     STOP taking these medications    CALCIUM 600 PO       TAKE these medications    albuterol 108 (90 Base) MCG/ACT inhaler Commonly known as: VENTOLIN HFA Inhale 2 puffs into the lungs every 6 (six) hours as needed for wheezing or shortness of breath.   aspirin 81 MG tablet Take 81 mg by mouth daily.   atorvastatin 40 MG tablet Commonly known as: LIPITOR Take 1 tablet (40 mg total) by mouth daily.   Blood Pressure Monitor/Wrist Kit Take Blood pressure as needed, Dx: I12.9   CENTRUM SILVER ADULT 50+ PO Take 1 tablet by mouth daily.   PreserVision AREDS 2+Multi Vit Caps Take 1 capsule by mouth at bedtime.   clopidogrel 75 MG tablet Commonly known as: PLAVIX Take 1 tablet (75 mg total) by mouth daily.   famotidine 20 MG tablet Commonly known as: PEPCID Take 20 mg by mouth daily.   fluticasone 50 MCG/ACT nasal spray Commonly known as: FLONASE Place 2 sprays into both nostrils daily.   fluticasone-salmeterol 250-50 MCG/ACT Aepb  Commonly known as: Advair Diskus Inhale 1 puff into the lungs in the morning and at bedtime.   hydrALAZINE 25 MG tablet Commonly known as: APRESOLINE Take 1 tablet (25 mg total) by mouth 2 (two) times daily.   isosorbide mononitrate 60 MG 24 hr tablet Commonly known as: IMDUR Take 1 tablet (60 mg total) by mouth 2 (two) times daily.   losartan 50 MG tablet Commonly known as: COZAAR Take 0.5 tablets (25 mg total) by mouth daily.   metoprolol succinate 50 MG 24 hr tablet Commonly known as: TOPROL-XL Take 1 tablet (50 mg total) by mouth daily. Take with or immediately following a meal.    mirtazapine 15 MG tablet Commonly known as: REMERON Take 1 tablet (15 mg total) by mouth at bedtime.   nystatin 100000 UNIT/ML suspension Commonly known as: MYCOSTATIN Take 5 mLs (500,000 Units total) by mouth 4 (four) times daily.   ondansetron 4 MG tablet Commonly known as: Zofran Take 1 tablet (4 mg total) by mouth every 8 (eight) hours as needed for nausea or vomiting.   polyethylene glycol powder 17 GM/SCOOP powder Commonly known as: GLYCOLAX/MIRALAX Take 17 g by mouth daily as needed for moderate constipation.   predniSONE 20 MG tablet Commonly known as: DELTASONE Take 2 tablets (40 mg total) by mouth daily with breakfast for 3 days. Start taking on: September 14, 2021   PROBIOTIC-10 PO Take by mouth. Every other day   spironolactone 25 MG tablet Commonly known as: ALDACTONE Take 12.5 mg by mouth daily.   tiotropium 18 MCG inhalation capsule Commonly known as: SPIRIVA Place 1 capsule (18 mcg total) into inhaler and inhale daily.   ZYRTEC PO Take by mouth daily.        Discharge Exam: Filed Weights   09/11/21 1914  Weight: 41.7 kg   GEN: NAD SKIN: Warm and dry EYES: No pallor or icterus ENT: MMM CV: RRR PULM: CTA B ABD: soft, ND, NT, +BS CNS: AAO x 3, non focal EXT: No edema or tenderness   Condition at discharge: good  The results of significant diagnostics from this hospitalization (including imaging, microbiology, ancillary and laboratory) are listed below for reference.   Imaging Studies: CT Angio Chest PE W and/or Wo Contrast  Result Date: 09/11/2021 CLINICAL DATA:  Difficulty breathing EXAM: CT ANGIOGRAPHY CHEST WITH CONTRAST TECHNIQUE: Multidetector CT imaging of the chest was performed using the standard protocol during bolus administration of intravenous contrast. Multiplanar CT image reconstructions and MIPs were obtained to evaluate the vascular anatomy. RADIATION DOSE REDUCTION: This exam was performed according to the departmental  dose-optimization program which includes automated exposure control, adjustment of the mA and/or kV according to patient size and/or use of iterative reconstruction technique. CONTRAST:  76m OMNIPAQUE IOHEXOL 350 MG/ML SOLN COMPARISON:  Chest x-ray from earlier in the same day, CT from 06/07/2021. FINDINGS: Cardiovascular: Thoracic aorta demonstrates diffuse atherosclerotic calcifications without aneurysmal dilatation or dissection. No cardiac enlargement is noted. Scattered coronary calcifications are seen. The pulmonary artery shows a normal branching pattern. No filling defect to suggest pulmonary embolism is noted. Mediastinum/Nodes: Thoracic inlet is within normal limits. A 1 cm hypodense lesion in the left lobe of the thyroid is seen. No hilar or mediastinal adenopathy is noted. The esophagus is within normal limits. Lungs/Pleura: Some scarring is noted in the left lung base. Biapical pleural and parenchymal scarring is noted. Diffuse emphysematous changes are noted stable in appearance from the prior exam. Scarring is noted in the right lung unchanged from  the prior study. Upper Abdomen: Visualized upper abdomen shows changes consistent with cholecystectomy. Musculoskeletal: Degenerative changes of the thoracic spine are noted. No acute rib abnormality is noted. Review of the MIP images confirms the above findings. IMPRESSION: No evidence of pulmonary embolism. Scarring in the lungs bilaterally. 1 cm incidental left thyroid nodule. No follow-up imaging is recommended. Reference: J Am Coll Radiol. 2015 Feb;12(2): 143-50 No other focal abnormality is noted. Aortic Atherosclerosis (ICD10-I70.0) and Emphysema (ICD10-J43.9). Electronically Signed   By: Inez Catalina M.D.   On: 09/11/2021 22:12   DG Chest 2 View  Result Date: 09/11/2021 CLINICAL DATA:  Shortness of breath EXAM: CHEST - 2 VIEW COMPARISON:  02/24/2021, 04/28/2021, CT 06/07/2021 FINDINGS: Hyperinflation with emphysema. Pleural and parenchymal  scarring at the apices. No acute airspace disease. Stable cardiomediastinal silhouette. Aortic atherosclerosis IMPRESSION: No active cardiopulmonary disease.  Hyperinflation and emphysema Electronically Signed   By: Donavan Foil M.D.   On: 09/11/2021 20:19    Microbiology: Results for orders placed or performed during the hospital encounter of 09/11/21  SARS Coronavirus 2 by RT PCR (hospital order, performed in [facility] hospital lab) *cepheid single result test* Anterior Nasal Swab     Status: None   Collection Time: 09/11/21  8:11 PM   Specimen: Anterior Nasal Swab  Result Value Ref Range Status   SARS Coronavirus 2 by RT PCR NEGATIVE NEGATIVE Final    Comment: (NOTE) SARS-CoV-2 target nucleic acids are NOT DETECTED.  The SARS-CoV-2 RNA is generally detectable in upper and lower respiratory specimens during the acute phase of infection. The lowest concentration of SARS-CoV-2 viral copies this assay can detect is 250 copies / mL. A negative result does not preclude SARS-CoV-2 infection and should not be used as the sole basis for treatment or other patient management decisions.  A negative result may occur with improper specimen collection / handling, submission of specimen other than nasopharyngeal swab, presence of viral mutation(s) within the areas targeted by this assay, and inadequate number of viral copies (<250 copies / mL). A negative result must be combined with clinical observations, patient history, and epidemiological information.  Fact Sheet for Patients:   https://www.patel.info/  Fact Sheet for Healthcare Providers: https://hall.com/  This test is not yet approved or  cleared by the [place] FDA and has been authorized for detection and/or diagnosis of SARS-CoV-2 by FDA under an Emergency Use Authorization (EUA).  This EUA will remain in effect (meaning this test can be used) for the duration of the COVID-19  declaration under Section 564(b)(1) of the Act, 21 U.S.C. section 360bbb-3(b)(1), unless the authorization is terminated or revoked sooner.  Performed at [facility], [address] [place], [place] [address]     Labs: CBC: Recent Labs  Lab 09/11/21 1945  WBC 12.4*  HGB 10.3*  HCT 30.7*  MCV 94.5  PLT 431   Basic Metabolic Panel: Recent Labs  Lab 09/11/21 1945 09/13/21 0623  NA 129* 134*  K 4.2 4.6  CL 98 104  CO2 23 26  GLUCOSE 98 116*  BUN 18 14  CREATININE 0.98 0.68  CALCIUM 9.0 9.0   Liver Function Tests: No results for input(s): "AST", "ALT", "ALKPHOS", "BILITOT", "PROT", "ALBUMIN" in the last 168 hours. CBG: No results for input(s): "GLUCAP" in the last 168 hours.  Discharge time spent: greater than 30 minutes.  Signed: Jennye Boroughs, MD [facility] 09/13/2021

## 2021-09-13 NOTE — TOC Progression Note (Signed)
Transition of Care [facility]) - Progression Note    Patient Details  Name: Kristi Clark MRN: [id number] Date of Birth: 05/25/1950  [facility] CM/SW Contact  Eileen Stanford, LCSW Phone Number: 09/13/2021, 3:12 PM  Clinical Narrative:   02 ordered through [employer]. Will be delivered to bedside. RN aware.         Expected Discharge Plan and Services           Expected Discharge Date: 09/13/21                                     Social Determinants of Health (SDOH) Interventions    Readmission Risk Interventions     No data to display

## 2021-09-13 NOTE — Plan of Care (Signed)
  Problem: Education: Goal: Understanding of CV disease, CV risk reduction, and recovery process will improve Outcome: Progressing Goal: Individualized Educational Video(s) Outcome: Progressing   Problem: Activity: Goal: Ability to return to baseline activity level will improve Outcome: Progressing   Problem: Cardiovascular: Goal: Ability to achieve and maintain adequate cardiovascular perfusion will improve Outcome: Progressing Goal: Vascular access site(s) Level 0-1 will be maintained Outcome: Progressing   Problem: Health Behavior/Discharge Planning: Goal: Ability to safely manage health-related needs after discharge will improve Outcome: Progressing   Problem: Education: Goal: Knowledge of disease or condition will improve Outcome: Progressing Goal: Knowledge of the prescribed therapeutic regimen will improve Outcome: Progressing Goal: Individualized Educational Video(s) Outcome: Progressing   Problem: Activity: Goal: Ability to tolerate increased activity will improve Outcome: Progressing Goal: Will verbalize the importance of balancing activity with adequate rest periods Outcome: Progressing   Problem: Respiratory: Goal: Ability to maintain a clear airway will improve Outcome: Progressing Goal: Levels of oxygenation will improve Outcome: Progressing Goal: Ability to maintain adequate ventilation will improve Outcome: Progressing   Problem: Education: Goal: Knowledge of General Education information will improve Description: Including pain rating scale, medication(s)/side effects and non-pharmacologic comfort measures Outcome: Progressing   Problem: Health Behavior/Discharge Planning: Goal: Ability to manage health-related needs will improve Outcome: Progressing   Problem: Clinical Measurements: Goal: Ability to maintain clinical measurements within normal limits will improve Outcome: Progressing Goal: Will remain free from infection Outcome:  Progressing Goal: Diagnostic test results will improve Outcome: Progressing Goal: Respiratory complications will improve Outcome: Progressing Goal: Cardiovascular complication will be avoided Outcome: Progressing   Problem: Activity: Goal: Risk for activity intolerance will decrease Outcome: Progressing   Problem: Nutrition: Goal: Adequate nutrition will be maintained Outcome: Progressing   Problem: Coping: Goal: Level of anxiety will decrease Outcome: Progressing   Problem: Elimination: Goal: Will not experience complications related to bowel motility Outcome: Progressing Goal: Will not experience complications related to urinary retention Outcome: Progressing   Problem: Pain Managment: Goal: General experience of comfort will improve Outcome: Progressing   Problem: Safety: Goal: Ability to remain free from injury will improve Outcome: Progressing   Problem: Skin Integrity: Goal: Risk for impaired skin integrity will decrease Outcome: Progressing

## 2021-09-14 ENCOUNTER — Telehealth: Payer: Self-pay

## 2021-09-14 DIAGNOSIS — R079 Chest pain, unspecified: Secondary | ICD-10-CM

## 2021-09-14 NOTE — Telephone Encounter (Signed)
Transition Care Management Follow-up Telephone Call Date of discharge and from where: [facility] 09/13/21 How have you been since you were released from the hospital? Not to good  Any questions or concerns? No  Items Reviewed: Did the pt receive and understand the discharge instructions provided? Yes  Medications obtained and verified? Yes  Other? No  Any new allergies since your discharge? No  Dietary orders reviewed? Yes Do you have support at home? Yes   Home Care and Equipment/Supplies: Were home health services ordered? no If so, what is the name of the agency?   Has the agency set up a time to come to the patient's home? yes Were any new equipment or medical supplies ordered?  Yes: O2 What is the name of the medical supply agency? [employer]  Were you able to get the supplies/equipment? Need to reschedule a day  Do you have any questions related to the use of the equipment or supplies? No  Functional Questionnaire: (I = Independent and D = Dependent) ADLs: I  Bathing/Dressing- I  Meal Prep- I  Eating- I  Maintaining continence- I  Transferring/Ambulation- I  Managing Meds- I  Follow up appointments reviewed:  PCP Hospital f/u appt confirmed? Yes  Scheduled to see Dr Wynetta Emery  on 09/21/21 @ 2:20. [facility] f/u appt confirmed? Yes  Scheduled to see Cardiology  on 09/14/21 @ for heart cath per pt stated . Are transportation arrangements needed? No  If their condition worsens, is the pt aware to call PCP or go to the Emergency Dept.? Yes Was the patient provided with contact information for the PCP's office or ED? Yes Was to pt encouraged to call back with questions or concerns? Yes

## 2021-09-14 NOTE — Telephone Encounter (Signed)
Leave for Dr. Wynetta Emery review to see if okay for HFU day of scheduled visit.

## 2021-09-17 ENCOUNTER — Encounter: Payer: Self-pay | Admitting: Cardiovascular Disease

## 2021-09-17 ENCOUNTER — Encounter
Admission: RE | Disposition: A | Discharge status: Home / Self Care | Payer: Self-pay | Source: Home / Self Care | Attending: Cardiovascular Disease

## 2021-09-17 ENCOUNTER — Other Ambulatory Visit: Payer: Self-pay

## 2021-09-17 ENCOUNTER — Ambulatory Visit
Admission: RE | Admit: 2021-09-17 | Discharge: 2021-09-17 | Disposition: A | Discharge status: Home / Self Care | Payer: Medicare Other | Attending: Cardiovascular Disease | Admitting: Cardiovascular Disease

## 2021-09-17 ENCOUNTER — Ambulatory Visit: Payer: Medicare Other

## 2021-09-17 DIAGNOSIS — I2510 Atherosclerotic heart disease of native coronary artery without angina pectoris: Secondary | ICD-10-CM | POA: Diagnosis not present

## 2021-09-17 DIAGNOSIS — R079 Chest pain, unspecified: Secondary | ICD-10-CM | POA: Insufficient documentation

## 2021-09-17 DIAGNOSIS — I200 Unstable angina: Secondary | ICD-10-CM | POA: Diagnosis not present

## 2021-09-17 HISTORY — PX: LEFT HEART CATH AND CORONARY ANGIOGRAPHY: CATH118249

## 2021-09-17 SURGERY — LEFT HEART CATH AND CORONARY ANGIOGRAPHY
Anesthesia: Moderate Sedation

## 2021-09-17 MED ORDER — HEPARIN (PORCINE) IN NACL 1000-0.9 UT/500ML-% IV SOLN
INTRAVENOUS | Status: AC
  Filled 2021-09-17: qty 1000

## 2021-09-17 MED ORDER — MIDAZOLAM HCL 2 MG/2ML IJ SOLN
INTRAMUSCULAR | Status: AC
  Filled 2021-09-17: qty 2

## 2021-09-17 MED ORDER — HYDRALAZINE HCL 20 MG/ML IJ SOLN: 10.0000 mg | INTRAMUSCULAR | Status: DC | PRN

## 2021-09-17 MED ORDER — SODIUM CHLORIDE 0.9 % WEIGHT BASED INFUSION
3.0000 mL/kg/h | INTRAVENOUS | Status: DC
  Administered 2021-09-17: 3 mL/kg/h via INTRAVENOUS

## 2021-09-17 MED ORDER — LIDOCAINE HCL (PF) 1 % IJ SOLN
INTRAMUSCULAR | Status: DC | PRN
  Administered 2021-09-17: 20 mL via SUBCUTANEOUS

## 2021-09-17 MED ORDER — HEPARIN (PORCINE) IN NACL 1000-0.9 UT/500ML-% IV SOLN
INTRAVENOUS | Status: DC | PRN
  Administered 2021-09-17 (×2): 500 mL

## 2021-09-17 MED ORDER — LABETALOL HCL 5 MG/ML IV SOLN: 10.0000 mg | INTRAVENOUS | Status: DC | PRN

## 2021-09-17 MED ORDER — ONDANSETRON HCL 4 MG/2ML IJ SOLN: 4.0000 mg | Freq: Four times a day (QID) | INTRAMUSCULAR | Status: DC | PRN

## 2021-09-17 MED ORDER — MIDAZOLAM HCL 2 MG/2ML IJ SOLN
INTRAMUSCULAR | Status: DC | PRN
  Administered 2021-09-17: 1 mg via INTRAVENOUS

## 2021-09-17 MED ORDER — ASPIRIN 81 MG PO CHEW: 81.0000 mg | CHEWABLE_TABLET | ORAL | Status: DC

## 2021-09-17 MED ORDER — SODIUM CHLORIDE 0.9 % IV SOLN: 250.0000 mL | INTRAVENOUS | Status: DC | PRN

## 2021-09-17 MED ORDER — ACETAMINOPHEN 325 MG PO TABS: 650.0000 mg | ORAL_TABLET | ORAL | Status: DC | PRN

## 2021-09-17 MED ORDER — SODIUM CHLORIDE 0.9% FLUSH
3.0000 mL | Freq: Two times a day (BID) | INTRAVENOUS | Status: DC
Start: 2021-09-17 — End: 2021-09-17

## 2021-09-17 MED ORDER — SODIUM CHLORIDE 0.9% FLUSH: 3.0000 mL | INTRAVENOUS | Status: DC | PRN

## 2021-09-17 MED ORDER — FENTANYL CITRATE (PF) 100 MCG/2ML IJ SOLN
INTRAMUSCULAR | Status: AC
  Filled 2021-09-17: qty 2

## 2021-09-17 MED ORDER — FENTANYL CITRATE (PF) 100 MCG/2ML IJ SOLN
INTRAMUSCULAR | Status: DC | PRN
  Administered 2021-09-17: 25 ug via INTRAVENOUS

## 2021-09-17 MED ORDER — SODIUM CHLORIDE 0.9 % WEIGHT BASED INFUSION: 1.0000 mL/kg/h | INTRAVENOUS | Status: DC

## 2021-09-17 MED ORDER — LABETALOL HCL 5 MG/ML IV SOLN
INTRAVENOUS | Status: AC
  Filled 2021-09-17: qty 4

## 2021-09-17 MED ORDER — LABETALOL HCL 5 MG/ML IV SOLN
INTRAVENOUS | Status: DC | PRN
  Administered 2021-09-17: 10 mg via INTRAVENOUS

## 2021-09-17 MED ORDER — IOHEXOL 300 MG/ML  SOLN
INTRAMUSCULAR | Status: DC | PRN
  Administered 2021-09-17: 60 mL

## 2021-09-17 MED ORDER — LIDOCAINE HCL 1 % IJ SOLN
INTRAMUSCULAR | Status: AC
  Filled 2021-09-17: qty 20

## 2021-09-17 SURGICAL SUPPLY — 10 items
CATH INFINITI 5FR MULTPACK ANG (CATHETERS) ×1 IMPLANT
DEVICE CLOSURE MYNXGRIP 5F (Vascular Products) ×1 IMPLANT
NDL PERC 18GX7CM (NEEDLE) IMPLANT
NEEDLE PERC 18GX7CM (NEEDLE) ×2 IMPLANT
PACK CARDIAC CATH (CUSTOM PROCEDURE TRAY) ×2 IMPLANT
PROTECTION STATION PRESSURIZED (MISCELLANEOUS) ×2
SET ATX SIMPLICITY (MISCELLANEOUS) ×1 IMPLANT
SHEATH AVANTI 5FR X 11CM (SHEATH) ×1 IMPLANT
STATION PROTECTION PRESSURIZED (MISCELLANEOUS) IMPLANT
WIRE GUIDERIGHT .035X150 (WIRE) ×1 IMPLANT

## 2021-09-17 NOTE — Progress Notes (Signed)
Pt. States she has"little sores on the side of my mouth." States she gets "thrush" often bc she is on antibiotics "a lot." [employer]. [place] [facility] lab.

## 2021-09-19 NOTE — Assessment & Plan Note (Signed)
Has lost an additional 4lbs since her last visit. Will cut down on her losartan. Follow up with cardiology and get her cath. Await results. Will start mirtazapine to help with weight loss.

## 2021-09-20 ENCOUNTER — Encounter: Payer: Self-pay | Admitting: Cardiovascular Disease

## 2021-09-20 DIAGNOSIS — I2510 Atherosclerotic heart disease of native coronary artery without angina pectoris: Secondary | ICD-10-CM | POA: Diagnosis not present

## 2021-09-20 DIAGNOSIS — J449 Chronic obstructive pulmonary disease, unspecified: Secondary | ICD-10-CM | POA: Diagnosis not present

## 2021-09-20 DIAGNOSIS — Z9861 Coronary angioplasty status: Secondary | ICD-10-CM | POA: Diagnosis not present

## 2021-09-20 DIAGNOSIS — I10 Essential (primary) hypertension: Secondary | ICD-10-CM | POA: Diagnosis not present

## 2021-09-20 DIAGNOSIS — E782 Mixed hyperlipidemia: Secondary | ICD-10-CM | POA: Diagnosis not present

## 2021-09-20 DIAGNOSIS — I340 Nonrheumatic mitral (valve) insufficiency: Secondary | ICD-10-CM | POA: Diagnosis not present

## 2021-09-21 ENCOUNTER — Encounter: Payer: Self-pay | Admitting: Family Medicine

## 2021-09-21 ENCOUNTER — Ambulatory Visit (INDEPENDENT_AMBULATORY_CARE_PROVIDER_SITE_OTHER): Payer: Medicare Other | Admitting: Family Medicine

## 2021-09-21 VITALS — BP 113/59 | HR 77 | Temp 99.1°F | Wt 96.6 lb

## 2021-09-21 DIAGNOSIS — E871 Hypo-osmolality and hyponatremia: Secondary | ICD-10-CM | POA: Diagnosis not present

## 2021-09-21 DIAGNOSIS — E876 Hypokalemia: Secondary | ICD-10-CM

## 2021-09-21 DIAGNOSIS — R112 Nausea with vomiting, unspecified: Secondary | ICD-10-CM

## 2021-09-21 DIAGNOSIS — D649 Anemia, unspecified: Secondary | ICD-10-CM

## 2021-09-21 DIAGNOSIS — I952 Hypotension due to drugs: Secondary | ICD-10-CM

## 2021-09-21 DIAGNOSIS — J9611 Chronic respiratory failure with hypoxia: Secondary | ICD-10-CM | POA: Diagnosis not present

## 2021-09-21 DIAGNOSIS — K5900 Constipation, unspecified: Secondary | ICD-10-CM

## 2021-09-21 DIAGNOSIS — J441 Chronic obstructive pulmonary disease with (acute) exacerbation: Secondary | ICD-10-CM | POA: Diagnosis not present

## 2021-09-21 DIAGNOSIS — B370 Candidal stomatitis: Secondary | ICD-10-CM | POA: Diagnosis not present

## 2021-09-21 MED ORDER — NYSTATIN 100000 UNIT/ML MT SUSP: 5.0000 mL | Freq: Four times a day (QID) | OROMUCOSAL | 0 refills | Status: DC

## 2021-09-21 NOTE — Patient Instructions (Signed)
Take miralax '17mg'$  2x a day until you have a good poop

## 2021-09-21 NOTE — Progress Notes (Signed)
BP (!) 113/59   Pulse 77   Temp 99.1 F (37.3 C)   Wt 96 lb 9.6 oz (43.8 kg)   LMP  (LMP Unknown)   SpO2 94% Comment: patient has nailpolish  BMI 19.51 kg/m    Subjective:    Patient ID: Kristi Clark, female    DOB: Jul 22, 1950, 71 y.o.   MRN: [id number]  HPI: Kristi Clark is a 71 y.o. female  Chief Complaint  Patient presents with   Hospitalization Follow-up    Patient states she went to the hospital due to feeling very weak and tired. Patient was admitted due to her COPD. Patient is now on 2L of oxygen all day and night.    Constipation    Patient states she has not had a BM since last Thursday, and it was hard to use the bathroom.    Weight Loss    Patient states she is still vomiting, last time she threw up was this morning.    Mouth Lesions    Patient states she has fever blisters around her mouth. Patient states she is not sure if she has thrush.   Transition of Care Hospital Follow up.   Hospital/Facility:[facility] D/C Physician: Dr. Mal Misty D/C Date: 09/13/21  Records Requested: 09/21/21 Records Received: 09/21/21 Records Reviewed: 09/21/21  Diagnoses on Discharge:   Acute respiratory failure with hypoxemia [facility]) Active Problems:   COPD with acute exacerbation (HCC)   Hyponatremia   Chronic anemia   CAD (coronary artery disease)  Date of interactive Contact within 48 hours of discharge: 09/14/21 Contact was through: phone  Date of 7 day or 14 day face-to-face visit: 09/21/21   within 14 days  Outpatient Encounter Medications as of 09/21/2021  Medication Sig Note   albuterol (VENTOLIN HFA) 108 (90 Base) MCG/ACT inhaler Inhale 2 puffs into the lungs every 6 (six) hours as needed for wheezing or shortness of breath.    aspirin 81 MG tablet Take 81 mg by mouth daily.    atorvastatin (LIPITOR) 40 MG tablet Take 1 tablet (40 mg total) by mouth daily.    Blood Pressure Monitoring (BLOOD PRESSURE MONITOR/WRIST) KIT Take Blood pressure as needed, Dx: I12.9     Cetirizine HCl (ZYRTEC PO) Take by mouth daily.    clopidogrel (PLAVIX) 75 MG tablet Take 1 tablet (75 mg total) by mouth daily.    famotidine (PEPCID) 20 MG tablet Take 20 mg by mouth daily.    fluticasone-salmeterol (ADVAIR DISKUS) 250-50 MCG/ACT AEPB Inhale 1 puff into the lungs in the morning and at bedtime.    hydrALAZINE (APRESOLINE) 25 MG tablet Take 1 tablet (25 mg total) by mouth 2 (two) times daily.    isosorbide mononitrate (IMDUR) 60 MG 24 hr tablet Take 1 tablet (60 mg total) by mouth 2 (two) times daily.    losartan (COZAAR) 50 MG tablet Take 0.5 tablets (25 mg total) by mouth daily. 09/21/2021: .88m   metoprolol succinate (TOPROL-XL) 50 MG 24 hr tablet Take 1 tablet (50 mg total) by mouth daily. Take with or immediately following a meal.    mirtazapine (REMERON) 15 MG tablet Take 1 tablet (15 mg total) by mouth at bedtime. 09/12/2021: New RX. Not started.   Multiple Vitamins-Minerals (CENTRUM SILVER ADULT 50+ PO) Take 1 tablet by mouth daily.    Multiple Vitamins-Minerals (PRESERVISION AREDS 2+MULTI VIT) CAPS Take 1 capsule by mouth at bedtime.    nystatin (MYCOSTATIN) 100000 UNIT/ML suspension Take 5 mLs (500,000 Units total) by mouth 4 (  four) times daily.    ondansetron (ZOFRAN) 4 MG tablet Take 1 tablet (4 mg total) by mouth every 8 (eight) hours as needed for nausea or vomiting.    polyethylene glycol powder (GLYCOLAX/MIRALAX) 17 GM/SCOOP powder Take 17 g by mouth daily as needed for moderate constipation.    Probiotic Product (PROBIOTIC-10 PO) Take by mouth. Every other day    spironolactone (ALDACTONE) 25 MG tablet Take 12.5 mg by mouth daily.    tiotropium (SPIRIVA) 18 MCG inhalation capsule Place 1 capsule (18 mcg total) into inhaler and inhale daily.    [DISCONTINUED] nystatin (MYCOSTATIN) 100000 UNIT/ML suspension Take 5 mLs (500,000 Units total) by mouth 4 (four) times daily. 09/12/2021: Pt takes as needed.   fluticasone (FLONASE) 50 MCG/ACT nasal spray Place 2 sprays into  both nostrils daily. (Patient not taking: Reported on 09/17/2021)    Facility-Administered Encounter Medications as of 09/21/2021  Medication   sodium chloride flush (NS) 0.9 % injection 3 mL  Per Hospitalist:"  Kristi Clark is a 71 y.o. female with medical history significant for Severe COPD but not on oxygen, ongoing tobacco use disorder, CAD s/p scheduled for cardiac cath on 7/11 for work-up of intermittent chest pain, interstitial lung disease followed by pulmonology, who presented to the hospital with generalized weakness, productive cough, shortness of breath, wheezing and poor oral intake.  Oxygen saturation dropped to 85% on room air.   She was admitted to the hospital for COPD exacerbation and acute hypoxic respiratory failure.  She was treated with steroids, bronchodilators and 2 L/min oxygen via nasal cannula.  Unfortunately, she could not be weaned off oxygen.  Oxygen saturation at rest on room air was 94%, 77% on room air while ambulating and 98% on 2 L while ambulating.  She will be discharged on 2 L/min oxygen via nasal cannula for chronic hypoxic respiratory failure.  She had hyponatremia which improved with IV fluids.  Her condition has improved and she is deemed medically stable for discharge to home today."  Diagnostic Tests Reviewed:  CLINICAL DATA:  Shortness of breath   EXAM: CHEST - 2 VIEW   COMPARISON:  02/24/2021, 04/28/2021, CT 06/07/2021   FINDINGS: Hyperinflation with emphysema. Pleural and parenchymal scarring at the apices. No acute airspace disease. Stable cardiomediastinal silhouette. Aortic atherosclerosis   IMPRESSION: No active cardiopulmonary disease.  Hyperinflation and emphysema  CLINICAL DATA:  Difficulty breathing   EXAM: CT ANGIOGRAPHY CHEST WITH CONTRAST   TECHNIQUE: Multidetector CT imaging of the chest was performed using the standard protocol during bolus administration of intravenous contrast. Multiplanar CT image reconstructions  and MIPs were obtained to evaluate the vascular anatomy.   RADIATION DOSE REDUCTION: This exam was performed according to the departmental dose-optimization program which includes automated exposure control, adjustment of the mA and/or kV according to patient size and/or use of iterative reconstruction technique.   CONTRAST:  49m OMNIPAQUE IOHEXOL 350 MG/ML SOLN   COMPARISON:  Chest x-ray from earlier in the same day, CT from 06/07/2021.   FINDINGS: Cardiovascular: Thoracic aorta demonstrates diffuse atherosclerotic calcifications without aneurysmal dilatation or dissection. No cardiac enlargement is noted. Scattered coronary calcifications are seen. The pulmonary artery shows a normal branching pattern. No filling defect to suggest pulmonary embolism is noted.   Mediastinum/Nodes: Thoracic inlet is within normal limits. A 1 cm hypodense lesion in the left lobe of the thyroid is seen. No hilar or mediastinal adenopathy is noted. The esophagus is within normal limits.   Lungs/Pleura: Some scarring is noted  in the left lung base. Biapical pleural and parenchymal scarring is noted. Diffuse emphysematous changes are noted stable in appearance from the prior exam. Scarring is noted in the right lung unchanged from the prior study.   Upper Abdomen: Visualized upper abdomen shows changes consistent with cholecystectomy.   Musculoskeletal: Degenerative changes of the thoracic spine are noted. No acute rib abnormality is noted.   Review of the MIP images confirms the above findings.   IMPRESSION: No evidence of pulmonary embolism.   Scarring in the lungs bilaterally.   1 cm incidental left thyroid nodule. No follow-up imaging is recommended.   Reference: J Am Coll Radiol. 2015 Feb;12(2): 143-50   No other focal abnormality is noted.   Aortic Atherosclerosis (ICD10-I70.0) and Emphysema (ICD10-J43.9).  Disposition: Home  Consults: None  Discharge Instructions: Follow-up  with PCP in 1 week Follow-up with Dr. Humphrey Rolls, cardiologist, for cardiac cath as scheduled on 09/14/2021  Disease/illness Education: Discussed today  Home Health/Community Services Discussions/Referrals: In place  Establishment or re-establishment of referral orders for community resources:  In place  Discussion with other health care providers: N/A  Assessment and Support of treatment regimen adherence: Good  Appointments Coordinated with: Patient   Education for self-management, independent living, and ADLs:  Discussed today  Since getting out of the hospital, Rainn has been feeling better. She has continues to feel nauseous. She has been very constipated. She continues to be nauseous- but it has been better. Her appetite has also been better. She continues to be quite tired. No other concerns or complaints at this time.   She was started on oxygen while in the hospital and was not able to be weaned off. She has been on it since. She had her cath done with Dr. Humphrey Rolls on 09/14/21. It showed:   Mid Cx lesion is 55% stenosed.    Dist LAD lesion is 55% stenosed.    The left ventricular systolic function is normal.    LV end diastolic pressure is mildly elevated.   They are planning on medical treatment for moderate lesion in mid LCX and distal LAD with stent in mid RCA no disease, treat medically with dual antiplatelet therapy.   Relevant past medical, surgical, family and social history reviewed and updated as indicated. Interim medical history since our last visit reviewed. Allergies and medications reviewed and updated.  Review of Systems  Constitutional:  Positive for fatigue and unexpected weight change. Negative for activity change, appetite change, chills, diaphoresis and fever.  Respiratory:  Positive for shortness of breath. Negative for apnea, cough, choking, chest tightness, wheezing and stridor.   Cardiovascular: Negative.   Gastrointestinal:  Positive for constipation, nausea  and vomiting. Negative for abdominal distention, abdominal pain, anal bleeding, blood in stool, diarrhea and rectal pain.  Musculoskeletal: Negative.   Skin: Negative.   Neurological: Negative.   Psychiatric/Behavioral: Negative.      Per HPI unless specifically indicated above     Objective:    BP (!) 113/59   Pulse 77   Temp 99.1 F (37.3 C)   Wt 96 lb 9.6 oz (43.8 kg)   LMP  (LMP Unknown)   SpO2 94% Comment: patient has nailpolish  BMI 19.51 kg/m   Wt Readings from Last 3 Encounters:  09/21/21 96 lb 9.6 oz (43.8 kg)  09/11/21 92 lb (41.7 kg)  09/10/21 92 lb 6.4 oz (41.9 kg)    Physical Exam Vitals and nursing note reviewed.  Constitutional:      General: She is  not in acute distress.    Appearance: Normal appearance. She is ill-appearing. She is not toxic-appearing or diaphoretic.  HENT:     Head: Normocephalic and atraumatic.     Right Ear: External ear normal.     Left Ear: External ear normal.     Nose: Nose normal.     Mouth/Throat:     Mouth: Mucous membranes are moist.     Pharynx: Oropharynx is clear.  Eyes:     General: No scleral icterus.       Right eye: No discharge.        Left eye: No discharge.     Extraocular Movements: Extraocular movements intact.     Conjunctiva/sclera: Conjunctivae normal.     Pupils: Pupils are equal, round, and reactive to light.  Cardiovascular:     Rate and Rhythm: Normal rate and regular rhythm.     Pulses: Normal pulses.     Heart sounds: Normal heart sounds. No murmur heard.    No friction rub. No gallop.  Pulmonary:     Effort: Pulmonary effort is normal. No respiratory distress.     Breath sounds: Normal breath sounds. No stridor. No wheezing, rhonchi or rales.  Chest:     Chest wall: No tenderness.  Musculoskeletal:        General: Normal range of motion.     Cervical back: Normal range of motion and neck supple.  Skin:    General: Skin is warm and dry.     Capillary Refill: Capillary refill takes less than  2 seconds.     Coloration: Skin is not jaundiced or pale.     Findings: No bruising, erythema, lesion or rash.  Neurological:     General: No focal deficit present.     Mental Status: She is alert and oriented to person, place, and time. Mental status is at baseline.  Psychiatric:        Mood and Affect: Mood normal.        Behavior: Behavior normal.        Thought Content: Thought content normal.        Judgment: Judgment normal.     Results for orders placed or performed in visit on 48/18/56  Basic metabolic panel  Result Value Ref Range   Glucose 95 70 - 99 mg/dL   BUN 12 8 - 27 mg/dL   Creatinine, Ser 0.73 0.57 - 1.00 mg/dL   eGFR 87 >59 mL/min/1.73   BUN/Creatinine Ratio 16 12 - 28   Sodium 132 (L) 134 - 144 mmol/L   Potassium 4.9 3.5 - 5.2 mmol/L   Chloride 93 (L) 96 - 106 mmol/L   CO2 28 20 - 29 mmol/L   Calcium 9.2 8.7 - 10.3 mg/dL  CBC with Differential/Platelet  Result Value Ref Range   WBC 10.7 3.4 - 10.8 x10E3/uL   RBC 2.93 (L) 3.77 - 5.28 x10E6/uL   Hemoglobin 9.1 (L) 11.1 - 15.9 g/dL   Hematocrit 27.4 (L) 34.0 - 46.6 %   MCV 94 79 - 97 fL   MCH 31.1 26.6 - 33.0 pg   MCHC 33.2 31.5 - 35.7 g/dL   RDW 12.4 11.7 - 15.4 %   Platelets 391 150 - 450 x10E3/uL   Neutrophils 72 Not Estab. %   Lymphs 21 Not Estab. %   Monocytes 6 Not Estab. %   Eos 1 Not Estab. %   Basos 0 Not Estab. %   Neutrophils Absolute 7.7 (H) 1.4 -  7.0 x10E3/uL   Lymphocytes Absolute 2.2 0.7 - 3.1 x10E3/uL   Monocytes Absolute 0.7 0.1 - 0.9 x10E3/uL   EOS (ABSOLUTE) 0.1 0.0 - 0.4 x10E3/uL   Basophils Absolute 0.0 0.0 - 0.2 x10E3/uL   Immature Granulocytes 0 Not Estab. %   Immature Grans (Abs) 0.0 0.0 - 0.1 x10E3/uL      Assessment & Plan:   Problem List Items Addressed This Visit       Respiratory   COPD with acute exacerbation ([place])    Resolved. Continue oxygen. Continue inhalers. Continue to follow with pulmonology.      Chronic respiratory failure with hypoxia (HCC) - Primary     Doing much better on oxygen. Continue oxygen. Follow up with pulmonology. Call with any concerns.         Digestive   Nausea and vomiting    Has improved slightly with higher BP. Will continue to monitor for another week, if not improved with oxygen, higher BP and decreased constipation will get her into GI.         Other   Hyponatremia   Relevant Orders   Basic metabolic panel (Completed)   Hypokalemia    Rechecking labs today. Await results. Treat as needed.       Other Visit Diagnoses     Hypotension due to drugs       Improved. Continue to monitor. Call with any concerns.    Constipation, unspecified constipation type       Will treat with miralax BID until has a good bowel movement. Continue to work on eating better. If not better in 1 week will get her into GI.    Anemia, unspecified type       Rechecking labs today. Await results. Treat as needed.    Relevant Orders   CBC with Differential/Platelet (Completed)   Thrush       Will treat with nystatin. Call if not getting better or getting worse.    Relevant Medications   nystatin (MYCOSTATIN) 100000 UNIT/ML suspension        Follow up plan: Return in about 2 weeks (around 10/05/2021).   >30 minutes spent with patient today.

## 2021-09-22 ENCOUNTER — Other Ambulatory Visit: Payer: Self-pay

## 2021-09-22 DIAGNOSIS — J9611 Chronic respiratory failure with hypoxia: Secondary | ICD-10-CM

## 2021-09-22 LAB — CBC WITH DIFFERENTIAL/PLATELET
Basophils Absolute: 0 10*3/uL (ref 0.0–0.2)
Basos: 0 %
EOS (ABSOLUTE): 0.1 10*3/uL (ref 0.0–0.4)
Eos: 1 %
Hematocrit: 27.4 % — ABNORMAL LOW (ref 34.0–46.6)
Hemoglobin: 9.1 g/dL — ABNORMAL LOW (ref 11.1–15.9)
Immature Grans (Abs): 0 10*3/uL (ref 0.0–0.1)
Immature Granulocytes: 0 %
Lymphocytes Absolute: 2.2 10*3/uL (ref 0.7–3.1)
Lymphs: 21 %
MCH: 31.1 pg (ref 26.6–33.0)
MCHC: 33.2 g/dL (ref 31.5–35.7)
MCV: 94 fL (ref 79–97)
Monocytes Absolute: 0.7 10*3/uL (ref 0.1–0.9)
Monocytes: 6 %
Neutrophils Absolute: 7.7 10*3/uL — ABNORMAL HIGH (ref 1.4–7.0)
Neutrophils: 72 %
Platelets: 391 10*3/uL (ref 150–450)
RBC: 2.93 x10E6/uL — ABNORMAL LOW (ref 3.77–5.28)
RDW: 12.4 % (ref 11.7–15.4)
WBC: 10.7 10*3/uL (ref 3.4–10.8)

## 2021-09-22 LAB — BASIC METABOLIC PANEL
BUN/Creatinine Ratio: 16 (ref 12–28)
BUN: 12 mg/dL (ref 8–27)
CO2: 28 mmol/L (ref 20–29)
Calcium: 9.2 mg/dL (ref 8.7–10.3)
Chloride: 93 mmol/L — ABNORMAL LOW (ref 96–106)
Creatinine, Ser: 0.73 mg/dL (ref 0.57–1.00)
Glucose: 95 mg/dL (ref 70–99)
Potassium: 4.9 mmol/L (ref 3.5–5.2)
Sodium: 132 mmol/L — ABNORMAL LOW (ref 134–144)
eGFR: 87 mL/min/{1.73_m2} (ref >59)

## 2021-09-22 MED ORDER — HUMIDIFIER MISC: 0 refills | Status: DC

## 2021-09-22 NOTE — Progress Notes (Signed)
error 

## 2021-09-23 ENCOUNTER — Ambulatory Visit: Payer: Medicare Other | Admitting: Dermatology

## 2021-09-23 NOTE — Assessment & Plan Note (Signed)
Resolved. Continue oxygen. Continue inhalers. Continue to follow with pulmonology.

## 2021-09-23 NOTE — Assessment & Plan Note (Signed)
Rechecking labs today. Await results. Treat as needed.  °

## 2021-09-23 NOTE — Assessment & Plan Note (Signed)
Doing much better on oxygen. Continue oxygen. Follow up with pulmonology. Call with any concerns.

## 2021-09-23 NOTE — Assessment & Plan Note (Signed)
Has improved slightly with higher BP. Will continue to monitor for another week, if not improved with oxygen, higher BP and decreased constipation will get her into GI.

## 2021-09-24 ENCOUNTER — Ambulatory Visit: Payer: Self-pay | Admitting: *Deleted

## 2021-09-24 NOTE — Telephone Encounter (Signed)
Reason for Disposition . [1] Continued weight loss AND [2] after medical evaluation by doctor (or NP/PA)  Protocols used: Weight Loss - Unintended-A-AH

## 2021-09-24 NOTE — Telephone Encounter (Addendum)
Summary: loss of appetite   Patient states she has no appetite and has lost 4 lbs since seeing provider on Tuesday   Patient states due to being sick on her stomach she is unable to eat   Patient inquiring what liquid meal replacements she can drink to gain her strength back   Patient states she has head congestion and inquiring if provider can order a moisturizer component for her oxygen machine and nebulizer   Please fu w/ patient       Chief Complaint: loosing wt Symptoms: not eating Frequency: always Pertinent Negatives: Patient denies na Disposition: '[]'$ ED /'[]'$ Urgent Care (no appt availability in office) / '[x]'$ Appointment(In office/virtual)/ '[]'$  [facility]/ '[]'$ Home Care/ '[]'$ Refused Recommended Disposition /'[]'$ [facility]/ '[]'$  Follow-up with PCP Additional Notes: just seen and has upcoming appt. Nutritional information given to pt. Pt requesting an apparatus from [facility] in [place] for moisture in her oxygen. Pt would also like a nebulizer. They talked about it while at hospital but did not get her one for home.    Answer Assessment - Initial Assessment Questions 1. MAIN CONCERN: "What is your main concern today?"     Weight loss 2. WEIGHT LOSS: "How much weight have you lost?"  (e.g., lbs., kgs.)  "Over what period of time have you lost this weight?"  (e.g., number of days, weeks, months, years)     4 pounds this week 3. BASELINE WEIGHT: "What is your baseline or normal weight?" (e.g., "How much do you usually weigh?")     Nauseated and can not eat 4. CAUSE: "What do you think is causing the weight loss?" (e.g., depression, anxiety, medicine side effect, pain, trouble swallowing, substance or alcohol use problem, eating disorder)     Not enough eating 5. PRIOR EVALUATION: "Have you been evaluated by a doctor for your weight loss?" If Yes, ask "When was your last visit?" "What did your doctor (or NP/PA) tell you about the possible  cause?"     Just saw MD and has upcoming appt. 6. HEART FAILURE TREATMENT: "Do you have heart failure?" If Yes, ask: "Have you taken new or extra water pills (diuretics) recently?" (e.g., furosemide; bumetanide). "What is your target weight?"     na 7. OTHER SYMPTOMS: "Do you have any other symptoms?" (e.g., anxiety or depression, blood in stool, breathing difficulty, diarrhea, fever, trouble swallowing)     Taking miralax 8. PREGNANCY: "Is there any chance you are pregnant?" "When was your last menstrual period?"     Na  Protocols used: Weight Loss - Unintended-A-AH

## 2021-09-28 ENCOUNTER — Encounter: Payer: Self-pay | Admitting: Family Medicine

## 2021-09-29 ENCOUNTER — Other Ambulatory Visit: Payer: Self-pay | Admitting: Family Medicine

## 2021-09-29 MED ORDER — HYDRALAZINE HCL 25 MG PO TABS: 12.5000 mg | ORAL_TABLET | Freq: Two times a day (BID) | ORAL | 1 refills | Status: DC

## 2021-10-05 ENCOUNTER — Encounter: Payer: Self-pay | Admitting: Family Medicine

## 2021-10-05 ENCOUNTER — Ambulatory Visit (INDEPENDENT_AMBULATORY_CARE_PROVIDER_SITE_OTHER): Payer: Medicare Other | Admitting: Family Medicine

## 2021-10-05 VITALS — BP 127/57 | HR 69 | Temp 98.0°F | Wt 93.3 lb

## 2021-10-05 DIAGNOSIS — Z9981 Dependence on supplemental oxygen: Secondary | ICD-10-CM

## 2021-10-05 DIAGNOSIS — I952 Hypotension due to drugs: Secondary | ICD-10-CM | POA: Diagnosis not present

## 2021-10-05 DIAGNOSIS — N898 Other specified noninflammatory disorders of vagina: Secondary | ICD-10-CM

## 2021-10-05 DIAGNOSIS — R112 Nausea with vomiting, unspecified: Secondary | ICD-10-CM | POA: Diagnosis not present

## 2021-10-05 LAB — WET PREP FOR TRICH, YEAST, CLUE
Clue Cell Exam: NEGATIVE
Trichomonas Exam: NEGATIVE
Yeast Exam: NEGATIVE

## 2021-10-05 LAB — URINALYSIS, ROUTINE W REFLEX MICROSCOPIC
Bilirubin, UA: NEGATIVE
Glucose, UA: NEGATIVE
Ketones, UA: NEGATIVE
Leukocytes,UA: NEGATIVE
Nitrite, UA: NEGATIVE
Protein,UA: NEGATIVE
RBC, UA: NEGATIVE
Specific Gravity, UA: 1.015 (ref 1.005–1.030)
Urobilinogen, Ur: 0.2 mg/dL (ref 0.2–1.0)
pH, UA: 6 (ref 5.0–7.5)

## 2021-10-05 MED ORDER — HYDRALAZINE HCL 25 MG PO TABS: 12.5000 mg | ORAL_TABLET | Freq: Two times a day (BID) | ORAL | 1 refills | Status: DC

## 2021-10-05 MED ORDER — LOSARTAN POTASSIUM 25 MG PO TABS
25.0000 mg | ORAL_TABLET | Freq: Every day | ORAL | 1 refills | Status: DC
Start: 2021-10-05 — End: 2021-10-12

## 2021-10-05 MED ORDER — MIRTAZAPINE 15 MG PO TABS: 15.0000 mg | ORAL_TABLET | Freq: Every day | ORAL | 1 refills | Status: DC

## 2021-10-05 NOTE — Assessment & Plan Note (Signed)
Resolved with better BP. Continue to monitor.

## 2021-10-05 NOTE — Patient Instructions (Addendum)
STOP Spironalactone.  Continue:  '50mg'$  metoprololol 12.'5mg'$  hydralazine BID '60mg'$  imdur BID '25mg'$  losartan daily

## 2021-10-05 NOTE — Progress Notes (Signed)
BP (!) 127/57   Pulse 69   Temp 98 F (36.7 C)   Wt 93 lb 4.8 oz (42.3 kg)   LMP  (LMP Unknown)   SpO2 99%   BMI 18.84 kg/m    Subjective:    Patient ID: Kristi Clark, female    DOB: Oct 23, 1950, 71 y.o.   MRN: [id number]  HPI: Kristi Clark is a 71 y.o. female  Chief Complaint  Patient presents with   Nausea    Patient here to follow up nausea and vomiting. Patient states that has gotten better.    Hypotension    Patient states the bottom number of her BP has been running in the 40's. Did cut hydralazine in half.    oxygen use    Patient states she uses her oxygen off and on    HYPOTENSION Hypertension status:  overtreated   Satisfied with current treatment? no Duration of hypertension: chronic BP monitoring frequency:  a few times a week BP range: 110s-120s/40s-50s BP medication side effects:  yes Medication compliance: excellent compliance Previous BP meds:spironalactone, metoprolol, hydralazine, imdur, losartan Aspirin: yes Recurrent headaches: no Visual changes: no Palpitations: no Dyspnea: no Chest pain: no Lower extremity edema: no Dizzy/lightheaded: yes  VAGINAL DISCHARGE Duration: weeks Discharge description: cottage cheese  Pruritus: yes Dysuria: yes Malodorous: no Urinary frequency: no Fevers: no Abdominal pain: no  Sexual activity: not sexually active History of sexually transmitted diseases: no Recent antibiotic use: no Context: stable  Treatments attempted: none   Relevant past medical, surgical, family and social history reviewed and updated as indicated. Interim medical history since our last visit reviewed. Allergies and medications reviewed and updated.  Review of Systems  Constitutional: Negative.   Respiratory: Negative.    Cardiovascular: Negative.   Gastrointestinal: Negative.   Neurological: Negative.   Psychiatric/Behavioral: Negative.      Per HPI unless specifically indicated above     Objective:    BP  (!) 127/57   Pulse 69   Temp 98 F (36.7 C)   Wt 93 lb 4.8 oz (42.3 kg)   LMP  (LMP Unknown)   SpO2 99%   BMI 18.84 kg/m   Wt Readings from Last 3 Encounters:  10/05/21 93 lb 4.8 oz (42.3 kg)  09/21/21 96 lb 9.6 oz (43.8 kg)  09/11/21 92 lb (41.7 kg)    Physical Exam Vitals and nursing note reviewed.  Constitutional:      General: She is not in acute distress.    Appearance: Normal appearance. She is normal weight. She is not ill-appearing, toxic-appearing or diaphoretic.  HENT:     Head: Normocephalic and atraumatic.     Right Ear: External ear normal.     Left Ear: External ear normal.     Nose: Nose normal.     Mouth/Throat:     Mouth: Mucous membranes are moist.     Pharynx: Oropharynx is clear.  Eyes:     General: No scleral icterus.       Right eye: No discharge.        Left eye: No discharge.     Extraocular Movements: Extraocular movements intact.     Conjunctiva/sclera: Conjunctivae normal.     Pupils: Pupils are equal, round, and reactive to light.  Cardiovascular:     Rate and Rhythm: Normal rate and regular rhythm.     Pulses: Normal pulses.     Heart sounds: Normal heart sounds. No murmur heard.    No friction rub.  No gallop.  Pulmonary:     Effort: Pulmonary effort is normal. No respiratory distress.     Breath sounds: Normal breath sounds. No stridor. No wheezing, rhonchi or rales.  Chest:     Chest wall: No tenderness.  Musculoskeletal:        General: Normal range of motion.     Cervical back: Normal range of motion and neck supple.  Skin:    General: Skin is warm and dry.     Capillary Refill: Capillary refill takes less than 2 seconds.     Coloration: Skin is not jaundiced or pale.     Findings: No bruising, erythema, lesion or rash.  Neurological:     General: No focal deficit present.     Mental Status: She is alert and oriented to person, place, and time. Mental status is at baseline.  Psychiatric:        Mood and Affect: Mood normal.         Behavior: Behavior normal.        Thought Content: Thought content normal.        Judgment: Judgment normal.     Results for orders placed or performed in visit on 34/28/76  Basic metabolic panel  Result Value Ref Range   Glucose 95 70 - 99 mg/dL   BUN 12 8 - 27 mg/dL   Creatinine, Ser 0.73 0.57 - 1.00 mg/dL   eGFR 87 >59 mL/min/1.73   BUN/Creatinine Ratio 16 12 - 28   Sodium 132 (L) 134 - 144 mmol/L   Potassium 4.9 3.5 - 5.2 mmol/L   Chloride 93 (L) 96 - 106 mmol/L   CO2 28 20 - 29 mmol/L   Calcium 9.2 8.7 - 10.3 mg/dL  CBC with Differential/Platelet  Result Value Ref Range   WBC 10.7 3.4 - 10.8 x10E3/uL   RBC 2.93 (L) 3.77 - 5.28 x10E6/uL   Hemoglobin 9.1 (L) 11.1 - 15.9 g/dL   Hematocrit 27.4 (L) 34.0 - 46.6 %   MCV 94 79 - 97 fL   MCH 31.1 26.6 - 33.0 pg   MCHC 33.2 31.5 - 35.7 g/dL   RDW 12.4 11.7 - 15.4 %   Platelets 391 150 - 450 x10E3/uL   Neutrophils 72 Not Estab. %   Lymphs 21 Not Estab. %   Monocytes 6 Not Estab. %   Eos 1 Not Estab. %   Basos 0 Not Estab. %   Neutrophils Absolute 7.7 (H) 1.4 - 7.0 x10E3/uL   Lymphocytes Absolute 2.2 0.7 - 3.1 x10E3/uL   Monocytes Absolute 0.7 0.1 - 0.9 x10E3/uL   EOS (ABSOLUTE) 0.1 0.0 - 0.4 x10E3/uL   Basophils Absolute 0.0 0.0 - 0.2 x10E3/uL   Immature Granulocytes 0 Not Estab. %   Immature Grans (Abs) 0.0 0.0 - 0.1 x10E3/uL      Assessment & Plan:   Problem List Items Addressed This Visit       Digestive   Nausea and vomiting    Resolved with better BP. Continue to monitor.       Other Visit Diagnoses     Hypotension due to drugs    -  Primary   Still over treated. Will stop spironalactone and recheck 2 weeks with BMP. Continue to monitor.    Relevant Medications   hydrALAZINE (APRESOLINE) 25 MG tablet   losartan (COZAAR) 25 MG tablet   Vaginal irritation       Checking UA and wet prep today. Treat as needed.  Relevant Orders   WET PREP FOR TRICH, YEAST, CLUE   Urinalysis, Routine w reflex  microscopic   Oxygen dependent       Doing well off oxygen. Will d/c oxygen.         Follow up plan: Return in about 2 weeks (around 10/19/2021).

## 2021-10-08 ENCOUNTER — Encounter: Payer: Self-pay | Admitting: Family Medicine

## 2021-10-11 NOTE — Telephone Encounter (Signed)
She's asking for the losartan right? Not the imdur?

## 2021-10-12 ENCOUNTER — Other Ambulatory Visit: Payer: Self-pay

## 2021-10-12 MED ORDER — LOSARTAN POTASSIUM 25 MG PO TABS: 25.0000 mg | ORAL_TABLET | Freq: Every day | ORAL | 1 refills | Status: DC

## 2021-10-14 DIAGNOSIS — Z8709 Personal history of other diseases of the respiratory system: Secondary | ICD-10-CM | POA: Diagnosis not present

## 2021-10-14 DIAGNOSIS — J449 Chronic obstructive pulmonary disease, unspecified: Secondary | ICD-10-CM | POA: Diagnosis not present

## 2021-10-14 DIAGNOSIS — Z9981 Dependence on supplemental oxygen: Secondary | ICD-10-CM | POA: Diagnosis not present

## 2021-10-14 DIAGNOSIS — J984 Other disorders of lung: Secondary | ICD-10-CM | POA: Diagnosis not present

## 2021-10-21 ENCOUNTER — Ambulatory Visit (INDEPENDENT_AMBULATORY_CARE_PROVIDER_SITE_OTHER): Payer: Medicare Other | Admitting: Family Medicine

## 2021-10-21 ENCOUNTER — Encounter: Payer: Self-pay | Admitting: Family Medicine

## 2021-10-21 VITALS — BP 125/56 | HR 98 | Temp 98.7°F | Wt 90.2 lb

## 2021-10-21 DIAGNOSIS — R634 Abnormal weight loss: Secondary | ICD-10-CM

## 2021-10-21 DIAGNOSIS — I952 Hypotension due to drugs: Secondary | ICD-10-CM

## 2021-10-21 MED ORDER — MIRTAZAPINE 30 MG PO TABS: 30.0000 mg | ORAL_TABLET | Freq: Every day | ORAL | 1 refills | Status: DC

## 2021-10-21 NOTE — Progress Notes (Signed)
BP (!) 125/56   Pulse 98   Temp 98.7 F (37.1 C)   Wt 90 lb 3.2 oz (40.9 kg)   LMP  (LMP Unknown)   SpO2 97%   BMI 18.22 kg/m    Subjective:    Patient ID: Kristi Clark, female    DOB: [birth year], 71 y.o.   MRN: [id number]  HPI: Kristi Clark is a 71 y.o. female  Chief Complaint  Patient presents with   Hypotension   HYPOTENSION Hypertension status: better  Satisfied with current treatment? yes Duration of hypertension: chronic BP monitoring frequency:  rarely BP medication side effects:  no Medication compliance: excellent compliance Aspirin: no Recurrent headaches: no Visual changes: no Palpitations: no Dyspnea: yes Chest pain: no Lower extremity edema: no Dizzy/lightheaded: no  Relevant past medical, surgical, family and social history reviewed and updated as indicated. Interim medical history since our last visit reviewed. Allergies and medications reviewed and updated.  Review of Systems  Constitutional:  Positive for unexpected weight change. Negative for activity change, appetite change, chills, diaphoresis, fatigue and fever.  Respiratory: Negative.    Cardiovascular: Negative.   Gastrointestinal: Negative.   Musculoskeletal: Negative.   Neurological: Negative.   Psychiatric/Behavioral: Negative.      Per HPI unless specifically indicated above     Objective:    BP (!) 125/56   Pulse 98   Temp 98.7 F (37.1 C)   Wt 90 lb 3.2 oz (40.9 kg)   LMP  (LMP Unknown)   SpO2 97%   BMI 18.22 kg/m   Wt Readings from Last 3 Encounters:  10/21/21 90 lb 3.2 oz (40.9 kg)  10/05/21 93 lb 4.8 oz (42.3 kg)  09/21/21 96 lb 9.6 oz (43.8 kg)    Physical Exam Vitals and nursing note reviewed.  Constitutional:      General: She is not in acute distress.    Appearance: Normal appearance. She is normal weight. She is not ill-appearing, toxic-appearing or diaphoretic.  HENT:     Head: Normocephalic and atraumatic.     Right Ear: External ear  normal.     Left Ear: External ear normal.     Nose: Nose normal.     Mouth/Throat:     Mouth: Mucous membranes are moist.     Pharynx: Oropharynx is clear.  Eyes:     General: No scleral icterus.       Right eye: No discharge.        Left eye: No discharge.     Extraocular Movements: Extraocular movements intact.     Conjunctiva/sclera: Conjunctivae normal.     Pupils: Pupils are equal, round, and reactive to light.  Cardiovascular:     Rate and Rhythm: Normal rate and regular rhythm.     Pulses: Normal pulses.     Heart sounds: Normal heart sounds. No murmur heard.    No friction rub. No gallop.  Pulmonary:     Effort: Pulmonary effort is normal. No respiratory distress.     Breath sounds: Normal breath sounds. No stridor. No wheezing, rhonchi or rales.  Chest:     Chest wall: No tenderness.  Musculoskeletal:        General: Normal range of motion.     Cervical back: Normal range of motion and neck supple.  Skin:    General: Skin is warm and dry.     Capillary Refill: Capillary refill takes less than 2 seconds.     Coloration: Skin is not jaundiced or  pale.     Findings: No bruising, erythema, lesion or rash.  Neurological:     General: No focal deficit present.     Mental Status: She is alert and oriented to person, place, and time. Mental status is at baseline.  Psychiatric:        Mood and Affect: Mood normal.        Behavior: Behavior normal.        Thought Content: Thought content normal.        Judgment: Judgment normal.     Results for orders placed or performed in visit on 10/05/21  WET PREP FOR [place], YEAST, CLUE   Specimen: Urine   Urine  Result Value Ref Range   Trichomonas Exam Negative Negative   Yeast Exam Negative Negative   Clue Cell Exam Negative Negative  Urinalysis, Routine w reflex microscopic  Result Value Ref Range   Specific Gravity, UA 1.015 1.005 - 1.030   pH, UA 6.0 5.0 - 7.5   Color, UA Yellow Yellow   Appearance Ur Clear Clear    Leukocytes,UA Negative Negative   Protein,UA Negative Negative/Trace   Glucose, UA Negative Negative   Ketones, UA Negative Negative   RBC, UA Negative Negative   Bilirubin, UA Negative Negative   Urobilinogen, Ur 0.2 0.2 - 1.0 mg/dL   Nitrite, UA Negative Negative      Assessment & Plan:   Problem List Items Addressed This Visit   None Visit Diagnoses     Hypotension due to drugs    -  Primary   Doing better. Feeling less tired. Continue current regimen. Continue to monitor.    Weight loss       Will increase her remeron to '30mg'$  and recheck 6 weeks. Call with any concerns.         Follow up plan: Return in about 6 weeks (around 12/02/2021).

## 2021-10-26 ENCOUNTER — Other Ambulatory Visit: Payer: Self-pay | Admitting: Family Medicine

## 2021-10-26 DIAGNOSIS — Z1231 Encounter for screening mammogram for malignant neoplasm of breast: Secondary | ICD-10-CM

## 2021-10-31 ENCOUNTER — Encounter: Payer: Self-pay | Admitting: Family Medicine

## 2021-11-01 NOTE — Telephone Encounter (Signed)
Yes please

## 2021-11-01 NOTE — Telephone Encounter (Signed)
Pt scheduled an appointment for tomorrow

## 2021-11-02 ENCOUNTER — Ambulatory Visit
Admission: RE | Admit: 2021-11-02 | Discharge: 2021-11-02 | Disposition: A | Discharge status: Home / Self Care | Payer: Medicare Other | Attending: Family Medicine | Admitting: Family Medicine

## 2021-11-02 ENCOUNTER — Encounter: Payer: Self-pay | Admitting: Family Medicine

## 2021-11-02 ENCOUNTER — Ambulatory Visit (INDEPENDENT_AMBULATORY_CARE_PROVIDER_SITE_OTHER): Payer: Medicare Other | Admitting: Family Medicine

## 2021-11-02 ENCOUNTER — Ambulatory Visit
Admission: RE | Admit: 2021-11-02 | Discharge: 2021-11-02 | Disposition: A | Discharge status: Home / Self Care | Payer: Medicare Other | Source: Ambulatory Visit | Attending: Family Medicine | Admitting: Family Medicine

## 2021-11-02 VITALS — BP 162/61 | HR 86 | Temp 98.4°F | Wt 92.3 lb

## 2021-11-02 DIAGNOSIS — J441 Chronic obstructive pulmonary disease with (acute) exacerbation: Secondary | ICD-10-CM | POA: Diagnosis not present

## 2021-11-02 DIAGNOSIS — B370 Candidal stomatitis: Secondary | ICD-10-CM | POA: Diagnosis not present

## 2021-11-02 DIAGNOSIS — R062 Wheezing: Secondary | ICD-10-CM

## 2021-11-02 DIAGNOSIS — I700 Atherosclerosis of aorta: Secondary | ICD-10-CM | POA: Diagnosis not present

## 2021-11-02 DIAGNOSIS — R079 Chest pain, unspecified: Secondary | ICD-10-CM | POA: Diagnosis not present

## 2021-11-02 DIAGNOSIS — R059 Cough, unspecified: Secondary | ICD-10-CM | POA: Diagnosis not present

## 2021-11-02 MED ORDER — ALBUTEROL SULFATE (2.5 MG/3ML) 0.083% IN NEBU
2.5000 mg | INHALATION_SOLUTION | Freq: Once | RESPIRATORY_TRACT | Status: AC
  Administered 2021-11-02: 2.5 mg via RESPIRATORY_TRACT

## 2021-11-02 MED ORDER — NYSTATIN 100000 UNIT/ML MT SUSP: 5.0000 mL | Freq: Four times a day (QID) | OROMUCOSAL | 0 refills | Status: DC

## 2021-11-02 MED ORDER — METHYLPREDNISOLONE 4 MG PO TBPK: ORAL_TABLET | ORAL | 0 refills | Status: DC

## 2021-11-02 MED ORDER — ALBUTEROL SULFATE (2.5 MG/3ML) 0.083% IN NEBU: 2.5000 mg | INHALATION_SOLUTION | Freq: Four times a day (QID) | RESPIRATORY_TRACT | 1 refills | Status: DC | PRN

## 2021-11-02 NOTE — Assessment & Plan Note (Signed)
Concern for COPD exacerbation. Better after neb. Will treat with medrol dose pack and nebs. Obtain CXR. Await results- treat as needed. Recheck 1-2 weeks.

## 2021-11-02 NOTE — Progress Notes (Signed)
BP (!) 162/61   Pulse 86   Temp 98.4 F (36.9 C)   Wt 92 lb 4.8 oz (41.9 kg)   LMP  (LMP Unknown)   SpO2 94%   BMI 18.64 kg/m    Subjective:    Patient ID: Kristi Clark, female    DOB: 28-Dec-1950, 71 y.o.   MRN: [id number]  HPI: Kristi Clark is a 71 y.o. female  Chief Complaint  Patient presents with   Fever    Patient states she gets low grade fevers at home at night for a few weeks. Patient states she has been coughing since last week.    Leg Swelling    Patient states both her legs and feet are swollen.    Oral Pain    Patient states she thinks she has thrush again    UPPER RESPIRATORY TRACT INFECTION Duration: a couple of weeks Worst symptom: fever, SOB, wheezing Fever: yes- Has been having fevers for about 2 weeks at night- up to 101.  Cough: yes Shortness of breath: yes Wheezing: yes Chest pain: yes Chest tightness: yes Chest congestion: yes Nasal congestion: no Runny nose: no Post nasal drip: no Sneezing: no Sore throat: no Swollen glands: no Sinus pressure: no Headache: no Face pain: no Toothache: no Ear pain: no  Ear pressure: no  Eyes red/itching:no Eye drainage/crusting: no  Vomiting: no Rash: no Fatigue: yes Sick contacts: no Strep contacts: no  Context: worse Recurrent sinusitis: no Relief with OTC cold/cough medications: no  Treatments attempted: none    Relevant past medical, surgical, family and social history reviewed and updated as indicated. Interim medical history since our last visit reviewed. Allergies and medications reviewed and updated.  Review of Systems  Constitutional:  Positive for fatigue and fever. Negative for activity change, appetite change, chills, diaphoresis and unexpected weight change.  HENT: Negative.    Respiratory:  Positive for apnea, cough, chest tightness, shortness of breath and wheezing. Negative for choking and stridor.   Cardiovascular:  Positive for leg swelling. Negative for chest pain  and palpitations.  Gastrointestinal: Negative.   Musculoskeletal: Negative.   Neurological: Negative.   Psychiatric/Behavioral: Negative.      Per HPI unless specifically indicated above     Objective:    BP (!) 162/61   Pulse 86   Temp 98.4 F (36.9 C)   Wt 92 lb 4.8 oz (41.9 kg)   LMP  (LMP Unknown)   SpO2 94%   BMI 18.64 kg/m   Wt Readings from Last 3 Encounters:  11/02/21 92 lb 4.8 oz (41.9 kg)  10/21/21 90 lb 3.2 oz (40.9 kg)  10/05/21 93 lb 4.8 oz (42.3 kg)    Physical Exam Vitals and nursing note reviewed.  Constitutional:      General: She is not in acute distress.    Appearance: Normal appearance. She is normal weight. She is not ill-appearing, toxic-appearing or diaphoretic.  HENT:     Head: Normocephalic and atraumatic.     Right Ear: External ear normal.     Left Ear: External ear normal.     Nose: Nose normal.     Mouth/Throat:     Mouth: Mucous membranes are moist.     Pharynx: Oropharynx is clear.  Eyes:     General: No scleral icterus.       Right eye: No discharge.        Left eye: No discharge.     Extraocular Movements: Extraocular movements intact.  Conjunctiva/sclera: Conjunctivae normal.     Pupils: Pupils are equal, round, and reactive to light.  Cardiovascular:     Rate and Rhythm: Normal rate and regular rhythm.     Pulses: Normal pulses.     Heart sounds: Normal heart sounds. No murmur heard.    No friction rub. No gallop.  Pulmonary:     Effort: Pulmonary effort is normal. No respiratory distress.     Breath sounds: No stridor. Examination of the right-upper field reveals decreased breath sounds. Examination of the left-upper field reveals decreased breath sounds and rhonchi. Examination of the right-middle field reveals decreased breath sounds. Examination of the left-middle field reveals decreased breath sounds. Examination of the right-lower field reveals decreased breath sounds. Examination of the left-lower field reveals  decreased breath sounds. Decreased breath sounds and rhonchi (LUL) present. No wheezing or rales.  Chest:     Chest wall: No tenderness.  Musculoskeletal:        General: Normal range of motion.     Cervical back: Normal range of motion and neck supple.  Skin:    General: Skin is warm and dry.     Capillary Refill: Capillary refill takes less than 2 seconds.     Coloration: Skin is not jaundiced or pale.     Findings: No bruising, erythema, lesion or rash.  Neurological:     General: No focal deficit present.     Mental Status: She is alert and oriented to person, place, and time. Mental status is at baseline.  Psychiatric:        Mood and Affect: Mood normal.        Behavior: Behavior normal.        Thought Content: Thought content normal.        Judgment: Judgment normal.     Results for orders placed or performed in visit on 10/05/21  WET PREP FOR [place], YEAST, CLUE   Specimen: Urine   Urine  Result Value Ref Range   Trichomonas Exam Negative Negative   Yeast Exam Negative Negative   Clue Cell Exam Negative Negative  Urinalysis, Routine w reflex microscopic  Result Value Ref Range   Specific Gravity, UA 1.015 1.005 - 1.030   pH, UA 6.0 5.0 - 7.5   Color, UA Yellow Yellow   Appearance Ur Clear Clear   Leukocytes,UA Negative Negative   Protein,UA Negative Negative/Trace   Glucose, UA Negative Negative   Ketones, UA Negative Negative   RBC, UA Negative Negative   Bilirubin, UA Negative Negative   Urobilinogen, Ur 0.2 0.2 - 1.0 mg/dL   Nitrite, UA Negative Negative      Assessment & Plan:   Problem List Items Addressed This Visit       Respiratory   COPD with acute exacerbation ([place])    Concern for COPD exacerbation. Better after neb. Will treat with medrol dose pack and nebs. Obtain CXR. Await results- treat as needed. Recheck 1-2 weeks.       Relevant Medications   methylPREDNISolone (MEDROL DOSEPAK) 4 MG TBPK tablet   albuterol (PROVENTIL) (2.5 MG/3ML)  0.083% nebulizer solution   Other Visit Diagnoses     Wheezing    -  Primary   See discussion under COPD exacerbation.    Relevant Medications   albuterol (PROVENTIL) (2.5 MG/3ML) 0.083% nebulizer solution 2.5 mg (Completed)   Other Relevant Orders   Novel Coronavirus, NAA ([facility])   DG Chest 2 View   Thrush  Will treat with nystatin. Call with any concerns.    Relevant Medications   nystatin (MYCOSTATIN) 100000 UNIT/ML suspension        Follow up plan: Return 1-2 weeks.

## 2021-11-03 ENCOUNTER — Ambulatory Visit: Payer: Medicare Other | Admitting: Dermatology

## 2021-11-03 ENCOUNTER — Telehealth: Payer: Self-pay | Admitting: Family Medicine

## 2021-11-03 NOTE — Telephone Encounter (Signed)
Pt seen provider yesterday. Pt says that provider prescribed solution for her nebulizer. Pt says that she also need the nebulizer.   Pt would like to have sent to:   [facility]  [address] [place] [address] Phone: [phone]

## 2021-11-04 ENCOUNTER — Encounter: Payer: Self-pay | Admitting: Family Medicine

## 2021-11-04 ENCOUNTER — Other Ambulatory Visit: Payer: Self-pay

## 2021-11-04 DIAGNOSIS — J441 Chronic obstructive pulmonary disease with (acute) exacerbation: Secondary | ICD-10-CM

## 2021-11-04 LAB — NOVEL CORONAVIRUS, NAA: SARS-CoV-2, NAA: NOT DETECTED

## 2021-11-04 NOTE — Progress Notes (Signed)
[facility]

## 2021-11-05 DIAGNOSIS — H353132 Nonexudative age-related macular degeneration, bilateral, intermediate dry stage: Secondary | ICD-10-CM | POA: Diagnosis not present

## 2021-11-05 DIAGNOSIS — H25811 Combined forms of age-related cataract, right eye: Secondary | ICD-10-CM | POA: Diagnosis not present

## 2021-11-05 NOTE — Telephone Encounter (Signed)
Nebulizer order has been placed, waiting on provider signature to send to DME store.

## 2021-11-08 ENCOUNTER — Encounter: Payer: Self-pay | Admitting: Family Medicine

## 2021-11-09 ENCOUNTER — Other Ambulatory Visit: Payer: Self-pay | Admitting: Family Medicine

## 2021-11-09 MED ORDER — LEVOFLOXACIN 750 MG PO TABS: 750.0000 mg | ORAL_TABLET | Freq: Every day | ORAL | 0 refills | Status: DC

## 2021-11-09 NOTE — Telephone Encounter (Signed)
Faxed nebulizer order to [facility]

## 2021-11-12 ENCOUNTER — Ambulatory Visit: Payer: Self-pay

## 2021-11-12 NOTE — Telephone Encounter (Signed)
See note on MyChart message.

## 2021-11-12 NOTE — Telephone Encounter (Signed)
Per Dr. Durenda Age request called patient to scheduled virtual appointment due to positive covid test. [facility] advising patient to call back to schedule appointment.

## 2021-11-12 NOTE — Telephone Encounter (Signed)
  Chief Complaint: advice Symptoms: tested positive for COVID Frequency: yesterday Pertinent Negatives: Patient denies SOB Disposition: '[]'$ ED /'[]'$ Urgent Care (no appt availability in office) / '[]'$ Appointment(In office/virtual)/ '[]'$  [place] [facility]/ '[x]'$ Home Care/ '[]'$ Refused Recommended Disposition /'[]'$ [place] [facility]/ '[]'$  Follow-up with PCP Additional Notes: pt has PNA but tested positive for COVID yesterday and wanting to know how long to quarantine since she has appts next week. She said her granddaughter came over yesterday and had tested positive and she tested yesterday evening. I advised pt quarantine for 5 days and then face mask for 10 days if able to tolerate. I educated pt to monitor symptoms and notify us if anything gets worse. Pt has been watching oxygen level and SOB. Pt will monitor and see how she is next week and I educated if no fever and no SOB then should be ok to come to appts but if either of those to reschedule. Pt verbalized understanding.   Summary: COVID & [place] advise   Pt is calling to ask how long she needs to quartine with COVID and pneumonia. Please advise      Reason for Disposition  Health Information question, no triage required and triager able to answer question  Answer Assessment - Initial Assessment Questions 1. REASON FOR CALL or QUESTION: "What is your reason for calling today?" or "How can I best help you?" or "What question do you have that I can help answer?"      Pt just needing to know quarantine time for COVID since she has PNA too.  Protocols used: Information Only Call - No Triage-A-AH

## 2021-11-15 ENCOUNTER — Telehealth (INDEPENDENT_AMBULATORY_CARE_PROVIDER_SITE_OTHER): Payer: Medicare Other | Admitting: Nurse Practitioner

## 2021-11-15 ENCOUNTER — Encounter: Payer: Self-pay | Admitting: Nurse Practitioner

## 2021-11-15 DIAGNOSIS — U071 COVID-19: Secondary | ICD-10-CM | POA: Insufficient documentation

## 2021-11-15 MED ORDER — MOLNUPIRAVIR EUA 200MG CAPSULE: 4.0000 | ORAL_CAPSULE | Freq: Two times a day (BID) | ORAL | 0 refills | Status: AC

## 2021-11-15 NOTE — Assessment & Plan Note (Signed)
Acute and symptoms start + testing positive on 11/11/21 (4 days).  She is currently completing treatment for pneumonia (Levaquin), recommend she complete this.  Is very high risk.  Have recommend she consistently wear home O2 at home 24 hours a day at this time, monitor O2 sats and if get to <90% with O2 on or worsening symptoms then immediately go to ER.  Will start [place], educated her at length on this -- with goal of preventing worsening illness and hospitalization.  Recommend: - Increased rest - Increasing Fluids - Acetaminophen as needed for fever/pain.  - Salt water gargling, chloraseptic spray and throat lozenges - OTC Coricidin - Continue nebulizer treatment at home. - Mucinex.  - Humidifying the air. Return in one week for follow-up, sooner if worsening.

## 2021-11-15 NOTE — Progress Notes (Signed)
Pt scheduled  

## 2021-11-15 NOTE — Patient Instructions (Signed)
Infection Prevention in the Home If you have an infection, may have been exposed to an infection, or are taking care of someone who has an infection, it is important to know how to keep the infection from spreading. Follow your health care provider's instructions and use these guidelines to help stop the spread of infection. How infections are spread In order for an infection to spread, the following must be present: A germ. This may be a virus, bacteria, fungus, or parasite. A place for the germ to live. This may be: On or in a person, animal, plant, or food. In soil or water. On surfaces, such as a door handle. A person or animal who can develop a disease if the germ enters the body (host). The host does not have resistance to the germ. A way for the germ to enter the host. This may occur by: Direct contact with an infected person or animal. This can happen through shaking hands or hugging. Some germs can also travel through the air and spread to others. This can happen when an infected person coughs or sneezes on or near other people. Indirect contact. This occurs when the germ enters the host through contact with an infected object. Examples include: Eating or drinking food or water that is contaminated with the germ. Touching a contaminated surface with your hands, and then touching your face, eyes, nose, or mouth. Supplies needed: Soap. Alcohol-based hand sanitizer. Standard cleaning products. Disinfectants, such as bleach. Reusable cleaning cloths, sponges, or paper towels. Disposable or reusable utility gloves. How to prevent infection from spreading There are several things that you can do to help prevent infection from spreading. Take these general actions Everyone should take the following actions to prevent the spread of infection: Wash your hands often with soap and water for at least 20 seconds. If soap and water are not available, use alcohol-based hand sanitizer. Avoid  touching your face, mouth, nose, or eyes. Cough or sneeze into a tissue, sleeve, or elbow instead of into your hand or into the air. If you cough or sneeze into a tissue, throw it away immediately and wash your hands.  Keep your bathroom clean Provide soap. Change towels and washcloths frequently. Change toothbrushes often and store them separately in a clean, dry place. Clean and disinfect all surfaces, including the toilet, floor, tub, shower, and sink. Do not share personal items, such as razors, toothbrushes, deodorant, combs, brushes, towels, and washcloths. Maintain hygiene in the [facility] your hands before and after preparing food and before you eat. Clean the inside of your refrigerator each week. Keep your refrigerator set at 56F (4C) or less, and set your freezer at 28F (-18C) or less. Keep work surfaces clean. Disinfect them regularly. Wash your dishes in hot, soapy water. Air-dry your dishes or use a dishwasher. Do not share dishes or eating utensils. Handle food safely Store food carefully. Refrigerate leftovers promptly in covered containers. Throw out stale or spoiled food. Thaw foods in the refrigerator or microwave, not at room temperature. Serve foods at the proper temperature. Do not eat raw meat. Make sure it is cooked to the appropriate temperature. Cook eggs until they are firm. Wash fruits and vegetables under running water. Use separate cutting boards, plates, and utensils for raw foods and cooked foods. Use a clean spoon each time you sample food while cooking. Do laundry the right way Wear gloves if laundry is visibly soiled. Do not shake soiled laundry. Doing that may send germs  into the air. Wash laundry in hot water. If you cannot wash the laundry right away, place it in a plastic bag and wash it as soon as possible. Be careful around animals and pets Wash your hands before and after touching animals. If you have a pet, ensure that your pet stays  clean. Do not let people with weak immune systems touch bird droppings, fish tank water, or a litter box. If you have a pet cage or litter box, be sure to clean it every day. If you are sick, stay away from animals and have someone else care for them if possible. How to clean and disinfect objects and surfaces Precautions Some disinfectants work for certain germs and not others. Read the manufacturer's instructions or read online resources to determine if the product you are using will work for the germ you are trying to remove. If you choose to use bleach, use it safely. Never mix it with other cleaning products, especially those that contain ammonia. This mixture can create a dangerous gas that may be deadly. Keep proper movement of fresh air in your home (ventilation). Pour used mop water down the utility sink or toilet. Do not pour this water down the kitchen sink. Objects and surfaces  If surfaces are visibly soiled, clean them first with soap and water before disinfecting. Disinfect surfaces that are frequently touched every day. This may include: Counters. Tables. Doorknobs. Sinks and faucets. Electronics, such as: Architectural technologist. Remote controls. Keyboards. Computers and tablets. Cleaning supplies Some cleaning supplies can breed germs. Take good care of them to prevent germs from spreading. To do this: Soak toilet brushes, mops, and sponges in bleach and water for 5 minutes after each use, or according to manufacturer's instructions. Wash reusable cleaning cloths and sanitize sponges after each use. Throw away disposable gloves after one use. Replace reusable utility gloves if they are cracked or torn or if they start to peel. Additional actions if you are sick If you live with other people:  Avoid close contact with those around you. Stay at least 3 ft (1 m) away from others, if possible. Use a separate bathroom, if possible. If possible, sleep in a separate bedroom or in a  separate bed to prevent infecting other household members. Change bedroom linens each week or whenever they are soiled. Have everyone in the household wash hands often with soap and water for at least 20 seconds. If soap and water are not available, use alcohol-based hand sanitizer. In general: Stay home except to get medical care. Call ahead before visiting your health care provider. Ask others to get groceries and household supplies and to refill prescriptions for you. Avoid public areas. Try not to take public transportation. If you can, wear a mask if you need to go out of the house, or if you are in close contact with someone who is not sick. Avoid visitors until you have completely recovered, or until you have no signs and symptoms of infection. Avoid preparing food or providing care for others. If you must prepare food or provide care for others, wear a mask and wash your hands before and after doing these things. Where to find more information Centers for Disease Control and Prevention: StoreMirror.com.cy Summary It is important to know how to keep infection from spreading. Make sure everyone in your household washes their hands often with soap and water. Disinfect surfaces that are frequently touched every day. If you are sick, stay home except to get medical care. This  information is not intended to replace advice given to you by your health care provider. Make sure you discuss any questions you have with your health care provider. Document Revised: 04/12/2021 Document Reviewed: 04/12/2021 Elsevier Patient Education  [place].

## 2021-11-15 NOTE — Progress Notes (Signed)
BP (!) 162/56   Pulse 87   LMP  (LMP Unknown)    Subjective:    Patient ID: Kristi Clark, female    DOB: 1950-11-17, 71 y.o.   MRN: [id number]  HPI: Kristi Clark is a 71 y.o. female  Chief Complaint  Patient presents with   Pneumonia    Pt states she is still not feeling well since having pneumonia and covid recently. States her O2 is dropping with activity.    This visit was completed via video visit through MyChart due to the restrictions of the COVID-19 pandemic. All issues as above were discussed and addressed. Physical exam was done as above through visual confirmation on video through MyChart. If it was felt that the patient should be evaluated in the office, they were directed there. The patient verbally consented to this visit. Location of the patient: home Location of the provider: work Those involved with this call:  Provider: Marnee Guarneri, DNP CMA: Yvonna Alanis, [place] Desk/Registration: [employer]  Time spent on call:  21 minutes with patient face to face via video conference. More than 50% of this time was spent in counseling and coordination of care. 15 minutes total spent in review of patient's record and preparation of their chart.  I verified patient identity using two factors (patient name and date of birth). Patient consents verbally to being seen via telemedicine visit today.    COVID & PNEUMONIA Diagnosed on 11/02/21 with pneumonia, treated with steroid taper + abx therapy (Levaquin).  On 11/11/21 she reports she tested positive for Covid at home, her grand daughter has been sick.  Had 2 positive Covid tests.  Is using nebulizer and her portable O2.  O2 sats with activity & without O2 on are in high 80's, but with her O2 are running 92-97%. Fever: low grade Cough: yes Shortness of breath:  with activity only, is wearing her O2 at home Wheezing: yes Chest pain: no Chest tightness: yes Chest congestion: yes Nasal congestion: yes Runny  nose: yes Post nasal drip: yes Sneezing: no Sore throat: no Swollen glands: no Sinus pressure: no Headache: yes Face pain: no Toothache: no Ear pain: none Ear pressure: none Eyes red/itching:no Eye drainage/crusting: no  Vomiting: no Rash: no Fatigue: yes Sick contacts: yes Strep contacts: no  Context: fluctuating Recurrent sinusitis: no Relief with OTC cold/cough medications: yes  Treatments attempted: cold/sinus and antibiotics    Relevant past medical, surgical, family and social history reviewed and updated as indicated. Interim medical history since our last visit reviewed. Allergies and medications reviewed and updated.  Review of Systems  Constitutional:  Positive for fatigue and fever. Negative for activity change, appetite change and chills.  HENT:  Positive for congestion, postnasal drip and rhinorrhea. Negative for ear discharge, ear pain, facial swelling, sinus pressure, sinus pain, sneezing, sore throat and voice change.   Eyes:  Negative for pain and visual disturbance.  Respiratory:  Positive for cough, chest tightness, shortness of breath and wheezing.   Cardiovascular:  Negative for chest pain, palpitations and leg swelling.  Endocrine: Negative.   Musculoskeletal:  Negative for myalgias.  Neurological:  Positive for headaches. Negative for dizziness and numbness.  Psychiatric/Behavioral: Negative.      Per HPI unless specifically indicated above     Objective:    BP (!) 162/56   Pulse 87   LMP  (LMP Unknown)   Wt Readings from Last 3 Encounters:  11/02/21 92 lb 4.8 oz (41.9 kg)  10/21/21 90 lb 3.2 oz (40.9 kg)  10/05/21 93 lb 4.8 oz (42.3 kg)    Physical Exam Vitals and nursing note reviewed.  Constitutional:      General: She is awake. She is not in acute distress.    Appearance: She is well-developed and well-groomed. She is not ill-appearing or toxic-appearing.  HENT:     Head: Normocephalic.     Right Ear: Hearing normal.     Left Ear:  Hearing normal.  Eyes:     General: Lids are normal.        Right eye: No discharge.        Left eye: No discharge.     Conjunctiva/sclera: Conjunctivae normal.  Pulmonary:     Effort: Pulmonary effort is normal. No accessory muscle usage or respiratory distress.  Musculoskeletal:     Cervical back: Normal range of motion.  Neurological:     Mental Status: She is alert and oriented to person, place, and time.  Psychiatric:        Attention and Perception: Attention normal.        Mood and Affect: Mood normal.        Behavior: Behavior normal. Behavior is cooperative.        Thought Content: Thought content normal.        Judgment: Judgment normal.     Results for orders placed or performed in visit on 11/02/21  Novel Coronavirus, NAA ([facility])   Specimen: Saline  Result Value Ref Range   SARS-CoV-2, NAA Not Detected Not Detected      Assessment & Plan:   Problem List Items Addressed This Visit       Other   COVID-19    Acute and symptoms start + testing positive on 11/11/21 (4 days).  She is currently completing treatment for pneumonia (Levaquin), recommend she complete this.  Is very high risk.  Have recommend she consistently wear home O2 at home 24 hours a day at this time, monitor O2 sats and if get to <90% with O2 on or worsening symptoms then immediately go to ER.  Will start [place], educated her at length on this -- with goal of preventing worsening illness and hospitalization.  Recommend: - Increased rest - Increasing Fluids - Acetaminophen as needed for fever/pain.  - Salt water gargling, chloraseptic spray and throat lozenges - OTC Coricidin - Continue nebulizer treatment at home. - Mucinex.  - Humidifying the air. Return in one week for follow-up, sooner if worsening.      Relevant Medications   molnupiravir EUA (LAGEVRIO) 200 mg CAPS capsule    Video visit lasting 20 minutes, patient is aware if any worsening symptoms to immediately go to  ER.  Follow up plan: Return in about 1 week (around 11/22/2021) for Covid Follow-up and lung check.

## 2021-11-20 NOTE — Patient Instructions (Incomplete)
Infection Prevention in the Home If you have an infection, may have been exposed to an infection, or are taking care of someone who has an infection, it is important to know how to keep the infection from spreading. Follow your health care provider's instructions and use these guidelines to help stop the spread of infection. How infections are spread In order for an infection to spread, the following must be present: A germ. This may be a virus, bacteria, fungus, or parasite. A place for the germ to live. This may be: On or in a person, animal, plant, or food. In soil or water. On surfaces, such as a door handle. A person or animal who can develop a disease if the germ enters the body (host). The host does not have resistance to the germ. A way for the germ to enter the host. This may occur by: Direct contact with an infected person or animal. This can happen through shaking hands or hugging. Some germs can also travel through the air and spread to others. This can happen when an infected person coughs or sneezes on or near other people. Indirect contact. This occurs when the germ enters the host through contact with an infected object. Examples include: Eating or drinking food or water that is contaminated with the germ. Touching a contaminated surface with your hands, and then touching your face, eyes, nose, or mouth. Supplies needed: Soap. Alcohol-based hand sanitizer. Standard cleaning products. Disinfectants, such as bleach. Reusable cleaning cloths, sponges, or paper towels. Disposable or reusable utility gloves. How to prevent infection from spreading There are several things that you can do to help prevent infection from spreading. Take these general actions Everyone should take the following actions to prevent the spread of infection: Wash your hands often with soap and water for at least 20 seconds. If soap and water are not available, use alcohol-based hand sanitizer. Avoid  touching your face, mouth, nose, or eyes. Cough or sneeze into a tissue, sleeve, or elbow instead of into your hand or into the air. If you cough or sneeze into a tissue, throw it away immediately and wash your hands.  Keep your bathroom clean Provide soap. Change towels and washcloths frequently. Change toothbrushes often and store them separately in a clean, dry place. Clean and disinfect all surfaces, including the toilet, floor, tub, shower, and sink. Do not share personal items, such as razors, toothbrushes, deodorant, combs, brushes, towels, and washcloths. Maintain hygiene in the [facility] your hands before and after preparing food and before you eat. Clean the inside of your refrigerator each week. Keep your refrigerator set at 984F (4C) or less, and set your freezer at 84F (-18C) or less. Keep work surfaces clean. Disinfect them regularly. Wash your dishes in hot, soapy water. Air-dry your dishes or use a dishwasher. Do not share dishes or eating utensils. Handle food safely Store food carefully. Refrigerate leftovers promptly in covered containers. Throw out stale or spoiled food. Thaw foods in the refrigerator or microwave, not at room temperature. Serve foods at the proper temperature. Do not eat raw meat. Make sure it is cooked to the appropriate temperature. Cook eggs until they are firm. Wash fruits and vegetables under running water. Use separate cutting boards, plates, and utensils for raw foods and cooked foods. Use a clean spoon each time you sample food while cooking. Do laundry the right way Wear gloves if laundry is visibly soiled. Do not shake soiled laundry. Doing that may send germs  into the air. Wash laundry in hot water. If you cannot wash the laundry right away, place it in a plastic bag and wash it as soon as possible. Be careful around animals and pets Wash your hands before and after touching animals. If you have a pet, ensure that your pet stays  clean. Do not let people with weak immune systems touch bird droppings, fish tank water, or a litter box. If you have a pet cage or litter box, be sure to clean it every day. If you are sick, stay away from animals and have someone else care for them if possible. How to clean and disinfect objects and surfaces Precautions Some disinfectants work for certain germs and not others. Read the manufacturer's instructions or read online resources to determine if the product you are using will work for the germ you are trying to remove. If you choose to use bleach, use it safely. Never mix it with other cleaning products, especially those that contain ammonia. This mixture can create a dangerous gas that may be deadly. Keep proper movement of fresh air in your home (ventilation). Pour used mop water down the utility sink or toilet. Do not pour this water down the kitchen sink. Objects and surfaces  If surfaces are visibly soiled, clean them first with soap and water before disinfecting. Disinfect surfaces that are frequently touched every day. This may include: Counters. Tables. Doorknobs. Sinks and faucets. Electronics, such as: Architectural technologist. Remote controls. Keyboards. Computers and tablets. Cleaning supplies Some cleaning supplies can breed germs. Take good care of them to prevent germs from spreading. To do this: Soak toilet brushes, mops, and sponges in bleach and water for 5 minutes after each use, or according to manufacturer's instructions. Wash reusable cleaning cloths and sanitize sponges after each use. Throw away disposable gloves after one use. Replace reusable utility gloves if they are cracked or torn or if they start to peel. Additional actions if you are sick If you live with other people:  Avoid close contact with those around you. Stay at least 3 ft (1 m) away from others, if possible. Use a separate bathroom, if possible. If possible, sleep in a separate bedroom or in a  separate bed to prevent infecting other household members. Change bedroom linens each week or whenever they are soiled. Have everyone in the household wash hands often with soap and water for at least 20 seconds. If soap and water are not available, use alcohol-based hand sanitizer. In general: Stay home except to get medical care. Call ahead before visiting your health care provider. Ask others to get groceries and household supplies and to refill prescriptions for you. Avoid public areas. Try not to take public transportation. If you can, wear a mask if you need to go out of the house, or if you are in close contact with someone who is not sick. Avoid visitors until you have completely recovered, or until you have no signs and symptoms of infection. Avoid preparing food or providing care for others. If you must prepare food or provide care for others, wear a mask and wash your hands before and after doing these things. Where to find more information Centers for Disease Control and Prevention: Store[employer].cy Summary It is important to know how to keep infection from spreading. Make sure everyone in your household washes their hands often with soap and water. Disinfect surfaces that are frequently touched every day. If you are sick, stay home except to get medical care. This  information is not intended to replace advice given to you by your health care provider. Make sure you discuss any questions you have with your health care provider. Document Revised: 04/12/2021 Document Reviewed: 04/12/2021 Elsevier Patient Education  [place].

## 2021-11-22 DIAGNOSIS — Z9861 Coronary angioplasty status: Secondary | ICD-10-CM | POA: Diagnosis not present

## 2021-11-22 DIAGNOSIS — I2510 Atherosclerotic heart disease of native coronary artery without angina pectoris: Secondary | ICD-10-CM | POA: Diagnosis not present

## 2021-11-22 DIAGNOSIS — F17200 Nicotine dependence, unspecified, uncomplicated: Secondary | ICD-10-CM | POA: Diagnosis not present

## 2021-11-22 DIAGNOSIS — E782 Mixed hyperlipidemia: Secondary | ICD-10-CM | POA: Diagnosis not present

## 2021-11-22 DIAGNOSIS — I10 Essential (primary) hypertension: Secondary | ICD-10-CM | POA: Diagnosis not present

## 2021-11-22 DIAGNOSIS — I340 Nonrheumatic mitral (valve) insufficiency: Secondary | ICD-10-CM | POA: Diagnosis not present

## 2021-11-22 DIAGNOSIS — J449 Chronic obstructive pulmonary disease, unspecified: Secondary | ICD-10-CM | POA: Diagnosis not present

## 2021-11-23 ENCOUNTER — Encounter: Payer: Self-pay | Admitting: Nurse Practitioner

## 2021-11-23 ENCOUNTER — Ambulatory Visit (INDEPENDENT_AMBULATORY_CARE_PROVIDER_SITE_OTHER): Payer: Medicare Other | Admitting: Nurse Practitioner

## 2021-11-23 VITALS — BP 132/53 | HR 74 | Temp 98.1°F | Ht 59.0 in | Wt 90.0 lb

## 2021-11-23 DIAGNOSIS — U071 COVID-19: Secondary | ICD-10-CM

## 2021-11-23 DIAGNOSIS — J189 Pneumonia, unspecified organism: Secondary | ICD-10-CM | POA: Insufficient documentation

## 2021-11-23 NOTE — Assessment & Plan Note (Signed)
Acute and improving.  Has completed treatment.  Recommend complete cessation of smoking for prevention.  Will plan on repeat CXR at 4-6 week mark to ensure improvement.  O2 sats stable on RA today.

## 2021-11-23 NOTE — Assessment & Plan Note (Signed)
Acute and improved at this time -- has completed Molnupiravir. Recommend continue to rest and ensure plenty of fluid intake at home.  Complete cessation of smoking recommended.

## 2021-11-23 NOTE — Progress Notes (Signed)
BP (!) 132/53   Pulse 74   Temp 98.1 F (36.7 C) (Oral)   Ht '4\' 11"'$  (1.499 m)   Wt 90 lb (40.8 kg)   LMP  (LMP Unknown)   SpO2 94% Comment: RA  BMI 18.18 kg/m    Subjective:    Patient ID: Kristi Clark, female    DOB: 15-Oct-1950, 71 y.o.   MRN: [id number]  HPI: Kristi Clark is a 71 y.o. female  Chief Complaint  Patient presents with   Covid Positive    Patient is here for a one week follow up on Covid Positive and Lung Check. Patient says she is doing pretty good, and says she is not having to use her oxygen as much. Patient says she has re-tested for COVID and it was negative. Patient says she her niece who has been taking care of her is really ill now.    Pneumonia   COVID & PNEUMONIA Follow-up for Covid and Pneumonia.  Diagnosed on 11/02/21 with pneumonia, treated with steroid taper + abx therapy (Levaquin).  Tested positive for Covid at home on 11/15/21 and treated with Molnupiravir.  Not having to use O2 as much at home, continues nebulizer.  Overall she reports starting to feel better.  Tested negative for Covid at home recently.  She is a smoker, has cut back while she has been sick. Fever: no Cough: improving Shortness of breath: with activity only, baseline Wheezing: improved Chest pain: no Chest tightness: no Chest congestion: no Nasal congestion: occasional Runny nose: no Post nasal drip: no Sneezing: no Sore throat: no Swollen glands: no Sinus pressure: no Headache: no Face pain: no Toothache: no Ear pain: none Ear pressure: none Eyes red/itching:no Eye drainage/crusting: no  Vomiting: no Rash: no Fatigue: yes Sick contacts: yes Strep contacts: no  Context: fluctuating Recurrent sinusitis: no Relief with OTC cold/cough medications: yes  Treatments attempted: cold/sinus and antibiotics, and Molnupiravir  Relevant past medical, surgical, family and social history reviewed and updated as indicated. Interim medical history since our last  visit reviewed. Allergies and medications reviewed and updated.  Review of Systems  Constitutional:  Negative for activity change, appetite change, chills, fatigue and fever.  HENT:  Positive for congestion. Negative for ear discharge, ear pain, facial swelling, postnasal drip, rhinorrhea, sinus pressure, sinus pain, sneezing, sore throat and voice change.   Eyes:  Negative for pain and visual disturbance.  Respiratory:  Positive for wheezing. Negative for cough, chest tightness and shortness of breath.   Cardiovascular:  Negative for chest pain, palpitations and leg swelling.  Musculoskeletal:  Negative for myalgias.  Neurological:  Negative for dizziness, numbness and headaches.  Psychiatric/Behavioral: Negative.      Per HPI unless specifically indicated above     Objective:    BP (!) 132/53   Pulse 74   Temp 98.1 F (36.7 C) (Oral)   Ht '4\' 11"'$  (1.499 m)   Wt 90 lb (40.8 kg)   LMP  (LMP Unknown)   SpO2 94% Comment: RA  BMI 18.18 kg/m   Wt Readings from Last 3 Encounters:  11/23/21 90 lb (40.8 kg)  11/02/21 92 lb 4.8 oz (41.9 kg)  10/21/21 90 lb 3.2 oz (40.9 kg)    Physical Exam Vitals and nursing note reviewed.  Constitutional:      General: She is awake. She is not in acute distress.    Appearance: She is well-developed and underweight. She is not ill-appearing or toxic-appearing.  HENT:  Head: Normocephalic.     Right Ear: Hearing, tympanic membrane, ear canal and external ear normal.     Left Ear: Hearing, tympanic membrane, ear canal and external ear normal.     Nose: Nose normal.     Right Sinus: No maxillary sinus tenderness or frontal sinus tenderness.     Left Sinus: No maxillary sinus tenderness or frontal sinus tenderness.     Mouth/Throat:     Mouth: Mucous membranes are moist.     Pharynx: Oropharynx is clear.  Eyes:     General: Lids are normal.        Right eye: No discharge.        Left eye: No discharge.     Conjunctiva/sclera: Conjunctivae  normal.     Pupils: Pupils are equal, round, and reactive to light.  Neck:     Vascular: No carotid bruit.  Cardiovascular:     Rate and Rhythm: Normal rate and regular rhythm.     Heart sounds: Normal heart sounds. No murmur heard.    No gallop.  Pulmonary:     Effort: Pulmonary effort is normal. No accessory muscle usage or respiratory distress.     Breath sounds: Wheezing present. No rhonchi.     Comments: Scattered expiratory wheezes throughout, no rhonchi. Abdominal:     General: Bowel sounds are normal.     Palpations: Abdomen is soft.  Musculoskeletal:     Cervical back: Normal range of motion and neck supple.     Right lower leg: No edema.     Left lower leg: No edema.  Skin:    General: Skin is warm and dry.  Neurological:     Mental Status: She is alert and oriented to person, place, and time.  Psychiatric:        Attention and Perception: Attention normal.        Mood and Affect: Mood normal.        Speech: Speech normal.        Behavior: Behavior normal. Behavior is cooperative.        Thought Content: Thought content normal.     Results for orders placed or performed in visit on 11/02/21  Novel Coronavirus, NAA ([facility])   Specimen: Saline  Result Value Ref Range   SARS-CoV-2, NAA Not Detected Not Detected      Assessment & Plan:   Problem List Items Addressed This Visit       Respiratory   Pneumonia of left lower lobe due to infectious organism    Acute and improving.  Has completed treatment.  Recommend complete cessation of smoking for prevention.  Will plan on repeat CXR at 4-6 week mark to ensure improvement.  O2 sats stable on RA today.      Relevant Orders   DG Chest 2 View     Other   COVID-19 - Primary    Acute and improved at this time -- has completed Molnupiravir. Recommend continue to rest and ensure plenty of fluid intake at home.  Complete cessation of smoking recommended.      Relevant Orders   DG Chest 2 View     Follow up  plan: Return for as scheduled with Dr. Lenna Sciara on Monday.

## 2021-11-26 ENCOUNTER — Ambulatory Visit
Admission: RE | Admit: 2021-11-26 | Discharge: 2021-11-26 | Disposition: A | Discharge status: Home / Self Care | Payer: Medicare Other | Attending: Nurse Practitioner | Admitting: Nurse Practitioner

## 2021-11-26 ENCOUNTER — Ambulatory Visit
Admission: RE | Admit: 2021-11-26 | Discharge: 2021-11-26 | Disposition: A | Discharge status: Home / Self Care | Payer: Medicare Other | Source: Ambulatory Visit | Attending: Nurse Practitioner | Admitting: Nurse Practitioner

## 2021-11-26 DIAGNOSIS — J189 Pneumonia, unspecified organism: Secondary | ICD-10-CM | POA: Diagnosis not present

## 2021-11-26 DIAGNOSIS — U071 COVID-19: Secondary | ICD-10-CM | POA: Diagnosis not present

## 2021-11-29 ENCOUNTER — Ambulatory Visit (INDEPENDENT_AMBULATORY_CARE_PROVIDER_SITE_OTHER): Payer: Medicare Other | Admitting: Family Medicine

## 2021-11-29 ENCOUNTER — Encounter: Payer: Self-pay | Admitting: Family Medicine

## 2021-11-29 VITALS — BP 142/40 | HR 79 | Wt 92.1 lb

## 2021-11-29 DIAGNOSIS — J189 Pneumonia, unspecified organism: Secondary | ICD-10-CM

## 2021-11-29 DIAGNOSIS — Z23 Encounter for immunization: Secondary | ICD-10-CM | POA: Diagnosis not present

## 2021-11-29 DIAGNOSIS — I952 Hypotension due to drugs: Secondary | ICD-10-CM | POA: Diagnosis not present

## 2021-11-29 DIAGNOSIS — R634 Abnormal weight loss: Secondary | ICD-10-CM

## 2021-11-29 NOTE — Progress Notes (Signed)
BP (!) 142/40   Pulse 79   Wt 92 lb 1.6 oz (41.8 kg)   LMP  (LMP Unknown)   SpO2 90%   BMI 18.60 kg/m    Subjective:    Patient ID: Kristi Clark, female    DOB: January 30, 1951, 71 y.o.   MRN: [id number]  HPI: Kristi Clark is a 71 y.o. female  Chief Complaint  Patient presents with   Weight Loss    Patient here to follow up on weight gain medication, states she is doing well on Remeron.    Leg Swelling    Patient states her right leg swells up    WEIGHT LOSS Duration: months Amount of weight loss: 10+ lbs Fevers: yes Decreased appetite: yes Night sweats: yes Dysphagia/odynophagia: no Chest pain: no Shortness of breath: yes Cough: yes Nausea: no Vomiting: no Abdominal pain: no Blood in stool: no Easy bruising/bleeding: no Jaundice: no Polydipsia/polyuria: no Depression: no Previous colonoscopy: yes  Relevant past medical, surgical, family and social history reviewed and updated as indicated. Interim medical history since our last visit reviewed. Allergies and medications reviewed and updated.  Review of Systems  Constitutional: Negative.   Respiratory: Negative.    Cardiovascular: Negative.   Gastrointestinal: Negative.   Musculoskeletal: Negative.   Neurological: Negative.   Psychiatric/Behavioral: Negative.      Per HPI unless specifically indicated above     Objective:    BP (!) 142/40   Pulse 79   Wt 92 lb 1.6 oz (41.8 kg)   LMP  (LMP Unknown)   SpO2 90%   BMI 18.60 kg/m   Wt Readings from Last 3 Encounters:  11/29/21 92 lb 1.6 oz (41.8 kg)  11/23/21 90 lb (40.8 kg)  11/02/21 92 lb 4.8 oz (41.9 kg)    Physical Exam Vitals and nursing note reviewed.  Constitutional:      General: She is not in acute distress.    Appearance: Normal appearance. She is not ill-appearing, toxic-appearing or diaphoretic.  HENT:     Head: Normocephalic and atraumatic.     Right Ear: External ear normal.     Left Ear: External ear normal.     Nose:  Nose normal.     Mouth/Throat:     Mouth: Mucous membranes are moist.     Pharynx: Oropharynx is clear.  Eyes:     General: No scleral icterus.       Right eye: No discharge.        Left eye: No discharge.     Extraocular Movements: Extraocular movements intact.     Conjunctiva/sclera: Conjunctivae normal.     Pupils: Pupils are equal, round, and reactive to light.  Cardiovascular:     Rate and Rhythm: Normal rate and regular rhythm.     Pulses: Normal pulses.     Heart sounds: Normal heart sounds. No murmur heard.    No friction rub. No gallop.  Pulmonary:     Effort: Pulmonary effort is normal. No respiratory distress.     Breath sounds: No stridor. No wheezing, rhonchi or rales.     Comments: Tight breath sounds bilaterally Chest:     Chest wall: No tenderness.  Musculoskeletal:        General: Normal range of motion.     Cervical back: Normal range of motion and neck supple.  Skin:    General: Skin is warm and dry.     Capillary Refill: Capillary refill takes less than 2 seconds.  Coloration: Skin is not jaundiced or pale.     Findings: No bruising, erythema, lesion or rash.  Neurological:     General: No focal deficit present.     Mental Status: She is alert and oriented to person, place, and time. Mental status is at baseline.  Psychiatric:        Mood and Affect: Mood normal.        Behavior: Behavior normal.        Thought Content: Thought content normal.        Judgment: Judgment normal.     Results for orders placed or performed in visit on 11/02/21  Novel Coronavirus, NAA ([facility])   Specimen: Saline  Result Value Ref Range   SARS-CoV-2, NAA Not Detected Not Detected      Assessment & Plan:   Problem List Items Addressed This Visit       Respiratory   Pneumonia of left lower lobe due to infectious organism    Resolved. Feeling much better.      Other Visit Diagnoses     Weight loss    -  Primary   Doing well. up 2 lbs. Continue remeron. Call  with any concerns.    Hypotension due to drugs       Doing OK. Continue to monitor. Call with any concerns.    Need for influenza vaccination       Relevant Orders   Flu Vaccine QUAD High Dose(Fluad) (Completed)        Follow up plan: Return in about 3 months (around 02/28/2022).

## 2021-11-29 NOTE — Progress Notes (Signed)
Contacted via [place] afternoon Kristi Clark, your imaging returned and no pneumonia noted on repeat.  Changes to lung related to your COPD noted.  Any questions? Keep being amazing!!  Thank you for allowing me to participate in your care.  I appreciate you. Kindest regards, Zakaria Sedor

## 2021-11-29 NOTE — Assessment & Plan Note (Signed)
Resolved. Feeling much better.

## 2021-12-02 ENCOUNTER — Ambulatory Visit: Payer: Medicare Other | Admitting: Family Medicine

## 2021-12-05 ENCOUNTER — Encounter: Payer: Self-pay | Admitting: Family Medicine

## 2021-12-06 ENCOUNTER — Ambulatory Visit: Payer: Self-pay

## 2021-12-06 NOTE — Telephone Encounter (Signed)
appt

## 2021-12-06 NOTE — Telephone Encounter (Signed)
   Chief Complaint: Wet cough, Low fever, right leg/ankle swelling Symptoms: above Frequency: Ongoing - worse over the weekend Pertinent Negatives: Patient denies  Disposition: '[x]'$ ED /'[]'$ Urgent Care (no appt availability in office) / '[]'$ Appointment(In office/virtual)/ '[]'$  [facility] [facility]/ '[]'$ Home Care/ '[x]'$ Refused Recommended Disposition /'[]'$ [place] [facility]/ '[]'$  Follow-up with PCP Additional Notes: PT has had COVID and pneumonia recently.  PT also has COPD. Pt has low grade fever of 100.1, a very wet cough and states she is SOB with 2 L o2. Currently pt is not wearing her O2. Pt is speaking well, and does not sound out of breath.  Pt states that her right leg and ankle have been swollen off and on over the past few days. Pt refuses ED. Pt and niece will monitor tonight and seek care if needed.  Reason for Disposition  Oxygen level (e.g., pulse oximetry) 90 percent or lower  Answer Assessment - Initial Assessment Questions 1. RESPIRATORY STATUS: "Describe your breathing?" (e.g., wheezing, shortness of breath, unable to speak, severe coughing)      Coughing a lot  - Very wet. 2. ONSET: "When did this breathing problem begin?"      Ongoing worse this weekend 3. PATTERN "Does the difficult breathing come and go, or has it been constant since it started?"      Come and going 4. SEVERITY: "How bad is your breathing?" (e.g., mild, moderate, severe)    - MILD: No SOB at rest, mild SOB with walking, speaks normally in sentences, can lie down, no retractions, pulse < 100.    - MODERATE: SOB at rest, SOB with minimal exertion and prefers to sit, cannot lie down flat, speaks in phrases, mild retractions, audible wheezing, pulse 100-120.    - SEVERE: Very SOB at rest, speaks in single words, struggling to breathe, sitting hunched forward, retractions, pulse > 120      Mild 5. RECURRENT SYMPTOM: "Have you had difficulty breathing before?" If Yes, ask: "When was the last time?" and "What happened  that time?"      yes 6. CARDIAC HISTORY: "Do you have any history of heart disease?" (e.g., heart attack, angina, bypass surgery, angioplasty)      Angina 7. LUNG HISTORY: "Do you have any history of lung disease?"  (e.g., pulmonary embolus, asthma, emphysema)     COPD 8. CAUSE: "What do you think is causing the breathing problem?"      COVID COPD 9. OTHER SYMPTOMS: "Do you have any other symptoms? (e.g., dizziness, runny nose, cough, chest pain, fever)     Cough  - wet,  Chest congestion, and sinus congestion, Right leg swelling 10. O2 SATURATION MONITOR:  "Do you use an oxygen saturation monitor (pulse oximeter) at home?" If Yes, ask: "What is your reading (oxygen level) today?" "What is your usual oxygen saturation reading?" (e.g., 95%)       88% 11. PREGNANCY: "Is there any chance you are pregnant?" "When was your last menstrual period?"       no 12. TRAVEL: "Have you traveled out of the country in the last month?" (e.g., travel history, exposures)  Protocols used: Breathing Difficulty-A-AH

## 2021-12-07 ENCOUNTER — Ambulatory Visit
Admission: RE | Admit: 2021-12-07 | Discharge: 2021-12-07 | Disposition: A | Discharge status: Home / Self Care | Payer: Medicare Other | Attending: Family Medicine | Admitting: Family Medicine

## 2021-12-07 ENCOUNTER — Ambulatory Visit (INDEPENDENT_AMBULATORY_CARE_PROVIDER_SITE_OTHER): Payer: Medicare Other | Admitting: Family Medicine

## 2021-12-07 ENCOUNTER — Ambulatory Visit
Admission: RE | Admit: 2021-12-07 | Discharge: 2021-12-07 | Disposition: A | Discharge status: Home / Self Care | Payer: Medicare Other | Source: Ambulatory Visit | Attending: Family Medicine | Admitting: Family Medicine

## 2021-12-07 ENCOUNTER — Encounter: Payer: Self-pay | Admitting: Family Medicine

## 2021-12-07 VITALS — BP 157/52 | HR 102 | Temp 99.5°F | Wt 92.9 lb

## 2021-12-07 DIAGNOSIS — R051 Acute cough: Secondary | ICD-10-CM | POA: Diagnosis not present

## 2021-12-07 DIAGNOSIS — J441 Chronic obstructive pulmonary disease with (acute) exacerbation: Secondary | ICD-10-CM | POA: Insufficient documentation

## 2021-12-07 DIAGNOSIS — R059 Cough, unspecified: Secondary | ICD-10-CM | POA: Diagnosis not present

## 2021-12-07 DIAGNOSIS — R062 Wheezing: Secondary | ICD-10-CM | POA: Diagnosis not present

## 2021-12-07 DIAGNOSIS — J449 Chronic obstructive pulmonary disease, unspecified: Secondary | ICD-10-CM | POA: Diagnosis not present

## 2021-12-07 MED ORDER — METHYLPREDNISOLONE 4 MG PO TBPK: ORAL_TABLET | ORAL | 0 refills | Status: DC

## 2021-12-07 MED ORDER — TRIAMCINOLONE ACETONIDE 40 MG/ML IJ SUSP: 40.0000 mg | Freq: Once | INTRAMUSCULAR | Status: DC

## 2021-12-07 MED ORDER — DOXYCYCLINE HYCLATE 100 MG PO TABS: 100.0000 mg | ORAL_TABLET | Freq: Two times a day (BID) | ORAL | 0 refills | Status: DC

## 2021-12-07 MED ORDER — ALBUTEROL SULFATE (2.5 MG/3ML) 0.083% IN NEBU: 2.5000 mg | INHALATION_SOLUTION | Freq: Once | RESPIRATORY_TRACT | Status: DC

## 2021-12-07 NOTE — Progress Notes (Signed)
BP (!) 157/52   Pulse (!) 102   Temp 99.5 F (37.5 C)   Wt 92 lb 14.4 oz (42.1 kg)   LMP  (LMP Unknown)   SpO2 96%   BMI 18.76 kg/m    Subjective:    Patient ID: Kristi Clark, female    DOB: August 16, 1950, 71 y.o.   MRN: [id number]  HPI: Kristi Clark is a 71 y.o. female  Chief Complaint  Patient presents with   Cough    Patient states she started coughing yesterday and feeling short of breath. Has been on oxygen because her O2 was dropping in the 80's.    Fever    Patient states she had a low grade fever yesterday and feels weak.    UPPER RESPIRATORY TRACT INFECTION Duration: yesterda Worst symptom: cough, SOB Fever: yes Cough: yes Shortness of breath: yes Wheezing: yes Chest pain: yes, with cough Chest tightness: yes Chest congestion: yes Nasal congestion: yes Runny nose: no Post nasal drip: yes Sneezing: no Sore throat: no Swollen glands: no Sinus pressure: yes Headache: yes Face pain: no Toothache: no Ear pain: no  Ear pressure: yes "right Eyes red/itching:no Eye drainage/crusting: no  Vomiting: no Rash: no Fatigue: no Sick contacts: no Strep contacts: no  Context: worse Recurrent sinusitis: no Relief with OTC cold/cough medications: no  Treatments attempted: oxygen    Relevant past medical, surgical, family and social history reviewed and updated as indicated. Interim medical history since our last visit reviewed. Allergies and medications reviewed and updated.  Review of Systems  Constitutional:  Positive for chills, diaphoresis, fatigue and fever. Negative for activity change, appetite change and unexpected weight change.  HENT:  Positive for congestion, postnasal drip and sinus pressure. Negative for dental problem, drooling, ear discharge, ear pain, facial swelling, hearing loss, mouth sores, nosebleeds, rhinorrhea, sinus pain, sneezing, sore throat, tinnitus, trouble swallowing and voice change.   Respiratory:  Positive for cough,  chest tightness, shortness of breath and wheezing. Negative for apnea, choking and stridor.   Cardiovascular: Negative.   Gastrointestinal: Negative.   Musculoskeletal: Negative.   Skin: Negative.   Psychiatric/Behavioral: Negative.      Per HPI unless specifically indicated above     Objective:    BP (!) 157/52   Pulse (!) 102   Temp 99.5 F (37.5 C)   Wt 92 lb 14.4 oz (42.1 kg)   LMP  (LMP Unknown)   SpO2 96%   BMI 18.76 kg/m   Wt Readings from Last 3 Encounters:  12/07/21 92 lb 14.4 oz (42.1 kg)  11/29/21 92 lb 1.6 oz (41.8 kg)  11/23/21 90 lb (40.8 kg)    Physical Exam Vitals and nursing note reviewed.  Constitutional:      General: She is not in acute distress.    Appearance: Normal appearance. She is normal weight. She is not ill-appearing, toxic-appearing or diaphoretic.  HENT:     Head: Normocephalic and atraumatic.     Right Ear: External ear normal.     Left Ear: External ear normal.     Nose: Nose normal.     Mouth/Throat:     Mouth: Mucous membranes are moist.     Pharynx: Oropharynx is clear.  Eyes:     General: No scleral icterus.       Right eye: No discharge.        Left eye: No discharge.     Extraocular Movements: Extraocular movements intact.     Conjunctiva/sclera: Conjunctivae normal.  Pupils: Pupils are equal, round, and reactive to light.  Cardiovascular:     Rate and Rhythm: Normal rate and regular rhythm.     Pulses: Normal pulses.     Heart sounds: Normal heart sounds. No murmur heard.    No friction rub. No gallop.  Pulmonary:     Effort: Respiratory distress present.     Breath sounds: No stridor. Wheezing and rhonchi present. No rales.  Chest:     Chest wall: No tenderness.  Musculoskeletal:        General: Normal range of motion.     Cervical back: Normal range of motion and neck supple.  Skin:    General: Skin is warm and dry.     Capillary Refill: Capillary refill takes less than 2 seconds.     Coloration: Skin is not  jaundiced or pale.     Findings: No bruising, erythema, lesion or rash.  Neurological:     General: No focal deficit present.     Mental Status: She is alert and oriented to person, place, and time. Mental status is at baseline.  Psychiatric:        Mood and Affect: Mood normal.        Behavior: Behavior normal.        Thought Content: Thought content normal.        Judgment: Judgment normal.     Results for orders placed or performed in visit on 11/02/21  Novel Coronavirus, NAA ([facility])   Specimen: Saline  Result Value Ref Range   SARS-CoV-2, NAA Not Detected Not Detected      Assessment & Plan:   Problem List Items Addressed This Visit   None Visit Diagnoses     COPD exacerbation ([place])    -  Primary   Warning signs to go to ER discussed. Go get x-ray. Start medrol dose pack and doxy. Follow up Friday. Call with any concerns.    Relevant Medications   triamcinolone acetonide (KENALOG-40) injection 40 mg   albuterol (PROVENTIL) (2.5 MG/3ML) 0.083% nebulizer solution 2.5 mg   methylPREDNISolone (MEDROL DOSEPAK) 4 MG TBPK tablet   Other Relevant Orders   DG Chest 2 View   Acute cough       Relevant Medications   triamcinolone acetonide (KENALOG-40) injection 40 mg   albuterol (PROVENTIL) (2.5 MG/3ML) 0.083% nebulizer solution 2.5 mg   Other Relevant Orders   Rapid Strep Screen ([facility] ONLY)   Veritor Flu A/B Waived   Novel Coronavirus, NAA ([facility])   DG Chest 2 View        Follow up plan: Return Friday Lung follow up.

## 2021-12-09 LAB — NOVEL CORONAVIRUS, NAA: SARS-CoV-2, NAA: NOT DETECTED

## 2021-12-10 ENCOUNTER — Encounter: Payer: Self-pay | Admitting: Family Medicine

## 2021-12-10 ENCOUNTER — Ambulatory Visit (INDEPENDENT_AMBULATORY_CARE_PROVIDER_SITE_OTHER): Payer: Medicare Other | Admitting: Family Medicine

## 2021-12-10 VITALS — BP 160/54 | HR 85 | Temp 98.2°F | Wt 92.1 lb

## 2021-12-10 DIAGNOSIS — J441 Chronic obstructive pulmonary disease with (acute) exacerbation: Secondary | ICD-10-CM | POA: Diagnosis not present

## 2021-12-10 LAB — RAPID STREP SCREEN (MED CTR MEBANE ONLY): Strep Gp A Ag, IA W/Reflex: NEGATIVE

## 2021-12-10 LAB — VERITOR FLU A/B WAIVED
Influenza A: NEGATIVE
Influenza B: NEGATIVE

## 2021-12-10 LAB — CULTURE, GROUP A STREP: Strep A Culture: NEGATIVE

## 2021-12-10 MED ORDER — AYR SALINE NASAL NA GEL: 1.0000 | NASAL | 6 refills | Status: DC | PRN

## 2021-12-10 MED ORDER — NYSTATIN 100000 UNIT/ML MT SUSP: 5.0000 mL | Freq: Four times a day (QID) | OROMUCOSAL | 0 refills | Status: DC

## 2021-12-10 NOTE — Progress Notes (Signed)
BP (!) 160/54   Pulse 85   Temp 98.2 F (36.8 C)   Wt 92 lb 1.6 oz (41.8 kg)   LMP  (LMP Unknown)   SpO2 95%   BMI 18.60 kg/m    Subjective:    Patient ID: Kristi Clark, female    DOB: Nov 29, 1950, 71 y.o.   MRN: [id number]  HPI: Kristi Clark is a 71 y.o. female  Chief Complaint  Patient presents with   COPD   COPD COPD status: exacerbated Satisfied with current treatment?: yes Oxygen use: yes Dyspnea frequency: multiple times a day Cough frequency: multiple times a day Rescue inhaler frequency:  multiple times a day Limitation of activity: yes Productive cough: no Pneumovax: Up to Date Influenza: Up to Date  Relevant past medical, surgical, family and social history reviewed and updated as indicated. Interim medical history since our last visit reviewed. Allergies and medications reviewed and updated.  Review of Systems  Constitutional:  Positive for fatigue. Negative for activity change, appetite change, chills, diaphoresis, fever and unexpected weight change.  Respiratory:  Positive for cough, chest tightness, shortness of breath and wheezing. Negative for apnea, choking and stridor.   Cardiovascular: Negative.   Gastrointestinal: Negative.   Musculoskeletal: Negative.   Neurological: Negative.   Psychiatric/Behavioral: Negative.      Per HPI unless specifically indicated above     Objective:    BP (!) 160/54   Pulse 85   Temp 98.2 F (36.8 C)   Wt 92 lb 1.6 oz (41.8 kg)   LMP  (LMP Unknown)   SpO2 95%   BMI 18.60 kg/m   Wt Readings from Last 3 Encounters:  12/10/21 92 lb 1.6 oz (41.8 kg)  12/07/21 92 lb 14.4 oz (42.1 kg)  11/29/21 92 lb 1.6 oz (41.8 kg)    Physical Exam Vitals and nursing note reviewed.  Constitutional:      General: She is not in acute distress.    Appearance: Normal appearance. She is not ill-appearing, toxic-appearing or diaphoretic.  HENT:     Head: Normocephalic and atraumatic.     Right Ear: External ear  normal.     Left Ear: External ear normal.     Nose: Nose normal.     Mouth/Throat:     Mouth: Mucous membranes are moist.     Pharynx: Oropharynx is clear.  Eyes:     General: No scleral icterus.       Right eye: No discharge.        Left eye: No discharge.     Extraocular Movements: Extraocular movements intact.     Conjunctiva/sclera: Conjunctivae normal.     Pupils: Pupils are equal, round, and reactive to light.  Cardiovascular:     Rate and Rhythm: Normal rate and regular rhythm.     Pulses: Normal pulses.     Heart sounds: Normal heart sounds. No murmur heard.    No friction rub. No gallop.  Pulmonary:     Effort: Pulmonary effort is normal. No respiratory distress.     Breath sounds: No stridor. Wheezing present. No rhonchi or rales.  Chest:     Chest wall: No tenderness.  Musculoskeletal:        General: Normal range of motion.     Cervical back: Normal range of motion and neck supple.  Skin:    General: Skin is warm and dry.     Capillary Refill: Capillary refill takes less than 2 seconds.  Coloration: Skin is not jaundiced or pale.     Findings: No bruising, erythema, lesion or rash.  Neurological:     General: No focal deficit present.     Mental Status: She is alert and oriented to person, place, and time. Mental status is at baseline.  Psychiatric:        Mood and Affect: Mood normal.        Behavior: Behavior normal.        Thought Content: Thought content normal.        Judgment: Judgment normal.     Results for orders placed or performed in visit on 12/07/21  Rapid Strep Screen ([facility] ONLY)   Specimen: Other   Other  Result Value Ref Range   Strep Gp A Ag, IA W/Reflex Negative Negative  Novel Coronavirus, NAA ([facility])   Specimen: Saline  Result Value Ref Range   SARS-CoV-2, NAA Not Detected Not Detected  Culture, Group A Strep   Other  Result Value Ref Range   Strep A Culture Negative   Veritor Flu A/B Waived  Result Value Ref  Range   Influenza A Negative Negative   Influenza B Negative Negative      Assessment & Plan:   Problem List Items Addressed This Visit   None Visit Diagnoses     COPD exacerbation ([place])    -  Primary   Doing OK. Continue current regimen. Recheck about 10 days. Call wtih any concerns.    Relevant Medications   saline (AYR) GEL        Follow up plan: Return in about 11 days (around 12/21/2021).   15 minutes spent with patient and daughter today.

## 2021-12-14 DIAGNOSIS — J849 Interstitial pulmonary disease, unspecified: Secondary | ICD-10-CM | POA: Diagnosis not present

## 2021-12-14 DIAGNOSIS — J449 Chronic obstructive pulmonary disease, unspecified: Secondary | ICD-10-CM | POA: Diagnosis not present

## 2021-12-14 DIAGNOSIS — Z9981 Dependence on supplemental oxygen: Secondary | ICD-10-CM | POA: Diagnosis not present

## 2021-12-14 DIAGNOSIS — R918 Other nonspecific abnormal finding of lung field: Secondary | ICD-10-CM | POA: Diagnosis not present

## 2021-12-16 ENCOUNTER — Ambulatory Visit
Admission: RE | Admit: 2021-12-16 | Discharge: 2021-12-16 | Disposition: A | Discharge status: Home / Self Care | Payer: Medicare Other | Source: Ambulatory Visit | Attending: Family Medicine | Admitting: Family Medicine

## 2021-12-16 DIAGNOSIS — Z1231 Encounter for screening mammogram for malignant neoplasm of breast: Secondary | ICD-10-CM | POA: Diagnosis not present

## 2021-12-21 ENCOUNTER — Encounter: Payer: Self-pay | Admitting: Family Medicine

## 2021-12-21 ENCOUNTER — Ambulatory Visit: Payer: Medicare Other | Admitting: Family Medicine

## 2021-12-21 ENCOUNTER — Ambulatory Visit (INDEPENDENT_AMBULATORY_CARE_PROVIDER_SITE_OTHER): Payer: Medicare Other | Admitting: Family Medicine

## 2021-12-21 VITALS — BP 142/52 | HR 83 | Temp 98.2°F | Wt 87.4 lb

## 2021-12-21 DIAGNOSIS — R634 Abnormal weight loss: Secondary | ICD-10-CM

## 2021-12-21 DIAGNOSIS — J441 Chronic obstructive pulmonary disease with (acute) exacerbation: Secondary | ICD-10-CM

## 2021-12-21 MED ORDER — MIRTAZAPINE 45 MG PO TABS: 45.0000 mg | ORAL_TABLET | Freq: Every day | ORAL | 1 refills | Status: DC

## 2021-12-21 NOTE — Progress Notes (Signed)
BP (!) 142/52   Pulse 83   Temp 98.2 F (36.8 C)   Wt 87 lb 6.4 oz (39.6 kg)   LMP  (LMP Unknown)   SpO2 98%   BMI 17.65 kg/m    Subjective:    Patient ID: Kristi Clark, female    DOB: [birth year], 71 y.o.   MRN: [id number]  HPI: Kristi Clark is a 71 y.o. female  Chief Complaint  Patient presents with   COPD    Patient here to follow up on COPD, states she is doing better but does not have any energy.    Weight Loss    Patient is concerned about loosing more weight. Has been drinking daily breakfast essential and eating small portions. Patient states she does not have any energy to cook. Has been feeling a little down and stressed.    WEIGHT LOSS Duration: chronic Fevers: no Decreased appetite: yes Night sweats: yes Dysphagia/odynophagia: no Chest pain: no Shortness of breath: yes Cough: no Nausea: no Vomiting: no Abdominal pain: no Blood in stool: no Easy bruising/bleeding: no Jaundice: no Polydipsia/polyuria: no Depression: yes Previous colonoscopy: no  Breathing is doing better. Has finished her medicine. No other concerns.   Relevant past medical, surgical, family and social history reviewed and updated as indicated. Interim medical history since our last visit reviewed. Allergies and medications reviewed and updated.  Review of Systems  Constitutional:  Positive for unexpected weight change. Negative for activity change, appetite change, chills, diaphoresis, fatigue and fever.  HENT: Negative.    Respiratory: Negative.    Cardiovascular: Negative.   Gastrointestinal:  Positive for constipation. Negative for abdominal distention, abdominal pain, anal bleeding, blood in stool, diarrhea, nausea, rectal pain and vomiting.  Musculoskeletal: Negative.   Skin: Negative.   Psychiatric/Behavioral:  Positive for dysphoric mood. Negative for agitation, behavioral problems, confusion, decreased concentration, hallucinations, self-injury, sleep disturbance  and suicidal ideas. The patient is not nervous/anxious and is not hyperactive.     Per HPI unless specifically indicated above     Objective:    BP (!) 142/52   Pulse 83   Temp 98.2 F (36.8 C)   Wt 87 lb 6.4 oz (39.6 kg)   LMP  (LMP Unknown)   SpO2 98%   BMI 17.65 kg/m   Wt Readings from Last 3 Encounters:  12/21/21 87 lb 6.4 oz (39.6 kg)  12/10/21 92 lb 1.6 oz (41.8 kg)  12/07/21 92 lb 14.4 oz (42.1 kg)    Physical Exam Vitals and nursing note reviewed.  Constitutional:      General: She is not in acute distress.    Appearance: Normal appearance. She is cachectic. She is not ill-appearing, toxic-appearing or diaphoretic.  HENT:     Head: Normocephalic and atraumatic.     Right Ear: External ear normal.     Left Ear: External ear normal.     Nose: Nose normal.     Mouth/Throat:     Mouth: Mucous membranes are moist.     Pharynx: Oropharynx is clear.  Eyes:     General: No scleral icterus.       Right eye: No discharge.        Left eye: No discharge.     Extraocular Movements: Extraocular movements intact.     Conjunctiva/sclera: Conjunctivae normal.     Pupils: Pupils are equal, round, and reactive to light.  Cardiovascular:     Rate and Rhythm: Normal rate and regular rhythm.  Pulses: Normal pulses.     Heart sounds: Normal heart sounds. No murmur heard.    No friction rub. No gallop.  Pulmonary:     Effort: Pulmonary effort is normal. No respiratory distress.     Breath sounds: Normal breath sounds. No stridor. No wheezing, rhonchi or rales.  Chest:     Chest wall: No tenderness.  Musculoskeletal:        General: Normal range of motion.     Cervical back: Normal range of motion and neck supple.  Skin:    General: Skin is warm.     Capillary Refill: Capillary refill takes less than 2 seconds.     Coloration: Skin is not jaundiced or pale.     Findings: No bruising, erythema, lesion or rash.  Neurological:     General: No focal deficit present.      Mental Status: She is alert and oriented to person, place, and time. Mental status is at baseline.  Psychiatric:        Mood and Affect: Mood normal.        Behavior: Behavior normal.        Thought Content: Thought content normal.        Judgment: Judgment normal.     Results for orders placed or performed in visit on 12/07/21  Rapid Strep Screen ([facility] ONLY)   Specimen: Other   Other  Result Value Ref Range   Strep Gp A Ag, IA W/Reflex Negative Negative  Novel Coronavirus, NAA ([facility])   Specimen: Saline  Result Value Ref Range   SARS-CoV-2, NAA Not Detected Not Detected  Culture, Group A Strep   Other  Result Value Ref Range   Strep A Culture Negative   Veritor Flu A/B Waived  Result Value Ref Range   Influenza A Negative Negative   Influenza B Negative Negative      Assessment & Plan:   Problem List Items Addressed This Visit   None Visit Diagnoses     Weight loss    -  Primary   Down another 5lbs. Encouraged PO intake, continue ensure. Increase remeron to '45mg'$ . Recheck about 6 weeks.    COPD exacerbation ([place])       Lungs clear today. Continues to slowly improve. Continue inhalers. Call with any concerns.         Follow up plan: Return in about 6 weeks (around 02/01/2022).

## 2021-12-22 ENCOUNTER — Telehealth: Payer: Self-pay | Admitting: Family Medicine

## 2021-12-22 NOTE — Telephone Encounter (Signed)
Patient states PCP wanted her to start mirtazapine (REMERON) 45 MG tablet as soon as possible therefore please send to      [facility] (N),  - [place] Phone:  [phone]  Fax:  [phone]

## 2021-12-25 DIAGNOSIS — E785 Hyperlipidemia, unspecified: Secondary | ICD-10-CM | POA: Diagnosis not present

## 2021-12-25 DIAGNOSIS — N898 Other specified noninflammatory disorders of vagina: Secondary | ICD-10-CM | POA: Diagnosis not present

## 2021-12-25 DIAGNOSIS — I129 Hypertensive chronic kidney disease with stage 1 through stage 4 chronic kidney disease, or unspecified chronic kidney disease: Secondary | ICD-10-CM | POA: Diagnosis not present

## 2021-12-25 DIAGNOSIS — N189 Chronic kidney disease, unspecified: Secondary | ICD-10-CM | POA: Diagnosis not present

## 2021-12-25 DIAGNOSIS — R300 Dysuria: Secondary | ICD-10-CM | POA: Diagnosis not present

## 2021-12-25 DIAGNOSIS — Z87891 Personal history of nicotine dependence: Secondary | ICD-10-CM | POA: Diagnosis not present

## 2021-12-25 DIAGNOSIS — J441 Chronic obstructive pulmonary disease with (acute) exacerbation: Secondary | ICD-10-CM | POA: Diagnosis not present

## 2021-12-25 DIAGNOSIS — I2510 Atherosclerotic heart disease of native coronary artery without angina pectoris: Secondary | ICD-10-CM | POA: Diagnosis not present

## 2021-12-27 ENCOUNTER — Ambulatory Visit (INDEPENDENT_AMBULATORY_CARE_PROVIDER_SITE_OTHER): Payer: Medicare Other | Admitting: Family Medicine

## 2021-12-27 ENCOUNTER — Encounter: Payer: Self-pay | Admitting: Family Medicine

## 2021-12-27 VITALS — BP 143/52 | HR 84 | Temp 98.3°F | Wt 88.3 lb

## 2021-12-27 DIAGNOSIS — R300 Dysuria: Secondary | ICD-10-CM

## 2021-12-27 DIAGNOSIS — I129 Hypertensive chronic kidney disease with stage 1 through stage 4 chronic kidney disease, or unspecified chronic kidney disease: Secondary | ICD-10-CM | POA: Diagnosis not present

## 2021-12-27 DIAGNOSIS — R634 Abnormal weight loss: Secondary | ICD-10-CM | POA: Diagnosis not present

## 2021-12-27 DIAGNOSIS — I509 Heart failure, unspecified: Secondary | ICD-10-CM | POA: Diagnosis not present

## 2021-12-27 DIAGNOSIS — R339 Retention of urine, unspecified: Secondary | ICD-10-CM

## 2021-12-27 LAB — URINALYSIS, ROUTINE W REFLEX MICROSCOPIC
Bilirubin, UA: NEGATIVE
Glucose, UA: NEGATIVE
Ketones, UA: NEGATIVE
Leukocytes,UA: NEGATIVE
Nitrite, UA: NEGATIVE
Protein,UA: NEGATIVE
RBC, UA: NEGATIVE
Specific Gravity, UA: 1.015 (ref 1.005–1.030)
Urobilinogen, Ur: 0.2 mg/dL (ref 0.2–1.0)
pH, UA: 7 (ref 5.0–7.5)

## 2021-12-27 MED ORDER — HYDROCHLOROTHIAZIDE 12.5 MG PO CAPS: 12.5000 mg | ORAL_CAPSULE | Freq: Every day | ORAL | 3 refills | Status: DC | PRN

## 2021-12-27 NOTE — Progress Notes (Signed)
BP (!) 143/52   Pulse 84   Temp 98.3 F (36.8 C)   Wt 88 lb 4.8 oz (40.1 kg)   LMP  (LMP Unknown)   SpO2 98%   BMI 17.83 kg/m    Subjective:    Patient ID: Kristi Clark, female    DOB: 1951-01-09, 71 y.o.   MRN: [id number]  HPI: Kristi Clark is a 71 y.o. female  Chief Complaint  Patient presents with   Urinary Tract Infection    Patient states she feels like she is not urinating enough, was seen at [facility] ED on Saturday for UTI but everything was negative. Patient is having lower back pain.    Headache    Patient states she is having a headache for the last couple of days, worse at night. Per patient her BP was high at the ED.    URINARY SYMPTOMS- feels like she has been having trouble going to the bathroom Duration: weeks Dysuria: no Urinary frequency: yes Urgency: yes Small volume voids: yes Symptom severity: moderate Urinary incontinence: no Foul odor: no Hematuria: no Abdominal pain: yes Back pain: yes Suprapubic pain/pressure: yes Flank pain: yes Fever:  no Vomiting: no Relief with cranberry juice: no Relief with pyridium: no Status: worse Previous urinary tract infection: no Recurrent urinary tract infection: no History of sexually transmitted disease: no Vaginal discharge: no Treatments attempted: increasing fluids   HYPERTENSION  Hypertension status: controlled  Satisfied with current treatment? yes Duration of hypertension: chronic BP monitoring frequency:  not checking BP medication side effects:  no Medication compliance: excellent compliance Previous BP meds:imdur, losartan Aspirin: yes Recurrent headaches: yes Visual changes: no Palpitations: no Dyspnea: no Chest pain: no Lower extremity edema: yes Dizzy/lightheaded: no  Relevant past medical, surgical, family and social history reviewed and updated as indicated. Interim medical history since our last visit reviewed. Allergies and medications reviewed and updated.  Review of  Systems  Constitutional: Negative.   Respiratory: Negative.    Cardiovascular: Negative.   Gastrointestinal: Negative.   Musculoskeletal: Negative.   Neurological: Negative.   Psychiatric/Behavioral: Negative.      Per HPI unless specifically indicated above     Objective:    BP (!) 143/52   Pulse 84   Temp 98.3 F (36.8 C)   Wt 88 lb 4.8 oz (40.1 kg)   LMP  (LMP Unknown)   SpO2 98%   BMI 17.83 kg/m   Wt Readings from Last 3 Encounters:  12/27/21 88 lb 4.8 oz (40.1 kg)  12/21/21 87 lb 6.4 oz (39.6 kg)  12/10/21 92 lb 1.6 oz (41.8 kg)    Physical Exam Vitals and nursing note reviewed.  Constitutional:      General: She is not in acute distress.    Appearance: Normal appearance. She is well-developed. She is ill-appearing. She is not toxic-appearing or diaphoretic.  HENT:     Head: Normocephalic and atraumatic.     Right Ear: External ear normal.     Left Ear: External ear normal.     Nose: Nose normal.     Mouth/Throat:     Mouth: Mucous membranes are moist.     Pharynx: Oropharynx is clear.  Eyes:     General: No scleral icterus.       Right eye: No discharge.        Left eye: No discharge.     Extraocular Movements: Extraocular movements intact.     Conjunctiva/sclera: Conjunctivae normal.     Pupils: Pupils  are equal, round, and reactive to light.  Cardiovascular:     Rate and Rhythm: Normal rate and regular rhythm.     Pulses: Normal pulses.     Heart sounds: Normal heart sounds. No murmur heard.    No friction rub. No gallop.  Pulmonary:     Effort: Pulmonary effort is normal. No respiratory distress.     Breath sounds: Normal breath sounds. No stridor. No wheezing, rhonchi or rales.  Chest:     Chest wall: No tenderness.  Musculoskeletal:        General: Normal range of motion.     Cervical back: Normal range of motion and neck supple.  Skin:    General: Skin is warm and dry.     Capillary Refill: Capillary refill takes less than 2 seconds.      Coloration: Skin is not jaundiced or pale.     Findings: No bruising, erythema, lesion or rash.  Neurological:     General: No focal deficit present.     Mental Status: She is alert and oriented to person, place, and time. Mental status is at baseline.  Psychiatric:        Mood and Affect: Mood normal.        Behavior: Behavior normal.        Thought Content: Thought content normal.        Judgment: Judgment normal.     Results for orders placed or performed in visit on 12/27/21  Urinalysis, Routine w reflex microscopic  Result Value Ref Range   Specific Gravity, UA 1.015 1.005 - 1.030   pH, UA 7.0 5.0 - 7.5   Color, UA Yellow Yellow   Appearance Ur Clear Clear   Leukocytes,UA Negative Negative   Protein,UA Negative Negative/Trace   Glucose, UA Negative Negative   Ketones, UA Negative Negative   RBC, UA Negative Negative   Bilirubin, UA Negative Negative   Urobilinogen, Ur 0.2 0.2 - 1.0 mg/dL   Nitrite, UA Negative Negative   Microscopic Examination Comment       Assessment & Plan:   Problem List Items Addressed This Visit       Cardiovascular and Mediastinum   Benign hypertension with chronic kidney disease    BP running OK today- will start low dose HCTZ. Call if getting dizzy. Continue to monitor.       Relevant Medications   hydrochlorothiazide (MICROZIDE) 12.5 MG capsule   Congestive heart failure ([place])    Would like to talk to nutrition about what is good for her to eat. Getting very nervous about sodium to the point that she is not eating. Referral generated. BP running OK today- will start low dose HCTZ. Call if getting dizzy. Continue to monitor.       Relevant Medications   hydrochlorothiazide (MICROZIDE) 12.5 MG capsule   Other Relevant Orders   Amb ref to Medical Nutrition Therapy-MNT   Other Visit Diagnoses     Urinary retention    -  Primary   Will get her into urology for further evaluation. May need pelvic PT. Encouraged increased fluids and  managing constipation. Call with any concerns.    Relevant Orders   Ambulatory referral to Urology   Dysuria       UA clear.    Relevant Orders   Urinalysis, Routine w reflex microscopic (Completed)   Weight loss       Would like to talk to nutrition about what is good for her to eat. Getting  very nervous about sodium to the point that she is not eating. Referral generated.   Relevant Orders   Amb ref to Medical Nutrition Therapy-M[facility]        Follow up plan: Return as scheduled.

## 2021-12-27 NOTE — Assessment & Plan Note (Signed)
Would like to talk to nutrition about what is good for her to eat. Getting very nervous about sodium to the point that she is not eating. Referral generated. BP running OK today- will start low dose HCTZ. Call if getting dizzy. Continue to monitor.

## 2021-12-27 NOTE — Assessment & Plan Note (Signed)
BP running OK today- will start low dose HCTZ. Call if getting dizzy. Continue to monitor.

## 2022-01-04 ENCOUNTER — Ambulatory Visit
Admission: RE | Admit: 2022-01-04 | Discharge: 2022-01-04 | Disposition: A | Discharge status: Home / Self Care | Payer: Medicare Other | Source: Ambulatory Visit | Attending: Family Medicine | Admitting: Family Medicine

## 2022-01-04 ENCOUNTER — Ambulatory Visit
Admission: RE | Admit: 2022-01-04 | Discharge: 2022-01-04 | Disposition: A | Discharge status: Home / Self Care | Payer: Medicare Other | Source: Home / Self Care | Attending: Family Medicine | Admitting: Family Medicine

## 2022-01-04 ENCOUNTER — Ambulatory Visit (INDEPENDENT_AMBULATORY_CARE_PROVIDER_SITE_OTHER): Payer: Medicare Other | Admitting: Family Medicine

## 2022-01-04 ENCOUNTER — Encounter: Payer: Self-pay | Admitting: Family Medicine

## 2022-01-04 VITALS — BP 168/80 | HR 91 | Temp 98.4°F | Wt 89.3 lb

## 2022-01-04 DIAGNOSIS — I2510 Atherosclerotic heart disease of native coronary artery without angina pectoris: Secondary | ICD-10-CM

## 2022-01-04 DIAGNOSIS — J9611 Chronic respiratory failure with hypoxia: Secondary | ICD-10-CM | POA: Diagnosis not present

## 2022-01-04 DIAGNOSIS — R296 Repeated falls: Secondary | ICD-10-CM | POA: Diagnosis not present

## 2022-01-04 DIAGNOSIS — R079 Chest pain, unspecified: Secondary | ICD-10-CM | POA: Diagnosis not present

## 2022-01-04 DIAGNOSIS — I509 Heart failure, unspecified: Secondary | ICD-10-CM | POA: Diagnosis not present

## 2022-01-04 DIAGNOSIS — R413 Other amnesia: Secondary | ICD-10-CM

## 2022-01-04 DIAGNOSIS — R0602 Shortness of breath: Secondary | ICD-10-CM | POA: Insufficient documentation

## 2022-01-04 DIAGNOSIS — R634 Abnormal weight loss: Secondary | ICD-10-CM | POA: Diagnosis not present

## 2022-01-04 DIAGNOSIS — J449 Chronic obstructive pulmonary disease, unspecified: Secondary | ICD-10-CM | POA: Diagnosis not present

## 2022-01-04 DIAGNOSIS — J439 Emphysema, unspecified: Secondary | ICD-10-CM | POA: Diagnosis not present

## 2022-01-04 LAB — VERITOR FLU A/B WAIVED
Influenza A: NEGATIVE
Influenza B: NEGATIVE

## 2022-01-04 NOTE — Progress Notes (Signed)
BP (!) 168/80   Pulse 91   Temp 98.4 F (36.9 C)   Wt 89 lb 4.8 oz (40.5 kg)   LMP  (LMP Unknown)   SpO2 100%   BMI 18.04 kg/m    Subjective:    Patient ID: Kristi Clark, female    DOB: 1951-02-15, 70 y.o.   MRN: [id number]  HPI: Kristi Clark is a 71 y.o. female  Chief Complaint  Patient presents with   Shortness of Breath    Patient states she has been short of breath, and having chest discomfort. Per patient symptoms started a few days ago.    Memory Loss    Per daughter in law, patient is confused and having memory issues. Patient is concerned she is dehydrated.    SHORTNESS OF BREATH Duration: about a week Onset: gradual Description of breathing discomfort: can't catch her breath Severity: moderate Episode duration: constant Frequency:constant Related to exertion: yes Cough: yes non-productive Chest tightness: yes Wheezing: yes Fevers: yes Chest pain: yes Palpitations: yes  Nausea: yes Diaphoresis: yes Deconditioning: yes Status: worse  Has been having falls and having difficulty getting around. Would like home PT.      01/04/2022    3:27 PM  MMSE - Mini Mental State Exam  Orientation to time 5  Orientation to Place 5  Registration 3  Attention/ Calculation 5  Recall 3  Language- name 2 objects 2  Language- repeat 1  Language- follow 3 step command 3  Language- read & follow direction 1  Write a sentence 1  Copy design 1  Total score 30    Relevant past medical, surgical, family and social history reviewed and updated as indicated. Interim medical history since our last visit reviewed. Allergies and medications reviewed and updated.  Review of Systems  Constitutional: Negative.   HENT: Negative.    Respiratory:  Positive for cough, chest tightness, shortness of breath and wheezing. Negative for apnea, choking and stridor.   Cardiovascular: Negative.   Gastrointestinal: Negative.   Psychiatric/Behavioral:  Positive for confusion  and dysphoric mood. Negative for agitation, behavioral problems, decreased concentration, hallucinations, self-injury, sleep disturbance and suicidal ideas. The patient is not nervous/anxious and is not hyperactive.     Per HPI unless specifically indicated above     Objective:    BP (!) 168/80   Pulse 91   Temp 98.4 F (36.9 C)   Wt 89 lb 4.8 oz (40.5 kg)   LMP  (LMP Unknown)   SpO2 100%   BMI 18.04 kg/m   Wt Readings from Last 3 Encounters:  01/06/22 89 lb (40.4 kg)  01/04/22 89 lb 4.8 oz (40.5 kg)  12/27/21 88 lb 4.8 oz (40.1 kg)    Physical Exam Vitals and nursing note reviewed.  Constitutional:      General: She is not in acute distress.    Appearance: Normal appearance. She is well-developed. She is ill-appearing. She is not toxic-appearing or diaphoretic.  HENT:     Head: Normocephalic and atraumatic.     Right Ear: External ear normal.     Left Ear: External ear normal.     Nose: Nose normal.     Mouth/Throat:     Mouth: Mucous membranes are moist.     Pharynx: Oropharynx is clear.  Eyes:     General: No scleral icterus.       Right eye: No discharge.        Left eye: No discharge.  Extraocular Movements: Extraocular movements intact.     Conjunctiva/sclera: Conjunctivae normal.     Pupils: Pupils are equal, round, and reactive to light.  Cardiovascular:     Rate and Rhythm: Normal rate and regular rhythm.     Pulses: Normal pulses.     Heart sounds: Normal heart sounds. No murmur heard.    No friction rub. No gallop.  Pulmonary:     Effort: Pulmonary effort is normal. No respiratory distress.     Breath sounds: Normal breath sounds. No stridor. No wheezing, rhonchi or rales.  Chest:     Chest wall: No tenderness.  Musculoskeletal:        General: Normal range of motion.     Cervical back: Normal range of motion and neck supple.  Skin:    General: Skin is warm and dry.     Capillary Refill: Capillary refill takes less than 2 seconds.      Coloration: Skin is not jaundiced or pale.     Findings: No bruising, erythema, lesion or rash.  Neurological:     General: No focal deficit present.     Mental Status: She is alert and oriented to person, place, and time. Mental status is at baseline.  Psychiatric:        Mood and Affect: Mood normal.        Behavior: Behavior normal.        Thought Content: Thought content normal.        Judgment: Judgment normal.     Results for orders placed or performed in visit on 01/04/22  Novel Coronavirus, NAA ([facility])   Specimen: Saline  Result Value Ref Range   SARS-CoV-2, NAA Not Detected Not Detected  Veritor Flu A/B Waived  Result Value Ref Range   Influenza A Negative Negative   Influenza B Negative Negative      Assessment & Plan:   Problem List Items Addressed This Visit       Cardiovascular and Mediastinum   CAD (coronary artery disease)   Relevant Orders   Ambulatory referral to Home Health   Congestive heart failure [facility])   Relevant Orders   Ambulatory referral to Home Health     Respiratory   COPD, severe ([place])   Relevant Orders   Ambulatory referral to [place]   Chronic respiratory failure with hypoxia ([place])   Relevant Orders   Ambulatory referral to [place]     Other   Unintentional weight loss   Relevant Orders   Ambulatory referral to [place]   Other Visit Diagnoses     Chest pain, unspecified type    -  Primary   EKG normal concern for pneumonia. Will get her in for x-ray. Await results and treat as needed.   Relevant Orders   EKG 12-Lead (Completed)   Ambulatory referral to [place] loss       Family is quite concerned about her memory. MMSE was 30, but they are noticing a lot of differences. Will refer to neurology for further evaluation.   Relevant Orders   Ambulatory referral to Neurology   Ambulatory referral to Home Health   SOB (shortness of breath)       EKG normal concern for pneumonia. Will get her in for  x-ray. Await results and treat as needed.   Relevant Orders   Novel Coronavirus, NAA ([facility]) (Completed)   Veritor Flu A/B Waived (Completed)   DG Chest 2 View (Completed)   Ambulatory  referral to [place] frequently       REferral to home health placed today. Await their input.   Relevant Orders   Ambulatory referral to [place]        Follow up plan: Return in about 2 weeks (around 01/18/2022).

## 2022-01-06 ENCOUNTER — Encounter: Payer: Self-pay | Admitting: Urology

## 2022-01-06 ENCOUNTER — Ambulatory Visit (INDEPENDENT_AMBULATORY_CARE_PROVIDER_SITE_OTHER): Payer: Medicare Other | Admitting: Urology

## 2022-01-06 VITALS — BP 132/72 | HR 70 | Ht <= 58 in | Wt 89.0 lb

## 2022-01-06 DIAGNOSIS — R339 Retention of urine, unspecified: Secondary | ICD-10-CM

## 2022-01-06 DIAGNOSIS — R3911 Hesitancy of micturition: Secondary | ICD-10-CM

## 2022-01-06 LAB — BLADDER SCAN AMB NON-IMAGING: Scan Result: 0

## 2022-01-06 LAB — NOVEL CORONAVIRUS, NAA: SARS-CoV-2, NAA: NOT DETECTED

## 2022-01-06 NOTE — Progress Notes (Signed)
01/06/2022 8:22 PM   [name] 1950-09-20 [id number]  Referring provider: [name], DO [place],  [place] [address]  Chief Complaint  Patient presents with   Urinary Retention    HPI: Kristi Clark is a 71 y.o. female referred for evaluation of urinary retention.  Her daughter was with her today.  Seen in the [facility] ED 12/25/2021 with complaints of dysuria and vaginal discharge.  Urinalysis was unremarkable The patient states she went to the ED because she was unable to urinate for 5 hours and subsequently voided a large volume of urine though nothing in the [facility] notes mentions these symptoms Seen by PCP 12/27/2021 with a several week history of difficulty voiding, urinary frequency, urgency and small volume voids She has occasional nocturia Denies gross hematuria or flank, abdominal, pelvic pain No previous history of urologic problems   PMH: Past Medical History:  Diagnosis Date   AK (actinic keratosis) 10/25/2017   left forehead   BCC (basal cell carcinoma) 10/25/2017   left upper lateral eyelid, Moh's   COPD (chronic obstructive pulmonary disease) ([place])    Depression    GERD (gastroesophageal reflux disease)    Hyperlipidemia    Hypertension    Menopause    Osteopenia    Tobacco abuse     Surgical History: Past Surgical History:  Procedure Laterality Date   CARDIAC CATHETERIZATION     CHOLECYSTECTOMY  2014   COLONOSCOPY WITH PROPOFOL N/A 01/16/2018   Procedure: COLONOSCOPY WITH PROPOFOL;  Surgeon: Lollie Sails, MD;  Location: [facility] ENDOSCOPY;  Service: Endoscopy;  Laterality: N/A;   ESOPHAGOGASTRODUODENOSCOPY (EGD) WITH PROPOFOL N/A 01/16/2018   Procedure: ESOPHAGOGASTRODUODENOSCOPY (EGD) WITH PROPOFOL;  Surgeon: Lollie Sails, MD;  Location:  [facility] ENDOSCOPY;  Service: Endoscopy;  Laterality: N/A;   heart stent     LEFT HEART CATH AND CORONARY ANGIOGRAPHY N/A 09/17/2021   Procedure: LEFT HEART CATH AND CORONARY  ANGIOGRAPHY;  Surgeon: Dionisio David, MD;  Location: [facility] CV LAB;  Service: Cardiovascular;  Laterality: N/A;   TEE WITHOUT CARDIOVERSION N/A 05/11/2021   Procedure: TRANSESOPHAGEAL ECHOCARDIOGRAM (TEE);  Surgeon: Corey Skains, MD;  Location: [facility] ORS;  Service: Cardiovascular;  Laterality: N/A;   TOTAL ABDOMINAL HYSTERECTOMY      Home Medications:  Allergies as of 01/06/2022       Reactions   Clindamycin/lincomycin Hives   Prednisone Other (See Comments)   Psychosis   Amoxicillin Rash   Avelox [moxifloxacin Hcl In Nacl] Rash   Codeine Sulfate Nausea Only   Penicillins Rash   Sulfa Antibiotics Rash        Medication List        Accurate as of January 06, 2022  8:22 PM. If you have any questions, ask your nurse or doctor.          albuterol 108 (90 Base) MCG/ACT inhaler Commonly known as: VENTOLIN HFA Inhale 2 puffs into the lungs every 6 (six) hours as needed for wheezing or shortness of breath.   albuterol (2.5 MG/3ML) 0.083% nebulizer solution Commonly known as: PROVENTIL Take 3 mLs (2.5 mg total) by nebulization every 6 (six) hours as needed for wheezing or shortness of breath.   aspirin 81 MG tablet Take 81 mg by mouth daily.   atorvastatin 40 MG tablet Commonly known as: LIPITOR Take 1 tablet (40 mg total) by mouth daily.   Blood Pressure Monitor/Wrist Kit Take Blood pressure as needed, Dx: I12.9   CENTRUM SILVER ADULT  50+ PO Take 1 tablet by mouth daily.   PreserVision AREDS 2+Multi Vit Caps Take 1 capsule by mouth at bedtime.   clopidogrel 75 MG tablet Commonly known as: PLAVIX Take 1 tablet (75 mg total) by mouth daily.   fluticasone 50 MCG/ACT nasal spray Commonly known as: FLONASE Place 2 sprays into both nostrils daily.   fluticasone-salmeterol 250-50 MCG/ACT Aepb Commonly known as: Advair Diskus Inhale 1 puff into the lungs in the morning and at bedtime.   hydrALAZINE 25 MG tablet Commonly known as: APRESOLINE Take 0.5  tablets (12.5 mg total) by mouth 2 (two) times daily.   hydrochlorothiazide 12.5 MG capsule Commonly known as: MICROZIDE Take 1 capsule (12.5 mg total) by mouth daily as needed.   isosorbide mononitrate 60 MG 24 hr tablet Commonly known as: IMDUR Take 1 tablet (60 mg total) by mouth 2 (two) times daily.   losartan 25 MG tablet Commonly known as: COZAAR Take 1 tablet (25 mg total) by mouth daily.   metoprolol succinate 50 MG 24 hr tablet Commonly known as: TOPROL-XL Take 1 tablet (50 mg total) by mouth daily. Take with or immediately following a meal.   mirtazapine 45 MG tablet Commonly known as: REMERON Take 1 tablet (45 mg total) by mouth at bedtime.   nystatin 100000 UNIT/ML suspension Commonly known as: MYCOSTATIN Take 5 mLs (500,000 Units total) by mouth 4 (four) times daily.   ondansetron 4 MG tablet Commonly known as: Zofran Take 1 tablet (4 mg total) by mouth every 8 (eight) hours as needed for nausea or vomiting.   polyethylene glycol powder 17 GM/SCOOP powder Commonly known as: GLYCOLAX/MIRALAX Take 17 g by mouth daily as needed for moderate constipation.   PROBIOTIC-10 PO Take by mouth. Every other day   saline Gel Place 1 Application into both nostrils every 4 (four) hours as needed.   tiotropium 18 MCG inhalation capsule Commonly known as: SPIRIVA Place 1 capsule (18 mcg total) into inhaler and inhale daily.   ZYRTEC PO Take by mouth daily.        Allergies:  Allergies  Allergen Reactions   Clindamycin/Lincomycin Hives   Prednisone Other (See Comments)    Psychosis   Amoxicillin Rash   Avelox [Moxifloxacin Hcl In Nacl] Rash   Codeine Sulfate Nausea Only   Penicillins Rash   Sulfa Antibiotics Rash    Family History: Family History  Problem Relation Age of Onset   Cancer Mother    Stroke Mother    Heart disease Father    Hyperlipidemia Father    Cancer Sister    Diabetes Sister    Breast cancer Sister 22   Diabetes Brother    Asthma  Son    Cancer Son    Diabetes Daughter    Hypertension Daughter    Cancer Maternal Grandmother        gallbladder   Diabetes Brother    Heart disease Brother    Breast cancer Paternal Aunt     Social History:  reports that she has been smoking cigarettes. She has a 56.00 pack-year smoking history. She has never used smokeless tobacco. She reports that she does not drink alcohol and does not use drugs.   Physical Exam: BP 132/72   Pulse 70   Ht _0  (1.473 m)   Wt 89 lb (40.4 kg)   LMP  (LMP Unknown)   BMI 18.60 kg/m   Constitutional:  Alert and oriented, No acute distress. HEENT: [place] AT Respiratory: Normal respiratory effort,  no increased work of breathing. Psychiatric: Normal mood and affect.   Assessment & Plan:    1.  Lower urinary tract symptoms PVR today was 0 mL Within the past week her symptoms are not bothersome Recommend follow-up visit as needed for recurrent symptoms   Abbie Sons, MD  [facility] [address], [place], [place] [address] [phone]

## 2022-01-09 ENCOUNTER — Encounter: Payer: Self-pay | Admitting: Urology

## 2022-01-09 ENCOUNTER — Other Ambulatory Visit: Payer: Self-pay | Admitting: Family Medicine

## 2022-01-09 MED ORDER — DOXYCYCLINE HYCLATE 100 MG PO TABS: 100.0000 mg | ORAL_TABLET | Freq: Two times a day (BID) | ORAL | 0 refills | Status: DC

## 2022-01-10 ENCOUNTER — Other Ambulatory Visit: Payer: Self-pay | Admitting: Family Medicine

## 2022-01-10 ENCOUNTER — Encounter: Payer: Self-pay | Admitting: Family Medicine

## 2022-01-10 ENCOUNTER — Telehealth: Payer: Self-pay | Admitting: Family Medicine

## 2022-01-10 NOTE — Progress Notes (Signed)
Interpreted by me on 01/04/22. NSR at 87bpm. No ST segment changes. Poor baseline

## 2022-01-10 NOTE — Telephone Encounter (Signed)
Patient daughter is aware of home health orders being placed.  Has not yet picked up antibiotic, will pick up today.

## 2022-01-10 NOTE — Telephone Encounter (Signed)
Copied from [place] [id number]. Topic: General - Other >> Jan 10, 2022 12:43 PM Sabas Sous wrote: Reason for CRM: Pt's duaghter is interested in home health services for the patient, please advise   Best contact: [phone]

## 2022-01-10 NOTE — Telephone Encounter (Signed)
All set. Please make sure she picked up her antibiotic. Thanks!

## 2022-01-10 NOTE — Telephone Encounter (Signed)
Medication Refill - Medication: clopidogrel (PLAVIX) 75 MG tablet , metoprolol succinate (TOPROL-XL) 50 MG 24 hr tablet   Has the patient contacted their pharmacy? Yes.     Preferred Pharmacy (with phone number or street name):  [facility] [place], [place] - [address] Phone: [phone]  Fax: [phone]     Has the patient been seen for an appointment in the last year OR does the patient have an upcoming appointment? Yes.    Please assist patient further

## 2022-01-11 DIAGNOSIS — J441 Chronic obstructive pulmonary disease with (acute) exacerbation: Secondary | ICD-10-CM | POA: Diagnosis not present

## 2022-01-11 DIAGNOSIS — Z7951 Long term (current) use of inhaled steroids: Secondary | ICD-10-CM | POA: Diagnosis not present

## 2022-01-11 DIAGNOSIS — N182 Chronic kidney disease, stage 2 (mild): Secondary | ICD-10-CM | POA: Diagnosis not present

## 2022-01-11 DIAGNOSIS — R911 Solitary pulmonary nodule: Secondary | ICD-10-CM | POA: Diagnosis not present

## 2022-01-11 DIAGNOSIS — I700 Atherosclerosis of aorta: Secondary | ICD-10-CM | POA: Diagnosis not present

## 2022-01-11 DIAGNOSIS — R54 Age-related physical debility: Secondary | ICD-10-CM | POA: Diagnosis not present

## 2022-01-11 DIAGNOSIS — D631 Anemia in chronic kidney disease: Secondary | ICD-10-CM | POA: Diagnosis not present

## 2022-01-11 DIAGNOSIS — I130 Hypertensive heart and chronic kidney disease with heart failure and stage 1 through stage 4 chronic kidney disease, or unspecified chronic kidney disease: Secondary | ICD-10-CM | POA: Diagnosis not present

## 2022-01-11 DIAGNOSIS — E440 Moderate protein-calorie malnutrition: Secondary | ICD-10-CM | POA: Diagnosis not present

## 2022-01-11 DIAGNOSIS — R413 Other amnesia: Secondary | ICD-10-CM | POA: Diagnosis not present

## 2022-01-11 DIAGNOSIS — K219 Gastro-esophageal reflux disease without esophagitis: Secondary | ICD-10-CM | POA: Diagnosis not present

## 2022-01-11 DIAGNOSIS — F17219 Nicotine dependence, cigarettes, with unspecified nicotine-induced disorders: Secondary | ICD-10-CM | POA: Diagnosis not present

## 2022-01-11 DIAGNOSIS — R079 Chest pain, unspecified: Secondary | ICD-10-CM | POA: Diagnosis not present

## 2022-01-11 DIAGNOSIS — I509 Heart failure, unspecified: Secondary | ICD-10-CM | POA: Diagnosis not present

## 2022-01-11 DIAGNOSIS — Z7902 Long term (current) use of antithrombotics/antiplatelets: Secondary | ICD-10-CM | POA: Diagnosis not present

## 2022-01-11 DIAGNOSIS — J309 Allergic rhinitis, unspecified: Secondary | ICD-10-CM | POA: Diagnosis not present

## 2022-01-11 DIAGNOSIS — F32A Depression, unspecified: Secondary | ICD-10-CM | POA: Diagnosis not present

## 2022-01-11 DIAGNOSIS — R296 Repeated falls: Secondary | ICD-10-CM | POA: Diagnosis not present

## 2022-01-11 DIAGNOSIS — Z9981 Dependence on supplemental oxygen: Secondary | ICD-10-CM | POA: Diagnosis not present

## 2022-01-11 DIAGNOSIS — J9621 Acute and chronic respiratory failure with hypoxia: Secondary | ICD-10-CM | POA: Diagnosis not present

## 2022-01-11 DIAGNOSIS — E785 Hyperlipidemia, unspecified: Secondary | ICD-10-CM | POA: Diagnosis not present

## 2022-01-11 DIAGNOSIS — I4891 Unspecified atrial fibrillation: Secondary | ICD-10-CM | POA: Diagnosis not present

## 2022-01-11 DIAGNOSIS — R1310 Dysphagia, unspecified: Secondary | ICD-10-CM | POA: Diagnosis not present

## 2022-01-11 DIAGNOSIS — R634 Abnormal weight loss: Secondary | ICD-10-CM | POA: Diagnosis not present

## 2022-01-11 DIAGNOSIS — I2510 Atherosclerotic heart disease of native coronary artery without angina pectoris: Secondary | ICD-10-CM | POA: Diagnosis not present

## 2022-01-11 MED ORDER — CLOPIDOGREL BISULFATE 75 MG PO TABS: 75.0000 mg | ORAL_TABLET | Freq: Every day | ORAL | 0 refills | Status: DC

## 2022-01-11 MED ORDER — METOPROLOL SUCCINATE ER 50 MG PO TB24: 50.0000 mg | ORAL_TABLET | Freq: Every day | ORAL | 0 refills | Status: DC

## 2022-01-11 NOTE — Telephone Encounter (Signed)
Requested Prescriptions  Pending Prescriptions Disp Refills   clopidogrel (PLAVIX) 75 MG tablet 90 tablet 0    Sig: Take 1 tablet (75 mg total) by mouth daily.     Hematology: Antiplatelets - clopidogrel Failed - 01/10/2022  5:25 PM      Failed - HCT in normal range and within 180 days    Hematocrit  Date Value Ref Range Status  09/21/2021 27.4 (L) 34.0 - 46.6 % Final         Failed - HGB in normal range and within 180 days    Hemoglobin  Date Value Ref Range Status  09/21/2021 9.1 (L) 11.1 - 15.9 g/dL Final         Passed - PLT in normal range and within 180 days    Platelets  Date Value Ref Range Status  09/21/2021 391 150 - 450 x10E3/uL Final         Passed - Cr in normal range and within 360 days    Creatinine  Date Value Ref Range Status  02/11/2013 0.95 0.60 - 1.30 mg/dL Final   Creatinine, Ser  Date Value Ref Range Status  09/21/2021 0.73 0.57 - 1.00 mg/dL Final         Passed - Valid encounter within last 6 months    Recent Outpatient Visits           1 week ago Chest pain, unspecified type   [facility], Megan P, DO   2 weeks ago Urinary retention   [place], Megan P, DO   3 weeks ago Weight loss   [place], [place], DO   1 month ago COPD exacerbation ([place])   [place], Megan P, DO   1 month ago COPD exacerbation [facility])   [facility] [place], [place], DO       Future Appointments             In 1 week Johnson, Megan P, DO [facility]   In 3 weeks Johnson, Megan P, DO [facility]   In 1 month Johnson, Megan P, DO [facility]             metoprolol succinate (TOPROL-XL) 50 MG 24 hr tablet 90 tablet 0    Sig: Take 1 tablet (50 mg total) by mouth daily. Take with or immediately following a meal.     Cardiovascular:  Beta Blockers Passed - 01/10/2022  5:25 PM      Passed - Last BP in normal  range    BP Readings from Last 1 Encounters:  01/06/22 132/72         Passed - Last Heart Rate in normal range    Pulse Readings from Last 1 Encounters:  01/06/22 70         Passed - Valid encounter within last 6 months    Recent Outpatient Visits           1 week ago Chest pain, unspecified type   [facility], Megan P, DO   2 weeks ago Urinary retention   [place], Megan P, DO   3 weeks ago Weight loss   [facility], [place], DO   1 month ago COPD exacerbation [facility])   [place], Megan P, DO   1 month ago COPD exacerbation [facility])   [place], [place], DO  Future Appointments             In 1 week Wynetta Emery, Barb Merino, DO [facility], [place]   In 3 weeks Wynetta Emery, Barb Merino, DO [facility] [facility], [place]   In 1 month [place], Barb Merino, DO [facility]

## 2022-01-12 ENCOUNTER — Ambulatory Visit: Payer: Self-pay | Admitting: *Deleted

## 2022-01-12 ENCOUNTER — Telehealth: Payer: Self-pay | Admitting: Family Medicine

## 2022-01-12 ENCOUNTER — Ambulatory Visit (INDEPENDENT_AMBULATORY_CARE_PROVIDER_SITE_OTHER): Payer: Medicare Other | Admitting: Physician Assistant

## 2022-01-12 ENCOUNTER — Encounter: Payer: Self-pay | Admitting: Physician Assistant

## 2022-01-12 VITALS — BP 114/65 | HR 86 | Temp 98.5°F | Ht <= 58 in | Wt 91.8 lb

## 2022-01-12 DIAGNOSIS — J449 Chronic obstructive pulmonary disease, unspecified: Secondary | ICD-10-CM

## 2022-01-12 DIAGNOSIS — J189 Pneumonia, unspecified organism: Secondary | ICD-10-CM | POA: Diagnosis not present

## 2022-01-12 NOTE — Telephone Encounter (Signed)
Summary: Mucinex   Patient had appointment today and unsure if PCP advised if she can take the 12 hour Mucinex or if she should only take the Mucinex during the day.     You can take Mucinex to help with thinning out the mucus- make sure you drink plenty of water with this  You can take your Delsym to help with coughing especially at night Reason for Disposition  Caller has medicine question only, adult not sick, AND triager answers question  Answer Assessment - Initial Assessment Questions 1. NAME of MEDICINE: "What medicine(s) are you calling about?"     Mucinex and Delsym 2. QUESTION: "What is your question?" (e.g., double dose of medicine, side effect)     Is patient supposed to only take Mucinex during the day and only Delsym at night? 3. PRESCRIBER: "Who prescribed the medicine?" Reason: if prescribed by specialist, call should be referred to that group.     PCP OV note relayed to caregiver- will do Mucinex during day and Delsym at night. Patient has only been taking half dose of Delsym at night- is that ok? Advised if it is working to help patient rest then yes- if not may use whole dose. Reminded to drink plenty of water during the day.  Call will be sent to PCP for any further instructions or changes  Protocols used: Medication Question Call-A-AH

## 2022-01-12 NOTE — Telephone Encounter (Signed)
Collie Siad with [facility] is calling in to report admission for pt for skilled nursing   Frequency: 1 week 9   PT & OT will be following up to request their own orders/ frequency.   Phone: [phone]

## 2022-01-12 NOTE — Patient Instructions (Signed)
You can take Mucinex to help with thinning out the mucus- make sure you drink plenty of water with this  You can take your Delsym to help with coughing especially at night   You should continue to take the Doxycycline as directed to help with the pneumonia   If you are still having trouble breathing, your oxygen saturation drops below 90 % please go to the hospital for evaluation

## 2022-01-12 NOTE — Progress Notes (Unsigned)
Acute Office Visit   Patient: Kristi Clark   DOB: 1950/09/01   71 y.o. Female  MRN: [id number] Visit Date: 01/12/2022  Today's healthcare provider: Dani Gobble Kendalynn Wideman, PA-C  Introduced myself to the patient as a Journalist, newspaper and provided education on APPs in clinical practice.    Chief Complaint  Patient presents with   Shortness of Breath    Started a few days ago   Cough    Just picked up Doxycycline  yesterday that was ordered by Dr. Wynetta Emery   Subjective    Shortness of Breath Associated symptoms include a fever.  Cough Associated symptoms include a fever and shortness of breath. Pertinent negatives include no chills.   HPI     Shortness of Breath    Additional comments: Started a few days ago        Cough    Additional comments: Just picked up Doxycycline  yesterday that was ordered by Dr. Wynetta Emery      Last edited by Jerelene Redden, CMA on 01/12/2022 10:41 AM.      Patient was seen for SOB and COPD on 01/04/22  She had CXR performed that day which had findings concerning for infection Doxycycline was ordered on 01/06/22 but was not started until last night  She reports she is having a "choking cough"  States she is able to get some sputum up but does not feel like much Reports she has had the cough for a while States when she tries to cough mucus up it feels like it is getting stuck in her throat and choking her  Feels like her breathing was a bit more labored this AM  She has been using the nebulizer twice per day and has not been using her rescue inhaler very much   She states she is taking her daily inhalers as directed  She monitors her O2 at home -  reports it is usually around 99 -100, has dropped to 92  She is on continuous O2 - 2L via cannula  Her daughter states she had a fever last night of 99.9    Medications: Outpatient Medications Prior to Visit  Medication Sig   albuterol (PROVENTIL) (2.5 MG/3ML) 0.083% nebulizer solution Take 3 mLs  (2.5 mg total) by nebulization every 6 (six) hours as needed for wheezing or shortness of breath.   albuterol (VENTOLIN HFA) 108 (90 Base) MCG/ACT inhaler Inhale 2 puffs into the lungs every 6 (six) hours as needed for wheezing or shortness of breath.   aspirin 81 MG tablet Take 81 mg by mouth daily.   atorvastatin (LIPITOR) 40 MG tablet Take 1 tablet (40 mg total) by mouth daily.   Blood Pressure Monitoring (BLOOD PRESSURE MONITOR/WRIST) KIT Take Blood pressure as needed, Dx: I12.9   Cetirizine HCl (ZYRTEC PO) Take by mouth daily.   clopidogrel (PLAVIX) 75 MG tablet Take 1 tablet (75 mg total) by mouth daily.   doxycycline (VIBRA-TABS) 100 MG tablet Take 1 tablet (100 mg total) by mouth 2 (two) times daily.   fluticasone (FLONASE) 50 MCG/ACT nasal spray Place 2 sprays into both nostrils daily.   fluticasone-salmeterol (ADVAIR DISKUS) 250-50 MCG/ACT AEPB Inhale 1 puff into the lungs in the morning and at bedtime.   hydrALAZINE (APRESOLINE) 25 MG tablet Take 0.5 tablets (12.5 mg total) by mouth 2 (two) times daily.   hydrochlorothiazide (MICROZIDE) 12.5 MG capsule Take 1 capsule (12.5 mg total) by mouth daily as needed.  isosorbide mononitrate (IMDUR) 60 MG 24 hr tablet Take 1 tablet (60 mg total) by mouth 2 (two) times daily.   losartan (COZAAR) 25 MG tablet Take 1 tablet (25 mg total) by mouth daily.   metoprolol succinate (TOPROL-XL) 50 MG 24 hr tablet Take 1 tablet (50 mg total) by mouth daily. Take with or immediately following a meal.   mirtazapine (REMERON) 45 MG tablet Take 1 tablet (45 mg total) by mouth at bedtime.   Multiple Vitamins-Minerals (CENTRUM SILVER ADULT 50+ PO) Take 1 tablet by mouth daily.   Multiple Vitamins-Minerals (PRESERVISION AREDS 2+MULTI VIT) CAPS Take 1 capsule by mouth at bedtime.   nystatin (MYCOSTATIN) 100000 UNIT/ML suspension Take 5 mLs (500,000 Units total) by mouth 4 (four) times daily.   ondansetron (ZOFRAN) 4 MG tablet Take 1 tablet (4 mg total) by mouth  every 8 (eight) hours as needed for nausea or vomiting.   polyethylene glycol powder (GLYCOLAX/MIRALAX) 17 GM/SCOOP powder Take 17 g by mouth daily as needed for moderate constipation.   Probiotic Product (PROBIOTIC-10 PO) Take by mouth. Every other day   saline (AYR) GEL Place 1 Application into both nostrils every 4 (four) hours as needed.   tiotropium (SPIRIVA) 18 MCG inhalation capsule Place 1 capsule (18 mcg total) into inhaler and inhale daily.   Facility-Administered Medications Prior to Visit  Medication Dose Route Frequency Provider   albuterol (PROVENTIL) (2.5 MG/3ML) 0.083% nebulizer solution 2.5 mg  2.5 mg Nebulization Once Johnson, Megan P, DO   sodium chloride flush (NS) 0.9 % injection 3 mL  3 mL Intravenous Q12H Scoggins, Amber, NP   triamcinolone acetonide (KENALOG-40) injection 40 mg  40 mg Intramuscular Once Johnson, Megan P, DO    Review of Systems  Constitutional:  Positive for fever. Negative for chills.  Respiratory:  Positive for cough and shortness of breath.   Neurological:  Negative for dizziness and facial asymmetry.    {Labs  Heme  Chem  Endocrine  Serology  Results Review (optional):23779}   Objective    BP 114/65   Pulse 86   Temp 98.5 F (36.9 C) (Oral)   Ht 4' 9.99" (1.473 m)   Wt 91 lb 12.8 oz (41.6 kg)   LMP  (LMP Unknown)   SpO2 96%   BMI 19.19 kg/m  {Show previous vital signs (optional):23777}  Physical Exam Pulmonary:     Effort: Tachypnea and accessory muscle usage present.     Breath sounds: Decreased air movement present. Examination of the right-upper field reveals rales. Examination of the right-middle field reveals decreased breath sounds and rales. Examination of the left-middle field reveals decreased breath sounds. Decreased breath sounds and rales present. No wheezing.       No results found for any visits on 01/12/22.  Assessment & Plan      No follow-ups on file.

## 2022-01-12 NOTE — Telephone Encounter (Signed)
Copied from [place] [id number]. Topic: General - Other >> Jan 12, 2022  3:31 PM Everette C wrote: Reason for CRM: [name] - [facility] would like to speak with a member of clinical staff when possible to review the patient's prescriptions  Please contact further when possible

## 2022-01-12 NOTE — Telephone Encounter (Signed)
Called number back for [facility], mailbox full, no answer.

## 2022-01-12 NOTE — Telephone Encounter (Signed)
  Chief Complaint: productive cough Symptoms: hard to breath because mucous thick Frequency: started on med for it last week after CXR but did not get med until yesterday Pertinent Negatives: Patient denies fever Disposition: '[]'$ ED /'[]'$ Urgent Care (no appt availability in office) / '[x]'$ Appointment(In office/virtual)/ '[]'$  [facility]/ '[]'$ Home Care/ '[]'$ Refused Recommended Disposition /'[]'$ [facility]/ '[]'$  Follow-up with PCP Additional Notes: Appt this morning, home care discussed.   Reason for Disposition  [1] MILD difficulty breathing (e.g., minimal/no SOB at rest, SOB with walking, pulse <100) AND [2] still present when not coughing  Answer Assessment - Initial Assessment Questions 1. ONSET: "When did the cough begin?"      Last week worsened, coughing quite a bit 2. SEVERITY: "How bad is the cough today?"      Frequent not always productive 3. SPUTUM: "Describe the color of your sputum" (none, dry cough; clear, white, yellow, green)     Grey, chokes her 4. HEMOPTYSIS: "Are you coughing up any blood?" If so ask: "How much?" (flecks, streaks, tablespoons, etc.)     no 5. DIFFICULTY BREATHING: "Are you having difficulty breathing?" If Yes, ask: "How bad is it?" (e.g., mild, moderate, severe)    - MILD: No SOB at rest, mild SOB with walking, speaks normally in sentences, can lie down, no retractions, pulse < 100.    - MODERATE: SOB at rest, SOB with minimal exertion and prefers to sit, cannot lie down flat, speaks in phrases, mild retractions, audible wheezing, pulse 100-120.    - SEVERE: Very SOB at rest, speaks in single words, struggling to breathe, sitting hunched forward, retractions, pulse > 120      SOB when coughs, sputum stuck 6. FEVER: "Do you have a fever?" If Yes, ask: "What is your temperature, how was it measured, and when did it start?"     no 7. CARDIAC HISTORY: "Do you have any history of heart disease?" (e.g., heart attack, congestive heart failure)     Copd  and stent 8. LUNG HISTORY: "Do you have any history of lung disease?"  (e.g., pulmonary embolus, asthma, emphysema)     Copd and emphysema, on oxygen 9. PE RISK FACTORS: "Do you have a history of blood clots?" (or: recent major surgery, recent prolonged travel, bedridden)     plavix 10. OTHER SYMPTOMS: "Do you have any other symptoms?" (e.g., runny nose, wheezing, chest pain)       Both coughing up and blowing out of nose 11. PREGNANCY: "Is there any chance you are pregnant?" "When was your last menstrual period?"       na 12. TRAVEL: "Have you traveled out of the country in the last month?" (e.g., travel history, exposures)       na  Protocols used: Cough - Acute Productive-A-AH

## 2022-01-13 ENCOUNTER — Inpatient Hospital Stay: Payer: Medicare Other

## 2022-01-13 ENCOUNTER — Other Ambulatory Visit: Payer: Self-pay

## 2022-01-13 ENCOUNTER — Emergency Department: Payer: Medicare Other

## 2022-01-13 ENCOUNTER — Encounter: Payer: Self-pay | Admitting: Emergency Medicine

## 2022-01-13 ENCOUNTER — Inpatient Hospital Stay
Admission: EM | Admit: 2022-01-13 | Discharge: 2022-01-21 | DRG: 190 | Disposition: A | Discharge status: Home Health | Payer: Medicare Other | Attending: Internal Medicine | Admitting: Internal Medicine

## 2022-01-13 DIAGNOSIS — F32A Depression, unspecified: Secondary | ICD-10-CM | POA: Diagnosis present

## 2022-01-13 DIAGNOSIS — Z7189 Other specified counseling: Secondary | ICD-10-CM

## 2022-01-13 DIAGNOSIS — R339 Retention of urine, unspecified: Secondary | ICD-10-CM | POA: Diagnosis present

## 2022-01-13 DIAGNOSIS — R634 Abnormal weight loss: Secondary | ICD-10-CM | POA: Diagnosis not present

## 2022-01-13 DIAGNOSIS — Z880 Allergy status to penicillin: Secondary | ICD-10-CM

## 2022-01-13 DIAGNOSIS — Z79899 Other long term (current) drug therapy: Secondary | ICD-10-CM

## 2022-01-13 DIAGNOSIS — I4891 Unspecified atrial fibrillation: Secondary | ICD-10-CM | POA: Diagnosis not present

## 2022-01-13 DIAGNOSIS — Z515 Encounter for palliative care: Secondary | ICD-10-CM

## 2022-01-13 DIAGNOSIS — K2960 Other gastritis without bleeding: Secondary | ICD-10-CM | POA: Diagnosis present

## 2022-01-13 DIAGNOSIS — R824 Acetonuria: Secondary | ICD-10-CM | POA: Diagnosis not present

## 2022-01-13 DIAGNOSIS — R0602 Shortness of breath: Principal | ICD-10-CM

## 2022-01-13 DIAGNOSIS — J439 Emphysema, unspecified: Secondary | ICD-10-CM | POA: Diagnosis not present

## 2022-01-13 DIAGNOSIS — Z9981 Dependence on supplemental oxygen: Secondary | ICD-10-CM

## 2022-01-13 DIAGNOSIS — J449 Chronic obstructive pulmonary disease, unspecified: Secondary | ICD-10-CM

## 2022-01-13 DIAGNOSIS — Z20822 Contact with and (suspected) exposure to covid-19: Secondary | ICD-10-CM | POA: Diagnosis present

## 2022-01-13 DIAGNOSIS — Z8616 Personal history of COVID-19: Secondary | ICD-10-CM | POA: Diagnosis not present

## 2022-01-13 DIAGNOSIS — Z90710 Acquired absence of both cervix and uterus: Secondary | ICD-10-CM

## 2022-01-13 DIAGNOSIS — I480 Paroxysmal atrial fibrillation: Secondary | ICD-10-CM | POA: Diagnosis not present

## 2022-01-13 DIAGNOSIS — E785 Hyperlipidemia, unspecified: Secondary | ICD-10-CM | POA: Diagnosis present

## 2022-01-13 DIAGNOSIS — E222 Syndrome of inappropriate secretion of antidiuretic hormone: Secondary | ICD-10-CM | POA: Diagnosis present

## 2022-01-13 DIAGNOSIS — J441 Chronic obstructive pulmonary disease with (acute) exacerbation: Principal | ICD-10-CM | POA: Diagnosis present

## 2022-01-13 DIAGNOSIS — R809 Proteinuria, unspecified: Secondary | ICD-10-CM | POA: Diagnosis not present

## 2022-01-13 DIAGNOSIS — J309 Allergic rhinitis, unspecified: Secondary | ICD-10-CM | POA: Diagnosis present

## 2022-01-13 DIAGNOSIS — D649 Anemia, unspecified: Secondary | ICD-10-CM | POA: Diagnosis present

## 2022-01-13 DIAGNOSIS — R1312 Dysphagia, oropharyngeal phase: Secondary | ICD-10-CM | POA: Diagnosis present

## 2022-01-13 DIAGNOSIS — Z825 Family history of asthma and other chronic lower respiratory diseases: Secondary | ICD-10-CM

## 2022-01-13 DIAGNOSIS — E871 Hypo-osmolality and hyponatremia: Secondary | ICD-10-CM | POA: Diagnosis not present

## 2022-01-13 DIAGNOSIS — I959 Hypotension, unspecified: Secondary | ICD-10-CM | POA: Diagnosis not present

## 2022-01-13 DIAGNOSIS — I129 Hypertensive chronic kidney disease with stage 1 through stage 4 chronic kidney disease, or unspecified chronic kidney disease: Secondary | ICD-10-CM | POA: Diagnosis present

## 2022-01-13 DIAGNOSIS — J168 Pneumonia due to other specified infectious organisms: Secondary | ICD-10-CM | POA: Diagnosis not present

## 2022-01-13 DIAGNOSIS — J9621 Acute and chronic respiratory failure with hypoxia: Secondary | ICD-10-CM | POA: Diagnosis present

## 2022-01-13 DIAGNOSIS — K219 Gastro-esophageal reflux disease without esophagitis: Secondary | ICD-10-CM | POA: Diagnosis not present

## 2022-01-13 DIAGNOSIS — Z681 Body mass index (BMI) 19 or less, adult: Secondary | ICD-10-CM | POA: Diagnosis not present

## 2022-01-13 DIAGNOSIS — Z85828 Personal history of other malignant neoplasm of skin: Secondary | ICD-10-CM

## 2022-01-13 DIAGNOSIS — E861 Hypovolemia: Secondary | ICD-10-CM | POA: Diagnosis present

## 2022-01-13 DIAGNOSIS — N182 Chronic kidney disease, stage 2 (mild): Secondary | ICD-10-CM | POA: Diagnosis not present

## 2022-01-13 DIAGNOSIS — K5900 Constipation, unspecified: Secondary | ICD-10-CM | POA: Diagnosis present

## 2022-01-13 DIAGNOSIS — M8580 Other specified disorders of bone density and structure, unspecified site: Secondary | ICD-10-CM | POA: Diagnosis not present

## 2022-01-13 DIAGNOSIS — E440 Moderate protein-calorie malnutrition: Secondary | ICD-10-CM | POA: Diagnosis present

## 2022-01-13 DIAGNOSIS — Z8249 Family history of ischemic heart disease and other diseases of the circulatory system: Secondary | ICD-10-CM

## 2022-01-13 DIAGNOSIS — J189 Pneumonia, unspecified organism: Secondary | ICD-10-CM

## 2022-01-13 DIAGNOSIS — I700 Atherosclerosis of aorta: Secondary | ICD-10-CM | POA: Diagnosis present

## 2022-01-13 DIAGNOSIS — F410 Panic disorder [episodic paroxysmal anxiety] without agoraphobia: Secondary | ICD-10-CM | POA: Diagnosis present

## 2022-01-13 DIAGNOSIS — R7401 Elevation of levels of liver transaminase levels: Secondary | ICD-10-CM | POA: Diagnosis present

## 2022-01-13 DIAGNOSIS — I10 Essential (primary) hypertension: Secondary | ICD-10-CM | POA: Diagnosis present

## 2022-01-13 DIAGNOSIS — Z7902 Long term (current) use of antithrombotics/antiplatelets: Secondary | ICD-10-CM

## 2022-01-13 DIAGNOSIS — N393 Stress incontinence (female) (male): Secondary | ICD-10-CM | POA: Diagnosis present

## 2022-01-13 DIAGNOSIS — Z66 Do not resuscitate: Secondary | ICD-10-CM | POA: Diagnosis present

## 2022-01-13 DIAGNOSIS — I2510 Atherosclerotic heart disease of native coronary artery without angina pectoris: Secondary | ICD-10-CM | POA: Diagnosis not present

## 2022-01-13 DIAGNOSIS — D62 Acute posthemorrhagic anemia: Secondary | ICD-10-CM | POA: Diagnosis not present

## 2022-01-13 DIAGNOSIS — Z9049 Acquired absence of other specified parts of digestive tract: Secondary | ICD-10-CM

## 2022-01-13 DIAGNOSIS — R9431 Abnormal electrocardiogram [ECG] [EKG]: Secondary | ICD-10-CM | POA: Diagnosis not present

## 2022-01-13 DIAGNOSIS — Z0389 Encounter for observation for other suspected diseases and conditions ruled out: Secondary | ICD-10-CM | POA: Diagnosis not present

## 2022-01-13 DIAGNOSIS — Z7982 Long term (current) use of aspirin: Secondary | ICD-10-CM

## 2022-01-13 DIAGNOSIS — R0689 Other abnormalities of breathing: Secondary | ICD-10-CM | POA: Diagnosis not present

## 2022-01-13 DIAGNOSIS — F17210 Nicotine dependence, cigarettes, uncomplicated: Secondary | ICD-10-CM | POA: Diagnosis present

## 2022-01-13 DIAGNOSIS — Z955 Presence of coronary angioplasty implant and graft: Secondary | ICD-10-CM

## 2022-01-13 DIAGNOSIS — Z881 Allergy status to other antibiotic agents status: Secondary | ICD-10-CM

## 2022-01-13 DIAGNOSIS — Z882 Allergy status to sulfonamides status: Secondary | ICD-10-CM

## 2022-01-13 DIAGNOSIS — Z885 Allergy status to narcotic agent status: Secondary | ICD-10-CM

## 2022-01-13 LAB — CBC WITH DIFFERENTIAL/PLATELET
Abs Immature Granulocytes: 0.05 10*3/uL (ref 0.00–0.07)
Basophils Absolute: 0 10*3/uL (ref 0.0–0.1)
Basophils Relative: 0 %
Eosinophils Absolute: 0 10*3/uL (ref 0.0–0.5)
Eosinophils Relative: 0 %
HCT: 24.6 % — ABNORMAL LOW (ref 36.0–46.0)
Hemoglobin: 8.1 g/dL — ABNORMAL LOW (ref 12.0–15.0)
Immature Granulocytes: 1 %
Lymphocytes Relative: 13 %
Lymphs Abs: 1.1 10*3/uL (ref 0.7–4.0)
MCH: 28.9 pg (ref 26.0–34.0)
MCHC: 32.9 g/dL (ref 30.0–36.0)
MCV: 87.9 fL (ref 80.0–100.0)
Monocytes Absolute: 0.6 10*3/uL (ref 0.1–1.0)
Monocytes Relative: 7 %
Neutro Abs: 6.5 10*3/uL (ref 1.7–7.7)
Neutrophils Relative %: 79 %
Platelets: 319 10*3/uL (ref 150–400)
RBC: 2.8 MIL/uL — ABNORMAL LOW (ref 3.87–5.11)
RDW: 17.4 % — ABNORMAL HIGH (ref 11.5–15.5)
WBC: 8.3 10*3/uL (ref 4.0–10.5)
nRBC: 0 % (ref 0.0–0.2)

## 2022-01-13 LAB — COMPREHENSIVE METABOLIC PANEL
ALT: 52 U/L — ABNORMAL HIGH (ref 0–44)
AST: 51 U/L — ABNORMAL HIGH (ref 15–41)
Albumin: 2.6 g/dL — ABNORMAL LOW (ref 3.5–5.0)
Alkaline Phosphatase: 96 U/L (ref 38–126)
Anion gap: 6 (ref 5–15)
BUN: 15 mg/dL (ref 8–23)
CO2: 28 mmol/L (ref 22–32)
Calcium: 8.6 mg/dL — ABNORMAL LOW (ref 8.9–10.3)
Chloride: 90 mmol/L — ABNORMAL LOW (ref 98–111)
Creatinine, Ser: 0.53 mg/dL (ref 0.44–1.00)
GFR, Estimated: >60 mL/min (ref >60)
Glucose, Bld: 107 mg/dL — ABNORMAL HIGH (ref 70–99)
Potassium: 3.7 mmol/L (ref 3.5–5.1)
Sodium: 124 mmol/L — ABNORMAL LOW (ref 135–145)
Total Bilirubin: 0.6 mg/dL (ref 0.3–1.2)
Total Protein: 6 g/dL — ABNORMAL LOW (ref 6.5–8.1)

## 2022-01-13 LAB — MAGNESIUM: Magnesium: 1.6 mg/dL — ABNORMAL LOW (ref 1.7–2.4)

## 2022-01-13 LAB — URINALYSIS, ROUTINE W REFLEX MICROSCOPIC
Bacteria, UA: NONE SEEN
Bilirubin Urine: NEGATIVE
Glucose, UA: NEGATIVE mg/dL
Hgb urine dipstick: NEGATIVE
Ketones, ur: 5 mg/dL — AB
Leukocytes,Ua: NEGATIVE
Nitrite: NEGATIVE
Protein, ur: 30 mg/dL — AB
Specific Gravity, Urine: 1.011 (ref 1.005–1.030)
pH: 6 (ref 5.0–8.0)

## 2022-01-13 LAB — SODIUM: Sodium: 125 mmol/L — ABNORMAL LOW (ref 135–145)

## 2022-01-13 LAB — LACTIC ACID, PLASMA
Lactic Acid, Venous: 0.7 mmol/L (ref 0.5–1.9)
Lactic Acid, Venous: 0.9 mmol/L (ref 0.5–1.9)

## 2022-01-13 LAB — PROCALCITONIN: Procalcitonin: <0.1 ng/mL

## 2022-01-13 LAB — TROPONIN I (HIGH SENSITIVITY)
Troponin I (High Sensitivity): 9 ng/L (ref <18)
Troponin I (High Sensitivity): 10 ng/L (ref <18)

## 2022-01-13 LAB — RESP PANEL BY RT-PCR (FLU A&B, COVID) ARPGX2
Influenza A by PCR: NEGATIVE
Influenza B by PCR: NEGATIVE
SARS Coronavirus 2 by RT PCR: NEGATIVE

## 2022-01-13 LAB — D-DIMER, QUANTITATIVE: D-Dimer, Quant: 2.48 ug/mL-FEU — ABNORMAL HIGH (ref 0.00–0.50)

## 2022-01-13 LAB — LIPASE, BLOOD: Lipase: 67 U/L — ABNORMAL HIGH (ref 11–51)

## 2022-01-13 MED ORDER — POLYETHYLENE GLYCOL 3350 17 G PO PACK: 17.0000 g | PACK | Freq: Every day | ORAL | Status: DC | PRN

## 2022-01-13 MED ORDER — SODIUM CHLORIDE 0.9 % IV SOLN
100.0000 mg | Freq: Once | INTRAVENOUS | Status: AC
  Administered 2022-01-13: 100 mg via INTRAVENOUS
  Filled 2022-01-13: qty 100

## 2022-01-13 MED ORDER — CLOPIDOGREL BISULFATE 75 MG PO TABS
75.0000 mg | ORAL_TABLET | Freq: Every day | ORAL | Status: DC
  Administered 2022-01-13 – 2022-01-14 (×2): 75 mg via ORAL
  Filled 2022-01-13 (×2): qty 1

## 2022-01-13 MED ORDER — IPRATROPIUM BROMIDE 0.02 % IN SOLN
0.5000 mg | Freq: Four times a day (QID) | RESPIRATORY_TRACT | Status: DC
  Administered 2022-01-13 – 2022-01-14 (×4): 0.5 mg via RESPIRATORY_TRACT
  Filled 2022-01-13 (×4): qty 2.5

## 2022-01-13 MED ORDER — ASPIRIN 81 MG PO TBEC
81.0000 mg | DELAYED_RELEASE_TABLET | Freq: Every day | ORAL | Status: DC
  Administered 2022-01-14 – 2022-01-16 (×3): 81 mg via ORAL
  Filled 2022-01-13 (×3): qty 1

## 2022-01-13 MED ORDER — NICOTINE 14 MG/24HR TD PT24
14.0000 mg | MEDICATED_PATCH | Freq: Every day | TRANSDERMAL | Status: DC
  Administered 2022-01-13 – 2022-01-20 (×8): 14 mg via TRANSDERMAL
  Filled 2022-01-13 (×8): qty 1

## 2022-01-13 MED ORDER — HYDRALAZINE HCL 25 MG PO TABS
12.5000 mg | ORAL_TABLET | Freq: Two times a day (BID) | ORAL | Status: DC
  Administered 2022-01-13 – 2022-01-14 (×3): 12.5 mg via ORAL
  Filled 2022-01-13: qty 0.5
  Filled 2022-01-13: qty 1
  Filled 2022-01-13: qty 0.5
  Filled 2022-01-13: qty 1

## 2022-01-13 MED ORDER — BUDESONIDE 0.5 MG/2ML IN SUSP
0.5000 mg | Freq: Two times a day (BID) | RESPIRATORY_TRACT | Status: DC
  Administered 2022-01-13 – 2022-01-14 (×3): 0.5 mg via RESPIRATORY_TRACT
  Filled 2022-01-13 (×3): qty 2

## 2022-01-13 MED ORDER — AZITHROMYCIN 500 MG PO TABS
500.0000 mg | ORAL_TABLET | Freq: Every day | ORAL | Status: DC
  Administered 2022-01-13: 500 mg via ORAL
  Filled 2022-01-13: qty 1

## 2022-01-13 MED ORDER — ISOSORBIDE MONONITRATE ER 60 MG PO TB24
60.0000 mg | ORAL_TABLET | Freq: Every day | ORAL | Status: DC
  Administered 2022-01-13 – 2022-01-21 (×9): 60 mg via ORAL
  Filled 2022-01-13 (×9): qty 1

## 2022-01-13 MED ORDER — SODIUM CHLORIDE 0.9 % IV SOLN: INTRAVENOUS | Status: DC

## 2022-01-13 MED ORDER — MIRTAZAPINE 15 MG PO TABS
30.0000 mg | ORAL_TABLET | Freq: Every day | ORAL | Status: DC
  Administered 2022-01-14 (×2): 30 mg via ORAL
  Filled 2022-01-13 (×2): qty 2

## 2022-01-13 MED ORDER — ONDANSETRON HCL 4 MG/2ML IJ SOLN: 4.0000 mg | Freq: Four times a day (QID) | INTRAMUSCULAR | Status: DC | PRN

## 2022-01-13 MED ORDER — AMIODARONE HCL IN DEXTROSE 360-4.14 MG/200ML-% IV SOLN
30.0000 mg/h | INTRAVENOUS | Status: DC
  Administered 2022-01-13: 30 mg/h via INTRAVENOUS
  Filled 2022-01-13 (×2): qty 200

## 2022-01-13 MED ORDER — ACETAMINOPHEN 325 MG PO TABS: 650.0000 mg | ORAL_TABLET | Freq: Four times a day (QID) | ORAL | Status: DC | PRN

## 2022-01-13 MED ORDER — ACETAMINOPHEN 650 MG RE SUPP: 650.0000 mg | Freq: Four times a day (QID) | RECTAL | Status: DC | PRN

## 2022-01-13 MED ORDER — DOXYCYCLINE HYCLATE 100 MG PO TABS
100.0000 mg | ORAL_TABLET | Freq: Two times a day (BID) | ORAL | Status: DC
  Administered 2022-01-14 (×2): 100 mg via ORAL
  Filled 2022-01-13 (×2): qty 1

## 2022-01-13 MED ORDER — LEVALBUTEROL HCL 0.63 MG/3ML IN NEBU
0.6300 mg | INHALATION_SOLUTION | Freq: Four times a day (QID) | RESPIRATORY_TRACT | Status: DC | PRN
  Administered 2022-01-13 – 2022-01-14 (×2): 0.63 mg via RESPIRATORY_TRACT
  Filled 2022-01-13 (×2): qty 3

## 2022-01-13 MED ORDER — AMIODARONE HCL IN DEXTROSE 360-4.14 MG/200ML-% IV SOLN
60.0000 mg/h | INTRAVENOUS | Status: AC
  Administered 2022-01-13: 60 mg/h via INTRAVENOUS
  Filled 2022-01-13: qty 200

## 2022-01-13 MED ORDER — IPRATROPIUM-ALBUTEROL 0.5-2.5 (3) MG/3ML IN SOLN
3.0000 mL | Freq: Once | RESPIRATORY_TRACT | Status: AC
  Administered 2022-01-13: 3 mL via RESPIRATORY_TRACT
  Filled 2022-01-13: qty 3

## 2022-01-13 MED ORDER — ATORVASTATIN CALCIUM 20 MG PO TABS
40.0000 mg | ORAL_TABLET | Freq: Every day | ORAL | Status: DC
  Administered 2022-01-13 – 2022-01-21 (×9): 40 mg via ORAL
  Filled 2022-01-13 (×9): qty 2

## 2022-01-13 MED ORDER — OCUVITE-LUTEIN PO CAPS
1.0000 | ORAL_CAPSULE | Freq: Every day | ORAL | Status: DC
  Filled 2022-01-13: qty 1

## 2022-01-13 MED ORDER — METOPROLOL SUCCINATE ER 50 MG PO TB24
50.0000 mg | ORAL_TABLET | Freq: Every day | ORAL | Status: DC
  Administered 2022-01-13 – 2022-01-21 (×9): 50 mg via ORAL
  Filled 2022-01-13 (×9): qty 1

## 2022-01-13 MED ORDER — MAGNESIUM SULFATE 4 GM/100ML IV SOLN
4.0000 g | Freq: Once | INTRAVENOUS | Status: AC
  Administered 2022-01-14: 4 g via INTRAVENOUS
  Filled 2022-01-13 (×2): qty 100

## 2022-01-13 MED ORDER — LEVALBUTEROL HCL 0.63 MG/3ML IN NEBU
0.6300 mg | INHALATION_SOLUTION | Freq: Four times a day (QID) | RESPIRATORY_TRACT | Status: DC
  Administered 2022-01-13 – 2022-01-14 (×4): 0.63 mg via RESPIRATORY_TRACT
  Filled 2022-01-13 (×4): qty 3

## 2022-01-13 MED ORDER — SODIUM CHLORIDE 0.9 % IV BOLUS
1000.0000 mL | Freq: Once | INTRAVENOUS | Status: AC
  Administered 2022-01-13: 1000 mL via INTRAVENOUS

## 2022-01-13 MED ORDER — ONDANSETRON HCL 4 MG PO TABS: 4.0000 mg | ORAL_TABLET | Freq: Four times a day (QID) | ORAL | Status: DC | PRN

## 2022-01-13 MED ORDER — IOHEXOL 350 MG/ML SOLN
75.0000 mL | Freq: Once | INTRAVENOUS | Status: AC | PRN
  Administered 2022-01-14: 75 mL via INTRAVENOUS

## 2022-01-13 NOTE — Telephone Encounter (Signed)
Called patient to discuss answer to questions patient had about medications.  No answer. HIPAA compliant vm left requesting return call.  Howe for nurse triage to give results if patient calls back.

## 2022-01-13 NOTE — ED Notes (Signed)
Initial amiodorone dose was scheduled for 1415, but not given until 1554. I re-timed the dose change for 2200, but accidentally re-timed the first dose. Please see start time for accuracy.

## 2022-01-13 NOTE — Consult Note (Signed)
Kristi Clark is a 71 y.o. female  [id number]  Primary Cardiologist: Neoma Laming, MD  Reason for Consultation: new onset atrial fibrillation  HPI: From ED HPI Kristi Clark is a 71 y.o. female brought to the ED via EMS from home with a chief complaint of shortness of breath.  Patient with a history of COPD on 2 L continuous oxygen who has had recurrent community-acquired pneumonia over the past several weeks requiring several rounds of antibiotics and prednisone.  Seen by her PCP yesterday and prescribed another course of doxycycline.  Also endorses unintentional weight loss over the past several months.  Reports shortness of breath with productive cough of yellow sputum.  Denies fever/chills, chest pain, abdominal pain, nausea, vomiting or dizziness.    Review of Systems: denies chest pain. Complains of worsening shortness of breath   Past Medical History:  Diagnosis Date   AK (actinic keratosis) 10/25/2017   left forehead   BCC (basal cell carcinoma) 10/25/2017   left upper lateral eyelid, Moh's   COPD (chronic obstructive pulmonary disease) (HCC)    Depression    GERD (gastroesophageal reflux disease)    Hyperlipidemia    Hypertension    Menopause    Osteopenia    Tobacco abuse     (Not in a hospital admission)     albuterol  2.5 mg Nebulization Once   [START ON 01/14/2022] aspirin EC  81 mg Oral Daily   atorvastatin  40 mg Oral Daily   azithromycin  500 mg Oral Daily   budesonide (PULMICORT) nebulizer solution  0.5 mg Nebulization BID   clopidogrel  75 mg Oral Daily   hydrALAZINE  12.5 mg Oral BID   ipratropium  0.5 mg Nebulization QID   isosorbide mononitrate  60 mg Oral Daily   levalbuterol  0.63 mg Nebulization QID   metoprolol succinate  50 mg Oral Daily   mirtazapine  30 mg Oral QHS   [START ON 01/14/2022] multivitamin-lutein  1 capsule Oral Daily   triamcinolone acetonide  40 mg Intramuscular Once    Infusions:  sodium chloride 50 mL/hr at  01/13/22 1052    Allergies  Allergen Reactions   Clindamycin/Lincomycin Hives   Prednisone Other (See Comments)    Psychosis   Amoxicillin Rash   Avelox [Moxifloxacin Hcl In Nacl] Rash   Codeine Sulfate Nausea Only   Penicillins Rash   Sulfa Antibiotics Rash    Social History   Socioeconomic History   Marital status: Widowed    Spouse name: Not on file   Number of children: 2   Years of education: Not on file   Highest education level: 10th grade  Occupational History   Occupation: retired  Tobacco Use   Smoking status: Every Day    Packs/day: 1.00    Years: 56.00    Total pack years: 56.00    Types: Cigarettes   Smokeless tobacco: Never  Vaping Use   Vaping Use: Never used  Substance and Sexual Activity   Alcohol use: No   Drug use: No   Sexual activity: Never  Other Topics Concern   Not on file  Social History Narrative   Lives with great grandson, Argentina Ponder   Social Determinants of Health   Financial Resource Strain: Low Risk  (05/31/2021)   Overall Financial Resource Strain (CARDIA)    Difficulty of Paying Living Expenses: Not hard at all  Food Insecurity: No Food Insecurity (05/31/2021)   Hunger Vital Sign    Worried About Running  Out of Food in the Last Year: Never true    Ran Out of Food in the Last Year: Never true  Transportation Needs: No Transportation Needs (05/31/2021)   PRAPARE - Hydrologist (Medical): No    Lack of Transportation (Non-Medical): No  Physical Activity: Inactive (05/31/2021)   Exercise Vital Sign    Days of Exercise per Week: 0 days    Minutes of Exercise per Session: 0 min  Stress: No Stress Concern Present (05/31/2021)   [place]    Feeling of Stress : Not at all  Social Connections: Socially Isolated (05/31/2021)   Social Connection and Isolation Panel [NHANES]    Frequency of Communication with Friends and Family: More than  three times a week    Frequency of Social Gatherings with Friends and Family: Twice a week    Attends Religious Services: Never    Marine scientist or Organizations: No    Attends Archivist Meetings: Never    Marital Status: Widowed  Intimate Partner Violence: Not At Risk (05/31/2021)   Humiliation, Afraid, Rape, and Kick questionnaire    Fear of Current or Ex-Partner: No    Emotionally Abused: No    Physically Abused: No    Sexually Abused: No    Family History  Problem Relation Age of Onset   Cancer Mother    Stroke Mother    Heart disease Father    Hyperlipidemia Father    Cancer Sister    Diabetes Sister    Breast cancer Sister 93   Diabetes Brother    Asthma Son    Cancer Son    Diabetes Daughter    Hypertension Daughter    Cancer Maternal Grandmother        gallbladder   Diabetes Brother    Heart disease Brother    Breast cancer Paternal Aunt     PHYSICAL EXAM: Vitals:   01/13/22 1000 01/13/22 1244  BP: (!) 151/56   Pulse: (!) 106   Resp: (!) 21   Temp:  97.9 F (36.6 C)  SpO2: 100%     No intake or output data in the 24 hours ending 01/13/22 1330  General:  ill appearing HEENT: normal Neck: supple. no JVD. Carotids 2+ bilat; no bruits. No lymphadenopathy or thryomegaly appreciated. Cor: PMI nondisplaced. Tachycardic, irregular. No rubs, gallops or murmurs. Lungs: scattered rhonchi, labored breathing Abdomen: soft, nontender, nondistended. No hepatosplenomegaly. No bruits or masses. Good bowel sounds. Extremities: no cyanosis, clubbing, rash, edema Neuro: alert & oriented x 3, cranial nerves grossly intact. moves all 4 extremities w/o difficulty. Affect pleasant.  ECG: atrial fibrillation, HR 94 bpm  Results for orders placed or performed during the hospital encounter of 01/13/22 (from the past 24 hour(s))  Urinalysis, Routine w reflex microscopic Urine, Clean Catch     Status: Abnormal   Collection Time: 01/13/22  3:52 AM  Result  Value Ref Range   Color, Urine YELLOW (A) YELLOW   APPearance CLEAR (A) CLEAR   Specific Gravity, Urine 1.011 1.005 - 1.030   pH 6.0 5.0 - 8.0   Glucose, UA NEGATIVE NEGATIVE mg/dL   Hgb urine dipstick NEGATIVE NEGATIVE   Bilirubin Urine NEGATIVE NEGATIVE   Ketones, ur 5 (A) NEGATIVE mg/dL   Protein, ur 30 (A) NEGATIVE mg/dL   Nitrite NEGATIVE NEGATIVE   Leukocytes,Ua NEGATIVE NEGATIVE   WBC, UA 0-5 0 - 5 WBC/hpf   Bacteria, UA  NONE SEEN NONE SEEN   Squamous Epithelial / LPF 0-5 0 - 5   Mucus PRESENT   CBC with Differential     Status: Abnormal   Collection Time: 01/13/22  3:53 AM  Result Value Ref Range   WBC 8.3 4.0 - 10.5 K/uL   RBC 2.80 (L) 3.87 - 5.11 MIL/uL   Hemoglobin 8.1 (L) 12.0 - 15.0 g/dL   HCT 24.6 (L) 36.0 - 46.0 %   MCV 87.9 80.0 - 100.0 fL   MCH 28.9 26.0 - 34.0 pg   MCHC 32.9 30.0 - 36.0 g/dL   RDW 17.4 (H) 11.5 - 15.5 %   Platelets 319 150 - 400 K/uL   nRBC 0.0 0.0 - 0.2 %   Neutrophils Relative % 79 %   Neutro Abs 6.5 1.7 - 7.7 K/uL   Lymphocytes Relative 13 %   Lymphs Abs 1.1 0.7 - 4.0 K/uL   Monocytes Relative 7 %   Monocytes Absolute 0.6 0.1 - 1.0 K/uL   Eosinophils Relative 0 %   Eosinophils Absolute 0.0 0.0 - 0.5 K/uL   Basophils Relative 0 %   Basophils Absolute 0.0 0.0 - 0.1 K/uL   Immature Granulocytes 1 %   Abs Immature Granulocytes 0.05 0.00 - 0.07 K/uL  Comprehensive metabolic panel     Status: Abnormal   Collection Time: 01/13/22  3:53 AM  Result Value Ref Range   Sodium 124 (L) 135 - 145 mmol/L   Potassium 3.7 3.5 - 5.1 mmol/L   Chloride 90 (L) 98 - 111 mmol/L   CO2 28 22 - 32 mmol/L   Glucose, Bld 107 (H) 70 - 99 mg/dL   BUN 15 8 - 23 mg/dL   Creatinine, Ser 0.53 0.44 - 1.00 mg/dL   Calcium 8.6 (L) 8.9 - 10.3 mg/dL   Total Protein 6.0 (L) 6.5 - 8.1 g/dL   Albumin 2.6 (L) 3.5 - 5.0 g/dL   AST 51 (H) 15 - 41 U/L   ALT 52 (H) 0 - 44 U/L   Alkaline Phosphatase 96 38 - 126 U/L   Total Bilirubin 0.6 0.3 - 1.2 mg/dL   GFR, Estimated  >60 >60 mL/min   Anion gap 6 5 - 15  Resp Panel by RT-PCR (Flu A&B, Covid) Anterior Nasal Swab     Status: None   Collection Time: 01/13/22  3:53 AM   Specimen: Anterior Nasal Swab  Result Value Ref Range   SARS Coronavirus 2 by RT PCR NEGATIVE NEGATIVE   Influenza A by PCR NEGATIVE NEGATIVE   Influenza B by PCR NEGATIVE NEGATIVE  Troponin I (High Sensitivity)     Status: None   Collection Time: 01/13/22  3:53 AM  Result Value Ref Range   Troponin I (High Sensitivity) 9 <18 ng/L  Lipase, blood     Status: Abnormal   Collection Time: 01/13/22  3:53 AM  Result Value Ref Range   Lipase 67 (H) 11 - 51 U/L  Troponin I (High Sensitivity)     Status: None   Collection Time: 01/13/22  6:30 AM  Result Value Ref Range   Troponin I (High Sensitivity) 10 <18 ng/L  Lactic acid, plasma     Status: None   Collection Time: 01/13/22  6:30 AM  Result Value Ref Range   Lactic Acid, Venous 0.7 0.5 - 1.9 mmol/L  Procalcitonin - Baseline     Status: None   Collection Time: 01/13/22  6:30 AM  Result Value Ref Range   Procalcitonin <  0.10 ng/mL  Lactic acid, plasma     Status: None   Collection Time: 01/13/22  8:35 AM  Result Value Ref Range   Lactic Acid, Venous 0.9 0.5 - 1.9 mmol/L   DG Chest Portable 1 View  Result Date: 01/13/2022 CLINICAL DATA:  71 year old female with shortness of breath. Smoker. Testing for COVID-19. Pending. EXAM: PORTABLE CHEST 1 VIEW COMPARISON:  Chest radiographs 01/04/2022 and earlier, including CTA 09/11/2021. FINDINGS: Portable AP upright view at 0341 hours. Severe chronic lung disease with widespread coarse pulmonary interstitial opacity. Lower lung volumes compared to last month. Also, increased confluence of opacity at the left lung base with suspicion now of small left versus bilateral pleural effusions. No pneumothorax. Stable cardiac size and mediastinal contours. No pulmonary edema suspected. No acute osseous abnormality identified. Paucity of bowel gas. IMPRESSION:  Severe chronic lung disease with suspected new small pleural effusions and increased confluence of opacity at the left lung base such as due to acute infectious exacerbation. Electronically Signed   By: Genevie Ann M.D.   On: 01/13/2022 03:52     ASSESSMENT AND PLAN: Patient fou d with new onset atrial fibrillation. Will start amiodarone drip. Will hold off on Eliquis until anemia resolved. Echo to check heart function. Will continue to follow.  Engineer, drilling FNP-C

## 2022-01-13 NOTE — Telephone Encounter (Signed)
Called [facility] for Collie Siad to follow up on the review of medications for patient. No answer, unable to leave VM, VM is full.

## 2022-01-13 NOTE — Assessment & Plan Note (Signed)
Chronic, ongoing, likely exacerbated by current CAP She has been given script for Doxycycline from PCP following most recent CXR from 01/04/22 - started taking this yesterday Reports productive cough and choking sensation along with increased mucus and SOB Reviewed using nebulizers, inhalers, and O2 supplementation to manage breathing Reviewed ED and return precautions with patient and her daughter Recommend she finish entire course of abx and has follow up with PCP to re-assess on 01/18/22

## 2022-01-13 NOTE — Telephone Encounter (Signed)
Returned call to Collie Siad, advised on medications no further questions.

## 2022-01-13 NOTE — ED Notes (Signed)
Informed RN bed assigned 

## 2022-01-13 NOTE — Telephone Encounter (Signed)
Response appears appropriate. She can take Mucinex during the day and Delsym at night for coughing as desired.

## 2022-01-13 NOTE — H&P (Addendum)
History and Physical    Patient: Kristi Clark YQM:[id number] DOB: Nov 16, 1950 DOA: 01/13/2022 DOS: the patient was seen and examined on 01/13/2022 PCP: [name], DO  Patient coming from: Home  Chief Complaint:  Chief Complaint  Patient presents with   Shortness of Breath   HPI: Kristi Clark is a 71 y.o. female with medical history significant of keratosis, basal cell carcinoma, COPD, depression, GERD, hyperlipidemia, hypertension, disease, moderate CAD treated medically who is coming to the emergency department with complaints of progressively worse acute on chronic dyspnea for the past 2 weeks associated with wheezing, occasional productive cough with yellowish sputum, pleuritic chest wall pain, fatigue and decreased appetite.  She has also continued to lose weight.  No fever, chills or night sweats. No sore throat, rhinorrhea, dyspnea, wheezing or hemoptysis.  No chest pain, palpitations, diaphoresis, PND, orthopnea, but occasionally gets pitting edema of the lower extremities.  No diarrhea, occasional constipation constipation, no melena or hematochezia.  No flank pain, dysuria, frequency or hematuria.  Has stress incontinence.  No polyuria, polydipsia, polyphagia or blurred vision.  ED course: Initial vital signs were temperature 98.1 degrees, pulse, respiration 20, BP 127 mm of nasal cannula oxygen patient received doxycycline 100 mg IVPB, 201,000 mL of normal saline bolus.  Lab work: Her urinalysis showed ketonuria 5 and proteinuria 30 mg/dL.  Troponin x2 and lactic acid x2 negative.  Normal procalcitonin.  Negative coronavirus and influenza PCR.  CBC with a white count 8.3, hemoglobin 9.1 g/dL platelets 319.  Her most recent hemoglobin level was 9.1 g/dL on 09/21/2021.  CMP showed a sodium 124, potassium 3.7, chloride 90 and CO2 28 mmol/L.  Renal function was normal.  Calcium is normal after correction.  Total protein 6.0, albumin 2.6 g/dL.  AST 51 and ALT 52 units/L.  Normal  alk phos and bilirubin level.  Lipase was 67.  Imaging: Severe chronic lung disease with suspected new small pleural effusions and increased confluence of opacity of the left lung base questionable for acute infectious exacerbation.   Review of Systems: As mentioned in the history of present illness. All other systems reviewed and are negative. Past Medical History:  Diagnosis Date   AK (actinic keratosis) 10/25/2017   left forehead   BCC (basal cell carcinoma) 10/25/2017   left upper lateral eyelid, Moh's   COPD (chronic obstructive pulmonary disease) (HCC)    Depression    GERD (gastroesophageal reflux disease)    Hyperlipidemia    Hypertension    Menopause    Osteopenia    Tobacco abuse    Past Surgical History:  Procedure Laterality Date   CARDIAC CATHETERIZATION     CHOLECYSTECTOMY  2014   COLONOSCOPY WITH PROPOFOL N/A 01/16/2018   Procedure: COLONOSCOPY WITH PROPOFOL;  Surgeon: Lollie Sails, MD;  Location: [facility] ENDOSCOPY;  Service: Endoscopy;  Laterality: N/A;   ESOPHAGOGASTRODUODENOSCOPY (EGD) WITH PROPOFOL N/A 01/16/2018   Procedure: ESOPHAGOGASTRODUODENOSCOPY (EGD) WITH PROPOFOL;  Surgeon: Lollie Sails, MD;  Location: [facility] ENDOSCOPY;  Service: Endoscopy;  Laterality: N/A;   heart stent     LEFT HEART CATH AND CORONARY ANGIOGRAPHY N/A 09/17/2021   Procedure: LEFT HEART CATH AND CORONARY ANGIOGRAPHY;  Surgeon: Dionisio , MD;  Location: [facility];  Service: Cardiovascular;  Laterality: N/A;   TEE WITHOUT CARDIOVERSION N/A 05/11/2021   Procedure: TRANSESOPHAGEAL ECHOCARDIOGRAM (TEE);  Surgeon: Corey Skains, MD;  Location: [facility] ORS;  Service: Cardiovascular;  Laterality: N/A;   TOTAL ABDOMINAL HYSTERECTOMY  Social History:  reports that she has been smoking cigarettes. She has a 56.00 pack-year smoking history. She has never used smokeless tobacco. She reports that she does not drink alcohol and does not use drugs.  Allergies  Allergen  Reactions   Clindamycin/Lincomycin Hives   Prednisone Other (See Comments)    Psychosis   Amoxicillin Rash   Avelox [Moxifloxacin Hcl In Nacl] Rash   Codeine Sulfate Nausea Only   Penicillins Rash   Sulfa Antibiotics Rash    Family History  Problem Relation Age of Onset   Cancer Mother    Stroke Mother    Heart disease Father    Hyperlipidemia Father    Cancer Sister    Diabetes Sister    Breast cancer Sister 38   Diabetes Brother    Asthma Son    Cancer Son    Diabetes Daughter    Hypertension Daughter    Cancer Maternal Grandmother        gallbladder   Diabetes Brother    Heart disease Brother    Breast cancer Paternal Aunt     Prior to Admission medications   Medication Sig Start Date End Date Taking? Authorizing Provider  albuterol (PROVENTIL) (2.5 MG/3ML) 0.083% nebulizer solution Take 3 mLs (2.5 mg total) by nebulization every 6 (six) hours as needed for wheezing or shortness of breath. 11/02/21   Johnson, Megan P, DO  albuterol (VENTOLIN HFA) 108 (90 Base) MCG/ACT inhaler Inhale 2 puffs into the lungs every 6 (six) hours as needed for wheezing or shortness of breath. 08/16/21   Park Liter P, DO  aspirin 81 MG tablet Take 81 mg by mouth daily.    [provider]  atorvastatin (LIPITOR) 40 MG tablet Take 1 tablet (40 mg total) by mouth daily. 08/16/21   Park Liter P, DO  Blood Pressure Monitoring (BLOOD PRESSURE MONITOR/WRIST) KIT Take Blood pressure as needed, Dx: I12.9 12/09/20   Park Liter P, DO  Cetirizine HCl (ZYRTEC PO) Take by mouth daily.    [provider]  clopidogrel (PLAVIX) 75 MG tablet Take 1 tablet (75 mg total) by mouth daily. 01/11/22   Johnson, Megan P, DO  doxycycline (VIBRA-TABS) 100 MG tablet Take 1 tablet (100 mg total) by mouth 2 (two) times daily. 01/09/22   Johnson, Megan P, DO  fluticasone (FLONASE) 50 MCG/ACT nasal spray Place 2 sprays into both nostrils daily. 07/31/20   Johnson, Megan P, DO  fluticasone-salmeterol  (ADVAIR DISKUS) 250-50 MCG/ACT AEPB Inhale 1 puff into the lungs in the morning and at bedtime. 08/16/21   Park Liter P, DO  hydrALAZINE (APRESOLINE) 25 MG tablet Take 0.5 tablets (12.5 mg total) by mouth 2 (two) times daily. 10/05/21   Johnson, Megan P, DO  hydrochlorothiazide (MICROZIDE) 12.5 MG capsule Take 1 capsule (12.5 mg total) by mouth daily as needed. 12/27/21   Johnson, Megan P, DO  isosorbide mononitrate (IMDUR) 60 MG 24 hr tablet Take 1 tablet (60 mg total) by mouth 2 (two) times daily. 09/10/21   Johnson, Megan P, DO  losartan (COZAAR) 25 MG tablet Take 1 tablet (25 mg total) by mouth daily. 10/12/21   Johnson, Megan P, DO  metoprolol succinate (TOPROL-XL) 50 MG 24 hr tablet Take 1 tablet (50 mg total) by mouth daily. Take with or immediately following a meal. 01/11/22   Johnson, Megan P, DO  mirtazapine (REMERON) 45 MG tablet Take 1 tablet (45 mg total) by mouth at bedtime. 12/21/21   Park Liter P, DO  Multiple Vitamins-Minerals (CENTRUM SILVER ADULT 50+ PO) Take 1 tablet by mouth daily.    [provider]  Multiple Vitamins-Minerals (PRESERVISION AREDS 2+MULTI VIT) CAPS Take 1 capsule by mouth at bedtime.    [provider]  nystatin (MYCOSTATIN) 100000 UNIT/ML suspension Take 5 mLs (500,000 Units total) by mouth 4 (four) times daily. 12/10/21   Johnson, Megan P, DO  ondansetron (ZOFRAN) 4 MG tablet Take 1 tablet (4 mg total) by mouth every 8 (eight) hours as needed for nausea or vomiting. 09/10/21   Johnson, Megan P, DO  polyethylene glycol powder (GLYCOLAX/MIRALAX) 17 GM/SCOOP powder Take 17 g by mouth daily as needed for moderate constipation. 12/09/20   Johnson, Megan P, DO  Probiotic Product (PROBIOTIC-10 PO) Take by mouth. Every other day    [provider]  saline (AYR) GEL Place 1 Application into both nostrils every 4 (four) hours as needed. 12/10/21   Johnson, Megan P, DO  tiotropium (SPIRIVA) 18 MCG inhalation capsule Place 1 capsule (18 mcg total) into  inhaler and inhale daily. 03/29/21   Jon Billings, NP    Physical Exam: Vitals:   01/13/22 0346 01/13/22 0347  BP: (!) 147/55   Pulse: (!) 105   Resp: 20   Temp: 98.1 F (36.7 C)   TempSrc: Oral   SpO2: 97%   Weight:  41.3 kg  Height:  _0  (1.448 m)   Physical Exam Vitals and nursing note reviewed.  Constitutional:      General: She is awake. She is not in acute distress.    Appearance: She is normal weight. She is ill-appearing.     Comments: Chronically ill-appearing.  HENT:     Head: Normocephalic.     Nose: No rhinorrhea.     Mouth/Throat:     Mouth: Mucous membranes are moist.  Eyes:     General: No scleral icterus.    Pupils: Pupils are equal, round, and reactive to light.  Neck:     Vascular: No JVD.  Cardiovascular:     Rate and Rhythm: Regular rhythm. Tachycardia present.     Heart sounds: S1 normal and S2 normal.  Pulmonary:     Effort: Tachypnea present. No accessory muscle usage.     Breath sounds: Wheezing and rhonchi present. No rales.  Abdominal:     General: Bowel sounds are normal. There is no distension.     Palpations: Abdomen is soft.     Tenderness: There is no abdominal tenderness.  Musculoskeletal:     Cervical back: Neck supple.     Right lower leg: No edema.     Left lower leg: No edema.  Skin:    General: Skin is warm and dry.  Neurological:     General: No focal deficit present.     Mental Status: She is alert and oriented to person, place, and time.  Psychiatric:        Mood and Affect: Mood normal.        Behavior: Behavior is cooperative.   Data Reviewed:  There are no new results to review at this time.  EKG today Vent. rate 94 BPM PR interval * ms QRS duration 88 ms QT/QTcB 336/420 ms P-R-T axes * 87 1 Atrial fibrillation Abnormal ECG When compared with ECG of 11-Sep-2021 19:20, With sinus rhythm on previous tracing.  09/17/2021 LEFT HEART CATH AND CORONARY ANGIOGRAPHY   Conclusion    Mid Cx lesion is 55%  stenosed.   Dist LAD lesion is 55%  stenosed.   The left ventricular systolic function is normal.   LV end diastolic pressure is mildly elevated.   Medical treatment for moderate lesion in mid LCX and distal LAD with stent in mid RCA no disease, treat medically   TEE 05/17/2021  IMPRESSIONS:   1. Left ventricular ejection fraction, by estimation, is 55 to 60%. The  left ventricle has normal function. The left ventricle has no regional  wall motion abnormalities.   2. Right ventricular systolic function is normal. The right ventricular  size is normal.   3. No left atrial/left atrial appendage thrombus was detected.   4. Large but normal Eustachiam valve shich is what was seen by TTE.   5. The mitral valve is normal in structure. Trivial mitral valve  regurgitation.   6. The aortic valve is normal in structure. Aortic valve regurgitation is  trivial.   7. There is Moderate (Grade III) plaque.   Echocardiogram 04/29/2021. IMPRESSIONS:    1. Left ventricular ejection fraction, by estimation, is 55 to 60%. The  left ventricle has normal function. The left ventricle has no regional  wall motion abnormalities.   2. Right ventricular systolic function is normal. The right ventricular  size is normal.   3. No left atrial/left atrial appendage thrombus was detected.   4. Large but normal Eustachiam valve shich is what was seen by TTE.   5. The mitral valve is normal in structure. Trivial mitral valve  regurgitation.   6. The aortic valve is normal in structure. Aortic valve regurgitation is  trivial.   7. There is Moderate (Grade III) plaque.   Assessment and Plan: Principal Problem:   Acute on chronic respiratory failure with hypoxia (HCC) In the setting of:   COPD exacerbation (HCC) Observation/telemetry Continue supplemental oxygen. Sensitive to prednisone. Scheduled and as needed bronchodilators. Sensitive to prednisone. Budesonide 0.5 mg neb twice daily. Continue  doxycycline 100 mg p.o. twice daily. Follow-up CBC and chemistry in the morning.   Active Problems:   Atrial fibrillation with RVR (HCC) CHA2DS2-VASc Score of at least 4. Continue beta-blocker daily. Switch albuterol to levalbuterol. Keep electrolytes optimized. Cardiology consulted.    CAD (coronary artery disease) Continue statin. Continue clopidogrel 75 mg p.o. daily. Continue daily metoprolol succinate.    Hyponatremia Hold high-dose chlorthalidone. Fluid restriction. NS at 50 mL/h x 20 hours.    GERD (gastroesophageal reflux disease) Begin PPI.    Hyperlipidemia Continue atorvastatin 40 mg p.o. daily.    Benign hypertension with chronic kidney disease Continue metoprolol succinate 50 mg p.o. daily. Continue hydralazine 12.5 mg p.o. twice daily. Monitor blood pressure and heart rate.    Aortic atherosclerosis (HCC) On atorvastatin and clopidogrel.    Unintentional weight loss   Malnutrition of moderate degree Stated being worked up by PCP. Likely due to advanced emphysema. Continue Remeron at bedtime. Protein supplementation. Follow-up with PCP as an outpatient.    Normocytic anemia Monitor hematocrit and hemoglobin.   Transfuse as needed.    Transaminitis Minimal/unknown significance. On Atorvastatin 40. Follow-up LFTs in AM.     Advance Care Planning:   Code Status: Full Code   Consults: Cardiology Neoma Laming, MD and [employer], [place]).  Family Communication:   Severity of Illness: The appropriate patient status for this patient is INPATIENT. Inpatient status is judged to be reasonable and necessary in order to provide the required intensity of service to ensure the patient's safety. The patient's presenting symptoms, physical exam findings, and initial radiographic and laboratory data  in the context of their chronic comorbidities is felt to place them at high risk for further clinical deterioration. Furthermore, it is not anticipated that the  patient will be medically stable for discharge from the hospital within 2 midnights of admission.   * I certify that at the point of admission it is my clinical judgment that the patient will require inpatient hospital care spanning beyond 2 midnights from the point of admission due to high intensity of service, high risk for further deterioration and high frequency of surveillance required.*  Author: Reubin Milan, MD 01/13/2022 7:23 AM  For on call review [email or web address].   This document was prepared using Dragon voice recognition software and may contain some unintended transcription errors.

## 2022-01-13 NOTE — ED Triage Notes (Signed)
Pt to triage via w/c with no distress noted, brought in by EMS; pt reports SHOB this morning with prod cough yellow sputum, lower abd pain; +smoker, chronic O2 at 2l/min via [place]; currently taking doxycyline for "beginnings of pneumonia"

## 2022-01-13 NOTE — Progress Notes (Signed)
       CROSS COVER NOTE  NAME: Kristi Clark MRN: [id number] DOB : [birth year]    Time of Service   2147 pm  HPI/Events of Note   Patient admitted at advice of PCP for CAP of which she has had several rounds of antibiotics.  Also found to have new onset atrial fib.  Pro cal noted not elevated < 0.10, normal white count. Low sodium and mild transaminits likely due to poor oral intake, and hydrochorthalidone as she has had reported weight loss in the setting of severe emphysema  Assessment and  Interventions   Assessment: Moderate probability for PE based on Wells score.  Other risk factors not assessed with evaluation tools is COPD, ongoing worsening despite appropriate treatment and new onset atrial fib Plan: Ddimer elevated CTA chest negative for PE - other findings - reviewed in epic - follow up findings with echo,  Mag - 1.6 - rsupplemented with 4 gms IV repeat in am Phos (weakness) tsh, free t4 in am (tachyarrhythmia + amio therapy) Speech therapy eval for swallow study Echo already ordered      Kathlene Cote NP

## 2022-01-13 NOTE — Telephone Encounter (Signed)
OK for verbal orders?

## 2022-01-13 NOTE — Telephone Encounter (Signed)
Returned call to Collie Siad, advised of verbal orders. No further questions.

## 2022-01-13 NOTE — ED Provider Notes (Signed)
[facility] Provider Note    Event Date/Time   First MD Initiated Contact with Patient 01/13/22 [phone]     (approximate)   History   Shortness of Breath   HPI  Kristi Clark is a 71 y.o. female brought to the ED via EMS from home with a chief complaint of shortness of breath.  Patient with a history of COPD on 2 L continuous oxygen who has had recurrent community-acquired pneumonia over the past several weeks requiring several rounds of antibiotics and prednisone.  Seen by her PCP yesterday and prescribed another course of doxycycline.  Also endorses unintentional weight loss over the past several months.  Reports shortness of breath with productive cough of yellow sputum.  Denies fever/chills, chest pain, abdominal pain, nausea, vomiting or dizziness.     Past Medical History   Past Medical History:  Diagnosis Date   AK (actinic keratosis) 10/25/2017   left forehead   BCC (basal cell carcinoma) 10/25/2017   left upper lateral eyelid, Moh's   COPD (chronic obstructive pulmonary disease) (HCC)    Depression    GERD (gastroesophageal reflux disease)    Hyperlipidemia    Hypertension    Menopause    Osteopenia    Tobacco abuse      Active Problem List   Patient Active Problem List   Diagnosis Date Noted   Pneumonia of left lower lobe due to infectious organism 11/23/2021   COVID-19 11/15/2021   Chronic respiratory failure with hypoxia ([place]) 09/21/2021   Valvular vegetation 05/06/2021   Malnutrition of moderate degree 04/29/2021   Unintentional weight loss 04/28/2021   Congestive heart failure ([place]) 03/15/2021   Cramps, extremity 05/29/2020   Aortic atherosclerosis ([place]) 05/27/2020   Hyponatremia 02/17/2020   Hypokalemia 02/17/2020   Nicotine dependence 02/17/2020   Postherpetic neuralgia 11/24/2019   Nicotine dependence, cigarettes, w unsp disorders 10/17/2017   IBS (irritable bowel syndrome) 04/04/2017   Advanced care  planning/counseling discussion 10/03/2016   CAD (coronary artery disease) 10/03/2016   History of anemia 10/03/2016   Benign hypertension with chronic kidney disease 03/06/2015   Allergic rhinitis 09/03/2014   CKD (chronic kidney disease), 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 ENDOSCOPY;  Service: Endoscopy;  Laterality: N/A;   ESOPHAGOGASTRODUODENOSCOPY (EGD) WITH PROPOFOL N/A 01/16/2018   Procedure: ESOPHAGOGASTRODUODENOSCOPY (EGD) WITH PROPOFOL;  Surgeon: Lollie Sails, MD;  Location: [facility] ENDOSCOPY;  Service: Endoscopy;  Laterality: N/A;   heart stent     LEFT HEART CATH AND CORONARY ANGIOGRAPHY N/A 09/17/2021   Procedure: LEFT HEART CATH AND CORONARY ANGIOGRAPHY;  Surgeon: Dionisio David, MD;  Location: [facility];  Service: Cardiovascular;  Laterality: N/A;   TEE WITHOUT CARDIOVERSION N/A 05/11/2021   Procedure: TRANSESOPHAGEAL ECHOCARDIOGRAM (TEE);  Surgeon: Corey Skains, MD;  Location: [facility] ORS;  Service: Cardiovascular;  Laterality: N/A;   TOTAL ABDOMINAL HYSTERECTOMY       Home Medications   Prior to Admission medications   Medication Sig Start Date End Date Taking? Authorizing Provider  albuterol (PROVENTIL) (2.5 MG/3ML) 0.083% nebulizer solution Take  3 mLs (2.5 mg total) by nebulization every 6 (six) hours as needed for wheezing or shortness of breath. 11/02/21   Johnson, Megan P, DO  albuterol (VENTOLIN HFA) 108 (90 Base) MCG/ACT inhaler Inhale 2 puffs into the lungs every 6 (six) hours as needed for wheezing or shortness of breath. 08/16/21    Park Liter P, DO  aspirin 81 MG tablet Take 81 mg by mouth daily.    [provider]  atorvastatin (LIPITOR) 40 MG tablet Take 1 tablet (40 mg total) by mouth daily. 08/16/21   Park Liter P, DO  Blood Pressure Monitoring (BLOOD PRESSURE MONITOR/WRIST) KIT Take Blood pressure as needed, Dx: I12.9 12/09/20   Park Liter P, DO  Cetirizine HCl (ZYRTEC PO) Take by mouth daily.    [provider]  clopidogrel (PLAVIX) 75 MG tablet Take 1 tablet (75 mg total) by mouth daily. 01/11/22   Johnson, Megan P, DO  doxycycline (VIBRA-TABS) 100 MG tablet Take 1 tablet (100 mg total) by mouth 2 (two) times daily. 01/09/22   Johnson, Megan P, DO  fluticasone (FLONASE) 50 MCG/ACT nasal spray Place 2 sprays into both nostrils daily. 07/31/20   Johnson, Megan P, DO  fluticasone-salmeterol (ADVAIR DISKUS) 250-50 MCG/ACT AEPB Inhale 1 puff into the lungs in the morning and at bedtime. 08/16/21   Park Liter P, DO  hydrALAZINE (APRESOLINE) 25 MG tablet Take 0.5 tablets (12.5 mg total) by mouth 2 (two) times daily. 10/05/21   Johnson, Megan P, DO  hydrochlorothiazide (MICROZIDE) 12.5 MG capsule Take 1 capsule (12.5 mg total) by mouth daily as needed. 12/27/21   Johnson, Megan P, DO  isosorbide mononitrate (IMDUR) 60 MG 24 hr tablet Take 1 tablet (60 mg total) by mouth 2 (two) times daily. 09/10/21   Johnson, Megan P, DO  losartan (COZAAR) 25 MG tablet Take 1 tablet (25 mg total) by mouth daily. 10/12/21   Johnson, Megan P, DO  metoprolol succinate (TOPROL-XL) 50 MG 24 hr tablet Take 1 tablet (50 mg total) by mouth daily. Take with or immediately following a meal. 01/11/22   Johnson, Megan P, DO  mirtazapine (REMERON) 45 MG tablet Take 1 tablet (45 mg total) by mouth at bedtime. 12/21/21   Johnson, Megan P, DO  Multiple Vitamins-Minerals (CENTRUM SILVER ADULT 50+ PO) Take 1 tablet by mouth daily.    [provider]  Multiple Vitamins-Minerals (PRESERVISION AREDS 2+MULTI VIT) CAPS Take 1 capsule by  mouth at bedtime.    [provider]  nystatin (MYCOSTATIN) 100000 UNIT/ML suspension Take 5 mLs (500,000 Units total) by mouth 4 (four) times daily. 12/10/21   Johnson, Megan P, DO  ondansetron (ZOFRAN) 4 MG tablet Take 1 tablet (4 mg total) by mouth every 8 (eight) hours as needed for nausea or vomiting. 09/10/21   Johnson, Megan P, DO  polyethylene glycol powder (GLYCOLAX/MIRALAX) 17 GM/SCOOP powder Take 17 g by mouth daily as needed for moderate constipation. 12/09/20   Johnson, Megan P, DO  Probiotic Product (PROBIOTIC-10 PO) Take by mouth. Every other day    [provider]  saline (AYR) GEL Place 1 Application into both nostrils every 4 (four) hours as needed. 12/10/21   Johnson, Megan P, DO  tiotropium (SPIRIVA) 18 MCG inhalation capsule Place 1 capsule (18 mcg total) into inhaler and inhale daily. 03/29/21   Jon Billings, NP     Allergies  Clindamycin/lincomycin, Prednisone, Amoxicillin, Avelox [moxifloxacin hcl in nacl], Codeine sulfate, Penicillins, and Sulfa antibiotics   Family History   Family  History  Problem Relation Age of Onset   Cancer Mother    Stroke Mother    Heart disease Father    Hyperlipidemia Father    Cancer Sister    Diabetes Sister    Breast cancer Sister 12   Diabetes Brother    Asthma Son    Cancer Son    Diabetes Daughter    Hypertension Daughter    Cancer Maternal Grandmother        gallbladder   Diabetes Brother    Heart disease Brother    Breast cancer Paternal Aunt      Physical Exam  Triage Vital Signs: ED Triage Vitals  Enc Vitals Group     BP 01/13/22 0346 (!) 147/55     Pulse Rate 01/13/22 0346 (!) 105     Resp 01/13/22 0346 20     Temp 01/13/22 0346 98.1 F (36.7 C)     Temp Source 01/13/22 0346 Oral     SpO2 01/13/22 0346 97 %     Weight 01/13/22 0347 91 lb (41.3 kg)     Height 01/13/22 0347 _0  (1.448 m)     Head Circumference --      Peak Flow --      Pain Score 01/13/22 0347 5     Pain Loc --       Pain Edu? --      Excl. in [place]? --     Updated Vital Signs: BP (!) 147/55 (BP Location: Left Arm)   Pulse (!) 105   Temp 98.1 F (36.7 C) (Oral)   Resp 20   Ht _1  (1.448 m)   Wt 41.3 kg   LMP  (LMP Unknown)   SpO2 97%   BMI 19.69 kg/m    General: Awake, mild distress.  CV:  RRR.  Good peripheral perfusion.  Resp:  Normal effort.  Scattered rhonchi. Abd:  Nontender.  No distention.  Other:  Bilateral calves are nontender and not swollen.   ED Results / Procedures / Treatments  Labs (all labs ordered are listed, but only abnormal results are displayed) Labs Reviewed  CBC WITH DIFFERENTIAL/PLATELET - Abnormal; Notable for the following components:      Result Value   RBC 2.80 (*)    Hemoglobin 8.1 (*)    HCT 24.6 (*)    RDW 17.4 (*)    All other components within normal limits  COMPREHENSIVE METABOLIC PANEL - Abnormal; Notable for the following components:   Sodium 124 (*)    Chloride 90 (*)    Glucose, Bld 107 (*)    Calcium 8.6 (*)    Total Protein 6.0 (*)    Albumin 2.6 (*)    AST 51 (*)    ALT 52 (*)    All other components within normal limits  LIPASE, BLOOD - Abnormal; Notable for the following components:   Lipase 67 (*)    All other components within normal limits  RESP PANEL BY RT-PCR (FLU A&B, COVID) ARPGX2  CULTURE, BLOOD (ROUTINE X 2)  CULTURE, BLOOD (ROUTINE X 2)  URINALYSIS, ROUTINE W REFLEX MICROSCOPIC  LACTIC ACID, PLASMA  LACTIC ACID, PLASMA  PROCALCITONIN  TROPONIN I (HIGH SENSITIVITY)  TROPONIN I (HIGH SENSITIVITY)     EKG  ED ECG REPORT I, Winslow Ederer J, the attending physician, personally viewed and interpreted this ECG.   Date: 01/13/2022  EKG Time: 0354  Rate: 94  Rhythm: atrial fibrillation, rate 94  Axis: Normal  Intervals:none  ST&T  Change: Nonspecific    RADIOLOGY I have independently visualized and interpreted patient's chest x-ray as well as noted the radiology interpretation:  Chest x-ray: Severe chronic lung  disease with new left lower opacity  Official radiology report(s): DG Chest Portable 1 View  Result Date: 01/13/2022 CLINICAL DATA:  71 year old female with shortness of breath. Smoker. Testing for COVID-19. Pending. EXAM: PORTABLE CHEST 1 VIEW COMPARISON:  Chest radiographs 01/04/2022 and earlier, including CTA 09/11/2021. FINDINGS: Portable AP upright view at 0341 hours. Severe chronic lung disease with widespread coarse pulmonary interstitial opacity. Lower lung volumes compared to last month. Also, increased confluence of opacity at the left lung base with suspicion now of small left versus bilateral pleural effusions. No pneumothorax. Stable cardiac size and mediastinal contours. No pulmonary edema suspected. No acute osseous abnormality identified. Paucity of bowel gas. IMPRESSION: Severe chronic lung disease with suspected new small pleural effusions and increased confluence of opacity at the left lung base such as due to acute infectious exacerbation. Electronically Signed   By: Genevie Ann M.D.   On: 01/13/2022 03:52     PROCEDURES:  Critical Care performed: Yes, see critical care procedure note(s)  CRITICAL CARE Performed by: Paulette Blanch   Total critical care time: 30 minutes  Critical care time was exclusive of separately billable procedures and treating other patients.  Critical care was necessary to treat or prevent imminent or life-threatening deterioration.  Critical care was time spent personally by me on the following activities: development of treatment plan with patient and/or surrogate as well as nursing, discussions with consultants, evaluation of patient's response to treatment, examination of patient, obtaining history from patient or surrogate, ordering and performing treatments and interventions, ordering and review of laboratory studies, ordering and review of radiographic studies, pulse oximetry and re-evaluation of patient's condition.   Marland Kitchen1-3 Lead EKG  Interpretation  Performed by: Paulette Blanch, MD Authorized by: Paulette Blanch, MD     Interpretation: normal     ECG rate:  95   ECG rate assessment: normal     Rhythm: sinus rhythm     Ectopy: none     Conduction: normal   Comments:     Patient placed on cardiac monitor to evaluate for arrhythmias    MEDICATIONS ORDERED IN ED: Medications  doxycycline (VIBRAMYCIN) 100 mg in sodium chloride 0.9 % 250 mL IVPB (has no administration in time range)  ipratropium-albuterol (DUONEB) 0.5-2.5 (3) MG/3ML nebulizer solution 3 mL (has no administration in time range)     IMPRESSION / MDM / ASSESSMENT AND PLAN / ED COURSE  I reviewed the triage vital signs and the nursing notes.                             71 year old female presenting with shortness of breath. Differential includes, but is not limited to, viral syndrome, bronchitis including COPD exacerbation, pneumonia, reactive airway disease including asthma, CHF including exacerbation with or without pulmonary/interstitial edema, pneumothorax, ACS, thoracic trauma, and pulmonary embolism.  I personally reviewed patient's records and note a PCP office visit from yesterday.  Patient's presentation is most consistent with acute presentation with potential threat to life or bodily function.  The patient is on the cardiac monitor to evaluate for evidence of arrhythmia and/or significant heart rate changes.  Laboratory results demonstrate anemia with hemoglobin 8.1/hematocrit 24.6.  H/H has been downtrending for the past 5 months.  Sodium is 124 which is new compared to  prior.  Respiratory panel negative.  Troponin unremarkable.  Chest x-ray concerning for infiltrate.  Will add blood cultures, lactic acid, procalcitonin.  Administer IV doxycycline, duo nebs.  Hold IV Solu-Medrol as patient is allergic to prednisone.  Initiate IV fluid hydration.  Will consult hospital services for evaluation and admission.      FINAL CLINICAL IMPRESSION(S) / ED  DIAGNOSES   Final diagnoses:  SOB (shortness of breath)  Chronic obstructive pulmonary disease, unspecified COPD type ([place])  Community acquired pneumonia, unspecified laterality  Hyponatremia  Symptomatic anemia     Rx / DC Orders   ED Discharge Orders     None        Note:  This document was prepared using Dragon voice recognition software and may include unintentional dictation errors.   Paulette Blanch, MD 01/13/22 [phone]

## 2022-01-14 ENCOUNTER — Inpatient Hospital Stay: Payer: Medicare Other

## 2022-01-14 ENCOUNTER — Inpatient Hospital Stay: Admit: 2022-01-14 | Discharge: 2022-01-14 | Disposition: A | Discharge status: Home / Self Care | Payer: Medicare Other

## 2022-01-14 DIAGNOSIS — J441 Chronic obstructive pulmonary disease with (acute) exacerbation: Secondary | ICD-10-CM | POA: Diagnosis not present

## 2022-01-14 LAB — CBC WITH DIFFERENTIAL/PLATELET
Abs Immature Granulocytes: 0.03 10*3/uL (ref 0.00–0.07)
Basophils Absolute: 0 10*3/uL (ref 0.0–0.1)
Basophils Relative: 0 %
Eosinophils Absolute: 0 10*3/uL (ref 0.0–0.5)
Eosinophils Relative: 0 %
HCT: 24.9 % — ABNORMAL LOW (ref 36.0–46.0)
Hemoglobin: 8.2 g/dL — ABNORMAL LOW (ref 12.0–15.0)
Immature Granulocytes: 0 %
Lymphocytes Relative: 14 %
Lymphs Abs: 1.5 10*3/uL (ref 0.7–4.0)
MCH: 28.8 pg (ref 26.0–34.0)
MCHC: 32.9 g/dL (ref 30.0–36.0)
MCV: 87.4 fL (ref 80.0–100.0)
Monocytes Absolute: 0.7 10*3/uL (ref 0.1–1.0)
Monocytes Relative: 7 %
Neutro Abs: 8.4 10*3/uL — ABNORMAL HIGH (ref 1.7–7.7)
Neutrophils Relative %: 79 %
Platelets: 349 10*3/uL (ref 150–400)
RBC: 2.85 MIL/uL — ABNORMAL LOW (ref 3.87–5.11)
RDW: 17.4 % — ABNORMAL HIGH (ref 11.5–15.5)
WBC: 10.8 10*3/uL — ABNORMAL HIGH (ref 4.0–10.5)
nRBC: 0 % (ref 0.0–0.2)

## 2022-01-14 LAB — CBC
HCT: 25.1 % — ABNORMAL LOW (ref 36.0–46.0)
Hemoglobin: 8.3 g/dL — ABNORMAL LOW (ref 12.0–15.0)
MCH: 28.7 pg (ref 26.0–34.0)
MCHC: 33.1 g/dL (ref 30.0–36.0)
MCV: 86.9 fL (ref 80.0–100.0)
Platelets: 346 10*3/uL (ref 150–400)
RBC: 2.89 MIL/uL — ABNORMAL LOW (ref 3.87–5.11)
RDW: 17.2 % — ABNORMAL HIGH (ref 11.5–15.5)
WBC: 10.5 10*3/uL (ref 4.0–10.5)
nRBC: 0 % (ref 0.0–0.2)

## 2022-01-14 LAB — COMPREHENSIVE METABOLIC PANEL
ALT: 52 U/L — ABNORMAL HIGH (ref 0–44)
AST: 44 U/L — ABNORMAL HIGH (ref 15–41)
Albumin: 2.7 g/dL — ABNORMAL LOW (ref 3.5–5.0)
Alkaline Phosphatase: 97 U/L (ref 38–126)
Anion gap: 6 (ref 5–15)
BUN: 10 mg/dL (ref 8–23)
CO2: 27 mmol/L (ref 22–32)
Calcium: 8.7 mg/dL — ABNORMAL LOW (ref 8.9–10.3)
Chloride: 91 mmol/L — ABNORMAL LOW (ref 98–111)
Creatinine, Ser: 0.52 mg/dL (ref 0.44–1.00)
GFR, Estimated: >60 mL/min (ref >60)
Glucose, Bld: 114 mg/dL — ABNORMAL HIGH (ref 70–99)
Potassium: 3.7 mmol/L (ref 3.5–5.1)
Sodium: 124 mmol/L — ABNORMAL LOW (ref 135–145)
Total Bilirubin: 0.5 mg/dL (ref 0.3–1.2)
Total Protein: 6.2 g/dL — ABNORMAL LOW (ref 6.5–8.1)

## 2022-01-14 LAB — TECHNOLOGIST SMEAR REVIEW

## 2022-01-14 LAB — TSH: TSH: 0.482 u[IU]/mL (ref 0.350–4.500)

## 2022-01-14 LAB — RESPIRATORY PANEL BY PCR

## 2022-01-14 LAB — T4, FREE: Free T4: 1.01 ng/dL (ref 0.61–1.12)

## 2022-01-14 LAB — FERRITIN: Ferritin: 44 ng/mL (ref 11–307)

## 2022-01-14 LAB — STREP PNEUMONIAE URINARY ANTIGEN: Strep Pneumo Urinary Antigen: NEGATIVE

## 2022-01-14 LAB — IRON AND TIBC
Iron: 17 ug/dL — ABNORMAL LOW (ref 28–170)
Saturation Ratios: 6 % — ABNORMAL LOW (ref 10.4–31.8)
TIBC: 283 ug/dL (ref 250–450)
UIBC: 266 ug/dL

## 2022-01-14 LAB — PHOSPHORUS: Phosphorus: 2.7 mg/dL (ref 2.5–4.6)

## 2022-01-14 MED ORDER — METHYLPREDNISOLONE SODIUM SUCC 125 MG IJ SOLR: 80.0000 mg | INTRAMUSCULAR | Status: DC

## 2022-01-14 MED ORDER — ENSURE ENLIVE PO LIQD
237.0000 mL | Freq: Two times a day (BID) | ORAL | Status: DC
  Administered 2022-01-14 – 2022-01-21 (×13): 237 mL via ORAL

## 2022-01-14 MED ORDER — ALBUTEROL SULFATE (2.5 MG/3ML) 0.083% IN NEBU
2.5000 mg | INHALATION_SOLUTION | RESPIRATORY_TRACT | Status: DC | PRN
  Administered 2022-01-15 – 2022-01-21 (×2): 2.5 mg via RESPIRATORY_TRACT
  Filled 2022-01-14 (×2): qty 3

## 2022-01-14 MED ORDER — SODIUM CHLORIDE 0.9 % IV SOLN
2.0000 g | Freq: Two times a day (BID) | INTRAVENOUS | Status: DC
  Administered 2022-01-14 – 2022-01-17 (×8): 2 g via INTRAVENOUS
  Filled 2022-01-14 (×7): qty 12.5
  Filled 2022-01-14: qty 2
  Filled 2022-01-14 (×2): qty 12.5
  Filled 2022-01-14: qty 2

## 2022-01-14 MED ORDER — IPRATROPIUM-ALBUTEROL 0.5-2.5 (3) MG/3ML IN SOLN
3.0000 mL | Freq: Four times a day (QID) | RESPIRATORY_TRACT | Status: DC
  Administered 2022-01-14 (×2): 3 mL via RESPIRATORY_TRACT
  Filled 2022-01-14 (×2): qty 3

## 2022-01-14 MED ORDER — LORATADINE 10 MG PO TABS
10.0000 mg | ORAL_TABLET | Freq: Every day | ORAL | Status: DC
  Administered 2022-01-14 – 2022-01-21 (×8): 10 mg via ORAL
  Filled 2022-01-14 (×8): qty 1

## 2022-01-14 MED ORDER — PANTOPRAZOLE SODIUM 40 MG IV SOLR
40.0000 mg | Freq: Two times a day (BID) | INTRAVENOUS | Status: DC
  Administered 2022-01-14 – 2022-01-21 (×15): 40 mg via INTRAVENOUS
  Filled 2022-01-14 (×15): qty 10

## 2022-01-14 MED ORDER — PROSIGHT PO TABS: 1.0000 | ORAL_TABLET | Freq: Every day | ORAL | Status: DC

## 2022-01-14 MED ORDER — IOHEXOL 300 MG/ML  SOLN
100.0000 mL | Freq: Once | INTRAMUSCULAR | Status: AC | PRN
  Administered 2022-01-14: 100 mL via INTRAVENOUS

## 2022-01-14 MED ORDER — AMIODARONE HCL 200 MG PO TABS
400.0000 mg | ORAL_TABLET | Freq: Two times a day (BID) | ORAL | Status: AC
  Administered 2022-01-14 – 2022-01-20 (×14): 400 mg via ORAL
  Filled 2022-01-14 (×14): qty 2

## 2022-01-14 MED ORDER — IPRATROPIUM BROMIDE 0.02 % IN SOLN: 0.5000 mg | Freq: Three times a day (TID) | RESPIRATORY_TRACT | Status: DC

## 2022-01-14 MED ORDER — METHYLPREDNISOLONE SODIUM SUCC 40 MG IJ SOLR
40.0000 mg | INTRAMUSCULAR | Status: DC
  Administered 2022-01-15 – 2022-01-16 (×2): 40 mg via INTRAVENOUS
  Filled 2022-01-14 (×2): qty 1

## 2022-01-14 MED ORDER — ADULT MULTIVITAMIN W/MINERALS CH
1.0000 | ORAL_TABLET | Freq: Every day | ORAL | Status: DC
  Administered 2022-01-14 – 2022-01-21 (×8): 1 via ORAL
  Filled 2022-01-14 (×8): qty 1

## 2022-01-14 MED ORDER — LEVALBUTEROL HCL 0.63 MG/3ML IN NEBU: 0.6300 mg | INHALATION_SOLUTION | Freq: Three times a day (TID) | RESPIRATORY_TRACT | Status: DC

## 2022-01-14 NOTE — Plan of Care (Signed)
Consult noted for [place]. Upon chart review, it appears family would like to investigate hospice care and they have spoken with [facility]  hospice liaison this afternoon regarding services.  Per notes the family would like to talk amongst themselves about the information provided and plans moving forward.  PMT will follow-up Monday if needed.

## 2022-01-14 NOTE — Progress Notes (Signed)
Assisted her to [facility], but she could not void. Bladder scan 146 ml. Dr. Si Raider aware. Will continue to monitor.

## 2022-01-14 NOTE — Plan of Care (Signed)
  Problem: Respiratory: Goal: Levels of oxygenation will improve Outcome: Progressing   Problem: Education: Goal: Knowledge of General Education information will improve Description: Including pain rating scale, medication(s)/side effects and non-pharmacologic comfort measures Outcome: Progressing   Problem: Pain Managment: Goal: General experience of comfort will improve Outcome: Progressing   Problem: Safety: Goal: Ability to remain free from injury will improve Outcome: Progressing

## 2022-01-14 NOTE — Progress Notes (Signed)
[place] hospital liaison note    Request received from [facility] to reach out to patient's family to discuss hospice services. Talked with patient's daughter Alyse Low by phone. Hospice services and philosophy of care were discussed and all questions were answered.   Daughter reports that she wants to talk with her brother and will call back if she has any questions.   Thank you for the opportunity to participate in this patient's care Jhonnie Garner, BSN, RN Hospice hospital liaison [phone]

## 2022-01-14 NOTE — Progress Notes (Addendum)
Pt is complaining of unable to pee. Bladder scan showed 412. NP Randol Kern made aware. Will continue to monitor.  Update 0456: NP Randol Kern placed order for in and out cath once. Will continue to monitor.  Update 0536: Pt was in and out cath with 450 ml urine. Pt bladder scan showed 0 after in and out cath. Will continue to monitor.  Update 0620: pt is compalining of worsening SOB. PRN breathing administered. Respiratory at bedside. NP Randol Kern made aware. Will continue to monitor.  Updte 4961: Per pt report" I feel okay right now". No new order place at this time. Will continue to monitor.

## 2022-01-14 NOTE — Consult Note (Signed)
Pharmacy Antibiotic Note  Kristi Clark is a 71 y.o. female admitted on 01/13/2022 with COPDE with history of Pseudomonas (respiratory culture, 10/2018). PMH significant for keratosis, basal cell carcinoma, COPD, depression, GERD, HLD, HTN, CAD. CTA negative for PE, but revealed possible interstitial pneumonia and/or pulmonary edema. Procalcitonin negative. Pharmacy has been consulted for Cefepime dosing.  Plan: Day 2 of antibiotics Discontinue Doxycycline and Azithromycin  Initiate Cefepime 2g Q12H Continue to monitor renal function and follow culture results   Height: '4\' 5"'$  (134.6 cm) Weight: 44.4 kg (97 lb 14.2 oz) IBW/kg (Calculated) : 29.4  Temp (24hrs), Avg:98.5 F (36.9 C), Min:97.8 F (36.6 C), Max:99.3 F (37.4 C)  Recent Labs  Lab 01/13/22 0353 01/13/22 0630 01/13/22 0835 01/14/22 0411  WBC 8.3  --   --  10.5  CREATININE 0.53  --   --  0.52  LATICACIDVEN  --  0.7 0.9  --     Estimated Creatinine Clearance: 35 mL/min (by C-G formula based on SCr of 0.52 mg/dL).    Allergies  Allergen Reactions   Clindamycin/Lincomycin Hives   Prednisone Other (See Comments)    Psychosis   Amoxicillin Rash   Avelox [Moxifloxacin Hcl In Nacl] Rash   Codeine Sulfate Nausea Only   Penicillins Rash   Sulfa Antibiotics Rash    Antimicrobials this admission: 11/9 Doxycycline >> 11/10 11/9 Azithromycin >> 11/10 11/10 Cefepime >>  Dose adjustments this admission: N/A  Microbiology results: 11/9 BCx: NG<24H 11/10 RVPP: negative  11/10 Sputum: ordered   Thank you for allowing pharmacy to be a part of this patient's care.  Gretel Acre, PharmD PGY1 Pharmacy Resident 01/14/2022 10:17 AM

## 2022-01-14 NOTE — Progress Notes (Signed)
PHARMACIST - PHYSICIAN COMMUNICATION   CONCERNING: Methylprednisolone IV    Current order: Methylprednisolone IV '40mg'$  IV q12h     DESCRIPTION: Per [place] Protocol:   IV methylprednisolone will be converted to either a q12h or q24h frequency with the same total daily dose (TDD).  Ordered Dose: 1 to 125 mg TDD; convert to: TDD q24h.  Ordered Dose: 126 to 250 mg TDD; convert to: TDD div q12h.  Ordered Dose: >250 mg TDD; DAW.  Order has been adjusted to: Methylprednisolone IV 80 mg q24h   Noralee Space , PharmD Clinical Pharmacist  01/14/2022 9:55 AM

## 2022-01-14 NOTE — Evaluation (Signed)
Clinical/Bedside Swallow Evaluation Patient Details  Name: Kristi Clark MRN: [id number] Date of Birth: 08/24/1950  Today's Date: 01/14/2022 Time: SLP Start Time (ACUTE ONLY): 0932 SLP Stop Time (ACUTE ONLY): 3557 SLP Time Calculation (min) (ACUTE ONLY): 17 min  Past Medical History:  Past Medical History:  Diagnosis Date   AK (actinic keratosis) 10/25/2017   left forehead   BCC (basal cell carcinoma) 10/25/2017   left upper lateral eyelid, Moh's   COPD (chronic obstructive pulmonary disease) (HCC)    Depression    GERD (gastroesophageal reflux disease)    Hyperlipidemia    Hypertension    Menopause    Osteopenia    Tobacco abuse    Past Surgical History:  Past Surgical History:  Procedure Laterality Date   CARDIAC CATHETERIZATION     CHOLECYSTECTOMY  2014   COLONOSCOPY WITH PROPOFOL N/A 01/16/2018   Procedure: COLONOSCOPY WITH PROPOFOL;  Surgeon: Lollie Sails, MD;  Location: [facility] ENDOSCOPY;  Service: Endoscopy;  Laterality: N/A;   ESOPHAGOGASTRODUODENOSCOPY (EGD) WITH PROPOFOL N/A 01/16/2018   Procedure: ESOPHAGOGASTRODUODENOSCOPY (EGD) WITH PROPOFOL;  Surgeon: Lollie Sails, MD;  Location: [facility] ENDOSCOPY;  Service: Endoscopy;  Laterality: N/A;   heart stent     LEFT HEART CATH AND CORONARY ANGIOGRAPHY N/A 09/17/2021   Procedure: LEFT HEART CATH AND CORONARY ANGIOGRAPHY;  Surgeon: Dionisio David, MD;  Location: [facility];  Service: Cardiovascular;  Laterality: N/A;   TEE WITHOUT CARDIOVERSION N/A 05/11/2021   Procedure: TRANSESOPHAGEAL ECHOCARDIOGRAM (TEE);  Surgeon: Corey Skains, MD;  Location: [facility] ORS;  Service: Cardiovascular;  Laterality: N/A;   TOTAL ABDOMINAL HYSTERECTOMY     HPI:  Kristi Clark is a 71 y.o. female with medical history significant of keratosis, basal cell carcinoma, COPD, depression, GERD, hyperlipidemia, hypertension, disease, moderate CAD treated medically who is coming to the emergency department with complaints  of progressively worse acute on chronic dyspnea for the past 2 weeks associated with wheezing, occasional productive cough with yellowish sputum, pleuritic chest wall pain, fatigue and decreased appetite.  She has also continued to lose weight. Chest CT revealed Negative for acute pulmonary embolism.    Advanced emphysema/interstitial lung disease with superimposed  interstitial pneumonia and/or pulmonary edema. Small bilateral pleural effusions.    Assessment / Plan / Recommendation  Clinical Impression  Pt presents with adequate oropharyngeal abilities when consuming thin liquids via straw, applesauce and grilled cheese sandwich from her lunch tray. Despite this, her risk of aspiration is increased d/t current respiratory status and medical condition. Recommend continuation of current diet with strict aspiration precautions. SLP Visit Diagnosis: Dysphagia, unspecified (R13.10)    Aspiration Risk  Mild aspiration risk    Diet Recommendation Thin liquid (soft diet)   Liquid Administration via: Straw;Cup Medication Administration: Whole meds with liquid Supervision: Staff to assist with self feeding;Intermittent supervision to cue for compensatory strategies Compensations: Minimize environmental distractions;Slow rate;Small sips/bites Postural Changes: Seated upright at 90 degrees    Other  Recommendations Oral Care Recommendations: Oral care BID    Recommendations for follow up therapy are one component of a multi-disciplinary discharge planning process, led by the attending physician.  Recommendations may be updated based on patient status, additional functional criteria and insurance authorization.  Follow up Recommendations No SLP follow up      Assistance Recommended at Discharge None  Functional Status Assessment Patient has not had a recent decline in their functional status    Swallow Study   General Date of Onset: 01/13/22 HPI: Kristi  CATRIONA Clark is a 71 y.o. female with  medical history significant of keratosis, basal cell carcinoma, COPD, depression, GERD, hyperlipidemia, hypertension, disease, moderate CAD treated medically who is coming to the emergency department with complaints of progressively worse acute on chronic dyspnea for the past 2 weeks associated with wheezing, occasional productive cough with yellowish sputum, pleuritic chest wall pain, fatigue and decreased appetite.  She has also continued to lose weight. Chest CT revealed Negative for acute pulmonary embolism.    Advanced emphysema/interstitial lung disease with superimposed  interstitial pneumonia and/or pulmonary edema. Small bilateral pleural effusions. Type of Study: Bedside Swallow Evaluation Previous Swallow Assessment: none in chart Diet Prior to this Study: Regular;Thin liquids Temperature Spikes Noted: No Respiratory Status: Nasal cannula History of Recent Intubation: No Behavior/Cognition: Alert;Cooperative;Pleasant mood Oral Cavity Assessment: Within Functional Limits Oral Care Completed by SLP: No Vision: Functional for self-feeding Self-Feeding Abilities: Needs assist Patient Positioning: Upright in bed Baseline Vocal Quality: Low vocal intensity Volitional Cough: Strong Volitional Swallow: Able to elicit    Oral/Motor/Sensory Function Overall Oral Motor/Sensory Function: Within functional limits   Ice Chips Ice chips: Not tested   Thin Liquid Thin Liquid: Within functional limits Presentation: Self Fed;Straw    Nectar Thick Nectar Thick Liquid: Not tested   Honey Thick Honey Thick Liquid: Not tested   Puree Puree: Within functional limits Presentation: Spoon   Solid     Solid: Within functional limits Presentation: Self Fed     [place] Catarino B. Rutherford Nail, M.S., CCC-SLP, Mining engineer Certified Brain Injury [place]  [place] Office [phone] Ascom [phone] Fax [phone]

## 2022-01-14 NOTE — Discharge Instructions (Signed)

## 2022-01-14 NOTE — Progress Notes (Addendum)
PROGRESS NOTE    DYLANA SHAW  UEA:[id number] DOB: 05/02/1950 DOA: 01/13/2022 PCP: [name], DO  Outpatient Specialists: cardiology ([place]), pulm ([place])    Brief Narrative:   From admission h and p  Kristi Clark is a 71 y.o. female with medical history significant of keratosis, basal cell carcinoma, COPD, depression, GERD, hyperlipidemia, hypertension, disease, moderate CAD treated medically who is coming to the emergency department with complaints of progressively worse acute on chronic dyspnea for the past 2 weeks associated with wheezing, occasional productive cough with yellowish sputum, pleuritic chest wall pain, fatigue and decreased appetite.  She has also continued to lose weight.  No fever, chills or night sweats. No sore throat, rhinorrhea, dyspnea, wheezing or hemoptysis.  No chest pain, palpitations, diaphoresis, PND, orthopnea, but occasionally gets pitting edema of the lower extremities.  No diarrhea, occasional constipation constipation, no melena or hematochezia.  No flank pain, dysuria, frequency or hematuria.  Has stress incontinence.  No polyuria, polydipsia, polyphagia or blurred vision.    Assessment & Plan:   Principal Problem:   COPD exacerbation ([place]) Active Problems:   Acute on chronic respiratory failure with hypoxia (HCC)   Hyponatremia   GERD (gastroesophageal reflux disease)   Hyperlipidemia   Benign hypertension with chronic kidney disease   CAD (coronary artery disease)   Aortic atherosclerosis (HCC)   Unintentional weight loss   Malnutrition of moderate degree   Normocytic anemia   Transaminitis   Atrial fibrillation with RVR (HCC)   # Acute on chronic hypoxic respiratory failure On 2-3 l at home, here more dyspneic than normal on 3 - [place] o2, wean as able  # COPD with acute exacerbation End-stage COPD, no PE on CTA. Per pulm note hx pseudomonas - cont steroids, bronchodilators - switch abx to cefepime - urine antigens,  sputum for culture if able to produce, follow blood cultures  # End-of-life care - palliative consult  # A-fib New dx. Tsh wnl. Cards following - amio per cards, BB - deferring anticoagulation for now given anemia  # CAD - hold plavix given anemia, continue aspirin and statin for now  # Anemia, normocytic Denies bleeding or melena. Does have dysphagia/odynophagia - iron studies, smear - monitor for now  # Unintentional weight loss # dysphagia, odynophagia - CT abdomen/pelvis - consider GI consult - PPI  # urinary retention? Says required I/o cath in the ED - bladder scan q shift, io cath as needed  # Hyponatremia Acute on chronic, here 124 baseline is around 130 - hold thiazide - further w/u if fails to improve with that     DVT prophylaxis: SCDs Code Status: dnr Family Communication: daughter christy updated telephonically 11/10  Level of care: Progressive Status is: Inpatient Remains inpatient appropriate because: severity of illness    Consultants:  cardiology  Procedures: none  Antimicrobials:  Doxy>cefepime    Subjective: Feels run down, no appetite, dyspneic  Objective: Vitals:   01/14/22 0339 01/14/22 0400 01/14/22 0455 01/14/22 0758  BP: (!) 129/49 (!) 178/63  (!) 177/66  Pulse: 93 88  85  Resp: (!) 27 20  (!) 22  Temp: 99 F (37.2 C)   97.8 F (36.6 C)  TempSrc:    Oral  SpO2: 100% 100%  96%  Weight:   44.4 kg   Height:        Intake/Output Summary (Last 24 hours) at 01/14/2022 0949 Last data filed at 01/14/2022 0700 Gross per 24 hour  Intake 221.41 ml  Output 450 ml  Net -228.59 ml   Filed Weights   01/13/22 0347 01/14/22 0455  Weight: 41.3 kg 44.4 kg    Examination:  General exam: Appears chronically ill Respiratory system: Clear to auscultation. Respiratory effort normal. Cardiovascular system: S1 & S2 heard, RRR. No JVD, murmurs, rubs, gallops or clicks. No pedal edema. Gastrointestinal system: Abdomen is  nondistended, soft and nontender. No organomegaly or masses felt. Normal bowel sounds heard. Central nervous system: Alert and oriented. No focal neurological deficits. Extremities: Symmetric 5 x 5 power. Skin: No rashes, lesions or ulcers Psychiatry: Judgement and insight appear normal. Mood & affect appropriate.     Data Reviewed: I have personally reviewed following labs and imaging studies  CBC: Recent Labs  Lab 01/13/22 0353 01/14/22 0411  WBC 8.3 10.5  NEUTROABS 6.5  --   HGB 8.1* 8.3*  HCT 24.6* 25.1*  MCV 87.9 86.9  PLT 319 416   Basic Metabolic Panel: Recent Labs  Lab 01/13/22 0353 01/13/22 2226 01/14/22 0411  NA 124* 125* 124*  K 3.7  --  3.7  CL 90*  --  91*  CO2 28  --  27  GLUCOSE 107*  --  114*  BUN 15  --  10  CREATININE 0.53  --  0.52  CALCIUM 8.6*  --  8.7*  MG  --  1.6*  --   PHOS  --   --  2.7   GFR: Estimated Creatinine Clearance: 35 mL/min (by C-G formula based on SCr of 0.52 mg/dL). Liver Function Tests: Recent Labs  Lab 01/13/22 0353 01/14/22 0411  AST 51* 44*  ALT 52* 52*  ALKPHOS 96 97  BILITOT 0.6 0.5  PROT 6.0* 6.2*  ALBUMIN 2.6* 2.7*   Recent Labs  Lab 01/13/22 0353  LIPASE 67*   No results for input(s): "AMMONIA" in the last 168 hours. Coagulation Profile: No results for input(s): "INR", "PROTIME" in the last 168 hours. Cardiac Enzymes: No results for input(s): "CKTOTAL", "CKMB", "CKMBINDEX", "TROPONINI" in the last 168 hours. BNP (last 3 results) No results for input(s): "PROBNP" in the last 8760 hours. HbA1C: No results for input(s): "HGBA1C" in the last 72 hours. CBG: No results for input(s): "GLUCAP" in the last 168 hours. Lipid Profile: No results for input(s): "CHOL", "HDL", "LDLCALC", "TRIG", "CHOLHDL", "LDLDIRECT" in the last 72 hours. Thyroid Function Tests: Recent Labs    01/14/22 0411  TSH 0.482  FREET4 1.01   Anemia Panel: No results for input(s): "VITAMINB12", "FOLATE", "FERRITIN", "TIBC", "IRON",  "RETICCTPCT" in the last 72 hours. Urine analysis:    Component Value Date/Time   COLORURINE YELLOW (A) 01/13/2022 0352   APPEARANCEUR CLEAR (A) 01/13/2022 0352   APPEARANCEUR Clear 12/27/2021 1316   LABSPEC 1.011 01/13/2022 0352   PHURINE 6.0 01/13/2022 0352   GLUCOSEU NEGATIVE 01/13/2022 0352   HGBUR NEGATIVE 01/13/2022 0352   BILIRUBINUR NEGATIVE 01/13/2022 0352   BILIRUBINUR Negative 12/27/2021 1316   KETONESUR 5 (A) 01/13/2022 0352   PROTEINUR 30 (A) 01/13/2022 0352   NITRITE NEGATIVE 01/13/2022 0352   LEUKOCYTESUR NEGATIVE 01/13/2022 0352   Sepsis Labs: '@LABRCNTIP'$ (procalcitonin:4,lacticidven:4)  ) Recent Results (from the past 240 hour(s))  Novel Coronavirus, NAA ([facility])     Status: None   Collection Time: 01/04/22  4:04 PM   Specimen: Saline  Result Value Ref Range Status   SARS-CoV-2, NAA Not Detected Not Detected Final    Comment: This nucleic acid amplification test was developed and its performance characteristics determined by [employer]. Nucleic  acid amplification tests include RT-PCR and TMA. This test has not been FDA cleared or approved. This test has been authorized by FDA under an Emergency Use Authorization (EUA). This test is only authorized for the duration of time the declaration that circumstances exist justifying the authorization of the emergency use of in vitro diagnostic tests for detection of SARS-CoV-2 virus and/or diagnosis of COVID-19 infection under section 564(b)(1) of the Act, 21 U.S.C. 268TMH-9(Q) (1), unless the authorization is terminated or revoked sooner. When diagnostic testing is negative, the possibility of a false negative result should be considered in the context of a patient's recent exposures and the presence of clinical signs and symptoms consistent with COVID-19. An individual without symptoms of COVID-19 and who is not shedding SARS-CoV-2 virus wo uld expect to have a negative (not detected) result in this  assay.   Resp Panel by RT-PCR (Flu A&B, Covid) Anterior Nasal Swab     Status: None   Collection Time: 01/13/22  3:53 AM   Specimen: Anterior Nasal Swab  Result Value Ref Range Status   SARS Coronavirus 2 by RT PCR NEGATIVE NEGATIVE Final    Comment: (NOTE) SARS-CoV-2 target nucleic acids are NOT DETECTED.  The SARS-CoV-2 RNA is generally detectable in upper respiratory specimens during the acute phase of infection. The lowest concentration of SARS-CoV-2 viral copies this assay can detect is 138 copies/mL. A negative result does not preclude SARS-Cov-2 infection and should not be used as the sole basis for treatment or other patient management decisions. A negative result may occur with  improper specimen collection/handling, submission of specimen other than nasopharyngeal swab, presence of viral mutation(s) within the areas targeted by this assay, and inadequate number of viral copies(<138 copies/mL). A negative result must be combined with clinical observations, patient history, and epidemiological information. The expected result is Negative.  Fact Sheet for Patients:  EntrepreneurPulse.com.au  Fact Sheet for Healthcare Providers:  IncredibleEmployment.be  This test is no t yet approved or cleared by the [place] FDA and  has been authorized for detection and/or diagnosis of SARS-CoV-2 by FDA under an Emergency Use Authorization (EUA). This EUA will remain  in effect (meaning this test can be used) for the duration of the COVID-19 declaration under Section 564(b)(1) of the Act, 21 U.S.C.section 360bbb-3(b)(1), unless the authorization is terminated  or revoked sooner.       Influenza A by PCR NEGATIVE NEGATIVE Final   Influenza B by PCR NEGATIVE NEGATIVE Final    Comment: (NOTE) The Xpert Xpress SARS-CoV-2/FLU/RSV plus assay is intended as an aid in the diagnosis of influenza from Nasopharyngeal swab specimens and should not be  used as a sole basis for treatment. Nasal washings and aspirates are unacceptable for Xpert Xpress SARS-CoV-2/FLU/RSV testing.  Fact Sheet for Patients: EntrepreneurPulse.com.au  Fact Sheet for Healthcare Providers: IncredibleEmployment.be  This test is not yet approved or cleared by the [place] FDA and has been authorized for detection and/or diagnosis of SARS-CoV-2 by FDA under an Emergency Use Authorization (EUA). This EUA will remain in effect (meaning this test can be used) for the duration of the COVID-19 declaration under Section 564(b)(1) of the Act, 21 U.S.C. section 360bbb-3(b)(1), unless the authorization is terminated or revoked.  Performed at [facility], [address] [place], [place] [address]   Culture, blood (routine x 2)     Status: None (Preliminary result)   Collection Time: 01/13/22  6:30 AM   Specimen: BLOOD  Result Value Ref Range Status  Specimen Description BLOOD LEFT FOREARM  Final   Special Requests   Final    BOTTLES DRAWN AEROBIC AND ANAEROBIC Blood Culture results may not be optimal due to an inadequate volume of blood received in culture bottles   Culture   Final    NO GROWTH < 24 HOURS Performed at [facility], [address] [place], [place] [address]    Report Status PENDING  Incomplete  Culture, blood (routine x 2)     Status: None (Preliminary result)   Collection Time: 01/13/22  6:30 AM   Specimen: BLOOD  Result Value Ref Range Status   Specimen Description BLOOD RIGHT FOREARM  Final   Special Requests   Final    BOTTLES DRAWN AEROBIC AND ANAEROBIC Blood Culture adequate volume   Culture   Final    NO GROWTH < 24 HOURS Performed at [facility], [address] [place], [place] [address]    Report Status PENDING  Incomplete  Respiratory (~20 pathogens) panel by PCR     Status: None   Collection Time: 01/14/22  2:05 AM   Specimen: Nasopharyngeal Swab;  Respiratory  Result Value Ref Range Status   Adenovirus NOT DETECTED NOT DETECTED Final   Coronavirus 229E NOT DETECTED NOT DETECTED Final    Comment: (NOTE) The Coronavirus on the Respiratory Panel, DOES NOT test for the novel  Coronavirus (2019 nCoV)    Coronavirus HKU1 NOT DETECTED NOT DETECTED Final   Coronavirus NL63 NOT DETECTED NOT DETECTED Final   Coronavirus OC43 NOT DETECTED NOT DETECTED Final   Metapneumovirus NOT DETECTED NOT DETECTED Final   Rhinovirus / Enterovirus NOT DETECTED NOT DETECTED Final   Influenza A NOT DETECTED NOT DETECTED Final   Influenza B NOT DETECTED NOT DETECTED Final   Parainfluenza Virus 1 NOT DETECTED NOT DETECTED Final   Parainfluenza Virus 2 NOT DETECTED NOT DETECTED Final   Parainfluenza Virus 3 NOT DETECTED NOT DETECTED Final   Parainfluenza Virus 4 NOT DETECTED NOT DETECTED Final   Respiratory Syncytial Virus NOT DETECTED NOT DETECTED Final   Bordetella pertussis NOT DETECTED NOT DETECTED Final   Bordetella Parapertussis NOT DETECTED NOT DETECTED Final   Chlamydophila pneumoniae NOT DETECTED NOT DETECTED Final   Mycoplasma pneumoniae NOT DETECTED NOT DETECTED Final    Comment: Performed at [facility], [place] [address] [place], [place] [address]         Radiology Studies: CT Angio Chest Pulmonary Embolism (PE) W or WO Contrast  Result Date: 01/14/2022 CLINICAL DATA:  Shortness of breath with productive cough EXAM: CT ANGIOGRAPHY CHEST WITH CONTRAST TECHNIQUE: Multidetector CT imaging of the chest was performed using the standard protocol during bolus administration of intravenous contrast. Multiplanar CT image reconstructions and MIPs were obtained to evaluate the vascular anatomy. RADIATION DOSE REDUCTION: This exam was performed according to the departmental dose-optimization program which includes automated exposure control, adjustment of the mA and/or kV according to patient size and/or use of iterative reconstruction technique.  CONTRAST:  71m OMNIPAQUE IOHEXOL 350 MG/ML SOLN COMPARISON:  Radiograph 01/13/2022 and CT chest angiogram 09/11/2021 FINDINGS: Cardiovascular: Satisfactory opacification of the pulmonary arteries to the segmental level. No evidence of pulmonary embolism. Cardiomegaly. No pericardial effusion. Advanced coronary artery and aortic atherosclerotic calcification. Locule of gas anterior to the right thyroid lobe likely due to intravenous injection. Reflux of contrast into the hepatic veins can be seen with elevated right heart pressures. Mediastinum/Nodes: No enlarged mediastinal, hilar, or axillary lymph nodes. Thyroid gland, trachea, and  esophagus demonstrate no significant findings. Lungs/Pleura: Diffuse emphysematous change. Diffuse bronchial wall thickening. Peripheral predominant reticular opacities greatest in the left lower lung. Interlobular septal thickening is diffuse throughout both lungs but greatest in the left lower lobe. Patchy ground-glass and nodular opacities greatest in the left lower lobe. Small left-greater-than-right pleural effusions and associated atelectasis. These findings are new or increased since 09/11/2021. Upper Abdomen: Calcified plaque at the origin of the celiac artery and SMA cause advanced narrowing. No acute abnormality. Musculoskeletal: No chest wall abnormality. No acute osseous findings. Review of the MIP images confirms the above findings. IMPRESSION: Negative for acute pulmonary embolism. Advanced emphysema/interstitial lung disease with superimposed interstitial pneumonia and/or pulmonary edema. Small bilateral pleural effusions. Cardiomegaly and advanced coronary artery atherosclerotic calcification. Reflux of contrast into the hepatic veins can be seen with elevated right heart pressures. Aortic Atherosclerosis (ICD10-I70.0) and Emphysema (ICD10-J43.9). 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 Electronically Signed   By: Placido Sou M.D.   On: 01/14/2022 00:34   DG Chest Portable 1 View  Result Date: 01/13/2022 CLINICAL DATA:  71 year old female with shortness of breath. Smoker. Testing for COVID-19. Pending. EXAM: PORTABLE CHEST 1 VIEW COMPARISON:  Chest radiographs 01/04/2022 and earlier, including CTA 09/11/2021. FINDINGS: Portable AP upright view at 0341 hours. Severe chronic lung disease with widespread coarse pulmonary interstitial opacity. Lower lung volumes compared to last month. Also, increased confluence of opacity at the left lung base with suspicion now of small left versus bilateral pleural effusions. No pneumothorax. Stable cardiac size and mediastinal contours. No pulmonary edema suspected. No acute osseous abnormality identified. Paucity of bowel gas. IMPRESSION: Severe chronic lung disease with suspected new small pleural effusions and increased confluence of opacity at the left lung base such as due to acute infectious exacerbation. Electronically Signed   By: Genevie Ann M.D.   On: 01/13/2022 03:52        Scheduled Meds:  amiodarone  400 mg Oral BID   aspirin EC  81 mg Oral Daily   atorvastatin  40 mg Oral Daily   budesonide (PULMICORT) nebulizer solution  0.5 mg Nebulization BID   clopidogrel  75 mg Oral Daily   doxycycline  100 mg Oral Q12H   hydrALAZINE  12.5 mg Oral BID   ipratropium  0.5 mg Nebulization TID   isosorbide mononitrate  60 mg Oral Daily   levalbuterol  0.63 mg Nebulization TID   metoprolol succinate  50 mg Oral Daily   mirtazapine  30 mg Oral QHS   multivitamin-lutein  1 capsule Oral Daily   nicotine  14 mg Transdermal Daily   Continuous Infusions:   LOS: 1 day     Desma Maxim, MD [facility]   If 7PM-7AM, please contact night-coverage [email or web address] Password [facility] 01/14/2022, 9:49 AM

## 2022-01-14 NOTE — Progress Notes (Signed)
  Transition of Care [facility]) Screening Note   Patient Details  Name: Kristi Clark Date of Birth: June 18, 1950   Transition of Care [facility]) CM/SW Contact:    Quin Hoop, LCSW Phone Number: 01/14/2022, 11:33 AM    Transition of Care Department [facility]) has reviewed patient and no TOC needs have been identified at this time. We will continue to monitor patient advancement through interdisciplinary progression rounds. If new patient transition needs arise, please place a TOC consult.   [place], [place]

## 2022-01-14 NOTE — Progress Notes (Signed)
*  PRELIMINARY RESULTS* Echocardiogram 2D Echocardiogram has been performed.  Kristi Clark 01/14/2022, 10:50 AM

## 2022-01-14 NOTE — Progress Notes (Signed)
   01/14/22 0058  Assess: MEWS Score  Temp 99.3 F (37.4 C)  BP (!) 194/65  MAP (mmHg) 101  Pulse Rate (!) 102  Resp (!) 34  SpO2 94 %  O2 Device Nasal Cannula  O2 Flow Rate (L/min) 4 L/min  Assess: MEWS Score  MEWS Temp 0  MEWS Systolic 0  MEWS Pulse 1  MEWS RR 2  MEWS LOC 0  MEWS Score 3  MEWS Score Color Yellow  Assess: if the MEWS score is Yellow or Red  Were vital signs taken at a resting state? Yes  Focused Assessment No change from prior assessment  Does the patient meet 2 or more of the SIRS criteria? No  MEWS guidelines implemented *See Row Information* No, previously red, continue vital signs every 4 hours  Treat  MEWS Interventions Administered scheduled meds/treatments  Take Vital Signs  Increase Vital Sign Frequency  Red: Q 1hr X 4 then Q 4hr X 4, if remains red, continue Q 4hrs  Escalate  MEWS: Escalate Red: discuss with charge nurse/RN and provider, consider discussing with RRT  Notify: Charge Nurse/RN  Name of Charge Nurse/RN Notified Ernestina Penna  Date Charge Nurse/RN Notified 01/14/22  Time Charge Nurse/RN Notified 0100  Assess: SIRS CRITERIA  SIRS Temperature  0  SIRS Pulse 1  SIRS Respirations  1  SIRS WBC 1  SIRS Score Sum  3

## 2022-01-14 NOTE — Progress Notes (Signed)
Initial Nutrition Assessment  DOCUMENTATION CODES:   Non-severe (moderate) malnutrition in context of chronic illness  INTERVENTION:  Ensure Plus High Protein po BID, each supplement provides 350 kcal and 20 grams of protein (pt prefers chocolate) Magic cup TID with meals, each supplement provides 290 kcal and 9 grams of protein Coupons provided for Ensure supplements "High Calorie, High Protein Nutrition Therapy" handout added to AVS  NUTRITION DIAGNOSIS:   Moderate Malnutrition related to chronic illness (COPD) as evidenced by mild fat depletion, moderate muscle depletion, energy intake < 75% for > or equal to 1 month.  GOAL:   Patient will meet greater than or equal to 90% of their needs  MONITOR:   PO intake, Supplement acceptance, Labs, Weight trends  REASON FOR ASSESSMENT:   Malnutrition Screening Tool    ASSESSMENT:   Pt admitted from home with SOB r/t COPD exacerbation. Also noted to have recurrent CAP and new onset afib. PMH significant for keratosis, basal cell carcinoma, COPD, depression, GERD, HLD, HTN, moderate CAD.  Spoke with pt and family member at bedside. She reports that she has been eating poorly for a while secondary to difficulty breathing. She used to eat breakfast, a snack and dinner but now is only able to take bites of food. She does not have bottom dentures, so has been having to eat softer, more easy-to-chew textures. This morning she ordered eggs and states that she felt like food was getting stuck in her chest. She is concerned for risk of aspiration. Noted ST following and plans to assess patient.   She endorses unintentional weight loss which started several months ago d/t worsening medical conditions and a decreased appetite. She has been trying to drink 1 Boost daily to increase nutritional intake and is agreeable to try to increase to twice daily.   Reviewed documented weight history. It appears that within the last year, her weight has  gradually been declining. Her weight on 01/06/21 was 51.1 kg. Current admit weight is 44.4 kg. This is a 13.1% weight loss which is concerning but not clinically significant for time frame.   Pt reports that she had many outpatient appointments coming up including plans to see an outpatient Dietitian here at the [facility].   Noted pending consult for Pallative Care. Coupons provided for Ensure for affordability of recommendation to increase frequency. Discussed ways to optimize nutritional adequacy while eating less including adding sauces, gravies and cheese to foods or avocado to toast and other foods.   Medications: solu-medrol, remeron, ocuvite-lutein  Labs: sodium 124, AST 44, ALT 52  NUTRITION - FOCUSED PHYSICAL EXAM:  Flowsheet Row Most Recent Value  Orbital Region Mild depletion  Upper Arm Region Severe depletion  Thoracic and Lumbar Region Mild depletion  Buccal Region Moderate depletion  Temple Region Mild depletion  Clavicle Bone Region Severe depletion  Clavicle and Acromion Bone Region Moderate depletion  Scapular Bone Region Moderate depletion  Dorsal Hand Moderate depletion  Patellar Region Moderate depletion  Anterior Thigh Region Moderate depletion  Posterior Calf Region Moderate depletion  Edema (RD Assessment) None  Hair Reviewed  Eyes Reviewed  Mouth Other (Comment)  [edentulous- only wears top dentures]  Skin Reviewed  Nails Reviewed       Diet Order:   Diet Order             DIET SOFT Room service appropriate? Yes; Fluid consistency: Thin; Fluid restriction: 1200 mL Fluid  Diet effective now  EDUCATION NEEDS:   Education needs have been addressed  Skin:  Skin Assessment: Reviewed RN Assessment  Last BM:  11/8  Height:   Ht Readings from Last 1 Encounters:  01/14/22 '4\' 5"'$  (1.346 m)    Weight:   Wt Readings from Last 1 Encounters:  01/14/22 44.4 kg   BMI:  Body mass index is 24.5  kg/m.  Estimated Nutritional Needs:   Kcal:  1100-1300  Protein:  60-75g  Fluid:  1.1-1.3L   Clayborne Dana, RDN, LDN Clinical Nutrition

## 2022-01-14 NOTE — Plan of Care (Signed)
  Problem: Respiratory: Goal: Ability to maintain a clear airway will improve Outcome: Progressing   Problem: Education: Goal: Knowledge of General Education information will improve Description: Including pain rating scale, medication(s)/side effects and non-pharmacologic comfort measures Outcome: Progressing   Problem: Clinical Measurements: Goal: Cardiovascular complication will be avoided Outcome: Progressing   Problem: Pain Managment: Goal: General experience of comfort will improve Outcome: Progressing   Problem: Safety: Goal: Ability to remain free from injury will improve Outcome: Progressing

## 2022-01-14 NOTE — Progress Notes (Signed)
SUBJECTIVE: Feeling weak   Vitals:   01/14/22 0339 01/14/22 0400 01/14/22 0455 01/14/22 0758  BP: (!) 129/49 (!) 178/63  (!) 177/66  Pulse: 93 88  85  Resp: (!) 27 20  (!) 22  Temp: 99 F (37.2 C)   97.8 F (36.6 C)  TempSrc:    Oral  SpO2: 100% 100%  96%  Weight:   44.4 kg   Height:        Intake/Output Summary (Last 24 hours) at 01/14/2022 0908 Last data filed at 01/14/2022 0700 Gross per 24 hour  Intake 221.41 ml  Output 450 ml  Net -228.59 ml    LABS: Basic Metabolic Panel: Recent Labs    01/13/22 0353 01/13/22 2226 01/14/22 0411  NA 124* 125* 124*  K 3.7  --  3.7  CL 90*  --  91*  CO2 28  --  27  GLUCOSE 107*  --  114*  BUN 15  --  10  CREATININE 0.53  --  0.52  CALCIUM 8.6*  --  8.7*  MG  --  1.6*  --   PHOS  --   --  2.7   Liver Function Tests: Recent Labs    01/13/22 0353 01/14/22 0411  AST 51* 44*  ALT 52* 52*  ALKPHOS 96 97  BILITOT 0.6 0.5  PROT 6.0* 6.2*  ALBUMIN 2.6* 2.7*   Recent Labs    01/13/22 0353  LIPASE 67*   CBC: Recent Labs    01/13/22 0353 01/14/22 0411  WBC 8.3 10.5  NEUTROABS 6.5  --   HGB 8.1* 8.3*  HCT 24.6* 25.1*  MCV 87.9 86.9  PLT 319 346   Cardiac Enzymes: No results for input(s): "CKTOTAL", "CKMB", "CKMBINDEX", "TROPONINI" in the last 72 hours. BNP: Invalid input(s): "POCBNP" D-Dimer: Recent Labs    01/13/22 2226  DDIMER 2.48*   Hemoglobin A1C: No results for input(s): "HGBA1C" in the last 72 hours. Fasting Lipid Panel: No results for input(s): "CHOL", "HDL", "LDLCALC", "TRIG", "CHOLHDL", "LDLDIRECT" in the last 72 hours. Thyroid Function Tests: Recent Labs    01/14/22 0411  TSH 0.482   Anemia Panel: No results for input(s): "VITAMINB12", "FOLATE", "FERRITIN", "TIBC", "IRON", "RETICCTPCT" in the last 72 hours.   PHYSICAL EXAM General: Well developed, well nourished, in no acute distress HEENT:  Normocephalic and atramatic Neck:  No JVD.  Lungs: Clear bilaterally to auscultation and  percussion. Heart: HRRR . Normal S1 and S2 without gallops or murmurs.  Abdomen: Bowel sounds are positive, abdomen soft and non-tender  Msk:  Back normal, normal gait. Normal strength and tone for age. Extremities: No clubbing, cyanosis or edema.   Neuro: Alert and oriented X 3. Psych:  Good affect, responds appropriately  TELEMETRY: Sinus rhythm  ASSESSMENT AND PLAN: Status post atrial fibrillation with rapid ventricular response rate.  Patient has converted overnight to sinus rhythm.  Advise changing IV amiodarone to p.o. amiodarone 400 p.o. twice daily.  As far as anticoagulation if there is no GI bleed consider Eliquis 2.5 twice daily.  Patient can be discharged cardiac point of view with a follow-up in the office Monday at 39 in my office  Principal Problem:   COPD exacerbation ([place]) Active Problems:   GERD (gastroesophageal reflux disease)   Hyperlipidemia   Benign hypertension with chronic kidney disease   CAD (coronary artery disease)   Hyponatremia   Aortic atherosclerosis ([place])   Unintentional weight loss   Malnutrition of moderate degree   Acute on chronic respiratory  failure with hypoxia (HCC)   Normocytic anemia   Transaminitis   Atrial fibrillation with RVR (HCC)    Izael Bessinger A, MD, [facility] 01/14/2022 9:08 AM

## 2022-01-14 NOTE — TOC Initial Note (Signed)
Transition of Care [facility]) - Initial/Assessment Note    Patient Details  Name: Kristi Clark MRN: [id number] Date of Birth: November 24, 1950  Transition of Care [facility]) CM/SW Contact:    Tiburcio Bash, LCSW Phone Number: 01/14/2022, 3:27 PM  Clinical Narrative:                  CSW spoke with patient's daughter Alyse Low regarding dispo planning. She reports patient is active with a [place] agency but cannot remember the name, is interested in more information regarding home hospice services and is agreeable to talk with [facility] for informational call. Reports she will then decide which route to take with her brother.   CSW has made referral to [facility].    Expected Discharge Plan:  (TBD) Barriers to Discharge: Continued Medical Work up   Patient Goals and CMS Choice Patient states their goals for this hospitalization and ongoing recovery are:: to go home CMS Medicare.gov Compare Post Acute Care list provided to:: Patient Represenative (must comment) (daughter) Choice offered to / list presented to : Adult Children  Expected Discharge Plan and Services Expected Discharge Plan:  (TBD)       Living arrangements for the past 2 months: Single Family Home                                      Prior Living Arrangements/Services Living arrangements for the past 2 months: Single Family Home Lives with:: Relatives                   Activities of Daily Living Home Assistive Devices/Equipment: None ADL Screening (condition at time of admission) Patient's cognitive ability adequate to safely complete daily activities?: No Is the patient deaf or have difficulty hearing?: No Does the patient have difficulty seeing, even when wearing glasses/contacts?: Yes Does the patient have difficulty concentrating, remembering, or making decisions?: Yes Patient able to express need for assistance with ADLs?: Yes Does the patient have difficulty dressing or bathing?:  Yes Independently performs ADLs?: No Communication: Needs assistance Is this a change from baseline?: Pre-admission baseline Dressing (OT): Needs assistance Is this a change from baseline?: Pre-admission baseline Grooming: Needs assistance Is this a change from baseline?: Pre-admission baseline Feeding: Independent Bathing: Needs assistance Is this a change from baseline?: Pre-admission baseline Toileting: Needs assistance Is this a change from baseline?: Pre-admission baseline In/Out Bed: Needs assistance Is this a change from baseline?: Pre-admission baseline Walks in Home: Needs assistance Is this a change from baseline?: Pre-admission baseline Does the patient have difficulty walking or climbing stairs?: Yes Weakness of Legs: Both Weakness of Arms/Hands: None  Permission Sought/Granted                  Emotional Assessment              Admission diagnosis:  Hyponatremia [E87.1] SOB (shortness of breath) [R06.02] COPD exacerbation (HCC) [J44.1] Symptomatic anemia [D64.9] Chronic obstructive pulmonary disease, unspecified COPD type ([place]) [J44.9] Community acquired pneumonia, unspecified laterality [J18.9] Patient Active Problem List   Diagnosis Date Noted   COPD exacerbation ([place]) 01/13/2022   Normocytic anemia 01/13/2022   Transaminitis 01/13/2022   Atrial fibrillation with RVR ([place]) 01/13/2022   Pneumonia of left lower lobe due to infectious organism 11/23/2021   COVID-19 11/15/2021   Chronic respiratory failure with hypoxia ([place]) 09/21/2021   Acute on chronic respiratory failure with hypoxia ([place]) 09/11/2021  Valvular vegetation 05/06/2021   Malnutrition of moderate degree 04/29/2021   Unintentional weight loss 04/28/2021   Congestive heart failure ([place]) 03/15/2021   Cramps, extremity 05/29/2020   Aortic atherosclerosis ([place]) 05/27/2020   Hyponatremia 02/17/2020   Hypokalemia 02/17/2020   Nicotine dependence 02/17/2020   Postherpetic neuralgia  11/24/2019   Nicotine dependence, cigarettes, w unsp disorders 10/17/2017   IBS (irritable bowel syndrome) 04/04/2017   Advanced care planning/counseling discussion 10/03/2016   CAD (coronary artery disease) 10/03/2016   History of anemia 10/03/2016   Benign hypertension with chronic kidney disease 03/06/2015   Allergic rhinitis 09/03/2014   CKD (chronic kidney disease), 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   GERD (gastroesophageal reflux disease) 09/03/2014   Hyperlipidemia 09/03/2014   Chronic anemia 07/14/2014   COPD, severe ([place]) 08/08/2013   Pulmonary nodules 08/08/2013   PCP:  [name], DO Pharmacy:   [facility] [address] [place]) [place] [address] Phone: [phone] Fax: [phone]  [facility] [place], [place] [address] [facility]  [address] [place] [address] Phone: [phone] Fax: [phone]     Social Determinants of Health (SDOH) Interventions    Readmission Risk Interventions     No data to display

## 2022-01-15 DIAGNOSIS — J441 Chronic obstructive pulmonary disease with (acute) exacerbation: Secondary | ICD-10-CM | POA: Diagnosis not present

## 2022-01-15 LAB — CBC
HCT: 23.7 % — ABNORMAL LOW (ref 36.0–46.0)
Hemoglobin: 7.7 g/dL — ABNORMAL LOW (ref 12.0–15.0)
MCH: 28.2 pg (ref 26.0–34.0)
MCHC: 32.5 g/dL (ref 30.0–36.0)
MCV: 86.8 fL (ref 80.0–100.0)
Platelets: 343 10*3/uL (ref 150–400)
RBC: 2.73 MIL/uL — ABNORMAL LOW (ref 3.87–5.11)
RDW: 17.3 % — ABNORMAL HIGH (ref 11.5–15.5)
WBC: 8.3 10*3/uL (ref 4.0–10.5)
nRBC: 0 % (ref 0.0–0.2)

## 2022-01-15 LAB — OSMOLALITY, URINE: Osmolality, Ur: 426 mOsm/kg (ref 300–900)

## 2022-01-15 LAB — LIPID PANEL
Cholesterol: 100 mg/dL (ref 0–200)
HDL: 48 mg/dL (ref >40)
LDL Cholesterol: 41 mg/dL (ref 0–99)
Total CHOL/HDL Ratio: 2.1 RATIO
Triglycerides: 54 mg/dL (ref <150)
VLDL: 11 mg/dL (ref 0–40)

## 2022-01-15 LAB — OSMOLALITY: Osmolality: 303 mOsm/kg — ABNORMAL HIGH (ref 275–295)

## 2022-01-15 LAB — BASIC METABOLIC PANEL
Anion gap: 5 (ref 5–15)
BUN: 16 mg/dL (ref 8–23)
CO2: 29 mmol/L (ref 22–32)
Calcium: 8.8 mg/dL — ABNORMAL LOW (ref 8.9–10.3)
Chloride: 92 mmol/L — ABNORMAL LOW (ref 98–111)
Creatinine, Ser: 0.54 mg/dL (ref 0.44–1.00)
GFR, Estimated: >60 mL/min (ref >60)
Glucose, Bld: 97 mg/dL (ref 70–99)
Potassium: 4.3 mmol/L (ref 3.5–5.1)
Sodium: 126 mmol/L — ABNORMAL LOW (ref 135–145)

## 2022-01-15 LAB — SODIUM, URINE, RANDOM: Sodium, Ur: <10 mmol/L

## 2022-01-15 MED ORDER — IPRATROPIUM-ALBUTEROL 0.5-2.5 (3) MG/3ML IN SOLN
3.0000 mL | Freq: Three times a day (TID) | RESPIRATORY_TRACT | Status: DC
  Administered 2022-01-15 – 2022-01-19 (×13): 3 mL via RESPIRATORY_TRACT
  Filled 2022-01-15 (×12): qty 3

## 2022-01-15 MED ORDER — MIRTAZAPINE 15 MG PO TABS
7.5000 mg | ORAL_TABLET | Freq: Every day | ORAL | Status: DC
  Administered 2022-01-15 – 2022-01-20 (×6): 7.5 mg via ORAL
  Filled 2022-01-15 (×6): qty 1

## 2022-01-15 MED ORDER — SENNA 8.6 MG PO TABS
1.0000 | ORAL_TABLET | Freq: Every day | ORAL | Status: DC
  Administered 2022-01-15 – 2022-01-21 (×7): 8.6 mg via ORAL
  Filled 2022-01-15 (×7): qty 1

## 2022-01-15 MED ORDER — POLYETHYLENE GLYCOL 3350 17 G PO PACK
34.0000 g | PACK | Freq: Every day | ORAL | Status: DC
  Administered 2022-01-15 – 2022-01-18 (×4): 34 g via ORAL
  Filled 2022-01-15 (×7): qty 2

## 2022-01-15 NOTE — Evaluation (Addendum)
Physical Therapy Evaluation Patient Details Name: Kristi Clark MRN: [id number] DOB: [birth year] Today's Date: 01/15/2022  History of Present Illness  presented to ER secondary to progressive SOB; admitted for management of acute/chronic respiratory failure with hypoxia due to COPD exacerbation.  Clinical Impression  Patient resting in bed upon arrival to room; alert and oriented, follows commands and agreeable to participation with session as able.  Son at bedside, supportive and encouraging to patient throughout session.  Denies pain at this time; does endorse continued SOB (even without exertion), but hopeful for OOB to chair.  Generally weak and deconditioned throughout all extremities; no focal weakness appreciated.  Able to complete bed mobility with min assist; sit/stand, bed/chair transfer with L HHA, min assist.  Patient generally weak and unsteady; significant SOB with simple transfer with desat to 86% (recovers >90% within 45-60 seconds of seated rest, pursed lip breathing), BORG 10/10.  Additional gait/mobility progression deferred as result. Would benefit from skilled PT to address above deficits and promote optimal return to PLOF.; Recommend transition to HHPT upon discharge from acute hospitalization, pending goals of care (noted discussion of potential hospice at home).      Recommendations for follow up therapy are one component of a multi-disciplinary discharge planning process, led by the attending physician.  Recommendations may be updated based on patient status, additional functional criteria and insurance authorization.  Follow Up Recommendations Home health PT      Assistance Recommended at Discharge Frequent or constant Supervision/Assistance  Patient can return home with the following  A little help with walking and/or transfers;A little help with bathing/dressing/bathroom    Equipment Recommendations    Recommendations for Other Services       Functional  Status Assessment Patient has had a recent decline in their functional status and demonstrates the ability to make significant improvements in function in a reasonable and predictable amount of time.     Precautions / Restrictions Precautions Precautions: Fall Restrictions Weight Bearing Restrictions: No      Mobility  Bed Mobility Overal bed mobility: Needs Assistance Bed Mobility: Supine to Sit     Supine to sit: Min assist          Transfers Overall transfer level: Needs assistance Equipment used: 1 person hand held assist Transfers: Sit to/from Stand, Bed to chair/wheelchair/BSC Sit to Stand: Min assist Stand pivot transfers: Min assist         General transfer comment: generally weak and unsteady; significant SOB with simple transfer with desat to 86% (recovers >90% within 45-60 seconds of seated rest, pursed lip breathing), BORG 10/10.    Ambulation/Gait               General Gait Details: unsafe/unable to tolerate due to significant SOB, fatigue with bed/chair transfer  Stairs            Wheelchair Mobility    Modified Rankin (Stroke Patients Only)       Balance Overall balance assessment: Needs assistance Sitting-balance support: No upper extremity supported, Feet supported Sitting balance-Leahy Scale: Good     Standing balance support: Single extremity supported Standing balance-Leahy Scale: Fair                               Pertinent Vitals/Pain Pain Assessment Pain Assessment: No/denies pain    Home Living Family/patient expects to be discharged to:: Private residence Living Arrangements:  (grandson and neice) Available Help at Discharge: Family;Available PRN/intermittently  Type of Home: House Home Access: Ramped entrance       Home Layout: One level Home Equipment: Conservation officer, nature (2 wheels)      Prior Function Prior Level of Function : Independent/Modified Independent             Mobility Comments:  Baseline indep with ADLs, household and very limited community mobilization; endorses increased use of RW and increased assist from family in recent weeks.  Denies recent fall history; home O2 2-2.5L       Hand Dominance   Dominant Hand: Right    Extremity/Trunk Assessment   Upper Extremity Assessment Upper Extremity Assessment: Generalized weakness (grossly 4-/5 throughout)    Lower Extremity Assessment Lower Extremity Assessment: Generalized weakness (grossly 4-/5 throughout)       Communication   Communication: No difficulties  Cognition Arousal/Alertness: Awake/alert Behavior During Therapy: WFL for tasks assessed/performed Overall Cognitive Status: Within Functional Limits for tasks assessed                                          General Comments      Exercises     Assessment/Plan    PT Assessment Patient needs continued PT services  PT Problem List Decreased strength;Decreased activity tolerance;Decreased balance;Decreased mobility;Decreased cognition;Decreased knowledge of use of DME;Decreased safety awareness;Cardiopulmonary status limiting activity       PT Treatment Interventions DME instruction;Gait training;Functional mobility training;Therapeutic activities;Therapeutic exercise;Balance training;Patient/family education    PT Goals (Current goals can be found in the Care Plan section)  Acute Rehab PT Goals Patient Stated Goal: to get OOB more PT Goal Formulation: With patient/family Time For Goal Achievement: 01/29/22 Potential to Achieve Goals: Fair    Frequency Min 2X/week     Co-evaluation               AM-PAC PT "6 Clicks" Mobility  Outcome Measure Help needed turning from your back to your side while in a flat bed without using bedrails?: None Help needed moving from lying on your back to sitting on the side of a flat bed without using bedrails?: A Little Help needed moving to and from a bed to a chair (including a  wheelchair)?: A Little Help needed standing up from a chair using your arms (e.g., wheelchair or bedside chair)?: A Little Help needed to walk in hospital room?: A Little Help needed climbing 3-5 steps with a railing? : A Lot 6 Click Score: 18    End of Session Equipment Utilized During Treatment: Oxygen Activity Tolerance: Treatment limited secondary to medical complications (Comment) (significant SOB with minimal exertion) Patient left: in chair;with call bell/phone within reach;with family/visitor present (RN to connect alarm once family dissipates) Nurse Communication: Mobility status PT Visit Diagnosis: Difficulty in walking, not elsewhere classified (R26.2);Muscle weakness (generalized) (M62.81)    Time: 0254-2706 PT Time Calculation (min) (ACUTE ONLY): 13 min   Charges:   PT Evaluation $PT Eval Moderate Complexity: 1 Mod          Rebecca Motta H. Owens Shark, PT, DPT, NCS 01/15/22, 1:51 PM [phone]

## 2022-01-15 NOTE — Progress Notes (Signed)
PROGRESS NOTE    Kristi Clark  ZYS:[id number] DOB: 1950/04/17 DOA: 01/13/2022 PCP: [name], DO  Outpatient Specialists: cardiology ([place]), pulm ([place])    Brief Narrative:   From admission h and p  Kristi Clark is a 71 y.o. female with medical history significant of keratosis, basal cell carcinoma, COPD, depression, GERD, hyperlipidemia, hypertension, disease, moderate CAD treated medically who is coming to the emergency department with complaints of progressively worse acute on chronic dyspnea for the past 2 weeks associated with wheezing, occasional productive cough with yellowish sputum, pleuritic chest wall pain, fatigue and decreased appetite.  She has also continued to lose weight.  No fever, chills or night sweats. No sore throat, rhinorrhea, dyspnea, wheezing or hemoptysis.  No chest pain, palpitations, diaphoresis, PND, orthopnea, but occasionally gets pitting edema of the lower extremities.  No diarrhea, occasional constipation constipation, no melena or hematochezia.  No flank pain, dysuria, frequency or hematuria.  Has stress incontinence.  No polyuria, polydipsia, polyphagia or blurred vision.    Assessment & Plan:   Principal Problem:   COPD exacerbation ([place]) Active Problems:   Acute on chronic respiratory failure with hypoxia (HCC)   Hyponatremia   GERD (gastroesophageal reflux disease)   Hyperlipidemia   Benign hypertension with chronic kidney disease   CAD (coronary artery disease)   Aortic atherosclerosis (HCC)   Unintentional weight loss   Malnutrition of moderate degree   Normocytic anemia   Transaminitis   Atrial fibrillation with RVR (HCC)   # Acute on chronic hypoxic respiratory failure On 2-3 l at home, here more dyspneic than normal on 3 - [place] o2, wean as able  # COPD with acute exacerbation End-stage COPD, no PE on CTA. Per pulm note hx pseudomonas - cont steroids, bronchodilators - cont cefepime - urine antigens, sputum for  culture if able to produce, follow blood cultures  # End-of-life care - palliative consulted, interested in hospice  # Debility - PT consult pending  # A-fib New dx. Tsh wnl. Cards following - amio per cards, close outpt f/u - deferring anticoagulation for now given anemia  # CAD - hold plavix given anemia, continue aspirin and statin for now  # Anemia, normocytic Denies bleeding or melena. Does have dysphagia/odynophagia. Iron studies show some element of iron deficiency. 8s on arrival, 7.7 today - consider IV iron while here - monitor for now, would involve GI if down-trending  # Unintentional weight loss # dysphagia, odynophagia CT abdomen/pelvis, CT chest without acute process - PPI  # urinary retention Required I/o cath in ED and hospital day one but overnight has been able to void, not retaining - bladder scan q shift, io cath as needed  # Hyponatremia Acute on chronic, here 124>126, baseline is around 130. On hctz at home but patient says hasn't been taking. Suspect siadh. Tsh wnl - hold thiazide - urine osm, serum osm, urine sodium, am cortisol - cont fluid restriction - decrease mirtazapine  # Constipation May be contributing to urinary retention.      DVT prophylaxis: SCDs Code Status: dnr Family Communication: son updated @ bedside 11/11  Level of care: Progressive Status is: Inpatient Remains inpatient appropriate because: severity of illness    Consultants:  cardiology  Procedures: none  Antimicrobials:  Doxy>cefepime    Subjective: Feels run down, breathing stable  Objective: Vitals:   01/15/22 0500 01/15/22 0700 01/15/22 0755 01/15/22 0859  BP:  (!) 124/51    Pulse: 74 85 84   Resp:  19 (!) 33 (!) 21   Temp:  97.9 F (36.6 C)    TempSrc:  Oral    SpO2: 100% 97% 100% 98%  Weight:      Height:        Intake/Output Summary (Last 24 hours) at 01/15/2022 1105 Last data filed at 01/15/2022 0957 Gross per 24 hour  Intake 596.67  ml  Output 690 ml  Net -93.33 ml   Filed Weights   01/13/22 0347 01/14/22 0455  Weight: 41.3 kg 44.4 kg    Examination:  General exam: Appears chronically ill Respiratory system: Clear to auscultation. Respiratory effort normal. Cardiovascular system: S1 & S2 heard, RRR. No JVD, murmurs, rubs, gallops or clicks. No pedal edema. Gastrointestinal system: Abdomen is nondistended, soft and nontender. No organomegaly or masses felt. Normal bowel sounds heard. Central nervous system: Alert and oriented. No focal neurological deficits. Extremities: Symmetric 5 x 5 power. Skin: No rashes, lesions or ulcers Psychiatry: Judgement and insight appear normal. Mood & affect appropriate.     Data Reviewed: I have personally reviewed following labs and imaging studies  CBC: Recent Labs  Lab 01/13/22 0353 01/14/22 0411 01/15/22 0640  WBC 8.3 10.8*  10.5 8.3  NEUTROABS 6.5 8.4*  --   HGB 8.1* 8.2*  8.3* 7.7*  HCT 24.6* 24.9*  25.1* 23.7*  MCV 87.9 87.4  86.9 86.8  PLT 319 349  346 258   Basic Metabolic Panel: Recent Labs  Lab 01/13/22 0353 01/13/22 2226 01/14/22 0411 01/15/22 0640  NA 124* 125* 124* 126*  K 3.7  --  3.7 4.3  CL 90*  --  91* 92*  CO2 28  --  27 29  GLUCOSE 107*  --  114* 97  BUN 15  --  10 16  CREATININE 0.53  --  0.52 0.54  CALCIUM 8.6*  --  8.7* 8.8*  MG  --  1.6*  --   --   PHOS  --   --  2.7  --    GFR: Estimated Creatinine Clearance: 35 mL/min (by C-G formula based on SCr of 0.54 mg/dL). Liver Function Tests: Recent Labs  Lab 01/13/22 0353 01/14/22 0411  AST 51* 44*  ALT 52* 52*  ALKPHOS 96 97  BILITOT 0.6 0.5  PROT 6.0* 6.2*  ALBUMIN 2.6* 2.7*   Recent Labs  Lab 01/13/22 0353  LIPASE 67*   No results for input(s): "AMMONIA" in the last 168 hours. Coagulation Profile: No results for input(s): "INR", "PROTIME" in the last 168 hours. Cardiac Enzymes: No results for input(s): "CKTOTAL", "CKMB", "CKMBINDEX", "TROPONINI" in the last 168  hours. BNP (last 3 results) No results for input(s): "PROBNP" in the last 8760 hours. HbA1C: No results for input(s): "HGBA1C" in the last 72 hours. CBG: No results for input(s): "GLUCAP" in the last 168 hours. Lipid Profile: No results for input(s): "CHOL", "HDL", "LDLCALC", "TRIG", "CHOLHDL", "LDLDIRECT" in the last 72 hours. Thyroid Function Tests: Recent Labs    01/14/22 0411  TSH 0.482  FREET4 1.01   Anemia Panel: Recent Labs    01/14/22 0411  FERRITIN 44  TIBC 283  IRON 17*   Urine analysis:    Component Value Date/Time   COLORURINE YELLOW (A) 01/13/2022 0352   APPEARANCEUR CLEAR (A) 01/13/2022 0352   APPEARANCEUR Clear 12/27/2021 1316   LABSPEC 1.011 01/13/2022 0352   PHURINE 6.0 01/13/2022 [place] 01/13/2022 0352   HGBUR NEGATIVE 01/13/2022 [place] NEGATIVE 01/13/2022 0352  BILIRUBINUR Negative 12/27/2021 1316   KETONESUR 5 (A) 01/13/2022 0352   PROTEINUR 30 (A) 01/13/2022 0352   NITRITE NEGATIVE 01/13/2022 0352   LEUKOCYTESUR NEGATIVE 01/13/2022 0352   Sepsis Labs: '@LABRCNTIP'$ (procalcitonin:4,lacticidven:4)  ) Recent Results (from the past 240 hour(s))  Resp Panel by RT-PCR (Flu A&B, Covid) Anterior Nasal Swab     Status: None   Collection Time: 01/13/22  3:53 AM   Specimen: Anterior Nasal Swab  Result Value Ref Range Status   SARS Coronavirus 2 by RT PCR NEGATIVE NEGATIVE Final    Comment: (NOTE) SARS-CoV-2 target nucleic acids are NOT DETECTED.  The SARS-CoV-2 RNA is generally detectable in upper respiratory specimens during the acute phase of infection. The lowest concentration of SARS-CoV-2 viral copies this assay can detect is 138 copies/mL. A negative result does not preclude SARS-Cov-2 infection and should not be used as the sole basis for treatment or other patient management decisions. A negative result may occur with  improper specimen collection/handling, submission of specimen other than nasopharyngeal swab,  presence of viral mutation(s) within the areas targeted by this assay, and inadequate number of viral copies(<138 copies/mL). A negative result must be combined with clinical observations, patient history, and epidemiological information. The expected result is Negative.  Fact Sheet for Patients:  EntrepreneurPulse.com.au  Fact Sheet for Healthcare Providers:  IncredibleEmployment.be  This test is no t yet approved or cleared by the [place] FDA and  has been authorized for detection and/or diagnosis of SARS-CoV-2 by FDA under an Emergency Use Authorization (EUA). This EUA will remain  in effect (meaning this test can be used) for the duration of the COVID-19 declaration under Section 564(b)(1) of the Act, 21 U.S.C.section 360bbb-3(b)(1), unless the authorization is terminated  or revoked sooner.       Influenza A by PCR NEGATIVE NEGATIVE Final   Influenza B by PCR NEGATIVE NEGATIVE Final    Comment: (NOTE) The Xpert Xpress SARS-CoV-2/FLU/RSV plus assay is intended as an aid in the diagnosis of influenza from Nasopharyngeal swab specimens and should not be used as a sole basis for treatment. Nasal washings and aspirates are unacceptable for Xpert Xpress SARS-CoV-2/FLU/RSV testing.  Fact Sheet for Patients: EntrepreneurPulse.com.au  Fact Sheet for Healthcare Providers: IncredibleEmployment.be  This test is not yet approved or cleared by the [place] FDA and has been authorized for detection and/or diagnosis of SARS-CoV-2 by FDA under an Emergency Use Authorization (EUA). This EUA will remain in effect (meaning this test can be used) for the duration of the COVID-19 declaration under Section 564(b)(1) of the Act, 21 U.S.C. section 360bbb-3(b)(1), unless the authorization is terminated or revoked.  Performed at [facility], [address] [place], [place] [address]   Culture, blood  (routine x 2)     Status: None (Preliminary result)   Collection Time: 01/13/22  6:30 AM   Specimen: BLOOD  Result Value Ref Range Status   Specimen Description BLOOD LEFT FOREARM  Final   Special Requests   Final    BOTTLES DRAWN AEROBIC AND ANAEROBIC Blood Culture results may not be optimal due to an inadequate volume of blood received in culture bottles   Culture   Final    NO GROWTH 2 DAYS Performed at [facility], [address] [place], [place] [address]    Report Status PENDING  Incomplete  Culture, blood (routine x 2)     Status: None (Preliminary result)   Collection Time: 01/13/22  6:30 AM   Specimen: BLOOD  Result Value Ref Range Status   Specimen Description BLOOD RIGHT FOREARM  Final   Special Requests   Final    BOTTLES DRAWN AEROBIC AND ANAEROBIC Blood Culture adequate volume   Culture   Final    NO GROWTH 2 DAYS Performed at [facility], [address] [place], [place] [address]    Report Status PENDING  Incomplete  Respiratory (~20 pathogens) panel by PCR     Status: None   Collection Time: 01/14/22  2:05 AM   Specimen: Nasopharyngeal Swab; Respiratory  Result Value Ref Range Status   Adenovirus NOT DETECTED NOT DETECTED Final   Coronavirus 229E NOT DETECTED NOT DETECTED Final    Comment: (NOTE) The Coronavirus on the Respiratory Panel, DOES NOT test for the novel  Coronavirus (2019 nCoV)    Coronavirus HKU1 NOT DETECTED NOT DETECTED Final   Coronavirus NL63 NOT DETECTED NOT DETECTED Final   Coronavirus OC43 NOT DETECTED NOT DETECTED Final   Metapneumovirus NOT DETECTED NOT DETECTED Final   Rhinovirus / Enterovirus NOT DETECTED NOT DETECTED Final   Influenza A NOT DETECTED NOT DETECTED Final   Influenza B NOT DETECTED NOT DETECTED Final   Parainfluenza Virus 1 NOT DETECTED NOT DETECTED Final   Parainfluenza Virus 2 NOT DETECTED NOT DETECTED Final   Parainfluenza Virus 3 NOT DETECTED NOT DETECTED Final   Parainfluenza Virus 4 NOT  DETECTED NOT DETECTED Final   Respiratory Syncytial Virus NOT DETECTED NOT DETECTED Final   Bordetella pertussis NOT DETECTED NOT DETECTED Final   Bordetella Parapertussis NOT DETECTED NOT DETECTED Final   Chlamydophila pneumoniae NOT DETECTED NOT DETECTED Final   Mycoplasma pneumoniae NOT DETECTED NOT DETECTED Final    Comment: Performed at [facility], [place]. [address], [place], [place] [address]         Radiology Studies: CT ABDOMEN PELVIS W CONTRAST  Result Date: 01/14/2022 CLINICAL DATA:  Dysphagia.  Unintended weight loss. EXAM: CT ABDOMEN AND PELVIS WITH CONTRAST TECHNIQUE: Multidetector CT imaging of the abdomen and pelvis was performed using the standard protocol following bolus administration of intravenous contrast. RADIATION DOSE REDUCTION: This exam was performed according to the departmental dose-optimization program which includes automated exposure control, adjustment of the mA and/or kV according to patient size and/or use of iterative reconstruction technique. CONTRAST:  180m OMNIPAQUE IOHEXOL 300 MG/ML  SOLN COMPARISON:  None Available. FINDINGS: Lower chest: Bilateral pleural effusions. Severe interstitial lung changes. Minimal pericardial effusion. Hepatobiliary: No focal liver abnormality is seen. Status post cholecystectomy. No biliary dilatation. Pancreas: Unremarkable. No pancreatic ductal dilatation or surrounding inflammatory changes. Spleen: Normal in size without focal abnormality. Adrenals/Urinary Tract: Adrenal glands are unremarkable. Kidneys are normal, without renal calculi, focal lesion, or hydronephrosis. Bladder is unremarkable. Stomach/Bowel: Stomach is within normal limits. Appendix appears normal. No evidence of bowel wall thickening, distention, or inflammatory changes. Vascular/Lymphatic: Aortic atherosclerosis. No enlarged abdominal or pelvic lymph nodes. Reproductive: Status post hysterectomy. No adnexal masses. Other: No abdominal wall hernia or  abnormality. No abdominopelvic ascites. Diffuse subcutaneous edema. Musculoskeletal: No acute or significant osseous findings. IMPRESSION: 1. No acute abnormalities within the abdomen or pelvis. 2. Bilateral pleural effusions with severe interstitial lung changes. 3. Minimal pericardial effusion. 4. Diffuse subcutaneous edema. 5. Aortic atherosclerosis. Aortic Atherosclerosis (ICD10-I70.0). Electronically Signed   By: DFidela SalisburyM.D.   On: 01/14/2022 12:18   CT Angio Chest Pulmonary Embolism (PE) W or WO Contrast  Result Date: 01/14/2022 CLINICAL DATA:  Shortness of breath with productive cough EXAM: CT  ANGIOGRAPHY CHEST WITH CONTRAST TECHNIQUE: Multidetector CT imaging of the chest was performed using the standard protocol during bolus administration of intravenous contrast. Multiplanar CT image reconstructions and MIPs were obtained to evaluate the vascular anatomy. RADIATION DOSE REDUCTION: This exam was performed according to the departmental dose-optimization program which includes automated exposure control, adjustment of the mA and/or kV according to patient size and/or use of iterative reconstruction technique. CONTRAST:  79m OMNIPAQUE IOHEXOL 350 MG/ML SOLN COMPARISON:  Radiograph 01/13/2022 and CT chest angiogram 09/11/2021 FINDINGS: Cardiovascular: Satisfactory opacification of the pulmonary arteries to the segmental level. No evidence of pulmonary embolism. Cardiomegaly. No pericardial effusion. Advanced coronary artery and aortic atherosclerotic calcification. Locule of gas anterior to the right thyroid lobe likely due to intravenous injection. Reflux of contrast into the hepatic veins can be seen with elevated right heart pressures. Mediastinum/Nodes: No enlarged mediastinal, hilar, or axillary lymph nodes. Thyroid gland, trachea, and esophagus demonstrate no significant findings. Lungs/Pleura: Diffuse emphysematous change. Diffuse bronchial wall thickening. Peripheral predominant  reticular opacities greatest in the left lower lung. Interlobular septal thickening is diffuse throughout both lungs but greatest in the left lower lobe. Patchy ground-glass and nodular opacities greatest in the left lower lobe. Small left-greater-than-right pleural effusions and associated atelectasis. These findings are new or increased since 09/11/2021. Upper Abdomen: Calcified plaque at the origin of the celiac artery and SMA cause advanced narrowing. No acute abnormality. Musculoskeletal: No chest wall abnormality. No acute osseous findings. Review of the MIP images confirms the above findings. IMPRESSION: Negative for acute pulmonary embolism. Advanced emphysema/interstitial lung disease with superimposed interstitial pneumonia and/or pulmonary edema. Small bilateral pleural effusions. Cardiomegaly and advanced coronary artery atherosclerotic calcification. Reflux of contrast into the hepatic veins can be seen with elevated right heart pressures. Aortic Atherosclerosis (ICD10-I70.0) and Emphysema (ICD10-J43.9). 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 Electronically Signed   By: TPlacido SouM.D.   On: 01/14/2022 00:34        Scheduled Meds:  amiodarone  400 mg Oral BID   aspirin EC  81 mg Oral Daily   atorvastatin  40 mg Oral Daily   feeding supplement  237 mL Oral BID BM   ipratropium-albuterol  3 mL Nebulization TID   isosorbide mononitrate  60 mg Oral Daily   loratadine  10 mg Oral Daily   methylPREDNISolone (SOLU-MEDROL) injection  40 mg Intravenous Q24H   metoprolol succinate  50 mg Oral Daily   mirtazapine  30 mg Oral QHS   multivitamin with minerals  1 tablet Oral Daily   nicotine  14 mg Transdermal Daily   pantoprazole (PROTONIX) IV  40 mg Intravenous Q12H   Continuous Infusions:  ceFEPime (MAXIPIME) IV 2 g (01/15/22 0939)     LOS: 2 days     NDesma Maxim MD [facility]   If 7PM-7AM, please contact  night-coverage [email or web address] Password [email or web address] 01/15/2022, 11:05 AM

## 2022-01-16 DIAGNOSIS — J441 Chronic obstructive pulmonary disease with (acute) exacerbation: Secondary | ICD-10-CM | POA: Diagnosis not present

## 2022-01-16 LAB — EXPECTORATED SPUTUM ASSESSMENT W GRAM STAIN, RFLX TO RESP C

## 2022-01-16 LAB — BASIC METABOLIC PANEL
Anion gap: 3 — ABNORMAL LOW (ref 5–15)
BUN: 20 mg/dL (ref 8–23)
CO2: 29 mmol/L (ref 22–32)
Calcium: 8.1 mg/dL — ABNORMAL LOW (ref 8.9–10.3)
Chloride: 95 mmol/L — ABNORMAL LOW (ref 98–111)
Creatinine, Ser: 0.53 mg/dL (ref 0.44–1.00)
GFR, Estimated: >60 mL/min (ref >60)
Glucose, Bld: 123 mg/dL — ABNORMAL HIGH (ref 70–99)
Potassium: 4.1 mmol/L (ref 3.5–5.1)
Sodium: 127 mmol/L — ABNORMAL LOW (ref 135–145)

## 2022-01-16 LAB — CORTISOL-AM, BLOOD: Cortisol - AM: 7.1 ug/dL (ref 6.7–22.6)

## 2022-01-16 LAB — OSMOLALITY: Osmolality: 277 mOsm/kg (ref 275–295)

## 2022-01-16 LAB — CBC
HCT: 22.3 % — ABNORMAL LOW (ref 36.0–46.0)
Hemoglobin: 7.2 g/dL — ABNORMAL LOW (ref 12.0–15.0)
MCH: 28.2 pg (ref 26.0–34.0)
MCHC: 32.3 g/dL (ref 30.0–36.0)
MCV: 87.5 fL (ref 80.0–100.0)
Platelets: 378 10*3/uL (ref 150–400)
RBC: 2.55 MIL/uL — ABNORMAL LOW (ref 3.87–5.11)
RDW: 17.3 % — ABNORMAL HIGH (ref 11.5–15.5)
WBC: 6.8 10*3/uL (ref 4.0–10.5)
nRBC: 0 % (ref 0.0–0.2)

## 2022-01-16 MED ORDER — LOSARTAN POTASSIUM 25 MG PO TABS
25.0000 mg | ORAL_TABLET | Freq: Every day | ORAL | Status: DC
  Administered 2022-01-16 – 2022-01-18 (×3): 25 mg via ORAL
  Filled 2022-01-16 (×3): qty 1

## 2022-01-16 MED ORDER — HYDRALAZINE HCL 25 MG PO TABS
12.5000 mg | ORAL_TABLET | Freq: Two times a day (BID) | ORAL | Status: DC
  Administered 2022-01-16 – 2022-01-21 (×11): 12.5 mg via ORAL
  Filled 2022-01-16 (×11): qty 1

## 2022-01-16 MED ORDER — PREDNISONE 20 MG PO TABS
40.0000 mg | ORAL_TABLET | Freq: Every day | ORAL | Status: DC
  Administered 2022-01-17 – 2022-01-21 (×5): 40 mg via ORAL
  Filled 2022-01-16 (×5): qty 2

## 2022-01-16 MED ORDER — SODIUM CHLORIDE 0.9 % IV BOLUS
1000.0000 mL | Freq: Once | INTRAVENOUS | Status: AC
  Administered 2022-01-16: 1000 mL via INTRAVENOUS

## 2022-01-16 MED ORDER — COSYNTROPIN 0.25 MG IJ SOLR
0.2500 mg | Freq: Once | INTRAMUSCULAR | Status: AC
  Administered 2022-01-17: 0.25 mg via INTRAVENOUS
  Filled 2022-01-16: qty 0.25

## 2022-01-16 NOTE — Plan of Care (Signed)
  Problem: Education: Goal: Knowledge of disease or condition will improve Outcome: Adequate for Discharge Goal: Knowledge of the prescribed therapeutic regimen will improve Outcome: Adequate for Discharge Goal: Individualized Educational Video(s) Outcome: Adequate for Discharge   Problem: Respiratory: Goal: Ability to maintain a clear airway will improve Outcome: Adequate for Discharge Goal: Levels of oxygenation will improve Outcome: Adequate for Discharge Goal: Ability to maintain adequate ventilation will improve Outcome: Adequate for Discharge

## 2022-01-16 NOTE — Progress Notes (Signed)
PROGRESS NOTE    Kristi Clark  OJJ:[id number] DOB: 1950-11-19 DOA: 01/13/2022 PCP: [name], DO  Outpatient Specialists: cardiology ([place]), pulm ([place])    Brief Narrative:   From admission h and p  Kristi Clark is a 71 y.o. female with medical history significant of keratosis, basal cell carcinoma, COPD, depression, GERD, hyperlipidemia, hypertension, disease, moderate CAD treated medically who is coming to the emergency department with complaints of progressively worse acute on chronic dyspnea for the past 2 weeks associated with wheezing, occasional productive cough with yellowish sputum, pleuritic chest wall pain, fatigue and decreased appetite.  She has also continued to lose weight.  No fever, chills or night sweats. No sore throat, rhinorrhea, dyspnea, wheezing or hemoptysis.  No chest pain, palpitations, diaphoresis, PND, orthopnea, but occasionally gets pitting edema of the lower extremities.  No diarrhea, occasional constipation constipation, no melena or hematochezia.  No flank pain, dysuria, frequency or hematuria.  Has stress incontinence.  No polyuria, polydipsia, polyphagia or blurred vision.    Assessment & Plan:   Principal Problem:   COPD exacerbation ([place]) Active Problems:   Acute on chronic respiratory failure with hypoxia (HCC)   Hyponatremia   GERD (gastroesophageal reflux disease)   Hyperlipidemia   Benign hypertension with chronic kidney disease   CAD (coronary artery disease)   Aortic atherosclerosis (HCC)   Unintentional weight loss   Malnutrition of moderate degree   Normocytic anemia   Transaminitis   Atrial fibrillation with RVR (HCC)   # Acute on chronic hypoxic respiratory failure On 2-3 l at home, here more dyspneic than normal, that has resolved and now is breathing comfortably on 3 L - [place] o2, patient interested in humidified o2 at discharge, will query TOC  # COPD with acute exacerbation End-stage COPD, no PE on CTA. Per  pulm note hx pseudomonas. Respiratory pathogen panel neg, covid neg - cont steroids, bronchodilators - cont cefepime - urine antigens, sputum for culture if able to produce, follow blood cultures  # End-of-life care - palliative consulted, interested in hospice  # Debility - PT advising [facility]  # A-fib New dx. Tsh wnl. Cards following - amio per cards, close outpt f/u - deferring anticoagulation for now given anemia  # CAD - hold plavix given anemia, given down-trending hgb will also hold aspirin  # Anemia, normocytic Denies bleeding or melena. Does have dysphagia/odynophagia. Iron studies show some element of iron deficiency. 8s on arrival, 7.2 today, was in the 11s 6 months ago - aspirin/plavix held - gi consulted, npo after midnight, plan for possible EGD tomorrow - continue ppi bid  # Unintentional weight loss # dysphagia, odynophagia CT abdomen/pelvis, CT chest without acute process. EGD 2019 with erosive gastritis - PPI - GI consulted  # urinary retention Required I/o cath in ED and hospital day one but overnight has been able to void, not retaining. Now urinating better - bladder scan q shift, io cath as needed  # Hyponatremia Acute on chronic, here 124>126, baseline is around 130. On hctz at home but patient says hasn't been taking. Suspect siadh. Tsh wnl. Am cortisol this morning low normal. Urine sodium also low so may be somewhat dehydrated. Serum osms wnl, not jaundiced or lipemic - hold thiazide - cosyntropin stim tomorrow - trial 1 L NS bolus - decreased dose mirtazapine  # Constipation May be contributing to urinary retention.  - cont miralax, senna  # HTN BP elevated today - resume home losartan and hydralazine  DVT prophylaxis: SCDs Code Status: dnr Family Communication: son updated @ bedside 11/12  Level of care: Progressive Status is: Inpatient Remains inpatient appropriate because: severity of illness    Consultants:   cardiology  Procedures: none  Antimicrobials:  Doxy>cefepime    Subjective: Feels improved, breathing stable  Objective: Vitals:   01/16/22 0035 01/16/22 0452 01/16/22 0751 01/16/22 0804  BP:    (!) 161/60  Pulse:    77  Resp:    18  Temp: 97.9 F (36.6 C) 98.1 F (36.7 C)  98.1 F (36.7 C)  TempSrc: Oral Oral  Oral  SpO2:   99% 99%  Weight:      Height:        Intake/Output Summary (Last 24 hours) at 01/16/2022 1207 Last data filed at 01/16/2022 1055 Gross per 24 hour  Intake 540 ml  Output 700 ml  Net -160 ml   Filed Weights   01/13/22 0347 01/14/22 0455  Weight: 41.3 kg 44.4 kg    Examination:  General exam: Appears chronically ill Respiratory system: Clear to auscultation. Respiratory effort normal. Cardiovascular system: S1 & S2 heard, RRR. No JVD, murmurs, rubs, gallops or clicks. No pedal edema. Gastrointestinal system: Abdomen is nondistended, soft and nontender. No organomegaly or masses felt. Normal bowel sounds heard. Central nervous system: Alert and oriented. No focal neurological deficits. Extremities: Symmetric 5 x 5 power. Skin: No rashes, lesions or ulcers Psychiatry: Judgement and insight appear normal. Mood & affect appropriate.     Data Reviewed: I have personally reviewed following labs and imaging studies  CBC: Recent Labs  Lab 01/13/22 0353 01/14/22 0411 01/15/22 0640 01/16/22 0458  WBC 8.3 10.8*  10.5 8.3 6.8  NEUTROABS 6.5 8.4*  --   --   HGB 8.1* 8.2*  8.3* 7.7* 7.2*  HCT 24.6* 24.9*  25.1* 23.7* 22.3*  MCV 87.9 87.4  86.9 86.8 87.5  PLT 319 349  346 343 093   Basic Metabolic Panel: Recent Labs  Lab 01/13/22 0353 01/13/22 2226 01/14/22 0411 01/15/22 0640 01/16/22 0458  NA 124* 125* 124* 126* 127*  K 3.7  --  3.7 4.3 4.1  CL 90*  --  91* 92* 95*  CO2 28  --  '27 29 29  '$ GLUCOSE 107*  --  114* 97 123*  BUN 15  --  '10 16 20  '$ CREATININE 0.53  --  0.52 0.54 0.53  CALCIUM 8.6*  --  8.7* 8.8* 8.1*  MG  --   1.6*  --   --   --   PHOS  --   --  2.7  --   --    GFR: Estimated Creatinine Clearance: 35 mL/min (by C-G formula based on SCr of 0.53 mg/dL). Liver Function Tests: Recent Labs  Lab 01/13/22 0353 01/14/22 0411  AST 51* 44*  ALT 52* 52*  ALKPHOS 96 97  BILITOT 0.6 0.5  PROT 6.0* 6.2*  ALBUMIN 2.6* 2.7*   Recent Labs  Lab 01/13/22 0353  LIPASE 67*   No results for input(s): "AMMONIA" in the last 168 hours. Coagulation Profile: No results for input(s): "INR", "PROTIME" in the last 168 hours. Cardiac Enzymes: No results for input(s): "CKTOTAL", "CKMB", "CKMBINDEX", "TROPONINI" in the last 168 hours. BNP (last 3 results) No results for input(s): "PROBNP" in the last 8760 hours. HbA1C: No results for input(s): "HGBA1C" in the last 72 hours. CBG: No results for input(s): "GLUCAP" in the last 168 hours. Lipid Profile: Recent Labs    01/15/22  0640  CHOL 100  HDL 48  LDLCALC 41  TRIG 54  CHOLHDL 2.1   Thyroid Function Tests: Recent Labs    01/14/22 0411  TSH 0.482  FREET4 1.01   Anemia Panel: Recent Labs    01/14/22 0411  FERRITIN 44  TIBC 283  IRON 17*   Urine analysis:    Component Value Date/Time   COLORURINE YELLOW (A) 01/13/2022 0352   APPEARANCEUR CLEAR (A) 01/13/2022 0352   APPEARANCEUR Clear 12/27/2021 1316   LABSPEC 1.011 01/13/2022 0352   PHURINE 6.0 01/13/2022 0352   GLUCOSEU NEGATIVE 01/13/2022 0352   HGBUR NEGATIVE 01/13/2022 0352   BILIRUBINUR NEGATIVE 01/13/2022 0352   BILIRUBINUR Negative 12/27/2021 1316   KETONESUR 5 (A) 01/13/2022 0352   PROTEINUR 30 (A) 01/13/2022 0352   NITRITE NEGATIVE 01/13/2022 0352   LEUKOCYTESUR NEGATIVE 01/13/2022 0352   Sepsis Labs: '@LABRCNTIP'$ (procalcitonin:4,lacticidven:4)  ) Recent Results (from the past 240 hour(s))  Resp Panel by RT-PCR (Flu A&B, Covid) Anterior Nasal Swab     Status: None   Collection Time: 01/13/22  3:53 AM   Specimen: Anterior Nasal Swab  Result Value Ref Range Status   SARS  Coronavirus 2 by RT PCR NEGATIVE NEGATIVE Final    Comment: (NOTE) SARS-CoV-2 target nucleic acids are NOT DETECTED.  The SARS-CoV-2 RNA is generally detectable in upper respiratory specimens during the acute phase of infection. The lowest concentration of SARS-CoV-2 viral copies this assay can detect is 138 copies/mL. A negative result does not preclude SARS-Cov-2 infection and should not be used as the sole basis for treatment or other patient management decisions. A negative result may occur with  improper specimen collection/handling, submission of specimen other than nasopharyngeal swab, presence of viral mutation(s) within the areas targeted by this assay, and inadequate number of viral copies(<138 copies/mL). A negative result must be combined with clinical observations, patient history, and epidemiological information. The expected result is Negative.  Fact Sheet for Patients:  EntrepreneurPulse.com.au  Fact Sheet for Healthcare Providers:  IncredibleEmployment.be  This test is no t yet approved or cleared by the [place] FDA and  has been authorized for detection and/or diagnosis of SARS-CoV-2 by FDA under an Emergency Use Authorization (EUA). This EUA will remain  in effect (meaning this test can be used) for the duration of the COVID-19 declaration under Section 564(b)(1) of the Act, 21 U.S.C.section 360bbb-3(b)(1), unless the authorization is terminated  or revoked sooner.       Influenza A by PCR NEGATIVE NEGATIVE Final   Influenza B by PCR NEGATIVE NEGATIVE Final    Comment: (NOTE) The Xpert Xpress SARS-CoV-2/FLU/RSV plus assay is intended as an aid in the diagnosis of influenza from Nasopharyngeal swab specimens and should not be used as a sole basis for treatment. Nasal washings and aspirates are unacceptable for Xpert Xpress SARS-CoV-2/FLU/RSV testing.  Fact Sheet for  Patients: EntrepreneurPulse.com.au  Fact Sheet for Healthcare Providers: IncredibleEmployment.be  This test is not yet approved or cleared by the [place] FDA and has been authorized for detection and/or diagnosis of SARS-CoV-2 by FDA under an Emergency Use Authorization (EUA). This EUA will remain in effect (meaning this test can be used) for the duration of the COVID-19 declaration under Section 564(b)(1) of the Act, 21 U.S.C. section 360bbb-3(b)(1), unless the authorization is terminated or revoked.  Performed at [facility], [address] [place], [place] [address]   Culture, blood (routine x 2)     Status: None (Preliminary result)   Collection Time:  01/13/22  6:30 AM   Specimen: BLOOD  Result Value Ref Range Status   Specimen Description BLOOD LEFT FOREARM  Final   Special Requests   Final    BOTTLES DRAWN AEROBIC AND ANAEROBIC Blood Culture results may not be optimal due to an inadequate volume of blood received in culture bottles   Culture   Final    NO GROWTH 3 DAYS Performed at [facility], [address], [place], [place] [address]    Report Status PENDING  Incomplete  Culture, blood (routine x 2)     Status: None (Preliminary result)   Collection Time: 01/13/22  6:30 AM   Specimen: BLOOD  Result Value Ref Range Status   Specimen Description BLOOD RIGHT FOREARM  Final   Special Requests   Final    BOTTLES DRAWN AEROBIC AND ANAEROBIC Blood Culture adequate volume   Culture   Final    NO GROWTH 3 DAYS Performed at [facility], [address] [place], [place] [address]    Report Status PENDING  Incomplete  Respiratory (~20 pathogens) panel by PCR     Status: None   Collection Time: 01/14/22  2:05 AM   Specimen: Nasopharyngeal Swab; Respiratory  Result Value Ref Range Status   Adenovirus NOT DETECTED NOT DETECTED Final   Coronavirus 229E NOT DETECTED NOT DETECTED Final    Comment:  (NOTE) The Coronavirus on the Respiratory Panel, DOES NOT test for the novel  Coronavirus (2019 nCoV)    Coronavirus HKU1 NOT DETECTED NOT DETECTED Final   Coronavirus NL63 NOT DETECTED NOT DETECTED Final   Coronavirus OC43 NOT DETECTED NOT DETECTED Final   Metapneumovirus NOT DETECTED NOT DETECTED Final   Rhinovirus / Enterovirus NOT DETECTED NOT DETECTED Final   Influenza A NOT DETECTED NOT DETECTED Final   Influenza B NOT DETECTED NOT DETECTED Final   Parainfluenza Virus 1 NOT DETECTED NOT DETECTED Final   Parainfluenza Virus 2 NOT DETECTED NOT DETECTED Final   Parainfluenza Virus 3 NOT DETECTED NOT DETECTED Final   Parainfluenza Virus 4 NOT DETECTED NOT DETECTED Final   Respiratory Syncytial Virus NOT DETECTED NOT DETECTED Final   Bordetella pertussis NOT DETECTED NOT DETECTED Final   Bordetella Parapertussis NOT DETECTED NOT DETECTED Final   Chlamydophila pneumoniae NOT DETECTED NOT DETECTED Final   Mycoplasma pneumoniae NOT DETECTED NOT DETECTED Final    Comment: Performed at [facility], [place] [address], [place], [place] [address]         Radiology Studies: No results found.      Scheduled Meds:  amiodarone  400 mg Oral BID   aspirin EC  81 mg Oral Daily   atorvastatin  40 mg Oral Daily   feeding supplement  237 mL Oral BID BM   ipratropium-albuterol  3 mL Nebulization TID   isosorbide mononitrate  60 mg Oral Daily   loratadine  10 mg Oral Daily   methylPREDNISolone (SOLU-MEDROL) injection  40 mg Intravenous Q24H   metoprolol succinate  50 mg Oral Daily   mirtazapine  7.5 mg Oral QHS   multivitamin with minerals  1 tablet Oral Daily   nicotine  14 mg Transdermal Daily   pantoprazole (PROTONIX) IV  40 mg Intravenous Q12H   polyethylene glycol  34 g Oral Daily   senna  1 tablet Oral Daily   Continuous Infusions:  ceFEPime (MAXIPIME) IV 2 g (01/16/22 0924)     LOS: 3 days     Desma Maxim, MD [facility]   If 7PM-7AM, please contact  night-coverage [email or web address] Password [facility] 01/16/2022, 12:07 PM

## 2022-01-16 NOTE — TOC Progression Note (Signed)
Transition of Care [facility]) - Progression Note    Patient Details  Name: FLOR WHITACRE MRN: [id number] Date of Birth: [birth year]  Transition of Care [facility]) CM/SW Contact  Valente David, RN Phone Number: 01/16/2022, 3:23 PM  Clinical Narrative:     Spoke with daughter Alyse Low, she and her brother remain in discussion regarding patient's disposition (home with home health versus home with hospice).  They will have more information tomorrow.  Aware that MD has requested for home O2 to be humidified, Mardene Celeste with [employer] aware.  MD has ordered humidifier for home O2, [employer] will work with family to deliver.  Expected Discharge Plan:  (TBD) Barriers to Discharge: Continued Medical Work up  Expected Discharge Plan and Services Expected Discharge Plan:  (TBD)       Living arrangements for the past 2 months: Single Family Home                                       Social Determinants of Health (SDOH) Interventions    Readmission Risk Interventions     No data to display

## 2022-01-17 ENCOUNTER — Encounter
Admission: EM | Disposition: A | Discharge status: Home Health | Payer: Self-pay | Source: Home / Self Care | Attending: Obstetrics and Gynecology

## 2022-01-17 ENCOUNTER — Inpatient Hospital Stay: Payer: Medicare Other

## 2022-01-17 ENCOUNTER — Encounter: Payer: Self-pay | Admitting: Certified Registered Nurse Anesthetist

## 2022-01-17 DIAGNOSIS — Z7189 Other specified counseling: Secondary | ICD-10-CM

## 2022-01-17 LAB — ACTH STIMULATION, 3 TIME POINTS
Cortisol, 30 Min: 17.1 ug/dL
Cortisol, 60 Min: 23.8 ug/dL
Cortisol, Base: 3.1 ug/dL

## 2022-01-17 LAB — BASIC METABOLIC PANEL
Anion gap: 5 (ref 5–15)
BUN: 20 mg/dL (ref 8–23)
CO2: 30 mmol/L (ref 22–32)
Calcium: 8.8 mg/dL — ABNORMAL LOW (ref 8.9–10.3)
Chloride: 94 mmol/L — ABNORMAL LOW (ref 98–111)
Creatinine, Ser: 0.5 mg/dL (ref 0.44–1.00)
GFR, Estimated: >60 mL/min (ref >60)
Glucose, Bld: 102 mg/dL — ABNORMAL HIGH (ref 70–99)
Potassium: 4.5 mmol/L (ref 3.5–5.1)
Sodium: 129 mmol/L — ABNORMAL LOW (ref 135–145)

## 2022-01-17 LAB — CBC
HCT: 21.7 % — ABNORMAL LOW (ref 36.0–46.0)
Hemoglobin: 7.1 g/dL — ABNORMAL LOW (ref 12.0–15.0)
MCH: 29 pg (ref 26.0–34.0)
MCHC: 32.7 g/dL (ref 30.0–36.0)
MCV: 88.6 fL (ref 80.0–100.0)
Platelets: 343 10*3/uL (ref 150–400)
RBC: 2.45 MIL/uL — ABNORMAL LOW (ref 3.87–5.11)
RDW: 17.7 % — ABNORMAL HIGH (ref 11.5–15.5)
WBC: 7.1 10*3/uL (ref 4.0–10.5)
nRBC: 0 % (ref 0.0–0.2)

## 2022-01-17 LAB — ECHOCARDIOGRAM COMPLETE
AR max vel: 1.27 cm2
AV Area VTI: 1.21 cm2
AV Area mean vel: 1.24 cm2
AV Mean grad: 3 mmHg
AV Peak grad: 4.7 mmHg
Ao pk vel: 1.08 m/s
Area-P 1/2: 5.09 cm2
Height: 53 in
S' Lateral: 2 cm
Weight: 1566.15 oz

## 2022-01-17 LAB — OSMOLALITY, URINE: Osmolality, Ur: 498 mOsm/kg (ref 300–900)

## 2022-01-17 LAB — LEGIONELLA PNEUMOPHILA SEROGP 1 UR AG: L. pneumophila Serogp 1 Ur Ag: NEGATIVE

## 2022-01-17 LAB — SODIUM, URINE, RANDOM: Sodium, Ur: 52 mmol/L

## 2022-01-17 SURGERY — ESOPHAGOGASTRODUODENOSCOPY (EGD) WITH PROPOFOL
Anesthesia: Monitor Anesthesia Care

## 2022-01-17 NOTE — TOC Progression Note (Signed)
Transition of Care [facility]) - Progression Note    Patient Details  Name: VANCE BELCOURT MRN: [id number] Date of Birth: 1950/03/15  Transition of Care [facility]) CM/SW [place], LCSW Phone Number: 01/17/2022, 3:10 PM  Clinical Narrative:    Verda Cumins, is able to accept pt for PT/OT. Referral given to [facility] for OP Palliative Sr[facility].  DME Hospital bed ordered via [employer].     Expected Discharge Plan:  (TBD) Barriers to Discharge: Continued Medical Work up  Expected Discharge Plan and Services Expected Discharge Plan:  (TBD)       Living arrangements for the past 2 months: Single Family Home                                       Social Determinants of Health (SDOH) Interventions    Readmission Risk Interventions     No data to display

## 2022-01-17 NOTE — Progress Notes (Signed)
    Durable Medical Equipment  (From admission, onward)           Start     Ordered   01/17/22 1103  For home use only DME Hospital bed  Once       Question Answer Comment  Length of Need Lifetime   Bed type Semi-electric      01/17/22 1102   01/16/22 1400  For home use only DME oxygen  Once       Comments: humidified  Question Answer Comment  Length of Need Lifetime   Mode or (Route) Nasal cannula   Liters per Minute 3   Oxygen delivery system Other see comments      01/16/22 1400           Patient has CHF/COPD/Gerd/CAD which requires head to be positioned in ways not feasible with a normal bed. Head must be elevated at least 45 degrees or frequent repositioning which cannot be achieved with a normal bed.    [place], [place]

## 2022-01-17 NOTE — Progress Notes (Signed)
SUBJECTIVE: Kristi Clark is a 71 y.o. female brought to the ED via EMS from home on 01/13/22 with a chief complaint of shortness of breath.  Patient with a history of COPD on 2 L continuous oxygen who has had recurrent community-acquired pneumonia over the past several weeks requiring several rounds of antibiotics and prednisone.  Seen by her PCP the day prior and prescribed another course of doxycycline.  Also endorses unintentional weight loss over the past several months.  Reports shortness of breath with productive cough of yellow sputum.  Denies fever/chills, chest pain, abdominal pain, nausea, vomiting or dizziness.    Patient was found to have new onset atrial fibrillation. Was started on amiodarone. Eliquis was held due to anemia.    Intake/Output Summary (Last 24 hours) at 01/17/2022 0902 Last data filed at 01/17/2022 0130 Gross per 24 hour  Intake 442.82 ml  Output 700 ml  Net -257.18 ml    LABS: Basic Metabolic Panel: Recent Labs    01/16/22 0458 01/17/22 0416  NA 127* 129*  K 4.1 4.5  CL 95* 94*  CO2 29 30  GLUCOSE 123* 102*  BUN 20 20  CREATININE 0.53 0.50  CALCIUM 8.1* 8.8*   Liver Function Tests: No results for input(s): "AST", "ALT", "ALKPHOS", "BILITOT", "PROT", "ALBUMIN" in the last 72 hours. No results for input(s): "LIPASE", "AMYLASE" in the last 72 hours. CBC: Recent Labs    01/16/22 0458 01/17/22 0416  WBC 6.8 7.1  HGB 7.2* 7.1*  HCT 22.3* 21.7*  MCV 87.5 88.6  PLT 378 343   Cardiac Enzymes: No results for input(s): "CKTOTAL", "CKMB", "CKMBINDEX", "TROPONINI" in the last 72 hours. BNP: Invalid input(s): "POCBNP" D-Dimer: No results for input(s): "DDIMER" in the last 72 hours. Hemoglobin A1C: No results for input(s): "HGBA1C" in the last 72 hours. Fasting Lipid Panel: Recent Labs    01/15/22 0640  CHOL 100  HDL 48  LDLCALC 41  TRIG 54  CHOLHDL 2.1   Thyroid Function Tests: No results for input(s): "TSH", "T4TOTAL", "T3FREE",  "THYROIDAB" in the last 72 hours.  Invalid input(s): "FREET3" Anemia Panel: No results for input(s): "VITAMINB12", "FOLATE", "FERRITIN", "TIBC", "IRON", "RETICCTPCT" in the last 72 hours.   PHYSICAL EXAM General: Well developed, well nourished, in no acute distress HEENT:  Normocephalic and atramatic Neck:  No JVD.  Lungs: Clear bilaterally to auscultation and percussion. Heart: HRRR . Normal S1 and S2 without gallops or murmurs.  Abdomen: Bowel sounds are positive, abdomen soft and non-tender  Msk:  Back normal, normal gait. Normal strength and tone for age. Extremities: No clubbing, cyanosis or edema.   Neuro: Alert and oriented X 3. Psych:  Good affect, responds appropriately  TELEMETRY: sinus rhythm, HR 66 bpm  ASSESSMENT AND PLAN: Patient resting comfortably in bed. Echocardiogram results pending. Patient currently in sinus rhythm. Continue po amiodarone. Consider Eliquis when anemia resolved. Palliative care consulted. Possible EGD today for unexplained weight loss. Will continue to follow.   Principal Problem:   COPD exacerbation ([place]) Active Problems:   GERD (gastroesophageal reflux disease)   Hyperlipidemia   Benign hypertension with chronic kidney disease   CAD (coronary artery disease)   Hyponatremia   Aortic atherosclerosis (HCC)   Unintentional weight loss   Malnutrition of moderate degree   Acute on chronic respiratory failure with hypoxia (HCC)   Normocytic anemia   Transaminitis   Atrial fibrillation with RVR ([place])    Kristi Swoboda, FNP-C 01/17/2022 9:02 AM

## 2022-01-17 NOTE — Progress Notes (Signed)
[facility]) Hospital liaison note  [facility] liaison continues to follow for discharge disposition and will assist in coordinating future care as appropriate once this is determined.   Thank you for the opportunity to participate in this patient's care Jhonnie Garner, BSN, RN Hospice hospital liaison [phone]

## 2022-01-17 NOTE — Progress Notes (Signed)
PROGRESS NOTE    Kristi Clark  WYO:[id number] DOB: 03-07-1951 DOA: 01/13/2022 PCP: [name], DO  Outpatient Specialists: cardiology ([place]), pulm ([place])    Brief Narrative:   From admission h and p  Kristi Clark is a 71 y.o. female with medical history significant of keratosis, basal cell carcinoma, COPD, depression, GERD, hyperlipidemia, hypertension, disease, moderate CAD treated medically who is coming to the emergency department with complaints of progressively worse acute on chronic dyspnea for the past 2 weeks associated with wheezing, occasional productive cough with yellowish sputum, pleuritic chest wall pain, fatigue and decreased appetite.  She has also continued to lose weight.  No fever, chills or night sweats. No sore throat, rhinorrhea, dyspnea, wheezing or hemoptysis.  No chest pain, palpitations, diaphoresis, PND, orthopnea, but occasionally gets pitting edema of the lower extremities.  No diarrhea, occasional constipation constipation, no melena or hematochezia.  No flank pain, dysuria, frequency or hematuria.  Has stress incontinence.  No polyuria, polydipsia, polyphagia or blurred vision.    Assessment & Plan:   Principal Problem:   COPD exacerbation ([place]) Active Problems:   GERD (gastroesophageal reflux disease)   Hyperlipidemia   Benign hypertension with chronic kidney disease   CAD (coronary artery disease)   Hyponatremia   Aortic atherosclerosis (HCC)   Unintentional weight loss   Malnutrition of moderate degree   Acute on chronic respiratory failure with hypoxia (HCC)   Normocytic anemia   Transaminitis   Atrial fibrillation with RVR (HCC)   # Acute on chronic hypoxic respiratory failure On 2-3 l at home, here more dyspneic than normal, that has resolved and now is breathing comfortably on 3 L - [place] o2, patient interested in humidified o2 at discharge, will query TOC  # COPD with acute exacerbation End-stage COPD, no PE on CTA. Per  pulm note hx pseudomonas. Respiratory pathogen panel neg, covid neg - cont steroids, bronchodilators - cont cefepime, likely convert to orals tomorrow when taking po - urine antigens, sputum for culture if able to produce, follow blood cultures  # End-of-life care - palliative consulted, interested in outpt palliative - needs hospital bed - ordered  # Debility - PT advising [facility]  # A-fib New dx. Tsh wnl. Cards following - amio per cards, close outpt f/u - deferring anticoagulation for now given anemia  # CAD - hold plavix given anemia, given down-trending hgb will also hold aspirin  # Anemia, normocytic Denies bleeding or melena. Does have dysphagia/odynophagia. Iron studies show some element of iron deficiency. 8s on arrival, 7.1 today, was in the 11s 6 months ago - aspirin/plavix held - gi consulted, plan for egd today - continue ppi bid  # Unintentional weight loss # dysphagia, odynophagia CT abdomen/pelvis, CT chest without acute process. EGD 2019 with erosive gastritis - PPI - egd today  # urinary retention Required I/o cath in ED and hospital day one but overnight has been able to void, not retaining. Now urinating better - bladder scan q shift, io cath as needed  # Hyponatremia Acute on chronic, here 124>126, baseline is around 130. On hctz at home but patient says hasn't been taking. Suspect siadh. Tsh wnl. Am cortisol this morning low normal. Urine sodium also low so may be somewhat dehydrated. Serum osms wnl, not jaundiced or lipemic. Sodium improved to 129 today with fluids yesterday - hold thiazide - cosyntropin stim results pending - decreased dose mirtazapine  # Constipation May be contributing to urinary retention.  - cont miralax,  senna  # HTN BP elevated today - resumed home losartan and hydralazine     DVT prophylaxis: SCDs Code Status: dnr Family Communication: daughter-in-law updated @ bedside 11/13  Level of care: Progressive Status is:  Inpatient Remains inpatient appropriate because: severity of illness    Consultants:  cardiology  Procedures: none  Antimicrobials:  Doxy>cefepime    Subjective: Feeling run down, breathing stable  Objective: Vitals:   01/16/22 2054 01/17/22 0016 01/17/22 0433 01/17/22 0818  BP:    (!) 162/66  Pulse:    70  Resp:    17  Temp:  98.4 F (36.9 C) 98.5 F (36.9 C) 98.2 F (36.8 C)  TempSrc:  Oral Oral Oral  SpO2: 100%   100%  Weight:      Height:        Intake/Output Summary (Last 24 hours) at 01/17/2022 1100 Last data filed at 01/17/2022 0915 Gross per 24 hour  Intake 242.82 ml  Output 600 ml  Net -357.18 ml   Filed Weights   01/13/22 0347 01/14/22 0455  Weight: 41.3 kg 44.4 kg    Examination:  General exam: Appears chronically ill Respiratory system: Clear to auscultation. Respiratory effort normal. Cardiovascular system: S1 & S2 heard, RRR. No JVD, murmurs, rubs, gallops or clicks. No pedal edema. Gastrointestinal system: Abdomen is nondistended, soft and nontender. No organomegaly or masses felt. Normal bowel sounds heard. Central nervous system: Alert and oriented. No focal neurological deficits. Extremities: Symmetric 5 x 5 power. Skin: No rashes, lesions or ulcers Psychiatry: Judgement and insight appear normal. Mood & affect appropriate.     Data Reviewed: I have personally reviewed following labs and imaging studies  CBC: Recent Labs  Lab 01/13/22 0353 01/14/22 0411 01/15/22 0640 01/16/22 0458 01/17/22 0416  WBC 8.3 10.8*  10.5 8.3 6.8 7.1  NEUTROABS 6.5 8.4*  --   --   --   HGB 8.1* 8.2*  8.3* 7.7* 7.2* 7.1*  HCT 24.6* 24.9*  25.1* 23.7* 22.3* 21.7*  MCV 87.9 87.4  86.9 86.8 87.5 88.6  PLT 319 349  346 343 378 099   Basic Metabolic Panel: Recent Labs  Lab 01/13/22 0353 01/13/22 2226 01/14/22 0411 01/15/22 0640 01/16/22 0458 01/17/22 0416  NA 124* 125* 124* 126* 127* 129*  K 3.7  --  3.7 4.3 4.1 4.5  CL 90*  --  91* 92*  95* 94*  CO2 28  --  '27 29 29 30  '$ GLUCOSE 107*  --  114* 97 123* 102*  BUN 15  --  '10 16 20 20  '$ CREATININE 0.53  --  0.52 0.54 0.53 0.50  CALCIUM 8.6*  --  8.7* 8.8* 8.1* 8.8*  MG  --  1.6*  --   --   --   --   PHOS  --   --  2.7  --   --   --    GFR: Estimated Creatinine Clearance: 35 mL/min (by C-G formula based on SCr of 0.5 mg/dL). Liver Function Tests: Recent Labs  Lab 01/13/22 0353 01/14/22 0411  AST 51* 44*  ALT 52* 52*  ALKPHOS 96 97  BILITOT 0.6 0.5  PROT 6.0* 6.2*  ALBUMIN 2.6* 2.7*   Recent Labs  Lab 01/13/22 0353  LIPASE 67*   No results for input(s): "AMMONIA" in the last 168 hours. Coagulation Profile: No results for input(s): "INR", "PROTIME" in the last 168 hours. Cardiac Enzymes: No results for input(s): "CKTOTAL", "CKMB", "CKMBINDEX", "TROPONINI" in the last 168 hours.  BNP (last 3 results) No results for input(s): "PROBNP" in the last 8760 hours. HbA1C: No results for input(s): "HGBA1C" in the last 72 hours. CBG: No results for input(s): "GLUCAP" in the last 168 hours. Lipid Profile: Recent Labs    01/15/22 0640  CHOL 100  HDL 48  LDLCALC 41  TRIG 54  CHOLHDL 2.1   Thyroid Function Tests: No results for input(s): "TSH", "T4TOTAL", "FREET4", "T3FREE", "THYROIDAB" in the last 72 hours.  Anemia Panel: No results for input(s): "VITAMINB12", "FOLATE", "FERRITIN", "TIBC", "IRON", "RETICCTPCT" in the last 72 hours.  Urine analysis:    Component Value Date/Time   COLORURINE YELLOW (A) 01/13/2022 0352   APPEARANCEUR CLEAR (A) 01/13/2022 0352   APPEARANCEUR Clear 12/27/2021 1316   LABSPEC 1.011 01/13/2022 0352   PHURINE 6.0 01/13/2022 0352   GLUCOSEU NEGATIVE 01/13/2022 0352   HGBUR NEGATIVE 01/13/2022 0352   BILIRUBINUR NEGATIVE 01/13/2022 0352   BILIRUBINUR Negative 12/27/2021 1316   KETONESUR 5 (A) 01/13/2022 0352   PROTEINUR 30 (A) 01/13/2022 0352   NITRITE NEGATIVE 01/13/2022 0352   LEUKOCYTESUR NEGATIVE 01/13/2022 0352   Sepsis  Labs: '@LABRCNTIP'$ (procalcitonin:4,lacticidven:4)  ) Recent Results (from the past 240 hour(s))  Resp Panel by RT-PCR (Flu A&B, Covid) Anterior Nasal Swab     Status: None   Collection Time: 01/13/22  3:53 AM   Specimen: Anterior Nasal Swab  Result Value Ref Range Status   SARS Coronavirus 2 by RT PCR NEGATIVE NEGATIVE Final    Comment: (NOTE) SARS-CoV-2 target nucleic acids are NOT DETECTED.  The SARS-CoV-2 RNA is generally detectable in upper respiratory specimens during the acute phase of infection. The lowest concentration of SARS-CoV-2 viral copies this assay can detect is 138 copies/mL. A negative result does not preclude SARS-Cov-2 infection and should not be used as the sole basis for treatment or other patient management decisions. A negative result may occur with  improper specimen collection/handling, submission of specimen other than nasopharyngeal swab, presence of viral mutation(s) within the areas targeted by this assay, and inadequate number of viral copies(<138 copies/mL). A negative result must be combined with clinical observations, patient history, and epidemiological information. The expected result is Negative.  Fact Sheet for Patients:  EntrepreneurPulse.com.au  Fact Sheet for Healthcare Providers:  IncredibleEmployment.be  This test is no t yet approved or cleared by the [place] FDA and  has been authorized for detection and/or diagnosis of SARS-CoV-2 by FDA under an Emergency Use Authorization (EUA). This EUA will remain  in effect (meaning this test can be used) for the duration of the COVID-19 declaration under Section 564(b)(1) of the Act, 21 U.S.C.section 360bbb-3(b)(1), unless the authorization is terminated  or revoked sooner.       Influenza A by PCR NEGATIVE NEGATIVE Final   Influenza B by PCR NEGATIVE NEGATIVE Final    Comment: (NOTE) The Xpert Xpress SARS-CoV-2/FLU/RSV plus assay is intended as an  aid in the diagnosis of influenza from Nasopharyngeal swab specimens and should not be used as a sole basis for treatment. Nasal washings and aspirates are unacceptable for Xpert Xpress SARS-CoV-2/FLU/RSV testing.  Fact Sheet for Patients: EntrepreneurPulse.com.au  Fact Sheet for Healthcare Providers: IncredibleEmployment.be  This test is not yet approved or cleared by the [place] FDA and has been authorized for detection and/or diagnosis of SARS-CoV-2 by FDA under an Emergency Use Authorization (EUA). This EUA will remain in effect (meaning this test can be used) for the duration of the COVID-19 declaration under Section 564(b)(1) of the Act, 21  U.S.C. section 360bbb-3(b)(1), unless the authorization is terminated or revoked.  Performed at [facility], [address] [place], [place] [address]   Culture, blood (routine x 2)     Status: None (Preliminary result)   Collection Time: 01/13/22  6:30 AM   Specimen: BLOOD  Result Value Ref Range Status   Specimen Description BLOOD LEFT FOREARM  Final   Special Requests   Final    BOTTLES DRAWN AEROBIC AND ANAEROBIC Blood Culture results may not be optimal due to an inadequate volume of blood received in culture bottles   Culture   Final    NO GROWTH 4 DAYS Performed at [facility], [address], [place], [place] [address]    Report Status PENDING  Incomplete  Culture, blood (routine x 2)     Status: None (Preliminary result)   Collection Time: 01/13/22  6:30 AM   Specimen: BLOOD  Result Value Ref Range Status   Specimen Description BLOOD RIGHT FOREARM  Final   Special Requests   Final    BOTTLES DRAWN AEROBIC AND ANAEROBIC Blood Culture adequate volume   Culture   Final    NO GROWTH 4 DAYS Performed at [facility], [address] [place], [place] [address]    Report Status PENDING  Incomplete  Respiratory (~20 pathogens) panel by PCR     Status:  None   Collection Time: 01/14/22  2:05 AM   Specimen: Nasopharyngeal Swab; Respiratory  Result Value Ref Range Status   Adenovirus NOT DETECTED NOT DETECTED Final   Coronavirus 229E NOT DETECTED NOT DETECTED Final    Comment: (NOTE) The Coronavirus on the Respiratory Panel, DOES NOT test for the novel  Coronavirus (2019 nCoV)    Coronavirus HKU1 NOT DETECTED NOT DETECTED Final   Coronavirus NL63 NOT DETECTED NOT DETECTED Final   Coronavirus OC43 NOT DETECTED NOT DETECTED Final   Metapneumovirus NOT DETECTED NOT DETECTED Final   Rhinovirus / Enterovirus NOT DETECTED NOT DETECTED Final   Influenza A NOT DETECTED NOT DETECTED Final   Influenza B NOT DETECTED NOT DETECTED Final   Parainfluenza Virus 1 NOT DETECTED NOT DETECTED Final   Parainfluenza Virus 2 NOT DETECTED NOT DETECTED Final   Parainfluenza Virus 3 NOT DETECTED NOT DETECTED Final   Parainfluenza Virus 4 NOT DETECTED NOT DETECTED Final   Respiratory Syncytial Virus NOT DETECTED NOT DETECTED Final   Bordetella pertussis NOT DETECTED NOT DETECTED Final   Bordetella Parapertussis NOT DETECTED NOT DETECTED Final   Chlamydophila pneumoniae NOT DETECTED NOT DETECTED Final   Mycoplasma pneumoniae NOT DETECTED NOT DETECTED Final    Comment: Performed at [facility], [place] [address], [place], [place] [address]  Expectorated Sputum Assessment w Gram Stain, Rflx to Resp Cult     Status: None   Collection Time: 01/16/22  5:15 PM   Specimen: Sputum  Result Value Ref Range Status   Specimen Description SPUTUM  Final   Special Requests EXPSU  Final   Sputum evaluation   Final    THIS SPECIMEN IS ACCEPTABLE FOR SPUTUM CULTURE Performed at [facility], [address], [place],  [address]    Report Status 01/16/2022 FINAL  Final  Culture, Respiratory w Gram Stain     Status: None (Preliminary result)   Collection Time: 01/16/22  5:15 PM   Specimen: SPU  Result Value Ref Range Status   Specimen Description    Final    SPUTUM Performed at [facility], [address] [place], [place] [address]    Special Requests   Final    EXPSU Reflexed from [id number] Performed at [facility], [address] [place], [place] [address]    Gram Stain   Final    FEW SQUAMOUS EPITHELIAL CELLS PRESENT FEW WBC PRESENT, PREDOMINANTLY MONONUCLEAR MODERATE YEAST WITH PSEUDOHYPHAE RARE GRAM POSITIVE COCCI IN PAIRS    Culture   Final    TOO YOUNG TO READ Performed at [facility], [place] [address], [place], [place] [address]    Report Status PENDING  Incomplete         Radiology Studies: No results found.      Scheduled Meds:  amiodarone  400 mg Oral BID   atorvastatin  40 mg Oral Daily   feeding supplement  237 mL Oral BID BM   hydrALAZINE  12.5 mg Oral BID   ipratropium-albuterol  3 mL Nebulization TID   isosorbide mononitrate  60 mg Oral Daily   loratadine  10 mg Oral Daily   losartan  25 mg Oral Daily   metoprolol succinate  50 mg Oral Daily   mirtazapine  7.5 mg Oral QHS   multivitamin with minerals  1 tablet Oral Daily   nicotine  14 mg Transdermal Daily   pantoprazole (PROTONIX) IV  40 mg Intravenous Q12H   polyethylene glycol  34 g Oral Daily   predniSONE  40 mg Oral Q breakfast   senna  1 tablet Oral Daily   Continuous Infusions:  ceFEPime (MAXIPIME) IV 2 g (01/17/22 0941)     LOS: 4 days     Desma Maxim, MD [facility]   If 7PM-7AM, please contact night-coverage [email or web address] Password [email or web address] 01/17/2022, 11:00 AM

## 2022-01-17 NOTE — Consult Note (Addendum)
GI Inpatient Consult Note  Reason for Consult: anemia   Attending Requesting Consult: Dr. Si Raider  History of Present Illness: Kristi Clark is a 71 y.o. female seen for evaluation of anemia at the request of Dr. Si Raider. PMHx of COPD, GERD, HLD, CKD, CAD. Admitted on 01/10/22 for SOB. Noted to have several weeks of CAP requiring ABX and also unintention wt loss. Diagnosed w/ new onset atrial fib.    Reports having a chronic history of GERD and taking Protonix 40 mg once a day.  As long as she takes PPIs she does not feel GERD.  She has some mild nausea and some dry heaving but these seem to be induced by coughing episodes typically.  She denies significant abdominal pain with eating.  She feels that she has some dysphagia because she cannot chew well due to not having bottom teeth.  She overall has had a poor appetite since her respiratory symptoms have worsened recently.  States she has lost about 20 pounds since early 2023, does not get as hungry as she used to.  She has mild constipation at home and uses MiraLAX on an as-needed basis.  Generally has a bowel movement every 2 to 3 days.  Denies any lower abdominal pain or rectal bleeding.  She has been on Plavix for many years.  Daughter is at bedside and helps with history.  She denies alcohol intake.  She smokes 2 packs/day.  She denies NSAID use.  Mainly uses Tylenol  Last Colonoscopy: 2019-diverticulosis sigmoid, tortuous colon Last Endoscopy: 2019-bile gastritis, mucosal variant duodenum   Past Medical History:  Past Medical History:  Diagnosis Date   AK (actinic keratosis) 10/25/2017   left forehead   BCC (basal cell carcinoma) 10/25/2017   left upper lateral eyelid, Moh's   COPD (chronic obstructive pulmonary disease) (HCC)    Depression    GERD (gastroesophageal reflux disease)    Hyperlipidemia    Hypertension    Menopause    Osteopenia    Tobacco abuse     Problem List: Patient Active Problem List   Diagnosis Date  Noted   COPD exacerbation ([place]) 01/13/2022   Normocytic anemia 01/13/2022   Transaminitis 01/13/2022   Atrial fibrillation with RVR ([place]) 01/13/2022   Pneumonia of left lower lobe due to infectious organism 11/23/2021   COVID-19 11/15/2021   Chronic respiratory failure with hypoxia ([place]) 09/21/2021   Acute on chronic respiratory failure with hypoxia ([place]) 09/11/2021   Valvular vegetation 05/06/2021   Malnutrition of moderate degree 04/29/2021   Unintentional weight loss 04/28/2021   Congestive heart failure ([place]) 03/15/2021   Cramps, extremity 05/29/2020   Aortic atherosclerosis ([place]) 05/27/2020   Hyponatremia 02/17/2020   Hypokalemia 02/17/2020   Nicotine dependence 02/17/2020   Postherpetic neuralgia 11/24/2019   Nicotine dependence, cigarettes, w unsp disorders 10/17/2017   IBS (irritable bowel syndrome) 04/04/2017   Advanced care planning/counseling discussion 10/03/2016   CAD (coronary artery disease) 10/03/2016   History of anemia 10/03/2016   Benign hypertension with chronic kidney disease 03/06/2015   Allergic rhinitis 09/03/2014   CKD (chronic kidney disease), 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   GERD (gastroesophageal reflux disease) 09/03/2014   Hyperlipidemia 09/03/2014   Chronic anemia 07/14/2014   COPD, severe ([place]) 08/08/2013   Pulmonary nodules 08/08/2013    Past Surgical History: Past Surgical History:  Procedure Laterality Date   CARDIAC CATHETERIZATION     CHOLECYSTECTOMY  2014   COLONOSCOPY WITH PROPOFOL N/A 01/16/2018   Procedure: COLONOSCOPY WITH PROPOFOL;  Surgeon:  Lollie Sails, MD;  [facility] ENDOSCOPY;  Service: Endoscopy;  Laterality: N/A;   ESOPHAGOGASTRODUODENOSCOPY (EGD) WITH PROPOFOL N/A 01/16/2018   Procedure: ESOPHAGOGASTRODUODENOSCOPY (EGD) WITH PROPOFOL;  Surgeon: Lollie Sails, MD;  Location: [facility] ENDOSCOPY;  Service: Endoscopy;  Laterality: N/A;   heart stent     LEFT HEART CATH AND CORONARY ANGIOGRAPHY N/A 09/17/2021   Procedure: LEFT  HEART CATH AND CORONARY ANGIOGRAPHY;  Surgeon: Dionisio David, MD;  Location: [place] CV LAB;  Service: Cardiovascular;  Laterality: N/A;   TEE WITHOUT CARDIOVERSION N/A 05/11/2021   Procedure: TRANSESOPHAGEAL ECHOCARDIOGRAM (TEE);  Surgeon: Corey Skains, MD;  Location: [facility] ORS;  Service: Cardiovascular;  Laterality: N/A;   TOTAL ABDOMINAL HYSTERECTOMY      Allergies: Allergies  Allergen Reactions   Clindamycin/Lincomycin Hives   Prednisone Other (See Comments)    Psychosis   Amoxicillin Rash   Avelox [Moxifloxacin Hcl In Nacl] Rash   Codeine Sulfate Nausea Only   Penicillins Rash   Sulfa Antibiotics Rash    Home Medications: Facility-Administered Medications Prior to Admission  Medication Dose Route Frequency Provider Last Rate Last Admin   albuterol (PROVENTIL) (2.5 MG/3ML) 0.083% nebulizer solution 2.5 mg  2.5 mg Nebulization Once Johnson, Megan P, DO       triamcinolone acetonide (KENALOG-40) injection 40 mg  40 mg Intramuscular Once Johnson, Megan P, DO       Medications Prior to Admission  Medication Sig Dispense Refill Last Dose   albuterol (PROVENTIL) (2.5 MG/3ML) 0.083% nebulizer solution Take 3 mLs (2.5 mg total) by nebulization every 6 (six) hours as needed for wheezing or shortness of breath. 150 mL 1 Unknown at PRN   albuterol (VENTOLIN HFA) 108 (90 Base) MCG/ACT inhaler Inhale 2 puffs into the lungs every 6 (six) hours as needed for wheezing or shortness of breath. 18 g 3 Unknown at PRN   aspirin 81 MG tablet Take 81 mg by mouth daily.      atorvastatin (LIPITOR) 40 MG tablet Take 1 tablet (40 mg total) by mouth daily. 90 tablet 1 01/12/2022 at Unknown   Blood Pressure Monitoring (BLOOD PRESSURE MONITOR/WRIST) KIT Take Blood pressure as needed, Dx: I12.9 1 kit 0    cetirizine (ZYRTEC) 10 MG tablet Take 10 mg by mouth daily as needed for allergies.   Unknown at PRN   clopidogrel (PLAVIX) 75 MG tablet Take 1 tablet (75 mg total) by mouth daily. 90 tablet 0  01/12/2022 at 0800   doxycycline (VIBRA-TABS) 100 MG tablet Take 1 tablet (100 mg total) by mouth 2 (two) times daily. 20 tablet 0 01/12/2022 at 0800   fluticasone-salmeterol (ADVAIR DISKUS) 250-50 MCG/ACT AEPB Inhale 1 puff into the lungs in the morning and at bedtime. 60 each 3 01/12/2022 at 0800   hydrALAZINE (APRESOLINE) 25 MG tablet Take 0.5 tablets (12.5 mg total) by mouth 2 (two) times daily. 90 tablet 1 01/12/2022 at 0800   hydrochlorothiazide (MICROZIDE) 12.5 MG capsule Take 1 capsule (12.5 mg total) by mouth daily as needed. (Patient taking differently: Take 12.5 mg by mouth daily as needed (fluid retention).) 30 capsule 3 Unknown at PRN   isosorbide mononitrate (IMDUR) 60 MG 24 hr tablet Take 1 tablet (60 mg total) by mouth 2 (two) times daily. 90 tablet 1 01/12/2022 at 0800   losartan (COZAAR) 25 MG tablet Take 1 tablet (25 mg total) by mouth daily. 90 tablet 1 01/12/2022 at 0800   metoprolol succinate (TOPROL-XL) 50 MG 24 hr tablet Take 1 tablet (50  mg total) by mouth daily. Take with or immediately following a meal. 90 tablet 0 01/12/2022 at 0800   mirtazapine (REMERON) 45 MG tablet Take 1 tablet (45 mg total) by mouth at bedtime. 90 tablet 1 01/11/2022 at 2000   Multiple Vitamins-Minerals (CENTRUM SILVER ADULT 50+ PO) Take 1 tablet by mouth daily.      Multiple Vitamins-Minerals (PRESERVISION AREDS 2+MULTI VIT) CAPS Take 1 capsule by mouth at bedtime.      ondansetron (ZOFRAN) 4 MG tablet Take 1 tablet (4 mg total) by mouth every 8 (eight) hours as needed for nausea or vomiting. 30 tablet 1 Unknown at PRN   polyethylene glycol powder (GLYCOLAX/MIRALAX) 17 GM/SCOOP powder Take 17 g by mouth daily as needed for moderate constipation. 3350 g 1 Unknown at PRN   Probiotic Product (PROBIOTIC-10 PO) Take 1 tablet by mouth every other day. Every other day      saline (AYR) GEL Place 1 Application into both nostrils every 4 (four) hours as needed. 14.1 g 6 Unknown at PRN   tiotropium (SPIRIVA) 18 MCG  inhalation capsule Place 1 capsule (18 mcg total) into inhaler and inhale daily. 90 capsule 3 01/12/2022 at [place] medication reconciliation was completed with the patient.   Scheduled Inpatient Medications:    amiodarone  400 mg Oral BID   atorvastatin  40 mg Oral Daily   feeding supplement  237 mL Oral BID BM   hydrALAZINE  12.5 mg Oral BID   ipratropium-albuterol  3 mL Nebulization TID   isosorbide mononitrate  60 mg Oral Daily   loratadine  10 mg Oral Daily   losartan  25 mg Oral Daily   metoprolol succinate  50 mg Oral Daily   mirtazapine  7.5 mg Oral QHS   multivitamin with minerals  1 tablet Oral Daily   nicotine  14 mg Transdermal Daily   pantoprazole (PROTONIX) IV  40 mg Intravenous Q12H   polyethylene glycol  34 g Oral Daily   predniSONE  40 mg Oral Q breakfast   senna  1 tablet Oral Daily    Continuous Inpatient Infusions:    ceFEPime (MAXIPIME) IV 2 g (01/16/22 2153)    PRN Inpatient Medications:  acetaminophen **OR** acetaminophen, albuterol, ondansetron **OR** ondansetron (ZOFRAN) IV  Family History: family history includes Asthma in her son; Breast cancer in her paternal aunt; Breast cancer (age of onset: 56) in her sister; Cancer in her maternal grandmother, mother, sister, and son; Diabetes in her brother, brother, daughter, and sister; Heart disease in her brother and father; Hyperlipidemia in her father; Hypertension in her daughter; Stroke in her mother.    Social History:   reports that she has been smoking cigarettes. She has a 56.00 pack-year smoking history. She has never used smokeless tobacco. She reports that she does not drink alcohol and does not use drugs.   Review of Systems: Constitutional: +wt loss  Eyes: No changes in vision. ENT: No oral lesions, sore throat.  GI: see HPI.  Heme/Lymph: No easy bruising.  CV: No chest pain.  GU: No hematuria.  Integumentary: No rashes.  Neuro: No headaches.  Psych: No depression/anxiety.   Endocrine: No heat/cold intolerance.  Allergic/Immunologic: No urticaria.  Resp: + cough, SOB.  Musculoskeletal: No joint swelling.    Physical Examination: BP (!) 162/66 (BP Location: Right Arm)   Pulse 70   Temp 98.2 F (36.8 C) (Oral)   Resp 17   Ht _0  (1.346 m)   Wt 44.4  kg   LMP  (LMP Unknown)   SpO2 100%   BMI 24.50 kg/m  Gen: NAD, alert and oriented x 4, on O2 HEENT: PEERLA, EOMI, Neck: supple, no JVD or thyromegaly Chest: rhonchi bilateral bases CV: RRR, no m/g/c/r Abd: soft, NT, ND, +BS in all four quadrants; no HSM, guarding, ridigity, or rebound tenderness Ext: no edema, well perfused with 2+ pulses, Skin: no rash or lesions noted Lymph: no LAD  Data: Lab Results  Component Value Date   WBC 7.1 01/17/2022   HGB 7.1 (L) 01/17/2022   HCT 21.7 (L) 01/17/2022   MCV 88.6 01/17/2022   PLT 343 01/17/2022   Recent Labs  Lab 01/15/22 0640 01/16/22 0458 01/17/22 0416  HGB 7.7* 7.2* 7.1*   Lab Results  Component Value Date   NA 129 (L) 01/17/2022   K 4.5 01/17/2022   CL 94 (L) 01/17/2022   CO2 30 01/17/2022   BUN 20 01/17/2022   CREATININE 0.50 01/17/2022   Lab Results  Component Value Date   ALT 52 (H) 01/14/2022   AST 44 (H) 01/14/2022   ALKPHOS 97 01/14/2022   BILITOT 0.5 01/14/2022   No results for input(s): "APTT", "INR", "PTT" in the last 168 hours.  Admission Hgb 8.3. Baseline Hgb 09/2021-9.1.  08/2021-Hgb 11.0.  IMPRESSION: CT a/p 01/14/22 -- 1. No acute abnormalities within the abdomen or pelvis. 2. Bilateral pleural effusions with severe interstitial lung changes. 3. Minimal pericardial effusion. 4. Diffuse subcutaneous edema. 5. Aortic atherosclerosis. Aortic Atherosclerosis (ICD10-I70.0).  MBSS-Pt presents with adequate oropharyngeal abilities when consuming thin liquids via straw, applesauce and grilled cheese sandwich from her lunch tray. Despite this, her risk of aspiration is increased d/t current respiratory status and  medical condition. Recommend continuation of current diet with strict aspiration precautions.   Assessment/Plan: Kristi Clark is a 71 y.o. female Admitted for worsening shortness of breath in setting of COPD on home oxygen, found to have atrial fib, anemia noted during hospitalization.  Acute on chronic Anemia - normocytic w/ iron 17 and ferritin 44, decreased %. Hgb in June 2023 was 11.0, dropped to 9.1 in July 2023, was 8.3 on admission. Last EGD/colonoscopy was 2019.  Has some weight loss but this seems more related to worsening respiratory symptoms recently and overall not feeling well.  She does not have any obvious GI bleeding.  Denies NSAID use.  On PPI at home.  We discussed endoscopy for evaluation and she would like to think about this, she currently has hesitations due to her respiratory status.  States she is "tired and just wants to go home".  Need to confirm long-term goals prior to EGD as patient may benefit from receiving transfusion for symptomatic improvement, has discussed w/ hospice and family plans to discuss further. Severe COPD- acute on chronic hypoxic respiratory failure with ACOPDE CKD Unintentional weight loss A fib new- not on oac Generalized debility constipation  Recommendations: Dr. Virgina Jock to discuss potential EGD further w/ patient and family  Plavix on hold since 11/10 in case EGD needed Pt NPO for now for potential procedure today, can advance diet if no procedure planned May benefit from transfusion   Case was discussed with Dr. Virgina Jock. Thank you for the consult. Please call with questions or concerns.  Ezzard Standing, PA-C [facility]

## 2022-01-17 NOTE — Consult Note (Signed)
Consultation Note Date: 01/17/2022   Patient Name: Kristi Clark  DOB: [birth year]  MRN: [id number]  Age / Sex: 71 y.o., female  PCP: [name], DO Referring Physician: Gwynne Edinger, MD  Reason for Consultation: Establishing goals of care  HPI/Patient Profile: Kristi Clark is a 71 y.o. female with medical history significant of keratosis, basal cell carcinoma, COPD, depression, GERD, hyperlipidemia, hypertension, disease, moderate CAD treated medically who is coming to the emergency department with complaints of progressively worse acute on chronic dyspnea for the past 2 weeks associated with wheezing, occasional productive cough with yellowish sputum, pleuritic chest wall pain, fatigue and decreased appetite.  She has also continued to lose weight.   Clinical Assessment and Goals of Care: Notes and labs reviewed.  In to see patient.  Patient's daughter-in-law is at bedside and patient's son is on speaker phone.  Patient discusses that at baseline she wears 2 L of oxygen at home.  She goes out with family members as she desires with portable O2.  No assistive devices.  Son states that prior to around 4 months ago patient was able to drive, manage her bills, and do everything independently.  GI into conversation.  They discussed care moving forward and options.  Plans in place for contrasted imaging, medication, monitoring hemoglobin, and EGD later on this week pending outcome.  GI left bedside.  Patient states that her husband had been under hospice care and she feels that her time is very limited.  Patient's daughter-in-law stepped out for [place] to talk.  Patient states she just feels that her time is very limited, but does want to be on this earth as long as she is able to.  We discussed her quality of life prior to this admission.  She states that she has been more confused and has required  assistance with things like paying her bills.  She states she has had a lot of physical issues this year, "1 after another".  She states that she had COVID a few weeks ago along with pneumonia.  We discussed confusion that can sometimes follow COVID.    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  It is difficult for her not to be able to continue on in this capacity.  She discusses anxiety and sometimes panic attacks, and shortness of breath that comes with anxiety.  She discusses her worries and concerns.    SUMMARY OF RECOMMENDATIONS   Plans are currently in place for continued work-up with GI.  Patient discusses anxiety and shortness of breath that comes with anxiety.  She would be an amenable to medication to help with anxiety.    PMT will continue to follow.         Primary Diagnoses: Present on Admission:  COPD exacerbation (HCC)  GERD (gastroesophageal reflux disease)  Hyponatremia  Hyperlipidemia  Malnutrition of moderate degree  Unintentional weight loss  CAD (coronary artery disease)  Normocytic anemia  Transaminitis  Benign hypertension with chronic kidney disease  Aortic atherosclerosis (HCC)  Acute on chronic respiratory failure with hypoxia (HCC)  Atrial fibrillation with RVR ([place])   I have reviewed the medical record, interviewed the patient and family, and examined the patient. The following aspects are pertinent.  Past Medical History:  Diagnosis Date   AK (actinic keratosis) 10/25/2017   left forehead   BCC (basal cell carcinoma) 10/25/2017   left upper lateral eyelid, Moh's   COPD (chronic obstructive pulmonary disease) (HCC)    Depression    GERD (gastroesophageal reflux disease)    Hyperlipidemia    Hypertension    Menopause    Osteopenia    Tobacco abuse    Social History   Socioeconomic History   Marital status: Widowed    Spouse name: Not on file   Number of children: 2   Years of  education: Not on file   Highest education level: 10th grade  Occupational History   Occupation: retired  Tobacco Use   Smoking status: Every Day    Packs/day: 1.00    Years: 56.00    Total pack years: 56.00    Types: Cigarettes   Smokeless tobacco: Never  Vaping Use   Vaping Use: Never used  Substance and Sexual Activity   Alcohol use: No   Drug use: No   Sexual activity: Never  Other Topics Concern   Not on file  Social History Narrative   Lives with great grandson, [place] Ponder   Social Determinants of Health   Financial Resource Strain: Low Risk  (05/31/2021)   Overall Financial Resource Strain (CARDIA)    Difficulty of Paying Living Expenses: Not hard at all  Food Insecurity: No Food Insecurity (01/14/2022)   Hunger Vital Sign    Worried About Running Out of Food in the Last Year: Never true    [place] in the Last Year: Never true  Transportation Needs: No Transportation Needs (01/14/2022)   PRAPARE - Hydrologist (Medical): No    Lack of Transportation (Non-Medical): No  Physical Activity: Inactive (05/31/2021)   Exercise Vital Sign    Days of Exercise per Week: 0 days    Minutes of Exercise per Session: 0 min  Stress: No Stress Concern Present (05/31/2021)   [place]    Feeling of Stress : Not at all  Social Connections: Socially Isolated (05/31/2021)   Social Connection and Isolation Panel [NHANES]    Frequency of Communication with Friends and Family: More than three times a week    Frequency of Social Gatherings with Friends and Family: Twice a week    Attends Religious Services: Never    Marine scientist or Organizations: No    Attends Archivist Meetings: Never    Marital Status: Widowed   Family History  Problem Relation Age of Onset   Cancer Mother    Stroke Mother    Heart disease Father    Hyperlipidemia Father    Cancer Sister     Diabetes Sister    Breast cancer Sister 62   Diabetes Brother    Asthma Son    Cancer Son    Diabetes Daughter    Hypertension Daughter    Cancer Maternal Grandmother        gallbladder   Diabetes Brother    Heart disease Brother    Breast cancer Paternal Aunt    Scheduled Meds:  amiodarone  400 mg Oral BID   atorvastatin  40 mg Oral Daily   feeding supplement  237 mL Oral BID BM   hydrALAZINE  12.5 mg Oral BID   ipratropium-albuterol  3 mL Nebulization TID   isosorbide mononitrate  60 mg Oral Daily   loratadine  10 mg Oral Daily   losartan  25 mg Oral Daily   metoprolol succinate  50 mg Oral Daily   mirtazapine  7.5 mg Oral QHS   multivitamin with minerals  1 tablet Oral Daily   nicotine  14 mg Transdermal Daily   pantoprazole (PROTONIX) IV  40 mg Intravenous Q12H   polyethylene glycol  34 g Oral Daily   predniSONE  40 mg Oral Q breakfast   senna  1 tablet Oral Daily   Continuous Infusions:  ceFEPime (MAXIPIME) IV 2 g (01/17/22 0941)   PRN Meds:.acetaminophen **OR** acetaminophen, albuterol, ondansetron **OR** ondansetron (ZOFRAN) IV Medications Prior to Admission:  Prior to Admission medications   Medication Sig Start Date End Date Taking? Authorizing Provider  albuterol (PROVENTIL) (2.5 MG/3ML) 0.083% nebulizer solution Take 3 mLs (2.5 mg total) by nebulization every 6 (six) hours as needed for wheezing or shortness of breath. 11/02/21  Yes Johnson, Megan P, DO  albuterol (VENTOLIN HFA) 108 (90 Base) MCG/ACT inhaler Inhale 2 puffs into the lungs every 6 (six) hours as needed for wheezing or shortness of breath. 08/16/21  Yes Johnson, Megan P, DO  aspirin 81 MG tablet Take 81 mg by mouth daily.   Yes [provider]  atorvastatin (LIPITOR) 40 MG tablet Take 1 tablet (40 mg total) by mouth daily. 08/16/21  Yes Johnson, Megan P, DO  Blood Pressure Monitoring (BLOOD PRESSURE MONITOR/WRIST) KIT Take Blood pressure as needed, Dx: I12.9 12/09/20  Yes Johnson, Megan P, DO   cetirizine (ZYRTEC) 10 MG tablet Take 10 mg by mouth daily as needed for allergies.   Yes [provider]  clopidogrel (PLAVIX) 75 MG tablet Take 1 tablet (75 mg total) by mouth daily. 01/11/22  Yes Johnson, Megan P, DO  doxycycline (VIBRA-TABS) 100 MG tablet Take 1 tablet (100 mg total) by mouth 2 (two) times daily. 01/09/22  Yes Johnson, Megan P, DO  fluticasone-salmeterol (ADVAIR DISKUS) 250-50 MCG/ACT AEPB Inhale 1 puff into the lungs in the morning and at bedtime. 08/16/21  Yes Johnson, Megan P, DO  hydrALAZINE (APRESOLINE) 25 MG tablet Take 0.5 tablets (12.5 mg total) by mouth 2 (two) times daily. 10/05/21  Yes Johnson, Megan P, DO  hydrochlorothiazide (MICROZIDE) 12.5 MG capsule Take 1 capsule (12.5 mg total) by mouth daily as needed. Patient taking differently: Take 12.5 mg by mouth daily as needed (fluid retention). 12/27/21  Yes Johnson, Megan P, DO  isosorbide mononitrate (IMDUR) 60 MG 24 hr tablet Take 1 tablet (60 mg total) by mouth 2 (two) times daily. 09/10/21  Yes Johnson, Megan P, DO  losartan (COZAAR) 25 MG tablet Take 1 tablet (25 mg total) by mouth daily. 10/12/21  Yes Johnson, Megan P, DO  metoprolol succinate (TOPROL-XL) 50 MG 24 hr tablet Take 1 tablet (50 mg total) by mouth daily. Take with or immediately following a meal. 01/11/22  Yes Johnson, Megan P, DO  mirtazapine (REMERON) 45 MG tablet Take 1 tablet (45 mg total) by mouth at bedtime. 12/21/21  Yes Johnson, Megan P, DO  Multiple Vitamins-Minerals (CENTRUM SILVER ADULT 50+ PO) Take 1 tablet by mouth daily.   Yes [provider]  Multiple Vitamins-Minerals (PRESERVISION AREDS 2+MULTI VIT)  CAPS Take 1 capsule by mouth at bedtime.   Yes [provider]  ondansetron (ZOFRAN) 4 MG tablet Take 1 tablet (4 mg total) by mouth every 8 (eight) hours as needed for nausea or vomiting. 09/10/21  Yes Johnson, Megan P, DO  polyethylene glycol powder (GLYCOLAX/MIRALAX) 17 GM/SCOOP powder Take 17 g by mouth daily as needed  for moderate constipation. 12/09/20  Yes Johnson, Megan P, DO  Probiotic Product (PROBIOTIC-10 PO) Take 1 tablet by mouth every other day. Every other day   Yes [provider]  saline (AYR) GEL Place 1 Application into both nostrils every 4 (four) hours as needed. 12/10/21  Yes Johnson, Megan P, DO  tiotropium (SPIRIVA) 18 MCG inhalation capsule Place 1 capsule (18 mcg total) into inhaler and inhale daily. 03/29/21  Yes Jon [place], NP   Allergies  Allergen Reactions   Clindamycin/Lincomycin Hives   Prednisone Other (See Comments)    Psychosis   Amoxicillin Rash   Avelox [Moxifloxacin Hcl In Nacl] Rash   Codeine Sulfate Nausea Only   Penicillins Rash   Sulfa Antibiotics Rash   Review of Systems  Constitutional:        States she has a feeling her time is very limited.    Physical Exam Pulmonary:     Effort: Pulmonary effort is normal.  Neurological:     Mental Status: She is alert.     Vital Signs: BP (!) 180/68 (BP Location: Right Wrist)   Pulse 74   Temp 98.4 F (36.9 C) (Oral)   Resp 20   Ht _0  (1.346 m)   Wt 44.4 kg   LMP  (LMP Unknown)   SpO2 97%   BMI 24.50 kg/m  Pain Scale: 0-10   Pain Score: 0-No pain   SpO2: SpO2: 97 % O2 Device:SpO2: 97 % O2 Flow Rate: .O2 Flow Rate (L/min): 3 L/min  IO: Intake/output summary:  Intake/Output Summary (Last 24 hours) at 01/17/2022 1328 Last data filed at 01/17/2022 1234 Gross per 24 hour  Intake 242.82 ml  Output 700 ml  Net -457.18 ml    LBM: Last BM Date : 01/12/22 Baseline Weight: Weight: 41.3 kg Most recent weight: Weight: 44.4 kg      Signed by: Asencion Gowda, NP   Please contact Palliative Medicine Team phone at [phone] for questions and concerns.  For individual provider: See Shea Evans

## 2022-01-18 ENCOUNTER — Ambulatory Visit: Payer: Medicare Other | Admitting: Family Medicine

## 2022-01-18 LAB — BASIC METABOLIC PANEL
Anion gap: 7 (ref 5–15)
BUN: 18 mg/dL (ref 8–23)
CO2: 31 mmol/L (ref 22–32)
Calcium: 9 mg/dL (ref 8.9–10.3)
Chloride: 94 mmol/L — ABNORMAL LOW (ref 98–111)
Creatinine, Ser: 0.55 mg/dL (ref 0.44–1.00)
GFR, Estimated: >60 mL/min (ref >60)
Glucose, Bld: 119 mg/dL — ABNORMAL HIGH (ref 70–99)
Potassium: 4.5 mmol/L (ref 3.5–5.1)
Sodium: 132 mmol/L — ABNORMAL LOW (ref 135–145)

## 2022-01-18 LAB — CBC
HCT: 21.9 % — ABNORMAL LOW (ref 36.0–46.0)
Hemoglobin: 7.1 g/dL — ABNORMAL LOW (ref 12.0–15.0)
MCH: 28.7 pg (ref 26.0–34.0)
MCHC: 32.4 g/dL (ref 30.0–36.0)
MCV: 88.7 fL (ref 80.0–100.0)
Platelets: 368 10*3/uL (ref 150–400)
RBC: 2.47 MIL/uL — ABNORMAL LOW (ref 3.87–5.11)
RDW: 17.7 % — ABNORMAL HIGH (ref 11.5–15.5)
WBC: 6.4 10*3/uL (ref 4.0–10.5)
nRBC: 0 % (ref 0.0–0.2)

## 2022-01-18 LAB — CULTURE, BLOOD (ROUTINE X 2)
Culture: NO GROWTH
Culture: NO GROWTH
Special Requests: ADEQUATE

## 2022-01-18 LAB — HEMOGLOBIN AND HEMATOCRIT, BLOOD
HCT: 28.9 % — ABNORMAL LOW (ref 36.0–46.0)
Hemoglobin: 9.8 g/dL — ABNORMAL LOW (ref 12.0–15.0)

## 2022-01-18 LAB — ABO/RH: ABO/RH(D): A NEG

## 2022-01-18 LAB — PREPARE RBC (CROSSMATCH)

## 2022-01-18 MED ORDER — LOSARTAN POTASSIUM 50 MG PO TABS
100.0000 mg | ORAL_TABLET | Freq: Every day | ORAL | Status: DC
  Administered 2022-01-19 – 2022-01-21 (×3): 100 mg via ORAL
  Filled 2022-01-18 (×3): qty 2

## 2022-01-18 MED ORDER — AMLODIPINE BESYLATE 10 MG PO TABS
10.0000 mg | ORAL_TABLET | Freq: Every day | ORAL | Status: DC
  Administered 2022-01-18 – 2022-01-21 (×4): 10 mg via ORAL
  Filled 2022-01-18 (×4): qty 1

## 2022-01-18 MED ORDER — LEVOFLOXACIN 500 MG PO TABS
750.0000 mg | ORAL_TABLET | ORAL | Status: DC
  Administered 2022-01-18 – 2022-01-20 (×2): 750 mg via ORAL
  Filled 2022-01-18 (×2): qty 2

## 2022-01-18 MED ORDER — MAGNESIUM CITRATE PO SOLN
1.0000 | Freq: Once | ORAL | Status: AC
  Administered 2022-01-18: 1 via ORAL
  Filled 2022-01-18: qty 296

## 2022-01-18 MED ORDER — LOSARTAN POTASSIUM 50 MG PO TABS
75.0000 mg | ORAL_TABLET | Freq: Once | ORAL | Status: AC
  Administered 2022-01-18: 75 mg via ORAL
  Filled 2022-01-18: qty 1

## 2022-01-18 MED ORDER — SODIUM CHLORIDE 0.9% IV SOLUTION: Freq: Once | INTRAVENOUS | Status: AC

## 2022-01-18 NOTE — Progress Notes (Signed)
Daily Progress Note   Patient Name: Kristi Clark       Date: 01/18/2022 DOB: January 22, 1951  Age: 71 y.o. MRN#: [id number] Attending Physician: Gwynne Edinger, MD Primary Care Physician: [name], DO Admit Date: 01/13/2022  Reason for Consultation/Follow-up: Establishing goals of care  Subjective: Notes, labs, diagnostics reviewed.  In to see patient.  We discussed current status and impending blood transfusion.  She discusses feelings of needing to urinate frequently; UA is negative. Nurse is preparing to provide a soapsuds enema.  Patient states that this time she wants to have what ever care is necessary for life prolongation.  PMT will shadow Length of Stay: 5  Current Medications: Scheduled Meds:   amiodarone  400 mg Oral BID   amLODipine  10 mg Oral Daily   atorvastatin  40 mg Oral Daily   feeding supplement  237 mL Oral BID BM   hydrALAZINE  12.5 mg Oral BID   ipratropium-albuterol  3 mL Nebulization TID   isosorbide mononitrate  60 mg Oral Daily   levofloxacin  750 mg Oral Q48H   loratadine  10 mg Oral Daily   [START ON 01/19/2022] losartan  100 mg Oral Daily   metoprolol succinate  50 mg Oral Daily   mirtazapine  7.5 mg Oral QHS   multivitamin with minerals  1 tablet Oral Daily   nicotine  14 mg Transdermal Daily   pantoprazole (PROTONIX) IV  40 mg Intravenous Q12H   polyethylene glycol  34 g Oral Daily   predniSONE  40 mg Oral Q breakfast   senna  1 tablet Oral Daily    Continuous Infusions:   PRN Meds: acetaminophen **OR** acetaminophen, albuterol, ondansetron **OR** ondansetron (ZOFRAN) IV  Physical Exam Pulmonary:     Effort: Pulmonary effort is normal.  Neurological:     Mental Status: She is alert.             Vital Signs: BP (!)  162/69 (BP Location: Right Arm)   Pulse 70   Temp 98.6 F (37 C) (Oral)   Resp (!) 21   Ht '4\' 5"'$  (1.346 m)   Wt 44.4 kg   LMP  (LMP Unknown)   SpO2 100%   BMI 24.50 kg/m  SpO2: SpO2: 100 % O2 Device: O2 Device: Nasal Cannula O2 Flow  Rate: O2 Flow Rate (L/min): 3 L/min  Intake/output summary:  Intake/Output Summary (Last 24 hours) at 01/18/2022 1242 Last data filed at 01/18/2022 1147 Gross per 24 hour  Intake 240 ml  Output 650 ml  Net -410 ml   LBM: Last BM Date : 01/12/22 (Dr. aware) Baseline Weight: Weight: 41.3 kg Most recent weight: Weight: 44.4 kg         Patient Active Problem List   Diagnosis Date Noted   COPD exacerbation ([place]) 01/13/2022   Normocytic anemia 01/13/2022   Transaminitis 01/13/2022   Atrial fibrillation with RVR ([place]) 01/13/2022   Pneumonia of left lower lobe due to infectious organism 11/23/2021   COVID-19 11/15/2021   Chronic respiratory failure with hypoxia ([place]) 09/21/2021   Acute on chronic respiratory failure with hypoxia ([place]) 09/11/2021   Valvular vegetation 05/06/2021   Malnutrition of moderate degree 04/29/2021   Unintentional weight loss 04/28/2021   Congestive heart failure ([place]) 03/15/2021   Cramps, extremity 05/29/2020   Aortic atherosclerosis ([place]) 05/27/2020   Hyponatremia 02/17/2020   Hypokalemia 02/17/2020   Nicotine dependence 02/17/2020   Postherpetic neuralgia 11/24/2019   Nicotine dependence, cigarettes, w unsp disorders 10/17/2017   IBS (irritable bowel syndrome) 04/04/2017   Goals of care, counseling/discussion 10/03/2016   CAD (coronary artery disease) 10/03/2016   History of anemia 10/03/2016   Benign hypertension with chronic kidney disease 03/06/2015   Allergic rhinitis 09/03/2014   CKD (chronic kidney disease), 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   GERD (gastroesophageal reflux disease) 09/03/2014   Hyperlipidemia 09/03/2014   Chronic anemia 07/14/2014   COPD, severe ([place]) 08/08/2013   Pulmonary nodules 08/08/2013     Palliative Care Assessment & Plan    Recommendations/Plan: Continue to treat the treatable.    PMT will shadow  Code Status:    Code Status Orders  (From admission, onward)           Start     Ordered   01/13/22 0942  Do not attempt resuscitation (DNR)  Continuous       Question Answer Comment  In the event of cardiac or respiratory ARREST Do not call a "code blue"   In the event of cardiac or respiratory ARREST Do not perform Intubation, CPR, defibrillation or ACLS   In the event of cardiac or respiratory ARREST Use medication by any route, position, wound care, and other measures to relive pain and suffering. May use oxygen, suction and manual treatment of airway obstruction as needed for comfort.      01/13/22 0941           Code Status History     Date Active Date Inactive Code Status Order ID Comments User Context   01/13/2022 0722 01/13/2022 0941 Full Code [id number]  Reubin Milan, MD ED   09/17/2021 1301 09/17/2021 2034 Full Code [id number]  Dionisio David, MD Inpatient   09/11/2021 2358 09/13/2021 2322 DNR [id number]  Athena Masse, MD ED   04/28/2021 2119 04/29/2021 2202 DNR [id number]  Cox, [place], DO Inpatient   04/28/2021 1958 04/28/2021 2119 Full Code [id number]  Cox, Amy N, DO ED   02/17/2020 1326 02/20/2020 1819 DNR [id number]  Collier Bullock, MD ED   02/17/2020 1209 02/17/2020 1326 Full Code [id number]  Collier Bullock, MD ED   07/14/2014 1801 07/18/2014 1642 Full Code [id number]  Demetrios Loll, MD Inpatient       Thank you for allowing the Palliative Medicine Team to assist in the care of this patient.  Asencion Gowda, NP  Please contact Palliative Medicine Team phone at [phone] for questions and concerns.

## 2022-01-18 NOTE — Progress Notes (Signed)
PROGRESS NOTE    ROSALITA CAREY  BJY:[id number] DOB: 15-Mar-1950 DOA: 01/13/2022 PCP: [name], DO  Outpatient Specialists: cardiology ([place]), pulm ([place])    Brief Narrative:   From admission h and p  CLARIZA SICKMAN is a 71 y.o. female with medical history significant of keratosis, basal cell carcinoma, COPD, depression, GERD, hyperlipidemia, hypertension, disease, moderate CAD treated medically who is coming to the emergency department with complaints of progressively worse acute on chronic dyspnea for the past 2 weeks associated with wheezing, occasional productive cough with yellowish sputum, pleuritic chest wall pain, fatigue and decreased appetite.  She has also continued to lose weight.  No fever, chills or night sweats. No sore throat, rhinorrhea, dyspnea, wheezing or hemoptysis.  No chest pain, palpitations, diaphoresis, PND, orthopnea, but occasionally gets pitting edema of the lower extremities.  No diarrhea, occasional constipation constipation, no melena or hematochezia.  No flank pain, dysuria, frequency or hematuria.  Has stress incontinence.  No polyuria, polydipsia, polyphagia or blurred vision.    Assessment & Plan:   Principal Problem:   COPD exacerbation ([place]) Active Problems:   Acute on chronic respiratory failure with hypoxia (HCC)   Hyponatremia   GERD (gastroesophageal reflux disease)   Hyperlipidemia   Benign hypertension with chronic kidney disease   Goals of care, counseling/discussion   CAD (coronary artery disease)   Aortic atherosclerosis (HCC)   Unintentional weight loss   Malnutrition of moderate degree   Normocytic anemia   Transaminitis   Atrial fibrillation with RVR (HCC)   # Acute on chronic hypoxic respiratory failure On 2-3 l at home, here more dyspneic than normal, that has resolved and now is breathing comfortably on 3 L. - [place] o2, patient interested in humidified o2 at discharge, will query TOC  # COPD with acute  exacerbation End-stage COPD, no PE on CTA. Per pulm note hx pseudomonas. Respiratory pathogen panel neg, covid neg. Legionella and strep urine antigens neg - cont steroids, bronchodilators - stop cefepime, start levo oral. Received doxy on 11/9 and cefepime 11/10-11/14. - follow blood and sputum cultures, ngtd  # End-of-life care - palliative consulted, interested in outpt palliative - needs hospital bed - ordered  # Debility - PT advising [facility]  # A-fib New dx. Tsh wnl. Cards following - amio per cards, close outpt f/u - deferring anticoagulation for now given anemia  # CAD - hold plavix given anemia, given down-trending hgb will also hold aspirin  # Anemia, normocytic Denies bleeding or melena. Does have dysphagia/odynophagia. Iron studies show some element of iron deficiency. 8s on arrival, 7.1 today, was in the 11s 6 months ago - aspirin/plavix held - gi consulted, egd canceled yesterday, may pursue tomorrow - continue ppi bid - transfuse 1 unit given co-existing cardiac conditions  # Unintentional weight loss # dysphagia, odynophagia CT abdomen/pelvis, CT chest without acute process. EGD 2019 with erosive gastritis - PPI - egd?  # urinary retention Required I/o cath in ED and hospital day one but overnight has been able to void, not retaining. Now urinating better - bladder scan q shift, io cath as needed  # Hyponatremia Acute on chronic, here 124>132, baseline is around 130. On hctz at home but patient says hasn't been taking. Suspect siadh. Tsh wnl. Cosyntropin testing normal. W/u consistent w/ siadh though sodium has also responded to fluid resuscitation so a degree of hypovolemia contributing - hold thiazide - monitor  # Constipation May be contributing to urinary retention. Hasn't responded to  orals - cont miralax, senna - enema today  # HTN BP elevated today - resumed home losartan and hydralazine, will increase losartan to 100 and add amlod 10    DVT  prophylaxis: SCDs Code Status: dnr Family Communication: daughter-in-law updated @ bedside 11/13  Level of care: Progressive Status is: Inpatient Remains inpatient appropriate because: severity of illness   Consultants:  Cardiology, GI  Procedures: none  Antimicrobials:  Doxy>cefepime>levofloxacin   Subjective: Feeling run down, breathing stable, is coughing  Objective: Vitals:   01/18/22 0600 01/18/22 0731 01/18/22 0820 01/18/22 0910  BP: (!) 187/67 (!) 173/61  (!) 187/54  Pulse: 63 67  74  Resp: 17 (!) 22    Temp:  98.7 F (37.1 C)    TempSrc:  Oral    SpO2: 100% 99% 99%   Weight:      Height:        Intake/Output Summary (Last 24 hours) at 01/18/2022 1031 Last data filed at 01/17/2022 2200 Gross per 24 hour  Intake --  Output 450 ml  Net -450 ml   Filed Weights   01/13/22 0347 01/14/22 0455  Weight: 41.3 kg 44.4 kg    Examination:  General exam: Appears chronically ill Respiratory system: few rhonchi otherwise clear Cardiovascular system: S1 & S2 heard, RRR. No JVD, murmurs, rubs, gallops or clicks. No pedal edema. Gastrointestinal system: Abdomen is nondistended, soft and nontender. No organomegaly or masses felt. Normal bowel sounds heard. Central nervous system: Alert and oriented. No focal neurological deficits. Extremities: Symmetric 5 x 5 power. Skin: No rashes, lesions or ulcers Psychiatry: Judgement and insight appear normal. Mood & affect appropriate.     Data Reviewed: I have personally reviewed following labs and imaging studies  CBC: Recent Labs  Lab 01/13/22 0353 01/14/22 0411 01/15/22 0640 01/16/22 0458 01/17/22 0416 01/18/22 0408  WBC 8.3 10.8*  10.5 8.3 6.8 7.1 6.4  NEUTROABS 6.5 8.4*  --   --   --   --   HGB 8.1* 8.2*  8.3* 7.7* 7.2* 7.1* 7.1*  HCT 24.6* 24.9*  25.1* 23.7* 22.3* 21.7* 21.9*  MCV 87.9 87.4  86.9 86.8 87.5 88.6 88.7  PLT 319 349  346 343 378 343 657   Basic Metabolic Panel: Recent Labs  Lab  01/13/22 2226 01/14/22 0411 01/15/22 0640 01/16/22 0458 01/17/22 0416 01/18/22 0408  NA 125* 124* 126* 127* 129* 132*  K  --  3.7 4.3 4.1 4.5 4.5  CL  --  91* 92* 95* 94* 94*  CO2  --  '27 29 29 30 31  '$ GLUCOSE  --  114* 97 123* 102* 119*  BUN  --  '10 16 20 20 18  '$ CREATININE  --  0.52 0.54 0.53 0.50 0.55  CALCIUM  --  8.7* 8.8* 8.1* 8.8* 9.0  MG 1.6*  --   --   --   --   --   PHOS  --  2.7  --   --   --   --    GFR: Estimated Creatinine Clearance: 35 mL/min (by C-G formula based on SCr of 0.55 mg/dL). Liver Function Tests: Recent Labs  Lab 01/13/22 0353 01/14/22 0411  AST 51* 44*  ALT 52* 52*  ALKPHOS 96 97  BILITOT 0.6 0.5  PROT 6.0* 6.2*  ALBUMIN 2.6* 2.7*   Recent Labs  Lab 01/13/22 0353  LIPASE 67*   No results for input(s): "AMMONIA" in the last 168 hours. Coagulation Profile: No results for input(s): "INR", "PROTIME"  in the last 168 hours. Cardiac Enzymes: No results for input(s): "CKTOTAL", "CKMB", "CKMBINDEX", "TROPONINI" in the last 168 hours. BNP (last 3 results) No results for input(s): "PROBNP" in the last 8760 hours. HbA1C: No results for input(s): "HGBA1C" in the last 72 hours. CBG: No results for input(s): "GLUCAP" in the last 168 hours. Lipid Profile: No results for input(s): "CHOL", "HDL", "LDLCALC", "TRIG", "CHOLHDL", "LDLDIRECT" in the last 72 hours.  Thyroid Function Tests: No results for input(s): "TSH", "T4TOTAL", "FREET4", "T3FREE", "THYROIDAB" in the last 72 hours.  Anemia Panel: No results for input(s): "VITAMINB12", "FOLATE", "FERRITIN", "TIBC", "IRON", "RETICCTPCT" in the last 72 hours.  Urine analysis:    Component Value Date/Time   COLORURINE YELLOW (A) 01/13/2022 0352   APPEARANCEUR CLEAR (A) 01/13/2022 0352   APPEARANCEUR Clear 12/27/2021 1316   LABSPEC 1.011 01/13/2022 0352   PHURINE 6.0 01/13/2022 0352   GLUCOSEU NEGATIVE 01/13/2022 0352   HGBUR NEGATIVE 01/13/2022 0352   BILIRUBINUR NEGATIVE 01/13/2022 0352    BILIRUBINUR Negative 12/27/2021 1316   KETONESUR 5 (A) 01/13/2022 0352   PROTEINUR 30 (A) 01/13/2022 0352   NITRITE NEGATIVE 01/13/2022 0352   LEUKOCYTESUR NEGATIVE 01/13/2022 0352   Sepsis Labs: '@LABRCNTIP'$ (procalcitonin:4,lacticidven:4)  ) Recent Results (from the past 240 hour(s))  Resp Panel by RT-PCR (Flu A&B, Covid) Anterior Nasal Swab     Status: None   Collection Time: 01/13/22  3:53 AM   Specimen: Anterior Nasal Swab  Result Value Ref Range Status   SARS Coronavirus 2 by RT PCR NEGATIVE NEGATIVE Final    Comment: (NOTE) SARS-CoV-2 target nucleic acids are NOT DETECTED.  The SARS-CoV-2 RNA is generally detectable in upper respiratory specimens during the acute phase of infection. The lowest concentration of SARS-CoV-2 viral copies this assay can detect is 138 copies/mL. A negative result does not preclude SARS-Cov-2 infection and should not be used as the sole basis for treatment or other patient management decisions. A negative result may occur with  improper specimen collection/handling, submission of specimen other than nasopharyngeal swab, presence of viral mutation(s) within the areas targeted by this assay, and inadequate number of viral copies(<138 copies/mL). A negative result must be combined with clinical observations, patient history, and epidemiological information. The expected result is Negative.  Fact Sheet for Patients:  EntrepreneurPulse.com.au  Fact Sheet for Healthcare Providers:  IncredibleEmployment.be  This test is no t yet approved or cleared by the [place] FDA and  has been authorized for detection and/or diagnosis of SARS-CoV-2 by FDA under an Emergency Use Authorization (EUA). This EUA will remain  in effect (meaning this test can be used) for the duration of the COVID-19 declaration under Section 564(b)(1) of the Act, 21 U.S.C.section 360bbb-3(b)(1), unless the authorization is terminated  or  revoked sooner.       Influenza A by PCR NEGATIVE NEGATIVE Final   Influenza B by PCR NEGATIVE NEGATIVE Final    Comment: (NOTE) The Xpert Xpress SARS-CoV-2/FLU/RSV plus assay is intended as an aid in the diagnosis of influenza from Nasopharyngeal swab specimens and should not be used as a sole basis for treatment. Nasal washings and aspirates are unacceptable for Xpert Xpress SARS-CoV-2/FLU/RSV testing.  Fact Sheet for Patients: EntrepreneurPulse.com.au  Fact Sheet for Healthcare Providers: IncredibleEmployment.be  This test is not yet approved or cleared by the [place] FDA and has been authorized for detection and/or diagnosis of SARS-CoV-2 by FDA under an Emergency Use Authorization (EUA). This EUA will remain in effect (meaning this test can be used) for the  duration of the COVID-19 declaration under Section 564(b)(1) of the Act, 21 U.S.C. section 360bbb-3(b)(1), unless the authorization is terminated or revoked.  Performed at [facility], [address] [place], [place] [address]   Culture, blood (routine x 2)     Status: None   Collection Time: 01/13/22  6:30 AM   Specimen: BLOOD  Result Value Ref Range Status   Specimen Description BLOOD LEFT FOREARM  Final   Special Requests   Final    BOTTLES DRAWN AEROBIC AND ANAEROBIC Blood Culture results may not be optimal due to an inadequate volume of blood received in culture bottles   Culture   Final    NO GROWTH 5 DAYS Performed at [facility], [address] [place], [place] [address]    Report Status 01/18/2022 FINAL  Final  Culture, blood (routine x 2)     Status: None   Collection Time: 01/13/22  6:30 AM   Specimen: BLOOD  Result Value Ref Range Status   Specimen Description BLOOD RIGHT FOREARM  Final   Special Requests   Final    BOTTLES DRAWN AEROBIC AND ANAEROBIC Blood Culture adequate volume   Culture   Final    NO GROWTH 5 DAYS Performed at  [facility], [address] [place], [place] [address]    Report Status 01/18/2022 FINAL  Final  Respiratory (~20 pathogens) panel by PCR     Status: None   Collection Time: 01/14/22  2:05 AM   Specimen: Nasopharyngeal Swab; Respiratory  Result Value Ref Range Status   Adenovirus NOT DETECTED NOT DETECTED Final   Coronavirus 229E NOT DETECTED NOT DETECTED Final    Comment: (NOTE) The Coronavirus on the Respiratory Panel, DOES NOT test for the novel  Coronavirus (2019 nCoV)    Coronavirus HKU1 NOT DETECTED NOT DETECTED Final   Coronavirus NL63 NOT DETECTED NOT DETECTED Final   Coronavirus OC43 NOT DETECTED NOT DETECTED Final   Metapneumovirus NOT DETECTED NOT DETECTED Final   Rhinovirus / Enterovirus NOT DETECTED NOT DETECTED Final   Influenza A NOT DETECTED NOT DETECTED Final   Influenza B NOT DETECTED NOT DETECTED Final   Parainfluenza Virus 1 NOT DETECTED NOT DETECTED Final   Parainfluenza Virus 2 NOT DETECTED NOT DETECTED Final   Parainfluenza Virus 3 NOT DETECTED NOT DETECTED Final   Parainfluenza Virus 4 NOT DETECTED NOT DETECTED Final   Respiratory Syncytial Virus NOT DETECTED NOT DETECTED Final   Bordetella pertussis NOT DETECTED NOT DETECTED Final   Bordetella Parapertussis NOT DETECTED NOT DETECTED Final   Chlamydophila pneumoniae NOT DETECTED NOT DETECTED Final   Mycoplasma pneumoniae NOT DETECTED NOT DETECTED Final    Comment: Performed at [facility], [place] [address], [place], [place] [address]  Expectorated Sputum Assessment w Gram Stain, Rflx to Resp Cult     Status: None   Collection Time: 01/16/22  5:15 PM   Specimen: Sputum  Result Value Ref Range Status   Specimen Description SPUTUM  Final   Special Requests EXPSU  Final   Sputum evaluation   Final    THIS SPECIMEN IS ACCEPTABLE FOR SPUTUM CULTURE Performed at [facility], [address], [place], [place] [address]    Report Status 01/16/2022 FINAL  Final  Culture, Respiratory w  Gram Stain     Status: None (Preliminary result)   Collection Time: 01/16/22  5:15 PM   Specimen: SPU  Result Value Ref Range Status   Specimen Description   Final  SPUTUM Performed at [facility], [address] [place], [place] [address]    Special Requests   Final    EXPSU Reflexed from [id number] Performed at [facility], [place], [place] [address]    Gram Stain   Final    FEW SQUAMOUS EPITHELIAL CELLS PRESENT FEW WBC PRESENT, PREDOMINANTLY MONONUCLEAR MODERATE YEAST WITH PSEUDOHYPHAE RARE GRAM POSITIVE COCCI IN PAIRS    Culture   Final    TOO YOUNG TO READ Performed at [facility], [place] [address] [place], [place] [address]    Report Status PENDING  Incomplete         Radiology Studies: DG UGI W DOUBLE CM (HD BA)  Result Date: 01/17/2022 CLINICAL DATA:  71 year old female inpatient history of anemia. Team is requesting a double upper GI for further evaluation of possible ulcer EXAM: UPPER GI SERIES WITH HIGH DENSITY WITHOUT KUB TECHNIQUE: Combined double and single contrast examination was performed using effervescent crystals, high-density barium and thin liquid barium. This exam was performed by Rushie Nyhan NP, and was supervised and interpreted by Dr. Kathreen Devoid. FLUOROSCOPY: Radiation Exposure Index (as provided by the fluoroscopic device): 27.4 mGy Kerma COMPARISON:  CT abdomen pelvis dated January 14, 2022. FINDINGS: Esophagus: Normal appearance. Esophageal motility: Within normal limits. Gastroesophageal reflux: None visualized. Ingested 5m barium tablet: Passed normally Stomach: Normal appearance. No hiatal hernia. Gastric emptying: Normal. Duodenum: Normal appearance. Other:  None. IMPRESSION: 1.  No ulcer, mass, stricture seen. 2.  Unremarkable upper GI as described Electronically Signed   By: HKathreen DevoidM.D.   On: 01/17/2022 15:10        Scheduled Meds:  amiodarone  400 mg Oral BID   amLODipine  10 mg  Oral Daily   atorvastatin  40 mg Oral Daily   feeding supplement  237 mL Oral BID BM   hydrALAZINE  12.5 mg Oral BID   ipratropium-albuterol  3 mL Nebulization TID   isosorbide mononitrate  60 mg Oral Daily   loratadine  10 mg Oral Daily   [START ON 01/19/2022] losartan  100 mg Oral Daily   losartan  75 mg Oral Once   metoprolol succinate  50 mg Oral Daily   mirtazapine  7.5 mg Oral QHS   multivitamin with minerals  1 tablet Oral Daily   nicotine  14 mg Transdermal Daily   pantoprazole (PROTONIX) IV  40 mg Intravenous Q12H   polyethylene glycol  34 g Oral Daily   predniSONE  40 mg Oral Q breakfast   senna  1 tablet Oral Daily   Continuous Infusions:  ceFEPime (MAXIPIME) IV 2 g (01/17/22 2130)     LOS: 5 days     NDesma Maxim MD [facility]   If 7PM-7AM, please contact night-coverage [email or web address] Password [email or web address] 01/18/2022, 10:31 AM

## 2022-01-18 NOTE — Progress Notes (Signed)
SUBJECTIVE: Kristi Clark is a 71 y.o. female brought to the ED via EMS from home on 01/13/22 with a chief complaint of shortness of breath.  Patient with a history of COPD on 2 L continuous oxygen who has had recurrent community-acquired pneumonia over the past several weeks requiring several rounds of antibiotics and prednisone.  Seen by her PCP the day prior and prescribed another course of doxycycline.  Also endorses unintentional weight loss over the past several months.  Reports shortness of breath with productive cough of yellow sputum.  Denies fever/chills, chest pain, abdominal pain, nausea, vomiting or dizziness.     Patient was found to have new onset atrial fibrillation. Was started on amiodarone. Eliquis was held due to anemia.    Vitals:   01/18/22 0500 01/18/22 0600 01/18/22 0731 01/18/22 0820  BP: (!) 143/52 (!) 187/67 (!) 173/61   Pulse: (!) 58 63 67   Resp: 15 17 (!) 22   Temp:   98.7 F (37.1 C)   TempSrc:   Oral   SpO2: 100% 100% 99% 99%  Weight:      Height:        Intake/Output Summary (Last 24 hours) at 01/18/2022 0853 Last data filed at 01/17/2022 2200 Gross per 24 hour  Intake --  Output 550 ml  Net -550 ml    LABS: Basic Metabolic Panel: Recent Labs    01/17/22 0416 01/18/22 0408  NA 129* 132*  K 4.5 4.5  CL 94* 94*  CO2 30 31  GLUCOSE 102* 119*  BUN 20 18  CREATININE 0.50 0.55  CALCIUM 8.8* 9.0   Liver Function Tests: No results for input(s): "AST", "ALT", "ALKPHOS", "BILITOT", "PROT", "ALBUMIN" in the last 72 hours. No results for input(s): "LIPASE", "AMYLASE" in the last 72 hours. CBC: Recent Labs    01/17/22 0416 01/18/22 0408  WBC 7.1 6.4  HGB 7.1* 7.1*  HCT 21.7* 21.9*  MCV 88.6 88.7  PLT 343 368   Cardiac Enzymes: No results for input(s): "CKTOTAL", "CKMB", "CKMBINDEX", "TROPONINI" in the last 72 hours. BNP: Invalid input(s): "POCBNP" D-Dimer: No results for input(s): "DDIMER" in the last 72 hours. Hemoglobin A1C: No  results for input(s): "HGBA1C" in the last 72 hours. Fasting Lipid Panel: No results for input(s): "CHOL", "HDL", "LDLCALC", "TRIG", "CHOLHDL", "LDLDIRECT" in the last 72 hours. Thyroid Function Tests: No results for input(s): "TSH", "T4TOTAL", "T3FREE", "THYROIDAB" in the last 72 hours.  Invalid input(s): "FREET3" Anemia Panel: No results for input(s): "VITAMINB12", "FOLATE", "FERRITIN", "TIBC", "IRON", "RETICCTPCT" in the last 72 hours.   PHYSICAL EXAM General: Well developed, well nourished, in no acute distress HEENT:  Normocephalic and atramatic Neck:  No JVD.  Lungs: Clear bilaterally to auscultation and percussion. Heart: HRRR . Normal S1 and S2 without gallops or murmurs.  Abdomen: Bowel sounds are positive, abdomen soft and non-tender  Msk:  Back normal, normal gait. Normal strength and tone for age. Extremities: No clubbing, cyanosis or edema.   Neuro: Alert and oriented X 3. Psych:  Good affect, responds appropriately  TELEMETRY: sinus rhythm, HR 68 bpm  ASSESSMENT AND PLAN: Patient resting comfortably in bed. Echocardiogram results revealed normal EF 55-60%, grade II DD, mild MR, mild-mod AR. Patient currently in sinus rhythm. Continue po amiodarone. Consider Eliquis when anemia resolved. Palliative care consulted. Patient can follow up as an outpatient on Monday 01/24/22 at 11:00 am.   Principal Problem:   COPD exacerbation ([place]) Active Problems:   GERD (gastroesophageal reflux disease)   Hyperlipidemia  Benign hypertension with chronic kidney disease   Goals of care, counseling/discussion   CAD (coronary artery disease)   Hyponatremia   Aortic atherosclerosis (HCC)   Unintentional weight loss   Malnutrition of moderate degree   Acute on chronic respiratory failure with hypoxia (HCC)   Normocytic anemia   Transaminitis   Atrial fibrillation with RVR ([place])    Junelle Hashemi, FNP-C 01/18/2022 8:53 AM

## 2022-01-18 NOTE — Progress Notes (Addendum)
[facility]) Hospital Liaison note:  Notified by Patrice Paradise of request for [place] services.   Spoke to Marlane Mingle (pt's daughter) on 11.13.23 and explained Outpatient Palliative services. [name] voiced understanding and believed at the time that Outpatient Palliative services may not include enough support for the pt. She wanted to talk to her brother before making a decision about the services. This Probation officer addressed all questions and concerns at the time.  4:01pm - spoke to [facility] as this Probation officer is unsure if pt meets criteria. TOC will call back after discussion with PMT.  Please call with any outpatient palliative questions or concerns.  Thank you for the opportunity to participate in this patient's care.  Thank you, Lorelee Market, LPN [facility] Liaison [phone]

## 2022-01-18 NOTE — Care Management Important Message (Signed)
Important Message  Patient Details  Name: Kristi Clark MRN: [id number] Date of Birth: [birth year]   Medicare Important Message Given:  Yes     Dannette Barbara 01/18/2022, 10:47 AM

## 2022-01-18 NOTE — Progress Notes (Signed)
Inpatient Follow-up/Progress Note   Patient ID: Kristi Clark is a 71 y.o. female.  Overnight Events / Subjective Findings Pt resting in bed comfortably. Still w/o bm. Family at bedside. Feels bloated. Still unsure about undergoing egd tomorrow. No other acute gi complaints. [place].  Review of Systems  Constitutional:  Positive for activity change, appetite change, fatigue and unexpected weight change. Negative for chills, diaphoresis and fever.  HENT:  Positive for trouble swallowing. Negative for voice change.   Respiratory:  Positive for cough and shortness of breath. Negative for wheezing.   Cardiovascular:  Negative for chest pain, palpitations and leg swelling.  Gastrointestinal:  Positive for constipation. Negative for abdominal distention, abdominal pain, anal bleeding, blood in stool, diarrhea, nausea, rectal pain and vomiting.       + bloating  Musculoskeletal:  Negative for arthralgias and myalgias.  Skin:  Negative for color change and pallor.  Neurological:  Positive for weakness. Negative for dizziness and syncope.  Psychiatric/Behavioral:  Negative for confusion.   All other systems reviewed and are negative.    Medications  Current Facility-Administered Medications:    acetaminophen (TYLENOL) tablet 650 mg, 650 mg, Oral, Q6H PRN **OR** acetaminophen (TYLENOL) suppository 650 mg, 650 mg, Rectal, Q6H PRN, Reubin Milan, MD   albuterol (PROVENTIL) (2.5 MG/3ML) 0.083% nebulizer solution 2.5 mg, 2.5 mg, Nebulization, Q2H PRN, Wouk, Ailene Rud, MD, 2.5 mg at 01/15/22 0225   amiodarone (PACERONE) tablet 400 mg, 400 mg, Oral, BID, Neoma Laming A, MD, 400 mg at 01/18/22 0911   amLODipine (NORVASC) tablet 10 mg, 10 mg, Oral, Daily, Wouk, Ailene Rud, MD, 10 mg at 01/18/22 1153   atorvastatin (LIPITOR) tablet 40 mg, 40 mg, Oral, Daily, Reubin Milan, MD, 40 mg at 01/18/22 0910   feeding supplement (ENSURE ENLIVE / ENSURE PLUS) liquid 237 mL, 237 mL, Oral,  BID BM, Wouk, Ailene Rud, MD, 237 mL at 01/18/22 0912   hydrALAZINE (APRESOLINE) tablet 12.5 mg, 12.5 mg, Oral, BID, Wouk, Ailene Rud, MD, 12.5 mg at 01/18/22 0910   ipratropium-albuterol (DUONEB) 0.5-2.5 (3) MG/3ML nebulizer solution 3 mL, 3 mL, Nebulization, TID, Wouk, Ailene Rud, MD, 3 mL at 01/18/22 1330   isosorbide mononitrate (IMDUR) 24 hr tablet 60 mg, 60 mg, Oral, Daily, Reubin Milan, MD, 60 mg at 01/18/22 0911   levofloxacin (LEVAQUIN) tablet 750 mg, 750 mg, Oral, Q48H, Beers, Shanon Brow, RPH, 750 mg at 01/18/22 1152   loratadine (CLARITIN) tablet 10 mg, 10 mg, Oral, Daily, Wouk, Ailene Rud, MD, 10 mg at 01/18/22 0911   [START ON 01/19/2022] losartan (COZAAR) tablet 100 mg, 100 mg, Oral, Daily, Wouk, Ailene Rud, MD   magnesium citrate solution 1 Bottle, 1 Bottle, Oral, Once, Annamaria Helling, DO   metoprolol succinate (TOPROL-XL) 24 hr tablet 50 mg, 50 mg, Oral, Daily, Reubin Milan, MD, 50 mg at 01/18/22 0911   mirtazapine (REMERON) tablet 7.5 mg, 7.5 mg, Oral, QHS, Wouk, Ailene Rud, MD, 7.5 mg at 01/17/22 2122   multivitamin with minerals tablet 1 tablet, 1 tablet, Oral, Daily, Lorin Picket, [facility], 1 tablet at 01/18/22 1610   nicotine (NICODERM CQ - dosed in mg/24 hours) patch 14 mg, 14 mg, Transdermal, Daily, Reubin Milan, MD, 14 mg at 01/17/22 2126   ondansetron (ZOFRAN) tablet 4 mg, 4 mg, Oral, Q6H PRN **OR** ondansetron (ZOFRAN) injection 4 mg, 4 mg, Intravenous, Q6H PRN, Reubin Milan, MD   pantoprazole (PROTONIX) injection 40 mg, 40 mg, Intravenous, Q12H, Wouk, [employer], MD, 40 mg at 01/18/22 0907   polyethylene glycol (MIRALAX / GLYCOLAX) packet 34 g, 34 g, Oral, Daily, Wouk, Ailene Rud, MD, 34 g at 01/18/22 0910   predniSONE (DELTASONE) tablet 40 mg, 40 mg, Oral, Q breakfast, Wouk, Ailene Rud, MD, 40 mg at 01/18/22 0911   senna (SENOKOT) tablet 8.6 mg, 1 tablet, Oral, Daily, Wouk, Ailene Rud, MD, 8.6 mg at 01/18/22  0912  Facility-Administered Medications Ordered in Other Encounters:    sodium chloride flush (NS) 0.9 % injection 3 mL, 3 mL, Intravenous, Q12H, Scoggins, Amber, NP   acetaminophen **OR** acetaminophen, albuterol, ondansetron **OR** ondansetron (ZOFRAN) IV   Objective    Vitals:   01/18/22 0820 01/18/22 0910 01/18/22 1138 01/18/22 1330  BP:  (!) 187/54 (!) 162/69   Pulse:  74 70   Resp:   (!) 21   Temp:   98.6 F (37 C)   TempSrc:   Oral   SpO2: 99%  100% 96%  Weight:      Height:         Physical Exam Vitals and nursing note reviewed.  Constitutional:      General: She is not in acute distress.    Appearance: She is ill-appearing. She is not toxic-appearing or diaphoretic.     Comments: Thin, frail, chronically ill-appearing  HENT:     Head: Normocephalic and atraumatic.     Nose: Nose normal.     Mouth/Throat:     Mouth: Mucous membranes are moist.     Pharynx: Oropharynx is clear.  Eyes:     General: No scleral icterus.    Extraocular Movements: Extraocular movements intact.  Cardiovascular:     Rate and Rhythm: Normal rate and regular rhythm.     Heart sounds: Normal heart sounds. No murmur heard.    No friction rub. No gallop.  Pulmonary:     Effort: No respiratory distress.     Breath sounds: No wheezing.     Comments: Diminished breath sounds bilaterally, 3 L nasal cannula Abdominal:     General: Bowel sounds are normal. There is no distension.     Palpations: Abdomen is soft.     Tenderness: There is no abdominal tenderness. There is no guarding or rebound.  Musculoskeletal:     Cervical back: Neck supple.     Right lower leg: No edema.     Left lower leg: No edema.  Skin:    General: Skin is warm and dry.     Coloration: Skin is not jaundiced or pale.  Neurological:     General: No focal deficit present.     Mental Status: She is alert and oriented to person, place, and time. Mental status is at baseline.  Psychiatric:        Mood and Affect:  Mood normal.        Behavior: Behavior normal.        Thought Content: Thought content normal.        Judgment: Judgment normal.      Laboratory Data Recent Labs  Lab 01/13/22 0353 01/14/22 0411 01/15/22 0640 01/16/22 0458 01/17/22 0416 01/18/22 0408  WBC 8.3 10.8*  10.5   < > 6.8 7.1 6.4  HGB 8.1* 8.2*  8.3*   < > 7.2* 7.1* 7.1*  HCT 24.6* 24.9*  25.1*   < > 22.3* 21.7* 21.9*  PLT 319 349  346   < > 378 343 368  NEUTOPHILPCT 79 79  --   --   --   --  LYMPHOPCT 13 14  --   --   --   --   MONOPCT 7 7  --   --   --   --   EOSPCT 0 0  --   --   --   --    < > = values in this interval not displayed.   Recent Labs  Lab 01/13/22 0353 01/13/22 2226 01/14/22 0411 01/15/22 0640 01/16/22 0458 01/17/22 0416 01/18/22 0408  NA 124*   < > 124*   < > 127* 129* 132*  K 3.7  --  3.7   < > 4.1 4.5 4.5  CL 90*  --  91*   < > 95* 94* 94*  CO2 28  --  27   < > '29 30 31  '$ BUN 15  --  10   < > '20 20 18  '$ CREATININE 0.53  --  0.52   < > 0.53 0.50 0.55  CALCIUM 8.6*  --  8.7*   < > 8.1* 8.8* 9.0  PROT 6.0*  --  6.2*  --   --   --   --   BILITOT 0.6  --  0.5  --   --   --   --   ALKPHOS 96  --  97  --   --   --   --   ALT 52*  --  52*  --   --   --   --   AST 51*  --  44*  --   --   --   --   GLUCOSE 107*  --  114*   < > 123* 102* 119*   < > = values in this interval not displayed.   No results for input(s): "INR" in the last 168 hours.    Imaging Studies: DG UGI W DOUBLE CM (HD BA)  Result Date: 01/17/2022 CLINICAL DATA:  71 year old female inpatient history of anemia. Team is requesting a double upper GI for further evaluation of possible ulcer EXAM: UPPER GI SERIES WITH HIGH DENSITY WITHOUT KUB TECHNIQUE: Combined double and single contrast examination was performed using effervescent crystals, high-density barium and thin liquid barium. This exam was performed by Rushie Nyhan NP, and was supervised and interpreted by Dr. Kathreen Devoid. FLUOROSCOPY: Radiation Exposure Index  (as provided by the fluoroscopic device): 27.4 mGy Kerma COMPARISON:  CT abdomen pelvis dated January 14, 2022. FINDINGS: Esophagus: Normal appearance. Esophageal motility: Within normal limits. Gastroesophageal reflux: None visualized. Ingested 3m barium tablet: Passed normally Stomach: Normal appearance. No hiatal hernia. Gastric emptying: Normal. Duodenum: Normal appearance. Other:  None. IMPRESSION: 1.  No ulcer, mass, stricture seen. 2.  Unremarkable upper GI as described Electronically Signed   By: HKathreen DevoidM.D.   On: 01/17/2022 15:10    Assessment:    # Acute on chronic anemia - no signs of gib - iron studies do not support chronic blood loss - ferritin >40; tibc 283; normocytic -BUN/creatinine ratio not elevated -Hemoglobin has been stable near 7 for the last few days and she has remained hemodynamically stable -Suspect an element of delusional and frequent lab draws contributing -Upper GI study negative for signs of ulceration or mass -CKD contributing as is malnutrition -Day 4 of Plavix washout  # unintentional weight loss - suspect hypermetabolic state with severe copd - poor intake  # Oropharyngeal dysphagia - pt has most of her difficulty with transfer by description, but sounds more related to her trying to breathe while  eating simultaneously  - suspect aerophagia with bloating  # acute on chronic hypoxic respiratory failure -Currently stable on 3 L nasal cannula  # severe  #Constipation  #CKD  # CAD on plavix  # hyponatremia  # CHF  Plan:  Suspect anemia is multifactorial including CKD, malnutrition, frequent lab draws and dilutional.  She has no overt signs of gastrointestinal bleeding at this time With the patient's early satiety and abdominal bloating and discomfort family would like upper endoscopy to be performed.  All in agreement that colonoscopy would be "too much" for the patient with bowel prep and longer sedation period  Much of her  swallowing issues sound like oropharyngeal transfer or trying to breathe while eating given her significant COPD. Weight loss is likely from hypermetabolic state with COPD and decreased appetite.  Continue to hold plavix  Npo at midnight. Tentative plan for egd tomorrow- pt will let us know if she wants to proceed  Recommend transfusion of one unit as pt is 7.1 and likely would help her symptomatically as well  Continue mirtazapine Continue bid ppi  Give dose of mag citrate with additional soap suds enema to stimulate from above and below  Discussed case with hospitalist  Esophagogastroduodenoscopy with possible biopsy, control of bleeding, polypectomy, and interventions as necessary has been discussed with the patient/patient representative. Informed consent was obtained from the patient/patient representative after explaining the indication, nature, and risks of the procedure including but not limited to death, bleeding, perforation, missed neoplasm/lesions, cardiorespiratory compromise, and reaction to medications. Opportunity for questions was given and appropriate answers were provided. Patient/patient representative has verbalized understanding is amenable to undergoing the procedure.  I personally performed the service.  Management of other medical comorbidities as per primary team  Thank you for allowing [place] to participate in this patient's care. Please don't hesitate to call if any questions or concerns arise.   Annamaria Helling, DO [facility]  Portions of the record may have been created with voice recognition software. Occasional wrong-word or 'sound-a-like' substitutions may have occurred due to the inherent limitations of voice recognition software.  Read the chart carefully and recognize, using context, where substitutions may have occurred.

## 2022-01-18 NOTE — Consult Note (Signed)
Pharmacy Antibiotic Note  Kristi Clark is a 71 y.o. female admitted on 01/13/2022 with COPDE with history of Pseudomonas (respiratory culture, 10/2018). PMH significant for keratosis, basal cell carcinoma, COPD, depression, GERD, HLD, HTN, CAD. CTA negative for PE, but revealed possible interstitial pneumonia and/or pulmonary edema. Procalcitonin negative. Pharmacy has been consulted for Levofloxacin dosing.  Plan: Day 6 of antibiotics Discontinue Cefepime Initiate Levofloxacin 750 mg Q48H Continue to monitor renal function and follow culture results   Height: '4\' 5"'$  (134.6 cm) Weight: 44.4 kg (97 lb 14.2 oz) IBW/kg (Calculated) : 29.4  Temp (24hrs), Avg:98.4 F (36.9 C), Min:97.9 F (36.6 C), Max:98.7 F (37.1 C)  Recent Labs  Lab 01/13/22 0630 01/13/22 0835 01/14/22 0411 01/15/22 0640 01/16/22 0458 01/17/22 0416 01/18/22 0408  WBC  --   --  10.8*  10.5 8.3 6.8 7.1 6.4  CREATININE  --   --  0.52 0.54 0.53 0.50 0.55  LATICACIDVEN 0.7 0.9  --   --   --   --   --      Estimated Creatinine Clearance: 35 mL/min (by C-G formula based on SCr of 0.55 mg/dL).    Allergies  Allergen Reactions   Clindamycin/Lincomycin Hives   Prednisone Other (See Comments)    Psychosis   Amoxicillin Rash   Avelox [Moxifloxacin Hcl In Nacl] Rash   Codeine Sulfate Nausea Only   Penicillins Rash   Sulfa Antibiotics Rash    Antimicrobials this admission: 11/9 Doxycycline >> 11/10 11/9 Azithromycin >> 11/10 11/10 Cefepime >> 11/13 11/14 Levofloxacin >>  Dose adjustments this admission: N/A  Microbiology results: 11/9 BCx: NG final 11/10 RVPP: negative  11/12 Sputum: moderate yeast with pseudohyphae, few Stenotrophamonas maltophilia    Thank you for allowing pharmacy to be a part of this patient's care.  Gretel Acre, PharmD PGY1 Pharmacy Resident 01/18/2022 2:57 PM

## 2022-01-19 ENCOUNTER — Encounter
Admission: EM | Disposition: A | Discharge status: Home Health | Payer: Self-pay | Source: Home / Self Care | Attending: Obstetrics and Gynecology

## 2022-01-19 ENCOUNTER — Other Ambulatory Visit: Payer: Self-pay

## 2022-01-19 DIAGNOSIS — J9621 Acute and chronic respiratory failure with hypoxia: Secondary | ICD-10-CM

## 2022-01-19 DIAGNOSIS — I4891 Unspecified atrial fibrillation: Secondary | ICD-10-CM

## 2022-01-19 LAB — TYPE AND SCREEN
ABO/RH(D): A NEG
Antibody Screen: NEGATIVE
Unit division: 0

## 2022-01-19 LAB — BPAM RBC
Blood Product Expiration Date: 202311232359
ISSUE DATE / TIME: 202311141542
Unit Type and Rh: 600

## 2022-01-19 LAB — CBC
HCT: 31.8 % — ABNORMAL LOW (ref 36.0–46.0)
Hemoglobin: 10.8 g/dL — ABNORMAL LOW (ref 12.0–15.0)
MCH: 29.2 pg (ref 26.0–34.0)
MCHC: 34 g/dL (ref 30.0–36.0)
MCV: 85.9 fL (ref 80.0–100.0)
Platelets: 453 10*3/uL — ABNORMAL HIGH (ref 150–400)
RBC: 3.7 MIL/uL — ABNORMAL LOW (ref 3.87–5.11)
RDW: 16.9 % — ABNORMAL HIGH (ref 11.5–15.5)
WBC: 9.1 10*3/uL (ref 4.0–10.5)
nRBC: 0 % (ref 0.0–0.2)

## 2022-01-19 LAB — BASIC METABOLIC PANEL
Anion gap: 10 (ref 5–15)
BUN: 17 mg/dL (ref 8–23)
CO2: 33 mmol/L — ABNORMAL HIGH (ref 22–32)
Calcium: 9.6 mg/dL (ref 8.9–10.3)
Chloride: 90 mmol/L — ABNORMAL LOW (ref 98–111)
Creatinine, Ser: 0.65 mg/dL (ref 0.44–1.00)
GFR, Estimated: >60 mL/min (ref >60)
Glucose, Bld: 109 mg/dL — ABNORMAL HIGH (ref 70–99)
Potassium: 4.4 mmol/L (ref 3.5–5.1)
Sodium: 133 mmol/L — ABNORMAL LOW (ref 135–145)

## 2022-01-19 SURGERY — ESOPHAGOGASTRODUODENOSCOPY (EGD) WITH PROPOFOL
Anesthesia: General

## 2022-01-19 MED ORDER — CLOPIDOGREL BISULFATE 75 MG PO TABS
75.0000 mg | ORAL_TABLET | Freq: Every day | ORAL | Status: DC
  Administered 2022-01-19 – 2022-01-21 (×3): 75 mg via ORAL
  Filled 2022-01-19 (×3): qty 1

## 2022-01-19 NOTE — Progress Notes (Signed)
   01/19/22 1200  Clinical Encounter Type  Visited With Patient and family together  Visit Type Initial  Referral From Nurse  Consult/Referral To Chaplain   Chaplain responded to nurse consult. Chaplain provided compassionate presence and reflective listening as patient and daughter spoke about health challenges. Patient and family appreciated Chaplain visit. Chaplain services are available for follow up as needed.

## 2022-01-19 NOTE — Progress Notes (Signed)
Physical Therapy Treatment Patient Details Name: Kristi Clark MRN: [id number] DOB: 18-Nov-1950 Today's Date: 01/19/2022   History of Present Illness presented to ER secondary to progressive SOB; admitted for management of acute/chronic respiratory failure with hypoxia due to COPD exacerbation.    PT Comments    Pt was long sitting in bed upon arriving. She was on 3 L o2 throughout session. Sao2 >92%. HR stable however pt gets fatigued quickly. RR elevated into mid 30s with ambulation ~ 70 ft. Overall pt moves well but is severely deconditioned. Discussed with pt/pt's daughter in-law need for using w/c for long distances/ energy conservation techniques. Recommend HHPT at DC to maximize safety with all ADLs.      Recommendations for follow up therapy are one component of a multi-disciplinary discharge planning process, led by the attending physician.  Recommendations may be updated based on patient status, additional functional criteria and insurance authorization.  Follow Up Recommendations  Home health PT     Assistance Recommended at Discharge Frequent or constant Supervision/Assistance  Patient can return home with the following A little help with walking and/or transfers;A little help with bathing/dressing/bathroom   Equipment Recommendations  Wheelchair (measurements PT);Wheelchair cushion (measurements PT) (if pt does not already have one)       Precautions / Restrictions Precautions Precautions: Fall Restrictions Weight Bearing Restrictions: No     Mobility  Bed Mobility Overal bed mobility: Modified Independent Bed Mobility: Supine to Sit, Sit to Supine  Supine to sit: Supervision Sit to supine: Supervision     Transfers Overall transfer level: Needs assistance Equipment used: Rolling walker (2 wheels) Transfers: Sit to/from Stand Sit to Stand: Supervision     Ambulation/Gait Ambulation/Gait assistance: Supervision Gait Distance (Feet): 70 Feet Assistive  device: Rolling walker (2 wheels) Gait Pattern/deviations: Step-through pattern    General Gait Details: no LOB or safety concerns however pt is sverely limited by fatigue. HR stable but RR does elevated into mid 30s. recovers with seated rest   Stairs- pt has ramp entry into home     Balance Overall balance assessment: Needs assistance Sitting-balance support: No upper extremity supported, Feet supported Sitting balance-Leahy Scale: Good     Standing balance support: Bilateral upper extremity supported, During functional activity Standing balance-Leahy Scale: Good      Cognition Arousal/Alertness: Awake/alert Behavior During Therapy: WFL for tasks assessed/performed Overall Cognitive Status: Within Functional Limits for tasks assessed      General Comments: Pt is A and O x 4           General Comments General comments (skin integrity, edema, etc.): lengthy discussion with pt and pt's daughter in law about energy conservation and need for using W/C for longer distances. Recommend use of 3L o3 throughout.      Pertinent Vitals/Pain Pain Assessment Pain Assessment: No/denies pain     PT Goals (current goals can now be found in the care plan section) Acute Rehab PT Goals Patient Stated Goal: go home Progress towards PT goals: Progressing toward goals    Frequency    Min 2X/week      PT Plan Current plan remains appropriate       AM-PAC PT "6 Clicks" Mobility   Outcome Measure  Help needed turning from your back to your side while in a flat bed without using bedrails?: None Help needed moving from lying on your back to sitting on the side of a flat bed without using bedrails?: A Little Help needed moving to and from a  bed to a chair (including a wheelchair)?: A Little Help needed standing up from a chair using your arms (e.g., wheelchair or bedside chair)?: A Little Help needed to walk in hospital room?: A Little Help needed climbing 3-5 steps with a  railing? : A Lot 6 Click Score: 18    End of Session Equipment Utilized During Treatment: Oxygen (3L) Activity Tolerance: Patient tolerated treatment well;Patient limited by fatigue Patient left: in bed;with call bell/phone within reach;with nursing/sitter in room;with family/visitor present Nurse Communication: Mobility status PT Visit Diagnosis: Difficulty in walking, not elsewhere classified (R26.2);Muscle weakness (generalized) (M62.81)     Time: 8063-8685 PT Time Calculation (min) (ACUTE ONLY): 25 min  Charges:  $Gait Training: 8-22 mins $Therapeutic Activity: 8-22 mins                    Julaine Fusi PTA 01/19/22, 10:16 AM

## 2022-01-19 NOTE — Progress Notes (Signed)
PROGRESS NOTE    Kristi Clark  SUP:[id number] DOB: February 07, 1951 DOA: 01/13/2022 PCP: [name], DO   Assessment & Plan:   Principal Problem:   COPD exacerbation ([place]) Active Problems:   Acute on chronic respiratory failure with hypoxia (HCC)   Hyponatremia   GERD (gastroesophageal reflux disease)   Hyperlipidemia   Benign hypertension with chronic kidney disease   Goals of care, counseling/discussion   CAD (coronary artery disease)   Aortic atherosclerosis (HCC)   Unintentional weight loss   Malnutrition of moderate degree   Normocytic anemia   Transaminitis   Atrial fibrillation with RVR ([place])  Assessment and Plan:  Acute on chronic hypoxic respiratory failure: on 2-3L  at home. Continue on supplemental oxygen   COPD exacerbation: end stage. No PE on CTA chest. Per pulm note hx pseudomonas. Respiratory pathogen panel neg, covid neg. Legionella and strep urine antigens neg. Continue on levaquin, steroids, bronchodilators & encourage incentive spirometry   End-of-life care: palliative consulted, interested in outpt palliative. Hospital bed has not been delivered so far    Debility: PT recs HH  A-fib: new onset. Continue on metoprolol, amio. Deferring anticoagulation to cardio    CAD: restart home dose of plavix & continue on home dose of statin, imdur, losartan, metoprolol    Normocytic anemia: etiology unclear. Family wanted to hold off on colonoscopy.  Restart home dose of plavix as per GI    Unintentional weight loss: continue w/ nutritional supplements   Dysphagia: CT abdomen/pelvis, CT chest without acute process. EGD 2019 with erosive gastritis. Continue on PPI. Continue on dysphagia III diet as per speech    Urinary retention: required I/O cath in ED and hospital day one but overnight has been able to void, not retaining. Resolved   Acute on chronic hyponatremia: baseline around 130, currently 133. Will continue to hold thiazide. 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 Hospital bed still needs to be delivered    Consultants:  Cardi Palliative care  Procedures:   Antimicrobials: levaquin   Subjective: Pt c/o shortness of breath   Objective: Vitals:   01/18/22 2111 01/19/22 0400 01/19/22 0619 01/19/22 0830  BP: (!) 162/67   (!) 171/49  Pulse:    70  Resp:    20  Temp:  98.5 F (36.9 C)  98.1 F (36.7 C)  TempSrc:  Oral  Oral  SpO2:    100%  Weight:   44.4 kg   Height:        Intake/Output Summary (Last 24 hours) at 01/19/2022 0917 Last data filed at 01/18/2022 1922 Gross per 24 hour  Intake 699.5 ml  Output 850 ml  Net -150.5 ml   Filed Weights   01/13/22 0347 01/14/22 0455 01/19/22 0619  Weight: 41.3 kg 44.4 kg 44.4 kg    Examination:  General exam: Appears calm and comfortable  Respiratory system: diminished breath sounds b/l  Cardiovascular system: S1 & S2+. No rubs, gallops or clicks.  Gastrointestinal system: Abdomen is nondistended, soft and nontender.. Normal bowel sounds heard. Central nervous system: Alert and oriented. Moves all extremities  Psychiatry: Judgement and insight appear  normal. Flat mood and affect    Data Reviewed: I have personally reviewed following labs and imaging studies  CBC: Recent Labs  Lab 01/13/22 0353 01/14/22 0411 01/15/22 0640 01/16/22 0458 01/17/22 0416 01/18/22 0408 01/18/22 2007 01/19/22 0529  WBC 8.3 10.8*  10.5 8.3 6.8 7.1 6.4  --  9.1  NEUTROABS 6.5 8.4*  --   --   --   --   --   --   HGB 8.1* 8.2*  8.3* 7.7* 7.2* 7.1* 7.1* 9.8* 10.8*  HCT 24.6* 24.9*  25.1* 23.7* 22.3* 21.7* 21.9* 28.9* 31.8*   MCV 87.9 87.4  86.9 86.8 87.5 88.6 88.7  --  85.9  PLT 319 349  346 343 378 343 368  --  314*   Basic Metabolic Panel: Recent Labs  Lab 01/13/22 2226 01/14/22 0411 01/15/22 0640 01/16/22 0458 01/17/22 0416 01/18/22 0408 01/19/22 0529  NA 125* 124* 126* 127* 129* 132* 133*  K  --  3.7 4.3 4.1 4.5 4.5 4.4  CL  --  91* 92* 95* 94* 94* 90*  CO2  --  '27 29 29 30 31 '$ 33*  GLUCOSE  --  114* 97 123* 102* 119* 109*  BUN  --  '10 16 20 20 18 17  '$ CREATININE  --  0.52 0.54 0.53 0.50 0.55 0.65  CALCIUM  --  8.7* 8.8* 8.1* 8.8* 9.0 9.6  MG 1.6*  --   --   --   --   --   --   PHOS  --  2.7  --   --   --   --   --    GFR: Estimated Creatinine Clearance: 35 mL/min (by C-G formula based on SCr of 0.65 mg/dL). Liver Function Tests: Recent Labs  Lab 01/13/22 0353 01/14/22 0411  AST 51* 44*  ALT 52* 52*  ALKPHOS 96 97  BILITOT 0.6 0.5  PROT 6.0* 6.2*  ALBUMIN 2.6* 2.7*   Recent Labs  Lab 01/13/22 0353  LIPASE 67*   No results for input(s): "AMMONIA" in the last 168 hours. Coagulation Profile: No results for input(s): "INR", "PROTIME" in the last 168 hours. Cardiac Enzymes: No results for input(s): "CKTOTAL", "CKMB", "CKMBINDEX", "TROPONINI" in the last 168 hours. BNP (last 3 results) No results for input(s): "PROBNP" in the last 8760 hours. HbA1C: No results for input(s): "HGBA1C" in the last 72 hours. CBG: No results for input(s): "GLUCAP" in the last 168 hours. Lipid Profile: No results for input(s): "CHOL", "HDL", "LDLCALC", "TRIG", "CHOLHDL", "LDLDIRECT" in the last 72 hours. Thyroid Function Tests: No results for input(s): "TSH", "T4TOTAL", "FREET4", "T3FREE", "THYROIDAB" in the last 72 hours. Anemia Panel: No results for input(s): "VITAMINB12", "FOLATE", "FERRITIN", "TIBC", "IRON", "RETICCTPCT" in the last 72 hours. Sepsis Labs: Recent Labs  Lab 01/13/22 0630 01/13/22 0835  PROCALCITON <0.10  --   LATICACIDVEN 0.7 0.9    Recent Results (from the past 240  hour(s))  Resp Panel by RT-PCR (Flu A&B, Covid) Anterior Nasal Swab     Status: None   Collection Time: 01/13/22  3:53 AM   Specimen: Anterior Nasal Swab  Result Value Ref Range Status   SARS Coronavirus 2 by RT PCR NEGATIVE NEGATIVE Final    Comment: (NOTE) SARS-CoV-2 target nucleic acids are NOT DETECTED.  The SARS-CoV-2 RNA is generally detectable in upper respiratory specimens during the acute phase of infection. The lowest concentration of SARS-CoV-2 viral copies this assay can detect is 138 copies/mL. A negative result does not preclude SARS-Cov-2  infection and should not be used as the sole basis for treatment or other patient management decisions. A negative result may occur with  improper specimen collection/handling, submission of specimen other than nasopharyngeal swab, presence of viral mutation(s) within the areas targeted by this assay, and inadequate number of viral copies(<138 copies/mL). A negative result must be combined with clinical observations, patient history, and epidemiological information. The expected result is Negative.  Fact Sheet for Patients:  EntrepreneurPulse.com.au  Fact Sheet for Healthcare Providers:  IncredibleEmployment.be  This test is no t yet approved or cleared by the [place] FDA and  has been authorized for detection and/or diagnosis of SARS-CoV-2 by FDA under an Emergency Use Authorization (EUA). This EUA will remain  in effect (meaning this test can be used) for the duration of the COVID-19 declaration under Section 564(b)(1) of the Act, 21 U.S.C.section 360bbb-3(b)(1), unless the authorization is terminated  or revoked sooner.       Influenza A by PCR NEGATIVE NEGATIVE Final   Influenza B by PCR NEGATIVE NEGATIVE Final    Comment: (NOTE) The Xpert Xpress SARS-CoV-2/FLU/RSV plus assay is intended as an aid in the diagnosis of influenza from Nasopharyngeal swab specimens and should not be  used as a sole basis for treatment. Nasal washings and aspirates are unacceptable for Xpert Xpress SARS-CoV-2/FLU/RSV testing.  Fact Sheet for Patients: EntrepreneurPulse.com.au  Fact Sheet for Healthcare Providers: IncredibleEmployment.be  This test is not yet approved or cleared by the [place] FDA and has been authorized for detection and/or diagnosis of SARS-CoV-2 by FDA under an Emergency Use Authorization (EUA). This EUA will remain in effect (meaning this test can be used) for the duration of the COVID-19 declaration under Section 564(b)(1) of the Act, 21 U.S.C. section 360bbb-3(b)(1), unless the authorization is terminated or revoked.  Performed at [facility], [address] [place], [place] [address]   Culture, blood (routine x 2)     Status: None   Collection Time: 01/13/22  6:30 AM   Specimen: BLOOD  Result Value Ref Range Status   Specimen Description BLOOD LEFT FOREARM  Final   Special Requests   Final    BOTTLES DRAWN AEROBIC AND ANAEROBIC Blood Culture results may not be optimal due to an inadequate volume of blood received in culture bottles   Culture   Final    NO GROWTH 5 DAYS Performed at [facility], [address], [place], [place] [address]    Report Status 01/18/2022 FINAL  Final  Culture, blood (routine x 2)     Status: None   Collection Time: 01/13/22  6:30 AM   Specimen: BLOOD  Result Value Ref Range Status   Specimen Description BLOOD RIGHT FOREARM  Final   Special Requests   Final    BOTTLES DRAWN AEROBIC AND ANAEROBIC Blood Culture adequate volume   Culture   Final    NO GROWTH 5 DAYS Performed at [facility], [address] [place], [place] [address]    Report Status 01/18/2022 FINAL  Final  Respiratory (~20 pathogens) panel by PCR     Status: None   Collection Time: 01/14/22  2:05 AM   Specimen: Nasopharyngeal Swab; Respiratory  Result Value Ref Range Status    Adenovirus NOT DETECTED NOT DETECTED Final   Coronavirus 229E NOT DETECTED NOT DETECTED Final    Comment: (NOTE) The Coronavirus on the Respiratory Panel, DOES NOT test for the novel  Coronavirus (2019 nCoV)    Coronavirus HKU1 NOT DETECTED  NOT DETECTED Final   Coronavirus NL63 NOT DETECTED NOT DETECTED Final   Coronavirus OC43 NOT DETECTED NOT DETECTED Final   Metapneumovirus NOT DETECTED NOT DETECTED Final   Rhinovirus / Enterovirus NOT DETECTED NOT DETECTED Final   Influenza A NOT DETECTED NOT DETECTED Final   Influenza B NOT DETECTED NOT DETECTED Final   Parainfluenza Virus 1 NOT DETECTED NOT DETECTED Final   Parainfluenza Virus 2 NOT DETECTED NOT DETECTED Final   Parainfluenza Virus 3 NOT DETECTED NOT DETECTED Final   Parainfluenza Virus 4 NOT DETECTED NOT DETECTED Final   Respiratory Syncytial Virus NOT DETECTED NOT DETECTED Final   Bordetella pertussis NOT DETECTED NOT DETECTED Final   Bordetella Parapertussis NOT DETECTED NOT DETECTED Final   Chlamydophila pneumoniae NOT DETECTED NOT DETECTED Final   Mycoplasma pneumoniae NOT DETECTED NOT DETECTED Final    Comment: Performed at [facility], [place] [address], [place], [place] [address]  Expectorated Sputum Assessment w Gram Stain, Rflx to Resp Cult     Status: None   Collection Time: 01/16/22  5:15 PM   Specimen: Sputum  Result Value Ref Range Status   Specimen Description SPUTUM  Final   Special Requests EXPSU  Final   Sputum evaluation   Final    THIS SPECIMEN IS ACCEPTABLE FOR SPUTUM CULTURE Performed at [facility], [address], [place], [place] [address]    Report Status 01/16/2022 FINAL  Final  Culture, Respiratory w Gram Stain     Status: None (Preliminary result)   Collection Time: 01/16/22  5:15 PM   Specimen: SPU  Result Value Ref Range Status   Specimen Description   Final    SPUTUM Performed at [facility], [address] [place], [place] [address]    Special Requests   Final     EXPSU Reflexed from [id number] Performed at [facility], [address] [place], [place] [address]    Gram Stain   Final    FEW SQUAMOUS EPITHELIAL CELLS PRESENT FEW WBC PRESENT, PREDOMINANTLY MONONUCLEAR MODERATE YEAST WITH PSEUDOHYPHAE RARE GRAM POSITIVE COCCI IN PAIRS    Culture   Final    FEW STENOTROPHOMONAS MALTOPHILIA CULTURE REINCUBATED FOR BETTER GROWTH Performed at [facility], [place] [address], [place], [place] [address]    Report Status PENDING  Incomplete         Radiology Studies: DG UGI W DOUBLE CM (HD BA)  Result Date: 01/17/2022 CLINICAL DATA:  71 year old female inpatient history of anemia. Team is requesting a double upper GI for further evaluation of possible ulcer EXAM: UPPER GI SERIES WITH HIGH DENSITY WITHOUT KUB TECHNIQUE: Combined double and single contrast examination was performed using effervescent crystals, high-density barium and thin liquid barium. This exam was performed by Rushie Nyhan NP, and was supervised and interpreted by Dr. Kathreen Devoid. FLUOROSCOPY: Radiation Exposure Index (as provided by the fluoroscopic device): 27.4 mGy Kerma COMPARISON:  CT abdomen pelvis dated January 14, 2022. FINDINGS: Esophagus: Normal appearance. Esophageal motility: Within normal limits. Gastroesophageal reflux: None visualized. Ingested 72m barium tablet: Passed normally Stomach: Normal appearance. No hiatal hernia. Gastric emptying: Normal. Duodenum: Normal appearance. Other:  None. IMPRESSION: 1.  No ulcer, mass, stricture seen. 2.  Unremarkable upper GI as described Electronically Signed   By: HKathreen DevoidM.D.   On: 01/17/2022 15:10        Scheduled Meds:  amiodarone  400 mg Oral BID   amLODipine  10 mg Oral Daily   atorvastatin  40 mg Oral Daily   feeding supplement  237 mL Oral BID BM   hydrALAZINE  12.5 mg Oral BID   isosorbide mononitrate  60 mg Oral Daily   levofloxacin  750 mg Oral Q48H   loratadine  10 mg Oral Daily    losartan  100 mg Oral Daily   metoprolol succinate  50 mg Oral Daily   mirtazapine  7.5 mg Oral QHS   multivitamin with minerals  1 tablet Oral Daily   nicotine  14 mg Transdermal Daily   pantoprazole (PROTONIX) IV  40 mg Intravenous Q12H   polyethylene glycol  34 g Oral Daily   predniSONE  40 mg Oral Q breakfast   senna  1 tablet Oral Daily   Continuous Infusions:   LOS: 6 days    Time spent: 25 mins     Wyvonnia Dusky, MD [facility] Pager [phone]  If 7PM-7AM, please contact night-coverage [email or web address] 01/19/2022, 9:17 AM

## 2022-01-19 NOTE — Consult Note (Signed)
Pharmacy Antibiotic Note  Kristi Clark is a 71 y.o. female admitted on 01/13/2022 with COPDE with history of Pseudomonas (respiratory culture, 10/2018). PMH significant for keratosis, basal cell carcinoma, COPD, depression, GERD, HLD, HTN, CAD. CTA negative for PE, but revealed possible interstitial pneumonia and/or pulmonary edema. Procalcitonin negative. Pharmacy has been consulted for Levofloxacin dosing.  Plan: Day 7 of antibiotics Discontinue Cefepime Initiate Levofloxacin 750 mg Q48H Continue to monitor renal function and follow culture results   Height: '4\' 5"'$  (134.6 cm) Weight: 44.4 kg (97 lb 14.4 oz) IBW/kg (Calculated) : 29.4  Temp (24hrs), Avg:98.6 F (37 C), Min:98.5 F (36.9 C), Max:98.9 F (37.2 C)  Recent Labs  Lab 01/13/22 0630 01/13/22 0835 01/14/22 0411 01/15/22 0640 01/16/22 0458 01/17/22 0416 01/18/22 0408 01/19/22 0529  WBC  --   --    < > 8.3 6.8 7.1 6.4 9.1  CREATININE  --   --    < > 0.54 0.53 0.50 0.55 0.65  LATICACIDVEN 0.7 0.9  --   --   --   --   --   --    < > = values in this interval not displayed.     Estimated Creatinine Clearance: 35 mL/min (by C-G formula based on SCr of 0.65 mg/dL).    Allergies  Allergen Reactions   Clindamycin/Lincomycin Hives   Prednisone Other (See Comments)    Psychosis   Amoxicillin Rash   Avelox [Moxifloxacin Hcl In Nacl] Rash   Codeine Sulfate Nausea Only   Penicillins Rash   Sulfa Antibiotics Rash    Antimicrobials this admission: 11/9 Doxycycline >> 11/10 11/9 Azithromycin >> 11/10 11/10 Cefepime >> 11/13 11/14 Levofloxacin >>  Dose adjustments this admission: N/A  Microbiology results: 11/9 BCx: NG final 11/10 RVPP: negative  11/12 Sputum: moderate yeast with pseudohyphae, few Stenotrophamonas maltophilia    Thank you for allowing pharmacy to be a part of this patient's care.  Gretel Acre, PharmD PGY1 Pharmacy Resident 01/19/2022 7:11 AM

## 2022-01-19 NOTE — Progress Notes (Signed)
Daily Progress Note   Patient Name: Kristi Clark       Date: 01/19/2022 DOB: 11-27-1950  Age: 71 y.o. MRN#: [id number] Attending Physician: Wyvonnia Dusky, MD Primary Care Physician: [name], DO Admit Date: 01/13/2022  Reason for Consultation/Follow-up: Establishing goals of care  Subjective: Notes reviewed.  In to see patient.  She states she is feeling better, and that the blood transfusion helped her.  She states that she was able to move her bowels and now does not have the feeling of the need for frequent urination.  She states current plans are in place to have DME equipment set up at home and discharge home.  She states she is aware to follow back up if any signs of bleeding or other concerning symptoms are noted.  PMT will sign off.  No further recommendations.   Length of Stay: 6  Current Medications: Scheduled Meds:   amiodarone  400 mg Oral BID   amLODipine  10 mg Oral Daily   atorvastatin  40 mg Oral Daily   feeding supplement  237 mL Oral BID BM   hydrALAZINE  12.5 mg Oral BID   isosorbide mononitrate  60 mg Oral Daily   levofloxacin  750 mg Oral Q48H   loratadine  10 mg Oral Daily   losartan  100 mg Oral Daily   metoprolol succinate  50 mg Oral Daily   mirtazapine  7.5 mg Oral QHS   multivitamin with minerals  1 tablet Oral Daily   nicotine  14 mg Transdermal Daily   pantoprazole (PROTONIX) IV  40 mg Intravenous Q12H   polyethylene glycol  34 g Oral Daily   predniSONE  40 mg Oral Q breakfast   senna  1 tablet Oral Daily    Continuous Infusions:   PRN Meds: acetaminophen **OR** acetaminophen, albuterol, ondansetron **OR** ondansetron (ZOFRAN) IV  Physical Exam Pulmonary:     Effort: Pulmonary effort is normal.  Neurological:     Mental  Status: She is alert.             Vital Signs: BP (!) 144/56 (BP Location: Right Arm)   Pulse 66   Temp 98.8 F (37.1 C) (Oral)   Resp 20   Ht '4\' 5"'$  (1.346 m)   Wt 44.4 kg   LMP  (  LMP Unknown)   SpO2 99%   BMI 24.50 kg/m  SpO2: SpO2: 99 % O2 Device: O2 Device: Nasal Cannula O2 Flow Rate: O2 Flow Rate (L/min): 3 L/min  Intake/output summary:  Intake/Output Summary (Last 24 hours) at 01/19/2022 1421 Last data filed at 01/18/2022 1922 Gross per 24 hour  Intake 459.5 ml  Output 300 ml  Net 159.5 ml   LBM: Last BM Date : 01/12/22 (Dr. aware) Baseline Weight: Weight: 41.3 kg Most recent weight: Weight: 44.4 kg    Patient Active Problem List   Diagnosis Date Noted   COPD exacerbation ([place]) 01/13/2022   Normocytic anemia 01/13/2022   Transaminitis 01/13/2022   Atrial fibrillation with RVR ([place]) 01/13/2022   Pneumonia of left lower lobe due to infectious organism 11/23/2021   COVID-19 11/15/2021   Chronic respiratory failure with hypoxia ([place]) 09/21/2021   Acute on chronic respiratory failure with hypoxia ([place]) 09/11/2021   Valvular vegetation 05/06/2021   Malnutrition of moderate degree 04/29/2021   Unintentional weight loss 04/28/2021   Congestive heart failure ([place]) 03/15/2021   Cramps, extremity 05/29/2020   Aortic atherosclerosis ([place]) 05/27/2020   Hyponatremia 02/17/2020   Hypokalemia 02/17/2020   Nicotine dependence 02/17/2020   Postherpetic neuralgia 11/24/2019   Nicotine dependence, cigarettes, w unsp disorders 10/17/2017   IBS (irritable bowel syndrome) 04/04/2017   Goals of care, counseling/discussion 10/03/2016   CAD (coronary artery disease) 10/03/2016   History of anemia 10/03/2016   Benign hypertension with chronic kidney disease 03/06/2015   Allergic rhinitis 09/03/2014   CKD (chronic kidney disease), 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   GERD (gastroesophageal reflux disease) 09/03/2014   Hyperlipidemia 09/03/2014   Chronic anemia 07/14/2014   COPD, severe  ([place]) 08/08/2013   Pulmonary nodules 08/08/2013    Palliative Care Assessment & Plan     Recommendations/Plan: PMT will sign off at this time.  No further recommendations. Code Status:    Code Status Orders  (From admission, onward)           Start     Ordered   01/13/22 0942  Do not attempt resuscitation (DNR)  Continuous       Question Answer Comment  In the event of cardiac or respiratory ARREST Do not call a "code blue"   In the event of cardiac or respiratory ARREST Do not perform Intubation, CPR, defibrillation or ACLS   In the event of cardiac or respiratory ARREST Use medication by any route, position, wound care, and other measures to relive pain and suffering. May use oxygen, suction and manual treatment of airway obstruction as needed for comfort.      01/13/22 0941           Code Status History     Date Active Date Inactive Code Status Order ID Comments User Context   01/13/2022 0722 01/13/2022 0941 Full Code [id number]  Reubin Milan, MD ED   09/17/2021 1301 09/17/2021 2034 Full Code [id number]  Dionisio David, MD Inpatient   09/11/2021 2358 09/13/2021 2322 DNR [id number]  Athena Masse, MD ED   04/28/2021 2119 04/29/2021 2202 DNR [id number]  Cox, McNeal, DO Inpatient   04/28/2021 1958 04/28/2021 2119 Full Code [id number]  Cox, Amy N, DO ED   02/17/2020 1326 02/20/2020 1819 DNR [id number]  Collier Bullock, MD ED   02/17/2020 1209 02/17/2020 1326 Full Code [id number]  Collier Bullock, MD ED   07/14/2014 1801 07/18/2014 1642 Full Code [id number]  Demetrios Loll, MD Inpatient       Prognosis:  Unable to determine   Care plan was discussed with [facility]  Thank you for allowing the Palliative Medicine Team to assist in the care of this patient.   Asencion Gowda, NP  Please contact Palliative Medicine Team phone at [phone] for questions and concerns.

## 2022-01-19 NOTE — Progress Notes (Signed)
Nutrition Follow-up  DOCUMENTATION CODES:   Non-severe (moderate) malnutrition in context of chronic illness  INTERVENTION:   -Continue Ensure Enlive po BID, each supplement provides 350 kcal and 20 grams of protein -Continue Magic cup TID with meals, each supplement provides 290 kcal and 9 grams of protein  -MVI with minerals daily  NUTRITION DIAGNOSIS:   Moderate Malnutrition related to chronic illness (COPD) as evidenced by mild fat depletion, moderate muscle depletion, energy intake < 75% for > or equal to 1 month.  Ongoing  GOAL:   Patient will meet greater than or equal to 90% of their needs  Progressing   MONITOR:   PO intake, Supplement acceptance, Labs, Weight trends  REASON FOR ASSESSMENT:   Malnutrition Screening Tool    ASSESSMENT:   Pt admitted from home with SOB r/t COPD exacerbation. Also noted to have recurrent CAP and new onset afib. PMH significant for keratosis, basal cell carcinoma, COPD, depression, GERD, HLD, HTN, moderate CAD.  Reviewed I/O's: -151 ml x 24 hours and -1.4 L since admission  UOP: 850 ml x 24 hours  Pt receiving nursing care at time of visit.   Pt on a dysphagia 3 diet. Noted meal completion 75%. Pt is also taking Ensure supplements.    Diet initially held today for possible EGD. Palliative care also following for goals of care discussions. Plan to treat the treatable.   Medications reviewed and include remeron, prednisone, senokot, and miralax.   Labs reviewed: Na: 133, Mg: 1.6.    Diet Order:   Diet Order             DIET DYS 3 Room service appropriate? Yes; Fluid consistency: Thin  Diet effective now                   EDUCATION NEEDS:   Education needs have been addressed  Skin:  Skin Assessment: Reviewed RN Assessment  Last BM:  01/18/22 (type 4)  Height:   Ht Readings from Last 1 Encounters:  01/14/22 '4\' 5"'$  (1.346 m)    Weight:   Wt Readings from Last 1 Encounters:  01/19/22 44.4 kg   BMI:   Body mass index is 24.5 kg/m.  Estimated Nutritional Needs:   Kcal:  1400-1600  Protein:  70-85 grams  Fluid:  > 1.4 L    Loistine Chance, RD, LDN, [place] Registered Dietitian II Certified Diabetes Care and Education Specialist Please refer to [facility] for RD and/or RD on-call/weekend/after hours pager

## 2022-01-19 NOTE — Progress Notes (Addendum)
GI Inpatient Follow-up Note  Patient Identification: Kristi Clark is a 71 y.o. female  Subjective: Had several bowel movements after enema yesterday.  No dark stools or blood in stools noted.  No abdominal pain currently.  Eating lunch.  Has some mild bloating.  Overall feels much better after blood transfusion.  Daughter at bedside and endorses pt doing better after receiving transfusion.  Scheduled Inpatient Medications:   amiodarone  400 mg Oral BID   amLODipine  10 mg Oral Daily   atorvastatin  40 mg Oral Daily   feeding supplement  237 mL Oral BID BM   hydrALAZINE  12.5 mg Oral BID   isosorbide mononitrate  60 mg Oral Daily   levofloxacin  750 mg Oral Q48H   loratadine  10 mg Oral Daily   losartan  100 mg Oral Daily   metoprolol succinate  50 mg Oral Daily   mirtazapine  7.5 mg Oral QHS   multivitamin with minerals  1 tablet Oral Daily   nicotine  14 mg Transdermal Daily   pantoprazole (PROTONIX) IV  40 mg Intravenous Q12H   polyethylene glycol  34 g Oral Daily   predniSONE  40 mg Oral Q breakfast   senna  1 tablet Oral Daily    Continuous Inpatient Infusions:    PRN Inpatient Medications:  acetaminophen **OR** acetaminophen, albuterol, ondansetron **OR** ondansetron (ZOFRAN) IV  Review of Systems: Constitutional: +weight loss  Eyes: No changes in vision. ENT: No oral lesions, sore throat.  GI: see HPI.  Heme/Lymph: No easy bruising.  CV: No chest pain.  GU: No hematuria.  Integumentary: No rashes.  Neuro: +weakness Psych: No depression/anxiety.  Endocrine: No heat/cold intolerance.  Allergic/Immunologic: No urticaria.  Resp: + cough, SOB.  Musculoskeletal: No joint swelling.    Physical Examination: BP (!) 144/56 (BP Location: Right Arm)   Pulse 66   Temp 98.8 F (37.1 C) (Oral)   Resp 20   Ht '4\' 5"'$  (1.346 m)   Wt 44.4 kg   LMP  (LMP Unknown)   SpO2 99%   BMI 24.50 kg/m  Gen: NAD, alert and oriented x 4 HEENT: PEERLA, EOMI, Neck: supple,  no JVD or thyromegaly Chest: CTA bilaterally, no wheezes, crackles, or other adventitious sounds CV: RRR, no m/g/c/r Abd: soft, NT, ND, +BS in all four quadrants; no HSM, guarding, ridigity, or rebound tenderness Ext: no edema, well perfused with 2+ pulses, Skin: no rash or lesions noted Lymph: no LAD  Data: Lab Results  Component Value Date   WBC 9.1 01/19/2022   HGB 10.8 (L) 01/19/2022   HCT 31.8 (L) 01/19/2022   MCV 85.9 01/19/2022   PLT 453 (H) 01/19/2022   Recent Labs  Lab 01/18/22 0408 01/18/22 2007 01/19/22 0529  HGB 7.1* 9.8* 10.8*   Lab Results  Component Value Date   NA 133 (L) 01/19/2022   K 4.4 01/19/2022   CL 90 (L) 01/19/2022   CO2 33 (H) 01/19/2022   BUN 17 01/19/2022   CREATININE 0.65 01/19/2022   Lab Results  Component Value Date   ALT 52 (H) 01/14/2022   AST 44 (H) 01/14/2022   ALKPHOS 97 01/14/2022   BILITOT 0.5 01/14/2022   No results for input(s): "APTT", "INR", "PTT" in the last 168 hours. Assessment/Plan:   Ms. Terry is a 71 y.o. female addmited for worsening SOB w/ PMHx of COPD, GERD, HLD, CKD, CAD.   Acute on chronic anemia - Hgb 8.3 on admission, baseline over past 6  months was 9-10, dropped to 7.1 during hospitalization (may be frequent lab draw, malnutrition, dilutional). Responded well to 1 unit PRBCs and up to 10.8 today. No obvious GI bleeding. UGI was unremarkable. Initially planned for EGD but family prefers to hold off at this time, discussion symptom management w/ palliative. Also felt colonoscopy was "too much" and would like to hold off on colonoscopy. Okay to restart plavix from GI standpoint, recommend PCP monitor CBC closely, can notify us if has drop in Hgb as outpatient or signs of GIB. GERD-continue home PPI protonix '40mg'$  BID Weight loss- Likely multifactorial, overall eating less and less active due to COPD.  Also has difficulty breathing while eating at times. Imaging unremarkable for malignancy. Continue  mirtazapine Constipation-has miralax at home to use PRN.    Recommendations: Monitor H/h as outpatient Continue PPI and remeron at d/c  GI f/up PRN   GI to sign off. Would recommend monitoring hemoglobin at PCP.  Contact contact our office if drops or develop signs of GI bleeding.  We are available for outpatient follow-up as needed.  Please call with questions or concerns.    Ronney Asters, PA-C [place]

## 2022-01-19 NOTE — Progress Notes (Addendum)
[facility] Liaison note:  This patient has been referred to [facility] program.   Unfortunately, the patient does not meet the criteria for our outpatient program at this time. TOC aware. Daughter Alyse Low to be notified today after 4pm.  2:33pm - Daughter Alyse Low called and was notified that the pt does not meet criteria for Outpatient Palliative services. [name] voiced understanding.   Thank you, Lorelee Market, LPN [facility] Liaison [phone]

## 2022-01-19 NOTE — Progress Notes (Signed)
SUBJECTIVE: Kristi Clark is a 71 y.o. female brought to the ED via EMS from home on 01/13/22 with a chief complaint of shortness of breath.  Patient with a history of COPD on 2 L continuous oxygen who has had recurrent community-acquired pneumonia over the past several weeks requiring several rounds of antibiotics and prednisone.  Seen by her PCP the day prior and prescribed another course of doxycycline.  Also endorses unintentional weight loss over the past several months.  Reports shortness of breath with productive cough of yellow sputum.  Denies fever/chills, chest pain, abdominal pain, nausea, vomiting or dizziness.     Patient was found to have new onset atrial fibrillation. Was started on amiodarone. Eliquis was held due to anemia.   Patient reports an episode of chest tightness this morning. Shortness of breath improving.    Vitals:   01/18/22 2111 01/19/22 0400 01/19/22 0619 01/19/22 0830  BP: (!) 162/67   (!) 171/49  Pulse:    70  Resp:    20  Temp:  98.5 F (36.9 C)  98.1 F (36.7 C)  TempSrc:  Oral  Oral  SpO2:    100%  Weight:   44.4 kg   Height:        Intake/Output Summary (Last 24 hours) at 01/19/2022 1054 Last data filed at 01/18/2022 1922 Gross per 24 hour  Intake 699.5 ml  Output 850 ml  Net -150.5 ml    LABS: Basic Metabolic Panel: Recent Labs    01/18/22 0408 01/19/22 0529  NA 132* 133*  K 4.5 4.4  CL 94* 90*  CO2 31 33*  GLUCOSE 119* 109*  BUN 18 17  CREATININE 0.55 0.65  CALCIUM 9.0 9.6   Liver Function Tests: No results for input(s): "AST", "ALT", "ALKPHOS", "BILITOT", "PROT", "ALBUMIN" in the last 72 hours. No results for input(s): "LIPASE", "AMYLASE" in the last 72 hours. CBC: Recent Labs    01/18/22 0408 01/18/22 2007 01/19/22 0529  WBC 6.4  --  9.1  HGB 7.1* 9.8* 10.8*  HCT 21.9* 28.9* 31.8*  MCV 88.7  --  85.9  PLT 368  --  453*   Cardiac Enzymes: No results for input(s): "CKTOTAL", "CKMB", "CKMBINDEX", "TROPONINI" in the  last 72 hours. BNP: Invalid input(s): "POCBNP" D-Dimer: No results for input(s): "DDIMER" in the last 72 hours. Hemoglobin A1C: No results for input(s): "HGBA1C" in the last 72 hours. Fasting Lipid Panel: No results for input(s): "CHOL", "HDL", "LDLCALC", "TRIG", "CHOLHDL", "LDLDIRECT" in the last 72 hours. Thyroid Function Tests: No results for input(s): "TSH", "T4TOTAL", "T3FREE", "THYROIDAB" in the last 72 hours.  Invalid input(s): "FREET3" Anemia Panel: No results for input(s): "VITAMINB12", "FOLATE", "FERRITIN", "TIBC", "IRON", "RETICCTPCT" in the last 72 hours.   PHYSICAL EXAM General: Well developed, well nourished, in no acute distress HEENT:  Normocephalic and atramatic Neck:  No JVD.  Lungs: Clear bilaterally to auscultation and percussion. Heart: HRRR . Normal S1 and S2 without gallops or murmurs.  Abdomen: Bowel sounds are positive, abdomen soft and non-tender  Msk:  Back normal, normal gait. Normal strength and tone for age. Extremities: No clubbing, cyanosis or edema.   Neuro: Alert and oriented X 3. Psych:  Good affect, responds appropriately  TELEMETRY: sinus rhythm, HR 66 bpm  ASSESSMENT AND PLAN: Patient resting comfortably in bed, sitting up. Reports she feels better. EKG done this morning for complaint of chest tightness showed no acute changes, NSR. Echocardiogram results revealed normal EF 55-60%, grade II DD, mild MR, mild-mod  AR. Patient currently in sinus rhythm. Continue po amiodarone. Consider Eliquis when anemia resolved. Palliative care consulted. Patient can follow up as an outpatient on Monday 01/24/22 at 11:00 am.    Principal Problem:   COPD exacerbation ([place]) Active Problems:   GERD (gastroesophageal reflux disease)   Hyperlipidemia   Benign hypertension with chronic kidney disease   Goals of care, counseling/discussion   CAD (coronary artery disease)   Hyponatremia   Aortic atherosclerosis ([facility])   Unintentional weight loss   Malnutrition  of moderate degree   Acute on chronic respiratory failure with hypoxia ([facility])   Normocytic anemia   Transaminitis   Atrial fibrillation with RVR ([place])    Yutaka Holberg, FNP-C 01/19/2022 10:54 AM

## 2022-01-20 LAB — CULTURE, RESPIRATORY W GRAM STAIN

## 2022-01-20 LAB — BASIC METABOLIC PANEL
Anion gap: 7 (ref 5–15)
BUN: 14 mg/dL (ref 8–23)
CO2: 34 mmol/L — ABNORMAL HIGH (ref 22–32)
Calcium: 8.9 mg/dL (ref 8.9–10.3)
Chloride: 89 mmol/L — ABNORMAL LOW (ref 98–111)
Creatinine, Ser: 0.65 mg/dL (ref 0.44–1.00)
GFR, Estimated: >60 mL/min (ref >60)
Glucose, Bld: 90 mg/dL (ref 70–99)
Potassium: 3.8 mmol/L (ref 3.5–5.1)
Sodium: 130 mmol/L — ABNORMAL LOW (ref 135–145)

## 2022-01-20 LAB — CBC
HCT: 27.6 % — ABNORMAL LOW (ref 36.0–46.0)
Hemoglobin: 9.4 g/dL — ABNORMAL LOW (ref 12.0–15.0)
MCH: 29.2 pg (ref 26.0–34.0)
MCHC: 34.1 g/dL (ref 30.0–36.0)
MCV: 85.7 fL (ref 80.0–100.0)
Platelets: 356 10*3/uL (ref 150–400)
RBC: 3.22 MIL/uL — ABNORMAL LOW (ref 3.87–5.11)
RDW: 16.9 % — ABNORMAL HIGH (ref 11.5–15.5)
WBC: 7.7 10*3/uL (ref 4.0–10.5)
nRBC: 0 % (ref 0.0–0.2)

## 2022-01-20 MED ORDER — FUROSEMIDE 10 MG/ML IJ SOLN
40.0000 mg | Freq: Once | INTRAMUSCULAR | Status: AC
  Administered 2022-01-20: 40 mg via INTRAVENOUS
  Filled 2022-01-20: qty 4

## 2022-01-20 MED ORDER — AMIODARONE HCL 200 MG PO TABS
200.0000 mg | ORAL_TABLET | Freq: Every day | ORAL | Status: DC
  Administered 2022-01-21: 200 mg via ORAL
  Filled 2022-01-20: qty 1

## 2022-01-20 NOTE — Progress Notes (Signed)
SUBJECTIVE:  Kristi Clark is a 71 y.o. female brought to the ED via EMS from home on 01/13/22 with a chief complaint of shortness of breath.  Patient with a history of COPD on 2 L continuous oxygen who has had recurrent community-acquired pneumonia over the past several weeks requiring several rounds of antibiotics and prednisone.  Seen by her PCP the day prior and prescribed another course of doxycycline.  Also endorses unintentional weight loss over the past several months.  Reports shortness of breath with productive cough of yellow sputum.  Denies fever/chills, chest pain, abdominal pain, nausea, vomiting or dizziness.     Patient was found to have new onset atrial fibrillation. Was started on amiodarone. Eliquis was held due to anemia.    Vitals:   01/19/22 2318 01/20/22 0129 01/20/22 0411 01/20/22 0813  BP: (!) 157/63   (!) 152/63  Pulse:    61  Resp:    15  Temp:  98.5 F (36.9 C) 98.1 F (36.7 C) 98.3 F (36.8 C)  TempSrc:  Oral Oral Oral  SpO2:    99%  Weight:      Height:        Intake/Output Summary (Last 24 hours) at 01/20/2022 0843 Last data filed at 01/20/2022 0630 Gross per 24 hour  Intake --  Output 900 ml  Net -900 ml    LABS: Basic Metabolic Panel: Recent Labs    01/19/22 0529 01/20/22 0532  NA 133* 130*  K 4.4 3.8  CL 90* 89*  CO2 33* 34*  GLUCOSE 109* 90  BUN 17 14  CREATININE 0.65 0.65  CALCIUM 9.6 8.9   Liver Function Tests: No results for input(s): "AST", "ALT", "ALKPHOS", "BILITOT", "PROT", "ALBUMIN" in the last 72 hours. No results for input(s): "LIPASE", "AMYLASE" in the last 72 hours. CBC: Recent Labs    01/19/22 0529 01/20/22 0532  WBC 9.1 7.7  HGB 10.8* 9.4*  HCT 31.8* 27.6*  MCV 85.9 85.7  PLT 453* 356   Cardiac Enzymes: No results for input(s): "CKTOTAL", "CKMB", "CKMBINDEX", "TROPONINI" in the last 72 hours. BNP: Invalid input(s): "POCBNP" D-Dimer: No results for input(s): "DDIMER" in the last 72 hours. Hemoglobin  A1C: No results for input(s): "HGBA1C" in the last 72 hours. Fasting Lipid Panel: No results for input(s): "CHOL", "HDL", "LDLCALC", "TRIG", "CHOLHDL", "LDLDIRECT" in the last 72 hours. Thyroid Function Tests: No results for input(s): "TSH", "T4TOTAL", "T3FREE", "THYROIDAB" in the last 72 hours.  Invalid input(s): "FREET3" Anemia Panel: No results for input(s): "VITAMINB12", "FOLATE", "FERRITIN", "TIBC", "IRON", "RETICCTPCT" in the last 72 hours.   PHYSICAL EXAM General: Well developed, well nourished, in no acute distress HEENT:  Normocephalic and atramatic Neck:  No JVD.  Lungs: Clear bilaterally to auscultation and percussion. Heart: HRRR . Normal S1 and S2 without gallops or murmurs.  Abdomen: Bowel sounds are positive, abdomen soft and non-tender  Msk:  Back normal, normal gait. Normal strength and tone for age. Extremities: No clubbing, cyanosis or edema.   Neuro: Alert and oriented X 3. Psych:  Good affect, responds appropriately  TELEMETRY: sinus rhythm, HR 61 bpm  ASSESSMENT AND PLAN: Patient resting comfortably in bed, sitting up. Reports she feels better.  Echocardiogram results revealed normal EF 55-60%, grade II DD, mild MR, mild-mod AR. Patient currently in sinus rhythm. Continue po amiodarone. Consider Eliquis when anemia resolved. Palliative care consulted. Daughter reports hospital bed delivered to home last evening. Patient can follow up as an outpatient on Monday 01/24/22 at 11:00 am.  Principal Problem:   COPD exacerbation ([place]) Active Problems:   GERD (gastroesophageal reflux disease)   Hyperlipidemia   Benign hypertension with chronic kidney disease   Goals of care, counseling/discussion   CAD (coronary artery disease)   Hyponatremia   Aortic atherosclerosis ([facility])   Unintentional weight loss   Malnutrition of moderate degree   Acute on chronic respiratory failure with hypoxia ([facility])   Normocytic anemia   Transaminitis   Atrial fibrillation with RVR  ([place])    [name], FNP-C 01/20/2022 8:43 AM

## 2022-01-20 NOTE — Progress Notes (Signed)
PROGRESS NOTE    Kristi Clark  IWL:[id number] DOB: January 24, 1951 DOA: 01/13/2022 PCP: [name], DO   Assessment & Plan:   Principal Problem:   COPD exacerbation ([place]) Active Problems:   Acute on chronic respiratory failure with hypoxia (HCC)   Hyponatremia   GERD (gastroesophageal reflux disease)   Hyperlipidemia   Benign hypertension with chronic kidney disease   Goals of care, counseling/discussion   CAD (coronary artery disease)   Aortic atherosclerosis (HCC)   Unintentional weight loss   Malnutrition of moderate degree   Normocytic anemia   Transaminitis   Atrial fibrillation with RVR ([place])  Assessment and Plan:  Acute on chronic hypoxic respiratory failure: on 2-3L [place] at home. Continue on supplemental oxygen    COPD exacerbation: end stage. More short of breath today. No PE on CTA chest. Per pulm note hx pseudomonas. Respiratory pathogen panel neg, covid neg. Legionella and strep urine antigens neg. Continue on levaquin, steroids, bronchodilators & encourage incentive spirometry.  End-of-life care: palliative evaluated pt but pt evidently does not qualify for palliative care. Palliative care signed off    Debility: PT recs HH  A-fib: new onset. Continue on amio, metoprolol. Eliquis to be discussed outpatient w/ cardio.      CAD: continue on home dose of metoprolol, losartan, statin, plavix.     Normocytic anemia: etiology unclear. Family wants to hold off on colonoscopy and EGD. Continue on plavix. Continue on protonix '40mg'$  BID x 8 weeks then once daily as per GI. GI signed off    Unintentional weight loss: continue w/ nutritional supplements   Dysphagia:  EGD 2019 with erosive gastritis. Continue on PPI. Continue on dysphagia III diet as per speech     Urinary retention: required I/O cath in ED and hospital day one but overnight has been able to void, not retaining. Resolved   Acute on chronic hyponatremia: baseline around 130 & currently at baseline.  Labile. Will continue to hold thiazide. 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 Can likely be d/c tomorrow if breathing improved     Consultants:  Cardio Palliative care  Procedures:   Antimicrobials: levaquin   Subjective: Pt still c/o shortness of breath    Objective: Vitals:   01/19/22 2318 01/20/22 0129 01/20/22 0411 01/20/22 0813  BP: (!) 157/63   (!) 152/63  Pulse:    61  Resp:    15  Temp:  98.5 F (36.9 C) 98.1 F (36.7 C) 98.3 F (36.8 C)  TempSrc:  Oral Oral Oral  SpO2:    99%  Weight:      Height:        Intake/Output Summary (Last 24 hours) at 01/20/2022 0830 Last data filed at 01/20/2022 0630 Gross per 24 hour  Intake --  Output 900 ml  Net -900 ml   Filed Weights   01/13/22 0347 01/14/22 0455 01/19/22 0619  Weight: 41.3 kg 44.4 kg 44.4 kg    Examination:  General exam: Appears uncomfortable  Respiratory system: severely diminished breath sounds b/l Cardiovascular system: S1/S2+. No rubs or clicks  Gastrointestinal  system: Abd is sosft, NT, ND & hypoactive bowel sounds  Central nervous system: Alert and oriented. Moves all extremities  Psychiatry: Judgement and insight appears at baseline. Flat mood and affect    Data Reviewed: I have personally reviewed following labs and imaging studies  CBC: Recent Labs  Lab 01/14/22 0411 01/15/22 0640 01/16/22 0458 01/17/22 0416 01/18/22 0408 01/18/22 2007 01/19/22 0529 01/20/22 0532  WBC 10.8*  10.5   < > 6.8 7.1 6.4  --  9.1 7.7  NEUTROABS 8.4*  --   --   --   --   --   --   --    HGB 8.2*  8.3*   < > 7.2* 7.1* 7.1* 9.8* 10.8* 9.4*  HCT 24.9*  25.1*   < > 22.3* 21.7* 21.9* 28.9* 31.8* 27.6*  MCV 87.4  86.9   < > 87.5 88.6 88.7  --  85.9 85.7  PLT 349  346   < > 378 343 368  --  453* 356   < > = values in this interval not displayed.   Basic Metabolic Panel: Recent Labs  Lab 01/13/22 2226 01/14/22 0411 01/15/22 0640 01/16/22 0458 01/17/22 0416 01/18/22 0408 01/19/22 0529 01/20/22 0532  NA 125* 124*   < > 127* 129* 132* 133* 130*  K  --  3.7   < > 4.1 4.5 4.5 4.4 3.8  CL  --  91*   < > 95* 94* 94* 90* 89*  CO2  --  27   < > '29 30 31 '$ 33* 34*  GLUCOSE  --  114*   < > 123* 102* 119* 109* 90  BUN  --  10   < > '20 20 18 17 14  '$ CREATININE  --  0.52   < > 0.53 0.50 0.55 0.65 0.65  CALCIUM  --  8.7*   < > 8.1* 8.8* 9.0 9.6 8.9  MG 1.6*  --   --   --   --   --   --   --   PHOS  --  2.7  --   --   --   --   --   --    < > = values in this interval not displayed.   GFR: Estimated Creatinine Clearance: 35 mL/min (by C-G formula based on SCr of 0.65 mg/dL). Liver Function Tests: Recent Labs  Lab 01/14/22 0411  AST 44*  ALT 52*  ALKPHOS 97  BILITOT 0.5  PROT 6.2*  ALBUMIN 2.7*   No results for input(s): "LIPASE", "AMYLASE" in the last 168 hours.  No results for input(s): "AMMONIA" in the last 168 hours. Coagulation Profile: No results for input(s): "INR", "PROTIME" in the last 168 hours. Cardiac Enzymes: No results for input(s): "CKTOTAL", "CKMB", "CKMBINDEX", "TROPONINI" in the last 168 hours. BNP (last 3 results) No results for input(s): "PROBNP" in the last 8760 hours. HbA1C: No results for input(s): "HGBA1C" in the last 72 hours. CBG: No results for input(s): "GLUCAP" in the last 168 hours. Lipid Profile: No results for input(s): "CHOL", "HDL", "LDLCALC", "TRIG", "CHOLHDL", "LDLDIRECT" in the last 72 hours. Thyroid Function Tests: No results for input(s): "TSH", "T4TOTAL", "FREET4", "T3FREE", "THYROIDAB" in the last 72 hours. Anemia  Panel: No results for input(s): "VITAMINB12", "FOLATE", "FERRITIN", "TIBC", "IRON", "RETICCTPCT" in the last 72 hours. Sepsis Labs: Recent Labs  Lab 01/13/22 0835  LATICACIDVEN 0.9    Recent Results (from the past 240 hour(s))  Resp Panel by RT-PCR (Flu A&B, Covid)  Anterior Nasal Swab     Status: None   Collection Time: 01/13/22  3:53 AM   Specimen: Anterior Nasal Swab  Result Value Ref Range Status   SARS Coronavirus 2 by RT PCR NEGATIVE NEGATIVE Final    Comment: (NOTE) SARS-CoV-2 target nucleic acids are NOT DETECTED.  The SARS-CoV-2 RNA is generally detectable in upper respiratory specimens during the acute phase of infection. The lowest concentration of SARS-CoV-2 viral copies this assay can detect is 138 copies/mL. A negative result does not preclude SARS-Cov-2 infection and should not be used as the sole basis for treatment or other patient management decisions. A negative result may occur with  improper specimen collection/handling, submission of specimen other than nasopharyngeal swab, presence of viral mutation(s) within the areas targeted by this assay, and inadequate number of viral copies(<138 copies/mL). A negative result must be combined with clinical observations, patient history, and epidemiological information. The expected result is Negative.  Fact Sheet for Patients:  EntrepreneurPulse.com.au  Fact Sheet for Healthcare Providers:  IncredibleEmployment.be  This test is no t yet approved or cleared by the [place] FDA and  has been authorized for detection and/or diagnosis of SARS-CoV-2 by FDA under an Emergency Use Authorization (EUA). This EUA will remain  in effect (meaning this test can be used) for the duration of the COVID-19 declaration under Section 564(b)(1) of the Act, 21 U.S.C.section 360bbb-3(b)(1), unless the authorization is terminated  or revoked sooner.       Influenza A by PCR NEGATIVE NEGATIVE  Final   Influenza B by PCR NEGATIVE NEGATIVE Final    Comment: (NOTE) The Xpert Xpress SARS-CoV-2/FLU/RSV plus assay is intended as an aid in the diagnosis of influenza from Nasopharyngeal swab specimens and should not be used as a sole basis for treatment. Nasal washings and aspirates are unacceptable for Xpert Xpress SARS-CoV-2/FLU/RSV testing.  Fact Sheet for Patients: EntrepreneurPulse.com.au  Fact Sheet for Healthcare Providers: IncredibleEmployment.be  This test is not yet approved or cleared by the [place] FDA and has been authorized for detection and/or diagnosis of SARS-CoV-2 by FDA under an Emergency Use Authorization (EUA). This EUA will remain in effect (meaning this test can be used) for the duration of the COVID-19 declaration under Section 564(b)(1) of the Act, 21 U.S.C. section 360bbb-3(b)(1), unless the authorization is terminated or revoked.  Performed at [facility], [address] [place], [place] [address]   Culture, blood (routine x 2)     Status: None   Collection Time: 01/13/22  6:30 AM   Specimen: BLOOD  Result Value Ref Range Status   Specimen Description BLOOD LEFT FOREARM  Final   Special Requests   Final    BOTTLES DRAWN AEROBIC AND ANAEROBIC Blood Culture results may not be optimal due to an inadequate volume of blood received in culture bottles   Culture   Final    NO GROWTH 5 DAYS Performed at [facility], [address], [place],  [address]    Report Status 01/18/2022 FINAL  Final  Culture, blood (routine x 2)     Status: None   Collection Time: 01/13/22  6:30 AM   Specimen: BLOOD  Result Value Ref Range Status   Specimen Description BLOOD RIGHT FOREARM  Final   Special Requests   Final    BOTTLES DRAWN AEROBIC AND ANAEROBIC Blood Culture adequate volume   Culture   Final    NO GROWTH 5 DAYS Performed at [facility], [place],  [place]  [address]    Report Status 01/18/2022 FINAL  Final  Respiratory (~20 pathogens) panel by PCR     Status: None   Collection Time: 01/14/22  2:05 AM   Specimen: Nasopharyngeal Swab; Respiratory  Result Value Ref Range Status   Adenovirus NOT DETECTED NOT DETECTED Final   Coronavirus 229E NOT DETECTED NOT DETECTED Final    Comment: (NOTE) The Coronavirus on the Respiratory Panel, DOES NOT test for the novel  Coronavirus (2019 nCoV)    Coronavirus HKU1 NOT DETECTED NOT DETECTED Final   Coronavirus NL63 NOT DETECTED NOT DETECTED Final   Coronavirus OC43 NOT DETECTED NOT DETECTED Final   Metapneumovirus NOT DETECTED NOT DETECTED Final   Rhinovirus / Enterovirus NOT DETECTED NOT DETECTED Final   Influenza A NOT DETECTED NOT DETECTED Final   Influenza B NOT DETECTED NOT DETECTED Final   Parainfluenza Virus 1 NOT DETECTED NOT DETECTED Final   Parainfluenza Virus 2 NOT DETECTED NOT DETECTED Final   Parainfluenza Virus 3 NOT DETECTED NOT DETECTED Final   Parainfluenza Virus 4 NOT DETECTED NOT DETECTED Final   Respiratory Syncytial Virus NOT DETECTED NOT DETECTED Final   Bordetella pertussis NOT DETECTED NOT DETECTED Final   Bordetella Parapertussis NOT DETECTED NOT DETECTED Final   Chlamydophila pneumoniae NOT DETECTED NOT DETECTED Final   Mycoplasma pneumoniae NOT DETECTED NOT DETECTED Final    Comment: Performed at [facility], [place], [place], [place] [address]  Expectorated Sputum Assessment w Gram Stain, Rflx to Resp Cult     Status: None   Collection Time: 01/16/22  5:15 PM   Specimen: Sputum  Result Value Ref Range Status   Specimen Description SPUTUM  Final   Special Requests EXPSU  Final   Sputum evaluation   Final    THIS SPECIMEN IS ACCEPTABLE FOR SPUTUM CULTURE Performed at [facility], [address], [place], [place] [address]    Report Status 01/16/2022 FINAL  Final  Culture, Respiratory w Gram Stain     Status: None (Preliminary result)    Collection Time: 01/16/22  5:15 PM   Specimen: SPU  Result Value Ref Range Status   Specimen Description   Final    SPUTUM Performed at [facility], [address], [place], [place] [address]    Special Requests   Final    EXPSU Reflexed from [id number] Performed at [facility], [address] [place], [place] [address]    Gram Stain   Final    FEW SQUAMOUS EPITHELIAL CELLS PRESENT FEW WBC PRESENT, PREDOMINANTLY MONONUCLEAR MODERATE YEAST WITH PSEUDOHYPHAE RARE GRAM POSITIVE COCCI IN PAIRS    Culture   Final    FEW STENOTROPHOMONAS MALTOPHILIA SUSCEPTIBILITIES TO FOLLOW Performed at [facility], [place] [address] [place], [place] [address]    Report Status PENDING  Incomplete         Radiology Studies: No results found.      Scheduled Meds:  amiodarone  400 mg Oral BID   amLODipine  10 mg Oral Daily   atorvastatin  40 mg Oral Daily   clopidogrel  75 mg Oral Daily   feeding supplement  237 mL Oral BID BM   hydrALAZINE  12.5 mg Oral BID   isosorbide mononitrate  60 mg Oral Daily   levofloxacin  750 mg Oral Q48H   loratadine  10 mg Oral Daily   losartan  100 mg Oral Daily   metoprolol succinate  50 mg Oral Daily  mirtazapine  7.5 mg Oral QHS   multivitamin with minerals  1 tablet Oral Daily   nicotine  14 mg Transdermal Daily   pantoprazole (PROTONIX) IV  40 mg Intravenous Q12H   polyethylene glycol  34 g Oral Daily   predniSONE  40 mg Oral Q breakfast   senna  1 tablet Oral Daily   Continuous Infusions:   LOS: 7 days    Time spent: 25 mins     Wyvonnia Dusky, MD [facility] Pager [phone]  If 7PM-7AM, please contact night-coverage [email or web address] 01/20/2022, 8:30 AM

## 2022-01-20 NOTE — Consult Note (Addendum)
   [facility] Inpatient Consult   01/20/2022  JALAYIAH BIBIAN 16-Oct-1950 [id number]  [place] Organization [ACO] Patient: Medicare [facility] Liaison remote coverage review for [facility]   Primary Care Provider:  [name], DO with [facility] which is listed to provide the transition of care follow up   Patient screened for hospitalization with noted medium high risk score for unplanned readmission risk and for length of stay [7 days] and  to assess for potential [place] Management service needs for post hospital transition for care coordination.  Review of patient's electronic medical record reveals patient has with ongoing acute on chronic COPD/Pneumonia with noted new Atrial Fibrillation.  Also, reviewed palliative consult, PT/OT recommendations and inpatient [facility] team notes for community follow up transition of care needs.  Plan:  Continue to follow progress and disposition to assess for post hospital community care coordination/management needs.  Referral request for community care coordination: follow up on need.  01/21/22 145Home with HH and DME; referral request for readmission prevention support made  Of note, [place] does not replace or interfere with any arrangements made by the Inpatient Transition of Care team.  For questions contact:   Natividad Brood, RN BSN [place]  [phone] business mobile phone Toll free office [phone]  *[place]  [phone] Fax number: [phone] [email or web address]

## 2022-01-21 ENCOUNTER — Telehealth: Payer: Self-pay | Admitting: *Deleted

## 2022-01-21 DIAGNOSIS — I480 Paroxysmal atrial fibrillation: Secondary | ICD-10-CM

## 2022-01-21 DIAGNOSIS — D649 Anemia, unspecified: Secondary | ICD-10-CM

## 2022-01-21 LAB — CBC
HCT: 30.7 % — ABNORMAL LOW (ref 36.0–46.0)
Hemoglobin: 10.2 g/dL — ABNORMAL LOW (ref 12.0–15.0)
MCH: 28.7 pg (ref 26.0–34.0)
MCHC: 33.2 g/dL (ref 30.0–36.0)
MCV: 86.5 fL (ref 80.0–100.0)
Platelets: 400 10*3/uL (ref 150–400)
RBC: 3.55 MIL/uL — ABNORMAL LOW (ref 3.87–5.11)
RDW: 16.6 % — ABNORMAL HIGH (ref 11.5–15.5)
WBC: 9.8 10*3/uL (ref 4.0–10.5)
nRBC: 0 % (ref 0.0–0.2)

## 2022-01-21 LAB — BASIC METABOLIC PANEL
Anion gap: 8 (ref 5–15)
BUN: 26 mg/dL — ABNORMAL HIGH (ref 8–23)
CO2: 35 mmol/L — ABNORMAL HIGH (ref 22–32)
Calcium: 8.8 mg/dL — ABNORMAL LOW (ref 8.9–10.3)
Chloride: 87 mmol/L — ABNORMAL LOW (ref 98–111)
Creatinine, Ser: 0.72 mg/dL (ref 0.44–1.00)
GFR, Estimated: >60 mL/min (ref >60)
Glucose, Bld: 102 mg/dL — ABNORMAL HIGH (ref 70–99)
Potassium: 3.6 mmol/L (ref 3.5–5.1)
Sodium: 130 mmol/L — ABNORMAL LOW (ref 135–145)

## 2022-01-21 MED ORDER — PREDNISONE 20 MG PO TABS: 40.0000 mg | ORAL_TABLET | Freq: Every day | ORAL | 0 refills | Status: AC

## 2022-01-21 MED ORDER — AMIODARONE HCL 200 MG PO TABS: 200.0000 mg | ORAL_TABLET | Freq: Every day | ORAL | 0 refills | Status: DC

## 2022-01-21 MED ORDER — AMLODIPINE BESYLATE 10 MG PO TABS: 10.0000 mg | ORAL_TABLET | Freq: Every day | ORAL | 0 refills | Status: DC

## 2022-01-21 MED ORDER — LEVOFLOXACIN 750 MG PO TABS: 750.0000 mg | ORAL_TABLET | ORAL | 0 refills | Status: DC

## 2022-01-21 MED ORDER — PANTOPRAZOLE SODIUM 40 MG PO TBEC: 40.0000 mg | DELAYED_RELEASE_TABLET | Freq: Two times a day (BID) | ORAL | 11 refills | Status: DC

## 2022-01-21 NOTE — Progress Notes (Signed)
  Care Coordination   Note   01/21/2022 Name: Kristi Clark MRN: [id number] DOB: Dec 21, 1950  Kristi Clark is a 71 y.o. year old female who sees [name], DO for primary care. I reached out to [name] by phone today to offer care coordination services.  Ms. Villeda was given information about Care Coordination services today including:   The Care Coordination services include support from the care team which includes your Nurse Coordinator, Clinical Social Worker, or Pharmacist.  The Care Coordination team is here to help remove barriers to the health concerns and goals most important to you. Care Coordination services are voluntary, and the patient may decline or stop services at any time by request to their care team member.   Care Coordination Consent Status: Patient agreed to services and verbal consent obtained.   Follow up plan:  Telephone appointment with care coordination team member scheduled for:  02/02/2022  Encounter Outcome:  Pt. Scheduled from referral   Julian Hy, [place] Direct Dial: [phone]

## 2022-01-21 NOTE — Progress Notes (Signed)
Physical Therapy Treatment Patient Details Name: Kristi Clark MRN: [id number] DOB: 01-27-1951 Today's Date: 01/21/2022   History of Present Illness presented to ER secondary to progressive SOB; admitted for management of acute/chronic respiratory failure with hypoxia due to COPD exacerbation.    PT Comments    Pt received in bed, daughter in law at bedside. Pt completed bed mobility and transfers with supervision, gait training without AD with CGA, 1 occasion of LOB regained independently. Pt will use her Rollator upon d/c. 100% on 2L O2 at rest desating to 86% upon exertion and returning to upper 90's after seated rest break x 1 minute. All d/c concerns and questions answered/addressed for return home today with HHPT.   Recommendations for follow up therapy are one component of a multi-disciplinary discharge planning process, led by the attending physician.  Recommendations may be updated based on patient status, additional functional criteria and insurance authorization.  Follow Up Recommendations  Home health PT     Assistance Recommended at Discharge Frequent or constant Supervision/Assistance  Patient can return home with the following A little help with walking and/or transfers;A little help with bathing/dressing/bathroom   Equipment Recommendations  Wheelchair (measurements PT);Wheelchair cushion (measurements PT)    Recommendations for Other Services       Precautions / Restrictions Precautions Precautions: Fall Precaution Comments:  (2L O2 via [place] at baseline) Restrictions Weight Bearing Restrictions: No     Mobility  Bed Mobility Overal bed mobility: Modified Independent Bed Mobility: Supine to Sit, Sit to Supine     Supine to sit: Supervision Sit to supine: Supervision        Transfers Overall transfer level: Needs assistance Equipment used: None Transfers: Sit to/from Stand Sit to Stand: Supervision Stand pivot transfers: Supervision          General transfer comment: definite use of B UE's for push off    Ambulation/Gait Ambulation/Gait assistance: Min guard Gait Distance (Feet): 85 Feet Assistive device: None Gait Pattern/deviations: Step-through pattern, Decreased step length - right, Decreased step length - left, Drifts right/left       General Gait Details: LOB x 1 however pt able to regain without assist. Pt will utilize a rollator at home   Stairs Stairs:  (Pt has a ramp to enter home)           Wheelchair Mobility    Modified Rankin (Stroke Patients Only)       Balance Overall balance assessment: Needs assistance Sitting-balance support: No upper extremity supported, Feet supported Sitting balance-Leahy Scale: Good     Standing balance support: No upper extremity supported, During functional activity Standing balance-Leahy Scale: Fair Standing balance comment:  (Pt to use Rollator at home upon d/c)                            Cognition Arousal/Alertness: Awake/alert Behavior During Therapy: WFL for tasks assessed/performed Overall Cognitive Status: Within Functional Limits for tasks assessed                                 General Comments: Pt is A and O x 4        Exercises General Exercises - Lower Extremity Ankle Circles/Pumps: AROM, Both, 10 reps Long Arc Quad: AROM, Both, 10 reps Hip Flexion/Marching: AROM, Both, 10 reps    General Comments General comments (skin integrity, edema, etc.): Pt remained on 2L O2  throughout session. 100% at rest, decreased to 86% upon exertion and quickly returned to upper 90's after seated rest break x 1 minute. Education provided to pt and daughter in law regarding PLB technique, energy conservation, proper height for AD, use of 3 in 1 commode. All d/c questions answered.      Pertinent Vitals/Pain Pain Assessment Pain Assessment: No/denies pain    Home Living                          Prior Function             PT Goals (current goals can now be found in the care plan section) Acute Rehab PT Goals Patient Stated Goal: go home Progress towards PT goals: Progressing toward goals    Frequency    Min 2X/week      PT Plan Current plan remains appropriate    Co-evaluation              AM-PAC PT "6 Clicks" Mobility   Outcome Measure  Help needed turning from your back to your side while in a flat bed without using bedrails?: None Help needed moving from lying on your back to sitting on the side of a flat bed without using bedrails?: A Little Help needed moving to and from a bed to a chair (including a wheelchair)?: A Little Help needed standing up from a chair using your arms (e.g., wheelchair or bedside chair)?: A Little Help needed to walk in hospital room?: A Little Help needed climbing 3-5 steps with a railing? : A Lot 6 Click Score: 18    End of Session Equipment Utilized During Treatment: Oxygen (2L O2 via [place]) Activity Tolerance: Patient tolerated treatment well;Patient limited by fatigue Patient left: in bed;with call bell/phone within reach;with family/visitor present Nurse Communication: Mobility status PT Visit Diagnosis: Difficulty in walking, not elsewhere classified (R26.2);Muscle weakness (generalized) (M62.81)     Time: 3094-0768 PT Time Calculation (min) (ACUTE ONLY): 42 min  Charges:  $Gait Training: 8-22 mins $Therapeutic Exercise: 8-22 mins $Therapeutic Activity: 8-22 mins                    Mikel Cella, PTA    Josie Dixon 01/21/2022, 11:37 AM

## 2022-01-21 NOTE — Care Management Important Message (Signed)
Important Message  Patient Details  Name: Kristi Clark MRN: [id number] Date of Birth: June 07, 1950   Medicare Important Message Given:  Yes     Dannette Barbara 01/21/2022, 11:44 AM

## 2022-01-21 NOTE — Discharge Summary (Signed)
Physician Discharge Summary  Kristi Clark:[id number] DOB: [birth year] DOA: 01/13/2022  PCP: [name], DO  Admit date: 01/13/2022 Discharge date: 01/21/2022  Admitted From: home  Disposition:  home   Recommendations for Outpatient Follow-up:  Follow up with PCP in 1-2 weeks F/u w/ cardio, Dr. Humphrey Rolls, on 01/24/22 at 11:00AM  Home Health: Equipment/Devices: chronically on 2-3L [place]   Discharge Condition: stable  CODE STATUS: DNR  Diet recommendation: Heart Healthy    Brief/Interim Summary: HPI was taken from Dr. Olevia Bowens: Kristi Clark is a 71 y.o. female with medical history significant of keratosis, basal cell carcinoma, COPD, depression, GERD, hyperlipidemia, hypertension, disease, moderate CAD treated medically who is coming to the emergency department with complaints of progressively worse acute on chronic dyspnea for the past 2 weeks associated with wheezing, occasional productive cough with yellowish sputum, pleuritic chest wall pain, fatigue and decreased appetite.  She has also continued to lose weight.  No fever, chills or night sweats. No sore throat, rhinorrhea, dyspnea, wheezing or hemoptysis.  No chest pain, palpitations, diaphoresis, PND, orthopnea, but occasionally gets pitting edema of the lower extremities.  No diarrhea, occasional constipation constipation, no melena or hematochezia.  No flank pain, dysuria, frequency or hematuria.  Has stress incontinence.  No polyuria, polydipsia, polyphagia or blurred vision.   ED course: Initial vital signs were temperature 98.1 degrees, pulse, respiration 20, BP 127 mm of nasal cannula oxygen patient received doxycycline 100 mg IVPB, 201,000 mL of normal saline bolus.   Lab work: Her urinalysis showed ketonuria 5 and proteinuria 30 mg/dL.  Troponin x2 and lactic acid x2 negative.  Normal procalcitonin.  Negative coronavirus and influenza PCR.  CBC with a white count 8.3, hemoglobin 9.1 g/dL platelets 319.  Her most recent  hemoglobin level was 9.1 g/dL on 09/21/2021.  CMP showed a sodium 124, potassium 3.7, chloride 90 and CO2 28 mmol/L.  Renal function was normal.  Calcium is normal after correction.  Total protein 6.0, albumin 2.6 g/dL.  AST 51 and ALT 52 units/L.  Normal alk phos and bilirubin level.  Lipase was 67.   Imaging: Severe chronic lung disease with suspected new small pleural effusions and increased confluence of opacity of the left lung base questionable for acute infectious exacerbation.   As per Dr. Jimmye Norman 11/15-11/17/23: By the time I saw the pt, pt was already back on home oxygen level at 3L [place], as pt usually uses 2-3L chronically at home. Pt was continue on po levaquin, steroids and bronchodilators at d/c to complete the course. Of note, pt will f/u w/ cardio, Dr. Humphrey Rolls, on 01/24/22 at 11:00AM for new onset a. fib. Pt is taking amiodarone, metoprolol as per cardio & anticoagulation will be discussed as an outpatient w/ cardio. Also, GI wanted to EGD and colonoscopy for acute on chronic anemia but pt and pt's family refused. Pt was placed on pantoprazole 68m bid x 8 weeks, then daily there after as per GI. For more information or for events that occurred prior to 01/19/22, please see previous progress/consult notes.    Discharge Diagnoses:  Principal Problem:   COPD exacerbation ([facility] Active Problems:   Acute on chronic respiratory failure with hypoxia (HCC)   Hyponatremia   GERD (gastroesophageal reflux disease)   Hyperlipidemia   Benign hypertension with chronic kidney disease   Goals of care, counseling/discussion   CAD (coronary artery disease)   Aortic atherosclerosis ([facility]   Unintentional weight loss   Malnutrition of moderate degree   Normocytic  anemia   Transaminitis   Atrial fibrillation with RVR (HCC)  Acute on chronic hypoxic respiratory failure: on 2-3L [place] at home. Continue on supplemental oxygen    COPD exacerbation: end stage. improved respiratory status today. No PE on CTA  chest. Per pulm note hx pseudomonas. Respiratory pathogen panel neg, covid neg. Legionella and strep urine antigens neg. Continue on levaquin, steroids, bronchodilators & encourage incentive spirometry.   End-of-life care: palliative evaluated pt but pt evidently does not qualify for palliative care as per CM. Palliative care signed off    Debility: PT recs HH   A-fib: new onset. Continue on amio, metoprolol. Eliquis to be discussed outpatient w/ cardio.     CAD: continue on home dose of metoprolol, losartan, statin, plavix.     Normocytic anemia: etiology unclear. Family wants to hold off on colonoscopy and EGD. Continue on plavix. Continue on protonix 43m BID x 8 weeks then once daily as per GI. GI signed off    Unintentional weight loss: continue w/ nutritional supplements    Dysphagia:  EGD 2019 with erosive gastritis. Continue on PPI. Continue on dysphagia III diet as per speech     Urinary retention: required I/O cath in ED and hospital day one but overnight has been able to void, not retaining. Resolved   Acute on chronic hyponatremia: baseline around 130 & currently at baseline. Labile. Will continue to hold thiazide. 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 F/u w/ cardio, Dr. KHumphrey Rolls on 01/24/22 at 11:00AM   Increase activity slowly   Complete by: As directed       Allergies as of 01/21/2022       Reactions   Clindamycin/lincomycin Hives   Prednisone Other (See Comments)   Psychosis   Amoxicillin Rash   Avelox [moxifloxacin Hcl In Nacl] Rash   Codeine Sulfate Nausea Only   Penicillins Rash   Sulfa Antibiotics Rash        Medication List     STOP  taking these medications    doxycycline 100 MG tablet Commonly known as: VIBRA-TABS       TAKE these medications    albuterol 108 (90 Base) MCG/ACT inhaler Commonly known as: VENTOLIN HFA Inhale 2 puffs into the lungs every 6 (six) hours as needed for wheezing or shortness of breath.   albuterol (2.5 MG/3ML) 0.083% nebulizer solution Commonly known as: PROVENTIL Take 3 mLs (2.5 mg total) by nebulization every 6 (six) hours as needed for wheezing or shortness of breath.   amiodarone 200 MG tablet Commonly known as: PACERONE Take 1 tablet (200 mg total) by mouth daily. Start taking on: January 22, 2022   amLODipine 10 MG tablet Commonly known as: NORVASC Take 1 tablet (10 mg total) by mouth daily. Start taking on: January 22, 2022   aspirin 81 MG tablet Take 81 mg by mouth daily.   atorvastatin 40 MG tablet Commonly known as: LIPITOR Take 1 tablet (40 mg total) by mouth daily.   Blood Pressure Monitor/Wrist Kit Take Blood pressure as needed, Dx: I12.9   CENTRUM SILVER ADULT 50+ PO Take 1 tablet by mouth daily.  PreserVision AREDS 2+Multi Vit Caps Take 1 capsule by mouth at bedtime.   cetirizine 10 MG tablet Commonly known as: ZYRTEC Take 10 mg by mouth daily as needed for allergies.   clopidogrel 75 MG tablet Commonly known as: PLAVIX Take 1 tablet (75 mg total) by mouth daily.   fluticasone-salmeterol 250-50 MCG/ACT Aepb Commonly known as: Advair Diskus Inhale 1 puff into the lungs in the morning and at bedtime.   hydrALAZINE 25 MG tablet Commonly known as: APRESOLINE Take 0.5 tablets (12.5 mg total) by mouth 2 (two) times daily.   hydrochlorothiazide 12.5 MG capsule Commonly known as: MICROZIDE Take 1 capsule (12.5 mg total) by mouth daily as needed. What changed: reasons to take this   isosorbide mononitrate 60 MG 24 hr tablet Commonly known as: IMDUR Take 1 tablet (60 mg total) by mouth 2 (two) times daily.   levofloxacin 750 MG  tablet Commonly known as: LEVAQUIN Take 1 tablet (750 mg total) by mouth every other day for 3 doses. Start taking on: January 22, 2022   losartan 25 MG tablet Commonly known as: COZAAR Take 1 tablet (25 mg total) by mouth daily.   metoprolol succinate 50 MG 24 hr tablet Commonly known as: TOPROL-XL Take 1 tablet (50 mg total) by mouth daily. Take with or immediately following a meal.   mirtazapine 45 MG tablet Commonly known as: REMERON Take 1 tablet (45 mg total) by mouth at bedtime.   ondansetron 4 MG tablet Commonly known as: Zofran Take 1 tablet (4 mg total) by mouth every 8 (eight) hours as needed for nausea or vomiting.   pantoprazole 40 MG tablet Commonly known as: Protonix Take 1 tablet (40 mg total) by mouth 2 (two) times daily.   polyethylene glycol powder 17 GM/SCOOP powder Commonly known as: GLYCOLAX/MIRALAX Take 17 g by mouth daily as needed for moderate constipation.   predniSONE 20 MG tablet Commonly known as: DELTASONE Take 2 tablets (40 mg total) by mouth daily with breakfast for 5 days. Start taking on: January 22, 2022   PROBIOTIC-10 PO Take 1 tablet by mouth every other day. Every other day   saline Gel Place 1 Application into both nostrils every 4 (four) hours as needed.   tiotropium 18 MCG inhalation capsule Commonly known as: SPIRIVA Place 1 capsule (18 mcg total) into inhaler and inhale daily.               Durable Medical Equipment  (From admission, onward)           Start     Ordered   01/20/22 1103  For home use only DME Bedside commode  Once       Question:  Patient needs a bedside commode to treat with the following condition  Answer:  Generalized weakness   01/20/22 1102   01/17/22 1103  For home use only DME Hospital bed  Once       Question Answer Comment  Length of Need Lifetime   Bed type Semi-electric      01/17/22 1102   01/16/22 1400  For home use only DME oxygen  Once       Comments: humidified  Question  Answer Comment  Length of Need Lifetime   Mode or (Route) Nasal cannula   Liters per Minute 3   Oxygen delivery system Other see comments      01/16/22 1400            Follow-up Information     Devona Konig  A, MD Follow up on 01/24/2022.   Specialties: Internal Medicine, Pulmonary Disease Why: at 11:00 AM Contact information: [place] [address] [phone]         Park Liter P, DO Follow up.   Specialty: Family Medicine Why: F/u in 1-2 weeks Contact information: [place] [address] [phone]                Allergies  Allergen Reactions   Clindamycin/Lincomycin Hives   Prednisone Other (See Comments)    Psychosis   Amoxicillin Rash   Avelox [Moxifloxacin Hcl In Nacl] Rash   Codeine Sulfate Nausea Only   Penicillins Rash   Sulfa Antibiotics Rash    Consultations: GI  Cardio  Palliative care    Procedures/Studies: DG UGI W DOUBLE CM (HD BA)  Result Date: 01/17/2022 CLINICAL DATA:  71 year old female inpatient history of anemia. Team is requesting a double upper GI for further evaluation of possible ulcer EXAM: UPPER GI SERIES WITH HIGH DENSITY WITHOUT KUB TECHNIQUE: Combined double and single contrast examination was performed using effervescent crystals, high-density barium and thin liquid barium. This exam was performed by Rushie Nyhan NP, and was supervised and interpreted by Dr. Kathreen Devoid. FLUOROSCOPY: Radiation Exposure Index (as provided by the fluoroscopic device): 27.4 mGy Kerma COMPARISON:  CT abdomen pelvis dated January 14, 2022. FINDINGS: Esophagus: Normal appearance. Esophageal motility: Within normal limits. Gastroesophageal reflux: None visualized. Ingested 14m barium tablet: Passed normally Stomach: Normal appearance. No hiatal hernia. Gastric emptying: Normal. Duodenum: Normal appearance. Other:  None. IMPRESSION: 1.  No ulcer, mass, stricture seen. 2.  Unremarkable upper GI as described  Electronically Signed   By: HKathreen DevoidM.D.   On: 01/17/2022 15:10   ECHOCARDIOGRAM COMPLETE  Result Date: 01/17/2022    ECHOCARDIOGRAM REPORT   Patient Name:   Kristi SANJURJODate of Exam: 01/14/2022 Medical Rec #:  [id number]         Height:       53.0 in Accession #:    [id number]        Weight:       97.9 lb Date of Birth:  9February 10, 1952         BSA:          1.259 m Patient Age:    89years           BP:           178/63 mmHg Patient Gender: F                  HR:           84 bpm. Exam Location:  [facility] Procedure: 2D Echo, Color Doppler and Cardiac Doppler Indications:     R94.31 Abnormal ECG  History:         Patient has no prior history of Echocardiogram examinations and                  Patient has prior history of Echocardiogram examinations, most                  recent 05/11/2021. COPD; Risk Factors:Hypertension, Dyslipidemia                  and Current Smoker.  Sonographer:     JCharmayne SheerReferring Phys:  [id number] SCOGGINS Diagnosing Phys: [place] 1. Left ventricular ejection fraction, by estimation, is 55 to 60%. The left ventricle has  normal function. The left ventricle has no regional wall motion abnormalities. The left ventricular internal cavity size was mildly dilated. There is mild left ventricular hypertrophy. Left ventricular diastolic parameters are consistent with Grade II diastolic dysfunction (pseudonormalization).  2. Right ventricular systolic function is moderately reduced. The right ventricular size is mildly enlarged. Mildly increased right ventricular wall thickness.  3. Left atrial size was moderately dilated.  4. Right atrial size was moderately dilated.  5. The mitral valve is grossly normal. Mild mitral valve regurgitation.  6. The aortic valve is calcified. Aortic valve regurgitation is mild to moderate. Aortic valve sclerosis/calcification is present, without any evidence of aortic stenosis. FINDINGS  Left Ventricle: Left ventricular ejection  fraction, by estimation, is 55 to 60%. The left ventricle has normal function. The left ventricle has no regional wall motion abnormalities. The left ventricular internal cavity size was mildly dilated. There is  mild left ventricular hypertrophy. Left ventricular diastolic parameters are consistent with Grade II diastolic dysfunction (pseudonormalization). Right Ventricle: The right ventricular size is mildly enlarged. Mildly increased right ventricular wall thickness. Right ventricular systolic function is moderately reduced. Left Atrium: Left atrial size was moderately dilated. Right Atrium: Right atrial size was moderately dilated. Pericardium: There is no evidence of pericardial effusion. Mitral Valve: The mitral valve is grossly normal. Mild mitral valve regurgitation. Tricuspid Valve: The tricuspid valve is normal in structure. Tricuspid valve regurgitation is mild. Aortic Valve: The aortic valve is calcified. Aortic valve regurgitation is mild to moderate. Aortic valve sclerosis/calcification is present, without any evidence of aortic stenosis. Aortic valve mean gradient measures 3.0 mmHg. Aortic valve peak gradient measures 4.7 mmHg. Aortic valve area, by VTI measures 1.21 cm. Pulmonic Valve: The pulmonic valve was normal in structure. Pulmonic valve regurgitation is trivial. Aorta: The aortic root, ascending aorta and aortic arch are all structurally normal, with no evidence of dilitation or obstruction. IAS/Shunts: No atrial level shunt detected by color flow Doppler.  LEFT VENTRICLE PLAX 2D LVIDd:         2.80 cm   Diastology LVIDs:         2.00 cm   LV e' medial:    9.57 cm/s LV PW:         1.10 cm   LV E/e' medial:  11.8 LV IVS:        0.70 cm   LV e' lateral:   13.20 cm/s LVOT diam:     1.40 cm   LV E/e' lateral: 8.6 LV SV:         29 LV SV Index:   23 LVOT Area:     1.54 cm  RIGHT VENTRICLE RV Basal diam:  3.80 cm RV S prime:     11.60 cm/s TAPSE (M-mode): 1.8 cm LEFT ATRIUM             Index         RIGHT ATRIUM           Index LA diam:        3.80 cm 3.02 cm/m   RA Area:     12.00 cm LA Vol (A2C):   36.1 ml 28.67 ml/m  RA Volume:   25.80 ml  20.49 ml/m LA Vol (A4C):   50.5 ml 40.11 ml/m LA Biplane Vol: 44.0 ml 34.95 ml/m  AORTIC VALVE                    PULMONIC VALVE AV Area (Vmax):    1.27 cm  PV Vmax:       1.04 m/s AV Area (Vmean):   1.24 cm     PV Vmean:      72.600 cm/s AV Area (VTI):     1.21 cm     PV VTI:        0.199 m AV Vmax:           108.00 cm/s  PV Peak grad:  4.3 mmHg AV Vmean:          81.500 cm/s  PV Mean grad:  2.0 mmHg AV VTI:            0.240 m AV Peak Grad:      4.7 mmHg AV Mean Grad:      3.0 mmHg LVOT Vmax:         89.40 cm/s LVOT Vmean:        65.900 cm/s LVOT VTI:          0.189 m LVOT/AV VTI ratio: 0.79  AORTA Ao Root diam: 2.30 cm MITRAL VALVE                TRICUSPID VALVE MV Area (PHT): 5.09 cm     TR Peak grad:   49.3 mmHg MV Decel Time: 149 msec     TR Vmax:        351.00 cm/s MV E velocity: 113.00 cm/s MV A velocity: 55.00 cm/s   SHUNTS MV E/A ratio:  2.05         Systemic VTI:  0.19 m                             Systemic Diam: 1.40 cm Neoma Laming Electronically signed by Neoma Laming Signature Date/Time: 01/17/2022/11:13:54 AM    Final    CT ABDOMEN PELVIS W CONTRAST  Result Date: 01/14/2022 CLINICAL DATA:  Dysphagia.  Unintended weight loss. EXAM: CT ABDOMEN AND PELVIS WITH CONTRAST TECHNIQUE: Multidetector CT imaging of the abdomen and pelvis was performed using the standard protocol following bolus administration of intravenous contrast. RADIATION DOSE REDUCTION: This exam was performed according to the departmental dose-optimization program which includes automated exposure control, adjustment of the mA and/or kV according to patient size and/or use of iterative reconstruction technique. CONTRAST:  131m OMNIPAQUE IOHEXOL 300 MG/ML  SOLN COMPARISON:  None Available. FINDINGS: Lower chest: Bilateral pleural effusions. Severe interstitial lung changes.  Minimal pericardial effusion. Hepatobiliary: No focal liver abnormality is seen. Status post cholecystectomy. No biliary dilatation. Pancreas: Unremarkable. No pancreatic ductal dilatation or surrounding inflammatory changes. Spleen: Normal in size without focal abnormality. Adrenals/Urinary Tract: Adrenal glands are unremarkable. Kidneys are normal, without renal calculi, focal lesion, or hydronephrosis. Bladder is unremarkable. Stomach/Bowel: Stomach is within normal limits. Appendix appears normal. No evidence of bowel wall thickening, distention, or inflammatory changes. Vascular/Lymphatic: Aortic atherosclerosis. No enlarged abdominal or pelvic lymph nodes. Reproductive: Status post hysterectomy. No adnexal masses. Other: No abdominal wall hernia or abnormality. No abdominopelvic ascites. Diffuse subcutaneous edema. Musculoskeletal: No acute or significant osseous findings. IMPRESSION: 1. No acute abnormalities within the abdomen or pelvis. 2. Bilateral pleural effusions with severe interstitial lung changes. 3. Minimal pericardial effusion. 4. Diffuse subcutaneous edema. 5. Aortic atherosclerosis. Aortic Atherosclerosis (ICD10-I70.0). Electronically Signed   By: DFidela SalisburyM.D.   On: 01/14/2022 12:18   CT Angio Chest Pulmonary Embolism (PE) W or WO Contrast  Result Date: 01/14/2022 CLINICAL DATA:  Shortness of breath with productive cough EXAM: CT ANGIOGRAPHY CHEST  WITH CONTRAST TECHNIQUE: Multidetector CT imaging of the chest was performed using the standard protocol during bolus administration of intravenous contrast. Multiplanar CT image reconstructions and MIPs were obtained to evaluate the vascular anatomy. RADIATION DOSE REDUCTION: This exam was performed according to the departmental dose-optimization program which includes automated exposure control, adjustment of the mA and/or kV according to patient size and/or use of iterative reconstruction technique. CONTRAST:  44m OMNIPAQUE IOHEXOL  350 MG/ML SOLN COMPARISON:  Radiograph 01/13/2022 and CT chest angiogram 09/11/2021 FINDINGS: Cardiovascular: Satisfactory opacification of the pulmonary arteries to the segmental level. No evidence of pulmonary embolism. Cardiomegaly. No pericardial effusion. Advanced coronary artery and aortic atherosclerotic calcification. Locule of gas anterior to the right thyroid lobe likely due to intravenous injection. Reflux of contrast into the hepatic veins can be seen with elevated right heart pressures. Mediastinum/Nodes: No enlarged mediastinal, hilar, or axillary lymph nodes. Thyroid gland, trachea, and esophagus demonstrate no significant findings. Lungs/Pleura: Diffuse emphysematous change. Diffuse bronchial wall thickening. Peripheral predominant reticular opacities greatest in the left lower lung. Interlobular septal thickening is diffuse throughout both lungs but greatest in the left lower lobe. Patchy ground-glass and nodular opacities greatest in the left lower lobe. Small left-greater-than-right pleural effusions and associated atelectasis. These findings are new or increased since 09/11/2021. Upper Abdomen: Calcified plaque at the origin of the celiac artery and SMA cause advanced narrowing. No acute abnormality. Musculoskeletal: No chest wall abnormality. No acute osseous findings. Review of the MIP images confirms the above findings. IMPRESSION: Negative for acute pulmonary embolism. Advanced emphysema/interstitial lung disease with superimposed interstitial pneumonia and/or pulmonary edema. Small bilateral pleural effusions. Cardiomegaly and advanced coronary artery atherosclerotic calcification. Reflux of contrast into the hepatic veins can be seen with elevated right heart pressures. Aortic Atherosclerosis (ICD10-I70.0) and Emphysema (ICD10-J43.9). 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 Electronically Signed   By: TPlacido SouM.D.   On: 01/14/2022  00:34   DG Chest Portable 1 View  Result Date: 01/13/2022 CLINICAL DATA:  71year old female with shortness of breath. Smoker. Testing for COVID-19. Pending. EXAM: PORTABLE CHEST 1 VIEW COMPARISON:  Chest radiographs 01/04/2022 and earlier, including CTA 09/11/2021. FINDINGS: Portable AP upright view at 0341 hours. Severe chronic lung disease with widespread coarse pulmonary interstitial opacity. Lower lung volumes compared to last month. Also, increased confluence of opacity at the left lung base with suspicion now of small left versus bilateral pleural effusions. No pneumothorax. Stable cardiac size and mediastinal contours. No pulmonary edema suspected. No acute osseous abnormality identified. Paucity of bowel gas. IMPRESSION: Severe chronic lung disease with suspected new small pleural effusions and increased confluence of opacity at the left lung base such as due to acute infectious exacerbation. Electronically Signed   By: HGenevie AnnM.D.   On: 01/13/2022 03:52   DG Chest 2 View  Result Date: 01/07/2022 CLINICAL DATA:  Shortness of breath, smoker EXAM: CHEST - 2 VIEW COMPARISON:  12/07/2021 FINDINGS: Cardiomegaly. Emphysema with superimposed diffuse bilateral interstitial opacity and new heterogeneous airspace opacity of the lingula. Pulmonary hyperinflation. Disc degenerative disease throughout the thoracic spine. IMPRESSION: 1. New heterogeneous airspace opacity of the lingula, concerning for infection or aspiration. 2. Emphysema and diffuse bilateral interstitial pulmonary opacity. 3. Cardiomegaly. Electronically Signed   By: ADelanna AhmadiM.D.   On: 01/07/2022 10:32   (Echo, Carotid, EGD, Colonoscopy, ERCP)    Subjective: Pt c/o fatigue    Discharge Exam: Vitals:   01/21/22 0421 01/21/22 0800  BP:  (!) 172/71  Pulse:  61  Resp:  20  Temp: 98 F (36.7 C) 98 F (36.7 C)  SpO2:  100%   Vitals:   01/20/22 2054 01/21/22 0015 01/21/22 0421 01/21/22 0800  BP: (!) 140/61   (!) 172/71   Pulse:    61  Resp:    20  Temp:  98.2 F (36.8 C) 98 F (36.7 C) 98 F (36.7 C)  TempSrc:  Oral Oral Oral  SpO2:    100%  Weight:      Height:        General: Pt is alert, awake, not in acute distress Cardiovascular:  S1/S2 +, no rubs, no gallops Respiratory: decreased breath sounds b/l  Abdominal: Soft, NT, ND, bowel sounds + Extremities: no edema, no cyanosis    The results of significant diagnostics from this hospitalization (including imaging, microbiology, ancillary and laboratory) are listed below for reference.     Microbiology: Recent Results (from the past 240 hour(s))  Resp Panel by RT-PCR (Flu A&B, Covid) Anterior Nasal Swab     Status: None   Collection Time: 01/13/22  3:53 AM   Specimen: Anterior Nasal Swab  Result Value Ref Range Status   SARS Coronavirus 2 by RT PCR NEGATIVE NEGATIVE Final    Comment: (NOTE) SARS-CoV-2 target nucleic acids are NOT DETECTED.  The SARS-CoV-2 RNA is generally detectable in upper respiratory specimens during the acute phase of infection. The lowest concentration of SARS-CoV-2 viral copies this assay can detect is 138 copies/mL. A negative result does not preclude SARS-Cov-2 infection and should not be used as the sole basis for treatment or other patient management decisions. A negative result may occur with  improper specimen collection/handling, submission of specimen other than nasopharyngeal swab, presence of viral mutation(s) within the areas targeted by this assay, and inadequate number of viral copies(<138 copies/mL). A negative result must be combined with clinical observations, patient history, and epidemiological information. The expected result is Negative.  Fact Sheet for Patients:  EntrepreneurPulse.com.au  Fact Sheet for Healthcare Providers:  IncredibleEmployment.be  This test is no t yet approved or cleared by the [place] FDA and  has been authorized for  detection and/or diagnosis of SARS-CoV-2 by FDA under an Emergency Use Authorization (EUA). This EUA will remain  in effect (meaning this test can be used) for the duration of the COVID-19 declaration under Section 564(b)(1) of the Act, 21 U.S.C.section 360bbb-3(b)(1), unless the authorization is terminated  or revoked sooner.       Influenza A by PCR NEGATIVE NEGATIVE Final   Influenza B by PCR NEGATIVE NEGATIVE Final    Comment: (NOTE) The Xpert Xpress SARS-CoV-2/FLU/RSV plus assay is intended as an aid in the diagnosis of influenza from Nasopharyngeal swab specimens and should not be used as a sole basis for treatment. Nasal washings and aspirates are unacceptable for Xpert Xpress SARS-CoV-2/FLU/RSV testing.  Fact Sheet for Patients: EntrepreneurPulse.com.au  Fact Sheet for Healthcare Providers: IncredibleEmployment.be  This test is not yet approved or cleared by the [place] FDA and has been authorized for detection and/or diagnosis of SARS-CoV-2 by FDA under an Emergency Use Authorization (EUA). This EUA will remain in effect (meaning this test can be used) for the duration of the COVID-19 declaration under Section 564(b)(1) of the Act, 21 U.S.C. section 360bbb-3(b)(1), unless the authorization is terminated or revoked.  Performed at [facility], [address], [place], [place] [address]   Culture, blood (routine x 2)  Status: None   Collection Time: 01/13/22  6:30 AM   Specimen: BLOOD  Result Value Ref Range Status   Specimen Description BLOOD LEFT FOREARM  Final   Special Requests   Final    BOTTLES DRAWN AEROBIC AND ANAEROBIC Blood Culture results may not be optimal due to an inadequate volume of blood received in culture bottles   Culture   Final    NO GROWTH 5 DAYS Performed at [facility], [address] [place], [place] [address]    Report Status 01/18/2022 FINAL  Final  Culture, blood  (routine x 2)     Status: None   Collection Time: 01/13/22  6:30 AM   Specimen: BLOOD  Result Value Ref Range Status   Specimen Description BLOOD RIGHT FOREARM  Final   Special Requests   Final    BOTTLES DRAWN AEROBIC AND ANAEROBIC Blood Culture adequate volume   Culture   Final    NO GROWTH 5 DAYS Performed at [facility], [address] [place], [place] [address]    Report Status 01/18/2022 FINAL  Final  Respiratory (~20 pathogens) panel by PCR     Status: None   Collection Time: 01/14/22  2:05 AM   Specimen: Nasopharyngeal Swab; Respiratory  Result Value Ref Range Status   Adenovirus NOT DETECTED NOT DETECTED Final   Coronavirus 229E NOT DETECTED NOT DETECTED Final    Comment: (NOTE) The Coronavirus on the Respiratory Panel, DOES NOT test for the novel  Coronavirus (2019 nCoV)    Coronavirus HKU1 NOT DETECTED NOT DETECTED Final   Coronavirus NL63 NOT DETECTED NOT DETECTED Final   Coronavirus OC43 NOT DETECTED NOT DETECTED Final   Metapneumovirus NOT DETECTED NOT DETECTED Final   Rhinovirus / Enterovirus NOT DETECTED NOT DETECTED Final   Influenza A NOT DETECTED NOT DETECTED Final   Influenza B NOT DETECTED NOT DETECTED Final   Parainfluenza Virus 1 NOT DETECTED NOT DETECTED Final   Parainfluenza Virus 2 NOT DETECTED NOT DETECTED Final   Parainfluenza Virus 3 NOT DETECTED NOT DETECTED Final   Parainfluenza Virus 4 NOT DETECTED NOT DETECTED Final   Respiratory Syncytial Virus NOT DETECTED NOT DETECTED Final   Bordetella pertussis NOT DETECTED NOT DETECTED Final   Bordetella Parapertussis NOT DETECTED NOT DETECTED Final   Chlamydophila pneumoniae NOT DETECTED NOT DETECTED Final   Mycoplasma pneumoniae NOT DETECTED NOT DETECTED Final    Comment: Performed at [facility], [place] [address] [place], [place] [address]  Expectorated Sputum Assessment w Gram Stain, Rflx to Resp Cult     Status: None   Collection Time: 01/16/22  5:15 PM   Specimen: Sputum  Result  Value Ref Range Status   Specimen Description SPUTUM  Final   Special Requests EXPSU  Final   Sputum evaluation   Final    THIS SPECIMEN IS ACCEPTABLE FOR SPUTUM CULTURE Performed at [facility], [address], [place], [place] [address]    Report Status 01/16/2022 FINAL  Final  Culture, Respiratory w Gram Stain     Status: None   Collection Time: 01/16/22  5:15 PM   Specimen: SPU  Result Value Ref Range Status   Specimen Description   Final    SPUTUM Performed at [facility], [address] [place], [place] [address]    Special Requests   Final    EXPSU Reflexed from [id number] Performed at [facility], [address], [place], [place] [address]    Gram Stain  Final    FEW SQUAMOUS EPITHELIAL CELLS PRESENT FEW WBC PRESENT, PREDOMINANTLY MONONUCLEAR MODERATE YEAST WITH PSEUDOHYPHAE RARE GRAM POSITIVE COCCI IN PAIRS Performed at [facility], [place] [address], [place], [place] [address]    Culture FEW STENOTROPHOMONAS MALTOPHILIA  Final   Report Status 01/20/2022 FINAL  Final   Organism ID, Bacteria STENOTROPHOMONAS MALTOPHILIA  Final      Susceptibility   Stenotrophomonas maltophilia - MIC*    LEVOFLOXACIN 1 SENSITIVE Sensitive     TRIMETH/SULFA <=20 SENSITIVE Sensitive     * FEW STENOTROPHOMONAS MALTOPHILIA     Labs: BNP (last 3 results) Recent Labs    03/09/21 1138  BNP 250.0*   Basic Metabolic Panel: Recent Labs  Lab 01/17/22 0416 01/18/22 0408 01/19/22 0529 01/20/22 0532 01/21/22 0638  NA 129* 132* 133* 130* 130*  K 4.5 4.5 4.4 3.8 3.6  CL 94* 94* 90* 89* 87*  CO2 30 31 33* 34* 35*  GLUCOSE 102* 119* 109* 90 102*  BUN _0 26*  CREATININE 0.50 0.55 0.65 0.65 0.72  CALCIUM 8.8* 9.0 9.6 8.9 8.8*   Liver Function Tests: No results for input(s): "AST", "ALT", "ALKPHOS", "BILITOT", "PROT", "ALBUMIN" in the last 168 hours. No results for input(s): "LIPASE", "AMYLASE" in the last 168 hours. No results for  input(s): "AMMONIA" in the last 168 hours. CBC: Recent Labs  Lab 01/17/22 0416 01/18/22 0408 01/18/22 2007 01/19/22 0529 01/20/22 0532 01/21/22 0638  WBC 7.1 6.4  --  9.1 7.7 9.8  HGB 7.1* 7.1* 9.8* 10.8* 9.4* 10.2*  HCT 21.7* 21.9* 28.9* 31.8* 27.6* 30.7*  MCV 88.6 88.7  --  85.9 85.7 86.5  PLT 343 368  --  453* 356 400   Cardiac Enzymes: No results for input(s): "CKTOTAL", "CKMB", "CKMBINDEX", "TROPONINI" in the last 168 hours. BNP: Invalid input(s): "POCBNP" CBG: No results for input(s): "GLUCAP" in the last 168 hours. D-Dimer No results for input(s): "DDIMER" in the last 72 hours. Hgb A1c No results for input(s): "HGBA1C" in the last 72 hours. Lipid Profile No results for input(s): "CHOL", "HDL", "LDLCALC", "TRIG", "CHOLHDL", "LDLDIRECT" in the last 72 hours. Thyroid function studies No results for input(s): "TSH", "T4TOTAL", "T3FREE", "THYROIDAB" in the last 72 hours.  Invalid input(s): "FREET3" Anemia work up No results for input(s): "VITAMINB12", "FOLATE", "FERRITIN", "TIBC", "IRON", "RETICCTPCT" in the last 72 hours. Urinalysis    Component Value Date/Time   COLORURINE YELLOW (A) 01/13/2022 0352   APPEARANCEUR CLEAR (A) 01/13/2022 0352   APPEARANCEUR Clear 12/27/2021 1316   LABSPEC 1.011 01/13/2022 0352   PHURINE 6.0 01/13/2022 0352   GLUCOSEU NEGATIVE 01/13/2022 0352   HGBUR NEGATIVE 01/13/2022 0352   BILIRUBINUR NEGATIVE 01/13/2022 0352   BILIRUBINUR Negative 12/27/2021 1316   KETONESUR 5 (A) 01/13/2022 0352   PROTEINUR 30 (A) 01/13/2022 0352   NITRITE NEGATIVE 01/13/2022 0352   LEUKOCYTESUR NEGATIVE 01/13/2022 0352   Sepsis Labs Recent Labs  Lab 01/18/22 0408 01/19/22 0529 01/20/22 0532 01/21/22 0638  WBC 6.4 9.1 7.7 9.8   Microbiology Recent Results (from the past 240 hour(s))  Resp Panel by RT-PCR (Flu A&B, Covid) Anterior Nasal Swab     Status: None   Collection Time: 01/13/22  3:53 AM   Specimen: Anterior Nasal Swab  Result Value Ref  Range Status   SARS Coronavirus 2 by RT PCR NEGATIVE NEGATIVE Final    Comment: (NOTE) SARS-CoV-2 target nucleic acids are NOT DETECTED.  The SARS-CoV-2 RNA is generally detectable in upper respiratory specimens  during the acute phase of infection. The lowest concentration of SARS-CoV-2 viral copies this assay can detect is 138 copies/mL. A negative result does not preclude SARS-Cov-2 infection and should not be used as the sole basis for treatment or other patient management decisions. A negative result may occur with  improper specimen collection/handling, submission of specimen other than nasopharyngeal swab, presence of viral mutation(s) within the areas targeted by this assay, and inadequate number of viral copies(<138 copies/mL). A negative result must be combined with clinical observations, patient history, and epidemiological information. The expected result is Negative.  Fact Sheet for Patients:  EntrepreneurPulse.com.au  Fact Sheet for Healthcare Providers:  IncredibleEmployment.be  This test is no t yet approved or cleared by the [place] FDA and  has been authorized for detection and/or diagnosis of SARS-CoV-2 by FDA under an Emergency Use Authorization (EUA). This EUA will remain  in effect (meaning this test can be used) for the duration of the COVID-19 declaration under Section 564(b)(1) of the Act, 21 U.S.C.section 360bbb-3(b)(1), unless the authorization is terminated  or revoked sooner.       Influenza A by PCR NEGATIVE NEGATIVE Final   Influenza B by PCR NEGATIVE NEGATIVE Final    Comment: (NOTE) The Xpert Xpress SARS-CoV-2/FLU/RSV plus assay is intended as an aid in the diagnosis of influenza from Nasopharyngeal swab specimens and should not be used as a sole basis for treatment. Nasal washings and aspirates are unacceptable for Xpert Xpress SARS-CoV-2/FLU/RSV testing.  Fact Sheet for  Patients: EntrepreneurPulse.com.au  Fact Sheet for Healthcare Providers: IncredibleEmployment.be  This test is not yet approved or cleared by the [place] FDA and has been authorized for detection and/or diagnosis of SARS-CoV-2 by FDA under an Emergency Use Authorization (EUA). This EUA will remain in effect (meaning this test can be used) for the duration of the COVID-19 declaration under Section 564(b)(1) of the Act, 21 U.S.C. section 360bbb-3(b)(1), unless the authorization is terminated or revoked.  Performed at [facility], [address] [place], [place] [address]   Culture, blood (routine x 2)     Status: None   Collection Time: 01/13/22  6:30 AM   Specimen: BLOOD  Result Value Ref Range Status   Specimen Description BLOOD LEFT FOREARM  Final   Special Requests   Final    BOTTLES DRAWN AEROBIC AND ANAEROBIC Blood Culture results may not be optimal due to an inadequate volume of blood received in culture bottles   Culture   Final    NO GROWTH 5 DAYS Performed at [facility], [address], [place], [place] [address]    Report Status 01/18/2022 FINAL  Final  Culture, blood (routine x 2)     Status: None   Collection Time: 01/13/22  6:30 AM   Specimen: BLOOD  Result Value Ref Range Status   Specimen Description BLOOD RIGHT FOREARM  Final   Special Requests   Final    BOTTLES DRAWN AEROBIC AND ANAEROBIC Blood Culture adequate volume   Culture   Final    NO GROWTH 5 DAYS Performed at [facility], [address] [place], [place] [address]    Report Status 01/18/2022 FINAL  Final  Respiratory (~20 pathogens) panel by PCR     Status: None   Collection Time: 01/14/22  2:05 AM   Specimen: Nasopharyngeal Swab; Respiratory  Result Value Ref Range Status   Adenovirus NOT DETECTED NOT DETECTED Final   Coronavirus 229E NOT DETECTED NOT DETECTED Final  Comment: (NOTE) The Coronavirus on the  Respiratory Panel, DOES NOT test for the novel  Coronavirus (2019 nCoV)    Coronavirus HKU1 NOT DETECTED NOT DETECTED Final   Coronavirus NL63 NOT DETECTED NOT DETECTED Final   Coronavirus OC43 NOT DETECTED NOT DETECTED Final   Metapneumovirus NOT DETECTED NOT DETECTED Final   Rhinovirus / Enterovirus NOT DETECTED NOT DETECTED Final   Influenza A NOT DETECTED NOT DETECTED Final   Influenza B NOT DETECTED NOT DETECTED Final   Parainfluenza Virus 1 NOT DETECTED NOT DETECTED Final   Parainfluenza Virus 2 NOT DETECTED NOT DETECTED Final   Parainfluenza Virus 3 NOT DETECTED NOT DETECTED Final   Parainfluenza Virus 4 NOT DETECTED NOT DETECTED Final   Respiratory Syncytial Virus NOT DETECTED NOT DETECTED Final   Bordetella pertussis NOT DETECTED NOT DETECTED Final   Bordetella Parapertussis NOT DETECTED NOT DETECTED Final   Chlamydophila pneumoniae NOT DETECTED NOT DETECTED Final   Mycoplasma pneumoniae NOT DETECTED NOT DETECTED Final    Comment: Performed at [facility], [place] [address], [place], [place] [address]  Expectorated Sputum Assessment w Gram Stain, Rflx to Resp Cult     Status: None   Collection Time: 01/16/22  5:15 PM   Specimen: Sputum  Result Value Ref Range Status   Specimen Description SPUTUM  Final   Special Requests EXPSU  Final   Sputum evaluation   Final    THIS SPECIMEN IS ACCEPTABLE FOR SPUTUM CULTURE Performed at [facility], [address], [place], [place] [address]    Report Status 01/16/2022 FINAL  Final  Culture, Respiratory w Gram Stain     Status: None   Collection Time: 01/16/22  5:15 PM   Specimen: SPU  Result Value Ref Range Status   Specimen Description   Final    SPUTUM Performed at [facility], [address], [place], [place] [address]    Special Requests   Final    EXPSU Reflexed from [phone] Performed at [facility], [address] [place], [place] [address]    Gram Stain   Final    FEW SQUAMOUS  EPITHELIAL CELLS PRESENT FEW WBC PRESENT, PREDOMINANTLY MONONUCLEAR MODERATE YEAST WITH PSEUDOHYPHAE RARE GRAM POSITIVE COCCI IN PAIRS Performed at [facility], [place] [address] [place], [place] [address]    Culture FEW STENOTROPHOMONAS MALTOPHILIA  Final   Report Status 01/20/2022 FINAL  Final   Organism ID, Bacteria STENOTROPHOMONAS MALTOPHILIA  Final      Susceptibility   Stenotrophomonas maltophilia - MIC*    LEVOFLOXACIN 1 SENSITIVE Sensitive     TRIMETH/SULFA <=20 SENSITIVE Sensitive     * FEW STENOTROPHOMONAS MALTOPHILIA     Time coordinating discharge: Over 30 minutes  SIGNED:   Wyvonnia Dusky, MD  [facility] 01/21/2022, 12:15 PM Pager   If 7PM-7AM, please contact night-coverage [email or web address]

## 2022-01-21 NOTE — Progress Notes (Signed)
SUBJECTIVE: Kristi Clark is a 71 y.o. female brought to the ED via EMS from home on 01/13/22 with a chief complaint of shortness of breath.  Patient with a history of COPD on 2 L continuous oxygen who has had recurrent community-acquired pneumonia over the past several weeks requiring several rounds of antibiotics and prednisone.  Seen by her PCP the day prior and prescribed another course of doxycycline.  Also endorses unintentional weight loss over the past several months.  Reports shortness of breath with productive cough of yellow sputum.  Denies fever/chills, chest pain, abdominal pain, nausea, vomiting or dizziness.     Patient was found to have new onset atrial fibrillation. Was started on amiodarone. Eliquis was held due to anemia.    Vitals:   01/20/22 2054 01/21/22 0015 01/21/22 0421 01/21/22 0800  BP: (!) 140/61   (!) 172/71  Pulse:    61  Resp:    20  Temp:  98.2 F (36.8 C) 98 F (36.7 C) 98 F (36.7 C)  TempSrc:  Oral Oral Oral  SpO2:    100%  Weight:      Height:        Intake/Output Summary (Last 24 hours) at 01/21/2022 0848 Last data filed at 01/21/2022 0235 Gross per 24 hour  Intake 120 ml  Output 650 ml  Net -530 ml    LABS: Basic Metabolic Panel: Recent Labs    01/20/22 0532 01/21/22 0638  NA 130* 130*  K 3.8 3.6  CL 89* 87*  CO2 34* 35*  GLUCOSE 90 102*  BUN 14 26*  CREATININE 0.65 0.72  CALCIUM 8.9 8.8*   Liver Function Tests: No results for input(s): "AST", "ALT", "ALKPHOS", "BILITOT", "PROT", "ALBUMIN" in the last 72 hours. No results for input(s): "LIPASE", "AMYLASE" in the last 72 hours. CBC: Recent Labs    01/20/22 0532 01/21/22 0638  WBC 7.7 9.8  HGB 9.4* 10.2*  HCT 27.6* 30.7*  MCV 85.7 86.5  PLT 356 400   Cardiac Enzymes: No results for input(s): "CKTOTAL", "CKMB", "CKMBINDEX", "TROPONINI" in the last 72 hours. BNP: Invalid input(s): "POCBNP" D-Dimer: No results for input(s): "DDIMER" in the last 72 hours. Hemoglobin  A1C: No results for input(s): "HGBA1C" in the last 72 hours. Fasting Lipid Panel: No results for input(s): "CHOL", "HDL", "LDLCALC", "TRIG", "CHOLHDL", "LDLDIRECT" in the last 72 hours. Thyroid Function Tests: No results for input(s): "TSH", "T4TOTAL", "T3FREE", "THYROIDAB" in the last 72 hours.  Invalid input(s): "FREET3" Anemia Panel: No results for input(s): "VITAMINB12", "FOLATE", "FERRITIN", "TIBC", "IRON", "RETICCTPCT" in the last 72 hours.   PHYSICAL EXAM General: Well developed, well nourished, in no acute distress HEENT:  Normocephalic and atramatic Neck:  No JVD.  Lungs: Clear bilaterally to auscultation and percussion. Heart: HRRR . Normal S1 and S2 without gallops or murmurs.  Abdomen: Bowel sounds are positive, abdomen soft and non-tender  Msk:  Back normal, normal gait. Normal strength and tone for age. Extremities: No clubbing, cyanosis or edema.   Neuro: Alert and oriented X 3. Psych:  Good affect, responds appropriately  TELEMETRY: sinus rhythm, HR 61 bpm  ASSESSMENT AND PLAN: Patient resting comfortably in bed, sitting up. Reports she feels better.  Echocardiogram results revealed normal EF 55-60%, grade II DD, mild MR, mild-mod AR. Patient currently in sinus rhythm. Continue po amiodarone. Consider Eliquis when anemia resolved. Palliative care consulted. Daughter reports hospital bed delivered to home last evening. Patient can follow up as an outpatient on Monday 01/24/22 at 11:00 am  Principal Problem:   COPD exacerbation ([place]) Active Problems:   GERD (gastroesophageal reflux disease)   Hyperlipidemia   Benign hypertension with chronic kidney disease   Goals of care, counseling/discussion   CAD (coronary artery disease)   Hyponatremia   Aortic atherosclerosis (HCC)   Unintentional weight loss   Malnutrition of moderate degree   Acute on chronic respiratory failure with hypoxia (HCC)   Normocytic anemia   Transaminitis   Atrial fibrillation with RVR  ([place])    [name], FNP-C 01/21/2022 8:48 AM

## 2022-01-21 NOTE — TOC Transition Note (Signed)
Transition of Care [facility]) - CM/SW Discharge Note   Patient Details  Name: Kristi Clark MRN: [id number] Date of Birth: 14-Jul-1950  [facility] CM/SW Contact:  Tiburcio Bash, LCSW Phone Number: 01/21/2022, 12:21 PM   Clinical Narrative:      Patient to discharge home with [facility] for PT and OT, Cheryl with [facility] informed of dc today. Patient has received hospital bed delivered to home, 3in1 to be delivered at bedside by [employer]. O2 has been delivered to home, patient's daughter to transport.   No further dc needs at this time.  Final next level of care: [place] Barriers to Discharge: No Barriers Identified   Patient Goals and CMS Choice Patient states their goals for this hospitalization and ongoing recovery are:: to go home CMS Medicare.gov Compare Post Acute Care list provided to:: Patient Choice offered to / list presented to : Patient  Discharge Placement                       Discharge Plan and Services                DME Arranged: Hospital bed, 3-N-1, Oxygen DME Agency: [facility] Date DME Agency Contacted: 01/21/22 Time DME Agency Contacted: 1221 Representative spoke with at DME Agency: Suanne Marker HH Arranged: PT, OT [facility] Agency: [place] Date [place]: 01/21/22 Time [place]: 1221 Representative spoke with at [place]: [place] Determinants of Health ([place]) Interventions     Readmission Risk Interventions     No data to display

## 2022-01-21 NOTE — TOC Progression Note (Addendum)
Transition of Care [facility]) - Progression Note    Patient Details  Name: Kristi Clark MRN: [id number] Date of Birth: 1950-12-11  Transition of Care [facility]) CM/SW [place], [place] Phone Number: 01/21/2022, 9:45 AM  Clinical Narrative:     CSW spoke with patient's daughter Alyse Low regarding hospital bed that was to be delivered on Monday that was previously delayed. Alyse Low reports she did receive bed.   3in1 ordered to be delivered at bedside via [employer].   TOC will continue to follow for needs.   Expected Discharge Plan:  (TBD) Barriers to Discharge: Continued Medical Work up  Expected Discharge Plan and Services Expected Discharge Plan:  (TBD)       Living arrangements for the past 2 months: Single Family Home                                       Social Determinants of Health (SDOH) Interventions    Readmission Risk Interventions     No data to display

## 2022-01-21 NOTE — Consult Note (Addendum)
Pharmacy Antibiotic Note  Kristi Clark is a 71 y.o. female admitted on 01/13/2022 with COPDE with history of Pseudomonas (respiratory culture, 10/2018). PMH significant for keratosis, basal cell carcinoma, COPD, depression, GERD, HLD, HTN, CAD. CTA negative for PE, but revealed possible interstitial pneumonia and/or pulmonary edema. Procalcitonin negative. Pharmacy has been consulted for Levofloxacin dosing.  Plan: Day 4 of culture-appropriate antibiotics Continue Levofloxacin 750 mg Q48H Continue to monitor renal function and follow culture results   Height: '4\' 5"'$  (134.6 cm) Weight: 44.4 kg (97 lb 14.4 oz) IBW/kg (Calculated) : 29.4  Temp (24hrs), Avg:98.3 F (36.8 C), Min:98 F (36.7 C), Max:98.8 F (37.1 C)  Recent Labs  Lab 01/17/22 0416 01/18/22 0408 01/19/22 0529 01/20/22 0532 01/21/22 0638  WBC 7.1 6.4 9.1 7.7 9.8  CREATININE 0.50 0.55 0.65 0.65 0.72     Estimated Creatinine Clearance: 35 mL/min (by C-G formula based on SCr of 0.72 mg/dL).    Allergies  Allergen Reactions   Clindamycin/Lincomycin Hives   Prednisone Other (See Comments)    Psychosis   Amoxicillin Rash   Avelox [Moxifloxacin Hcl In Nacl] Rash   Codeine Sulfate Nausea Only   Penicillins Rash   Sulfa Antibiotics Rash    Antimicrobials this admission: 11/9 Doxycycline >> 11/10 11/9 Azithromycin >> 11/10 11/10 Cefepime >> 11/13 11/14 Levofloxacin >>  Dose adjustments this admission: N/A  Microbiology results: 11/9 BCx: NG final 11/10 RVPP: negative  11/12 Sputum: moderate yeast with pseudohyphae, few Stenotrophamonas maltophilia (S- TMP/SMX, levofloxacin)  Thank you for allowing pharmacy to be a part of this patient's care.  Gretel Acre, PharmD PGY1 Pharmacy Resident 01/21/2022 8:00 AM

## 2022-01-21 NOTE — Progress Notes (Cosign Needed)
    Durable Medical Equipment  (From admission, onward)           Start     Ordered   01/20/22 1103  For home use only DME Bedside commode  Once       Question:  Patient needs a bedside commode to treat with the following condition  Answer:  Generalized weakness   01/20/22 1102   01/17/22 1103  For home use only DME Hospital bed  Once       Question Answer Comment  Length of Need Lifetime   Bed type Semi-electric      01/17/22 1102   01/16/22 1400  For home use only DME oxygen  Once       Comments: humidified  Question Answer Comment  Length of Need Lifetime   Mode or (Route) Nasal cannula   Liters per Minute 3   Oxygen delivery system Other see comments      01/16/22 1400           Patient is not able to walk the distance required to go the bathroom, or he/she is unable to safely negotiate stairs required to access the bathroom.  A 3in1 BSC will alleviate this problem  Kelby Fam, [place], MSW, [place]

## 2022-01-22 ENCOUNTER — Telehealth: Payer: Self-pay | Admitting: Nurse Practitioner

## 2022-01-22 ENCOUNTER — Other Ambulatory Visit: Payer: Self-pay | Admitting: Nurse Practitioner

## 2022-01-22 MED ORDER — LEVOFLOXACIN 750 MG PO TABS: 750.0000 mg | ORAL_TABLET | ORAL | 0 refills | Status: AC

## 2022-01-22 NOTE — Telephone Encounter (Signed)
Spoke to nursing call team and was notified that patient was discharged home from hospital.  Was able to pick up all refills and medications sent from hospital at [facility] with exception of Levaquin which was to be sent in for PNA.  Reviewed discharge notes and this is present on the med list for discharge 750 MG every other day x 3 doses == allergy to Moxifloxacin noted with rash, but on review has taken Levaquin multiple times without issue in past.  Will send this in per discharge instructions, every other day, to assist in avoiding interactions.

## 2022-01-24 ENCOUNTER — Telehealth: Payer: Self-pay | Admitting: Family Medicine

## 2022-01-24 ENCOUNTER — Encounter: Payer: Self-pay | Admitting: Family Medicine

## 2022-01-24 DIAGNOSIS — F17200 Nicotine dependence, unspecified, uncomplicated: Secondary | ICD-10-CM | POA: Diagnosis not present

## 2022-01-24 DIAGNOSIS — J449 Chronic obstructive pulmonary disease, unspecified: Secondary | ICD-10-CM | POA: Diagnosis not present

## 2022-01-24 DIAGNOSIS — I10 Essential (primary) hypertension: Secondary | ICD-10-CM | POA: Diagnosis not present

## 2022-01-24 DIAGNOSIS — I340 Nonrheumatic mitral (valve) insufficiency: Secondary | ICD-10-CM | POA: Diagnosis not present

## 2022-01-24 DIAGNOSIS — I2510 Atherosclerotic heart disease of native coronary artery without angina pectoris: Secondary | ICD-10-CM | POA: Diagnosis not present

## 2022-01-24 DIAGNOSIS — R531 Weakness: Secondary | ICD-10-CM

## 2022-01-24 DIAGNOSIS — E782 Mixed hyperlipidemia: Secondary | ICD-10-CM | POA: Diagnosis not present

## 2022-01-24 DIAGNOSIS — Z9861 Coronary angioplasty status: Secondary | ICD-10-CM | POA: Diagnosis not present

## 2022-01-24 DIAGNOSIS — R0602 Shortness of breath: Secondary | ICD-10-CM | POA: Diagnosis not present

## 2022-01-24 DIAGNOSIS — R079 Chest pain, unspecified: Secondary | ICD-10-CM | POA: Diagnosis not present

## 2022-01-24 NOTE — Telephone Encounter (Signed)
Copied from [place] [id number]. Topic: General - Inquiry >> Jan 24, 2022  9:28 AM Penni Bombard wrote: Reason for CRM:  Pt's daughter Marita [place] called asking if Dr. Wynetta Emery could order her mom a walker and wheelchair.  She is using her late husbands but they are too big. CB@  [phone]

## 2022-01-30 DIAGNOSIS — I509 Heart failure, unspecified: Secondary | ICD-10-CM | POA: Diagnosis not present

## 2022-01-30 DIAGNOSIS — D631 Anemia in chronic kidney disease: Secondary | ICD-10-CM | POA: Diagnosis not present

## 2022-01-30 DIAGNOSIS — J9621 Acute and chronic respiratory failure with hypoxia: Secondary | ICD-10-CM | POA: Diagnosis not present

## 2022-01-30 DIAGNOSIS — N182 Chronic kidney disease, stage 2 (mild): Secondary | ICD-10-CM | POA: Diagnosis not present

## 2022-01-30 DIAGNOSIS — J441 Chronic obstructive pulmonary disease with (acute) exacerbation: Secondary | ICD-10-CM | POA: Diagnosis not present

## 2022-01-30 DIAGNOSIS — I130 Hypertensive heart and chronic kidney disease with heart failure and stage 1 through stage 4 chronic kidney disease, or unspecified chronic kidney disease: Secondary | ICD-10-CM | POA: Diagnosis not present

## 2022-01-30 NOTE — Patient Instructions (Signed)

## 2022-02-01 ENCOUNTER — Ambulatory Visit: Payer: Medicare Other | Admitting: Family Medicine

## 2022-02-01 DIAGNOSIS — R079 Chest pain, unspecified: Secondary | ICD-10-CM | POA: Diagnosis not present

## 2022-02-02 ENCOUNTER — Encounter: Payer: Self-pay | Admitting: *Deleted

## 2022-02-02 ENCOUNTER — Ambulatory Visit (INDEPENDENT_AMBULATORY_CARE_PROVIDER_SITE_OTHER): Payer: Medicare Other | Admitting: Nurse Practitioner

## 2022-02-02 ENCOUNTER — Ambulatory Visit: Payer: Self-pay | Admitting: *Deleted

## 2022-02-02 ENCOUNTER — Encounter: Payer: Self-pay | Admitting: Nurse Practitioner

## 2022-02-02 VITALS — BP 124/53 | HR 65 | Temp 97.5°F | Ht <= 58 in | Wt 90.3 lb

## 2022-02-02 DIAGNOSIS — J189 Pneumonia, unspecified organism: Secondary | ICD-10-CM

## 2022-02-02 DIAGNOSIS — E871 Hypo-osmolality and hyponatremia: Secondary | ICD-10-CM

## 2022-02-02 DIAGNOSIS — J449 Chronic obstructive pulmonary disease, unspecified: Secondary | ICD-10-CM

## 2022-02-02 DIAGNOSIS — I5082 Biventricular heart failure: Secondary | ICD-10-CM

## 2022-02-02 DIAGNOSIS — I4891 Unspecified atrial fibrillation: Secondary | ICD-10-CM

## 2022-02-02 DIAGNOSIS — E440 Moderate protein-calorie malnutrition: Secondary | ICD-10-CM | POA: Diagnosis not present

## 2022-02-02 DIAGNOSIS — D508 Other iron deficiency anemias: Secondary | ICD-10-CM

## 2022-02-02 DIAGNOSIS — J9611 Chronic respiratory failure with hypoxia: Secondary | ICD-10-CM

## 2022-02-02 DIAGNOSIS — F17219 Nicotine dependence, cigarettes, with unspecified nicotine-induced disorders: Secondary | ICD-10-CM | POA: Diagnosis not present

## 2022-02-02 DIAGNOSIS — R634 Abnormal weight loss: Secondary | ICD-10-CM | POA: Diagnosis not present

## 2022-02-02 DIAGNOSIS — F418 Other specified anxiety disorders: Secondary | ICD-10-CM | POA: Diagnosis not present

## 2022-02-02 DIAGNOSIS — E559 Vitamin D deficiency, unspecified: Secondary | ICD-10-CM

## 2022-02-02 DIAGNOSIS — I5032 Chronic diastolic (congestive) heart failure: Secondary | ICD-10-CM | POA: Diagnosis not present

## 2022-02-02 DIAGNOSIS — E876 Hypokalemia: Secondary | ICD-10-CM | POA: Diagnosis not present

## 2022-02-02 DIAGNOSIS — I2510 Atherosclerotic heart disease of native coronary artery without angina pectoris: Secondary | ICD-10-CM

## 2022-02-02 DIAGNOSIS — F339 Major depressive disorder, recurrent, unspecified: Secondary | ICD-10-CM | POA: Insufficient documentation

## 2022-02-02 MED ORDER — NICOTINE 14 MG/24HR TD PT24: 14.0000 mg | MEDICATED_PATCH | Freq: Every day | TRANSDERMAL | 3 refills | Status: DC

## 2022-02-02 MED ORDER — BUSPIRONE HCL 5 MG PO TABS: 5.0000 mg | ORAL_TABLET | Freq: Two times a day (BID) | ORAL | 4 refills | Status: DC

## 2022-02-02 NOTE — Assessment & Plan Note (Signed)
Remains on long term O2 at 3L presently by [place].  Continue this and collaboration with pulmonary.

## 2022-02-02 NOTE — Assessment & Plan Note (Signed)
Chronic, ongoing with recent exacerbation.  Symptoms improved at this time.  Continue current medication regimen and collaboration with pulmonary.  Is on long term oxygen, wearing this today.

## 2022-02-02 NOTE — Assessment & Plan Note (Signed)
Ongoing recent labs, was to see GI outpatient, referral placed today.  Repeat labs obtained: CBC, ferritin, iron, folate.

## 2022-02-02 NOTE — Assessment & Plan Note (Signed)
Currently no smoking since hospitalization -- sent in refills on patches and recommend continued cessation.

## 2022-02-02 NOTE — Progress Notes (Signed)
BP (!) 124/53   Pulse 65   Temp (!) 97.5 F (36.4 C) (Oral)   Ht 4' 4.99" (1.346 m)   Wt 90 lb 4.8 oz (41 kg)   LMP  (LMP Unknown)   SpO2 96%   BMI 22.61 kg/m    Subjective:    Patient ID: Kristi Clark, female    DOB: [birth year], 71 y.o.   MRN: [id number]  HPI: Kristi Clark is a 71 y.o. female  Chief Complaint  Patient presents with   Hospitalization Follow-up    Was d/c on 01/21/22   Daughter present at bedside.  Transition of Care Hospital Follow up.  Follow-up today for recent hospitalization for COPD exacerbation + PNA, was having SOB and coughing.  Her cardiologist noted A-Fib.  Currently feeling "real tired".  Reports "little bit" shortness of breath, but "not much".  Was seen by Dr. Humphrey Rolls on 01/24/22 -- then went for stress test yesterday, he reports may make medication changes based on results of this.  Currently taking ASA and Plavix.  Does endorse cold and hot changes often.  Denies palpitations or CP.  She quit smoking in the hospital -- is using patches at present.  Was smoking 2 PPD.    "Kristi Clark is a 71 y.o. female with medical history significant of keratosis, basal cell carcinoma, COPD, depression, GERD, hyperlipidemia, hypertension, disease, moderate CAD treated medically who is coming to the emergency department with complaints of progressively worse acute on chronic dyspnea for the past 2 weeks associated with wheezing, occasional productive cough with yellowish sputum, pleuritic chest wall pain, fatigue and decreased appetite.  She has also continued to lose weight.  No fever, chills or night sweats. No sore throat, rhinorrhea, dyspnea, wheezing or hemoptysis.  No chest pain, palpitations, diaphoresis, PND, orthopnea, but occasionally gets pitting edema of the lower extremities.  No diarrhea, occasional constipation constipation, no melena or hematochezia.  No flank pain, dysuria, frequency or hematuria.  Has stress incontinence.  No polyuria,  polydipsia, polyphagia or blurred vision.   ED course: Initial vital signs were temperature 98.1 degrees, pulse, respiration 20, BP 127 mm of nasal cannula oxygen patient received doxycycline 100 mg IVPB, 201,000 mL of normal saline bolus.   Lab work: Her urinalysis showed ketonuria 5 and proteinuria 30 mg/dL.  Troponin x2 and lactic acid x2 negative.  Normal procalcitonin.  Negative coronavirus and influenza PCR.  CBC with a white count 8.3, hemoglobin 9.1 g/dL platelets 319.  Her most recent hemoglobin level was 9.1 g/dL on 09/21/2021.  CMP showed a sodium 124, potassium 3.7, chloride 90 and CO2 28 mmol/L.  Renal function was normal.  Calcium is normal after correction.  Total protein 6.0, albumin 2.6 g/dL.  AST 51 and ALT 52 units/L.  Normal alk phos and bilirubin level.  Lipase was 67.   Imaging: Severe chronic lung disease with suspected new small pleural effusions and increased confluence of opacity of the left lung base questionable for acute infectious exacerbation.     As per Dr. Jimmye Norman 11/15-11/17/23: By the time I saw the pt, pt was already back on home oxygen level at 3L [place], as pt usually uses 2-3L chronically at home. Pt was continue on po levaquin, steroids and bronchodilators at d/c to complete the course. Of note, pt will f/u w/ cardio, Dr. Humphrey Rolls, on 01/24/22 at 11:00AM for new onset a. fib. Pt is taking amiodarone, metoprolol as per cardio & anticoagulation will be discussed as an outpatient w/  cardio. Also, GI wanted to EGD and colonoscopy for acute on chronic anemia but pt and pt's family refused. Pt was placed on pantoprazole 33m bid x 8 weeks, then daily there after as per GI. For more information or for events that occurred prior to 01/19/22, please see previous progress/consult notes. "  [facility]ician: Dr. [facility]le[facility] BowensD/C Date: 01/21/22  Records Requested: 02/02/22 Records Received: 02/02/22 Records Reviewed: 02/02/22  Diagnoses on Discharge: COPD  exacerbation  Date of interactive Contact within 48 hours of discharge:  Contact was through:  none  Date of 7 day or 14 day face-to-face visit:    within 14 days  Diagnostic Tests Reviewed/Disposition:  Reviewed in chart  Consults: cardiology  Discharge Instructions: Follow-up with PCP and cardiology  Disease/illness Education: Reviewed at length with family and patient  Home Health/Community Services Discussions/Referrals: Currently PT schedule to come to home  Establishment or re-establishment of referral orders for community resources: None  Discussion with other health care providers: Reviewed recent notes  Assessment and Support of treatment regimen adherence: Reviewed at length with family and patient  Appointments Coordinated with: Reviewed at length with family and patient  Education for self-management, independent living, and ADLs:       02/02/2022    1:34 PM 01/12/2022   10:46 AM 01/04/2022    3:27 PM 12/27/2021    1:10 PM 12/21/2021    9:06 AM  Depression screen PHQ 2/9  Decreased Interest _0 0 1  Down, Depressed, Hopeless _1 0  PHQ - 2 Score _2 Altered sleeping _3 0  Tired, decreased energy _4 0  Change in appetite _5 Feeling bad or failure about yourself  _6 0  Trouble concentrating _7 0 0  Moving slowly or fidgety/restless 0 3 0 0 0  Suicidal thoughts 0 0 0 0 0  PHQ-9 Score _8 Difficult doing work/chores Very difficult  Extremely dIfficult Somewhat difficult Somewhat difficult       02/02/2022    1:34 PM 01/12/2022   10:47 AM 01/04/2022    3:27 PM 12/27/2021    1:10 PM  GAD 7 : Generalized Anxiety Score  Nervous, Anxious, on Edge _9 Control/stop worrying _10 Worry too much - different things _11 Trouble relaxing 2 3 0 2  Restless 1 3 0 0  Easily annoyed or irritable 0 3 3 0  Afraid - awful might happen _12 0  Total GAD 7 Score _13 Anxiety Difficulty Somewhat  difficult Extremely difficult Extremely difficult Somewhat difficult   Outpatient Encounter Medications as of 02/02/2022  Medication Sig   albuterol (PROVENTIL) (2.5 MG/3ML) 0.083% nebulizer solution Take 3 mLs (2.5 mg total) by nebulization every 6 (six) hours as needed for wheezing or shortness of breath.   albuterol (VENTOLIN HFA) 108 (90 Base) MCG/ACT inhaler Inhale 2 puffs into the lungs every 6 (six) hours as needed for wheezing or shortness of breath.   amiodarone (PACERONE) 200 MG tablet Take 1 tablet (200 mg total) by mouth daily.   amLODipine (NORVASC) 10 MG tablet Take 1 tablet (10 mg total) by mouth daily.   aspirin 81 MG tablet Take 81 mg by mouth daily.   atorvastatin (LIPITOR) 40 MG tablet Take 1 tablet (40  mg total) by mouth daily.   Blood Pressure Monitoring (BLOOD PRESSURE MONITOR/WRIST) KIT Take Blood pressure as needed, Dx: I12.9   busPIRone (BUSPAR) 5 MG tablet Take 1 tablet (5 mg total) by mouth 2 (two) times daily.   cetirizine (ZYRTEC) 10 MG tablet Take 10 mg by mouth daily as needed for allergies.   clopidogrel (PLAVIX) 75 MG tablet Take 1 tablet (75 mg total) by mouth daily.   fluticasone-salmeterol (ADVAIR DISKUS) 250-50 MCG/ACT AEPB Inhale 1 puff into the lungs in the morning and at bedtime.   hydrALAZINE (APRESOLINE) 25 MG tablet Take 0.5 tablets (12.5 mg total) by mouth 2 (two) times daily.   hydrochlorothiazide (MICROZIDE) 12.5 MG capsule Take 1 capsule (12.5 mg total) by mouth daily as needed. (Patient taking differently: Take 12.5 mg by mouth daily as needed (fluid retention).)   isosorbide mononitrate (IMDUR) 60 MG 24 hr tablet Take 1 tablet (60 mg total) by mouth 2 (two) times daily.   losartan (COZAAR) 25 MG tablet Take 1 tablet (25 mg total) by mouth daily.   metoprolol succinate (TOPROL-XL) 50 MG 24 hr tablet Take 1 tablet (50 mg total) by mouth daily. Take with or immediately following a meal.   mirtazapine (REMERON) 45 MG tablet Take 1 tablet (45 mg total)  by mouth at bedtime.   Multiple Vitamins-Minerals (CENTRUM SILVER ADULT 50+ PO) Take 1 tablet by mouth daily.   Multiple Vitamins-Minerals (PRESERVISION AREDS 2+MULTI VIT) CAPS Take 1 capsule by mouth at bedtime.   nicotine (NICODERM CQ - DOSED IN MG/24 HOURS) 14 mg/24hr patch Place 1 patch (14 mg total) onto the skin daily.   pantoprazole (PROTONIX) 40 MG tablet Take 1 tablet (40 mg total) by mouth 2 (two) times daily.   polyethylene glycol powder (GLYCOLAX/MIRALAX) 17 GM/SCOOP powder Take 17 g by mouth daily as needed for moderate constipation.   Probiotic Product (PROBIOTIC-10 PO) Take 1 tablet by mouth every other day. Every other day   saline (AYR) GEL Place 1 Application into both nostrils every 4 (four) hours as needed.   tiotropium (SPIRIVA) 18 MCG inhalation capsule Place 1 capsule (18 mcg total) into inhaler and inhale daily.   [DISCONTINUED] ondansetron (ZOFRAN) 4 MG tablet Take 1 tablet (4 mg total) by mouth every 8 (eight) hours as needed for nausea or vomiting.   Facility-Administered Encounter Medications as of 02/02/2022  Medication   sodium chloride flush (NS) 0.9 % injection 3 mL   triamcinolone acetonide (KENALOG-40) injection 40 mg   Relevant past medical, surgical, family and social history reviewed and updated as indicated. Interim medical history since our last visit reviewed. Allergies and medications reviewed and updated.  Review of Systems  Constitutional:  Positive for appetite change and fatigue. Negative for activity change, chills, diaphoresis and fever.  Respiratory:  Positive for cough (improved) and shortness of breath (improved). Negative for chest tightness and wheezing.   Cardiovascular:  Positive for leg swelling (a little bit). Negative for chest pain and palpitations.  Gastrointestinal:  Positive for abdominal pain (occasional) and constipation (a few days a week -- takes Miralax). Negative for abdominal distention, blood in stool, diarrhea, nausea and  vomiting.  Neurological: Negative.   Psychiatric/Behavioral: Negative.     Per HPI unless specifically indicated above     Objective:    BP (!) 124/53   Pulse 65   Temp (!) 97.5 F (36.4 C) (Oral)   Ht 4' 4.99" (1.346 m)   Wt 90 lb 4.8 oz (41 kg)  LMP  (LMP Unknown)   SpO2 96%   BMI 22.61 kg/m   Wt Readings from Last 3 Encounters:  02/02/22 90 lb 4.8 oz (41 kg)  01/19/22 97 lb 14.4 oz (44.4 kg)  01/12/22 91 lb 12.8 oz (41.6 kg)    Physical Exam Vitals and nursing note reviewed.  Constitutional:      General: She is awake. She is not in acute distress.    Appearance: She is well-developed, well-groomed and underweight. She is not ill-appearing or toxic-appearing.  HENT:     Head: Normocephalic.     Right Ear: Hearing, tympanic membrane, ear canal and external ear normal.     Left Ear: Hearing, tympanic membrane, ear canal and external ear normal.     Nose: Nose normal.     Mouth/Throat:     Mouth: Mucous membranes are moist.  Eyes:     General: Lids are normal.        Right eye: No discharge.        Left eye: No discharge.     Conjunctiva/sclera: Conjunctivae normal.     Pupils: Pupils are equal, round, and reactive to light.  Neck:     Thyroid: No thyromegaly.     Vascular: No carotid bruit.  Cardiovascular:     Rate and Rhythm: Normal rate and regular rhythm.     Heart sounds: Normal heart sounds. No murmur heard.    No gallop.  Pulmonary:     Effort: Pulmonary effort is normal. No accessory muscle usage or respiratory distress.     Breath sounds: No decreased air movement. Decreased breath sounds and wheezing present. No rhonchi.     Comments: Intermittent expiratory wheezes with diminished breath sounds throughout.  No rhonchi.  O2 via [place] at 3 L in place. Abdominal:     General: Bowel sounds are normal. There is no distension.     Palpations: Abdomen is soft.     Tenderness: There is no abdominal tenderness.  Musculoskeletal:     Cervical back: Normal  range of motion and neck supple.     Right lower leg: No edema.     Left lower leg: No edema.  Skin:    General: Skin is warm and dry.  Neurological:     Mental Status: She is alert and oriented to person, place, and time.  Psychiatric:        Attention and Perception: Attention normal.        Mood and Affect: Mood normal.        Speech: Speech normal.        Behavior: Behavior normal. Behavior is cooperative.        Thought Content: Thought content normal.     Results for orders placed or performed during the hospital encounter of 01/13/22  Resp Panel by RT-PCR (Flu A&B, Covid) Anterior Nasal Swab   Specimen: Anterior Nasal Swab  Result Value Ref Range   SARS Coronavirus 2 by RT PCR NEGATIVE NEGATIVE   Influenza A by PCR NEGATIVE NEGATIVE   Influenza B by PCR NEGATIVE NEGATIVE  Culture, blood (routine x 2)   Specimen: BLOOD  Result Value Ref Range   Specimen Description BLOOD LEFT FOREARM    Special Requests      BOTTLES DRAWN AEROBIC AND ANAEROBIC Blood Culture results may not be optimal due to an inadequate volume of blood received in culture bottles   Culture      NO GROWTH 5 DAYS Performed at [facility],  [place], [place] [address]    Report Status 01/18/2022 FINAL   Culture, blood (routine x 2)   Specimen: BLOOD  Result Value Ref Range   Specimen Description BLOOD RIGHT FOREARM    Special Requests      BOTTLES DRAWN AEROBIC AND ANAEROBIC Blood Culture adequate volume   Culture      NO GROWTH 5 DAYS Performed at [facility], [address] [place], [place] [address]    Report Status 01/18/2022 FINAL   Respiratory (~20 pathogens) panel by PCR   Specimen: Nasopharyngeal Swab; Respiratory  Result Value Ref Range   Adenovirus NOT DETECTED NOT DETECTED   Coronavirus 229E NOT DETECTED NOT DETECTED   Coronavirus HKU1 NOT DETECTED NOT DETECTED   Coronavirus NL63 NOT DETECTED NOT DETECTED   Coronavirus OC43 NOT DETECTED NOT  DETECTED   Metapneumovirus NOT DETECTED NOT DETECTED   Rhinovirus / Enterovirus NOT DETECTED NOT DETECTED   Influenza A NOT DETECTED NOT DETECTED   Influenza B NOT DETECTED NOT DETECTED   Parainfluenza Virus 1 NOT DETECTED NOT DETECTED   Parainfluenza Virus 2 NOT DETECTED NOT DETECTED   Parainfluenza Virus 3 NOT DETECTED NOT DETECTED   Parainfluenza Virus 4 NOT DETECTED NOT DETECTED   Respiratory Syncytial Virus NOT DETECTED NOT DETECTED   Bordetella pertussis NOT DETECTED NOT DETECTED   Bordetella Parapertussis NOT DETECTED NOT DETECTED   Chlamydophila pneumoniae NOT DETECTED NOT DETECTED   Mycoplasma pneumoniae NOT DETECTED NOT DETECTED  Expectorated Sputum Assessment w Gram Stain, Rflx to Resp Cult   Specimen: Sputum  Result Value Ref Range   Specimen Description SPUTUM    Special Requests EXPSU    Sputum evaluation      THIS SPECIMEN IS ACCEPTABLE FOR SPUTUM CULTURE Performed at [facility], [address] [place], [place] [address]    Report Status 01/16/2022 FINAL   Culture, Respiratory w Gram Stain   Specimen: SPU  Result Value Ref Range   Specimen Description      SPUTUM Performed at [facility], [address] [place], [place] [address]    Special Requests      EXPSU Reflexed from [phone] Performed at [facility], [address] [place], [place] [address]    Gram Stain      FEW SQUAMOUS EPITHELIAL CELLS PRESENT FEW WBC PRESENT, PREDOMINANTLY MONONUCLEAR MODERATE YEAST WITH PSEUDOHYPHAE RARE GRAM POSITIVE COCCI IN PAIRS Performed at [facility], [place] [address] [place], [place] [address]    Culture FEW STENOTROPHOMONAS MALTOPHILIA    Report Status 01/20/2022 FINAL    Organism ID, Bacteria STENOTROPHOMONAS MALTOPHILIA       Susceptibility   Stenotrophomonas maltophilia - MIC*    LEVOFLOXACIN 1 SENSITIVE Sensitive     TRIMETH/SULFA <=20 SENSITIVE Sensitive     * FEW STENOTROPHOMONAS MALTOPHILIA  CBC with Differential   Result Value Ref Range   WBC 8.3 4.0 - 10.5 K/uL   RBC 2.80 (L) 3.87 - 5.11 MIL/uL   Hemoglobin 8.1 (L) 12.0 - 15.0 g/dL   HCT 24.6 (L) 36.0 - 46.0 %   MCV 87.9 80.0 - 100.0 fL   MCH 28.9 26.0 - 34.0 pg   MCHC 32.9 30.0 - 36.0 g/dL   RDW 17.4 (H) 11.5 - 15.5 %   Platelets 319 150 - 400 K/uL   nRBC 0.0 0.0 - 0.2 %   Neutrophils Relative % 79 %   Neutro Abs 6.5 1.7 - 7.7 K/uL   Lymphocytes  Relative 13 %   Lymphs Abs 1.1 0.7 - 4.0 K/uL   Monocytes Relative 7 %   Monocytes Absolute 0.6 0.1 - 1.0 K/uL   Eosinophils Relative 0 %   Eosinophils Absolute 0.0 0.0 - 0.5 K/uL   Basophils Relative 0 %   Basophils Absolute 0.0 0.0 - 0.1 K/uL   Immature Granulocytes 1 %   Abs Immature Granulocytes 0.05 0.00 - 0.07 K/uL  Comprehensive metabolic panel  Result Value Ref Range   Sodium 124 (L) 135 - 145 mmol/L   Potassium 3.7 3.5 - 5.1 mmol/L   Chloride 90 (L) 98 - 111 mmol/L   CO2 28 22 - 32 mmol/L   Glucose, Bld 107 (H) 70 - 99 mg/dL   BUN 15 8 - 23 mg/dL   Creatinine, Ser 0.53 0.44 - 1.00 mg/dL   Calcium 8.6 (L) 8.9 - 10.3 mg/dL   Total Protein 6.0 (L) 6.5 - 8.1 g/dL   Albumin 2.6 (L) 3.5 - 5.0 g/dL   AST 51 (H) 15 - 41 U/L   ALT 52 (H) 0 - 44 U/L   Alkaline Phosphatase 96 38 - 126 U/L   Total Bilirubin 0.6 0.3 - 1.2 mg/dL   GFR, Estimated >60 >60 mL/min   Anion gap 6 5 - 15  Lipase, blood  Result Value Ref Range   Lipase 67 (H) 11 - 51 U/L  Urinalysis, Routine w reflex microscopic Urine, Clean Catch  Result Value Ref Range   Color, Urine YELLOW (A) YELLOW   APPearance CLEAR (A) CLEAR   Specific Gravity, Urine 1.011 1.005 - 1.030   pH 6.0 5.0 - 8.0   Glucose, UA NEGATIVE NEGATIVE mg/dL   Hgb urine dipstick NEGATIVE NEGATIVE   Bilirubin Urine NEGATIVE NEGATIVE   Ketones, ur 5 (A) NEGATIVE mg/dL   Protein, ur 30 (A) NEGATIVE mg/dL   Nitrite NEGATIVE NEGATIVE   Leukocytes,Ua NEGATIVE NEGATIVE   WBC, UA 0-5 0 - 5 WBC/hpf   Bacteria, UA NONE SEEN NONE SEEN   Squamous  Epithelial / LPF 0-5 0 - 5   Mucus PRESENT   Lactic acid, plasma  Result Value Ref Range   Lactic Acid, Venous 0.7 0.5 - 1.9 mmol/L  Lactic acid, plasma  Result Value Ref Range   Lactic Acid, Venous 0.9 0.5 - 1.9 mmol/L  Procalcitonin - Baseline  Result Value Ref Range   Procalcitonin <0.10 ng/mL  CBC  Result Value Ref Range   WBC 10.5 4.0 - 10.5 K/uL   RBC 2.89 (L) 3.87 - 5.11 MIL/uL   Hemoglobin 8.3 (L) 12.0 - 15.0 g/dL   HCT 25.1 (L) 36.0 - 46.0 %   MCV 86.9 80.0 - 100.0 fL   MCH 28.7 26.0 - 34.0 pg   MCHC 33.1 30.0 - 36.0 g/dL   RDW 17.2 (H) 11.5 - 15.5 %   Platelets 346 150 - 400 K/uL   nRBC 0.0 0.0 - 0.2 %  Comprehensive metabolic panel  Result Value Ref Range   Sodium 124 (L) 135 - 145 mmol/L   Potassium 3.7 3.5 - 5.1 mmol/L   Chloride 91 (L) 98 - 111 mmol/L   CO2 27 22 - 32 mmol/L   Glucose, Bld 114 (H) 70 - 99 mg/dL   BUN 10 8 - 23 mg/dL   Creatinine, Ser 0.52 0.44 - 1.00 mg/dL   Calcium 8.7 (L) 8.9 - 10.3 mg/dL   Total Protein 6.2 (L) 6.5 - 8.1 g/dL   Albumin 2.7 (L) 3.5 -  5.0 g/dL   AST 44 (H) 15 - 41 U/L   ALT 52 (H) 0 - 44 U/L   Alkaline Phosphatase 97 38 - 126 U/L   Total Bilirubin 0.5 0.3 - 1.2 mg/dL   GFR, Estimated >60 >60 mL/min   Anion gap 6 5 - 15  D-dimer, quantitative  Result Value Ref Range   D-Dimer, Quant 2.48 (H) 0.00 - 0.50 ug/mL-FEU  Sodium  Result Value Ref Range   Sodium 125 (L) 135 - 145 mmol/L  Magnesium  Result Value Ref Range   Magnesium 1.6 (L) 1.7 - 2.4 mg/dL  Phosphorus  Result Value Ref Range   Phosphorus 2.7 2.5 - 4.6 mg/dL  TSH  Result Value Ref Range   TSH 0.482 0.350 - 4.500 uIU/mL  T4, free  Result Value Ref Range   Free T4 1.01 0.61 - 1.12 ng/dL  Legionella Pneumophila Serogp 1 Ur Ag  Result Value Ref Range   L. pneumophila Serogp 1 Ur Ag Negative Negative   Source of Sample URINE, RANDOM   Strep pneumoniae urinary antigen  Result Value Ref Range   Strep Pneumo Urinary Antigen NEGATIVE NEGATIVE  Iron and  TIBC  Result Value Ref Range   Iron 17 (L) 28 - 170 ug/dL   TIBC 283 250 - 450 ug/dL   Saturation Ratios 6 (L) 10.4 - 31.8 %   UIBC 266 ug/dL  Ferritin  Result Value Ref Range   Ferritin 44 11 - 307 ng/mL  Technologist smear review  Result Value Ref Range   WBC MORPHOLOGY MORPHOLOGY UNREMARKABLE    RBC MORPHOLOGY HYPOCHROMIA    Plt Morphology DONE    Clinical Information anemia   CBC with Differential/Platelet  Result Value Ref Range   WBC 10.8 (H) 4.0 - 10.5 K/uL   RBC 2.85 (L) 3.87 - 5.11 MIL/uL   Hemoglobin 8.2 (L) 12.0 - 15.0 g/dL   HCT 24.9 (L) 36.0 - 46.0 %   MCV 87.4 80.0 - 100.0 fL   MCH 28.8 26.0 - 34.0 pg   MCHC 32.9 30.0 - 36.0 g/dL   RDW 17.4 (H) 11.5 - 15.5 %   Platelets 349 150 - 400 K/uL   nRBC 0.0 0.0 - 0.2 %   Neutrophils Relative % 79 %   Neutro Abs 8.4 (H) 1.7 - 7.7 K/uL   Lymphocytes Relative 14 %   Lymphs Abs 1.5 0.7 - 4.0 K/uL   Monocytes Relative 7 %   Monocytes Absolute 0.7 0.1 - 1.0 K/uL   Eosinophils Relative 0 %   Eosinophils Absolute 0.0 0.0 - 0.5 K/uL   Basophils Relative 0 %   Basophils Absolute 0.0 0.0 - 0.1 K/uL   Immature Granulocytes 0 %   Abs Immature Granulocytes 0.03 0.00 - 0.07 K/uL  CBC  Result Value Ref Range   WBC 8.3 4.0 - 10.5 K/uL   RBC 2.73 (L) 3.87 - 5.11 MIL/uL   Hemoglobin 7.7 (L) 12.0 - 15.0 g/dL   HCT 23.7 (L) 36.0 - 46.0 %   MCV 86.8 80.0 - 100.0 fL   MCH 28.2 26.0 - 34.0 pg   MCHC 32.5 30.0 - 36.0 g/dL   RDW 17.3 (H) 11.5 - 15.5 %   Platelets 343 150 - 400 K/uL   nRBC 0.0 0.0 - 0.2 %  Basic metabolic panel  Result Value Ref Range   Sodium 126 (L) 135 - 145 mmol/L   Potassium 4.3 3.5 - 5.1 mmol/L   Chloride 92 (L) 98 -  111 mmol/L   CO2 29 22 - 32 mmol/L   Glucose, Bld 97 70 - 99 mg/dL   BUN 16 8 - 23 mg/dL   Creatinine, Ser 0.54 0.44 - 1.00 mg/dL   Calcium 8.8 (L) 8.9 - 10.3 mg/dL   GFR, Estimated >60 >60 mL/min   Anion gap 5 5 - 15  Osmolality, urine  Result Value Ref Range   Osmolality, Ur 426 300 -  900 mOsm/kg  Osmolality  Result Value Ref Range   Osmolality 303 (H) 275 - 295 mOsm/kg  Sodium, urine, random  Result Value Ref Range   Sodium, Ur <10 mmol/L  Lipid panel  Result Value Ref Range   Cholesterol 100 0 - 200 mg/dL   Triglycerides 54 <150 mg/dL   HDL 48 >40 mg/dL   Total CHOL/HDL Ratio 2.1 RATIO   VLDL 11 0 - 40 mg/dL   LDL Cholesterol 41 0 - 99 mg/dL  CBC  Result Value Ref Range   WBC 6.8 4.0 - 10.5 K/uL   RBC 2.55 (L) 3.87 - 5.11 MIL/uL   Hemoglobin 7.2 (L) 12.0 - 15.0 g/dL   HCT 22.3 (L) 36.0 - 46.0 %   MCV 87.5 80.0 - 100.0 fL   MCH 28.2 26.0 - 34.0 pg   MCHC 32.3 30.0 - 36.0 g/dL   RDW 17.3 (H) 11.5 - 15.5 %   Platelets 378 150 - 400 K/uL   nRBC 0.0 0.0 - 0.2 %  Basic metabolic panel  Result Value Ref Range   Sodium 127 (L) 135 - 145 mmol/L   Potassium 4.1 3.5 - 5.1 mmol/L   Chloride 95 (L) 98 - 111 mmol/L   CO2 29 22 - 32 mmol/L   Glucose, Bld 123 (H) 70 - 99 mg/dL   BUN 20 8 - 23 mg/dL   Creatinine, Ser 0.53 0.44 - 1.00 mg/dL   Calcium 8.1 (L) 8.9 - 10.3 mg/dL   GFR, Estimated >60 >60 mL/min   Anion gap 3 (L) 5 - 15  Cortisol-am, blood  Result Value Ref Range   Cortisol - AM 7.1 6.7 - 22.6 ug/dL  Osmolality  Result Value Ref Range   Osmolality 277 275 - 295 mOsm/kg  CBC  Result Value Ref Range   WBC 7.1 4.0 - 10.5 K/uL   RBC 2.45 (L) 3.87 - 5.11 MIL/uL   Hemoglobin 7.1 (L) 12.0 - 15.0 g/dL   HCT 21.7 (L) 36.0 - 46.0 %   MCV 88.6 80.0 - 100.0 fL   MCH 29.0 26.0 - 34.0 pg   MCHC 32.7 30.0 - 36.0 g/dL   RDW 17.7 (H) 11.5 - 15.5 %   Platelets 343 150 - 400 K/uL   nRBC 0.0 0.0 - 0.2 %  Basic metabolic panel  Result Value Ref Range   Sodium 129 (L) 135 - 145 mmol/L   Potassium 4.5 3.5 - 5.1 mmol/L   Chloride 94 (L) 98 - 111 mmol/L   CO2 30 22 - 32 mmol/L   Glucose, Bld 102 (H) 70 - 99 mg/dL   BUN 20 8 - 23 mg/dL   Creatinine, Ser 0.50 0.44 - 1.00 mg/dL   Calcium 8.8 (L) 8.9 - 10.3 mg/dL   GFR, Estimated >60 >60 mL/min   Anion gap 5 5 -  15  ACTH stimulation, 3 time points  Result Value Ref Range   Cortisol, Base 3.1 ug/dL   Cortisol, 30 Min 17.1 ug/dL   Cortisol, 60 Min 23.8 ug/dL  Sodium, urine,  random  Result Value Ref Range   Sodium, Ur 52 mmol/L  Osmolality, urine  Result Value Ref Range   Osmolality, Ur 498 300 - 900 mOsm/kg  CBC  Result Value Ref Range   WBC 6.4 4.0 - 10.5 K/uL   RBC 2.47 (L) 3.87 - 5.11 MIL/uL   Hemoglobin 7.1 (L) 12.0 - 15.0 g/dL   HCT 21.9 (L) 36.0 - 46.0 %   MCV 88.7 80.0 - 100.0 fL   MCH 28.7 26.0 - 34.0 pg   MCHC 32.4 30.0 - 36.0 g/dL   RDW 17.7 (H) 11.5 - 15.5 %   Platelets 368 150 - 400 K/uL   nRBC 0.0 0.0 - 0.2 %  Basic metabolic panel  Result Value Ref Range   Sodium 132 (L) 135 - 145 mmol/L   Potassium 4.5 3.5 - 5.1 mmol/L   Chloride 94 (L) 98 - 111 mmol/L   CO2 31 22 - 32 mmol/L   Glucose, Bld 119 (H) 70 - 99 mg/dL   BUN 18 8 - 23 mg/dL   Creatinine, Ser 0.55 0.44 - 1.00 mg/dL   Calcium 9.0 8.9 - 10.3 mg/dL   GFR, Estimated >60 >60 mL/min   Anion gap 7 5 - 15  CBC  Result Value Ref Range   WBC 9.1 4.0 - 10.5 K/uL   RBC 3.70 (L) 3.87 - 5.11 MIL/uL   Hemoglobin 10.8 (L) 12.0 - 15.0 g/dL   HCT 31.8 (L) 36.0 - 46.0 %   MCV 85.9 80.0 - 100.0 fL   MCH 29.2 26.0 - 34.0 pg   MCHC 34.0 30.0 - 36.0 g/dL   RDW 16.9 (H) 11.5 - 15.5 %   Platelets 453 (H) 150 - 400 K/uL   nRBC 0.0 0.0 - 0.2 %  Basic metabolic panel  Result Value Ref Range   Sodium 133 (L) 135 - 145 mmol/L   Potassium 4.4 3.5 - 5.1 mmol/L   Chloride 90 (L) 98 - 111 mmol/L   CO2 33 (H) 22 - 32 mmol/L   Glucose, Bld 109 (H) 70 - 99 mg/dL   BUN 17 8 - 23 mg/dL   Creatinine, Ser 0.65 0.44 - 1.00 mg/dL   Calcium 9.6 8.9 - 10.3 mg/dL   GFR, Estimated >60 >60 mL/min   Anion gap 10 5 - 15  Hemoglobin and hematocrit, blood  Result Value Ref Range   Hemoglobin 9.8 (L) 12.0 - 15.0 g/dL   HCT 28.9 (L) 36.0 - 46.0 %  CBC  Result Value Ref Range   WBC 7.7 4.0 - 10.5 K/uL   RBC 3.22 (L) 3.87 - 5.11 MIL/uL    Hemoglobin 9.4 (L) 12.0 - 15.0 g/dL   HCT 27.6 (L) 36.0 - 46.0 %   MCV 85.7 80.0 - 100.0 fL   MCH 29.2 26.0 - 34.0 pg   MCHC 34.1 30.0 - 36.0 g/dL   RDW 16.9 (H) 11.5 - 15.5 %   Platelets 356 150 - 400 K/uL   nRBC 0.0 0.0 - 0.2 %  Basic metabolic panel  Result Value Ref Range   Sodium 130 (L) 135 - 145 mmol/L   Potassium 3.8 3.5 - 5.1 mmol/L   Chloride 89 (L) 98 - 111 mmol/L   CO2 34 (H) 22 - 32 mmol/L   Glucose, Bld 90 70 - 99 mg/dL   BUN 14 8 - 23 mg/dL   Creatinine, Ser 0.65 0.44 - 1.00 mg/dL   Calcium 8.9 8.9 - 10.3 mg/dL   GFR,  Estimated >60 >60 mL/min   Anion gap 7 5 - 15  CBC  Result Value Ref Range   WBC 9.8 4.0 - 10.5 K/uL   RBC 3.55 (L) 3.87 - 5.11 MIL/uL   Hemoglobin 10.2 (L) 12.0 - 15.0 g/dL   HCT 30.7 (L) 36.0 - 46.0 %   MCV 86.5 80.0 - 100.0 fL   MCH 28.7 26.0 - 34.0 pg   MCHC 33.2 30.0 - 36.0 g/dL   RDW 16.6 (H) 11.5 - 15.5 %   Platelets 400 150 - 400 K/uL   nRBC 0.0 0.0 - 0.2 %  Basic metabolic panel  Result Value Ref Range   Sodium 130 (L) 135 - 145 mmol/L   Potassium 3.6 3.5 - 5.1 mmol/L   Chloride 87 (L) 98 - 111 mmol/L   CO2 35 (H) 22 - 32 mmol/L   Glucose, Bld 102 (H) 70 - 99 mg/dL   BUN 26 (H) 8 - 23 mg/dL   Creatinine, Ser 0.72 0.44 - 1.00 mg/dL   Calcium 8.8 (L) 8.9 - 10.3 mg/dL   GFR, Estimated >60 >60 mL/min   Anion gap 8 5 - 15  ECHOCARDIOGRAM COMPLETE  Result Value Ref Range   Weight 1,566.15 oz   Height 53 in   BP 177/66 mmHg   Ao pk vel 1.08 m/s   AV Area VTI 1.21 cm2   AR max vel 1.27 cm2   AV Mean grad 3.0 mmHg   AV Peak grad 4.7 mmHg   S' Lateral 2.00 cm   AV Area mean vel 1.24 cm2   Area-P 1/2 5.09 cm2  Type and screen [facility]  Result Value Ref Range   ABO/RH(D) A NEG    Antibody Screen NEG    Sample Expiration 01/20/2022,2359    Unit Number [id number]    Blood Component Type RED CELLS,LR    Unit division 00    Status of Unit ISSUED,FINAL    Transfusion Status OK TO TRANSFUSE    Crossmatch  Result      Compatible Performed at [facility], [address], [place], [place] [address]   Prepare RBC (crossmatch)  Result Value Ref Range   Order Confirmation      ORDER PROCESSED BY BLOOD BANK Performed at [facility], [address], [place],  [address]   ABO/Rh  Result Value Ref Range   ABO/RH(D)      A NEG Performed at [facility], [address] [place], [place] [address]   BPAM RBC  Result Value Ref Range   ISSUE DATE / TIME [id number]    Blood Product Unit Number [id number]    PRODUCT CODE [id number]    Unit Type and Rh 0600    Blood Product Expiration Date 748270786754   Troponin I (High Sensitivity)  Result Value Ref Range   Troponin I (High Sensitivity) 9 <18 ng/L  Troponin I (High Sensitivity)  Result Value Ref Range   Troponin I (High Sensitivity) 10 <18 ng/L      Assessment & Plan:   Problem List Items Addressed This Visit       Cardiovascular and Mediastinum   Atrial fibrillation with RVR (HCC)    Chronic, ongoing, followed by Dr. Humphrey Rolls with recent stress test done, will attempt to obtain records on this.  No current long term anticoagulant in place -- taking ASA and Plavix + BB.      Relevant Orders   Comprehensive metabolic panel   Chronic  congestive heart failure with right ventricular diastolic dysfunction (HCC) - Primary    Chronic, ongoing.  Continue collaboration with cardiology.  Will attempt to obtain recent notes.  Continue current medications.      Relevant Orders   Comprehensive metabolic panel     Respiratory   Chronic respiratory failure with hypoxia (HCC)    Remains on long term O2 at 3L presently by [place].  Continue this and collaboration with pulmonary.        Relevant Orders   CBC with Differential/Platelet   COPD, severe ([place])    Chronic, ongoing with recent exacerbation.  Symptoms improved at this time.  Continue current medication regimen and collaboration with pulmonary.  Is on  long term oxygen, wearing this today.      Relevant Medications   nicotine (NICODERM CQ - DOSED IN MG/24 HOURS) 14 mg/24hr patch   Other Relevant Orders   CBC with Differential/Platelet   Comprehensive metabolic panel   Pneumonia of left lower lobe due to infectious organism    Overall improving at this time -- monitor closely.  Repeat labs and recommend to ensure follow-up with pulmonary.      Relevant Orders   CBC with Differential/Platelet     Nervous and Auditory   Nicotine dependence, cigarettes, w unsp disorders    Currently no smoking since hospitalization -- sent in refills on patches and recommend continued cessation.      Relevant Medications   nicotine (NICODERM CQ - DOSED IN MG/24 HOURS) 14 mg/24hr patch     Other   Depression with anxiety    Ongoing in presence of chronic diseases.  Denies SI/HI.  At this time continue Mirtazapine for both mood and weight + add on Buspar 5 MG BID.  Educated patient and family on this medication and use + side effects, they agree with this plan of care.        Relevant Medications   busPIRone (BUSPAR) 5 MG tablet   Iron deficiency anemia    Ongoing recent labs, was to see GI outpatient, referral placed today.  Repeat labs obtained: CBC, ferritin, iron, folate.        Relevant Orders   CBC with Differential/Platelet   Vitamin B12   Iron Binding Cap (TIBC)([facility])   Ferritin   Folate   Ambulatory referral to Gastroenterology   Malnutrition of moderate degree    Ongoing in presence of severe COPD.  She does not like supplement drinks, recommend trying to freeze these and turn into ice cream, as she likes ice cream.  Discussed with her and daughter importance of protein supplements to assist with weight.  Continue Mirtazapine nightly.  Monitor weight closely.  Referral to GI.      Relevant Orders   Vitamin B12   Ambulatory referral to Gastroenterology   Unintentional weight loss    Ongoing in presence of severe COPD.   She does not like supplement drinks, recommend trying to freeze these and turn into ice cream, as she likes ice cream.  Discussed with her and daughter importance of protein supplements to assist with weight.  Continue Mirtazapine nightly.  Monitor weight closely.  Referral to GI.      Relevant Orders   Comprehensive metabolic panel   Magnesium   Vitamin B12   Ambulatory referral to Gastroenterology   Other Visit Diagnoses     Hypomagnesemia       Noted on hospital labs, check today and start supplement as needed.   Relevant Orders  Magnesium   Vitamin D deficiency       Recheck level today and start supplement as needed.   Relevant Orders   VITAMIN D 25 Hydroxy (Vit-D Deficiency, Fractures)        Follow up plan: Return in about 4 weeks (around 03/02/2022) for COPD, MOOD, WEIGHT LOSS, A-FIB.

## 2022-02-02 NOTE — Patient Instructions (Signed)
Visit Information  Thank you for taking time to visit with me today. Please don't hesitate to contact me if I can be of assistance to you before our next scheduled telephone appointment.  Following are the goals we discussed today:  Monitor blood pressure and heart rate daily, record readings and share with provider.   Our next appointment is by telephone on 12/14  Please call the care guide team at [phone] if you need to cancel or reschedule your appointment.   Please call the Suicide and Crisis Lifeline: 988 call the [place] National Suicide Prevention Lifeline: 939 267 7815 or TTY: (309)630-4395 TTY (831)049-8674) to talk to a trained counselor call 1-800-273-TALK (toll free, 24 hour hotline) call 911 if you are experiencing a Mental Health or [place] or need someone to talk to.  Patient verbalizes understanding of instructions and care plan provided today and agrees to view in [place]. Active MyChart status and patient understanding of how to access instructions and care plan via MyChart confirmed with patient.     The patient has been provided with contact information for the care management team and has been advised to call with any health related questions or concerns.   Valente David, RN, MSN, [place] [facility] Management Care Management Coordinator [phone]

## 2022-02-02 NOTE — Assessment & Plan Note (Signed)
Chronic, ongoing, followed by Dr. Humphrey Rolls with recent stress test done, will attempt to obtain records on this.  No current long term anticoagulant in place -- taking ASA and Plavix + BB.

## 2022-02-02 NOTE — Assessment & Plan Note (Signed)
Ongoing in presence of severe COPD.  She does not like supplement drinks, recommend trying to freeze these and turn into ice cream, as she likes ice cream.  Discussed with her and daughter importance of protein supplements to assist with weight.  Continue Mirtazapine nightly.  Monitor weight closely.  Referral to GI.

## 2022-02-02 NOTE — Assessment & Plan Note (Signed)
Ongoing in presence of chronic diseases.  Denies SI/HI.  At this time continue Mirtazapine for both mood and weight + add on Buspar 5 MG BID.  Educated patient and family on this medication and use + side effects, they agree with this plan of care.

## 2022-02-02 NOTE — Assessment & Plan Note (Signed)
Overall improving at this time -- monitor closely.  Repeat labs and recommend to ensure follow-up with pulmonary.

## 2022-02-02 NOTE — Assessment & Plan Note (Signed)
Chronic, ongoing.  Continue collaboration with cardiology.  Will attempt to obtain recent notes.  Continue current medications.

## 2022-02-02 NOTE — Patient Outreach (Signed)
  Care Coordination   Initial Visit Note   02/02/2022 Name: SHANEEQUA BAHNER MRN: [id number] DOB: [birth year]  DEANNIE RESETAR is a 71 y.o. year old female who sees [name], DO for primary care. I spoke with  [name] by phone today.  What matters to the patients health and wellness today?  Admitted to hospital 11/9-11/17 for COPD, newly diagnosed with A-fib.    Goals Addressed             This Visit's Progress    Effective management of heart conditions       Care Coordination Interventions: Counseled on increased risk of stroke due to Afib and benefits of anticoagulation for stroke prevention Afib action plan reviewed Assessed social determinant of health barriers Evaluation of current treatment plan related to hypertension self management and patient's adherence to plan as established by provider Reviewed medications with patient and discussed importance of compliance Discussed plans with patient for ongoing care management follow up and provided patient with direct contact information for care management team Reviewed scheduled/upcoming provider appointments including:  Discussed complications of poorly controlled blood pressure such as heart disease, stroke, circulatory complications, vision complications, kidney impairment, sexual dysfunction Discussed follow up with PCP office, scheduled for today Reviewed BP readings, Monday it was 178/64 HR 61.  Denies any current chest pain or shortness of breath.  On oxygen at home Confirmed [facility] has started for PT and OT. Encouraged to monitor BP and HR daily, recording readings to share with provider.           SDOH assessments and interventions completed:  Yes  SDOH Interventions Today    Flowsheet Row Most Recent Value  SDOH Interventions   Food Insecurity Interventions Intervention Not Indicated  Housing Interventions Intervention Not Indicated  Transportation Interventions Intervention Not  Indicated  Utilities Interventions Intervention Not Indicated        Care Coordination Interventions:  Yes, provided   Follow up plan: Follow up call scheduled for 12/14    Encounter Outcome:  Pt. Visit Completed   Roderick Pee, MSN, [place] Management Care Management Coordinator [phone]

## 2022-02-03 ENCOUNTER — Ambulatory Visit: Payer: Medicare Other | Admitting: Family Medicine

## 2022-02-03 LAB — COMPREHENSIVE METABOLIC PANEL
ALT: 26 IU/L (ref 0–32)
AST: 24 IU/L (ref 0–40)
Albumin/Globulin Ratio: 1.7 (ref 1.2–2.2)
Albumin: 3.6 g/dL — ABNORMAL LOW (ref 3.8–4.8)
Alkaline Phosphatase: 80 IU/L (ref 44–121)
BUN/Creatinine Ratio: 21 (ref 12–28)
BUN: 14 mg/dL (ref 8–27)
Bilirubin Total: 0.3 mg/dL (ref 0.0–1.2)
CO2: 29 mmol/L (ref 20–29)
Calcium: 8.9 mg/dL (ref 8.7–10.3)
Chloride: 96 mmol/L (ref 96–106)
Creatinine, Ser: 0.66 mg/dL (ref 0.57–1.00)
Globulin, Total: 2.1 g/dL (ref 1.5–4.5)
Glucose: 87 mg/dL (ref 70–99)
Potassium: 3.4 mmol/L — ABNORMAL LOW (ref 3.5–5.2)
Sodium: 137 mmol/L (ref 134–144)
Total Protein: 5.7 g/dL — ABNORMAL LOW (ref 6.0–8.5)
eGFR: 93 mL/min/{1.73_m2} (ref >59)

## 2022-02-03 LAB — CBC WITH DIFFERENTIAL/PLATELET
Basophils Absolute: 0 10*3/uL (ref 0.0–0.2)
Basos: 0 %
EOS (ABSOLUTE): 0 10*3/uL (ref 0.0–0.4)
Eos: 0 %
Hematocrit: 33.9 % — ABNORMAL LOW (ref 34.0–46.6)
Hemoglobin: 11.4 g/dL (ref 11.1–15.9)
Immature Grans (Abs): 0 10*3/uL (ref 0.0–0.1)
Immature Granulocytes: 0 %
Lymphocytes Absolute: 2 10*3/uL (ref 0.7–3.1)
Lymphs: 20 %
MCH: 29.5 pg (ref 26.6–33.0)
MCHC: 33.6 g/dL (ref 31.5–35.7)
MCV: 88 fL (ref 79–97)
Monocytes Absolute: 0.7 10*3/uL (ref 0.1–0.9)
Monocytes: 8 %
Neutrophils Absolute: 7.2 10*3/uL — ABNORMAL HIGH (ref 1.4–7.0)
Neutrophils: 72 %
Platelets: 263 10*3/uL (ref 150–450)
RBC: 3.87 x10E6/uL (ref 3.77–5.28)
RDW: 15.2 % (ref 11.7–15.4)
WBC: 9.9 10*3/uL (ref 3.4–10.8)

## 2022-02-03 LAB — FERRITIN: Ferritin: 101 ng/mL (ref 15–150)

## 2022-02-03 LAB — IRON AND TIBC
Iron Saturation: 23 % (ref 15–55)
Iron: 54 ug/dL (ref 27–139)
Total Iron Binding Capacity: 239 ug/dL — ABNORMAL LOW (ref 250–450)
UIBC: 185 ug/dL (ref 118–369)

## 2022-02-03 LAB — FOLATE: Folate: >20 ng/mL (ref >3.0)

## 2022-02-03 LAB — MAGNESIUM: Magnesium: 1.8 mg/dL (ref 1.6–2.3)

## 2022-02-03 LAB — VITAMIN D 25 HYDROXY (VIT D DEFICIENCY, FRACTURES): Vit D, 25-Hydroxy: 36.6 ng/mL (ref 30.0–100.0)

## 2022-02-03 LAB — VITAMIN B12: Vitamin B-12: 1056 pg/mL (ref 232–1245)

## 2022-02-03 NOTE — Progress Notes (Signed)
Contacted via [place] -- please call to make sure patient or daughter saw: Good afternoon Kristi Clark, your labs have returned: - Anemia has improved this check -- including iron level, this is good news. - Kidney function, creatinine and eGFR, remains normal, as is liver function, AST and ALT. Potassium a little low, I recommend adding some potassium rich foods to diet until you see [place] next and we will recheck -- this includes bananas, mangoes, nuts, dried fruit.  If continues to be on low side next check we will add on supplement. - Overall these labs look better then previous.  Continue all current medications and keep follow-up with Dr. Wynetta Emery.  Any questions? Keep being amazing!!  Thank you for allowing me to participate in your care.  I appreciate you. Kindest regards, Aayan Haskew

## 2022-02-07 ENCOUNTER — Telehealth: Payer: Self-pay | Admitting: Family Medicine

## 2022-02-07 DIAGNOSIS — J441 Chronic obstructive pulmonary disease with (acute) exacerbation: Secondary | ICD-10-CM | POA: Diagnosis not present

## 2022-02-07 DIAGNOSIS — N182 Chronic kidney disease, stage 2 (mild): Secondary | ICD-10-CM | POA: Diagnosis not present

## 2022-02-07 DIAGNOSIS — I509 Heart failure, unspecified: Secondary | ICD-10-CM | POA: Diagnosis not present

## 2022-02-07 DIAGNOSIS — J9621 Acute and chronic respiratory failure with hypoxia: Secondary | ICD-10-CM | POA: Diagnosis not present

## 2022-02-07 DIAGNOSIS — I130 Hypertensive heart and chronic kidney disease with heart failure and stage 1 through stage 4 chronic kidney disease, or unspecified chronic kidney disease: Secondary | ICD-10-CM | POA: Diagnosis not present

## 2022-02-07 DIAGNOSIS — D631 Anemia in chronic kidney disease: Secondary | ICD-10-CM | POA: Diagnosis not present

## 2022-02-07 NOTE — Telephone Encounter (Signed)
OK for verbal hours

## 2022-02-07 NOTE — Telephone Encounter (Signed)
Home Health Verbal Orders - Caller/Agency: Araceli Bouche with [facility] Number: [phone] Requesting PT Frequency: 1 x wk 5  Please assist patient further

## 2022-02-07 NOTE — Telephone Encounter (Signed)
Spoke with Araceli Bouche with [facility] to provide verbal OK for verbal orders per Dr.Johnson.

## 2022-02-08 ENCOUNTER — Ambulatory Visit: Payer: Self-pay | Admitting: *Deleted

## 2022-02-08 ENCOUNTER — Ambulatory Visit: Payer: Medicare Other | Admitting: Family Medicine

## 2022-02-08 DIAGNOSIS — D631 Anemia in chronic kidney disease: Secondary | ICD-10-CM | POA: Diagnosis not present

## 2022-02-08 DIAGNOSIS — I130 Hypertensive heart and chronic kidney disease with heart failure and stage 1 through stage 4 chronic kidney disease, or unspecified chronic kidney disease: Secondary | ICD-10-CM | POA: Diagnosis not present

## 2022-02-08 DIAGNOSIS — N182 Chronic kidney disease, stage 2 (mild): Secondary | ICD-10-CM | POA: Diagnosis not present

## 2022-02-08 DIAGNOSIS — E782 Mixed hyperlipidemia: Secondary | ICD-10-CM | POA: Diagnosis not present

## 2022-02-08 DIAGNOSIS — I340 Nonrheumatic mitral (valve) insufficiency: Secondary | ICD-10-CM | POA: Diagnosis not present

## 2022-02-08 DIAGNOSIS — I2510 Atherosclerotic heart disease of native coronary artery without angina pectoris: Secondary | ICD-10-CM | POA: Diagnosis not present

## 2022-02-08 DIAGNOSIS — J441 Chronic obstructive pulmonary disease with (acute) exacerbation: Secondary | ICD-10-CM | POA: Diagnosis not present

## 2022-02-08 DIAGNOSIS — R0602 Shortness of breath: Secondary | ICD-10-CM | POA: Diagnosis not present

## 2022-02-08 DIAGNOSIS — J9621 Acute and chronic respiratory failure with hypoxia: Secondary | ICD-10-CM | POA: Diagnosis not present

## 2022-02-08 DIAGNOSIS — I509 Heart failure, unspecified: Secondary | ICD-10-CM | POA: Diagnosis not present

## 2022-02-08 DIAGNOSIS — I10 Essential (primary) hypertension: Secondary | ICD-10-CM | POA: Diagnosis not present

## 2022-02-08 NOTE — Telephone Encounter (Signed)
  Chief Complaint: Hypotension Symptoms: Asymptomatic. Frequency: Trending low past 2 weeks Pertinent Negatives: Patient denies any symptoms; no dizziness, no orthostatic symptoms,no blurred vision,headache Disposition: '[]'$ ED /'[]'$ Urgent Care (no appt availability in office) / '[]'$ Appointment(In office/virtual)/ '[]'$  [facility]/ '[]'$ Home Care/ '[]'$ Refused Recommended Disposition /'[]'$ [facility]/ '[x]'$  Follow-up with PCP Additional Notes: Manuela Schwartz, [place] nurse calling to report BP ranging from 173/61-152/60 last week, this weeks values 121/49, 138/57, 142/55 and today 98/68. Hr 70s consistently.  Pt is asymptomatic. No appts available within protocol timeframe. Care advise provided.  Please advise. CB# [phone]  Reason for Disposition  [0] Systolic BP 13-143 AND [8] taking blood pressure medications AND [3] NOT dizzy, lightheaded or weak  Answer Assessment - Initial Assessment Questions 1. BLOOD PRESSURE: "What is the blood pressure?" "Did you take at least two measurements 5 minutes apart?"     Past week ranging from 98/68-142/55 2. ONSET: "When did you take your blood pressure?"     Today, Home Health nurse 3. HOW: "How did you obtain the blood pressure?" (e.g., visiting nurse, automatic home BP monitor)     manually 4. HISTORY: "Do you have a history of low blood pressure?" "What is your blood pressure normally?"     no 5. MEDICINES: "Are you taking any medications for blood pressure?" If Yes, ask: "Have they been changed recently?"     yes 6. PULSE RATE: "Do you know what your pulse rate is?"      70's 7. OTHER SYMPTOMS: "Have you been sick recently?" "Have you had a recent injury?"  Protocols used: Blood Pressure - Low-A-AH

## 2022-02-10 ENCOUNTER — Ambulatory Visit (INDEPENDENT_AMBULATORY_CARE_PROVIDER_SITE_OTHER): Payer: Medicare Other | Admitting: Family Medicine

## 2022-02-10 ENCOUNTER — Encounter: Payer: Self-pay | Admitting: Family Medicine

## 2022-02-10 VITALS — BP 122/57 | HR 69 | Temp 97.7°F | Ht <= 58 in | Wt 88.0 lb

## 2022-02-10 DIAGNOSIS — J441 Chronic obstructive pulmonary disease with (acute) exacerbation: Secondary | ICD-10-CM | POA: Diagnosis not present

## 2022-02-10 DIAGNOSIS — I700 Atherosclerosis of aorta: Secondary | ICD-10-CM | POA: Diagnosis not present

## 2022-02-10 DIAGNOSIS — I10 Essential (primary) hypertension: Secondary | ICD-10-CM | POA: Diagnosis not present

## 2022-02-10 DIAGNOSIS — F418 Other specified anxiety disorders: Secondary | ICD-10-CM

## 2022-02-10 DIAGNOSIS — F32A Depression, unspecified: Secondary | ICD-10-CM | POA: Diagnosis not present

## 2022-02-10 DIAGNOSIS — J9611 Chronic respiratory failure with hypoxia: Secondary | ICD-10-CM | POA: Diagnosis not present

## 2022-02-10 DIAGNOSIS — J309 Allergic rhinitis, unspecified: Secondary | ICD-10-CM | POA: Diagnosis not present

## 2022-02-10 DIAGNOSIS — Z7902 Long term (current) use of antithrombotics/antiplatelets: Secondary | ICD-10-CM | POA: Diagnosis not present

## 2022-02-10 DIAGNOSIS — R1310 Dysphagia, unspecified: Secondary | ICD-10-CM | POA: Diagnosis not present

## 2022-02-10 DIAGNOSIS — I4891 Unspecified atrial fibrillation: Secondary | ICD-10-CM | POA: Diagnosis not present

## 2022-02-10 DIAGNOSIS — R634 Abnormal weight loss: Secondary | ICD-10-CM | POA: Diagnosis not present

## 2022-02-10 DIAGNOSIS — Z7951 Long term (current) use of inhaled steroids: Secondary | ICD-10-CM | POA: Diagnosis not present

## 2022-02-10 DIAGNOSIS — I952 Hypotension due to drugs: Secondary | ICD-10-CM

## 2022-02-10 DIAGNOSIS — I509 Heart failure, unspecified: Secondary | ICD-10-CM | POA: Diagnosis not present

## 2022-02-10 DIAGNOSIS — E785 Hyperlipidemia, unspecified: Secondary | ICD-10-CM | POA: Diagnosis not present

## 2022-02-10 DIAGNOSIS — R413 Other amnesia: Secondary | ICD-10-CM | POA: Diagnosis not present

## 2022-02-10 DIAGNOSIS — R410 Disorientation, unspecified: Secondary | ICD-10-CM

## 2022-02-10 DIAGNOSIS — I5082 Biventricular heart failure: Secondary | ICD-10-CM

## 2022-02-10 DIAGNOSIS — D631 Anemia in chronic kidney disease: Secondary | ICD-10-CM | POA: Diagnosis not present

## 2022-02-10 DIAGNOSIS — R296 Repeated falls: Secondary | ICD-10-CM | POA: Diagnosis not present

## 2022-02-10 DIAGNOSIS — I5032 Chronic diastolic (congestive) heart failure: Secondary | ICD-10-CM

## 2022-02-10 DIAGNOSIS — R54 Age-related physical debility: Secondary | ICD-10-CM | POA: Diagnosis not present

## 2022-02-10 DIAGNOSIS — E440 Moderate protein-calorie malnutrition: Secondary | ICD-10-CM | POA: Diagnosis not present

## 2022-02-10 DIAGNOSIS — E871 Hypo-osmolality and hyponatremia: Secondary | ICD-10-CM | POA: Diagnosis not present

## 2022-02-10 DIAGNOSIS — N182 Chronic kidney disease, stage 2 (mild): Secondary | ICD-10-CM | POA: Diagnosis not present

## 2022-02-10 DIAGNOSIS — I2510 Atherosclerotic heart disease of native coronary artery without angina pectoris: Secondary | ICD-10-CM | POA: Diagnosis not present

## 2022-02-10 DIAGNOSIS — J449 Chronic obstructive pulmonary disease, unspecified: Secondary | ICD-10-CM

## 2022-02-10 DIAGNOSIS — J9621 Acute and chronic respiratory failure with hypoxia: Secondary | ICD-10-CM | POA: Diagnosis not present

## 2022-02-10 DIAGNOSIS — K219 Gastro-esophageal reflux disease without esophagitis: Secondary | ICD-10-CM | POA: Diagnosis not present

## 2022-02-10 DIAGNOSIS — Z9981 Dependence on supplemental oxygen: Secondary | ICD-10-CM | POA: Diagnosis not present

## 2022-02-10 DIAGNOSIS — R911 Solitary pulmonary nodule: Secondary | ICD-10-CM | POA: Diagnosis not present

## 2022-02-10 DIAGNOSIS — I130 Hypertensive heart and chronic kidney disease with heart failure and stage 1 through stage 4 chronic kidney disease, or unspecified chronic kidney disease: Secondary | ICD-10-CM | POA: Diagnosis not present

## 2022-02-10 DIAGNOSIS — F17219 Nicotine dependence, cigarettes, with unspecified nicotine-induced disorders: Secondary | ICD-10-CM | POA: Diagnosis not present

## 2022-02-10 MED ORDER — AMLODIPINE BESYLATE 5 MG PO TABS: 5.0000 mg | ORAL_TABLET | Freq: Every day | ORAL | 1 refills | Status: DC

## 2022-02-10 MED ORDER — QUETIAPINE FUMARATE 25 MG PO TABS: 12.5000 mg | ORAL_TABLET | Freq: Every day | ORAL | 1 refills | Status: DC

## 2022-02-10 NOTE — Assessment & Plan Note (Addendum)
Stable on oxygen. Lungs clear today. Tachypnea seems to be more from anxiety than COPD. Will treat anxiety and recheck 1 week. Given current course of illness, life expectancy could be expected to be less than 6 months. Referral to hospice made today.

## 2022-02-10 NOTE — Assessment & Plan Note (Signed)
BP running low. Will cut amlodipine in 1/2 and recheck 1 week. Call with any concerns.

## 2022-02-10 NOTE — Assessment & Plan Note (Signed)
Continue to follow with cardiology. Euvolemic today. Given current course of illness, life expectancy could be expected to be less than 6 months. Referral to hospice made today.

## 2022-02-10 NOTE — Progress Notes (Signed)
BP (!) 122/57   Pulse 69   Temp 97.7 F (36.5 C) (Oral)   Ht _0  (1.321 m)   Wt 88 lb (39.9 kg)   LMP  (LMP Unknown)   SpO2 99%   BMI 22.88 kg/m    Subjective:    Patient ID: Kristi Clark, female    DOB: 02/04/1951, 71 y.o.   MRN: [id number]  HPI: Kristi Clark is a 71 y.o. female  Chief Complaint  Patient presents with   Hypotension    Patient niece since hospitalization patient has started 2-3 medications. Patient says she may be taking too many as it may be interfering with her blood pressure.    Medication Management    Patient niece says patient is becoming more agitated and would like to discuss medications with provider.   Kristi Clark presents today with her daughters. She has not been doing well. She has been incredibly anxious and tremulous. She has had a change in her behavior and mood in the past couple of weeks. The home care nurse concerned about diastolic BP- running in the high 40s-60s. She notes that she has been very dizzy. She is feeling like she is going to die and is very anxious. Not able to answer many questions today.   Relevant past medical, surgical, family and social history reviewed and updated as indicated. Interim medical history since our last visit reviewed. Allergies and medications reviewed and updated.  Review of Systems  Constitutional: Negative.   Respiratory: Negative.    Cardiovascular: Negative.   Gastrointestinal: Negative.   Genitourinary: Negative.   Musculoskeletal: Negative.   Skin: Negative.   Neurological:  Positive for dizziness and light-headedness. Negative for tremors, seizures, syncope, facial asymmetry, speech difficulty, weakness, numbness and headaches.  Psychiatric/Behavioral:  Positive for agitation, behavioral problems, confusion, dysphoric mood and sleep disturbance. Negative for decreased concentration, hallucinations, self-injury and suicidal ideas. The patient is nervous/anxious. The patient is not  hyperactive.     Per HPI unless specifically indicated above     Objective:    BP (!) 122/57   Pulse 69   Temp 97.7 F (36.5 C) (Oral)   Ht _1  (1.321 m)   Wt 88 lb (39.9 kg)   LMP  (LMP Unknown)   SpO2 99%   BMI 22.88 kg/m   Wt Readings from Last 3 Encounters:  02/10/22 88 lb (39.9 kg)  02/02/22 90 lb 4.8 oz (41 kg)  01/19/22 97 lb 14.4 oz (44.4 kg)    Physical Exam Vitals and nursing note reviewed.  Constitutional:      General: She is not in acute distress.    Appearance: Normal appearance. She is normal weight. She is not ill-appearing, toxic-appearing or diaphoretic.  HENT:     Head: Normocephalic and atraumatic.     Right Ear: External ear normal.     Left Ear: External ear normal.     Nose: Nose normal.     Mouth/Throat:     Mouth: Mucous membranes are moist.     Pharynx: Oropharynx is clear.  Eyes:     General: No scleral icterus.       Right eye: No discharge.        Left eye: No discharge.     Extraocular Movements: Extraocular movements intact.     Conjunctiva/sclera: Conjunctivae normal.     Pupils: Pupils are equal, round, and reactive to light.  Cardiovascular:     Rate and Rhythm: Normal rate and regular rhythm.  Pulses: Normal pulses.     Heart sounds: Normal heart sounds. No murmur heard.    No friction rub. No gallop.  Pulmonary:     Effort: Tachypnea present. No respiratory distress.     Breath sounds: Normal breath sounds. No stridor. No wheezing, rhonchi or rales.  Chest:     Chest wall: No tenderness.  Musculoskeletal:        General: Normal range of motion.     Cervical back: Normal range of motion and neck supple.  Skin:    General: Skin is warm and dry.     Capillary Refill: Capillary refill takes less than 2 seconds.     Coloration: Skin is not jaundiced or pale.     Findings: No bruising, erythema, lesion or rash.  Neurological:     General: No focal deficit present.     Mental Status: She is alert and oriented to person,  place, and time. Mental status is at baseline.  Psychiatric:        Mood and Affect: Mood is anxious.        Behavior: Behavior is agitated.        Cognition and Memory: Cognition is impaired.     Results for orders placed or performed in visit on 02/02/22  CBC with Differential/Platelet  Result Value Ref Range   WBC 9.9 3.4 - 10.8 x10E3/uL   RBC 3.87 3.77 - 5.28 x10E6/uL   Hemoglobin 11.4 11.1 - 15.9 g/dL   Hematocrit 33.9 (L) 34.0 - 46.6 %   MCV 88 79 - 97 fL   MCH 29.5 26.6 - 33.0 pg   MCHC 33.6 31.5 - 35.7 g/dL   RDW 15.2 11.7 - 15.4 %   Platelets 263 150 - 450 x10E3/uL   Neutrophils 72 Not Estab. %   Lymphs 20 Not Estab. %   Monocytes 8 Not Estab. %   Eos 0 Not Estab. %   Basos 0 Not Estab. %   Neutrophils Absolute 7.2 (H) 1.4 - 7.0 x10E3/uL   Lymphocytes Absolute 2.0 0.7 - 3.1 x10E3/uL   Monocytes Absolute 0.7 0.1 - 0.9 x10E3/uL   EOS (ABSOLUTE) 0.0 0.0 - 0.4 x10E3/uL   Basophils Absolute 0.0 0.0 - 0.2 x10E3/uL   Immature Granulocytes 0 Not Estab. %   Immature Grans (Abs) 0.0 0.0 - 0.1 x10E3/uL  Comprehensive metabolic panel  Result Value Ref Range   Glucose 87 70 - 99 mg/dL   BUN 14 8 - 27 mg/dL   Creatinine, Ser 0.66 0.57 - 1.00 mg/dL   eGFR 93 >59 mL/min/1.73   BUN/Creatinine Ratio 21 12 - 28   Sodium 137 134 - 144 mmol/L   Potassium 3.4 (L) 3.5 - 5.2 mmol/L   Chloride 96 96 - 106 mmol/L   CO2 29 20 - 29 mmol/L   Calcium 8.9 8.7 - 10.3 mg/dL   Total Protein 5.7 (L) 6.0 - 8.5 g/dL   Albumin 3.6 (L) 3.8 - 4.8 g/dL   Globulin, Total 2.1 1.5 - 4.5 g/dL   Albumin/Globulin Ratio 1.7 1.2 - 2.2   Bilirubin Total 0.3 0.0 - 1.2 mg/dL   Alkaline Phosphatase 80 44 - 121 IU/L   AST 24 0 - 40 IU/L   ALT 26 0 - 32 IU/L  Magnesium  Result Value Ref Range   Magnesium 1.8 1.6 - 2.3 mg/dL  VITAMIN D 25 Hydroxy (Vit-D Deficiency, Fractures)  Result Value Ref Range   Vit D, 25-Hydroxy 36.6 30.0 - 100.0 ng/mL  Vitamin  B12  Result Value Ref Range   Vitamin B-12 1,056 232  - 1,245 pg/mL  Iron Binding Cap (TIBC)([facility])  Result Value Ref Range   Total Iron Binding Capacity 239 (L) 250 - 450 ug/dL   UIBC 185 118 - 369 ug/dL   Iron 54 27 - 139 ug/dL   Iron Saturation 23 15 - 55 %  Ferritin  Result Value Ref Range   Ferritin 101 15 - 150 ng/mL  Folate  Result Value Ref Range   Folate >20.0 >3.0 ng/mL      Assessment & Plan:   Problem List Items Addressed This Visit       Cardiovascular and Mediastinum   Essential hypertension    BP running low. Will cut amlodipine in 1/2 and recheck 1 week. Call with any concerns.       Relevant Medications   amLODipine (NORVASC) 5 MG tablet   CAD (coronary artery disease)    Continue to follow with cardiology. Euvolemic today. Given current course of illness, life expectancy could be expected to be less than 6 months. Referral to hospice made today.      Relevant Medications   amLODipine (NORVASC) 5 MG tablet   Aortic atherosclerosis (HCC)    Will keep BP and cholesterol under good control. Continue to monitor. Call with any concerns.       Relevant Medications   amLODipine (NORVASC) 5 MG tablet   Other Relevant Orders   Ambulatory referral to Hospice   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 ([place])    Continue to follow with cardiology. Euvolemic today. Given current course of illness, life expectancy could be expected to be less than 6 months. Referral to hospice made today.      Relevant Medications   amLODipine (NORVASC) 5 MG tablet   Other Relevant Orders   Ambulatory referral to Hospice   Atrial fibrillation with RVR ([place])    Continue to follow with cardiology. Euvolemic today. Given current course of illness, life expectancy could be expected to be less than 6 months. Referral to hospice made today.      Relevant Medications   amLODipine (NORVASC) 5 MG tablet   Hypotension due to drugs    Will cut amlodipine in 1/2 and recheck 1 week. Call with  any concerns.       Relevant Medications   amLODipine (NORVASC) 5 MG tablet     Respiratory   COPD, severe (HCC)    Stable on oxygen. Lungs clear today. Tachypnea seems to be more from anxiety than COPD. Will treat anxiety and recheck 1 week. Given current course of illness, life expectancy could be expected to be less than 6 months. Referral to hospice made today.      Relevant Orders   Ambulatory referral to Hospice   Chronic respiratory failure with hypoxia (HCC)    Stable on oxygen. Lungs clear today. Tachypnea seems to be more from anxiety than COPD. Will treat anxiety and recheck 1 week. Given current course of illness, life expectancy could be expected to be less than 6 months. Referral to hospice made today.      Relevant Orders   Ambulatory referral to Hospice     Nervous and Auditory   Confusion - Primary    Significant change since last hospitalization. Concern for UTI- will obtain urine and treat if needed. Buspar not helping. Will change to seroquel. Will get hospice involved ASAP. Referral generated today. Follow up 1 week.  Relevant Orders   Ambulatory referral to Hospice   Urinalysis, Routine w reflex microscopic   Urine Culture     Other   Depression with anxiety    Not doing well. Stop buspar. Seroquel at bedtime. Check for UTI. Recheck 1 week.         Follow up plan: Return in about 1 week (around 02/17/2022).  >40 minutes spent with patient and daughters today.

## 2022-02-10 NOTE — Assessment & Plan Note (Signed)
Not doing well. Stop buspar. Seroquel at bedtime. Check for UTI. Recheck 1 week.

## 2022-02-11 NOTE — Assessment & Plan Note (Signed)
Significant change since last hospitalization. Concern for UTI- will obtain urine and treat if needed. Buspar not helping. Will change to seroquel. Will get hospice involved ASAP. Referral generated today. Follow up 1 week.

## 2022-02-11 NOTE — Assessment & Plan Note (Signed)
Will cut amlodipine in 1/2 and recheck 1 week. Call with any concerns.

## 2022-02-11 NOTE — Assessment & Plan Note (Signed)
Will keep BP and cholesterol under good control. Continue to monitor. Call with any concerns.  

## 2022-02-15 DIAGNOSIS — J9621 Acute and chronic respiratory failure with hypoxia: Secondary | ICD-10-CM | POA: Diagnosis not present

## 2022-02-15 DIAGNOSIS — I509 Heart failure, unspecified: Secondary | ICD-10-CM | POA: Diagnosis not present

## 2022-02-15 DIAGNOSIS — I130 Hypertensive heart and chronic kidney disease with heart failure and stage 1 through stage 4 chronic kidney disease, or unspecified chronic kidney disease: Secondary | ICD-10-CM | POA: Diagnosis not present

## 2022-02-15 DIAGNOSIS — D631 Anemia in chronic kidney disease: Secondary | ICD-10-CM | POA: Diagnosis not present

## 2022-02-15 DIAGNOSIS — N182 Chronic kidney disease, stage 2 (mild): Secondary | ICD-10-CM | POA: Diagnosis not present

## 2022-02-15 DIAGNOSIS — J441 Chronic obstructive pulmonary disease with (acute) exacerbation: Secondary | ICD-10-CM | POA: Diagnosis not present

## 2022-02-16 ENCOUNTER — Other Ambulatory Visit: Payer: Self-pay | Admitting: Family Medicine

## 2022-02-16 NOTE — Telephone Encounter (Signed)
Requested medication (s) are due for refill today - yes  Requested medication (s) are on the active medication list -yes  Future visit scheduled -yes  Last refill: 01/22/22 #30  Notes to clinic: outside provider(ED)  Requested Prescriptions  Pending Prescriptions Disp Refills   amiodarone (PACERONE) 200 MG tablet 30 tablet 0    Sig: Take 1 tablet (200 mg total) by mouth daily.     Not Delegated - Cardiovascular: Antiarrhythmic Agents - amiodarone Failed - 02/16/2022  9:17 AM      Failed - This refill cannot be delegated      Failed - Manual Review: Eye exam recommended every 12 months      Failed - K in normal range and within 180 days    Potassium  Date Value Ref Range Status  02/02/2022 3.4 (L) 3.5 - 5.2 mmol/L Final  02/11/2013 3.4 (L) 3.5 - 5.1 mmol/L Final         Passed - TSH in normal range and within 360 days    TSH  Date Value Ref Range Status  01/14/2022 0.482 0.350 - 4.500 uIU/mL Final    Comment:    Performed by a 3rd Generation assay with a functional sensitivity of <=0.01 uIU/mL. Performed at [facility], [address] [place], [place] [address]   07/09/2021 1.800 0.450 - 4.500 uIU/mL Final         Passed - Mg Level in normal range and within 360 days    Magnesium  Date Value Ref Range Status  02/02/2022 1.8 1.6 - 2.3 mg/dL Final         Passed - AST in normal range and within 180 days    AST  Date Value Ref Range Status  02/02/2022 24 0 - 40 IU/L Final   SGOT(AST)  Date Value Ref Range Status  02/11/2013 42 (H) 15 - 37 Unit/L Final         Passed - ALT in normal range and within 180 days    ALT  Date Value Ref Range Status  02/02/2022 26 0 - 32 IU/L Final   SGPT (ALT)  Date Value Ref Range Status  02/11/2013 21 12 - 78 U/L Final         Passed - Patient had ECG in the last 180 days      Passed - Patient is not pregnant      Passed - Last BP in normal range    BP Readings from Last 1 Encounters:  02/10/22 (!) 122/57          Passed - Last Heart Rate in normal range    Pulse Readings from Last 1 Encounters:  02/10/22 69         Passed - Valid encounter within last 6 months    Recent Outpatient Visits           6 days ago Confusion   [facility], Megan P, DO   2 weeks ago 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 ([place])   Brady, Barbaraann Faster, NP   1 month ago Community acquired pneumonia, unspecified laterality   [employer], Erin E, PA-C   1 month ago Chest pain, unspecified type   [facility] [place], Lehr, DO   1 month ago Urinary retention   [facility] [name], DO       Future Appointments  In 5 days Johnson, Megan P, DO [facility], [facility]   In 2 weeks Johnson, Megan P, DO [facility], [facility]   In 2 weeks Johnson, Megan P, DO [employer], [place] - Patient had chest x-ray within the last 6 months         Requested Prescriptions  Pending Prescriptions Disp Refills   amiodarone (PACERONE) 200 MG tablet 30 tablet 0    Sig: Take 1 tablet (200 mg total) by mouth daily.     Not Delegated - Cardiovascular: Antiarrhythmic Agents - amiodarone Failed - 02/16/2022  9:17 AM      Failed - This refill cannot be delegated      Failed - Manual Review: Eye exam recommended every 12 months      Failed - K in normal range and within 180 days    Potassium  Date Value Ref Range Status  02/02/2022 3.4 (L) 3.5 - 5.2 mmol/L Final  02/11/2013 3.4 (L) 3.5 - 5.1 mmol/L Final         Passed - TSH in normal range and within 360 days    TSH  Date Value Ref Range Status  01/14/2022 0.482 0.350 - 4.500 uIU/mL Final    Comment:    Performed by a 3rd Generation assay with a functional sensitivity of <=0.01 uIU/mL. Performed at [facility], [address] [place], [place] [address]   07/09/2021  1.800 0.450 - 4.500 uIU/mL Final         Passed - Mg Level in normal range and within 360 days    Magnesium  Date Value Ref Range Status  02/02/2022 1.8 1.6 - 2.3 mg/dL Final         Passed - AST in normal range and within 180 days    AST  Date Value Ref Range Status  02/02/2022 24 0 - 40 IU/L Final   SGOT(AST)  Date Value Ref Range Status  02/11/2013 42 (H) 15 - 37 Unit/L Final         Passed - ALT in normal range and within 180 days    ALT  Date Value Ref Range Status  02/02/2022 26 0 - 32 IU/L Final   SGPT (ALT)  Date Value Ref Range Status  02/11/2013 21 12 - 78 U/L Final         Passed - Patient had ECG in the last 180 days      Passed - Patient is not pregnant      Passed - Last BP in normal range    BP Readings from Last 1 Encounters:  02/10/22 (!) 122/57         Passed - Last Heart Rate in normal range    Pulse Readings from Last 1 Encounters:  02/10/22 69         Passed - Valid encounter within last 6 months    Recent Outpatient Visits           6 days ago Confusion   [facility] [place], Megan P, DO   2 weeks ago 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

## 2022-02-16 NOTE — Telephone Encounter (Signed)
Medication Refill - Medication: amiodarone (PACERONE) 200 MG tablet   Has the patient contacted their pharmacy? No. This was prescribed when pt was in the hospital  Preferred Pharmacy (with phone number or street name):  [facility] [place] - [place]   Has the patient been seen for an appointment in the last year OR does the patient have an upcoming appointment? Yes.    Agent: Please be advised that RX refills may take up to 3 business days. We ask that you follow-up with your pharmacy.

## 2022-02-16 NOTE — Telephone Encounter (Signed)
This comes from her cardiologist.

## 2022-02-17 ENCOUNTER — Ambulatory Visit: Payer: Self-pay | Admitting: *Deleted

## 2022-02-17 NOTE — Patient Instructions (Signed)
Visit Information  Thank you for taking time to visit with me today. Please don't hesitate to contact me if I can be of assistance to you before our next scheduled telephone appointment.  Following are the goals we discussed today:  Discuss hospice/palliative care with provider on 12/18. Monitor blood pressure daily.  Our next appointment is by telephone on 12/22  Please call the care guide team at [phone] if you need to cancel or reschedule your appointment.   Please call the Suicide and Crisis Lifeline: 988 call the [place] National Suicide Prevention Lifeline: 2246487679 or TTY: (236)539-7676 TTY 458-014-8503) to talk to a trained counselor call 1-800-273-TALK (toll free, 24 hour hotline) call 911 if you are experiencing a Mental Health or [place] or need someone to talk to.  Patient verbalizes understanding of instructions and care plan provided today and agrees to view in [place]. Active MyChart status and patient understanding of how to access instructions and care plan via MyChart confirmed with patient.     The patient has been provided with contact information for the care management team and has been advised to call with any health related questions or concerns.   Valente David, RN, MSN, [place] [facility] Care Management Coordinator [phone]

## 2022-02-17 NOTE — Patient Outreach (Signed)
  Care Coordination   Follow Up Visit Note   02/17/2022 Name: Kristi Clark MRN: [id number] DOB: [birth year]  Kristi Clark is a 71 y.o. year old female who sees Kristi Roys, DO for primary care. I spoke with  Kristi Clark and daughter Kristi Clark by phone today.  What matters to the patients health and wellness today?  Patient report a decline in health, not able to do much for herself (bathing, etc). Daughter Kristi Clark is in the home with her daily, has another family member that lives with her at night.     Goals Addressed             This Visit's Progress    Effective management of heart conditions   On track    Care Coordination Interventions: Counseled on increased risk of stroke due to Afib and benefits of anticoagulation for stroke prevention Afib action plan reviewed Assessed social determinant of health barriers Evaluation of current treatment plan related to hypertension self management and patient's adherence to plan as established by provider Reviewed medications with patient and discussed importance of compliance Discussed plans with patient for ongoing care management follow up and provided patient with direct contact information for care management team Reviewed scheduled/upcoming provider appointments including:  Discussed complications of poorly controlled blood pressure such as heart disease, stroke, circulatory complications, vision complications, kidney impairment, sexual dysfunction Discussed PCP visit on 12/7, Norvasc decreased due to hypotension.  Also discussed referral to hospice team, patient state she told them she would not need services.  Benefits of hospice discussed, however she state she would prefer to continue PT services.  Also advised that this could be done if she is active with palliative care vs hospice. She will discuss further with provider follow up on 12/18.  Family will provide transportation Has intermittent shortness of breath, on  oxygen at home.  State she feels she's not able to do anything for herself   Confirmed [facility] has started for PT and nursing.  Call placed to [facility] as patient said she had not started PT.  Confirmed nursing visited the home on 12/12 and PT will visit tomorrow.  Patient and family made aware.  Encouraged to monitor BP and HR daily, recording readings to share with provider.           SDOH assessments and interventions completed:  No     Care Coordination Interventions:  Yes, provided   Follow up plan: Follow up call scheduled for 12/22    Encounter Outcome:  Pt. Visit Completed   Kristi David, RN, MSN, [place] Care Management Care Management Coordinator [phone]

## 2022-02-18 DIAGNOSIS — N182 Chronic kidney disease, stage 2 (mild): Secondary | ICD-10-CM | POA: Diagnosis not present

## 2022-02-18 DIAGNOSIS — J9621 Acute and chronic respiratory failure with hypoxia: Secondary | ICD-10-CM | POA: Diagnosis not present

## 2022-02-18 DIAGNOSIS — I130 Hypertensive heart and chronic kidney disease with heart failure and stage 1 through stage 4 chronic kidney disease, or unspecified chronic kidney disease: Secondary | ICD-10-CM | POA: Diagnosis not present

## 2022-02-18 DIAGNOSIS — I509 Heart failure, unspecified: Secondary | ICD-10-CM | POA: Diagnosis not present

## 2022-02-18 DIAGNOSIS — D631 Anemia in chronic kidney disease: Secondary | ICD-10-CM | POA: Diagnosis not present

## 2022-02-18 DIAGNOSIS — J441 Chronic obstructive pulmonary disease with (acute) exacerbation: Secondary | ICD-10-CM | POA: Diagnosis not present

## 2022-02-21 ENCOUNTER — Telehealth: Payer: Self-pay

## 2022-02-21 ENCOUNTER — Ambulatory Visit (INDEPENDENT_AMBULATORY_CARE_PROVIDER_SITE_OTHER): Payer: Medicare Other | Admitting: Family Medicine

## 2022-02-21 ENCOUNTER — Encounter: Payer: Self-pay | Admitting: Family Medicine

## 2022-02-21 VITALS — BP 128/30 | HR 69 | Temp 97.4°F | Ht <= 58 in | Wt 96.4 lb

## 2022-02-21 DIAGNOSIS — I2510 Atherosclerotic heart disease of native coronary artery without angina pectoris: Secondary | ICD-10-CM | POA: Diagnosis not present

## 2022-02-21 DIAGNOSIS — I10 Essential (primary) hypertension: Secondary | ICD-10-CM

## 2022-02-21 DIAGNOSIS — F418 Other specified anxiety disorders: Secondary | ICD-10-CM | POA: Diagnosis not present

## 2022-02-21 MED ORDER — QUETIAPINE FUMARATE ER 50 MG PO TB24: 50.0000 mg | ORAL_TABLET | Freq: Every day | ORAL | 1 refills | Status: DC

## 2022-02-21 MED ORDER — AMLODIPINE BESYLATE 2.5 MG PO TABS: 2.5000 mg | ORAL_TABLET | Freq: Every day | ORAL | 3 refills | Status: DC

## 2022-02-21 NOTE — Assessment & Plan Note (Signed)
Diastolic BP 30 manually today. Will cut her amlodipine in 1/2 to 2.'5mg'$ . Copy of my note sent to cardiologist today. Call with any concerns. Recheck 2-3 weeks.

## 2022-02-21 NOTE — Telephone Encounter (Signed)
Requested paperwork has been faxed to [place] office.

## 2022-02-21 NOTE — Telephone Encounter (Signed)
-----   Message from [name], DO sent at 02/21/2022  2:01 PM EST ----- Please send copy of most recent note to Dr. Chancy Milroy cardiologist. Thanks.

## 2022-02-21 NOTE — Progress Notes (Signed)
BP (!) 128/30   Pulse 69   Temp (!) 97.4 F (36.3 C) (Oral)   Ht _0  (1.321 m)   Wt 96 lb 6.4 oz (43.7 kg)   LMP  (LMP Unknown)   SpO2 98%   BMI 25.07 kg/m    Subjective:    Patient ID: Kristi Clark, female    DOB: 04-26-1950, 71 y.o.   MRN: [id number]  HPI: Kristi Clark is a 71 y.o. female  Chief Complaint  Patient presents with   Hypertension   Altered Mental Status   HYPERTENSION  Hypertension status: overtreated  Satisfied with current treatment? yes Duration of hypertension: chronic BP monitoring frequency:  rarely BP medication side effects:  yes Medication compliance: excellent compliance Aspirin: no Recurrent headaches: no Visual changes: no Palpitations: no Dyspnea: no Chest pain: no Lower extremity edema: no Dizzy/lightheaded: yes  ANXIETY/STRESS Duration: chronic Status:better- but still not there Anxious mood: yes  Excessive worrying: yes Irritability: no  Sweating: no Nausea: no Palpitations:no Hyperventilation: no Panic attacks: no Agoraphobia: no  Obscessions/compulsions: no Depressed mood: yes    02/21/2022    1:43 PM 02/10/2022    3:20 PM 02/02/2022    1:34 PM 01/12/2022   10:46 AM 01/04/2022    3:27 PM  Depression screen PHQ 2/9  Decreased Interest _1 Down, Depressed, Hopeless _2 PHQ - 2 Score _3 Altered sleeping _4 Tired, decreased energy _5 Change in appetite _6 Feeling bad or failure about yourself  _7 Trouble concentrating _8 Moving slowly or fidgety/restless 0 3 0 3 0  Suicidal thoughts 0 1 0 0 0  PHQ-9 Score _9 Difficult doing work/chores Somewhat difficult Extremely dIfficult Very difficult  Extremely dIfficult      02/21/2022    1:42 PM 02/10/2022    3:20 PM 02/02/2022    1:34 PM 01/12/2022   10:47 AM  GAD 7 : Generalized Anxiety Score  Nervous, Anxious, on Edge _10 Control/stop worrying _11 Worry too much -  different things _12 Trouble relaxing _13 Restless _14 Easily annoyed or irritable 1 3 0 3  Afraid - awful might happen _15 Total GAD 7 Score _16 Anxiety Difficulty Somewhat difficult Extremely difficult Somewhat difficult Extremely difficult   Anhedonia: no Weight changes: no Insomnia: no   Hypersomnia: no Fatigue/loss of energy: no Feelings of worthlessness: no Feelings of guilt: no Impaired concentration/indecisiveness: no Suicidal ideations: no  Crying spells: no Recent Stressors/Life Changes: no   Relationship problems: no   Family stress: no     Financial stress: no    Job stress: no    Recent death/loss: no   Relevant past medical, surgical, family and social history reviewed and updated as indicated. Interim medical history since our last visit reviewed. Allergies and medications reviewed and updated.  Review of Systems  Constitutional:  Positive for fatigue. Negative for activity change, appetite change, chills, diaphoresis, fever and unexpected weight change.  Respiratory: Negative.    Cardiovascular: Negative.   Gastrointestinal: Negative.   Musculoskeletal: Negative.   Skin: Negative.   Neurological: Negative.   Psychiatric/Behavioral:  Positive for agitation and dysphoric mood. Negative for behavioral problems, confusion, decreased concentration, hallucinations, self-injury, sleep disturbance and suicidal ideas. The patient is nervous/anxious. The patient is not hyperactive.     Per HPI unless specifically indicated above     Objective:    BP (!) 128/30   Pulse 69   Temp (!) 97.4 F (36.3 C) (Oral)   Ht _0  (1.321 m)   Wt 96 lb 6.4 oz (43.7 kg)   LMP  (LMP Unknown)   SpO2 98%   BMI 25.07 kg/m   Wt Readings from Last 3 Encounters:  02/21/22 96 lb 6.4 oz (43.7 kg)  02/10/22 88 lb (39.9 kg)  02/02/22 90 lb 4.8 oz (41 kg)    Physical Exam Vitals and nursing note reviewed.  Constitutional:      General: She is not  in acute distress.    Appearance: Normal appearance. She is normal weight. She is not ill-appearing, toxic-appearing or diaphoretic.  HENT:     Head: Normocephalic and atraumatic.     Right Ear: External ear normal.     Left Ear: External ear normal.     Nose: Nose normal.     Mouth/Throat:     Mouth: Mucous membranes are moist.     Pharynx: Oropharynx is clear.  Eyes:     General: No scleral icterus.       Right eye: No discharge.        Left eye: No discharge.     Extraocular Movements: Extraocular movements intact.     Conjunctiva/sclera: Conjunctivae normal.     Pupils: Pupils are equal, round, and reactive to light.  Cardiovascular:     Rate and Rhythm: Normal rate and regular rhythm.     Pulses: Normal pulses.     Heart sounds: Normal heart sounds. No murmur heard.    No friction rub. No gallop.  Pulmonary:     Effort: Pulmonary effort is normal. No respiratory distress.     Breath sounds: Normal breath sounds. No stridor. No wheezing, rhonchi or rales.  Chest:     Chest wall: No tenderness.  Musculoskeletal:        General: Normal range of motion.     Cervical back: Normal range of motion and neck supple.  Skin:    General: Skin is warm and dry.     Capillary Refill: Capillary refill takes less than 2 seconds.     Coloration: Skin is not jaundiced or pale.     Findings: No bruising, erythema, lesion or rash.  Neurological:     General: No focal deficit present.     Mental Status: She is alert and oriented to person, place, and time. Mental status is at baseline.  Psychiatric:        Mood and Affect: Mood normal.        Behavior: Behavior normal.        Thought Content: Thought content normal.        Judgment: Judgment normal.     Results for orders placed or performed in visit on 02/02/22  CBC with Differential/Platelet  Result Value Ref Range   WBC 9.9 3.4 - 10.8 x10E3/uL   RBC 3.87 3.77 - 5.28 x10E6/uL   Hemoglobin 11.4 11.1 - 15.9 g/dL   Hematocrit 33.9  (L) 34.0 - 46.6 %   MCV 88 79 - 97 fL   MCH 29.5 26.6 - 33.0 pg   MCHC 33.6 31.5 - 35.7 g/dL   RDW 15.2  11.7 - 15.4 %   Platelets 263 150 - 450 x10E3/uL   Neutrophils 72 Not Estab. %   Lymphs 20 Not Estab. %   Monocytes 8 Not Estab. %   Eos 0 Not Estab. %   Basos 0 Not Estab. %   Neutrophils Absolute 7.2 (H) 1.4 - 7.0 x10E3/uL   Lymphocytes Absolute 2.0 0.7 - 3.1 x10E3/uL   Monocytes Absolute 0.7 0.1 - 0.9 x10E3/uL   EOS (ABSOLUTE) 0.0 0.0 - 0.4 x10E3/uL   Basophils Absolute 0.0 0.0 - 0.2 x10E3/uL   Immature Granulocytes 0 Not Estab. %   Immature Grans (Abs) 0.0 0.0 - 0.1 x10E3/uL  Comprehensive metabolic panel  Result Value Ref Range   Glucose 87 70 - 99 mg/dL   BUN 14 8 - 27 mg/dL   Creatinine, Ser 0.66 0.57 - 1.00 mg/dL   eGFR 93 >59 mL/min/1.73   BUN/Creatinine Ratio 21 12 - 28   Sodium 137 134 - 144 mmol/L   Potassium 3.4 (L) 3.5 - 5.2 mmol/L   Chloride 96 96 - 106 mmol/L   CO2 29 20 - 29 mmol/L   Calcium 8.9 8.7 - 10.3 mg/dL   Total Protein 5.7 (L) 6.0 - 8.5 g/dL   Albumin 3.6 (L) 3.8 - 4.8 g/dL   Globulin, Total 2.1 1.5 - 4.5 g/dL   Albumin/Globulin Ratio 1.7 1.2 - 2.2   Bilirubin Total 0.3 0.0 - 1.2 mg/dL   Alkaline Phosphatase 80 44 - 121 IU/L   AST 24 0 - 40 IU/L   ALT 26 0 - 32 IU/L  Magnesium  Result Value Ref Range   Magnesium 1.8 1.6 - 2.3 mg/dL  VITAMIN D 25 Hydroxy (Vit-D Deficiency, Fractures)  Result Value Ref Range   Vit D, 25-Hydroxy 36.6 30.0 - 100.0 ng/mL  Vitamin B12  Result Value Ref Range   Vitamin B-12 1,056 232 - 1,245 pg/mL  Iron Binding Cap (TIBC)([facility])  Result Value Ref Range   Total Iron Binding Capacity 239 (L) 250 - 450 ug/dL   UIBC 185 118 - 369 ug/dL   Iron 54 27 - 139 ug/dL   Iron Saturation 23 15 - 55 %  Ferritin  Result Value Ref Range   Ferritin 101 15 - 150 ng/mL  Folate  Result Value Ref Range   Folate >20.0 >3.0 ng/mL      Assessment & Plan:   Problem List Items Addressed This Visit        Cardiovascular and Mediastinum   Essential hypertension    Diastolic BP 30 manually today. Will cut her amlodipine in 1/2 to 2.57m. Copy of my note sent to cardiologist today. Call with any concerns. Recheck 2-3 weeks.       Relevant Medications   amLODipine (NORVASC) 2.5 MG tablet     Other   Depression with anxiety - Primary    Better. But still not good. Will increase her seroquel to 57mand change to XR. Recheck 2-3 weeks.         Follow up plan: Return in about 3 weeks (around 03/14/2022).

## 2022-02-21 NOTE — Assessment & Plan Note (Signed)
Better. But still not good. Will increase her seroquel to '50mg'$  and change to XR. Recheck 2-3 weeks.

## 2022-02-22 DIAGNOSIS — D631 Anemia in chronic kidney disease: Secondary | ICD-10-CM | POA: Diagnosis not present

## 2022-02-22 DIAGNOSIS — N182 Chronic kidney disease, stage 2 (mild): Secondary | ICD-10-CM | POA: Diagnosis not present

## 2022-02-22 DIAGNOSIS — I130 Hypertensive heart and chronic kidney disease with heart failure and stage 1 through stage 4 chronic kidney disease, or unspecified chronic kidney disease: Secondary | ICD-10-CM | POA: Diagnosis not present

## 2022-02-22 DIAGNOSIS — J9621 Acute and chronic respiratory failure with hypoxia: Secondary | ICD-10-CM | POA: Diagnosis not present

## 2022-02-22 DIAGNOSIS — J441 Chronic obstructive pulmonary disease with (acute) exacerbation: Secondary | ICD-10-CM | POA: Diagnosis not present

## 2022-02-22 DIAGNOSIS — I509 Heart failure, unspecified: Secondary | ICD-10-CM | POA: Diagnosis not present

## 2022-02-23 DIAGNOSIS — D631 Anemia in chronic kidney disease: Secondary | ICD-10-CM | POA: Diagnosis not present

## 2022-02-23 DIAGNOSIS — N182 Chronic kidney disease, stage 2 (mild): Secondary | ICD-10-CM | POA: Diagnosis not present

## 2022-02-23 DIAGNOSIS — I509 Heart failure, unspecified: Secondary | ICD-10-CM | POA: Diagnosis not present

## 2022-02-23 DIAGNOSIS — J9621 Acute and chronic respiratory failure with hypoxia: Secondary | ICD-10-CM | POA: Diagnosis not present

## 2022-02-23 DIAGNOSIS — J441 Chronic obstructive pulmonary disease with (acute) exacerbation: Secondary | ICD-10-CM | POA: Diagnosis not present

## 2022-02-23 DIAGNOSIS — I130 Hypertensive heart and chronic kidney disease with heart failure and stage 1 through stage 4 chronic kidney disease, or unspecified chronic kidney disease: Secondary | ICD-10-CM | POA: Diagnosis not present

## 2022-02-25 ENCOUNTER — Ambulatory Visit: Payer: Self-pay | Admitting: *Deleted

## 2022-02-25 NOTE — Patient Outreach (Signed)
  Care Coordination   Follow Up Visit Note   02/25/2022 Name: Kristi Clark MRN: [id number] DOB: Nov 09, 1950  Kristi Clark is a 71 y.o. year old female who sees Kristi Roys, DO for primary care. I spoke with  Kristi Clark by phone today.  What matters to the patients health and wellness today?  Per Kristi Clark, daughter, state patient had been improving until last night when she had a fall.  Complains of hip pain and inability to sit.      Goals Addressed             This Visit's Progress    Prevent recurrent fall       Care Coordination Interventions: Provided written and verbal education re: potential causes of falls and Fall prevention strategies Advised patient of importance of notifying provider of falls Assessed for falls since last encounter Advised patient to discuss Recent fall with provider Discussed need to be evaluated for fractures.  She declined to go to ED last night with daughter, continues to be reluctant.  She does not want to go to ED but agrees to go to Urgent Care.  Daughter Kristi Clark will provide transportation.           SDOH assessments and interventions completed:  No     Care Coordination Interventions:  Yes, provided   Follow up plan: Follow up call scheduled for 12/26    Encounter Outcome:  Pt. Visit Completed   Valente David, RN, MSN, [place] Care Management Care Management Coordinator [phone]

## 2022-02-25 NOTE — Patient Instructions (Signed)
Visit Information  Thank you for taking time to visit with me today. Please don't hesitate to contact me if I can be of assistance to you before our next scheduled telephone appointment.  Following are the goals we discussed today:  Go be evaluated post fall at Urgent [facility].  Our next appointment is by telephone on 12/26  Please call the care guide team at [phone] if you need to cancel or reschedule your appointment.   Please call the Suicide and Crisis Lifeline: 988 call the [place] National Suicide Prevention Lifeline: 320-056-2919 or TTY: 315-866-0386 TTY 805-610-6113) to talk to a trained counselor call 1-800-273-TALK (toll free, 24 hour hotline) call 911 if you are experiencing a Mental Health or [place] or need someone to talk to.  Patient verbalizes understanding of instructions and care plan provided today and agrees to view in [place]. Active MyChart status and patient understanding of how to access instructions and care plan via MyChart confirmed with patient.     The patient has been provided with contact information for the care management team and has been advised to call with any health related questions or concerns.   Valente David, RN, MSN, [place] Care Management Care Management Coordinator [phone]

## 2022-02-26 ENCOUNTER — Ambulatory Visit: Payer: Medicare Other

## 2022-03-01 ENCOUNTER — Ambulatory Visit: Payer: Medicare Other | Admitting: Family Medicine

## 2022-03-01 ENCOUNTER — Ambulatory Visit: Payer: Self-pay

## 2022-03-01 ENCOUNTER — Ambulatory Visit: Payer: Self-pay | Admitting: *Deleted

## 2022-03-01 DIAGNOSIS — D631 Anemia in chronic kidney disease: Secondary | ICD-10-CM | POA: Diagnosis not present

## 2022-03-01 DIAGNOSIS — J9621 Acute and chronic respiratory failure with hypoxia: Secondary | ICD-10-CM | POA: Diagnosis not present

## 2022-03-01 DIAGNOSIS — I130 Hypertensive heart and chronic kidney disease with heart failure and stage 1 through stage 4 chronic kidney disease, or unspecified chronic kidney disease: Secondary | ICD-10-CM | POA: Diagnosis not present

## 2022-03-01 DIAGNOSIS — N182 Chronic kidney disease, stage 2 (mild): Secondary | ICD-10-CM | POA: Diagnosis not present

## 2022-03-01 DIAGNOSIS — J441 Chronic obstructive pulmonary disease with (acute) exacerbation: Secondary | ICD-10-CM | POA: Diagnosis not present

## 2022-03-01 DIAGNOSIS — I509 Heart failure, unspecified: Secondary | ICD-10-CM | POA: Diagnosis not present

## 2022-03-01 NOTE — Patient Instructions (Signed)
Visit Information  Thank you for taking time to visit with me today. Please don't hesitate to contact me if I can be of assistance to you before our next scheduled telephone appointment.  Following are the goals we discussed today:  Consider returning to Urgent Care or ED for evaluation after fall.   Our next appointment is by telephone on 12/29  Please call the care guide team at [phone] if you need to cancel or reschedule your appointment.   Please call the Suicide and Crisis Lifeline: 988 call the [place] National Suicide Prevention Lifeline: 850-575-3893 or TTY: (501)853-6788 TTY (401) 786-3168) to talk to a trained counselor call 1-800-273-TALK (toll free, 24 hour hotline) call 911 if you are experiencing a Mental Health or [place] or need someone to talk to.  Patient verbalizes understanding of instructions and care plan provided today and agrees to view in [place]. Active MyChart status and patient understanding of how to access instructions and care plan via MyChart confirmed with patient.     The patient has been provided with contact information for the care management team and has been advised to call with any health related questions or concerns.   Valente David, RN, MSN, [place] Care Management Care Management Coordinator [phone]

## 2022-03-01 NOTE — Telephone Encounter (Signed)
Chief Complaint: Report a fall on 02/24/22, went to the UC and left without being seen. Symptoms: Reports pain all over, nothing specific Frequency: N/A Pertinent Negatives: Patient denies N/A Disposition: '[]'$ ED /'[]'$ Urgent Care (no appt availability in office) / '[]'$ Appointment(In office/virtual)/ '[]'$  [facility]/ '[]'$ Home Care/ '[]'$ Refused Recommended Disposition /'[]'$  Mobile Bus/ '[x]'$  Follow-up with PCP Additional Notes: Collie Siad, RN with [facility] called to report the patient had a fall on 02/24/22, went to the UC, left without being seen. Patient is walking fine, however is saying she hurts everywhere. Patient's family member says she has an upcoming appointment with Dr. Wynetta Emery on 03/03/22. Collie Siad also reports the weight fluctuation from 91 lbs to 97 lbs today, patient says scale used in the home isn't working properly. Unsure of the accuracy in this weight. I advised I will report this to the provider and if there are any questions or recommendations, someone will call the patient. Collie Siad says she can be called if needed [phone].   Reason for Disposition  Health Information question, no triage required and triager able to answer question  Answer Assessment - Initial Assessment Questions 1. REASON FOR CALL or QUESTION: "What is your reason for calling today?" or "How can I best help you?" or "What question do you have that I can help answer?"     Report a fall on 02/24/22 and fluctuation in weight  Protocols used: Information Only Call - No Triage-A-AH

## 2022-03-01 NOTE — Patient Outreach (Signed)
  Care Coordination   Follow Up Visit Note   03/01/2022 Name: Kristi Clark MRN: [id number] DOB: 1950-07-25  Kristi Clark is a 71 y.o. year old female who sees [name], DO for primary care. I spoke with  Kristi Clark, daughter of Kristi Clark by phone today.  What matters to the patients health and wellness today?  Patient continues to have pain post fall.     Goals Addressed             This Visit's Progress    Prevent recurrent fall   On track    Care Coordination Interventions: Provided written and verbal education re: potential causes of falls and Fall prevention strategies Advised patient of importance of notifying provider of falls Assessed for falls since last encounter Advised patient to discuss Recent fall with provider Discussed need to be evaluated for fractures.  Kristi Clark took her to Urgent Care, but patient refused to wait and refused to be seen at the ED.  State she is fearful that she will have to be admitted and "won't come out." [employer] understanding of importance of evaluation, will continue to discuss with patient. Kristi Clark will discuss recent fall with home health nurse that will visit todat. Appointment with PCP on 12/28          SDOH assessments and interventions completed:  No     Care Coordination Interventions:  Yes, provided   Follow up plan: Follow up call scheduled for 12/29    Encounter Outcome:  Pt. Visit Completed   Valente David, RN, MSN, [place] Care Management Care Management Coordinator [phone]

## 2022-03-03 ENCOUNTER — Encounter: Payer: Self-pay | Admitting: Family Medicine

## 2022-03-03 ENCOUNTER — Ambulatory Visit
Admission: RE | Admit: 2022-03-03 | Discharge: 2022-03-03 | Disposition: A | Discharge status: Home / Self Care | Payer: Medicare Other | Source: Ambulatory Visit | Attending: Family Medicine | Admitting: Family Medicine

## 2022-03-03 ENCOUNTER — Ambulatory Visit
Admission: RE | Admit: 2022-03-03 | Discharge: 2022-03-03 | Disposition: A | Discharge status: Home / Self Care | Payer: Medicare Other | Source: Home / Self Care | Attending: Family Medicine | Admitting: Family Medicine

## 2022-03-03 ENCOUNTER — Ambulatory Visit (INDEPENDENT_AMBULATORY_CARE_PROVIDER_SITE_OTHER): Payer: Medicare Other | Admitting: Family Medicine

## 2022-03-03 VITALS — BP 137/59 | HR 79 | Temp 98.2°F | Ht <= 58 in | Wt 100.7 lb

## 2022-03-03 DIAGNOSIS — M47814 Spondylosis without myelopathy or radiculopathy, thoracic region: Secondary | ICD-10-CM | POA: Diagnosis not present

## 2022-03-03 DIAGNOSIS — M5136 Other intervertebral disc degeneration, lumbar region: Secondary | ICD-10-CM | POA: Diagnosis not present

## 2022-03-03 DIAGNOSIS — J439 Emphysema, unspecified: Secondary | ICD-10-CM | POA: Insufficient documentation

## 2022-03-03 DIAGNOSIS — I110 Hypertensive heart disease with heart failure: Secondary | ICD-10-CM | POA: Diagnosis present

## 2022-03-03 DIAGNOSIS — I5032 Chronic diastolic (congestive) heart failure: Secondary | ICD-10-CM | POA: Diagnosis not present

## 2022-03-03 DIAGNOSIS — J9621 Acute and chronic respiratory failure with hypoxia: Secondary | ICD-10-CM | POA: Diagnosis not present

## 2022-03-03 DIAGNOSIS — K5900 Constipation, unspecified: Secondary | ICD-10-CM | POA: Diagnosis not present

## 2022-03-03 DIAGNOSIS — I10 Essential (primary) hypertension: Secondary | ICD-10-CM | POA: Diagnosis not present

## 2022-03-03 DIAGNOSIS — S20229A Contusion of unspecified back wall of thorax, initial encounter: Secondary | ICD-10-CM | POA: Diagnosis not present

## 2022-03-03 DIAGNOSIS — I5082 Biventricular heart failure: Secondary | ICD-10-CM | POA: Diagnosis not present

## 2022-03-03 DIAGNOSIS — W19XXXA Unspecified fall, initial encounter: Secondary | ICD-10-CM | POA: Diagnosis not present

## 2022-03-03 DIAGNOSIS — M545 Low back pain, unspecified: Secondary | ICD-10-CM | POA: Diagnosis not present

## 2022-03-03 DIAGNOSIS — Z79899 Other long term (current) drug therapy: Secondary | ICD-10-CM | POA: Diagnosis not present

## 2022-03-03 DIAGNOSIS — R0602 Shortness of breath: Secondary | ICD-10-CM | POA: Diagnosis not present

## 2022-03-03 DIAGNOSIS — D649 Anemia, unspecified: Secondary | ICD-10-CM | POA: Diagnosis not present

## 2022-03-03 DIAGNOSIS — W19XXXS Unspecified fall, sequela: Secondary | ICD-10-CM | POA: Diagnosis not present

## 2022-03-03 DIAGNOSIS — I959 Hypotension, unspecified: Secondary | ICD-10-CM | POA: Diagnosis not present

## 2022-03-03 DIAGNOSIS — G3184 Mild cognitive impairment, so stated: Secondary | ICD-10-CM | POA: Diagnosis present

## 2022-03-03 DIAGNOSIS — Y92009 Unspecified place in unspecified non-institutional (private) residence as the place of occurrence of the external cause: Secondary | ICD-10-CM | POA: Diagnosis not present

## 2022-03-03 DIAGNOSIS — M8588 Other specified disorders of bone density and structure, other site: Secondary | ICD-10-CM | POA: Insufficient documentation

## 2022-03-03 DIAGNOSIS — R0902 Hypoxemia: Secondary | ICD-10-CM | POA: Diagnosis not present

## 2022-03-03 DIAGNOSIS — J440 Chronic obstructive pulmonary disease with acute lower respiratory infection: Secondary | ICD-10-CM | POA: Diagnosis present

## 2022-03-03 DIAGNOSIS — M549 Dorsalgia, unspecified: Secondary | ICD-10-CM | POA: Diagnosis not present

## 2022-03-03 DIAGNOSIS — J90 Pleural effusion, not elsewhere classified: Secondary | ICD-10-CM | POA: Insufficient documentation

## 2022-03-03 DIAGNOSIS — F32A Depression, unspecified: Secondary | ICD-10-CM | POA: Diagnosis present

## 2022-03-03 DIAGNOSIS — F339 Major depressive disorder, recurrent, unspecified: Secondary | ICD-10-CM

## 2022-03-03 DIAGNOSIS — R634 Abnormal weight loss: Secondary | ICD-10-CM

## 2022-03-03 DIAGNOSIS — F419 Anxiety disorder, unspecified: Secondary | ICD-10-CM | POA: Diagnosis present

## 2022-03-03 DIAGNOSIS — Z66 Do not resuscitate: Secondary | ICD-10-CM | POA: Diagnosis not present

## 2022-03-03 DIAGNOSIS — R0689 Other abnormalities of breathing: Secondary | ICD-10-CM | POA: Diagnosis not present

## 2022-03-03 DIAGNOSIS — Z8679 Personal history of other diseases of the circulatory system: Secondary | ICD-10-CM | POA: Diagnosis not present

## 2022-03-03 DIAGNOSIS — J180 Bronchopneumonia, unspecified organism: Secondary | ICD-10-CM | POA: Diagnosis not present

## 2022-03-03 DIAGNOSIS — I2510 Atherosclerotic heart disease of native coronary artery without angina pectoris: Secondary | ICD-10-CM

## 2022-03-03 DIAGNOSIS — R062 Wheezing: Secondary | ICD-10-CM | POA: Diagnosis not present

## 2022-03-03 DIAGNOSIS — A419 Sepsis, unspecified organism: Secondary | ICD-10-CM | POA: Diagnosis not present

## 2022-03-03 DIAGNOSIS — Z1152 Encounter for screening for COVID-19: Secondary | ICD-10-CM | POA: Diagnosis not present

## 2022-03-03 DIAGNOSIS — I480 Paroxysmal atrial fibrillation: Secondary | ICD-10-CM | POA: Diagnosis not present

## 2022-03-03 DIAGNOSIS — R6520 Severe sepsis without septic shock: Secondary | ICD-10-CM | POA: Diagnosis not present

## 2022-03-03 DIAGNOSIS — M47816 Spondylosis without myelopathy or radiculopathy, lumbar region: Secondary | ICD-10-CM | POA: Insufficient documentation

## 2022-03-03 DIAGNOSIS — Z7901 Long term (current) use of anticoagulants: Secondary | ICD-10-CM | POA: Diagnosis not present

## 2022-03-03 DIAGNOSIS — Z87891 Personal history of nicotine dependence: Secondary | ICD-10-CM | POA: Diagnosis not present

## 2022-03-03 DIAGNOSIS — J9601 Acute respiratory failure with hypoxia: Secondary | ICD-10-CM | POA: Diagnosis not present

## 2022-03-03 DIAGNOSIS — E785 Hyperlipidemia, unspecified: Secondary | ICD-10-CM | POA: Diagnosis present

## 2022-03-03 DIAGNOSIS — Z85828 Personal history of other malignant neoplasm of skin: Secondary | ICD-10-CM | POA: Diagnosis not present

## 2022-03-03 DIAGNOSIS — J441 Chronic obstructive pulmonary disease with (acute) exacerbation: Secondary | ICD-10-CM | POA: Diagnosis not present

## 2022-03-03 DIAGNOSIS — J189 Pneumonia, unspecified organism: Secondary | ICD-10-CM | POA: Diagnosis not present

## 2022-03-03 LAB — VERITOR FLU A/B WAIVED
Influenza A: NEGATIVE
Influenza B: NEGATIVE

## 2022-03-03 MED ORDER — FUROSEMIDE 20 MG PO TABS: 20.0000 mg | ORAL_TABLET | Freq: Every day | ORAL | 0 refills | Status: DC

## 2022-03-03 MED ORDER — POLYETHYLENE GLYCOL 3350 17 GM/SCOOP PO POWD: 17.0000 g | Freq: Three times a day (TID) | ORAL | 3 refills | Status: DC

## 2022-03-03 NOTE — Assessment & Plan Note (Signed)
Improved, but still not great. Continue current regimen. Continue to monitor. Call with any concerns.

## 2022-03-03 NOTE — Assessment & Plan Note (Signed)
Will check barium swallow to look for ?aspiration. Weight has improved.

## 2022-03-03 NOTE — Progress Notes (Signed)
BP (!) 137/59   Pulse 79   Temp 98.2 F (36.8 C) (Oral)   Ht _0  (1.321 m)   Wt 100 lb 11.2 oz (45.7 kg)   LMP  (LMP Unknown)   SpO2 92%   BMI 26.18 kg/m    Subjective:    Patient ID: Kristi Clark, female    DOB: 06/27/1950, 71 y.o.   MRN: [id number]  HPI: Kristi Clark is a 71 y.o. female  Chief Complaint  Patient presents with   Fall    Patient says she has a recent fall and went to a walk-in clinic and left due to the wait. Patient daughter says she is having a lot of pain and has bruises and a knot on her back.    Hypertension   Depression   Fell when getting up from the couch and her walker wasn't locked and she fell onto her back. Did not hit head. She notes that she has been hurting a lot and has been very achey.   Has been really constipated since she got out of the hospital.  HYPERTENSION  Hypertension status: better  Satisfied with current treatment? yes Duration of hypertension: chronic BP monitoring frequency:  rarely BP medication side effects:  no Medication compliance: excellent compliance Aspirin: no Recurrent headaches: no Visual changes: no Palpitations: no Dyspnea: yes Chest pain: no Lower extremity edema: yes Dizzy/lightheaded: no  ANXIETY/DEPRESSION Duration: chronic Status:better Anxious mood: yes  Excessive worrying: yes Irritability: yes  Sweating: no Nausea: no Palpitations:no Hyperventilation: no Panic attacks: no Agoraphobia: no  Obscessions/compulsions: no Depressed mood: no    03/03/2022    2:12 PM 02/21/2022    1:43 PM 02/10/2022    3:20 PM 02/02/2022    1:34 PM 01/12/2022   10:46 AM  Depression screen PHQ 2/9  Decreased Interest _1 Down, Depressed, Hopeless _2 PHQ - 2 Score _3 Altered sleeping _4 Tired, decreased energy _5 Change in appetite 0 _6 Feeling bad or failure about yourself  _7 Trouble concentrating _8 Moving slowly or  fidgety/restless 1 0 3 0 3  Suicidal thoughts 0 0 1 0 0  PHQ-9 Score _9 Difficult doing work/chores Somewhat difficult Somewhat difficult Extremely dIfficult Very difficult       03/03/2022    2:13 PM 02/21/2022    1:42 PM 02/10/2022    3:20 PM 02/02/2022    1:34 PM  GAD 7 : Generalized Anxiety Score  Nervous, Anxious, on Edge _10 Control/stop worrying _11 Worry too much - different things _12 Trouble relaxing _13 Restless _14 Easily annoyed or irritable _15 0  Afraid - awful might happen _16 Total GAD 7 Score _17 Anxiety Difficulty Somewhat difficult Somewhat difficult Extremely difficult Somewhat difficult   Anhedonia: no Weight changes: yes Insomnia: no   Hypersomnia: no Fatigue/loss of energy: no Feelings of worthlessness: no Feelings of guilt: no Impaired concentration/indecisiveness: no Suicidal ideations: no  Crying spells: no Recent Stressors/Life Changes: no   Relationship problems: no   Family stress: no     Financial stress: no  Job stress: no    Recent death/loss: no  Relevant past medical, surgical, family and social history reviewed and updated as indicated. Interim medical history since our last visit reviewed. Allergies and medications reviewed and updated.  Review of Systems  Constitutional: Negative.   HENT: Negative.    Respiratory:  Positive for shortness of breath. Negative for apnea, cough, choking, chest tightness, wheezing and stridor.   Cardiovascular: Negative.   Gastrointestinal: Negative.   Musculoskeletal: Negative.   Psychiatric/Behavioral: Negative.      Per HPI unless specifically indicated above     Objective:    BP (!) 137/59   Pulse 79   Temp 98.2 F (36.8 C) (Oral)   Ht _0  (1.321 m)   Wt 100 lb 11.2 oz (45.7 kg)   LMP  (LMP Unknown)   SpO2 92%   BMI 26.18 kg/m   Wt Readings from Last 3 Encounters:  03/03/22 100 lb 11.2 oz (45.7 kg)  02/21/22 96 lb 6.4 oz  (43.7 kg)  02/10/22 88 lb (39.9 kg)    Physical Exam Vitals and nursing note reviewed.  Constitutional:      General: She is not in acute distress.    Appearance: Normal appearance. She is normal weight. She is not ill-appearing, toxic-appearing or diaphoretic.  HENT:     Head: Normocephalic and atraumatic.     Right Ear: External ear normal.     Left Ear: External ear normal.     Nose: Nose normal.     Mouth/Throat:     Mouth: Mucous membranes are moist.     Pharynx: Oropharynx is clear.  Eyes:     General: No scleral icterus.       Right eye: No discharge.        Left eye: No discharge.     Extraocular Movements: Extraocular movements intact.     Conjunctiva/sclera: Conjunctivae normal.     Pupils: Pupils are equal, round, and reactive to light.  Cardiovascular:     Rate and Rhythm: Normal rate and regular rhythm.     Pulses: Normal pulses.     Heart sounds: Normal heart sounds. No murmur heard.    No friction rub. No gallop.  Pulmonary:     Effort: Pulmonary effort is normal. No respiratory distress.     Breath sounds: No stridor. Rales (L base) present. No wheezing or rhonchi.  Chest:     Chest wall: No tenderness.  Musculoskeletal:        General: Normal range of motion.     Cervical back: Normal range of motion and neck supple.  Skin:    General: Skin is warm and dry.     Capillary Refill: Capillary refill takes less than 2 seconds.     Coloration: Skin is not jaundiced or pale.     Findings: Bruising (middle and low back) present. No erythema, lesion or rash.  Neurological:     General: No focal deficit present.     Mental Status: She is alert and oriented to person, place, and time. Mental status is at baseline.  Psychiatric:        Mood and Affect: Mood normal.        Behavior: Behavior normal.        Thought Content: Thought content normal.        Judgment: Judgment normal.     Results for orders placed or performed in visit on 02/02/22  CBC with  Differential/Platelet  Result Value Ref Range  WBC 9.9 3.4 - 10.8 x10E3/uL   RBC 3.87 3.77 - 5.28 x10E6/uL   Hemoglobin 11.4 11.1 - 15.9 g/dL   Hematocrit 33.9 (L) 34.0 - 46.6 %   MCV 88 79 - 97 fL   MCH 29.5 26.6 - 33.0 pg   MCHC 33.6 31.5 - 35.7 g/dL   RDW 15.2 11.7 - 15.4 %   Platelets 263 150 - 450 x10E3/uL   Neutrophils 72 Not Estab. %   Lymphs 20 Not Estab. %   Monocytes 8 Not Estab. %   Eos 0 Not Estab. %   Basos 0 Not Estab. %   Neutrophils Absolute 7.2 (H) 1.4 - 7.0 x10E3/uL   Lymphocytes Absolute 2.0 0.7 - 3.1 x10E3/uL   Monocytes Absolute 0.7 0.1 - 0.9 x10E3/uL   EOS (ABSOLUTE) 0.0 0.0 - 0.4 x10E3/uL   Basophils Absolute 0.0 0.0 - 0.2 x10E3/uL   Immature Granulocytes 0 Not Estab. %   Immature Grans (Abs) 0.0 0.0 - 0.1 x10E3/uL  Comprehensive metabolic panel  Result Value Ref Range   Glucose 87 70 - 99 mg/dL   BUN 14 8 - 27 mg/dL   Creatinine, Ser 0.66 0.57 - 1.00 mg/dL   eGFR 93 >59 mL/min/1.73   BUN/Creatinine Ratio 21 12 - 28   Sodium 137 134 - 144 mmol/L   Potassium 3.4 (L) 3.5 - 5.2 mmol/L   Chloride 96 96 - 106 mmol/L   CO2 29 20 - 29 mmol/L   Calcium 8.9 8.7 - 10.3 mg/dL   Total Protein 5.7 (L) 6.0 - 8.5 g/dL   Albumin 3.6 (L) 3.8 - 4.8 g/dL   Globulin, Total 2.1 1.5 - 4.5 g/dL   Albumin/Globulin Ratio 1.7 1.2 - 2.2   Bilirubin Total 0.3 0.0 - 1.2 mg/dL   Alkaline Phosphatase 80 44 - 121 IU/L   AST 24 0 - 40 IU/L   ALT 26 0 - 32 IU/L  Magnesium  Result Value Ref Range   Magnesium 1.8 1.6 - 2.3 mg/dL  VITAMIN D 25 Hydroxy (Vit-D Deficiency, Fractures)  Result Value Ref Range   Vit D, 25-Hydroxy 36.6 30.0 - 100.0 ng/mL  Vitamin B12  Result Value Ref Range   Vitamin B-12 1,056 232 - 1,245 pg/mL  Iron Binding Cap (TIBC)([facility])  Result Value Ref Range   Total Iron Binding Capacity 239 (L) 250 - 450 ug/dL   UIBC 185 118 - 369 ug/dL   Iron 54 27 - 139 ug/dL   Iron Saturation 23 15 - 55 %  Ferritin  Result Value Ref Range   Ferritin 101  15 - 150 ng/mL  Folate  Result Value Ref Range   Folate >20.0 >3.0 ng/mL      Assessment & Plan:   Problem List Items Addressed This Visit       Cardiovascular and Mediastinum   Essential hypertension - Primary   Relevant Medications   furosemide (LASIX) 20 MG tablet   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 (HCC)    Has gained weight, SOB and rales in LLL. Will treat for 5 days with lasix and recheck. Checking CXR. Continue to monitor.       Relevant Medications   furosemide (LASIX) 20 MG tablet     Other   Unintentional weight loss    Will check barium swallow to look for ?aspiration. Weight has improved.       Depression, recurrent ([place])    Improved, but still not great. Continue current regimen. Continue to  monitor. Call with any concerns.       Other Visit Diagnoses     SOB (shortness of breath)       Will swab for flu and covid and check CXR. Await results. Will treat with lasix. Call with any concerns.   Relevant Orders   SLP modified barium swallow   Veritor Flu A/B Waived   Novel Coronavirus, NAA ([facility])   DG Chest 2 View   Fall, initial encounter       Will check x-rays. Await results. Treat as needed.   Relevant Orders   DG Lumbar Spine Complete   DG Thoracic Spine W/Swimmers   Constipation, unspecified constipation type       Will treat with TID miralax and recheck 5 days. Call with any concerns. Warning signs to go to ER discussed.   Acute midline back pain, unspecified back location       Will check x-rays. Await results. Treat as needed.   Relevant Orders   DG Lumbar Spine Complete   DG Thoracic Spine W/Swimmers        Follow up plan: Return Tuesday at 11:20.

## 2022-03-03 NOTE — Assessment & Plan Note (Signed)
Has gained weight, SOB and rales in LLL. Will treat for 5 days with lasix and recheck. Checking CXR. Continue to monitor.

## 2022-03-04 ENCOUNTER — Other Ambulatory Visit: Payer: Self-pay

## 2022-03-04 ENCOUNTER — Encounter: Payer: Self-pay | Admitting: *Deleted

## 2022-03-04 ENCOUNTER — Inpatient Hospital Stay
Admission: EM | Admit: 2022-03-04 | Discharge: 2022-03-08 | DRG: 871 | Disposition: A | Discharge status: Home Health | Payer: Medicare Other | Attending: Internal Medicine | Admitting: Internal Medicine

## 2022-03-04 ENCOUNTER — Ambulatory Visit: Payer: Medicare Other | Admitting: Dietician

## 2022-03-04 ENCOUNTER — Telehealth: Payer: Self-pay | Admitting: Family Medicine

## 2022-03-04 ENCOUNTER — Emergency Department: Payer: Medicare Other

## 2022-03-04 DIAGNOSIS — E785 Hyperlipidemia, unspecified: Secondary | ICD-10-CM | POA: Diagnosis present

## 2022-03-04 DIAGNOSIS — K219 Gastro-esophageal reflux disease without esophagitis: Secondary | ICD-10-CM | POA: Diagnosis present

## 2022-03-04 DIAGNOSIS — Z825 Family history of asthma and other chronic lower respiratory diseases: Secondary | ICD-10-CM

## 2022-03-04 DIAGNOSIS — R339 Retention of urine, unspecified: Secondary | ICD-10-CM | POA: Diagnosis present

## 2022-03-04 DIAGNOSIS — J440 Chronic obstructive pulmonary disease with acute lower respiratory infection: Secondary | ICD-10-CM | POA: Diagnosis present

## 2022-03-04 DIAGNOSIS — F419 Anxiety disorder, unspecified: Secondary | ICD-10-CM | POA: Diagnosis present

## 2022-03-04 DIAGNOSIS — Z888 Allergy status to other drugs, medicaments and biological substances status: Secondary | ICD-10-CM

## 2022-03-04 DIAGNOSIS — Z66 Do not resuscitate: Secondary | ICD-10-CM | POA: Diagnosis present

## 2022-03-04 DIAGNOSIS — K5900 Constipation, unspecified: Secondary | ICD-10-CM | POA: Diagnosis present

## 2022-03-04 DIAGNOSIS — J9621 Acute and chronic respiratory failure with hypoxia: Secondary | ICD-10-CM

## 2022-03-04 DIAGNOSIS — Z85828 Personal history of other malignant neoplasm of skin: Secondary | ICD-10-CM | POA: Diagnosis not present

## 2022-03-04 DIAGNOSIS — R6520 Severe sepsis without septic shock: Secondary | ICD-10-CM | POA: Diagnosis present

## 2022-03-04 DIAGNOSIS — S20229A Contusion of unspecified back wall of thorax, initial encounter: Secondary | ICD-10-CM | POA: Diagnosis present

## 2022-03-04 DIAGNOSIS — I2510 Atherosclerotic heart disease of native coronary artery without angina pectoris: Secondary | ICD-10-CM | POA: Diagnosis present

## 2022-03-04 DIAGNOSIS — J189 Pneumonia, unspecified organism: Secondary | ICD-10-CM | POA: Diagnosis not present

## 2022-03-04 DIAGNOSIS — M8580 Other specified disorders of bone density and structure, unspecified site: Secondary | ICD-10-CM | POA: Diagnosis present

## 2022-03-04 DIAGNOSIS — Z885 Allergy status to narcotic agent status: Secondary | ICD-10-CM

## 2022-03-04 DIAGNOSIS — I110 Hypertensive heart disease with heart failure: Secondary | ICD-10-CM | POA: Diagnosis present

## 2022-03-04 DIAGNOSIS — Z1152 Encounter for screening for COVID-19: Secondary | ICD-10-CM

## 2022-03-04 DIAGNOSIS — F32A Depression, unspecified: Secondary | ICD-10-CM | POA: Diagnosis present

## 2022-03-04 DIAGNOSIS — I959 Hypotension, unspecified: Secondary | ICD-10-CM | POA: Diagnosis not present

## 2022-03-04 DIAGNOSIS — Z83438 Family history of other disorder of lipoprotein metabolism and other lipidemia: Secondary | ICD-10-CM

## 2022-03-04 DIAGNOSIS — G3184 Mild cognitive impairment, so stated: Secondary | ICD-10-CM | POA: Diagnosis present

## 2022-03-04 DIAGNOSIS — R0689 Other abnormalities of breathing: Secondary | ICD-10-CM | POA: Diagnosis not present

## 2022-03-04 DIAGNOSIS — Y92009 Unspecified place in unspecified non-institutional (private) residence as the place of occurrence of the external cause: Secondary | ICD-10-CM

## 2022-03-04 DIAGNOSIS — J441 Chronic obstructive pulmonary disease with (acute) exacerbation: Secondary | ICD-10-CM | POA: Diagnosis present

## 2022-03-04 DIAGNOSIS — A419 Sepsis, unspecified organism: Secondary | ICD-10-CM | POA: Diagnosis present

## 2022-03-04 DIAGNOSIS — F09 Unspecified mental disorder due to known physiological condition: Secondary | ICD-10-CM | POA: Diagnosis present

## 2022-03-04 DIAGNOSIS — Z7982 Long term (current) use of aspirin: Secondary | ICD-10-CM

## 2022-03-04 DIAGNOSIS — W19XXXA Unspecified fall, initial encounter: Secondary | ICD-10-CM | POA: Diagnosis present

## 2022-03-04 DIAGNOSIS — J439 Emphysema, unspecified: Secondary | ICD-10-CM | POA: Diagnosis present

## 2022-03-04 DIAGNOSIS — J449 Chronic obstructive pulmonary disease, unspecified: Secondary | ICD-10-CM | POA: Diagnosis present

## 2022-03-04 DIAGNOSIS — Z823 Family history of stroke: Secondary | ICD-10-CM

## 2022-03-04 DIAGNOSIS — W19XXXS Unspecified fall, sequela: Secondary | ICD-10-CM | POA: Diagnosis not present

## 2022-03-04 DIAGNOSIS — J90 Pleural effusion, not elsewhere classified: Secondary | ICD-10-CM | POA: Diagnosis not present

## 2022-03-04 DIAGNOSIS — Z7951 Long term (current) use of inhaled steroids: Secondary | ICD-10-CM

## 2022-03-04 DIAGNOSIS — I10 Essential (primary) hypertension: Secondary | ICD-10-CM | POA: Diagnosis present

## 2022-03-04 DIAGNOSIS — R062 Wheezing: Secondary | ICD-10-CM | POA: Diagnosis not present

## 2022-03-04 DIAGNOSIS — Z7901 Long term (current) use of anticoagulants: Secondary | ICD-10-CM

## 2022-03-04 DIAGNOSIS — Z8679 Personal history of other diseases of the circulatory system: Secondary | ICD-10-CM

## 2022-03-04 DIAGNOSIS — J180 Bronchopneumonia, unspecified organism: Secondary | ICD-10-CM | POA: Diagnosis present

## 2022-03-04 DIAGNOSIS — J9601 Acute respiratory failure with hypoxia: Secondary | ICD-10-CM | POA: Diagnosis not present

## 2022-03-04 DIAGNOSIS — R0902 Hypoxemia: Secondary | ICD-10-CM | POA: Diagnosis not present

## 2022-03-04 DIAGNOSIS — I5032 Chronic diastolic (congestive) heart failure: Secondary | ICD-10-CM | POA: Diagnosis present

## 2022-03-04 DIAGNOSIS — Z833 Family history of diabetes mellitus: Secondary | ICD-10-CM

## 2022-03-04 DIAGNOSIS — Z79899 Other long term (current) drug therapy: Secondary | ICD-10-CM | POA: Diagnosis not present

## 2022-03-04 DIAGNOSIS — D649 Anemia, unspecified: Secondary | ICD-10-CM | POA: Diagnosis present

## 2022-03-04 DIAGNOSIS — Z87891 Personal history of nicotine dependence: Secondary | ICD-10-CM | POA: Diagnosis not present

## 2022-03-04 DIAGNOSIS — I480 Paroxysmal atrial fibrillation: Secondary | ICD-10-CM | POA: Diagnosis present

## 2022-03-04 DIAGNOSIS — Z7902 Long term (current) use of antithrombotics/antiplatelets: Secondary | ICD-10-CM

## 2022-03-04 DIAGNOSIS — Z882 Allergy status to sulfonamides status: Secondary | ICD-10-CM

## 2022-03-04 DIAGNOSIS — Z880 Allergy status to penicillin: Secondary | ICD-10-CM

## 2022-03-04 DIAGNOSIS — T380X5A Adverse effect of glucocorticoids and synthetic analogues, initial encounter: Secondary | ICD-10-CM | POA: Diagnosis present

## 2022-03-04 DIAGNOSIS — Z803 Family history of malignant neoplasm of breast: Secondary | ICD-10-CM

## 2022-03-04 DIAGNOSIS — Z8249 Family history of ischemic heart disease and other diseases of the circulatory system: Secondary | ICD-10-CM

## 2022-03-04 LAB — COMPREHENSIVE METABOLIC PANEL
ALT: 23 U/L (ref 0–44)
AST: 29 U/L (ref 15–41)
Albumin: 3 g/dL — ABNORMAL LOW (ref 3.5–5.0)
Alkaline Phosphatase: 67 U/L (ref 38–126)
Anion gap: 10 (ref 5–15)
BUN: 12 mg/dL (ref 8–23)
CO2: 25 mmol/L (ref 22–32)
Calcium: 8.7 mg/dL — ABNORMAL LOW (ref 8.9–10.3)
Chloride: 98 mmol/L (ref 98–111)
Creatinine, Ser: 0.57 mg/dL (ref 0.44–1.00)
GFR, Estimated: >60 mL/min (ref >60)
Glucose, Bld: 128 mg/dL — ABNORMAL HIGH (ref 70–99)
Potassium: 3.5 mmol/L (ref 3.5–5.1)
Sodium: 133 mmol/L — ABNORMAL LOW (ref 135–145)
Total Bilirubin: 0.7 mg/dL (ref 0.3–1.2)
Total Protein: 6.1 g/dL — ABNORMAL LOW (ref 6.5–8.1)

## 2022-03-04 LAB — CBC WITH DIFFERENTIAL/PLATELET
Abs Immature Granulocytes: 0.06 K/uL (ref 0.00–0.07)
Basophils Absolute: 0 K/uL (ref 0.0–0.1)
Basophils Relative: 0 %
Eosinophils Absolute: 0 K/uL (ref 0.0–0.5)
Eosinophils Relative: 0 %
HCT: 24.7 % — ABNORMAL LOW (ref 36.0–46.0)
Hemoglobin: 8 g/dL — ABNORMAL LOW (ref 12.0–15.0)
Immature Granulocytes: 1 %
Lymphocytes Relative: 27 %
Lymphs Abs: 3.5 K/uL (ref 0.7–4.0)
MCH: 29.9 pg (ref 26.0–34.0)
MCHC: 32.4 g/dL (ref 30.0–36.0)
MCV: 92.2 fL (ref 80.0–100.0)
Monocytes Absolute: 0.9 K/uL (ref 0.1–1.0)
Monocytes Relative: 7 %
Neutro Abs: 8.2 K/uL — ABNORMAL HIGH (ref 1.7–7.7)
Neutrophils Relative %: 65 %
Platelets: 202 K/uL (ref 150–400)
RBC: 2.68 MIL/uL — ABNORMAL LOW (ref 3.87–5.11)
RDW: 18.2 % — ABNORMAL HIGH (ref 11.5–15.5)
WBC: 12.8 K/uL — ABNORMAL HIGH (ref 4.0–10.5)
nRBC: 0 % (ref 0.0–0.2)

## 2022-03-04 LAB — RESP PANEL BY RT-PCR (RSV, FLU A&B, COVID)  RVPGX2
Influenza A by PCR: NEGATIVE
Influenza B by PCR: NEGATIVE
Resp Syncytial Virus by PCR: NEGATIVE
SARS Coronavirus 2 by RT PCR: NEGATIVE

## 2022-03-04 LAB — BLOOD GAS, VENOUS
Acid-Base Excess: 5.8 mmol/L — ABNORMAL HIGH (ref 0.0–2.0)
Bicarbonate: 31.7 mmol/L — ABNORMAL HIGH (ref 20.0–28.0)
O2 Saturation: 60.1 %
Patient temperature: 37
pCO2, Ven: 50 mmHg (ref 44–60)
pH, Ven: 7.41 (ref 7.25–7.43)
pO2, Ven: 41 mmHg (ref 32–45)

## 2022-03-04 LAB — URINALYSIS, COMPLETE (UACMP) WITH MICROSCOPIC
Bacteria, UA: NONE SEEN
Bilirubin Urine: NEGATIVE
Glucose, UA: NEGATIVE mg/dL
Hgb urine dipstick: NEGATIVE
Ketones, ur: NEGATIVE mg/dL
Leukocytes,Ua: NEGATIVE
Nitrite: NEGATIVE
Protein, ur: 30 mg/dL — AB
Specific Gravity, Urine: 1.006 (ref 1.005–1.030)
pH: 6 (ref 5.0–8.0)

## 2022-03-04 LAB — LACTIC ACID, PLASMA
Lactic Acid, Venous: 0.9 mmol/L (ref 0.5–1.9)
Lactic Acid, Venous: 0.7 mmol/L (ref 0.5–1.9)

## 2022-03-04 LAB — TROPONIN I (HIGH SENSITIVITY)
Troponin I (High Sensitivity): 15 ng/L (ref <18)
Troponin I (High Sensitivity): 18 ng/L — ABNORMAL HIGH (ref <18)

## 2022-03-04 LAB — PROTIME-INR
INR: 1 (ref 0.8–1.2)
Prothrombin Time: 13.5 s (ref 11.4–15.2)

## 2022-03-04 LAB — PROCALCITONIN: Procalcitonin: <0.1 ng/mL

## 2022-03-04 LAB — LIPASE, BLOOD: Lipase: 34 U/L (ref 11–51)

## 2022-03-04 LAB — BRAIN NATRIURETIC PEPTIDE: B Natriuretic Peptide: 442 pg/mL — ABNORMAL HIGH (ref 0.0–100.0)

## 2022-03-04 LAB — MRSA NEXT GEN BY PCR, NASAL: MRSA by PCR Next Gen: NOT DETECTED

## 2022-03-04 MED ORDER — ACETAMINOPHEN 650 MG RE SUPP: 650.0000 mg | Freq: Four times a day (QID) | RECTAL | Status: DC | PRN

## 2022-03-04 MED ORDER — BISACODYL 10 MG RE SUPP
10.0000 mg | Freq: Once | RECTAL | Status: AC
  Administered 2022-03-04: 10 mg via RECTAL
  Filled 2022-03-04: qty 1

## 2022-03-04 MED ORDER — IPRATROPIUM-ALBUTEROL 0.5-2.5 (3) MG/3ML IN SOLN
3.0000 mL | Freq: Once | RESPIRATORY_TRACT | Status: AC
  Administered 2022-03-04: 3 mL via RESPIRATORY_TRACT
  Filled 2022-03-04: qty 3

## 2022-03-04 MED ORDER — LOSARTAN POTASSIUM 25 MG PO TABS
25.0000 mg | ORAL_TABLET | Freq: Every day | ORAL | Status: DC
  Administered 2022-03-05 – 2022-03-08 (×4): 25 mg via ORAL
  Filled 2022-03-04 (×4): qty 1

## 2022-03-04 MED ORDER — ONDANSETRON HCL 4 MG PO TABS: 4.0000 mg | ORAL_TABLET | Freq: Four times a day (QID) | ORAL | Status: DC | PRN

## 2022-03-04 MED ORDER — NICOTINE 14 MG/24HR TD PT24
14.0000 mg | MEDICATED_PATCH | Freq: Every day | TRANSDERMAL | Status: DC
  Administered 2022-03-04 – 2022-03-08 (×5): 14 mg via TRANSDERMAL
  Filled 2022-03-04 (×5): qty 1

## 2022-03-04 MED ORDER — QUETIAPINE FUMARATE ER 50 MG PO TB24
50.0000 mg | ORAL_TABLET | Freq: Every day | ORAL | Status: DC
  Administered 2022-03-04: 50 mg via ORAL
  Filled 2022-03-04 (×7): qty 1

## 2022-03-04 MED ORDER — AMIODARONE HCL 200 MG PO TABS
200.0000 mg | ORAL_TABLET | Freq: Every day | ORAL | Status: DC
  Administered 2022-03-04 – 2022-03-08 (×5): 200 mg via ORAL
  Filled 2022-03-04 (×5): qty 1

## 2022-03-04 MED ORDER — SODIUM CHLORIDE 0.9 % IV SOLN
2.0000 g | INTRAVENOUS | Status: AC
  Administered 2022-03-04: 2 g via INTRAVENOUS
  Filled 2022-03-04: qty 12.5

## 2022-03-04 MED ORDER — PANTOPRAZOLE SODIUM 40 MG PO TBEC
40.0000 mg | DELAYED_RELEASE_TABLET | Freq: Two times a day (BID) | ORAL | Status: DC
  Administered 2022-03-04 – 2022-03-08 (×8): 40 mg via ORAL
  Filled 2022-03-04 (×8): qty 1

## 2022-03-04 MED ORDER — IPRATROPIUM-ALBUTEROL 0.5-2.5 (3) MG/3ML IN SOLN
3.0000 mL | Freq: Four times a day (QID) | RESPIRATORY_TRACT | Status: DC
  Administered 2022-03-04 – 2022-03-08 (×15): 3 mL via RESPIRATORY_TRACT
  Filled 2022-03-04 (×16): qty 3

## 2022-03-04 MED ORDER — SODIUM CHLORIDE 0.9 % IV SOLN
100.0000 mg | Freq: Two times a day (BID) | INTRAVENOUS | Status: AC
  Administered 2022-03-04 – 2022-03-06 (×5): 100 mg via INTRAVENOUS
  Filled 2022-03-04 (×5): qty 100

## 2022-03-04 MED ORDER — CLOPIDOGREL BISULFATE 75 MG PO TABS
75.0000 mg | ORAL_TABLET | Freq: Every day | ORAL | Status: DC
  Administered 2022-03-04 – 2022-03-06 (×3): 75 mg via ORAL
  Filled 2022-03-04 (×3): qty 1

## 2022-03-04 MED ORDER — LACTATED RINGERS IV SOLN: INTRAVENOUS | Status: AC

## 2022-03-04 MED ORDER — FUROSEMIDE 20 MG PO TABS
20.0000 mg | ORAL_TABLET | Freq: Every day | ORAL | Status: DC
  Administered 2022-03-05 – 2022-03-08 (×4): 20 mg via ORAL
  Filled 2022-03-04 (×4): qty 1

## 2022-03-04 MED ORDER — ENOXAPARIN SODIUM 30 MG/0.3ML IJ SOSY
30.0000 mg | PREFILLED_SYRINGE | INTRAMUSCULAR | Status: DC
  Administered 2022-03-04 – 2022-03-05 (×2): 30 mg via SUBCUTANEOUS
  Filled 2022-03-04 (×2): qty 0.3

## 2022-03-04 MED ORDER — AMLODIPINE BESYLATE 5 MG PO TABS
2.5000 mg | ORAL_TABLET | Freq: Every day | ORAL | Status: DC
  Administered 2022-03-04 – 2022-03-08 (×5): 2.5 mg via ORAL
  Filled 2022-03-04 (×5): qty 1

## 2022-03-04 MED ORDER — ENOXAPARIN SODIUM 40 MG/0.4ML IJ SOSY: 40.0000 mg | PREFILLED_SYRINGE | INTRAMUSCULAR | Status: DC

## 2022-03-04 MED ORDER — FLUTICASONE FUROATE-VILANTEROL 200-25 MCG/ACT IN AEPB
1.0000 | INHALATION_SPRAY | Freq: Every day | RESPIRATORY_TRACT | Status: DC
  Administered 2022-03-04 – 2022-03-08 (×5): 1 via RESPIRATORY_TRACT
  Filled 2022-03-04 (×2): qty 28

## 2022-03-04 MED ORDER — MIRTAZAPINE 15 MG PO TABS
45.0000 mg | ORAL_TABLET | Freq: Every day | ORAL | Status: DC
  Administered 2022-03-04 – 2022-03-07 (×4): 45 mg via ORAL
  Filled 2022-03-04 (×4): qty 3

## 2022-03-04 MED ORDER — METHYLPREDNISOLONE SODIUM SUCC 125 MG IJ SOLR
125.0000 mg | Freq: Two times a day (BID) | INTRAMUSCULAR | Status: DC
  Administered 2022-03-04 – 2022-03-06 (×5): 125 mg via INTRAVENOUS
  Filled 2022-03-04 (×5): qty 2

## 2022-03-04 MED ORDER — SODIUM CHLORIDE 0.9 % IV BOLUS (SEPSIS)
500.0000 mL | Freq: Once | INTRAVENOUS | Status: AC
  Administered 2022-03-04: 500 mL via INTRAVENOUS

## 2022-03-04 MED ORDER — VANCOMYCIN HCL IN DEXTROSE 1-5 GM/200ML-% IV SOLN
1000.0000 mg | INTRAVENOUS | Status: AC
  Administered 2022-03-04: 1000 mg via INTRAVENOUS
  Filled 2022-03-04: qty 200

## 2022-03-04 MED ORDER — ONDANSETRON HCL 4 MG/2ML IJ SOLN: 4.0000 mg | Freq: Four times a day (QID) | INTRAMUSCULAR | Status: DC | PRN

## 2022-03-04 MED ORDER — SODIUM CHLORIDE 0.9 % IV SOLN
2.0000 g | Freq: Two times a day (BID) | INTRAVENOUS | Status: DC
  Administered 2022-03-04 – 2022-03-08 (×8): 2 g via INTRAVENOUS
  Filled 2022-03-04 (×2): qty 2
  Filled 2022-03-04: qty 12.5
  Filled 2022-03-04: qty 2
  Filled 2022-03-04: qty 12.5
  Filled 2022-03-04: qty 2
  Filled 2022-03-04 (×4): qty 12.5
  Filled 2022-03-04: qty 2

## 2022-03-04 MED ORDER — POLYETHYLENE GLYCOL 3350 17 G PO PACK
17.0000 g | PACK | Freq: Three times a day (TID) | ORAL | Status: DC
  Administered 2022-03-04 – 2022-03-07 (×8): 17 g via ORAL
  Filled 2022-03-04 (×11): qty 1

## 2022-03-04 MED ORDER — METOPROLOL SUCCINATE ER 50 MG PO TB24
50.0000 mg | ORAL_TABLET | Freq: Every day | ORAL | Status: DC
  Administered 2022-03-04 – 2022-03-08 (×4): 50 mg via ORAL
  Filled 2022-03-04 (×5): qty 1

## 2022-03-04 MED ORDER — ISOSORBIDE MONONITRATE ER 30 MG PO TB24
60.0000 mg | ORAL_TABLET | Freq: Two times a day (BID) | ORAL | Status: DC
  Administered 2022-03-04 – 2022-03-08 (×7): 60 mg via ORAL
  Filled 2022-03-04 (×2): qty 2
  Filled 2022-03-04: qty 1
  Filled 2022-03-04 (×3): qty 2
  Filled 2022-03-04: qty 1
  Filled 2022-03-04: qty 2

## 2022-03-04 MED ORDER — VANCOMYCIN HCL 750 MG/150ML IV SOLN: 750.0000 mg | INTRAVENOUS | Status: DC

## 2022-03-04 MED ORDER — ACETAMINOPHEN 325 MG PO TABS: 650.0000 mg | ORAL_TABLET | Freq: Four times a day (QID) | ORAL | Status: DC | PRN

## 2022-03-04 MED ORDER — ATORVASTATIN CALCIUM 20 MG PO TABS
40.0000 mg | ORAL_TABLET | Freq: Every day | ORAL | Status: DC
  Administered 2022-03-04 – 2022-03-08 (×5): 40 mg via ORAL
  Filled 2022-03-04 (×5): qty 2

## 2022-03-04 NOTE — ED Provider Notes (Signed)
Vitals:   03/04/22 0700 03/04/22 0730  BP: (!) 167/60 (!) 154/47  Pulse: 88 84  Resp: (!) 31 (!) 22  Temp:    SpO2: 99%      Patient's work of breathing is improving.  She is currently alert and oriented tolerating BiPAP well.  I ordered broad-spectrum antibiotic coverage for possible healthcare associated pneumonia given review of her imaging.  She does have a mildly elevated BNP, but given her dyspnea hypoxia, and chest x-ray findings I do believe primary concern for COPD exacerbation underlying pneumonia as primary.  Does not appear acutely volume overloaded by clinical exam and had an echo less than 2 months ago with normal left ventricular function making pulmonary edema and CHF seemingly highly unlikely  The patient's INR COVID and influenza testing are negative.  Patient meets sepsis criteria with elevated white count, tachypnea and evidence of pneumonia.  Code sepsis has been initiated.  Consulted with hospitalist Dr. Mal Misty at 805am, Who has accepted   Kristi Kitten, MD 03/04/22 [phone]

## 2022-03-04 NOTE — ED Triage Notes (Signed)
Pt from home BIB [facility] c/o respiratory distress, pt has hx of COPD, wears 3L [place]. EMS reports sats 70% on 3L [place], tripoding, tachypneic administer 2 DuoNebs, 125 mg Solu-Medrol, and 2 gram magnesium IV in route. RR 40, HR 100s, sats 89% on 4L, congested cough noted.

## 2022-03-04 NOTE — Telephone Encounter (Signed)
Copied from [place] [id number]. Topic: General - Inquiry >> Mar 04, 2022 12:34 PM Erskine Squibb wrote: Reason for CRM: Kristi Clark with [facility] called in to report a missed home health pt visit. The patient had a 2 degree fever and cancelled appt due to caution. The patient will call him back to reshedule when she feels better. Please assist further if necessary

## 2022-03-04 NOTE — Progress Notes (Signed)
[place] code sepsis

## 2022-03-04 NOTE — ED Notes (Signed)
RT called for in line neb treatment.  And lab notified of need for 2nd set of blood cultures

## 2022-03-04 NOTE — ED Notes (Signed)
RT at bedside to apply BiPAP

## 2022-03-04 NOTE — ED Notes (Signed)
Pt on BIPAP by RT, pt handling it well at this time

## 2022-03-04 NOTE — ED Provider Notes (Signed)
[facility] Provider Note    Event Date/Time   First MD Initiated Contact with Patient 03/04/22 0630     (approximate)   History   Respiratory Distress   HPI  Level 5 caveat:  history/ROS limited by acute/critical illness  Kristi Clark is a 71 y.o. female with history of COPD on 3 L of oxygen at baseline.  She presents by EMS for severe respiratory distress.  She said that she has been gradually getting worse over the last couple of days.  Her breathing treatments help but not much.  When EMS arrived, she was in severe respiratory distress and her oxygen saturation was about 70% on her chronic 3 L.  She was tripoding and breathing only a few words at a time.  Prior to arrival, EMS provided DuoNebs x 2, Solu-Medrol 125 mg IV, and magnesium 2 g IV.  She is tachycardic and tachypneic when she arrived and is still working to breathe even though it has improved from prior.     Physical Exam   Triage Vital Signs: ED Triage Vitals  Enc Vitals Group     BP 03/04/22 0625 (!) 190/53     Pulse Rate 03/04/22 0625 98     Resp 03/04/22 0625 (!) 26     Temp 03/04/22 0625 98.8 F (37.1 C)     Temp Source 03/04/22 0625 Oral     SpO2 03/04/22 0625 (!) 89 %     Weight 03/04/22 0635 45.4 kg (100 lb)     Height 03/04/22 0635 1.321 m ('4\' 4"'$ )     Head Circumference --      Peak Flow --      Pain Score 03/04/22 0635 0     Pain Loc --      Pain Edu? --      Excl. in [place]? --     Most recent vital signs: Vitals:   03/04/22 0700 03/04/22 0730  BP: (!) 167/60 (!) 154/47  Pulse: 88 84  Resp: (!) 31 (!) 22  Temp:    SpO2: 99%      General: Awake, alert.  Appears chronically ill at baseline. CV:  Good peripheral perfusion. Regular rate/rhythm. Resp:  Increased respiratory effort, intercoastal retractions and accessory muscle usage.  Diminished breath sounds throughout, minimal air movement.  Desatted quickly to 70% on 3L O2 [place] during initial resuscitation  (with good waveform). Abd:  No distention. No tenderness to palpation. Other:  AOx3, no focal neurological deficits.   ED Results / Procedures / Treatments   Labs (all labs ordered are listed, but only abnormal results are displayed) Labs Reviewed  COMPREHENSIVE METABOLIC PANEL - Abnormal; Notable for the following components:      Result Value   Sodium 133 (*)    Glucose, Bld 128 (*)    Calcium 8.7 (*)    Total Protein 6.1 (*)    Albumin 3.0 (*)    All other components within normal limits  CBC WITH DIFFERENTIAL/PLATELET - Abnormal; Notable for the following components:   WBC 12.8 (*)    RBC 2.68 (*)    Hemoglobin 8.0 (*)    HCT 24.7 (*)    RDW 18.2 (*)    Neutro Abs 8.2 (*)    All other components within normal limits  BRAIN NATRIURETIC PEPTIDE - Abnormal; Notable for the following components:   B Natriuretic Peptide 442.0 (*)    All other components within normal limits  BLOOD GAS, VENOUS -  Abnormal; Notable for the following components:   Bicarbonate 31.7 (*)    Acid-Base Excess 5.8 (*)    All other components within normal limits  RESP PANEL BY RT-PCR (RSV, FLU A&B, COVID)  RVPGX2  URINE CULTURE  CULTURE, BLOOD (SINGLE)  CULTURE, BLOOD (SINGLE)  MRSA NEXT GEN BY PCR, NASAL  LACTIC ACID, PLASMA  PROTIME-INR  LIPASE, BLOOD  LACTIC ACID, PLASMA  URINALYSIS, COMPLETE (UACMP) WITH MICROSCOPIC  TYPE AND SCREEN  TROPONIN I (HIGH SENSITIVITY)  TROPONIN I (HIGH SENSITIVITY)     EKG  ED ECG REPORT I, Hinda Kehr, the attending physician, personally viewed and interpreted this ECG.  Date: 03/04/2022 EKG Time: 06:44 Rate: 87 Rhythm: normal sinus rhythm QRS Axis: normal Intervals: normal ST/T Wave abnormalities: Non-specific ST segment / T-wave changes, but no clear evidence of acute ischemia. Narrative Interpretation: no definitive evidence of acute ischemia; does not meet STEMI criteria.  Artifact due to respiratory distress is present throughout  EKG.    RADIOLOGY I viewed and interpreted the patient's 1 view chest x-ray.  Is consistent with COPD as well as possible developing bronchopneumonia.  Radiologist report agrees with the interpretation.    PROCEDURES:  Critical Care performed: Yes, see critical care procedure note(s)  .1-3 Lead EKG Interpretation  Performed by: Hinda Kehr, MD Authorized by: Hinda Kehr, MD     Interpretation: normal     ECG rate:  95   ECG rate assessment: normal     Rhythm: sinus rhythm     Ectopy: none     Conduction: normal   .Critical Care  Performed by: Hinda Kehr, MD Authorized by: Hinda Kehr, MD   Critical care provider statement:    Critical care time (minutes):  30   Critical care time was exclusive of:  Separately billable procedures and treating other patients   Critical care was necessary to treat or prevent imminent or life-threatening deterioration of the following conditions:  Respiratory failure   Critical care was time spent personally by me on the following activities:  Development of treatment plan with patient or surrogate, evaluation of patient's response to treatment, examination of patient, obtaining history from patient or surrogate, ordering and performing treatments and interventions, ordering and review of laboratory studies, ordering and review of radiographic studies, pulse oximetry, re-evaluation of patient's condition and review of old charts    MEDICATIONS ORDERED IN ED: Medications  lactated ringers infusion (has no administration in time range)  ceFEPIme (MAXIPIME) 2 g in sodium chloride 0.9 % 100 mL IVPB (has no administration in time range)  vancomycin (VANCOCIN) IVPB 1000 mg/200 mL premix (has no administration in time range)  sodium chloride 0.9 % bolus 500 mL (0 mLs Intravenous Stopped 03/04/22 0747)  ipratropium-albuterol (DUONEB) 0.5-2.5 (3) MG/3ML nebulizer solution 3 mL (3 mLs Nebulization Given 03/04/22 0755)     IMPRESSION / MDM /  [place] / ED COURSE  I reviewed the triage vital signs and the nursing notes.                              Differential diagnosis includes, but is not limited to, viral infection, COPD exacerbation, pneumonia, electrolyte or metabolic abnormality.  Patient's presentation is most consistent with acute presentation with potential threat to life or bodily function.  Labs/studies ordered: 1 view chest x-ray, CBC with differential, lipase, respiratory viral panel, BNP, lactic acid, pro time-INR, CMP, high-sensitivity troponin, 1 blood culture, VBG.  No  evidence of ischemia on EKG.  Patient quickly becomes severely hypoxic when she is on her regular 3 L of oxygen by nasal cannula.  She remains in moderate respiratory distress with minimal air movement although she is apparently quite improved over her initial presentation with EMS.  I ordered an additional DuoNeb as well as normal saline 500 mL IV bolus for the insensible losses.  I doubt acute bacterial infection at this time.  I ordered BiPAP given the obvious need for positive pressure ventilation.  She has not been having any viral symptoms.  Respiratory viral panel is negative.  The patient is on the cardiac monitor to evaluate for evidence of arrhythmia and/or significant heart rate changes.   Clinical Course as of 03/04/22 0805  Fri Mar 04, 2022  1610 Patient reports that she is DNR [CF]  0720 I transferred ED care to Dr. Jacqualine Code.  We viewed and interpreted her chest x-ray together and it is concerning for developing pneumonia.  The radiologist also expressed concern about superimposed pneumonia developing on top of her chronic COPD.  Dr. Wilber Bihari is ordering additional medications and treatments.  I reassessed the patient and she is looking and feeling much better on BiPAP.  She is able to speak in full sentences with the mask on.  She understands the need for hospitalization.  Her daughter is at bedside and also agrees with the  plan.  I also confirmed with her the wishes she expressed for treatment but no intubation and no chest compressions, and she confirmed that those are her wishes as documented in the computer. [CF]    Clinical Course User Index [CF] Hinda Kehr, MD     FINAL CLINICAL IMPRESSION(S) / ED DIAGNOSES   Final diagnoses:  Acute on chronic respiratory failure with hypoxia ([place])  COPD exacerbation ([place])  Bronchopneumonia     Rx / DC Orders   ED Discharge Orders     None        Note:  This document was prepared using Dragon voice recognition software and may include unintentional dictation errors.   Hinda Kehr, MD 03/04/22 [phone]

## 2022-03-04 NOTE — Progress Notes (Signed)
Pt taken off bipap and placed on 3lpm [place], tolerating well at this time. Respiratory rate 18/min, HR 84/min, will continue to monitor;

## 2022-03-04 NOTE — Progress Notes (Signed)
CODE SEPSIS - PHARMACY COMMUNICATION  **Broad Spectrum Antibiotics should be administered within 1 hour of Sepsis diagnosis**  Time Code Sepsis Called/Page Received: 5051  Antibiotics Ordered: cefepime, vanc  Time of 1st antibiotic administration: 0825  Additional action taken by pharmacy: messaged RN '@0825'$  and RN said "working on"  If necessary, Name of Provider/Nurse Contacted:      Noralee Space ,PharmD Clinical Pharmacist  03/04/2022  8:45 AM

## 2022-03-04 NOTE — H&P (Addendum)
History and Physical:    Kristi Clark   FTD:[id number] DOB: 1950-08-21 DOA: 03/04/2022  Referring MD/provider: Delman Kitten, MD PCP: [name], DO   Patient coming from: Home  Chief Complaint: Shortness of breath  History of Present Illness:   Kristi Clark is a 71 y.o. female with medical history significant for severe COPD, recent discharge from the hospital on 01/21/2022 for COPD exacerbation with acute respiratory failure, chronic hypoxic respiratory failure on 3 L/min oxygen, tobacco use disorder, CAD, interstitial lung disease, hypertension, hyperlipidemia, depression, GERD, who presented to the hospital because of shortness of breath of 2 days duration.  Shortness of breath has progressively worsened.  She also complained of cough and fever.  Her daughter said she had a fever with temperature of 101 F.  She fell on 02/25/2022 and sustained some bruises on her back.  Reportedly, her oxygen saturation was 70% on 3 L/min oxygen via nasal cannula when EMS arrived.  She was treated with DuoNebs, IV Solu-Medrol and IV magnesium en route to the emergency room.  She complains of constipation and feels impacted although she moved her bowels a day prior to admission.  No chest pain, vomiting, diarrhea, abdominal pain  ED Course:  The patient was given IV fluids, antibiotics, steroids and BiPAP.  ROS:   ROS all other systems reviewed were negative.  Past Medical History:   Past Medical History:  Diagnosis Date   AK (actinic keratosis) 10/25/2017   left forehead   BCC (basal cell carcinoma) 10/25/2017   left upper lateral eyelid, Moh's   COPD (chronic obstructive pulmonary disease) (HCC)    Depression    GERD (gastroesophageal reflux disease)    Hyperlipidemia    Hypertension    Menopause    Osteopenia    Tobacco abuse     Past Surgical History:   Past Surgical History:  Procedure Laterality Date   CARDIAC CATHETERIZATION     CHOLECYSTECTOMY  2014    COLONOSCOPY WITH PROPOFOL N/A 01/16/2018   Procedure: COLONOSCOPY WITH PROPOFOL;  Surgeon: Lollie Sails, MD;  Location: [facility] ENDOSCOPY;  Service: Endoscopy;  Laterality: N/A;   ESOPHAGOGASTRODUODENOSCOPY (EGD) WITH PROPOFOL N/A 01/16/2018   Procedure: ESOPHAGOGASTRODUODENOSCOPY (EGD) WITH PROPOFOL;  Surgeon: Lollie Sails, MD;  Location: [facility] ENDOSCOPY;  Service: Endoscopy;  Laterality: N/A;   heart stent     LEFT HEART CATH AND CORONARY ANGIOGRAPHY N/A 09/17/2021   Procedure: LEFT HEART CATH AND CORONARY ANGIOGRAPHY;  Surgeon: Dionisio David, MD;  Location: [place] [facility];  Service: Cardiovascular;  Laterality: N/A;   TEE WITHOUT CARDIOVERSION N/A 05/11/2021   Procedure: TRANSESOPHAGEAL ECHOCARDIOGRAM (TEE);  Surgeon: Corey Skains, MD;  Location: [facility] ORS;  Service: Cardiovascular;  Laterality: N/A;   TOTAL ABDOMINAL HYSTERECTOMY      Social History:   Social History   Socioeconomic History   Marital status: Widowed    Spouse name: Not on file   Number of children: 2   Years of education: Not on file   Highest education level: 10th grade  Occupational History   Occupation: retired  Tobacco Use   Smoking status: Former    Packs/day: 1.00    Years: 56.00    Total pack years: 56.00    Types: Cigarettes   Smokeless tobacco: Never  Vaping Use   Vaping Use: Never used  Substance and Sexual Activity   Alcohol use: No   Drug use: No   Sexual activity: Never  Other Topics Concern  Not on file  Social History Narrative   Lives with great grandson, Argentina Ponder   Social Determinants of Health   Financial Resource Strain: Low Risk  (05/31/2021)   Overall Financial Resource Strain (CARDIA)    Difficulty of Paying Living Expenses: Not hard at all  Food Insecurity: No Food Insecurity (02/02/2022)   Hunger Vital Sign    Worried About Running Out of Food in the Last Year: Never true    Ran Out of Food in the Last Year: Never true  Transportation Needs: No  Transportation Needs (02/02/2022)   PRAPARE - Hydrologist (Medical): No    Lack of Transportation (Non-Medical): No  Physical Activity: Inactive (05/31/2021)   Exercise Vital Sign    Days of Exercise per Week: 0 days    Minutes of Exercise per Session: 0 min  Stress: No Stress Concern Present (05/31/2021)   [place]    Feeling of Stress : Not at all  Social Connections: Socially Isolated (05/31/2021)   Social Connection and Isolation Panel [NHANES]    Frequency of Communication with Friends and Family: More than three times a week    Frequency of Social Gatherings with Friends and Family: Twice a week    Attends Religious Services: Never    Marine scientist or Organizations: No    Attends Archivist Meetings: Never    Marital Status: Widowed  Intimate Partner Violence: Not At Risk (01/14/2022)   Humiliation, Afraid, Rape, and Kick questionnaire    Fear of Current or Ex-Partner: No    Emotionally Abused: No    Physically Abused: No    Sexually Abused: No    Allergies   Clindamycin/lincomycin, Prednisone, Amoxicillin, Avelox [moxifloxacin hcl in nacl], Codeine sulfate, Penicillins, and Sulfa antibiotics  Family history:   Family History  Problem Relation Age of Onset   Cancer Mother    Stroke Mother    Heart disease Father    Hyperlipidemia Father    Cancer Sister    Diabetes Sister    Breast cancer Sister 51   Diabetes Brother    Asthma Son    Cancer Son    Diabetes Daughter    Hypertension Daughter    Cancer Maternal Grandmother        gallbladder   Diabetes Brother    Heart disease Brother    Breast cancer Paternal Aunt     Current Medications:   Prior to Admission medications   Medication Sig Start Date End Date Taking? Authorizing Provider  albuterol (PROVENTIL) (2.5 MG/3ML) 0.083% nebulizer solution Take 3 mLs (2.5 mg total) by nebulization every  6 (six) hours as needed for wheezing or shortness of breath. 11/02/21   Johnson, Megan P, DO  albuterol (VENTOLIN HFA) 108 (90 Base) MCG/ACT inhaler Inhale 2 puffs into the lungs every 6 (six) hours as needed for wheezing or shortness of breath. 08/16/21   Park Liter P, DO  amiodarone (PACERONE) 200 MG tablet Take 1 tablet (200 mg total) by mouth daily. 01/22/22 02/21/22  Wyvonnia Dusky, MD  amLODipine (NORVASC) 2.5 MG tablet Take 1 tablet (2.5 mg total) by mouth daily. 02/21/22   Johnson, Megan P, DO  aspirin 81 MG tablet Take 81 mg by mouth daily.    [provider]  atorvastatin (LIPITOR) 40 MG tablet Take 1 tablet (40 mg total) by mouth daily. 08/16/21   Park Liter P, DO  Blood Pressure Monitoring (  BLOOD PRESSURE MONITOR/WRIST) KIT Take Blood pressure as needed, Dx: I12.9 12/09/20   Johnson, Megan P, DO  cetirizine (ZYRTEC) 10 MG tablet Take 10 mg by mouth daily as needed for allergies.    [provider]  clopidogrel (PLAVIX) 75 MG tablet Take 1 tablet (75 mg total) by mouth daily. 01/11/22   Johnson, Megan P, DO  fluticasone-salmeterol (ADVAIR DISKUS) 250-50 MCG/ACT AEPB Inhale 1 puff into the lungs in the morning and at bedtime. 08/16/21   Johnson, Megan P, DO  furosemide (LASIX) 20 MG tablet Take 1 tablet (20 mg total) by mouth daily. 03/03/22   Johnson, Megan P, DO  hydrALAZINE (APRESOLINE) 25 MG tablet Take 0.5 tablets (12.5 mg total) by mouth 2 (two) times daily. 10/05/21   Johnson, Megan P, DO  hydrochlorothiazide (MICROZIDE) 12.5 MG capsule Take 1 capsule (12.5 mg total) by mouth daily as needed. Patient taking differently: Take 12.5 mg by mouth daily as needed (fluid retention). 12/27/21   Johnson, Megan P, DO  isosorbide mononitrate (IMDUR) 60 MG 24 hr tablet Take 1 tablet (60 mg total) by mouth 2 (two) times daily. 09/10/21   Johnson, Megan P, DO  losartan (COZAAR) 25 MG tablet Take 1 tablet (25 mg total) by mouth daily. 10/12/21   Johnson, Megan P, DO  metoprolol  succinate (TOPROL-XL) 50 MG 24 hr tablet Take 1 tablet (50 mg total) by mouth daily. Take with or immediately following a meal. 01/11/22   Johnson, Megan P, DO  mirtazapine (REMERON) 45 MG tablet Take 1 tablet (45 mg total) by mouth at bedtime. 12/21/21   Johnson, Megan P, DO  Multiple Vitamins-Minerals (CENTRUM SILVER ADULT 50+ PO) Take 1 tablet by mouth daily.    [provider]  Multiple Vitamins-Minerals (PRESERVISION AREDS 2+MULTI VIT) CAPS Take 1 capsule by mouth at bedtime.    [provider]  nicotine (NICODERM CQ - DOSED IN MG/24 HOURS) 14 mg/24hr patch Place 1 patch (14 mg total) onto the skin daily. 02/02/22   Cannady, Henrine Screws T, NP  pantoprazole (PROTONIX) 40 MG tablet Take 1 tablet (40 mg total) by mouth 2 (two) times daily. 01/21/22 01/21/23  Wyvonnia Dusky, MD  polyethylene glycol powder (GLYCOLAX/MIRALAX) 17 GM/SCOOP powder Take 17 g by mouth 3 (three) times daily. 03/03/22   Johnson, Megan P, DO  QUEtiapine (SEROQUEL XR) 50 MG TB24 24 hr tablet Take 1 tablet (50 mg total) by mouth at bedtime. 02/21/22   Johnson, Megan P, DO  saline (AYR) GEL Place 1 Application into both nostrils every 4 (four) hours as needed. 12/10/21   Johnson, Megan P, DO  tiotropium (SPIRIVA) 18 MCG inhalation capsule Place 1 capsule (18 mcg total) into inhaler and inhale daily. 03/29/21   Jon Billings, NP    Physical Exam:   Vitals:   03/04/22 0635 03/04/22 0700 03/04/22 0730 03/04/22 0830  BP: (!) 183/56 (!) 167/60 (!) 154/47 (!) 134/38  Pulse: 96 88 84 78  Resp: (!) 32 (!) 31 (!) 22 (!) 24  Temp:      TempSrc:      SpO2: 94% 99%  99%  Weight: 45.4 kg     Height: _0  (1.321 m)        Physical Exam: Blood pressure (!) 134/38, pulse 78, temperature 98.8 F (37.1 C), temperature source Oral, resp. rate (!) 24, height _1  (1.321 m), weight 45.4 kg, SpO2 99 %. Gen: No acute distress.  On BiPAP Head: Normocephalic, atraumatic. Eyes: Pupils equal, round and reactive  to  light. Extraocular movements intact.  Sclerae nonicteric.  Mouth: Moist mucous membranes Neck: Supple, no thyromegaly, no lymphadenopathy, no jugular venous distention. Chest: Decreased air entry bilaterally, mild bilateral wheezing, bibasilar rales CV: Heart sounds are regular with an S1, S2. No murmurs, rubs or gallops.  Abdomen: Soft, nontender, nondistended with normal active bowel sounds. No palpable masses. Extremities: Extremities are without clubbing, or cyanosis. No edema. Pedal pulses 2+.  Skin: Warm and dry.  Ecchymosis/bruises on the back.  Bruises on right shoulder and right hip Neuro: Alert and oriented times 3; grossly nonfocal.  Psych: Insight is good and judgment is appropriate. Mood and affect normal.       Data Review:    Labs: Basic Metabolic Panel: Recent Labs  Lab 03/04/22 0639  NA 133*  K 3.5  CL 98  CO2 25  GLUCOSE 128*  BUN 12  CREATININE 0.57  CALCIUM 8.7*   Liver Function Tests: Recent Labs  Lab 03/04/22 0639  AST 29  ALT 23  ALKPHOS 67  BILITOT 0.7  PROT 6.1*  ALBUMIN 3.0*   Recent Labs  Lab 03/04/22 0639  LIPASE 34   No results for input(s): "AMMONIA" in the last 168 hours. CBC: Recent Labs  Lab 03/04/22 0639  WBC 12.8*  NEUTROABS 8.2*  HGB 8.0*  HCT 24.7*  MCV 92.2  PLT 202   Cardiac Enzymes: No results for input(s): "CKTOTAL", "CKMB", "CKMBINDEX", "TROPONINI" in the last 168 hours.  BNP (last 3 results) No results for input(s): "PROBNP" in the last 8760 hours. CBG: No results for input(s): "GLUCAP" in the last 168 hours.  Urinalysis    Component Value Date/Time   COLORURINE YELLOW (A) 01/13/2022 0352   APPEARANCEUR CLEAR (A) 01/13/2022 0352   APPEARANCEUR Clear 12/27/2021 1316   LABSPEC 1.011 01/13/2022 0352   PHURINE 6.0 01/13/2022 0352   GLUCOSEU NEGATIVE 01/13/2022 0352   HGBUR NEGATIVE 01/13/2022 0352   BILIRUBINUR NEGATIVE 01/13/2022 0352   BILIRUBINUR Negative 12/27/2021 1316   KETONESUR 5 (A)  01/13/2022 0352   PROTEINUR 30 (A) 01/13/2022 0352   NITRITE NEGATIVE 01/13/2022 0352   LEUKOCYTESUR NEGATIVE 01/13/2022 0352      Radiographic Studies: DG Chest Port 1 View  Result Date: 03/04/2022 CLINICAL DATA:  71 year old female with history of shortness of breath. EXAM: PORTABLE CHEST 1 VIEW COMPARISON:  Chest x-ray 03/03/2022. FINDINGS: Lung volumes are increased with emphysematous changes. Diffuse interstitial prominence, peribronchial cuffing and patchy ill-defined opacities scattered throughout the lungs bilaterally, increased slightly compared to the prior study. Small left and trace right pleural effusions. No pneumothorax. Heart size is normal. Upper mediastinal contours are within normal limits. Atherosclerotic calcifications in the thoracic aorta. IMPRESSION: 1. 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 2. Aortic atherosclerosis. Electronically Signed   By: Vinnie Langton M.D.   On: 03/04/2022 07:07    EKG: Independently reviewed by me showed normal sinus rhythm.    Assessment/Plan:   Principal Problem:   Acute on chronic respiratory failure with hypoxemia (HCC) Active Problems:   Sepsis ([place])   COPD with acute exacerbation (HCC)   Bilateral pneumonia    Body mass index is 26 kg/m.     COPD exacerbation: Admit to progressive care unit.  Treated with  steroids, bronchodilators and antibiotics   Sepsis secondary to bilateral pneumonia: Treat with IV vancomycin and cefepime.  Check urine antigen for strep pneumonia and Legionella.  Follow-up blood cultures.  Acute on chronic hypoxic respiratory failure: Continue BiPAP for acute  respiratory failure.  She uses 3 L/min oxygen at baseline.   Paroxysmal atrial fibrillation: Continue metoprolol and amiodarone   CAD: Continue aspirin and Plavix   Recent fall at home, general weakness: PT and OT evaluation  Underlying interstitial lung disease, questionable MAI on chest x-ray: Outpatient follow-up with pulmonologist      Other information:   DVT prophylaxis: enoxaparin (LOVENOX) injection 40 mg Start: 03/04/22 0930 SCDs Start: 03/04/22 0920  Code Status: DNR per patient request..  This was confirmed by Alyse Low, daughter, at the bedside Family Communication: Plan discussed with Alyse Low, daughter, at the bedside Disposition Plan: Plan to discharge home in 3 days Consults called: None Admission status: Inpatient  The medical decision making on this patient was of high complexity and the patient is at high risk for clinical deterioration, therefore this is a level 3 visit.    Time spent  [place] [facility] Pager: Please check [email or web address]   How to contact the [facility] Attending or Consulting provider [place] or covering provider during after hours [place], for this patient?   Check the care team in [facility] and look for a) attending/consulting TR[facility] provider listed and b) the [facility] team listed Log into [email or web address] and use [place]'s universal password to access. If you do not have the password, please contact the hospital operator. Locate the [facility] provider you are looking for under [facility] and page to a number that you can be directly reached. If you still have difficulty reaching the provider, please page the [facility] (Director on Call) for the Hospitalists listed on amion for assistance.  03/04/2022, 9:29 AM

## 2022-03-04 NOTE — ED Notes (Signed)
Patient has been transitioned off bipap

## 2022-03-04 NOTE — Progress Notes (Signed)
PHARMACY -  BRIEF ANTIBIOTIC NOTE   Pharmacy has received consult(s) for vancomycin and cefepime from an ED provider.  The patient's profile has been reviewed for ht/wt/allergies/indication/available labs.    One time order(s) placed for cefepime 2 gm and vancomycin 1 gram   Further antibiotics/pharmacy consults should be ordered by admitting physician if indicated.                       Thank you, Dalene Robards A 03/04/2022  7:55 AM

## 2022-03-04 NOTE — ED Notes (Signed)
Pt in ed continues to wait bd assignment following hospital admission. Pt now on 3 L nasal cannula, chronic. Endorses SOB with exertion. Pt IV patent. VS updated. Pt remains on Vs monitor. Call bell within reach. Denies any additional needs.

## 2022-03-04 NOTE — Progress Notes (Signed)
PHARMACIST - PHYSICIAN COMMUNICATION  CONCERNING:  Enoxaparin (Lovenox) for DVT Prophylaxis    RECOMMENDATION: Patient was prescribed enoxaprin '40mg'$  q24 hours for VTE prophylaxis.   Filed Weights   03/04/22 0635  Weight: 45.4 kg (100 lb)    Body mass index is 26 kg/m.  Estimated Creatinine Clearance: 34 mL/min (by C-G formula based on SCr of 0.57 mg/dL).   Patient is candidate for enoxaparin '30mg'$  every 24 hours based on CrCl <35m/min or Weight <45kg  DESCRIPTION: Pharmacy has [facility]arin dose [facility]  Patient is now receiving enoxaparin 30 mg every 24 hours    LVira Blanco PharmD Clinical Pharmacist  03/04/2022 1:40 PM

## 2022-03-04 NOTE — ED Notes (Signed)
Pt's bladder scanned. >999 mL Mal Misty, MD, made aware.

## 2022-03-04 NOTE — Consult Note (Signed)
Pharmacy Antibiotic Note  Kristi Clark is a 71 y.o. female admitted on 03/04/2022 with pneumonia.  Pharmacy has been consulted for cefepime and vancomycin dosing.  Vancomycin 1 gram received 12/29 @ 0841 Cefepime 2 grams received 12/29 @ 0825  Plan: Start Cefepime 2 grams IV every 12 hours Start vancomycin 750 mg IV every 48 hours Estimated AUC 430.6, Cmin 9.1 Vancomycin levels as clinically appropriate Monitor cultures, renal function, and MRSA screen  Height: '4\' 4"'$  (132.1 cm) Weight: 45.4 kg (100 lb) IBW/kg (Calculated) : 27.1  Temp (24hrs), Avg:98.5 F (36.9 C), Min:98.2 F (36.8 C), Max:98.8 F (37.1 C)  Recent Labs  Lab 03/04/22 0639 03/04/22 0827  WBC 12.8*  --   CREATININE 0.57  --   LATICACIDVEN 0.9 0.7    Estimated Creatinine Clearance: 34 mL/min (by C-G formula based on SCr of 0.57 mg/dL).    Allergies  Allergen Reactions   Clindamycin/Lincomycin Hives   Prednisone Other (See Comments)    Psychosis   Amoxicillin Rash   Avelox [Moxifloxacin Hcl In Nacl] Rash   Codeine Sulfate Nausea Only   Penicillins Rash   Sulfa Antibiotics Rash    Antimicrobials this admission: vancomycin 12/29 >>  cefepime 12/29 >>   Dose adjustments this admission: N/A  Microbiology results: 12/29 BCx: pending 12/29 UCx: pending  12/29 MRSA PCR: negative  Thank you for allowing pharmacy to be a part of this patient's care.  Lorin Picket, PharmD 03/04/2022 9:57 AM

## 2022-03-05 DIAGNOSIS — J9621 Acute and chronic respiratory failure with hypoxia: Secondary | ICD-10-CM | POA: Diagnosis not present

## 2022-03-05 DIAGNOSIS — J441 Chronic obstructive pulmonary disease with (acute) exacerbation: Secondary | ICD-10-CM | POA: Diagnosis not present

## 2022-03-05 DIAGNOSIS — Z66 Do not resuscitate: Secondary | ICD-10-CM

## 2022-03-05 DIAGNOSIS — I5032 Chronic diastolic (congestive) heart failure: Secondary | ICD-10-CM

## 2022-03-05 DIAGNOSIS — W19XXXA Unspecified fall, initial encounter: Secondary | ICD-10-CM

## 2022-03-05 DIAGNOSIS — A419 Sepsis, unspecified organism: Secondary | ICD-10-CM | POA: Diagnosis not present

## 2022-03-05 DIAGNOSIS — D649 Anemia, unspecified: Secondary | ICD-10-CM

## 2022-03-05 DIAGNOSIS — W19XXXS Unspecified fall, sequela: Secondary | ICD-10-CM

## 2022-03-05 DIAGNOSIS — I480 Paroxysmal atrial fibrillation: Secondary | ICD-10-CM

## 2022-03-05 DIAGNOSIS — Y92009 Unspecified place in unspecified non-institutional (private) residence as the place of occurrence of the external cause: Secondary | ICD-10-CM

## 2022-03-05 DIAGNOSIS — Z8679 Personal history of other diseases of the circulatory system: Secondary | ICD-10-CM

## 2022-03-05 DIAGNOSIS — I10 Essential (primary) hypertension: Secondary | ICD-10-CM

## 2022-03-05 DIAGNOSIS — J189 Pneumonia, unspecified organism: Secondary | ICD-10-CM | POA: Diagnosis not present

## 2022-03-05 LAB — BASIC METABOLIC PANEL
Anion gap: 8 (ref 5–15)
BUN: 15 mg/dL (ref 8–23)
CO2: 28 mmol/L (ref 22–32)
Calcium: 9.3 mg/dL (ref 8.9–10.3)
Chloride: 100 mmol/L (ref 98–111)
Creatinine, Ser: 0.65 mg/dL (ref 0.44–1.00)
GFR, Estimated: >60 mL/min (ref >60)
Glucose, Bld: 135 mg/dL — ABNORMAL HIGH (ref 70–99)
Potassium: 3.7 mmol/L (ref 3.5–5.1)
Sodium: 136 mmol/L (ref 135–145)

## 2022-03-05 LAB — IRON AND TIBC
Iron: 19 ug/dL — ABNORMAL LOW (ref 28–170)
Saturation Ratios: 8 % — ABNORMAL LOW (ref 10.4–31.8)
TIBC: 238 ug/dL — ABNORMAL LOW (ref 250–450)
UIBC: 219 ug/dL

## 2022-03-05 LAB — CBC
HCT: 24.7 % — ABNORMAL LOW (ref 36.0–46.0)
Hemoglobin: 8 g/dL — ABNORMAL LOW (ref 12.0–15.0)
MCH: 29.4 pg (ref 26.0–34.0)
MCHC: 32.4 g/dL (ref 30.0–36.0)
MCV: 90.8 fL (ref 80.0–100.0)
Platelets: 229 10*3/uL (ref 150–400)
RBC: 2.72 MIL/uL — ABNORMAL LOW (ref 3.87–5.11)
RDW: 18.3 % — ABNORMAL HIGH (ref 11.5–15.5)
WBC: 6 10*3/uL (ref 4.0–10.5)
nRBC: 0 % (ref 0.0–0.2)

## 2022-03-05 LAB — FOLATE: Folate: 30 ng/mL (ref >5.9)

## 2022-03-05 LAB — NOVEL CORONAVIRUS, NAA: SARS-CoV-2, NAA: NOT DETECTED

## 2022-03-05 LAB — RETICULOCYTES
Immature Retic Fract: 22.6 % — ABNORMAL HIGH (ref 2.3–15.9)
RBC.: 2.72 MIL/uL — ABNORMAL LOW (ref 3.87–5.11)
Retic Count, Absolute: 64.2 10*3/uL (ref 19.0–186.0)
Retic Ct Pct: 2.4 % (ref 0.4–3.1)

## 2022-03-05 LAB — STREP PNEUMONIAE URINARY ANTIGEN: Strep Pneumo Urinary Antigen: NEGATIVE

## 2022-03-05 LAB — PROCALCITONIN: Procalcitonin: <0.1 ng/mL

## 2022-03-05 LAB — VITAMIN B12: Vitamin B-12: 423 pg/mL (ref 180–914)

## 2022-03-05 LAB — FERRITIN: Ferritin: 166 ng/mL (ref 11–307)

## 2022-03-05 MED ORDER — ORAL CARE MOUTH RINSE: 15.0000 mL | OROMUCOSAL | Status: DC | PRN

## 2022-03-05 MED ORDER — SENNOSIDES-DOCUSATE SODIUM 8.6-50 MG PO TABS: 1.0000 | ORAL_TABLET | Freq: Two times a day (BID) | ORAL | Status: DC | PRN

## 2022-03-05 MED ORDER — SODIUM CHLORIDE 0.9 % IV SOLN
250.0000 mg | Freq: Once | INTRAVENOUS | Status: AC
  Administered 2022-03-05: 250 mg via INTRAVENOUS
  Filled 2022-03-05: qty 20

## 2022-03-05 NOTE — Evaluation (Signed)
Physical Therapy Evaluation Patient Details Name: Kristi Clark MRN: [id number] DOB: 06/15/1950 Today's Date: 03/05/2022  History of Present Illness  Kristi Clark is a 13yoF who comes to [facility] on 03/04/22 c acute respiratory distress. PMH: ILD, COPD, 3L chronic. Pt admitted with PNA sepsis. At baseline pt is a household AMB on O2, uses a 4WW, is fearful of falling.  Clinical Impression  Pt still in ED, HOB elevated on gurney. Saturations and flowrate at baseline, no dysnea while seated, but WOB remains mildly increaed. Pt demonstrates bed mobility, transfers, and AMB without physical assistance, but author maintains increased flow rate to 5L/min for comfort and maintaining sats >89%. Pt has no acute weakness, but DOE arrests any AMB farther than in-room distances, 2 minutes seated recovery. Pt will need additional support for exerting tasks at home, but is safe to mobilize without physical assist at present. Will continue to follow. Pt was already active with HHPT, HHOT PTA.      Recommendations for follow up therapy are one component of a multi-disciplinary discharge planning process, led by the attending physician.  Recommendations may be updated based on patient status, additional functional criteria and insurance authorization.  Follow Up Recommendations Home health PT      Assistance Recommended at Discharge Intermittent Supervision/Assistance  Patient can return home with the following  A little help with walking and/or transfers;A little help with bathing/dressing/bathroom;Assistance with cooking/housework;Help with stairs or ramp for entrance;Assist for transportation;Direct supervision/assist for medications management;Direct supervision/assist for financial management    Equipment Recommendations None recommended by PT  Recommendations for Other Services       Functional Status Assessment Patient has had a recent decline in their functional status and demonstrates the  ability to make significant improvements in function in a reasonable and predictable amount of time.     Precautions / Restrictions Precautions Precautions: Fall Precaution Comments: watch spo2 Restrictions Weight Bearing Restrictions: No      Mobility  Bed Mobility Overal bed mobility: Needs Assistance Bed Mobility: Supine to Sit, Sit to Supine     Supine to sit: Supervision Sit to supine: Supervision   General bed mobility comments: off ED gurney    Transfers Overall transfer level: Needs assistance   Transfers: Sit to/from Stand Sit to Stand: From elevated surface, Supervision, Min guard           General transfer comment: ED gurney, feet on 3" step    Ambulation/Gait Ambulation/Gait assistance: Supervision, Min guard Gait Distance (Feet): 16 Feet Assistive device: Rolling walker (2 wheels) Gait Pattern/deviations: Step-to pattern       General Gait Details: alternates fwd/backward, O2 adjsuted to 5L/min; no desat <90%, but DOE arrests activity at 32f, requires seated recovery.  Stairs            Wheelchair Mobility    Modified Rankin (Stroke Patients Only)       Balance Overall balance assessment: History of Falls, Mild deficits observed, not formally tested                                           Pertinent Vitals/Pain Pain Assessment Pain Assessment: No/denies pain    Home Living Family/patient expects to be discharged to:: Private residence Living Arrangements: Other relatives (grandson, niece) Available Help at Discharge: Family;Available PRN/intermittently Type of Home: House Home Access: Ramped entrance       Home Layout: One  level Home Equipment: Shower seat;Grab bars - tub/shower;Hospital bed;BSC/3in1;Rollator (4 wheels)      Prior Function Prior Level of Function : Needs assist             Mobility Comments: uses rollator at baseline, pt reports recent fall at home (was not using AD) ADLs  Comments: has assist for ADL/IADs since recent admissions     Hand Dominance   Dominant Hand: Right    Extremity/Trunk Assessment   Upper Extremity Assessment Upper Extremity Assessment: Overall WFL for tasks assessed    Lower Extremity Assessment Lower Extremity Assessment: Overall WFL for tasks assessed       Communication      Cognition Arousal/Alertness: Awake/alert Behavior During Therapy: WFL for tasks assessed/performed Overall Cognitive Status: Within Functional Limits for tasks assessed Area of Impairment:  (worried about condition, hopelessness of long term prognosis, worried about burdening family)                                        General Comments General comments (skin integrity, edema, etc.): spo2 down to 84% on 3 L via [place] after donning socks; up to >90% on 3 L via [place] after approx 30 seconds, spo2 >90% on 4 L via [place] during mobility/toileting;    Exercises Other Exercises Other Exercises: STS from gurney 3x, feet on step   Assessment/Plan    PT Assessment Patient needs continued PT services  PT Problem List Cardiopulmonary status limiting activity;Decreased activity tolerance       PT Treatment Interventions DME instruction;Gait training;Stair training;Functional mobility training;Therapeutic activities;Therapeutic exercise;Patient/family education    PT Goals (Current goals can be found in the Care Plan section)  Acute Rehab PT Goals Patient Stated Goal: recover her activity tolerance PT Goal Formulation: With patient Time For Goal Achievement: 03/19/22 Potential to Achieve Goals: Good    Frequency Min 2X/week     Co-evaluation               AM-PAC PT "6 Clicks" Mobility  Outcome Measure Help needed turning from your back to your side while in a flat bed without using bedrails?: A Little Help needed moving from lying on your back to sitting on the side of a flat bed without using bedrails?: A Little Help needed  moving to and from a bed to a chair (including a wheelchair)?: A Little Help needed standing up from a chair using your arms (e.g., wheelchair or bedside chair)?: A Little Help needed to walk in hospital room?: A Little Help needed climbing 3-5 steps with a railing? : A Little 6 Click Score: 18    End of Session Equipment Utilized During Treatment: Oxygen Activity Tolerance: Patient tolerated treatment well;Patient limited by fatigue Patient left: in bed;with call bell/phone within reach Nurse Communication: Mobility status PT Visit Diagnosis: Other abnormalities of gait and mobility (R26.89)    Time: 7673-4193 PT Time Calculation (min) (ACUTE ONLY): 28 min   Charges:   PT Evaluation $PT Eval Moderate Complexity: 1 Mod PT Treatments $Therapeutic Activity: 8-22 mins       1:53 PM, 03/05/22 Etta Grandchild, PT, DPT Physical Therapist - [facility]  [phone] ([place])    Tiajah Oyster C 03/05/2022, 1:51 PM

## 2022-03-05 NOTE — Progress Notes (Signed)
PROGRESS NOTE  Kristi Clark GGY:[id number] DOB: [birth year]   PCP: [name], DO  Patient is from: Home.  Lives with niece.  DOA: 03/04/2022 LOS: 1  Chief complaints Chief Complaint  Patient presents with   Respiratory Distress     Brief Narrative / Interim history: 71 year old F with PMH of COPD/chronic hypoxic RF on 3 L, recent hospitalization for COPD exacerbation and respiratory failure, ILD, paroxysmal A-fib on Eliquis, CAD, HTN, HLD, GERD, depression and prior tobacco use presenting with increased shortness of breath, dry cough and fever for 2 days.  Reportedly febrile to 101 at home.  Hypoxic to 70% on 3 L when EMS arrived.  Hypoxic to 89% on 6 L in ED.  Tachypneic to 20s and 30s.  WBC 12.8.  Lactic acid and Pro-Cal negative.  CXR with chronic changes in the lungs and emphysema with superimposed acute multilobar bronchopneumonia based on progressive change since her x-ray a day prior.  Patient admitted for severe sepsis with acute on chronic respiratory failure with hypoxia in the setting of bilateral pneumonia.  Started on vancomycin and cefepime and transition to cefepime and doxycycline on admission.  Subjective: No major events overnight of this morning.  Continues to endorse shortness of breath and dry cough.  Denies chest pain.  Also concerned about constipation although she had a bowel movement with enema yesterday.  Some concern about urinary retention with bladder scan showing a liter overnight although only 500 cc UOP charted from overnight.  She says she quit smoking last month.  Objective: Vitals:   03/05/22 0515 03/05/22 0545 03/05/22 0600 03/05/22 0952  BP:   (!) 136/45 (!) 147/51  Pulse: 69 69 66   Resp: '15 17 16   '$ Temp:      TempSrc:      SpO2:      Weight:      Height:        Examination:  GENERAL: Appears frail.  No apparent distress. HEENT: MMM.  Vision and hearing grossly intact.  NECK: Supple.  No apparent JVD.  RESP:  No IWOB.  Fair  aeration bilaterally. CVS:  RRR. Heart sounds normal.  ABD/GI/GU: BS+. Abd soft, NTND.  MSK/EXT:  Moves extremities.  Bruising in the back.  No tenderness over spinous process. SKIN: no apparent skin lesion or wound NEURO: Awake, alert and oriented appropriately.  No apparent focal neuro deficit. PSYCH: Calm. Normal affect.   Procedures:  None  Microbiology summarized: [id number] and influenza PCR nonreactive. MRSA PCR screen negative. Blood cultures NGTD  Assessment and plan: Principal Problem:   Acute on chronic respiratory failure with hypoxemia (HCC) Active Problems:   Severe sepsis (HCC)   COPD with acute exacerbation (HCC)   Essential hypertension   Bilateral pneumonia   Normocytic anemia   Paroxysmal A-fib (HCC)   Fall at home, initial encounter   History of CAD (coronary artery disease)   Chronic diastolic CHF (congestive heart failure) ([place])   DNR (do not resuscitate)  Severe sepsis due to bilateral pneumonia: POA.  Tachypnea, leukocytosis with respiratory failure.  CXR concerning for multilobar pneumonia.  COVID-19, influenza, RSV, MRSA PCR screen and blood cultures negative so far.  Has history of Pseudomonas infection.  Sepsis physiology resolving. -Continue IV cefepime and doxycycline given recent hospitalization -Incentive spirometry, flutter valve, OOB  Acute on chronic respiratory failure with hypoxemia due to pneumonia, COPD exacerbation and possible ILD: Desaturated to 89% on 6 L by [place] while in ED.  Now saturating in 90s  on home 3 L at rest.  Continues to endorse shortness of breath and dry cough.  -Antibiotics as above -Continue IV Solu-Medrol, scheduled and as needed nebulizers. -Pulmonary toilet as above  Chronic diastolic CHF: TTE in 56/8127 with LVEF of 55 to 60% and G2 DD.  Appears euvolemic on exam. -Continue home Lasix -Hold HCTZ -Strict intake and output, renal functions and electrolytes.  Essential hypertension: BP improved. -Continue home  medications  Paroxysmal A-fib: In sinus rhythm.  Does not seem to be on [facility] likely due to anemia. -Continue home amiodarone  History of CAD: No chest pain or anginal symptoms.  Serial troponin and EKG negative. -Continue home meds  Recent fall at home: Has some bruise over her back but no tenderness over spinous process.  No neurologic deficit. -PT/OT -Fall precaution  Underlying interstitial lung disease, questionable MAI on chest x-ray: Outpatient follow-up with pulmonologist  -Outpatient follow-up with pulmonology  Normocytic anemia: Patient denies GI bleed, melena or hematochezia.  It seems she had 1 unit previous hospitalization with good response.  GI was consulted but family decided to hold off EGD and colonoscopy at that time.  She was discharged on PPI twice daily.  Some drop in Hgb could also be dilutional from IV fluid. Recent Labs    01/16/22 0458 01/17/22 0416 01/18/22 0408 01/18/22 2007 01/19/22 0529 01/20/22 0532 01/21/22 5170 02/02/22 1401 03/04/22 0639 03/05/22 0442  HGB 7.2* 7.1* 7.1* 9.8* 10.8* 9.4* 10.2* 11.4 8.0* 8.0*  -P.o. Protonix twice daily -May have to hold Plavix if H&H continues to drop. -Check anemia panel  Urinary retention: Bladder scan showed about 1 L overnight but only 500 UOP charted from overnight. -Strict intake and output -Bladder scan every 6 hours  Possible constipation -Continue MiraLAX.  Goal of care: DNR/DNI.  Previously evaluated by palliative medicine.  Body mass index is 26 kg/m.          DVT prophylaxis:  enoxaparin (LOVENOX) injection 30 mg Start: 03/04/22 2200 SCDs Start: 03/04/22 0920  Code Status: DNR/DNI Family Communication: None at bedside Level of care: Med-Surg Status is: Inpatient Remains inpatient appropriate because: Due to sepsis in the setting of pneumonia and acute on chronic respiratory failure   Final disposition: TBD Consultants:  None  Sch Meds:  Scheduled Meds:  amiodarone  200 mg Oral  Daily   amLODipine  2.5 mg Oral Daily   atorvastatin  40 mg Oral Daily   clopidogrel  75 mg Oral Daily   enoxaparin (LOVENOX) injection  30 mg Subcutaneous Q24H   fluticasone furoate-vilanterol  1 puff Inhalation Daily   furosemide  20 mg Oral Daily   ipratropium-albuterol  3 mL Nebulization Q6H   isosorbide mononitrate  60 mg Oral BID   losartan  25 mg Oral Daily   methylPREDNISolone (SOLU-MEDROL) injection  125 mg Intravenous Q12H   metoprolol succinate  50 mg Oral Daily   mirtazapine  45 mg Oral QHS   nicotine  14 mg Transdermal Daily   pantoprazole  40 mg Oral BID   polyethylene glycol  17 g Oral TID   QUEtiapine  50 mg Oral QHS   triamcinolone acetonide  40 mg Intramuscular Once   Continuous Infusions:  ceFEPime (MAXIPIME) IV Stopped (03/05/22 0837)   doxycycline (VIBRAMYCIN) IV Stopped (03/05/22 0342)   ferric gluconate (FERRLECIT) IVPB     PRN Meds:.acetaminophen **OR** acetaminophen, ondansetron **OR** ondansetron (ZOFRAN) IV  Antimicrobials: Anti-infectives (From admission, onward)    Start     Dose/Rate Route Frequency  Ordered Stop   03/06/22 1000  vancomycin (VANCOREADY) IVPB 750 mg/150 mL  Status:  Discontinued        750 mg 150 mL/hr over 60 Minutes Intravenous Every 48 hours 03/04/22 0956 03/04/22 1307   03/04/22 2000  ceFEPIme (MAXIPIME) 2 g in sodium chloride 0.9 % 100 mL IVPB        2 g 200 mL/hr over 30 Minutes Intravenous Every 12 hours 03/04/22 0956 03/09/22 1959   03/04/22 1400  doxycycline (VIBRAMYCIN) 100 mg in sodium chloride 0.9 % 250 mL IVPB        100 mg 125 mL/hr over 120 Minutes Intravenous Every 12 hours 03/04/22 1305 03/09/22 1359   03/04/22 0800  ceFEPIme (MAXIPIME) 2 g in sodium chloride 0.9 % 100 mL IVPB        2 g 200 mL/hr over 30 Minutes Intravenous STAT 03/04/22 0748 03/04/22 0901   03/04/22 0800  vancomycin (VANCOCIN) IVPB 1000 mg/200 mL premix        1,000 mg 200 mL/hr over 60 Minutes Intravenous STAT 03/04/22 0750 03/04/22 1306         I have personally reviewed the following labs and images: CBC: Recent Labs  Lab 03/04/22 0639 03/05/22 0442  WBC 12.8* 6.0  NEUTROABS 8.2*  --   HGB 8.0* 8.0*  HCT 24.7* 24.7*  MCV 92.2 90.8  PLT 202 229   BMP &GFR Recent Labs  Lab 03/04/22 0639 03/05/22 0442  NA 133* 136  K 3.5 3.7  CL 98 100  CO2 25 28  GLUCOSE 128* 135*  BUN 12 15  CREATININE 0.57 0.65  CALCIUM 8.7* 9.3   Estimated Creatinine Clearance: 34 mL/min (by C-G formula based on SCr of 0.65 mg/dL). Liver & Pancreas: Recent Labs  Lab 03/04/22 0639  AST 29  ALT 23  ALKPHOS 67  BILITOT 0.7  PROT 6.1*  ALBUMIN 3.0*   Recent Labs  Lab 03/04/22 0639  LIPASE 34   No results for input(s): "AMMONIA" in the last 168 hours. Diabetic: No results for input(s): "HGBA1C" in the last 72 hours. No results for input(s): "GLUCAP" in the last 168 hours. Cardiac Enzymes: No results for input(s): "CKTOTAL", "CKMB", "CKMBINDEX", "TROPONINI" in the last 168 hours. No results for input(s): "PROBNP" in the last 8760 hours. Coagulation Profile: Recent Labs  Lab 03/04/22 0639  INR 1.0   Thyroid Function Tests: No results for input(s): "TSH", "T4TOTAL", "FREET4", "T3FREE", "THYROIDAB" in the last 72 hours. Lipid Profile: No results for input(s): "CHOL", "HDL", "LDLCALC", "TRIG", "CHOLHDL", "LDLDIRECT" in the last 72 hours. Anemia Panel: Recent Labs    03/05/22 0442  FOLATE 30.0  FERRITIN 166  TIBC 238*  IRON 19*  RETICCTPCT 2.4   Urine analysis:    Component Value Date/Time   COLORURINE STRAW (A) 03/04/2022 2010   APPEARANCEUR CLEAR (A) 03/04/2022 2010   APPEARANCEUR Clear 12/27/2021 1316   LABSPEC 1.006 03/04/2022 2010   PHURINE 6.0 03/04/2022 2010   GLUCOSEU NEGATIVE 03/04/2022 2010   [place] NEGATIVE 03/04/2022 2010   [place] NEGATIVE 03/04/2022 2010   BILIRUBINUR Negative 12/27/2021 1316   [place] NEGATIVE 03/04/2022 2010   PROTEINUR 30 (A) 03/04/2022 2010   NITRITE NEGATIVE  03/04/2022 2010   LEUKOCYTESUR NEGATIVE 03/04/2022 2010   Sepsis Labs: Invalid input(s): "PROCALCITONIN", "LACTICIDVEN"  Microbiology: Recent Results (from the past 240 hour(s))  Novel Coronavirus, NAA ([facility])     Status: None   Collection Time: 03/03/22  2:42 PM   Specimen: Saline  Result Value  Ref Range Status   SARS-CoV-2, NAA Not Detected Not Detected Final    Comment: This nucleic acid amplification test was developed and its performance characteristics determined by [employer]. Nucleic acid amplification tests include RT-PCR and TMA. This test has not been FDA cleared or approved. This test has been authorized by FDA under an Emergency Use Authorization (EUA). This test is only authorized for the duration of time the declaration that circumstances exist justifying the authorization of the emergency use of in vitro diagnostic tests for detection of SARS-CoV-2 virus and/or diagnosis of COVID-19 infection under section 564(b)(1) of the Act, 21 U.S.C. 528UXL-2(G) (1), unless the authorization is terminated or revoked sooner. When diagnostic testing is negative, the possibility of a false negative result should be considered in the context of a patient's recent exposures and the presence of clinical signs and symptoms consistent with COVID-19. An individual without symptoms of COVID-19 and who is not shedding SARS-CoV-2 virus wo uld expect to have a negative (not detected) result in this assay.   Resp panel by RT-PCR (RSV, Flu A&B, Covid) Anterior Nasal Swab     Status: None   Collection Time: 03/04/22  6:39 AM   Specimen: Anterior Nasal Swab  Result Value Ref Range Status   SARS Coronavirus 2 by RT PCR NEGATIVE NEGATIVE Final    Comment: (NOTE) SARS-CoV-2 target nucleic acids are NOT DETECTED.  The SARS-CoV-2 RNA is generally detectable in upper respiratory specimens during the acute phase of infection. The lowest concentration of SARS-CoV-2 viral copies this  assay can detect is 138 copies/mL. A negative result does not preclude SARS-Cov-2 infection and should not be used as the sole basis for treatment or other patient management decisions. A negative result may occur with  improper specimen collection/handling, submission of specimen other than nasopharyngeal swab, presence of viral mutation(s) within the areas targeted by this assay, and inadequate number of viral copies(<138 copies/mL). A negative result must be combined with clinical observations, patient history, and epidemiological information. The expected result is Negative.  Fact Sheet for Patients:  EntrepreneurPulse.com.au  Fact Sheet for Healthcare Providers:  IncredibleEmployment.be  This test is no t yet approved or cleared by the [place] FDA and  has been authorized for detection and/or diagnosis of SARS-CoV-2 by FDA under an Emergency Use Authorization (EUA). This EUA will remain  in effect (meaning this test can be used) for the duration of the COVID-19 declaration under Section 564(b)(1) of the Act, 21 U.S.C.section 360bbb-3(b)(1), unless the authorization is terminated  or revoked sooner.       Influenza A by PCR NEGATIVE NEGATIVE Final   Influenza B by PCR NEGATIVE NEGATIVE Final    Comment: (NOTE) The Xpert Xpress SARS-CoV-2/FLU/RSV plus assay is intended as an aid in the diagnosis of influenza from Nasopharyngeal swab specimens and should not be used as a sole basis for treatment. Nasal washings and aspirates are unacceptable for Xpert Xpress SARS-CoV-2/FLU/RSV testing.  Fact Sheet for Patients: EntrepreneurPulse.com.au  Fact Sheet for Healthcare Providers: IncredibleEmployment.be  This test is not yet approved or cleared by the [place] FDA and has been authorized for detection and/or diagnosis of SARS-CoV-2 by FDA under an Emergency Use Authorization (EUA). This EUA will  remain in effect (meaning this test can be used) for the duration of the COVID-19 declaration under Section 564(b)(1) of the Act, 21 U.S.C. section 360bbb-3(b)(1), unless the authorization is terminated or revoked.     Resp Syncytial Virus by PCR NEGATIVE NEGATIVE Final  Comment: (NOTE) Fact Sheet for Patients: EntrepreneurPulse.com.au  Fact Sheet for Healthcare Providers: IncredibleEmployment.be  This test is not yet approved or cleared by the [place] FDA and has been authorized for detection and/or diagnosis of SARS-CoV-2 by FDA under an Emergency Use Authorization (EUA). This EUA will remain in effect (meaning this test can be used) for the duration of the COVID-19 declaration under Section 564(b)(1) of the Act, 21 U.S.C. section 360bbb-3(b)(1), unless the authorization is terminated or revoked.  Performed at [facility], [address] [place], [place] [address]   Blood culture (routine single)     Status: None (Preliminary result)   Collection Time: 03/04/22  6:59 AM   Specimen: Right Antecubital; Blood  Result Value Ref Range Status   Specimen Description RIGHT ANTECUBITAL  Final   Special Requests   Final    BOTTLES DRAWN AEROBIC AND ANAEROBIC Blood Culture results may not be optimal due to an excessive volume of blood received in culture bottles   Culture   Final    NO GROWTH < 24 HOURS Performed at [facility], [address], [place], [place] [address]    Report Status PENDING  Incomplete  Culture, blood (single)     Status: None (Preliminary result)   Collection Time: 03/04/22  7:35 AM   Specimen: BLOOD  Result Value Ref Range Status   Specimen Description BLOOD  RIGHT FOREARM  Final   Special Requests   Final    BOTTLES DRAWN AEROBIC AND ANAEROBIC Blood Culture adequate volume   Culture   Final    NO GROWTH < 24 HOURS Performed at [facility], [address] [place], [place]  [address]    Report Status PENDING  Incomplete  MRSA Next Gen by PCR, Nasal     Status: None   Collection Time: 03/04/22  9:20 AM   Specimen: Nasal Mucosa; Nasal Swab  Result Value Ref Range Status   MRSA by PCR Next Gen NOT DETECTED NOT DETECTED Final    Comment: (NOTE) The GeneXpert MRSA Assay (FDA approved for NASAL specimens only), is one component of a comprehensive MRSA colonization surveillance program. It is not intended to diagnose MRSA infection nor to guide or monitor treatment for MRSA infections. Test performance is not FDA approved in patients less than 25 years old. Performed at [facility], [address], [place], [place] [address]     Radiology Studies: No results found.    Winry Egnew T. [place]  If 7PM-7AM, please contact night-coverage [email or web address] 03/05/2022, 12:06 PM

## 2022-03-05 NOTE — Plan of Care (Signed)
°  Problem: Education: °Goal: Knowledge of disease or condition will improve °Outcome: Progressing °Goal: Knowledge of the prescribed therapeutic regimen will improve °Outcome: Progressing °Goal: Individualized Educational Video(s) °Outcome: Progressing °  °

## 2022-03-05 NOTE — Evaluation (Signed)
Occupational Therapy Evaluation Patient Details Name: Kristi Clark MRN: [id number] DOB: [birth year] Today's Date: 03/05/2022   History of Present Illness Pt is a 71 year old female presenting to the ED with SOB, recent fall at home; Admitted with COPD exacerbation, sepsis due to pneumonia, acute on chronic hypoxic respiratory failure; PMH significant for e COPD, recent discharge from the hospital on 01/21/2022 for COPD exacerbation with acute respiratory failure, chronic hypoxic respiratory failure on 3 L/min oxygen, tobacco use disorder, CAD, interstitial lung disease, hypertension, hyperlipidemia, depression, GERD   Clinical Impression   Chart reviewed, nurse cleared pt for participation in OT evaluation. Pt is alert and oriented x4, agreeable to evaluation. Appropriate awareness of deficits, endorses feeling anxious regarding SOB. PTA pt reports she was requiring assist for ADLs (niece getting up with her at night to use bathroom), has assist for IADLs. Pt presents with deficits in endurance, activity tolerance affecting safe and optimal ADL completion. Pt reports she wants to return home. Recommend HHOT following discharge. OT will continue to follow acutely.    Of note: pt with SOB, spo2 to 84% on 3 L via [place] after donning socks, >90% on 3 L via [place] after PLB and approx 30 seconds. Pt >90% on 4 L via [place] during toileting, mobility. HR up to 117 with toileting.    Recommendations for follow up therapy are one component of a multi-disciplinary discharge planning process, led by the attending physician.  Recommendations may be updated based on patient status, additional functional criteria and insurance authorization.   Follow Up Recommendations  Home health OT     Assistance Recommended at Discharge Intermittent Supervision/Assistance  Patient can return home with the following A little help with bathing/dressing/bathroom    Functional Status Assessment  Patient has had a recent decline  in their functional status and demonstrates the ability to make significant improvements in function in a reasonable and predictable amount of time.  Equipment Recommendations   (consider power scooter/device on an outpatient basis for community mobility as needed)    Recommendations for Other Services       Precautions / Restrictions Precautions Precautions: Fall Precaution Comments: watch spo2 Restrictions Weight Bearing Restrictions: No      Mobility Bed Mobility Overal bed mobility: Modified Independent             General bed mobility comments: off ED strecher    Transfers Overall transfer level: Needs assistance Equipment used: Rolling walker (2 wheels) Transfers: Sit to/from Stand Sit to Stand: Modified independent (Device/Increase time), From elevated surface                  Balance Overall balance assessment: Needs assistance Sitting-balance support: Feet supported Sitting balance-Leahy Scale: Good     Standing balance support: Bilateral upper extremity supported, During functional activity Standing balance-Leahy Scale: Good                             ADL either performed or assessed with clinical judgement   ADL Overall ADL's : Needs assistance/impaired Eating/Feeding: Set up;Sitting   Grooming: Wash/dry hands;Sitting;Standing;Supervision/safety           Upper Body Dressing : Set up;Sitting   Lower Body Dressing: Set up;Sitting/lateral leans Lower Body Dressing Details (indicate cue type and reason): socks Toilet Transfer: Supervision/safety;Min guard;Rolling walker (2 wheels);Ambulation Toilet Transfer Details (indicate cue type and reason): vcs for PLB Toileting- Clothing Manipulation and Hygiene: Minimal assistance;Sit to/from stand  Functional mobility during ADLs: Supervision/safety;Min guard;Rolling walker (2 wheels) (approx 20' in room with RW, vcs for ec techniques)       Vision Baseline Vision/History:  1 Wears glasses Patient Visual Report: No change from baseline       Perception     Praxis      Pertinent Vitals/Pain Pain Assessment Pain Assessment: Faces Faces Pain Scale: Hurts a little bit Pain Location: genealized Pain Descriptors / Indicators: Discomfort Pain Intervention(s): Monitored during session, Limited activity within patient's tolerance     Hand Dominance     Extremity/Trunk Assessment Upper Extremity Assessment Upper Extremity Assessment: Generalized weakness   Lower Extremity Assessment Lower Extremity Assessment: Generalized weakness       Communication Communication Communication: No difficulties   Cognition Arousal/Alertness: Awake/alert Behavior During Therapy: WFL for tasks assessed/performed, Anxious Overall Cognitive Status: Within Functional Limits for tasks assessed                                 General Comments: appropriate safey awareness, carry over of ec techniques learned; reports anxiety regarding SOB     General Comments  spo2 down to 84% on 3 L via [place] after donning socks; up to >90% on 3 L via [place] after approx 30 seconds, spo2 >90% on 4 L via [place] during mobility/toileting;    Exercises Other Exercises Other Exercises: edu re: dishcarge recommendations, energy conservation techniques   Shoulder Instructions      Home Living Family/patient expects to be discharged to:: Private residence Living Arrangements:  (grandson and neice) Available Help at Discharge: Family;Available PRN/intermittently Type of Home: House Home Access: Ramped entrance     Home Layout: One level     Bathroom Shower/Tub: Walk-in shower         Home Equipment: Shower seat;Grab bars - tub/shower;Hospital bed;BSC/3in1;Rollator (4 wheels)          Prior Functioning/Environment Prior Level of Function : Needs assist             Mobility Comments: uses rollator at baseline, pt reports recent fall at home (was not using AD) ADLs  Comments: has assist for ADLs/IADLs since recent admissions        OT Problem List: Decreased activity tolerance;Decreased knowledge of use of DME or AE;Cardiopulmonary status limiting activity      OT Treatment/Interventions: Self-care/ADL training;DME and/or AE instruction;Therapeutic activities;Therapeutic exercise;Energy conservation;Patient/family education    OT Goals(Current goals can be found in the care plan section) Acute Rehab OT Goals Patient Stated Goal: return home OT Goal Formulation: With patient Time For Goal Achievement: 03/19/22 Potential to Achieve Goals: Good ADL Goals Pt Will Perform Grooming: with modified independence;standing;sitting Pt Will Perform Lower Body Dressing: with modified independence;sit to/from stand Pt Will Transfer to Toilet: with modified independence;ambulating Pt Will Perform Toileting - Clothing Manipulation and hygiene: with modified independence;sit to/from stand  OT Frequency: Min 2X/week    Co-evaluation              AM-PAC OT "6 Clicks" Daily Activity     Outcome Measure Help from another person eating meals?: None Help from another person taking care of personal grooming?: None Help from another person toileting, which includes using toliet, bedpan, or urinal?: None Help from another person bathing (including washing, rinsing, drying)?: A Little Help from another person to put on and taking off regular upper body clothing?: None Help from another person to put on and taking  off regular lower body clothing?: None 6 Click Score: 23   End of Session Equipment Utilized During Treatment: Rolling walker (2 wheels);Oxygen Nurse Communication: Mobility status  Activity Tolerance: Patient tolerated treatment well Patient left: in bed;with call bell/phone within reach  OT Visit Diagnosis: Unsteadiness on feet (R26.81);History of falling (Z91.81)                Time: 0051-1021 OT Time Calculation (min): 28 min Charges:  OT  General Charges $OT Visit: 1 Visit OT Evaluation $OT Eval Low Complexity: 1 Low  Shanon Payor, OTD OTR/L  03/05/22, 10:31 AM

## 2022-03-06 DIAGNOSIS — J9621 Acute and chronic respiratory failure with hypoxia: Secondary | ICD-10-CM | POA: Diagnosis not present

## 2022-03-06 DIAGNOSIS — I5032 Chronic diastolic (congestive) heart failure: Secondary | ICD-10-CM | POA: Diagnosis not present

## 2022-03-06 DIAGNOSIS — J441 Chronic obstructive pulmonary disease with (acute) exacerbation: Secondary | ICD-10-CM | POA: Diagnosis not present

## 2022-03-06 DIAGNOSIS — J189 Pneumonia, unspecified organism: Secondary | ICD-10-CM | POA: Diagnosis not present

## 2022-03-06 LAB — CBC
HCT: 23.1 % — ABNORMAL LOW (ref 36.0–46.0)
Hemoglobin: 7.5 g/dL — ABNORMAL LOW (ref 12.0–15.0)
MCH: 29.4 pg (ref 26.0–34.0)
MCHC: 32.5 g/dL (ref 30.0–36.0)
MCV: 90.6 fL (ref 80.0–100.0)
Platelets: 233 10*3/uL (ref 150–400)
RBC: 2.55 MIL/uL — ABNORMAL LOW (ref 3.87–5.11)
RDW: 18.4 % — ABNORMAL HIGH (ref 11.5–15.5)
WBC: 7.6 10*3/uL (ref 4.0–10.5)
nRBC: 0 % (ref 0.0–0.2)

## 2022-03-06 LAB — RENAL FUNCTION PANEL
Albumin: 2.8 g/dL — ABNORMAL LOW (ref 3.5–5.0)
Anion gap: 10 (ref 5–15)
BUN: 18 mg/dL (ref 8–23)
CO2: 26 mmol/L (ref 22–32)
Calcium: 8.9 mg/dL (ref 8.9–10.3)
Chloride: 101 mmol/L (ref 98–111)
Creatinine, Ser: 0.65 mg/dL (ref 0.44–1.00)
GFR, Estimated: >60 mL/min (ref >60)
Glucose, Bld: 151 mg/dL — ABNORMAL HIGH (ref 70–99)
Phosphorus: 2.9 mg/dL (ref 2.5–4.6)
Potassium: 3.5 mmol/L (ref 3.5–5.1)
Sodium: 137 mmol/L (ref 135–145)

## 2022-03-06 LAB — URINE CULTURE: Culture: NO GROWTH

## 2022-03-06 LAB — MAGNESIUM: Magnesium: 2.3 mg/dL (ref 1.7–2.4)

## 2022-03-06 LAB — PROCALCITONIN: Procalcitonin: <0.1 ng/mL

## 2022-03-06 MED ORDER — METHYLPREDNISOLONE SODIUM SUCC 125 MG IJ SOLR
60.0000 mg | Freq: Two times a day (BID) | INTRAMUSCULAR | Status: AC
  Administered 2022-03-06 – 2022-03-07 (×3): 60 mg via INTRAVENOUS
  Filled 2022-03-06 (×3): qty 2

## 2022-03-06 MED ORDER — ENOXAPARIN SODIUM 40 MG/0.4ML IJ SOSY
40.0000 mg | PREFILLED_SYRINGE | INTRAMUSCULAR | Status: DC
  Administered 2022-03-06 – 2022-03-07 (×2): 40 mg via SUBCUTANEOUS
  Filled 2022-03-06 (×2): qty 0.4

## 2022-03-06 MED ORDER — DOXYCYCLINE HYCLATE 100 MG PO TABS
100.0000 mg | ORAL_TABLET | Freq: Two times a day (BID) | ORAL | Status: DC
  Administered 2022-03-07 – 2022-03-08 (×3): 100 mg via ORAL
  Filled 2022-03-06 (×3): qty 1

## 2022-03-06 NOTE — Progress Notes (Signed)
PHARMACIST - PHYSICIAN COMMUNICATION  CONCERNING: Antibiotic IV to Oral Route Change Policy  RECOMMENDATION: This patient is receiving doxycycline by the intravenous route.  Based on criteria approved by the Pharmacy and Therapeutics Committee, the antibiotic(s) is/are being converted to the equivalent oral dose form(s).   DESCRIPTION: These criteria include: Patient being treated for a respiratory tract infection, urinary tract infection, cellulitis or clostridium difficile associated diarrhea if on metronidazole The patient is not neutropenic and does not exhibit a GI malabsorption state The patient is eating (either orally or via tube) and/or has been taking other orally administered medications for a least 24 hours The patient is improving clinically and has a Tmax < 100.5  If you have questions about this conversion, please contact the [place] 03/06/22

## 2022-03-06 NOTE — Progress Notes (Signed)
PHARMACIST - PHYSICIAN COMMUNICATION  CONCERNING:  Enoxaparin (Lovenox) for DVT Prophylaxis    RECOMMENDATION: Patient was prescribed enoxaparin '30mg'$  q24 hours for VTE prophylaxis.   Filed Weights   03/04/22 0635 03/06/22 0500  Weight: 45.4 kg (100 lb) 48.9 kg (107 lb 12.9 oz)    Body mass index is 28.03 kg/m.  Estimated Creatinine Clearance: 35.4 mL/min (by C-G formula based on SCr of 0.65 mg/dL).  Patient is candidate for enoxaparin '40mg'$  every 24 hours based on CrCl >44m/min and Weight >45kg  DESCRIPTION: [facility]  Patient is now receiving enoxaparin 40 mg every 24 hours   Note worsening anemia; continue to monitor  ABenita Gutter12/31/2023 9:17 AM

## 2022-03-06 NOTE — Progress Notes (Signed)
PROGRESS NOTE  Kristi Clark VOZ:[id number] DOB: 10-Aug-1950   PCP: [name], DO  Patient is from: Home.  Lives with niece.  DOA: 03/04/2022 LOS: 2  Chief complaints Chief Complaint  Patient presents with   Respiratory Distress     Brief Narrative / Interim history: 71 year old F with PMH of COPD/chronic hypoxic RF on 3 L, recent hospitalization for COPD exacerbation and respiratory failure, ILD, paroxysmal A-fib on Eliquis, CAD, HTN, HLD, GERD, depression and prior tobacco use presenting with increased shortness of breath, dry cough and fever for 2 days.  Reportedly febrile to 101 at home.  Hypoxic to 70% on 3 L when EMS arrived.  Hypoxic to 89% on 6 L in ED.  Tachypneic to 20s and 30s.  WBC 12.8.  Lactic acid and Pro-Cal negative.  CXR with chronic changes in the lungs and emphysema with superimposed acute multilobar bronchopneumonia based on progressive change since her x-ray a day prior.  Patient admitted for severe sepsis with acute on chronic respiratory failure with hypoxia in the setting of bilateral pneumonia.  Started on vancomycin and cefepime and transition to cefepime and doxycycline on admission.  Also on IV steroids.    Subjective: Pt seen with son-in-law at bedside.  She is quite anxious and family report increased anxiety and ?steroid side effects as pt has history of steroid-induced psychosis.  Pt reports being tearful a short while ago, feels anxious because she feels she cannot breath.  Says phlegm starting to come up.     Objective: Vitals:   03/06/22 0415 03/06/22 0500 03/06/22 0726 03/06/22 0904  BP:   (!) 170/61   Pulse:   90 89  Resp:   17 16  Temp:   98 F (36.7 C)   TempSrc:      SpO2: 93%  96% 91%  Weight:  48.9 kg    Height:        Examination:  General exam: awake, alert, no acute distress, chronically ill-appearing HEENT: wearing nasal cannula, moist mucus membranes, hearing grossly normal  Respiratory system: very poor air  movement, not actively wheezing, using accessory muscles at rest, on 3 L/min O2 [place] Cardiovascular system: normal S1/S2, RRR, no pedal edema.   Gastrointestinal system: soft, NT, ND, no HSM felt, +bowel sounds. Central nervous system: A&O x3. no gross focal neurologic deficits, normal speech Extremities: moves all, no edema, normal tone Skin: dry, intact, normal temperature Psychiatry: anxious mood, congruent affect   Procedures:  None  Microbiology summarized: COVID-19 and influenza PCR nonreactive. MRSA PCR screen negative. Blood cultures NGTD   Labs & data reviewed -- notable results as below: Gluse 151, albumin 2.8.  CBC with Hbg 7.5 from 8.0, leukocytosis resolved x 2 days. Procal < 0.10.  Low iron, TIBC, sat ratio. Normal vitamin B12 and folate levels.   Assessment and plan: Principal Problem:   Acute on chronic respiratory failure with hypoxemia (HCC) Active Problems:   Severe sepsis (HCC)   COPD with acute exacerbation (HCC)   Essential hypertension   Bilateral pneumonia   Normocytic anemia   Paroxysmal A-fib (HCC)   Fall at home, initial encounter   History of CAD (coronary artery disease)   Chronic diastolic CHF (congestive heart failure) ([place])   DNR (do not resuscitate)  Severe sepsis due to bilateral pneumonia: POA.  Tachypnea, leukocytosis with respiratory failure.  CXR concerning for multilobar pneumonia.  COVID-19, influenza, RSV, MRSA PCR screen and blood cultures negative so far.  Has history of Pseudomonas infection.  Sepsis physiology resolving. -Continue IV cefepime and doxycycline given recent hospitalization -Incentive spirometry, flutter valve, OOB  Acute on chronic respiratory failure with hypoxemia due to pneumonia, COPD exacerbation and possible ILD: Desaturated to 89% on 6 L by [place] while in ED.  Now saturating in 90s on home 3 L at rest.  Continues to endorse shortness of breath and dry cough.  -Antibiotics as above -Continue IV Solu-Medrol,  reduce dose to 60 mg IV BID -Scheduled and as needed nebulizers. -Pulmonary toilet as above  Chronic diastolic CHF: TTE in 46/5681 with LVEF of 55 to 60% and G2 DD.  Appears euvolemic on exam. -Continue home Lasix -Hold HCTZ -Strict intake and output, renal functions and electrolytes.  Essential hypertension: BP improved. -Continue home medications  Paroxysmal A-fib: In sinus rhythm.  Does not seem to be on [facility] likely due to anemia. -Continue home amiodarone  History of CAD: No chest pain or anginal symptoms.  Serial troponin and EKG negative. -Continue home meds  Recent fall at home: Has some bruise over her back but no tenderness over spinous process.  No neurologic deficit. -PT/OT -Fall precaution  Underlying interstitial lung disease, questionable MAI on chest x-ray: Outpatient follow-up with pulmonologist  -Outpatient follow-up with pulmonology  Normocytic anemia: Patient denies GI bleed, melena or hematochezia.  It seems she had 1 unit previous hospitalization with good response.  GI was consulted but family decided to hold off EGD and colonoscopy at that time.  She was discharged on PPI twice daily.  Some drop in Hgb could also be dilutional from IV fluid. Recent Labs    01/17/22 0416 01/18/22 0408 01/18/22 2007 01/19/22 0529 01/20/22 0532 01/21/22 2751 02/02/22 1401 03/04/22 0639 03/05/22 0442 03/06/22 0413  HGB 7.1* 7.1* 9.8* 10.8* 9.4* 10.2* 11.4 8.0* 8.0* 7.5*  -P.o. Protonix twice daily -May have to hold Plavix if H&H continues to drop. -Check anemia panel  Urinary retention: Bladder scan showed about 1 L overnight but only 500 UOP charted from overnight. -Strict intake and output -Bladder scan every 6 hours  Possible constipation -Continue MiraLAX.  Mild cognitive impairment - family report early signs of dementia. -Continue home Seroquel at bedtime -Delirium precautions -Taper down steroid asap   Goal of care: DNR/DNI.   Previously evaluated by  palliative medicine.  Body mass index is 28.03 kg/m.          DVT prophylaxis:  enoxaparin (LOVENOX) injection 40 mg Start: 03/06/22 2200 SCDs Start: 03/04/22 0920  Code Status: DNR/DNI Family Communication: None at bedside Level of care: Med-Surg Status is: Inpatient  Remains inpatient appropriate because: Due to sepsis in the setting of pneumonia and acute on chronic respiratory failure. Remains on IV steroids and anbiotics until further clinical improvement. Pt remains significantly dyspneic even at rest.     Consultants:  None        Dr. Nicole Kindred, DO [facility]   If 7PM-7AM, please contact night-coverage [email or web address] 03/06/2022, 12:55 PM

## 2022-03-06 NOTE — TOC Initial Note (Signed)
Transition of Care [facility]) - Initial/Assessment Note    Patient Details  Name: Kristi Clark MRN: [id number] Date of Birth: June 15, 1950  [facility] of [facility]) CM/SW Contact:    Magnus Ivan, LCSW Phone Number: 03/06/2022, 10:28 AM  Clinical Narrative:                 Completed assessment with patient. Patient lives with her niece and grandson.  PCP is Dr. Wynetta Emery. Pharmacy is Paediatric nurse on [address].  Patient has oxygen (Adapt), grab bars, BSC, hospital bed, rollator, and shower chair at home.  Family provides transport.  Patient is active with [facility] (confirmed with Representative Malachy Mood) and they will continue to follow at DC.   Expected Discharge Plan: [place] Barriers to Discharge: Continued Medical Work up   Patient Goals and CMS Choice Patient states their goals for this hospitalization and ongoing recovery are:: home with home health CMS Medicare.gov Compare Post Acute Care list provided to:: Patient Choice offered to / list presented to : Patient      Expected Discharge Plan and Services       Living arrangements for the past 2 months: [place]: OT, PT [place] Agency: [place] Date [place]: 03/06/22   Representative spoke with at [place]: [place] Arrangements/Services Living arrangements for the past 2 months: [place] Lives with:: Relatives Patient language and need for interpreter reviewed:: Yes Do you feel safe going back to the place where you live?: Yes      Need for Family Participation in Patient Care: Yes (Comment) Care giver support system in place?: Yes (comment) Current home services: DME, Home PT Criminal Activity/Legal Involvement Pertinent to Current Situation/Hospitalization: No - Comment as needed  Activities of Daily Living Home Assistive Devices/Equipment: Environmental consultant (specify type), Oxygen,  Wheelchair, Eyeglasses, Dentures (specify type) ADL Screening (condition at time of admission) Patient's cognitive ability adequate to safely complete daily activities?: Yes Is the patient deaf or have difficulty hearing?: No Does the patient have difficulty seeing, even when wearing glasses/contacts?: No Does the patient have difficulty concentrating, remembering, or making decisions?: No Patient able to express need for assistance with ADLs?: Yes Does the patient have difficulty dressing or bathing?: No Independently performs ADLs?: Yes (appropriate for developmental age) Does the patient have difficulty walking or climbing stairs?: Yes Weakness of Legs: Both Weakness of Arms/Hands: None  Permission Sought/Granted Permission sought to share information with : Chartered certified accountant granted to share information with : Yes, Verbal Permission Granted     Permission granted to share info w AGENCY: [facility]        Emotional Assessment       Orientation: : Oriented to Self, Oriented to Place, Oriented to  Time, Oriented to Situation Alcohol / Substance Use: Not Applicable Psych Involvement: No (comment)  Admission diagnosis:  Bronchopneumonia [J18.0] COPD exacerbation ([place]) [J44.1] Severe sepsis ([place]) [A41.9, R65.20] Acute on chronic respiratory failure with hypoxia (HCC) [J96.21] Acute on chronic respiratory failure with hypoxemia (HCC) [J96.21] Patient Active Problem List   Diagnosis Date Noted   Paroxysmal A-fib ([place]) 03/05/2022   Fall at home, initial encounter 03/05/2022   History of CAD (coronary artery disease) 03/05/2022   Chronic diastolic CHF (congestive heart failure) ([place]) 03/05/2022   DNR (  do not resuscitate) 03/05/2022   Acute on chronic respiratory failure with hypoxemia ([place]) 03/04/2022   Hypotension due to drugs 02/10/2022   Confusion 02/10/2022   Depression, recurrent ([place]) 02/02/2022   Normocytic anemia 01/13/2022   Atrial fibrillation  with RVR ([place]) 01/13/2022   Bilateral pneumonia 11/23/2021   Chronic respiratory failure with hypoxia ([place]) 09/21/2021   Valvular vegetation 05/06/2021   Malnutrition of moderate degree 04/29/2021   Unintentional weight loss 04/28/2021   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 ([place]) 03/15/2021   Aortic atherosclerosis ([place]) 05/27/2020   COPD with acute exacerbation ([place]) 02/17/2020   Postherpetic neuralgia 11/24/2019   Nicotine dependence, cigarettes, w unsp disorders 10/17/2017   IBS (irritable bowel syndrome) 04/04/2017   Goals of care, counseling/discussion 10/03/2016   CAD (coronary artery disease) 10/03/2016   Iron deficiency anemia 10/03/2016   Essential hypertension 03/06/2015   Allergic rhinitis 09/03/2014   GERD (gastroesophageal reflux disease) 09/03/2014   Hyperlipidemia 09/03/2014   Severe sepsis ([place]) 07/14/2014   COPD, severe ([place]) 08/08/2013   Pulmonary nodules 08/08/2013   PCP:  [name], DO Pharmacy:   [facility] [address]), [place] [address] [place] ([place]) [place] [address] Phone: [phone] Fax: [phone]  [facility] [place], [place] [address] [place] [address] Phone: [phone] Fax: [phone]     Social Determinants of Health (SDOH) Social History: SDOH Screenings   Food Insecurity: No Food Insecurity (03/05/2022)  Housing: Low Risk  (03/05/2022)  Transportation Needs: No Transportation Needs (03/05/2022)  Utilities: Not At Risk (03/05/2022)  Alcohol Screen: Low Risk  (05/31/2021)  Depression (PHQ2-9): High Risk (03/03/2022)  Financial Resource Strain: Low Risk  (05/31/2021)  Physical Activity: Inactive (05/31/2021)  Social Connections: Socially Isolated (05/31/2021)  Stress: No Stress Concern Present (05/31/2021)  Tobacco Use: Medium Risk (03/04/2022)   SDOH Interventions:      Readmission Risk Interventions    03/06/2022   10:27 AM  Readmission Risk Prevention Plan  Transportation Screening Complete  PCP or Specialist Appt within 3-5 Days Complete  HRI or [place] Complete  Social Work Consult for [place] Planning/Counseling Complete  Palliative Care Screening Not Applicable  Medication Review Press photographer) Complete

## 2022-03-07 DIAGNOSIS — J9621 Acute and chronic respiratory failure with hypoxia: Secondary | ICD-10-CM | POA: Diagnosis not present

## 2022-03-07 DIAGNOSIS — J441 Chronic obstructive pulmonary disease with (acute) exacerbation: Secondary | ICD-10-CM | POA: Diagnosis not present

## 2022-03-07 DIAGNOSIS — J189 Pneumonia, unspecified organism: Secondary | ICD-10-CM | POA: Diagnosis not present

## 2022-03-07 DIAGNOSIS — I5032 Chronic diastolic (congestive) heart failure: Secondary | ICD-10-CM | POA: Diagnosis not present

## 2022-03-07 LAB — CBC
HCT: 21 % — ABNORMAL LOW (ref 36.0–46.0)
Hemoglobin: 6.9 g/dL — ABNORMAL LOW (ref 12.0–15.0)
MCH: 29.2 pg (ref 26.0–34.0)
MCHC: 32.9 g/dL (ref 30.0–36.0)
MCV: 89 fL (ref 80.0–100.0)
Platelets: 236 10*3/uL (ref 150–400)
RBC: 2.36 MIL/uL — ABNORMAL LOW (ref 3.87–5.11)
RDW: 18.4 % — ABNORMAL HIGH (ref 11.5–15.5)
WBC: 7.2 10*3/uL (ref 4.0–10.5)
nRBC: 0 % (ref 0.0–0.2)

## 2022-03-07 LAB — TYPE AND SCREEN
ABO/RH(D): A NEG
Antibody Screen: NEGATIVE

## 2022-03-07 LAB — HEMOGLOBIN AND HEMATOCRIT, BLOOD
HCT: 27 % — ABNORMAL LOW (ref 36.0–46.0)
Hemoglobin: 9 g/dL — ABNORMAL LOW (ref 12.0–15.0)

## 2022-03-07 LAB — PREPARE RBC (CROSSMATCH)

## 2022-03-07 MED ORDER — DM-GUAIFENESIN ER 30-600 MG PO TB12
1.0000 | ORAL_TABLET | Freq: Two times a day (BID) | ORAL | Status: DC
  Administered 2022-03-07 – 2022-03-08 (×3): 1 via ORAL
  Filled 2022-03-07 (×3): qty 1

## 2022-03-07 MED ORDER — PREDNISONE 50 MG PO TABS
50.0000 mg | ORAL_TABLET | Freq: Every day | ORAL | Status: DC
  Administered 2022-03-08: 50 mg via ORAL
  Filled 2022-03-07: qty 1

## 2022-03-07 MED ORDER — SODIUM CHLORIDE 0.9% IV SOLUTION: Freq: Once | INTRAVENOUS | Status: DC

## 2022-03-07 MED ORDER — MORPHINE SULFATE (PF) 2 MG/ML IV SOLN: 1.0000 mg | INTRAVENOUS | Status: DC | PRN

## 2022-03-07 MED ORDER — SALINE SPRAY 0.65 % NA SOLN
1.0000 | NASAL | Status: DC | PRN
  Filled 2022-03-07: qty 44

## 2022-03-07 MED ORDER — CHLORHEXIDINE GLUCONATE CLOTH 2 % EX PADS
6.0000 | MEDICATED_PAD | Freq: Every day | CUTANEOUS | Status: DC
  Administered 2022-03-07 – 2022-03-08 (×2): 6 via TOPICAL

## 2022-03-07 NOTE — Plan of Care (Signed)

## 2022-03-07 NOTE — Progress Notes (Signed)
PROGRESS NOTE  Kristi Clark OEH:[id number] DOB: [birth year]   PCP: [name], DO  Patient is from: Home.  Lives with niece.  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  Reportedly febrile to 101 at home.  Hypoxic to 70% on 3 L when EMS arrived.  Hypoxic to 89% on 6 L in ED.  Tachypneic to 20s and 30s.  WBC 12.8.  Lactic acid and Pro-Cal negative.  CXR with chronic changes in the lungs and emphysema with superimposed acute multilobar bronchopneumonia based on progressive change since her x-ray a day prior.  Patient admitted for severe sepsis with acute on chronic respiratory failure with hypoxia in the setting of bilateral pneumonia.  Started on vancomycin and cefepime and transition to cefepime and doxycycline on admission.  Also on IV steroids.    Subjective: Pt seen with son-in-law at bedside.  She is quite anxious and family report increased anxiety and ?steroid side effects as pt has history of steroid-induced psychosis.  Pt reports being tearful a short while ago, feels anxious because she feels she cannot breath.  Says phlegm starting to come up.     Objective: Vitals:   03/06/22 0415 03/06/22 0500 03/06/22 0726 03/06/22 0904  BP:   (!) 170/61   Pulse:   90 89  Resp:   17 16  Temp:   98 F (36.7 C)   TempSrc:      SpO2: 93%  96% 91%  Weight:  48.9 kg    Height:        Examination:  General exam: awake, alert, no acute distress, chronically ill-appearing HEENT: wearing nasal cannula, moist mucus membranes, hearing grossly normal  Respiratory system: very poor air  movement, not actively wheezing, using accessory muscles at rest, on 3 L/min O2 [place] Cardiovascular system: normal S1/S2, RRR, no pedal edema.   Gastrointestinal system: soft, NT, ND, no HSM felt, +bowel sounds. Central nervous system: A&O x3. no gross focal neurologic deficits, normal speech Extremities: moves all, no edema, normal tone Skin: dry, intact, normal temperature Psychiatry: anxious mood, congruent affect   Procedures:  None  Microbiology summarized: COVID-19 and influenza PCR nonreactive. MRSA PCR screen negative. Blood cultures NGTD   Labs & data reviewed -- notable results as below: Gluse 151, albumin 2.8.  CBC with Hbg 7.5 from 8.0, leukocytosis resolved x 2 days. Procal < 0.10.  Low iron, TIBC, sat ratio. Normal vitamin B12 and folate levels.   Assessment and plan: Principal Problem:   Acute on chronic respiratory failure with hypoxemia (HCC) Active Problems:   Severe sepsis (HCC)   COPD with acute exacerbation (HCC)   Essential hypertension   Bilateral pneumonia   Normocytic anemia   Paroxysmal A-fib (HCC)   Fall at home, initial encounter   History of CAD (coronary artery disease)   Chronic diastolic CHF (congestive heart failure) ([place])   DNR (do not resuscitate)  Severe sepsis due to bilateral pneumonia: POA.  Complicated by underlying end stage COPD. Presented with tachypnea, leukocytosis with respiratory failure.  CXR concerning for multilobar pneumonia.  COVID-19, influenza, RSV, MRSA PCR screen and blood cultures negative  so far.  Has history of Pseudomonas infection.  Sepsis physiology resolving. -Continue IV cefepime and doxycycline given recent hospitalization -Incentive spirometry, flutter valve, OOB -Chest PT - vest ordered -Scheduled Mucinex  Acute on chronic respiratory failure with hypoxemia due to pneumonia, End Stage COPD exacerbation and possible ILD: Desaturated to 89% on 6 L by [place] while in ED.  Now saturating in 90s on home 3 L at rest.   Continues to endorse shortness of breath and dry cough.  -Antibiotics as above -Continue IV Solu-Medrol, 60 mg IV BID for today -Start Prednisone 50 mg PO daily tomorrow with breakfast -Low dose morphine for dyspnea/air hunger - trial -Scheduled and as needed nebulizers. -Pulmonary toilet as above  Acute on Chronic Normocytic anemia: Patient denies GI bleed, melena or hematochezia. She required 1 unit previous hospitalization with good response.  GI was consulted but family decided to hold off EGD and colonoscopy at that time, planned for outpatient follow up.  She was discharged on PPI twice daily.   Some drop in Hgb could also be dilutional from IV fluid. 03/07/22: Hbg 6.9 - pt & family consented for transfusion -1 unit pRBC's transfusion -Repeat H&H post transfusion -Outpatient GI follow up - pt acute illness precludes sedation for endoscopic evaluation at this time -P.o. Protonix twice daily -Hold Plavix for now -Anemia panel shows iron deficiency, normal folate and B12 -Defer iron infusion with infection -Will discuss PO iron supplement and start  Urinary retention: Bladder scan showed about 1 L overnight but only 500 UOP charted from overnight.  Foley was placed. -D/C Foley today & monitor bladder function -Strict intake and output -Bladder scans  Chronic diastolic CHF: TTE in 31/4970 with LVEF of 55 to 60% and G2 DD.  Appears euvolemic on exam. -Continue home Lasix -Hold HCTZ -Strict intake and output, renal functions and electrolytes.  Essential hypertension: BP improved. -Continue home medications  Paroxysmal A-fib: In sinus rhythm.  Does not seem to be on [facility] likely due to anemia. -Continue home amiodarone  History of CAD: No chest pain or anginal symptoms.  Serial troponin and EKG negative. -Continue home meds  Recent fall at home: Has some bruise over her back but no tenderness over spinous process.  No neurologic deficit. -PT/OT -Fall precaution  Underlying  interstitial lung disease, questionable MAI on chest x-ray: Outpatient follow-up with pulmonologist  -Outpatient follow-up with pulmonology  Constipation -Continue MiraLAX.  Mild cognitive impairment - family report early signs of dementia. -Continue home Seroquel at bedtime -Delirium precautions -Taper down steroid asap   Goal of care: DNR/DNI.   Previously evaluated by palliative medicine.  Body mass index is 28.03 kg/m.          DVT prophylaxis:  enoxaparin (LOVENOX) injection 40 mg Start: 03/06/22 2200 SCDs Start: 03/04/22 0920  Code Status: DNR/DNI Family Communication: None at bedside Level of care: Med-Surg Status is: Inpatient  Remains inpatient appropriate because: Due to anemia requiring blood transfusion. Ongoing dyspnea even at rest, needs further clinical improvement before discharge.    Consultants:  None        Dr. Nicole Kindred, DO [facility]   If 7PM-7AM, please contact night-coverage [email or web address] 03/06/2022, 12:55 PM

## 2022-03-07 NOTE — Plan of Care (Signed)
  Problem: Health Behavior/Discharge Planning: Goal: Ability to manage health-related needs will improve Outcome: Progressing   Problem: Clinical Measurements: Goal: Diagnostic test results will improve Outcome: Progressing   Problem: Activity: Goal: Risk for activity intolerance will decrease Outcome: Progressing   Problem: Nutrition: Goal: Adequate nutrition will be maintained Outcome: Progressing   Problem: Coping: Goal: Level of anxiety will decrease Outcome: Progressing   Problem: Coping: Goal: Level of anxiety will decrease Outcome: Progressing

## 2022-03-08 ENCOUNTER — Ambulatory Visit: Payer: Medicare Other | Admitting: Family Medicine

## 2022-03-08 ENCOUNTER — Encounter: Payer: Self-pay | Admitting: Student

## 2022-03-08 DIAGNOSIS — J9621 Acute and chronic respiratory failure with hypoxia: Secondary | ICD-10-CM | POA: Diagnosis not present

## 2022-03-08 DIAGNOSIS — J189 Pneumonia, unspecified organism: Secondary | ICD-10-CM | POA: Diagnosis not present

## 2022-03-08 DIAGNOSIS — I5032 Chronic diastolic (congestive) heart failure: Secondary | ICD-10-CM | POA: Diagnosis not present

## 2022-03-08 DIAGNOSIS — J441 Chronic obstructive pulmonary disease with (acute) exacerbation: Secondary | ICD-10-CM | POA: Diagnosis not present

## 2022-03-08 LAB — LEGIONELLA PNEUMOPHILA SEROGP 1 UR AG: L. pneumophila Serogp 1 Ur Ag: NEGATIVE

## 2022-03-08 LAB — CBC
HCT: 26.9 % — ABNORMAL LOW (ref 36.0–46.0)
Hemoglobin: 8.9 g/dL — ABNORMAL LOW (ref 12.0–15.0)
MCH: 29.7 pg (ref 26.0–34.0)
MCHC: 33.1 g/dL (ref 30.0–36.0)
MCV: 89.7 fL (ref 80.0–100.0)
Platelets: 261 10*3/uL (ref 150–400)
RBC: 3 MIL/uL — ABNORMAL LOW (ref 3.87–5.11)
RDW: 17.3 % — ABNORMAL HIGH (ref 11.5–15.5)
WBC: 6.7 10*3/uL (ref 4.0–10.5)
nRBC: 0 % (ref 0.0–0.2)

## 2022-03-08 LAB — BPAM RBC
Blood Product Expiration Date: 202401262359
ISSUE DATE / TIME: 202401011537
Unit Type and Rh: 600

## 2022-03-08 LAB — TYPE AND SCREEN
ABO/RH(D): A NEG
Antibody Screen: NEGATIVE
Unit division: 0

## 2022-03-08 MED ORDER — PREDNISONE 10 MG PO TABS: ORAL_TABLET | ORAL | 0 refills | Status: AC

## 2022-03-08 MED ORDER — LOSARTAN POTASSIUM 50 MG PO TABS: 50.0000 mg | ORAL_TABLET | Freq: Every day | ORAL | Status: DC

## 2022-03-08 MED ORDER — AMLODIPINE BESYLATE 5 MG PO TABS: 5.0000 mg | ORAL_TABLET | Freq: Every day | ORAL | Status: DC

## 2022-03-08 MED ORDER — DM-GUAIFENESIN ER 30-600 MG PO TB12: 1.0000 | ORAL_TABLET | Freq: Two times a day (BID) | ORAL | 0 refills | Status: AC

## 2022-03-08 MED ORDER — CEFDINIR 300 MG PO CAPS: 300.0000 mg | ORAL_CAPSULE | Freq: Two times a day (BID) | ORAL | 0 refills | Status: AC

## 2022-03-08 MED ORDER — FERROUS SULFATE 325 (65 FE) MG PO TABS: 325.0000 mg | ORAL_TABLET | Freq: Every day | ORAL | Status: DC

## 2022-03-08 MED ORDER — FERROUS SULFATE 325 (65 FE) MG PO TABS: 325.0000 mg | ORAL_TABLET | Freq: Every day | ORAL | 3 refills | Status: DC

## 2022-03-08 MED ORDER — DOXYCYCLINE HYCLATE 100 MG PO TABS: 100.0000 mg | ORAL_TABLET | Freq: Two times a day (BID) | ORAL | 0 refills | Status: AC

## 2022-03-08 MED ORDER — IPRATROPIUM-ALBUTEROL 0.5-2.5 (3) MG/3ML IN SOLN
3.0000 mL | Freq: Three times a day (TID) | RESPIRATORY_TRACT | Status: DC
  Administered 2022-03-08: 3 mL via RESPIRATORY_TRACT
  Filled 2022-03-08: qty 3

## 2022-03-08 MED ORDER — ALBUTEROL SULFATE (2.5 MG/3ML) 0.083% IN NEBU: 2.5000 mg | INHALATION_SOLUTION | RESPIRATORY_TRACT | 1 refills | Status: DC | PRN

## 2022-03-08 NOTE — Plan of Care (Signed)

## 2022-03-08 NOTE — TOC Progression Note (Signed)
Transition of Care [facility]) - Progression Note    Patient Details  Name: Kristi Clark MRN: [id number] Date of Birth: 05/03/1950  Transition of Care [facility]) CM/SW [place], RN Phone Number: 03/08/2022, 12:31 PM  Clinical Narrative:     Met with the patient and her daughter in the room, I explained [place] and provided the daughters number for them to call, I provided [place] [employer] resource to arrange for meal delivery at no cost, I also explained if they choose to get Palliative or Hospice that the [facility] agency can arrange  Expected Discharge Plan: [place] Barriers to Discharge: Continued Medical Work up  Expected Discharge Plan and [place] arrangements for the past 2 months: [place] Expected Discharge Date: 03/08/22                         HH Arranged: OT, PT HH Agency: [place] Date [place]: 03/06/22   Representative spoke with at [place]: [place] Determinants of Health ([place]) Interventions [place]: No Food Insecurity (03/05/2022)  Housing: Low Risk  (03/05/2022)  Transportation Needs: No Transportation Needs (03/05/2022)  Utilities: Not At Risk (03/05/2022)  Alcohol Screen: Low Risk  (05/31/2021)  Depression (PHQ2-9): High Risk (03/03/2022)  Financial Resource Strain: Low Risk  (05/31/2021)  Physical Activity: Inactive (05/31/2021)  Social Connections: Socially Isolated (05/31/2021)  Stress: No Stress Concern Present (05/31/2021)  Tobacco Use: Medium Risk (03/04/2022)    Readmission Risk Interventions    03/06/2022   10:27 AM  Readmission Risk Prevention Plan  Transportation Screening Complete  PCP or Specialist Appt within 3-5 Days Complete  HRI or [place] Complete  Social Work Consult for [place] Planning/Counseling Complete  Palliative Care Screening Not Applicable  Medication Review Press photographer) Complete

## 2022-03-08 NOTE — Care Management Important Message (Signed)
Important Message  Patient Details  Name: LAKIESHA RALPHS MRN: [id number] Date of Birth: 01-18-1951   Medicare Important Message Given:  Yes     Juliann Pulse A [place] 03/08/2022, 12:19 PM

## 2022-03-08 NOTE — TOC Progression Note (Signed)
Transition of Care [facility]) - Progression Note    Patient Details  Name: Kristi Clark MRN: [id number] Date of Birth: 07/27/1950  Transition of Care [facility]) CM/SW [place], RN Phone Number: 03/08/2022, 11:27 AM  Clinical Narrative:    Samuella Cota with [facility] that the patient will DC home today, they will follow for [facility]    Expected Discharge Plan: [place] Barriers to Discharge: Continued Medical Work up  Expected Discharge Plan and [place] arrangements for the past 2 months: [place] Expected Discharge Date: 03/08/22                         HH Arranged: OT, PT HH Agency: [place] Date [place]: 03/06/22   Representative spoke with at [place]: [place] ([place]) Interventions [place]: No Food Insecurity (03/05/2022)  Housing: Low Risk  (03/05/2022)  Transportation Needs: No Transportation Needs (03/05/2022)  Utilities: Not At Risk (03/05/2022)  Alcohol Screen: Low Risk  (05/31/2021)  Depression (PHQ2-9): High Risk (03/03/2022)  Financial Resource Strain: Low Risk  (05/31/2021)  Physical Activity: Inactive (05/31/2021)  Social Connections: Socially Isolated (05/31/2021)  Stress: No Stress Concern Present (05/31/2021)  Tobacco Use: Medium Risk (03/04/2022)    Readmission Risk Interventions    03/06/2022   10:27 AM  Readmission Risk Prevention Plan  Transportation Screening Complete  PCP or Specialist Appt within 3-5 Days Complete  HRI or [place] Complete  Social Work Consult for [place] Planning/Counseling Complete  Palliative Care Screening Not Applicable  Medication Review Press photographer) Complete

## 2022-03-08 NOTE — Plan of Care (Signed)
Problem: Education: Goal: Knowledge of disease or condition will improve 03/08/2022 1325 by Alferd Apa, RN Outcome: Adequate for Discharge 03/08/2022 1135 by Alferd Apa, RN Outcome: Adequate for Discharge 03/08/2022 [phone] by Alferd Apa, RN Outcome: Progressing Goal: Knowledge of the prescribed therapeutic regimen will improve 03/08/2022 1325 by Alferd Apa, RN Outcome: Adequate for Discharge 03/08/2022 1135 by Alferd Apa, RN Outcome: Adequate for Discharge 03/08/2022 0738 by Alferd Apa, RN Outcome: Progressing Goal: Individualized Educational Video(s) 03/08/2022 1325 by Alferd Apa, RN Outcome: Adequate for Discharge 03/08/2022 1135 by Alferd Apa, RN Outcome: Adequate for Discharge 03/08/2022 [phone] by Alferd Apa, RN Outcome: Progressing   Problem: Activity: Goal: Ability to tolerate increased activity will improve 03/08/2022 1325 by Alferd Apa, RN Outcome: Adequate for Discharge 03/08/2022 1135 by Alferd Apa, RN Outcome: Adequate for Discharge 03/08/2022 [phone] by Alferd Apa, RN Outcome: Progressing Goal: Will verbalize the importance of balancing activity with adequate rest periods 03/08/2022 1325 by Alferd Apa, RN Outcome: Adequate for Discharge 03/08/2022 1135 by Alferd Apa, RN Outcome: Adequate for Discharge 03/08/2022 0738 by Alferd Apa, RN Outcome: Progressing   Problem: Respiratory: Goal: Ability to maintain a clear airway will improve 03/08/2022 1325 by Alferd Apa, RN Outcome: Adequate for Discharge 03/08/2022 1135 by Alferd Apa, RN Outcome: Adequate for Discharge 03/08/2022 [phone] by Alferd Apa, RN Outcome: Progressing Goal: Levels of oxygenation will improve 03/08/2022 1325 by Alferd Apa, RN Outcome: Adequate for Discharge 03/08/2022 1135 by Alferd Apa, RN Outcome: Adequate for Discharge 03/08/2022 [phone] by Alferd Apa, RN Outcome: Progressing Goal: Ability to maintain adequate ventilation will improve 03/08/2022 1325 by Alferd Apa,  RN Outcome: Adequate for Discharge 03/08/2022 1135 by Alferd Apa, RN Outcome: Adequate for Discharge 03/08/2022 0738 by Alferd Apa, RN Outcome: Progressing   Problem: Activity: Goal: Ability to tolerate increased activity will improve 03/08/2022 1325 by Alferd Apa, RN Outcome: Adequate for Discharge 03/08/2022 1135 by Alferd Apa, RN Outcome: Adequate for Discharge 03/08/2022 [phone] by Alferd Apa, RN Outcome: Progressing   Problem: Clinical Measurements: Goal: Ability to maintain a body temperature in the normal range will improve 03/08/2022 1325 by Alferd Apa, RN Outcome: Adequate for Discharge 03/08/2022 1135 by Alferd Apa, RN Outcome: Adequate for Discharge 03/08/2022 0738 by Alferd Apa, RN Outcome: Progressing   Problem: Respiratory: Goal: Ability to maintain adequate ventilation will improve 03/08/2022 1325 by Alferd Apa, RN Outcome: Adequate for Discharge 03/08/2022 1135 by Alferd Apa, RN Outcome: Adequate for Discharge 03/08/2022 [phone] by Alferd Apa, RN Outcome: Progressing Goal: Ability to maintain a clear airway will improve 03/08/2022 1325 by Alferd Apa, RN Outcome: Adequate for Discharge 03/08/2022 1135 by Alferd Apa, RN Outcome: Adequate for Discharge 03/08/2022 [phone] by Alferd Apa, RN Outcome: Progressing   Problem: Education: Goal: Knowledge of General Education information will improve Description: Including pain rating scale, medication(s)/side effects and non-pharmacologic comfort measures 03/08/2022 1325 by Alferd Apa, RN Outcome: Adequate for Discharge 03/08/2022 1135 by Alferd Apa, RN Outcome: Adequate for Discharge 03/08/2022 0738 by Alferd Apa, RN Outcome: Progressing   Problem: Health Behavior/Discharge Planning: Goal: Ability to manage health-related needs will improve 03/08/2022 1325 by Alferd Apa, RN Outcome: Adequate for Discharge 03/08/2022 1135 by Alferd Apa, RN Outcome: Adequate for Discharge 03/08/2022 [phone] by Alferd Apa, RN Outcome: Progressing  Problem: Clinical Measurements: Goal: Ability to maintain clinical measurements within normal limits will improve 03/08/2022 1325 by Alferd Apa, RN Outcome: Adequate for Discharge 03/08/2022 1135 by Alferd Apa, RN Outcome: Adequate for Discharge 03/08/2022 [phone] by Alferd Apa, RN Outcome: Progressing Goal: Will remain free from infection 03/08/2022 1325 by Alferd Apa, RN Outcome: Adequate for Discharge 03/08/2022 1135 by Alferd Apa, RN Outcome: Adequate for Discharge 03/08/2022 [phone] by Alferd Apa, RN Outcome: Progressing Goal: Diagnostic test results will improve 03/08/2022 1325 by Alferd Apa, RN Outcome: Adequate for Discharge 03/08/2022 1135 by Alferd Apa, RN Outcome: Adequate for Discharge 03/08/2022 [phone] by Alferd Apa, RN Outcome: Progressing Goal: Respiratory complications will improve 03/08/2022 1325 by Alferd Apa, RN Outcome: Adequate for Discharge 03/08/2022 1135 by Alferd Apa, RN Outcome: Adequate for Discharge 03/08/2022 [phone] by Alferd Apa, RN Outcome: Progressing Goal: Cardiovascular complication will be avoided 03/08/2022 1325 by Alferd Apa, RN Outcome: Adequate for Discharge 03/08/2022 1135 by Alferd Apa, RN Outcome: Adequate for Discharge 03/08/2022 0738 by Alferd Apa, RN Outcome: Progressing   Problem: Activity: Goal: Risk for activity intolerance will decrease 03/08/2022 1325 by Alferd Apa, RN Outcome: Adequate for Discharge 03/08/2022 1135 by Alferd Apa, RN Outcome: Adequate for Discharge 03/08/2022 [phone] by Alferd Apa, RN Outcome: Progressing   Problem: Nutrition: Goal: Adequate nutrition will be maintained 03/08/2022 1325 by Alferd Apa, RN Outcome: Adequate for Discharge 03/08/2022 1135 by Alferd Apa, RN Outcome: Adequate for Discharge 03/08/2022 0738 by Alferd Apa, RN Outcome: Progressing   Problem: Coping: Goal: Level of anxiety will decrease 03/08/2022 1325 by Alferd Apa,  RN Outcome: Adequate for Discharge 03/08/2022 1135 by Alferd Apa, RN Outcome: Adequate for Discharge 03/08/2022 [phone] by Alferd Apa, RN Outcome: Progressing   Problem: Elimination: Goal: Will not experience complications related to bowel motility 03/08/2022 1325 by Alferd Apa, RN Outcome: Adequate for Discharge 03/08/2022 1135 by Alferd Apa, RN Outcome: Adequate for Discharge 03/08/2022 [phone] by Alferd Apa, RN Outcome: Progressing Goal: Will not experience complications related to urinary retention 03/08/2022 1325 by Alferd Apa, RN Outcome: Adequate for Discharge 03/08/2022 1135 by Alferd Apa, RN Outcome: Adequate for Discharge 03/08/2022 [phone] by Alferd Apa, RN Outcome: Progressing   Problem: Pain Managment: Goal: General experience of comfort will improve 03/08/2022 1325 by Alferd Apa, RN Outcome: Adequate for Discharge 03/08/2022 1135 by Alferd Apa, RN Outcome: Adequate for Discharge 03/08/2022 [phone] by Alferd Apa, RN Outcome: Progressing   Problem: Safety: Goal: Ability to remain free from injury will improve 03/08/2022 1325 by Alferd Apa, RN Outcome: Adequate for Discharge 03/08/2022 1135 by Alferd Apa, RN Outcome: Adequate for Discharge 03/08/2022 [phone] by Alferd Apa, RN Outcome: Progressing   Problem: Skin Integrity: Goal: Risk for impaired skin integrity will decrease 03/08/2022 1325 by Alferd Apa, RN Outcome: Adequate for Discharge 03/08/2022 1135 by Alferd Apa, RN Outcome: Adequate for Discharge 03/08/2022 [phone] by Alferd Apa, RN Outcome: Progressing

## 2022-03-08 NOTE — Plan of Care (Signed)
Problem: Education: Goal: Knowledge of disease or condition will improve 03/08/2022 1135 by Alferd Apa, RN Outcome: Adequate for Discharge 03/08/2022 [phone] by Alferd Apa, RN Outcome: Progressing Goal: Knowledge of the prescribed therapeutic regimen will improve 03/08/2022 1135 by Alferd Apa, RN Outcome: Adequate for Discharge 03/08/2022 0738 by Alferd Apa, RN Outcome: Progressing Goal: Individualized Educational Video(s) 03/08/2022 1135 by Alferd Apa, RN Outcome: Adequate for Discharge 03/08/2022 [phone] by Alferd Apa, RN Outcome: Progressing   Problem: Activity: Goal: Ability to tolerate increased activity will improve 03/08/2022 1135 by Alferd Apa, RN Outcome: Adequate for Discharge 03/08/2022 [phone] by Alferd Apa, RN Outcome: Progressing Goal: Will verbalize the importance of balancing activity with adequate rest periods 03/08/2022 1135 by Alferd Apa, RN Outcome: Adequate for Discharge 03/08/2022 [phone] by Alferd Apa, RN Outcome: Progressing   Problem: Respiratory: Goal: Ability to maintain a clear airway will improve 03/08/2022 1135 by Alferd Apa, RN Outcome: Adequate for Discharge 03/08/2022 0738 by Alferd Apa, RN Outcome: Progressing Goal: Levels of oxygenation will improve 03/08/2022 1135 by Alferd Apa, RN Outcome: Adequate for Discharge 03/08/2022 [phone] by Alferd Apa, RN Outcome: Progressing Goal: Ability to maintain adequate ventilation will improve 03/08/2022 1135 by Alferd Apa, RN Outcome: Adequate for Discharge 03/08/2022 0738 by Alferd Apa, RN Outcome: Progressing   Problem: Activity: Goal: Ability to tolerate increased activity will improve 03/08/2022 1135 by Alferd Apa, RN Outcome: Adequate for Discharge 03/08/2022 [phone] by Alferd Apa, RN Outcome: Progressing   Problem: Clinical Measurements: Goal: Ability to maintain a body temperature in the normal range will improve 03/08/2022 1135 by Alferd Apa, RN Outcome: Adequate for  Discharge 03/08/2022 0738 by Alferd Apa, RN Outcome: Progressing   Problem: Respiratory: Goal: Ability to maintain adequate ventilation will improve 03/08/2022 1135 by Alferd Apa, RN Outcome: Adequate for Discharge 03/08/2022 [phone] by Alferd Apa, RN Outcome: Progressing Goal: Ability to maintain a clear airway will improve 03/08/2022 1135 by Alferd Apa, RN Outcome: Adequate for Discharge 03/08/2022 0738 by Alferd Apa, RN Outcome: Progressing   Problem: Education: Goal: Knowledge of General Education information will improve Description: Including pain rating scale, medication(s)/side effects and non-pharmacologic comfort measures 03/08/2022 1135 by Alferd Apa, RN Outcome: Adequate for Discharge 03/08/2022 0738 by Alferd Apa, RN Outcome: Progressing   Problem: Health Behavior/Discharge Planning: Goal: Ability to manage health-related needs will improve 03/08/2022 1135 by Alferd Apa, RN Outcome: Adequate for Discharge 03/08/2022 [phone] by Alferd Apa, RN Outcome: Progressing   Problem: Clinical Measurements: Goal: Ability to maintain clinical measurements within normal limits will improve 03/08/2022 1135 by Alferd Apa, RN Outcome: Adequate for Discharge 03/08/2022 [phone] by Alferd Apa, RN Outcome: Progressing Goal: Will remain free from infection 03/08/2022 1135 by Alferd Apa, RN Outcome: Adequate for Discharge 03/08/2022 [phone] by Alferd Apa, RN Outcome: Progressing Goal: Diagnostic test results will improve 03/08/2022 1135 by Alferd Apa, RN Outcome: Adequate for Discharge 03/08/2022 [phone] by Alferd Apa, RN Outcome: Progressing Goal: Respiratory complications will improve 03/08/2022 1135 by Alferd Apa, RN Outcome: Adequate for Discharge 03/08/2022 [phone] by Alferd Apa, RN Outcome: Progressing Goal: Cardiovascular complication will be avoided 03/08/2022 1135 by Alferd Apa, RN Outcome: Adequate for Discharge 03/08/2022 [phone] by Alferd Apa, RN Outcome:  Progressing   Problem: Activity: Goal: Risk for activity intolerance will decrease 03/08/2022 1135 by Alferd Apa,  RN Outcome: Adequate for Discharge 03/08/[phone] by Alferd Apa, RN Outcome: Progressing   Problem: Nutrition: Goal: Adequate nutrition will be maintained 03/08/2022 1135 by Alferd Apa, RN Outcome: Adequate for Discharge 03/08/2022 [phone] by Alferd Apa, RN Outcome: Progressing   Problem: Coping: Goal: Level of anxiety will decrease 03/08/2022 1135 by Alferd Apa, RN Outcome: Adequate for Discharge 03/08/2022 0738 by Alferd Apa, RN Outcome: Progressing   Problem: Elimination: Goal: Will not experience complications related to bowel motility 03/08/2022 1135 by Alferd Apa, RN Outcome: Adequate for Discharge 03/08/2022 0738 by Alferd Apa, RN Outcome: Progressing Goal: Will not experience complications related to urinary retention 03/08/2022 1135 by Alferd Apa, RN Outcome: Adequate for Discharge 03/08/2022 0738 by Alferd Apa, RN Outcome: Progressing   Problem: Pain Managment: Goal: General experience of comfort will improve 03/08/2022 1135 by Alferd Apa, RN Outcome: Adequate for Discharge 03/08/2022 0738 by Alferd Apa, RN Outcome: Progressing   Problem: Safety: Goal: Ability to remain free from injury will improve 03/08/2022 1135 by Alferd Apa, RN Outcome: Adequate for Discharge 03/08/2022 0738 by Alferd Apa, RN Outcome: Progressing   Problem: Skin Integrity: Goal: Risk for impaired skin integrity will decrease 03/08/2022 1135 by Alferd Apa, RN Outcome: Adequate for Discharge 03/08/2022 0738 by Alferd Apa, RN Outcome: Progressing

## 2022-03-08 NOTE — Discharge Summary (Addendum)
Physician Discharge Summary   Patient: Kristi Clark MRN: [id number] DOB: 01-25-1951  Admit date:     03/04/2022  Discharge date: 03/08/22  Discharge Physician: Ezekiel Slocumb   PCP: [name], DO   Recommendations at discharge:   Follow up with Pulmonology in 1-2 weeks Follow up with Primary Care in 1-2 weeks Consider referral for home hospice or palliative care Repeat CBC, BMP in 1-2 weeks  Discharge Diagnoses: Principal Problem:   Acute on chronic respiratory failure with hypoxemia ([place]) Active Problems:   COPD with acute exacerbation (HCC)   Essential hypertension   Bilateral pneumonia   Normocytic anemia   Paroxysmal A-fib (HCC)   Fall at home, initial encounter   History of CAD (coronary artery disease)   Chronic diastolic CHF (congestive heart failure) ([place])   DNR (do not resuscitate)  Resolved Problems:   Severe sepsis [facility])  Hospital Course:  72 year old F with PMH of COPD/chronic hypoxic RF on 3 L, recent hospitalization for COPD exacerbation and respiratory failure, ILD, paroxysmal A-fib on Eliquis, CAD, HTN, HLD, GERD, depression and prior tobacco use presenting with increased shortness of breath, dry cough and fever for 2 days.  Reportedly febrile to 101 at home.  Hypoxic to 70% on 3 L when EMS arrived.  Hypoxic to 89% on 6 L in ED.  Tachypneic to 20s and 30s.  WBC 12.8.  Lactic acid and Pro-Cal negative.  CXR with chronic changes in the lungs and emphysema with superimposed acute multilobar bronchopneumonia based on progressive change since her x-ray a day prior.   Patient admitted for severe sepsis with acute on chronic respiratory failure with hypoxia in the setting of bilateral pneumonia.  Started on vancomycin and cefepime and transition to cefepime and doxycycline on admission.  Also on IV steroids.  Further hospital course and management as outlined below.  Assessment and Plan:  Severe sepsis due to bilateral pneumonia: POA.  Complicated by  underlying end stage COPD. Presented with tachypnea, leukocytosis with respiratory failure.  CXR concerning for multilobar pneumonia.  COVID-19, influenza, RSV, MRSA PCR screen and blood cultures negative so far.  Has history of Pseudomonas infection.  Sepsis physiology resolving. -Treated with IV cefepime and doxycycline given recent hospitalization --Discharge with 3 more days doxycycline and Omnicef -Incentive spirometry, flutter valve, OOB -Chest PT - vest ordered -patient denies improvement with this -Scheduled Mucinex   Acute on chronic respiratory failure with hypoxemia due to pneumonia, End Stage COPD exacerbation and possible ILD: Desaturated to 89% on 6 L by [place] while in ED.     03/08/2022: Patient has been stable on her baseline oxygen requirement few days.  She has intermittent dyspnea especially with exertion but appears at her baseline, given end-stage COPD.  No longer showing signs of increased respiratory effort accessory muscle use at rest as she has been prior days.  She is tolerating ambulation in the room with stable O2 sats and only minimal dyspnea.  -Antibiotics as above -Treated with high doses of IV Solu-Medrol -Start Prednisone 50 mg PO daily this morning, taper over next 10 days -Low dose morphine for dyspnea/air hunger - trial -Scheduled and as needed nebulizers. -Pulmonary toilet as above -Close pulmonology follow-up -Recommend consideration for palliative or home hospice services for symptom management (note, patient declined to try morphine for persistent dyspnea and air hunger this admission)   Acute on Chronic Normocytic anemia: Patient denies GI bleed, melena or hematochezia. She required 1 unit previous hospitalization with good response.  GI  was consulted but family decided to hold off EGD and colonoscopy at that time, planned for outpatient follow up.  She was discharged on PPI twice daily.   Some drop in Hgb could also be dilutional from IV fluid. 03/07/22: Hbg  6.9 -transfused 1 unit pRBC's good response 03/08/2022: Hemoglobin 8.9 -Outpatient GI follow up - pt acute illness precludes sedation for endoscopic evaluation at this time -P.o. Protonix twice daily -We held Plavix briefly when anemia worsened, okay to resume at discharge, no concern of active bleeding -Anemia panel shows iron deficiency, normal folate and B12 -Defer iron infusion with infection -Started oral iron supplement   Urinary retention: Bladder scan showed about 1 L overnight but only 500 UOP charted from overnight.  Foley was placed. Foley discontinued yesterday 03/07/2022.  Patient is voiding without difficulty, no further signs of urinary retention.   Chronic diastolic CHF: TTE in 54/0981 with LVEF of 55 to 60% and G2 DD.  Appears euvolemic on exam. -Continue home medications -Recommend daily weights at home -Monitor renal functions and electrolyte at follow-up   Essential hypertension: BP improved. -Continue home medications   Paroxysmal A-fib: In sinus rhythm.   Not on anticoagulation likely due to anemia. -Continue home amiodarone   History of CAD: No chest pain or anginal symptoms.  Serial troponin and EKG negative. -Continue home meds   Recent fall at home: Has some bruise over her back but no tenderness over spinous process.  No neurologic deficit. -PT/OT -Home health recommended -Fall precaution   Underlying interstitial lung disease, questionable MAI on chest x-ray:   -Outpatient follow-up with pulmonology   Constipation -Continue MiraLAX.   Mild cognitive impairment - family report early signs of dementia. 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 -Continue home Seroquel at bedtime -Delirium precautions           Consultants: None Procedures performed: None Disposition: Home health Diet recommendation:  Discharge Diet Orders (From admission, onward)     Start     Ordered   03/08/22 0000  Diet - low sodium heart healthy         03/08/22 1122           DISCHARGE MEDICATION: Allergies as of 03/08/2022       Reactions   Clindamycin/lincomycin Hives   Prednisone Other (See Comments)   Psychosis   Amoxicillin Rash   Avelox [moxifloxacin Hcl In Nacl] Rash   Codeine Sulfate Nausea Only   Penicillins Rash   Sulfa Antibiotics Rash        Medication List     TAKE these medications    albuterol 108 (90 Base) MCG/ACT inhaler Commonly known as: VENTOLIN HFA Inhale 2 puffs into the lungs every 6 (six) hours as needed for wheezing or shortness of breath. What changed: Another medication with the same name was changed. Make sure you understand how and when to take each.   albuterol (2.5 MG/3ML) 0.083% nebulizer solution Commonly known as: PROVENTIL Take 3 mLs (2.5 mg total) by nebulization every 4 (four) hours as needed for wheezing or shortness of breath. What changed: when to take this   amiodarone 200 MG tablet Commonly known as: PACERONE Take 1 tablet (200 mg total) by mouth daily.   amLODipine 2.5 MG tablet Commonly known as: NORVASC Take 1 tablet (2.5 mg total) by mouth daily.   aspirin 81 MG tablet Take 81 mg by mouth daily.   atorvastatin 40 MG tablet Commonly known as: LIPITOR Take 1 tablet (40 mg total)  by mouth daily.   Blood Pressure Monitor/Wrist Kit Take Blood pressure as needed, Dx: I12.9   cefdinir 300 MG capsule Commonly known as: OMNICEF Take 1 capsule (300 mg total) by mouth 2 (two) times daily for 3 days.   CENTRUM SILVER ADULT 50+ PO Take 1 tablet by mouth daily.   PreserVision AREDS 2+Multi Vit Caps Take 1 capsule by mouth at bedtime.   cetirizine 10 MG tablet Commonly known as: ZYRTEC Take 10 mg by mouth daily as needed for allergies.   clopidogrel 75 MG tablet Commonly known as: PLAVIX Take 1 tablet (75 mg total) by mouth daily.   dextromethorphan-guaiFENesin 30-600 MG 12hr tablet Commonly known as: MUCINEX DM Take 1 tablet by mouth 2 (two) times daily for 10  days.   doxycycline 100 MG tablet Commonly known as: VIBRA-TABS Take 1 tablet (100 mg total) by mouth every 12 (twelve) hours for 3 days.   ferrous sulfate 325 (65 FE) MG tablet Take 1 tablet (325 mg total) by mouth daily with breakfast. Start taking on: March 09, 2022   fluticasone-salmeterol 250-50 MCG/ACT Aepb Commonly known as: Advair Diskus Inhale 1 puff into the lungs in the morning and at bedtime.   furosemide 20 MG tablet Commonly known as: LASIX Take 1 tablet (20 mg total) by mouth daily.   hydrALAZINE 25 MG tablet Commonly known as: APRESOLINE Take 0.5 tablets (12.5 mg total) by mouth 2 (two) times daily.   hydrochlorothiazide 12.5 MG capsule Commonly known as: MICROZIDE Take 1 capsule (12.5 mg total) by mouth daily as needed. What changed: reasons to take this   isosorbide mononitrate 60 MG 24 hr tablet Commonly known as: IMDUR Take 1 tablet (60 mg total) by mouth 2 (two) times daily.   losartan 25 MG tablet Commonly known as: COZAAR Take 1 tablet (25 mg total) by mouth daily.   metoprolol succinate 50 MG 24 hr tablet Commonly known as: TOPROL-XL Take 1 tablet (50 mg total) by mouth daily. Take with or immediately following a meal.   mirtazapine 45 MG tablet Commonly known as: REMERON Take 1 tablet (45 mg total) by mouth at bedtime.   nicotine 14 mg/24hr patch Commonly known as: NICODERM CQ - dosed in mg/24 hours Place 1 patch (14 mg total) onto the skin daily.   pantoprazole 40 MG tablet Commonly known as: Protonix Take 1 tablet (40 mg total) by mouth 2 (two) times daily.   polyethylene glycol powder 17 GM/SCOOP powder Commonly known as: GLYCOLAX/MIRALAX Take 17 g by mouth 3 (three) times daily.   predniSONE 10 MG tablet Commonly known as: DELTASONE Take 5 tablets (50 mg total) by mouth daily with breakfast for 2 days, THEN 4 tablets (40 mg total) daily with breakfast for 2 days, THEN 3 tablets (30 mg total) daily with breakfast for 2 days, THEN 2  tablets (20 mg total) daily with breakfast for 2 days, THEN 1 tablet (10 mg total) daily with breakfast for 2 days. Start taking on: March 08, 2022   QUEtiapine 50 MG Tb24 24 hr tablet Commonly known as: SEROquel XR Take 1 tablet (50 mg total) by mouth at bedtime.   saline Gel Place 1 Application into both nostrils every 4 (four) hours as needed.   tiotropium 18 MCG inhalation capsule Commonly known as: SPIRIVA Place 1 capsule (18 mcg total) into inhaler and inhale daily.        Follow-up Information     Erby Pian, MD. Go on 03/14/2022.   Specialty:  Specialist Why: Advanced COPD, appointment at 11:45 Contact information: [place] [address] [place], Jenkins, DO. Schedule an appointment as soon as possible for a visit.   Specialty: Family Medicine Why: Hospital follow up in 1-2 weeks, per office PT has follow up Contact information: [place] [address] [phone]                Discharge Exam: Filed Weights   03/07/22 0500 03/08/22 0226 03/08/22 0227  Weight: 49.1 kg 51.1 kg 50.1 kg   General exam: awake, alert, no acute distress, frail and chronically ill-appearing HEENT: wearing nasal cannula, moist mucus membranes, hearing grossly normal  Respiratory system: Improved air movement today, no expiratory wheezes, no accessory muscle use, on 3 L/min nasal cannula O2, normal respiratory effort. Cardiovascular system: normal S1/S2, RRR, no JVD, murmurs, rubs, gallops, no pedal edema.   Gastrointestinal system: soft, NT, ND, no HSM felt, +bowel sounds. Central nervous system: A&O x3. no gross focal neurologic deficits, normal speech Extremities: moves all, no edema, normal tone Skin: Stable and receding ecchymosis along her mid lower back/flank area more so on the left than right Psychiatry: normal mood, congruent affect, abnormal judgment and insight appears due to mild cognitive  impairment   Condition at discharge: stable  The results of significant diagnostics from this hospitalization (including imaging, microbiology, ancillary and laboratory) are listed below for reference.   Imaging Studies: DG Chest 2 View  Result Date: 03/06/2022 CLINICAL DATA:  Shortness of breath EXAM: CHEST - 2 VIEW COMPARISON:  01/13/2022, CT 01/14/2022, chest x-ray 09/11/2021 FINDINGS: Emphysema and chronic lung disease. Small left-sided pleural effusion, mildly diminished. Slight decreased right pleural effusion. Diffuse interstitial and ground-glass opacities suspicious for superimposed edema or interstitial inflammatory process. Stable cardiomediastinal silhouette with aortic atherosclerosis. No pneumothorax IMPRESSION: Emphysema and chronic lung disease. Small left and trace right pleural effusions appear improved. Diffuse interstitial and ground-glass opacities suspicious for superimposed edema or interstitial inflammatory process. Electronically Signed   By: Donavan Foil M.D.   On: 03/06/2022 19:07   DG Thoracic Spine W/Swimmers  Result Date: 03/06/2022 CLINICAL DATA:  Fall with bruising and pain EXAM: THORACIC SPINE - 3 VIEWS COMPARISON:  CT 01/14/2022 FINDINGS: Mild dextrocurvature. Bones appear osteopenic. Vertebral body heights are grossly maintained. Multilevel degenerative osteophyte IMPRESSION: Osteopenia and degenerative changes. No acute osseous abnormality. Electronically Signed   By: Donavan Foil M.D.   On: 03/06/2022 19:05   DG Lumbar Spine Complete  Result Date: 03/06/2022 CLINICAL DATA:  Fall with bruising and pain EXAM: LUMBAR SPINE - COMPLETE 4+ VIEW COMPARISON:  CT 01/14/2022 FINDINGS: Lumbar alignment is within normal limits. Vertebral body heights are maintained. Mild disc space narrowing L1-L2. Aortic atherosclerosis. Facet degenerative changes of the lumbar spine. IMPRESSION: Mild degenerative changes. No acute osseous abnormality. Electronically Signed   By: Donavan Foil M.D.   On: 03/06/2022 19:04   DG Chest Port 1 View  Result Date: 03/04/2022 CLINICAL DATA:  72 year old female with history of shortness of breath. EXAM: PORTABLE CHEST 1 VIEW COMPARISON:  Chest x-ray 03/03/2022. FINDINGS: Lung volumes are increased with emphysematous changes. Diffuse interstitial prominence, peribronchial cuffing and patchy ill-defined opacities scattered throughout the lungs bilaterally, increased slightly compared to the prior study. Small left and trace right pleural effusions. No pneumothorax. Heart size is normal. Upper mediastinal contours are within normal limits. Atherosclerotic calcifications in the thoracic aorta. IMPRESSION: 1. 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 2. Aortic atherosclerosis. Electronically Signed   By: Vinnie Langton M.D.   On: 03/04/2022 07:07    Microbiology: Results for orders placed or performed during the hospital encounter of 03/04/22  Resp panel by RT-PCR (RSV, Flu A&B, Covid) Anterior Nasal Swab     Status: None   Collection Time: 03/04/22  6:39 AM   Specimen: Anterior Nasal Swab  Result Value Ref Range Status   SARS Coronavirus 2 by RT PCR NEGATIVE NEGATIVE Final    Comment: (NOTE) SARS-CoV-2 target nucleic acids are NOT DETECTED.  The SARS-CoV-2 RNA is generally detectable in upper respiratory specimens during the acute phase of infection. The lowest concentration of SARS-CoV-2 viral copies this assay can detect is 138 copies/mL. A negative result does not preclude SARS-Cov-2 infection and should not be used as the sole basis for treatment or other patient management decisions. A negative  result may occur with  improper specimen collection/handling, submission of specimen other than nasopharyngeal swab, presence of viral mutation(s) within the areas targeted by this assay, and inadequate number of viral copies(<138 copies/mL). A negative result must be combined with clinical observations, patient history, and epidemiological information. The expected result is Negative.  Fact Sheet for Patients:  EntrepreneurPulse.com.au  Fact Sheet for Healthcare Providers:  IncredibleEmployment.be  This test is no t yet approved or cleared by the [place] FDA and  has been authorized for detection and/or diagnosis of SARS-CoV-2 by FDA under an Emergency Use Authorization (EUA). This EUA will remain  in effect (meaning this test can be used) for the duration of the COVID-19 declaration under Section 564(b)(1) of the Act, 21 U.S.C.section 360bbb-3(b)(1), unless the authorization is terminated  or revoked sooner.       Influenza A by PCR NEGATIVE NEGATIVE Final   Influenza B by PCR NEGATIVE NEGATIVE Final    Comment: (NOTE) The Xpert Xpress SARS-CoV-2/FLU/RSV plus assay is intended as an aid in the diagnosis of influenza from Nasopharyngeal swab specimens and should not be used as a sole basis for treatment. Nasal washings and aspirates are unacceptable for Xpert Xpress SARS-CoV-2/FLU/RSV testing.  Fact Sheet for Patients: EntrepreneurPulse.com.au  Fact Sheet for Healthcare Providers: IncredibleEmployment.be  This test is not yet approved or cleared by the [place] FDA and has been authorized for detection and/or diagnosis of SARS-CoV-2 by FDA under an Emergency Use Authorization (EUA). This EUA will remain in effect (meaning this test can be used) for the duration of the COVID-19 declaration under Section 564(b)(1) of the Act, 21 U.S.C. section 360bbb-3(b)(1), unless the authorization is  terminated or revoked.     Resp Syncytial Virus by PCR NEGATIVE NEGATIVE Final    Comment: (NOTE) Fact Sheet for Patients: EntrepreneurPulse.com.au  Fact Sheet for Healthcare Providers: IncredibleEmployment.be  This test is not yet approved or cleared by the [place] FDA and has been authorized for detection and/or diagnosis of SARS-CoV-2 by FDA under an Emergency Use Authorization (EUA). This EUA will remain in effect (meaning this test can be used) for the duration of the COVID-19 declaration under Section 564(b)(1) of the Act, 21 U.S.C. section 360bbb-3(b)(1), unless the authorization is terminated or revoked.  Performed at [facility], [address] [place], [place] [address]  Blood culture (routine single)     Status: None (Preliminary result)   Collection Time: 03/04/22  6:59 AM   Specimen: Right Antecubital; Blood  Result Value Ref Range Status   Specimen Description RIGHT ANTECUBITAL  Final   Special Requests   Final    BOTTLES DRAWN AEROBIC AND ANAEROBIC Blood Culture results may not be optimal due to an excessive volume of blood received in culture bottles   Culture   Final    NO GROWTH 4 DAYS Performed at [facility], [address] [place], [place] [address]    Report Status PENDING  Incomplete  Culture, blood (single)     Status: None (Preliminary result)   Collection Time: 03/04/22  7:35 AM   Specimen: BLOOD  Result Value Ref Range Status   Specimen Description BLOOD  RIGHT FOREARM  Final   Special Requests   Final    BOTTLES DRAWN AEROBIC AND ANAEROBIC Blood Culture adequate volume   Culture   Final    NO GROWTH 4 DAYS Performed at [facility], [address] [place], [place] [address]    Report Status PENDING  Incomplete  MRSA Next Gen by PCR, Nasal     Status: None   Collection Time: 03/04/22  9:20 AM   Specimen: Nasal Mucosa; Nasal Swab  Result Value Ref Range Status    MRSA by PCR Next Gen NOT DETECTED NOT DETECTED Final    Comment: (NOTE) The GeneXpert MRSA Assay (FDA approved for NASAL specimens only), is one component of a comprehensive MRSA colonization surveillance program. It is not intended to diagnose MRSA infection nor to guide or monitor treatment for MRSA infections. Test performance is not FDA approved in patients less than 23 years old. Performed at [facility], [address], [place], [place] [address]   Urine Culture     Status: None   Collection Time: 03/04/22  8:10 PM   Specimen: In/Out Cath Urine  Result Value Ref Range Status   Specimen Description   Final    IN/OUT CATH URINE Performed at [facility], [address], [place], [place] [address]    Special Requests   Final    NONE Performed at [facility], [address], [place], [place] [address]    Culture   Final    NO GROWTH Performed at [facility], [place] [address], [place], [place] [address]    Report Status 03/06/2022 FINAL  Final    Labs: CBC: Recent Labs  Lab 03/04/22 0639 03/05/22 0442 03/06/22 0413 03/07/22 0412 03/07/22 1914 03/08/22 0353  WBC 12.8* 6.0 7.6 7.2  --  6.7  NEUTROABS 8.2*  --   --   --   --   --   HGB 8.0* 8.0* 7.5* 6.9* 9.0* 8.9*  HCT 24.7* 24.7* 23.1* 21.0* 27.0* 26.9*  MCV 92.2 90.8 90.6 89.0  --  89.7  PLT 202 229 233 236  --  572   Basic Metabolic Panel: Recent Labs  Lab 03/04/22 0639 03/05/22 0442 03/06/22 0413  NA 133* 136 137  K 3.5 3.7 3.5  CL 98 100 101  CO2 _0 GLUCOSE 128* 135* 151*  BUN _1 CREATININE 0.57 0.65 0.65  CALCIUM 8.7* 9.3 8.9  MG  --   --  2.3  PHOS  --   --  2.9   Liver Function Tests: Recent Labs  Lab 03/04/22 0639 03/06/22 0413  AST 29  --  ALT 23  --   ALKPHOS 67  --   BILITOT 0.7  --   PROT 6.1*  --   ALBUMIN 3.0* 2.8*   CBG: No results for input(s): "GLUCAP" in the last 168 hours.  Discharge time spent: greater than 30  minutes.  Signed: Ezekiel Slocumb, DO [facility] 03/08/2022

## 2022-03-08 NOTE — Progress Notes (Signed)
Physical Therapy Treatment Patient Details Name: Kristi Clark MRN: [id number] DOB: June 15, 1950 Today's Date: 03/08/2022   History of Present Illness Kristi Clark is a 39yoF who comes to [facility] on 03/04/22 c acute respiratory distress. PMH: ILD, COPD, 3L chronic. Pt admitted with PNA sepsis. At baseline pt is a household AMB on O2, uses a 4WW, is fearful of falling.    PT Comments    Pt in chair on entry, meds on table in cup, pt reports difficulty swallowing has made her reluctant to take them- author provides apple sauce and pt administers meds to herself with success. Pt on 3L in entry/exit, bumped to 4L/min for session to meet exerted demands. Pt partakes in 3 AMB based balance interventions with seated recovery between. Her DOE is less provoked compared to 2 days prior. He sats get as low as 91% after efforts, but fully recover to 95% before her dyspnea recovers. Pt advancing but still not at baseline.   Recommendations for follow up therapy are one component of a multi-disciplinary discharge planning process, led by the attending physician.  Recommendations may be updated based on patient status, additional functional criteria and insurance authorization.  Follow Up Recommendations  Home health PT     Assistance Recommended at Discharge Intermittent Supervision/Assistance  Patient can return home with the following A little help with walking and/or transfers;A little help with bathing/dressing/bathroom;Assistance with cooking/housework;Help with stairs or ramp for entrance;Assist for transportation;Direct supervision/assist for medications management;Direct supervision/assist for financial management   Equipment Recommendations  None recommended by PT    Recommendations for Other Services       Precautions / Restrictions Precautions Precautions: Fall Restrictions Weight Bearing Restrictions: No     Mobility  Bed Mobility                    Transfers Overall  transfer level: Needs assistance Equipment used: Rolling walker (2 wheels), None Transfers: Sit to/from Stand Sit to Stand: Supervision                Ambulation/Gait Ambulation/Gait assistance: Supervision Gait Distance (Feet): 40 Feet (as part of exercises this session) Assistive device: Rolling walker (2 wheels) Gait Pattern/deviations: Step-to pattern           Stairs             Wheelchair Mobility    Modified Rankin (Stroke Patients Only)       Balance Overall balance assessment: History of Falls, No apparent balance deficits (not formally assessed), Modified Independent                                          Cognition Arousal/Alertness: Awake/alert Behavior During Therapy: WFL for tasks assessed/performed, Anxious Overall Cognitive Status: Within Functional Limits for tasks assessed                                          Exercises Other Exercises Other Exercises: alternate fwd, backward AMB in room c RW: 56f on 4L, -> seated recovery Other Exercises: side stepping [id number] BUE support on bed rail on 4L, -> seated recovery Other Exercises: 8x STS from recliner, RW ad lib, then stepping in place x16 on 4L -> seated recovery    General Comments  Pertinent Vitals/Pain      Home Living                          Prior Function            PT Goals (current goals can now be found in the care plan section) Acute Rehab PT Goals Patient Stated Goal: recover her activity tolerance PT Goal Formulation: With patient Time For Goal Achievement: 03/19/22 Potential to Achieve Goals: Good Progress towards PT goals: Progressing toward goals    Frequency    Min 2X/week      PT Plan Current plan remains appropriate    Co-evaluation              AM-PAC PT "6 Clicks" Mobility   Outcome Measure  Help needed turning from your back to your side while in a flat bed without using  bedrails?: A Little Help needed moving from lying on your back to sitting on the side of a flat bed without using bedrails?: A Little Help needed moving to and from a bed to a chair (including a wheelchair)?: A Little Help needed standing up from a chair using your arms (e.g., wheelchair or bedside chair)?: A Little Help needed to walk in hospital room?: A Little Help needed climbing 3-5 steps with a railing? : A Little 6 Click Score: 18    End of Session Equipment Utilized During Treatment: Oxygen Activity Tolerance: Patient tolerated treatment well;No increased pain Patient left: with call bell/phone within reach;in chair Nurse Communication: Mobility status PT Visit Diagnosis: Other abnormalities of gait and mobility (R26.89)     Time: 3491-7915 PT Time Calculation (min) (ACUTE ONLY): 30 min  Charges:  $Therapeutic Activity: 8-22 mins $Neuromuscular Re-education: 8-22 mins                    9:45 AM, 03/08/22 Etta Grandchild, PT, DPT Physical Therapist - [facility]  [phone] ([place])    [place] 03/08/2022, 9:42 AM

## 2022-03-09 ENCOUNTER — Ambulatory Visit: Payer: Self-pay | Admitting: *Deleted

## 2022-03-09 ENCOUNTER — Telehealth: Payer: Self-pay | Admitting: *Deleted

## 2022-03-09 LAB — CULTURE, BLOOD (SINGLE)
Culture: NO GROWTH
Culture: NO GROWTH
Special Requests: ADEQUATE

## 2022-03-09 NOTE — Patient Outreach (Signed)
  Care Coordination [facility] Note Transition Care Management Unsuccessful Follow-up Telephone Call  Date of discharge and from where:  [facility] [id number]  Attempts:  1st Attempt  Reason for unsuccessful TCM follow-up call:  Left voice message  [place] Management [phone]

## 2022-03-09 NOTE — Telephone Encounter (Signed)
Reason for Disposition  [1] MODERATE leg swelling (e.g., swelling extends up to knees) AND [2] new-onset or worsening  Answer Assessment - Initial Assessment Questions 1. ONSET: "When did the swelling start?" (e.g., minutes, hours, days)     Having swelling in both legs.   Right is worse. Swelling started last night.   This morning it's worse. She has a weird indention above her knee. 2. LOCATION: "What part of the leg is swollen?"  "Are both legs swollen or just one leg?"     Right leg from foot to knee is burning.   She just got out of the hospital.    She has dementia.    Color and temperature are the same in her legs.    No blood clots in legs.        3. SEVERITY: "How bad is the swelling?" (e.g., localized; mild, moderate, severe)   - Localized: Small area of swelling localized to one leg.   - MILD pedal edema: Swelling limited to foot and ankle, pitting edema < 1/4 inch (6 mm) deep, rest and elevation eliminate most or all swelling.   - MODERATE edema: Swelling of lower leg to knee, pitting edema > 1/4 inch (6 mm) deep, rest and elevation only partially reduce swelling.   - SEVERE edema: Swelling extends above knee, facial or hand swelling present.      Swelling right leg is knee down, the thigh maybe a little swollen.   Left leg swelling is very little.    4. REDNESS: "Does the swelling look red or infected?"     No redness or open wounds.    No diabetes. 5. PAIN: "Is the swelling painful to touch?" If Yes, ask: "How painful is it?"   (Scale 1-10; mild, moderate or severe)     No pain.    Just the burning in her foot to her knee on right leg.   6. FEVER: "Do you have a fever?" If Yes, ask: "What is it, how was it measured, and when did it start?"      No fever Just got out of hospital yesterday.   She is urinating fine.   She did have a urinary catheter in hospital.    She has urinated at least once today.    She took her fluid pill at 9:15 am today. 7. CAUSE: "What do you think is  causing the leg swelling?"     She has COPD real bad.   She's on 3 L O2.    She was in the hospital for pneumonia and COPD flare up.     8. MEDICAL HISTORY: "Do you have a history of blood clots (e.g., DVT), cancer, heart failure, kidney disease, or liver failure?"     No 9. RECURRENT SYMPTOM: "Have you had leg swelling before?" If Yes, ask: "When was the last time?" "What happened that time?"     Yes       They had her on HCTZ but changed her to Lasix but then she went into the hospital the next day. 10. OTHER SYMPTOMS: "Do you have any other symptoms?" (e.g., chest pain, difficulty breathing)       No 11. PREGNANCY: "Is there any chance you are pregnant?" "When was your last menstrual period?"       N/A due to age  Protocols used: Leg Swelling and Edema-A-AH  Chief Complaint: Right leg is swollen with burning from knee to foot.  Just got out of the hospital  yesterday with COPD flare up Symptoms: Swelling to above the knee with pitting noted  Frequency: Since last night.   She slept with her legs elevated last night but they are more swollen this morning. Pertinent Negatives: Patient denies shortness of breath, chest tightness, no open wounds, legs equal temperature.  Coloring is equal both legs. Disposition: '[]'$ ED /'[]'$ Urgent Care (no appt availability in office) / '[x]'$ Appointment(In office/virtual)/ '[]'$  [place] [facility]/ '[]'$ Home Care/ '[]'$ Refused Recommended Disposition /'[]'$ [place] [facility]/ '[]'$  Follow-up with PCP Additional Notes: Appt. Made with Talitha Givens, PA-C for 03/10/2022.    Instructed to take her to the ED if she develops shortness of breath or chest pain or the leg burning/pain becomes worse.    I spoke with her daughter but mother in the background.   She has dementia so daughter gave me the information during the triage assessment.

## 2022-03-10 ENCOUNTER — Ambulatory Visit (INDEPENDENT_AMBULATORY_CARE_PROVIDER_SITE_OTHER): Payer: Medicare Other | Admitting: Physician Assistant

## 2022-03-10 ENCOUNTER — Encounter: Payer: Self-pay | Admitting: Physician Assistant

## 2022-03-10 VITALS — BP 140/48 | HR 68 | Temp 98.7°F

## 2022-03-10 DIAGNOSIS — I771 Stricture of artery: Secondary | ICD-10-CM | POA: Diagnosis not present

## 2022-03-10 DIAGNOSIS — B370 Candidal stomatitis: Secondary | ICD-10-CM

## 2022-03-10 DIAGNOSIS — R634 Abnormal weight loss: Secondary | ICD-10-CM | POA: Diagnosis not present

## 2022-03-10 DIAGNOSIS — M7989 Other specified soft tissue disorders: Secondary | ICD-10-CM

## 2022-03-10 DIAGNOSIS — R6881 Early satiety: Secondary | ICD-10-CM | POA: Diagnosis not present

## 2022-03-10 MED ORDER — NYSTATIN 100000 UNIT/ML MT SUSP: 5.0000 mL | Freq: Four times a day (QID) | OROMUCOSAL | 0 refills | Status: DC

## 2022-03-10 NOTE — Progress Notes (Signed)
Established Patient Office Visit  Name: Kristi Clark   MRN: [id number]    DOB: 01-Jan-1951   Date:03/14/2022  Today's Provider: Talitha Givens, MHS, PA-C Introduced myself to the patient as a PA-C and provided education on APPs in clinical practice.         Subjective  Chief Complaint  Chief Complaint  Patient presents with   Leg Swelling    B/L leg swelling with burning from the Right knee to the foot.    [name]   Fall      She is here with her daughter who is assisting with HPI  Leg swelling States her right leg started swelling since she got out of the hospital  She is worried she is taking the wrong medications Her daughter states she has been taking her Lasix daily Her daughter states that the patient told her her foot was burning and was burning up to her knee  Daughter reports that now her left leg is starting to feel the burning sensation  Thrush  Daughter reports concerns for thrush at this time States patient has had white spots on tongue for past week or so and they do not have any mouth wash for this   Fall  Fell Dec 21st  She wasn't seen for this until after Christmas as she left clinic before being seen  Unsure of details of fall due to tangential and rambling nature of patient's accounting of events    Hospice  She is concerned because this has to go through [place]? A representative from Hospice came to the house but she turned the representative away She is concerned because she would have to stop PT and does not want to stop progress  Her daughter reports she thinks she needs to increase dose of Quetiapine   Patient Active Problem List   Diagnosis Date Noted   Paroxysmal A-fib ([place]) 03/05/2022   Fall at home, initial encounter 03/05/2022   History of CAD (coronary artery disease) 03/05/2022   Chronic diastolic CHF (congestive heart failure) ([place]) 03/05/2022   DNR (do not resuscitate) 03/05/2022   Acute on chronic respiratory failure  with hypoxemia ([place]) 03/04/2022   Hypotension due to drugs 02/10/2022   Confusion 02/10/2022   Depression, recurrent ([place]) 02/02/2022   Normocytic anemia 01/13/2022   Atrial fibrillation with RVR ([place]) 01/13/2022   Bilateral pneumonia 11/23/2021   Chronic respiratory failure with hypoxia ([place]) 09/21/2021   Valvular vegetation 05/06/2021   Malnutrition of moderate degree 04/29/2021   Unintentional weight loss 04/28/2021   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 ([place]) 03/15/2021   Aortic atherosclerosis ([place]) 05/27/2020   COPD with acute exacerbation ([place]) 02/17/2020   Postherpetic neuralgia 11/24/2019   Nicotine dependence, cigarettes, w unsp disorders 10/17/2017   IBS (irritable bowel syndrome) 04/04/2017   Goals of care, counseling/discussion 10/03/2016   CAD (coronary artery disease) 10/03/2016   Iron deficiency anemia 10/03/2016   Essential hypertension 03/06/2015   Allergic rhinitis 09/03/2014   GERD (gastroesophageal reflux disease) 09/03/2014   Hyperlipidemia 09/03/2014   COPD, severe ([place]) 08/08/2013   Pulmonary nodules 08/08/2013    Past Surgical History:  Procedure Laterality Date   CARDIAC CATHETERIZATION     CHOLECYSTECTOMY  2014   COLONOSCOPY WITH PROPOFOL N/A 01/16/2018   Procedure: COLONOSCOPY WITH PROPOFOL;  Surgeon: Lollie Sails, MD;  Location: [facility] ENDOSCOPY;  Service: Endoscopy;  Laterality: N/A;   ESOPHAGOGASTRODUODENOSCOPY (EGD) WITH PROPOFOL N/A 01/16/2018  Procedure: ESOPHAGOGASTRODUODENOSCOPY (EGD) WITH PROPOFOL;  Surgeon: Lollie Sails, MD;  Location: [facility] ENDOSCOPY;  Service: Endoscopy;  Laterality: N/A;   heart stent     LEFT HEART CATH AND CORONARY ANGIOGRAPHY N/A 09/17/2021   Procedure: LEFT HEART CATH AND CORONARY ANGIOGRAPHY;  Surgeon: Dionisio David, MD;  Location: [place] CV LAB;  Service: Cardiovascular;  Laterality: N/A;   TEE WITHOUT CARDIOVERSION N/A 05/11/2021   Procedure:  TRANSESOPHAGEAL ECHOCARDIOGRAM (TEE);  Surgeon: Corey Skains, MD;  Location: [facility] ORS;  Service: Cardiovascular;  Laterality: N/A;   TOTAL ABDOMINAL HYSTERECTOMY      Family History  Problem Relation Age of Onset   Cancer Mother    Stroke Mother    Heart disease Father    Hyperlipidemia Father    Cancer Sister    Diabetes Sister    Breast cancer Sister 86   Diabetes Brother    Asthma Son    Cancer Son    Diabetes Daughter    Hypertension Daughter    Cancer Maternal Grandmother        gallbladder   Diabetes Brother    Heart disease Brother    Breast cancer Paternal Aunt     Social History   Tobacco Use   Smoking status: Former    Packs/day: 1.00    Years: 56.00    Total pack years: 56.00    Types: Cigarettes   Smokeless tobacco: Never  Substance Use Topics   Alcohol use: No     Current Outpatient Medications:    albuterol (PROVENTIL) (2.5 MG/3ML) 0.083% nebulizer solution, Take 3 mLs (2.5 mg total) by nebulization every 4 (four) hours as needed for wheezing or shortness of breath., Disp: 150 mL, Rfl: 1   albuterol (VENTOLIN HFA) 108 (90 Base) MCG/ACT inhaler, Inhale 2 puffs into the lungs every 6 (six) hours as needed for wheezing or shortness of breath., Disp: 18 g, Rfl: 3   amLODipine (NORVASC) 2.5 MG tablet, Take 1 tablet (2.5 mg total) by mouth daily., Disp: 30 tablet, Rfl: 3   aspirin 81 MG tablet, Take 81 mg by mouth daily., Disp: , Rfl:    atorvastatin (LIPITOR) 40 MG tablet, Take 1 tablet (40 mg total) by mouth daily., Disp: 90 tablet, Rfl: 1   Blood Pressure Monitoring (BLOOD PRESSURE MONITOR/WRIST) KIT, Take Blood pressure as needed, Dx: I12.9, Disp: 1 kit, Rfl: 0   cetirizine (ZYRTEC) 10 MG tablet, Take 10 mg by mouth daily as needed for allergies., Disp: , Rfl:    clopidogrel (PLAVIX) 75 MG tablet, Take 1 tablet (75 mg total) by mouth daily., Disp: 90 tablet, Rfl: 0   dextromethorphan-guaiFENesin (MUCINEX DM) 30-600 MG 12hr tablet, Take 1 tablet by  mouth 2 (two) times daily for 10 days., Disp: 20 tablet, Rfl: 0   furosemide (LASIX) 20 MG tablet, Take 1 tablet (20 mg total) by mouth daily., Disp: 5 tablet, Rfl: 0   hydrALAZINE (APRESOLINE) 25 MG tablet, Take 0.5 tablets (12.5 mg total) by mouth 2 (two) times daily., Disp: 90 tablet, Rfl: 1   isosorbide mononitrate (IMDUR) 60 MG 24 hr tablet, Take 1 tablet (60 mg total) by mouth 2 (two) times daily., Disp: 90 tablet, Rfl: 1   losartan (COZAAR) 25 MG tablet, Take 1 tablet (25 mg total) by mouth daily., Disp: 90 tablet, Rfl: 1   metoprolol succinate (TOPROL-XL) 50 MG 24 hr tablet, Take 1 tablet (50 mg total) by mouth daily. Take with or immediately following a meal., Disp: 90 tablet,  Rfl: 0   mirtazapine (REMERON) 45 MG tablet, Take 1 tablet (45 mg total) by mouth at bedtime., Disp: 90 tablet, Rfl: 1   Multiple Vitamins-Minerals (CENTRUM SILVER ADULT 50+ PO), Take 1 tablet by mouth daily., Disp: , Rfl:    Multiple Vitamins-Minerals (PRESERVISION AREDS 2+MULTI VIT) CAPS, Take 1 capsule by mouth at bedtime., Disp: , Rfl:    nicotine (NICODERM CQ - DOSED IN MG/24 HOURS) 14 mg/24hr patch, Place 1 patch (14 mg total) onto the skin daily., Disp: 28 patch, Rfl: 3   nystatin (MYCOSTATIN) 100000 UNIT/ML suspension, Take 5 mLs (500,000 Units total) by mouth 4 (four) times daily., Disp: 120 mL, Rfl: 0   pantoprazole (PROTONIX) 40 MG tablet, Take 1 tablet (40 mg total) by mouth 2 (two) times daily., Disp: 60 tablet, Rfl: 11   polyethylene glycol powder (GLYCOLAX/MIRALAX) 17 GM/SCOOP powder, Take 17 g by mouth 3 (three) times daily., Disp: 3350 g, Rfl: 3   predniSONE (DELTASONE) 10 MG tablet, Take 5 tablets (50 mg total) by mouth daily with breakfast for 2 days, THEN 4 tablets (40 mg total) daily with breakfast for 2 days, THEN 3 tablets (30 mg total) daily with breakfast for 2 days, THEN 2 tablets (20 mg total) daily with breakfast for 2 days, THEN 1 tablet (10 mg total) daily with breakfast for 2 days., Disp: 30  tablet, Rfl: 0   QUEtiapine (SEROQUEL XR) 50 MG TB24 24 hr tablet, Take 1 tablet (50 mg total) by mouth at bedtime., Disp: 30 tablet, Rfl: 1   saline (AYR) GEL, Place 1 Application into both nostrils every 4 (four) hours as needed., Disp: 14.1 g, Rfl: 6   tiotropium (SPIRIVA) 18 MCG inhalation capsule, Place 1 capsule (18 mcg total) into inhaler and inhale daily., Disp: 90 capsule, Rfl: 3   amiodarone (PACERONE) 200 MG tablet, Take 1 tablet (200 mg total) by mouth daily., Disp: 30 tablet, Rfl: 0   ferrous sulfate 325 (65 FE) MG tablet, Take 1 tablet (325 mg total) by mouth daily with breakfast. (Patient not taking: Reported on 03/10/2022), Disp: 30 tablet, Rfl: 3   fluticasone-salmeterol (ADVAIR DISKUS) 250-50 MCG/ACT AEPB, Inhale 1 puff into the lungs in the morning and at bedtime. (Patient not taking: Reported on 03/10/2022), Disp: 60 each, Rfl: 3   hydrochlorothiazide (MICROZIDE) 12.5 MG capsule, Take 1 capsule (12.5 mg total) by mouth daily as needed. (Patient not taking: Reported on 03/10/2022), Disp: 30 capsule, Rfl: 3  Current Facility-Administered Medications:    triamcinolone acetonide (KENALOG-40) injection 40 mg, 40 mg, Intramuscular, Once, Johnson, Megan P, DO  Facility-Administered Medications Ordered in Other Visits:    sodium chloride flush (NS) 0.9 % injection 3 mL, 3 mL, Intravenous, Q12H, Scoggins, Amber, NP  Allergies  Allergen Reactions   Clindamycin/Lincomycin Hives   Prednisone Other (See Comments)    Psychosis   Amoxicillin Rash   Avelox [Moxifloxacin Hcl In Nacl] Rash   Codeine Sulfate Nausea Only   Penicillins Rash   Sulfa Antibiotics Rash    I personally reviewed active problem list, medication list, allergies, notes from last encounter, lab results with the patient/caregiver today.   Review of Systems  Respiratory:  Positive for shortness of breath.   Cardiovascular:  Positive for leg swelling.      Objective  Vitals:   03/10/22 1446  BP: (!) 140/48   Pulse: 68  Temp: 98.7 F (37.1 C)  TempSrc: Oral  SpO2: 96%    There is no height or weight on file  to calculate BMI.  Physical Exam Vitals reviewed.  Constitutional:      General: She is awake.     Appearance: Normal appearance. She is well-groomed. She is cachectic.  HENT:     Head: Normocephalic and atraumatic.     Mouth/Throat:     Mouth: Mucous membranes are moist.     Dentition: Has dentures.     Tongue: No lesions.     Comments: White plaque on tongue present  Cardiovascular:     Rate and Rhythm: Normal rate and regular rhythm.     Heart sounds: Normal heart sounds. No murmur heard.    No friction rub. No gallop.     Comments: 2+ pitting edema to level of knee on right leg and to mid-shin on left leg  Pulmonary:     Effort: Accessory muscle usage and prolonged expiration present.     Breath sounds: Decreased breath sounds present. No wheezing, rhonchi or rales.     Comments: Patient is on continuous O2   Lungs sound hyperinflated and hollow  Musculoskeletal:     Cervical back: Normal range of motion and neck supple.     Right lower leg: 2+ Edema present.     Left lower leg: 2+ Edema present.  Neurological:     Mental Status: She is alert.  Psychiatric:        Attention and Perception: She is inattentive.        Mood and Affect: Mood and affect normal.        Speech: Speech is tangential.        Behavior: Behavior is cooperative.         PHQ2/9:    03/03/2022    2:12 PM 02/21/2022    1:43 PM 02/10/2022    3:20 PM 02/02/2022    1:34 PM 01/12/2022   10:46 AM  Depression screen PHQ 2/9  Decreased Interest _0 Down, Depressed, Hopeless _1 PHQ - 2 Score _2 Altered sleeping _3 Tired, decreased energy _4 Change in appetite 0 _5 Feeling bad or failure about yourself  _6 Trouble concentrating _7 Moving slowly or fidgety/restless 1 0 3 0 3  Suicidal thoughts 0 0 1 0 0  PHQ-9 Score _8 Difficult doing work/chores Somewhat difficult Somewhat difficult Extremely dIfficult Very difficult       Fall Risk:    03/03/2022    2:12 PM 02/21/2022    1:43 PM 02/10/2022    3:18 PM 02/02/2022    1:34 PM 01/12/2022   10:46 AM  [place] in the past year? 1 0 0 0 0  Number falls in past yr: 0 0 0 0 0  Injury with Fall? 1 0 0 0 0  Risk for fall due to : History of fall(s) No Fall Risks No Fall Risks Impaired balance/gait;Impaired mobility No Fall Risks  Follow up _9       Functional Status Survey:      Assessment & Plan  Problem List Items Addressed This Visit   None Visit Diagnoses     Leg swelling    -  Primary Acute, ongoing concern  Reports leg swelling since  dc from hospital  She is prescribed HCTZ and Lasix but there is some confusion regarding medications and compliance with regimen Her lungs are overall clear to auscultation and no new murmurs on cardiac auscultation Recommend using compression stockings at this time to assist with lower leg edema - reviewed sizing and where to obtain with patient and her daughter Recommend follow up with PCP to discuss response to these measures.     Thrush, oral     Acute, recurrent  Generalized white plaques on tongue appear consistent with thrust Will send in script for Nystatin suspension for her to swish and spit for resolution Reviewed rinsing mouth with water after using inhalers and using Biot[place] mouth wash to assist with oral hydration for prevention of further thrush  Follow up as needed     Relevant Medications   nystatin (MYCOSTATIN) 100000 UNIT/ML suspension        No follow-ups on file.   I, Niala Stcharles E Lior Cartelli, PA-C, have reviewed all documentation for this visit. The documentation on 03/14/22 for the exam, diagnosis, procedures, and orders are all accurate and  complete.   Talitha Givens, MHS, PA-C [facility]

## 2022-03-11 DIAGNOSIS — I130 Hypertensive heart and chronic kidney disease with heart failure and stage 1 through stage 4 chronic kidney disease, or unspecified chronic kidney disease: Secondary | ICD-10-CM | POA: Diagnosis not present

## 2022-03-11 DIAGNOSIS — I509 Heart failure, unspecified: Secondary | ICD-10-CM | POA: Diagnosis not present

## 2022-03-11 DIAGNOSIS — J9621 Acute and chronic respiratory failure with hypoxia: Secondary | ICD-10-CM | POA: Diagnosis not present

## 2022-03-11 DIAGNOSIS — N182 Chronic kidney disease, stage 2 (mild): Secondary | ICD-10-CM | POA: Diagnosis not present

## 2022-03-11 DIAGNOSIS — J441 Chronic obstructive pulmonary disease with (acute) exacerbation: Secondary | ICD-10-CM | POA: Diagnosis not present

## 2022-03-11 DIAGNOSIS — D631 Anemia in chronic kidney disease: Secondary | ICD-10-CM | POA: Diagnosis not present

## 2022-03-12 DIAGNOSIS — E440 Moderate protein-calorie malnutrition: Secondary | ICD-10-CM | POA: Diagnosis not present

## 2022-03-12 DIAGNOSIS — R296 Repeated falls: Secondary | ICD-10-CM | POA: Diagnosis not present

## 2022-03-12 DIAGNOSIS — F32A Depression, unspecified: Secondary | ICD-10-CM | POA: Diagnosis not present

## 2022-03-12 DIAGNOSIS — N182 Chronic kidney disease, stage 2 (mild): Secondary | ICD-10-CM | POA: Diagnosis not present

## 2022-03-12 DIAGNOSIS — I5032 Chronic diastolic (congestive) heart failure: Secondary | ICD-10-CM | POA: Diagnosis not present

## 2022-03-12 DIAGNOSIS — J439 Emphysema, unspecified: Secondary | ICD-10-CM | POA: Diagnosis not present

## 2022-03-12 DIAGNOSIS — R1310 Dysphagia, unspecified: Secondary | ICD-10-CM | POA: Diagnosis not present

## 2022-03-12 DIAGNOSIS — J849 Interstitial pulmonary disease, unspecified: Secondary | ICD-10-CM | POA: Diagnosis not present

## 2022-03-12 DIAGNOSIS — I2510 Atherosclerotic heart disease of native coronary artery without angina pectoris: Secondary | ICD-10-CM | POA: Diagnosis not present

## 2022-03-12 DIAGNOSIS — I700 Atherosclerosis of aorta: Secondary | ICD-10-CM | POA: Diagnosis not present

## 2022-03-12 DIAGNOSIS — R634 Abnormal weight loss: Secondary | ICD-10-CM | POA: Diagnosis not present

## 2022-03-12 DIAGNOSIS — J309 Allergic rhinitis, unspecified: Secondary | ICD-10-CM | POA: Diagnosis not present

## 2022-03-12 DIAGNOSIS — I130 Hypertensive heart and chronic kidney disease with heart failure and stage 1 through stage 4 chronic kidney disease, or unspecified chronic kidney disease: Secondary | ICD-10-CM | POA: Diagnosis not present

## 2022-03-12 DIAGNOSIS — F17219 Nicotine dependence, cigarettes, with unspecified nicotine-induced disorders: Secondary | ICD-10-CM | POA: Diagnosis not present

## 2022-03-12 DIAGNOSIS — R413 Other amnesia: Secondary | ICD-10-CM | POA: Diagnosis not present

## 2022-03-12 DIAGNOSIS — R911 Solitary pulmonary nodule: Secondary | ICD-10-CM | POA: Diagnosis not present

## 2022-03-12 DIAGNOSIS — R54 Age-related physical debility: Secondary | ICD-10-CM | POA: Diagnosis not present

## 2022-03-12 DIAGNOSIS — J189 Pneumonia, unspecified organism: Secondary | ICD-10-CM | POA: Diagnosis not present

## 2022-03-12 DIAGNOSIS — E871 Hypo-osmolality and hyponatremia: Secondary | ICD-10-CM | POA: Diagnosis not present

## 2022-03-12 DIAGNOSIS — D631 Anemia in chronic kidney disease: Secondary | ICD-10-CM | POA: Diagnosis not present

## 2022-03-12 DIAGNOSIS — K219 Gastro-esophageal reflux disease without esophagitis: Secondary | ICD-10-CM | POA: Diagnosis not present

## 2022-03-12 DIAGNOSIS — E785 Hyperlipidemia, unspecified: Secondary | ICD-10-CM | POA: Diagnosis not present

## 2022-03-12 DIAGNOSIS — G3184 Mild cognitive impairment, so stated: Secondary | ICD-10-CM | POA: Diagnosis not present

## 2022-03-12 DIAGNOSIS — I480 Paroxysmal atrial fibrillation: Secondary | ICD-10-CM | POA: Diagnosis not present

## 2022-03-12 DIAGNOSIS — J9621 Acute and chronic respiratory failure with hypoxia: Secondary | ICD-10-CM | POA: Diagnosis not present

## 2022-03-14 ENCOUNTER — Ambulatory Visit: Payer: Medicare Other | Admitting: Family Medicine

## 2022-03-14 ENCOUNTER — Telehealth: Payer: Self-pay | Admitting: *Deleted

## 2022-03-14 DIAGNOSIS — Z9981 Dependence on supplemental oxygen: Secondary | ICD-10-CM | POA: Diagnosis not present

## 2022-03-14 DIAGNOSIS — R0602 Shortness of breath: Secondary | ICD-10-CM | POA: Diagnosis not present

## 2022-03-14 DIAGNOSIS — J189 Pneumonia, unspecified organism: Secondary | ICD-10-CM | POA: Diagnosis not present

## 2022-03-14 DIAGNOSIS — J470 Bronchiectasis with acute lower respiratory infection: Secondary | ICD-10-CM | POA: Diagnosis not present

## 2022-03-14 DIAGNOSIS — J449 Chronic obstructive pulmonary disease, unspecified: Secondary | ICD-10-CM | POA: Diagnosis not present

## 2022-03-14 NOTE — Patient Outreach (Signed)
  Care Coordination [facility] Note Transition Care Management Unsuccessful Follow-up Telephone Call  Date of discharge and from where:  [facility] [phone]  Attempts:  2nd Attempt  Reason for unsuccessful TCM follow-up call:  Left voice message [place] Management [phone]

## 2022-03-15 ENCOUNTER — Encounter: Payer: Self-pay | Admitting: Family Medicine

## 2022-03-15 ENCOUNTER — Ambulatory Visit (INDEPENDENT_AMBULATORY_CARE_PROVIDER_SITE_OTHER): Payer: Medicare Other | Admitting: Family Medicine

## 2022-03-15 ENCOUNTER — Telehealth: Payer: Self-pay | Admitting: *Deleted

## 2022-03-15 VITALS — BP 144/40 | HR 65 | Temp 98.1°F | Ht <= 58 in | Wt 99.9 lb

## 2022-03-15 DIAGNOSIS — I10 Essential (primary) hypertension: Secondary | ICD-10-CM

## 2022-03-15 DIAGNOSIS — I700 Atherosclerosis of aorta: Secondary | ICD-10-CM

## 2022-03-15 DIAGNOSIS — J9621 Acute and chronic respiratory failure with hypoxia: Secondary | ICD-10-CM

## 2022-03-15 DIAGNOSIS — A419 Sepsis, unspecified organism: Secondary | ICD-10-CM | POA: Diagnosis not present

## 2022-03-15 DIAGNOSIS — F339 Major depressive disorder, recurrent, unspecified: Secondary | ICD-10-CM

## 2022-03-15 DIAGNOSIS — R0602 Shortness of breath: Secondary | ICD-10-CM

## 2022-03-15 DIAGNOSIS — J189 Pneumonia, unspecified organism: Secondary | ICD-10-CM | POA: Diagnosis not present

## 2022-03-15 DIAGNOSIS — J449 Chronic obstructive pulmonary disease, unspecified: Secondary | ICD-10-CM | POA: Diagnosis not present

## 2022-03-15 DIAGNOSIS — R300 Dysuria: Secondary | ICD-10-CM | POA: Diagnosis not present

## 2022-03-15 DIAGNOSIS — R6520 Severe sepsis without septic shock: Secondary | ICD-10-CM | POA: Diagnosis not present

## 2022-03-15 LAB — URINALYSIS, ROUTINE W REFLEX MICROSCOPIC
Bilirubin, UA: NEGATIVE
Glucose, UA: NEGATIVE
Ketones, UA: NEGATIVE
Leukocytes,UA: NEGATIVE
Nitrite, UA: NEGATIVE
RBC, UA: NEGATIVE
Specific Gravity, UA: 1.01 (ref 1.005–1.030)
Urobilinogen, Ur: 0.2 mg/dL (ref 0.2–1.0)
pH, UA: 7 (ref 5.0–7.5)

## 2022-03-15 LAB — WET PREP FOR TRICH, YEAST, CLUE
Clue Cell Exam: NEGATIVE
Trichomonas Exam: NEGATIVE
Yeast Exam: NEGATIVE

## 2022-03-15 MED ORDER — QUETIAPINE FUMARATE ER 50 MG PO TB24: 100.0000 mg | ORAL_TABLET | Freq: Every day | ORAL | 1 refills | Status: DC

## 2022-03-15 NOTE — Progress Notes (Signed)
BP (!) 144/40   Pulse 65   Temp 98.1 F (36.7 C) (Oral)   Ht '4\' 4"'$  (1.321 m)   Wt 99 lb 14.4 oz (45.3 kg)   LMP  (LMP Unknown)   SpO2 91%   BMI 25.98 kg/m    Subjective:    Patient ID: Kristi Clark, female    DOB: May 05, 1950, 72 y.o.   MRN: [id number]  HPI: Kristi Clark is a 72 y.o. female  Chief Complaint  Patient presents with   Hospitalization Follow-up   Transition of [facility] Follow up.   Hospital/Facility: [facility] D/C Physician: Dr. Arbutus Ped D/C Date: 03/08/22  Records Requested: 03/15/22 Records Received: 03/15/22 Records Reviewed: 03/15/22  Diagnoses on Discharge:   Acute on chronic respiratory failure with hypoxemia ([place])   COPD with acute exacerbation ([place])   Essential hypertension   Bilateral pneumonia   Normocytic anemia   Paroxysmal A-fib ([place])   Fall at home, initial encounter   History of CAD (coronary artery disease)   Chronic diastolic CHF (congestive heart failure) ([place])   DNR (do not resuscitate)   Resolved Problems:   Severe sepsis ([place])  Date of interactive Contact within 48 hours of discharge: 03/09/22, 03/14/22, 03/15/22 Contact was through: phone  Date of 7 day or 14 day face-to-face visit:  03/15/22  within 7 days  Outpatient Encounter Medications as of 03/15/2022  Medication Sig Note   albuterol (PROVENTIL) (2.5 MG/3ML) 0.083% nebulizer solution Take 3 mLs (2.5 mg total) by nebulization every 4 (four) hours as needed for wheezing or shortness of breath.    albuterol (VENTOLIN HFA) 108 (90 Base) MCG/ACT inhaler Inhale 2 puffs into the lungs every 6 (six) hours as needed for wheezing or shortness of breath.    amLODipine (NORVASC) 2.5 MG tablet Take 1 tablet (2.5 mg total) by mouth daily.    aspirin 81 MG tablet Take 81 mg by mouth daily.    atorvastatin (LIPITOR) 40 MG tablet Take 1 tablet (40 mg total) by mouth daily.    Blood Pressure Monitoring (BLOOD PRESSURE MONITOR/WRIST) KIT Take Blood pressure as needed, Dx: I12.9    cetirizine  (ZYRTEC) 10 MG tablet Take 10 mg by mouth daily as needed for allergies.    clopidogrel (PLAVIX) 75 MG tablet Take 1 tablet (75 mg total) by mouth daily.    [EXPIRED] dextromethorphan-guaiFENesin (MUCINEX DM) 30-600 MG 12hr tablet Take 1 tablet by mouth 2 (two) times daily for 10 days.    ferrous sulfate 325 (65 FE) MG tablet Take 1 tablet (325 mg total) by mouth daily with breakfast.    fluticasone-salmeterol (ADVAIR DISKUS) 250-50 MCG/ACT AEPB Inhale 1 puff into the lungs in the morning and at bedtime.    furosemide (LASIX) 20 MG tablet Take 1 tablet (20 mg total) by mouth daily. 03/04/2022: New medication, not started yet   hydrALAZINE (APRESOLINE) 25 MG tablet Take 0.5 tablets (12.5 mg total) by mouth 2 (two) times daily.    hydrochlorothiazide (MICROZIDE) 12.5 MG capsule Take 1 capsule (12.5 mg total) by mouth daily as needed.    isosorbide mononitrate (IMDUR) 60 MG 24 hr tablet Take 1 tablet (60 mg total) by mouth 2 (two) times daily.    losartan (COZAAR) 25 MG tablet Take 1 tablet (25 mg total) by mouth daily.    metoprolol succinate (TOPROL-XL) 50 MG 24 hr tablet Take 1 tablet (50 mg total) by mouth daily. Take with or immediately following a meal.    mirtazapine (REMERON) 45 MG  tablet Take 1 tablet (45 mg total) by mouth at bedtime.    Multiple Vitamins-Minerals (CENTRUM SILVER ADULT 50+ PO) Take 1 tablet by mouth daily.    Multiple Vitamins-Minerals (PRESERVISION AREDS 2+MULTI VIT) CAPS Take 1 capsule by mouth at bedtime.    nicotine (NICODERM CQ - DOSED IN MG/24 HOURS) 14 mg/24hr patch Place 1 patch (14 mg total) onto the skin daily.    pantoprazole (PROTONIX) 40 MG tablet Take 1 tablet (40 mg total) by mouth 2 (two) times daily.    polyethylene glycol powder (GLYCOLAX/MIRALAX) 17 GM/SCOOP powder Take 17 g by mouth 3 (three) times daily.    [EXPIRED] predniSONE (DELTASONE) 10 MG tablet Take 5 tablets (50 mg total) by mouth daily with breakfast for 2 days, THEN 4 tablets (40 mg total)  daily with breakfast for 2 days, THEN 3 tablets (30 mg total) daily with breakfast for 2 days, THEN 2 tablets (20 mg total) daily with breakfast for 2 days, THEN 1 tablet (10 mg total) daily with breakfast for 2 days.    saline (AYR) GEL Place 1 Application into both nostrils every 4 (four) hours as needed.    tiotropium (SPIRIVA) 18 MCG inhalation capsule Place 1 capsule (18 mcg total) into inhaler and inhale daily.    [DISCONTINUED] nystatin (MYCOSTATIN) 100000 UNIT/ML suspension Take 5 mLs (500,000 Units total) by mouth 4 (four) times daily.    [DISCONTINUED] QUEtiapine (SEROQUEL XR) 50 MG TB24 24 hr tablet Take 1 tablet (50 mg total) by mouth at bedtime.    amiodarone (PACERONE) 200 MG tablet Take 1 tablet (200 mg total) by mouth daily.    QUEtiapine (SEROQUEL XR) 50 MG TB24 24 hr tablet Take 2 tablets (100 mg total) by mouth at bedtime.    Facility-Administered Encounter Medications as of 03/15/2022  Medication   sodium chloride flush (NS) 0.9 % injection 3 mL   triamcinolone acetonide (KENALOG-40) injection 40 mg  Per Hospitalist: "Hospital Course:   72 year old F with PMH of COPD/chronic hypoxic RF on 3 L, recent hospitalization for COPD exacerbation and respiratory failure, ILD, paroxysmal A-fib on Eliquis, CAD, HTN, HLD, GERD, depression and prior tobacco use presenting with increased shortness of breath, dry cough and fever for 2 days.  Reportedly febrile to 101 at home.  Hypoxic to 70% on 3 L when EMS arrived.  Hypoxic to 89% on 6 L in ED.  Tachypneic to 20s and 30s.  WBC 12.8.  Lactic acid and Pro-Cal negative.  CXR with chronic changes in the lungs and emphysema with superimposed acute multilobar bronchopneumonia based on progressive change since her x-ray a day prior.   Patient admitted for severe sepsis with acute on chronic respiratory failure with hypoxia in the setting of bilateral pneumonia.  Started on vancomycin and cefepime and transition to cefepime and doxycycline on admission.   Also on IV steroids.   Further hospital course and management as outlined below.   Assessment and Plan:   Severe sepsis due to bilateral pneumonia: POA.  Complicated by underlying end stage COPD. Presented with tachypnea, leukocytosis with respiratory failure.  CXR concerning for multilobar pneumonia.  COVID-19, influenza, RSV, MRSA PCR screen and blood cultures negative so far.  Has history of Pseudomonas infection.  Sepsis physiology resolving. -Treated with IV cefepime and doxycycline given recent hospitalization --Discharge with 3 more days doxycycline and Omnicef -Incentive spirometry, flutter valve, OOB -Chest PT - vest ordered -patient denies improvement with this -Scheduled Mucinex   Acute on chronic respiratory failure with hypoxemia  due to pneumonia, End Stage COPD exacerbation and possible ILD: Desaturated to 89% on 6 L by [employer] while in ED.     03/08/2022: Patient has been stable on her baseline oxygen requirement few days.  She has intermittent dyspnea especially with exertion but appears at her baseline, given end-stage COPD.  No longer showing signs of increased respiratory effort accessory muscle use at rest as she has been prior days.  She is tolerating ambulation in the room with stable O2 sats and only minimal dyspnea.  -Antibiotics as above -Treated with high doses of IV Solu-Medrol -Start Prednisone 50 mg PO daily this morning, taper over next 10 days -Low dose morphine for dyspnea/air hunger - trial -Scheduled and as needed nebulizers. -Pulmonary toilet as above -Close pulmonology follow-up -Recommend consideration for palliative or home hospice services for symptom management (note, patient declined to try morphine for persistent dyspnea and air hunger this admission)   Acute on Chronic Normocytic anemia: Patient denies GI bleed, melena or hematochezia. She required 1 unit previous hospitalization with good response.  GI was consulted but family decided to hold off EGD and  colonoscopy at that time, planned for outpatient follow up.  She was discharged on PPI twice daily.   Some drop in Hgb could also be dilutional from IV fluid. 03/07/22: Hbg 6.9 -transfused 1 unit pRBC's good response 03/08/2022: Hemoglobin 8.9 -Outpatient GI follow up - pt acute illness precludes sedation for endoscopic evaluation at this time -P.o. Protonix twice daily -We held Plavix briefly when anemia worsened, okay to resume at discharge, no concern of active bleeding -Anemia panel shows iron deficiency, normal folate and B12 -Defer iron infusion with infection -Started oral iron supplement   Urinary retention: Bladder scan showed about 1 L overnight but only 500 UOP charted from overnight.  Foley was placed. Foley discontinued yesterday 03/07/2022.  Patient is voiding without difficulty, no further signs of urinary retention.   Chronic diastolic CHF: TTE in 16/1096 with LVEF of 55 to 60% and G2 DD.  Appears euvolemic on exam. -Continue home medications -Recommend daily weights at home -Monitor renal functions and electrolyte at follow-up   Essential hypertension: BP improved. -Continue home medications   Paroxysmal A-fib: In sinus rhythm.   Not on anticoagulation likely due to anemia. -Continue home amiodarone   History of CAD: No chest pain or anginal symptoms.  Serial troponin and EKG negative. -Continue home meds   Recent fall at home: Has some bruise over her back but no tenderness over spinous process.  No neurologic deficit. -PT/OT -Home health recommended -Fall precaution   Underlying interstitial lung disease, questionable MAI on chest x-ray:   -Outpatient follow-up with pulmonology   Constipation -Continue MiraLAX.   Mild cognitive impairment - family report early signs of dementia. 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 -Continue home Seroquel at bedtime -Delirium precautions"  Diagnostic Tests Reviewed: CLINICAL DATA:  72 year old female with  history of shortness of breath.   EXAM: PORTABLE CHEST 1 VIEW   COMPARISON:  Chest x-ray 03/03/2022.   FINDINGS: Lung volumes are increased with emphysematous changes. Diffuse interstitial prominence, peribronchial cuffing and patchy ill-defined opacities scattered throughout the lungs bilaterally, increased slightly compared to the prior study. Small left and trace right pleural effusions. No pneumothorax. Heart size is normal. Upper mediastinal contours are within normal limits. Atherosclerotic calcifications in the thoracic aorta.   IMPRESSION: 1. 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 2. Aortic atherosclerosis.  Disposition: Home  Consults: None  Discharge Instructions: Follow up with Pulmonology in 1-2 weeks Follow up with Primary Care in 1-2 weeks Consider referral for home hospice or palliative care Repeat CBC, BMP in 1-2 weeks  Disease/illness Education: Discussed today  Home Health/Community Services Discussions/Referrals: In place  Establishment or re-establishment of referral orders for community resources: In place  Discussion with other health care providers: None  Assessment and Support of treatment regimen adherence: [place] with: patient and daughter  Education for self-management, independent living, and ADLs: Discussed today  Takeysha notes that since she got out of the hospital she has not been doing very well. She has been very anxious again. She is feeling very down. She notes that her breathing has been better since getting out of the hospital. She does not want hospice coming out as she is  concerned about the cost. She has been very anxious about being left alone. She is feeling very jittery. She remains very tired and has not been wanting to eat. She is due to follow up with Dr. Raul Del again in a month- she saw him yesterday. She has no other concerns or complaints at this time.   Relevant past medical, surgical, family and social history reviewed and updated as indicated. Interim medical history since our last visit reviewed. Allergies and medications reviewed and updated.  Review of Systems  Constitutional:  Positive for activity change, appetite change and fatigue. Negative for chills, diaphoresis, fever and unexpected weight change.  HENT: Negative.    Respiratory:  Positive for cough and shortness of breath. Negative for apnea, choking, chest tightness, wheezing and stridor.   Cardiovascular: Negative.   Gastrointestinal: Negative.   Genitourinary: Negative.   Musculoskeletal: Negative.   Skin: Negative.   Neurological: Negative.   Psychiatric/Behavioral:  Positive for agitation and dysphoric mood. Negative for behavioral problems, confusion, decreased concentration, hallucinations, self-injury, sleep disturbance and suicidal ideas. The patient is nervous/anxious. The patient is not hyperactive.     Per HPI unless specifically indicated above     Objective:    BP (!) 144/40   Pulse 65   Temp 98.1 F (36.7 C) (Oral)   Ht '4\' 4"'$  (1.321 m)   Wt 99 lb 14.4 oz (45.3 kg)   LMP  (LMP Unknown)   SpO2 91%   BMI 25.98 kg/m   Wt Readings from Last 3 Encounters:  03/15/22 99 lb 14.4 oz (45.3 kg)  03/08/22 110 lb 7.2 oz (50.1 kg)  03/03/22 100 lb 11.2 oz (45.7 kg)    Physical Exam Vitals and nursing note reviewed.  Constitutional:      General: She is not in acute distress.    Appearance: Normal appearance. She is not ill-appearing, toxic-appearing or diaphoretic.  HENT:     Head: Normocephalic and atraumatic.     Right Ear: External ear normal.     Left Ear:  External ear normal.     Nose: Nose normal.     Mouth/Throat:     Mouth: Mucous membranes are moist.     Pharynx: Oropharynx is clear.  Eyes:     General: No scleral icterus.       Right eye: No discharge.        Left eye: No discharge.  Extraocular Movements: Extraocular movements intact.     Conjunctiva/sclera: Conjunctivae normal.     Pupils: Pupils are equal, round, and reactive to light.  Cardiovascular:     Rate and Rhythm: Normal rate and regular rhythm.     Pulses: Normal pulses.     Heart sounds: Normal heart sounds. No murmur heard.    No friction rub. No gallop.  Pulmonary:     Effort: Pulmonary effort is normal. No respiratory distress.     Breath sounds: No stridor. Wheezing present. No rhonchi or rales.  Chest:     Chest wall: No tenderness.  Musculoskeletal:        General: Normal range of motion.     Cervical back: Normal range of motion and neck supple.  Skin:    General: Skin is warm and dry.     Capillary Refill: Capillary refill takes less than 2 seconds.     Coloration: Skin is not jaundiced or pale.     Findings: No bruising, erythema, lesion or rash.  Neurological:     General: No focal deficit present.     Mental Status: She is alert and oriented to person, place, and time. Mental status is at baseline.  Psychiatric:        Mood and Affect: Mood is anxious. Affect is tearful.        Behavior: Behavior normal.        Thought Content: Thought content normal.        Judgment: Judgment normal.     Results for orders placed or performed in visit on 03/15/22  WET PREP FOR [place], YEAST, CLUE   Specimen: Urine   Urine  Result Value Ref Range   Trichomonas Exam Negative Negative   Yeast Exam Negative Negative   Clue Cell Exam Negative Negative  Urinalysis, Routine w reflex microscopic  Result Value Ref Range   Specific Gravity, UA 1.010 1.005 - 1.030   pH, UA 7.0 5.0 - 7.5   Color, UA Yellow Yellow   Appearance Ur Clear Clear   Leukocytes,UA  Negative Negative   Protein,UA 1+ (A) Negative/Trace   Glucose, UA Negative Negative   Ketones, UA Negative Negative   RBC, UA Negative Negative   Bilirubin, UA Negative Negative   Urobilinogen, Ur 0.2 0.2 - 1.0 mg/dL   Nitrite, UA Negative Negative   Microscopic Examination Comment   CBC with Differential/Platelet  Result Value Ref Range   WBC 12.2 (H) 3.4 - 10.8 x10E3/uL   RBC 3.43 (L) 3.77 - 5.28 x10E6/uL   Hemoglobin 10.3 (L) 11.1 - 15.9 g/dL   Hematocrit 29.3 (L) 34.0 - 46.6 %   MCV 85 79 - 97 fL   MCH 30.0 26.6 - 33.0 pg   MCHC 35.2 31.5 - 35.7 g/dL   RDW 14.6 11.7 - 15.4 %   Platelets 325 150 - 450 x10E3/uL   Neutrophils 88 Not Estab. %   Lymphs 8 Not Estab. %   Monocytes 2 Not Estab. %   Eos 0 Not Estab. %   Basos 0 Not Estab. %   Neutrophils Absolute 10.8 (H) 1.4 - 7.0 x10E3/uL   Lymphocytes Absolute 0.9 0.7 - 3.1 x10E3/uL   Monocytes Absolute 0.2 0.1 - 0.9 x10E3/uL   EOS (ABSOLUTE) 0.0 0.0 - 0.4 x10E3/uL   Basophils Absolute 0.0 0.0 - 0.2 x10E3/uL   Immature Granulocytes 2 Not Estab. %   Immature Grans (Abs) 0.2 (H) 0.0 - 0.1 [id number]/FK  Basic metabolic panel  Result Value  Ref Range   Glucose 123 (H) 70 - 99 mg/dL   BUN 17 8 - 27 mg/dL   Creatinine, Ser 0.76 0.57 - 1.00 mg/dL   eGFR 83 >59 mL/min/1.73   BUN/Creatinine Ratio 22 12 - 28   Sodium 133 (L) 134 - 144 mmol/L   Potassium 3.6 3.5 - 5.2 mmol/L   Chloride 90 (L) 96 - 106 mmol/L   CO2 27 20 - 29 mmol/L   Calcium 9.3 8.7 - 10.3 mg/dL      Assessment & Plan:   Problem List Items Addressed This Visit       Cardiovascular and Mediastinum   Essential hypertension    BP running a little high. Agitated today. Will continue current regimen and recheck 7-10 days.      Aortic atherosclerosis (HCC)    Will keep BP and cholesterol under good control.         Respiratory   COPD, severe ([place])    Stable today. To see pulmonology in the next month. Will continue current medication and recheck in 7-10 days.  Continue to monitor.       Bilateral pneumonia - Primary    Improved today. To see pulmonology in the next month. Will continue current medication and recheck in 7-10 days. Continue to monitor.       Acute on chronic respiratory failure with hypoxemia (HCC)    Improved today. To see pulmonology in the next month. Will continue current medication and recheck in 7-10 days. Continue to monitor.         Other   Depression, recurrent ([place])    Not doing well. Has been on steroids again. Will have her increase her seroquel to '100mg'$  qHS for the next week. Recheck 7-10 days.       Other Visit Diagnoses     SOB (shortness of breath)       Relevant Orders   CBC with Differential/Platelet (Completed)   Basic metabolic panel (Completed)   Severe sepsis ([place])       Resolved.   Dysuria       Will check UA. Await resutls.   Relevant Orders   Urinalysis, Routine w reflex microscopic (Completed)   WET PREP FOR [place], YEAST, CLUE (Completed)        Follow up plan: Return in about 1 week (around 03/22/2022).   >40 minutes spent with patient and daughter today

## 2022-03-16 DIAGNOSIS — G3184 Mild cognitive impairment, so stated: Secondary | ICD-10-CM | POA: Diagnosis not present

## 2022-03-16 DIAGNOSIS — R413 Other amnesia: Secondary | ICD-10-CM | POA: Diagnosis not present

## 2022-03-16 DIAGNOSIS — J9621 Acute and chronic respiratory failure with hypoxia: Secondary | ICD-10-CM | POA: Diagnosis not present

## 2022-03-16 DIAGNOSIS — J189 Pneumonia, unspecified organism: Secondary | ICD-10-CM | POA: Diagnosis not present

## 2022-03-16 DIAGNOSIS — J849 Interstitial pulmonary disease, unspecified: Secondary | ICD-10-CM | POA: Diagnosis not present

## 2022-03-16 DIAGNOSIS — J439 Emphysema, unspecified: Secondary | ICD-10-CM | POA: Diagnosis not present

## 2022-03-16 LAB — BASIC METABOLIC PANEL
BUN/Creatinine Ratio: 22 (ref 12–28)
BUN: 17 mg/dL (ref 8–27)
CO2: 27 mmol/L (ref 20–29)
Calcium: 9.3 mg/dL (ref 8.7–10.3)
Chloride: 90 mmol/L — ABNORMAL LOW (ref 96–106)
Creatinine, Ser: 0.76 mg/dL (ref 0.57–1.00)
Glucose: 123 mg/dL — ABNORMAL HIGH (ref 70–99)
Potassium: 3.6 mmol/L (ref 3.5–5.2)
Sodium: 133 mmol/L — ABNORMAL LOW (ref 134–144)
eGFR: 83 mL/min/{1.73_m2} (ref >59)

## 2022-03-16 LAB — CBC WITH DIFFERENTIAL/PLATELET
Basophils Absolute: 0 10*3/uL (ref 0.0–0.2)
Basos: 0 %
EOS (ABSOLUTE): 0 10*3/uL (ref 0.0–0.4)
Eos: 0 %
Hematocrit: 29.3 % — ABNORMAL LOW (ref 34.0–46.6)
Hemoglobin: 10.3 g/dL — ABNORMAL LOW (ref 11.1–15.9)
Immature Grans (Abs): 0.2 10*3/uL — ABNORMAL HIGH (ref 0.0–0.1)
Immature Granulocytes: 2 %
Lymphocytes Absolute: 0.9 10*3/uL (ref 0.7–3.1)
Lymphs: 8 %
MCH: 30 pg (ref 26.6–33.0)
MCHC: 35.2 g/dL (ref 31.5–35.7)
MCV: 85 fL (ref 79–97)
Monocytes Absolute: 0.2 10*3/uL (ref 0.1–0.9)
Monocytes: 2 %
Neutrophils Absolute: 10.8 10*3/uL — ABNORMAL HIGH (ref 1.4–7.0)
Neutrophils: 88 %
Platelets: 325 10*3/uL (ref 150–450)
RBC: 3.43 x10E6/uL — ABNORMAL LOW (ref 3.77–5.28)
RDW: 14.6 % (ref 11.7–15.4)
WBC: 12.2 10*3/uL — ABNORMAL HIGH (ref 3.4–10.8)

## 2022-03-16 NOTE — Patient Outreach (Signed)
  Care Coordination [facility] Note Transition Care Management Follow-up Telephone Call Date of discharge and from where: [phone] [facility] How have you been since you were released from the hospital? She is doing fair. The prednisone is messing with her head some Any questions or concerns? No  Items Reviewed: Did the pt receive and understand the discharge instructions provided? Yes  Medications obtained and verified? Yes  She is taking all her medications Other? No  Any new allergies since your discharge? No  Dietary orders reviewed? No Do you have support at home? Yes   Home Care and Equipment/Supplies: Were home health services ordered? Yes PT/OT If so, what is the name of the agency? [facility]  Has the agency set up a time to come to the patient's home? yes Were any new equipment or medical supplies ordered?  No What is the name of the medical supply agency? N/a Were you able to get the supplies/equipment? not applicable Do you have any questions related to the use of the equipment or supplies? No  Functional Questionnaire: (I = Independent and D = Dependent) ADLs: D  Bathing/Dressing- I  Meal Prep- D  Eating- I  Maintaining continence- I  Transferring/Ambulation- I  Managing Meds- D  Follow up appointments reviewed:  PCP Hospital f/u appt confirmed? Yes  Dr Wynetta Emery [phone] 2:25 [place] f/u appt confirmed? Yes   Dr Raul Del [phone] 3:30 Are transportation arrangements needed? No  If their condition worsens, is the pt aware to call PCP or go to the Emergency Dept.? Yes Was the patient provided with contact information for the PCP's office or ED? Yes Was to pt encouraged to call back with questions or concerns? Yes  SDOH assessments and interventions completed:   Yes SDOH Interventions Today    Flowsheet Row Most Recent Value  SDOH Interventions   Food Insecurity Interventions Intervention Not Indicated  Housing Interventions Intervention Not Indicated   Transportation Interventions Intervention Not Indicated       Care Coordination Interventions:  No Care Coordination interventions needed at this time. Patient is being followed by [facility] Coordinator  Encounter Outcome:  Pt. Visit Completed    [facility] [phone]

## 2022-03-17 ENCOUNTER — Telehealth: Payer: Self-pay | Admitting: Family Medicine

## 2022-03-17 DIAGNOSIS — B370 Candidal stomatitis: Secondary | ICD-10-CM

## 2022-03-17 NOTE — Telephone Encounter (Signed)
Copied from [place] [id number]. Topic: General - Deceased Patient >> April 10, 2022  2:21 PM Penni Bombard wrote:  Pt's daughter Alyse Low called asking for another rx for the Nystatin .  She said the thrush has not went away.  She has finished the prescription.  [address]  CB#  [phone]

## 2022-03-18 MED ORDER — NYSTATIN 100000 UNIT/ML MT SUSP: 5.0000 mL | Freq: Four times a day (QID) | OROMUCOSAL | 0 refills | Status: DC

## 2022-03-21 ENCOUNTER — Encounter: Payer: Self-pay | Admitting: Family Medicine

## 2022-03-21 DIAGNOSIS — R413 Other amnesia: Secondary | ICD-10-CM | POA: Diagnosis not present

## 2022-03-21 DIAGNOSIS — J849 Interstitial pulmonary disease, unspecified: Secondary | ICD-10-CM | POA: Diagnosis not present

## 2022-03-21 DIAGNOSIS — J439 Emphysema, unspecified: Secondary | ICD-10-CM | POA: Diagnosis not present

## 2022-03-21 DIAGNOSIS — G3184 Mild cognitive impairment, so stated: Secondary | ICD-10-CM | POA: Diagnosis not present

## 2022-03-21 DIAGNOSIS — J9621 Acute and chronic respiratory failure with hypoxia: Secondary | ICD-10-CM | POA: Diagnosis not present

## 2022-03-21 DIAGNOSIS — J189 Pneumonia, unspecified organism: Secondary | ICD-10-CM | POA: Diagnosis not present

## 2022-03-21 NOTE — Assessment & Plan Note (Signed)
Improved today. To see pulmonology in the next month. Will continue current medication and recheck in 7-10 days. Continue to monitor.

## 2022-03-21 NOTE — Assessment & Plan Note (Signed)
Not doing well. Has been on steroids again. Will have her increase her seroquel to '100mg'$  qHS for the next week. Recheck 7-10 days.

## 2022-03-21 NOTE — Assessment & Plan Note (Signed)
Will keep BP and cholesterol under good control.

## 2022-03-21 NOTE — Assessment & Plan Note (Signed)
Stable today. To see pulmonology in the next month. Will continue current medication and recheck in 7-10 days. Continue to monitor.

## 2022-03-21 NOTE — Assessment & Plan Note (Signed)
BP running a little high. Agitated today. Will continue current regimen and recheck 7-10 days.

## 2022-03-22 DIAGNOSIS — J9621 Acute and chronic respiratory failure with hypoxia: Secondary | ICD-10-CM | POA: Diagnosis not present

## 2022-03-22 DIAGNOSIS — G3184 Mild cognitive impairment, so stated: Secondary | ICD-10-CM | POA: Diagnosis not present

## 2022-03-22 DIAGNOSIS — J439 Emphysema, unspecified: Secondary | ICD-10-CM | POA: Diagnosis not present

## 2022-03-22 DIAGNOSIS — R413 Other amnesia: Secondary | ICD-10-CM | POA: Diagnosis not present

## 2022-03-22 DIAGNOSIS — J189 Pneumonia, unspecified organism: Secondary | ICD-10-CM | POA: Diagnosis not present

## 2022-03-22 DIAGNOSIS — J849 Interstitial pulmonary disease, unspecified: Secondary | ICD-10-CM | POA: Diagnosis not present

## 2022-03-23 ENCOUNTER — Other Ambulatory Visit: Payer: Self-pay | Admitting: Family Medicine

## 2022-03-23 ENCOUNTER — Encounter: Payer: Self-pay | Admitting: Family Medicine

## 2022-03-23 ENCOUNTER — Telehealth (INDEPENDENT_AMBULATORY_CARE_PROVIDER_SITE_OTHER): Payer: Medicare Other | Admitting: Family Medicine

## 2022-03-23 VITALS — BP 193/61 | HR 65 | Wt 91.3 lb

## 2022-03-23 DIAGNOSIS — F339 Major depressive disorder, recurrent, unspecified: Secondary | ICD-10-CM | POA: Diagnosis not present

## 2022-03-23 DIAGNOSIS — J449 Chronic obstructive pulmonary disease, unspecified: Secondary | ICD-10-CM | POA: Diagnosis not present

## 2022-03-23 MED ORDER — ALBUTEROL SULFATE (2.5 MG/3ML) 0.083% IN NEBU: 2.5000 mg | INHALATION_SOLUTION | RESPIRATORY_TRACT | 1 refills | Status: DC | PRN

## 2022-03-23 MED ORDER — METOPROLOL SUCCINATE ER 50 MG PO TB24: 50.0000 mg | ORAL_TABLET | Freq: Every day | ORAL | 0 refills | Status: DC

## 2022-03-23 MED ORDER — LOSARTAN POTASSIUM 25 MG PO TABS: 25.0000 mg | ORAL_TABLET | Freq: Every day | ORAL | 1 refills | Status: DC

## 2022-03-23 MED ORDER — ATORVASTATIN CALCIUM 40 MG PO TABS: 40.0000 mg | ORAL_TABLET | Freq: Every day | ORAL | 1 refills | Status: DC

## 2022-03-23 MED ORDER — CLOPIDOGREL BISULFATE 75 MG PO TABS: 75.0000 mg | ORAL_TABLET | Freq: Every day | ORAL | 0 refills | Status: DC

## 2022-03-23 NOTE — Telephone Encounter (Signed)
All can be filled except mirtazapine- Rx 12/21/21 #90 1RF.  Requested Prescriptions  Pending Prescriptions Disp Refills   atorvastatin (LIPITOR) 40 MG tablet 90 tablet 1    Sig: Take 1 tablet (40 mg total) by mouth daily.     Cardiovascular:  Antilipid - Statins Failed - 03/23/2022  3:27 PM      Failed - Lipid Panel in normal range within the last 12 months    Cholesterol, Total  Date Value Ref Range Status  08/16/2021 107 100 - 199 mg/dL Final   Cholesterol  Date Value Ref Range Status  01/15/2022 100 0 - 200 mg/dL Final   Cholesterol Piccolo, Waived  Date Value Ref Range Status  03/06/2015 148 <200 mg/dL Final    Comment:                            Desirable                <200                         Borderline High      200- 239                         High                     >239    LDL Chol Calc (NIH)  Date Value Ref Range Status  08/16/2021 42 0 - 99 mg/dL Final   LDL Cholesterol  Date Value Ref Range Status  01/15/2022 41 0 - 99 mg/dL Final    Comment:           Total Cholesterol/HDL:CHD Risk Coronary Heart Disease Risk Table                     Men   Women  1/2 Average Risk   3.4   3.3  Average Risk       5.0   4.4  2 X Average Risk   9.6   7.1  3 X Average Risk  23.4   11.0        Use the calculated Patient Ratio above and the CHD Risk Table to determine the patient's CHD Risk.        ATP III CLASSIFICATION (LDL):  <100     mg/dL   Optimal  100-129  mg/dL   Near or Above                    Optimal  130-159  mg/dL   Borderline  160-189  mg/dL   High  >190     mg/dL   Very High Performed at [facility], [place], [place] [address]    HDL  Date Value Ref Range Status  01/15/2022 48 >40 mg/dL Final  08/16/2021 49 >39 mg/dL Final   Triglycerides  Date Value Ref Range Status  01/15/2022 54 <150 mg/dL Final   Triglycerides Piccolo,Waived  Date Value Ref Range Status  03/06/2015 100 <150 mg/dL Final    Comment:                             Normal                   <150  Borderline High     150 - 199                         High                200 - 499                         Very High                >499          Passed - Patient is not pregnant      Passed - Valid encounter within last 12 months    Recent Outpatient Visits           Today    [place], Megan P, DO   1 week ago Pneumonia of both lower lobes due to infectious organism   [facility], Megan P, DO   1 week ago Leg swelling   [place], Erin E, PA-C   2 weeks ago Essential hypertension   [place], [place]urz, DO   1 month ago Depression with anxiety   [facility] [place], [place], DO       Future Appointments             In 1 week [place], Barb Merino, DO [place], [facility]             clopidogrel (PLAVIX) 75 MG tablet 90 tablet 0    Sig: Take 1 tablet (75 mg total) by mouth daily.     Hematology: Antiplatelets - clopidogrel Failed - 03/23/2022  3:27 PM      Failed - HCT in normal range and within 180 days    Hematocrit  Date Value Ref Range Status  03/15/2022 29.3 (L) 34.0 - 46.6 % Final         Failed - HGB in normal range and within 180 days    Hemoglobin  Date Value Ref Range Status  03/15/2022 10.3 (L) 11.1 - 15.9 g/dL Final         Passed - PLT in normal range and within 180 days    Platelets  Date Value Ref Range Status  03/15/2022 325 150 - 450 x10E3/uL Final         Passed - Cr in normal range and within 360 days    Creatinine  Date Value Ref Range Status  02/11/2013 0.95 0.60 - 1.30 mg/dL Final   Creatinine, Ser  Date Value Ref Range Status  03/15/2022 0.76 0.57 - 1.00 mg/dL Final         Passed - Valid encounter within last 6 months    Recent Outpatient Visits           Today    [place], Megan P, DO   1 week ago Pneumonia of both lower  lobes due to infectious organism   [place], Megan P, DO   1 week ago Leg swelling   [facility] [place], Dani Gobble, PA-C   2 weeks ago Essential hypertension   [place], [place], DO   1 month ago Depression with anxiety   [facility] [place], [place], DO       Future Appointments             In 1 week [place],  Megan P, DO [facility], [facility]             losartan (COZAAR) 25 MG tablet 90 tablet 1    Sig: Take 1 tablet (25 mg total) by mouth daily.     Cardiovascular:  Angiotensin Receptor Blockers Failed - 03/23/2022  3:27 PM      Failed - Last BP in normal range    BP Readings from Last 1 Encounters:  03/23/22 (!) 193/61         Passed - Cr in normal range and within 180 days    Creatinine  Date Value Ref Range Status  02/11/2013 0.95 0.60 - 1.30 mg/dL Final   Creatinine, Ser  Date Value Ref Range Status  03/15/2022 0.76 0.57 - 1.00 mg/dL Final         Passed - K in normal range and within 180 days    Potassium  Date Value Ref Range Status  03/15/2022 3.6 3.5 - 5.2 mmol/L Final  02/11/2013 3.4 (L) 3.5 - 5.1 mmol/L Final         Passed - Patient is not pregnant      Passed - Valid encounter within last 6 months    Recent Outpatient Visits           Today    [place], Megan P, DO   1 week ago Pneumonia of both lower lobes due to infectious organism   [place], Megan P, DO   1 week ago Leg swelling   [place], Dani Gobble, PA-C   2 weeks ago Essential hypertension   [place], [place], DO   1 month ago Depression with anxiety   [facility] [place], [place], DO       Future Appointments             In 1 week [place], Megan P, DO [place], [facility]             metoprolol succinate (TOPROL-XL) 50 MG 24 hr tablet 90 tablet 0    Sig: Take 1 tablet (50 mg  total) by mouth daily. Take with or immediately following a meal.     Cardiovascular:  Beta Blockers Failed - 03/23/2022  3:27 PM      Failed - Last BP in normal range    BP Readings from Last 1 Encounters:  03/23/22 (!) 193/61         Passed - Last Heart Rate in normal range    Pulse Readings from Last 1 Encounters:  03/23/22 65         Passed - Valid encounter within last 6 months    Recent Outpatient Visits           Today    [place], [place] P, DO   1 week ago Pneumonia of both lower lobes due to infectious organism   [place], Megan P, DO   1 week ago Leg swelling   [place], Dani Gobble, PA-C   2 weeks ago Essential hypertension   [place], [place], DO   1 month ago Depression with anxiety   [place], [place], DO       Future Appointments             In 1 week Wynetta Emery, Barb Merino, DO [employer], [facility]  mirtazapine (REMERON) 45 MG tablet 90 tablet 1    Sig: Take 1 tablet (45 mg total) by mouth at bedtime.     Psychiatry: Antidepressants - mirtazapine Passed - 03/23/2022  3:27 PM      Passed - Completed PHQ-2 or PHQ-9 in the last 360 days      Passed - Valid encounter within last 6 months    Recent Outpatient Visits           Today    [place], [place] P, DO   1 week ago Pneumonia of both lower lobes due to infectious organism   [place], Megan P, DO   1 week ago Leg swelling   [place], Dani Gobble, PA-C   2 weeks ago Essential hypertension   [place], [place], DO   1 month ago Depression with anxiety   [place], Barb Merino, DO       Future Appointments             In 1 week Wynetta Emery, Barb Merino, DO [employer], [facility]

## 2022-03-23 NOTE — Progress Notes (Signed)
BP (!) 193/61   Pulse 65   Wt 91 lb 4.8 oz (41.4 kg)   LMP  (LMP Unknown)   SpO2 94%   BMI 23.74 kg/m    Subjective:    Patient ID: Kristi Clark, female    DOB: 1951-01-28, 72 y.o.   MRN: [id number]  HPI: Kristi Clark is a 72 y.o. female  Chief Complaint  Patient presents with   COPD   Depression   COPD- Breathing has been fair. Has been working on pursed lip breathing. Therapist came yesterday.  COPD status: stable Satisfied with current treatment?: yes Oxygen use: yes Dyspnea frequency: often Cough frequency: daily Rescue inhaler frequency: often   Limitation of activity: yes Pneumovax: Up to Date Influenza: Up to Date  ANXIETY/STRESS- Still has been feeling very anxious- usually with getting her dog put up before the home health comes.  Duration: chronic Status:exacerbated Anxious mood: yes  Excessive worrying: yes Irritability: yes  Sweating: no Nausea: no Palpitations:yes Hyperventilation: no Panic attacks: yes Agoraphobia: yes  Obscessions/compulsions: no Depressed mood: yes    03/03/2022    2:12 PM 02/21/2022    1:43 PM 02/10/2022    3:20 PM 02/02/2022    1:34 PM 01/12/2022   10:46 AM  Depression screen PHQ 2/9  Decreased Interest '2 2 3 2 3  '$ Down, Depressed, Hopeless '1 2 3 2 3  '$ PHQ - 2 Score '3 4 6 4 6  '$ Altered sleeping '1 3 3 2 1  '$ Tired, decreased energy '3 3 3 2 3  '$ Change in appetite 0 '2 3 2 1  '$ Feeling bad or failure about yourself  '2 2 3 1 3  '$ Trouble concentrating '2 2 2 1 3  '$ Moving slowly or fidgety/restless 1 0 3 0 3  Suicidal thoughts 0 0 1 0 0  PHQ-9 Score '12 16 24 12 20  '$ Difficult doing work/chores Somewhat difficult Somewhat difficult Extremely dIfficult Very difficult    Anhedonia: no Weight changes: no Insomnia: yes hard to fall asleep  Hypersomnia: no Fatigue/loss of energy: yes Feelings of worthlessness: yes Feelings of guilt: yes Impaired concentration/indecisiveness: yes Suicidal ideations: no  Crying spells:  yes Recent Stressors/Life Changes: yes   Relationship problems: no   Family stress: no     Financial stress: no    Job stress: no    Recent death/loss: no   Relevant past medical, surgical, family and social history reviewed and updated as indicated. Interim medical history since our last visit reviewed. Allergies and medications reviewed and updated.  Review of Systems  Constitutional: Negative.   HENT: Negative.    Respiratory: Negative.    Cardiovascular: Negative.   Musculoskeletal:  Positive for arthralgias. Negative for back pain, gait problem, joint swelling, myalgias, neck pain and neck stiffness.  Skin: Negative.   Psychiatric/Behavioral: Negative.      Per HPI unless specifically indicated above     Objective:    BP (!) 193/61   Pulse 65   Wt 91 lb 4.8 oz (41.4 kg)   LMP  (LMP Unknown)   SpO2 94%   BMI 23.74 kg/m   Wt Readings from Last 3 Encounters:  03/23/22 91 lb 4.8 oz (41.4 kg)  03/15/22 99 lb 14.4 oz (45.3 kg)  03/08/22 110 lb 7.2 oz (50.1 kg)    Physical Exam Vitals and nursing note reviewed.  Constitutional:      General: She is not in acute distress.    Appearance: Normal appearance. She is normal weight.  She is ill-appearing. She is not toxic-appearing or diaphoretic.  HENT:     Head: Normocephalic and atraumatic.     Right Ear: External ear normal.     Left Ear: External ear normal.     Nose: Nose normal.     Mouth/Throat:     Mouth: Mucous membranes are moist.     Pharynx: Oropharynx is clear.  Eyes:     General: No scleral icterus.       Right eye: No discharge.        Left eye: No discharge.     Conjunctiva/sclera: Conjunctivae normal.     Pupils: Pupils are equal, round, and reactive to light.  Pulmonary:     Effort: Pulmonary effort is normal. No respiratory distress.     Comments: Speaking in full sentences Musculoskeletal:        General: Normal range of motion.     Cervical back: Normal range of motion.  Skin:     Coloration: Skin is not jaundiced or pale.     Findings: No bruising, erythema, lesion or rash.  Neurological:     Mental Status: She is alert and oriented to person, place, and time. Mental status is at baseline.  Psychiatric:        Mood and Affect: Mood normal.        Behavior: Behavior normal.        Thought Content: Thought content normal.        Judgment: Judgment normal.     Results for orders placed or performed in visit on 03/15/22  WET PREP FOR [place], YEAST, CLUE   Specimen: Urine   Urine  Result Value Ref Range   Trichomonas Exam Negative Negative   Yeast Exam Negative Negative   Clue Cell Exam Negative Negative  Urinalysis, Routine w reflex microscopic  Result Value Ref Range   Specific Gravity, UA 1.010 1.005 - 1.030   pH, UA 7.0 5.0 - 7.5   Color, UA Yellow Yellow   Appearance Ur Clear Clear   Leukocytes,UA Negative Negative   Protein,UA 1+ (A) Negative/Trace   Glucose, UA Negative Negative   Ketones, UA Negative Negative   RBC, UA Negative Negative   Bilirubin, UA Negative Negative   Urobilinogen, Ur 0.2 0.2 - 1.0 mg/dL   Nitrite, UA Negative Negative   Microscopic Examination Comment   CBC with Differential/Platelet  Result Value Ref Range   WBC 12.2 (H) 3.4 - 10.8 x10E3/uL   RBC 3.43 (L) 3.77 - 5.28 x10E6/uL   Hemoglobin 10.3 (L) 11.1 - 15.9 g/dL   Hematocrit 29.3 (L) 34.0 - 46.6 %   MCV 85 79 - 97 fL   MCH 30.0 26.6 - 33.0 pg   MCHC 35.2 31.5 - 35.7 g/dL   RDW 14.6 11.7 - 15.4 %   Platelets 325 150 - 450 x10E3/uL   Neutrophils 88 Not Estab. %   Lymphs 8 Not Estab. %   Monocytes 2 Not Estab. %   Eos 0 Not Estab. %   Basos 0 Not Estab. %   Neutrophils Absolute 10.8 (H) 1.4 - 7.0 x10E3/uL   Lymphocytes Absolute 0.9 0.7 - 3.1 x10E3/uL   Monocytes Absolute 0.2 0.1 - 0.9 x10E3/uL   EOS (ABSOLUTE) 0.0 0.0 - 0.4 x10E3/uL   Basophils Absolute 0.0 0.0 - 0.2 x10E3/uL   Immature Granulocytes 2 Not Estab. 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 Off prednisone. Due to follow up with Dr. Raul Del in about 2 weeks. Continue current regimen. Continue to monitor. Call with any concerns.       Relevant Medications   albuterol (PROVENTIL) (2.5 MG/3ML) 0.083% nebulizer solution     Other   Depression, recurrent ([place])    Still very anxious, but less so than last visit. Back to '50mg'$  on her seroquel- will increase back to '100mg'$  for the next week to help until the prednisone gets out of her system. Continue to monitor closely. Call with any concerns.         Follow up plan: Return in about 1 week (around 03/30/2022) for in person.    This visit was completed via video visit through MyChart due to the restrictions of the COVID-19 pandemic. All issues as above were discussed and addressed. Physical exam was done as above through visual confirmation on video through MyChart. If it was felt that the patient should be evaluated in the office, they were directed there. The patient verbally consented to this visit. Location of the patient: home Location of the provider: work Those involved with this call:  Provider: Park Liter, DO CMA: Irena Reichmann, [place] Desk/Registration: [employer]  Time spent on call:  25 minutes with patient face to face via video conference. More than 50% of this time was spent in counseling and coordination of care. 40 minutes total spent in review of patient's record and preparation of  their chart.

## 2022-03-23 NOTE — Telephone Encounter (Signed)
Copied from [place] [id number]. Topic: General - Other >> Mar 23, 2022  2:56 PM Everette C wrote: Reason for CRM: Medication Refill - Medication: mirtazapine (REMERON) 45 MG tablet [[id number]]  atorvastatin (LIPITOR) 40 MG tablet [[id number]]  losartan (COZAAR) 25 MG tablet [[id number]]  metoprolol succinate (TOPROL-XL) 50 MG 24 hr tablet [[id number]]  clopidogrel (PLAVIX) 75 MG tablet [[id number]]  Has the patient contacted their pharmacy? Yes.   (Agent: If no, request that the patient contact the pharmacy for the refill. If patient does not wish to contact the pharmacy document the reason why and proceed with request.) (Agent: If yes, when and what did the pharmacy advise?)  Preferred Pharmacy (with phone number or street name): [facility] - [place], [place] [address] [facility] [address] [place] [address] Phone: [phone] Fax: [phone] Hours: Not open 24 hours   Has the patient been seen for an appointment in the last year OR does the patient have an upcoming appointment? Yes.    Agent: Please be advised that RX refills may take up to 3 business days. We ask that you follow-up with your pharmacy.

## 2022-03-24 ENCOUNTER — Encounter: Payer: Self-pay | Admitting: Family Medicine

## 2022-03-24 NOTE — Assessment & Plan Note (Signed)
Breathing more comfortably today. Off prednisone. Due to follow up with Dr. Raul Del in about 2 weeks. Continue current regimen. Continue to monitor. Call with any concerns.

## 2022-03-24 NOTE — Assessment & Plan Note (Signed)
Still very anxious, but less so than last visit. Back to '50mg'$  on her seroquel- will increase back to '100mg'$  for the next week to help until the prednisone gets out of her system. Continue to monitor closely. Call with any concerns.

## 2022-03-25 DIAGNOSIS — J849 Interstitial pulmonary disease, unspecified: Secondary | ICD-10-CM | POA: Diagnosis not present

## 2022-03-25 DIAGNOSIS — J189 Pneumonia, unspecified organism: Secondary | ICD-10-CM | POA: Diagnosis not present

## 2022-03-25 DIAGNOSIS — J439 Emphysema, unspecified: Secondary | ICD-10-CM | POA: Diagnosis not present

## 2022-03-25 DIAGNOSIS — G3184 Mild cognitive impairment, so stated: Secondary | ICD-10-CM | POA: Diagnosis not present

## 2022-03-25 DIAGNOSIS — J9621 Acute and chronic respiratory failure with hypoxia: Secondary | ICD-10-CM | POA: Diagnosis not present

## 2022-03-25 DIAGNOSIS — R413 Other amnesia: Secondary | ICD-10-CM | POA: Diagnosis not present

## 2022-03-27 DIAGNOSIS — M47812 Spondylosis without myelopathy or radiculopathy, cervical region: Secondary | ICD-10-CM | POA: Diagnosis not present

## 2022-03-27 DIAGNOSIS — S7002XA Contusion of left hip, initial encounter: Secondary | ICD-10-CM | POA: Diagnosis not present

## 2022-03-27 DIAGNOSIS — M4312 Spondylolisthesis, cervical region: Secondary | ICD-10-CM | POA: Diagnosis not present

## 2022-03-27 DIAGNOSIS — S79912A Unspecified injury of left hip, initial encounter: Secondary | ICD-10-CM | POA: Diagnosis not present

## 2022-03-27 DIAGNOSIS — I509 Heart failure, unspecified: Secondary | ICD-10-CM | POA: Diagnosis not present

## 2022-03-27 DIAGNOSIS — S199XXA Unspecified injury of neck, initial encounter: Secondary | ICD-10-CM | POA: Diagnosis not present

## 2022-03-27 DIAGNOSIS — S4992XA Unspecified injury of left shoulder and upper arm, initial encounter: Secondary | ICD-10-CM | POA: Diagnosis not present

## 2022-03-27 DIAGNOSIS — Z882 Allergy status to sulfonamides status: Secondary | ICD-10-CM | POA: Diagnosis not present

## 2022-03-27 DIAGNOSIS — S0990XA Unspecified injury of head, initial encounter: Secondary | ICD-10-CM | POA: Diagnosis not present

## 2022-03-27 DIAGNOSIS — S40012A Contusion of left shoulder, initial encounter: Secondary | ICD-10-CM | POA: Diagnosis not present

## 2022-03-27 DIAGNOSIS — Z7982 Long term (current) use of aspirin: Secondary | ICD-10-CM | POA: Diagnosis not present

## 2022-03-27 DIAGNOSIS — Z7902 Long term (current) use of antithrombotics/antiplatelets: Secondary | ICD-10-CM | POA: Diagnosis not present

## 2022-03-27 DIAGNOSIS — Z7951 Long term (current) use of inhaled steroids: Secondary | ICD-10-CM | POA: Diagnosis not present

## 2022-03-27 DIAGNOSIS — I110 Hypertensive heart disease with heart failure: Secondary | ICD-10-CM | POA: Diagnosis not present

## 2022-03-27 DIAGNOSIS — Z885 Allergy status to narcotic agent status: Secondary | ICD-10-CM | POA: Diagnosis not present

## 2022-03-27 DIAGNOSIS — J449 Chronic obstructive pulmonary disease, unspecified: Secondary | ICD-10-CM | POA: Diagnosis not present

## 2022-03-27 DIAGNOSIS — F17210 Nicotine dependence, cigarettes, uncomplicated: Secondary | ICD-10-CM | POA: Diagnosis not present

## 2022-03-27 DIAGNOSIS — I2510 Atherosclerotic heart disease of native coronary artery without angina pectoris: Secondary | ICD-10-CM | POA: Diagnosis not present

## 2022-03-27 DIAGNOSIS — Z79899 Other long term (current) drug therapy: Secondary | ICD-10-CM | POA: Diagnosis not present

## 2022-03-27 DIAGNOSIS — Z9981 Dependence on supplemental oxygen: Secondary | ICD-10-CM | POA: Diagnosis not present

## 2022-03-27 DIAGNOSIS — Z791 Long term (current) use of non-steroidal anti-inflammatories (NSAID): Secondary | ICD-10-CM | POA: Diagnosis not present

## 2022-03-27 DIAGNOSIS — S0003XA Contusion of scalp, initial encounter: Secondary | ICD-10-CM | POA: Diagnosis not present

## 2022-03-27 DIAGNOSIS — I4891 Unspecified atrial fibrillation: Secondary | ICD-10-CM | POA: Diagnosis not present

## 2022-03-27 DIAGNOSIS — Z880 Allergy status to penicillin: Secondary | ICD-10-CM | POA: Diagnosis not present

## 2022-03-28 ENCOUNTER — Ambulatory Visit: Payer: Self-pay | Admitting: *Deleted

## 2022-03-28 NOTE — Telephone Encounter (Signed)
Reason for Disposition  [1] Fall AND [2] went to emergency department for evaluation or treatment    She has an appt. On Friday.  Answer Assessment - Initial Assessment Questions 1. MECHANISM: "How did the fall happen?"     [name], daughter calling in.   Her mother fell yesterday afternoon.   She bent down to pick up something and lost her balance.    My brother to her to the ED at [facility].   They did x rays and CT scans.  They said the x rays were fine.   She has an appt. For Friday for a follow up.    I didn't know if Dr. Wynetta Emery needed the records from [facility] before my mother came in.   I let [name] know I would let Dr. Wynetta Emery know that she went to [facility] ED so she can access her records of what was done. [name] thanked me for my help.   Her mother was sleeping now.  2. DOMESTIC VIOLENCE AND ELDER ABUSE SCREENING: "Did you fall because someone pushed you or tried to hurt you?" If Yes, ask: "Are you safe now?"     N/A 3. ONSET: "When did the fall happen?" (e.g., minutes, hours, or days ago)     Yesterday afternoon. 4. LOCATION: "What part of the body hit the ground?" (e.g., back, buttocks, head, hips, knees, hands, head, stomach)     Was seen in ED at [facility] and x rayed and evaluated.   No breaks. 5. INJURY: "Did you hurt (injure) yourself when you fell?" If Yes, ask: "What did you injure? Tell me more about this?" (e.g., body area; type of injury; pain severity)"     They said the x rays and scan looked good. 6. PAIN: "Is there any pain?" If Yes, ask: "How bad is the pain?" (e.g., Scale 1-10; or mild,  moderate, severe)   - NONE (0): No pain   - MILD (1-3): Doesn't interfere with normal activities    - MODERATE (4-7): Interferes with normal activities or awakens from sleep    - SEVERE (8-10): Excruciating pain, unable to do any normal activities      "She just wants to keep getting up and moving around too much".   7. SIZE: For cuts, bruises, or swelling,  ask: "How large is it?" (e.g., inches or centimeters)      Not asked 8. PREGNANCY: "Is there any chance you are pregnant?" "When was your last menstrual period?"     N/A 9. OTHER SYMPTOMS: "Do you have any other symptoms?" (e.g., dizziness, fever, weakness; new onset or worsening).      Not asked since mother was sleeping and was seen in ED yesterday after the fall. 10. CAUSE: "What do you think caused the fall (or falling)?" (e.g., tripped, dizzy spell)       She bent over to pick up something and lost her balance.  Protocols used: Falls and [facility]

## 2022-03-28 NOTE — Telephone Encounter (Signed)
  Chief Complaint: Daughter wanted to let Dr. Wynetta Emery know pt went to the ED at [facility] and that's where her records were in case Dr. Wynetta Emery wanted to look at them prior to her appt. On Friday. Symptoms:  Frequency:  Pertinent Negatives: Patient denies  Disposition: '[]'$ ED /'[]'$ Urgent Care (no appt availability in office) / '[]'$ Appointment(In office/virtual)/ '[]'$  [place] [facility] [place] [facility]/ '[]'$ Home Care/ '[]'$ Refused Recommended Disposition /'[]'$ [place] [facility]/ '[x]'$  Follow-up with PCP Additional Notes: Appt on Friday was already made

## 2022-03-29 DIAGNOSIS — G3184 Mild cognitive impairment, so stated: Secondary | ICD-10-CM | POA: Diagnosis not present

## 2022-03-29 DIAGNOSIS — J9621 Acute and chronic respiratory failure with hypoxia: Secondary | ICD-10-CM | POA: Diagnosis not present

## 2022-03-29 DIAGNOSIS — J439 Emphysema, unspecified: Secondary | ICD-10-CM | POA: Diagnosis not present

## 2022-03-29 DIAGNOSIS — J189 Pneumonia, unspecified organism: Secondary | ICD-10-CM | POA: Diagnosis not present

## 2022-03-29 DIAGNOSIS — J849 Interstitial pulmonary disease, unspecified: Secondary | ICD-10-CM | POA: Diagnosis not present

## 2022-03-29 DIAGNOSIS — R413 Other amnesia: Secondary | ICD-10-CM | POA: Diagnosis not present

## 2022-03-31 ENCOUNTER — Other Ambulatory Visit: Payer: Self-pay | Admitting: Family Medicine

## 2022-03-31 MED ORDER — ISOSORBIDE MONONITRATE ER 60 MG PO TB24: 60.0000 mg | ORAL_TABLET | Freq: Two times a day (BID) | ORAL | 1 refills | Status: DC

## 2022-03-31 MED ORDER — PANTOPRAZOLE SODIUM 40 MG PO TBEC: 40.0000 mg | DELAYED_RELEASE_TABLET | Freq: Two times a day (BID) | ORAL | 9 refills | Status: DC

## 2022-03-31 NOTE — Telephone Encounter (Signed)
Requested Prescriptions  Pending Prescriptions Disp Refills   isosorbide mononitrate (IMDUR) 60 MG 24 hr tablet 90 tablet 1    Sig: Take 1 tablet (60 mg total) by mouth 2 (two) times daily.     Cardiovascular:  Nitrates Failed - 03/31/2022  9:59 AM      Failed - Last BP in normal range    BP Readings from Last 1 Encounters:  03/23/22 (!) 193/61         Passed - Last Heart Rate in normal range    Pulse Readings from Last 1 Encounters:  03/23/22 65         Passed - Valid encounter within last 12 months    Recent Outpatient Visits           1 week ago COPD, severe ([place])   [place], Megan P, DO   2 weeks ago Pneumonia of both lower lobes due to infectious organism   [place], Megan P, DO   3 weeks ago Leg swelling   [place], Dani Gobble, PA-C   4 weeks ago Essential hypertension   [place], [place], DO   1 month ago Depression with anxiety   [place] [facility] [place], [place], DO       Future Appointments             Tomorrow [name], DO [place], [facility]             pantoprazole (PROTONIX) 40 MG tablet 60 tablet 9    Sig: Take 1 tablet (40 mg total) by mouth 2 (two) times daily.     Gastroenterology: Proton Pump Inhibitors Passed - 03/31/2022  9:59 AM      Passed - Valid encounter within last 12 months    Recent Outpatient Visits           1 week ago COPD, severe ([place])   [place] P, DO   2 weeks ago Pneumonia of both lower lobes due to infectious organism   [place] P, DO   3 weeks ago Leg swelling   [place], PA-C   4 weeks ago Essential hypertension   [place], [place], DO   1 month ago Depression with anxiety    [place], Barb Merino, DO       Future Appointments             Tomorrow [name], DO [place], [facility]

## 2022-03-31 NOTE — Telephone Encounter (Signed)
Medication Refill - Medication: isosorbide mononitrate (IMDUR) 60 MG 24 hr tablet   pantoprazole (PROTONIX) 40 MG tablet / this was sent to [place] for last refill but pt needs this to go to [facility] /pt wants remaining refills transferred to [place]   Has the patient contacted their pharmacy? Yes.   (Agent: If no, request that the patient contact the pharmacy for the refill. If patient does not wish to contact the pharmacy document the reason why and proceed with request.) (Agent: If yes, when and what did the pharmacy advise?)  Preferred Pharmacy (with phone number or street name):  [facility] [place], [place] - [address] [facility]     Has the patient been seen for an appointment in the last year OR does the patient have an upcoming appointment? Yes.    Agent: Please be advised that RX refills may take up to 3 business days. We ask that you follow-up with your pharmacy.

## 2022-04-01 ENCOUNTER — Ambulatory Visit: Payer: Self-pay | Admitting: *Deleted

## 2022-04-01 ENCOUNTER — Ambulatory Visit (INDEPENDENT_AMBULATORY_CARE_PROVIDER_SITE_OTHER): Payer: Medicare Other | Admitting: Family Medicine

## 2022-04-01 ENCOUNTER — Encounter: Payer: Self-pay | Admitting: Family Medicine

## 2022-04-01 VITALS — BP 150/38 | HR 64 | Temp 98.3°F | Wt 92.9 lb

## 2022-04-01 DIAGNOSIS — I10 Essential (primary) hypertension: Secondary | ICD-10-CM

## 2022-04-01 DIAGNOSIS — J439 Emphysema, unspecified: Secondary | ICD-10-CM | POA: Diagnosis not present

## 2022-04-01 DIAGNOSIS — G3184 Mild cognitive impairment, so stated: Secondary | ICD-10-CM | POA: Diagnosis not present

## 2022-04-01 DIAGNOSIS — J189 Pneumonia, unspecified organism: Secondary | ICD-10-CM | POA: Diagnosis not present

## 2022-04-01 DIAGNOSIS — F339 Major depressive disorder, recurrent, unspecified: Secondary | ICD-10-CM | POA: Diagnosis not present

## 2022-04-01 DIAGNOSIS — J9621 Acute and chronic respiratory failure with hypoxia: Secondary | ICD-10-CM | POA: Diagnosis not present

## 2022-04-01 DIAGNOSIS — J849 Interstitial pulmonary disease, unspecified: Secondary | ICD-10-CM | POA: Diagnosis not present

## 2022-04-01 DIAGNOSIS — R413 Other amnesia: Secondary | ICD-10-CM | POA: Diagnosis not present

## 2022-04-01 MED ORDER — ALBUTEROL SULFATE (2.5 MG/3ML) 0.083% IN NEBU: 2.5000 mg | INHALATION_SOLUTION | RESPIRATORY_TRACT | 1 refills | Status: DC | PRN

## 2022-04-01 MED ORDER — QUETIAPINE FUMARATE ER 50 MG PO TB24: 100.0000 mg | ORAL_TABLET | Freq: Every day | ORAL | 1 refills | Status: DC

## 2022-04-01 NOTE — Progress Notes (Signed)
BP (!) 172/54   Pulse 64   Temp 98.3 F (36.8 C) (Oral)   Wt 92 lb 14.4 oz (42.1 kg)   LMP  (LMP Unknown)   SpO2 98%   BMI 24.16 kg/m    Subjective:    Patient ID: Kristi Clark, female    DOB: 13-Jun-1950, 72 y.o.   MRN: [id number]  HPI: Kristi Clark is a 72 y.o. female  No chief complaint on file.  HYPERTENSION {Blank single:19197::"without","with"} Chronic Kidney Disease Hypertension status: {Blank single:19197::"controlled","uncontrolled","better","worse","exacerbated","stable"}  Satisfied with current treatment? {Blank single:19197::"yes","no"} Duration of hypertension: {Blank single:19197::"chronic","months","years"} BP monitoring frequency:  {Blank single:19197::"not checking","rarely","daily","weekly","monthly","a few times a day","a few times a week","a few times a month"} BP range:  BP medication side effects:  {Blank single:19197::"yes","no"} Medication compliance: {Blank single:19197::"excellent compliance","good compliance","fair compliance","poor compliance"} Previous BP meds:{Blank IONGEXBM:84132::"GMWN","UUVOZDGUYQ","IHKVQQVZDG/LOVFIEPPIR","JJOACZYS","AYTKZSWFUX","NATFTDDUKG/URKY","HCWCBJSEGB (bystolic)","carvedilol","chlorthalidone","clonidine","diltiazem","exforge HCT","HCTZ","irbesartan (avapro)","labetalol","lisinopril","lisinopril-HCTZ","losartan (cozaar)","methyldopa","nifedipine","olmesartan (benicar)","olmesartan-HCTZ","quinapril","ramipril","spironalactone","tekturna","valsartan","valsartan-HCTZ","verapamil"} Aspirin: {Blank single:19197::"yes","no"} Recurrent headaches: {Blank single:19197::"yes","no"} Visual changes: {Blank single:19197::"yes","no"} Palpitations: {Blank single:19197::"yes","no"} Dyspnea: {Blank single:19197::"yes","no"} Chest pain: {Blank single:19197::"yes","no"} Lower extremity edema: {Blank single:19197::"yes","no"} Dizzy/lightheaded: {Blank single:19197::"yes","no"}  ANXIETY/DEPRESSION Duration:{Blank  single:19197::"controlled","uncontrolled","better","worse","exacerbated","stable"} Anxious mood: {Blank single:19197::"yes","no"}  Excessive worrying: {Blank single:19197::"yes","no"} Irritability: {Blank single:19197::"yes","no"}  Sweating: {Blank single:19197::"yes","no"} Nausea: {Blank single:19197::"yes","no"} Palpitations:{Blank single:19197::"yes","no"} Hyperventilation: {Blank single:19197::"yes","no"} Panic attacks: {Blank single:19197::"yes","no"} Agoraphobia: {Blank single:19197::"yes","no"}  Obscessions/compulsions: {Blank single:19197::"yes","no"} Depressed mood: {Blank single:19197::"yes","no"}    04/01/2022    2:15 PM 03/03/2022    2:12 PM 02/21/2022    1:43 PM 02/10/2022    3:20 PM 02/02/2022    1:34 PM  Depression screen PHQ 2/9  Decreased Interest 0 '2 2 3 2  '$ Down, Depressed, Hopeless '1 1 2 3 2  '$ PHQ - 2 Score '1 3 4 6 4  '$ Altered sleeping 0 '1 3 3 2  '$ Tired, decreased energy '2 3 3 3 2  '$ Change in appetite 1 0 '2 3 2  '$ Feeling bad or failure about yourself  '3 2 2 3 1  '$ Trouble concentrating 0 '2 2 2 1  '$ Moving slowly or fidgety/restless 0 1 0 3 0  Suicidal thoughts 0 0 0 1 0  PHQ-9 Score '7 12 16 24 12  '$ Difficult doing work/chores Somewhat difficult Somewhat difficult Somewhat difficult Extremely dIfficult Very difficult   Anhedonia: {Blank single:19197::"yes","no"} Weight changes: {Blank single:19197::"yes","no"} Insomnia: {Blank single:19197::"yes","no"} {Blank single:19197::"hard to fall asleep","hard to stay asleep"}  Hypersomnia: {Blank single:19197::"yes","no"} Fatigue/loss of energy: {Blank single:19197::"yes","no"} Feelings of worthlessness: {Blank single:19197::"yes","no"} Feelings of guilt: {Blank single:19197::"yes","no"} Impaired concentration/indecisiveness: {Blank single:19197::"yes","no"} Suicidal ideations: {Blank single:19197::"yes","no"}  Crying spells: {Blank single:19197::"yes","no"} Recent Stressors/Life Changes: {Blank single:19197::"yes","no"}    Relationship problems: {Blank single:19197::"yes","no"}   Family stress: {Blank single:19197::"yes","no"}     Financial stress: {Blank single:19197::"yes","no"}    Job stress: {Blank single:19197::"yes","no"}    Recent death/loss: {Blank single:19197::"yes","no"}  Relevant past medical, surgical, family and social history reviewed and updated as indicated. Interim medical history since our last visit reviewed. Allergies and medications reviewed and updated.  Review of Systems  Per HPI unless specifically indicated above     Objective:    BP (!) 172/54   Pulse 64   Temp 98.3 F (36.8 C) (Oral)   Wt 92 lb 14.4 oz (42.1 kg)   LMP  (LMP Unknown)   SpO2 98%   BMI 24.16 kg/m   Wt Readings from Last 3 Encounters:  04/01/22 92 lb 14.4 oz (42.1 kg)  03/23/22 91 lb 4.8 oz (41.4 kg)  03/15/22 99 lb 14.4 oz (45.3 kg)    Physical Exam  Results for orders placed or performed in visit on 03/15/22  WET PREP FOR TRICH, YEAST, CLUE   Specimen: Urine   Urine  Result Value Ref Range   Trichomonas Exam Negative Negative   Yeast Exam Negative Negative   Clue Cell Exam Negative Negative  Urinalysis, Routine w reflex microscopic  Result Value Ref Range   Specific Gravity, UA 1.010 1.005 - 1.030   pH, UA 7.0 5.0 - 7.5   Color, UA Yellow Yellow   Appearance Ur Clear Clear   Leukocytes,UA Negative Negative   Protein,UA 1+ (A) Negative/Trace   Glucose, UA Negative Negative   Ketones, UA Negative Negative   RBC, UA Negative Negative   Bilirubin, UA Negative Negative   Urobilinogen, Ur 0.2 0.2 - 1.0 mg/dL   Nitrite, UA Negative Negative   Microscopic Examination Comment   CBC with Differential/Platelet  Result Value Ref Range   WBC 12.2 (H) 3.4 - 10.8 x10E3/uL   RBC 3.43 (L) 3.77 - 5.28 x10E6/uL   Hemoglobin 10.3 (L) 11.1 - 15.9 g/dL   Hematocrit 29.3 (L) 34.0 - 46.6 %   MCV 85 79 - 97 fL   MCH 30.0 26.6 - 33.0 pg   MCHC 35.2 31.5 - 35.7 g/dL   RDW 14.6 11.7 - 15.4 %   Platelets  325 150 - 450 x10E3/uL   Neutrophils 88 Not Estab. %   Lymphs 8 Not Estab. %   Monocytes 2 Not Estab. %   Eos 0 Not Estab. %   Basos 0 Not Estab. %   Neutrophils Absolute 10.8 (H) 1.4 - 7.0 x10E3/uL   Lymphocytes Absolute 0.9 0.7 - 3.1 x10E3/uL   Monocytes Absolute 0.2 0.1 - 0.9 x10E3/uL   EOS (ABSOLUTE) 0.0 0.0 - 0.4 x10E3/uL   Basophils Absolute 0.0 0.0 - 0.2 x10E3/uL   Immature Granulocytes 2 Not Estab. %   Immature Grans (Abs) 0.2 (H) 0.0 - 0.1 [id number]/YH  Basic metabolic panel  Result Value Ref Range   Glucose 123 (H) 70 - 99 mg/dL   BUN 17 8 - 27 mg/dL   Creatinine, Ser 0.76 0.57 - 1.00 mg/dL   eGFR 83 >59 mL/min/1.73   BUN/Creatinine Ratio 22 12 - 28   Sodium 133 (L) 134 - 144 mmol/L   Potassium 3.6 3.5 - 5.2 mmol/L   Chloride 90 (L) 96 - 106 mmol/L   CO2 27 20 - 29 mmol/L   Calcium 9.3 8.7 - 10.3 mg/dL      Assessment & Plan:   Problem List Items Addressed This Visit   None    Follow up plan: No follow-ups on file.

## 2022-04-01 NOTE — Telephone Encounter (Addendum)
Message for Dr. Wynetta Emery.   Pt has an appt. With Dr. Wynetta Emery today at 2:00. See below message from Collie Siad, nurse with [facility] reporting that pt. Fell Sunday 03/27/2022 and last night.   BP is elevated and she has an injury from falling.   Reason for Disposition  [1] Follow-up call to recent contact AND [2] information only call, no triage required    FYI for Dr. Wynetta Emery for pt's visit today at 2:00 (1/26)  Answer Assessment - Initial Assessment Questions 1. REASON FOR CALL or QUESTION: "What is your reason for calling today?" or "How can I best help you?" or "What question do you have that I can help answer?"  Pt has a 2:00 appt. With Dr. Park Liter today (1/26).      See message from Collie Siad, nurse with [facility] wanted Dr. Wynetta Emery to be aware of the falls, BP and injury.  Pt has fallen twice, BP is elevated and she sustained an injury during one of the falls.  Protocols used: Information Only Call - No Triage-A-[facility]  I called over and spoke with Ubaldo Glassing at the practice requesting that Dr. Wynetta Emery be made aware of this information for this pt's 2:00 appt.

## 2022-04-02 NOTE — Assessment & Plan Note (Signed)
Significantly better. Will continue '100mg'$  seroquel. Continue to monitor. Call with any concerns.

## 2022-04-02 NOTE — Assessment & Plan Note (Signed)
Better on recheck. Diastolic very low. Will leave medication where it is. Follow up with cardiology as scheduled. Call with any concerns.

## 2022-04-07 ENCOUNTER — Encounter: Payer: Self-pay | Admitting: Family Medicine

## 2022-04-07 DIAGNOSIS — J9621 Acute and chronic respiratory failure with hypoxia: Secondary | ICD-10-CM | POA: Diagnosis not present

## 2022-04-07 DIAGNOSIS — J189 Pneumonia, unspecified organism: Secondary | ICD-10-CM | POA: Diagnosis not present

## 2022-04-07 DIAGNOSIS — J439 Emphysema, unspecified: Secondary | ICD-10-CM | POA: Diagnosis not present

## 2022-04-07 DIAGNOSIS — J849 Interstitial pulmonary disease, unspecified: Secondary | ICD-10-CM | POA: Diagnosis not present

## 2022-04-07 DIAGNOSIS — R413 Other amnesia: Secondary | ICD-10-CM | POA: Diagnosis not present

## 2022-04-07 DIAGNOSIS — G3184 Mild cognitive impairment, so stated: Secondary | ICD-10-CM | POA: Diagnosis not present

## 2022-04-08 DIAGNOSIS — J439 Emphysema, unspecified: Secondary | ICD-10-CM | POA: Diagnosis not present

## 2022-04-08 DIAGNOSIS — R413 Other amnesia: Secondary | ICD-10-CM | POA: Diagnosis not present

## 2022-04-08 DIAGNOSIS — J9621 Acute and chronic respiratory failure with hypoxia: Secondary | ICD-10-CM | POA: Diagnosis not present

## 2022-04-08 DIAGNOSIS — G3184 Mild cognitive impairment, so stated: Secondary | ICD-10-CM | POA: Diagnosis not present

## 2022-04-08 DIAGNOSIS — J849 Interstitial pulmonary disease, unspecified: Secondary | ICD-10-CM | POA: Diagnosis not present

## 2022-04-08 DIAGNOSIS — J189 Pneumonia, unspecified organism: Secondary | ICD-10-CM | POA: Diagnosis not present

## 2022-04-08 MED ORDER — QUETIAPINE FUMARATE ER 50 MG PO TB24: 100.0000 mg | ORAL_TABLET | Freq: Every day | ORAL | 1 refills | Status: DC

## 2022-04-11 ENCOUNTER — Telehealth: Payer: Self-pay | Admitting: Family Medicine

## 2022-04-11 DIAGNOSIS — J9621 Acute and chronic respiratory failure with hypoxia: Secondary | ICD-10-CM | POA: Diagnosis not present

## 2022-04-11 DIAGNOSIS — F32A Depression, unspecified: Secondary | ICD-10-CM | POA: Diagnosis not present

## 2022-04-11 DIAGNOSIS — J189 Pneumonia, unspecified organism: Secondary | ICD-10-CM | POA: Diagnosis not present

## 2022-04-11 DIAGNOSIS — E440 Moderate protein-calorie malnutrition: Secondary | ICD-10-CM | POA: Diagnosis not present

## 2022-04-11 DIAGNOSIS — I480 Paroxysmal atrial fibrillation: Secondary | ICD-10-CM | POA: Diagnosis not present

## 2022-04-11 DIAGNOSIS — I5032 Chronic diastolic (congestive) heart failure: Secondary | ICD-10-CM | POA: Diagnosis not present

## 2022-04-11 DIAGNOSIS — E785 Hyperlipidemia, unspecified: Secondary | ICD-10-CM | POA: Diagnosis not present

## 2022-04-11 DIAGNOSIS — I130 Hypertensive heart and chronic kidney disease with heart failure and stage 1 through stage 4 chronic kidney disease, or unspecified chronic kidney disease: Secondary | ICD-10-CM | POA: Diagnosis not present

## 2022-04-11 DIAGNOSIS — F17219 Nicotine dependence, cigarettes, with unspecified nicotine-induced disorders: Secondary | ICD-10-CM | POA: Diagnosis not present

## 2022-04-11 DIAGNOSIS — G3184 Mild cognitive impairment, so stated: Secondary | ICD-10-CM | POA: Diagnosis not present

## 2022-04-11 DIAGNOSIS — N182 Chronic kidney disease, stage 2 (mild): Secondary | ICD-10-CM | POA: Diagnosis not present

## 2022-04-11 DIAGNOSIS — D631 Anemia in chronic kidney disease: Secondary | ICD-10-CM | POA: Diagnosis not present

## 2022-04-11 DIAGNOSIS — R1310 Dysphagia, unspecified: Secondary | ICD-10-CM | POA: Diagnosis not present

## 2022-04-11 DIAGNOSIS — I700 Atherosclerosis of aorta: Secondary | ICD-10-CM | POA: Diagnosis not present

## 2022-04-11 DIAGNOSIS — R296 Repeated falls: Secondary | ICD-10-CM | POA: Diagnosis not present

## 2022-04-11 DIAGNOSIS — R634 Abnormal weight loss: Secondary | ICD-10-CM | POA: Diagnosis not present

## 2022-04-11 DIAGNOSIS — J449 Chronic obstructive pulmonary disease, unspecified: Secondary | ICD-10-CM | POA: Diagnosis not present

## 2022-04-11 DIAGNOSIS — R911 Solitary pulmonary nodule: Secondary | ICD-10-CM | POA: Diagnosis not present

## 2022-04-11 DIAGNOSIS — R54 Age-related physical debility: Secondary | ICD-10-CM | POA: Diagnosis not present

## 2022-04-11 DIAGNOSIS — R413 Other amnesia: Secondary | ICD-10-CM | POA: Diagnosis not present

## 2022-04-11 DIAGNOSIS — J309 Allergic rhinitis, unspecified: Secondary | ICD-10-CM | POA: Diagnosis not present

## 2022-04-11 DIAGNOSIS — J439 Emphysema, unspecified: Secondary | ICD-10-CM | POA: Diagnosis not present

## 2022-04-11 DIAGNOSIS — E871 Hypo-osmolality and hyponatremia: Secondary | ICD-10-CM | POA: Diagnosis not present

## 2022-04-11 DIAGNOSIS — J849 Interstitial pulmonary disease, unspecified: Secondary | ICD-10-CM | POA: Diagnosis not present

## 2022-04-11 DIAGNOSIS — I2510 Atherosclerotic heart disease of native coronary artery without angina pectoris: Secondary | ICD-10-CM | POA: Diagnosis not present

## 2022-04-11 DIAGNOSIS — K219 Gastro-esophageal reflux disease without esophagitis: Secondary | ICD-10-CM | POA: Diagnosis not present

## 2022-04-11 NOTE — Telephone Encounter (Signed)
Copied from [place] [id number]. Topic: General - Other >> Apr 11, 2022  8:48 AM [place] B wrote: Reason for CRM: tom from [facility] clled in sates pt missed OT appt last thursday

## 2022-04-13 DIAGNOSIS — J849 Interstitial pulmonary disease, unspecified: Secondary | ICD-10-CM | POA: Diagnosis not present

## 2022-04-13 DIAGNOSIS — R413 Other amnesia: Secondary | ICD-10-CM | POA: Diagnosis not present

## 2022-04-13 DIAGNOSIS — J9621 Acute and chronic respiratory failure with hypoxia: Secondary | ICD-10-CM | POA: Diagnosis not present

## 2022-04-13 DIAGNOSIS — J189 Pneumonia, unspecified organism: Secondary | ICD-10-CM | POA: Diagnosis not present

## 2022-04-13 DIAGNOSIS — G3184 Mild cognitive impairment, so stated: Secondary | ICD-10-CM | POA: Diagnosis not present

## 2022-04-13 DIAGNOSIS — J439 Emphysema, unspecified: Secondary | ICD-10-CM | POA: Diagnosis not present

## 2022-04-15 ENCOUNTER — Encounter: Payer: Self-pay | Admitting: Family Medicine

## 2022-04-15 ENCOUNTER — Ambulatory Visit: Payer: Self-pay

## 2022-04-15 ENCOUNTER — Ambulatory Visit (INDEPENDENT_AMBULATORY_CARE_PROVIDER_SITE_OTHER): Payer: Medicare Other | Admitting: Family Medicine

## 2022-04-15 VITALS — BP 137/57 | HR 72 | Temp 98.7°F | Ht <= 58 in | Wt 94.7 lb

## 2022-04-15 DIAGNOSIS — R051 Acute cough: Secondary | ICD-10-CM | POA: Diagnosis not present

## 2022-04-15 MED ORDER — IPRATROPIUM-ALBUTEROL 0.5-2.5 (3) MG/3ML IN SOLN
3.0000 mL | Freq: Once | RESPIRATORY_TRACT | Status: AC
  Administered 2022-04-15: 3 mL via RESPIRATORY_TRACT

## 2022-04-15 NOTE — Telephone Encounter (Signed)
  Pt asked if she can take Delsom with her other medications / for her cough / please advise   Pt.. Currently in office for visit about cough. No triage needed.

## 2022-04-15 NOTE — Progress Notes (Signed)
BP (!) 137/57   Pulse 72   Temp 98.7 F (37.1 C) (Oral)   Ht 4' 4"$  (1.321 m)   Wt 94 lb 11.2 oz (43 kg)   LMP  (LMP Unknown)   SpO2 94%   BMI 24.62 kg/m    Subjective:    Patient ID: Kristi Clark, female    DOB: February 21, 1951, 72 y.o.   MRN: [id number]  HPI: Kristi Clark is a 72 y.o. female  Chief Complaint  Patient presents with   Cough    Patient says for the past two nights she has been coughing really. Patient is asking if she can take Delsym with her current medications. Patient says she notices the cough when she has been up and moving around.    COUGH Duration: {Blank single:19197::"days","weeks","months"} Circumstances of initial development of cough: {Blank single:19197::"nothing","unknown","URI"} Cough severity: {Blank single:19197::"mild","moderate","severe"} Cough description: {Blank multiple:19196::"productive","non-productive","hacking","barking","dry","wet","irritating"} Aggravating factors:  {Blank multiple:19196::"nothing","worse at night","worse in the AM","talking","exercise"} Alleviating factors: {Blank multiple:19196::"nothing","cold/sinus","mucinex","cough syrup","antibiotics","albuterol","nasal CCS"} Status:  {Blank multiple:19196::"better","worse","stable","fluctuating"} Treatments attempted: {Blank multiple:19196::"none","cold/sinus","mucinex","cough syrup","antibiotics","albuterol","nasal CCS"} Wheezing: {Blank single:19197::"yes","no"} Shortness of breath: {Blank single:19197::"yes","no"} Chest pain: {Blank single:19197::"yes","no"} Chest tightness:{Blank single:19197::"yes","no"} Nasal congestion: {Blank single:19197::"yes","no"} Runny nose: {Blank single:19197::"yes","no"} Postnasal drip: {Blank single:19197::"yes","no"} Frequent throat clearing or swallowing: {Blank single:19197::"yes","no"} Hemoptysis: {Blank single:19197::"yes","no"} Fevers: {Blank single:19197::"yes","no"} Night sweats: {Blank single:19197::"yes","no"} Weight  loss: {Blank single:19197::"yes","no"} Heartburn: {Blank single:19197::"yes","no"} Recent foreign travel: {Blank single:19197::"yes","no"} Tuberculosis contacts: {Blank single:19197::"yes","no"}  Relevant past medical, surgical, family and social history reviewed and updated as indicated. Interim medical history since our last visit reviewed. Allergies and medications reviewed and updated.  Review of Systems  Per HPI unless specifically indicated above     Objective:    BP (!) 137/57   Pulse 72   Temp 98.7 F (37.1 C) (Oral)   Ht 4' 4"$  (1.321 m)   Wt 94 lb 11.2 oz (43 kg)   LMP  (LMP Unknown)   SpO2 94%   BMI 24.62 kg/m   Wt Readings from Last 3 Encounters:  04/15/22 94 lb 11.2 oz (43 kg)  04/01/22 92 lb 14.4 oz (42.1 kg)  03/23/22 91 lb 4.8 oz (41.4 kg)    Physical Exam  Results for orders placed or performed in visit on 03/15/22  WET PREP FOR [place], YEAST, CLUE   Specimen: Urine   Urine  Result Value Ref Range   Trichomonas Exam Negative Negative   Yeast Exam Negative Negative   Clue Cell Exam Negative Negative  Urinalysis, Routine w reflex microscopic  Result Value Ref Range   Specific Gravity, UA 1.010 1.005 - 1.030   pH, UA 7.0 5.0 - 7.5   Color, UA Yellow Yellow   Appearance Ur Clear Clear   Leukocytes,UA Negative Negative   Protein,UA 1+ (A) Negative/Trace   Glucose, UA Negative Negative   Ketones, UA Negative Negative   RBC, UA Negative Negative   Bilirubin, UA Negative Negative   Urobilinogen, Ur 0.2 0.2 - 1.0 mg/dL   Nitrite, UA Negative Negative   Microscopic Examination Comment   CBC with Differential/Platelet  Result Value Ref Range   WBC 12.2 (H) 3.4 - 10.8 x10E3/uL   RBC 3.43 (L) 3.77 - 5.28 x10E6/uL   Hemoglobin 10.3 (L) 11.1 - 15.9 g/dL   Hematocrit 29.3 (L) 34.0 - 46.6 %   MCV 85 79 - 97 fL   MCH 30.0 26.6 - 33.0 pg   MCHC 35.2 31.5 - 35.7 g/dL   RDW 14.6 11.7 - 15.4 %   Platelets 325 150 -  450 x10E3/uL   Neutrophils 88 Not Estab. %    Lymphs 8 Not Estab. %   Monocytes 2 Not Estab. %   Eos 0 Not Estab. %   Basos 0 Not Estab. %   Neutrophils Absolute 10.8 (H) 1.4 - 7.0 x10E3/uL   Lymphocytes Absolute 0.9 0.7 - 3.1 x10E3/uL   Monocytes Absolute 0.2 0.1 - 0.9 x10E3/uL   EOS (ABSOLUTE) 0.0 0.0 - 0.4 x10E3/uL   Basophils Absolute 0.0 0.0 - 0.2 x10E3/uL   Immature Granulocytes 2 Not Estab. %   Immature Grans (Abs) 0.2 (H) 0.0 - 0.1 [id number]  Basic metabolic panel  Result Value Ref Range   Glucose 123 (H) 70 - 99 mg/dL   BUN 17 8 - 27 mg/dL   Creatinine, Ser 0.76 0.57 - 1.00 mg/dL   eGFR 83 >59 mL/min/1.73   BUN/Creatinine Ratio 22 12 - 28   Sodium 133 (L) 134 - 144 mmol/L   Potassium 3.6 3.5 - 5.2 mmol/L   Chloride 90 (L) 96 - 106 mmol/L   CO2 27 20 - 29 mmol/L   Calcium 9.3 8.7 - 10.3 mg/dL      Assessment & Plan:   Problem List Items Addressed This Visit   None    Follow up plan: No follow-ups on file.

## 2022-04-16 LAB — RESPIRATORY PANEL W/ SARS-COV2

## 2022-04-17 ENCOUNTER — Encounter: Payer: Self-pay | Admitting: Family Medicine

## 2022-04-18 ENCOUNTER — Ambulatory Visit: Payer: Self-pay

## 2022-04-18 ENCOUNTER — Other Ambulatory Visit: Payer: Self-pay

## 2022-04-18 ENCOUNTER — Emergency Department: Payer: Medicare Other

## 2022-04-18 ENCOUNTER — Inpatient Hospital Stay
Admission: EM | Admit: 2022-04-18 | Discharge: 2022-04-21 | DRG: 871 | Disposition: A | Discharge status: Home Health | Payer: Medicare Other | Attending: Hospitalist | Admitting: Hospitalist

## 2022-04-18 ENCOUNTER — Encounter: Payer: Self-pay | Admitting: Family Medicine

## 2022-04-18 DIAGNOSIS — Z66 Do not resuscitate: Secondary | ICD-10-CM | POA: Diagnosis not present

## 2022-04-18 DIAGNOSIS — J180 Bronchopneumonia, unspecified organism: Secondary | ICD-10-CM | POA: Diagnosis present

## 2022-04-18 DIAGNOSIS — J849 Interstitial pulmonary disease, unspecified: Secondary | ICD-10-CM | POA: Diagnosis not present

## 2022-04-18 DIAGNOSIS — Z79899 Other long term (current) drug therapy: Secondary | ICD-10-CM | POA: Diagnosis not present

## 2022-04-18 DIAGNOSIS — A419 Sepsis, unspecified organism: Principal | ICD-10-CM | POA: Diagnosis present

## 2022-04-18 DIAGNOSIS — R413 Other amnesia: Secondary | ICD-10-CM | POA: Diagnosis not present

## 2022-04-18 DIAGNOSIS — J440 Chronic obstructive pulmonary disease with acute lower respiratory infection: Secondary | ICD-10-CM | POA: Diagnosis present

## 2022-04-18 DIAGNOSIS — J438 Other emphysema: Secondary | ICD-10-CM | POA: Diagnosis not present

## 2022-04-18 DIAGNOSIS — Z9049 Acquired absence of other specified parts of digestive tract: Secondary | ICD-10-CM | POA: Diagnosis not present

## 2022-04-18 DIAGNOSIS — J9621 Acute and chronic respiratory failure with hypoxia: Secondary | ICD-10-CM | POA: Diagnosis present

## 2022-04-18 DIAGNOSIS — Z1152 Encounter for screening for COVID-19: Secondary | ICD-10-CM | POA: Diagnosis not present

## 2022-04-18 DIAGNOSIS — J90 Pleural effusion, not elsewhere classified: Secondary | ICD-10-CM | POA: Diagnosis not present

## 2022-04-18 DIAGNOSIS — R6520 Severe sepsis without septic shock: Secondary | ICD-10-CM | POA: Diagnosis present

## 2022-04-18 DIAGNOSIS — R0602 Shortness of breath: Secondary | ICD-10-CM | POA: Diagnosis not present

## 2022-04-18 DIAGNOSIS — K219 Gastro-esophageal reflux disease without esophagitis: Secondary | ICD-10-CM | POA: Diagnosis present

## 2022-04-18 DIAGNOSIS — I4891 Unspecified atrial fibrillation: Secondary | ICD-10-CM | POA: Diagnosis present

## 2022-04-18 DIAGNOSIS — E876 Hypokalemia: Secondary | ICD-10-CM | POA: Diagnosis present

## 2022-04-18 DIAGNOSIS — R062 Wheezing: Secondary | ICD-10-CM | POA: Diagnosis not present

## 2022-04-18 DIAGNOSIS — J189 Pneumonia, unspecified organism: Secondary | ICD-10-CM | POA: Diagnosis not present

## 2022-04-18 DIAGNOSIS — Z87891 Personal history of nicotine dependence: Secondary | ICD-10-CM | POA: Diagnosis not present

## 2022-04-18 DIAGNOSIS — J441 Chronic obstructive pulmonary disease with (acute) exacerbation: Secondary | ICD-10-CM | POA: Diagnosis present

## 2022-04-18 DIAGNOSIS — E785 Hyperlipidemia, unspecified: Secondary | ICD-10-CM | POA: Diagnosis present

## 2022-04-18 DIAGNOSIS — Z9981 Dependence on supplemental oxygen: Secondary | ICD-10-CM | POA: Diagnosis not present

## 2022-04-18 DIAGNOSIS — R109 Unspecified abdominal pain: Secondary | ICD-10-CM | POA: Diagnosis not present

## 2022-04-18 DIAGNOSIS — J439 Emphysema, unspecified: Secondary | ICD-10-CM | POA: Diagnosis present

## 2022-04-18 DIAGNOSIS — Z90710 Acquired absence of both cervix and uterus: Secondary | ICD-10-CM

## 2022-04-18 DIAGNOSIS — I5033 Acute on chronic diastolic (congestive) heart failure: Secondary | ICD-10-CM | POA: Diagnosis present

## 2022-04-18 DIAGNOSIS — I700 Atherosclerosis of aorta: Secondary | ICD-10-CM | POA: Diagnosis present

## 2022-04-18 DIAGNOSIS — Z681 Body mass index (BMI) 19 or less, adult: Secondary | ICD-10-CM

## 2022-04-18 DIAGNOSIS — D631 Anemia in chronic kidney disease: Secondary | ICD-10-CM | POA: Diagnosis present

## 2022-04-18 DIAGNOSIS — R000 Tachycardia, unspecified: Secondary | ICD-10-CM | POA: Diagnosis not present

## 2022-04-18 DIAGNOSIS — I110 Hypertensive heart disease with heart failure: Secondary | ICD-10-CM | POA: Diagnosis present

## 2022-04-18 DIAGNOSIS — I2510 Atherosclerotic heart disease of native coronary artery without angina pectoris: Secondary | ICD-10-CM | POA: Diagnosis present

## 2022-04-18 DIAGNOSIS — E440 Moderate protein-calorie malnutrition: Secondary | ICD-10-CM | POA: Diagnosis not present

## 2022-04-18 DIAGNOSIS — G3184 Mild cognitive impairment, so stated: Secondary | ICD-10-CM | POA: Diagnosis not present

## 2022-04-18 DIAGNOSIS — R069 Unspecified abnormalities of breathing: Secondary | ICD-10-CM | POA: Diagnosis not present

## 2022-04-18 DIAGNOSIS — R0689 Other abnormalities of breathing: Secondary | ICD-10-CM | POA: Diagnosis not present

## 2022-04-18 HISTORY — DX: Depression, unspecified: F32.A

## 2022-04-18 HISTORY — DX: Chronic obstructive pulmonary disease, unspecified: J44.9

## 2022-04-18 HISTORY — DX: Atherosclerotic heart disease of native coronary artery without angina pectoris: I25.10

## 2022-04-18 HISTORY — DX: Cardiac arrhythmia, unspecified: I49.9

## 2022-04-18 LAB — CBC
HCT: 28.7 % — ABNORMAL LOW (ref 36.0–46.0)
Hemoglobin: 9.3 g/dL — ABNORMAL LOW (ref 12.0–15.0)
MCH: 30.4 pg (ref 26.0–34.0)
MCHC: 32.4 g/dL (ref 30.0–36.0)
MCV: 93.8 fL (ref 80.0–100.0)
Platelets: 318 10*3/uL (ref 150–400)
RBC: 3.06 MIL/uL — ABNORMAL LOW (ref 3.87–5.11)
RDW: 16.1 % — ABNORMAL HIGH (ref 11.5–15.5)
WBC: 14 10*3/uL — ABNORMAL HIGH (ref 4.0–10.5)
nRBC: 0 % (ref 0.0–0.2)

## 2022-04-18 LAB — RESP PANEL BY RT-PCR (RSV, FLU A&B, COVID)  RVPGX2
Influenza A by PCR: NEGATIVE
Influenza B by PCR: NEGATIVE
Resp Syncytial Virus by PCR: NEGATIVE
SARS Coronavirus 2 by RT PCR: NEGATIVE

## 2022-04-18 LAB — URINALYSIS, W/ REFLEX TO CULTURE (INFECTION SUSPECTED)
Bacteria, UA: NONE SEEN
Bilirubin Urine: NEGATIVE
Glucose, UA: NEGATIVE mg/dL
Hgb urine dipstick: NEGATIVE
Ketones, ur: NEGATIVE mg/dL
Leukocytes,Ua: NEGATIVE
Nitrite: NEGATIVE
Protein, ur: 30 mg/dL — AB
Specific Gravity, Urine: 1.017 (ref 1.005–1.030)
pH: 6 (ref 5.0–8.0)

## 2022-04-18 LAB — COMPREHENSIVE METABOLIC PANEL
ALT: 77 U/L — ABNORMAL HIGH (ref 0–44)
AST: 83 U/L — ABNORMAL HIGH (ref 15–41)
Albumin: 2.9 g/dL — ABNORMAL LOW (ref 3.5–5.0)
Alkaline Phosphatase: 113 U/L (ref 38–126)
Anion gap: 12 (ref 5–15)
BUN: 16 mg/dL (ref 8–23)
CO2: 30 mmol/L (ref 22–32)
Calcium: 9.2 mg/dL (ref 8.9–10.3)
Chloride: 90 mmol/L — ABNORMAL LOW (ref 98–111)
Creatinine, Ser: 0.72 mg/dL (ref 0.44–1.00)
GFR, Estimated: >60 mL/min (ref >60)
Glucose, Bld: 109 mg/dL — ABNORMAL HIGH (ref 70–99)
Potassium: 3.4 mmol/L — ABNORMAL LOW (ref 3.5–5.1)
Sodium: 132 mmol/L — ABNORMAL LOW (ref 135–145)
Total Bilirubin: 0.7 mg/dL (ref 0.3–1.2)
Total Protein: 7.3 g/dL (ref 6.5–8.1)

## 2022-04-18 LAB — BRAIN NATRIURETIC PEPTIDE: B Natriuretic Peptide: 746.3 pg/mL — ABNORMAL HIGH (ref 0.0–100.0)

## 2022-04-18 LAB — T4, FREE: Free T4: 1.31 ng/dL — ABNORMAL HIGH (ref 0.61–1.12)

## 2022-04-18 LAB — MAGNESIUM: Magnesium: 2.1 mg/dL (ref 1.7–2.4)

## 2022-04-18 LAB — TSH: TSH: 0.701 u[IU]/mL (ref 0.350–4.500)

## 2022-04-18 LAB — TROPONIN I (HIGH SENSITIVITY)
Troponin I (High Sensitivity): 45 ng/L — ABNORMAL HIGH (ref <18)
Troponin I (High Sensitivity): 35 ng/L — ABNORMAL HIGH (ref <18)

## 2022-04-18 MED ORDER — MOMETASONE FURO-FORMOTEROL FUM 200-5 MCG/ACT IN AERO
2.0000 | INHALATION_SPRAY | Freq: Two times a day (BID) | RESPIRATORY_TRACT | Status: DC
  Administered 2022-04-18 – 2022-04-21 (×6): 2 via RESPIRATORY_TRACT
  Filled 2022-04-18: qty 8.8

## 2022-04-18 MED ORDER — ACETAMINOPHEN 650 MG RE SUPP: 650.0000 mg | Freq: Four times a day (QID) | RECTAL | Status: DC | PRN

## 2022-04-18 MED ORDER — SODIUM CHLORIDE 0.9% FLUSH: 3.0000 mL | INTRAVENOUS | Status: DC | PRN

## 2022-04-18 MED ORDER — SODIUM CHLORIDE 0.9 % IV SOLN: 250.0000 mL | INTRAVENOUS | Status: DC | PRN

## 2022-04-18 MED ORDER — IPRATROPIUM-ALBUTEROL 0.5-2.5 (3) MG/3ML IN SOLN
3.0000 mL | Freq: Once | RESPIRATORY_TRACT | Status: AC
  Administered 2022-04-18: 3 mL via RESPIRATORY_TRACT
  Filled 2022-04-18: qty 3

## 2022-04-18 MED ORDER — POTASSIUM CHLORIDE CRYS ER 20 MEQ PO TBCR
40.0000 meq | EXTENDED_RELEASE_TABLET | Freq: Every day | ORAL | Status: DC
  Administered 2022-04-18 – 2022-04-20 (×3): 40 meq via ORAL
  Filled 2022-04-18 (×3): qty 2

## 2022-04-18 MED ORDER — ONDANSETRON HCL 4 MG/2ML IJ SOLN: 4.0000 mg | Freq: Four times a day (QID) | INTRAMUSCULAR | Status: DC | PRN

## 2022-04-18 MED ORDER — FUROSEMIDE 10 MG/ML IJ SOLN
40.0000 mg | Freq: Once | INTRAMUSCULAR | Status: AC
  Administered 2022-04-18: 40 mg via INTRAVENOUS
  Filled 2022-04-18: qty 4

## 2022-04-18 MED ORDER — ENOXAPARIN SODIUM 40 MG/0.4ML IJ SOSY
40.0000 mg | PREFILLED_SYRINGE | Freq: Two times a day (BID) | INTRAMUSCULAR | Status: DC
  Administered 2022-04-18 – 2022-04-20 (×4): 40 mg via SUBCUTANEOUS
  Filled 2022-04-18 (×4): qty 0.4

## 2022-04-18 MED ORDER — PREDNISONE 20 MG PO TABS
40.0000 mg | ORAL_TABLET | Freq: Every day | ORAL | Status: DC
  Administered 2022-04-19 – 2022-04-21 (×3): 40 mg via ORAL
  Filled 2022-04-18 (×3): qty 2

## 2022-04-18 MED ORDER — FUROSEMIDE 10 MG/ML IJ SOLN
40.0000 mg | Freq: Every day | INTRAMUSCULAR | Status: DC
  Administered 2022-04-19 – 2022-04-21 (×3): 40 mg via INTRAVENOUS
  Filled 2022-04-18 (×3): qty 4

## 2022-04-18 MED ORDER — ACETAMINOPHEN 325 MG PO TABS
650.0000 mg | ORAL_TABLET | Freq: Four times a day (QID) | ORAL | Status: DC | PRN
  Administered 2022-04-19: 650 mg via ORAL
  Filled 2022-04-18: qty 2

## 2022-04-18 MED ORDER — ONDANSETRON HCL 4 MG PO TABS: 4.0000 mg | ORAL_TABLET | Freq: Four times a day (QID) | ORAL | Status: DC | PRN

## 2022-04-18 MED ORDER — IOHEXOL 350 MG/ML SOLN
100.0000 mL | Freq: Once | INTRAVENOUS | Status: AC | PRN
  Administered 2022-04-18: 75 mL via INTRAVENOUS

## 2022-04-18 MED ORDER — METOPROLOL SUCCINATE ER 50 MG PO TB24
50.0000 mg | ORAL_TABLET | Freq: Every day | ORAL | Status: DC
  Administered 2022-04-18 – 2022-04-21 (×4): 50 mg via ORAL
  Filled 2022-04-18 (×4): qty 1

## 2022-04-18 MED ORDER — SODIUM CHLORIDE 0.9% FLUSH
3.0000 mL | Freq: Two times a day (BID) | INTRAVENOUS | Status: DC
  Administered 2022-04-18 – 2022-04-21 (×6): 3 mL via INTRAVENOUS

## 2022-04-18 MED ORDER — SODIUM CHLORIDE 0.9 % IV SOLN
500.0000 mg | INTRAVENOUS | Status: DC
  Administered 2022-04-18: 500 mg via INTRAVENOUS
  Filled 2022-04-18: qty 500

## 2022-04-18 MED ORDER — IPRATROPIUM-ALBUTEROL 0.5-2.5 (3) MG/3ML IN SOLN
3.0000 mL | Freq: Four times a day (QID) | RESPIRATORY_TRACT | Status: DC | PRN
  Administered 2022-04-19 – 2022-04-20 (×6): 3 mL via RESPIRATORY_TRACT
  Filled 2022-04-18 (×6): qty 3

## 2022-04-18 MED ORDER — SODIUM CHLORIDE 0.9 % IV SOLN
2.0000 g | INTRAVENOUS | Status: DC
  Administered 2022-04-18 – 2022-04-20 (×3): 2 g via INTRAVENOUS
  Filled 2022-04-18 (×2): qty 20
  Filled 2022-04-18: qty 2
  Filled 2022-04-18: qty 20

## 2022-04-18 MED ORDER — METHYLPREDNISOLONE SODIUM SUCC 125 MG IJ SOLR
125.0000 mg | Freq: Once | INTRAMUSCULAR | Status: AC
  Administered 2022-04-18: 125 mg via INTRAVENOUS
  Filled 2022-04-18: qty 2

## 2022-04-18 MED ORDER — ASPIRIN 81 MG PO TBEC
81.0000 mg | DELAYED_RELEASE_TABLET | Freq: Every day | ORAL | Status: DC
  Administered 2022-04-18 – 2022-04-21 (×4): 81 mg via ORAL
  Filled 2022-04-18 (×4): qty 1

## 2022-04-18 NOTE — ED Notes (Signed)
Bladder Scan resulted at 291 mL

## 2022-04-18 NOTE — Assessment & Plan Note (Addendum)
Patient presents for evaluation of worsening shortness of breath associated with a cough productive of thick yellow phlegm and intermittent fever and chills at home. On admission she was noted to be tachypneic, tachycardic, has a white count of 14,000 and chest x-ray findings suggestive of multifocal pneumonia. Lactic acid is pending Patient did not receive IV fluid resuscitation due to findings suggestive of acute CHF We will treat patient empirically with Rocephin and Zithromax Obtain blood and sputum cultures

## 2022-04-18 NOTE — Assessment & Plan Note (Signed)
Patient with a history of worsening shortness of breath compared to her baseline. Imaging shows findings suggestive of pulmonary edema with bilateral pleural effusions Last known LVEF of 55 to 60% from a 2D echocardiogram which was done 11/23 which also shows evidence of grade 2 diastolic dysfunction Acute CHF exacerbation appears to be secondary to rapid atrial fibrillation Placed send on Lasix 40 mg IV daily Start metoprolol for rate control

## 2022-04-18 NOTE — Assessment & Plan Note (Signed)
Treated and resolved 

## 2022-04-18 NOTE — Assessment & Plan Note (Signed)
Patient with a known history of atrial fibrillation We will place patient on metoprolol 50 mg daily Start patient on Lovenox 1 mg/kg body weight subcu every 12

## 2022-04-18 NOTE — H&P (Addendum)
History and Physical    Patient: Kristi Clark [id number] DOB: [birth year] DOA: 04/18/2022 DOS: the patient was seen and examined on 04/18/2022 PCP: [name], DO  Patient coming from: Home  Chief Complaint:  Chief Complaint  Patient presents with   Shortness of Breath   Most of the history was obtained from her son at the bedside HPI: Kristi Clark is a 72 y.o. 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 Patient states that she has not felt well for about 5 days and was seen by her doctor 3 days prior to this hospitalization for evaluation of worsening shortness of breath.  According to her son she was swabbed at the office but when they checked the portal all the results were canceled because they had used the wrong swab.  They were concerned that she may have a viral illness contributing to her symptoms.  Son states that she has had poor oral intake, intermittent fever and chills and notes that with ambulation her pulse oximetry on 3 L dropped to about 79%. Patient states that she is unable to lay flat in bed due to shortness of breath and has a cough productive of thick yellow phlegm. He also complains of difficulty voiding and son notes that she has had constipation.  Bladder scan in the ER yielded 291 cc of urine. She denies having any chest pain, no abdominal pain, no leg swelling, no headache, no dysuria, no hematuria, no nausea, no vomiting, no blurred vision or focal deficit. Abnormal labs include a BNP of 746, potassium 3.4, AST 83, ALT 77, white count 14, hemoglobin 9.3, Respiratory viral panel was negative Chest  x-ray reviewed by me shows  patchy parenchymal lung densities particularly in the right lung. Findings are concerning for multifocal pneumonia. Cannot exclude pulmonary nodules in the right mid lung region. Recommend further characterization with a chest CT with IV contrast. Probable small bilateral pleural effusions. Diffuse interstitial lung markings. CT scan of abdomen and pelvis with contrast shows no acute CT findings of the abdomen or pelvis to explain abdominal pain. Anasarca. Status post cholecystectomy and hysterectomy. CT angiogram of the chest with contrast was negative examination for pulmonary embolism. Severe emphysema. Diffuse bilateral bronchial wall thickening and extensive bronchiolar plugging in the lower lobes. Interlobular septal thickening throughout the lungs with small bilateral pleural effusions, consistent with pulmonary edema. Numerous enlarged mediastinal and hilar lymph nodes, not significantly changed compared to prior examination and likely reactive. Cardiomegaly and coronary artery disease. Twelve-lead EKG reviewed by me shows atrial fibrillation with a rapid ventricular rate. Patient was tachycardic, tachypneic with a white count of 14,000 and x-ray findings suggestive of pneumonia. Room air pulse oximetry was 86% on 3 L and she is currently on 5 L of oxygen maintaining pulse oximetry greater than 92%. She received a dose of IV Solu-Medrol and Lasix in the ER and will be admitted to the hospital for further evaluation.  Review of Systems: As mentioned in the history of present illness. All other systems reviewed and are negative. Past Medical History:  Diagnosis Date   COPD (chronic obstructive pulmonary disease) ([place])    Coronary artery disease    Depression    Dysrhythmia  Past Surgical History:  Procedure Laterality Date   CORONARY ANGIOPLASTY     Social History:  reports that she has quit smoking. Her smoking use included cigarettes. She does not have any  smokeless tobacco history on file. She reports that she does not use drugs. No history on file for alcohol use.  Not on File  History reviewed. No pertinent family history.  Prior to Admission medications   Not on File    Physical Exam: Vitals:   04/18/22 1930 04/18/22 1953 04/18/22 2000 04/18/22 2050  BP: (!) 141/52  (!) 125/57 (!) 145/58  Pulse: (!) 130  (!) 103 (!) 105  Resp: (!) 24  (!) 25 (!) 22  Temp:  98.6 F (37 C)  99.4 F (37.4 C)  TempSrc:  Oral  Oral  SpO2: 100%  100% 95%   Physical Exam Vitals and nursing note reviewed.  Constitutional:      Comments: Chronically ill-appearing  HENT:     Head: Normocephalic.     Mouth/Throat:     Mouth: Mucous membranes are moist.  Eyes:     Pupils: Pupils are equal, round, and reactive to light.  Cardiovascular:     Rate and Rhythm: Tachycardia present. Rhythm irregular.  Pulmonary:     Effort: Tachypnea present.     Breath sounds: Examination of the right-lower field reveals rhonchi and rales. Examination of the left-lower field reveals rhonchi and rales. Rhonchi and rales present.  Abdominal:     General: Bowel sounds are normal.     Palpations: Abdomen is soft.  Musculoskeletal:        General: Normal range of motion.     Cervical back: Normal range of motion and neck supple.  Skin:    General: Skin is warm and dry.  Neurological:     Mental Status: She is alert.     Motor: Weakness present.  Psychiatric:        Mood and Affect: Mood normal.        Behavior: Behavior normal.     Data Reviewed: Relevant notes from primary care and specialist visits, past discharge summaries as available in EHR, including Care Everywhere. Prior diagnostic testing as pertinent to current admission diagnoses Updated medications and problem lists for reconciliation ED course, including vitals, labs, imaging, treatment and response to treatment Triage notes, nursing and pharmacy notes and ED provider's notes Notable results as  noted in HPI Labs reviewed.  BNP 746: Sodium 132, potassium 3.4, chloride 90, bicarb 30, glucose 109, BUN 16, creatinine 0.72, calcium 9.2, total protein 7.3, albumin 2.9, AST 83, ALT 77, troponin 35 >> 45, TSH 0.70, magnesium 2.1, white count 14.0, hemoglobin 9.3, hematocrit 28.7, platelet count 318  There are no new results to review at this time.  Assessment and Plan: * Acute on chronic hypoxic respiratory failure (HCC) Patient with a history of chronic hypoxic respiratory failure on 3 L of oxygen continuous secondary to COPD who presents to the ER for evaluation of worsening shortness of breath from her baseline and on her 3 L was hypoxic with pulse oximetry of 79%. She was noted to be tachypneic and currently requires 5 L of oxygen to maintain pulse oximetry greater than 92%. Worsening respiratory failure appears to be multifactorial and related to community-acquired pneumonia and acute diastolic dysfunction CHF. Will attempt to wean patient down to her baseline home oxygen once acute illness improves or resolves.  Sepsis due to pneumonia [facility]) Patient presents for evaluation of worsening shortness of  breath associated with a cough productive of thick yellow phlegm and intermittent fever and chills at home. On admission she was noted to be tachypneic, tachycardic, has a white count of 14,000 and chest x-ray findings suggestive of multifocal pneumonia. Lactic acid is pending Patient did not receive IV fluid resuscitation due to findings suggestive of acute CHF We will treat patient empirically with Rocephin and Zithromax Obtain blood and sputum cultures  Acute on chronic diastolic CHF (congestive heart failure) ([place]) Patient with a history of worsening shortness of breath compared to her baseline. Imaging shows findings suggestive of pulmonary edema with bilateral pleural effusions Last known LVEF of 55 to 60% from a 2D echocardiogram which was done 11/23 which also shows evidence of grade  2 diastolic dysfunction Acute CHF exacerbation appears to be secondary to rapid atrial fibrillation Placed send on Lasix 40 mg IV daily Start metoprolol for rate control  Hypokalemia Supplement potassium  Atrial fibrillation with RVR ([place]) Patient with a known history of atrial fibrillation We will place patient on metoprolol 50 mg daily Start patient on Lovenox 1 mg/kg body weight subcu every 12      Advance Care Planning:   Code Status: DNR   Consults: Pulmonary  Family Communication: Greater than 50% of time was spent discussing patient's condition and plan of care with patient and her son at the bedside.  All questions and concerns have been addressed.  They verbalized understanding and agree with the plan.  CODE STATUS was discussed and she wishes to be DNR DO NOT RESUSCITATE.  She lists her children as her healthcare power of attorney.  Severity of Illness: The appropriate patient status for this patient is INPATIENT. Inpatient status is judged to be reasonable and necessary in order to provide the required intensity of service to ensure the patient's safety. The patient's presenting symptoms, physical exam findings, and initial radiographic and laboratory data in the context of their chronic comorbidities is felt to place them at high risk for further clinical deterioration. Furthermore, it is not anticipated that the patient will be medically stable for discharge from the hospital within 2 midnights of admission.   * I certify that at the point of admission it is my clinical judgment that the patient will require inpatient hospital care spanning beyond 2 midnights from the point of admission due to high intensity of service, high risk for further deterioration and high frequency of surveillance required.*  Author: Collier Bullock, MD 04/18/2022 8:59 PM  For on call review [email or web address].

## 2022-04-18 NOTE — Progress Notes (Signed)
The patient is admitted to 2 A from ED. A & O x 4. Denies any acute pain. The patient is oriented to staff, ascom/call bell. Full assessment to epic completed. Ill continue to monitor.

## 2022-04-18 NOTE — Assessment & Plan Note (Signed)
Patient with a history of chronic hypoxic respiratory failure on 3 L of oxygen continuous secondary to COPD who presents to the ER for evaluation of worsening shortness of breath from her baseline and on her 3 L was hypoxic with pulse oximetry of 79%. She was noted to be tachypneic and currently requires 5 L of oxygen to maintain pulse oximetry greater than 92%. Worsening respiratory failure appears to be multifactorial and related to community-acquired pneumonia and acute diastolic dysfunction CHF. Will attempt to wean patient down to her baseline home oxygen once acute illness improves or resolves.

## 2022-04-18 NOTE — ED Provider Notes (Signed)
[facility] Provider Note    Event Date/Time   First MD Initiated Contact with Patient 04/18/22 1502     (approximate)   History   Shortness of Breath   HPI  Kristi Clark is a 72 y.o. female with history of A-fib on plavix, COPD on 3 L who comes in with worsening shortness of breath.  Patient reports not feeling well for the past 4 days.  She reports that last night it began worsening around 3 AM with increasing shortness of breath.  When EMS got there she was in the 70s on her baseline 3 L patient given a breathing treatment and her oxygen level seem to gotten better.  She denies any new swelling in her legs no calf tenderness.  Denies any abdominal pain.  She does report some dysuria and feeling like she might have a UTI.   Physical Exam   Triage Vital Signs: ED Triage Vitals  Enc Vitals Group     BP 04/18/22 1454 (!) 135/38     Pulse Rate 04/18/22 1454 (!) 108     Resp 04/18/22 1454 20     Temp 04/18/22 1451 98.4 F (36.9 C)     Temp Source 04/18/22 1451 Oral     SpO2 04/18/22 1452 99 %     Weight --      Height --      Head Circumference --      Peak Flow --      Pain Score 04/18/22 1452 0     Pain Loc --      Pain Edu? --      Excl. in [place]? --     Most recent vital signs: Vitals:   04/18/22 1452 04/18/22 1454  BP:  (!) 135/38  Pulse:  (!) 108  Resp:  20  Temp:  98.4 F (36.9 C)  SpO2: 99% 100%     General: Awake, no distress.  CV:  Good peripheral perfusion.  Irregular Resp:  Normal effort.  Mild wheeze Abd:  No distention.  Soft and nontender Other:  No swelling in legs.  No calf tenderness   ED Results / Procedures / Treatments   Labs (all labs ordered are listed, but only abnormal results are displayed) Labs Reviewed  RESP PANEL BY RT-PCR (RSV, FLU A&B, COVID)  RVPGX2  COMPREHENSIVE METABOLIC PANEL  CBC  BRAIN NATRIURETIC PEPTIDE  TSH  T4, FREE  MAGNESIUM  URINALYSIS, W/ REFLEX TO CULTURE (INFECTION SUSPECTED)   TROPONIN I (HIGH SENSITIVITY)     EKG  My interpretation of EKG:  Atrial fibrillation rate of 107 without any ST elevation or T wave inversions, normal intervals  RADIOLOGY I have reviewed the xray personally and interpreted and patient has some increased densities on the right lung.  PROCEDURES:  Critical Care performed: Yes, see critical care procedure note(s)  Ultrasound ED Peripheral IV (Provider)  Date/Time: 04/18/2022 4:14 PM  Performed by: Vanessa [place], MD Authorized by: Vanessa [place], MD   Procedure details:    Indications: multiple failed IV attempts and poor IV access     Skin Prep: chlorhexidine gluconate     Location:  Right forearm   Angiocath:  20 G   Bedside Ultrasound Guided: Yes     Images: not archived     Patient tolerated procedure without complications: Yes     Dressing applied: Yes   .Critical Care  Performed by: Vanessa [place], MD Authorized by: Vanessa [place], MD  Critical care provider statement:    Critical care time (minutes):  30   Critical care was necessary to treat or prevent imminent or life-threatening deterioration of the following conditions:  Respiratory failure   Critical care was time spent personally by me on the following activities:  Development of treatment plan with patient or surrogate, discussions with consultants, evaluation of patient's response to treatment, examination of patient, ordering and review of laboratory studies, ordering and review of radiographic studies, ordering and performing treatments and interventions, pulse oximetry, re-evaluation of patient's condition and review of old charts .1-3 Lead EKG Interpretation  Performed by: Vanessa [place], MD Authorized by: Vanessa [place], MD     Interpretation: abnormal     ECG rate:  110   ECG rate assessment: tachycardic     Rhythm: atrial fibrillation     Ectopy: none     Conduction: normal      MEDICATIONS ORDERED IN ED: Medications  iohexol (OMNIPAQUE) 350  MG/ML injection 100 mL (75 mLs Intravenous Contrast Given 04/18/22 1741)  ipratropium-albuterol (DUONEB) 0.5-2.5 (3) MG/3ML nebulizer solution 3 mL (3 mLs Nebulization Given 04/18/22 1845)  ipratropium-albuterol (DUONEB) 0.5-2.5 (3) MG/3ML nebulizer solution 3 mL (3 mLs Nebulization Given 04/18/22 1845)  methylPREDNISolone sodium succinate (SOLU-MEDROL) 125 mg/2 mL injection 125 mg (125 mg Intravenous Given 04/18/22 1845)  furosemide (LASIX) injection 40 mg (40 mg Intravenous Given 04/18/22 1845)     IMPRESSION / MDM / ASSESSMENT AND PLAN / ED COURSE  I reviewed the triage vital signs and the nursing notes.   Patient's presentation is most consistent with severe exacerbation of chronic illness.   Patient comes in with worsening shortness of breath in the setting of COPD on 3 L.  Does not appear to be consistent with CHF given her leg exam but will get a BNP differential includes pneumonia, COPD.  Trop slightly elevated but downtrending on repeat will hold off on any heparin, COVID, flu are negative.  Labs show slightly low chloride and sodium.  Her hemoglobin is 9.3 reviewed prior records typically runs 9-10.  BNP slightly elevated x-ray recommend a CT scan.  She does report some mild abdominal discomfort and difficulty getting her urine out so I did get a CT chest and CT abdomen.  CT imaging shows concerns for severe emphysema with some plugging also concern for pulmonary edema we will give some IV Lasix, DuoNebs and steroids.  Patient had an episode of mucous plugging most likely and desatted down to the 70s and turned up to 5 L.  Discussed with patient about admission and she expressed understanding.  Patient still unable to urinate we will have them do a bladder scan and if still retaining will place Foley.  Will admit to the hospital team  The patient is on the cardiac monitor to evaluate for evidence of arrhythmia and/or significant heart rate changes.      FINAL CLINICAL IMPRESSION(S) / ED  DIAGNOSES   Final diagnoses:  Acute on chronic respiratory failure with hypoxia (HCC)  Atrial fibrillation, unspecified type ([place])  Pulmonary emphysema, unspecified emphysema type ([place])     Rx / DC Orders   ED Discharge Orders     None        Note:  This document was prepared using Dragon voice recognition software and may include unintentional dictation errors.   Vanessa [place], MD 04/18/22 [phone]

## 2022-04-18 NOTE — ED Triage Notes (Signed)
Pt to ED from home via EMS for shortness of breath related to pneumonia and hx of COPD. Pt is on 3 L [place] at baseline and was satting in the 70s. Pt received an albuterol treatment from EMS. Pt is now satting at 98% on her baseline oxygen.

## 2022-04-18 NOTE — Consult Note (Signed)
ANTICOAGULATION CONSULT NOTE - Initial Consult  Pharmacy Consult for enoxaparin subQ Indication: atrial fibrillation  Not on File  Patient Measurements: Height: 4' 11"$  (149.9 cm) Weight: 39.3 kg (86 lb 10.3 oz) IBW/kg (Calculated) : 43.2 Heparin Dosing Weight: 39.3 kg  Vital Signs: Temp: 99.4 F (37.4 C) (02/12 2050) Temp Source: Oral (02/12 2050) BP: 145/58 (02/12 2050) Pulse Rate: 105 (02/12 2050)  Labs: Recent Labs    04/18/22 1504 04/18/22 1615  HGB 9.3*  --   HCT 28.7*  --   PLT 318  --   CREATININE 0.72  --   TROPONINIHS 45* 35*    Estimated Creatinine Clearance: 38.9 mL/min (by C-G formula based on SCr of 0.72 mg/dL).   Medical History: Past Medical History:  Diagnosis Date   COPD (chronic obstructive pulmonary disease) (HCC)    Coronary artery disease    Depression    Dysrhythmia     Medications:  Scheduled:   aspirin EC  81 mg Oral Daily   [START ON 04/19/2022] furosemide  40 mg Intravenous Daily   metoprolol succinate  50 mg Oral Daily   mometasone-formoterol  2 puff Inhalation BID   potassium chloride  40 mEq Oral Daily   [START ON 04/19/2022] predniSONE  40 mg Oral Q breakfast   sodium chloride flush  3 mL Intravenous Q12H    Assessment: 72 year old female with Afib. Pharmacy consulted for lovenox afib dosing.  Goal of Therapy:  Monitor platelets by anticoagulation protocol: Yes   Plan:  Lovenox 32m subQ twice daily  ADarrick Penna2/02/2023,9:23 PM

## 2022-04-18 NOTE — Telephone Encounter (Signed)
  Chief Complaint: Difficulty breathing Symptoms: O2 sat of 88% with 3L of O2 HR of 115-130 Frequency: past 24 hours Pertinent Negatives: Patient denies  Disposition: '[x]'$ ED /'[]'$ Urgent Care (no appt availability in office) / '[]'$ Appointment(In office/virtual)/ '[]'$  [place] [facility]/ '[]'$ Home Care/ '[]'$ Refused Recommended Disposition /'[]'$ [place] [facility]/ '[]'$  Follow-up with PCP Additional Notes: Received call from [place]( OT at [facility]). He states that pt is having difficulty breathing. Vitals above.   Richard will call EMS for pt transport.    Reason for Disposition  Oxygen level (e.g., pulse oximetry) 90 percent or lower  [1] MODERATE difficulty breathing (e.g., speaks in phrases, SOB even at rest, pulse 100-120) AND [2] NEW-onset or WORSE than normal  Answer Assessment - Initial Assessment Questions 1. RESPIRATORY STATUS: "Describe your breathing?" (e.g., wheezing, shortness of breath, unable to speak, severe coughing)      Short of breath 2. ONSET: "When did this breathing problem begin?"      24 hours 3. PATTERN "Does the difficult breathing come and go, or has it been constant since it started?"      Constant 4. SEVERITY: "How bad is your breathing?" (e.g., mild, moderate, severe)    - MILD: No SOB at rest, mild SOB with walking, speaks normally in sentences, can lie down, no retractions, pulse < 100.    - MODERATE: SOB at rest, SOB with minimal exertion and prefers to sit, cannot lie down flat, speaks in phrases, mild retractions, audible wheezing, pulse 100-120.    - SEVERE: Very SOB at rest, speaks in single words, struggling to breathe, sitting hunched forward, retractions, pulse > 120     Moderate to severe 5. RECURRENT SYMPTOM: "Have you had difficulty breathing before?" If Yes, ask: "When was the last time?" and "What happened that time?"       6. CARDIAC HISTORY: "Do you have any history of heart disease?" (e.g., heart attack, angina, bypass surgery, angioplasty)        7. LUNG HISTORY: "Do you have any history of lung disease?"  (e.g., pulmonary embolus, asthma, emphysema)     COPD 8. CAUSE: "What do you think is causing the breathing problem?"       9. OTHER SYMPTOMS: "Do you have any other symptoms? (e.g., dizziness, runny nose, cough, chest pain, fever)     Lack of appetite 10. O2 SATURATION MONITOR:  "Do you use an oxygen saturation monitor (pulse oximeter) at home?" If Yes, ask: "What is your reading (oxygen level) today?" "What is your usual oxygen saturation reading?" (e.g., 95%)       88 11. PREGNANCY: "Is there any chance you are pregnant?" "When was your last menstrual period?"        12. TRAVEL: "Have you traveled out of the country in the last month?" (e.g., travel history, exposures)  Protocols used: Breathing Difficulty-A-AH

## 2022-04-19 DIAGNOSIS — J189 Pneumonia, unspecified organism: Secondary | ICD-10-CM

## 2022-04-19 DIAGNOSIS — I5033 Acute on chronic diastolic (congestive) heart failure: Secondary | ICD-10-CM | POA: Diagnosis not present

## 2022-04-19 DIAGNOSIS — A419 Sepsis, unspecified organism: Secondary | ICD-10-CM

## 2022-04-19 DIAGNOSIS — I4891 Unspecified atrial fibrillation: Secondary | ICD-10-CM | POA: Diagnosis not present

## 2022-04-19 DIAGNOSIS — J9621 Acute and chronic respiratory failure with hypoxia: Secondary | ICD-10-CM | POA: Diagnosis not present

## 2022-04-19 LAB — BASIC METABOLIC PANEL
Anion gap: 12 (ref 5–15)
BUN: 17 mg/dL (ref 8–23)
CO2: 30 mmol/L (ref 22–32)
Calcium: 8.7 mg/dL — ABNORMAL LOW (ref 8.9–10.3)
Chloride: 92 mmol/L — ABNORMAL LOW (ref 98–111)
Creatinine, Ser: 0.74 mg/dL (ref 0.44–1.00)
GFR, Estimated: >60 mL/min (ref >60)
Glucose, Bld: 112 mg/dL — ABNORMAL HIGH (ref 70–99)
Potassium: 4.2 mmol/L (ref 3.5–5.1)
Sodium: 134 mmol/L — ABNORMAL LOW (ref 135–145)

## 2022-04-19 LAB — CBC
HCT: 25.1 % — ABNORMAL LOW (ref 36.0–46.0)
Hemoglobin: 8.3 g/dL — ABNORMAL LOW (ref 12.0–15.0)
MCH: 30.6 pg (ref 26.0–34.0)
MCHC: 33.1 g/dL (ref 30.0–36.0)
MCV: 92.6 fL (ref 80.0–100.0)
Platelets: 317 10*3/uL (ref 150–400)
RBC: 2.71 MIL/uL — ABNORMAL LOW (ref 3.87–5.11)
RDW: 16 % — ABNORMAL HIGH (ref 11.5–15.5)
WBC: 11.1 10*3/uL — ABNORMAL HIGH (ref 4.0–10.5)
nRBC: 0 % (ref 0.0–0.2)

## 2022-04-19 LAB — PROCALCITONIN: Procalcitonin: 0.25 ng/mL

## 2022-04-19 MED ORDER — ACETYLCYSTEINE 20 % IN SOLN
4.0000 mL | Freq: Three times a day (TID) | RESPIRATORY_TRACT | Status: DC
  Administered 2022-04-19 – 2022-04-20 (×6): 4 mL via RESPIRATORY_TRACT
  Filled 2022-04-19 (×9): qty 4

## 2022-04-19 MED ORDER — AZITHROMYCIN 250 MG PO TABS
250.0000 mg | ORAL_TABLET | Freq: Every day | ORAL | Status: DC
  Administered 2022-04-19 – 2022-04-20 (×3): 250 mg via ORAL
  Filled 2022-04-19 (×3): qty 1

## 2022-04-19 MED ORDER — QUETIAPINE FUMARATE ER 50 MG PO TB24
100.0000 mg | ORAL_TABLET | Freq: Every day | ORAL | Status: DC
  Administered 2022-04-19 – 2022-04-20 (×2): 100 mg via ORAL
  Filled 2022-04-19 (×2): qty 2

## 2022-04-19 MED ORDER — POLYETHYLENE GLYCOL 3350 17 G PO PACK: 17.0000 g | PACK | Freq: Every day | ORAL | Status: DC | PRN

## 2022-04-19 MED ORDER — ISOSORBIDE MONONITRATE ER 60 MG PO TB24
60.0000 mg | ORAL_TABLET | Freq: Every day | ORAL | Status: DC
  Administered 2022-04-19 – 2022-04-21 (×3): 60 mg via ORAL
  Filled 2022-04-19 (×3): qty 1

## 2022-04-19 MED ORDER — ATORVASTATIN CALCIUM 20 MG PO TABS
40.0000 mg | ORAL_TABLET | Freq: Every day | ORAL | Status: DC
  Administered 2022-04-19 – 2022-04-21 (×3): 40 mg via ORAL
  Filled 2022-04-19 (×3): qty 2

## 2022-04-19 MED ORDER — FERROUS SULFATE 325 (65 FE) MG PO TABS
325.0000 mg | ORAL_TABLET | Freq: Three times a day (TID) | ORAL | Status: DC
  Administered 2022-04-19 – 2022-04-21 (×7): 325 mg via ORAL
  Filled 2022-04-19 (×6): qty 1

## 2022-04-19 MED ORDER — HYDRALAZINE HCL 25 MG PO TABS
12.5000 mg | ORAL_TABLET | Freq: Two times a day (BID) | ORAL | Status: DC
  Administered 2022-04-19 – 2022-04-21 (×5): 12.5 mg via ORAL
  Filled 2022-04-19 (×5): qty 1

## 2022-04-19 MED ORDER — CLOPIDOGREL BISULFATE 75 MG PO TABS
75.0000 mg | ORAL_TABLET | Freq: Every day | ORAL | Status: DC
  Administered 2022-04-19 – 2022-04-21 (×3): 75 mg via ORAL
  Filled 2022-04-19 (×3): qty 1

## 2022-04-19 MED ORDER — ADULT MULTIVITAMIN W/MINERALS CH
1.0000 | ORAL_TABLET | Freq: Every day | ORAL | Status: DC
  Administered 2022-04-19 – 2022-04-21 (×3): 1 via ORAL
  Filled 2022-04-19 (×3): qty 1

## 2022-04-19 MED ORDER — ENSURE ENLIVE PO LIQD
237.0000 mL | Freq: Two times a day (BID) | ORAL | Status: DC
  Administered 2022-04-19 – 2022-04-21 (×5): 237 mL via ORAL

## 2022-04-19 MED ORDER — PANTOPRAZOLE SODIUM 40 MG PO TBEC
40.0000 mg | DELAYED_RELEASE_TABLET | Freq: Two times a day (BID) | ORAL | Status: DC
  Administered 2022-04-19 – 2022-04-21 (×4): 40 mg via ORAL
  Filled 2022-04-19 (×4): qty 1

## 2022-04-19 MED ORDER — LOSARTAN POTASSIUM 25 MG PO TABS
25.0000 mg | ORAL_TABLET | Freq: Every day | ORAL | Status: DC
  Administered 2022-04-19 – 2022-04-21 (×3): 25 mg via ORAL
  Filled 2022-04-19 (×3): qty 1

## 2022-04-19 MED ORDER — METOPROLOL SUCCINATE ER 50 MG PO TB24: 50.0000 mg | ORAL_TABLET | Freq: Every day | ORAL | Status: DC

## 2022-04-19 MED ORDER — ASPIRIN 81 MG PO TBEC: 81.0000 mg | DELAYED_RELEASE_TABLET | Freq: Every day | ORAL | Status: DC

## 2022-04-19 MED ORDER — MIRTAZAPINE 15 MG PO TBDP
45.0000 mg | ORAL_TABLET | Freq: Every day | ORAL | Status: DC
  Administered 2022-04-19 – 2022-04-20 (×2): 45 mg via ORAL
  Filled 2022-04-19 (×2): qty 3

## 2022-04-19 NOTE — Progress Notes (Signed)
RT notified as well and he came to the bedside.  Patient is back on  at 3 L and her sat is sustaining in the 97%. She's A & O x x 4. No new oprder received form [place] NP. Will continue to monitor.

## 2022-04-19 NOTE — Progress Notes (Signed)
Initial Nutrition Assessment  DOCUMENTATION CODES:   Non-severe (moderate) malnutrition in context of chronic illness, Underweight  INTERVENTION:   -Liberalize diet to 2 gram sodium for wider variety of meal selections -MVI with minerals daily -Ensure Enlive po BID, each supplement provides 350 kcal and 20 grams of protein.    NUTRITION DIAGNOSIS:   Moderate Malnutrition related to chronic illness (COPD) as evidenced by mild fat depletion, moderate fat depletion, moderate muscle depletion, mild muscle depletion.  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  Pt admitted with respiratory failure secondary to COPD exacerbation and CHF.   Reviewed I/O's: +607 ml x 24 hours  UOP: 1.2 L x 24 hours   Per MD notes, chest x-rays concerning for multifocal pneumonia.  Spoke with pt and daughter at bedside. Pt reports fair appetite and often eats small amounts at baseline. Pt consumes 2-3 meals per day (Breakfast: eggs and toast; Lunch: sandwich; Dinner: meat, starch, and vegetable). Pt also endorses consuming 1-2 Ensure supplements daily. Pt is eager to eat her lunch of grilled cheese and tomato soup.   Pt reports her weight fluctuates at baseline secondary to fluid retention. Per pt, UBW is around 89-99#. No wt history available to assess at this time.  Discussed importance of good meal and supplement intake to promote healing. Pt amenable to supplements.   Medications reviewed and include ferrous sulfate, lasix, prednisone, and  remeron.   Labs reviewed: Na: 134.    NUTRITION - FOCUSED PHYSICAL EXAM:  Flowsheet Row Most Recent Value  Orbital Region Mild depletion  Upper Arm Region Moderate depletion  Thoracic and Lumbar Region Mild depletion  Buccal Region Mild depletion  Temple Region Mild depletion  Clavicle Bone Region Moderate depletion  Clavicle and Acromion Bone Region Moderate depletion  Scapular Bone Region Moderate depletion  Dorsal Hand Moderate depletion  Patellar Region Moderate depletion  Anterior Thigh Region Moderate depletion  Posterior Calf Region Moderate depletion  Edema (RD Assessment) None  Hair Reviewed  Eyes Reviewed  Mouth Reviewed  Skin Reviewed  Nails Reviewed       Diet Order:   Diet Order             Diet 2 gram sodium Room service appropriate? Yes; Fluid consistency: Thin  Diet effective now                   EDUCATION NEEDS:   Education needs have been addressed  Skin:  Skin Assessment: Reviewed RN Assessment  Last BM:  Unknown  Height:   Ht Readings from Last 1 Encounters:  04/18/22 4' 11"$  (1.499 m)    Weight:   Wt Readings from Last 1 Encounters:  04/19/22 39.9 kg    Ideal Body Weight:  44.7 kg  BMI:  Body mass index is 17.77 kg/m.  Estimated Nutritional Needs:   Kcal:  1600-1800  Protein:  80-95 grams  Fluid:  > 1.6 L    Loistine Chance, RD, LDN, [place] Registered Dietitian II Certified Diabetes Care and Education Specialist Please refer to [facility] for RD and/or RD on-call/weekend/after hours  pager

## 2022-04-19 NOTE — Discharge Instructions (Signed)

## 2022-04-19 NOTE — Plan of Care (Signed)
Problem: Education: Goal: Ability to demonstrate management of disease process will improve Outcome: Progressing Goal: Ability to verbalize understanding of medication therapies will improve Outcome: Progressing Goal: Individualized Educational Video(s) Outcome: Progressing   Problem: Activity: Goal: Capacity to carry out activities will improve Outcome: Progressing   Problem: Cardiac: Goal: Ability to achieve and maintain adequate cardiopulmonary perfusion will improve Outcome: Progressing   Problem: Education: Goal: Knowledge of disease or condition will improve Outcome: Progressing Goal: Knowledge of the prescribed therapeutic regimen will improve Outcome: Progressing Goal: Individualized Educational Video(s) Outcome: Progressing   Problem: Activity: Goal: Ability to tolerate increased activity will improve Outcome: Progressing Goal: Will verbalize the importance of balancing activity with adequate rest periods Outcome: Progressing   Problem: Respiratory: Goal: Ability to maintain a clear airway will improve Outcome: Progressing Goal: Levels of oxygenation will improve Outcome: Progressing Goal: Ability to maintain adequate ventilation will improve Outcome: Progressing   Problem: Activity: Goal: Ability to tolerate increased activity will improve Outcome: Progressing   Problem: Clinical Measurements: Goal: Ability to maintain a body temperature in the normal range will improve Outcome: Progressing   Problem: Respiratory: Goal: Ability to maintain adequate ventilation will improve Outcome: Progressing Goal: Ability to maintain a clear airway will improve Outcome: Progressing   Problem: Education: Goal: Knowledge of General Education information will improve Description: Including pain rating scale, medication(s)/side effects and non-pharmacologic comfort measures Outcome: Progressing   Problem: Health Behavior/Discharge Planning: Goal: Ability to manage  health-related needs will improve Outcome: Progressing   Problem: Clinical Measurements: Goal: Ability to maintain clinical measurements within normal limits will improve Outcome: Progressing Goal: Will remain free from infection Outcome: Progressing Goal: Diagnostic test results will improve Outcome: Progressing Goal: Respiratory complications will improve Outcome: Progressing Goal: Cardiovascular complication will be avoided Outcome: Progressing   Problem: Activity: Goal: Risk for activity intolerance will decrease Outcome: Progressing   Problem: Nutrition: Goal: Adequate nutrition will be maintained Outcome: Progressing   Problem: Coping: Goal: Level of anxiety will decrease Outcome: Progressing   Problem: Elimination: Goal: Will not experience complications related to bowel motility Outcome: Progressing Goal: Will not experience complications related to urinary retention Outcome: Progressing   Problem: Pain Managment: Goal: General experience of comfort will improve Outcome: Progressing   Problem: Safety: Goal: Ability to remain free from injury will improve Outcome: Progressing   Problem: Skin Integrity: Goal: Risk for impaired skin integrity will decrease Outcome: Progressing   Problem: Education: Goal: Knowledge of General Education information will improve Description: Including pain rating scale, medication(s)/side effects and non-pharmacologic comfort measures Outcome: Progressing   Problem: Health Behavior/Discharge Planning: Goal: Ability to manage health-related needs will improve Outcome: Progressing   Problem: Clinical Measurements: Goal: Ability to maintain clinical measurements within normal limits will improve Outcome: Progressing Goal: Will remain free from infection Outcome: Progressing Goal: Diagnostic test results will improve Outcome: Progressing Goal: Respiratory complications will improve Outcome: Progressing Goal: Cardiovascular  complication will be avoided Outcome: Progressing   Problem: Activity: Goal: Risk for activity intolerance will decrease Outcome: Progressing   Problem: Nutrition: Goal: Adequate nutrition will be maintained Outcome: Progressing   Problem: Coping: Goal: Level of anxiety will decrease Outcome: Progressing   Problem: Education: Goal: Ability to demonstrate management of disease process will improve Outcome: Progressing Goal: Ability to verbalize understanding of medication therapies will improve Outcome: Progressing Goal: Individualized Educational Video(s) Outcome: Progressing   Problem: Activity: Goal: Capacity to carry out activities will improve Outcome: Progressing   Problem: Cardiac: Goal: Ability to achieve and maintain adequate cardiopulmonary  perfusion will improve Outcome: Progressing

## 2022-04-19 NOTE — Progress Notes (Signed)
[facility] NAME: Kristi Clark    MR#:  [id number]  DATE OF BIRTH:  1950/09/26  SUBJECTIVE:  patient's daughter at bedside. Seen earlier. Came in with increasing shortness of breath cough and congestion. No fever. Poor cough reflux. Getting very tired/fatigued. Lives with daughter at home. Where's 3 L nasal cannula oxygen chronically for severe emphysema dropped her saturations down at home into the 80s. Found to be in congestive heart failure and bronchitis. Getting antibiotics and IV Lasix.   VITALS:  Blood pressure (!) 142/46, pulse 77, temperature 97.7 F (36.5 C), temperature source Oral, resp. rate 18, height 4' 11"$  (1.499 m), weight 39.9 kg, SpO2 95 %.  PHYSICAL EXAMINATION:   GENERAL:  72 y.o.-year-old patient with no acute distress.  LUNGS: Normal breath sounds bilaterally, fine bibasilar rales CARDIOVASCULAR: S1, S2 normal. No murmur   ABDOMEN: Soft, nontender, nondistended. Bowel sounds present.  EXTREMITIES: ++ edema b/l.    NEUROLOGIC: nonfocal  patient is alert and awake SKIN:per RN  LABORATORY PANEL:  CBC Recent Labs  Lab 04/19/22 0422  WBC 11.1*  HGB 8.3*  HCT 25.1*  PLT 317    Chemistries  Recent Labs  Lab 04/18/22 1504 04/18/22 1511 04/19/22 0422  NA 132*  --  134*  K 3.4*  --  4.2  CL 90*  --  92*  CO2 30  --  30  GLUCOSE 109*  --  112*  BUN 16  --  17  CREATININE 0.72  --  0.74  CALCIUM 9.2  --  8.7*  MG  --  2.1  --   AST 83*  --   --   ALT 77*  --   --   ALKPHOS 113  --   --   BILITOT 0.7  --   --    Cardiac Enzymes No results for input(s): "TROPONINI" in the last 168 hours. RADIOLOGY:  CT Angio Chest PE W and/or Wo Contrast  Result Date: 04/18/2022 CLINICAL DATA:  PE suspected, shortness of breath EXAM: CT ANGIOGRAPHY CHEST WITH CONTRAST TECHNIQUE: Multidetector CT imaging of the chest was performed using the standard protocol during bolus administration of intravenous contrast.  Multiplanar CT image reconstructions and MIPs were obtained to evaluate the vascular anatomy. RADIATION DOSE REDUCTION: This exam was performed according to the departmental dose-optimization program which includes automated exposure control, adjustment of the mA and/or kV according to patient size and/or use of iterative reconstruction technique. CONTRAST:  72m OMNIPAQUE IOHEXOL 350 MG/ML SOLN COMPARISON:  01/14/2022 FINDINGS: Cardiovascular: Satisfactory opacification of the pulmonary arteries to the segmental level. No evidence of pulmonary embolism. Cardiomegaly. Three-vessel coronary artery calcifications. No pericardial effusion. Aortic atherosclerosis. Mediastinum/Nodes: Numerous enlarged mediastinal and hilar lymph nodes, not significantly changed compared to prior examination. Subcarinal lymph node measures up to 2.2 x 2.0 cm (series 5, image 58). Thyroid gland, trachea, and esophagus demonstrate no significant findings. Lungs/Pleura: Severe emphysema. Diffuse bilateral bronchial wall thickening and extensive bronchiolar plugging in the lower lobes. Interlobular septal thickening throughout the lungs with small bilateral pleural effusions. Upper Abdomen: Please see separately reported examination of the abdomen and pelvis. Musculoskeletal: No chest wall abnormality. No acute osseous findings. Review of the MIP images confirms the above findings. IMPRESSION: 1. Negative examination for pulmonary embolism. 2. Severe emphysema. 3. Diffuse bilateral bronchial wall thickening and extensive bronchiolar plugging in the lower lobes. 4. Interlobular septal thickening throughout the lungs with small bilateral pleural effusions, consistent with pulmonary  edema. 5. Numerous enlarged mediastinal and hilar lymph nodes, not significantly changed compared to prior examination and likely reactive. 6. Cardiomegaly and coronary artery disease. Aortic Atherosclerosis (ICD10-I70.0) and Emphysema (ICD10-J43.9). Electronically  Signed   By: Delanna Ahmadi M.D.   On: 04/18/2022 18:19   CT ABDOMEN PELVIS W CONTRAST  Result Date: 04/18/2022 CLINICAL DATA:  Abdominal pain, shortness of breath EXAM: CT ABDOMEN AND PELVIS WITH CONTRAST TECHNIQUE: Multidetector CT imaging of the abdomen and pelvis was performed using the standard protocol following bolus administration of intravenous contrast. RADIATION DOSE REDUCTION: This exam was performed according to the departmental dose-optimization program which includes automated exposure control, adjustment of the mA and/or kV according to patient size and/or use of iterative reconstruction technique. CONTRAST:  35m OMNIPAQUE IOHEXOL 350 MG/ML SOLN COMPARISON:  01/14/2022 FINDINGS: Lower chest: Please see separately reported examination of the chest. Hepatobiliary: No focal liver abnormality is seen. Status post cholecystectomy. No biliary dilatation. Pancreas: Unremarkable. No pancreatic ductal dilatation or surrounding inflammatory changes. Spleen: Normal in size without significant abnormality. Adrenals/Urinary Tract: Adrenal glands are unremarkable. Renal vascular calcifications without evidence of calculi. Kidneys are otherwise normal, without renal calculi, solid lesion, or hydronephrosis. Bladder is unremarkable. Stomach/Bowel: Stomach is within normal limits. Appendix appears normal. No evidence of bowel wall thickening, distention, or inflammatory changes. Vascular/Lymphatic: Aortic atherosclerosis. No enlarged abdominal or pelvic lymph nodes. Reproductive: Status post hysterectomy. Other: No abdominal wall hernia.  Anasarca.  No ascites. Musculoskeletal: No acute or significant osseous findings. IMPRESSION: 1. No acute CT findings of the abdomen or pelvis to explain abdominal pain. 2. Anasarca. 3. Status post cholecystectomy and hysterectomy. Aortic Atherosclerosis (ICD10-I70.0). Electronically Signed   By: ADelanna AhmadiM.D.   On: 04/18/2022 18:11   DG Chest Port 1 View  Result Date:  04/18/2022 CLINICAL DATA:  Shortness of breath. EXAM: PORTABLE CHEST 1 VIEW COMPARISON:  None Available. FINDINGS: AP portable view of the chest demonstrates diffuse interstitial lung markings with patchy parenchymal lung densities particularly in the right lung. Cannot exclude pulmonary nodules in the right mid lung region. Heart size is normal. Atherosclerotic calcifications at the aortic arch. Blunting at the costophrenic angles are suspicious for pleural effusions. Negative for a pneumothorax. Probable scarring at the lung apices. IMPRESSION: 1. Patchy parenchymal lung densities particularly in the right lung. Findings are concerning for multifocal pneumonia. 2. Cannot exclude pulmonary nodules in the right mid lung region. Recommend further characterization with a chest CT with IV contrast. 3. Probable small bilateral pleural effusions. 4. Diffuse interstitial lung markings. Electronically Signed   By: AMarkus DaftM.D.   On: 04/18/2022 15:32    Assessment and Plan PBrinlynn Corbridgeis a 72y.o. 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   Chest x-ray shows  patchy parenchymal lung densities particularly in the right lung. Findings are concerning for multifocal pneumonia  (Findings kind of similar to previous cxr--Chart merge)  CT scan of abdomen and pelvis with contrast shows no acute CT findings of the abdomen or pelvis to explain abdominal pain. Anasarca. Status post cholecystectomy and hysterectomy.  CT angiogram of the chest with contrast was negative examination  for pulmonary  embolism. Severe emphysema. Diffuse bilateral bronchial wall thickening and extensive bronchiolar plugging in the lower lobes. Interlobular septal thickening throughout the lungs with small bilateral pleural effusions, consistent with pulmonary edema. Numerous enlarged mediastinal and hilar lymph nodes, not significantly changed compared to prior examination and likely reactive. Cardiomegaly and coronary artery disease.  Acute on chronic hypoxic respiratory failure secondary to severe emphysema/COPD exacerbation with bronchopneumonia Acute on chronic diastolic congestive heart failure -- patient chronically on 3 L nasal cannula oxygen -- she was noted to be hypoxic at 79% on 3 L. Bumped it up to 5 L will wean down as tolerated. -- Continue broad-spectrum antibiotic -- continue IV Lasix. -- Patient follows pulmonary with Dr. Raul Del consult if needed --  Sepsis due to bronchopneumonia -- noted to be tachypnea tachycardic with elevated white count and abnormal chest x-ray. -- Blood cultures negative -- continue antibiotics as above de-escalate it when appropriate  Hypokalemia -- replace  a fib with RVR in the setting of sepsis and chf (h/o afib) -- patient is back in sinus rhythm -- currently on metoprolol -- patient also takes amiodarone at home. --d/c lovenox (rx dose) if remains in SR by tomorrow (Not on any blood thinners at home)  CAD --stable --cont home meds  Gen weakness/deconditioning --PT/OT to see  Family communication :dter at bedised Consults :none CODE STATUS: DNR/DNI (per dter) DVT Prophylaxis :lovenox Level of care: Progressive Status is: Inpatient Remains inpatient appropriate because: chf, copd, pneumonia    TOTAL TIME TAKING CARE OF THIS PATIENT: 35 minutes.  >50% time spent on counselling and coordination of care  Note: This dictation was prepared with Dragon dictation along with smaller phrase technology. Any transcriptional errors that result from this  process are unintentional.  Fritzi Mandes M.D    [facility]   CC: Primary care physician; [name], DO

## 2022-04-19 NOTE — Progress Notes (Signed)
Patient  just woke up yelling and screening in her room. Staff went to her bedside noted her using her accessory muscle to breath and her 02 sat has dropped to 79 %. NRB applied and she's now 100 %. [place] NP notified

## 2022-04-19 NOTE — Consult Note (Signed)
Pulmonary Medicine          Date: 04/19/2022,   MRN# [id number] Kristi Clark 05/04/1950     AdmissionWeight: 39.3 kg                 CurrentWeight: 39.9 kg      CHIEF COMPLAINT:   Pneumonia/resp failure   HISTORY OF PRESENT ILLNESS   This is a 72 yr old lady, known to have severe copd who is well known to our service. Came in with sob, cough, low sats, chest congestion. Her daughter was in the  room. She mentioned the patient was also c/o malaise, weak and dependent on oxygen. At the time seen here she was sitting on the side of the bed erct able to complete full sentences. No angina, pleurisy or hemoptysis.    On arrival on w/u her wbc ( 11/1) was elevated, hgb 8.3cxr showed pulm edema and ? Pneumonia.  Extended viral panel negative Chest ct findings: MPRESSION: 1. Negative examination for pulmonary embolism. 2. Severe emphysema. 3. Diffuse bilateral bronchial wall thickening and extensive bronchiolar plugging in the lower lobes. 4. Interlobular septal thickening throughout the lungs with small bilateral pleural effusions, consistent with pulmonary edema. 5. Numerous enlarged mediastinal and hilar lymph nodes, not significantly changed compared to prior examination and likely reactive. 6. Cardiomegaly and coronary artery disease.   Aortic Atherosclerosis (ICD10-I70.0) and Emphysema (ICD10-J43.9).    Electronically Signed   By: Delanna Ahmadi M.D.   On: 04/18/2022 18:19   PAST MEDICAL HISTORY   Past Medical History:  Diagnosis Date   COPD (chronic obstructive pulmonary disease) ([place])    Coronary artery disease    Depression    Dysrhythmia      SURGICAL HISTORY   Past Surgical History:  Procedure Laterality Date   CORONARY ANGIOPLASTY       FAMILY HISTORY   History reviewed. No pertinent family history.   SOCIAL HISTORY   Social History   Tobacco Use   Smoking status: Former    Types: Cigarettes  Substance Use Topics   Drug  use: Never     MEDICATIONS    Home Medication:    Current Medication:  Current Facility-Administered Medications:    0.9 %  sodium chloride infusion, 250 mL, Intravenous, PRN, Agbata, Tochukwu, MD   acetaminophen (TYLENOL) tablet 650 mg, 650 mg, Oral, Q6H PRN, 650 mg at 04/19/22 1422 **OR** acetaminophen (TYLENOL) suppository 650 mg, 650 mg, Rectal, Q6H PRN, Agbata, Tochukwu, MD   acetylcysteine (MUCOMYST) 20 % nebulizer / oral solution 4 mL, 4 mL, Nebulization, TID, Fritzi Mandes, MD, 4 mL at 04/19/22 1326   aspirin EC tablet 81 mg, 81 mg, Oral, Daily, Agbata, Tochukwu, MD, 81 mg at 04/19/22 1020   atorvastatin (LIPITOR) tablet 40 mg, 40 mg, Oral, Daily, Fritzi Mandes, MD, 40 mg at 04/19/22 1020   azithromycin (ZITHROMAX) tablet 250 mg, 250 mg, Oral, Daily, Fritzi Mandes, MD, 250 mg at 04/19/22 0900   cefTRIAXone (ROCEPHIN) 2 g in sodium chloride 0.9 % 100 mL IVPB, 2 g, Intravenous, Q24H, Agbata, Tochukwu, MD, Last Rate: 200 mL/hr at 04/18/22 2239, 2 g at 04/18/22 2239   clopidogrel (PLAVIX) tablet 75 mg, 75 mg, Oral, Daily, Fritzi Mandes, MD, 75 mg at 04/19/22 1020   enoxaparin (LOVENOX) injection 40 mg, 40 mg, Subcutaneous, Q12H, Darrick Penna, RPH, 40 mg at 04/19/22 1019   feeding supplement (ENSURE ENLIVE / ENSURE PLUS) liquid 237 mL, 237 mL, Oral, BID BM, Posey Pronto, Sona,  MD, 237 mL at 04/19/22 1500   ferrous sulfate tablet 325 mg, 325 mg, Oral, TID WC, Fritzi Mandes, MD, 325 mg at 04/19/22 1522   furosemide (LASIX) injection 40 mg, 40 mg, Intravenous, Daily, Agbata, Tochukwu, MD, 40 mg at 04/19/22 1022   hydrALAZINE (APRESOLINE) tablet 12.5 mg, 12.5 mg, Oral, BID, Fritzi Mandes, MD, 12.5 mg at 04/19/22 1022   ipratropium-albuterol (DUONEB) 0.5-2.5 (3) MG/3ML nebulizer solution 3 mL, 3 mL, Nebulization, Q6H PRN, Agbata, Tochukwu, MD, 3 mL at 04/19/22 1326   isosorbide mononitrate (IMDUR) 24 hr tablet 60 mg, 60 mg, Oral, Daily, Fritzi Mandes, MD, 60 mg at 04/19/22 1020   losartan (COZAAR) tablet 25  mg, 25 mg, Oral, Q1200, Fritzi Mandes, MD, 25 mg at 04/19/22 1421   metoprolol succinate (TOPROL-XL) 24 hr tablet 50 mg, 50 mg, Oral, Daily, Agbata, Tochukwu, MD, 50 mg at 04/19/22 1020   mirtazapine (REMERON SOL-TAB) disintegrating tablet 45 mg, 45 mg, Oral, QHS, Fritzi Mandes, MD   mometasone-formoterol (DULERA) 200-5 MCG/ACT inhaler 2 puff, 2 puff, Inhalation, BID, Agbata, Tochukwu, MD, 2 puff at 04/19/22 0900   multivitamin with minerals tablet 1 tablet, 1 tablet, Oral, Daily, Fritzi Mandes, MD, 1 tablet at 04/19/22 1522   ondansetron (ZOFRAN) tablet 4 mg, 4 mg, Oral, Q6H PRN **OR** ondansetron (ZOFRAN) injection 4 mg, 4 mg, Intravenous, Q6H PRN, Agbata, Tochukwu, MD   pantoprazole (PROTONIX) EC tablet 40 mg, 40 mg, Oral, BID AC, Fritzi Mandes, MD, 40 mg at 04/19/22 1522   polyethylene glycol (MIRALAX / GLYCOLAX) packet 17 g, 17 g, Oral, Daily PRN, Fritzi Mandes, MD   potassium chloride SA (KLOR-CON M) CR tablet 40 mEq, 40 mEq, Oral, Daily, Agbata, Tochukwu, MD, 40 mEq at 04/19/22 1020   predniSONE (DELTASONE) tablet 40 mg, 40 mg, Oral, Q breakfast, Agbata, Tochukwu, MD, 40 mg at 04/19/22 0900   QUEtiapine (SEROQUEL XR) 24 hr tablet 100 mg, 100 mg, Oral, QHS, Patel, Sona, MD   sodium chloride flush (NS) 0.9 % injection 3 mL, 3 mL, Intravenous, Q12H, Agbata, Tochukwu, MD, 3 mL at 04/19/22 1000   sodium chloride flush (NS) 0.9 % injection 3 mL, 3 mL, Intravenous, PRN, Agbata, Tochukwu, MD    ALLERGIES   Patient has no allergy information on record.     REVIEW OF SYSTEMS    Review of Systems:  Gen:  Denies  fever, sweats, chills weigh loss  HEENT: Denies blurred vision, double vision, ear pain, eye pain, hearing loss, nose bleeds, sore throat Cardiac:  No dizziness, chest pain or heaviness, chest tightness,edema Resp:   + cough or sputum porduction, + shortness of breath,+ wheezing, -hemoptysis,  Gi: Denies swallowing difficulty, stomach pain,+ nausea no vomiting,- diarrhea, constipation,  bowel incontinence Gu:  Denies bladder incontinence, burning urine Ext:   Denies Joint pain, stiffness or swelling Skin: Denies  skin rash, easy bruising or bleeding or hives Endoc:  Denies polyuria, polydipsia , polyphagia or weight change Psych:   Denies depression, insomnia or hallucinations   Other:  All other systems negative   VS: BP (!) 142/46 (BP Location: Left Arm)   Pulse 77   Temp 97.7 F (36.5 C) (Oral)   Resp 18   Ht 4' 11"$  (1.499 m)   Wt 39.9 kg   SpO2 95%   BMI 17.77 kg/m      PHYSICAL EXAM    GENERAL:no fevers, chills, no weakness  HEAD: Normocephalic, atraumatic.  EYES: Pupils equal, round, reactive to light. Extraocular muscles intact. No scleral icterus.  MOUTH: Moist mucosal membrane. Dentition intact. No abscess noted.  EAR, NOSE, THROAT: Clear without exudates. No external lesions.  NECK: Supple. No thyromegaly. No nodules. No JVD.  PULMONARY: +wheezes, no consolidation CARDIOVASCULAR: S1 and S2. Regular rate and rhythm. No murmurs, rubs, or gallops. No edema. Pedal pulses 2+ bilaterally.  GASTROINTESTINAL: Soft, nontender, nondistended. No masses. Positive bowel sounds. No hepatosplenomegaly.  MUSCULOSKELETAL: No swelling, clubbing, or edema. Range of motion full in all extremities.  NEUROLOGIC: Cranial nerves II through XII are intact. No gross focal neurological deficits. Sensation intact. Reflexes intact.  SKIN: No ulceration, lesions, rashes, or cyanosis. Skin warm and dry. Turgor intact.  PSYCHIATRIC: Mood, affect within normal limits. The patient is awake, alert and oriented x 3. Insight, judgment intact.       IMAGING    CT Angio Chest PE W and/or Wo Contrast  Result Date: 04/18/2022 CLINICAL DATA:  PE suspected, shortness of breath EXAM: CT ANGIOGRAPHY CHEST WITH CONTRAST TECHNIQUE: Multidetector CT imaging of the chest was performed using the standard protocol during bolus administration of intravenous contrast. Multiplanar CT image  reconstructions and MIPs were obtained to evaluate the vascular anatomy. RADIATION DOSE REDUCTION: This exam was performed according to the departmental dose-optimization program which includes automated exposure control, adjustment of the mA and/or kV according to patient size and/or use of iterative reconstruction technique. CONTRAST:  59m OMNIPAQUE IOHEXOL 350 MG/ML SOLN COMPARISON:  01/14/2022 FINDINGS: Cardiovascular: Satisfactory opacification of the pulmonary arteries to the segmental level. No evidence of pulmonary embolism. Cardiomegaly. Three-vessel coronary artery calcifications. No pericardial effusion. Aortic atherosclerosis. Mediastinum/Nodes: Numerous enlarged mediastinal and hilar lymph nodes, not significantly changed compared to prior examination. Subcarinal lymph node measures up to 2.2 x 2.0 cm (series 5, image 58). Thyroid gland, trachea, and esophagus demonstrate no significant findings. Lungs/Pleura: Severe emphysema. Diffuse bilateral bronchial wall thickening and extensive bronchiolar plugging in the lower lobes. Interlobular septal thickening throughout the lungs with small bilateral pleural effusions. Upper Abdomen: Please see separately reported examination of the abdomen and pelvis. Musculoskeletal: No chest wall abnormality. No acute osseous findings. Review of the MIP images confirms the above findings. IMPRESSION: 1. Negative examination for pulmonary embolism. 2. Severe emphysema. 3. Diffuse bilateral bronchial wall thickening and extensive bronchiolar plugging in the lower lobes. 4. Interlobular septal thickening throughout the lungs with small bilateral pleural effusions, consistent with pulmonary edema. 5. Numerous enlarged mediastinal and hilar lymph nodes, not significantly changed compared to prior examination and likely reactive. 6. Cardiomegaly and coronary artery disease. Aortic Atherosclerosis (ICD10-I70.0) and Emphysema (ICD10-J43.9). Electronically Signed   By: ADelanna AhmadiM.D.   On: 04/18/2022 18:19   CT ABDOMEN PELVIS W CONTRAST  Result Date: 04/18/2022 CLINICAL DATA:  Abdominal pain, shortness of breath EXAM: CT ABDOMEN AND PELVIS WITH CONTRAST TECHNIQUE: Multidetector CT imaging of the abdomen and pelvis was performed using the standard protocol following bolus administration of intravenous contrast. RADIATION DOSE REDUCTION: This exam was performed according to the departmental dose-optimization program which includes automated exposure control, adjustment of the mA and/or kV according to patient size and/or use of iterative reconstruction technique. CONTRAST:  752mOMNIPAQUE IOHEXOL 350 MG/ML SOLN COMPARISON:  01/14/2022 FINDINGS: Lower chest: Please see separately reported examination of the chest. Hepatobiliary: No focal liver abnormality is seen. Status post cholecystectomy. No biliary dilatation. Pancreas: Unremarkable. No pancreatic ductal dilatation or surrounding inflammatory changes. Spleen: Normal in size without significant abnormality. Adrenals/Urinary Tract: Adrenal glands are unremarkable. Renal vascular calcifications without evidence of calculi. Kidneys are otherwise normal,  without renal calculi, solid lesion, or hydronephrosis. Bladder is unremarkable. Stomach/Bowel: Stomach is within normal limits. Appendix appears normal. No evidence of bowel wall thickening, distention, or inflammatory changes. Vascular/Lymphatic: Aortic atherosclerosis. No enlarged abdominal or pelvic lymph nodes. Reproductive: Status post hysterectomy. Other: No abdominal wall hernia.  Anasarca.  No ascites. Musculoskeletal: No acute or significant osseous findings. IMPRESSION: 1. No acute CT findings of the abdomen or pelvis to explain abdominal pain. 2. Anasarca. 3. Status post cholecystectomy and hysterectomy. Aortic Atherosclerosis (ICD10-I70.0). Electronically Signed   By: Delanna Ahmadi M.D.   On: 04/18/2022 18:11   DG Chest Port 1 View  Result Date: 04/18/2022 CLINICAL  DATA:  Shortness of breath. EXAM: PORTABLE CHEST 1 VIEW COMPARISON:  None Available. FINDINGS: AP portable view of the chest demonstrates diffuse interstitial lung markings with patchy parenchymal lung densities particularly in the right lung. Cannot exclude pulmonary nodules in the right mid lung region. Heart size is normal. Atherosclerotic calcifications at the aortic arch. Blunting at the costophrenic angles are suspicious for pleural effusions. Negative for a pneumothorax. Probable scarring at the lung apices. IMPRESSION: 1. Patchy parenchymal lung densities particularly in the right lung. Findings are concerning for multifocal pneumonia. 2. Cannot exclude pulmonary nodules in the right mid lung region. Recommend further characterization with a chest CT with IV contrast. 3. Probable small bilateral pleural effusions. 4. Diffuse interstitial lung markings. Electronically Signed   By: Markus Daft M.D.   On: 04/18/2022 15:32      ASSESSMENT/PLAN   This is a pleasant lad with advanced copd. Here with an exacerbation Will treat as sucg as we doing  There are signs of possible pneumonia, procalcitonin 0.25 Will treat as such for now, antibiotic choice is appropriate  Also she has mild pulm edema Diuresis Follow renal status and lytes  Anemia, nl MCH, appears due to chronic illness following     Thank you for allowing me to participate in the care of this patient.   Patient/Family are satisfied with care plan and all questions have been answered.  This document was prepared using Dragon voice recognition software and may include unintentional dictation errors.     Wallene Huh, M.D.  Division of [facility]

## 2022-04-19 NOTE — Consult Note (Signed)
   Heart Failure Nurse Navigator Note  HFpEF 55 to 60%.  Grade 2 diastolic dysfunction.  She presented to the emergency room with complaints of worsening shortness of breath.  Per the son she has also had poor oral intake, intermittent fever, chills, orthopnea and productive cough.  BNP was 746.  Abdominal CT with anasarca.  CT chest with severe emphysema.  Mild bilateral pleural effusions and pulmonary edema.  Comorbidities:  End-stage COPD with chronic O2 use Interstitial lung disease Atrial fibrillation Hypertension Hyperlipidemia Depression GERD  Medications:  Aspirin 81 mg daily Atorvastatin 40 mg daily Plavix 75 mg daily Furosemide 40 mg IV daily Hydralazine 12.5 mg 2 times a day Isosorbide mononitrate 60 mg daily Losartan 25 mg daily Metoprolol succinate 50 mg daily Potassium chloride 40 mEq daily  Labs:  Sodium 134, potassium 4.2, chloride 92, CO2 30, BUN 17, creatinine 0.74, estimated GFR 60.  WBC 11.1, hemoglobin 8.3, hematocrit 25.1 Weight is 39.3 kg Intake 1757 mL Output 1150 mL BMP 17.7   Initial meeting with patient and her daughter who was at the bedside.  Discussed heart failure and what it means.  Daughter states that her mother lives with various family members they take turns so she does not have to stay alone.  No further low sodium diet and abstaining from salt.  Recommended 2000 mg of sodium or less daily.  Also discussed foods to avoid.  So went over fluid restriction of no more than 64 ounces in a days time.  Discussed daily weights, recording and what to report.  Also discussed reporting changes in signs and symptoms.  They were given the living with heart failure teaching booklet, zone magnet, info on low-sodium and heart failure along with weight chart.  They have follow-up in the outpatient heart failure clinic on February 28 at 10:30 AM.  [place]

## 2022-04-19 NOTE — Consult Note (Signed)
ANTICOAGULATION CONSULT NOTE - Initial Consult  Pharmacy Consult for enoxaparin subQ Indication: atrial fibrillation  Not on File  Patient Measurements: Height: 4' 11"$  (149.9 cm) Weight: 39.9 kg (87 lb 15.4 oz) IBW/kg (Calculated) : 43.2 Heparin Dosing Weight: 39.3 kg  Vital Signs: Temp: 97.7 F (36.5 C) (02/13 0739) Temp Source: Oral (02/13 0739) BP: 153/56 (02/13 0739) Pulse Rate: 79 (02/13 0739)  Labs: Recent Labs    04/18/22 1504 04/18/22 1615 04/19/22 0422  HGB 9.3*  --  8.3*  HCT 28.7*  --  25.1*  PLT 318  --  317  CREATININE 0.72  --  0.74  TROPONINIHS 45* 35*  --      Estimated Creatinine Clearance: 39.4 mL/min (by C-G formula based on SCr of 0.74 mg/dL).   Medical History: Past Medical History:  Diagnosis Date   COPD (chronic obstructive pulmonary disease) ([facility])    Coronary artery disease    Depression    Dysrhythmia     Medications:  Scheduled:   aspirin EC  81 mg Oral Daily   enoxaparin (LOVENOX) injection  40 mg Subcutaneous Q12H   furosemide  40 mg Intravenous Daily   metoprolol succinate  50 mg Oral Daily   mometasone-formoterol  2 puff Inhalation BID   potassium chloride  40 mEq Oral Daily   predniSONE  40 mg Oral Q breakfast   sodium chloride flush  3 mL Intravenous Q12H    Assessment: 72 year old female with Afib and COPD. Pharmacy consulted to dose Lovenox for stroke prevention on afib.   Goal of Therapy:  Monitor platelets by anticoagulation protocol: Yes   Plan:  Continue Lovenox 64m (145mkg) subQ twice daily Hgb dowbn from 9.3 to 8.3 (no s/sx of bleeding), Plr stable at 317. CBC at least every 72 hrs while on enoxaparin therapy  Shanelle Clontz Rodriguez-Guzman PharmD, BCPS 04/19/2022 7:56 AM

## 2022-04-20 DIAGNOSIS — E440 Moderate protein-calorie malnutrition: Secondary | ICD-10-CM | POA: Insufficient documentation

## 2022-04-20 DIAGNOSIS — J9621 Acute and chronic respiratory failure with hypoxia: Secondary | ICD-10-CM | POA: Diagnosis not present

## 2022-04-20 MED ORDER — ENOXAPARIN SODIUM 30 MG/0.3ML IJ SOSY
30.0000 mg | PREFILLED_SYRINGE | INTRAMUSCULAR | Status: DC
  Administered 2022-04-21: 30 mg via SUBCUTANEOUS
  Filled 2022-04-20: qty 0.3

## 2022-04-20 MED ORDER — AMIODARONE HCL 200 MG PO TABS
200.0000 mg | ORAL_TABLET | Freq: Every day | ORAL | Status: DC
  Administered 2022-04-20 – 2022-04-21 (×2): 200 mg via ORAL
  Filled 2022-04-20 (×2): qty 1

## 2022-04-20 NOTE — Evaluation (Signed)
Physical Therapy Evaluation Patient Details Name: Kristi Clark MRN: [id number] DOB: 25-Nov-1950 Today's Date: 04/20/2022  History of Present Illness  Pt admitted for acute on chronic resp failure. HIstory includes COPD, CRF 3 L of O2 at baseline, HTN, depression, GERD and recent hospital admission. Reports history of 3 recent falls.  Clinical Impression  Pt is a pleasant 72 year old female who was admitted for acute on chronic resp failure. Pt performs bed mobility, transfers, and ambulation with cga and RW. All mobility performed on 3L of O2 with sats at 85% with limited exertion. Cues for PLB and rebound to 90%.  Pt demonstrates deficits with strength/mobility/endurance. Would benefit from skilled PT to address above deficits and promote optimal return to PLOF. Recommend transition to [place] upon discharge from acute hospitalization.       Recommendations for follow up therapy are one component of a multi-disciplinary discharge planning process, led by the attending physician.  Recommendations may be updated based on patient status, additional functional criteria and insurance authorization.  Follow Up Recommendations Home health PT      Assistance Recommended at Discharge Set up Supervision/Assistance  Patient can return home with the following  A little help with walking and/or transfers;A little help with bathing/dressing/bathroom;Help with stairs or ramp for entrance    Equipment Recommendations None recommended by PT  Recommendations for Other Services       Functional Status Assessment Patient has had a recent decline in their functional status and demonstrates the ability to make significant improvements in function in a reasonable and predictable amount of time.     Precautions / Restrictions Precautions Precautions: Fall Restrictions Weight Bearing Restrictions: No      Mobility  Bed Mobility Overal bed mobility: Needs Assistance Bed Mobility: Supine to Sit      Supine to sit: Min guard     General bed mobility comments: safe technique with upright posture. All mobility performed on 3L of o2    Transfers Overall transfer level: Needs assistance Equipment used: Rolling walker (2 wheels) Transfers: Sit to/from Stand Sit to Stand: Min guard           General transfer comment: cues for hand placement. RW used    Ambulation/Gait Ambulation/Gait assistance: Counsellor (Feet): 15 Feet Assistive device: Rolling walker (2 wheels) Gait Pattern/deviations: Step-to pattern       General Gait Details: ambulated around room to chair. Step to gait pattern and RW. O2 sats decrease to 85% with exertion with cues for PLB to improve sats  Stairs            Wheelchair Mobility    Modified Rankin (Stroke Patients Only)       Balance Overall balance assessment: Needs assistance, History of Falls Sitting-balance support: Feet supported, Bilateral upper extremity supported Sitting balance-Leahy Scale: Good     Standing balance support: Bilateral upper extremity supported Standing balance-Leahy Scale: Fair                               Pertinent Vitals/Pain Pain Assessment Pain Assessment: No/denies pain    Home Living Family/patient expects to be discharged to:: Private residence Living Arrangements: Other relatives (lives with niece and grandson) Available Help at Discharge: Family;Available 24 hours/day Type of Home: House Home Access: Stairs to enter Entrance Stairs-Rails: Can reach both Entrance Stairs-Number of Steps: 3   Home Layout: One level Home Equipment: Conservation officer, nature (2 wheels);Shower seat Additional  Comments: reports she may go live with daughter. THe above home set up reflects her niece and grandson's home set up    Prior Function Prior Level of Function : Needs assist;History of Falls (last six months)             Mobility Comments: reports 3 recent falls at home. Was previously  using RW.       Hand Dominance        Extremity/Trunk Assessment   Upper Extremity Assessment Upper Extremity Assessment: Generalized weakness (B UE grossly 3+/5)    Lower Extremity Assessment Lower Extremity Assessment: Generalized weakness (B LE grossly 3+/5)       Communication   Communication: No difficulties  Cognition Arousal/Alertness: Awake/alert Behavior During Therapy: WFL for tasks assessed/performed Overall Cognitive Status: Within Functional Limits for tasks assessed                                          General Comments      Exercises     Assessment/Plan    PT Assessment Patient needs continued PT services  PT Problem List Decreased strength;Decreased activity tolerance;Decreased balance;Decreased mobility       PT Treatment Interventions Gait training;Therapeutic exercise;Balance training;DME instruction    PT Goals (Current goals can be found in the Care Plan section)  Acute Rehab PT Goals Patient Stated Goal: to go home PT Goal Formulation: With patient Time For Goal Achievement: 05/04/22 Potential to Achieve Goals: Good    Frequency Min 2X/week     Co-evaluation               AM-PAC PT "6 Clicks" Mobility  Outcome Measure Help needed turning from your back to your side while in a flat bed without using bedrails?: None Help needed moving from lying on your back to sitting on the side of a flat bed without using bedrails?: A Little Help needed moving to and from a bed to a chair (including a wheelchair)?: A Little Help needed standing up from a chair using your arms (e.g., wheelchair or bedside chair)?: A Little Help needed to walk in hospital room?: A Little Help needed climbing 3-5 steps with a railing? : A Little 6 Click Score: 19    End of Session Equipment Utilized During Treatment: Gait belt Activity Tolerance: Patient tolerated treatment well Patient left: in chair;with chair alarm set Nurse  Communication: Mobility status PT Visit Diagnosis: Unsteadiness on feet (R26.81);Muscle weakness (generalized) (M62.81);Difficulty in walking, not elsewhere classified (R26.2);History of falling (Z91.81)    Time: [id number] PT Time Calculation (min) (ACUTE ONLY): 13 min   Charges:   PT Evaluation $PT Eval Low Complexity: 1 Low          Greggory Stallion, PT, DPT, GCS [phone]   Liem Copenhaver 04/20/2022, 4:29 PM

## 2022-04-20 NOTE — Plan of Care (Signed)
Problem: Education: Goal: Ability to demonstrate management of disease process will improve Outcome: Progressing Goal: Ability to verbalize understanding of medication therapies will improve Outcome: Progressing Goal: Individualized Educational Video(s) Outcome: Progressing   Problem: Activity: Goal: Capacity to carry out activities will improve Outcome: Progressing   Problem: Cardiac: Goal: Ability to achieve and maintain adequate cardiopulmonary perfusion will improve Outcome: Progressing   Problem: Education: Goal: Knowledge of disease or condition will improve Outcome: Progressing Goal: Knowledge of the prescribed therapeutic regimen will improve Outcome: Progressing Goal: Individualized Educational Video(s) Outcome: Progressing   Problem: Activity: Goal: Ability to tolerate increased activity will improve Outcome: Progressing Goal: Will verbalize the importance of balancing activity with adequate rest periods Outcome: Progressing   Problem: Respiratory: Goal: Ability to maintain a clear airway will improve Outcome: Progressing Goal: Levels of oxygenation will improve Outcome: Progressing Goal: Ability to maintain adequate ventilation will improve Outcome: Progressing   Problem: Activity: Goal: Ability to tolerate increased activity will improve Outcome: Progressing   Problem: Clinical Measurements: Goal: Ability to maintain a body temperature in the normal range will improve Outcome: Progressing   Problem: Respiratory: Goal: Ability to maintain adequate ventilation will improve Outcome: Progressing Goal: Ability to maintain a clear airway will improve Outcome: Progressing   Problem: Education: Goal: Knowledge of General Education information will improve Description: Including pain rating scale, medication(s)/side effects and non-pharmacologic comfort measures Outcome: Progressing   Problem: Health Behavior/Discharge Planning: Goal: Ability to manage  health-related needs will improve Outcome: Progressing   Problem: Clinical Measurements: Goal: Ability to maintain clinical measurements within normal limits will improve Outcome: Progressing Goal: Will remain free from infection Outcome: Progressing Goal: Diagnostic test results will improve Outcome: Progressing Goal: Respiratory complications will improve Outcome: Progressing Goal: Cardiovascular complication will be avoided Outcome: Progressing   Problem: Activity: Goal: Risk for activity intolerance will decrease Outcome: Progressing   Problem: Nutrition: Goal: Adequate nutrition will be maintained Outcome: Progressing   Problem: Coping: Goal: Level of anxiety will decrease Outcome: Progressing   Problem: Elimination: Goal: Will not experience complications related to bowel motility Outcome: Progressing Goal: Will not experience complications related to urinary retention Outcome: Progressing   Problem: Pain Managment: Goal: General experience of comfort will improve Outcome: Progressing   Problem: Safety: Goal: Ability to remain free from injury will improve Outcome: Progressing   Problem: Skin Integrity: Goal: Risk for impaired skin integrity will decrease Outcome: Progressing   Problem: Education: Goal: Knowledge of General Education information will improve Description: Including pain rating scale, medication(s)/side effects and non-pharmacologic comfort measures Outcome: Progressing   Problem: Health Behavior/Discharge Planning: Goal: Ability to manage health-related needs will improve Outcome: Progressing   Problem: Clinical Measurements: Goal: Ability to maintain clinical measurements within normal limits will improve Outcome: Progressing Goal: Will remain free from infection Outcome: Progressing Goal: Diagnostic test results will improve Outcome: Progressing Goal: Respiratory complications will improve Outcome: Progressing Goal: Cardiovascular  complication will be avoided Outcome: Progressing   Problem: Activity: Goal: Risk for activity intolerance will decrease Outcome: Progressing   Problem: Nutrition: Goal: Adequate nutrition will be maintained Outcome: Progressing   Problem: Coping: Goal: Level of anxiety will decrease Outcome: Progressing   Problem: Education: Goal: Ability to demonstrate management of disease process will improve Outcome: Progressing Goal: Ability to verbalize understanding of medication therapies will improve Outcome: Progressing Goal: Individualized Educational Video(s) Outcome: Progressing   Problem: Activity: Goal: Capacity to carry out activities will improve Outcome: Progressing   Problem: Cardiac: Goal: Ability to achieve and maintain adequate cardiopulmonary  perfusion will improve Outcome: Progressing

## 2022-04-20 NOTE — Progress Notes (Signed)
PROGRESS NOTE    Allicia Ruppenthal  [id number] DOB: 08-06-1950 DOA: 04/18/2022 PCP: [name], DO  [id number]  LOS: 2 days   Brief hospital course:   Assessment & Plan: Iridian Heinzman is a 72 y.o. 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      CT scan of abdomen and pelvis with contrast shows no acute CT findings of the abdomen or pelvis to explain abdominal pain. Anasarca. Status post cholecystectomy and hysterectomy.   CT angiogram of the chest with contrast was negative examination for pulmonary embolism. Severe emphysema. Diffuse bilateral bronchial wall thickening and extensive bronchiolar plugging in the lower lobes. Interlobular septal thickening throughout the lungs with small bilateral pleural effusions, consistent with pulmonary edema. Numerous enlarged mediastinal and hilar lymph nodes, not significantly changed compared to prior examination and likely reactive. Cardiomegaly and coronary artery disease.   Acute on chronic hypoxic respiratory failure secondary to  severe emphysema/COPD exacerbation with bronchopneumonia Acute on chronic diastolic congestive heart failure -- patient chronically on 3 L nasal cannula oxygen -- she was noted to be hypoxic at 79% on 3 L. Bumped it up to 5 L. --Continue supplemental O2 to keep sats between 88-92%, wean as tolerated  severe emphysema/COPD exacerbation with bronchopneumonia --pulm consulted with Dr. Raul Del Plan: --cont empiric ceftriaxone and azithromycin --cont prednisone 40 mg daily --cont  Mucomyst neb --cont bronchodilators  Acute on chronic diastolic congestive heart failure --cont IV lasix 40 mg daily   Sepsis due to bronchopneumonia -- noted to be tachypnea tachycardic with elevated white count and abnormal chest x-ray. -- Blood cultures negative -- continue antibiotics as above    Hypokalemia --monitor and replete PRN   a fib with RVR in the setting of sepsis and chf (h/o afib) -- patient is back in sinus rhythm --cont metop --resume home amiodarone   CAD --stable --cont ASA and statin   Gen weakness/deconditioning --PT    DVT prophylaxis: Lovenox SQ Code Status: DNR  Family Communication: daughter updated at bedside today Level of care: Progressive Dispo:   The patient is from: home Anticipated d/c is to: likely home Anticipated d/c date is: 1-2 days   Subjective and Interval History:  Pt reported breathing improved, however, still having trouble bringing up sputum.   Objective: Vitals:   04/20/22 0502 04/20/22 0819 04/20/22 1213 04/20/22 1309  BP: (!) 156/50 (!) 163/50 (!) 150/60   Pulse: 72 78 76 75  Resp: 20 16 16 18  $ Temp: 97.6 F (36.4 C) (!) 97.5 F (36.4 C) 98.1 F (36.7 C)   TempSrc: Oral     SpO2: 98% 97% 100% 95%  Weight:      Height:        Intake/Output Summary (Last 24 hours) at 04/20/2022 1325 Last data filed at 04/20/2022 1000 Gross per 24 hour  Intake 240 ml  Output 1500 ml  Net -1260 ml   Filed Weights   04/18/22 2050 04/19/22 0520 04/20/22 0500  Weight: 39.3 kg 39.9 kg 40.9 kg    Examination:   Constitutional: NAD, AAOx3 HEENT: conjunctivae and lids normal, EOMI CV: No cyanosis.   RESP: normal  respiratory effort, on 3L Neuro: II - XII grossly intact.   Psych: Normal mood and affect.  Appropriate judgement and reason   Data Reviewed: I have personally reviewed labs and imaging studies  Time spent: 35 minutes  Enzo Bi, MD [facility] If 7PM-7AM, please contact night-coverage 04/20/2022,  1:25 PM

## 2022-04-21 DIAGNOSIS — J9621 Acute and chronic respiratory failure with hypoxia: Secondary | ICD-10-CM | POA: Diagnosis not present

## 2022-04-21 LAB — BASIC METABOLIC PANEL
Anion gap: 8 (ref 5–15)
BUN: 25 mg/dL — ABNORMAL HIGH (ref 8–23)
CO2: 33 mmol/L — ABNORMAL HIGH (ref 22–32)
Calcium: 8.8 mg/dL — ABNORMAL LOW (ref 8.9–10.3)
Chloride: 92 mmol/L — ABNORMAL LOW (ref 98–111)
Creatinine, Ser: 0.7 mg/dL (ref 0.44–1.00)
GFR, Estimated: >60 mL/min (ref >60)
Glucose, Bld: 140 mg/dL — ABNORMAL HIGH (ref 70–99)
Potassium: 4.7 mmol/L (ref 3.5–5.1)
Sodium: 133 mmol/L — ABNORMAL LOW (ref 135–145)

## 2022-04-21 LAB — CBC
HCT: 24.7 % — ABNORMAL LOW (ref 36.0–46.0)
Hemoglobin: 7.9 g/dL — ABNORMAL LOW (ref 12.0–15.0)
MCH: 30.2 pg (ref 26.0–34.0)
MCHC: 32 g/dL (ref 30.0–36.0)
MCV: 94.3 fL (ref 80.0–100.0)
Platelets: 354 10*3/uL (ref 150–400)
RBC: 2.62 MIL/uL — ABNORMAL LOW (ref 3.87–5.11)
RDW: 16.6 % — ABNORMAL HIGH (ref 11.5–15.5)
WBC: 10.5 10*3/uL (ref 4.0–10.5)
nRBC: 0 % (ref 0.0–0.2)

## 2022-04-21 LAB — MAGNESIUM: Magnesium: 2 mg/dL (ref 1.7–2.4)

## 2022-04-21 MED ORDER — ADULT MULTIVITAMIN W/MINERALS CH: 1.0000 | ORAL_TABLET | Freq: Every day | ORAL | Status: DC

## 2022-04-21 MED ORDER — ENSURE ENLIVE PO LIQD: 237.0000 mL | Freq: Two times a day (BID) | ORAL | 12 refills | Status: DC

## 2022-04-21 MED ORDER — CEFDINIR 300 MG PO CAPS
300.0000 mg | ORAL_CAPSULE | Freq: Two times a day (BID) | ORAL | Status: DC
  Administered 2022-04-21: 300 mg via ORAL
  Filled 2022-04-21: qty 1

## 2022-04-21 MED ORDER — AZITHROMYCIN 250 MG PO TABS
250.0000 mg | ORAL_TABLET | Freq: Every day | ORAL | Status: DC
  Administered 2022-04-21: 250 mg via ORAL
  Filled 2022-04-21: qty 1

## 2022-04-21 MED ORDER — CEFUROXIME AXETIL 500 MG PO TABS: 500.0000 mg | ORAL_TABLET | Freq: Two times a day (BID) | ORAL | 0 refills | Status: AC

## 2022-04-21 NOTE — Progress Notes (Signed)
Physical Therapy Treatment Patient Details Name: Kristi Clark MRN: [id number] DOB: March 18, 1950 Today's Date: 04/21/2022   History of Present Illness Pt admitted for acute on chronic resp failure. HIstory includes COPD, CRF 3 L of O2 at baseline, HTN, depression, GERD and recent hospital admission. Reports history of 3 recent falls.    PT Comments    Pt resting in recliner upon PT arrival; agreeable to therapy.  During session pt SBA with transfers and CGA with ambulation 40 feet x2 with RW use.  Switched pt to youth sized RW for improved fit (pt 4'11") and notified TOC that pt needing youth sized RW (regular sized RW was delivered to pt's room).  Pt's O2 sats 87-88% on 3 L O2 via nasal cannula during ambulation but recovered to 92-93% on 3 L within a couple minutes of sitting rest and vc's for pursed lip breathing.  Reviewed home safety, fall prevention, appropriate walker fit, and activity modification/pacing (pt and pt's daughter verbalizing appropriate understanding).  Pt plans to discharge to her daughter's home with plan for daughter to assist pt as needed.    Recommendations for follow up therapy are one component of a multi-disciplinary discharge planning process, led by the attending physician.  Recommendations may be updated based on patient status, additional functional criteria and insurance authorization.  Follow Up Recommendations  Home health PT     Assistance Recommended at Discharge Set up Supervision/Assistance  Patient can return home with the following A little help with walking and/or transfers;A little help with bathing/dressing/bathroom;Help with stairs or ramp for entrance   Equipment Recommendations  Rolling walker (2 wheels) (youth sized)    Recommendations for Other Services       Precautions / Restrictions Precautions Precautions: Fall Restrictions Weight Bearing Restrictions: No     Mobility  Bed Mobility               General bed mobility  comments: Deferred (pt sitting in recliner beginning/end of session)    Transfers Overall transfer level: Needs assistance Equipment used: Rolling walker (2 wheels) Transfers: Sit to/from Stand Sit to Stand: Supervision           General transfer comment: x4 sit to stand transfers from recliner; steady with RW use    Ambulation/Gait Ambulation/Gait assistance: Min guard Gait Distance (Feet):  (40 feet x2) Assistive device: Rolling walker (2 wheels)   Gait velocity: decreased     General Gait Details: partial step through gait pattern; steady with RW use   Stairs Stairs:  (pt's daughter reports no concerns for stairs management at home (they have preferred method that works for them) and don't feel like they need to practice it prior to discharge)           Wheelchair Mobility    Modified Rankin (Stroke Patients Only)       Balance Overall balance assessment: Needs assistance, History of Falls Sitting-balance support: No upper extremity supported, Feet supported Sitting balance-Leahy Scale: Good Sitting balance - Comments: steady sitting reaching within BOS   Standing balance support: Bilateral upper extremity supported, During functional activity, Reliant on assistive device for balance Standing balance-Leahy Scale: Good Standing balance comment: steady ambulating with RW use                            Cognition Arousal/Alertness: Awake/alert Behavior During Therapy: Anxious Overall Cognitive Status: Within Functional Limits for tasks assessed  Exercises      General Comments  Nursing cleared pt for participation in physical therapy.  Pt agreeable to PT session.  Pt's daughter arrived during session.      Pertinent Vitals/Pain Pain Assessment Pain Assessment: No/denies pain HR WFL during sessions activities.    Home Living                          Prior Function             PT Goals (current goals can now be found in the care plan section) Acute Rehab PT Goals Patient Stated Goal: to go home PT Goal Formulation: With patient Time For Goal Achievement: 05/04/22 Potential to Achieve Goals: Good Additional Goals Additional Goal #1: Pt will be able to perform bed mobility/transfers with mod I and safe technique in order to improve functional independence Progress towards PT goals: Progressing toward goals    Frequency    Min 2X/week      PT Plan Current plan remains appropriate    Co-evaluation              AM-PAC PT "6 Clicks" Mobility   Outcome Measure  Help needed turning from your back to your side while in a flat bed without using bedrails?: None Help needed moving from lying on your back to sitting on the side of a flat bed without using bedrails?: None Help needed moving to and from a bed to a chair (including a wheelchair)?: A Little Help needed standing up from a chair using your arms (e.g., wheelchair or bedside chair)?: A Little Help needed to walk in hospital room?: A Little Help needed climbing 3-5 steps with a railing? : A Little 6 Click Score: 20    End of Session Equipment Utilized During Treatment: Gait belt;Oxygen (3 L via nasal cannula) Activity Tolerance: Patient tolerated treatment well Patient left: in chair;with call bell/phone within reach;with chair alarm set;with family/visitor present Nurse Communication: Mobility status;Precautions;Other (comment) (pt needing youth sized RW delivered prior to discharge (has adult size currently)) PT Visit Diagnosis: Unsteadiness on feet (R26.81);Muscle weakness (generalized) (M62.81);Difficulty in walking, not elsewhere classified (R26.2);History of falling (Z91.81)     Time: [id number] PT Time Calculation (min) (ACUTE ONLY): 23 min  Charges:  $Gait Training: 8-22 mins $Therapeutic Activity: 8-22 mins                     Leitha Bleak, PT 04/21/22, 2:36  PM

## 2022-04-21 NOTE — Plan of Care (Signed)
Problem: Education: Goal: Ability to demonstrate management of disease process will improve 04/21/2022 1558 by Shauna Hugh, RN Outcome: Adequate for Discharge 04/21/2022 0818 by Shauna Hugh, RN Outcome: Progressing Goal: Ability to verbalize understanding of medication therapies will improve 04/21/2022 1558 by Shauna Hugh, RN Outcome: Adequate for Discharge 04/21/2022 0818 by Shauna Hugh, RN Outcome: Progressing Goal: Individualized Educational Video(s) 04/21/2022 1558 by Shauna Hugh, RN Outcome: Adequate for Discharge 04/21/2022 0818 by Shauna Hugh, RN Outcome: Progressing   Problem: Activity: Goal: Capacity to carry out activities will improve 04/21/2022 1558 by Shauna Hugh, RN Outcome: Adequate for Discharge 04/21/2022 0818 by Shauna Hugh, RN Outcome: Progressing   Problem: Cardiac: Goal: Ability to achieve and maintain adequate cardiopulmonary perfusion will improve 04/21/2022 1558 by Shauna Hugh, RN Outcome: Adequate for Discharge 04/21/2022 0818 by Shauna Hugh, RN Outcome: Progressing   Problem: Education: Goal: Knowledge of disease or condition will improve 04/21/2022 1558 by Shauna Hugh, RN Outcome: Adequate for Discharge 04/21/2022 0818 by Shauna Hugh, RN Outcome: Progressing Goal: Knowledge of the prescribed therapeutic regimen will improve 04/21/2022 1558 by Shauna Hugh, RN Outcome: Adequate for Discharge 04/21/2022 0818 by Shauna Hugh, RN Outcome: Progressing Goal: Individualized Educational Video(s) 04/21/2022 1558 by Shauna Hugh, RN Outcome: Adequate for Discharge 04/21/2022 0818 by Shauna Hugh, RN Outcome: Progressing   Problem: Activity: Goal: Ability to tolerate increased activity will improve 04/21/2022 1558 by Shauna Hugh, RN Outcome: Adequate for Discharge 04/21/2022 0818 by Shauna Hugh, RN Outcome: Progressing Goal: Will verbalize the importance of balancing activity with adequate rest periods 04/21/2022 1558 by Shauna Hugh, RN Outcome: Adequate for Discharge 04/21/2022 0818 by Shauna Hugh, RN Outcome: Progressing   Problem: Respiratory: Goal: Ability to maintain a clear airway will improve 04/21/2022 1558 by Shauna Hugh, RN Outcome: Adequate for Discharge 04/21/2022 0818 by Shauna Hugh, RN Outcome: Progressing Goal: Levels of oxygenation will improve 04/21/2022 1558 by Shauna Hugh, RN Outcome: Adequate for Discharge 04/21/2022 0818 by Shauna Hugh, RN Outcome: Progressing Goal: Ability to maintain adequate ventilation will improve 04/21/2022 1558 by Shauna Hugh, RN Outcome: Adequate for Discharge 04/21/2022 0818 by Shauna Hugh, RN Outcome: Progressing   Problem: Activity: Goal: Ability to tolerate increased activity will improve 04/21/2022 1558 by Shauna Hugh, RN Outcome: Adequate for Discharge 04/21/2022 0818 by Shauna Hugh, RN Outcome: Progressing   Problem: Clinical Measurements: Goal: Ability to maintain a body temperature in the normal range will improve 04/21/2022 1558 by Shauna Hugh, RN Outcome: Adequate for Discharge 04/21/2022 0818 by Shauna Hugh, RN Outcome: Progressing   Problem: Respiratory: Goal: Ability to maintain adequate ventilation will improve 04/21/2022 1558 by Shauna Hugh, RN Outcome: Adequate for Discharge 04/21/2022 0818 by Shauna Hugh, RN Outcome: Progressing Goal: Ability to maintain a clear airway will improve 04/21/2022 1558 by Shauna Hugh, RN Outcome: Adequate for Discharge 04/21/2022 0818 by Shauna Hugh, RN Outcome: Progressing   Problem: Education: Goal: Knowledge of General Education information will improve Description: Including pain rating scale, medication(s)/side effects and non-pharmacologic comfort measures 04/21/2022 1558 by Shauna Hugh, RN Outcome: Adequate for Discharge 04/21/2022 0818 by Shauna Hugh, RN Outcome: Progressing   Problem: Health Behavior/Discharge Planning: Goal: Ability to manage health-related needs  will improve 04/21/2022 1558 by Shauna Hugh, RN Outcome: Adequate for Discharge 04/21/2022 0818 by Shauna Hugh, RN Outcome: Progressing   Problem: Clinical Measurements: Goal: Ability to maintain clinical measurements within normal limits will improve 04/21/2022 1558 by Shauna Hugh, RN Outcome: Adequate for Discharge 04/21/2022 0818 by Shauna Hugh, RN Outcome: Progressing Goal: Will remain free from  infection 04/21/2022 1558 by Shauna Hugh, RN Outcome: Adequate for Discharge 04/21/2022 0818 by Shauna Hugh, RN Outcome: Progressing Goal: Diagnostic test results will improve 04/21/2022 1558 by Shauna Hugh, RN Outcome: Adequate for Discharge 04/21/2022 0818 by Shauna Hugh, RN Outcome: Progressing Goal: Respiratory complications will improve 04/21/2022 1558 by Shauna Hugh, RN Outcome: Adequate for Discharge 04/21/2022 0818 by Shauna Hugh, RN Outcome: Progressing Goal: Cardiovascular complication will be avoided 04/21/2022 1558 by Shauna Hugh, RN Outcome: Adequate for Discharge 04/21/2022 0818 by Shauna Hugh, RN Outcome: Progressing   Problem: Activity: Goal: Risk for activity intolerance will decrease 04/21/2022 1558 by Shauna Hugh, RN Outcome: Adequate for Discharge 04/21/2022 0818 by Shauna Hugh, RN Outcome: Progressing   Problem: Nutrition: Goal: Adequate nutrition will be maintained 04/21/2022 1558 by Shauna Hugh, RN Outcome: Adequate for Discharge 04/21/2022 0818 by Shauna Hugh, RN Outcome: Progressing   Problem: Coping: Goal: Level of anxiety will decrease 04/21/2022 1558 by Shauna Hugh, RN Outcome: Adequate for Discharge 04/21/2022 0818 by Shauna Hugh, RN Outcome: Progressing   Problem: Elimination: Goal: Will not experience complications related to bowel motility 04/21/2022 1558 by Shauna Hugh, RN Outcome: Adequate for Discharge 04/21/2022 0818 by Shauna Hugh, RN Outcome: Progressing Goal: Will not experience complications related to  urinary retention 04/21/2022 1558 by Shauna Hugh, RN Outcome: Adequate for Discharge 04/21/2022 0818 by Shauna Hugh, RN Outcome: Progressing   Problem: Pain Managment: Goal: General experience of comfort will improve 04/21/2022 1558 by Shauna Hugh, RN Outcome: Adequate for Discharge 04/21/2022 0818 by Shauna Hugh, RN Outcome: Progressing   Problem: Safety: Goal: Ability to remain free from injury will improve 04/21/2022 1558 by Shauna Hugh, RN Outcome: Adequate for Discharge 04/21/2022 0818 by Shauna Hugh, RN Outcome: Progressing   Problem: Skin Integrity: Goal: Risk for impaired skin integrity will decrease 04/21/2022 1558 by Shauna Hugh, RN Outcome: Adequate for Discharge 04/21/2022 0818 by Shauna Hugh, RN Outcome: Progressing   Problem: Education: Goal: Knowledge of General Education information will improve Description: Including pain rating scale, medication(s)/side effects and non-pharmacologic comfort measures 04/21/2022 1558 by Shauna Hugh, RN Outcome: Adequate for Discharge 04/21/2022 0818 by Shauna Hugh, RN Outcome: Progressing   Problem: Health Behavior/Discharge Planning: Goal: Ability to manage health-related needs will improve 04/21/2022 1558 by Shauna Hugh, RN Outcome: Adequate for Discharge 04/21/2022 0818 by Shauna Hugh, RN Outcome: Progressing   Problem: Clinical Measurements: Goal: Ability to maintain clinical measurements within normal limits will improve 04/21/2022 1558 by Shauna Hugh, RN Outcome: Adequate for Discharge 04/21/2022 0818 by Shauna Hugh, RN Outcome: Progressing Goal: Will remain free from infection 04/21/2022 1558 by Shauna Hugh, RN Outcome: Adequate for Discharge 04/21/2022 0818 by Shauna Hugh, RN Outcome: Progressing Goal: Diagnostic test results will improve 04/21/2022 1558 by Shauna Hugh, RN Outcome: Adequate for Discharge 04/21/2022 0818 by Shauna Hugh, RN Outcome: Progressing Goal: Respiratory  complications will improve 04/21/2022 1558 by Shauna Hugh, RN Outcome: Adequate for Discharge 04/21/2022 0818 by Shauna Hugh, RN Outcome: Progressing Goal: Cardiovascular complication will be avoided 04/21/2022 1558 by Shauna Hugh, RN Outcome: Adequate for Discharge 04/21/2022 0818 by Shauna Hugh, RN Outcome: Progressing   Problem: Activity: Goal: Risk for activity intolerance will decrease 04/21/2022 1558 by Shauna Hugh, RN Outcome: Adequate for Discharge 04/21/2022 0818 by Shauna Hugh, RN Outcome: Progressing   Problem: Nutrition: Goal: Adequate nutrition will be maintained 04/21/2022 1558 by Shauna Hugh, RN Outcome: Adequate for Discharge 04/21/2022 0818 by Shauna Hugh, RN Outcome: Progressing   Problem: Coping: Goal: Level of anxiety will decrease 04/21/2022 1558 by Juleen China,  Fender Herder, RN Outcome: Adequate for Discharge 04/21/2022 0818 by Shauna Hugh, RN Outcome: Progressing   Problem: Education: Goal: Ability to demonstrate management of disease process will improve 04/21/2022 1558 by Shauna Hugh, RN Outcome: Adequate for Discharge 04/21/2022 0818 by Shauna Hugh, RN Outcome: Progressing Goal: Ability to verbalize understanding of medication therapies will improve 04/21/2022 1558 by Shauna Hugh, RN Outcome: Adequate for Discharge 04/21/2022 0818 by Shauna Hugh, RN Outcome: Progressing Goal: Individualized Educational Video(s) 04/21/2022 1558 by Shauna Hugh, RN Outcome: Adequate for Discharge 04/21/2022 0818 by Shauna Hugh, RN Outcome: Progressing   Problem: Activity: Goal: Capacity to carry out activities will improve 04/21/2022 1558 by Shauna Hugh, RN Outcome: Adequate for Discharge 04/21/2022 0818 by Shauna Hugh, RN Outcome: Progressing   Problem: Cardiac: Goal: Ability to achieve and maintain adequate cardiopulmonary perfusion will improve 04/21/2022 1558 by Shauna Hugh, RN Outcome: Adequate for Discharge 04/21/2022 0818 by Shauna Hugh,  RN Outcome: Progressing

## 2022-04-21 NOTE — Plan of Care (Signed)
Problem: Education: Goal: Ability to demonstrate management of disease process will improve Outcome: Progressing Goal: Ability to verbalize understanding of medication therapies will improve Outcome: Progressing Goal: Individualized Educational Video(s) Outcome: Progressing   Problem: Activity: Goal: Capacity to carry out activities will improve Outcome: Progressing   Problem: Cardiac: Goal: Ability to achieve and maintain adequate cardiopulmonary perfusion will improve Outcome: Progressing   Problem: Education: Goal: Knowledge of disease or condition will improve Outcome: Progressing Goal: Knowledge of the prescribed therapeutic regimen will improve Outcome: Progressing Goal: Individualized Educational Video(s) Outcome: Progressing   Problem: Activity: Goal: Ability to tolerate increased activity will improve Outcome: Progressing Goal: Will verbalize the importance of balancing activity with adequate rest periods Outcome: Progressing   Problem: Respiratory: Goal: Ability to maintain a clear airway will improve Outcome: Progressing Goal: Levels of oxygenation will improve Outcome: Progressing Goal: Ability to maintain adequate ventilation will improve Outcome: Progressing   Problem: Activity: Goal: Ability to tolerate increased activity will improve Outcome: Progressing   Problem: Clinical Measurements: Goal: Ability to maintain a body temperature in the normal range will improve Outcome: Progressing   Problem: Respiratory: Goal: Ability to maintain adequate ventilation will improve Outcome: Progressing Goal: Ability to maintain a clear airway will improve Outcome: Progressing   Problem: Education: Goal: Knowledge of General Education information will improve Description: Including pain rating scale, medication(s)/side effects and non-pharmacologic comfort measures Outcome: Progressing   Problem: Health Behavior/Discharge Planning: Goal: Ability to manage  health-related needs will improve Outcome: Progressing   Problem: Clinical Measurements: Goal: Ability to maintain clinical measurements within normal limits will improve Outcome: Progressing Goal: Will remain free from infection Outcome: Progressing Goal: Diagnostic test results will improve Outcome: Progressing Goal: Respiratory complications will improve Outcome: Progressing Goal: Cardiovascular complication will be avoided Outcome: Progressing   Problem: Activity: Goal: Risk for activity intolerance will decrease Outcome: Progressing   Problem: Nutrition: Goal: Adequate nutrition will be maintained Outcome: Progressing   Problem: Coping: Goal: Level of anxiety will decrease Outcome: Progressing   Problem: Elimination: Goal: Will not experience complications related to bowel motility Outcome: Progressing Goal: Will not experience complications related to urinary retention Outcome: Progressing   Problem: Pain Managment: Goal: General experience of comfort will improve Outcome: Progressing   Problem: Safety: Goal: Ability to remain free from injury will improve Outcome: Progressing   Problem: Skin Integrity: Goal: Risk for impaired skin integrity will decrease Outcome: Progressing   Problem: Education: Goal: Knowledge of General Education information will improve Description: Including pain rating scale, medication(s)/side effects and non-pharmacologic comfort measures Outcome: Progressing   Problem: Health Behavior/Discharge Planning: Goal: Ability to manage health-related needs will improve Outcome: Progressing   Problem: Clinical Measurements: Goal: Ability to maintain clinical measurements within normal limits will improve Outcome: Progressing Goal: Will remain free from infection Outcome: Progressing Goal: Diagnostic test results will improve Outcome: Progressing Goal: Respiratory complications will improve Outcome: Progressing Goal: Cardiovascular  complication will be avoided Outcome: Progressing   Problem: Activity: Goal: Risk for activity intolerance will decrease Outcome: Progressing   Problem: Nutrition: Goal: Adequate nutrition will be maintained Outcome: Progressing   Problem: Coping: Goal: Level of anxiety will decrease Outcome: Progressing   Problem: Elimination: Goal: Will not experience complications related to bowel motility Outcome: Progressing Goal: Will not experience complications related to urinary retention Outcome: Progressing   Problem: Pain Managment: Goal: General experience of comfort will improve Outcome: Progressing   Problem: Safety: Goal: Ability to remain free from injury will improve Outcome: Progressing   Problem: Skin Integrity:  Goal: Risk for impaired skin integrity will decrease Outcome: Progressing   Problem: Education: Goal: Ability to demonstrate management of disease process will improve Outcome: Progressing Goal: Ability to verbalize understanding of medication therapies will improve Outcome: Progressing Goal: Individualized Educational Video(s) Outcome: Progressing   Problem: Activity: Goal: Capacity to carry out activities will improve Outcome: Progressing   Problem: Cardiac: Goal: Ability to achieve and maintain adequate cardiopulmonary perfusion will improve Outcome: Progressing

## 2022-04-21 NOTE — Care Management Important Message (Signed)
Important Message  Patient Details  Name: Kristi Clark MRN: [id number] Date of Birth: 11/17/1950   Medicare Important Message Given:  Yes     Dannette Barbara 04/21/2022, 3:00 PM

## 2022-04-21 NOTE — TOC Initial Note (Signed)
Transition of Care [facility]) - Initial/Assessment Note    Patient Details  Name: Kristi Clark MRN: [id number] Date of Birth: 04/13/1950  Transition of Care [facility]) CM/SW Contact:    Tiburcio Bash, LCSW Phone Number: 04/21/2022, 10:37 AM  Clinical Narrative:                  CSW spoke with patient's daughter Alyse Low regarding [place] recommendations, reports patient is active with [facility]. At dc patient will be going to [name]'s home located at:  [address], [place] [address]  CSW has informed Malachy Mood with [facility] of above. Daughter requested RW be delivered to bedside, ordered via [employer]. Patient has o2 and dme hospital bed at home through [facility].   No further dc needs identified at this time.    Expected Discharge Plan: [place] Barriers to Discharge: Continued Medical Work up   Patient Goals and CMS Choice Patient states their goals for this hospitalization and ongoing recovery are:: to go home CMS Medicare.gov Compare Post Acute Care list provided to:: Patient Represenative (must comment) (daughter Alyse Low)   [place] ownership interest in [facility].provided to:: Patient    Expected Discharge Plan and Services       Living arrangements for the past 2 months: [place]                                      Prior Living Arrangements/Services Living arrangements for the past 2 months: [place] Lives with:: Relatives                   Activities of Daily Living Home Assistive Devices/Equipment: Environmental consultant (specify type), Bedside commode/3-in-1, Shower chair without back, Hospital bed ADL Screening (condition at time of admission) Patient's cognitive ability adequate to safely complete daily activities?: No Is the patient deaf or have difficulty hearing?: Yes Does the patient have difficulty seeing, even when wearing glasses/contacts?: Yes Does the patient have difficulty concentrating, remembering,  or making decisions?: Yes Patient able to express need for assistance with ADLs?: Yes Does the patient have difficulty dressing or bathing?: Yes Independently performs ADLs?: No Communication: Independent Dressing (OT): Needs assistance Grooming: Needs assistance Is this a change from baseline?: Pre-admission baseline Feeding: Independent Bathing: Needs assistance Is this a change from baseline?: Pre-admission baseline Toileting: Needs assistance Is this a change from baseline?: Pre-admission baseline In/Out Bed: Needs assistance Is this a change from baseline?: Pre-admission baseline Walks in Home: Needs assistance Is this a change from baseline?: Pre-admission baseline Does the patient have difficulty walking or climbing stairs?: Yes Weakness of Legs: Both Weakness of Arms/Hands: Both  Permission Sought/Granted                  Emotional Assessment              Admission diagnosis:  Acute on chronic respiratory failure with hypoxia (HCC) [J96.21] Pulmonary emphysema, unspecified emphysema type ([place]) [J43.9] Atrial fibrillation, unspecified type ([place]) [I48.91] Acute on chronic hypoxic respiratory failure ([place]) [J96.21] Patient Active Problem List   Diagnosis Date Noted   Malnutrition of moderate degree 04/20/2022   Acute on chronic hypoxic respiratory failure ([place]) 04/18/2022   Atrial fibrillation with RVR ([place]) 04/18/2022   Sepsis due to pneumonia ([employer]) 04/18/2022   Acute on chronic diastolic CHF (congestive heart failure) ([place]) 04/18/2022   Hypokalemia 04/18/2022   PCP:  [name], DO Pharmacy:  No Pharmacies Listed    Social Determinants of Health (SDOH) Social History: SDOH Screenings   Food Insecurity: Food Insecurity Present (04/18/2022)  Housing: Low Risk  (04/18/2022)  Transportation Needs: No Transportation Needs (04/18/2022)  Utilities: Not At Risk (04/18/2022)  Tobacco Use: Medium Risk (04/18/2022)   SDOH Interventions:      Readmission Risk Interventions     No data to display

## 2022-04-21 NOTE — Progress Notes (Signed)
Pulmonary Medicine          Date: 04/21/2022,   MRN# [id number] Kristi Clark [birth year]     AdmissionWeight: 39.3 kg                 CurrentWeight: 40.9 kg       HISTORY OF PRESENT ILLNESS   This am she is is in bed talking in full sentences, no use of accessory muscles.  No audible wheezing. Able to get to bed siide commode with assistance.  She is ok going home to be with her daughter Adonis Brook. She has end stage copd, on oxygen, frial. No cardiac sxs.     PAST MEDICAL HISTORY   Past Medical History:  Diagnosis Date   COPD (chronic obstructive pulmonary disease) ([place])    Coronary artery disease    Depression    Dysrhythmia      SURGICAL HISTORY   Past Surgical History:  Procedure Laterality Date   CORONARY ANGIOPLASTY       FAMILY HISTORY   History reviewed. No pertinent family history.   SOCIAL HISTORY   Social History   Tobacco Use   Smoking status: Former    Types: Cigarettes  Substance Use Topics   Drug use: Never     MEDICATIONS    Home Medication:    Current Medication:  Current Facility-Administered Medications:    0.9 %  sodium chloride infusion, 250 mL, Intravenous, PRN, Agbata, Tochukwu, MD   acetaminophen (TYLENOL) tablet 650 mg, 650 mg, Oral, Q6H PRN, 650 mg at 04/19/22 1422 **OR** acetaminophen (TYLENOL) suppository 650 mg, 650 mg, Rectal, Q6H PRN, Agbata, Tochukwu, MD   acetylcysteine (MUCOMYST) 20 % nebulizer / oral solution 4 mL, 4 mL, Nebulization, TID, Fritzi Mandes, MD, 4 mL at 04/20/22 1949   amiodarone (PACERONE) tablet 200 mg, 200 mg, Oral, Daily, Enzo Bi, MD, 200 mg at 04/21/22 0948   aspirin EC tablet 81 mg, 81 mg, Oral, Daily, Agbata, Tochukwu, MD, 81 mg at 04/21/22 0949   atorvastatin (LIPITOR) tablet 40 mg, 40 mg, Oral, Daily, Fritzi Mandes, MD, 40 mg at 04/21/22 0948   azithromycin (ZITHROMAX) tablet 250 mg, 250 mg, Oral, Daily, Fritzi Mandes, MD, 250 mg at 04/20/22 2206   cefTRIAXone (ROCEPHIN) 2 g in  sodium chloride 0.9 % 100 mL IVPB, 2 g, Intravenous, Q24H, Agbata, Tochukwu, MD, Last Rate: 200 mL/hr at 04/20/22 2029, 2 g at 04/20/22 2029   clopidogrel (PLAVIX) tablet 75 mg, 75 mg, Oral, Daily, Fritzi Mandes, MD, 75 mg at 04/21/22 0948   enoxaparin (LOVENOX) injection 30 mg, 30 mg, Subcutaneous, Q24H, Enzo Bi, MD, 30 mg at 04/21/22 0950   feeding supplement (ENSURE ENLIVE / ENSURE PLUS) liquid 237 mL, 237 mL, Oral, BID BM, Fritzi Mandes, MD, 237 mL at 04/21/22 0951   ferrous sulfate tablet 325 mg, 325 mg, Oral, TID WC, Fritzi Mandes, MD, 325 mg at 04/21/22 1137   furosemide (LASIX) injection 40 mg, 40 mg, Intravenous, Daily, Agbata, Tochukwu, MD, 40 mg at 04/21/22 0949   hydrALAZINE (APRESOLINE) tablet 12.5 mg, 12.5 mg, Oral, BID, Fritzi Mandes, MD, 12.5 mg at 04/21/22 0947   ipratropium-albuterol (DUONEB) 0.5-2.5 (3) MG/3ML nebulizer solution 3 mL, 3 mL, Nebulization, Q6H PRN, Agbata, Tochukwu, MD, 3 mL at 04/20/22 1949   isosorbide mononitrate (IMDUR) 24 hr tablet 60 mg, 60 mg, Oral, Daily, Fritzi Mandes, MD, 60 mg at 04/21/22 0949   losartan (COZAAR) tablet 25 mg, 25 mg, Oral, Q1200, Fritzi Mandes, MD, 25 mg  at 04/21/22 1138   metoprolol succinate (TOPROL-XL) 24 hr tablet 50 mg, 50 mg, Oral, Daily, Agbata, Tochukwu, MD, 50 mg at 04/21/22 0948   mirtazapine (REMERON SOL-TAB) disintegrating tablet 45 mg, 45 mg, Oral, QHS, Fritzi Mandes, MD, 45 mg at 04/20/22 2206   mometasone-formoterol (DULERA) 200-5 MCG/ACT inhaler 2 puff, 2 puff, Inhalation, BID, Agbata, Tochukwu, MD, 2 puff at 04/21/22 0950   multivitamin with minerals tablet 1 tablet, 1 tablet, Oral, Daily, Fritzi Mandes, MD, 1 tablet at 04/21/22 0948   ondansetron (ZOFRAN) tablet 4 mg, 4 mg, Oral, Q6H PRN **OR** ondansetron (ZOFRAN) injection 4 mg, 4 mg, Intravenous, Q6H PRN, Agbata, Tochukwu, MD   pantoprazole (PROTONIX) EC tablet 40 mg, 40 mg, Oral, BID AC, Fritzi Mandes, MD, 40 mg at 04/21/22 0948   polyethylene glycol (MIRALAX / GLYCOLAX) packet 17 g,  17 g, Oral, Daily PRN, Fritzi Mandes, MD   predniSONE (DELTASONE) tablet 40 mg, 40 mg, Oral, Q breakfast, Agbata, Tochukwu, MD, 40 mg at 04/21/22 0948   QUEtiapine (SEROQUEL XR) 24 hr tablet 100 mg, 100 mg, Oral, QHS, Patel, Sona, MD, 100 mg at 04/20/22 2204   sodium chloride flush (NS) 0.9 % injection 3 mL, 3 mL, Intravenous, Q12H, Agbata, Tochukwu, MD, 3 mL at 04/21/22 0951   sodium chloride flush (NS) 0.9 % injection 3 mL, 3 mL, Intravenous, PRN, Agbata, Tochukwu, MD    ALLERGIES   Patient has no allergy information on record.     REVIEW OF SYSTEMS    Review of Systems:  Gen:  Denies  fever, sweats, chills weigh loss  HEENT: Denies blurred vision, double vision, ear pain, eye pain, hearing loss, nose bleeds, sore throat Cardiac:  No dizziness, chest pain or heaviness, chest tightness,edema Resp:   Denies cough or sputum porduction, less shortness of breath,wheezing, hemoptysis,  Gi: Denies swallowing difficulty, stomach pain, nausea or vomiting, diarrhea, constipation, bowel incontinence Gu:  Denies bladder incontinence, burning urine Ext:   Denies Joint pain, stiffness or swelling Skin: Denies  skin rash, easy bruising or bleeding or hives Endoc:  Denies polyuria, polydipsia , polyphagia or weight change Psych:   Denies depression, insomnia or hallucinations   Other:  All other systems negative   VS: BP (!) 157/53 (BP Location: Left Arm)   Pulse 70   Temp 98 F (36.7 C) (Oral)   Resp 18   Ht 4' 11"$  (1.499 m)   Wt 40.9 kg   SpO2 100%   BMI 18.21 kg/m      PHYSICAL EXAM    GENERAL:NAD, no fevers, chills, no weakness no fatigue HEAD: Normocephalic, atraumatic.  EYES: Pupils equal, round, reactive to light. Extraocular muscles intact. No scleral icterus.  MOUTH: Moist mucosal membrane. Dentition intact. No abscess noted.  EAR, NOSE, THROAT: Clear without exudates. No external lesions.  NECK: Supple. No thyromegaly. No nodules. No JVD.  PULMONARY: few crackles  basally, no consolidation CARDIOVASCULAR: S1 and S2. Regular rate and rhythm. No murmurs, rubs, or gallops. No edema. Pedal pulses 2+ bilaterally.  GASTROINTESTINAL: Soft, nontender, nondistended. No masses. Positive bowel sounds. No hepatosplenomegaly.  MUSCULOSKELETAL: No swelling, clubbing, or edema. Range of motion full in all extremities.  NEUROLOGIC: Cranial nerves II through XII are intact. No gross focal neurological deficits. Sensation intact. Reflexes intact.  SKIN: No ulceration, lesions, rashes, or cyanosis. Skin warm and dry. Turgor intact.  PSYCHIATRIC: Mood, affect within normal limits. The patient is awake, alert and oriented x 3. Insight, judgment intact.  IMAGING    CT Angio Chest PE W and/or Wo Contrast  Result Date: 04/18/2022 CLINICAL DATA:  PE suspected, shortness of breath EXAM: CT ANGIOGRAPHY CHEST WITH CONTRAST TECHNIQUE: Multidetector CT imaging of the chest was performed using the standard protocol during bolus administration of intravenous contrast. Multiplanar CT image reconstructions and MIPs were obtained to evaluate the vascular anatomy. RADIATION DOSE REDUCTION: This exam was performed according to the departmental dose-optimization program which includes automated exposure control, adjustment of the mA and/or kV according to patient size and/or use of iterative reconstruction technique. CONTRAST:  33m OMNIPAQUE IOHEXOL 350 MG/ML SOLN COMPARISON:  01/14/2022 FINDINGS: Cardiovascular: Satisfactory opacification of the pulmonary arteries to the segmental level. No evidence of pulmonary embolism. Cardiomegaly. Three-vessel coronary artery calcifications. No pericardial effusion. Aortic atherosclerosis. Mediastinum/Nodes: Numerous enlarged mediastinal and hilar lymph nodes, not significantly changed compared to prior examination. Subcarinal lymph node measures up to 2.2 x 2.0 cm (series 5, image 58). Thyroid gland, trachea, and esophagus demonstrate no significant  findings. Lungs/Pleura: Severe emphysema. Diffuse bilateral bronchial wall thickening and extensive bronchiolar plugging in the lower lobes. Interlobular septal thickening throughout the lungs with small bilateral pleural effusions. Upper Abdomen: Please see separately reported examination of the abdomen and pelvis. Musculoskeletal: No chest wall abnormality. No acute osseous findings. Review of the MIP images confirms the above findings. IMPRESSION: 1. Negative examination for pulmonary embolism. 2. Severe emphysema. 3. Diffuse bilateral bronchial wall thickening and extensive bronchiolar plugging in the lower lobes. 4. Interlobular septal thickening throughout the lungs with small bilateral pleural effusions, consistent with pulmonary edema. 5. Numerous enlarged mediastinal and hilar lymph nodes, not significantly changed compared to prior examination and likely reactive. 6. Cardiomegaly and coronary artery disease. Aortic Atherosclerosis (ICD10-I70.0) and Emphysema (ICD10-J43.9). Electronically Signed   By: ADelanna AhmadiM.D.   On: 04/18/2022 18:19   CT ABDOMEN PELVIS W CONTRAST  Result Date: 04/18/2022 CLINICAL DATA:  Abdominal pain, shortness of breath EXAM: CT ABDOMEN AND PELVIS WITH CONTRAST TECHNIQUE: Multidetector CT imaging of the abdomen and pelvis was performed using the standard protocol following bolus administration of intravenous contrast. RADIATION DOSE REDUCTION: This exam was performed according to the departmental dose-optimization program which includes automated exposure control, adjustment of the mA and/or kV according to patient size and/or use of iterative reconstruction technique. CONTRAST:  773mOMNIPAQUE IOHEXOL 350 MG/ML SOLN COMPARISON:  01/14/2022 FINDINGS: Lower chest: Please see separately reported examination of the chest. Hepatobiliary: No focal liver abnormality is seen. Status post cholecystectomy. No biliary dilatation. Pancreas: Unremarkable. No pancreatic ductal dilatation  or surrounding inflammatory changes. Spleen: Normal in size without significant abnormality. Adrenals/Urinary Tract: Adrenal glands are unremarkable. Renal vascular calcifications without evidence of calculi. Kidneys are otherwise normal, without renal calculi, solid lesion, or hydronephrosis. Bladder is unremarkable. Stomach/Bowel: Stomach is within normal limits. Appendix appears normal. No evidence of bowel wall thickening, distention, or inflammatory changes. Vascular/Lymphatic: Aortic atherosclerosis. No enlarged abdominal or pelvic lymph nodes. Reproductive: Status post hysterectomy. Other: No abdominal wall hernia.  Anasarca.  No ascites. Musculoskeletal: No acute or significant osseous findings. IMPRESSION: 1. No acute CT findings of the abdomen or pelvis to explain abdominal pain. 2. Anasarca. 3. Status post cholecystectomy and hysterectomy. Aortic Atherosclerosis (ICD10-I70.0). Electronically Signed   By: AlDelanna Ahmadi.D.   On: 04/18/2022 18:11   DG Chest Port 1 View  Result Date: 04/18/2022 CLINICAL DATA:  Shortness of breath. EXAM: PORTABLE CHEST 1 VIEW COMPARISON:  None Available. FINDINGS: AP portable view of the chest demonstrates diffuse  interstitial lung markings with patchy parenchymal lung densities particularly in the right lung. Cannot exclude pulmonary nodules in the right mid lung region. Heart size is normal. Atherosclerotic calcifications at the aortic arch. Blunting at the costophrenic angles are suspicious for pleural effusions. Negative for a pneumothorax. Probable scarring at the lung apices. IMPRESSION: 1. Patchy parenchymal lung densities particularly in the right lung. Findings are concerning for multifocal pneumonia. 2. Cannot exclude pulmonary nodules in the right mid lung region. Recommend further characterization with a chest CT with IV contrast. 3. Probable small bilateral pleural effusions. 4. Diffuse interstitial lung markings. Electronically Signed   By: Markus Daft M.D.    On: 04/18/2022 15:32      ASSESSMENT/PLAN   Advanced copd. DNR. Here with an exacerbation. Clinically improved. Ok to go home Oxygen 24/7 at 2 liters Albuterol 2 puff qid or via neb Trelegy one puff q day Daliresp 250 mg q day See [place] in pulmonary in one week with cxr Add physical tx at home   There are signs of possible pneumonia,  Ceftin 250 mg bid x 7 days,, one day of zpak)    Also she has mild pulmonary edema, clinically improved Diuresis Follow renal status and lytes   Anemia, nl MCH, appears due to chronic illness following         Thank you for allowing me to participate in the care of this patient.   Patient/Family are satisfied with care plan and all questions have been answered.  This document was prepared using Dragon voice recognition software and may include unintentional dictation errors.     Wallene Huh, M.D.  Division of [facility]

## 2022-04-21 NOTE — Discharge Summary (Addendum)
Physician Discharge Summary   Kristi Clark  female DOB: [birth year]  [id number]  PCP: [name], DO  Admit date: 04/18/2022 Discharge date: 04/21/2022  Admitted From: home Disposition:  home Home Health: Yes CODE STATUS: DNR  Discharge Instructions     Discharge instructions   Complete by: As directed    You received steroid and IV abx for your COPD flare up.  Please finish 7 more days of cefUROXime after discharge.    Please follow up with Dr. Raul Del 1 week after discharge.   Dr. Enzo Bi [facility] Course:  For full details, please see H&P, progress notes, consult notes and ancillary notes.  Briefly,  Kristi Clark is a 72 y.o. 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    Acute on chronic hypoxic respiratory failure  secondary to severe emphysema/COPD exacerbation with bronchopneumonia Acute on chronic diastolic congestive heart failure -- patient chronically on 3 L nasal cannula oxygen -- she was noted to be hypoxic at 79% on 3 L, needed up to 5 L, however, was back to 3L home O2 prior to discharge.   severe emphysema/COPD exacerbation with bronchopneumonia --pulm consulted with Dr. Raul Del --finished 4 days of azithromycin --completed steroid burst. --received Mucomyst neb and DuoNeb for mucus clearance --received 3 days of ceftriaxone and discharged on 7 more days of ceftin, per pulm rec --cont home Trelegy --outpatient f/u with Dr. Raul Del 1 week after discharge.   Acute on chronic diastolic congestive heart failure --received  IV lasix 40 mg daily while inpatient.   Severe Sepsis due to bronchopneumonia -- noted to be tachypnea tachycardic with elevated white count and abnormal chest x-ray.  With acute respiratory failure. -- Blood cultures negative -- continue antibiotics as above    Hypokalemia --monitored and repleted PRN   Afib with RVR in the setting of sepsis and chf  --patient is back in sinus rhythm --cont metop and amiodarone   CAD --stable --cont ASA and statin   Gen weakness/deconditioning --PT   Discharge Diagnoses:  Principal Problem:   Acute on chronic hypoxic respiratory failure (HCC) Active Problems:   Sepsis due to pneumonia ([place])   Acute on chronic diastolic CHF (congestive heart failure) (HCC)   Atrial fibrillation with RVR (HCC)   Hypokalemia   Malnutrition of moderate degree   30 Day Unplanned Readmission Risk Score    Flowsheet Row ED to Hosp-Admission (Current) from 04/18/2022 in [facility]  30 Day Unplanned Readmission Risk Score (%) 24.91 Filed at 04/21/2022 1200       This score is the patient's risk of an unplanned readmission within 30 days of being discharged (0 -100%). The score is based on dignosis, age, lab data, medications, orders, and past utilization.   Low:  0-14.9   Medium: 15-21.9   High: 22-29.9   Extreme: 30 and above         Discharge Instructions:  Allergies as of 04/21/2022   Not on File      Medication List     TAKE these medications    albuterol (2.5 MG/3ML) 0.083% nebulizer solution  Commonly known as: PROVENTIL Take 2.5 mg by nebulization every 4 (four) hours as needed for wheezing or shortness of breath.   amiodarone 200 MG tablet Commonly known as: PACERONE Take 200 mg by mouth daily.   amLODipine 2.5 MG tablet Commonly known as: NORVASC Take 2.5 mg by mouth daily.   aspirin EC 81 MG tablet Take 81 mg by mouth daily at 12 noon. Swallow whole.   atorvastatin 40 MG tablet Commonly known as:  LIPITOR Take 40 mg by mouth daily.   AYR SALINE NASAL GEL NA Place 1 Application into the nose as needed (dry nares).   cefUROXime 500 MG tablet Commonly known as: CEFTIN Take 1 tablet (500 mg total) by mouth 2 (two) times daily with a meal for 7 days. Start taking on: April 22, 2022   cetirizine 10 MG tablet Commonly known as: ZYRTEC Take 10 mg by mouth daily.   clopidogrel 75 MG tablet Commonly known as: PLAVIX Take 75 mg by mouth daily.   feeding supplement Liqd Take 237 mLs by mouth 2 (two) times daily between meals.   ferrous sulfate 325 (65 FE) MG EC tablet Take 325 mg by mouth 3 (three) times daily with meals.   hydrALAZINE 25 MG tablet Commonly known as: APRESOLINE Take 12.5 mg by mouth in the morning and at bedtime.   hydrochlorothiazide 12.5 MG capsule Commonly known as: MICROZIDE Take 12.5 mg by mouth daily as needed (fluid retention).   isosorbide mononitrate 60 MG 24 hr tablet Commonly known as: IMDUR Take 60 mg by mouth daily.   losartan 25 MG tablet Commonly known as: COZAAR Take 25 mg by mouth daily at 12 noon.   metoprolol succinate 50 MG 24 hr tablet Commonly known as: TOPROL-XL Take 50 mg by mouth daily. Take with or immediately following a meal.   mirtazapine 45 MG disintegrating tablet Commonly known as: REMERON SOL-TAB Take 45 mg by mouth at bedtime.   multivitamin with minerals Tabs tablet Take 1 tablet by mouth daily. Start taking on: April 22, 2022   pantoprazole 40 MG tablet Commonly known as: PROTONIX Take 40 mg by mouth 2 (two) times daily before a meal.   polyethylene glycol 17 g packet Commonly known as: MIRALAX / GLYCOLAX Take 17 g by mouth daily as needed for moderate constipation.   QUEtiapine 50 MG Tb24 24 hr tablet Commonly known as: SEROQUEL XR Take 100 mg by mouth at bedtime.   Trelegy Ellipta 100-62.5-25 MCG/ACT Aepb Generic drug: Fluticasone-Umeclidin-Vilant Inhale 1 puff into the lungs daily.                Durable Medical Equipment  (From admission, onward)           Start     Ordered   04/21/22 1043  For home use only DME Walker rolling  Once       Question Answer Comment  Walker: With 5 Inch Wheels   Patient needs a walker to treat with the following condition Weakness      04/21/22 1042             Follow-up Information     Erby Pian, MD Follow up in 1 week(s).   Specialty: Specialist Contact information: [place] [id number] [phone]                 Not on File   The results of significant diagnostics from this hospitalization (including imaging, microbiology, ancillary and laboratory) are listed  below for reference.   Consultations:   Procedures/Studies: CT Angio Chest PE W and/or Wo Contrast  Result Date: 04/18/2022 CLINICAL DATA:  PE suspected, shortness of breath EXAM: CT ANGIOGRAPHY CHEST WITH CONTRAST TECHNIQUE: Multidetector CT imaging of the chest was performed using the standard protocol during bolus administration of intravenous contrast. Multiplanar CT image reconstructions and MIPs were obtained to evaluate the vascular anatomy. RADIATION DOSE REDUCTION: This exam was performed according to the departmental dose-optimization program which includes automated exposure control, adjustment of the mA and/or kV according to patient size and/or use of iterative reconstruction technique. CONTRAST:  7m OMNIPAQUE IOHEXOL 350 MG/ML SOLN COMPARISON:  01/14/2022 FINDINGS: Cardiovascular: Satisfactory opacification of the pulmonary arteries to the segmental level. No evidence of pulmonary embolism. Cardiomegaly. Three-vessel coronary artery calcifications. No pericardial effusion. Aortic atherosclerosis. Mediastinum/Nodes: Numerous enlarged mediastinal and hilar lymph nodes, not significantly changed compared to prior examination. Subcarinal lymph node measures up to 2.2 x 2.0 cm (series 5, image 58). Thyroid gland,  trachea, and esophagus demonstrate no significant findings. Lungs/Pleura: Severe emphysema. Diffuse bilateral bronchial wall thickening and extensive bronchiolar plugging in the lower lobes. Interlobular septal thickening throughout the lungs with small bilateral pleural effusions. Upper Abdomen: Please see separately reported examination of the abdomen and pelvis. Musculoskeletal: No chest wall abnormality. No acute osseous findings. Review of the MIP images confirms the above findings. IMPRESSION: 1. Negative examination for pulmonary embolism. 2. Severe emphysema. 3. Diffuse bilateral bronchial wall thickening and extensive bronchiolar plugging in the lower lobes. 4. Interlobular septal thickening throughout the lungs with small bilateral pleural effusions, consistent with pulmonary edema. 5. Numerous enlarged mediastinal and hilar lymph nodes, not significantly changed compared to prior examination and likely reactive. 6. Cardiomegaly and coronary artery disease. Aortic Atherosclerosis (ICD10-I70.0) and Emphysema (ICD10-J43.9). Electronically Signed   By: ADelanna AhmadiM.D.   On: 04/18/2022 18:19   CT ABDOMEN PELVIS W CONTRAST  Result Date: 04/18/2022 CLINICAL DATA:  Abdominal pain, shortness of breath EXAM: CT ABDOMEN AND PELVIS WITH CONTRAST TECHNIQUE: Multidetector CT imaging of the abdomen and pelvis was performed using the standard protocol following bolus administration of intravenous contrast. RADIATION DOSE REDUCTION: This exam was performed according to the departmental dose-optimization program which includes automated exposure control, adjustment of the mA and/or kV according to patient size and/or use of iterative reconstruction technique. CONTRAST:  753mOMNIPAQUE IOHEXOL 350 MG/ML SOLN COMPARISON:  01/14/2022 FINDINGS: Lower chest: Please see separately reported examination of the chest. Hepatobiliary: No focal liver abnormality is seen. Status post cholecystectomy. No biliary dilatation.  Pancreas: Unremarkable. No pancreatic ductal dilatation or surrounding inflammatory changes. Spleen: Normal in size without significant abnormality. Adrenals/Urinary Tract: Adrenal glands are unremarkable. Renal vascular calcifications without evidence of calculi. Kidneys are otherwise normal, without renal calculi, solid lesion, or hydronephrosis. Bladder is unremarkable. Stomach/Bowel: Stomach is within normal limits. Appendix appears normal. No evidence of bowel wall thickening, distention, or inflammatory changes. Vascular/Lymphatic: Aortic atherosclerosis. No enlarged abdominal or pelvic lymph nodes. Reproductive: Status post hysterectomy. Other: No abdominal wall hernia.  Anasarca.  No ascites. Musculoskeletal: No acute or significant osseous findings. IMPRESSION: 1. No acute CT findings of the abdomen or pelvis to explain abdominal pain. 2. Anasarca. 3. Status post cholecystectomy and hysterectomy. Aortic Atherosclerosis (ICD10-I70.0). Electronically Signed   By: AlDelanna Ahmadi.D.   On: 04/18/2022 18:11   DG Chest Port 1 View  Result Date: 04/18/2022 CLINICAL DATA:  Shortness of breath. EXAM: PORTABLE CHEST 1 VIEW COMPARISON:  None Available. FINDINGS: AP portable view  of the chest demonstrates diffuse interstitial lung markings with patchy parenchymal lung densities particularly in the right lung. Cannot exclude pulmonary nodules in the right mid lung region. Heart size is normal. Atherosclerotic calcifications at the aortic arch. Blunting at the costophrenic angles are suspicious for pleural effusions. Negative for a pneumothorax. Probable scarring at the lung apices. IMPRESSION: 1. Patchy parenchymal lung densities particularly in the right lung. Findings are concerning for multifocal pneumonia. 2. Cannot exclude pulmonary nodules in the right mid lung region. Recommend further characterization with a chest CT with IV contrast. 3. Probable small bilateral pleural effusions. 4. Diffuse interstitial  lung markings. Electronically Signed   By: Markus Daft M.D.   On: 04/18/2022 15:32      Labs: BNP (last 3 results) Recent Labs    04/18/22 1504  BNP [id number]*   Basic Metabolic Panel: Recent Labs  Lab 04/18/22 1504 04/18/22 1511 04/19/22 0422 04/21/22 0355  NA 132*  --  134* 133*  K 3.4*  --  4.2 4.7  CL 90*  --  92* 92*  CO2 30  --  30 33*  GLUCOSE 109*  --  112* 140*  BUN 16  --  17 25*  CREATININE 0.72  --  0.74 0.70  CALCIUM 9.2  --  8.7* 8.8*  MG  --  2.1  --  2.0   Liver Function Tests: Recent Labs  Lab 04/18/22 1504  AST 83*  ALT 77*  ALKPHOS 113  BILITOT 0.7  PROT 7.3  ALBUMIN 2.9*   No results for input(s): "LIPASE", "AMYLASE" in the last 168 hours. No results for input(s): "AMMONIA" in the last 168 hours. CBC: Recent Labs  Lab 04/18/22 1504 04/19/22 0422 04/21/22 0355  WBC 14.0* 11.1* 10.5  HGB 9.3* 8.3* 7.9*  HCT 28.7* 25.1* 24.7*  MCV 93.8 92.6 94.3  PLT 318 317 354   Cardiac Enzymes: No results for input(s): "CKTOTAL", "CKMB", "CKMBINDEX", "TROPONINI" in the last 168 hours. BNP: Invalid input(s): "POCBNP" CBG: No results for input(s): "GLUCAP" in the last 168 hours. D-Dimer No results for input(s): "DDIMER" in the last 72 hours. Hgb A1c No results for input(s): "HGBA1C" in the last 72 hours. Lipid Profile No results for input(s): "CHOL", "HDL", "LDLCALC", "TRIG", "CHOLHDL", "LDLDIRECT" in the last 72 hours. Thyroid function studies No results for input(s): "TSH", "T4TOTAL", "T3FREE", "THYROIDAB" in the last 72 hours.  Invalid input(s): "FREET3" Anemia work up No results for input(s): "VITAMINB12", "FOLATE", "FERRITIN", "TIBC", "IRON", "RETICCTPCT" in the last 72 hours. Urinalysis    Component Value Date/Time   COLORURINE YELLOW (A) 04/18/2022 1537   APPEARANCEUR CLEAR (A) 04/18/2022 1537   LABSPEC 1.017 04/18/2022 1537   PHURINE 6.0 04/18/2022 1537   GLUCOSEU NEGATIVE 04/18/2022 1537   HGBUR NEGATIVE 04/18/2022 1537    BILIRUBINUR NEGATIVE 04/18/2022 1537   KETONESUR NEGATIVE 04/18/2022 1537   PROTEINUR 30 (A) 04/18/2022 1537   NITRITE NEGATIVE 04/18/2022 1537   LEUKOCYTESUR NEGATIVE 04/18/2022 1537   Sepsis Labs Recent Labs  Lab 04/18/22 1504 04/19/22 0422 04/21/22 0355  WBC 14.0* 11.1* 10.5   Microbiology Recent Results (from the past 240 hour(s))  Resp panel by RT-PCR (RSV, Flu A&B, Covid) Anterior Nasal Swab     Status: None   Collection Time: 04/18/22  3:55 PM   Specimen: Anterior Nasal Swab  Result Value Ref Range Status   SARS Coronavirus 2 by RT PCR NEGATIVE NEGATIVE Final    Comment: (NOTE) SARS-CoV-2 target nucleic acids are NOT DETECTED.  The  SARS-CoV-2 RNA is generally detectable in upper respiratory specimens during the acute phase of infection. The lowest concentration of SARS-CoV-2 viral copies this assay can detect is 138 copies/mL. A negative result does not preclude SARS-Cov-2 infection and should not be used as the sole basis for treatment or other patient management decisions. A negative result may occur with  improper specimen collection/handling, submission of specimen other than nasopharyngeal swab, presence of viral mutation(s) within the areas targeted by this assay, and inadequate number of viral copies(<138 copies/mL). A negative result must be combined with clinical observations, patient history, and epidemiological information. The expected result is Negative.  Fact Sheet for Patients:  EntrepreneurPulse.com.au  Fact Sheet for Healthcare Providers:  IncredibleEmployment.be  This test is no t yet approved or cleared by the [place] FDA and  has been authorized for detection and/or diagnosis of SARS-CoV-2 by FDA under an Emergency Use Authorization (EUA). This EUA will remain  in effect (meaning this test can be used) for the duration of the COVID-19 declaration under Section 564(b)(1) of the Act, 21 U.S.C.section  360bbb-3(b)(1), unless the authorization is terminated  or revoked sooner.       Influenza A by PCR NEGATIVE NEGATIVE Final   Influenza B by PCR NEGATIVE NEGATIVE Final    Comment: (NOTE) The Xpert Xpress SARS-CoV-2/FLU/RSV plus assay is intended as an aid in the diagnosis of influenza from Nasopharyngeal swab specimens and should not be used as a sole basis for treatment. Nasal washings and aspirates are unacceptable for Xpert Xpress SARS-CoV-2/FLU/RSV testing.  Fact Sheet for Patients: EntrepreneurPulse.com.au  Fact Sheet for Healthcare Providers: IncredibleEmployment.be  This test is not yet approved or cleared by the [place] FDA and has been authorized for detection and/or diagnosis of SARS-CoV-2 by FDA under an Emergency Use Authorization (EUA). This EUA will remain in effect (meaning this test can be used) for the duration of the COVID-19 declaration under Section 564(b)(1) of the Act, 21 U.S.C. section 360bbb-3(b)(1), unless the authorization is terminated or revoked.     Resp Syncytial Virus by PCR NEGATIVE NEGATIVE Final    Comment: (NOTE) Fact Sheet for Patients: EntrepreneurPulse.com.au  Fact Sheet for Healthcare Providers: IncredibleEmployment.be  This test is not yet approved or cleared by the [place] FDA and has been authorized for detection and/or diagnosis of SARS-CoV-2 by FDA under an Emergency Use Authorization (EUA). This EUA will remain in effect (meaning this test can be used) for the duration of the COVID-19 declaration under Section 564(b)(1) of the Act, 21 U.S.C. section 360bbb-3(b)(1), unless the authorization is terminated or revoked.  Performed at [facility], [address] [place], [place] [address]   Culture, blood (Routine X 2) w Reflex to ID Panel     Status: None (Preliminary result)   Collection Time: 04/18/22  4:15 PM   Specimen: BLOOD   Result Value Ref Range Status   Specimen Description BLOOD RIGHT ARM  Final   Special Requests   Final    BOTTLES DRAWN AEROBIC AND ANAEROBIC Blood Culture results may not be optimal due to an inadequate volume of blood received in culture bottles   Culture   Final    NO GROWTH 3 DAYS Performed at [facility], [address], [place], [place] [address]    Report Status PENDING  Incomplete  Culture, blood (Routine X 2) w Reflex to ID Panel     Status: None (Preliminary result)   Collection Time: 04/18/22  9:42 PM   Specimen: BLOOD  Result Value Ref Range Status   Specimen Description BLOOD  RIGHT ARM  Final   Special Requests   Final    BOTTLES DRAWN AEROBIC ONLY Blood Culture adequate volume   Culture   Final    NO GROWTH 3 DAYS Performed at [facility], [address] [place], [place] [address]    Report Status PENDING  Incomplete     Total time spend on discharging this patient, including the last patient exam, discussing the hospital stay, instructions for ongoing care as it relates to all pertinent caregivers, as well as preparing the medical discharge records, prescriptions, and/or referrals as applicable, is 40 minutes.    Enzo Bi, MD  [facility] 04/21/2022, 3:32 PM

## 2022-04-22 ENCOUNTER — Encounter: Payer: Self-pay | Admitting: Family Medicine

## 2022-04-22 ENCOUNTER — Telehealth: Payer: Self-pay | Admitting: *Deleted

## 2022-04-22 NOTE — Transitions of Care (Post Inpatient/ED Visit) (Signed)
   04/22/2022  Name: Kristi Clark MRN: [id number] DOB: 01-02-1951  Today's TOC FU Call Status: Today's TOC FU Call Status:: Successful TOC FU Call Competed TOC FU Call Complete Date: 04/22/22  Transition Care Management Follow-up Telephone Call Date of Discharge: 04/21/22 Discharge Facility: [facility] Type of Discharge: Inpatient Admission Primary Inpatient Discharge Diagnosis:: Resp failure How have you been since you were released from the hospital?: Better Any questions or concerns?: Yes Patient Questions/Concerns:: [place] agency number Patient Questions/Concerns Addressed: Other: (RN gave the patient the phone number for [facility] agency)  Items Reviewed: Did you receive and understand the discharge instructions provided?: Yes Medications obtained and verified?: Yes (Medications Reviewed) Any new allergies since your discharge?: No Dietary orders reviewed?: No Do you have support at home?: Yes People in Home: child(ren), adult Name of Support/Comfort Primary Source: [employer] and Equipment/Supplies: [place] Ordered?: Yes Name of [place]:: [place] set up a time to come to your home?: No EMR reviewed for [place] Orders: Orders present/patient has not received call (refer to CM for follow-up) (RN gave daughter number to call) Any new equipment or medical supplies ordered?: Yes (walker) Name of Medical supply agency?: [employer] Were you able to get the equipment/medical supplies?: Yes  Functional Questionnaire: Do you need assistance with bathing/showering or dressing?: No Do you need assistance with meal preparation?: No Do you need assistance with eating?: No Do you have difficulty maintaining continence: No Do you need assistance with getting out of bed/getting out of a chair/moving?: No Do you have difficulty managing or taking your medications?: No  Folllow up appointments reviewed: PCP Follow-up appointment confirmed?:  Yes Date of PCP follow-up appointment?: 05/03/22 Follow-up Provider: [facility] Follow-up appointment confirmed?: Yes Date of Specialist follow-up appointment?: 05/04/22 Follow-Up Specialty Provider:: [name] Do you need transportation to your follow-up appointment?: No Do you understand care options if your condition(s) worsen?: Yes-patient verbalized understanding (Daughter understands)  SDOH Interventions Today    Flowsheet Row Most Recent Value  SDOH Interventions   Food Insecurity Interventions Intervention Not Indicated  Housing Interventions Intervention Not Indicated  Transportation Interventions Intervention Not Indicated          [name] [phone]

## 2022-04-23 LAB — CULTURE, BLOOD (ROUTINE X 2)
Culture: NO GROWTH
Culture: NO GROWTH
Special Requests: ADEQUATE

## 2022-04-26 ENCOUNTER — Telehealth: Payer: Self-pay | Admitting: *Deleted

## 2022-04-26 NOTE — Patient Outreach (Signed)
  Care Coordination   Follow Up Visit Note   04/26/2022 Name: Kristi Clark MRN: [id number] DOB: 1950/12/20  Kristi Clark is a 72 y.o. year old female who sees [name], DO for primary care. I spoke with  Kristi Clark, daughter of Kristi Clark by phone today.  What matters to the patients health and wellness today?  Hospitalized from 2/12-2/15 with chronic respiratory failure. Eager to restart [facility] services.   Goals Addressed             This Visit's Progress    Effective management of heart conditions   Not on track    Care Coordination Interventions: Counseled on increased risk of stroke due to Afib and benefits of anticoagulation for stroke prevention Afib action plan reviewed Assessed social determinant of health barriers Evaluation of current treatment plan related to hypertension self management and patient's adherence to plan as established by provider Reviewed medications with patient and discussed importance of compliance Discussed plans with patient for ongoing care management follow up and provided patient with direct contact information for care management team Reviewed scheduled/upcoming provider appointments including:  Discussed complications of poorly controlled blood pressure such as heart disease, stroke, circulatory complications, vision complications, kidney impairment, sexual dysfunction Discussed recent hospitalization, per daughter, patient is "fair."  Still having to use oxygen, no current shortness of breath  Has intermittent shortness of breath, on oxygen at home.  State she feels she's not able to do anything for herself   Confirmed [facility] has started for PT/OT and nursing.  Call placed to [facility] as patient said she had not restarted services, notified that they are reaching out to provider to confirm orders and will family directly.  Notified [facility] that patient is now at daughter's home, address provided.  Encouraged to monitor BP and HR  daily, recording readings to share with provider.  Appointments with PCP tomorrow, pulmonology on 2/23, and HF clinic on 2/28          SDOH assessments and interventions completed:  No     Care Coordination Interventions:  Yes, provided   Follow up plan: Follow up call scheduled for 2/29    Encounter Outcome:  Pt. Visit Completed   Valente David, RN, MSN, [place] Care Management Care Management Coordinator [phone]

## 2022-04-26 NOTE — Patient Instructions (Signed)
Visit Information  Thank you for taking time to visit with me today. Please don't hesitate to contact me if I can be of assistance to you before our next scheduled telephone appointment.  Following are the goals we discussed today:  Continue monitoring of BP, HR, and weights if possible.  Listen for call from [facility] for [facility] visit.   Our next appointment is by telephone on 2/29  Please call the care guide team at [phone] if you need to cancel or reschedule your appointment.   Please call the Suicide and Crisis Lifeline: 988 call the [place] National Suicide Prevention Lifeline: (516)653-3232 or TTY: (807) 383-0955 TTY [phone]) to talk to a trained counselor call 1-800-273-TALK (toll free, 24 hour hotline) call 911 if you are experiencing a Mental Health or [place] or need someone to talk to.  Patient verbalizes understanding of instructions and care plan provided today and agrees to view in Start. Active MyChart status and patient understanding of how to access instructions and care plan via MyChart confirmed with patient.     The patient has been provided with contact information for the care management team and has been advised to call with any health related questions or concerns.   Valente David, RN, MSN, [facility] Care Management Coordinator [phone]

## 2022-04-27 ENCOUNTER — Encounter: Payer: Self-pay | Admitting: Family Medicine

## 2022-04-27 ENCOUNTER — Ambulatory Visit (INDEPENDENT_AMBULATORY_CARE_PROVIDER_SITE_OTHER): Payer: Medicare Other | Admitting: Family Medicine

## 2022-04-27 ENCOUNTER — Inpatient Hospital Stay: Payer: Medicare Other | Admitting: Family Medicine

## 2022-04-27 VITALS — BP 159/54 | HR 63 | Temp 98.3°F

## 2022-04-27 DIAGNOSIS — J449 Chronic obstructive pulmonary disease, unspecified: Secondary | ICD-10-CM | POA: Diagnosis not present

## 2022-04-27 MED ORDER — MUPIROCIN 2 % EX OINT: 1.0000 | TOPICAL_OINTMENT | Freq: Two times a day (BID) | CUTANEOUS | 0 refills | Status: DC

## 2022-04-27 NOTE — Progress Notes (Signed)
BP (!) 159/54 (BP Location: Left Arm, Cuff Size: Normal)   Pulse 63   Temp 98.3 F (36.8 C) (Oral)   LMP  (LMP Unknown)   SpO2 99%    Subjective:    Patient ID: Kristi Clark, female    DOB: [birth year], 72 y.o.   MRN: [id number]  HPI: Kristi Clark is a 72 y.o. female  Chief Complaint  Patient presents with   Hospitalization Follow-up   Transition of [facility] Follow up.   Hospital/Facility: [facility] D/C Physician: Dr. Billie Ruddy D/C Date: 04/21/22  Records Requested: 04/27/22 Records Received: 04/27/22 Records Reviewed: 04/27/22  Diagnoses on Discharge: Acute on chronic hypoxic respiratory failure ([place])   Sepsis due to pneumonia [facility])   Acute on chronic diastolic CHF (congestive heart failure) ([place])   Atrial fibrillation with RVR ([facility])   Hypokalemia   Malnutrition of moderate degree  Date of interactive Contact within 48 hours of discharge: 04/22/22 Contact was through: phone  Date of 7 day or 14 day face-to-face visit:  04/27/22  within 7 days  Outpatient Encounter Medications as of 04/27/2022  Medication Sig Note   albuterol (PROVENTIL) (2.5 MG/3ML) 0.083% nebulizer solution Take 3 mLs (2.5 mg total) by nebulization every 4 (four) hours as needed for wheezing or shortness of breath.    albuterol (VENTOLIN HFA) 108 (90 Base) MCG/ACT inhaler Inhale 2 puffs into the lungs every 6 (six) hours as needed for wheezing or shortness of breath.    Aloe-Sodium Chloride (AYR SALINE NASAL GEL NA) Place 1 Application into the nose as needed (dry nares).    amLODipine (NORVASC) 2.5 MG tablet Take 1 tablet (2.5 mg total) by mouth daily.    aspirin 81 MG tablet Take 81 mg by mouth daily.    atorvastatin (LIPITOR) 40 MG tablet Take 1 tablet (40 mg total) by mouth daily.    Blood Pressure Monitoring (BLOOD PRESSURE MONITOR/WRIST) KIT Take Blood pressure as needed, Dx: I12.9    cefUROXime (CEFTIN) 500 MG tablet Take 1 tablet (500 mg total) by mouth 2 (two) times daily with a meal for 7  days.    cetirizine (ZYRTEC) 10 MG tablet Take 10 mg by mouth daily.    clopidogrel (PLAVIX) 75 MG tablet Take 1 tablet (75 mg total) by mouth daily.    feeding supplement (ENSURE ENLIVE / ENSURE PLUS) LIQD Take 237 mLs by mouth 2 (two) times daily between meals.    ferrous sulfate 325 (65 FE) MG tablet Take 1 tablet (325 mg total) by mouth daily with breakfast.    Fluticasone-Umeclidin-Vilant (TRELEGY ELLIPTA) 100-62.5-25 MCG/ACT AEPB 100 mcg DAILY (route: inhalation) 04/15/2022: Med Classification: Respiratory Therapy Agents   furosemide (LASIX) 20 MG tablet Take 1 tablet (20 mg total) by mouth daily. 03/04/2022: New medication, not started yet   hydrALAZINE (APRESOLINE) 25 MG tablet Take 0.5 tablets (12.5 mg total) by mouth 2 (two) times daily.    hydrochlorothiazide (MICROZIDE) 12.5 MG capsule Take 1 capsule (12.5 mg total) by mouth daily as needed.    isosorbide mononitrate (IMDUR) 60 MG 24 hr tablet Take 1 tablet (60 mg total) by mouth 2 (two) times daily.    losartan (COZAAR) 25 MG tablet Take 1 tablet (25 mg total) by mouth daily.    metoprolol succinate (TOPROL-XL) 50 MG 24 hr tablet Take 1 tablet (50 mg total) by mouth daily. Take with or immediately following a meal.    mirtazapine (REMERON) 45 MG tablet Take 1 tablet (45 mg total) by mouth  at bedtime.    Multiple Vitamin (MULTIVITAMIN WITH MINERALS) TABS tablet Take 1 tablet by mouth daily.    Multiple Vitamins-Minerals (CENTRUM SILVER ADULT 50+ PO) Take 1 tablet by mouth daily.    Multiple Vitamins-Minerals (PRESERVISION AREDS 2+MULTI VIT) CAPS Take 1 capsule by mouth at bedtime.    mupirocin ointment (BACTROBAN) 2 % Apply 1 Application topically 2 (two) times daily.    nicotine (NICODERM CQ - DOSED IN MG/24 HOURS) 14 mg/24hr patch Place 1 patch (14 mg total) onto the skin daily.    nystatin (MYCOSTATIN) 100000 UNIT/ML suspension Take 5 mLs (500,000 Units total) by mouth 4 (four) times daily.    pantoprazole (PROTONIX) 40 MG tablet Take  1 tablet (40 mg total) by mouth 2 (two) times daily.    polyethylene glycol powder (GLYCOLAX/MIRALAX) 17 GM/SCOOP powder Take 17 g by mouth 3 (three) times daily.    QUEtiapine (SEROQUEL XR) 50 MG TB24 24 hr tablet Take 2 tablets (100 mg total) by mouth at bedtime.    saline (AYR) GEL Place 1 Application into both nostrils every 4 (four) hours as needed.    [DISCONTINUED] albuterol (PROVENTIL) (2.5 MG/3ML) 0.083% nebulizer solution Take 2.5 mg by nebulization every 4 (four) hours as needed for wheezing or shortness of breath.    [DISCONTINUED] amiodarone (PACERONE) 200 MG tablet Take 200 mg by mouth daily.    [DISCONTINUED] amLODipine (NORVASC) 2.5 MG tablet Take 2.5 mg by mouth daily.    [DISCONTINUED] aspirin EC 81 MG tablet Take 81 mg by mouth daily at 12 noon. Swallow whole.    [DISCONTINUED] atorvastatin (LIPITOR) 40 MG tablet Take 40 mg by mouth daily.    [DISCONTINUED] cetirizine (ZYRTEC) 10 MG tablet Take 10 mg by mouth daily as needed for allergies.    [DISCONTINUED] clopidogrel (PLAVIX) 75 MG tablet Take 75 mg by mouth daily.    [DISCONTINUED] ferrous sulfate 325 (65 FE) MG EC tablet Take 325 mg by mouth 3 (three) times daily with meals.    [DISCONTINUED] Fluticasone-Umeclidin-Vilant (TRELEGY ELLIPTA) 100-62.5-25 MCG/ACT AEPB Inhale 1 puff into the lungs daily.    [DISCONTINUED] hydrALAZINE (APRESOLINE) 25 MG tablet Take 12.5 mg by mouth in the morning and at bedtime.    [DISCONTINUED] hydrochlorothiazide (MICROZIDE) 12.5 MG capsule Take 12.5 mg by mouth daily as needed (fluid retention).    [DISCONTINUED] isosorbide mononitrate (IMDUR) 60 MG 24 hr tablet Take 60 mg by mouth daily.    [DISCONTINUED] losartan (COZAAR) 25 MG tablet Take 25 mg by mouth daily at 12 noon.    [DISCONTINUED] metoprolol succinate (TOPROL-XL) 50 MG 24 hr tablet Take 50 mg by mouth daily. Take with or immediately following a meal.    [DISCONTINUED] mirtazapine (REMERON SOL-TAB) 45 MG disintegrating tablet Take 45  mg by mouth at bedtime.    [DISCONTINUED] pantoprazole (PROTONIX) 40 MG tablet Take 40 mg by mouth 2 (two) times daily before a meal.    [DISCONTINUED] polyethylene glycol (MIRALAX / GLYCOLAX) 17 g packet Take 17 g by mouth daily as needed for moderate constipation.    [DISCONTINUED] QUEtiapine (SEROQUEL XR) 50 MG TB24 24 hr tablet Take 100 mg by mouth at bedtime.    amiodarone (PACERONE) 200 MG tablet Take 1 tablet (200 mg total) by mouth daily.    Facility-Administered Encounter Medications as of 04/27/2022  Medication   sodium chloride flush (NS) 0.9 % injection 3 mL   triamcinolone acetonide (KENALOG-40) injection 40 mg  Per Hospitalist: "Hospital Course:  For full details, please see H&P,  progress notes, consult notes and ancillary notes.  Briefly,  Kristi Clark is a 72 y.o. 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    Acute on chronic hypoxic respiratory failure  secondary to severe emphysema/COPD exacerbation with bronchopneumonia Acute on chronic diastolic congestive heart failure -- patient chronically on 3 L nasal cannula oxygen -- she was noted to be hypoxic at 79% on 3 L, needed up to 5 L, however, was back to 3L home O2 prior to discharge.   severe emphysema/COPD exacerbation with bronchopneumonia --pulm consulted with Dr. Raul Del --finished 4 days of azithromycin --completed steroid burst. --received Mucomyst neb and DuoNeb for mucus clearance --received 3 days of ceftriaxone and discharged on 7 more days of ceftin, per pulm rec --cont home Trelegy --outpatient  f/u with Dr. Raul Del 1 week after discharge.   Acute on chronic diastolic congestive heart failure --received IV lasix 40 mg daily while inpatient.   Severe Sepsis due to bronchopneumonia -- noted to be tachypnea tachycardic with elevated white count and abnormal chest x-ray.  With acute respiratory failure. -- Blood cultures negative -- continue antibiotics as above    Hypokalemia --monitored and repleted PRN   Afib with RVR in the setting of sepsis and chf  --patient is back in sinus rhythm --cont metop and amiodarone   CAD --stable --cont ASA and statin   Gen weakness/deconditioning --PT"  Diagnostic Tests Reviewed: CLINICAL DATA:  Shortness of breath.   EXAM: PORTABLE CHEST 1 VIEW   COMPARISON:  None Available.   FINDINGS: AP portable view of the chest demonstrates diffuse interstitial lung markings with patchy parenchymal lung densities particularly in the right lung. Cannot exclude pulmonary nodules in the right mid lung region. Heart size is normal. Atherosclerotic calcifications at the aortic arch. Blunting at the costophrenic angles are suspicious for pleural effusions. Negative for a pneumothorax. Probable scarring at the lung apices.   IMPRESSION: 1. Patchy parenchymal lung densities particularly in the right lung. Findings are concerning for multifocal pneumonia. 2. Cannot exclude pulmonary nodules in the right mid lung region. Recommend further characterization with a chest CT with IV contrast. 3. Probable small bilateral pleural effusions. 4. Diffuse interstitial lung markings.  Narrative & Impression  CLINICAL DATA:  PE suspected, shortness of breath   EXAM: CT ANGIOGRAPHY CHEST WITH CONTRAST   TECHNIQUE: Multidetector CT imaging of the chest was performed using the standard protocol during bolus administration of intravenous contrast. Multiplanar CT image reconstructions and MIPs were obtained to evaluate the vascular anatomy.   RADIATION  DOSE REDUCTION: This exam was performed according to the departmental dose-optimization program which includes automated exposure control, adjustment of the mA and/or kV according to patient size and/or use of iterative reconstruction technique.   CONTRAST:  40m OMNIPAQUE IOHEXOL 350 MG/ML SOLN   COMPARISON:  01/14/2022   FINDINGS: Cardiovascular: Satisfactory opacification of the pulmonary arteries to the segmental level. No evidence of pulmonary embolism. Cardiomegaly. Three-vessel coronary artery calcifications. No pericardial effusion. Aortic atherosclerosis.   Mediastinum/Nodes: Numerous enlarged mediastinal and hilar lymph nodes, not significantly changed compared to prior examination. Subcarinal lymph node measures up to 2.2 x 2.0 cm (series 5,  image 58). Thyroid gland, trachea, and esophagus demonstrate no significant findings.   Lungs/Pleura: Severe emphysema. Diffuse bilateral bronchial wall thickening and extensive bronchiolar plugging in the lower lobes. Interlobular septal thickening throughout the lungs with small bilateral pleural effusions.   Upper Abdomen: Please see separately reported examination of the abdomen and pelvis.   Musculoskeletal: No chest wall abnormality. No acute osseous findings.   Review of the MIP images confirms the above findings.   IMPRESSION: 1. Negative examination for pulmonary embolism. 2. Severe emphysema. 3. Diffuse bilateral bronchial wall thickening and extensive bronchiolar plugging in the lower lobes. 4. Interlobular septal thickening throughout the lungs with small bilateral pleural effusions, consistent with pulmonary edema. 5. Numerous enlarged mediastinal and hilar lymph nodes, not significantly changed compared to prior examination and likely reactive. 6. Cardiomegaly and coronary artery disease.   Aortic Atherosclerosis (ICD10-I70.0) and Emphysema (ICD10-J43.9).   CLINICAL DATA:  Abdominal pain, shortness of  breath   EXAM: CT ABDOMEN AND PELVIS WITH CONTRAST   TECHNIQUE: Multidetector CT imaging of the abdomen and pelvis was performed using the standard protocol following bolus administration of intravenous contrast.   RADIATION DOSE REDUCTION: This exam was performed according to the departmental dose-optimization program which includes automated exposure control, adjustment of the mA and/or kV according to patient size and/or use of iterative reconstruction technique.   CONTRAST:  41m OMNIPAQUE IOHEXOL 350 MG/ML SOLN   COMPARISON:  01/14/2022   FINDINGS: Lower chest: Please see separately reported examination of the chest.   Hepatobiliary: No focal liver abnormality is seen. Status post cholecystectomy. No biliary dilatation.   Pancreas: Unremarkable. No pancreatic ductal dilatation or surrounding inflammatory changes.   Spleen: Normal in size without significant abnormality.   Adrenals/Urinary Tract: Adrenal glands are unremarkable. Renal vascular calcifications without evidence of calculi. Kidneys are otherwise normal, without renal calculi, solid lesion, or hydronephrosis. Bladder is unremarkable.   Stomach/Bowel: Stomach is within normal limits. Appendix appears normal. No evidence of bowel wall thickening, distention, or inflammatory changes.   Vascular/Lymphatic: Aortic atherosclerosis. No enlarged abdominal or pelvic lymph nodes.   Reproductive: Status post hysterectomy.   Other: No abdominal wall hernia.  Anasarca.  No ascites.   Musculoskeletal: No acute or significant osseous findings.   IMPRESSION: 1. No acute CT findings of the abdomen or pelvis to explain abdominal pain. 2. Anasarca. 3. Status post cholecystectomy and hysterectomy.   Aortic Atherosclerosis (ICD10-I70.0).  Disposition: Home  Consults: Pulmonology  Discharge Instructions: Follow up with [place] Dr. FRaul Del Disease/illness Education: Discussed today  Home Health/Community  Services Discussions/Referrals: in place  Establishment or re-establishment of referral orders for community resources: in Place  Discussion with other health care providers: none  Assessment and Support of treatment regimen adherence: FEngel[place]with: patient and daughter  Education for self-management, independent living, and ADLs: Discussed today  Since getting out of the hospital. PGelisahas been doing OK. She does note that she's been itchy- so she is wondering if she's allergic to the ceftin. She feels like her breathing has been better. Still having some coughing and SOB but not anywhere near as bad as she was before. No other concerns or complaints at this time.   Relevant past medical, surgical, family and social history reviewed and updated as indicated. Interim medical history since our last visit reviewed. Allergies and medications reviewed and updated.  Review of Systems  Constitutional: Negative.   Respiratory:  Positive for cough, chest tightness, shortness of breath and wheezing. Negative for  apnea, choking and stridor.   Cardiovascular: Negative.   Gastrointestinal: Negative.   Musculoskeletal: Negative.   Psychiatric/Behavioral: Negative.      Per HPI unless specifically indicated above     Objective:    BP (!) 159/54 (BP Location: Left Arm, Cuff Size: Normal)   Pulse 63   Temp 98.3 F (36.8 C) (Oral)   LMP  (LMP Unknown)   SpO2 99%   Wt Readings from Last 3 Encounters:  04/20/22 90 lb 2.7 oz (40.9 kg)  04/15/22 94 lb 11.2 oz (43 kg)  04/01/22 92 lb 14.4 oz (42.1 kg)    Physical Exam Vitals and nursing note reviewed.  Constitutional:      General: She is not in acute distress.    Appearance: Normal appearance. She is normal weight. She is not ill-appearing, toxic-appearing or diaphoretic.  HENT:     Head: Normocephalic and atraumatic.     Right Ear: External ear normal.     Left Ear: External ear normal.     Nose: Nose normal.      Mouth/Throat:     Mouth: Mucous membranes are moist.     Pharynx: Oropharynx is clear.  Eyes:     General: No scleral icterus.       Right eye: No discharge.        Left eye: No discharge.     Extraocular Movements: Extraocular movements intact.     Conjunctiva/sclera: Conjunctivae normal.     Pupils: Pupils are equal, round, and reactive to light.  Cardiovascular:     Rate and Rhythm: Normal rate and regular rhythm.     Pulses: Normal pulses.     Heart sounds: Normal heart sounds. No murmur heard.    No friction rub. No gallop.  Pulmonary:     Effort: Pulmonary effort is normal. No respiratory distress.     Breath sounds: Normal breath sounds. No stridor. No wheezing, rhonchi or rales.  Chest:     Chest wall: No tenderness.  Musculoskeletal:        General: Normal range of motion.     Cervical back: Normal range of motion and neck supple.  Skin:    General: Skin is warm and dry.     Capillary Refill: Capillary refill takes less than 2 seconds.     Coloration: Skin is not jaundiced or pale.     Findings: No bruising, erythema, lesion or rash.  Neurological:     General: No focal deficit present.     Mental Status: She is alert and oriented to person, place, and time. Mental status is at baseline.  Psychiatric:        Mood and Affect: Mood normal.        Behavior: Behavior normal.        Thought Content: Thought content normal.        Judgment: Judgment normal.     Results for orders placed or performed during the hospital encounter of 04/18/22  Resp panel by RT-PCR (RSV, Flu A&B, Covid) Anterior Nasal Swab   Specimen: Anterior Nasal Swab  Result Value Ref Range   SARS Coronavirus 2 by RT PCR NEGATIVE NEGATIVE   Influenza A by PCR NEGATIVE NEGATIVE   Influenza B by PCR NEGATIVE NEGATIVE   Resp Syncytial Virus by PCR NEGATIVE NEGATIVE  Culture, blood (Routine X 2) w Reflex to ID Panel   Specimen: BLOOD  Result Value Ref Range   Specimen Description BLOOD RIGHT ARM     Special  Requests      BOTTLES DRAWN AEROBIC AND ANAEROBIC Blood Culture results may not be optimal due to an inadequate volume of blood received in culture bottles   Culture      NO GROWTH 5 DAYS Performed at [facility], [address] [place], [place] [address]    Report Status 04/23/2022 FINAL   Culture, blood (Routine X 2) w Reflex to ID Panel   Specimen: BLOOD  Result Value Ref Range   Specimen Description BLOOD  RIGHT ARM    Special Requests      BOTTLES DRAWN AEROBIC ONLY Blood Culture adequate volume   Culture      NO GROWTH 5 DAYS Performed at [facility], [address] [place], [place] [address]    Report Status 04/23/2022 FINAL   Comprehensive metabolic panel  Result Value Ref Range   Sodium 132 (L) 135 - 145 mmol/L   Potassium 3.4 (L) 3.5 - 5.1 mmol/L   Chloride 90 (L) 98 - 111 mmol/L   CO2 30 22 - 32 mmol/L   Glucose, Bld 109 (H) 70 - 99 mg/dL   BUN 16 8 - 23 mg/dL   Creatinine, Ser 0.72 0.44 - 1.00 mg/dL   Calcium 9.2 8.9 - 10.3 mg/dL   Total Protein 7.3 6.5 - 8.1 g/dL   Albumin 2.9 (L) 3.5 - 5.0 g/dL   AST 83 (H) 15 - 41 U/L   ALT 77 (H) 0 - 44 U/L   Alkaline Phosphatase 113 38 - 126 U/L   Total Bilirubin 0.7 0.3 - 1.2 mg/dL   GFR, Estimated >60 >60 mL/min   Anion gap 12 5 - 15  CBC  Result Value Ref Range   WBC 14.0 (H) 4.0 - 10.5 K/uL   RBC 3.06 (L) 3.87 - 5.11 MIL/uL   Hemoglobin 9.3 (L) 12.0 - 15.0 g/dL   HCT 28.7 (L) 36.0 - 46.0 %   MCV 93.8 80.0 - 100.0 fL   MCH 30.4 26.0 - 34.0 pg   MCHC 32.4 30.0 - 36.0 g/dL   RDW 16.1 (H) 11.5 - 15.5 %   Platelets 318 150 - 400 K/uL   nRBC 0.0 0.0 - 0.2 %  Brain natriuretic peptide  Result Value Ref Range   B Natriuretic Peptide 746.3 (H) 0.0 - 100.0 pg/mL  TSH  Result Value Ref Range   TSH 0.701 0.350 - 4.500 uIU/mL  T4, free  Result Value Ref Range   Free T4 1.31 (H) 0.61 - 1.12 ng/dL  Magnesium  Result Value Ref Range   Magnesium 2.1 1.7 - 2.4 mg/dL  Urinalysis, w/  Reflex to Culture (Infection Suspected) -Urine, Clean Catch  Result Value Ref Range   Specimen Source URINE, RANDOM    Color, Urine YELLOW (A) YELLOW   APPearance CLEAR (A) CLEAR   Specific Gravity, Urine 1.017 1.005 - 1.030   pH 6.0 5.0 - 8.0   Glucose, UA NEGATIVE NEGATIVE mg/dL   Hgb urine dipstick NEGATIVE NEGATIVE   Bilirubin Urine NEGATIVE NEGATIVE   Ketones, ur NEGATIVE NEGATIVE mg/dL   Protein, ur 30 (A) NEGATIVE mg/dL   Nitrite NEGATIVE NEGATIVE   Leukocytes,Ua NEGATIVE NEGATIVE   WBC, UA 0-5 0 - 5 WBC/hpf   Bacteria, UA NONE SEEN NONE SEEN   Squamous Epithelial / HPF 0-5 0 - 5 /HPF  Basic metabolic panel  Result Value Ref Range   Sodium 134 (L) 135 - 145 mmol/L   Potassium 4.2 3.5 - 5.1 mmol/L  Chloride 92 (L) 98 - 111 mmol/L   CO2 30 22 - 32 mmol/L   Glucose, Bld 112 (H) 70 - 99 mg/dL   BUN 17 8 - 23 mg/dL   Creatinine, Ser 0.74 0.44 - 1.00 mg/dL   Calcium 8.7 (L) 8.9 - 10.3 mg/dL   GFR, Estimated >60 >60 mL/min   Anion gap 12 5 - 15  CBC  Result Value Ref Range   WBC 11.1 (H) 4.0 - 10.5 K/uL   RBC 2.71 (L) 3.87 - 5.11 MIL/uL   Hemoglobin 8.3 (L) 12.0 - 15.0 g/dL   HCT 25.1 (L) 36.0 - 46.0 %   MCV 92.6 80.0 - 100.0 fL   MCH 30.6 26.0 - 34.0 pg   MCHC 33.1 30.0 - 36.0 g/dL   RDW 16.0 (H) 11.5 - 15.5 %   Platelets 317 150 - 400 K/uL   nRBC 0.0 0.0 - 0.2 %  Procalcitonin - Baseline  Result Value Ref Range   Procalcitonin 0.25 ng/mL  Basic metabolic panel  Result Value Ref Range   Sodium 133 (L) 135 - 145 mmol/L   Potassium 4.7 3.5 - 5.1 mmol/L   Chloride 92 (L) 98 - 111 mmol/L   CO2 33 (H) 22 - 32 mmol/L   Glucose, Bld 140 (H) 70 - 99 mg/dL   BUN 25 (H) 8 - 23 mg/dL   Creatinine, Ser 0.70 0.44 - 1.00 mg/dL   Calcium 8.8 (L) 8.9 - 10.3 mg/dL   GFR, Estimated >60 >60 mL/min   Anion gap 8 5 - 15  CBC  Result Value Ref Range   WBC 10.5 4.0 - 10.5 K/uL   RBC 2.62 (L) 3.87 - 5.11 MIL/uL   Hemoglobin 7.9 (L) 12.0 - 15.0 g/dL   HCT 24.7 (L) 36.0 - 46.0 %    MCV 94.3 80.0 - 100.0 fL   MCH 30.2 26.0 - 34.0 pg   MCHC 32.0 30.0 - 36.0 g/dL   RDW 16.6 (H) 11.5 - 15.5 %   Platelets 354 150 - 400 K/uL   nRBC 0.0 0.0 - 0.2 %  Magnesium  Result Value Ref Range   Magnesium 2.0 1.7 - 2.4 mg/dL  Troponin I (High Sensitivity)  Result Value Ref Range   Troponin I (High Sensitivity) 45 (H) <18 ng/L  Troponin I (High Sensitivity)  Result Value Ref Range   Troponin I (High Sensitivity) 35 (H) <18 ng/L      Assessment & Plan:   Problem List Items Addressed This Visit       Respiratory   COPD, severe ([place]) - Primary    Doing well. Lungs clear today. Seeing pulmonology shortly. Continue inhalers and oxygen. Continue to monitor. Call with any concerns.         Follow up plan: Return in about 4 weeks (around 05/25/2022).

## 2022-04-28 ENCOUNTER — Telehealth: Payer: Self-pay | Admitting: Family Medicine

## 2022-04-28 DIAGNOSIS — J9621 Acute and chronic respiratory failure with hypoxia: Secondary | ICD-10-CM | POA: Diagnosis not present

## 2022-04-28 DIAGNOSIS — R413 Other amnesia: Secondary | ICD-10-CM | POA: Diagnosis not present

## 2022-04-28 DIAGNOSIS — J189 Pneumonia, unspecified organism: Secondary | ICD-10-CM | POA: Diagnosis not present

## 2022-04-28 DIAGNOSIS — J849 Interstitial pulmonary disease, unspecified: Secondary | ICD-10-CM | POA: Diagnosis not present

## 2022-04-28 DIAGNOSIS — G3184 Mild cognitive impairment, so stated: Secondary | ICD-10-CM | POA: Diagnosis not present

## 2022-04-28 DIAGNOSIS — J439 Emphysema, unspecified: Secondary | ICD-10-CM | POA: Diagnosis not present

## 2022-04-28 NOTE — Telephone Encounter (Signed)
Spoke with [facility] and provided OK for verbal orders for the patient. Verbalized understanding.

## 2022-04-28 NOTE — Telephone Encounter (Signed)
OK for verbal orders?

## 2022-04-28 NOTE — Telephone Encounter (Signed)
Copied from [place] [id number]. Topic: Quick Communication - Home Health Verbal Orders >> Apr 28, 2022 11:45 AM Cyndi Bender wrote: Caller/Agency: Kathlee Nations with [facility] Number: [phone] Requesting OT/PT/Skilled Nursing/Social Work/Speech Therapy: nursing Frequency: nursing resumption

## 2022-04-29 ENCOUNTER — Encounter: Admitting: Family

## 2022-04-29 DIAGNOSIS — J449 Chronic obstructive pulmonary disease, unspecified: Secondary | ICD-10-CM | POA: Diagnosis not present

## 2022-04-29 DIAGNOSIS — Z9981 Dependence on supplemental oxygen: Secondary | ICD-10-CM | POA: Diagnosis not present

## 2022-04-29 DIAGNOSIS — J479 Bronchiectasis, uncomplicated: Secondary | ICD-10-CM | POA: Diagnosis not present

## 2022-04-29 NOTE — Assessment & Plan Note (Signed)
Doing well. Lungs clear today. Seeing pulmonology shortly. Continue inhalers and oxygen. Continue to monitor. Call with any concerns.

## 2022-05-03 ENCOUNTER — Ambulatory Visit: Payer: Medicare Other | Admitting: Family Medicine

## 2022-05-04 ENCOUNTER — Encounter: Payer: Self-pay | Admitting: Family

## 2022-05-04 ENCOUNTER — Ambulatory Visit: Payer: Medicare Other | Attending: Family | Admitting: Family

## 2022-05-04 VITALS — BP 140/61 | HR 64 | Resp 16 | Ht 59.0 in | Wt 89.0 lb

## 2022-05-04 DIAGNOSIS — I10 Essential (primary) hypertension: Secondary | ICD-10-CM | POA: Diagnosis not present

## 2022-05-04 DIAGNOSIS — Z79899 Other long term (current) drug therapy: Secondary | ICD-10-CM | POA: Insufficient documentation

## 2022-05-04 DIAGNOSIS — I5032 Chronic diastolic (congestive) heart failure: Secondary | ICD-10-CM

## 2022-05-04 DIAGNOSIS — R0602 Shortness of breath: Secondary | ICD-10-CM | POA: Diagnosis not present

## 2022-05-04 DIAGNOSIS — K5900 Constipation, unspecified: Secondary | ICD-10-CM | POA: Diagnosis not present

## 2022-05-04 DIAGNOSIS — M8580 Other specified disorders of bone density and structure, unspecified site: Secondary | ICD-10-CM | POA: Insufficient documentation

## 2022-05-04 DIAGNOSIS — I2510 Atherosclerotic heart disease of native coronary artery without angina pectoris: Secondary | ICD-10-CM | POA: Insufficient documentation

## 2022-05-04 DIAGNOSIS — R059 Cough, unspecified: Secondary | ICD-10-CM | POA: Diagnosis not present

## 2022-05-04 DIAGNOSIS — F32A Depression, unspecified: Secondary | ICD-10-CM | POA: Insufficient documentation

## 2022-05-04 DIAGNOSIS — I110 Hypertensive heart disease with heart failure: Secondary | ICD-10-CM | POA: Diagnosis not present

## 2022-05-04 DIAGNOSIS — I480 Paroxysmal atrial fibrillation: Secondary | ICD-10-CM | POA: Diagnosis not present

## 2022-05-04 DIAGNOSIS — K219 Gastro-esophageal reflux disease without esophagitis: Secondary | ICD-10-CM | POA: Insufficient documentation

## 2022-05-04 DIAGNOSIS — Z9981 Dependence on supplemental oxygen: Secondary | ICD-10-CM | POA: Insufficient documentation

## 2022-05-04 DIAGNOSIS — I4891 Unspecified atrial fibrillation: Secondary | ICD-10-CM | POA: Insufficient documentation

## 2022-05-04 DIAGNOSIS — Z87891 Personal history of nicotine dependence: Secondary | ICD-10-CM | POA: Diagnosis not present

## 2022-05-04 DIAGNOSIS — F419 Anxiety disorder, unspecified: Secondary | ICD-10-CM | POA: Diagnosis not present

## 2022-05-04 DIAGNOSIS — J449 Chronic obstructive pulmonary disease, unspecified: Secondary | ICD-10-CM | POA: Diagnosis not present

## 2022-05-04 DIAGNOSIS — E785 Hyperlipidemia, unspecified: Secondary | ICD-10-CM | POA: Insufficient documentation

## 2022-05-04 NOTE — Progress Notes (Signed)
Patient ID: Kristi Clark, female    DOB: 1950/04/26, 72 y.o.   MRN: [id number]  HPI  Kristi Clark is a 72 y/o female with a history of CAD, hyperlipidemia, HTN, atrial fibrillation, severe COPD, depression, GERD, osteopenia, tobacco use and chronic heart failure.   Echo 01/14/22 showed an EF of 55-60% along with mild LVH, moderate LAE/RAE, mild MR and mild/moderate AR.   [place] 09/17/21 showed:   Mid Cx lesion is 55% stenosed.   Dist LAD lesion is 55% stenosed.   The left ventricular systolic function is normal.   LV end diastolic pressure is mildly elevated.  Admitted 04/18/22 due to SOB due to HF/ COPD exacerbation with bronchopneumonia. Needed higher oxygen but able to be weaned back to home 3L. Antibiotics, steroids and nebulizer given.   She presents today for her initial HF clinic visit with a chief complaint of moderate fatigue with minimal exertion. Describes this as chronic in nature. Has associated cough, SOB, intermittent chest pain, constipation, light-headedness, easy bruising, anxiety and difficulty sleeping along with this. Denies any abdominal distention, palpitations, pedal edema, wheezing or weight gain.   Admits that she feels quite anxious at time because she feels like she's going to get re-admitted to the hospital again. Mentions the pill burden that she has and that her PCP was working on this.   Past Medical History:  Diagnosis Date   AK (actinic keratosis) 10/25/2017   left forehead   Arrhythmia    atrial fibrillation   BCC (basal cell carcinoma) 10/25/2017   left upper lateral eyelid, Moh's   CHF (congestive heart failure) (HCC)    COPD (chronic obstructive pulmonary disease) ([place])    Coronary artery disease    Depression    Dysrhythmia    GERD (gastroesophageal reflux disease)    Hyperlipidemia    Hypertension    Menopause    Osteopenia    Severe sepsis ([place]) 07/14/2014   Tobacco abuse    Past Surgical History:  Procedure Laterality Date    CARDIAC CATHETERIZATION     CHOLECYSTECTOMY  2014   COLONOSCOPY WITH PROPOFOL N/A 01/16/2018   Procedure: COLONOSCOPY WITH PROPOFOL;  Surgeon: Lollie Sails, MD;  Location: [facility] ENDOSCOPY;  Service: Endoscopy;  Laterality: N/A;   CORONARY ANGIOPLASTY     ESOPHAGOGASTRODUODENOSCOPY (EGD) WITH PROPOFOL N/A 01/16/2018   Procedure: ESOPHAGOGASTRODUODENOSCOPY (EGD) WITH PROPOFOL;  Surgeon: Lollie Sails, MD;  Location: [facility] ENDOSCOPY;  Service: Endoscopy;  Laterality: N/A;   heart stent     LEFT HEART CATH AND CORONARY ANGIOGRAPHY N/A 09/17/2021   Procedure: LEFT HEART CATH AND CORONARY ANGIOGRAPHY;  Surgeon: Dionisio David, MD;  Location: [facility] CV LAB;  Service: Cardiovascular;  Laterality: N/A;   TEE WITHOUT CARDIOVERSION N/A 05/11/2021   Procedure: TRANSESOPHAGEAL ECHOCARDIOGRAM (TEE);  Surgeon: Corey Skains, MD;  Location: [facility] ORS;  Service: Cardiovascular;  Laterality: N/A;   TOTAL ABDOMINAL HYSTERECTOMY     Family History  Problem Relation Age of Onset   Cancer Mother    Stroke Mother    Heart disease Father    Hyperlipidemia Father    Cancer Sister    Diabetes Sister    Breast cancer Sister 77   Diabetes Brother    Asthma Son    Cancer Son    Diabetes Daughter    Hypertension Daughter    Cancer Maternal Grandmother        gallbladder   Diabetes Brother    Heart disease Brother  Breast cancer Paternal Aunt    Social History   Tobacco Use   Smoking status: Former    Packs/day: 1.00    Years: 56.00    Total pack years: 56.00    Types: Cigarettes   Smokeless tobacco: Never  Substance Use Topics   Alcohol use: No   Allergies  Allergen Reactions   Clindamycin/Lincomycin Hives   Prednisone Other (See Comments)    Psychosis   Amoxicillin Rash   Avelox [Moxifloxacin Hcl In Nacl] Rash   Codeine Sulfate Nausea Only   Penicillins Rash   Sulfa Antibiotics Rash   Prior to Admission medications   Medication Sig Start Date End Date Taking?  Authorizing Provider  albuterol (PROVENTIL) (2.5 MG/3ML) 0.083% nebulizer solution Take 3 mLs (2.5 mg total) by nebulization every 4 (four) hours as needed for wheezing or shortness of breath. 04/01/22  Yes Johnson, Megan P, DO  albuterol (VENTOLIN HFA) 108 (90 Base) MCG/ACT inhaler Inhale 2 puffs into the lungs every 6 (six) hours as needed for wheezing or shortness of breath. 08/16/21  Yes Johnson, Megan P, DO  Aloe-Sodium Chloride (AYR SALINE NASAL GEL NA) Place 1 Application into the nose as needed (dry nares).   Yes [provider]  amiodarone (PACERONE) 200 MG tablet Take 1 tablet (200 mg total) by mouth daily. 01/22/22  Yes Wyvonnia Dusky, MD  amLODipine (NORVASC) 2.5 MG tablet Take 1 tablet (2.5 mg total) by mouth daily. 02/21/22  Yes Johnson, Megan P, DO  aspirin 81 MG tablet Take 81 mg by mouth daily.   Yes [provider]  atorvastatin (LIPITOR) 40 MG tablet Take 1 tablet (40 mg total) by mouth daily. 03/23/22  Yes Johnson, Megan P, DO  Blood Pressure Monitoring (BLOOD PRESSURE MONITOR/WRIST) KIT Take Blood pressure as needed, Dx: I12.9 12/09/20  Yes Johnson, Megan P, DO  cetirizine (ZYRTEC) 10 MG tablet Take 10 mg by mouth daily.   Yes [provider]  clopidogrel (PLAVIX) 75 MG tablet Take 1 tablet (75 mg total) by mouth daily. 03/23/22  Yes Johnson, Megan P, DO  feeding supplement (ENSURE ENLIVE / ENSURE PLUS) LIQD Take 237 mLs by mouth 2 (two) times daily between meals. 04/21/22  Yes Enzo Bi, MD  ferrous sulfate 325 (65 FE) MG tablet Take 1 tablet (325 mg total) by mouth daily with breakfast. 03/09/22  Yes Ezekiel Slocumb, DO  Fluticasone-Umeclidin-Vilant (TRELEGY ELLIPTA) 100-62.5-25 MCG/ACT AEPB 100 mcg DAILY (route: inhalation) 04/11/22  Yes [provider]  furosemide (LASIX) 20 MG tablet Take 1 tablet (20 mg total) by mouth daily. 03/03/22  Yes Johnson, Megan P, DO  hydrALAZINE (APRESOLINE) 25 MG tablet Take 0.5 tablets (12.5 mg total) by mouth 2  (two) times daily. 10/05/21  Yes Johnson, Megan P, DO  hydrochlorothiazide (MICROZIDE) 12.5 MG capsule Take 1 capsule (12.5 mg total) by mouth daily as needed. 12/27/21  Yes Johnson, Megan P, DO  isosorbide mononitrate (IMDUR) 60 MG 24 hr tablet Take 1 tablet (60 mg total) by mouth 2 (two) times daily. 03/31/22  Yes Johnson, Megan P, DO  losartan (COZAAR) 25 MG tablet Take 1 tablet (25 mg total) by mouth daily. 03/23/22  Yes Johnson, Megan P, DO  metoprolol succinate (TOPROL-XL) 50 MG 24 hr tablet Take 1 tablet (50 mg total) by mouth daily. Take with or immediately following a meal. 03/23/22  Yes Johnson, Megan P, DO  mirtazapine (REMERON) 45 MG tablet Take 1 tablet (45 mg total) by mouth at bedtime. 12/21/21  Yes  Johnson, Megan P, DO  Multiple Vitamin (MULTIVITAMIN WITH MINERALS) TABS tablet Take 1 tablet by mouth daily. 04/22/22  Yes Enzo Bi, MD  Multiple Vitamins-Minerals (CENTRUM SILVER ADULT 50+ PO) Take 1 tablet by mouth daily.   Yes [provider]  Multiple Vitamins-Minerals (PRESERVISION AREDS 2+MULTI VIT) CAPS Take 1 capsule by mouth at bedtime.   Yes [provider]  mupirocin ointment (BACTROBAN) 2 % Apply 1 Application topically 2 (two) times daily. 04/27/22  Yes Johnson, Megan P, DO  nystatin (MYCOSTATIN) 100000 UNIT/ML suspension Take 5 mLs (500,000 Units total) by mouth 4 (four) times daily. 03/18/22  Yes Johnson, Megan P, DO  pantoprazole (PROTONIX) 40 MG tablet Take 1 tablet (40 mg total) by mouth 2 (two) times daily. 03/31/22 03/31/23 Yes Johnson, Megan P, DO  polyethylene glycol powder (GLYCOLAX/MIRALAX) 17 GM/SCOOP powder Take 17 g by mouth 3 (three) times daily. 03/03/22  Yes Johnson, Megan P, DO  QUEtiapine (SEROQUEL XR) 50 MG TB24 24 hr tablet Take 2 tablets (100 mg total) by mouth at bedtime. 04/08/22  Yes Johnson, Megan P, DO  saline (AYR) GEL Place 1 Application into both nostrils every 4 (four) hours as needed. 12/10/21  Yes Johnson, Megan P, DO   Review of Systems   Constitutional:  Positive for appetite change (fluctuating) and fatigue (easily).  HENT:  Negative for congestion, postnasal drip and sore throat.   Eyes: Negative.   Respiratory:  Positive for cough and shortness of breath. Negative for chest tightness and wheezing.   Cardiovascular:  Positive for chest pain (at times). Negative for palpitations and leg swelling.  Gastrointestinal:  Positive for constipation. Negative for abdominal distention and abdominal pain.  Endocrine: Negative.   Genitourinary: Negative.   Musculoskeletal:  Positive for back pain. Negative for neck pain.  Skin: Negative.   Allergic/Immunologic: Negative.   Neurological:  Positive for light-headedness (at times). Negative for dizziness.  Hematological:  Negative for adenopathy. Bruises/bleeds easily.  Psychiatric/Behavioral:  Positive for sleep disturbance (sleeping on 1 pillow; with oxygen at 3L RTC). Negative for dysphoric mood. The patient is nervous/anxious.     Vitals:   05/04/22 1028  BP: (!) 140/61  Pulse: 64  Resp: 16  SpO2: 100%  Weight: 89 lb (40.4 kg)  Height: '4\' 11"'$  (1.499 m)   Wt Readings from Last 3 Encounters:  05/04/22 89 lb (40.4 kg)  04/20/22 90 lb 2.7 oz (40.9 kg)  04/15/22 94 lb 11.2 oz (43 kg)   Lab Results  Component Value Date   CREATININE 0.70 04/21/2022   CREATININE 0.74 04/19/2022   CREATININE 0.72 04/18/2022   Physical Exam Vitals and nursing note reviewed. Exam conducted with a chaperone present (daughter).  Constitutional:      Appearance: Normal appearance.  HENT:     Head: Normocephalic and atraumatic.  Cardiovascular:     Rate and Rhythm: Normal rate and regular rhythm.  Pulmonary:     Effort: Pulmonary effort is normal. No respiratory distress.     Breath sounds: No wheezing or rales.  Abdominal:     General: There is no distension.     Palpations: Abdomen is soft.     Tenderness: There is no abdominal tenderness.  Musculoskeletal:        General: No  tenderness.     Cervical back: Normal range of motion and neck supple.     Right lower leg: No edema.     Left lower leg: No edema.  Skin:    General: Skin is  warm and dry.  Neurological:     General: No focal deficit present.     Mental Status: She is alert and oriented to person, place, and time.  Psychiatric:        Mood and Affect: Mood is anxious.        Behavior: Behavior normal.        Thought Content: Thought content normal.   Assessment & Plan:   1: Chronic heart failure with preserved ejection fraction with LVH/ LAE- - NYHA class III - euvolemic today - weighing daily; instructed to call for an overnight weight gain of > 2 pounds or a weekly weight gain of > 5 pounds - not adding salt  - furosemide '20mg'$  daily - HCTZ 12.'5mg'$  PRN - losartan '25mg'$  daily - metoprolol succinate '50mg'$  daily - ReDs clip reading today was 27% - BNP 04/18/22 was 746.3  2: HTN- - BP 140/61 - hydralazine 12.'5mg'$  BID - isosorbide MN '60mg'$  BID - amlodipine 2.'5mg'$  daily - saw PCP Wynetta Emery) 04/27/22 - BMP 04/21/22 showed sodium 133, potassium 4.7, creatinine 0.7 and GFR >60  3: Atrial fibrillation- - to see cardiology Humphrey Rolls) 05/10/22 - amiodarone '200mg'$  daily - TSH 04/18/22 was 0.701  4: COPD (severe)- - saw pulmonology Raul Del) 04/29/22; returns in 1 month - stopped smoking 12/2021 - wearing oxygen at 3L around the clock  5: Anxiety- - quetiapine '100mg'$  QHS - mirtazapine '45mg'$  QHS   Medication list reviewed. Patient mentioned pill burden but when I attempted to address this, she says that her PCP had been working on this so no changes were made today.  Due to HF stability, will not make a return appointment at this time. Advised patient and her daughter that if she needs to be seen in the future, to just call us and we can schedule an appointment at that time. She was comfortable with this plan.

## 2022-05-04 NOTE — Patient Instructions (Addendum)
Continue weighing daily and call for an overnight weight gain of 3 pounds or more or a weekly weight gain of more than 5 pounds.  ° ° ° °Call us in the future if you need us for anything °

## 2022-05-05 ENCOUNTER — Ambulatory Visit: Payer: Self-pay | Admitting: *Deleted

## 2022-05-05 NOTE — Patient Outreach (Signed)
  Care Coordination   Follow Up Visit Note   05/05/2022 Name: Kristi Clark MRN: [id number] DOB: 1951/01/09  Kristi Clark is a 72 y.o. year old female who sees Kristi Roys, DO for primary care. I spoke with daughter Kristi Clark by phone today.  What matters to the patients health and wellness today?  Per daughter, there is concern for patient's decreased appetite, family encouraging PO intake.  Denies any urgent concerns, encouraged to contact this care manager with questions.      Goals Addressed             This Visit's Progress    Effective management of heart conditions   On track    Care Coordination Interventions: Counseled on increased risk of stroke due to Afib and benefits of anticoagulation for stroke prevention Afib action plan reviewed Provided education on low sodium diet Reviewed Heart Failure Action Plan in depth and provided written copy Advised patient to weigh each morning after emptying bladder Discussed importance of daily weight and advised patient to weigh and record daily Evaluation of current treatment plan related to hypertension self management and patient's adherence to plan as established by provider Reviewed medications with patient and discussed importance of compliance Discussed plans with patient for ongoing care management follow up and provided patient with direct contact information for care management team Reviewed scheduled/upcoming provider appointments including:  Has intermittent shortness of breath, on oxygen at home.  State she feels she's not able to do anything for herself   Call placed to [facility], confirmed they have started for PT/OT.  Initial visit on 2/22, next visit tomorrow Encouraged to monitor BP, weight, and HR daily, recording readings to share with provider.  Appointments with PCP on 3/20 and pulmonary on 3/22 Discussed decreased appetite, encouraged to use Ensure/Boost for supplement            SDOH assessments and interventions completed:  No     Care Coordination Interventions:  Yes, provided   Follow up plan: Follow up call scheduled for 3/28    Encounter Outcome:  Pt. Visit Completed   Kristi David, RN, MSN, [place] [facility] Care Management Coordinator [phone]

## 2022-05-06 DIAGNOSIS — J9621 Acute and chronic respiratory failure with hypoxia: Secondary | ICD-10-CM | POA: Diagnosis not present

## 2022-05-06 DIAGNOSIS — R413 Other amnesia: Secondary | ICD-10-CM | POA: Diagnosis not present

## 2022-05-06 DIAGNOSIS — G3184 Mild cognitive impairment, so stated: Secondary | ICD-10-CM | POA: Diagnosis not present

## 2022-05-06 DIAGNOSIS — J439 Emphysema, unspecified: Secondary | ICD-10-CM | POA: Diagnosis not present

## 2022-05-06 DIAGNOSIS — J849 Interstitial pulmonary disease, unspecified: Secondary | ICD-10-CM | POA: Diagnosis not present

## 2022-05-06 DIAGNOSIS — J189 Pneumonia, unspecified organism: Secondary | ICD-10-CM | POA: Diagnosis not present

## 2022-05-09 DIAGNOSIS — G3184 Mild cognitive impairment, so stated: Secondary | ICD-10-CM | POA: Diagnosis not present

## 2022-05-09 DIAGNOSIS — J189 Pneumonia, unspecified organism: Secondary | ICD-10-CM | POA: Diagnosis not present

## 2022-05-09 DIAGNOSIS — J849 Interstitial pulmonary disease, unspecified: Secondary | ICD-10-CM | POA: Diagnosis not present

## 2022-05-09 DIAGNOSIS — J439 Emphysema, unspecified: Secondary | ICD-10-CM | POA: Diagnosis not present

## 2022-05-09 DIAGNOSIS — J9621 Acute and chronic respiratory failure with hypoxia: Secondary | ICD-10-CM | POA: Diagnosis not present

## 2022-05-09 DIAGNOSIS — R413 Other amnesia: Secondary | ICD-10-CM | POA: Diagnosis not present

## 2022-05-10 ENCOUNTER — Ambulatory Visit (INDEPENDENT_AMBULATORY_CARE_PROVIDER_SITE_OTHER): Payer: Medicare Other | Admitting: Cardiovascular Disease

## 2022-05-10 ENCOUNTER — Telehealth: Payer: Self-pay | Admitting: Family Medicine

## 2022-05-10 ENCOUNTER — Encounter: Payer: Self-pay | Admitting: Cardiovascular Disease

## 2022-05-10 VITALS — BP 146/68 | HR 62 | Ht 59.0 in | Wt 90.2 lb

## 2022-05-10 DIAGNOSIS — I700 Atherosclerosis of aorta: Secondary | ICD-10-CM | POA: Diagnosis not present

## 2022-05-10 DIAGNOSIS — I5033 Acute on chronic diastolic (congestive) heart failure: Secondary | ICD-10-CM | POA: Diagnosis not present

## 2022-05-10 DIAGNOSIS — I10 Essential (primary) hypertension: Secondary | ICD-10-CM | POA: Diagnosis not present

## 2022-05-10 DIAGNOSIS — I4891 Unspecified atrial fibrillation: Secondary | ICD-10-CM

## 2022-05-10 DIAGNOSIS — I2510 Atherosclerotic heart disease of native coronary artery without angina pectoris: Secondary | ICD-10-CM

## 2022-05-10 NOTE — Telephone Encounter (Signed)
Kristi Clark was notified and verbalized understanding for Dr Wynetta Emery verbal [name] for orders.

## 2022-05-10 NOTE — Telephone Encounter (Signed)
Copied from [place] [id number]. Topic: Quick Communication - Home Health Verbal Orders >> May 10, 2022  9:30 AM Everette C wrote: Caller/Agency: [facility]con[facility]  Callback Number: [id number] Requesting OT/PT/Skilled Nursing/Social Work/Speech Therapy: PT Frequency: 1w9

## 2022-05-10 NOTE — Assessment & Plan Note (Signed)
Overall doing better

## 2022-05-10 NOTE — Telephone Encounter (Signed)
OK for verbal orders?

## 2022-05-10 NOTE — Progress Notes (Signed)
Cardiology Office Note   Date:  05/10/2022   ID:  Kristi Clark, DOB November 10, 1950, MRN [id number]  PCP:  [name], DO  Cardiologist:  Neoma Laming, MD      History of Present Illness: Kristi Clark is a 72 y.o. female who presents for  Chief Complaint  Patient presents with   Follow-up    3 month follow up    Chest Pain  This is a chronic problem. The current episode started in the past 7 days. The problem occurs intermittently. The problem has been unchanged. The pain is present in the substernal region. The pain is at a severity of 3/10. The quality of the pain is described as pressure.      Past Medical History:  Diagnosis Date   AK (actinic keratosis) 10/25/2017   left forehead   Arrhythmia    atrial fibrillation   BCC (basal cell carcinoma) 10/25/2017   left upper lateral eyelid, Moh's   CHF (congestive heart failure) (HCC)    COPD (chronic obstructive pulmonary disease) ([place])    Coronary artery disease    Depression    Dysrhythmia    GERD (gastroesophageal reflux disease)    Hyperlipidemia    Hypertension    Menopause    Osteopenia    Severe sepsis ([place]) 07/14/2014   Tobacco abuse      Past Surgical History:  Procedure Laterality Date   CARDIAC CATHETERIZATION     CHOLECYSTECTOMY  2014   COLONOSCOPY WITH PROPOFOL N/A 01/16/2018   Procedure: COLONOSCOPY WITH PROPOFOL;  Surgeon: Lollie Sails, MD;  Location: [facility] ENDOSCOPY;  Service: Endoscopy;  Laterality: N/A;   CORONARY ANGIOPLASTY     ESOPHAGOGASTRODUODENOSCOPY (EGD) WITH PROPOFOL N/A 01/16/2018   Procedure: ESOPHAGOGASTRODUODENOSCOPY (EGD) WITH PROPOFOL;  Surgeon: Lollie Sails, MD;  Location: [facility] ENDOSCOPY;  Service: Endoscopy;  Laterality: N/A;   heart stent     LEFT HEART CATH AND CORONARY ANGIOGRAPHY N/A 09/17/2021   Procedure: LEFT HEART CATH AND CORONARY ANGIOGRAPHY;  Surgeon: Dionisio David, MD;  Location: [place] CV LAB;  Service: Cardiovascular;   Laterality: N/A;   TEE WITHOUT CARDIOVERSION N/A 05/11/2021   Procedure: TRANSESOPHAGEAL ECHOCARDIOGRAM (TEE);  Surgeon: Corey Skains, MD;  Location: [facility] ORS;  Service: Cardiovascular;  Laterality: N/A;   TOTAL ABDOMINAL HYSTERECTOMY       REASON FOR VISIT  Referred by Dr.Mannie Ohlin Humphrey Rolls.        TESTS  Imaging: Computed Tomographic Angiography:  Cardiac multidetector CT was performed paying particular attention to the coronary arteries for the diagnosis of: Chest Pain.     TEST CONCLUSIONS  No IV access   CA score only  1- Calcium score: 1572.3.      Neoma Laming MD  Electronically signed by: Neoma Laming     Date: 08/12/2021 09:26 TESTS    [facility] [address] [place], [place] [address] [phone] STUDY:  Gated Stress / Rest Myocardial Perfusion Imaging Tomographic (SPECT) Including attenuation correction Wall Motion, Left Ventricular Ejection Fraction By Gated Technique.Persantine Stress Test. SEX: Female  WEIGHT: 88 lbs   HEIGHT: 59 in     ARMS UP: YES/NO  REFERRING PHYSICIAN:  Dr.Gagandeep Kossman Humphrey Rolls                                                                                                                                                                                                                      INDICATION FOR STUDY:  CP                                                                                                                                                                                                                    TECHNIQUE:  Approximately 20 minutes following the intravenous administration of 9.7 mCi of Tc-11mSestamibi after stress testing in a reclined supine position with arms above their head if able to do so, gated SPECT imaging of the heart was  performed. After about a 2hr break, the patient was injected intravenously with 34.1 mCi of Tc-925mestamibi.  Approximately 45 minutes later in the same position as stress imaging SPECT rest imaging of the heart was performed.  STRESS BY:  ShNeoma LamingMD PROTOCOL:  Persantine    DOSE ADMIN: 4.5 CC   ROUTE OF ADMINISTRATION: IV  MAX PRED HR: 142                     85%: 125               75%: 110                                                                                                                   RESTING BP:  110/62   RESTING HR: 67 PEAK BP: 110/58   PEAK HR: 82                                                                    EXERCISE DURATION:    4 min injection                                            REASON FOR TEST TERMINATION:    Protocol end                                                                                                                              SYMPTOMS:   None  EKG RESULTS: NSR. 67/min. No significant ST changes with persantine.                                                              IMAGE QUALITY:  Good                                                                                                                                                                                                                                                                                                                                 PERFUSION/WALL MOTION FINDINGS: EF = 66%. No perfusion defects, mild inferior wall hypokinesis.                                                                           IMPRESSION:  Normal stress test with normal LVEF.  Neoma Laming, MD Stress Interpreting Physician / Nuclear Interpreting Physician                Neoma Laming MD  Electronically signed by: Neoma Laming     Date: 02/04/2022 09:14 REASON FOR VISIT  Visit for: Echocardiogram/Essential primary hypertension  Sex:  Female   wt= 115   lbs.  BP= 142/66  Height= 57   inches.        TESTS  Imaging: Echocardiogram:  An echocardiogram in (2-d) mode was performed and in Doppler mode with color flow velocity mapping was performed. The aortic valve cusps are abnormal 1.7   cm, flow velocity 0.762    m/s, and systolic calculated mean flow gradient 1   mmHg. Mitral valve diastolic peak flow velocity E 0.898     m/s and E/A ratio 1.0. Aortic root diameter 2.9   cm. The LVOT internal diameter 2.8  cm and flow velocity was abnormal 123456   m/s. LV systolic dimension 123456   cm, diastolic [id number]   cm, posterior wall thickness 1.05   cm, fractional shortening 28.2  %, and EF 55.2  %. IVS thickness 0.799  cm. LA dimension 3.4 cm. Mitral Valve has Mild Regurgitation. Tricuspid Valve has Mild Regurgitation.     ASSESSMENT  Technically adequate study.  Normal chamber sizes.  Normal left ventricular systolic function.  Normal right ventricular systolic function.  Normal right ventricular diastolic function.  Normal left ventricular wall motion.  Normal right ventricular wall motion.  Mild tricuspid regurgitation.  Normal pulmonary artery pressure.  Mild mitral regurgitation.  No pericardial effusion.  Normal left atrium.  Normal left ventricle.  Normal right atrium.  Normal right ventricle.  Normal Ao root.  Normal ascending Ao.  Normal LVH.  .     THERAPY   Referring physician: Dionisio David  Sonographer:  Neoma Laming.      Neoma Laming MD  Electronically signed by: Neoma Laming     Date: 03/16/2020 15:32 Current Outpatient Medications  Medication Sig Dispense Refill   albuterol (PROVENTIL) (2.5 MG/3ML) 0.083% nebulizer solution Take 3 mLs (2.5 mg total) by nebulization every 4 (four) hours as needed for wheezing or shortness of breath. 570 mL 1   albuterol (VENTOLIN HFA) 108 (90 Base) MCG/ACT inhaler Inhale 2 puffs into the lungs every 6 (six) hours as needed for wheezing or shortness of breath. 18 g 3   Aloe-Sodium Chloride (AYR SALINE NASAL GEL NA) Place 1 Application into the nose as needed (dry nares).     amiodarone (PACERONE) 200 MG tablet Take 1 tablet (200 mg total) by mouth daily. 30 tablet 0   amLODipine (NORVASC) 2.5 MG tablet Take 1 tablet (2.5 mg total) by mouth daily. 30 tablet 3   aspirin 81 MG tablet Take 81 mg by mouth daily.     atorvastatin (LIPITOR) 40 MG tablet Take 1 tablet (40 mg total) by mouth daily. 90 tablet 1   Blood Pressure Monitoring (BLOOD PRESSURE MONITOR/WRIST) KIT Take Blood pressure as needed, Dx: I12.9 1 kit 0   cetirizine (ZYRTEC) 10 MG tablet Take 10 mg by mouth daily.     clopidogrel (PLAVIX) 75 MG tablet Take 1 tablet (75 mg total) by mouth daily. 90 tablet 0   feeding supplement (ENSURE ENLIVE / ENSURE PLUS) LIQD Take 237 mLs by mouth 2 (two) times daily between meals. 237 mL 12   ferrous sulfate 325 (65 FE) MG tablet Take 1 tablet (325 mg total) by mouth daily  with breakfast. 30 tablet 3   Fluticasone-Umeclidin-Vilant (TRELEGY ELLIPTA) 100-62.5-25 MCG/ACT AEPB 100 mcg DAILY (route: inhalation)     furosemide (LASIX) 20 MG tablet Take 1 tablet (20 mg total) by mouth daily. 5 tablet 0   hydrALAZINE (APRESOLINE) 25 MG tablet Take 0.5 tablets (12.5 mg total) by mouth 2 (two) times daily. 90 tablet 1   hydrochlorothiazide (MICROZIDE) 12.5 MG capsule Take 1 capsule (12.5 mg total) by mouth daily as needed. 30 capsule 3   isosorbide mononitrate (IMDUR) 60  MG 24 hr tablet Take 1 tablet (60 mg total) by mouth 2 (two) times daily. 90 tablet 1   losartan (COZAAR) 25 MG tablet Take 1 tablet (25 mg total) by mouth daily. 90 tablet 1   metoprolol succinate (TOPROL-XL) 50 MG 24 hr tablet Take 1 tablet (50 mg total) by mouth daily. Take with or immediately following a meal. 90 tablet 0   mirtazapine (REMERON) 45 MG tablet Take 1 tablet (45 mg total) by mouth at bedtime. 90 tablet 1   Multiple Vitamin (MULTIVITAMIN WITH MINERALS) TABS tablet Take 1 tablet by mouth daily.     mupirocin ointment (BACTROBAN) 2 % Apply 1 Application topically 2 (two) times daily. 22 g 0   pantoprazole (PROTONIX) 40 MG tablet Take 1 tablet (40 mg total) by mouth 2 (two) times daily. 60 tablet 9   polyethylene glycol powder (GLYCOLAX/MIRALAX) 17 GM/SCOOP powder Take 17 g by mouth 3 (three) times daily. 3350 g 3   QUEtiapine (SEROQUEL XR) 50 MG TB24 24 hr tablet Take 2 tablets (100 mg total) by mouth at bedtime. 60 tablet 1   Multiple Vitamins-Minerals (CENTRUM SILVER ADULT 50+ PO) Take 1 tablet by mouth daily. (Patient not taking: Reported on 05/10/2022)     Multiple Vitamins-Minerals (PRESERVISION AREDS 2+MULTI VIT) CAPS Take 1 capsule by mouth at bedtime. (Patient not taking: Reported on 05/10/2022)     nystatin (MYCOSTATIN) 100000 UNIT/ML suspension Take 5 mLs (500,000 Units total) by mouth 4 (four) times daily. (Patient not taking: Reported on 05/10/2022) 120 mL 0   saline (AYR) GEL Place 1 Application into both nostrils every 4 (four) hours as needed. (Patient not taking: Reported on 05/10/2022) 14.1 g 6   Current Facility-Administered Medications  Medication Dose Route Frequency Provider Last Rate Last Admin   triamcinolone acetonide (KENALOG-40) injection 40 mg  40 mg Intramuscular Once Johnson, Megan P, DO       Facility-Administered Medications Ordered in Other Visits  Medication Dose Route Frequency Provider Last Rate Last Admin   sodium chloride flush (NS) 0.9 % injection 3 mL   3 mL Intravenous Q12H Scoggins, Amber, NP        Allergies:   Clindamycin/lincomycin, Prednisone, Amoxicillin, Avelox [moxifloxacin hcl in nacl], Codeine sulfate, Penicillins, and Sulfa antibiotics    Social History:   reports that she has quit smoking. Her smoking use included cigarettes. She has a 56.00 pack-year smoking history. She has never used smokeless tobacco. She reports that she does not drink alcohol and does not use drugs.   Family History:  family history includes Asthma in her son; Breast cancer in her paternal aunt; Breast cancer (age of onset: 52) in her sister; Cancer in her maternal grandmother, mother, sister, and son; Diabetes in her brother, brother, daughter, and sister; Heart disease in her brother and father; Hyperlipidemia in her father; Hypertension in her daughter; Stroke in her mother.    ROS:     Review of Systems  Constitutional: Negative.  HENT: Negative.    Eyes: Negative.   Respiratory: Negative.    Cardiovascular:  Positive for chest pain.  Gastrointestinal: Negative.   Genitourinary: Negative.   Musculoskeletal: Negative.   Skin: Negative.   Neurological: Negative.   Endo/Heme/Allergies: Negative.   Psychiatric/Behavioral: Negative.    All other systems reviewed and are negative.     All other systems are reviewed and negative.    PHYSICAL EXAM: VS:  BP (!) 146/68   Pulse 62   Ht '4\' 11"'$  (1.499 m)   Wt 90 lb 3.2 oz (40.9 kg)   LMP  (LMP Unknown)   SpO2 94% Comment: 3L  BMI 18.22 kg/m  , BMI Body mass index is 18.22 kg/m. Last weight:  Wt Readings from Last 3 Encounters:  05/10/22 90 lb 3.2 oz (40.9 kg)  05/04/22 89 lb (40.4 kg)  04/20/22 90 lb 2.7 oz (40.9 kg)     Physical Exam Constitutional:      Appearance: Normal appearance.  Cardiovascular:     Rate and Rhythm: Normal rate and regular rhythm.     Heart sounds: Normal heart sounds.  Pulmonary:     Effort: Pulmonary effort is normal.     Breath sounds: Normal breath  sounds.  Musculoskeletal:     Right lower leg: No edema.     Left lower leg: No edema.  Neurological:     Mental Status: She is alert.       EKG:   Recent Labs: 04/18/2022: ALT 77; B Natriuretic Peptide 746.3; TSH 0.701 04/21/2022: BUN 25; Creatinine, Ser 0.70; Hemoglobin 7.9; Magnesium 2.0; Platelets 354; Potassium 4.7; Sodium 133    Lipid Panel    Component Value Date/Time   CHOL 100 01/15/2022 0640   CHOL 107 08/16/2021 1404   CHOL 148 03/06/2015 0831   TRIG 54 01/15/2022 0640   TRIG 100 03/06/2015 0831   HDL 48 01/15/2022 0640   HDL 49 08/16/2021 1404   CHOLHDL 2.1 01/15/2022 0640   VLDL 11 01/15/2022 0640   VLDL 20 03/06/2015 0831   LDLCALC 41 01/15/2022 0640   LDLCALC 42 08/16/2021 1404      Other studies Reviewed: Additional studies/ records that were reviewed today include:  Review of the above records demonstrates:       No data to display            ASSESSMENT AND PLAN:    ICD-10-CM   1. Acute on chronic diastolic CHF (congestive heart failure) (HCC)  I50.33     2. Aortic atherosclerosis (HCC)  I70.0     3. Atrial fibrillation with RVR (HCC)  I48.91     4. Coronary artery disease involving native coronary artery of native heart without angina pectoris  I25.10     5. Essential hypertension  I10        Problem List Items Addressed This Visit       Cardiovascular and Mediastinum   Essential hypertension   CAD (coronary artery disease)   Aortic atherosclerosis (HCC)   Atrial fibrillation with RVR (HCC)   Acute on chronic diastolic CHF (congestive heart failure) ([place]) - Primary    Overall doing better          Disposition:   Return in about 4 weeks (around 06/07/2022).    Total time spent: 30 minutes  Signed,  Neoma Laming, MD  05/10/2022 9:40 AM    [facility]

## 2022-05-11 DIAGNOSIS — J189 Pneumonia, unspecified organism: Secondary | ICD-10-CM | POA: Diagnosis not present

## 2022-05-11 DIAGNOSIS — K219 Gastro-esophageal reflux disease without esophagitis: Secondary | ICD-10-CM | POA: Diagnosis not present

## 2022-05-11 DIAGNOSIS — G3184 Mild cognitive impairment, so stated: Secondary | ICD-10-CM | POA: Diagnosis not present

## 2022-05-11 DIAGNOSIS — R911 Solitary pulmonary nodule: Secondary | ICD-10-CM | POA: Diagnosis not present

## 2022-05-11 DIAGNOSIS — E440 Moderate protein-calorie malnutrition: Secondary | ICD-10-CM | POA: Diagnosis not present

## 2022-05-11 DIAGNOSIS — D631 Anemia in chronic kidney disease: Secondary | ICD-10-CM | POA: Diagnosis not present

## 2022-05-11 DIAGNOSIS — R296 Repeated falls: Secondary | ICD-10-CM | POA: Diagnosis not present

## 2022-05-11 DIAGNOSIS — J849 Interstitial pulmonary disease, unspecified: Secondary | ICD-10-CM | POA: Diagnosis not present

## 2022-05-11 DIAGNOSIS — J309 Allergic rhinitis, unspecified: Secondary | ICD-10-CM | POA: Diagnosis not present

## 2022-05-11 DIAGNOSIS — R1310 Dysphagia, unspecified: Secondary | ICD-10-CM | POA: Diagnosis not present

## 2022-05-11 DIAGNOSIS — R54 Age-related physical debility: Secondary | ICD-10-CM | POA: Diagnosis not present

## 2022-05-11 DIAGNOSIS — I5033 Acute on chronic diastolic (congestive) heart failure: Secondary | ICD-10-CM | POA: Diagnosis not present

## 2022-05-11 DIAGNOSIS — I130 Hypertensive heart and chronic kidney disease with heart failure and stage 1 through stage 4 chronic kidney disease, or unspecified chronic kidney disease: Secondary | ICD-10-CM | POA: Diagnosis not present

## 2022-05-11 DIAGNOSIS — E871 Hypo-osmolality and hyponatremia: Secondary | ICD-10-CM | POA: Diagnosis not present

## 2022-05-11 DIAGNOSIS — E785 Hyperlipidemia, unspecified: Secondary | ICD-10-CM | POA: Diagnosis not present

## 2022-05-11 DIAGNOSIS — R413 Other amnesia: Secondary | ICD-10-CM | POA: Diagnosis not present

## 2022-05-11 DIAGNOSIS — J9621 Acute and chronic respiratory failure with hypoxia: Secondary | ICD-10-CM | POA: Diagnosis not present

## 2022-05-11 DIAGNOSIS — I480 Paroxysmal atrial fibrillation: Secondary | ICD-10-CM | POA: Diagnosis not present

## 2022-05-11 DIAGNOSIS — J439 Emphysema, unspecified: Secondary | ICD-10-CM | POA: Diagnosis not present

## 2022-05-11 DIAGNOSIS — F32A Depression, unspecified: Secondary | ICD-10-CM | POA: Diagnosis not present

## 2022-05-11 DIAGNOSIS — I2510 Atherosclerotic heart disease of native coronary artery without angina pectoris: Secondary | ICD-10-CM | POA: Diagnosis not present

## 2022-05-11 DIAGNOSIS — N182 Chronic kidney disease, stage 2 (mild): Secondary | ICD-10-CM | POA: Diagnosis not present

## 2022-05-11 DIAGNOSIS — R634 Abnormal weight loss: Secondary | ICD-10-CM | POA: Diagnosis not present

## 2022-05-11 DIAGNOSIS — I700 Atherosclerosis of aorta: Secondary | ICD-10-CM | POA: Diagnosis not present

## 2022-05-11 DIAGNOSIS — J4489 Other specified chronic obstructive pulmonary disease: Secondary | ICD-10-CM | POA: Diagnosis not present

## 2022-05-12 ENCOUNTER — Telehealth: Payer: Self-pay | Admitting: Family Medicine

## 2022-05-12 NOTE — Telephone Encounter (Signed)
Contacted [name] to schedule their annual wellness visit. Appointment made for 06/13/2022.  Sherol Dade; [facility] Direct Dial: [phone]

## 2022-05-13 ENCOUNTER — Telehealth: Payer: Self-pay | Admitting: Family Medicine

## 2022-05-13 NOTE — Telephone Encounter (Signed)
Copied from [place] [phone]. Topic: General - Inquiry >> May 12, 2022  3:57 PM Marcellus Scott wrote: Reason for CRM: Roderick Pee from [facility] called in requesting to verify or confirm if the patient was seen on January 17th and 26th. Unable to locate a medical release form for [facility] [employer], I did not feel comfortable answering questions.  Please advise.

## 2022-05-13 NOTE — Telephone Encounter (Signed)
Spoke with Roderick Pee from [facility] to clarify what information they were requesting. Errol informed me that they needed the office notes from the notes from the visits of 03/23/22 and 04/01/22. Requested office notes were faxed to contact information provided at [phone].

## 2022-05-16 DIAGNOSIS — J9621 Acute and chronic respiratory failure with hypoxia: Secondary | ICD-10-CM | POA: Diagnosis not present

## 2022-05-16 DIAGNOSIS — J849 Interstitial pulmonary disease, unspecified: Secondary | ICD-10-CM | POA: Diagnosis not present

## 2022-05-16 DIAGNOSIS — J4489 Other specified chronic obstructive pulmonary disease: Secondary | ICD-10-CM | POA: Diagnosis not present

## 2022-05-16 DIAGNOSIS — J439 Emphysema, unspecified: Secondary | ICD-10-CM | POA: Diagnosis not present

## 2022-05-16 DIAGNOSIS — G3184 Mild cognitive impairment, so stated: Secondary | ICD-10-CM | POA: Diagnosis not present

## 2022-05-16 DIAGNOSIS — J189 Pneumonia, unspecified organism: Secondary | ICD-10-CM | POA: Diagnosis not present

## 2022-05-25 ENCOUNTER — Ambulatory Visit (INDEPENDENT_AMBULATORY_CARE_PROVIDER_SITE_OTHER): Payer: Medicare Other | Admitting: Family Medicine

## 2022-05-25 ENCOUNTER — Encounter: Payer: Self-pay | Admitting: Family Medicine

## 2022-05-25 VITALS — BP 136/40 | HR 63 | Temp 98.1°F | Ht 59.02 in | Wt 94.4 lb

## 2022-05-25 DIAGNOSIS — M25511 Pain in right shoulder: Secondary | ICD-10-CM | POA: Diagnosis not present

## 2022-05-25 DIAGNOSIS — G8929 Other chronic pain: Secondary | ICD-10-CM | POA: Diagnosis not present

## 2022-05-25 DIAGNOSIS — I10 Essential (primary) hypertension: Secondary | ICD-10-CM

## 2022-05-25 DIAGNOSIS — F339 Major depressive disorder, recurrent, unspecified: Secondary | ICD-10-CM

## 2022-05-25 DIAGNOSIS — J449 Chronic obstructive pulmonary disease, unspecified: Secondary | ICD-10-CM

## 2022-05-25 MED ORDER — MIRTAZAPINE 45 MG PO TABS: 45.0000 mg | ORAL_TABLET | Freq: Every day | ORAL | 1 refills | Status: DC

## 2022-05-25 MED ORDER — CLOPIDOGREL BISULFATE 75 MG PO TABS: 75.0000 mg | ORAL_TABLET | Freq: Every day | ORAL | 1 refills | Status: DC

## 2022-05-25 MED ORDER — HYDRALAZINE HCL 25 MG PO TABS: 12.5000 mg | ORAL_TABLET | Freq: Two times a day (BID) | ORAL | 1 refills | Status: DC

## 2022-05-25 MED ORDER — AMLODIPINE BESYLATE 2.5 MG PO TABS: 2.5000 mg | ORAL_TABLET | Freq: Every day | ORAL | 1 refills | Status: DC

## 2022-05-25 MED ORDER — QUETIAPINE FUMARATE ER 50 MG PO TB24: 100.0000 mg | ORAL_TABLET | Freq: Every day | ORAL | 1 refills | Status: DC

## 2022-05-25 MED ORDER — METOPROLOL SUCCINATE ER 50 MG PO TB24: 50.0000 mg | ORAL_TABLET | Freq: Every day | ORAL | 1 refills | Status: DC

## 2022-05-25 MED ORDER — DICLOFENAC SODIUM 1 % EX GEL: 4.0000 g | Freq: Four times a day (QID) | CUTANEOUS | 3 refills | Status: DC

## 2022-05-25 NOTE — Progress Notes (Signed)
BP (!) 136/40   Pulse 63   Temp 98.1 F (36.7 C) (Oral)   Ht 4' 11.02" (1.499 m)   Wt 94 lb 6.4 oz (42.8 kg)   LMP  (LMP Unknown)   SpO2 93% Comment: 3 liters O2  BMI 19.06 kg/m    Subjective:    Patient ID: Kristi Clark, female    DOB: [birth year], 72 y.o.   MRN: [id number]  HPI: Kristi Clark is a 72 y.o. female  Chief Complaint  Patient presents with   Coronary Artery Disease   Congestive Heart Failure   COPD   Shoulder Pain    Right shoulder pain, believes it is the way she sleep   COPD COPD status: stable Satisfied with current treatment?: yes Oxygen use: yes Dyspnea frequency: often Cough frequency: often Rescue inhaler frequency: several times a day   Limitation of activity: yes Pneumovax: Up to Date Influenza: Up to Date  SHOULDER PAIN Duration: several weeks Involved shoulder: right Mechanism of injury: unknown Location: diffuse Onset:gradual Severity: moderate  Quality:  aching  Frequency: intermittent Radiation: no Aggravating factors: moving, sleeping on it  Alleviating factors: nothing  Status: fluctuating Treatments attempted: rest, ice, heat, sling, APAP, ibuprofen, and physical therapy  Relief with NSAIDs?:  No NSAIDs Taken Weakness: no Numbness: no Decreased grip strength: no Redness: no Swelling: no Bruising: no Fevers: no  DEPRESSION Mood status: better Satisfied with current treatment?: yes Symptom severity: mild  Duration of current treatment : months Side effects: no Medication compliance: excellent compliance Psychotherapy/counseling: no  Previous psychiatric medications: mirtazapine, seroquel Depressed mood: yes Anxious mood: yes Anhedonia: no Significant weight loss or gain: no Insomnia: no  Fatigue: yes Feelings of worthlessness or guilt: no Impaired concentration/indecisiveness: no Suicidal ideations: no Hopelessness: no Crying spells: no    05/25/2022    8:43 AM 04/27/2022   11:31 AM 04/01/2022     2:15 PM 03/03/2022    2:12 PM 02/21/2022    1:43 PM  Depression screen PHQ 2/9  Decreased Interest 1 3 0 2 2  Down, Depressed, Hopeless 2 3 1 1 2   PHQ - 2 Score 3 6 1 3 4   Altered sleeping 0 3 0 1 3  Tired, decreased energy 1 3 2 3 3   Change in appetite 0 3 1 0 2  Feeling bad or failure about yourself  1 3 3 2 2   Trouble concentrating 0 3 0 2 2  Moving slowly or fidgety/restless 0 3 0 1 0  Suicidal thoughts 0 2 0 0 0  PHQ-9 Score 5 26 7 12 16   Difficult doing work/chores Somewhat difficult Extremely dIfficult Somewhat difficult Somewhat difficult Somewhat difficult    Relevant past medical, surgical, family and social history reviewed and updated as indicated. Interim medical history since our last visit reviewed. Allergies and medications reviewed and updated.  Review of Systems  Constitutional: Negative.   Respiratory: Negative.    Cardiovascular: Negative.   Gastrointestinal: Negative.   Musculoskeletal:  Positive for arthralgias. Negative for back pain, gait problem, joint swelling, myalgias, neck pain and neck stiffness.  Skin: Negative.   Neurological: Negative.   Psychiatric/Behavioral: Negative.      Per HPI unless specifically indicated above     Objective:    BP (!) 136/40   Pulse 63   Temp 98.1 F (36.7 C) (Oral)   Ht 4' 11.02" (1.499 m)   Wt 94 lb 6.4 oz (42.8 kg)   LMP  (LMP Unknown)  SpO2 93% Comment: 3 liters O2  BMI 19.06 kg/m   Wt Readings from Last 3 Encounters:  05/25/22 94 lb 6.4 oz (42.8 kg)  05/10/22 90 lb 3.2 oz (40.9 kg)  05/04/22 89 lb (40.4 kg)    Physical Exam Vitals and nursing note reviewed.  Constitutional:      General: She is not in acute distress.    Appearance: Normal appearance. She is not ill-appearing, toxic-appearing or diaphoretic.  HENT:     Head: Normocephalic and atraumatic.     Right Ear: External ear normal.     Left Ear: External ear normal.     Nose: Nose normal.     Mouth/Throat:     Mouth: Mucous membranes  are moist.     Pharynx: Oropharynx is clear.  Eyes:     General: No scleral icterus.       Right eye: No discharge.        Left eye: No discharge.     Extraocular Movements: Extraocular movements intact.     Conjunctiva/sclera: Conjunctivae normal.     Pupils: Pupils are equal, round, and reactive to light.  Cardiovascular:     Rate and Rhythm: Normal rate and regular rhythm.     Pulses: Normal pulses.     Heart sounds: Normal heart sounds. No murmur heard.    No friction rub. No gallop.  Pulmonary:     Effort: Pulmonary effort is normal. No respiratory distress.     Breath sounds: Normal breath sounds. No stridor. No wheezing, rhonchi or rales.  Chest:     Chest wall: No tenderness.  Musculoskeletal:        General: Normal range of motion.     Cervical back: Normal range of motion and neck supple.  Skin:    General: Skin is warm and dry.     Capillary Refill: Capillary refill takes less than 2 seconds.     Coloration: Skin is not jaundiced or pale.     Findings: No bruising, erythema, lesion or rash.  Neurological:     General: No focal deficit present.     Mental Status: She is alert and oriented to person, place, and time. Mental status is at baseline.  Psychiatric:        Mood and Affect: Mood normal.        Behavior: Behavior normal.        Thought Content: Thought content normal.        Judgment: Judgment normal.     Results for orders placed or performed during the hospital encounter of 04/18/22  Resp panel by RT-PCR (RSV, Flu A&B, Covid) Anterior Nasal Swab   Specimen: Anterior Nasal Swab  Result Value Ref Range   SARS Coronavirus 2 by RT PCR NEGATIVE NEGATIVE   Influenza A by PCR NEGATIVE NEGATIVE   Influenza B by PCR NEGATIVE NEGATIVE   Resp Syncytial Virus by PCR NEGATIVE NEGATIVE  Culture, blood (Routine X 2) w Reflex to ID Panel   Specimen: BLOOD  Result Value Ref Range   Specimen Description BLOOD RIGHT ARM    Special Requests      BOTTLES DRAWN  AEROBIC AND ANAEROBIC Blood Culture results may not be optimal due to an inadequate volume of blood received in culture bottles   Culture      NO GROWTH 5 DAYS Performed at [facility], [address], [place], [place] [address]    Report Status 04/23/2022 FINAL   Culture, blood (Routine X 2)  w Reflex to ID Panel   Specimen: BLOOD  Result Value Ref Range   Specimen Description BLOOD  RIGHT ARM    Special Requests      BOTTLES DRAWN AEROBIC ONLY Blood Culture adequate volume   Culture      NO GROWTH 5 DAYS Performed at [facility], [address] [place], [place] [address]    Report Status 04/23/2022 FINAL   Comprehensive metabolic panel  Result Value Ref Range   Sodium 132 (L) 135 - 145 mmol/L   Potassium 3.4 (L) 3.5 - 5.1 mmol/L   Chloride 90 (L) 98 - 111 mmol/L   CO2 30 22 - 32 mmol/L   Glucose, Bld 109 (H) 70 - 99 mg/dL   BUN 16 8 - 23 mg/dL   Creatinine, Ser 0.72 0.44 - 1.00 mg/dL   Calcium 9.2 8.9 - 10.3 mg/dL   Total Protein 7.3 6.5 - 8.1 g/dL   Albumin 2.9 (L) 3.5 - 5.0 g/dL   AST 83 (H) 15 - 41 U/L   ALT 77 (H) 0 - 44 U/L   Alkaline Phosphatase 113 38 - 126 U/L   Total Bilirubin 0.7 0.3 - 1.2 mg/dL   GFR, Estimated >60 >60 mL/min   Anion gap 12 5 - 15  CBC  Result Value Ref Range   WBC 14.0 (H) 4.0 - 10.5 K/uL   RBC 3.06 (L) 3.87 - 5.11 MIL/uL   Hemoglobin 9.3 (L) 12.0 - 15.0 g/dL   HCT 28.7 (L) 36.0 - 46.0 %   MCV 93.8 80.0 - 100.0 fL   MCH 30.4 26.0 - 34.0 pg   MCHC 32.4 30.0 - 36.0 g/dL   RDW 16.1 (H) 11.5 - 15.5 %   Platelets 318 150 - 400 K/uL   nRBC 0.0 0.0 - 0.2 %  Brain natriuretic peptide  Result Value Ref Range   B Natriuretic Peptide 746.3 (H) 0.0 - 100.0 pg/mL  TSH  Result Value Ref Range   TSH 0.701 0.350 - 4.500 uIU/mL  T4, free  Result Value Ref Range   Free T4 1.31 (H) 0.61 - 1.12 ng/dL  Magnesium  Result Value Ref Range   Magnesium 2.1 1.7 - 2.4 mg/dL  Urinalysis, w/ Reflex to Culture (Infection Suspected)  -Urine, Clean Catch  Result Value Ref Range   Specimen Source URINE, RANDOM    Color, Urine YELLOW (A) YELLOW   APPearance CLEAR (A) CLEAR   Specific Gravity, Urine 1.017 1.005 - 1.030   pH 6.0 5.0 - 8.0   Glucose, UA NEGATIVE NEGATIVE mg/dL   Hgb urine dipstick NEGATIVE NEGATIVE   Bilirubin Urine NEGATIVE NEGATIVE   Ketones, ur NEGATIVE NEGATIVE mg/dL   Protein, ur 30 (A) NEGATIVE mg/dL   Nitrite NEGATIVE NEGATIVE   Leukocytes,Ua NEGATIVE NEGATIVE   WBC, UA 0-5 0 - 5 WBC/hpf   Bacteria, UA NONE SEEN NONE SEEN   Squamous Epithelial / HPF 0-5 0 - 5 /HPF  Basic metabolic panel  Result Value Ref Range   Sodium 134 (L) 135 - 145 mmol/L   Potassium 4.2 3.5 - 5.1 mmol/L   Chloride 92 (L) 98 - 111 mmol/L   CO2 30 22 - 32 mmol/L   Glucose, Bld 112 (H) 70 - 99 mg/dL   BUN 17 8 - 23 mg/dL   Creatinine, Ser 0.74 0.44 - 1.00 mg/dL   Calcium 8.7 (L) 8.9 - 10.3 mg/dL   GFR, Estimated >60 >60 mL/min   Anion gap 12 5 - 15  CBC  Result Value Ref Range   WBC 11.1 (H) 4.0 - 10.5 K/uL   RBC 2.71 (L) 3.87 - 5.11 MIL/uL   Hemoglobin 8.3 (L) 12.0 - 15.0 g/dL   HCT 25.1 (L) 36.0 - 46.0 %   MCV 92.6 80.0 - 100.0 fL   MCH 30.6 26.0 - 34.0 pg   MCHC 33.1 30.0 - 36.0 g/dL   RDW 16.0 (H) 11.5 - 15.5 %   Platelets 317 150 - 400 K/uL   nRBC 0.0 0.0 - 0.2 %  Procalcitonin - Baseline  Result Value Ref Range   Procalcitonin 0.25 ng/mL  Basic metabolic panel  Result Value Ref Range   Sodium 133 (L) 135 - 145 mmol/L   Potassium 4.7 3.5 - 5.1 mmol/L   Chloride 92 (L) 98 - 111 mmol/L   CO2 33 (H) 22 - 32 mmol/L   Glucose, Bld 140 (H) 70 - 99 mg/dL   BUN 25 (H) 8 - 23 mg/dL   Creatinine, Ser 0.70 0.44 - 1.00 mg/dL   Calcium 8.8 (L) 8.9 - 10.3 mg/dL   GFR, Estimated >60 >60 mL/min   Anion gap 8 5 - 15  CBC  Result Value Ref Range   WBC 10.5 4.0 - 10.5 K/uL   RBC 2.62 (L) 3.87 - 5.11 MIL/uL   Hemoglobin 7.9 (L) 12.0 - 15.0 g/dL   HCT 24.7 (L) 36.0 - 46.0 %   MCV 94.3 80.0 - 100.0 fL   MCH 30.2  26.0 - 34.0 pg   MCHC 32.0 30.0 - 36.0 g/dL   RDW 16.6 (H) 11.5 - 15.5 %   Platelets 354 150 - 400 K/uL   nRBC 0.0 0.0 - 0.2 %  Magnesium  Result Value Ref Range   Magnesium 2.0 1.7 - 2.4 mg/dL  Troponin I (High Sensitivity)  Result Value Ref Range   Troponin I (High Sensitivity) 45 (H) <18 ng/L  Troponin I (High Sensitivity)  Result Value Ref Range   Troponin I (High Sensitivity) 35 (H) <18 ng/L      Assessment & Plan:   Problem List Items Addressed This Visit       Cardiovascular and Mediastinum   Essential hypertension - Primary    Stable. Continue to monitor. Refills given. Call with any concerns.       Relevant Medications   amLODipine (NORVASC) 2.5 MG tablet   hydrALAZINE (APRESOLINE) 25 MG tablet   metoprolol succinate (TOPROL-XL) 50 MG 24 hr tablet     Respiratory   COPD, severe (HCC)    Stable. Continue to follow with specialists. Call with any concerns.         Other   Depression, recurrent ([place])    Under good control on current regimen. Continue current regimen. Continue to monitor. Call with any concerns. Refills given.        Relevant Medications   mirtazapine (REMERON) 45 MG tablet   Other Visit Diagnoses     Chronic right shoulder pain       Will treat with voltaren and stretches. Recheck 3 months. Call if she'd like to start PT.   Relevant Medications   mirtazapine (REMERON) 45 MG tablet        Follow up plan: Return in about 3 months (around 08/25/2022).

## 2022-05-25 NOTE — Assessment & Plan Note (Signed)
Stable. Continue to follow with specialists. Call with any concerns.

## 2022-05-25 NOTE — Assessment & Plan Note (Signed)
Stable. Continue to monitor. Refills given. Call with any concerns.  

## 2022-05-25 NOTE — Assessment & Plan Note (Signed)
Under good control on current regimen. Continue current regimen. Continue to monitor. Call with any concerns. Refills given.   

## 2022-05-27 DIAGNOSIS — J479 Bronchiectasis, uncomplicated: Secondary | ICD-10-CM | POA: Diagnosis not present

## 2022-05-27 DIAGNOSIS — Z9981 Dependence on supplemental oxygen: Secondary | ICD-10-CM | POA: Diagnosis not present

## 2022-05-27 DIAGNOSIS — J449 Chronic obstructive pulmonary disease, unspecified: Secondary | ICD-10-CM | POA: Diagnosis not present

## 2022-05-27 DIAGNOSIS — R0609 Other forms of dyspnea: Secondary | ICD-10-CM | POA: Diagnosis not present

## 2022-05-31 DIAGNOSIS — J4489 Other specified chronic obstructive pulmonary disease: Secondary | ICD-10-CM | POA: Diagnosis not present

## 2022-05-31 DIAGNOSIS — G3184 Mild cognitive impairment, so stated: Secondary | ICD-10-CM | POA: Diagnosis not present

## 2022-05-31 DIAGNOSIS — J189 Pneumonia, unspecified organism: Secondary | ICD-10-CM | POA: Diagnosis not present

## 2022-05-31 DIAGNOSIS — J439 Emphysema, unspecified: Secondary | ICD-10-CM | POA: Diagnosis not present

## 2022-05-31 DIAGNOSIS — J849 Interstitial pulmonary disease, unspecified: Secondary | ICD-10-CM | POA: Diagnosis not present

## 2022-05-31 DIAGNOSIS — J9621 Acute and chronic respiratory failure with hypoxia: Secondary | ICD-10-CM | POA: Diagnosis not present

## 2022-06-02 ENCOUNTER — Ambulatory Visit: Payer: Self-pay | Admitting: *Deleted

## 2022-06-02 NOTE — Patient Outreach (Signed)
  Care Coordination   06/02/2022 Name: Kristi Clark MRN: [id number] DOB: 1950-08-22   Care Coordination Outreach Attempts:  An unsuccessful telephone outreach was attempted for a scheduled appointment today.  Follow Up Plan:  Additional outreach attempts will be made to offer the patient care coordination information and services.   Encounter Outcome:  No Answer   Care Coordination Interventions:  No, not indicated    Valente David, RN, MSN, [facility], [place] [facility] Care Management Care Management Coordinator [phone]

## 2022-06-02 NOTE — Patient Outreach (Signed)
  Care Coordination   Follow Up Visit Note   06/02/2022 Name: Kristi Clark MRN: [id number] DOB: November 29, 1950  Kristi Clark is a 72 y.o. year old female who sees [name], DO for primary care. I spoke with Kristi Clark, daughter of Kristi Clark by phone today.  What matters to the patients health and wellness today?  Daughter report patient has been doing well, no current chest pain or discomfort.  Feels patient's mood has been down lately due to decreased independence.  Offered to have social work support, declines, stating patient would not want but will call if interested.     Goals Addressed             This Visit's Progress    Effective management of heart conditions   On track    Care Coordination Interventions: Counseled on increased risk of stroke due to Afib and benefits of anticoagulation for stroke prevention Afib action plan reviewed Provided education on low sodium diet Reviewed Heart Failure Action Plan in depth and provided written copy Advised patient to weigh each morning after emptying bladder Discussed importance of daily weight and advised patient to weigh and record daily Evaluation of current treatment plan related to hypertension self management and patient's adherence to plan as established by provider Reviewed medications with patient and discussed importance of compliance Discussed plans with patient for ongoing care management follow up and provided patient with direct contact information for care management team Reviewed scheduled/upcoming provider appointments including:  Call placed to [facility], confirmed they have started for PT/OT.  Initial visit on 2/22, next visit tomorrow Encouraged to monitor BP, weight, and HR daily, recording readings to share with provider. Per daughter, blood pressure has been "good" Appointments with cardiology on 4/2 and [facility] on 4/8           SDOH assessments and interventions completed:  No     Care  Coordination Interventions:  Yes, provided   Follow up plan: Follow up call scheduled for 4/29    Encounter Outcome:  Pt. Visit Completed   Valente David, RN, MSN, [facility] Care Management Care Management Coordinator [phone]

## 2022-06-07 ENCOUNTER — Ambulatory Visit (INDEPENDENT_AMBULATORY_CARE_PROVIDER_SITE_OTHER): Payer: Medicare Other | Admitting: Cardiovascular Disease

## 2022-06-07 ENCOUNTER — Encounter: Payer: Self-pay | Admitting: Cardiovascular Disease

## 2022-06-07 VITALS — BP 128/44 | HR 67 | Ht 59.0 in | Wt 94.0 lb

## 2022-06-07 DIAGNOSIS — I10 Essential (primary) hypertension: Secondary | ICD-10-CM

## 2022-06-07 DIAGNOSIS — I480 Paroxysmal atrial fibrillation: Secondary | ICD-10-CM | POA: Diagnosis not present

## 2022-06-07 DIAGNOSIS — I5082 Biventricular heart failure: Secondary | ICD-10-CM

## 2022-06-07 DIAGNOSIS — I2510 Atherosclerotic heart disease of native coronary artery without angina pectoris: Secondary | ICD-10-CM

## 2022-06-07 DIAGNOSIS — E782 Mixed hyperlipidemia: Secondary | ICD-10-CM

## 2022-06-07 DIAGNOSIS — I5032 Chronic diastolic (congestive) heart failure: Secondary | ICD-10-CM

## 2022-06-07 NOTE — Assessment & Plan Note (Signed)
Doing well. Denies chest pain. Shortness of breath stable.

## 2022-06-07 NOTE — Assessment & Plan Note (Signed)
Controlled. Continue same hypertensive medications.

## 2022-06-07 NOTE — Assessment & Plan Note (Signed)
Continue atorvastatin 40 mg daily.  

## 2022-06-07 NOTE — Assessment & Plan Note (Signed)
In sinus rhythm on auscultation.

## 2022-06-07 NOTE — Progress Notes (Signed)
Cardiology Office Note   Date:  06/07/2022   ID:  IRIHANNA PRESSNELL, DOB [birth year], MRN [id number]  PCP:  [name], DO  Cardiologist:  Neoma Laming, MD      History of Present Illness: Kristi Clark is a 72 y.o. female who presents for  Chief Complaint  Patient presents with   Follow-up    4 week follow up    Patient in office for 4 week follow up. Denies chest pain. Shortness of breath stable, on 3 L O2.    Past Medical History:  Diagnosis Date   AK (actinic keratosis) 10/25/2017   left forehead   Arrhythmia    atrial fibrillation   BCC (basal cell carcinoma) 10/25/2017   left upper lateral eyelid, Moh's   CHF (congestive heart failure)    COPD (chronic obstructive pulmonary disease)    Coronary artery disease    Depression    Dysrhythmia    GERD (gastroesophageal reflux disease)    Hyperlipidemia    Hypertension    Menopause    Osteopenia    Severe sepsis 07/14/2014   Tobacco abuse      Past Surgical History:  Procedure Laterality Date   CARDIAC CATHETERIZATION     CHOLECYSTECTOMY  2014   COLONOSCOPY WITH PROPOFOL N/A 01/16/2018   Procedure: COLONOSCOPY WITH PROPOFOL;  Surgeon: Lollie Sails, MD;  Location[facility] END[facility]COPY;  Service: Endoscopy;  Laterality: N/A;   CORONARY ANGIOPLASTY     ESOPHAGOGASTRODUODENOSCOPY (EGD) WITH PROPOFOL N/A 01/16/2018   Procedure: ESOPHAGOGASTRODUODENOSCOPY (EGD) WITH PROPOFOL;  Surgeon: Lollie Sails, MD;  Location[facility] ENDOSCOPY;  Service: Endoscopy;  Laterality: N/A;   heart stent     LEFT HEART CATH AND CORONARY ANGIOGRAPHY N/A 09/17/2021   Procedure: LEFT HEART CATH AND CORONARY ANGIOGRAPHY;  Surgeon: Dionisio David, MD;  Location: [place] CV LAB;  Service: Cardiovascular;  Laterality: N/A;   TEE WITHOUT CARDIOVERSION N/A 05/11/2021   Procedure: TRANSESOPHAGEAL ECHOCARDIOGRAM (TEE);  Surgeon: Corey Skains, MD;  Location: [facility] ORS;  Service: Cardiovascular;  Laterality: N/A;   TOTAL  ABDOMINAL HYSTERECTOMY      Current Outpatient Medications  Medication Sig Dispense Refill   albuterol (PROVENTIL) (2.5 MG/3ML) 0.083% nebulizer solution Take 3 mLs (2.5 mg total) by nebulization every 4 (four) hours as needed for wheezing or shortness of breath. 570 mL 1   albuterol (VENTOLIN HFA) 108 (90 Base) MCG/ACT inhaler Inhale 2 puffs into the lungs every 6 (six) hours as needed for wheezing or shortness of breath. 18 g 3   Aloe-Sodium Chloride (AYR SALINE NASAL GEL NA) Place 1 Application into the nose as needed (dry nares).     amiodarone (PACERONE) 200 MG tablet Take 1 tablet (200 mg total) by mouth daily. 30 tablet 0   amLODipine (NORVASC) 2.5 MG tablet Take 1 tablet (2.5 mg total) by mouth daily. 90 tablet 1   aspirin 81 MG tablet Take 81 mg by mouth daily.     atorvastatin (LIPITOR) 40 MG tablet Take 1 tablet (40 mg total) by mouth daily. 90 tablet 1   Blood Pressure Monitoring (BLOOD PRESSURE MONITOR/WRIST) KIT Take Blood pressure as needed, Dx: I12.9 1 kit 0   cetirizine (ZYRTEC) 10 MG tablet Take 10 mg by mouth daily.     clopidogrel (PLAVIX) 75 MG tablet Take 1 tablet (75 mg total) by mouth daily. 90 tablet 1   diclofenac Sodium (VOLTAREN) 1 % GEL Apply 4 g topically 4 (four) times daily. [place]  g 3   feeding supplement (ENSURE ENLIVE / ENSURE PLUS) LIQD Take 237 mLs by mouth 2 (two) times daily between meals. 237 mL 12   ferrous sulfate 325 (65 FE) MG tablet Take 1 tablet (325 mg total) by mouth daily with breakfast. 30 tablet 3   Fluticasone-Umeclidin-Vilant (TRELEGY ELLIPTA) 100-62.5-25 MCG/ACT AEPB 100 mcg DAILY (route: inhalation)     furosemide (LASIX) 20 MG tablet Take 1 tablet (20 mg total) by mouth daily. 5 tablet 0   hydrALAZINE (APRESOLINE) 25 MG tablet Take 0.5 tablets (12.5 mg total) by mouth 2 (two) times daily. 90 tablet 1   hydrochlorothiazide (MICROZIDE) 12.5 MG capsule Take 1 capsule (12.5 mg total) by mouth daily as needed. 30 capsule 3   isosorbide mononitrate  (IMDUR) 60 MG 24 hr tablet Take 1 tablet (60 mg total) by mouth 2 (two) times daily. 90 tablet 1   losartan (COZAAR) 25 MG tablet Take 1 tablet (25 mg total) by mouth daily. 90 tablet 1   metoprolol succinate (TOPROL-XL) 50 MG 24 hr tablet Take 1 tablet (50 mg total) by mouth daily. Take with or immediately following a meal. 90 tablet 1   mirtazapine (REMERON) 45 MG tablet Take 1 tablet (45 mg total) by mouth at bedtime. 90 tablet 1   Multiple Vitamin (MULTIVITAMIN WITH MINERALS) TABS tablet Take 1 tablet by mouth daily.     nystatin (MYCOSTATIN) 100000 UNIT/ML suspension Take 5 mLs (500,000 Units total) by mouth 4 (four) times daily. 120 mL 0   pantoprazole (PROTONIX) 40 MG tablet Take 1 tablet (40 mg total) by mouth 2 (two) times daily. 60 tablet 9   polyethylene glycol powder (GLYCOLAX/MIRALAX) 17 GM/SCOOP powder Take 17 g by mouth 3 (three) times daily. 3350 g 3   QUEtiapine (SEROQUEL XR) 50 MG TB24 24 hr tablet Take 2 tablets (100 mg total) by mouth at bedtime. 180 tablet 1   No current facility-administered medications for this visit.    Allergies:   Clindamycin/lincomycin, Prednisone, Amoxicillin, Avelox [moxifloxacin hcl in nacl], Codeine sulfate, Penicillins, and Sulfa antibiotics    Social History:   reports that she has quit smoking. Her smoking use included cigarettes. She has a 56.00 pack-year smoking history. She has never used smokeless tobacco. She reports that she does not drink alcohol and does not use drugs.   Family History:  family history includes Asthma in her son; Breast cancer in her paternal aunt; Breast cancer (age of onset: 79) in her sister; Cancer in her maternal grandmother, mother, sister, and son; Diabetes in her brother, brother, daughter, and sister; Heart disease in her brother and father; Hyperlipidemia in her father; Hypertension in her daughter; Stroke in her mother.    ROS:     Review of Systems  Constitutional: Negative.   HENT: Negative.    Eyes:  Negative.   Respiratory: Negative.    Cardiovascular: Negative.   Gastrointestinal: Negative.   Genitourinary: Negative.   Musculoskeletal: Negative.   Skin: Negative.   Neurological: Negative.   Endo/Heme/Allergies: Negative.   Psychiatric/Behavioral: Negative.    All other systems reviewed and are negative.   All other systems are reviewed and negative.   PHYSICAL EXAM: VS:  BP (!) 128/44 (BP Location: Right Arm, Patient Position: Sitting, Cuff Size: Small)   Pulse 67   Ht 4\' 11"  (1.499 m)   Wt 94 lb (42.6 kg)   LMP  (LMP Unknown)   SpO2 91%   BMI 18.99 kg/m  , BMI Body mass index  is 18.99 kg/m. Last weight:  Wt Readings from Last 3 Encounters:  06/07/22 94 lb (42.6 kg)  05/25/22 94 lb 6.4 oz (42.8 kg)  05/10/22 90 lb 3.2 oz (40.9 kg)    Physical Exam Constitutional:      Appearance: Normal appearance.  Cardiovascular:     Rate and Rhythm: Normal rate and regular rhythm.     Heart sounds: Normal heart sounds.  Pulmonary:     Effort: Pulmonary effort is normal.     Breath sounds: Normal breath sounds.  Musculoskeletal:     Right lower leg: No edema.     Left lower leg: No edema.  Neurological:     Mental Status: She is alert.      EKG: none today  Recent Labs: 04/18/2022: ALT 77; B Natriuretic Peptide 746.3; TSH 0.701 04/21/2022: BUN 25; Creatinine, Ser 0.70; Hemoglobin 7.9; Magnesium 2.0; Platelets 354; Potassium 4.7; Sodium 133    Lipid Panel    Component Value Date/Time   CHOL 100 01/15/2022 0640   CHOL 107 08/16/2021 1404   CHOL 148 03/06/2015 0831   TRIG 54 01/15/2022 0640   TRIG 100 03/06/2015 0831   HDL 48 01/15/2022 0640   HDL 49 08/16/2021 1404   CHOLHDL 2.1 01/15/2022 0640   VLDL 11 01/15/2022 0640   VLDL 20 03/06/2015 0831   LDLCALC 41 01/15/2022 0640   LDLCALC 42 08/16/2021 1404     Other studies Reviewed: none today   ASSESSMENT AND PLAN:    ICD-10-CM   1. Coronary artery disease involving native coronary artery of native heart  without angina pectoris  I25.10     2. Essential hypertension  I10     3. Paroxysmal A-fib  I48.0     4. Mixed hyperlipidemia  E78.2     5. 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  A999333    I50.32        Problem List Items Addressed This Visit       Cardiovascular and Mediastinum   Essential hypertension    Controlled. Continue same hypertensive medications.       CAD (coronary artery disease) - Primary    Doing well. Denies chest pain. Shortness of breath stable.       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   Paroxysmal A-fib    In sinus rhythm on auscultation.         Other   Hyperlipidemia    Continue atorvastatin 40 mg daily.         Disposition:   Return in about 2 months (around 08/07/2022).    Total time spent: 30 minutes  Signed,  Neoma Laming, MD  06/07/2022 10:29 [place]

## 2022-06-08 DIAGNOSIS — G3184 Mild cognitive impairment, so stated: Secondary | ICD-10-CM | POA: Diagnosis not present

## 2022-06-08 DIAGNOSIS — J4489 Other specified chronic obstructive pulmonary disease: Secondary | ICD-10-CM | POA: Diagnosis not present

## 2022-06-08 DIAGNOSIS — J849 Interstitial pulmonary disease, unspecified: Secondary | ICD-10-CM | POA: Diagnosis not present

## 2022-06-08 DIAGNOSIS — J189 Pneumonia, unspecified organism: Secondary | ICD-10-CM | POA: Diagnosis not present

## 2022-06-08 DIAGNOSIS — J9621 Acute and chronic respiratory failure with hypoxia: Secondary | ICD-10-CM | POA: Diagnosis not present

## 2022-06-08 DIAGNOSIS — J439 Emphysema, unspecified: Secondary | ICD-10-CM | POA: Diagnosis not present

## 2022-06-10 DIAGNOSIS — J439 Emphysema, unspecified: Secondary | ICD-10-CM | POA: Diagnosis not present

## 2022-06-10 DIAGNOSIS — J9621 Acute and chronic respiratory failure with hypoxia: Secondary | ICD-10-CM | POA: Diagnosis not present

## 2022-06-10 DIAGNOSIS — I5033 Acute on chronic diastolic (congestive) heart failure: Secondary | ICD-10-CM | POA: Diagnosis not present

## 2022-06-10 DIAGNOSIS — J189 Pneumonia, unspecified organism: Secondary | ICD-10-CM | POA: Diagnosis not present

## 2022-06-10 DIAGNOSIS — I480 Paroxysmal atrial fibrillation: Secondary | ICD-10-CM | POA: Diagnosis not present

## 2022-06-10 DIAGNOSIS — J849 Interstitial pulmonary disease, unspecified: Secondary | ICD-10-CM | POA: Diagnosis not present

## 2022-06-10 DIAGNOSIS — F32A Depression, unspecified: Secondary | ICD-10-CM | POA: Diagnosis not present

## 2022-06-10 DIAGNOSIS — R413 Other amnesia: Secondary | ICD-10-CM | POA: Diagnosis not present

## 2022-06-10 DIAGNOSIS — N182 Chronic kidney disease, stage 2 (mild): Secondary | ICD-10-CM | POA: Diagnosis not present

## 2022-06-10 DIAGNOSIS — R1310 Dysphagia, unspecified: Secondary | ICD-10-CM | POA: Diagnosis not present

## 2022-06-10 DIAGNOSIS — I2510 Atherosclerotic heart disease of native coronary artery without angina pectoris: Secondary | ICD-10-CM | POA: Diagnosis not present

## 2022-06-10 DIAGNOSIS — R911 Solitary pulmonary nodule: Secondary | ICD-10-CM | POA: Diagnosis not present

## 2022-06-10 DIAGNOSIS — D631 Anemia in chronic kidney disease: Secondary | ICD-10-CM | POA: Diagnosis not present

## 2022-06-10 DIAGNOSIS — E440 Moderate protein-calorie malnutrition: Secondary | ICD-10-CM | POA: Diagnosis not present

## 2022-06-10 DIAGNOSIS — R296 Repeated falls: Secondary | ICD-10-CM | POA: Diagnosis not present

## 2022-06-10 DIAGNOSIS — J4489 Other specified chronic obstructive pulmonary disease: Secondary | ICD-10-CM | POA: Diagnosis not present

## 2022-06-10 DIAGNOSIS — G3184 Mild cognitive impairment, so stated: Secondary | ICD-10-CM | POA: Diagnosis not present

## 2022-06-10 DIAGNOSIS — E785 Hyperlipidemia, unspecified: Secondary | ICD-10-CM | POA: Diagnosis not present

## 2022-06-10 DIAGNOSIS — E871 Hypo-osmolality and hyponatremia: Secondary | ICD-10-CM | POA: Diagnosis not present

## 2022-06-10 DIAGNOSIS — J309 Allergic rhinitis, unspecified: Secondary | ICD-10-CM | POA: Diagnosis not present

## 2022-06-10 DIAGNOSIS — I700 Atherosclerosis of aorta: Secondary | ICD-10-CM | POA: Diagnosis not present

## 2022-06-10 DIAGNOSIS — R54 Age-related physical debility: Secondary | ICD-10-CM | POA: Diagnosis not present

## 2022-06-10 DIAGNOSIS — I130 Hypertensive heart and chronic kidney disease with heart failure and stage 1 through stage 4 chronic kidney disease, or unspecified chronic kidney disease: Secondary | ICD-10-CM | POA: Diagnosis not present

## 2022-06-10 DIAGNOSIS — R634 Abnormal weight loss: Secondary | ICD-10-CM | POA: Diagnosis not present

## 2022-06-10 DIAGNOSIS — K219 Gastro-esophageal reflux disease without esophagitis: Secondary | ICD-10-CM | POA: Diagnosis not present

## 2022-06-13 ENCOUNTER — Ambulatory Visit (INDEPENDENT_AMBULATORY_CARE_PROVIDER_SITE_OTHER): Payer: Medicare Other

## 2022-06-13 VITALS — Ht 59.0 in | Wt 94.0 lb

## 2022-06-13 DIAGNOSIS — Z0000 Encounter for general adult medical examination without abnormal findings: Secondary | ICD-10-CM | POA: Diagnosis not present

## 2022-06-13 NOTE — Progress Notes (Signed)
I connected with  JASE HIMMELBERGER on 06/13/22 by a audio enabled telemedicine application and verified that I am speaking with the correct person using two identifiers. Spoke w/ her daughter Ivin Booty  Patient Location: Home  Provider Location: Office/Clinic  I discussed the limitations of evaluation and management by telemedicine. The patient expressed understanding and agreed to proceed.  Subjective:   Kristi Clark is a 72 y.o. female who presents for Medicare Annual (Subsequent) preventive examination.  Review of Systems     Cardiac Risk Factors include: advanced age (>17men, >49 women);hypertension;smoking/ tobacco exposure     Objective:    There were no vitals filed for this visit. There is no height or weight on file to calculate BMI.     06/13/2022    9:37 AM 04/18/2022    8:47 PM 04/18/2022    4:48 PM 03/05/2022    6:00 PM 03/04/2022    6:35 AM 01/13/2022    3:49 AM 09/17/2021   11:20 AM  Advanced Directives  Does Patient Have a Medical Advance Directive? Yes Yes No No No No Yes  Type of Estate agent of [place];Living will Living will;Healthcare Power of Attorney       Does patient want to make changes to medical advance directive? No - Patient declined No - Patient declined     No - Patient declined  Copy of Healthcare Power of Attorney in Chart? Yes - validated most recent copy scanned in chart (See row information) No - copy requested       Would patient like information on creating a medical advance directive?    No - Patient declined  No - Patient declined     Current Medications (verified) Outpatient Encounter Medications as of 06/13/2022  Medication Sig   albuterol (PROVENTIL) (2.5 MG/3ML) 0.083% nebulizer solution Take 3 mLs (2.5 mg total) by nebulization every 4 (four) hours as needed for wheezing or shortness of breath.   albuterol (VENTOLIN HFA) 108 (90 Base) MCG/ACT inhaler Inhale 2 puffs into the lungs every 6 (six) hours as  needed for wheezing or shortness of breath.   Aloe-Sodium Chloride (AYR SALINE NASAL GEL NA) Place 1 Application into the nose as needed (dry nares).   amiodarone (PACERONE) 200 MG tablet Take 1 tablet (200 mg total) by mouth daily.   amLODipine (NORVASC) 2.5 MG tablet Take 1 tablet (2.5 mg total) by mouth daily.   aspirin 81 MG tablet Take 81 mg by mouth daily.   atorvastatin (LIPITOR) 40 MG tablet Take 1 tablet (40 mg total) by mouth daily.   Blood Pressure Monitoring (BLOOD PRESSURE MONITOR/WRIST) KIT Take Blood pressure as needed, Dx: I12.9   cetirizine (ZYRTEC) 10 MG tablet Take 10 mg by mouth daily.   clopidogrel (PLAVIX) 75 MG tablet Take 1 tablet (75 mg total) by mouth daily.   diclofenac Sodium (VOLTAREN) 1 % GEL Apply 4 g topically 4 (four) times daily.   feeding supplement (ENSURE ENLIVE / ENSURE PLUS) LIQD Take 237 mLs by mouth 2 (two) times daily between meals.   ferrous sulfate 325 (65 FE) MG tablet Take 1 tablet (325 mg total) by mouth daily with breakfast.   Fluticasone-Umeclidin-Vilant (TRELEGY ELLIPTA) 100-62.5-25 MCG/ACT AEPB 100 mcg DAILY (route: inhalation)   furosemide (LASIX) 20 MG tablet Take 1 tablet (20 mg total) by mouth daily.   hydrALAZINE (APRESOLINE) 25 MG tablet Take 0.5 tablets (12.5 mg total) by mouth 2 (two) times daily.   hydrochlorothiazide (MICROZIDE) 12.5 MG capsule  Take 1 capsule (12.5 mg total) by mouth daily as needed.   isosorbide mononitrate (IMDUR) 60 MG 24 hr tablet Take 1 tablet (60 mg total) by mouth 2 (two) times daily.   losartan (COZAAR) 25 MG tablet Take 1 tablet (25 mg total) by mouth daily.   metoprolol succinate (TOPROL-XL) 50 MG 24 hr tablet Take 1 tablet (50 mg total) by mouth daily. Take with or immediately following a meal.   mirtazapine (REMERON) 45 MG tablet Take 1 tablet (45 mg total) by mouth at bedtime.   Multiple Vitamin (MULTIVITAMIN WITH MINERALS) TABS tablet Take 1 tablet by mouth daily.   nystatin (MYCOSTATIN) 100000 UNIT/ML  suspension Take 5 mLs (500,000 Units total) by mouth 4 (four) times daily.   pantoprazole (PROTONIX) 40 MG tablet Take 1 tablet (40 mg total) by mouth 2 (two) times daily.   polyethylene glycol powder (GLYCOLAX/MIRALAX) 17 GM/SCOOP powder Take 17 g by mouth 3 (three) times daily.   QUEtiapine (SEROQUEL XR) 50 MG TB24 24 hr tablet Take 2 tablets (100 mg total) by mouth at bedtime.   No facility-administered encounter medications on file as of 06/13/2022.    Allergies (verified) Clindamycin/lincomycin, Prednisone, Amoxicillin, Avelox [moxifloxacin hcl in nacl], Codeine sulfate, Penicillins, and Sulfa antibiotics   History: Past Medical History:  Diagnosis Date   AK (actinic keratosis) 10/25/2017   left forehead   Arrhythmia    atrial fibrillation   BCC (basal cell carcinoma) 10/25/2017   left upper lateral eyelid, Moh's   CHF (congestive heart failure)    COPD (chronic obstructive pulmonary disease)    Coronary artery disease    Depression    Dysrhythmia    GERD (gastroesophageal reflux disease)    Hyperlipidemia    Hypertension    Menopause    Osteopenia    Severe sepsis 07/14/2014   Tobacco abuse    Past Surgical History:  Procedure Laterality Date   CARDIAC CATHETERIZATION     CHOLECYSTECTOMY  2014   COLONOSCOPY WITH PROPOFOL N/A 01/16/2018   Procedure: COLONOSCOPY WITH PROPOFOL;  Surgeon: Christena Deem, MD;  Location: [facility] ENDOSCOPY;  Service: Endoscopy;  Laterality: N/A;   CORONARY ANGIOPLASTY     ESOPHAGOGASTRODUODENOSCOPY (EGD) WITH PROPOFOL N/A 01/16/2018   Procedure: ESOPHAGOGASTRODUODENOSCOPY (EGD) WITH PROPOFOL;  Surgeon: Christena Deem, MD;  Location[facility] ENDOSCOPY;  Service: Endoscopy;  Laterality: N/A;   heart stent     LEFT HEART CATH AND CORONARY ANGIOGRAPHY N/A 09/17/2021   Procedure: LEFT HEART CATH AND CORONARY ANGIOGRAPHY;  Surgeon: Laurier Nancy, MD;  Location: [facility] INVASIVE CV LAB;  Service: Cardiovascular;  Laterality: N/A;   TEE WITHOUT  CARDIOVERSION N/A 05/11/2021   Procedure: TRANSESOPHAGEAL ECHOCARDIOGRAM (TEE);  Surgeon: Lamar Blinks, MD;  Location: [facility] ORS;  Service: Cardiovascular;  Laterality: N/A;   TOTAL ABDOMINAL HYSTERECTOMY     Family History  Problem Relation Age of Onset   Cancer Mother    Stroke Mother    Heart disease Father    Hyperlipidemia Father    Cancer Sister    Diabetes Sister    Breast cancer Sister 38   Diabetes Brother    Asthma Son    Cancer Son    Diabetes Daughter    Hypertension Daughter    Cancer Maternal Grandmother        gallbladder   Diabetes Brother    Heart disease Brother    Breast cancer Paternal Aunt    Social History   Socioeconomic History   Marital status: Widowed  Spouse name: Not on file   Number of children: 2   Years of education: Not on file   Highest education level: 10th grade  Occupational History   Occupation: retired  Tobacco Use   Smoking status: Former    Packs/day: 1.00    Years: 56.00    Additional pack years: 0.00    Total pack years: 56.00    Types: Cigarettes   Smokeless tobacco: Never  Vaping Use   Vaping Use: Never used  Substance and Sexual Activity   Alcohol use: No   Drug use: Never   Sexual activity: Never  Other Topics Concern   Not on file  Social History Narrative   ** Merged History Encounter **       Lives with great grandson, Charise Killian   Social Determinants of Health   Financial Resource Strain: Low Risk  (06/13/2022)   Overall Financial Resource Strain (CARDIA)    Difficulty of Paying Living Expenses: Not very hard  Food Insecurity: No Food Insecurity (06/13/2022)   Hunger Vital Sign    Worried About Running Out of Food in the Last Year: Never true    Ran Out of Food in the Last Year: Never true  Recent Concern: Food Insecurity - Food Insecurity Present (04/18/2022)   Hunger Vital Sign    Worried About Running Out of Food in the Last Year: Never true    Ran Out of Food in the Last Year: Sometimes true   Transportation Needs: No Transportation Needs (06/13/2022)   PRAPARE - Administrator, Civil Service (Medical): No    Lack of Transportation (Non-Medical): No  Physical Activity: Insufficiently Active (06/13/2022)   Exercise Vital Sign    Days of Exercise per Week: 3 days    Minutes of Exercise per Session: 30 min  Stress: No Stress Concern Present (06/13/2022)   [employer] of Occupational Health - Occupational Stress Questionnaire    Feeling of Stress : Only a little  Social Connections: Socially Isolated (06/13/2022)   Social Connection and Isolation Panel [NHANES]    Frequency of Communication with Friends and Family: More than three times a week    Frequency of Social Gatherings with Friends and Family: More than three times a week    Attends Religious Services: Never    Database administrator or Organizations: No    Attends Banker Meetings: Never    Marital Status: Widowed    Tobacco Counseling Counseling given: Not Answered   Clinical Intake:  Pre-visit preparation completed: Yes  Pain : No/denies pain     Nutritional Risks: None Diabetes: No  How often do you need to have someone help you when you read instructions, pamphlets, or other written materials from your doctor or pharmacy?: 1 - Never  Diabetic?no  Interpreter Needed?: No  Information entered by :: Kennedy Bucker, LPN   Activities of Daily Living    06/13/2022    9:38 AM 04/18/2022    8:47 PM  In your present state of health, do you have any difficulty performing the following activities:  Hearing? 0 1  Vision? 0 1  Difficulty concentrating or making decisions? 0 1  Walking or climbing stairs? 1 1  Dressing or bathing? 0 1  Doing errands, shopping? 1 0  Preparing Food and eating ? N   Using the Toilet? N   In the past six months, have you accidently leaked urine? N   Do you have problems with loss of  bowel control? N   Managing your Medications? Y   Managing your  Finances? Y   Housekeeping or managing your Housekeeping? Y     Patient Care Team: Dorcas Carrow, DO as PCP - General (Family Medicine) Yevonne Pax, MD as Consulting Physician (Cardiology) Mertie Moores, MD as Referring Physician (Pulmonary Disease) Sandi Mealy, MD as Consulting Physician (Dermatology) Kemper Durie, RN as [facility] Laural Benes, Oralia Rud, DO (Family Medicine)  Indicate any recent Medical Services you may have received from other than [facility] providers in the past year (date may be approximate).     Assessment:   This is a routine wellness examination for [place].  Hearing/Vision screen Hearing Screening - Comments:: No aids Vision Screening - Comments:: Wears glasses- [facility] [employer]  Dietary issues and exercise activities discussed: Current Exercise Habits: Home exercise routine, Type of exercise: stretching, Time (Minutes): 30, Frequency (Times/Week): 3, Weekly Exercise (Minutes/Week): 90, Intensity: Mild   Goals Addressed             This Visit's Progress    DIET - EAT MORE FRUITS AND VEGETABLES         Depression Screen    06/13/2022    9:34 AM 05/25/2022    8:43 AM 04/27/2022   11:31 AM 04/01/2022    2:15 PM 03/03/2022    2:12 PM 02/21/2022    1:43 PM 02/10/2022    3:20 PM  PHQ 2/9 Scores  PHQ - 2 Score 1 3 6 1 3 4 6   PHQ- 9 Score 5 5 26 7 12 16 24     Fall Risk    06/13/2022    9:38 AM 05/25/2022    8:43 AM 04/01/2022    2:15 PM 03/03/2022    2:12 PM 02/21/2022    1:43 PM  Fall Risk   Falls in the past year? 1 1 1 1  0  Number falls in past yr: 1 1 1  0 0  Injury with Fall? 1 1 1 1  0  Risk for fall due to : History of fall(s) History of fall(s);Impaired mobility History of fall(s);Impaired mobility;Impaired balance/gait History of fall(s) No Fall Risks  Follow up Falls evaluation completed;Falls prevention discussed Falls evaluation completed Falls evaluation completed Falls evaluation completed  Falls evaluation completed    FALL RISK PREVENTION PERTAINING TO THE HOME:  Any stairs in or around the home? Yes  If so, are there any without handrails? No  Home free of loose throw rugs in walkways, pet beds, electrical cords, etc? Yes  Adequate lighting in your home to reduce risk of falls? Yes   ASSISTIVE DEVICES UTILIZED TO PREVENT FALLS:  Life alert? No  Use of a cane, walker or w/c? Yes - walker Grab bars in the bathroom? Yes  Shower chair or bench in shower? No  Elevated toilet seat or a handicapped toilet? Yes    Cognitive Function:pt unavailable     01/04/2022    3:27 PM  MMSE - Mini Mental State Exam  Orientation to time 5  Orientation to Place 5  Registration 3  Attention/ Calculation 5  Recall 3  Language- name 2 objects 2  Language- repeat 1  Language- follow 3 step command 3  Language- read & follow direction 1  Write a sentence 1  Copy design 1  Total score 30        05/29/2020    1:54 PM 05/16/2019    1:39 PM 11/15/2018    2:08  PM 10/11/2017    2:14 PM  6CIT Screen  What Year? 0 points 0 points 0 points 0 points  What month? 0 points 0 points 0 points 0 points  What time? 0 points 0 points 0 points 0 points  Count back from 20 0 points 0 points 0 points 0 points  Months in reverse 0 points 0 points 0 points 0 points  Repeat phrase 0 points 0 points 0 points 0 points  Total Score 0 points 0 points 0 points 0 points    Immunizations Immunization History  Administered Date(s) Administered   Fluad Quad(high Dose 65+) 11/15/2018, 11/18/2019, 12/09/2020, 11/29/2021   Influenza, High Dose Seasonal PF 01/04/2016, 12/23/2016, 12/25/2017   Influenza,inj,Quad PF,6+ Mos 02/11/2015   Influenza-Unspecified 12/13/2013   PFIZER Comirnaty(Gray Top)Covid-19 Tri-Sucrose Vaccine 05/01/2019, 05/22/2019   PFIZER(Purple Top)SARS-COV-2 Vaccination 05/01/2019, 05/22/2019, 02/06/2020   Pneumococcal Conjugate-13 08/14/2013   Pneumococcal Polysaccharide-23 03/04/2014    Td 12/03/1994, 01/06/2021   Zoster, Live 02/07/2011    TDAP status: Up to date  Flu Vaccine status: Up to date  Pneumococcal vaccine status: Up to date  Covid-19 vaccine status: Completed vaccines  Qualifies for Shingles Vaccine? Yes   Zostavax completed Yes   Shingrix Completed?: No.    Education has been provided regarding the importance of this vaccine. Patient has been advised to call insurance company to determine out of pocket expense if they have not yet received this vaccine. Advised may also receive vaccine at local pharmacy or Health Dept. Verbalized acceptance and understanding.  Screening Tests Health Maintenance  Topic Date Due   Zoster Vaccines- Shingrix (1 of 2) Never done   COVID-19 Vaccine (6 - 2023-24 season) 11/05/2021   INFLUENZA VACCINE  10/06/2022   MAMMOGRAM  12/17/2022   COLONOSCOPY (Pts 45-88yrs Insurance coverage will need to be confirmed)  01/17/2023   Lung Cancer Screening  04/19/2023   Medicare Annual Wellness (AWV)  06/13/2023   DTaP/Tdap/Td (3 - Tdap) 01/07/2031   Pneumonia Vaccine 13+ Years old  Completed   DEXA SCAN  Completed   Hepatitis C Screening  Completed   HPV VACCINES  Aged Out    Health Maintenance  Health Maintenance Due  Topic Date Due   Zoster Vaccines- Shingrix (1 of 2) Never done   COVID-19 Vaccine (6 - 2023-24 season) 11/05/2021    Colorectal cancer screening: Type of screening: Colonoscopy. Completed 01/16/18. Repeat every 5 years  Mammogram status: Completed 12/16/21. Repeat every year  Bone Density status: Completed 08/22/12. Results reflect: Bone density results: OSTEOPOROSIS. Repeat every 2 years.- over due  Lung Cancer Screening: (Low Dose CT Chest recommended if Age 68-80 years, 30 pack-year currently smoking OR have quit w/in 15years.) does qualify.   Lung Cancer Screening Referral: had one 04/18/22  Additional Screening:  Hepatitis C Screening: does qualify; Completed 09/15/15  Vision Screening:  Recommended annual ophthalmology exams for early detection of glaucoma and other disorders of the eye. Is the patient up to date with their annual eye exam?  Yes  Who is the provider or what is the name of the office in which the patient attends annual eye exams? [facility]  If pt is not established with a provider, would they like to be referred to a provider to establish care? No .   Dental Screening: Recommended annual dental exams for proper oral hygiene  Community Resource Referral / Chronic Care Management: CRR required this visit?  No   CCM required this visit?  No  Plan:     I have personally reviewed and noted the following in the patient's chart:   Medical and social history Use of alcohol, tobacco or illicit drugs  Current medications and supplements including opioid prescriptions. Patient is not currently taking opioid prescriptions. Functional ability and status Nutritional status Physical activity Advanced directives List of other physicians Hospitalizations, surgeries, and ER visits in previous 12 months Vitals Screenings to include cognitive, depression, and falls Referrals and appointments  In addition, I have reviewed and discussed with patient certain preventive protocols, quality metrics, and best practice recommendations. A written personalized care plan for preventive services as well as general preventive health recommendations were provided to patient.     Hal HopeLorrie S Minela [place], LPN   4/0/98114/10/2022   Nurse Notes: none

## 2022-06-13 NOTE — Patient Instructions (Signed)
[name] , Thank you for taking time to come for your Medicare Wellness Visit. I appreciate your ongoing commitment to your health goals. Please review the following plan we discussed and let me know if I can assist you in the future.   These are the goals we discussed:  Goals      DIET - EAT MORE FRUITS AND VEGETABLES     Effective management of heart conditions     Care Coordination Interventions: Counseled on increased risk of stroke due to Afib and benefits of anticoagulation for stroke prevention Afib action plan reviewed Provided education on low sodium diet Reviewed Heart Failure Action Plan in depth and provided written copy Advised patient to weigh each morning after emptying bladder Discussed importance of daily weight and advised patient to weigh and record daily Evaluation of current treatment plan related to hypertension self management and patient's adherence to plan as established by provider Reviewed medications with patient and discussed importance of compliance Discussed plans with patient for ongoing care management follow up and provided patient with direct contact information for care management team Reviewed scheduled/upcoming provider appointments including:  Call placed to [facility], confirmed they have started for PT/OT.  Initial visit on 2/22, next visit tomorrow Encouraged to monitor BP, weight, and HR daily, recording readings to share with provider. Per daughter, blood pressure has been "good" Appointments with cardiology on 4/2 and AWV on 4/8        Patient Stated     05/29/2020, quit smoking and eat healthier     Patient Stated     Quit smoking     Prevent recurrent fall     Care Coordination Interventions: Provided written and verbal education re: potential causes of falls and Fall prevention strategies Advised patient of importance of notifying provider of falls Assessed for falls since last encounter Advised patient to discuss Recent fall with  provider Discussed need to be evaluated for fractures.  Kristi Clark took her to Urgent Care, but patient refused to wait and refused to be seen at the ED.  State she is fearful that she will have to be admitted and "won't come out." Kristi Clark verbalizes understanding of importance of evaluation, will continue to discuss with patient. Kristi Clark will discuss recent fall with home health nurse that will visit todat. Appointment with PCP on 12/28       Quit Smoking     Smoking cessation discussed        This is a list of the screening recommended for you and due dates:  Health Maintenance  Topic Date Due   Zoster (Shingles) Vaccine (1 of 2) Never done   COVID-19 Vaccine (6 - 2023-24 season) 11/05/2021   Flu Shot  10/06/2022   Mammogram  12/17/2022   Colon Cancer Screening  01/17/2023   Screening for Lung Cancer  04/19/2023   Medicare Annual Wellness Visit  06/13/2023   DTaP/Tdap/Td vaccine (3 - Tdap) 01/07/2031   Pneumonia Vaccine  Completed   DEXA scan (bone density measurement)  Completed   Hepatitis C Screening: USPSTF Recommendation to screen - Ages 4418-79 yo.  Completed   HPV Vaccine  Aged Out    Advanced directives: yes  Conditions/risks identified: none  Next appointment: Follow up in one year for your annual wellness visit 06/19/23 @ 9:15 am by phone   Preventive Care 65 Years and Older, Female Preventive care refers to lifestyle choices and visits with your health care provider that can promote health and wellness. What does preventive  care include? A yearly physical exam. This is also called an annual well check. Dental exams once or twice a year. Routine eye exams. Ask your health care provider how often you should have your eyes checked. Personal lifestyle choices, including: Daily care of your teeth and gums. Regular physical activity. Eating a healthy diet. Avoiding tobacco and drug use. Limiting alcohol use. Practicing safe sex. Taking low-dose aspirin every day. Taking  vitamin and mineral supplements as recommended by your health care provider. What happens during an annual well check? The services and screenings done by your health care provider during your annual well check will depend on your age, overall health, lifestyle risk factors, and family history of disease. Counseling  Your health care provider may ask you questions about your: Alcohol use. Tobacco use. Drug use. Emotional well-being. Home and relationship well-being. Sexual activity. Eating habits. History of falls. Memory and ability to understand (cognition). Work and work Astronomer. Reproductive health. Screening  You may have the following tests or measurements: Height, weight, and BMI. Blood pressure. Lipid and cholesterol levels. These may be checked every 5 years, or more frequently if you are over 56 years old. Skin check. Lung cancer screening. You may have this screening every year starting at age 39 if you have a 30-pack-year history of smoking and currently smoke or have quit within the past 15 years. Fecal occult blood test (FOBT) of the stool. You may have this test every year starting at age 50. Flexible sigmoidoscopy or colonoscopy. You may have a sigmoidoscopy every 5 years or a colonoscopy every 10 years starting at age 76. Hepatitis C blood test. Hepatitis B blood test. Sexually transmitted disease (STD) testing. Diabetes screening. This is done by checking your blood sugar (glucose) after you have not eaten for a while (fasting). You may have this done every 1-3 years. Bone density scan. This is done to screen for osteoporosis. You may have this done starting at age 55. Mammogram. This may be done every 1-2 years. Talk to your health care provider about how often you should have regular mammograms. Talk with your health care provider about your test results, treatment options, and if necessary, the need for more tests. Vaccines  Your health care provider may  recommend certain vaccines, such as: Influenza vaccine. This is recommended every year. Tetanus, diphtheria, and acellular pertussis (Tdap, Td) vaccine. You may need a Td booster every 10 years. Zoster vaccine. You may need this after age 33. Pneumococcal 13-valent conjugate (PCV13) vaccine. One dose is recommended after age 109. Pneumococcal polysaccharide (PPSV23) vaccine. One dose is recommended after age 82. Talk to your health care provider about which screenings and vaccines you need and how often you need them. This information is not intended to replace advice given to you by your health care provider. Make sure you discuss any questions you have with your health care provider. Document Released: 03/20/2015 Document Revised: 11/11/2015 Document Reviewed: 12/23/2014 Elsevier Interactive Patient Education  2017 [employer].  Fall Prevention in the Home Falls can cause injuries. They can happen to people of all ages. There are many things you can do to make your home safe and to help prevent falls. What can I do on the outside of my home? Regularly fix the edges of walkways and driveways and fix any cracks. Remove anything that might make you trip as you walk through a door, such as a raised step or threshold. Trim any bushes or trees on the path to your  home. Use bright outdoor lighting. Clear any walking paths of anything that might make someone trip, such as rocks or tools. Regularly check to see if handrails are loose or broken. Make sure that both sides of any steps have handrails. Any raised decks and porches should have guardrails on the edges. Have any leaves, snow, or ice cleared regularly. Use sand or salt on walking paths during winter. Clean up any spills in your garage right away. This includes oil or grease spills. What can I do in the bathroom? Use night lights. Install grab bars by the toilet and in the tub and shower. Do not use towel bars as grab bars. Use non-skid  mats or decals in the tub or shower. If you need to sit down in the shower, use a plastic, non-slip stool. Keep the floor dry. Clean up any water that spills on the floor as soon as it happens. Remove soap buildup in the tub or shower regularly. Attach bath mats securely with double-sided non-slip rug tape. Do not have throw rugs and other things on the floor that can make you trip. What can I do in the bedroom? Use night lights. Make sure that you have a light by your bed that is easy to reach. Do not use any sheets or blankets that are too big for your bed. They should not hang down onto the floor. Have a firm chair that has side arms. You can use this for support while you get dressed. Do not have throw rugs and other things on the floor that can make you trip. What can I do in the kitchen? Clean up any spills right away. Avoid walking on wet floors. Keep items that you use a lot in easy-to-reach places. If you need to reach something above you, use a strong step stool that has a grab bar. Keep electrical cords out of the way. Do not use floor polish or wax that makes floors slippery. If you must use wax, use non-skid floor wax. Do not have throw rugs and other things on the floor that can make you trip. What can I do with my stairs? Do not leave any items on the stairs. Make sure that there are handrails on both sides of the stairs and use them. Fix handrails that are broken or loose. Make sure that handrails are as long as the stairways. Check any carpeting to make sure that it is firmly attached to the stairs. Fix any carpet that is loose or worn. Avoid having throw rugs at the top or bottom of the stairs. If you do have throw rugs, attach them to the floor with carpet tape. Make sure that you have a light switch at the top of the stairs and the bottom of the stairs. If you do not have them, ask someone to add them for you. What else can I do to help prevent falls? Wear shoes  that: Do not have high heels. Have rubber bottoms. Are comfortable and fit you well. Are closed at the toe. Do not wear sandals. If you use a stepladder: Make sure that it is fully opened. Do not climb a closed stepladder. Make sure that both sides of the stepladder are locked into place. Ask someone to hold it for you, if possible. Clearly mark and make sure that you can see: Any grab bars or handrails. First and last steps. Where the edge of each step is. Use tools that help you move around (mobility aids) if they  are needed. These include: Canes. Walkers. Scooters. Crutches. Turn on the lights when you go into a dark area. Replace any light bulbs as soon as they burn out. Set up your furniture so you have a clear path. Avoid moving your furniture around. If any of your floors are uneven, fix them. If there are any pets around you, be aware of where they are. Review your medicines with your doctor. Some medicines can make you feel dizzy. This can increase your chance of falling. Ask your doctor what other things that you can do to help prevent falls. This information is not intended to replace advice given to you by your health care provider. Make sure you discuss any questions you have with your health care provider. Document Released: 12/18/2008 Document Revised: 07/30/2015 Document Reviewed: 03/28/2014 Elsevier Interactive Patient Education  2017 [employer].

## 2022-06-15 DIAGNOSIS — J189 Pneumonia, unspecified organism: Secondary | ICD-10-CM | POA: Diagnosis not present

## 2022-06-15 DIAGNOSIS — J849 Interstitial pulmonary disease, unspecified: Secondary | ICD-10-CM | POA: Diagnosis not present

## 2022-06-15 DIAGNOSIS — J439 Emphysema, unspecified: Secondary | ICD-10-CM | POA: Diagnosis not present

## 2022-06-15 DIAGNOSIS — J9621 Acute and chronic respiratory failure with hypoxia: Secondary | ICD-10-CM | POA: Diagnosis not present

## 2022-06-15 DIAGNOSIS — J4489 Other specified chronic obstructive pulmonary disease: Secondary | ICD-10-CM | POA: Diagnosis not present

## 2022-06-15 DIAGNOSIS — G3184 Mild cognitive impairment, so stated: Secondary | ICD-10-CM | POA: Diagnosis not present

## 2022-06-22 ENCOUNTER — Telehealth: Payer: Self-pay | Admitting: Family Medicine

## 2022-06-22 DIAGNOSIS — G3184 Mild cognitive impairment, so stated: Secondary | ICD-10-CM | POA: Diagnosis not present

## 2022-06-22 DIAGNOSIS — J9621 Acute and chronic respiratory failure with hypoxia: Secondary | ICD-10-CM | POA: Diagnosis not present

## 2022-06-22 DIAGNOSIS — J189 Pneumonia, unspecified organism: Secondary | ICD-10-CM | POA: Diagnosis not present

## 2022-06-22 DIAGNOSIS — J439 Emphysema, unspecified: Secondary | ICD-10-CM | POA: Diagnosis not present

## 2022-06-22 DIAGNOSIS — J4489 Other specified chronic obstructive pulmonary disease: Secondary | ICD-10-CM | POA: Diagnosis not present

## 2022-06-22 DIAGNOSIS — J849 Interstitial pulmonary disease, unspecified: Secondary | ICD-10-CM | POA: Diagnosis not present

## 2022-06-22 NOTE — Telephone Encounter (Signed)
Copied from CRM [id number]. Topic: General - Inquiry >> Jun 22, 2022 11:10 AM Patsy Lager T wrote: Reason for CRM: Jackied from [facility] is requesting to have the office visit notes from patients 08/25/22 visit. Notes are required by Medicare to show patient is using the hospital bed and benefiting from it.

## 2022-06-22 NOTE — Telephone Encounter (Signed)
Called [facility] to explain that the information that she has requested was not available she stated that she will reach out to the patient and have her schedule an appt to talk to provider about the hospital bed.

## 2022-07-04 ENCOUNTER — Ambulatory Visit: Payer: Self-pay | Admitting: *Deleted

## 2022-07-04 NOTE — Patient Outreach (Signed)
  Care Coordination   Follow Up Visit Note   07/04/2022 Name: Kristi Clark MRN: [id number] DOB: 05-09-1950  Kristi Clark is a 72 y.o. year old female who sees Dorcas Carrow, DO for primary care. I spoke with  Annabell Sabal and daughter [name] by phone today.  What matters to the patients health and wellness today?  Improving strength and keeping heart health managed.     Goals Addressed             This Visit's Progress    Effective management of heart conditions   On track    Care Coordination Interventions: Counseled on increased risk of stroke due to Afib and benefits of anticoagulation for stroke prevention Counseled on bleeding risk associated with Plavix and aspirin and importance of self-monitoring for signs/symptoms of bleeding Counseled on avoidance of NSAIDs due to increased bleeding risk with anticoagulants Provided education on low sodium diet Advised patient to weigh each morning after emptying bladder Discussed importance of daily weight and advised patient to weigh and record daily Evaluation of current treatment plan related to hypertension self management and patient's adherence to plan as established by provider Reviewed medications with patient and discussed importance of compliance Discussed plans with patient for ongoing care management follow up and provided patient with direct contact information for care management team       COMPLETED: Prevent recurrent fall   On track    Care Coordination Interventions: Provided written and verbal education re: potential causes of falls and Fall prevention strategies Advised patient of importance of notifying provider of falls Assessed for falls since last encounter Advised patient to discuss Recent fall with provider Discussed need to be evaluated for fractures.  [name] took her to Urgent Care, but patient refused to wait and refused to be seen at the ED.  State she is fearful that she will have to be  admitted and "won't come out." [employer] understanding of importance of evaluation, will continue to discuss with patient. [name] will discuss recent fall with home health nurse that will visit todat. Appointment with PCP on 12/28  Goal met. No falls as of 4/29         SDOH assessments and interventions completed:  No     Care Coordination Interventions:  Yes, provided   Interventions Today    Flowsheet Row Most Recent Value  Chronic Disease   Chronic disease during today's visit Congestive Heart Failure (CHF), Hypertension (HTN), Atrial Fibrillation (AFib)  General Interventions   General Interventions Discussed/Reviewed General Interventions Reviewed, Doctor Visits  Doctor Visits Discussed/Reviewed Doctor Visits Reviewed, Annual Wellness Visits, PCP, Specialist  [cardiology on 6/3, PCP on 6/20, pulmonary on 6/19.PT session last week canceled due to diarrhea, will reschedule for this week]  PCP/Specialist Visits Compliance with follow-up visit  [AWV done on 4/8]  Education Interventions   Education Provided Provided Education  Provided Verbal Education On Medication, When to see the doctor, Other  [Nose bleed today, now stopped.  Aware of when to contact provider or seek emergency attention.  BP monitoring - daughter report "stable"]        Follow up plan: Follow up call scheduled for 6/24    Encounter Outcome:  Pt. Visit Completed   Kemper Durie, RN, MSN, [facility] Care Management Care Management Coordinator [phone]

## 2022-07-05 DIAGNOSIS — J9621 Acute and chronic respiratory failure with hypoxia: Secondary | ICD-10-CM | POA: Diagnosis not present

## 2022-07-05 DIAGNOSIS — J439 Emphysema, unspecified: Secondary | ICD-10-CM | POA: Diagnosis not present

## 2022-07-05 DIAGNOSIS — G3184 Mild cognitive impairment, so stated: Secondary | ICD-10-CM | POA: Diagnosis not present

## 2022-07-05 DIAGNOSIS — J189 Pneumonia, unspecified organism: Secondary | ICD-10-CM | POA: Diagnosis not present

## 2022-07-05 DIAGNOSIS — J4489 Other specified chronic obstructive pulmonary disease: Secondary | ICD-10-CM | POA: Diagnosis not present

## 2022-07-05 DIAGNOSIS — J849 Interstitial pulmonary disease, unspecified: Secondary | ICD-10-CM | POA: Diagnosis not present

## 2022-07-13 ENCOUNTER — Ambulatory Visit: Payer: Medicare Other | Admitting: Dermatology

## 2022-07-18 ENCOUNTER — Other Ambulatory Visit: Payer: Self-pay | Admitting: Cardiovascular Disease

## 2022-08-03 ENCOUNTER — Ambulatory Visit (INDEPENDENT_AMBULATORY_CARE_PROVIDER_SITE_OTHER): Payer: Medicare Other | Admitting: Dermatology

## 2022-08-03 ENCOUNTER — Encounter: Payer: Self-pay | Admitting: Dermatology

## 2022-08-03 VITALS — BP 130/50 | HR 54

## 2022-08-03 DIAGNOSIS — X32XXXA Exposure to sunlight, initial encounter: Secondary | ICD-10-CM | POA: Diagnosis not present

## 2022-08-03 DIAGNOSIS — L853 Xerosis cutis: Secondary | ICD-10-CM

## 2022-08-03 DIAGNOSIS — Z1283 Encounter for screening for malignant neoplasm of skin: Secondary | ICD-10-CM

## 2022-08-03 DIAGNOSIS — Z85828 Personal history of other malignant neoplasm of skin: Secondary | ICD-10-CM

## 2022-08-03 DIAGNOSIS — D692 Other nonthrombocytopenic purpura: Secondary | ICD-10-CM

## 2022-08-03 DIAGNOSIS — L821 Other seborrheic keratosis: Secondary | ICD-10-CM

## 2022-08-03 DIAGNOSIS — W908XXA Exposure to other nonionizing radiation, initial encounter: Secondary | ICD-10-CM | POA: Diagnosis not present

## 2022-08-03 DIAGNOSIS — L578 Other skin changes due to chronic exposure to nonionizing radiation: Secondary | ICD-10-CM

## 2022-08-03 DIAGNOSIS — D229 Melanocytic nevi, unspecified: Secondary | ICD-10-CM

## 2022-08-03 DIAGNOSIS — L814 Other melanin hyperpigmentation: Secondary | ICD-10-CM

## 2022-08-03 DIAGNOSIS — L820 Inflamed seborrheic keratosis: Secondary | ICD-10-CM

## 2022-08-03 DIAGNOSIS — D1801 Hemangioma of skin and subcutaneous tissue: Secondary | ICD-10-CM

## 2022-08-03 NOTE — Progress Notes (Signed)
Follow-Up Visit   Subjective  Kristi Clark is a 72 y.o. female who presents for the following: Skin Cancer Screening and Full Body Skin Exam  The patient presents for Total-Body Skin Exam (TBSE) for skin cancer screening and mole check. The patient has spots, moles and lesions to be evaluated, some may be new or changing and the patient has concerns that these could be cancer.  Some lesions are irritating and she picks at them.  Patient accompanied by daughter.   The following portions of the chart were reviewed this encounter and updated as appropriate: medications, allergies, medical history  Review of Systems:  No other skin or systemic complaints except as noted in HPI or Assessment and Plan.  Objective  Well appearing patient in no apparent distress; mood and affect are within normal limits.  A full examination was performed including scalp, head, eyes, ears, nose, lips, neck, chest, axillae, abdomen, back, buttocks, bilateral upper extremities, bilateral lower extremities, hands, feet, fingers, toes, fingernails, and toenails. All findings within normal limits unless otherwise noted below.   Relevant physical exam findings are noted in the Assessment and Plan.  R calf x1, L ant thigh x1, R lat knee x1, R post auricular hair line x1, vertex scalp x1 (5) Erythematous keratotic or waxy stuck-on papule or plaque.    Assessment & Plan   HISTORY OF BASAL CELL CARCINOMA OF THE SKIN. Left upper lateral eyelid. Mohs 10/2017. - No evidence of recurrence today - Recommend regular full body skin exams - Recommend daily broad spectrum sunscreen SPF 30+ to sun-exposed areas, reapply every 2 hours as needed.  - Call if any new or changing lesions are noted between office visits   LENTIGINES, SEBORRHEIC KERATOSES, HEMANGIOMAS - Benign normal skin lesions - Benign-appearing - Call for any changes  MELANOCYTIC NEVI - Tan-brown and/or pink-flesh-colored symmetric macules and  papules - Benign appearing on exam today - Observation - Call clinic for new or changing moles - Recommend daily use of broad spectrum spf 30+ sunscreen to sun-exposed areas.   ACTINIC DAMAGE - Chronic condition, secondary to cumulative UV/sun exposure - diffuse scaly erythematous macules with underlying dyspigmentation - Recommend daily broad spectrum sunscreen SPF 30+ to sun-exposed areas, reapply every 2 hours as needed.  - Staying in the shade or wearing long sleeves, sun glasses (UVA+UVB protection) and wide brim hats (4-inch brim around the entire circumference of the hat) are also recommended for sun protection.  - Call for new or changing lesions.  SKIN CANCER SCREENING PERFORMED TODAY.  Xerosis - diffuse xerotic patches - recommend gentle, hydrating skin care - gentle skin care handout given  Purpura - Chronic; persistent and recurrent.  Treatable, but not curable. - Violaceous macules and patches - Benign - Related to trauma, age, sun damage and/or use of blood thinners, chronic use of topical and/or oral steroids - Observe - Can use OTC arnica containing moisturizer such as Dermend Bruise Formula if desired - Call for worsening or other concerns    Inflamed seborrheic keratosis (5) R calf x1, L ant thigh x1, R lat knee x1, R post auricular hair line x1, vertex scalp x1  Symptomatic, irritating, patient would like treated.   Noted on exam: Vertex Scalp x1, R post auricular hair line x2, R forearm x1, L post shoulder x1, L pretibia x, R calf x, L ant thigh x1, R lat knee x1, R calf. Selected lesions treated under locations tab.  Destruction of lesion - R calf x1, L  ant thigh x1, R lat knee x1, R post auricular hair line x1, vertex scalp x1  Destruction method: cryotherapy   Informed consent: discussed and consent obtained   Lesion destroyed using liquid nitrogen: Yes   Region frozen until ice ball extended beyond lesion: Yes   Outcome: patient tolerated procedure  well with no complications   Post-procedure details: wound care instructions given   Additional details:  Prior to procedure, discussed risks of blister formation, small wound, skin dyspigmentation, or rare scar following cryotherapy. Recommend Vaseline ointment to treated areas while healing.    Return in about 1 year (around 08/03/2023) for TBSE, HxBCC.  I, Lawson Radar, CMA, am acting as scribe for Willeen Niece, MD.   Documentation: I have reviewed the above documentation for accuracy and completeness, and I agree with the above.  Willeen Niece, MD

## 2022-08-03 NOTE — Patient Instructions (Addendum)
Cryotherapy Aftercare  Wash gently with soap and water everyday.   Apply Vaseline Jelly daily until healed.   Recommend daily broad spectrum sunscreen SPF 30+ to sun-exposed areas, reapply every 2 hours as needed. Call for new or changing lesions.  Staying in the shade or wearing long sleeves, sun glasses (UVA+UVB protection) and wide brim hats (4-inch brim around the entire circumference of the hat) are also recommended for sun protection.    Seborrheic Keratosis  What causes seborrheic keratoses? Seborrheic keratoses are harmless, common skin growths that first appear during adult life.  As time goes by, more growths appear.  Some people may develop a large number of them.  Seborrheic keratoses appear on both covered and uncovered body parts.  They are not caused by sunlight.  The tendency to develop seborrheic keratoses can be inherited.  They vary in color from skin-colored to gray, brown, or even black.  They can be either smooth or have a rough, warty surface.   Seborrheic keratoses are superficial and look as if they were stuck on the skin.  Under the microscope this type of keratosis looks like layers upon layers of skin.  That is why at times the top layer may seem to fall off, but the rest of the growth remains and re-grows.    Treatment Seborrheic keratoses do not need to be treated, but can easily be removed in the office.  Seborrheic keratoses often cause symptoms when they rub on clothing or jewelry.  Lesions can be in the way of shaving.  If they become inflamed, they can cause itching, soreness, or burning.  Removal of a seborrheic keratosis can be accomplished by freezing, burning, or surgery. If any spot bleeds, scabs, or grows rapidly, please return to have it checked, as these can be an indication of a skin cancer.   Recommend starting moisturizer with exfoliant (Urea, Salicylic acid, or Lactic acid) one to two times daily to help smooth rough and bumpy skin.  OTC options  include Cetaphil Rough and Bumpy lotion (Urea), Eucerin Roughness Relief lotion or spot treatment cream (Urea), CeraVe SA lotion/cream for Rough and Bumpy skin (Sal Acid), Gold Bond Rough and Bumpy cream (Sal Acid), and AmLactin 12% lotion/cream (Lactic Acid).  If applying in morning, also apply sunscreen to sun-exposed areas, since these exfoliating moisturizers can increase sensitivity to sun.       Melanoma ABCDEs  Melanoma is the most dangerous type of skin cancer, and is the leading cause of death from skin disease.  You are more likely to develop melanoma if you: Have light-colored skin, light-colored eyes, or red or blond hair Spend a lot of time in the sun Tan regularly, either outdoors or in a tanning bed Have had blistering sunburns, especially during childhood Have a close family member who has had a melanoma Have atypical moles or large birthmarks  Early detection of melanoma is key since treatment is typically straightforward and cure rates are extremely high if we catch it early.   The first sign of melanoma is often a change in a mole or a new dark spot.  The ABCDE system is a way of remembering the signs of melanoma.  A for asymmetry:  The two halves do not match. B for border:  The edges of the growth are irregular. C for color:  A mixture of colors are present instead of an even brown color. D for diameter:  Melanomas are usually (but not always) greater than 6mm - the size  of a pencil eraser. E for evolution:  The spot keeps changing in size, shape, and color.  Please check your skin once per month between visits. You can use a small mirror in front and a large mirror behind you to keep an eye on the back side or your body.   If you see any new or changing lesions before your next follow-up, please call to schedule a visit.  Please continue daily skin protection including broad spectrum sunscreen SPF 30+ to sun-exposed areas, reapplying every 2 hours as needed when  you're outdoors.   Staying in the shade or wearing long sleeves, sun glasses (UVA+UVB protection) and wide brim hats (4-inch brim around the entire circumference of the hat) are also recommended for sun protection.     Due to recent changes in healthcare laws, you may see results of your pathology and/or laboratory studies on MyChart before the doctors have had a chance to review them. We understand that in some cases there may be results that are confusing or concerning to you. Please understand that not all results are received at the same time and often the doctors may need to interpret multiple results in order to provide you with the best plan of care or course of treatment. Therefore, we ask that you please give [place] 2 business days to thoroughly review all your results before contacting the office for clarification. Should we see a critical lab result, you will be contacted sooner.   If You Need Anything After Your Visit  If you have any questions or concerns for your doctor, please call our main line at [phone] and press option 4 to reach your doctor's medical assistant. If no one answers, please leave a voicemail as directed and we will return your call as soon as possible. Messages left after 4 pm will be answered the following business day.   You may also send [place] a message via MyChart. We typically respond to MyChart messages within 1-2 business days.  For prescription refills, please ask your pharmacy to contact our office. Our fax number is [phone].  If you have an urgent issue when the clinic is closed that cannot wait until the next business day, you can page your doctor at the number below.    Please note that while we do our best to be available for urgent issues outside of office hours, we are not available 24/7.   If you have an urgent issue and are unable to reach [place], you may choose to seek medical care at your doctor's office, retail clinic, urgent care center, or  emergency room.  If you have a medical emergency, please immediately call 911 or go to the emergency department.  Pager Numbers  - Dr. Gwen Pounds: [phone]  - Dr. Neale Burly: [phone]  - Dr. Roseanne Reno: [phone]  In the event of inclement weather, please call our main line at [phone] for an update on the status of any delays or closures.  Dermatology Medication Tips: Please keep the boxes that topical medications come in in order to help keep track of the instructions about where and how to use these. Pharmacies typically print the medication instructions only on the boxes and not directly on the medication tubes.   If your medication is too expensive, please contact our office at [phone] option 4 or send [place] a message through MyChart.   We are unable to tell what your co-pay for medications will be in advance as this is different depending on your  insurance coverage. However, we may be able to find a substitute medication at lower cost or fill out paperwork to get insurance to cover a needed medication.   If a prior authorization is required to get your medication covered by your insurance company, please allow [place] 1-2 business days to complete this process.  Drug prices often vary depending on where the prescription is filled and some pharmacies may offer cheaper prices.  The website www.goodrx.com contains coupons for medications through different pharmacies. The prices here do not account for what the cost may be with help from insurance (it may be cheaper with your insurance), but the website can give you the price if you did not use any insurance.  - You can print the associated coupon and take it with your prescription to the pharmacy.  - You may also stop by our office during regular business hours and pick up a GoodRx coupon card.  - If you need your prescription sent electronically to a different pharmacy, notify our office through [facility] or by phone at  [phone] option 4.     Si Usted Necesita Algo Despus de Su Visita  Tambin puede enviarnos un mensaje a travs de Clinical cytogeneticist. Por lo general respondemos a los mensajes de MyChart en el transcurso de 1 a 2 das hbiles.  Para renovar recetas, por favor pida a su farmacia que se ponga en contacto con nuestra oficina. Annie Sable de fax es [place] [phone].  Si tiene un asunto urgente cuando la clnica est cerrada y que no puede esperar hasta el siguiente da hbil, puede llamar/localizar a su doctor(a) al nmero que aparece a continuacin.   Por favor, tenga en cuenta que aunque hacemos todo lo posible para estar disponibles para asuntos urgentes fuera del horario de [place], no estamos disponibles las 24 horas del da, los 7 [address]  [address] de la [place].   Si tiene un problema urgente y no puede comunicarse con nosotros, puede optar por buscar atencin mdica  en el consultorio de su doctor(a), en una clnica privada, en un centro de atencin urgente o en una sala de emergencias.  Si tiene Engineer, drilling, por favor llame inmediatamente al 911 o vaya a la sala de emergencias.  Nmeros de bper  - Dr. Gwen Pounds: [phone]  - Dra. Moye: [phone]  - Dra. Roseanne Reno: [phone]  En caso de inclemencias del [place], por favor llame a Lacy Duverney principal al [phone] para una actualizacin sobre el [place] de cualquier retraso o cierre.  Consejos para la medicacin en dermatologa: Por favor, guarde las cajas en las que vienen los medicamentos de uso tpico para ayudarle a seguir las instrucciones sobre dnde y cmo usarlos. Las farmacias generalmente imprimen las instrucciones del medicamento slo en las cajas y no directamente en los tubos del [place].   Si su medicamento es muy caro, por favor, pngase en contacto con Rolm Gala llamando al [phone] y presione la opcin 4 o envenos un mensaje a travs de Clinical cytogeneticist.   No podemos decirle cul ser su copago por los  medicamentos por adelantado ya que esto es diferente dependiendo de la cobertura de su seguro. Sin embargo, es posible que podamos encontrar un medicamento sustituto a Audiological scientist un formulario para que el seguro cubra el medicamento que se considera necesario.   Si se requiere una autorizacin previa para que su compaa de seguros [place] su medicamento, por favor permtanos de 1 a 2 das hbiles para completar [address]  Los  precios de los medicamentos varan con frecuencia dependiendo del lugar de dnde se surte la receta y alguna farmacias pueden ofrecer precios ms baratos.  El sitio web www.goodrx.com tiene cupones para medicamentos de Health and safety inspector. Los precios aqu no tienen en cuenta lo que podra costar con la ayuda del seguro (puede ser ms barato con su seguro), pero el sitio web puede darle el precio si no utiliz Tourist information centre manager.  - Puede imprimir el cupn correspondiente y llevarlo con su receta a la farmacia.  - Tambin puede pasar por nuestra oficina durante el horario de atencin regular y Education officer, museum una tarjeta de cupones de GoodRx.  - Si necesita que su receta se enve electrnicamente a una farmacia diferente, informe a nuestra oficina a travs de MyChart de [place] o por telfono llamando al [phone] y presione la opcin 4.

## 2022-08-08 ENCOUNTER — Ambulatory Visit (INDEPENDENT_AMBULATORY_CARE_PROVIDER_SITE_OTHER): Payer: Medicare Other | Admitting: Cardiovascular Disease

## 2022-08-08 ENCOUNTER — Encounter: Payer: Self-pay | Admitting: Cardiovascular Disease

## 2022-08-08 VITALS — BP 160/50 | HR 67 | Ht 59.0 in | Wt 97.0 lb

## 2022-08-08 DIAGNOSIS — I5032 Chronic diastolic (congestive) heart failure: Secondary | ICD-10-CM

## 2022-08-08 DIAGNOSIS — I2510 Atherosclerotic heart disease of native coronary artery without angina pectoris: Secondary | ICD-10-CM

## 2022-08-08 DIAGNOSIS — E782 Mixed hyperlipidemia: Secondary | ICD-10-CM

## 2022-08-08 DIAGNOSIS — I480 Paroxysmal atrial fibrillation: Secondary | ICD-10-CM

## 2022-08-08 DIAGNOSIS — I5082 Biventricular heart failure: Secondary | ICD-10-CM | POA: Diagnosis not present

## 2022-08-08 DIAGNOSIS — I10 Essential (primary) hypertension: Secondary | ICD-10-CM

## 2022-08-08 MED ORDER — AMLODIPINE BESYLATE 5 MG PO TABS: 5.0000 mg | ORAL_TABLET | Freq: Every day | ORAL | 1 refills | Status: DC

## 2022-08-08 MED ORDER — HYDRALAZINE HCL 25 MG PO TABS: 25.0000 mg | ORAL_TABLET | Freq: Two times a day (BID) | ORAL | 1 refills | Status: DC

## 2022-08-08 NOTE — Patient Instructions (Addendum)
Increase amlodipine to 5 mg daily  Increase hydralazine to 25 mg twice daily  Check blood pressure at 2:00 pm after sitting for 30 minutes

## 2022-08-08 NOTE — Assessment & Plan Note (Signed)
Shortness of breath unchanged. On 3 L O2 via [place]. Will repeat echo prior to next visit.

## 2022-08-08 NOTE — Assessment & Plan Note (Signed)
Systolic b/p elevated, diastolic low. Will increase amlodipine to 5 mg daily, increase hydralazine to 2 mg twice daily. Recommend checking b/p at home.

## 2022-08-08 NOTE — Progress Notes (Signed)
Cardiology Office Note   Date:  08/08/2022   ID:  HAELEIGH YAMADA, DOB 03/16/1950, MRN [id number]  PCP:  Dorcas Carrow, DO  Cardiologist:  Adrian Blackwater, MD      History of Present Illness: Kristi Clark is a 72 y.o. female who presents for  Chief Complaint  Patient presents with   Follow-up    2 Months Follow Up    Patient in office for 2 month follow up. Patient denies chest pain. Shortness of breath unchanged, on 3 L O2.      Past Medical History:  Diagnosis Date   AK (actinic keratosis) 10/25/2017   left forehead   Arrhythmia    atrial fibrillation   BCC (basal cell carcinoma) 10/25/2017   left upper lateral eyelid, Moh's   CHF (congestive heart failure) (HCC)    COPD (chronic obstructive pulmonary disease) (HCC)    Coronary artery disease    Depression    Dysrhythmia    GERD (gastroesophageal reflux disease)    Hyperlipidemia    Hypertension    Menopause    Osteopenia    Severe sepsis (HCC) 07/14/2014   Tobacco abuse      Past Surgical History:  Procedure Laterality Date   CARDIAC CATHETERIZATION     CHOLECYSTECTOMY  2014   COLONOSCOPY WITH PROPOFOL N/A 01/16/2018   Procedure: COLONOSCOPY WITH PROPOFOL;  Surgeon: Christena Deem, MD;  Location: [facility] ENDOSCOPY;  Service: Endoscopy;  Laterality: N/A;   CORONARY ANGIOPLASTY     ESOPHAGOGASTRODUODENOSCOPY (EGD) WITH PROPOFOL N/A 01/16/2018   Procedure: ESOPHAGOGASTRODUODENOSCOPY (EGD) WITH PROPOFOL;  Surgeon: Christena Deem, MD;  Location: [facility] ENDOSCOPY;  Service: Endoscopy;  Laterality: N/A;   heart stent     LEFT HEART CATH AND CORONARY ANGIOGRAPHY N/A 09/17/2021   Procedure: LEFT HEART CATH AND CORONARY ANGIOGRAPHY;  Surgeon: Laurier Nancy, MD;  Location: [facility] INVASIVE CV LAB;  Service: Cardiovascular;  Laterality: N/A;   TEE WITHOUT CARDIOVERSION N/A 05/11/2021   Procedure: TRANSESOPHAGEAL ECHOCARDIOGRAM (TEE);  Surgeon: Lamar Blinks, MD;  Location: [facility] ORS;  Service:  Cardiovascular;  Laterality: N/A;   TOTAL ABDOMINAL HYSTERECTOMY       Current Outpatient Medications  Medication Sig Dispense Refill   albuterol (PROVENTIL) (2.5 MG/3ML) 0.083% nebulizer solution Take 3 mLs (2.5 mg total) by nebulization every 4 (four) hours as needed for wheezing or shortness of breath. 570 mL 1   albuterol (VENTOLIN HFA) 108 (90 Base) MCG/ACT inhaler Inhale 2 puffs into the lungs every 6 (six) hours as needed for wheezing or shortness of breath. 18 g 3   Aloe-Sodium Chloride (AYR SALINE NASAL GEL NA) Place 1 Application into the nose as needed (dry nares).     amiodarone (PACERONE) 200 MG tablet Take 1 tablet by mouth once daily 30 tablet 0   amLODipine (NORVASC) 5 MG tablet Take 1 tablet (5 mg total) by mouth daily. 30 tablet 1   aspirin 81 MG tablet Take 81 mg by mouth daily.     atorvastatin (LIPITOR) 40 MG tablet Take 1 tablet (40 mg total) by mouth daily. 90 tablet 1   Blood Pressure Monitoring (BLOOD PRESSURE MONITOR/WRIST) KIT Take Blood pressure as needed, Dx: I12.9 1 kit 0   cetirizine (ZYRTEC) 10 MG tablet Take 10 mg by mouth daily.     clopidogrel (PLAVIX) 75 MG tablet Take 1 tablet (75 mg total) by mouth daily. 90 tablet 1   diclofenac Sodium (VOLTAREN) 1 % GEL Apply 4 g topically  4 (four) times daily. 350 g 3   feeding supplement (ENSURE ENLIVE / ENSURE PLUS) LIQD Take 237 mLs by mouth 2 (two) times daily between meals. 237 mL 12   ferrous sulfate 325 (65 FE) MG tablet Take 1 tablet (325 mg total) by mouth daily with breakfast. 30 tablet 3   Fluticasone-Umeclidin-Vilant (TRELEGY ELLIPTA) 100-62.5-25 MCG/ACT AEPB 100 mcg DAILY (route: inhalation)     furosemide (LASIX) 20 MG tablet Take 1 tablet (20 mg total) by mouth daily. 5 tablet 0   hydrALAZINE (APRESOLINE) 25 MG tablet Take 1 tablet (25 mg total) by mouth 2 (two) times daily. 90 tablet 1   hydrochlorothiazide (MICROZIDE) 12.5 MG capsule Take 1 capsule (12.5 mg total) by mouth daily as needed. 30 capsule 3    isosorbide mononitrate (IMDUR) 60 MG 24 hr tablet Take 1 tablet (60 mg total) by mouth 2 (two) times daily. 90 tablet 1   losartan (COZAAR) 25 MG tablet Take 1 tablet (25 mg total) by mouth daily. 90 tablet 1   metoprolol succinate (TOPROL-XL) 50 MG 24 hr tablet Take 1 tablet (50 mg total) by mouth daily. Take with or immediately following a meal. 90 tablet 1   mirtazapine (REMERON) 45 MG tablet Take 1 tablet (45 mg total) by mouth at bedtime. 90 tablet 1   Multiple Vitamin (MULTIVITAMIN WITH MINERALS) TABS tablet Take 1 tablet by mouth daily.     nystatin (MYCOSTATIN) 100000 UNIT/ML suspension Take 5 mLs (500,000 Units total) by mouth 4 (four) times daily. 120 mL 0   pantoprazole (PROTONIX) 40 MG tablet Take 1 tablet (40 mg total) by mouth 2 (two) times daily. 60 tablet 9   polyethylene glycol powder (GLYCOLAX/MIRALAX) 17 GM/SCOOP powder Take 17 g by mouth 3 (three) times daily. 3350 g 3   QUEtiapine (SEROQUEL XR) 50 MG TB24 24 hr tablet Take 2 tablets (100 mg total) by mouth at bedtime. 180 tablet 1   No current facility-administered medications for this visit.    Allergies:   Clindamycin/lincomycin, Prednisone, Amoxicillin, Avelox [moxifloxacin hcl in nacl], Codeine sulfate, Penicillins, and Sulfa antibiotics    Social History:   reports that she has quit smoking. Her smoking use included cigarettes. She has a 56.00 pack-year smoking history. She has never used smokeless tobacco. She reports that she does not drink alcohol and does not use drugs.   Family History:  family history includes Asthma in her son; Breast cancer in her paternal aunt; Breast cancer (age of onset: 19) in her sister; Cancer in her maternal grandmother, mother, sister, and son; Diabetes in her brother, brother, daughter, and sister; Heart disease in her brother and father; Hyperlipidemia in her father; Hypertension in her daughter; Stroke in her mother.    ROS:     Review of Systems  Constitutional: Negative.    HENT: Negative.    Eyes: Negative.   Respiratory:  Positive for shortness of breath.   Cardiovascular: Negative.   Gastrointestinal: Negative.   Genitourinary: Negative.   Musculoskeletal: Negative.   Skin: Negative.   Neurological: Negative.   Endo/Heme/Allergies: Negative.   Psychiatric/Behavioral: Negative.    All other systems reviewed and are negative.    All other systems are reviewed and negative.    PHYSICAL EXAM: VS:  BP (!) 160/50 (BP Location: Left Arm, Patient Position: Sitting, Cuff Size: Small)   Pulse 67   Ht 4\' 11"  (1.499 m)   Wt 97 lb (44 kg)   LMP  (LMP Unknown)   SpO2 Marland Kitchen)  88% Comment: 3 L oxygen  BMI 19.59 kg/m  , BMI Body mass index is 19.59 kg/m. Last weight:  Wt Readings from Last 3 Encounters:  08/08/22 97 lb (44 kg)  06/13/22 94 lb (42.6 kg)  06/07/22 94 lb (42.6 kg)     Physical Exam Constitutional:      Appearance: Normal appearance.  Cardiovascular:     Rate and Rhythm: Normal rate and regular rhythm.     Heart sounds: Normal heart sounds.  Pulmonary:     Effort: Pulmonary effort is normal.     Breath sounds: Normal breath sounds.  Musculoskeletal:     Right lower leg: No edema.     Left lower leg: No edema.  Neurological:     Mental Status: She is alert.     EKG: none today  Recent Labs: 04/18/2022: ALT 77; B Natriuretic Peptide 746.3; TSH 0.701 04/21/2022: BUN 25; Creatinine, Ser 0.70; Hemoglobin 7.9; Magnesium 2.0; Platelets 354; Potassium 4.7; Sodium 133    Lipid Panel    Component Value Date/Time   CHOL 100 01/15/2022 0640   CHOL 107 08/16/2021 1404   CHOL 148 03/06/2015 0831   TRIG 54 01/15/2022 0640   TRIG 100 03/06/2015 0831   HDL 48 01/15/2022 0640   HDL 49 08/16/2021 1404   CHOLHDL 2.1 01/15/2022 0640   VLDL 11 01/15/2022 0640   VLDL 20 03/06/2015 0831   LDLCALC 41 01/15/2022 0640   LDLCALC 42 08/16/2021 1404     ASSESSMENT AND PLAN:    ICD-10-CM   1. Coronary artery disease involving native coronary  artery of native heart without angina pectoris  I25.10     2. 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 (HCC)  I50.82    I50.32     3. Essential hypertension  I10 PCV ECHOCARDIOGRAM COMPLETE    4. Paroxysmal A-fib (HCC)  I48.0     5. Mixed hyperlipidemia  E78.2        Problem List Items Addressed This Visit       Cardiovascular and Mediastinum   Essential hypertension    Systolic b/p elevated, diastolic low. Will increase amlodipine to 5 mg daily, increase hydralazine to 2 mg twice daily. Recommend checking b/p at home.       Relevant Medications   amLODipine (NORVASC) 5 MG tablet   hydrALAZINE (APRESOLINE) 25 MG tablet   Other Relevant Orders   PCV ECHOCARDIOGRAM COMPLETE   CAD (coronary artery disease) - Primary    Doing well. Denies chest pain. Shortness of breath stable. 09/2021 LHC- Mid Cx lesion is 55% stenosed, Dist LAD lesion is 55% stenosed.      Relevant Medications   amLODipine (NORVASC) 5 MG tablet   hydrALAZINE (APRESOLINE) 25 MG tablet   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 (HCC)    Shortness of breath unchanged. On 3 L O2 via [place]. Will repeat echo prior to next visit.       Relevant Medications   amLODipine (NORVASC) 5 MG tablet   hydrALAZINE (APRESOLINE) 25 MG tablet   Paroxysmal A-fib (HCC)    In sinus rhythm on auscultation. Continue amiodarone.       Relevant Medications   amLODipine (NORVASC) 5 MG tablet   hydrALAZINE (APRESOLINE) 25 MG tablet     Other   Hyperlipidemia    Continue atorvastatin 40 mg daily.       Relevant Medications   amLODipine (NORVASC) 5 MG tablet   hydrALAZINE (APRESOLINE) 25  MG tablet     Disposition:   Return in about 4 weeks (around 09/05/2022) for after echo.    Total time spent: 30 minutes  Signed,  Adrian Blackwater, MD  08/08/2022 9:42 AM    [facility]

## 2022-08-08 NOTE — Assessment & Plan Note (Addendum)
In sinus rhythm on auscultation. Continue amiodarone.  

## 2022-08-08 NOTE — Assessment & Plan Note (Signed)
Doing well. Denies chest pain. Shortness of breath stable. 09/2021 LHC- Mid Cx lesion is 55% stenosed, Dist LAD lesion is 55% stenosed.

## 2022-08-08 NOTE — Assessment & Plan Note (Signed)
Continue atorvastatin 40 mg daily.  

## 2022-08-15 ENCOUNTER — Ambulatory Visit (INDEPENDENT_AMBULATORY_CARE_PROVIDER_SITE_OTHER): Payer: Medicare Other

## 2022-08-15 DIAGNOSIS — I371 Nonrheumatic pulmonary valve insufficiency: Secondary | ICD-10-CM

## 2022-08-15 DIAGNOSIS — I422 Other hypertrophic cardiomyopathy: Secondary | ICD-10-CM | POA: Diagnosis not present

## 2022-08-15 DIAGNOSIS — I351 Nonrheumatic aortic (valve) insufficiency: Secondary | ICD-10-CM | POA: Diagnosis not present

## 2022-08-15 DIAGNOSIS — I361 Nonrheumatic tricuspid (valve) insufficiency: Secondary | ICD-10-CM

## 2022-08-15 DIAGNOSIS — I10 Essential (primary) hypertension: Secondary | ICD-10-CM

## 2022-08-23 ENCOUNTER — Other Ambulatory Visit: Payer: Self-pay | Admitting: Cardiovascular Disease

## 2022-08-24 DIAGNOSIS — J470 Bronchiectasis with acute lower respiratory infection: Secondary | ICD-10-CM | POA: Diagnosis not present

## 2022-08-24 DIAGNOSIS — J449 Chronic obstructive pulmonary disease, unspecified: Secondary | ICD-10-CM | POA: Diagnosis not present

## 2022-08-24 DIAGNOSIS — Z9981 Dependence on supplemental oxygen: Secondary | ICD-10-CM | POA: Diagnosis not present

## 2022-08-25 ENCOUNTER — Telehealth: Payer: Self-pay

## 2022-08-25 ENCOUNTER — Ambulatory Visit (INDEPENDENT_AMBULATORY_CARE_PROVIDER_SITE_OTHER): Payer: Medicare Other | Admitting: Family Medicine

## 2022-08-25 ENCOUNTER — Encounter: Payer: Self-pay | Admitting: Family Medicine

## 2022-08-25 VITALS — BP 148/42 | HR 62 | Temp 97.7°F | Ht 59.0 in | Wt 102.4 lb

## 2022-08-25 DIAGNOSIS — W19XXXA Unspecified fall, initial encounter: Secondary | ICD-10-CM

## 2022-08-25 DIAGNOSIS — R0602 Shortness of breath: Secondary | ICD-10-CM

## 2022-08-25 DIAGNOSIS — F339 Major depressive disorder, recurrent, unspecified: Secondary | ICD-10-CM | POA: Diagnosis not present

## 2022-08-25 DIAGNOSIS — E782 Mixed hyperlipidemia: Secondary | ICD-10-CM | POA: Diagnosis not present

## 2022-08-25 DIAGNOSIS — J449 Chronic obstructive pulmonary disease, unspecified: Secondary | ICD-10-CM

## 2022-08-25 DIAGNOSIS — I10 Essential (primary) hypertension: Secondary | ICD-10-CM | POA: Diagnosis not present

## 2022-08-25 DIAGNOSIS — I700 Atherosclerosis of aorta: Secondary | ICD-10-CM | POA: Diagnosis not present

## 2022-08-25 DIAGNOSIS — M7989 Other specified soft tissue disorders: Secondary | ICD-10-CM

## 2022-08-25 MED ORDER — ISOSORBIDE MONONITRATE ER 60 MG PO TB24: 60.0000 mg | ORAL_TABLET | Freq: Two times a day (BID) | ORAL | 1 refills | Status: DC

## 2022-08-25 MED ORDER — ALBUTEROL SULFATE HFA 108 (90 BASE) MCG/ACT IN AERS: 2.0000 | INHALATION_SPRAY | Freq: Four times a day (QID) | RESPIRATORY_TRACT | 3 refills | Status: DC | PRN

## 2022-08-25 MED ORDER — CLOPIDOGREL BISULFATE 75 MG PO TABS: 75.0000 mg | ORAL_TABLET | Freq: Every day | ORAL | 1 refills | Status: DC

## 2022-08-25 MED ORDER — ALBUTEROL SULFATE (2.5 MG/3ML) 0.083% IN NEBU: 2.5000 mg | INHALATION_SOLUTION | RESPIRATORY_TRACT | 1 refills | Status: DC | PRN

## 2022-08-25 MED ORDER — MIRTAZAPINE 45 MG PO TABS: 45.0000 mg | ORAL_TABLET | Freq: Every day | ORAL | 1 refills | Status: DC

## 2022-08-25 MED ORDER — METOPROLOL SUCCINATE ER 50 MG PO TB24: 50.0000 mg | ORAL_TABLET | Freq: Every day | ORAL | 1 refills | Status: DC

## 2022-08-25 MED ORDER — HYDROCHLOROTHIAZIDE 12.5 MG PO CAPS: 12.5000 mg | ORAL_CAPSULE | Freq: Every day | ORAL | 1 refills | Status: DC | PRN

## 2022-08-25 MED ORDER — QUETIAPINE FUMARATE ER 50 MG PO TB24: 100.0000 mg | ORAL_TABLET | Freq: Every day | ORAL | 1 refills | Status: DC

## 2022-08-25 MED ORDER — PANTOPRAZOLE SODIUM 40 MG PO TBEC: 40.0000 mg | DELAYED_RELEASE_TABLET | Freq: Two times a day (BID) | ORAL | 1 refills | Status: DC

## 2022-08-25 MED ORDER — HYDRALAZINE HCL 25 MG PO TABS: 25.0000 mg | ORAL_TABLET | Freq: Two times a day (BID) | ORAL | 1 refills | Status: DC

## 2022-08-25 MED ORDER — TRELEGY ELLIPTA 100-62.5-25 MCG/ACT IN AEPB: INHALATION_SPRAY | RESPIRATORY_TRACT | 4 refills | Status: DC

## 2022-08-25 MED ORDER — LOSARTAN POTASSIUM 25 MG PO TABS: 25.0000 mg | ORAL_TABLET | Freq: Every day | ORAL | 1 refills | Status: DC

## 2022-08-25 MED ORDER — ATORVASTATIN CALCIUM 40 MG PO TABS: 40.0000 mg | ORAL_TABLET | Freq: Every day | ORAL | 1 refills | Status: DC

## 2022-08-25 NOTE — Telephone Encounter (Signed)
DME order has been printed and placed in the provider's folder for signature. Please advise?

## 2022-08-25 NOTE — Assessment & Plan Note (Signed)
Under good control on current regimen. Continue current regimen. Continue to monitor. Call with any concerns. Refills given.   

## 2022-08-25 NOTE — Assessment & Plan Note (Signed)
Will keep her BP and cholesterol under good control. Continue to monitor. Call with any concerns.  

## 2022-08-25 NOTE — Assessment & Plan Note (Signed)
Under good control on current regimen. Continue current regimen. Continue to monitor. Call with any concerns. Refills given. Labs drawn today.   

## 2022-08-25 NOTE — Telephone Encounter (Signed)
-----   Message from Megan P Johnson, DO sent at 08/25/2022 10:03 AM EDT ----- Needs rx for rolator with 4 wheels with seat for SOB and weakness  

## 2022-08-25 NOTE — Telephone Encounter (Signed)
-----   Message from Dorcas Carrow, [place] sent at 08/25/2022 10:03 AM EDT ----- Needs rx for rolator with 4 wheels with seat for SOB and weakness

## 2022-08-25 NOTE — Progress Notes (Signed)
BP (!) 148/42   Pulse 62   Temp 97.7 F (36.5 C) (Oral)   Ht 4\' 11"  (1.499 m)   Wt 102 lb 6.4 oz (46.4 kg)   LMP  (LMP Unknown)   SpO2 95%   BMI 20.68 kg/m    Subjective:    Patient ID: Kristi Clark, female    DOB: 30-Jul-1950, 72 y.o.   MRN: [id number]  HPI: Kristi Clark is a 72 y.o. female  Chief Complaint  Patient presents with   Hypertension    Patient says she has noticed her Blood Pressure being elevated with her top numbers ranging from 150's to 160's. Patient says her Cardiologist changed her dosage of her blood pressure medications a little over a week, she is still noticing elevated readings.    COPD   Depression   Leg Swelling    Patient is noticing some swelling in her R leg and says she wear support hoses and it tends to help with the swelling of her R leg.    HYPERTENSION / HYPERLIPIDEMIA Satisfied with current treatment? yes Duration of hypertension: chronic BP monitoring frequency: not checking BP medication side effects: no Duration of hyperlipidemia: chronic Cholesterol medication side effects: no Cholesterol supplements: none Past cholesterol medications: atorvastatin Medication compliance: excellent compliance Aspirin: yes Recent stressors: no Recurrent headaches: no Visual changes: no Palpitations: no Dyspnea: no Chest pain: no Lower extremity edema: yes Dizzy/lightheaded: no  DEPRESSION Mood status: controlled Satisfied with current treatment?: yes Symptom severity: mild  Duration of current treatment : chronic Side effects: no Medication compliance: excellent compliance Psychotherapy/counseling: no  Previous psychiatric medications: seroquel, mirtazapine Depressed mood: no Anxious mood: no Anhedonia: no Significant weight loss or gain: no Insomnia: no  Fatigue: yes Feelings of worthlessness or guilt: no Impaired concentration/indecisiveness: no Suicidal ideations: no Hopelessness: no Crying spells: no    08/25/2022     9:48 AM 06/13/2022    9:34 AM 05/25/2022    8:43 AM 04/27/2022   11:31 AM 04/01/2022    2:15 PM  Depression screen PHQ 2/9  Decreased Interest 1 0 1 3 0  Down, Depressed, Hopeless 1 1 2 3 1   PHQ - 2 Score 2 1 3 6 1   Altered sleeping 1 1 0 3 0  Tired, decreased energy 1 1 1 3 2   Change in appetite 0 1 0 3 1  Feeling bad or failure about yourself  1 0 1 3 3   Trouble concentrating 0 0 0 3 0  Moving slowly or fidgety/restless 0 1 0 3 0  Suicidal thoughts 0 0 0 2 0  PHQ-9 Score 5 5 5 26 7   Difficult doing work/chores Somewhat difficult Not difficult at all Somewhat difficult Extremely dIfficult Somewhat difficult     Relevant past medical, surgical, family and social history reviewed and updated as indicated. Interim medical history since our last visit reviewed. Allergies and medications reviewed and updated.  Review of Systems  Constitutional: Negative.   HENT: Negative.    Respiratory:  Positive for cough, shortness of breath and wheezing. Negative for apnea, choking, chest tightness and stridor.   Cardiovascular:  Positive for leg swelling. Negative for chest pain and palpitations.  Gastrointestinal: Negative.   Musculoskeletal: Negative.   Neurological: Negative.   Psychiatric/Behavioral: Negative.      Per HPI unless specifically indicated above     Objective:    BP (!) 148/42   Pulse 62   Temp 97.7 F (36.5 C) (Oral)   Ht  4\' 11"  (1.499 m)   Wt 102 lb 6.4 oz (46.4 kg)   LMP  (LMP Unknown)   SpO2 95%   BMI 20.68 kg/m   Wt Readings from Last 3 Encounters:  08/25/22 102 lb 6.4 oz (46.4 kg)  08/08/22 97 lb (44 kg)  06/13/22 94 lb (42.6 kg)    Physical Exam Vitals and nursing note reviewed.  Constitutional:      General: She is not in acute distress.    Appearance: Normal appearance. She is not ill-appearing, toxic-appearing or diaphoretic.  HENT:     Head: Normocephalic and atraumatic.     Right Ear: External ear normal.     Left Ear: External ear normal.      Nose: Nose normal.     Mouth/Throat:     Mouth: Mucous membranes are moist.     Pharynx: Oropharynx is clear.  Eyes:     General: No scleral icterus.       Right eye: No discharge.        Left eye: No discharge.     Extraocular Movements: Extraocular movements intact.     Conjunctiva/sclera: Conjunctivae normal.     Pupils: Pupils are equal, round, and reactive to light.  Cardiovascular:     Rate and Rhythm: Normal rate and regular rhythm.     Pulses: Normal pulses.     Heart sounds: Normal heart sounds. No murmur heard.    No friction rub. No gallop.  Pulmonary:     Effort: Pulmonary effort is normal. No respiratory distress.     Breath sounds: Normal breath sounds. No stridor. No wheezing, rhonchi or rales.  Chest:     Chest wall: No tenderness.  Musculoskeletal:        General: Normal range of motion.     Cervical back: Normal range of motion and neck supple.     Right lower leg: Edema (trace) present.     Left lower leg: Edema (trace) present.  Skin:    General: Skin is warm and dry.     Capillary Refill: Capillary refill takes less than 2 seconds.     Coloration: Skin is not jaundiced or pale.     Findings: No bruising, erythema, lesion or rash.  Neurological:     General: No focal deficit present.     Mental Status: She is alert and oriented to person, place, and time. Mental status is at baseline.  Psychiatric:        Mood and Affect: Mood normal.        Behavior: Behavior normal.        Thought Content: Thought content normal.        Judgment: Judgment normal.     Results for orders placed or performed during the hospital encounter of 04/18/22  Resp panel by RT-PCR (RSV, Flu A&B, Covid) Anterior Nasal Swab   Specimen: Anterior Nasal Swab  Result Value Ref Range   SARS Coronavirus 2 by RT PCR NEGATIVE NEGATIVE   Influenza A by PCR NEGATIVE NEGATIVE   Influenza B by PCR NEGATIVE NEGATIVE   Resp Syncytial Virus by PCR NEGATIVE NEGATIVE  Culture, blood (Routine  X 2) w Reflex to ID Panel   Specimen: BLOOD  Result Value Ref Range   Specimen Description BLOOD RIGHT ARM    Special Requests      BOTTLES DRAWN AEROBIC AND ANAEROBIC Blood Culture results may not be optimal due to an inadequate volume of blood received in culture bottles  Culture      NO GROWTH 5 DAYS Performed at [facility], [address] [place], [place] [address]    Report Status 04/23/2022 FINAL   Culture, blood (Routine X 2) w Reflex to ID Panel   Specimen: BLOOD  Result Value Ref Range   Specimen Description BLOOD  RIGHT ARM    Special Requests      BOTTLES DRAWN AEROBIC ONLY Blood Culture adequate volume   Culture      NO GROWTH 5 DAYS Performed at [facility], [address] [place], [place] [address]    Report Status 04/23/2022 FINAL   Comprehensive metabolic panel  Result Value Ref Range   Sodium 132 (L) 135 - 145 mmol/L   Potassium 3.4 (L) 3.5 - 5.1 mmol/L   Chloride 90 (L) 98 - 111 mmol/L   CO2 30 22 - 32 mmol/L   Glucose, Bld 109 (H) 70 - 99 mg/dL   BUN 16 8 - 23 mg/dL   Creatinine, Ser 0.98 0.44 - 1.00 mg/dL   Calcium 9.2 8.9 - 11.9 mg/dL   Total Protein 7.3 6.5 - 8.1 g/dL   Albumin 2.9 (L) 3.5 - 5.0 g/dL   AST 83 (H) 15 - 41 U/L   ALT 77 (H) 0 - 44 U/L   Alkaline Phosphatase 113 38 - 126 U/L   Total Bilirubin 0.7 0.3 - 1.2 mg/dL   GFR, Estimated >14 >78 mL/min   Anion gap 12 5 - 15  CBC  Result Value Ref Range   WBC 14.0 (H) 4.0 - 10.5 K/uL   RBC 3.06 (L) 3.87 - 5.11 MIL/uL   Hemoglobin 9.3 (L) 12.0 - 15.0 g/dL   HCT 29.5 (L) 62.1 - 30.8 %   MCV 93.8 80.0 - 100.0 fL   MCH 30.4 26.0 - 34.0 pg   MCHC 32.4 30.0 - 36.0 g/dL   RDW 65.7 (H) 84.6 - 96.2 %   Platelets 318 150 - 400 K/uL   nRBC 0.0 0.0 - 0.2 %  Brain natriuretic peptide  Result Value Ref Range   B Natriuretic Peptide 746.3 (H) 0.0 - 100.0 pg/mL  TSH  Result Value Ref Range   TSH 0.701 0.350 - 4.500 uIU/mL  T4, free  Result Value Ref Range   Free T4 1.31  (H) 0.61 - 1.12 ng/dL  Magnesium  Result Value Ref Range   Magnesium 2.1 1.7 - 2.4 mg/dL  Urinalysis, w/ Reflex to Culture (Infection Suspected) -Urine, Clean Catch  Result Value Ref Range   Specimen Source URINE, RANDOM    Color, Urine YELLOW (A) YELLOW   APPearance CLEAR (A) CLEAR   Specific Gravity, Urine 1.017 1.005 - 1.030   pH 6.0 5.0 - 8.0   Glucose, UA NEGATIVE NEGATIVE mg/dL   Hgb urine dipstick NEGATIVE NEGATIVE   Bilirubin Urine NEGATIVE NEGATIVE   Ketones, ur NEGATIVE NEGATIVE mg/dL   Protein, ur 30 (A) NEGATIVE mg/dL   Nitrite NEGATIVE NEGATIVE   Leukocytes,Ua NEGATIVE NEGATIVE   WBC, UA 0-5 0 - 5 WBC/hpf   Bacteria, UA NONE SEEN NONE SEEN   Squamous Epithelial / HPF 0-5 0 - 5 /HPF  Basic metabolic panel  Result Value Ref Range   Sodium 134 (L) 135 - 145 mmol/L   Potassium 4.2 3.5 - 5.1 mmol/L   Chloride 92 (L) 98 - 111 mmol/L   CO2 30 22 - 32 mmol/L   Glucose, Bld 112 (H) 70 - 99 mg/dL   BUN 17 8 -  23 mg/dL   Creatinine, Ser 6.57 0.44 - 1.00 mg/dL   Calcium 8.7 (L) 8.9 - 10.3 mg/dL   GFR, Estimated >84 >69 mL/min   Anion gap 12 5 - 15  CBC  Result Value Ref Range   WBC 11.1 (H) 4.0 - 10.5 K/uL   RBC 2.71 (L) 3.87 - 5.11 MIL/uL   Hemoglobin 8.3 (L) 12.0 - 15.0 g/dL   HCT 62.9 (L) 52.8 - 41.3 %   MCV 92.6 80.0 - 100.0 fL   MCH 30.6 26.0 - 34.0 pg   MCHC 33.1 30.0 - 36.0 g/dL   RDW 24.4 (H) 01.0 - 27.2 %   Platelets 317 150 - 400 K/uL   nRBC 0.0 0.0 - 0.2 %  Procalcitonin - Baseline  Result Value Ref Range   Procalcitonin 0.25 ng/mL  Basic metabolic panel  Result Value Ref Range   Sodium 133 (L) 135 - 145 mmol/L   Potassium 4.7 3.5 - 5.1 mmol/L   Chloride 92 (L) 98 - 111 mmol/L   CO2 33 (H) 22 - 32 mmol/L   Glucose, Bld 140 (H) 70 - 99 mg/dL   BUN 25 (H) 8 - 23 mg/dL   Creatinine, Ser 5.36 0.44 - 1.00 mg/dL   Calcium 8.8 (L) 8.9 - 10.3 mg/dL   GFR, Estimated >64 >40 mL/min   Anion gap 8 5 - 15  CBC  Result Value Ref Range   WBC 10.5 4.0 - 10.5  K/uL   RBC 2.62 (L) 3.87 - 5.11 MIL/uL   Hemoglobin 7.9 (L) 12.0 - 15.0 g/dL   HCT 34.7 (L) 42.5 - 95.6 %   MCV 94.3 80.0 - 100.0 fL   MCH 30.2 26.0 - 34.0 pg   MCHC 32.0 30.0 - 36.0 g/dL   RDW 38.7 (H) 56.4 - 33.2 %   Platelets 354 150 - 400 K/uL   nRBC 0.0 0.0 - 0.2 %  Magnesium  Result Value Ref Range   Magnesium 2.0 1.7 - 2.4 mg/dL  Troponin I (High Sensitivity)  Result Value Ref Range   Troponin I (High Sensitivity) 45 (H) <18 ng/L  Troponin I (High Sensitivity)  Result Value Ref Range   Troponin I (High Sensitivity) 35 (H) <18 ng/L      Assessment & Plan:   Problem List Items Addressed This Visit       Cardiovascular and Mediastinum   Essential hypertension    Better, but still running a little high. Follow up with cardiology in about 10 days. Will check labs today and await his decision on changing meds. Call with any concerns.       Relevant Medications   metoprolol tartrate (LOPRESSOR) 50 MG tablet   atorvastatin (LIPITOR) 40 MG tablet   hydrochlorothiazide (MICROZIDE) 12.5 MG capsule   isosorbide mononitrate (IMDUR) 60 MG 24 hr tablet   losartan (COZAAR) 25 MG tablet   metoprolol succinate (TOPROL-XL) 50 MG 24 hr tablet   hydrALAZINE (APRESOLINE) 25 MG tablet   Other Relevant Orders   CBC with Differential/Platelet   Comprehensive metabolic panel   Aortic atherosclerosis (HCC)    Will keep her BP and cholesterol under good control. Continue to monitor. Call with any concerns.       Relevant Medications   metoprolol tartrate (LOPRESSOR) 50 MG tablet   atorvastatin (LIPITOR) 40 MG tablet   hydrochlorothiazide (MICROZIDE) 12.5 MG capsule   isosorbide mononitrate (IMDUR) 60 MG 24 hr tablet   losartan (COZAAR) 25 MG tablet   metoprolol succinate (  TOPROL-XL) 50 MG 24 hr tablet   hydrALAZINE (APRESOLINE) 25 MG tablet   Other Relevant Orders   CBC with Differential/Platelet   Comprehensive metabolic panel   Lipid Panel w/o Chol/HDL Ratio     Respiratory    COPD, severe (HCC) - Primary   Relevant Medications   albuterol (PROVENTIL) (2.5 MG/3ML) 0.083% nebulizer solution   albuterol (VENTOLIN HFA) 108 (90 Base) MCG/ACT inhaler   Fluticasone-Umeclidin-Vilant (TRELEGY ELLIPTA) 100-62.5-25 MCG/ACT AEPB   Other Relevant Orders   CBC with Differential/Platelet   Comprehensive metabolic panel     Other   Hyperlipidemia    Under good control on current regimen. Continue current regimen. Continue to monitor. Call with any concerns. Refills given. Labs drawn today.       Relevant Medications   metoprolol tartrate (LOPRESSOR) 50 MG tablet   atorvastatin (LIPITOR) 40 MG tablet   hydrochlorothiazide (MICROZIDE) 12.5 MG capsule   isosorbide mononitrate (IMDUR) 60 MG 24 hr tablet   losartan (COZAAR) 25 MG tablet   metoprolol succinate (TOPROL-XL) 50 MG 24 hr tablet   hydrALAZINE (APRESOLINE) 25 MG tablet   Depression, recurrent (HCC)    Under good control on current regimen. Continue current regimen. Continue to monitor. Call with any concerns. Refills given.        Relevant Medications   mirtazapine (REMERON) 45 MG tablet   Other Relevant Orders   CBC with Differential/Platelet   Comprehensive metabolic panel     Follow up plan: Return in about 3 months (around 11/25/2022).

## 2022-08-25 NOTE — Assessment & Plan Note (Signed)
Better, but still running a little high. Follow up with cardiology in about 10 days. Will check labs today and await his decision on changing meds. Call with any concerns.

## 2022-08-26 LAB — CBC WITH DIFFERENTIAL/PLATELET
Basophils Absolute: 0.1 10*3/uL (ref 0.0–0.2)
Basos: 1 %
EOS (ABSOLUTE): 0.1 10*3/uL (ref 0.0–0.4)
Eos: 2 %
Hematocrit: 28.2 % — ABNORMAL LOW (ref 34.0–46.6)
Hemoglobin: 8.9 g/dL — ABNORMAL LOW (ref 11.1–15.9)
Immature Grans (Abs): 0 10*3/uL (ref 0.0–0.1)
Immature Granulocytes: 0 %
Lymphocytes Absolute: 2.5 10*3/uL (ref 0.7–3.1)
Lymphs: 43 %
MCH: 30.5 pg (ref 26.6–33.0)
MCHC: 31.6 g/dL (ref 31.5–35.7)
MCV: 97 fL (ref 79–97)
Monocytes Absolute: 0.5 10*3/uL (ref 0.1–0.9)
Monocytes: 8 %
Neutrophils Absolute: 2.7 10*3/uL (ref 1.4–7.0)
Neutrophils: 46 %
Platelets: 208 10*3/uL (ref 150–450)
RBC: 2.92 x10E6/uL — ABNORMAL LOW (ref 3.77–5.28)
RDW: 13.2 % (ref 11.7–15.4)
WBC: 5.9 10*3/uL (ref 3.4–10.8)

## 2022-08-26 LAB — COMPREHENSIVE METABOLIC PANEL
ALT: 34 IU/L — ABNORMAL HIGH (ref 0–32)
AST: 35 IU/L (ref 0–40)
Albumin: 3.6 g/dL — ABNORMAL LOW (ref 3.8–4.8)
Alkaline Phosphatase: 92 IU/L (ref 44–121)
BUN/Creatinine Ratio: 26 (ref 12–28)
BUN: 20 mg/dL (ref 8–27)
Bilirubin Total: <0.2 mg/dL (ref 0.0–1.2)
CO2: 28 mmol/L (ref 20–29)
Calcium: 9.2 mg/dL (ref 8.7–10.3)
Chloride: 100 mmol/L (ref 96–106)
Creatinine, Ser: 0.77 mg/dL (ref 0.57–1.00)
Globulin, Total: 2.2 g/dL (ref 1.5–4.5)
Glucose: 71 mg/dL (ref 70–99)
Potassium: 4 mmol/L (ref 3.5–5.2)
Sodium: 141 mmol/L (ref 134–144)
Total Protein: 5.8 g/dL — ABNORMAL LOW (ref 6.0–8.5)
eGFR: 81 mL/min/{1.73_m2} (ref >59)

## 2022-08-26 LAB — LIPID PANEL W/O CHOL/HDL RATIO
Cholesterol, Total: 149 mg/dL (ref 100–199)
HDL: 63 mg/dL (ref >39)
LDL Chol Calc (NIH): 71 mg/dL (ref 0–99)
Triglycerides: 80 mg/dL (ref 0–149)
VLDL Cholesterol Cal: 15 mg/dL (ref 5–40)

## 2022-08-29 ENCOUNTER — Ambulatory Visit: Payer: Self-pay | Admitting: *Deleted

## 2022-08-29 NOTE — Patient Outreach (Signed)
  Care Coordination   Follow Up Visit Note   08/30/2022 Name: Kristi Clark MRN: [id number] DOB: 09/25/1950  Kristi Clark is a 72 y.o. year old female who sees Dorcas Carrow, DO for primary care. I spoke with  Annabell Sabal by phone today.  What matters to the patients health and wellness today?  Determine reason for new shortness of breath and be able to go back to her own home (currently with daughter)    Goals Addressed             This Visit's Progress    Effective management of heart conditions   On track    Care Coordination Interventions: Counseled on increased risk of stroke due to Afib and benefits of anticoagulation for stroke prevention Counseled on bleeding risk associated with Plavix and aspirin and importance of self-monitoring for signs/symptoms of bleeding Counseled on avoidance of NSAIDs due to increased bleeding risk with anticoagulants Provided education on low sodium diet Advised patient to weigh each morning after emptying bladder Discussed importance of daily weight and advised patient to weigh and record daily Evaluation of current treatment plan related to hypertension self management and patient's adherence to plan as established by provider Reviewed medications with patient and discussed importance of compliance Discussed plans with patient for ongoing care management follow up and provided patient with direct contact information for care management team          SDOH assessments and interventions completed:  No     Care Coordination Interventions:  Yes, provided   Interventions Today    Flowsheet Row Most Recent Value  Chronic Disease   Chronic disease during today's visit Congestive Heart Failure (CHF), Atrial Fibrillation (AFib)  [Having more shortness of breath, will have more tests done to identify issue]  General Interventions   General Interventions Discussed/Reviewed General Interventions Reviewed, Durable Medical  Equipment (DME), Doctor Visits, Communication with  Doctor Visits Discussed/Reviewed Doctor Visits Reviewed, PCP, Specialist  [Echo done 6/10, cardiology 7/1, pulmonary 9/19, PCP 9/20]  Durable Medical Equipment (DME) BP Cuff, Oxygen, Walker  [Monitoring blood pressure, heart rate, and oxygen levels.  Using 3L at night and during the day with activity.  Noted that rollator was ordered, waiting for MD signature.]  PCP/Specialist Visits Compliance with follow-up visit  Communication with PCP/Specialists  [CMA at PCP office notified that patient's preferred DME company is [employer]]  Education Interventions   Education Provided Provided Education  Provided Verbal Education On Nutrition, When to see the doctor, Medication  [Reviewed medication changes that were made to keep BP controlled]  Nutrition Interventions   Nutrition Discussed/Reviewed Nutrition Reviewed, Fluid intake, Decreasing salt       Follow up plan: Follow up call scheduled for 7/8    Encounter Outcome:  Pt. Visit Completed   Kemper Durie, RN, MSN, [facility] Care Management Care Management Coordinator [phone]

## 2022-08-30 NOTE — Telephone Encounter (Addendum)
Bara from [employer] stated she is calling regarding an order they received for a walker. Stated unfortunately pt received walker in Feb. due to insurance it was denied.  If you want to justify that walker is not good enough, send a new order.   Please advise.

## 2022-08-31 NOTE — Telephone Encounter (Signed)
Please advise 

## 2022-09-05 ENCOUNTER — Ambulatory Visit (INDEPENDENT_AMBULATORY_CARE_PROVIDER_SITE_OTHER): Payer: Medicare Other | Admitting: Cardiovascular Disease

## 2022-09-05 ENCOUNTER — Encounter: Payer: Self-pay | Admitting: Cardiovascular Disease

## 2022-09-05 VITALS — BP 130/40 | HR 65 | Ht 59.0 in | Wt 101.6 lb

## 2022-09-05 DIAGNOSIS — I4891 Unspecified atrial fibrillation: Secondary | ICD-10-CM

## 2022-09-05 DIAGNOSIS — J9621 Acute and chronic respiratory failure with hypoxia: Secondary | ICD-10-CM | POA: Diagnosis not present

## 2022-09-05 DIAGNOSIS — I2510 Atherosclerotic heart disease of native coronary artery without angina pectoris: Secondary | ICD-10-CM | POA: Diagnosis not present

## 2022-09-05 DIAGNOSIS — Z01810 Encounter for preprocedural cardiovascular examination: Secondary | ICD-10-CM | POA: Diagnosis not present

## 2022-09-05 DIAGNOSIS — I10 Essential (primary) hypertension: Secondary | ICD-10-CM | POA: Diagnosis not present

## 2022-09-05 DIAGNOSIS — I5033 Acute on chronic diastolic (congestive) heart failure: Secondary | ICD-10-CM

## 2022-09-05 DIAGNOSIS — I700 Atherosclerosis of aorta: Secondary | ICD-10-CM

## 2022-09-05 NOTE — Progress Notes (Addendum)
Cardiology Office Note   Date:  09/05/2022   ID:  Kristi Clark, DOB Jul 25, 1950, MRN [id number]  PCP:  Dorcas Carrow, DO  Cardiologist:  Adrian Blackwater, MD      History of Present Illness: Kristi Clark is a 72 y.o. female who presents for  Chief Complaint  Patient presents with   Follow-up    F/U    Has DOE  Shortness of Breath This is a chronic problem. The current episode started more than 1 year ago. The problem has been unchanged.      Past Medical History:  Diagnosis Date   AK (actinic keratosis) 10/25/2017   left forehead   Arrhythmia    atrial fibrillation   BCC (basal cell carcinoma) 10/25/2017   left upper lateral eyelid, Moh's   CHF (congestive heart failure) (HCC)    COPD (chronic obstructive pulmonary disease) (HCC)    Coronary artery disease    Depression    Dysrhythmia    GERD (gastroesophageal reflux disease)    Hyperlipidemia    Hypertension    Menopause    Osteopenia    Severe sepsis (HCC) 07/14/2014   Tobacco abuse      Past Surgical History:  Procedure Laterality Date   CARDIAC CATHETERIZATION     CHOLECYSTECTOMY  2014   COLONOSCOPY WITH PROPOFOL N/A 01/16/2018   Procedure: COLONOSCOPY WITH PROPOFOL;  Surgeon: Christena Deem, MD;  Location: [facility] ENDOSCOPY;  Service: Endoscopy;  Laterality: N/A;   CORONARY ANGIOPLASTY     ESOPHAGOGASTRODUODENOSCOPY (EGD) WITH PROPOFOL N/A 01/16/2018   Procedure: ESOPHAGOGASTRODUODENOSCOPY (EGD) WITH PROPOFOL;  Surgeon: Christena Deem, MD;  Location: [facility] ENDOSCOPY;  Service: Endoscopy;  Laterality: N/A;   heart stent     LEFT HEART CATH AND CORONARY ANGIOGRAPHY N/A 09/17/2021   Procedure: LEFT HEART CATH AND CORONARY ANGIOGRAPHY;  Surgeon: Laurier Nancy, MD;  Location: [facility] INVASIVE CV LAB;  Service: Cardiovascular;  Laterality: N/A;   TEE WITHOUT CARDIOVERSION N/A 05/11/2021   Procedure: TRANSESOPHAGEAL ECHOCARDIOGRAM (TEE);  Surgeon: Lamar Blinks, MD;  Location: [facility] ORS;   Service: Cardiovascular;  Laterality: N/A;   TOTAL ABDOMINAL HYSTERECTOMY       Current Outpatient Medications  Medication Sig Dispense Refill   albuterol (PROVENTIL) (2.5 MG/3ML) 0.083% nebulizer solution Take 3 mLs (2.5 mg total) by nebulization every 4 (four) hours as needed for wheezing or shortness of breath. 570 mL 1   albuterol (VENTOLIN HFA) 108 (90 Base) MCG/ACT inhaler Inhale 2 puffs into the lungs every 6 (six) hours as needed for wheezing or shortness of breath. 18 g 3   Aloe-Sodium Chloride (AYR SALINE NASAL GEL NA) Place 1 Application into the nose as needed (dry nares).     amiodarone (PACERONE) 200 MG tablet Take 1 tablet by mouth once daily 30 tablet 0   amLODipine (NORVASC) 5 MG tablet Take 1 tablet (5 mg total) by mouth daily. 30 tablet 1   aspirin 81 MG tablet Take 81 mg by mouth daily.     atorvastatin (LIPITOR) 40 MG tablet Take 1 tablet (40 mg total) by mouth daily. 90 tablet 1   Blood Pressure Monitoring (BLOOD PRESSURE MONITOR/WRIST) KIT Take Blood pressure as needed, Dx: I12.9 1 kit 0   cetirizine (ZYRTEC) 10 MG tablet Take 10 mg by mouth daily.     clopidogrel (PLAVIX) 75 MG tablet Take 1 tablet (75 mg total) by mouth daily. 90 tablet 1   diclofenac Sodium (VOLTAREN) 1 % GEL Apply 4  g topically 4 (four) times daily. 350 g 3   feeding supplement (ENSURE ENLIVE / ENSURE PLUS) LIQD Take 237 mLs by mouth 2 (two) times daily between meals. 237 mL 12   ferrous sulfate 325 (65 FE) MG tablet Take 1 tablet (325 mg total) by mouth daily with breakfast. 30 tablet 3   Fluticasone-Umeclidin-Vilant (TRELEGY ELLIPTA) 100-62.5-25 MCG/ACT AEPB 100 mcg DAILY (route: inhalation) 3 each 4   furosemide (LASIX) 20 MG tablet Take 1 tablet (20 mg total) by mouth daily. 5 tablet 0   hydrALAZINE (APRESOLINE) 25 MG tablet Take 1 tablet (25 mg total) by mouth 2 (two) times daily. 90 tablet 1   hydrochlorothiazide (MICROZIDE) 12.5 MG capsule Take 1 capsule (12.5 mg total) by mouth daily as  needed. 90 capsule 1   isosorbide mononitrate (IMDUR) 60 MG 24 hr tablet Take 1 tablet (60 mg total) by mouth 2 (two) times daily. 90 tablet 1   losartan (COZAAR) 25 MG tablet Take 1 tablet (25 mg total) by mouth daily. 90 tablet 1   metoprolol succinate (TOPROL-XL) 50 MG 24 hr tablet Take 1 tablet (50 mg total) by mouth daily. Take with or immediately following a meal. 90 tablet 1   metoprolol tartrate (LOPRESSOR) 50 MG tablet Take by mouth.     mirtazapine (REMERON) 45 MG tablet Take 1 tablet (45 mg total) by mouth at bedtime. 90 tablet 1   Multiple Vitamin (MULTIVITAMIN WITH MINERALS) TABS tablet Take 1 tablet by mouth daily.     nystatin (MYCOSTATIN) 100000 UNIT/ML suspension Take 5 mLs (500,000 Units total) by mouth 4 (four) times daily. 120 mL 0   pantoprazole (PROTONIX) 40 MG tablet Take 1 tablet (40 mg total) by mouth 2 (two) times daily. 180 tablet 1   polyethylene glycol powder (GLYCOLAX/MIRALAX) 17 GM/SCOOP powder Take 17 g by mouth 3 (three) times daily. 3350 g 3   QUEtiapine (SEROQUEL XR) 50 MG TB24 24 hr tablet Take 2 tablets (100 mg total) by mouth at bedtime. 180 tablet 1   No current facility-administered medications for this visit.    Allergies:   Clindamycin/lincomycin, Prednisone, Amoxicillin, Avelox [moxifloxacin hcl in nacl], Codeine sulfate, Penicillins, and Sulfa antibiotics    Social History:   reports that she has quit smoking. Her smoking use included cigarettes. She has a 56.00 pack-year smoking history. She has never used smokeless tobacco. She reports that she does not drink alcohol and does not use drugs.   Family History:  family history includes Asthma in her son; Breast cancer in her paternal aunt; Breast cancer (age of onset: 29) in her sister; Cancer in her maternal grandmother, mother, sister, and son; Diabetes in her brother, brother, daughter, and sister; Heart disease in her brother and father; Hyperlipidemia in her father; Hypertension in her daughter;  Stroke in her mother.    ROS:     Review of Systems  Constitutional: Negative.   HENT: Negative.    Eyes: Negative.   Respiratory:  Positive for shortness of breath.   Gastrointestinal: Negative.   Genitourinary: Negative.   Musculoskeletal: Negative.   Skin: Negative.   Neurological: Negative.   Endo/Heme/Allergies: Negative.   Psychiatric/Behavioral: Negative.    All other systems reviewed and are negative.     All other systems are reviewed and negative.    PHYSICAL EXAM: VS:  BP (!) 130/40   Pulse 65   Ht 4\' 11"  (1.499 m)   Wt 101 lb 9.6 oz (46.1 kg)   LMP  (LMP  Unknown)   SpO2 92%   BMI 20.52 kg/m  , BMI Body mass index is 20.52 kg/m. Last weight:  Wt Readings from Last 3 Encounters:  09/05/22 101 lb 9.6 oz (46.1 kg)  08/25/22 102 lb 6.4 oz (46.4 kg)  08/08/22 97 lb (44 kg)     Physical Exam Constitutional:      Appearance: Normal appearance.  Cardiovascular:     Rate and Rhythm: Normal rate and regular rhythm.     Heart sounds: Normal heart sounds.  Pulmonary:     Effort: Pulmonary effort is normal.     Breath sounds: Normal breath sounds.  Musculoskeletal:     Right lower leg: No edema.     Left lower leg: No edema.  Neurological:     Mental Status: She is alert.       EKG:   Recent Labs: 04/18/2022: B Natriuretic Peptide 746.3; TSH 0.701 04/21/2022: Magnesium 2.0 08/25/2022: ALT 34; BUN 20; Creatinine, Ser 0.77; Hemoglobin 8.9; Platelets 208; Potassium 4.0; Sodium 141    Lipid Panel    Component Value Date/Time   CHOL 149 08/25/2022 1022   CHOL 148 03/06/2015 0831   TRIG 80 08/25/2022 1022   TRIG 100 03/06/2015 0831   HDL 63 08/25/2022 1022   CHOLHDL 2.1 01/15/2022 0640   VLDL 11 01/15/2022 0640   VLDL 20 03/06/2015 0831   LDLCALC 71 08/25/2022 1022      Other studies Reviewed: Additional studies/ records that were reviewed today include:  Review of the above records demonstrates:       No data to display             ASSESSMENT AND PLAN:    ICD-10-CM   1. Acute on chronic diastolic CHF (congestive heart failure) (HCC)  I50.33    On O2, but DOE, no chest pain. Can have cataract surgery. ECHO had normal LVEF, diastolic dysfunction, trace MR/TR    2. Aortic atherosclerosis (HCC)  I70.0     3. Atrial fibrillation with RVR (HCC)  I48.91     4. Coronary artery disease involving native coronary artery of native heart without angina pectoris  I25.10     5. Essential hypertension  I10     6. Acute on chronic hypoxic respiratory failure (HCC)  J96.21     7. Preoperative cardiovascular examination  Z01.810    May have cataract surgery as ,low risk, no need to stop plavix       Problem List Items Addressed This Visit       Cardiovascular and Mediastinum   Essential hypertension   CAD (coronary artery disease)   Aortic atherosclerosis (HCC)   Atrial fibrillation with RVR (HCC)   Acute on chronic diastolic CHF (congestive heart failure) (HCC) - Primary     Respiratory   Acute on chronic hypoxic respiratory failure (HCC)   Other Visit Diagnoses     Preoperative cardiovascular examination       May have cataract surgery as ,low risk, no need to stop plavix          Disposition:   Return in about 4 weeks (around 10/03/2022).    Total time spent: 30 minutes  Signed,  Adrian Blackwater, MD  09/05/2022 10:04 AM    [facility]

## 2022-09-07 ENCOUNTER — Other Ambulatory Visit: Payer: Self-pay | Admitting: Family Medicine

## 2022-09-07 ENCOUNTER — Telehealth: Payer: Self-pay | Admitting: Family Medicine

## 2022-09-07 DIAGNOSIS — W19XXXA Unspecified fall, initial encounter: Secondary | ICD-10-CM

## 2022-09-07 DIAGNOSIS — J449 Chronic obstructive pulmonary disease, unspecified: Secondary | ICD-10-CM

## 2022-09-07 DIAGNOSIS — M7989 Other specified soft tissue disorders: Secondary | ICD-10-CM

## 2022-09-07 DIAGNOSIS — R0602 Shortness of breath: Secondary | ICD-10-CM

## 2022-09-07 NOTE — Telephone Encounter (Signed)
Copied from CRM [id number]. Topic: General - Other >> Sep 07, 2022  9:47 AM Clide Dales wrote: Patient's daughter called and stated that patients insurance will not cover the walker that was ordered unless the order states Rollator walker. Can order be resent for a rollator?

## 2022-09-07 NOTE — Telephone Encounter (Signed)
Order signed and faxed.

## 2022-09-07 NOTE — Telephone Encounter (Signed)
New order printed for provider to sign.

## 2022-09-12 ENCOUNTER — Ambulatory Visit: Payer: Self-pay | Admitting: *Deleted

## 2022-09-12 NOTE — Patient Outreach (Signed)
  Care Coordination   09/12/2022 Name: Kristi Clark MRN: [id number] DOB: 23-Sep-1950   Care Coordination Outreach Attempts:  An unsuccessful telephone outreach was attempted for a scheduled appointment today.  Follow Up Plan:  Additional outreach attempts will be made to offer the patient care coordination information and services.   Encounter Outcome:  No Answer   Care Coordination Interventions:  No, not indicated    Kemper Durie, RN, MSN, [facility] Care Management Care Management Coordinator [phone]

## 2022-09-25 ENCOUNTER — Other Ambulatory Visit: Payer: Self-pay | Admitting: Cardiovascular Disease

## 2022-10-03 ENCOUNTER — Ambulatory Visit: Payer: Medicare Other | Admitting: Cardiovascular Disease

## 2022-10-03 ENCOUNTER — Other Ambulatory Visit: Payer: Self-pay

## 2022-10-03 MED ORDER — AMIODARONE HCL 200 MG PO TABS: 200.0000 mg | ORAL_TABLET | Freq: Every day | ORAL | 1 refills | Status: DC

## 2022-10-04 ENCOUNTER — Ambulatory Visit: Payer: Self-pay | Admitting: *Deleted

## 2022-10-04 NOTE — Patient Outreach (Signed)
  Care Coordination   Follow Up Visit Note   10/05/2022 Name: Kristi Clark MRN: [id number] DOB: Sep 03, 1950  Kristi Clark is a 72 y.o. year old female who sees Dorcas Carrow, DO for primary care. I spoke with  Annabell Sabal by phone today.  What matters to the patients health and wellness today?  Obtain rollator and follow up with cardiology regarding intermittent shortness of breath    Goals Addressed             This Visit's Progress    Effective management of heart conditions   On track    Care Coordination Interventions: Counseled on increased risk of stroke due to Afib and benefits of anticoagulation for stroke prevention Counseled on bleeding risk associated with Plavix and aspirin and importance of self-monitoring for signs/symptoms of bleeding Counseled on avoidance of NSAIDs due to increased bleeding risk with anticoagulants Provided education on low sodium diet Advised patient to weigh each morning after emptying bladder Discussed importance of daily weight and advised patient to weigh and record daily Evaluation of current treatment plan related to hypertension self management and patient's adherence to plan as established by provider Reviewed medications with patient and discussed importance of compliance Discussed plans with patient for ongoing care management follow up and provided patient with direct contact information for care management team          SDOH assessments and interventions completed:  No     Care Coordination Interventions:  Yes, provided   Interventions Today    Flowsheet Row Most Recent Value  Chronic Disease   Chronic disease during today's visit Congestive Heart Failure (CHF), Atrial Fibrillation (AFib), Hypertension (HTN)  General Interventions   General Interventions Discussed/Reviewed General Interventions Reviewed, Durable Medical Equipment (DME), Doctor Visits, Communication with  Doctor Visits Discussed/Reviewed  Doctor Visits Reviewed, Specialist  [cardiology 8/6]  Durable Medical Equipment (DME) Walker  PCP/Specialist Visits Compliance with follow-up visit  Communication with --  [facility] regarding rollator, informed that patient was provided a standard walker, will need to return that one to get rollator.  Patient aware, will have daughter exchange it]  Exercise Interventions   Exercise Discussed/Reviewed Weight Managment  Weight Management Weight maintenance  Education Interventions   Education Provided Provided Education  Nutrition Interventions   Nutrition Discussed/Reviewed Nutrition Reviewed, Adding fruits and vegetables, Fluid intake, Decreasing salt       Follow up plan: Follow up call scheduled for 8/28    Encounter Outcome:  Pt. Visit Completed   Kemper Durie, RN, MSN, [facility] Care Management Care Management Coordinator [phone]

## 2022-10-11 ENCOUNTER — Encounter: Payer: Self-pay | Admitting: Cardiovascular Disease

## 2022-10-11 ENCOUNTER — Ambulatory Visit (INDEPENDENT_AMBULATORY_CARE_PROVIDER_SITE_OTHER): Payer: Medicare Other | Admitting: Cardiovascular Disease

## 2022-10-11 VITALS — BP 120/55 | HR 65 | Ht 59.0 in | Wt 107.4 lb

## 2022-10-11 DIAGNOSIS — I10 Essential (primary) hypertension: Secondary | ICD-10-CM | POA: Diagnosis not present

## 2022-10-11 DIAGNOSIS — I5033 Acute on chronic diastolic (congestive) heart failure: Secondary | ICD-10-CM

## 2022-10-11 DIAGNOSIS — I4891 Unspecified atrial fibrillation: Secondary | ICD-10-CM

## 2022-10-11 DIAGNOSIS — I2510 Atherosclerotic heart disease of native coronary artery without angina pectoris: Secondary | ICD-10-CM | POA: Diagnosis not present

## 2022-10-11 DIAGNOSIS — I340 Nonrheumatic mitral (valve) insufficiency: Secondary | ICD-10-CM | POA: Diagnosis not present

## 2022-10-11 MED ORDER — FUROSEMIDE 20 MG PO TABS: ORAL_TABLET | ORAL | 0 refills | Status: DC

## 2022-10-11 NOTE — Progress Notes (Signed)
Cardiology Office Note   Date:  10/11/2022   ID:  Kristi Clark, DOB 1950-03-15, MRN [id number]  PCP:  Dorcas Carrow, DO  Cardiologist:  Adrian Blackwater, MD      History of Present Illness: Kristi Clark is a 72 y.o. female who presents for  Chief Complaint  Patient presents with   Follow-up    4 week f/u    Has swelling of legs occasionally.  Dizziness This is a chronic problem. The current episode started more than 1 month ago. The problem has been unchanged.      Past Medical History:  Diagnosis Date   AK (actinic keratosis) 10/25/2017   left forehead   Arrhythmia    atrial fibrillation   BCC (basal cell carcinoma) 10/25/2017   left upper lateral eyelid, Moh's   CHF (congestive heart failure) (HCC)    COPD (chronic obstructive pulmonary disease) (HCC)    Coronary artery disease    Depression    Dysrhythmia    GERD (gastroesophageal reflux disease)    Hyperlipidemia    Hypertension    Menopause    Osteopenia    Severe sepsis (HCC) 07/14/2014   Tobacco abuse      Past Surgical History:  Procedure Laterality Date   CARDIAC CATHETERIZATION     CHOLECYSTECTOMY  2014   COLONOSCOPY WITH PROPOFOL N/A 01/16/2018   Procedure: COLONOSCOPY WITH PROPOFOL;  Surgeon: Christena Deem, MD;  Location: [facility] ENDOSCOPY;  Service: Endoscopy;  Laterality: N/A;   CORONARY ANGIOPLASTY     ESOPHAGOGASTRODUODENOSCOPY (EGD) WITH PROPOFOL N/A 01/16/2018   Procedure: ESOPHAGOGASTRODUODENOSCOPY (EGD) WITH PROPOFOL;  Surgeon: Christena Deem, MD;  Location: [facility] ENDOSCOPY;  Service: Endoscopy;  Laterality: N/A;   heart stent     LEFT HEART CATH AND CORONARY ANGIOGRAPHY N/A 09/17/2021   Procedure: LEFT HEART CATH AND CORONARY ANGIOGRAPHY;  Surgeon: Laurier Nancy, MD;  Location: [facility] INVASIVE CV LAB;  Service: Cardiovascular;  Laterality: N/A;   TEE WITHOUT CARDIOVERSION N/A 05/11/2021   Procedure: TRANSESOPHAGEAL ECHOCARDIOGRAM (TEE);  Surgeon: Lamar Blinks, MD;   Location: [facility] ORS;  Service: Cardiovascular;  Laterality: N/A;   TOTAL ABDOMINAL HYSTERECTOMY       Current Outpatient Medications  Medication Sig Dispense Refill   albuterol (PROVENTIL) (2.5 MG/3ML) 0.083% nebulizer solution Take 3 mLs (2.5 mg total) by nebulization every 4 (four) hours as needed for wheezing or shortness of breath. 570 mL 1   albuterol (VENTOLIN HFA) 108 (90 Base) MCG/ACT inhaler Inhale 2 puffs into the lungs every 6 (six) hours as needed for wheezing or shortness of breath. 18 g 3   Aloe-Sodium Chloride (AYR SALINE NASAL GEL NA) Place 1 Application into the nose as needed (dry nares).     amiodarone (PACERONE) 200 MG tablet Take 1 tablet (200 mg total) by mouth daily. 90 tablet 1   amLODipine (NORVASC) 5 MG tablet Take 1 tablet (5 mg total) by mouth daily. 30 tablet 1   aspirin 81 MG tablet Take 81 mg by mouth daily.     atorvastatin (LIPITOR) 40 MG tablet Take 1 tablet (40 mg total) by mouth daily. 90 tablet 1   Blood Pressure Monitoring (BLOOD PRESSURE MONITOR/WRIST) KIT Take Blood pressure as needed, Dx: I12.9 1 kit 0   cetirizine (ZYRTEC) 10 MG tablet Take 10 mg by mouth daily.     clopidogrel (PLAVIX) 75 MG tablet Take 1 tablet (75 mg total) by mouth daily. 90 tablet 1   diclofenac Sodium (VOLTAREN)  1 % GEL Apply 4 g topically 4 (four) times daily. 350 g 3   feeding supplement (ENSURE ENLIVE / ENSURE PLUS) LIQD Take 237 mLs by mouth 2 (two) times daily between meals. 237 mL 12   ferrous sulfate 325 (65 FE) MG tablet Take 1 tablet (325 mg total) by mouth daily with breakfast. 30 tablet 3   Fluticasone-Umeclidin-Vilant (TRELEGY ELLIPTA) 100-62.5-25 MCG/ACT AEPB 100 mcg DAILY (route: inhalation) 3 each 4   hydrALAZINE (APRESOLINE) 25 MG tablet Take 1 tablet (25 mg total) by mouth 2 (two) times daily. 90 tablet 1   isosorbide mononitrate (IMDUR) 60 MG 24 hr tablet Take 1 tablet (60 mg total) by mouth 2 (two) times daily. 90 tablet 1   losartan (COZAAR) 25 MG tablet Take  1 tablet (25 mg total) by mouth daily. 90 tablet 1   metoprolol succinate (TOPROL-XL) 50 MG 24 hr tablet Take 1 tablet (50 mg total) by mouth daily. Take with or immediately following a meal. 90 tablet 1   metoprolol tartrate (LOPRESSOR) 50 MG tablet Take by mouth.     mirtazapine (REMERON) 45 MG tablet Take 1 tablet (45 mg total) by mouth at bedtime. 90 tablet 1   Multiple Vitamin (MULTIVITAMIN WITH MINERALS) TABS tablet Take 1 tablet by mouth daily.     nystatin (MYCOSTATIN) 100000 UNIT/ML suspension Take 5 mLs (500,000 Units total) by mouth 4 (four) times daily. 120 mL 0   pantoprazole (PROTONIX) 40 MG tablet Take 1 tablet (40 mg total) by mouth 2 (two) times daily. 180 tablet 1   polyethylene glycol powder (GLYCOLAX/MIRALAX) 17 GM/SCOOP powder Take 17 g by mouth 3 (three) times daily. 3350 g 3   QUEtiapine (SEROQUEL XR) 50 MG TB24 24 hr tablet Take 2 tablets (100 mg total) by mouth at bedtime. 180 tablet 1   furosemide (LASIX) 20 MG tablet AS needed 5 tablet 0   No current facility-administered medications for this visit.    Allergies:   Clindamycin/lincomycin, Prednisone, Amoxicillin, Avelox [moxifloxacin hcl in nacl], Codeine sulfate, Penicillins, and Sulfa antibiotics    Social History:   reports that she has quit smoking. Her smoking use included cigarettes. She has a 56 pack-year smoking history. She has never used smokeless tobacco. She reports that she does not drink alcohol and does not use drugs.   Family History:  family history includes Asthma in her son; Breast cancer in her paternal aunt; Breast cancer (age of onset: 110) in her sister; Cancer in her maternal grandmother, mother, sister, and son; Diabetes in her brother, brother, daughter, and sister; Heart disease in her brother and father; Hyperlipidemia in her father; Hypertension in her daughter; Stroke in her mother.    ROS:     Review of Systems  Constitutional: Negative.   HENT: Negative.    Eyes: Negative.    Respiratory: Negative.    Gastrointestinal: Negative.   Genitourinary: Negative.   Musculoskeletal: Negative.   Skin: Negative.   Neurological:  Positive for dizziness.  Endo/Heme/Allergies: Negative.   Psychiatric/Behavioral: Negative.    All other systems reviewed and are negative.     All other systems are reviewed and negative.    PHYSICAL EXAM: VS:  BP (!) 120/55   Pulse 65   Ht 4\' 11"  (1.499 m)   Wt 107 lb 6.4 oz (48.7 kg)   LMP  (LMP Unknown)   SpO2 90%   BMI 21.69 kg/m  , BMI Body mass index is 21.69 kg/m. Last weight:  Wt Readings from  Last 3 Encounters:  10/11/22 107 lb 6.4 oz (48.7 kg)  09/05/22 101 lb 9.6 oz (46.1 kg)  08/25/22 102 lb 6.4 oz (46.4 kg)     Physical Exam Constitutional:      Appearance: Normal appearance.  Cardiovascular:     Rate and Rhythm: Normal rate and regular rhythm.     Heart sounds: Normal heart sounds.  Pulmonary:     Effort: Pulmonary effort is normal.     Breath sounds: Normal breath sounds.  Musculoskeletal:     Right lower leg: No edema.     Left lower leg: No edema.  Neurological:     Mental Status: She is alert.       EKG:   Recent Labs: 04/18/2022: B Natriuretic Peptide 746.3; TSH 0.701 04/21/2022: Magnesium 2.0 08/25/2022: ALT 34; BUN 20; Creatinine, Ser 0.77; Hemoglobin 8.9; Platelets 208; Potassium 4.0; Sodium 141    Lipid Panel    Component Value Date/Time   CHOL 149 08/25/2022 1022   CHOL 148 03/06/2015 0831   TRIG 80 08/25/2022 1022   TRIG 100 03/06/2015 0831   HDL 63 08/25/2022 1022   CHOLHDL 2.1 01/15/2022 0640   VLDL 11 01/15/2022 0640   VLDL 20 03/06/2015 0831   LDLCALC 71 08/25/2022 1022      Other studies Reviewed: Additional studies/ records that were reviewed today include:  Review of the above records demonstrates:       No data to display            ASSESSMENT AND PLAN:    ICD-10-CM   1. Acute on chronic diastolic CHF (congestive heart failure) (HCC)  I50.33    STABLE     2. Atrial fibrillation with RVR (HCC)  I48.91     3. Coronary artery disease involving native coronary artery of native heart without angina pectoris  I25.10     4. Essential hypertension  I10    has low diastolic BP, will stop HCTZ    5. Nonrheumatic mitral valve regurgitation  I34.0    MILD ON ECHO< WITH NORMAL EF       Problem List Items Addressed This Visit       Cardiovascular and Mediastinum   Essential hypertension   Relevant Medications   furosemide (LASIX) 20 MG tablet   CAD (coronary artery disease)   Relevant Medications   furosemide (LASIX) 20 MG tablet   Atrial fibrillation with RVR (HCC)   Relevant Medications   furosemide (LASIX) 20 MG tablet   Acute on chronic diastolic CHF (congestive heart failure) (HCC) - Primary   Relevant Medications   furosemide (LASIX) 20 MG tablet   Other Visit Diagnoses     Nonrheumatic mitral valve regurgitation       MILD ON ECHO< WITH NORMAL EF   Relevant Medications   furosemide (LASIX) 20 MG tablet          Disposition:   No follow-ups on file.    Total time spent: 30 minutes  Signed,  Adrian Blackwater, MD  10/11/2022 10:39 AM    [facility]

## 2022-10-12 ENCOUNTER — Ambulatory Visit: Payer: Self-pay

## 2022-10-12 NOTE — Telephone Encounter (Signed)
  Chief Complaint: covid exposure Monday requesting testing per patient daughter Symptoms: denies sx . Does report has been feeling tired hx heart and pulmonary issues. Patient sleeping in same bed with now known covid positive person Frequency: exposed Monday  Pertinent Negatives: Patient denies sx of covid other than feeling tired Disposition: [] ED /[] Urgent Care (no appt availability in office) / [x] Appointment(In office/virtual)/ []  [facility]/ [] Home Care/ [] Refused Recommended Disposition /[] [facility]/ []  Follow-up with PCP Additional Notes:   Appt scheduled for tomorrow. No appt available with PCP until Friday. Please advise if at home testing needing today due to exposure Monday .   Reason for Disposition  [1] COVID-19 EXPOSURE within last 14 days AND [2] weak immune system (e.g., HIV positive, cancer chemo, splenectomy, organ transplant, chronic steroids) AND [3] NO symptoms  Answer Assessment - Initial Assessment Questions 1. COVID-19 EXPOSURE: "Please describe how you were exposed to someone with a COVID-19 infection."     Slept in same bed as covid positive person  2. PLACE of CONTACT: "Where were you when you were exposed to COVID-19?" (e.g., home, school, medical waiting room; which city?)     Home  3. TYPE of CONTACT: "How much contact was there?" (e.g., sitting next to, live in same house, work in same office, same building)     Same house  4. DURATION of CONTACT: "How long were you in contact with the COVID-19 patient?" (e.g., a few seconds, passed by person, a few minutes, 15 minutes or longer, live with the patient)     Longer than 15 minutes  5. MASK: "Were you wearing a mask?" "Was the other person wearing a mask?" Note: wearing a mask reduces the risk of an otherwise close contact.     na 6. DATE of CONTACT: "When did you have contact with a COVID-19 patient?" (e.g., how many days ago)     Monday 10/11/22 7. COMMUNITY SPREAD: "Do you live in or  have you traveled to an area where there are lots of COVID-19 cases (community spread)?" (See public health department website, if unsure)       Na  8. SYMPTOMS: "Do you have any symptoms?" (e.g., fever, cough, breathing difficulty, loss of taste or smell)     No sx but does report feeling tired. 9. VACCINE: "Have you gotten the COVID-19 vaccine?" If Yes, ask: "Which one, how many shots, when did you get it?"     Yes  10. PREGNANCY OR POSTPARTUM: "Is there any chance you are pregnant?" "When was your last menstrual period?" "Did you deliver in the last 2 weeks?"       na 11. HIGH RISK: "Do you have any heart or lung problems?" (e.g., asthma, COPD, heart failure) "Do you have a weak immune system or other risk factors?" (e.g., HIV positive, chemotherapy, renal failure, diabetes mellitus, sickle cell anemia, obesity)       Hx heart and lung issues  Protocols used: Coronavirus (COVID-19) Exposure-A-AH

## 2022-10-12 NOTE — Telephone Encounter (Signed)
Pt has an appointment tomorrrow 10/13/2022 @ 3:20 pm.

## 2022-10-12 NOTE — Telephone Encounter (Signed)
Patients daughter [name] called stated her mother was exposed to Covid on Monday but not showing symptoms and she wants to know if she needs to bring her in as a preventative measure. Please f/u with daughter    Left message to call back.

## 2022-10-13 ENCOUNTER — Ambulatory Visit: Admitting: Family Medicine

## 2022-10-14 ENCOUNTER — Ambulatory Visit (INDEPENDENT_AMBULATORY_CARE_PROVIDER_SITE_OTHER): Payer: Medicare Other | Admitting: Physician Assistant

## 2022-10-14 ENCOUNTER — Encounter: Payer: Self-pay | Admitting: Physician Assistant

## 2022-10-14 VITALS — BP 136/48 | HR 60 | Ht 59.0 in | Wt 107.8 lb

## 2022-10-14 DIAGNOSIS — Z20822 Contact with and (suspected) exposure to covid-19: Secondary | ICD-10-CM | POA: Diagnosis not present

## 2022-10-14 NOTE — Progress Notes (Unsigned)
Acute Office Visit   Patient: Kristi Clark   DOB: 1950-10-26   72 y.o. Female  MRN: [id number] Visit Date: 10/14/2022  Today's healthcare provider: Oswaldo Conroy , PA-C  Introduced myself to the patient as a Secondary school teacher and provided education on APPs in clinical practice.    Chief Complaint  Patient presents with   Covid Exposure    Patient lives with daughter who the person who tested positive, they slept in the bed with the person. Patient says everyone in the house beside the patient and the kids have not tested. Person who was covid positive started having symptoms Saturday.    Subjective    HPI HPI     Covid Exposure    Additional comments: Patient lives with daughter who the person who tested positive, they slept in the bed with the person. Patient says everyone in the house beside the patient and the kids have not tested. Person who was covid positive started having symptoms Saturday.       Last edited by Malen Gauze, CMA on 10/14/2022  3:33 PM.       COVID exposure  this past Saturday  Patient denies symptoms since she was exposed  Patient's daughter tested positive for COVID  on Wed  They report only one person in home has tested Positive so far; no one else has had symptoms and everyone else has tested negative so far   Medications: Outpatient Medications Prior to Visit  Medication Sig   albuterol (PROVENTIL) (2.5 MG/3ML) 0.083% nebulizer solution Take 3 mLs (2.5 mg total) by nebulization every 4 (four) hours as needed for wheezing or shortness of breath.   albuterol (VENTOLIN HFA) 108 (90 Base) MCG/ACT inhaler Inhale 2 puffs into the lungs every 6 (six) hours as needed for wheezing or shortness of breath.   Aloe-Sodium Chloride (AYR SALINE NASAL GEL NA) Place 1 Application into the nose as needed (dry nares).   amiodarone (PACERONE) 200 MG tablet Take 1 tablet (200 mg total) by mouth daily.   amLODipine (NORVASC) 5 MG tablet Take 1 tablet (5 mg total) by  mouth daily.   aspirin 81 MG tablet Take 81 mg by mouth daily.   atorvastatin (LIPITOR) 40 MG tablet Take 1 tablet (40 mg total) by mouth daily.   Blood Pressure Monitoring (BLOOD PRESSURE MONITOR/WRIST) KIT Take Blood pressure as needed, Dx: I12.9   cetirizine (ZYRTEC) 10 MG tablet Take 10 mg by mouth daily.   clopidogrel (PLAVIX) 75 MG tablet Take 1 tablet (75 mg total) by mouth daily.   diclofenac Sodium (VOLTAREN) 1 % GEL Apply 4 g topically 4 (four) times daily.   feeding supplement (ENSURE ENLIVE / ENSURE PLUS) LIQD Take 237 mLs by mouth 2 (two) times daily between meals.   ferrous sulfate 325 (65 FE) MG tablet Take 1 tablet (325 mg total) by mouth daily with breakfast.   Fluticasone-Umeclidin-Vilant (TRELEGY ELLIPTA) 100-62.5-25 MCG/ACT AEPB 100 mcg DAILY (route: inhalation)   furosemide (LASIX) 20 MG tablet AS needed   hydrALAZINE (APRESOLINE) 25 MG tablet Take 1 tablet (25 mg total) by mouth 2 (two) times daily.   isosorbide mononitrate (IMDUR) 60 MG 24 hr tablet Take 1 tablet (60 mg total) by mouth 2 (two) times daily.   losartan (COZAAR) 25 MG tablet Take 1 tablet (25 mg total) by mouth daily.   metoprolol succinate (TOPROL-XL) 50 MG 24 hr tablet Take 1 tablet (50 mg total) by mouth daily. Take  with or immediately following a meal.   metoprolol tartrate (LOPRESSOR) 50 MG tablet Take by mouth.   mirtazapine (REMERON) 45 MG tablet Take 1 tablet (45 mg total) by mouth at bedtime.   Multiple Vitamin (MULTIVITAMIN WITH MINERALS) TABS tablet Take 1 tablet by mouth daily.   nystatin (MYCOSTATIN) 100000 UNIT/ML suspension Take 5 mLs (500,000 Units total) by mouth 4 (four) times daily.   pantoprazole (PROTONIX) 40 MG tablet Take 1 tablet (40 mg total) by mouth 2 (two) times daily.   polyethylene glycol powder (GLYCOLAX/MIRALAX) 17 GM/SCOOP powder Take 17 g by mouth 3 (three) times daily.   QUEtiapine (SEROQUEL XR) 50 MG TB24 24 hr tablet Take 2 tablets (100 mg total) by mouth at bedtime.    No facility-administered medications prior to visit.    Review of Systems  Constitutional:  Negative for chills, diaphoresis, fatigue and fever.  HENT:  Positive for sneezing. Negative for congestion, ear pain, postnasal drip, rhinorrhea, sinus pressure, sinus pain, sore throat and voice change.   Respiratory:  Positive for shortness of breath (nothing past her normal).   Gastrointestinal:  Negative for diarrhea, nausea and vomiting.  Musculoskeletal:  Negative for myalgias.  Neurological:  Negative for dizziness and headaches.      {See past labs  Heme  Chem  Endocrine  Serology  Results Review (optional):1}   Objective    BP (!) 136/48   Pulse 60   Ht 4\' 11"  (1.499 m)   Wt 107 lb 12.8 oz (48.9 kg)   LMP  (LMP Unknown)   SpO2 95%   BMI 21.77 kg/m   {See vitals history (optional):1}   Physical Exam Vitals reviewed.  Constitutional:      General: She is awake.     Appearance: Normal appearance. She is well-developed and well-groomed.  HENT:     Head: Normocephalic and atraumatic.  Cardiovascular:     Rate and Rhythm: Normal rate and regular rhythm.     Pulses:          Radial pulses are 2+ on the right side and 2+ on the left side.     Heart sounds: Normal heart sounds. No murmur heard.    No friction rub. No gallop.  Pulmonary:     Effort: Pulmonary effort is normal.     Breath sounds: Decreased air movement present. Decreased breath sounds present. No wheezing, rhonchi or rales.  Musculoskeletal:     Right lower leg: No edema.     Left lower leg: No edema.  Neurological:     Mental Status: She is alert.  Psychiatric:        Attention and Perception: Attention and perception normal.        Mood and Affect: Mood and affect normal.        Speech: Speech normal.        Behavior: Behavior normal. Behavior is cooperative.       No results found for any visits on 10/14/22.  Assessment & Plan      No follow-ups on file.     Problem List Items  Addressed This Visit   None Visit Diagnoses     Close exposure to COVID-19 virus    -  Primary        No follow-ups on file.   I,  E , PA-C, have reviewed all documentation for this visit. The documentation on 10/14/22 for the exam, diagnosis, procedures, and orders are all accurate and complete.    , MHS,  IT consultant [facility]

## 2022-10-17 NOTE — Progress Notes (Signed)
Negative for COVID

## 2022-11-02 ENCOUNTER — Ambulatory Visit: Payer: Self-pay | Admitting: *Deleted

## 2022-11-02 NOTE — Patient Outreach (Signed)
  Care Coordination   Follow Up Visit Note   11/02/2022 Name: Kristi Clark MRN: [id number] DOB: [birth year]  Kristi Clark is a 72 y.o. year old female who sees Dorcas Carrow, DO for primary care. I spoke with  Annabell Sabal by phone today.  What matters to the patients health and wellness today?  Recently exposed to Covid, but she has been negative.  Working to stay healthy    Goals Addressed             This Visit's Progress    Effective management of heart conditions   On track    Care Coordination Interventions: Counseled on increased risk of stroke due to Afib and benefits of anticoagulation for stroke prevention Counseled on bleeding risk associated with Plavix and aspirin and importance of self-monitoring for signs/symptoms of bleeding Counseled on avoidance of NSAIDs due to increased bleeding risk with anticoagulants Provided education on low sodium diet Advised patient to weigh each morning after emptying bladder Discussed importance of daily weight and advised patient to weigh and record daily Evaluation of current treatment plan related to hypertension self management and patient's adherence to plan as established by provider Reviewed medications with patient and discussed importance of compliance Discussed plans with patient for ongoing care management follow up and provided patient with direct contact information for care management team          SDOH assessments and interventions completed:  No     Care Coordination Interventions:  Yes, provided   Interventions Today    Flowsheet Row Most Recent Value  Chronic Disease   Chronic disease during today's visit Congestive Heart Failure (CHF), Hypertension (HTN), Atrial Fibrillation (AFib)  General Interventions   General Interventions Discussed/Reviewed General Interventions Reviewed, Doctor Visits, Durable Medical Equipment (DME)  Doctor Visits Discussed/Reviewed Doctor Visits Reviewed, PCP,  Specialist  [facility] 9/19, PCP 9/20, Cardiology 9/24]  Durable Medical Equipment (DME) Val Riles she has received rollator]  PCP/Specialist Visits Compliance with follow-up visit  Exercise Interventions   Exercise Discussed/Reviewed Weight Managment  Weight Management Weight maintenance  Education Interventions   Education Provided Provided Education  Provided Verbal Education On Nutrition, Medication, When to see the doctor  [Report weight and blood pressure are "good"]       Follow up plan: Follow up call scheduled for 10/11    Encounter Outcome:  Pt. Visit Completed   Kemper Durie, RN, MSN, [facility] Care Management Care Management Coordinator [phone]

## 2022-11-09 ENCOUNTER — Ambulatory Visit: Payer: Medicare Other | Admitting: Pediatrics

## 2022-11-10 ENCOUNTER — Encounter: Payer: Self-pay | Admitting: Physician Assistant

## 2022-11-10 ENCOUNTER — Ambulatory Visit (INDEPENDENT_AMBULATORY_CARE_PROVIDER_SITE_OTHER): Payer: Medicare Other | Admitting: Physician Assistant

## 2022-11-10 VITALS — BP 187/53 | HR 60 | Ht 59.0 in | Wt 113.6 lb

## 2022-11-10 DIAGNOSIS — J029 Acute pharyngitis, unspecified: Secondary | ICD-10-CM | POA: Diagnosis not present

## 2022-11-10 DIAGNOSIS — J310 Chronic rhinitis: Secondary | ICD-10-CM

## 2022-11-10 DIAGNOSIS — J069 Acute upper respiratory infection, unspecified: Secondary | ICD-10-CM | POA: Diagnosis not present

## 2022-11-10 MED ORDER — FLUTICASONE PROPIONATE 50 MCG/ACT NA SUSP
2.0000 | Freq: Every day | NASAL | 6 refills | Status: DC
Start: 2022-11-10 — End: 2023-03-09

## 2022-11-10 NOTE — Patient Instructions (Signed)
I also recommend adding an antihistamine to your daily regimen This includes medications like Claritin, Allegra, Zyrtec- the generics of these work very well and are usually less expensive I recommend using Flonase nasal spray - 2 puffs twice per day to help with your nasal congestion The antihistamines and Flonase can take a few weeks to provide significant relief from allergy symptoms but should start to provide some benefit soon.   Make sure you are using the saline nasal spray to help keep the nostrils hydrated. This is especially important while using Oxygen as this can cause a lot of dryness.

## 2022-11-10 NOTE — Progress Notes (Signed)
Acute Office Visit   Patient: Kristi Clark   DOB: [birth year]   72 y.o. Female  MRN: [id number] Visit Date: 11/10/2022  Today's healthcare provider: Oswaldo Conroy Synethia Endicott, PA-C  Introduced myself to the patient as a Secondary school teacher and provided education on APPs in clinical practice.    Chief Complaint  Patient presents with   Sore Throat    Patient says she has exposure to her grandchildren who one has Pneumonia and others have ear infections. Patient says she has been symptomatic for the past 2-3 days. Patient says she feels stuffy and says her throat gets dry at night and hurt really bad to swallow. Patient says when she keeps her mouth moist, it does not hurt as bad. Patient daughter says they swabbed for COVID and was negative.    Subjective    HPI HPI     Sore Throat    Additional comments: Patient says she has exposure to her grandchildren who one has Pneumonia and others have ear infections. Patient says she has been symptomatic for the past 2-3 days. Patient says she feels stuffy and says her throat gets dry at night and hurt really bad to swallow. Patient says when she keeps her mouth moist, it does not hurt as bad. Patient daughter says they swabbed for COVID and was negative.       Last edited by Malen Gauze, CMA on 11/10/2022  9:09 AM.       Sore throat  She states for the last 2-3 days she has had some sore throat and difficulty swallowing States swallowing is more prevalent at night- she denies choking but states it is painful  She reports some sinus drainage  She denies increased trouble breathing    Recent sick contacts: her granddaughter has pneumonia, someone in home has an ear infection and another has a cold  She has not been tested for COVID  Her family was tested for COVID at walk-in on Monday and they were negative for COVID and flu   She has not taken her medications this AM - she has not eaten breakfast yet   Medications: Outpatient Medications Prior  to Visit  Medication Sig   albuterol (PROVENTIL) (2.5 MG/3ML) 0.083% nebulizer solution Take 3 mLs (2.5 mg total) by nebulization every 4 (four) hours as needed for wheezing or shortness of breath.   albuterol (VENTOLIN HFA) 108 (90 Base) MCG/ACT inhaler Inhale 2 puffs into the lungs every 6 (six) hours as needed for wheezing or shortness of breath.   Aloe-Sodium Chloride (AYR SALINE NASAL GEL NA) Place 1 Application into the nose as needed (dry nares).   amiodarone (PACERONE) 200 MG tablet Take 1 tablet (200 mg total) by mouth daily.   amLODipine (NORVASC) 5 MG tablet Take 1 tablet (5 mg total) by mouth daily.   aspirin 81 MG tablet Take 81 mg by mouth daily.   atorvastatin (LIPITOR) 40 MG tablet Take 1 tablet (40 mg total) by mouth daily.   Blood Pressure Monitoring (BLOOD PRESSURE MONITOR/WRIST) KIT Take Blood pressure as needed, Dx: I12.9   cetirizine (ZYRTEC) 10 MG tablet Take 10 mg by mouth daily.   clopidogrel (PLAVIX) 75 MG tablet Take 1 tablet (75 mg total) by mouth daily.   diclofenac Sodium (VOLTAREN) 1 % GEL Apply 4 g topically 4 (four) times daily.   feeding supplement (ENSURE ENLIVE / ENSURE PLUS) LIQD Take 237 mLs by mouth 2 (two) times daily between meals.  ferrous sulfate 325 (65 FE) MG tablet Take 1 tablet (325 mg total) by mouth daily with breakfast.   Fluticasone-Umeclidin-Vilant (TRELEGY ELLIPTA) 100-62.5-25 MCG/ACT AEPB 100 mcg DAILY (route: inhalation)   furosemide (LASIX) 20 MG tablet AS needed   hydrALAZINE (APRESOLINE) 25 MG tablet Take 1 tablet (25 mg total) by mouth 2 (two) times daily.   isosorbide mononitrate (IMDUR) 60 MG 24 hr tablet Take 1 tablet (60 mg total) by mouth 2 (two) times daily.   losartan (COZAAR) 25 MG tablet Take 1 tablet (25 mg total) by mouth daily.   metoprolol succinate (TOPROL-XL) 50 MG 24 hr tablet Take 1 tablet (50 mg total) by mouth daily. Take with or immediately following a meal.   metoprolol tartrate (LOPRESSOR) 50 MG tablet Take by  mouth.   mirtazapine (REMERON) 45 MG tablet Take 1 tablet (45 mg total) by mouth at bedtime.   Multiple Vitamin (MULTIVITAMIN WITH MINERALS) TABS tablet Take 1 tablet by mouth daily.   nystatin (MYCOSTATIN) 100000 UNIT/ML suspension Take 5 mLs (500,000 Units total) by mouth 4 (four) times daily.   pantoprazole (PROTONIX) 40 MG tablet Take 1 tablet (40 mg total) by mouth 2 (two) times daily.   polyethylene glycol powder (GLYCOLAX/MIRALAX) 17 GM/SCOOP powder Take 17 g by mouth 3 (three) times daily.   QUEtiapine (SEROQUEL XR) 50 MG TB24 24 hr tablet Take 2 tablets (100 mg total) by mouth at bedtime.   No facility-administered medications prior to visit.    Review of Systems  Constitutional:  Positive for fatigue. Negative for chills and fever.  HENT:  Positive for congestion, postnasal drip, sore throat, tinnitus (chronic) and trouble swallowing. Negative for ear pain, sinus pressure and sinus pain.   Respiratory:  Negative for cough.   Gastrointestinal:  Negative for diarrhea, nausea and vomiting.  Neurological:  Positive for headaches.        Objective    BP (!) 187/53 (BP Location: Right Arm, Cuff Size: Normal)   Pulse 60   Ht 4\' 11"  (1.499 m)   Wt 113 lb 9.6 oz (51.5 kg)   LMP  (LMP Unknown)   SpO2 94%   BMI 22.94 kg/m     Physical Exam Vitals reviewed.  Constitutional:      General: She is awake.     Appearance: Normal appearance. She is well-developed and well-groomed.  HENT:     Head: Normocephalic and atraumatic.     Nose: Rhinorrhea present. No nasal deformity, septal deviation, signs of injury or nasal tenderness. Rhinorrhea is bloody.     Right Turbinates: Enlarged.     Left Turbinates: Enlarged.     Mouth/Throat:     Lips: Pink.     Mouth: Mucous membranes are moist.     Pharynx: Uvula midline. No pharyngeal swelling, oropharyngeal exudate, posterior oropharyngeal erythema, uvula swelling or postnasal drip.     Tonsils: No tonsillar exudate or tonsillar  abscesses.  Eyes:     General: Lids are normal. Gaze aligned appropriately.  Cardiovascular:     Rate and Rhythm: Normal rate and regular rhythm.     Heart sounds: Normal heart sounds. No murmur heard.    No friction rub. No gallop.  Pulmonary:     Effort: Pulmonary effort is normal.     Breath sounds: Normal breath sounds. No decreased air movement. No decreased breath sounds, wheezing, rhonchi or rales.  Neurological:     Mental Status: She is alert.  Psychiatric:        Behavior:  Behavior is cooperative.       Results for orders placed or performed in visit on 11/10/22  Rapid Strep screen([facility])   Specimen: Other   Other  Result Value Ref Range   Strep Gp A Ag, IA W/Reflex Negative Negative  Culture, Group A Strep   Other  Result Value Ref Range   Strep A Culture WILL FOLLOW     Assessment & Plan      No follow-ups on file.      Problem List Items Addressed This Visit   None Visit Diagnoses     Chronic rhinitis    -  Primary Chronic, ongoing Suspect this is secondary to continuous oxygen use and allergies Recommend she uses saline nasal spray, Flonase and adds daily second generation antihistamine of choice to regimen to assist with management  Follow up as needed for persistent or progressing symptoms     Relevant Medications   fluticasone (FLONASE) 50 MCG/ACT nasal spray   Sore throat     Acute, new concern Patient reports sore throat and some pain with swallowing Suspect this is likely secondary to rhinitis and use of continuous O2 Rapid strep was negative- results reviewed with patient during apt, reflex culture sent off, results to dictate further management Will also test for COVID for rule out Follow up as needed for persistent or progressing symptoms     Relevant Orders   Rapid Strep screen([facility]) (Completed)   Novel Coronavirus, NAA ([facility])        No follow-ups on file.   I, Yasaman Kolek E Tavis Kring, PA-C, have reviewed all  documentation for this visit. The documentation on 11/10/22 for the exam, diagnosis, procedures, and orders are all accurate and complete.   Jacquelin Hawking, MHS, PA-C [facility]

## 2022-11-11 LAB — NOVEL CORONAVIRUS, NAA: SARS-CoV-2, NAA: NOT DETECTED

## 2022-11-13 LAB — RAPID STREP SCREEN (MED CTR MEBANE ONLY): Strep Gp A Ag, IA W/Reflex: NEGATIVE

## 2022-11-13 LAB — CULTURE, GROUP A STREP: Strep A Culture: NEGATIVE

## 2022-11-16 NOTE — Progress Notes (Signed)
COVID and strep testing were negative

## 2022-11-24 DIAGNOSIS — R0609 Other forms of dyspnea: Secondary | ICD-10-CM | POA: Diagnosis not present

## 2022-11-24 DIAGNOSIS — J449 Chronic obstructive pulmonary disease, unspecified: Secondary | ICD-10-CM | POA: Diagnosis not present

## 2022-11-24 DIAGNOSIS — Z9981 Dependence on supplemental oxygen: Secondary | ICD-10-CM | POA: Diagnosis not present

## 2022-11-25 ENCOUNTER — Encounter: Payer: Self-pay | Admitting: Family Medicine

## 2022-11-25 ENCOUNTER — Ambulatory Visit (INDEPENDENT_AMBULATORY_CARE_PROVIDER_SITE_OTHER): Payer: Medicare Other | Admitting: Family Medicine

## 2022-11-25 VITALS — BP 155/53 | HR 62 | Ht 59.0 in | Wt 114.4 lb

## 2022-11-25 DIAGNOSIS — J449 Chronic obstructive pulmonary disease, unspecified: Secondary | ICD-10-CM

## 2022-11-25 DIAGNOSIS — Z23 Encounter for immunization: Secondary | ICD-10-CM

## 2022-11-25 DIAGNOSIS — E782 Mixed hyperlipidemia: Secondary | ICD-10-CM | POA: Diagnosis not present

## 2022-11-25 DIAGNOSIS — I10 Essential (primary) hypertension: Secondary | ICD-10-CM | POA: Diagnosis not present

## 2022-11-25 DIAGNOSIS — F339 Major depressive disorder, recurrent, unspecified: Secondary | ICD-10-CM

## 2022-11-25 NOTE — Progress Notes (Unsigned)
BP (!) 160/55   Pulse 65   Ht 4\' 11"  (1.499 m)   Wt 114 lb 6.4 oz (51.9 kg)   LMP  (LMP Unknown)   SpO2 (!) 89%   BMI 23.11 kg/m    Subjective:    Patient ID: Kristi Clark, female    DOB: 1950-07-13, 72 y.o.   MRN: [id number]  HPI: MARRISSA TEZENO is a 72 y.o. female  Chief Complaint  Patient presents with   Hypertension   Hyperlipidemia   Depression   COPD   DEPRESSION Mood status: {Blank single:19197::"controlled","uncontrolled","better","worse","exacerbated","stable"} Satisfied with current treatment?: {Blank single:19197::"yes","no"} Symptom severity: {Blank single:19197::"mild","moderate","severe"}  Duration of current treatment : {Blank single:19197::"chronic","months","years"} Side effects: {Blank single:19197::"yes","no"} Medication compliance: {Blank single:19197::"excellent compliance","good compliance","fair compliance","poor compliance"} Psychotherapy/counseling: {Blank single:19197::"yes","no"} {Blank single:19197::"current","in the past"} Previous psychiatric medications: {Blank multiple:19196::"abilify","amitryptiline","buspar","celexa","cymbalta","depakote","effexor","lamictal","lexapro","lithium","nortryptiline","paxil","prozac","pristiq (desvenlafaxine","seroquel","wellbutrin","zoloft","zyprexa"} Depressed mood: {Blank single:19197::"yes","no"} Anxious mood: {Blank single:19197::"yes","no"} Anhedonia: {Blank single:19197::"yes","no"} Significant weight loss or gain: {Blank single:19197::"yes","no"} Insomnia: {Blank single:19197::"yes","no"} {Blank single:19197::"hard to fall asleep","hard to stay asleep"} Fatigue: {Blank single:19197::"yes","no"} Feelings of worthlessness or guilt: {Blank single:19197::"yes","no"} Impaired concentration/indecisiveness: {Blank single:19197::"yes","no"} Suicidal ideations: {Blank single:19197::"yes","no"} Hopelessness: {Blank single:19197::"yes","no"} Crying spells: {Blank single:19197::"yes","no"}    11/10/2022     9:13 AM 10/14/2022    3:37 PM 08/25/2022    9:48 AM 06/13/2022    9:34 AM 05/25/2022    8:43 AM  Depression screen PHQ 2/9  Decreased Interest 1 0 1 0 1  Down, Depressed, Hopeless 0 0 1 1 2   PHQ - 2 Score 1 0 2 1 3   Altered sleeping 0 0 1 1 0  Tired, decreased energy 1 0 1 1 1   Change in appetite 0 0 0 1 0  Feeling bad or failure about yourself  0 0 1 0 1  Trouble concentrating 0 0 0 0 0  Moving slowly or fidgety/restless 0 0 0 1 0  Suicidal thoughts 0 0 0 0 0  PHQ-9 Score 2 0 5 5 5   Difficult doing work/chores Not difficult at all Not difficult at all Somewhat difficult Not difficult at all Somewhat difficult     Relevant past medical, surgical, family and social history reviewed and updated as indicated. Interim medical history since our last visit reviewed. Allergies and medications reviewed and updated.  Review of Systems  Per HPI unless specifically indicated above     Objective:    BP (!) 160/55   Pulse 65   Ht 4\' 11"  (1.499 m)   Wt 114 lb 6.4 oz (51.9 kg)   LMP  (LMP Unknown)   SpO2 (!) 89%   BMI 23.11 kg/m   Wt Readings from Last 3 Encounters:  11/25/22 114 lb 6.4 oz (51.9 kg)  11/10/22 113 lb 9.6 oz (51.5 kg)  10/14/22 107 lb 12.8 oz (48.9 kg)    Physical Exam  Results for orders placed or performed in visit on 11/10/22  Rapid Strep screen([facility])   Specimen: Other   Other  Result Value Ref Range   Strep Gp A Ag, IA W/Reflex Negative Negative  Novel Coronavirus, NAA ([facility])   Specimen: Nasopharyngeal(NP) swabs in vial transport medium  Result Value Ref Range   SARS-CoV-2, NAA Not Detected Not Detected  Culture, Group A Strep   Other  Result Value Ref Range   Strep A Culture Negative       Assessment & Plan:   Problem List Items Addressed This Visit   None    Follow up plan: No follow-ups on file.

## 2022-11-29 ENCOUNTER — Other Ambulatory Visit: Payer: Self-pay

## 2022-11-29 ENCOUNTER — Ambulatory Visit: Payer: Medicare Other | Admitting: Cardiovascular Disease

## 2022-11-29 MED ORDER — AMLODIPINE BESYLATE 5 MG PO TABS: 5.0000 mg | ORAL_TABLET | Freq: Every day | ORAL | 5 refills | Status: DC

## 2022-11-29 MED ORDER — AMLODIPINE BESYLATE 5 MG PO TABS: 5.0000 mg | ORAL_TABLET | Freq: Every day | ORAL | 0 refills | Status: DC

## 2022-11-30 ENCOUNTER — Inpatient Hospital Stay
Admission: EM | Admit: 2022-11-30 | Discharge: 2022-12-09 | DRG: 871 | Disposition: A | Discharge status: Hospice | Payer: Medicare Other | Attending: Internal Medicine | Admitting: Internal Medicine

## 2022-11-30 ENCOUNTER — Encounter: Payer: Self-pay | Admitting: Family Medicine

## 2022-11-30 ENCOUNTER — Emergency Department: Payer: Medicare Other

## 2022-11-30 ENCOUNTER — Ambulatory Visit: Payer: Self-pay | Admitting: *Deleted

## 2022-11-30 ENCOUNTER — Other Ambulatory Visit: Payer: Self-pay

## 2022-11-30 DIAGNOSIS — Z79899 Other long term (current) drug therapy: Secondary | ICD-10-CM

## 2022-11-30 DIAGNOSIS — J441 Chronic obstructive pulmonary disease with (acute) exacerbation: Secondary | ICD-10-CM | POA: Diagnosis present

## 2022-11-30 DIAGNOSIS — J9611 Chronic respiratory failure with hypoxia: Secondary | ICD-10-CM | POA: Diagnosis not present

## 2022-11-30 DIAGNOSIS — J9621 Acute and chronic respiratory failure with hypoxia: Secondary | ICD-10-CM | POA: Diagnosis present

## 2022-11-30 DIAGNOSIS — Z833 Family history of diabetes mellitus: Secondary | ICD-10-CM

## 2022-11-30 DIAGNOSIS — F419 Anxiety disorder, unspecified: Secondary | ICD-10-CM | POA: Diagnosis present

## 2022-11-30 DIAGNOSIS — E861 Hypovolemia: Secondary | ICD-10-CM | POA: Diagnosis present

## 2022-11-30 DIAGNOSIS — E871 Hypo-osmolality and hyponatremia: Secondary | ICD-10-CM | POA: Diagnosis present

## 2022-11-30 DIAGNOSIS — R7989 Other specified abnormal findings of blood chemistry: Secondary | ICD-10-CM | POA: Insufficient documentation

## 2022-11-30 DIAGNOSIS — E860 Dehydration: Secondary | ICD-10-CM | POA: Diagnosis present

## 2022-11-30 DIAGNOSIS — I480 Paroxysmal atrial fibrillation: Secondary | ICD-10-CM | POA: Diagnosis not present

## 2022-11-30 DIAGNOSIS — Z825 Family history of asthma and other chronic lower respiratory diseases: Secondary | ICD-10-CM

## 2022-11-30 DIAGNOSIS — J9601 Acute respiratory failure with hypoxia: Secondary | ICD-10-CM | POA: Diagnosis not present

## 2022-11-30 DIAGNOSIS — R6521 Severe sepsis with septic shock: Secondary | ICD-10-CM | POA: Diagnosis present

## 2022-11-30 DIAGNOSIS — Z880 Allergy status to penicillin: Secondary | ICD-10-CM

## 2022-11-30 DIAGNOSIS — Z803 Family history of malignant neoplasm of breast: Secondary | ICD-10-CM

## 2022-11-30 DIAGNOSIS — J449 Chronic obstructive pulmonary disease, unspecified: Secondary | ICD-10-CM | POA: Diagnosis present

## 2022-11-30 DIAGNOSIS — J440 Chronic obstructive pulmonary disease with acute lower respiratory infection: Secondary | ICD-10-CM | POA: Diagnosis present

## 2022-11-30 DIAGNOSIS — J90 Pleural effusion, not elsewhere classified: Secondary | ICD-10-CM | POA: Diagnosis not present

## 2022-11-30 DIAGNOSIS — I3139 Other pericardial effusion (noninflammatory): Secondary | ICD-10-CM | POA: Diagnosis not present

## 2022-11-30 DIAGNOSIS — Z9049 Acquired absence of other specified parts of digestive tract: Secondary | ICD-10-CM

## 2022-11-30 DIAGNOSIS — I10 Essential (primary) hypertension: Secondary | ICD-10-CM | POA: Diagnosis not present

## 2022-11-30 DIAGNOSIS — I959 Hypotension, unspecified: Secondary | ICD-10-CM | POA: Diagnosis not present

## 2022-11-30 DIAGNOSIS — Z8249 Family history of ischemic heart disease and other diseases of the circulatory system: Secondary | ICD-10-CM

## 2022-11-30 DIAGNOSIS — I5082 Biventricular heart failure: Secondary | ICD-10-CM | POA: Diagnosis not present

## 2022-11-30 DIAGNOSIS — Z515 Encounter for palliative care: Secondary | ICD-10-CM | POA: Diagnosis not present

## 2022-11-30 DIAGNOSIS — Z555 Less than a high school diploma: Secondary | ICD-10-CM

## 2022-11-30 DIAGNOSIS — R0602 Shortness of breath: Secondary | ICD-10-CM | POA: Diagnosis not present

## 2022-11-30 DIAGNOSIS — D539 Nutritional anemia, unspecified: Secondary | ICD-10-CM | POA: Diagnosis present

## 2022-11-30 DIAGNOSIS — Z882 Allergy status to sulfonamides status: Secondary | ICD-10-CM

## 2022-11-30 DIAGNOSIS — J189 Pneumonia, unspecified organism: Secondary | ICD-10-CM | POA: Diagnosis not present

## 2022-11-30 DIAGNOSIS — Z955 Presence of coronary angioplasty implant and graft: Secondary | ICD-10-CM

## 2022-11-30 DIAGNOSIS — N179 Acute kidney failure, unspecified: Secondary | ICD-10-CM | POA: Diagnosis not present

## 2022-11-30 DIAGNOSIS — Z85828 Personal history of other malignant neoplasm of skin: Secondary | ICD-10-CM | POA: Diagnosis not present

## 2022-11-30 DIAGNOSIS — A419 Sepsis, unspecified organism: Secondary | ICD-10-CM | POA: Diagnosis present

## 2022-11-30 DIAGNOSIS — R0689 Other abnormalities of breathing: Secondary | ICD-10-CM | POA: Diagnosis not present

## 2022-11-30 DIAGNOSIS — K219 Gastro-esophageal reflux disease without esophagitis: Secondary | ICD-10-CM | POA: Diagnosis present

## 2022-11-30 DIAGNOSIS — Z66 Do not resuscitate: Secondary | ICD-10-CM | POA: Diagnosis present

## 2022-11-30 DIAGNOSIS — Z1152 Encounter for screening for COVID-19: Secondary | ICD-10-CM

## 2022-11-30 DIAGNOSIS — R339 Retention of urine, unspecified: Secondary | ICD-10-CM | POA: Diagnosis not present

## 2022-11-30 DIAGNOSIS — J439 Emphysema, unspecified: Secondary | ICD-10-CM | POA: Diagnosis present

## 2022-11-30 DIAGNOSIS — R590 Localized enlarged lymph nodes: Secondary | ICD-10-CM | POA: Diagnosis not present

## 2022-11-30 DIAGNOSIS — I5032 Chronic diastolic (congestive) heart failure: Secondary | ICD-10-CM | POA: Diagnosis not present

## 2022-11-30 DIAGNOSIS — Z823 Family history of stroke: Secondary | ICD-10-CM

## 2022-11-30 DIAGNOSIS — Z8679 Personal history of other diseases of the circulatory system: Secondary | ICD-10-CM

## 2022-11-30 DIAGNOSIS — R918 Other nonspecific abnormal finding of lung field: Secondary | ICD-10-CM | POA: Diagnosis not present

## 2022-11-30 DIAGNOSIS — Z87891 Personal history of nicotine dependence: Secondary | ICD-10-CM

## 2022-11-30 DIAGNOSIS — Z7189 Other specified counseling: Secondary | ICD-10-CM | POA: Diagnosis not present

## 2022-11-30 DIAGNOSIS — Z83438 Family history of other disorder of lipoprotein metabolism and other lipidemia: Secondary | ICD-10-CM

## 2022-11-30 DIAGNOSIS — R748 Abnormal levels of other serum enzymes: Secondary | ICD-10-CM | POA: Diagnosis present

## 2022-11-30 DIAGNOSIS — Z9981 Dependence on supplemental oxygen: Secondary | ICD-10-CM

## 2022-11-30 DIAGNOSIS — I110 Hypertensive heart disease with heart failure: Secondary | ICD-10-CM | POA: Diagnosis present

## 2022-11-30 DIAGNOSIS — Z881 Allergy status to other antibiotic agents status: Secondary | ICD-10-CM

## 2022-11-30 DIAGNOSIS — Z90710 Acquired absence of both cervix and uterus: Secondary | ICD-10-CM

## 2022-11-30 DIAGNOSIS — Z7902 Long term (current) use of antithrombotics/antiplatelets: Secondary | ICD-10-CM

## 2022-11-30 DIAGNOSIS — E785 Hyperlipidemia, unspecified: Secondary | ICD-10-CM | POA: Diagnosis not present

## 2022-11-30 DIAGNOSIS — R069 Unspecified abnormalities of breathing: Secondary | ICD-10-CM | POA: Diagnosis not present

## 2022-11-30 DIAGNOSIS — Z7951 Long term (current) use of inhaled steroids: Secondary | ICD-10-CM

## 2022-11-30 DIAGNOSIS — I2510 Atherosclerotic heart disease of native coronary artery without angina pectoris: Secondary | ICD-10-CM | POA: Diagnosis not present

## 2022-11-30 DIAGNOSIS — Z7982 Long term (current) use of aspirin: Secondary | ICD-10-CM

## 2022-11-30 DIAGNOSIS — Z888 Allergy status to other drugs, medicaments and biological substances status: Secondary | ICD-10-CM

## 2022-11-30 DIAGNOSIS — Z885 Allergy status to narcotic agent status: Secondary | ICD-10-CM

## 2022-11-30 DIAGNOSIS — J432 Centrilobular emphysema: Secondary | ICD-10-CM | POA: Diagnosis not present

## 2022-11-30 LAB — BASIC METABOLIC PANEL
Anion gap: 9 (ref 5–15)
BUN: 21 mg/dL (ref 8–23)
CO2: 26 mmol/L (ref 22–32)
Calcium: 8 mg/dL — ABNORMAL LOW (ref 8.9–10.3)
Chloride: 97 mmol/L — ABNORMAL LOW (ref 98–111)
Creatinine, Ser: 1.02 mg/dL — ABNORMAL HIGH (ref 0.44–1.00)
GFR, Estimated: 58 mL/min — ABNORMAL LOW (ref >60)
Glucose, Bld: 97 mg/dL (ref 70–99)
Potassium: 3.2 mmol/L — ABNORMAL LOW (ref 3.5–5.1)
Sodium: 132 mmol/L — ABNORMAL LOW (ref 135–145)

## 2022-11-30 LAB — CBC
HCT: 27.3 % — ABNORMAL LOW (ref 36.0–46.0)
Hemoglobin: 8.9 g/dL — ABNORMAL LOW (ref 12.0–15.0)
MCH: 32 pg (ref 26.0–34.0)
MCHC: 32.6 g/dL (ref 30.0–36.0)
MCV: 98.2 fL (ref 80.0–100.0)
Platelets: 180 10*3/uL (ref 150–400)
RBC: 2.78 MIL/uL — ABNORMAL LOW (ref 3.87–5.11)
RDW: 13.6 % (ref 11.5–15.5)
WBC: 14.2 10*3/uL — ABNORMAL HIGH (ref 4.0–10.5)
nRBC: 0 % (ref 0.0–0.2)

## 2022-11-30 LAB — HEPATIC FUNCTION PANEL
ALT: 47 U/L — ABNORMAL HIGH (ref 0–44)
AST: 60 U/L — ABNORMAL HIGH (ref 15–41)
Albumin: 2.8 g/dL — ABNORMAL LOW (ref 3.5–5.0)
Alkaline Phosphatase: 76 U/L (ref 38–126)
Bilirubin, Direct: <0.1 mg/dL (ref 0.0–0.2)
Total Bilirubin: 0.6 mg/dL (ref 0.3–1.2)
Total Protein: 6.1 g/dL — ABNORMAL LOW (ref 6.5–8.1)

## 2022-11-30 LAB — LACTIC ACID, PLASMA
Lactic Acid, Venous: 3.9 mmol/L (ref 0.5–1.9)
Lactic Acid, Venous: 4.5 mmol/L (ref 0.5–1.9)

## 2022-11-30 LAB — TROPONIN I (HIGH SENSITIVITY)
Troponin I (High Sensitivity): 14 ng/L (ref <18)
Troponin I (High Sensitivity): 18 ng/L — ABNORMAL HIGH (ref <18)

## 2022-11-30 LAB — PROCALCITONIN: Procalcitonin: 2.63 ng/mL

## 2022-11-30 LAB — SARS CORONAVIRUS 2 BY RT PCR: SARS Coronavirus 2 by RT PCR: NEGATIVE

## 2022-11-30 LAB — BRAIN NATRIURETIC PEPTIDE: B Natriuretic Peptide: 473.9 pg/mL — ABNORMAL HIGH (ref 0.0–100.0)

## 2022-11-30 MED ORDER — LACTATED RINGERS IV BOLUS
500.0000 mL | Freq: Once | INTRAVENOUS | Status: AC
  Administered 2022-11-30: 500 mL via INTRAVENOUS

## 2022-11-30 MED ORDER — METHYLPREDNISOLONE SODIUM SUCC 125 MG IJ SOLR
125.0000 mg | Freq: Once | INTRAMUSCULAR | Status: AC
  Administered 2022-11-30: 125 mg via INTRAVENOUS
  Filled 2022-11-30: qty 2

## 2022-11-30 MED ORDER — AMIODARONE HCL 200 MG PO TABS
200.0000 mg | ORAL_TABLET | Freq: Every day | ORAL | Status: DC
  Administered 2022-12-01 – 2022-12-09 (×9): 200 mg via ORAL
  Filled 2022-11-30 (×9): qty 1

## 2022-11-30 MED ORDER — MIRTAZAPINE 15 MG PO TABS
45.0000 mg | ORAL_TABLET | Freq: Every day | ORAL | Status: DC
  Administered 2022-11-30 – 2022-12-08 (×9): 45 mg via ORAL
  Filled 2022-11-30 (×9): qty 3

## 2022-11-30 MED ORDER — SODIUM CHLORIDE 0.9 % IV SOLN
2.0000 g | Freq: Once | INTRAVENOUS | Status: AC
  Administered 2022-11-30: 2 g via INTRAVENOUS
  Filled 2022-11-30: qty 20

## 2022-11-30 MED ORDER — IPRATROPIUM-ALBUTEROL 0.5-2.5 (3) MG/3ML IN SOLN
3.0000 mL | Freq: Once | RESPIRATORY_TRACT | Status: AC
  Administered 2022-11-30: 3 mL via RESPIRATORY_TRACT
  Filled 2022-11-30: qty 3

## 2022-11-30 MED ORDER — HYDRALAZINE HCL 25 MG PO TABS
25.0000 mg | ORAL_TABLET | Freq: Two times a day (BID) | ORAL | Status: DC
  Administered 2022-11-30 – 2022-12-09 (×18): 25 mg via ORAL
  Filled 2022-11-30 (×18): qty 1

## 2022-11-30 MED ORDER — ENOXAPARIN SODIUM 30 MG/0.3ML IJ SOSY: 30.0000 mg | PREFILLED_SYRINGE | INTRAMUSCULAR | Status: DC

## 2022-11-30 MED ORDER — SODIUM CHLORIDE 0.9 % IV SOLN
2.0000 g | INTRAVENOUS | Status: DC
  Administered 2022-12-01 – 2022-12-03 (×3): 2 g via INTRAVENOUS
  Filled 2022-11-30 (×4): qty 20

## 2022-11-30 MED ORDER — PANTOPRAZOLE SODIUM 40 MG PO TBEC
40.0000 mg | DELAYED_RELEASE_TABLET | Freq: Two times a day (BID) | ORAL | Status: DC
  Administered 2022-11-30 – 2022-12-09 (×18): 40 mg via ORAL
  Filled 2022-11-30 (×18): qty 1

## 2022-11-30 MED ORDER — SODIUM CHLORIDE 0.9 % IV BOLUS
500.0000 mL | Freq: Once | INTRAVENOUS | Status: AC
  Administered 2022-11-30: 500 mL via INTRAVENOUS

## 2022-11-30 MED ORDER — METRONIDAZOLE 500 MG/100ML IV SOLN
500.0000 mg | Freq: Two times a day (BID) | INTRAVENOUS | Status: DC
  Filled 2022-11-30: qty 100

## 2022-11-30 MED ORDER — QUETIAPINE FUMARATE ER 50 MG PO TB24
100.0000 mg | ORAL_TABLET | Freq: Every day | ORAL | Status: DC
  Administered 2022-11-30 – 2022-12-08 (×9): 100 mg via ORAL
  Filled 2022-11-30 (×9): qty 2

## 2022-11-30 MED ORDER — POLYETHYLENE GLYCOL 3350 17 GM/SCOOP PO POWD: 17.0000 g | Freq: Three times a day (TID) | ORAL | Status: DC | PRN

## 2022-11-30 MED ORDER — ASPIRIN 81 MG PO TABS: 81.0000 mg | ORAL_TABLET | Freq: Every day | ORAL | Status: DC

## 2022-11-30 MED ORDER — ISOSORBIDE MONONITRATE ER 30 MG PO TB24
60.0000 mg | ORAL_TABLET | Freq: Every day | ORAL | Status: DC
  Administered 2022-12-01 – 2022-12-09 (×9): 60 mg via ORAL
  Filled 2022-11-30 (×9): qty 2

## 2022-11-30 MED ORDER — FERROUS SULFATE 325 (65 FE) MG PO TABS
325.0000 mg | ORAL_TABLET | Freq: Every day | ORAL | Status: DC
  Administered 2022-12-01 – 2022-12-09 (×9): 325 mg via ORAL
  Filled 2022-11-30 (×9): qty 1

## 2022-11-30 MED ORDER — METOPROLOL SUCCINATE ER 50 MG PO TB24
50.0000 mg | ORAL_TABLET | Freq: Every day | ORAL | Status: DC
  Administered 2022-12-01 – 2022-12-09 (×9): 50 mg via ORAL
  Filled 2022-11-30 (×9): qty 1

## 2022-11-30 MED ORDER — UMECLIDINIUM BROMIDE 62.5 MCG/ACT IN AEPB
1.0000 | INHALATION_SPRAY | Freq: Every day | RESPIRATORY_TRACT | Status: DC
Start: 2022-12-01 — End: 2022-11-30

## 2022-11-30 MED ORDER — ENOXAPARIN SODIUM 40 MG/0.4ML IJ SOSY
40.0000 mg | PREFILLED_SYRINGE | INTRAMUSCULAR | Status: DC
  Administered 2022-11-30 – 2022-12-08 (×9): 40 mg via SUBCUTANEOUS
  Filled 2022-11-30 (×9): qty 0.4

## 2022-11-30 MED ORDER — SODIUM CHLORIDE 0.9 % IV SOLN
500.0000 mg | Freq: Once | INTRAVENOUS | Status: AC
  Administered 2022-11-30: 500 mg via INTRAVENOUS
  Filled 2022-11-30: qty 5

## 2022-11-30 MED ORDER — FLUTICASONE FUROATE-VILANTEROL 100-25 MCG/ACT IN AEPB
1.0000 | INHALATION_SPRAY | Freq: Every day | RESPIRATORY_TRACT | Status: DC
Start: 2022-11-30 — End: 2022-11-30

## 2022-11-30 MED ORDER — LACTATED RINGERS IV SOLN: INTRAVENOUS | Status: AC

## 2022-11-30 MED ORDER — ACETAMINOPHEN 500 MG PO TABS
1000.0000 mg | ORAL_TABLET | Freq: Once | ORAL | Status: AC
  Administered 2022-11-30: 1000 mg via ORAL
  Filled 2022-11-30: qty 2

## 2022-11-30 MED ORDER — BUDESONIDE 0.25 MG/2ML IN SUSP
0.2500 mg | Freq: Two times a day (BID) | RESPIRATORY_TRACT | Status: DC
  Administered 2022-11-30 – 2022-12-08 (×16): 0.25 mg via RESPIRATORY_TRACT
  Filled 2022-11-30 (×13): qty 2

## 2022-11-30 MED ORDER — PREDNISONE 20 MG PO TABS
40.0000 mg | ORAL_TABLET | Freq: Every day | ORAL | Status: DC
  Administered 2022-12-01: 40 mg via ORAL
  Filled 2022-11-30: qty 2

## 2022-11-30 MED ORDER — IPRATROPIUM-ALBUTEROL 0.5-2.5 (3) MG/3ML IN SOLN
3.0000 mL | Freq: Four times a day (QID) | RESPIRATORY_TRACT | Status: DC
  Administered 2022-11-30: 3 mL via RESPIRATORY_TRACT
  Filled 2022-11-30: qty 3

## 2022-11-30 MED ORDER — ACETAMINOPHEN 325 MG PO TABS
650.0000 mg | ORAL_TABLET | Freq: Four times a day (QID) | ORAL | Status: DC | PRN
  Administered 2022-11-30 – 2022-12-06 (×7): 650 mg via ORAL
  Filled 2022-11-30 (×7): qty 2

## 2022-11-30 MED ORDER — ASPIRIN 81 MG PO TBEC
81.0000 mg | DELAYED_RELEASE_TABLET | Freq: Every day | ORAL | Status: DC
  Administered 2022-11-30 – 2022-12-09 (×10): 81 mg via ORAL
  Filled 2022-11-30 (×10): qty 1

## 2022-11-30 MED ORDER — SODIUM CHLORIDE 0.9 % IV SOLN
500.0000 mg | INTRAVENOUS | Status: AC
  Administered 2022-12-01 – 2022-12-03 (×3): 500 mg via INTRAVENOUS
  Filled 2022-11-30 (×4): qty 5

## 2022-11-30 MED ORDER — CLOPIDOGREL BISULFATE 75 MG PO TABS
75.0000 mg | ORAL_TABLET | Freq: Every day | ORAL | Status: DC
  Administered 2022-12-01 – 2022-12-09 (×9): 75 mg via ORAL
  Filled 2022-11-30 (×9): qty 1

## 2022-11-30 MED ORDER — ATORVASTATIN CALCIUM 20 MG PO TABS
40.0000 mg | ORAL_TABLET | Freq: Every day | ORAL | Status: DC
  Administered 2022-12-01 – 2022-12-08 (×8): 40 mg via ORAL
  Filled 2022-11-30 (×8): qty 2

## 2022-11-30 NOTE — Assessment & Plan Note (Signed)
I suspect this is due to poor p.o. intake in the setting of recurrent fevers.  - IV fluids as ordered - Avoid nephrotoxic agents - Repeat BMP in the a.m.

## 2022-11-30 NOTE — H&P (Addendum)
History and Physical    Patient: Kristi Clark XLK:[id number] DOB: May 12, 1950 DOA: 11/30/2022 DOS: the patient was seen and examined on 11/30/2022 PCP: Dorcas Carrow, DO  Patient coming from: Home  Chief Complaint:  Chief Complaint  Patient presents with   Shortness of Breath   HPI: Kristi Clark is a 72 y.o. female with medical history significant of end-stage COPD, chronic hypoxic respiratory failure on 3 L, atrial fibrillation not on AC, CAD, HFpEF, hypertension, hyperlipidemia, who presents to the ED due to shortness of breath.  Kristi Clark states that for the last couple days, Kristi Clark feels that Kristi Clark has been having a fever, especially at night.  Kristi Clark has been having nighttime sweats.  Then yesterday, Kristi Clark had rapidly onset generalized malaise.  Yesterday into the evening, Kristi Clark developed a productive cough and quickly worsening shortness of breath.  Kristi Clark denies any chest pain or palpitations, nausea vomiting, abdominal pain or urinary symptoms.  No lower extremity edema.  Kristi Clark notes that Kristi Clark feels very dehydrated.  ED course: On arrival to the ED, patient's blood pressure was 130/35 with heart rate of 82.  Kristi Clark was saturating at 91% on 3 L.  Kristi Clark was afebrile at 99.9.  Initial workup notable for WBC of 14.2, hemoglobin of 8.9, potassium 3.2, BUN 21, creatinine 1.02 with GFR of 58, AST 60, ALT 47.  Initial troponin 14 with increased to 18.  BNP 473.  Procalcitonin elevated 2.6.  Chest x-ray was obtained that demonstrated a right upper lobe opacity in addition to a pleural effusion.  TRH contacted for admission.   Review of Systems: As mentioned in the history of present illness. All other systems reviewed and are negative.  Past Medical History:  Diagnosis Date   AK (actinic keratosis) 10/25/2017   left forehead   Arrhythmia    atrial fibrillation   BCC (basal cell carcinoma) 10/25/2017   left upper lateral eyelid, Moh's   CHF (congestive heart failure) (HCC)    COPD (chronic  obstructive pulmonary disease) (HCC)    Coronary artery disease    Depression    Dysrhythmia    GERD (gastroesophageal reflux disease)    Hyperlipidemia    Hypertension    Menopause    Osteopenia    Severe sepsis (HCC) 07/14/2014   Tobacco abuse    Past Surgical History:  Procedure Laterality Date   CARDIAC CATHETERIZATION     CHOLECYSTECTOMY  2014   COLONOSCOPY WITH PROPOFOL N/A 01/16/2018   Procedure: COLONOSCOPY WITH PROPOFOL;  Surgeon: Christena Deem, MD;  Location: [facility] ENDOSCOPY;  Service: Endoscopy;  Laterality: N/A;   CORONARY ANGIOPLASTY     ESOPHAGOGASTRODUODENOSCOPY (EGD) WITH PROPOFOL N/A 01/16/2018   Procedure: ESOPHAGOGASTRODUODENOSCOPY (EGD) WITH PROPOFOL;  Surgeon: Christena Deem, MD;  Location: [facility] ENDOSCOPY;  Service: Endoscopy;  Laterality: N/A;   heart stent     LEFT HEART CATH AND CORONARY ANGIOGRAPHY N/A 09/17/2021   Procedure: LEFT HEART CATH AND CORONARY ANGIOGRAPHY;  Surgeon: Laurier Nancy, MD;  Location: [facility] INVASIVE CV LAB;  Service: Cardiovascular;  Laterality: N/A;   TEE WITHOUT CARDIOVERSION N/A 05/11/2021   Procedure: TRANSESOPHAGEAL ECHOCARDIOGRAM (TEE);  Surgeon: Lamar Blinks, MD;  Location: [facility] ORS;  Service: Cardiovascular;  Laterality: N/A;   TOTAL ABDOMINAL HYSTERECTOMY     Social History:  reports that Kristi Clark has quit smoking. Her smoking use included cigarettes. Kristi Clark has a 56 pack-year smoking history. Kristi Clark has never used smokeless tobacco. Kristi Clark reports that Kristi Clark does not drink alcohol and does  not use drugs.  Allergies  Allergen Reactions   Clindamycin/Lincomycin Hives   Prednisone Other (See Comments)    Psychosis   Amoxicillin Rash   Avelox [Moxifloxacin Hcl In Nacl] Rash   Codeine Sulfate Nausea Only   Penicillins Rash   Sulfa Antibiotics Rash    Family History  Problem Relation Age of Onset   Cancer Mother    Stroke Mother    Heart disease Father    Hyperlipidemia Father    Cancer Sister    Diabetes Sister    Breast  cancer Sister 56   Diabetes Brother    Asthma Son    Cancer Son    Diabetes Daughter    Hypertension Daughter    Cancer Maternal Grandmother        gallbladder   Diabetes Brother    Heart disease Brother    Breast cancer Paternal Aunt     Prior to Admission medications   Medication Sig Start Date End Date Taking? Authorizing Provider  albuterol (PROVENTIL) (2.5 MG/3ML) 0.083% nebulizer solution Take 3 mLs (2.5 mg total) by nebulization every 4 (four) hours as needed for wheezing or shortness of breath. 08/25/22   Laural Benes, Megan P, DO  albuterol (VENTOLIN HFA) 108 (90 Base) MCG/ACT inhaler Inhale 2 puffs into the lungs every 6 (six) hours as needed for wheezing or shortness of breath. 08/25/22   Johnson, Megan P, DO  Aloe-Sodium Chloride (AYR SALINE NASAL GEL NA) Place 1 Application into the nose as needed (dry nares).    [provider]  amiodarone (PACERONE) 200 MG tablet Take 1 tablet (200 mg total) by mouth daily. 10/03/22   Scoggins, Amber, NP  amLODipine (NORVASC) 5 MG tablet Take 1 tablet (5 mg total) by mouth daily. 11/29/22   Laurier Nancy, MD  aspirin 81 MG tablet Take 81 mg by mouth daily.    [provider]  atorvastatin (LIPITOR) 40 MG tablet Take 1 tablet (40 mg total) by mouth daily. 08/25/22   Olevia Perches P, DO  Blood Pressure Monitoring (BLOOD PRESSURE MONITOR/WRIST) KIT Take Blood pressure as needed, Dx: I12.9 12/09/20   Johnson, Megan P, DO  cetirizine (ZYRTEC) 10 MG tablet Take 10 mg by mouth daily.    [provider]  clopidogrel (PLAVIX) 75 MG tablet Take 1 tablet (75 mg total) by mouth daily. 08/25/22   Olevia Perches P, DO  diclofenac Sodium (VOLTAREN) 1 % GEL Apply 4 g topically 4 (four) times daily. 05/25/22   Johnson, Megan P, DO  feeding supplement (ENSURE ENLIVE / ENSURE PLUS) LIQD Take 237 mLs by mouth 2 (two) times daily between meals. 04/21/22   Darlin Priestly, MD  ferrous sulfate 325 (65 FE) MG tablet Take 1 tablet (325 mg total) by mouth  daily with breakfast. 03/09/22   Esaw Grandchild A, DO  fluticasone (FLONASE) 50 MCG/ACT nasal spray Place 2 sprays into both nostrils daily. 11/10/22   Mecum, Erin E, PA-C  Fluticasone-Umeclidin-Vilant (TRELEGY ELLIPTA) 100-62.5-25 MCG/ACT AEPB 100 mcg DAILY (route: inhalation) 08/25/22   Olevia Perches P, DO  furosemide (LASIX) 20 MG tablet AS needed 10/11/22   Laurier Nancy, MD  hydrALAZINE (APRESOLINE) 25 MG tablet Take 1 tablet (25 mg total) by mouth 2 (two) times daily. 08/25/22   Johnson, Megan P, DO  isosorbide mononitrate (IMDUR) 60 MG 24 hr tablet Take 1 tablet (60 mg total) by mouth 2 (two) times daily. 08/25/22   Johnson, Megan P, DO  losartan (COZAAR) 25 MG tablet Take 1  tablet (25 mg total) by mouth daily. 08/25/22   Johnson, Megan P, DO  metoprolol succinate (TOPROL-XL) 50 MG 24 hr tablet Take 1 tablet (50 mg total) by mouth daily. Take with or immediately following a meal. 08/25/22   Johnson, Megan P, DO  metoprolol tartrate (LOPRESSOR) 50 MG tablet Take by mouth.    [provider]  mirtazapine (REMERON) 45 MG tablet Take 1 tablet (45 mg total) by mouth at bedtime. 08/25/22   Olevia Perches P, DO  Multiple Vitamin (MULTIVITAMIN WITH MINERALS) TABS tablet Take 1 tablet by mouth daily. 04/22/22   Darlin Priestly, MD  nystatin (MYCOSTATIN) 100000 UNIT/ML suspension Take 5 mLs (500,000 Units total) by mouth 4 (four) times daily. 03/18/22   Johnson, Megan P, DO  pantoprazole (PROTONIX) 40 MG tablet Take 1 tablet (40 mg total) by mouth 2 (two) times daily. 08/25/22 08/25/23  Olevia Perches P, DO  polyethylene glycol powder (GLYCOLAX/MIRALAX) 17 GM/SCOOP powder Take 17 g by mouth 3 (three) times daily. 03/03/22   Johnson, Megan P, DO  QUEtiapine (SEROQUEL XR) 50 MG TB24 24 hr tablet Take 2 tablets (100 mg total) by mouth at bedtime. 08/25/22   Dorcas Carrow, DO    Physical Exam: Vitals:   11/30/22 1445 11/30/22 1558 11/30/22 1703 11/30/22 1754  BP: 114/71  (!) 158/54   Pulse: 90  93   Resp:  (!) 22  20   Temp:  98.4 F (36.9 C) 98.7 F (37.1 C)   TempSrc:  Oral Oral   SpO2: 93%  93% 95%  Weight:      Height:       Physical Exam Vitals and nursing note reviewed.  Constitutional:      General: Kristi Clark is not in acute distress.    Appearance: Kristi Clark is normal weight.  HENT:     Head: Normocephalic and atraumatic.  Eyes:     Extraocular Movements: Extraocular movements intact.     Pupils: Pupils are equal, round, and reactive to light.  Cardiovascular:     Rate and Rhythm: Normal rate and regular rhythm.     Heart sounds: Murmur (3/6 holosystolic murmur) heard.  Pulmonary:     Effort: Tachypnea present. No accessory muscle usage.     Breath sounds: Decreased breath sounds (Throughout), wheezing (Throughout) and rales (Right side only) present.  Abdominal:     Palpations: Abdomen is soft.     Tenderness: There is no abdominal tenderness.  Musculoskeletal:     Right lower leg: No edema.     Left lower leg: No edema.  Skin:    General: Skin is warm and dry.  Neurological:     General: No focal deficit present.     Mental Status: Kristi Clark is alert and oriented to person, place, and time.  Psychiatric:        Mood and Affect: Mood normal.        Behavior: Behavior normal.    Data Reviewed: CBC with WBC of 14.2, hemoglobin of 8.9, MCV 90, platelets of 180 BMP with sodium of 132, potassium 3.2, bicarb 26, BUN 21, creatinine 1.02 with GFR 58, calcium 8.0, albumin 2.8, AST 60, ALT 47  Troponin at 14 with flat trend to 18  BNP 473  Procalcitonin 2.63 COVID-19 PCR negative  EKG personally reviewed..  Significant motion artifact makes it difficult to assess if this is sinus rhythm or atrial fibrillation.  Will need to repeat  Chest x-ray personally reviewed.  Notable right upper lobe opacity with  a pleural effusion that appears to be tracking towards the opacity concerning for loculated effusion vs empyema   CT Chest Wo Contrast  Result Date: 11/30/2022 CLINICAL DATA:   Shortness of breath EXAM: CT CHEST WITHOUT CONTRAST TECHNIQUE: Multidetector CT imaging of the chest was performed following the standard protocol without IV contrast. RADIATION DOSE REDUCTION: This exam was performed according to the departmental dose-optimization program which includes automated exposure control, adjustment of the mA and/or kV according to patient size and/or use of iterative reconstruction technique. COMPARISON:  CT 01/14/2022, chest x-ray 11/30/2022 FINDINGS: Cardiovascular: Limited evaluation without intravenous contrast. Advanced aortic atherosclerosis. No aneurysm. Coronary vascular calcifications. Cardiomegaly. Small pericardial effusion Mediastinum/Nodes: Midline trachea. No suspicious thyroid mass. Multiple mildly enlarged mediastinal lymph nodes. Right upper paratracheal node measures 11 mm. Precarinal lymph node measures 1.4 cm. Subcarinal lymph node measures 1.7 cm. Esophagus within normal limits. Lungs/Pleura: Advanced emphysema. Small right-sided pleural effusion. Consolidation within the right upper lobe and heterogeneous ground-glass density and partial consolidation at the right middle lobe with smaller foci of consolidation in the peripheral left lung base. No pneumothorax Upper Abdomen: No acute finding. Possible small kidney stones versus intrarenal vascular calcification Musculoskeletal: No acute or suspicious osseous abnormality. IMPRESSION: 1. Advanced emphysema. 2. Consolidation within the right upper lobe and heterogeneous ground-glass density and partial consolidation at the right middle lobe with smaller foci of consolidation in the peripheral left lung base. Findings are consistent with multifocal pneumonia. Imaging follow-up to resolution is recommended. 3. Small right-sided pleural effusion. 4. Cardiomegaly with small pericardial effusion. Aortic Atherosclerosis (ICD10-I70.0) and Emphysema (ICD10-J43.9). Electronically Signed   By: Jasmine Pang M.D.   On: 11/30/2022  16:52   DG Chest Portable 1 View  Result Date: 11/30/2022 CLINICAL DATA:  Shortness of breath. Recent vaccination. Baseline 3 liters/minute oxygen at home. Hypoxia. EXAM: PORTABLE CHEST 1 VIEW COMPARISON:  03/04/2022 FINDINGS: Lateral consolidation in the right upper lobe favoring pneumonia, less likely pulmonary hemorrhage. Right pleural thickening and blunting of the right costophrenic angle compatible with moderate right pleural effusion. Centrilobular emphysema. Mild cardiomegaly. Atherosclerotic calcification of the aortic arch. IMPRESSION: 1. Lateral consolidation in the right upper lobe favoring pneumonia, less likely pulmonary hemorrhage. 2. Moderate right pleural effusion. 3. Centrilobular emphysema. 4. Mild cardiomegaly. Electronically Signed   By: Gaylyn Rong M.D.   On: 11/30/2022 13:31    Results are pending, will review when available.  Assessment and Plan:  * CAP (community acquired pneumonia) Patient is presenting with 2-day history of fever, 1 day history of malaise and rapid onset productive cough and shortness of breath that began last evening.  Chest x-ray demonstrates a right upper lobe opacity, however Kristi Clark also has a pleural effusion that seems to be tracking upwards towards the area of pneumonia concerning for a complicated parapneumonic effusion versus empyema.  Although patient meets SIRS criteria, there is no evidence of life-threatening organ dysfunction to suggest sepsis (per 2016 SEPSIS-3 guidelines).  Patient is certainly at very high risk though.   - Telemetry monitoring - Continue azithromycin and ceftriaxone - MRSA PCR pending - Blood cultures - Strep pneumo and Legionella urinary antigens - Expectorated sputum culture if possible  Pleural effusion on right Chest x-ray with concerning effusion that appears communicating with right upper lobe pneumonia concerning for loculated effusion versus empyema.  - CT chest pending - Will consult IR versus  pulmonology for thoracentesis if needed - Please see above for management of pneumonia  COPD with acute exacerbation (HCC) In the setting of  complicated pneumonia.  - S/p Solu-Medrol 125 mg once - Start prednisone 40 mg tomorrow to complete a 5-day course - DuoNebs every 6 hours - Pulmicort twice daily  Chronic respiratory failure with hypoxia (HCC) - Continue home 3 L supplemental oxygen  AKI (acute kidney injury) (HCC) I suspect this is due to poor p.o. intake in the setting of recurrent fevers.  - IV fluids as ordered - Avoid nephrotoxic agents - Repeat BMP in the a.m.  Elevated LFTs History of recurrent LFT elevations that have been chronic and appears stable at this time  History of CAD (coronary artery disease) - Continue home aspirin, Plavix and statin  Paroxysmal A-fib (HCC) History of paroxysmal atrial fibrillation not on anticoagulation.  - Continue home amiodarone and metoprolol  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 (HCC) At this time, patient appears hypovolemic in the setting of pneumonia and AKI.  - Hold home diuretic - Daily weights  Essential hypertension Patient is diastolic pressures have been quite low since admitted, so we will hold home medications for now.  - Resume home antihypertensives once blood pressure improves after IV fluids  Advance Care Planning:   Code Status: Limited: Do not attempt resuscitation (DNR) -DNR-LIMITED -Do Not Intubate/DNI .  Patient confirms that this is still in line with her wishes.  Consults: None  Family Communication: Patient's daughter updated at bedside  Severity of Illness: The appropriate patient status for this patient is INPATIENT. Inpatient status is judged to be reasonable and necessary in order to provide the required intensity of service to ensure the patient's safety. The patient's presenting symptoms, physical exam findings, and initial radiographic and  laboratory data in the context of their chronic comorbidities is felt to place them at high risk for further clinical deterioration. Furthermore, it is not anticipated that the patient will be medically stable for discharge from the hospital within 2 midnights of admission.   * I certify that at the point of admission it is my clinical judgment that the patient will require inpatient hospital care spanning beyond 2 midnights from the point of admission due to high intensity of service, high risk for further deterioration and high frequency of surveillance required.*  Author: Verdene Lennert, MD 11/30/2022 5:54 PM  For on call review [email or web address].

## 2022-11-30 NOTE — Assessment & Plan Note (Signed)
History of recurrent LFT elevations that have been chronic and appears stable at this time

## 2022-11-30 NOTE — Telephone Encounter (Signed)
  Chief Complaint: low O2 sat Symptoms: struggling to breath, low O2 sat on O2- 67%, fever Frequency: started yesterday- worse over night  Disposition: [x] ED /[] Urgent Care (no appt availability in office) / [] Appointment(In office/virtual)/ []  [facility]/ [] Home Care/ [] Refused Recommended Disposition /[] [place] [facility]/ []  Follow-up with PCP Additional Notes: Patient's daughter is calling to report patient is having hard time breathing-advised ED- call 911 for transport

## 2022-11-30 NOTE — ED Notes (Addendum)
Attempted to call MD to report critical lactic acid. MD paged to call back this RN's phone number.

## 2022-11-30 NOTE — Assessment & Plan Note (Signed)
In the setting of complicated pneumonia.  - S/p Solu-Medrol 125 mg once - Start prednisone 40 mg tomorrow to complete a 5-day course - DuoNebs every 6 hours - Pulmicort twice daily

## 2022-11-30 NOTE — Assessment & Plan Note (Signed)
Under good control on current regimen. Continue current regimen. Continue to monitor. Call with any concerns. Refills given. Continue oxygen. Follow up with pulmonology as scheduled.

## 2022-11-30 NOTE — Assessment & Plan Note (Signed)
History of paroxysmal atrial fibrillation not on anticoagulation.  - Continue home amiodarone and metoprolol

## 2022-11-30 NOTE — Assessment & Plan Note (Signed)
Stable. Diastolic still quite low. Continue current regimen. Continue to monitor. Call with any concerns.

## 2022-11-30 NOTE — Assessment & Plan Note (Signed)
-  Continue home 3 L supplemental oxygen

## 2022-11-30 NOTE — ED Triage Notes (Signed)
Pt presents to ED with c/o of SOB, COVID and Flu vaccine given this past Friday. Per EMS pt wears 3L/min at baseline. Pt was 76% on RA, pt placed on NRB and sats are now 93% on 3L/min.   125 mg of solumedrol 1 duoneb by EMS   20G R AC  CBG 98

## 2022-11-30 NOTE — Progress Notes (Addendum)
PHARMACIST - PHYSICIAN COMMUNICATION  CONCERNING:  Enoxaparin (Lovenox) for DVT Prophylaxis    RECOMMENDATION: Patient was prescribed enoxaprin 40mg  q24 hours for VTE prophylaxis.   Filed Weights   11/30/22 1111  Weight: 51.7 kg (114 lb)    Body mass index is 23.03 kg/m.  Estimated Creatinine Clearance: 33.5 mL/min (A) (by C-G formula based on SCr of 1.02 mg/dL (H)).  Patient is candidate for enoxaparin 40 mg every 24 hours based on CrCl > 79ml/min   DESCRIPTION: Pharmacy has adjusted enoxaparin dose per [facility] policy.  Patient is now receiving enoxaparin 40 mg every 24 hours    Merryl Hacker, PharmD Clinical Pharmacist  11/30/2022 3:24 PM

## 2022-11-30 NOTE — Assessment & Plan Note (Signed)
At this time, patient appears hypovolemic in the setting of pneumonia and AKI.  - Hold home diuretic - Daily weights

## 2022-11-30 NOTE — Assessment & Plan Note (Signed)
Under good control on current regimen. Continue current regimen. Continue to monitor. Call with any concerns. Refills given. Labs done in June. Continue to monitor.

## 2022-11-30 NOTE — Assessment & Plan Note (Addendum)
Patient is presenting with 2-day history of fever, 1 day history of malaise and rapid onset productive cough and shortness of breath that began last evening.  Chest x-ray demonstrates a right upper lobe opacity, however she also has a pleural effusion that seems to be tracking upwards towards the area of pneumonia concerning for a complicated parapneumonic effusion versus empyema.  Although patient meets SIRS criteria, there is no evidence of life-threatening organ dysfunction to suggest sepsis (per 2016 SEPSIS-3 guidelines).  Patient is certainly at very high risk though.   - Telemetry monitoring - Continue azithromycin and ceftriaxone - MRSA PCR pending - Blood cultures - Strep pneumo and Legionella urinary antigens - Expectorated sputum culture if possible

## 2022-11-30 NOTE — Assessment & Plan Note (Signed)
Under good control on current regimen. Continue current regimen. Continue to monitor. Call with any concerns. Refills given.   

## 2022-11-30 NOTE — ED Provider Notes (Signed)
[facility] Provider Note    Event Date/Time   First MD Initiated Contact with Patient 11/30/22 1124     (approximate)   History   Shortness of Breath   HPI  Kristi Clark is a 72 y.o. 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 Pt reports that several other people in her family have had "a cold" recently. She had her flu and covid vaccines on Friday, but felt fine until Sunday night/Monday when she began to get nasal congestion, cough, and SOB. Her SOB has persisted since then along with severe fatigue, wheezing. She has had difficulty getting around the house today. She has had subjective fevers. No h/o similar reactions to flu or covid vaccines in the past. No new medications.       Physical Exam   Triage Vital Signs: ED Triage Vitals  Encounter Vitals Group     BP 11/30/22 1115 136/72     Systolic BP Percentile --      Diastolic BP Percentile --      Pulse Rate 11/30/22 1115 88     Resp 11/30/22 1115 (!) 25     Temp 11/30/22 1115 99.9 F (37.7 C)     Temp Source 11/30/22 1115 Oral     SpO2 11/30/22 1115 91 %     Weight 11/30/22 1111 114 lb (51.7 kg)     Height 11/30/22 1111 4\' 11"  (1.499 m)     Head Circumference --      Peak Flow --      Pain Score 11/30/22 1111 0     Pain Loc --      Pain Education --      Exclude from Growth Chart --     Most recent vital signs: Vitals:   11/30/22 1703 11/30/22 1754  BP: (!) 158/54   Pulse: 93   Resp: 20   Temp: 98.7 F (37.1 C)   SpO2: 93% 95%     General: Awake, no distress.  CV:  Good peripheral perfusion. Regular rate, rhythm. Resp:  Increased WOB with diffuse wheezes, markedly diminished aeration. Abd:  No distention. No tenderness. Other:  1+ edema bl LE   ED Results / Procedures / Treatments   Labs (all labs ordered are listed, but only abnormal results are displayed) Labs Reviewed  BASIC METABOLIC PANEL - Abnormal; Notable for  the following components:      Result Value   Sodium 132 (*)    Potassium 3.2 (*)    Chloride 97 (*)    Creatinine, Ser 1.02 (*)    Calcium 8.0 (*)    GFR, Estimated 58 (*)    All other components within normal limits  CBC - Abnormal; Notable for the following components:   WBC 14.2 (*)    RBC 2.78 (*)    Hemoglobin 8.9 (*)    HCT 27.3 (*)    All other components within normal limits  BRAIN NATRIURETIC PEPTIDE - Abnormal; Notable for the following components:   B Natriuretic Peptide 473.9 (*)    All other components within normal limits  HEPATIC FUNCTION PANEL - Abnormal; Notable for the following components:   Total Protein 6.1 (*)    Albumin 2.8 (*)    AST 60 (*)    ALT 47 (*)    All other components within normal limits  LACTIC ACID, PLASMA - Abnormal; Notable for the following components:  Lactic Acid, Venous 3.9 (*)    All other components within normal limits  TROPONIN I (HIGH SENSITIVITY) - Abnormal; Notable for the following components:   Troponin I (High Sensitivity) 18 (*)    All other components within normal limits  SARS CORONAVIRUS 2 BY RT PCR  CULTURE, BLOOD (ROUTINE X 2)  CULTURE, BLOOD (ROUTINE X 2)  EXPECTORATED SPUTUM ASSESSMENT W GRAM STAIN, RFLX TO RESP C  MRSA NEXT GEN BY PCR, NASAL  PROCALCITONIN  STREP PNEUMONIAE URINARY ANTIGEN  LEGIONELLA PNEUMOPHILA SEROGP 1 UR AG  HIV ANTIBODY (ROUTINE TESTING W REFLEX)  CBC WITH DIFFERENTIAL/PLATELET  COMPREHENSIVE METABOLIC PANEL  LACTIC ACID, PLASMA  TROPONIN I (HIGH SENSITIVITY)     EKG Normal sinus rhythm, VR 85. PR 169, QRS 111, QTc 389. No acute ST elevations or depressions. No ischemia or infarct.   RADIOLOGY CXR: RUL PNA   I also independently reviewed and agree with radiologist interpretations.   PROCEDURES:  Critical Care performed: No  .1-3 Lead EKG Interpretation  Performed by: Shaune Pollack, MD Authorized by: Shaune Pollack, MD     Interpretation: normal     ECG rate:   80-100   ECG rate assessment: normal     Rhythm: sinus rhythm     Ectopy: none     Conduction: normal   Comments:     Indication: SOB .Critical Care  Performed by: Shaune Pollack, MD Authorized by: Shaune Pollack, MD   Critical care provider statement:    Critical care time (minutes):  30   Critical care was necessary to treat or prevent imminent or life-threatening deterioration of the following conditions:  Cardiac failure, circulatory failure and sepsis   Critical care was time spent personally by me on the following activities:  Development of treatment plan with patient or surrogate, discussions with consultants, evaluation of patient's response to treatment, examination of patient, ordering and review of laboratory studies, ordering and review of radiographic studies, ordering and performing treatments and interventions, pulse oximetry, re-evaluation of patient's condition and review of old charts     MEDICATIONS ORDERED IN ED: Medications  cefTRIAXone (ROCEPHIN) 2 g in sodium chloride 0.9 % 100 mL IVPB (has no administration in time range)  azithromycin (ZITHROMAX) 500 mg in sodium chloride 0.9 % 250 mL IVPB (has no administration in time range)  predniSONE (DELTASONE) tablet 40 mg (has no administration in time range)  ipratropium-albuterol (DUONEB) 0.5-2.5 (3) MG/3ML nebulizer solution 3 mL (3 mLs Nebulization Not Given 11/30/22 1735)  budesonide (PULMICORT) nebulizer solution 0.25 mg (has no administration in time range)  enoxaparin (LOVENOX) injection 40 mg (has no administration in time range)  lactated ringers infusion ( Intravenous New Bag/Given 11/30/22 1823)  acetaminophen (TYLENOL) tablet 650 mg (650 mg Oral Given 11/30/22 1810)  amiodarone (PACERONE) tablet 200 mg (has no administration in time range)  atorvastatin (LIPITOR) tablet 40 mg (has no administration in time range)  clopidogrel (PLAVIX) tablet 75 mg (has no administration in time range)  ferrous sulfate  tablet 325 mg (has no administration in time range)  metoprolol succinate (TOPROL-XL) 24 hr tablet 50 mg (has no administration in time range)  hydrALAZINE (APRESOLINE) tablet 25 mg (has no administration in time range)  isosorbide mononitrate (IMDUR) 24 hr tablet 60 mg (has no administration in time range)  polyethylene glycol powder (GLYCOLAX/MIRALAX) container 17 g (has no administration in time range)  QUEtiapine (SEROQUEL XR) 24 hr tablet 100 mg (has no administration in time range)  pantoprazole (PROTONIX) EC tablet 40  mg (has no administration in time range)  mirtazapine (REMERON) tablet 45 mg (has no administration in time range)  aspirin EC tablet 81 mg (has no administration in time range)  methylPREDNISolone sodium succinate (SOLU-MEDROL) 125 mg/2 mL injection 125 mg (125 mg Intravenous Given 11/30/22 1208)  acetaminophen (TYLENOL) tablet 1,000 mg (1,000 mg Oral Given 11/30/22 1208)  ipratropium-albuterol (DUONEB) 0.5-2.5 (3) MG/3ML nebulizer solution 3 mL (3 mLs Nebulization Given 11/30/22 1208)  ipratropium-albuterol (DUONEB) 0.5-2.5 (3) MG/3ML nebulizer solution 3 mL (3 mLs Nebulization Given 11/30/22 1208)  ipratropium-albuterol (DUONEB) 0.5-2.5 (3) MG/3ML nebulizer solution 3 mL (3 mLs Nebulization Given 11/30/22 1208)  cefTRIAXone (ROCEPHIN) 2 g in sodium chloride 0.9 % 100 mL IVPB (0 g Intravenous Stopped 11/30/22 1448)  azithromycin (ZITHROMAX) 500 mg in sodium chloride 0.9 % 250 mL IVPB (0 mg Intravenous Stopped 11/30/22 1700)  sodium chloride 0.9 % bolus 500 mL (500 mLs Intravenous New Bag/Given 11/30/22 1450)  lactated ringers bolus 500 mL (500 mLs Intravenous New Bag/Given 11/30/22 1721)     IMPRESSION / MDM / ASSESSMENT AND PLAN / ED COURSE  I reviewed the triage vital signs and the nursing notes.                              Differential diagnosis includes, but is not limited to, COPD exacerbation, COVID-19, other URI, PNA, CHF, ACS  Patient's presentation is most  consistent with acute presentation with potential threat to life or bodily function.  The patient is on the cardiac monitor to evaluate for evidence of arrhythmia and/or significant heart rate changes  72 yo F with PMHx COPD on chronic O2, CHF, here with SOB. Initial suspicion for COPD exacerbation, though CXR is concerning for possible PNA. Pt afebrile but is hypoxic, tachycardic. WBC 14.2. BCx sent and IV ABX started for sepsis 2/2 CAP. Solumedrol,nebs x 3 given as well. Pt otherwise appears well with no hypotension. She has a h/o CHF and BNP is elevated. Initial 500 cc bolus was ordered. LA returned at 3.9 - hospitalist is aware. Suspect this is multifactorial 2/2 her pneumonia but also acute resp illness with increased WOB.    FINAL CLINICAL IMPRESSION(S) / ED DIAGNOSES   Final diagnoses:  Community acquired pneumonia of right lung, unspecified part of lung  Acute on chronic hypoxic respiratory failure (HCC)     Rx / DC Orders   ED Discharge Orders     None        Note:  This document was prepared using Dragon voice recognition software and may include unintentional dictation errors.   Shaune Pollack, MD 11/30/22 [phone]

## 2022-11-30 NOTE — Telephone Encounter (Signed)
Reason for Disposition  [1] MODERATE difficulty breathing (e.g., speaks in phrases, SOB even at rest) AND [2] worse than normal  Answer Assessment - Initial Assessment Questions 1. MAIN CONCERN OR SYMPTOM: "What's your main concern?" (e.g., low oxygen level, breathing difficulty) "What question do you have?"     SOB, O2 sat below 80 2.  OXYGEN EQUIPMENT:  Are you having trouble with your oxygen equipment?  (e.g., cannula, mask, tubing, tank, concentrator)     No problem reported 3. ONSET: "When did the  trouble breathing  start?"      Yesterday- significant over night 4. OXYGEN THERAPY:    - "Do you currently use home oxygen?" (e.g., yes, no).    - If Yes, ask: "What is your oxygen source?" (e.g., O2 tank, O2 concentrator).    - If Yes, ask: "How do you get the oxygen?" (e.g., nasal prongs, face mask).    - If Yes, ask: "How much oxygen are you supposed to use?" (e.g., 1-2 L [place])     Yes, concentrator, nasal canula, 3L 5. PULSE OXIMETER:    - "Do you have a pulse oximeter (pulse ox)?"  (e.g., yes, no)    - If Yes, ask: "Where do you place the probe?" (e.g., fingertip, ear lobe)     Yes, finger 6. O2 MONITORING: "What is the oxygen level (pulse ox reading)?" (e.g., 70-100%)     67% 7. VSS MONITORING: "Do you monitor/measure your oxygen level or vital signs (e.g., yes, no, measurements are automatically sent to provider/call center). Document CURRENT and NORMAL BASELINE values if available.     -  P: "What is your pulse rate per minute?"   -  RR: "What is your respiratory rate per minute?"     *No Answer* 8. BREATHING DIFFICULTY: "Are you having any difficulty breathing?" If Yes, ask: "How bad is it?"  (e.g., none, mild, moderate, severe)   - MILD: No SOB at rest, mild SOB with walking, speaks normally in sentences, able to lie down, no retractions, pulse < 100.   - MODERATE: SOB at rest, SOB with minimal exertion and prefers to sit, cannot lie down flat, speaks in phrases, mild retractions,  audible wheezing, pulse 100-120.   - SEVERE: Very SOB at rest, speaks in single words, struggling to breathe, sitting hunched forward, retractions, pulse > 120      100HR, struggling  9. OTHER SYMPTOMS: "Do you have any other symptoms?" (e.g., fever, change in sputum)     Fever 102 10. SMOKING: "Do you smoke currently?" "Is there anyone that smokes around you?"  (Note: smoking around oxygen is dangerous!)       no  Protocols used: COPD Oxygen Monitoring and Hypoxia-A-AH

## 2022-11-30 NOTE — Telephone Encounter (Signed)
Agree with ER.

## 2022-11-30 NOTE — Assessment & Plan Note (Signed)
Chest x-ray with concerning effusion that appears communicating with right upper lobe pneumonia concerning for loculated effusion versus empyema.  - CT chest pending - Will consult IR versus pulmonology for thoracentesis if needed - Please see above for management of pneumonia

## 2022-11-30 NOTE — Assessment & Plan Note (Signed)
Patient is diastolic pressures have been quite low since admitted, so we will hold home medications for now.  - Resume home antihypertensives once blood pressure improves after IV fluids

## 2022-11-30 NOTE — Assessment & Plan Note (Signed)
-  Continue home aspirin, Plavix and statin

## 2022-11-30 NOTE — ED Notes (Signed)
Lab called to obtain blood cultures.

## 2022-12-01 ENCOUNTER — Ambulatory Visit: Payer: Medicare Other | Admitting: Nurse Practitioner

## 2022-12-01 DIAGNOSIS — R6521 Severe sepsis with septic shock: Secondary | ICD-10-CM

## 2022-12-01 DIAGNOSIS — A419 Sepsis, unspecified organism: Secondary | ICD-10-CM

## 2022-12-01 LAB — CBC WITH DIFFERENTIAL/PLATELET
Abs Immature Granulocytes: 0.17 10*3/uL — ABNORMAL HIGH (ref 0.00–0.07)
Basophils Absolute: 0 10*3/uL (ref 0.0–0.1)
Basophils Relative: 0 %
Eosinophils Absolute: 0 10*3/uL (ref 0.0–0.5)
Eosinophils Relative: 0 %
HCT: 22 % — ABNORMAL LOW (ref 36.0–46.0)
Hemoglobin: 7.5 g/dL — ABNORMAL LOW (ref 12.0–15.0)
Immature Granulocytes: 1 %
Lymphocytes Relative: 7 %
Lymphs Abs: 1.1 10*3/uL (ref 0.7–4.0)
MCH: 32.2 pg (ref 26.0–34.0)
MCHC: 34.1 g/dL (ref 30.0–36.0)
MCV: 94.4 fL (ref 80.0–100.0)
Monocytes Absolute: 0.6 10*3/uL (ref 0.1–1.0)
Monocytes Relative: 4 %
Neutro Abs: 12.9 10*3/uL — ABNORMAL HIGH (ref 1.7–7.7)
Neutrophils Relative %: 88 %
Platelets: 175 10*3/uL (ref 150–400)
RBC: 2.33 MIL/uL — ABNORMAL LOW (ref 3.87–5.11)
RDW: 13.6 % (ref 11.5–15.5)
WBC: 14.8 10*3/uL — ABNORMAL HIGH (ref 4.0–10.5)
nRBC: 0 % (ref 0.0–0.2)

## 2022-12-01 LAB — COMPREHENSIVE METABOLIC PANEL
ALT: 55 U/L — ABNORMAL HIGH (ref 0–44)
AST: 64 U/L — ABNORMAL HIGH (ref 15–41)
Albumin: 2.6 g/dL — ABNORMAL LOW (ref 3.5–5.0)
Alkaline Phosphatase: 64 U/L (ref 38–126)
Anion gap: 8 (ref 5–15)
BUN: 19 mg/dL (ref 8–23)
CO2: 25 mmol/L (ref 22–32)
Calcium: 8.4 mg/dL — ABNORMAL LOW (ref 8.9–10.3)
Chloride: 98 mmol/L (ref 98–111)
Creatinine, Ser: 0.69 mg/dL (ref 0.44–1.00)
GFR, Estimated: >60 mL/min (ref >60)
Glucose, Bld: 115 mg/dL — ABNORMAL HIGH (ref 70–99)
Potassium: 3.8 mmol/L (ref 3.5–5.1)
Sodium: 131 mmol/L — ABNORMAL LOW (ref 135–145)
Total Bilirubin: 0.3 mg/dL (ref 0.3–1.2)
Total Protein: 6.1 g/dL — ABNORMAL LOW (ref 6.5–8.1)

## 2022-12-01 LAB — CULTURE, BLOOD (ROUTINE X 2): Special Requests: ADEQUATE

## 2022-12-01 LAB — HIV ANTIBODY (ROUTINE TESTING W REFLEX): HIV Screen 4th Generation wRfx: NONREACTIVE

## 2022-12-01 MED ORDER — ALBUTEROL SULFATE (2.5 MG/3ML) 0.083% IN NEBU
2.5000 mg | INHALATION_SOLUTION | RESPIRATORY_TRACT | Status: DC | PRN
  Administered 2022-12-03: 2.5 mg via RESPIRATORY_TRACT
  Filled 2022-12-01: qty 3

## 2022-12-01 MED ORDER — IPRATROPIUM-ALBUTEROL 0.5-2.5 (3) MG/3ML IN SOLN
3.0000 mL | Freq: Three times a day (TID) | RESPIRATORY_TRACT | Status: DC
  Administered 2022-12-01 – 2022-12-07 (×19): 3 mL via RESPIRATORY_TRACT
  Filled 2022-12-01 (×17): qty 3

## 2022-12-01 MED ORDER — SALINE SPRAY 0.65 % NA SOLN
1.0000 | NASAL | Status: DC | PRN
  Administered 2022-12-01: 1 via NASAL
  Filled 2022-12-01: qty 44

## 2022-12-01 MED ORDER — LACTATED RINGERS IV BOLUS
500.0000 mL | Freq: Once | INTRAVENOUS | Status: AC
  Administered 2022-12-01: 500 mL via INTRAVENOUS

## 2022-12-01 NOTE — TOC Initial Note (Signed)
Transition of Care [facility]) - Initial/Assessment Note    Patient Details  Name: Kristi Clark MRN: [id number] Date of Birth: [birth year]  [facility] of [facility] CM/SW Contact:    Chapman Fitch, RN Phone Number: 12/01/2022, 11:31 AM  Clinical Narrative:                  Admitted for: CAP Admitted from: Home with daughter Kristi Clark  PCP: Olevia Perches - daughter Kristi Clark provides transportation Current home health/prior home health/DME: 3 L O2 through [employer], rollator, [facility]  Daughter Kristi Clark at bedside Therapy recommending home health.  Patient would also benefit from home health RN due to dx of CHF and COPD.  They are agreeable home health services, Kristi Clark states they do not have a preference of home health agency.  Referral made and accepted by Barbara Cower with [facility]  Kristi Clark states that her or her sister will transport at discharge, and will bring portable O2    Expected Discharge Plan: Home w Home Health Services     Patient Goals and CMS Choice            Expected Discharge Plan and Services                                              Prior Living Arrangements/Services                       Activities of Daily Living   ADL Screening (condition at time of admission) Does the patient have a NEW difficulty with bathing/dressing/toileting/self-feeding that is expected to last >3 days?: No Does the patient have a NEW difficulty with getting in/out of bed, walking, or climbing stairs that is expected to last >3 days?: No Does the patient have a NEW difficulty with communication that is expected to last >3 days?: No Is the patient deaf or have difficulty hearing?: No Does the patient have difficulty seeing, even when wearing glasses/contacts?: No Does the patient have difficulty concentrating, remembering, or making decisions?: Yes  Permission Sought/Granted                  Emotional Assessment              Admission diagnosis:   CAP (community acquired pneumonia) [J18.9] Patient Active Problem List   Diagnosis Date Noted   Elevated LFTs 11/30/2022   CAP (community acquired pneumonia) 11/30/2022   AKI (acute kidney injury) (HCC) 11/30/2022   Pleural effusion on right 11/30/2022   Malnutrition of moderate degree 04/20/2022   Acute on chronic hypoxic respiratory failure (HCC) 04/18/2022   Atrial fibrillation with RVR (HCC) 04/18/2022   Sepsis due to pneumonia (HCC) 04/18/2022   Acute on chronic diastolic CHF (congestive heart failure) (HCC) 04/18/2022   Hypokalemia 04/18/2022   Paroxysmal A-fib (HCC) 03/05/2022   Fall at home, initial encounter 03/05/2022   History of CAD (coronary artery disease) 03/05/2022   Chronic diastolic CHF (congestive heart failure) (HCC) 03/05/2022   DNR (do not resuscitate) 03/05/2022   Acute on chronic respiratory failure with hypoxemia (HCC) 03/04/2022   Hypotension due to drugs 02/10/2022   Confusion 02/10/2022   Depression, recurrent (HCC) 02/02/2022   Normocytic anemia 01/13/2022   Atrial fibrillation with RVR (HCC) 01/13/2022   Bilateral pneumonia 11/23/2021   Chronic respiratory failure with hypoxia (HCC) 09/21/2021   Valvular vegetation  05/06/2021   Malnutrition of moderate degree 04/29/2021   Unintentional weight loss 04/28/2021   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 (HCC) 03/15/2021   Aortic atherosclerosis (HCC) 05/27/2020   COPD with acute exacerbation (HCC) 02/17/2020   Postherpetic neuralgia 11/24/2019   Nicotine dependence, cigarettes, w unsp disorders 10/17/2017   IBS (irritable bowel syndrome) 04/04/2017   Goals of care, counseling/discussion 10/03/2016   CAD (coronary artery disease) 10/03/2016   Iron deficiency anemia 10/03/2016   Essential hypertension 03/06/2015   Allergic rhinitis 09/03/2014   GERD (gastroesophageal reflux disease) 09/03/2014   Hyperlipidemia 09/03/2014   COPD, severe (HCC) 08/08/2013    Pulmonary nodules 08/08/2013   PCP:  Dorcas Carrow, DO Pharmacy:   [facility] [address] ([address]), [place] - [address] [place] ([place]) [place] [address] Phone: [phone] Fax: [phone]  [facility] - [place], [place] - [address] [facility] [address] [place] [address] Phone: [phone] Fax: [phone]     Social Determinants of Health (SDOH) Social History: SDOH Screenings   Food Insecurity: No Food Insecurity (11/30/2022)  Housing: Low Risk  (11/30/2022)  Transportation Needs: No Transportation Needs (11/30/2022)  Utilities: Not At Risk (11/30/2022)  Alcohol Screen: Low Risk  (06/13/2022)  Depression (PHQ2-9): Low Risk  (11/25/2022)  Financial Resource Strain: Low Risk  (06/13/2022)  Physical Activity: Insufficiently Active (06/13/2022)  Social Connections: Socially Isolated (06/13/2022)  Stress: No Stress Concern Present (06/13/2022)  Tobacco Use: Medium Risk (11/30/2022)   SDOH Interventions:     Readmission Risk Interventions    03/06/2022   10:27 AM  Readmission Risk Prevention Plan  Transportation Screening Complete  PCP or Specialist Appt within 3-5 Days Complete  HRI or Home Care Consult Complete  Social Work Consult for Recovery Care Planning/Counseling Complete  Palliative Care Screening Not Applicable  Medication Review Oceanographer) Complete

## 2022-12-01 NOTE — Evaluation (Addendum)
Occupational Therapy Evaluation Patient Details Name: Kristi Clark MRN: [id number] DOB: 1950/05/03 Today's Date: 12/01/2022   History of Present Illness 72 y.o. female with PMHx of end-stage COPD, chronic hypoxic respiratory failure on 3 L, atrial fibrillation not on AC, CAD, HFpEF, hypertension, hyperlipidemia, who presents to the ED due to shortness of breath, found a pleural effusion. Covid and flu vaccine received Friday 9/20. Dx of CAP (community acquired pneumonia).   Clinical Impression   Pt was seen for OT evaluation this date. Prior to hospital admission, pt was living with her daughter and requring some assistance with ADL/IADLs. She ambulated using a rollator and had 1 step inside the home to get down to the bathroom. Pt was using a BSC in her room as well. Daughter reports pt mostly sponge bathed, but would occasionally get in tub/shower and sit on [facility] with assistance from daughter.   Pt presents to acute OT demonstrating impaired ADL performance and functional mobility 2/2 SOB/low endurance from chronic COPD and new CAP (See OT problem list for additional functional deficits). Pt currently requires MOD I for bed mobility and CGA/SBA for STS and SPT from bed<>BSC. Sp02 sats monitored throughout session and were 98% on 3L at rest, dropped to 91% on 4L with activity. HR up to 110 with activity. OT provided education to daughter and pt on use of long handled equipment/devices to use for energy conservation. Pt would benefit from skilled OT services to address noted impairments and functional limitations (see below for any additional details) in order to maximize safety and independence while minimizing falls risk and caregiver burden. Would likely benefit from [facility] services if able to return home with 24/7 assistance from family.      If plan is discharge home, recommend the following: A little help with walking and/or transfers;A little help with bathing/dressing/bathroom;Help with  stairs or ramp for entrance;Assist for transportation;Assistance with cooking/housework    Functional Status Assessment  Patient has had a recent decline in their functional status and demonstrates the ability to make significant improvements in function in a reasonable and predictable amount of time.  Equipment Recommendations  Tub/shower seat;Other (comment) (reacher, long handled sponge)    Recommendations for Other Services       Precautions / Restrictions Precautions Precautions: Fall Restrictions Weight Bearing Restrictions: No      Mobility Bed Mobility Overal bed mobility: Modified Independent                  Transfers Overall transfer level: Needs assistance   Transfers: Sit to/from Stand, Bed to chair/wheelchair/BSC Sit to Stand: Supervision Stand pivot transfers: Contact guard assist, Supervision                Balance Overall balance assessment: Needs assistance Sitting-balance support: Feet supported Sitting balance-Leahy Scale: Good       Standing balance-Leahy Scale: Good                             ADL either performed or assessed with clinical judgement   ADL Overall ADL's : Needs assistance/impaired                     Lower Body Dressing: Contact guard assist;Supervision/safety Lower Body Dressing Details (indicate cue type and reason): CGA to SBA for safety in standing to doff pants for toileting Toilet Transfer: BSC/3in1;Supervision/safety;Contact guard assist;Stand-pivot Toilet Transfer Details (indicate cue type and reason): SPT to BSC from EOB  with CGA/SBA for safety Toileting- Clothing Manipulation and Hygiene: Contact guard assist;Supervision/safety         General ADL Comments: MOD I for bed mobility, good seated balance, SPT from EOB<>BSC with CGA/SBA for safety with increase to 4L for activity     Vision Patient Visual Report: No change from baseline       Perception         Praxis          Pertinent Vitals/Pain Pain Assessment Pain Assessment: No/denies pain     Extremity/Trunk Assessment Upper Extremity Assessment Upper Extremity Assessment: Overall WFL for tasks assessed           Communication Communication Communication: No apparent difficulties   Cognition Arousal: Alert   Overall Cognitive Status: Within Functional Limits for tasks assessed                                       General Comments  02 98 on 3L at rest, increased to 4L to prepare for activity with lowest sp02 reading of 91% and HR up to 110 with activity    Exercises Other Exercises Other Exercises: provided education to daughter and pt on use of long handled equipment/devices to use for energy conservation   Shoulder Instructions      Home Living Family/patient expects to be discharged to:: Private residence Living Arrangements: Children;Other relatives Available Help at Discharge: Family;Available 24 hours/day Type of Home: House             Bathroom Shower/Tub: Tub/shower unit;Sponge bathes at baseline (mostly sponge bathes, per daughter she occassionally takes a shower seated on [facility] with assistance from daughter)   Firefighter: Standard     Home Equipment: Rollator (4 wheels);BSC/3in1   Additional Comments: Pt lives with her daughter and other family members. mostly sponge bathes, per daughter she occassionally takes a shower seated on [facility] with assistance from daughter      Prior Functioning/Environment Prior Level of Function : Needs assist       Physical Assist : Mobility (physical);ADLs (physical) Mobility (physical): Gait ADLs (physical): Bathing;IADLs Mobility Comments: reports using rollator in home ADLs Comments: has assist from daughter for bathing in bathtub, was IND with dressing/toileting        OT Problem List: Decreased strength;Impaired balance (sitting and/or standing);Pain;Cardiopulmonary status limiting activity;Decreased  activity tolerance;Decreased knowledge of use of DME or AE      OT Treatment/Interventions: Self-care/ADL training;Therapeutic exercise;Patient/family education;Energy conservation;Therapeutic activities;DME and/or AE instruction    OT Goals(Current goals can be found in the care plan section) Acute Rehab OT Goals Patient Stated Goal: return home OT Goal Formulation: With patient/family Time For Goal Achievement: 12/15/22 Potential to Achieve Goals: Good ADL Goals Pt Will Perform Lower Body Bathing: sit to/from stand;bed level;with supervision Pt Will Transfer to Toilet: with supervision;regular height toilet Additional ADL Goal #1: Pt will demonstrate use of one learned energy conservation strategy during completion of ADL tasks to promote improved activity tolerance.  OT Frequency: Min 1X/week    Co-evaluation              AM-PAC OT "6 Clicks" Daily Activity     Outcome Measure Help from another person eating meals?: None Help from another person taking care of personal grooming?: None Help from another person toileting, which includes using toliet, bedpan, or urinal?: A Little Help from another person bathing (including washing, rinsing, drying)?:  A Little Help from another person to put on and taking off regular upper body clothing?: None Help from another person to put on and taking off regular lower body clothing?: A Little 6 Click Score: 21   End of Session Equipment Utilized During Treatment: Oxygen Nurse Communication: Mobility status  Activity Tolerance: Patient tolerated treatment well;Patient limited by fatigue Patient left: in bed;with nursing/sitter in room;with family/visitor present  OT Visit Diagnosis: Other abnormalities of gait and mobility (R26.89);Muscle weakness (generalized) (M62.81);History of falling (Z91.81)                Time: 4098-1191 OT Time Calculation (min): 13 min Charges:  OT General Charges $OT Visit: 1 Visit OT Evaluation $OT Eval  Moderate Complexity: 1 Mod  Jujuan Dugo, OTR/L 12/01/22, 10:59 AM

## 2022-12-01 NOTE — Progress Notes (Signed)
Progress Note    Kristi Clark  FIE:[id number] DOB: 08-11-1950  DOA: 11/30/2022 PCP: Dorcas Carrow, DO      Brief Narrative:    Medical records reviewed and are as summarized below:  Kristi Clark is a 72 y.o. 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  She also complained of subjective fever and sweating at night.      Assessment/Plan:   Principal Problem:   Septic shock (HCC) Active Problems:   CAP (community acquired pneumonia)   Pleural effusion on right   COPD with acute exacerbation (HCC)   Chronic respiratory failure with hypoxia (HCC)   AKI (acute kidney injury) (HCC)   Essential hypertension   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 (HCC)   Paroxysmal A-fib (HCC)   History of CAD (coronary artery disease)   Elevated LFTs    Body mass index is 23.03 kg/m.   Septic shock secondary to community-acquired multifocal pneumonia: Respiratory rate up to 27, heart rate up to 96, WBC 14.8 and lactate 4.5. Give additional fluids with Ringer's lactate 500 mL bolus.  Continue IV ceftriaxone and azithromycin.  Follow-up blood cultures.   Small right-sided pleural effusion: No indication for thoracentesis at this time.   Advanced COPD with exacerbation: Continue steroids and bronchodilators   Chronic hypoxemic respiratory failure: Continue 3 L/min oxygen via nasal cannula.   Paroxysmal atrial fibrillation: Continue amiodarone and metoprolol.   AKI: Improved.  Monitor BMP.   Elevated liver enzymes: May be due to sepsis.  She also has history of elevated liver enzymes.  Of note patient is status post cholecystectomy.  Monitor liver enzymes.   CAD: Continue aspirin, Plavix and  statin   Chronic diastolic CHF: Compensated   Diet Order             Diet regular Room service appropriate? Yes; Fluid consistency: Thin  Diet effective now                            Consultants: None  Procedures: None    Medications:    amiodarone  200 mg Oral Daily   aspirin EC  81 mg Oral Daily   atorvastatin  40 mg Oral Q supper   budesonide (PULMICORT) nebulizer solution  0.25 mg Nebulization BID   clopidogrel  75 mg Oral Daily   enoxaparin (LOVENOX) injection  40 mg Subcutaneous Q24H   ferrous sulfate  325 mg Oral Q breakfast   hydrALAZINE  25 mg Oral BID   ipratropium-albuterol  3 mL Nebulization TID   isosorbide mononitrate  60 mg Oral Daily   metoprolol succinate  50 mg Oral Daily   mirtazapine  45 mg Oral QHS   pantoprazole  40 mg Oral BID   predniSONE  40 mg Oral Q breakfast   QUEtiapine  100 mg Oral QHS   Continuous Infusions:  azithromycin 500 mg (12/01/22 1033)   cefTRIAXone (ROCEPHIN)  IV 2 g (12/01/22 0947)     Anti-infectives (From admission, onward)    Start     Dose/Rate Route Frequency Ordered Stop   12/01/22 1000  cefTRIAXone (ROCEPHIN) 2 g in sodium chloride 0.9 % 100 mL IVPB        2 g 200 mL/hr over 30  Minutes Intravenous Every 24 hours 11/30/22 1505 12/06/22 0959   12/01/22 1000  azithromycin (ZITHROMAX) 500 mg in sodium chloride 0.9 % 250 mL IVPB        500 mg 250 mL/hr over 60 Minutes Intravenous Every 24 hours 11/30/22 1505 12/06/22 0959   11/30/22 1600  metroNIDAZOLE (FLAGYL) IVPB 500 mg  Status:  Discontinued        500 mg 100 mL/hr over 60 Minutes Intravenous Every 12 hours 11/30/22 1512 11/30/22 1753   11/30/22 1315  cefTRIAXone (ROCEPHIN) 2 g in sodium chloride 0.9 % 100 mL IVPB        2 g 200 mL/hr over 30 Minutes Intravenous  Once 11/30/22 1311 11/30/22 1448   11/30/22 1315  azithromycin (ZITHROMAX) 500 mg in sodium chloride 0.9 % 250 mL IVPB        500 mg 250 mL/hr over 60 Minutes Intravenous  Once  11/30/22 1311 11/30/22 1700              Family Communication/Anticipated D/C date and plan/Code Status   DVT prophylaxis: enoxaparin (LOVENOX) injection 40 mg Start: 11/30/22 2200     Code Status: Limited: Do not attempt resuscitation (DNR) -DNR-LIMITED -Do Not Intubate/DNI   Family Communication: Plan discussed with daughter at the bedside Disposition Plan: Plan to discharge home   Status is: Inpatient Remains inpatient appropriate because: Septic shock from pneumonia       Subjective:   Interval events noted.  She complains of shortness of breath with minimal exertion.  She also has a productive cough.  No chest pain.  Her daughter was at the bedside.  Objective:    Vitals:   12/01/22 0349 12/01/22 0810 12/01/22 0855 12/01/22 1043  BP: (!) 157/47  (!) 180/52   Pulse: 93  (!) 101 (!) 110  Resp: 18  20   Temp: 98.1 F (36.7 C)  98.7 F (37.1 C)   TempSrc: Oral     SpO2: 96% 93% 99% 91%  Weight:      Height:       No data found.   Intake/Output Summary (Last 24 hours) at 12/01/2022 1418 Last data filed at 12/01/2022 0300 Gross per 24 hour  Intake 860.37 ml  Output --  Net 860.37 ml   Filed Weights   11/30/22 1111  Weight: 51.7 kg    Exam:  GEN: NAD SKIN: Warm and dry EYES: No pallor or icterus ENT: MMM CV: RRR PULM: Bibasilar rales, no wheezing ABD: soft, ND, NT, +BS CNS: AAO x 3, non focal EXT: No edema or tenderness        Data Reviewed:   I have personally reviewed following labs and imaging studies:  Labs: Labs show the following:   Basic Metabolic Panel: Recent Labs  Lab 11/30/22 1003 12/01/22 0627  NA 132* 131*  K 3.2* 3.8  CL 97* 98  CO2 26 25  GLUCOSE 97 115*  BUN 21 19  CREATININE 1.02* 0.69  CALCIUM 8.0* 8.4*   GFR Estimated Creatinine Clearance: 42.7 mL/min (by C-G formula based on SCr of 0.69 mg/dL). Liver Function Tests: Recent Labs  Lab 11/30/22 1205 12/01/22 0627  AST 60* 64*  ALT 47* 55*   ALKPHOS 76 64  BILITOT 0.6 0.3  PROT 6.1* 6.1*  ALBUMIN 2.8* 2.6*   No results for input(s): "LIPASE", "AMYLASE" in the last 168 hours. No results for input(s): "AMMONIA" in the last 168 hours. Coagulation profile No results for input(s): "INR", "PROTIME" in the last  168 hours.  CBC: Recent Labs  Lab 11/30/22 1003 12/01/22 0627  WBC 14.2* 14.8*  NEUTROABS  --  12.9*  HGB 8.9* 7.5*  HCT 27.3* 22.0*  MCV 98.2 94.4  PLT 180 175   Cardiac Enzymes: No results for input(s): "CKTOTAL", "CKMB", "CKMBINDEX", "TROPONINI" in the last 168 hours. BNP (last 3 results) No results for input(s): "PROBNP" in the last 8760 hours. CBG: No results for input(s): "GLUCAP" in the last 168 hours. D-Dimer: No results for input(s): "DDIMER" in the last 72 hours. Hgb A1c: No results for input(s): "HGBA1C" in the last 72 hours. Lipid Profile: No results for input(s): "CHOL", "HDL", "LDLCALC", "TRIG", "CHOLHDL", "LDLDIRECT" in the last 72 hours. Thyroid function studies: No results for input(s): "TSH", "T4TOTAL", "T3FREE", "THYROIDAB" in the last 72 hours.  Invalid input(s): "FREET3" Anemia work up: No results for input(s): "VITAMINB12", "FOLATE", "FERRITIN", "TIBC", "IRON", "RETICCTPCT" in the last 72 hours. Sepsis Labs: Recent Labs  Lab 11/30/22 1003 11/30/22 1205 11/30/22 1447 11/30/22 1916 12/01/22 0627  PROCALCITON  --  2.63  --   --   --   WBC 14.2*  --   --   --  14.8*  LATICACIDVEN  --   --  3.9* 4.5*  --     Microbiology Recent Results (from the past 240 hour(s))  SARS Coronavirus 2 by RT PCR (hospital order, performed in [facility] hospital lab) *cepheid single result test* Anterior Nasal Swab     Status: None   Collection Time: 11/30/22 12:05 PM   Specimen: Anterior Nasal Swab  Result Value Ref Range Status   SARS Coronavirus 2 by RT PCR NEGATIVE NEGATIVE Final    Comment: (NOTE) SARS-CoV-2 target nucleic acids are NOT DETECTED.  The SARS-CoV-2 RNA is generally  detectable in upper and lower respiratory specimens during the acute phase of infection. The lowest concentration of SARS-CoV-2 viral copies this assay can detect is 250 copies / mL. A negative result does not preclude SARS-CoV-2 infection and should not be used as the sole basis for treatment or other patient management decisions.  A negative result may occur with improper specimen collection / handling, submission of specimen other than nasopharyngeal swab, presence of viral mutation(s) within the areas targeted by this assay, and inadequate number of viral copies (<250 copies / mL). A negative result must be combined with clinical observations, patient history, and epidemiological information.  Fact Sheet for Patients:   RoadLapTop.co.za  Fact Sheet for Healthcare Providers: http://kim-miller.com/  This test is not yet approved or  cleared by the [place] FDA and has been authorized for detection and/or diagnosis of SARS-CoV-2 by FDA under an Emergency Use Authorization (EUA).  This EUA will remain in effect (meaning this test can be used) for the duration of the COVID-19 declaration under Section 564(b)(1) of the Act, 21 U.S.C. section 360bbb-3(b)(1), unless the authorization is terminated or revoked sooner.  Performed at [facility], [address] [place], [place] [address]   Blood culture (routine x 2)     Status: None (Preliminary result)   Collection Time: 11/30/22 12:58 PM   Specimen: BLOOD  Result Value Ref Range Status   Specimen Description BLOOD BLOOD RIGHT HAND  Final   Special Requests   Final    BOTTLES DRAWN AEROBIC AND ANAEROBIC Blood Culture results may not be optimal due to an excessive volume of blood received in culture bottles   Culture   Final    NO GROWTH < 24 HOURS Performed at  [facility], [address], [place], [place] [address]    Report Status PENDING  Incomplete  Blood  culture (routine x 2)     Status: None (Preliminary result)   Collection Time: 11/30/22  1:06 PM   Specimen: BLOOD  Result Value Ref Range Status   Specimen Description BLOOD BLOOD RIGHT HAND  Final   Special Requests   Final    BOTTLES DRAWN AEROBIC AND ANAEROBIC Blood Culture adequate volume   Culture   Final    NO GROWTH < 24 HOURS Performed at [facility], [address], [place], [place] [address]    Report Status PENDING  Incomplete    Procedures and diagnostic studies:  CT Chest Wo Contrast  Result Date: 11/30/2022 CLINICAL DATA:  Shortness of breath EXAM: CT CHEST WITHOUT CONTRAST TECHNIQUE: Multidetector CT imaging of the chest was performed following the standard protocol without IV contrast. RADIATION DOSE REDUCTION: This exam was performed according to the departmental dose-optimization program which includes automated exposure control, adjustment of the mA and/or kV according to patient size and/or use of iterative reconstruction technique. COMPARISON:  CT 01/14/2022, chest x-ray 11/30/2022 FINDINGS: Cardiovascular: Limited evaluation without intravenous contrast. Advanced aortic atherosclerosis. No aneurysm. Coronary vascular calcifications. Cardiomegaly. Small pericardial effusion Mediastinum/Nodes: Midline trachea. No suspicious thyroid mass. Multiple mildly enlarged mediastinal lymph nodes. Right upper paratracheal node measures 11 mm. Precarinal lymph node measures 1.4 cm. Subcarinal lymph node measures 1.7 cm. Esophagus within normal limits. Lungs/Pleura: Advanced emphysema. Small right-sided pleural effusion. Consolidation within the right upper lobe and heterogeneous ground-glass density and partial consolidation at the right middle lobe with smaller foci of consolidation in the peripheral left lung base. No pneumothorax Upper Abdomen: No acute finding. Possible small kidney stones versus intrarenal vascular calcification Musculoskeletal: No acute or suspicious  osseous abnormality. IMPRESSION: 1. Advanced emphysema. 2. Consolidation within the right upper lobe and heterogeneous ground-glass density and partial consolidation at the right middle lobe with smaller foci of consolidation in the peripheral left lung base. Findings are consistent with multifocal pneumonia. Imaging follow-up to resolution is recommended. 3. Small right-sided pleural effusion. 4. Cardiomegaly with small pericardial effusion. Aortic Atherosclerosis (ICD10-I70.0) and Emphysema (ICD10-J43.9). Electronically Signed   By: Jasmine Pang M.D.   On: 11/30/2022 16:52   DG Chest Portable 1 View  Result Date: 11/30/2022 CLINICAL DATA:  Shortness of breath. Recent vaccination. Baseline 3 liters/minute oxygen at home. Hypoxia. EXAM: PORTABLE CHEST 1 VIEW COMPARISON:  03/04/2022 FINDINGS: Lateral consolidation in the right upper lobe favoring pneumonia, less likely pulmonary hemorrhage. Right pleural thickening and blunting of the right costophrenic angle compatible with moderate right pleural effusion. Centrilobular emphysema. Mild cardiomegaly. Atherosclerotic calcification of the aortic arch. IMPRESSION: 1. Lateral consolidation in the right upper lobe favoring pneumonia, less likely pulmonary hemorrhage. 2. Moderate right pleural effusion. 3. Centrilobular emphysema. 4. Mild cardiomegaly. Electronically Signed   By: Gaylyn Rong M.D.   On: 11/30/2022 13:31               LOS: 1 day   Michelle Wnek  [facility]   Pager on [email or web address]. If 7PM-7AM, please contact night-coverage at [email or web address]     12/01/2022, 2:18 PM

## 2022-12-01 NOTE — Care Management Important Message (Signed)
Important Message  Patient Details  Name: Kristi Clark MRN: [id number] Date of Birth: 19-Dec-1950   Important Message Given:  N/A - LOS <3 / Initial given by admissions     [name] 12/01/2022, 5:33 PM

## 2022-12-01 NOTE — Evaluation (Signed)
Physical Therapy Evaluation Patient Details Name: Kristi Clark MRN: [id number] DOB: 16-Nov-1950 Today's Date: 12/01/2022  History of Present Illness  72 y.o. female with PMHx of end-stage COPD, chronic hypoxic respiratory failure on 3 L, atrial fibrillation not on AC, CAD, HFpEF, hypertension, hyperlipidemia, who presents to the ED due to shortness of breath, found a pleural effusion. Covid and flu vaccine received Friday 9/20. Dx of CAP (community acquired pneumonia).   Clinical Impression  Patient A&Ox4, agreeable to PT, family at bedside. Pt reported for the last month she has had more difficulty with ADLs but performs them modI/I as able (sink bath, dresses herself) ambulates with a walker. Lives with several family members which provide nearly 24/7 supervision/assistance as needed, especially for IADLs and stair navigation.   She was able to perform supine to sit modI from a elevated HOB. Several minutes in sitting due to SOB prior to mobility, placed on 4L due to desaturation to 87%. She was able to ambulate ~39ft with RW and CGA  but needed >5 minutes to recover and for spO2 to return to >88%. Seated in bed with needs in reach at end of session, returned to 3L, RN notified of pt status.  Overall the patient demonstrated deficits (see "PT Problem List") that impede the patient's functional abilities, safety, and mobility and would benefit from skilled PT intervention.          If plan is discharge home, recommend the following: A little help with walking and/or transfers;A little help with bathing/dressing/bathroom;Assistance with cooking/housework;Assist for transportation;Assistance with feeding;Help with stairs or ramp for entrance   Can travel by private vehicle        Equipment Recommendations None recommended by PT  Recommendations for Other Services       Functional Status Assessment Patient has had a recent decline in their functional status and demonstrates the ability to  make significant improvements in function in a reasonable and predictable amount of time.     Precautions / Restrictions Precautions Precautions: Fall Restrictions Weight Bearing Restrictions: No      Mobility  Bed Mobility Overal bed mobility: Modified Independent                  Transfers Overall transfer level: Needs assistance Equipment used: Quad cane Transfers: Sit to/from Stand Sit to Stand: Supervision                Ambulation/Gait Ambulation/Gait assistance: Contact guard assist Gait Distance (Feet): 5 Feet Assistive device: Rolling walker (2 wheels)         General Gait Details: slow, but limited by SOB more than strength  Stairs            Wheelchair Mobility     Tilt Bed    Modified Rankin (Stroke Patients Only)       Balance Overall balance assessment: Needs assistance Sitting-balance support: Feet supported Sitting balance-Leahy Scale: Good     Standing balance support: Bilateral upper extremity supported Standing balance-Leahy Scale: Fair                               Pertinent Vitals/Pain Pain Assessment Pain Assessment: No/denies pain    Home Living Family/patient expects to be discharged to:: Private residence Living Arrangements: Children;Other relatives Available Help at Discharge: Family;Available 24 hours/day Type of Home: House Home Access: Stairs to enter Entrance Stairs-Rails: None Entrance Stairs-Number of Steps: 4, daughter provides handheld assist  Home Layout: One level Home Equipment: Rollator (4 wheels);BSC/3in1 Additional Comments: Pt lives with her daughter and other family members. mostly sponge bathes, per daughter she occassionally takes a shower seated on [facility] with assistance from daughter    Prior Function Prior Level of Function : Needs assist       Physical Assist : Mobility (physical);ADLs (physical) Mobility (physical): Gait ADLs (physical): Bathing;IADLs Mobility  Comments: reports using rollator in home ADLs Comments: has assist from daughter for bathing in bathtub, was IND with dressing/toileting     Extremity/Trunk Assessment   Upper Extremity Assessment Upper Extremity Assessment: Defer to OT evaluation    Lower Extremity Assessment Lower Extremity Assessment: Generalized weakness (able to moveBLE against gravity)       Communication   Communication Communication: No apparent difficulties  Cognition Arousal: Alert Behavior During Therapy: WFL for tasks assessed/performed, Anxious (a bit anxous with mobility) Overall Cognitive Status: Within Functional Limits for tasks assessed                                          General Comments General comments (skin integrity, edema, etc.): on 4L once seated EOB, returned to 3L after activity >90%    Exercises     Assessment/Plan    PT Assessment Patient needs continued PT services  PT Problem List Cardiopulmonary status limiting activity;Decreased strength;Decreased activity tolerance;Decreased balance;Decreased mobility       PT Treatment Interventions DME instruction;Neuromuscular re-education;Gait training;Stair training;Patient/family education;Functional mobility training;Therapeutic activities;Therapeutic exercise;Balance training    PT Goals (Current goals can be found in the Care Plan section)  Acute Rehab PT Goals Patient Stated Goal: to breathe better PT Goal Formulation: With patient Time For Goal Achievement: 12/15/22 Potential to Achieve Goals: Good    Frequency Min 1X/week     Co-evaluation               AM-PAC PT "6 Clicks" Mobility  Outcome Measure Help needed turning from your back to your side while in a flat bed without using bedrails?: None Help needed moving from lying on your back to sitting on the side of a flat bed without using bedrails?: None Help needed moving to and from a bed to a chair (including a wheelchair)?: A  Little Help needed standing up from a chair using your arms (e.g., wheelchair or bedside chair)?: A Little Help needed to walk in hospital room?: A Little Help needed climbing 3-5 steps with a railing? : A Little 6 Click Score: 20    End of Session Equipment Utilized During Treatment: Gait belt Activity Tolerance: Treatment limited secondary to medical complications (Comment);Other (comment) (limited by SOB) Patient left: in bed;with family/visitor present;with call bell/phone within reach Nurse Communication: Mobility status PT Visit Diagnosis: Other abnormalities of gait and mobility (R26.89);Difficulty in walking, not elsewhere classified (R26.2);Muscle weakness (generalized) (M62.81)    Time: 9629-5284 PT Time Calculation (min) (ACUTE ONLY): 26 min   Charges:   PT Evaluation $PT Eval Low Complexity: 1 Low PT Treatments $Therapeutic Activity: 8-22 mins PT General Charges $$ ACUTE PT VISIT: 1 Visit        Olga Coaster PT, DPT 12:02 PM,12/01/22

## 2022-12-01 NOTE — Plan of Care (Signed)

## 2022-12-02 ENCOUNTER — Inpatient Hospital Stay: Payer: Medicare Other

## 2022-12-02 DIAGNOSIS — A419 Sepsis, unspecified organism: Secondary | ICD-10-CM | POA: Diagnosis not present

## 2022-12-02 DIAGNOSIS — R6521 Severe sepsis with septic shock: Secondary | ICD-10-CM | POA: Diagnosis not present

## 2022-12-02 LAB — CBC WITH DIFFERENTIAL/PLATELET
Abs Immature Granulocytes: 0.14 10*3/uL — ABNORMAL HIGH (ref 0.00–0.07)
Basophils Absolute: 0 10*3/uL (ref 0.0–0.1)
Basophils Relative: 0 %
Eosinophils Absolute: 0 10*3/uL (ref 0.0–0.5)
Eosinophils Relative: 0 %
HCT: 22.6 % — ABNORMAL LOW (ref 36.0–46.0)
Hemoglobin: 7.5 g/dL — ABNORMAL LOW (ref 12.0–15.0)
Immature Granulocytes: 1 %
Lymphocytes Relative: 7 %
Lymphs Abs: 1 10*3/uL (ref 0.7–4.0)
MCH: 31.5 pg (ref 26.0–34.0)
MCHC: 33.2 g/dL (ref 30.0–36.0)
MCV: 95 fL (ref 80.0–100.0)
Monocytes Absolute: 0.5 10*3/uL (ref 0.1–1.0)
Monocytes Relative: 4 %
Neutro Abs: 12.1 10*3/uL — ABNORMAL HIGH (ref 1.7–7.7)
Neutrophils Relative %: 88 %
Platelets: 190 10*3/uL (ref 150–400)
RBC: 2.38 MIL/uL — ABNORMAL LOW (ref 3.87–5.11)
RDW: 13.6 % (ref 11.5–15.5)
WBC: 13.7 10*3/uL — ABNORMAL HIGH (ref 4.0–10.5)
nRBC: 0 % (ref 0.0–0.2)

## 2022-12-02 LAB — COMPREHENSIVE METABOLIC PANEL
ALT: 47 U/L — ABNORMAL HIGH (ref 0–44)
AST: 43 U/L — ABNORMAL HIGH (ref 15–41)
Albumin: 2.5 g/dL — ABNORMAL LOW (ref 3.5–5.0)
Alkaline Phosphatase: 56 U/L (ref 38–126)
Anion gap: 6 (ref 5–15)
BUN: 21 mg/dL (ref 8–23)
CO2: 29 mmol/L (ref 22–32)
Calcium: 8.5 mg/dL — ABNORMAL LOW (ref 8.9–10.3)
Chloride: 96 mmol/L — ABNORMAL LOW (ref 98–111)
Creatinine, Ser: 0.63 mg/dL (ref 0.44–1.00)
GFR, Estimated: >60 mL/min (ref >60)
Glucose, Bld: 124 mg/dL — ABNORMAL HIGH (ref 70–99)
Potassium: 3.9 mmol/L (ref 3.5–5.1)
Sodium: 131 mmol/L — ABNORMAL LOW (ref 135–145)
Total Bilirubin: 0.4 mg/dL (ref 0.3–1.2)
Total Protein: 5.8 g/dL — ABNORMAL LOW (ref 6.5–8.1)

## 2022-12-02 LAB — LACTIC ACID, PLASMA: Lactic Acid, Venous: 0.6 mmol/L (ref 0.5–1.9)

## 2022-12-02 LAB — PROCALCITONIN: Procalcitonin: 2.59 ng/mL

## 2022-12-02 MED ORDER — ENSURE ENLIVE PO LIQD
237.0000 mL | Freq: Two times a day (BID) | ORAL | Status: DC
  Administered 2022-12-03 – 2022-12-09 (×10): 237 mL via ORAL

## 2022-12-02 MED ORDER — FUROSEMIDE 10 MG/ML IJ SOLN
40.0000 mg | Freq: Once | INTRAMUSCULAR | Status: AC
  Administered 2022-12-02: 40 mg via INTRAVENOUS
  Filled 2022-12-02: qty 4

## 2022-12-02 NOTE — Progress Notes (Signed)
Physical Therapy Treatment Patient Details Name: Kristi Clark MRN: [id number] DOB: Feb 17, 1951 Today's Date: 12/02/2022   History of Present Illness 72 y.o. female with PMHx of end-stage COPD, chronic hypoxic respiratory failure on 3 L, atrial fibrillation not on AC, CAD, HFpEF, hypertension, hyperlipidemia, who presents to the ED due to shortness of breath, found a pleural effusion. Covid and flu vaccine received Friday 9/20. Dx of CAP (community acquired pneumonia).    PT Comments  Pt was long sitting in bed upon arrival.  Earlier in the morning, pt was on her baseline 3 L o2 however she reports that she needed to be increased to 4 L due to desaturation. Pt remains SOB even at rest. She was able to exit bed, stand, and ambulate in room ~ 20 ft however becomes severely SOB with desaturation to 80%. Pt required increase O2 supply to 8L + ~ 4 minutes seated rest to recover to > 88%. Eventually O2 was weaned back to 4L with sao2 remaining > 90%. Pt's respiratory response to activity greatly limits her abilities. Acute PT will continue to follow and progress as able per current POC.    If plan is discharge home, recommend the following: A little help with walking and/or transfers;A little help with bathing/dressing/bathroom;Assistance with cooking/housework;Assist for transportation;Assistance with feeding;Help with stairs or ramp for entrance     Equipment Recommendations  None recommended by PT       Precautions / Restrictions Precautions Precautions: Fall Restrictions Weight Bearing Restrictions: No     Mobility  Bed Mobility Overal bed mobility: Modified Independent  Transfers Overall transfer level: Needs assistance Equipment used: Rolling walker (2 wheels) Transfers: Sit to/from Stand Sit to Stand: Supervision  General transfer comment: pt was on 3 L o2 earlier in the morning however had to increase to 4 L even with just long sitting in bed. SOB is noted throughout session that  gets worse with activity    Ambulation/Gait Ambulation/Gait assistance: Supervision Gait Distance (Feet): 25 Feet Assistive device: Rolling walker (2 wheels) Gait Pattern/deviations: Step-through pattern Gait velocity: WNL  General Gait Details: Pt was able to ambulate ~ 25 ft however O2 quickly desaturates to 80% with 4 L o2. required seated rest x ~ 4 minutes with O2 increased to 8 L for recovery. eventually able to wean back to 4 L with sao2 90%. SOB/ O2 demands greatly impact pt's abilities    Balance Overall balance assessment: Needs assistance Sitting-balance support: Feet supported Sitting balance-Leahy Scale: Good     Standing balance support: Bilateral upper extremity supported Standing balance-Leahy Scale: Good       Cognition Arousal: Alert Behavior During Therapy: WFL for tasks assessed/performed, Anxious Overall Cognitive Status: Within Functional Limits for tasks assessed    General Comments: pt is A and O           General Comments General comments (skin integrity, edema, etc.): Pt's cardio-respirtaory response to activity is poor      Pertinent Vitals/Pain Pain Assessment Pain Assessment: No/denies pain     PT Goals (current goals can now be found in the care plan section) Acute Rehab PT Goals Patient Stated Goal: to breathe better Progress towards PT goals: Not progressing toward goals - comment (SOB and respiratory limations are limiting progress)    Frequency    Min 1X/week       AM-PAC PT "6 Clicks" Mobility   Outcome Measure  Help needed turning from your back to your side while in a flat bed without  using bedrails?: None Help needed moving from lying on your back to sitting on the side of a flat bed without using bedrails?: None Help needed moving to and from a bed to a chair (including a wheelchair)?: A Little Help needed standing up from a chair using your arms (e.g., wheelchair or bedside chair)?: A Little Help needed to walk in  hospital room?: A Little Help needed climbing 3-5 steps with a railing? : A Little 6 Click Score: 20    End of Session   Activity Tolerance: Other (comment) (pt remains severely limited by SOB) Patient left: in bed;with call bell/phone within reach Nurse Communication: Mobility status PT Visit Diagnosis: Other abnormalities of gait and mobility (R26.89);Difficulty in walking, not elsewhere classified (R26.2);Muscle weakness (generalized) (M62.81)     Time: 9528-4132 PT Time Calculation (min) (ACUTE ONLY): 18 min  Charges:    $Gait Training: 8-22 mins PT General Charges $$ ACUTE PT VISIT: 1 Visit                     Jetta Lout PTA 12/02/22, 9:58 AM

## 2022-12-02 NOTE — Progress Notes (Addendum)
Progress Note    YOUNIQUE MY  WUJ:[id number] DOB: 1950/11/29  DOA: 11/30/2022 PCP: Dorcas Carrow, DO      Brief Narrative:    Medical records reviewed and are as summarized below:  Kristi Clark is a 72 y.o. 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  She also complained of subjective fever and sweating at night.      Assessment/Plan:   Principal Problem:   Septic shock (HCC) Active Problems:   CAP (community acquired pneumonia)   Pleural effusion on right   COPD with acute exacerbation (HCC)   Chronic respiratory failure with hypoxia (HCC)   AKI (acute kidney injury) (HCC)   Essential hypertension   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 (HCC)   Paroxysmal A-fib (HCC)   History of CAD (coronary artery disease)   Elevated LFTs    Body mass index is 23.03 kg/m.   Septic shock secondary to community-acquired multifocal pneumonia: Respiratory rate up to 27, heart rate up to 96, WBC 14.8 and lactate 4.5. Continue IV ceftriaxone and azithromycin.   Small right-sided pleural effusion: No indication for thoracentesis at this time.   Advanced COPD with exacerbation: Continue bronchodilators.  Hold prednisone for now.   Acute on chronic hypoxemic respiratory failure: Oxygen requirement went up to 5 L/min via nasal cannula.  Taper down oxygen as able.  She uses 3 L/min at baseline.   Chronic diastolic CHF: It is not clear whether she has concomitant exacerbation of CHF from recent IV fluids.  Trial of IV Lasix.  Repeat chest x-ray.   Paroxysmal atrial fibrillation: Continue amiodarone and metoprolol.   AKI: Improved.  Monitor BMP.   Elevated liver enzymes: May be due to sepsis.  She  also has history of elevated liver enzymes.  Of note patient is status post cholecystectomy.  Monitor liver enzymes.   CAD: Continue aspirin, Plavix and statin   Chronic anemia: H&H stable.   Diet Order             Diet regular Room service appropriate? Yes; Fluid consistency: Thin  Diet effective now                            Consultants: None  Procedures: None    Medications:    amiodarone  200 mg Oral Daily   aspirin EC  81 mg Oral Daily   atorvastatin  40 mg Oral Q supper   budesonide (PULMICORT) nebulizer solution  0.25 mg Nebulization BID   clopidogrel  75 mg Oral Daily   enoxaparin (LOVENOX) injection  40 mg Subcutaneous Q24H   ferrous sulfate  325 mg Oral Q breakfast   hydrALAZINE  25 mg Oral BID   ipratropium-albuterol  3 mL Nebulization TID   isosorbide mononitrate  60 mg Oral Daily   metoprolol succinate  50 mg Oral Daily   mirtazapine  45 mg Oral QHS   pantoprazole  40 mg Oral BID   QUEtiapine  100 mg Oral QHS   Continuous Infusions:  azithromycin 500 mg (12/02/22 1121)   cefTRIAXone (ROCEPHIN)  IV 2 g (12/02/22 1043)     Anti-infectives (From admission, onward)    Start     Dose/Rate Route Frequency Ordered Stop   12/01/22  1000  cefTRIAXone (ROCEPHIN) 2 g in sodium chloride 0.9 % 100 mL IVPB        2 g 200 mL/hr over 30 Minutes Intravenous Every 24 hours 11/30/22 1505 12/06/22 0959   12/01/22 1000  azithromycin (ZITHROMAX) 500 mg in sodium chloride 0.9 % 250 mL IVPB        500 mg 250 mL/hr over 60 Minutes Intravenous Every 24 hours 11/30/22 1505 12/06/22 0959   11/30/22 1600  metroNIDAZOLE (FLAGYL) IVPB 500 mg  Status:  Discontinued        500 mg 100 mL/hr over 60 Minutes Intravenous Every 12 hours 11/30/22 1512 11/30/22 1753   11/30/22 1315  cefTRIAXone (ROCEPHIN) 2 g in sodium chloride 0.9 % 100 mL IVPB        2 g 200 mL/hr over 30 Minutes Intravenous  Once 11/30/22 1311 11/30/22 1448   11/30/22 1315  azithromycin (ZITHROMAX)  500 mg in sodium chloride 0.9 % 250 mL IVPB        500 mg 250 mL/hr over 60 Minutes Intravenous  Once 11/30/22 1311 11/30/22 1700              Family Communication/Anticipated D/C date and plan/Code Status   DVT prophylaxis: enoxaparin (LOVENOX) injection 40 mg Start: 11/30/22 2200     Code Status: Limited: Do not attempt resuscitation (DNR) -DNR-LIMITED -Do Not Intubate/DNI   Family Communication: None Disposition Plan: Plan to discharge home   Status is: Inpatient Remains inpatient appropriate because: Septic shock from pneumonia       Subjective:   Interval events noted.  She complains of worsening shortness of breath especially with exertion.  She still has a cough.  Objective:    Vitals:   12/02/22 0746 12/02/22 0758 12/02/22 1149 12/02/22 1612  BP:  (!) 178/62 (!) 165/55 (!) 131/48  Pulse:  96 90 88  Resp:  20  18  Temp:  97.8 F (36.6 C)  98 F (36.7 C)  TempSrc:      SpO2: 95% 97%  96%  Weight:      Height:       No data found.   Intake/Output Summary (Last 24 hours) at 12/02/2022 1647 Last data filed at 12/02/2022 1232 Gross per 24 hour  Intake 830 ml  Output 1350 ml  Net -520 ml   Filed Weights   11/30/22 1111  Weight: 51.7 kg    Exam:  GEN: NAD SKIN: No rash EYES: No pallor or icterus ENT: MMM CV: RRR PULM: Bibasilar rales, bilateral wheezing ABD: soft, ND, NT, +BS CNS: AAO x 3, non focal EXT: No edema or tenderness         Data Reviewed:   I have personally reviewed following labs and imaging studies:  Labs: Labs show the following:   Basic Metabolic Panel: Recent Labs  Lab 11/30/22 1003 12/01/22 0627 12/02/22 0355  NA 132* 131* 131*  K 3.2* 3.8 3.9  CL 97* 98 96*  CO2 26 25 29   GLUCOSE 97 115* 124*  BUN 21 19 21   CREATININE 1.02* 0.69 0.63  CALCIUM 8.0* 8.4* 8.5*   GFR Estimated Creatinine Clearance: 42.7 mL/min (by C-G formula based on SCr of 0.63 mg/dL). Liver Function Tests: Recent Labs  Lab  11/30/22 1205 12/01/22 0627 12/02/22 0355  AST 60* 64* 43*  ALT 47* 55* 47*  ALKPHOS 76 64 56  BILITOT 0.6 0.3 0.4  PROT 6.1* 6.1* 5.8*  ALBUMIN 2.8* 2.6* 2.5*   No results for input(s): "  LIPASE", "AMYLASE" in the last 168 hours. No results for input(s): "AMMONIA" in the last 168 hours. Coagulation profile No results for input(s): "INR", "PROTIME" in the last 168 hours.  CBC: Recent Labs  Lab 11/30/22 1003 12/01/22 0627 12/02/22 0355  WBC 14.2* 14.8* 13.7*  NEUTROABS  --  12.9* 12.1*  HGB 8.9* 7.5* 7.5*  HCT 27.3* 22.0* 22.6*  MCV 98.2 94.4 95.0  PLT 180 175 190   Cardiac Enzymes: No results for input(s): "CKTOTAL", "CKMB", "CKMBINDEX", "TROPONINI" in the last 168 hours. BNP (last 3 results) No results for input(s): "PROBNP" in the last 8760 hours. CBG: No results for input(s): "GLUCAP" in the last 168 hours. D-Dimer: No results for input(s): "DDIMER" in the last 72 hours. Hgb A1c: No results for input(s): "HGBA1C" in the last 72 hours. Lipid Profile: No results for input(s): "CHOL", "HDL", "LDLCALC", "TRIG", "CHOLHDL", "LDLDIRECT" in the last 72 hours. Thyroid function studies: No results for input(s): "TSH", "T4TOTAL", "T3FREE", "THYROIDAB" in the last 72 hours.  Invalid input(s): "FREET3" Anemia work up: No results for input(s): "VITAMINB12", "FOLATE", "FERRITIN", "TIBC", "IRON", "RETICCTPCT" in the last 72 hours. Sepsis Labs: Recent Labs  Lab 11/30/22 1003 11/30/22 1205 11/30/22 1447 11/30/22 1916 12/01/22 0627 12/02/22 0355  PROCALCITON  --  2.63  --   --   --  2.59  WBC 14.2*  --   --   --  14.8* 13.7*  LATICACIDVEN  --   --  3.9* 4.5*  --  0.6    Microbiology Recent Results (from the past 240 hour(s))  SARS Coronavirus 2 by RT PCR (hospital order, performed in [facility] hospital lab) *cepheid single result test* Anterior Nasal Swab     Status: None   Collection Time: 11/30/22 12:05 PM   Specimen: Anterior Nasal Swab  Result Value Ref Range  Status   SARS Coronavirus 2 by RT PCR NEGATIVE NEGATIVE Final    Comment: (NOTE) SARS-CoV-2 target nucleic acids are NOT DETECTED.  The SARS-CoV-2 RNA is generally detectable in upper and lower respiratory specimens during the acute phase of infection. The lowest concentration of SARS-CoV-2 viral copies this assay can detect is 250 copies / mL. A negative result does not preclude SARS-CoV-2 infection and should not be used as the sole basis for treatment or other patient management decisions.  A negative result may occur with improper specimen collection / handling, submission of specimen other than nasopharyngeal swab, presence of viral mutation(s) within the areas targeted by this assay, and inadequate number of viral copies (<250 copies / mL). A negative result must be combined with clinical observations, patient history, and epidemiological information.  Fact Sheet for Patients:   RoadLapTop.co.za  Fact Sheet for Healthcare Providers: http://kim-miller.com/  This test is not yet approved or  cleared by the [place] FDA and has been authorized for detection and/or diagnosis of SARS-CoV-2 by FDA under an Emergency Use Authorization (EUA).  This EUA will remain in effect (meaning this test can be used) for the duration of the COVID-19 declaration under Section 564(b)(1) of the Act, 21 U.S.C. section 360bbb-3(b)(1), unless the authorization is terminated or revoked sooner.  Performed at [facility], [address] [place], [place] [address]   Blood culture (routine x 2)     Status: None (Preliminary result)   Collection Time: 11/30/22 12:58 PM   Specimen: BLOOD  Result Value Ref Range Status   Specimen Description BLOOD BLOOD RIGHT HAND  Final   Special Requests   Final  BOTTLES DRAWN AEROBIC AND ANAEROBIC Blood Culture results may not be optimal due to an excessive volume of blood received in culture bottles    Culture   Final    NO GROWTH 2 DAYS Performed at [facility], [address] [place], [place] [address]    Report Status PENDING  Incomplete  Blood culture (routine x 2)     Status: None (Preliminary result)   Collection Time: 11/30/22  1:06 PM   Specimen: BLOOD  Result Value Ref Range Status   Specimen Description BLOOD BLOOD RIGHT HAND  Final   Special Requests   Final    BOTTLES DRAWN AEROBIC AND ANAEROBIC Blood Culture adequate volume   Culture   Final    NO GROWTH 2 DAYS Performed at [facility], [address] [place], [place] [address]    Report Status PENDING  Incomplete    Procedures and diagnostic studies:  No results found.             LOS: 2 days   Ajanay Farve  [facility]   Pager on [email or web address]. If 7PM-7AM, please contact night-coverage at [email or web address]     12/02/2022, 4:47 PM

## 2022-12-02 NOTE — Progress Notes (Signed)
After receiving IV lasix, patient felt like she was unable to void. Assisted patient to the [facility] and she voided 500 mLs,  after voiding bladder scan showed 379 mL. Notified MD. No order for straight cath at this time.

## 2022-12-02 NOTE — Plan of Care (Signed)

## 2022-12-02 NOTE — Progress Notes (Signed)
OT Cancellation Note  Patient Details Name: Kristi Clark MRN: [id number] DOB: May 19, 1950   Cancelled Treatment:    Reason Eval/Treat Not Completed: Fatigue/lethargy limiting ability to participate. OT entered room for attempted tx session with pt demo labored breathing, coughing and reports of having a "rough night." 02 sats obtained and were 89% at rest on 3L, with 02 increased to 4L and pt at 91% at rest. Pt declined any seated EOB activity d/t SOB and plan to work with PT soon. OT will check back in at later date/time following POC as pt is willing/medically able to participate.  [name] [name] 12/02/2022, 10:05 AM

## 2022-12-03 ENCOUNTER — Inpatient Hospital Stay: Payer: Medicare Other

## 2022-12-03 ENCOUNTER — Encounter: Payer: Self-pay | Admitting: Internal Medicine

## 2022-12-03 DIAGNOSIS — A419 Sepsis, unspecified organism: Secondary | ICD-10-CM | POA: Diagnosis not present

## 2022-12-03 DIAGNOSIS — R6521 Severe sepsis with septic shock: Secondary | ICD-10-CM | POA: Diagnosis not present

## 2022-12-03 LAB — MRSA NEXT GEN BY PCR, NASAL: MRSA by PCR Next Gen: NOT DETECTED

## 2022-12-03 LAB — CBC WITH DIFFERENTIAL/PLATELET
Abs Immature Granulocytes: 0.12 10*3/uL — ABNORMAL HIGH (ref 0.00–0.07)
Basophils Absolute: 0 10*3/uL (ref 0.0–0.1)
Basophils Relative: 0 %
Eosinophils Absolute: 0 10*3/uL (ref 0.0–0.5)
Eosinophils Relative: 0 %
HCT: 24.8 % — ABNORMAL LOW (ref 36.0–46.0)
Hemoglobin: 8.1 g/dL — ABNORMAL LOW (ref 12.0–15.0)
Immature Granulocytes: 1 %
Lymphocytes Relative: 16 %
Lymphs Abs: 1.9 10*3/uL (ref 0.7–4.0)
MCH: 31.2 pg (ref 26.0–34.0)
MCHC: 32.7 g/dL (ref 30.0–36.0)
MCV: 95.4 fL (ref 80.0–100.0)
Monocytes Absolute: 0.7 10*3/uL (ref 0.1–1.0)
Monocytes Relative: 6 %
Neutro Abs: 9 10*3/uL — ABNORMAL HIGH (ref 1.7–7.7)
Neutrophils Relative %: 77 %
Platelets: 214 10*3/uL (ref 150–400)
RBC: 2.6 MIL/uL — ABNORMAL LOW (ref 3.87–5.11)
RDW: 14.1 % (ref 11.5–15.5)
WBC: 11.7 10*3/uL — ABNORMAL HIGH (ref 4.0–10.5)
nRBC: 0.3 % — ABNORMAL HIGH (ref 0.0–0.2)

## 2022-12-03 LAB — COMPREHENSIVE METABOLIC PANEL
ALT: 46 U/L — ABNORMAL HIGH (ref 0–44)
AST: 42 U/L — ABNORMAL HIGH (ref 15–41)
Albumin: 2.7 g/dL — ABNORMAL LOW (ref 3.5–5.0)
Alkaline Phosphatase: 67 U/L (ref 38–126)
Anion gap: 8 (ref 5–15)
BUN: 19 mg/dL (ref 8–23)
CO2: 29 mmol/L (ref 22–32)
Calcium: 8.5 mg/dL — ABNORMAL LOW (ref 8.9–10.3)
Chloride: 97 mmol/L — ABNORMAL LOW (ref 98–111)
Creatinine, Ser: 0.72 mg/dL (ref 0.44–1.00)
GFR, Estimated: >60 mL/min (ref >60)
Glucose, Bld: 88 mg/dL (ref 70–99)
Potassium: 3.6 mmol/L (ref 3.5–5.1)
Sodium: 134 mmol/L — ABNORMAL LOW (ref 135–145)
Total Bilirubin: 0.4 mg/dL (ref 0.3–1.2)
Total Protein: 6.3 g/dL — ABNORMAL LOW (ref 6.5–8.1)

## 2022-12-03 LAB — STREP PNEUMONIAE URINARY ANTIGEN: Strep Pneumo Urinary Antigen: NEGATIVE

## 2022-12-03 MED ORDER — AZITHROMYCIN 250 MG PO TABS
500.0000 mg | ORAL_TABLET | Freq: Every day | ORAL | Status: AC
  Administered 2022-12-04: 500 mg via ORAL
  Filled 2022-12-03: qty 2

## 2022-12-03 MED ORDER — FUROSEMIDE 10 MG/ML IJ SOLN
40.0000 mg | Freq: Once | INTRAMUSCULAR | Status: AC
  Administered 2022-12-03: 40 mg via INTRAVENOUS
  Filled 2022-12-03: qty 4

## 2022-12-03 MED ORDER — ALPRAZOLAM 0.25 MG PO TABS
0.2500 mg | ORAL_TABLET | Freq: Two times a day (BID) | ORAL | Status: DC | PRN
  Administered 2022-12-03 – 2022-12-08 (×5): 0.25 mg via ORAL
  Filled 2022-12-03 (×5): qty 1

## 2022-12-03 MED ORDER — SODIUM CHLORIDE 0.9 % IV SOLN
2.0000 g | Freq: Two times a day (BID) | INTRAVENOUS | Status: AC
  Administered 2022-12-03 – 2022-12-06 (×7): 2 g via INTRAVENOUS
  Filled 2022-12-03 (×7): qty 12.5

## 2022-12-03 MED ORDER — MORPHINE SULFATE (PF) 2 MG/ML IV SOLN
1.0000 mg | Freq: Once | INTRAVENOUS | Status: AC
  Administered 2022-12-03: 1 mg via INTRAVENOUS
  Filled 2022-12-03: qty 1

## 2022-12-03 MED ORDER — ALPRAZOLAM 0.25 MG PO TABS: 0.2500 mg | ORAL_TABLET | Freq: Once | ORAL | Status: DC

## 2022-12-03 MED ORDER — IOHEXOL 350 MG/ML SOLN
75.0000 mL | Freq: Once | INTRAVENOUS | Status: AC | PRN
  Administered 2022-12-03: 75 mL via INTRAVENOUS

## 2022-12-03 MED ORDER — PREDNISONE 20 MG PO TABS
40.0000 mg | ORAL_TABLET | Freq: Every day | ORAL | Status: AC
  Administered 2022-12-03 – 2022-12-05 (×3): 40 mg via ORAL
  Filled 2022-12-03 (×3): qty 2

## 2022-12-03 NOTE — Progress Notes (Signed)
Pharmacy Antibiotic Note  Kristi Clark is a 72 y.o. female admitted on 11/30/2022 with pneumonia.  Pharmacy has been consulted for cefepime dosing.  Plan: Cefepime 2 grams IV every 12 hours  Height: 4\' 11"  (149.9 cm) Weight: 51.7 kg (114 lb) IBW/kg (Calculated) : 43.2  Temp (24hrs), Avg:98.7 F (37.1 C), Min:98 F (36.7 C), Max:99.6 F (37.6 C)  Recent Labs  Lab 11/30/22 1003 11/30/22 1447 11/30/22 1916 12/01/22 0627 12/02/22 0355 12/03/22 0426  WBC 14.2*  --   --  14.8* 13.7* 11.7*  CREATININE 1.02*  --   --  0.69 0.63 0.72  LATICACIDVEN  --  3.9* 4.5*  --  0.6  --     Estimated Creatinine Clearance: 42.7 mL/min (by C-G formula based on SCr of 0.72 mg/dL).    Allergies  Allergen Reactions   Clindamycin/Lincomycin Hives   Prednisone Other (See Comments)    Psychosis   Amoxicillin Rash   Avelox [Moxifloxacin Hcl In Nacl] Rash   Codeine Sulfate Nausea Only   Penicillins Rash   Sulfa Antibiotics Rash    Antimicrobials this admission: ceftriaxone 9/26 >> 9/28 azithromycin 9/26 >> 9/30  Dose adjustments this admission: N/a  Microbiology results: 9/25 BCx: No growth x 3 days 9/28 MRSA PCR: Not detected  Thank you for allowing pharmacy to be a part of this patient's care.  Elliot Gurney, PharmD, BCPS Clinical Pharmacist  12/03/2022 3:01 PM

## 2022-12-03 NOTE — Plan of Care (Signed)

## 2022-12-03 NOTE — Progress Notes (Addendum)
Progress Note    Kristi Clark  HYQ:[id number] DOB: 04/24/1950  DOA: 11/30/2022 PCP: Dorcas Carrow, DO      Brief Narrative:    Medical records reviewed and are as summarized below:  Kristi Clark is a 72 y.o. 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  She also complained of subjective fever and sweating at night.      Assessment/Plan:   Principal Problem:   Septic shock (HCC) Active Problems:   CAP (community acquired pneumonia)   Pleural effusion on right   COPD with acute exacerbation (HCC)   Chronic respiratory failure with hypoxia (HCC)   AKI (acute kidney injury) (HCC)   Essential hypertension   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 (HCC)   Paroxysmal A-fib (HCC)   History of CAD (coronary artery disease)   Elevated LFTs    Body mass index is 23.03 kg/m.   Septic shock secondary to community-acquired multifocal pneumonia: Respiratory rate up to 27, heart rate up to 96, WBC 14.8 and lactate 4.5. She is not improving.  Change IV ceftriaxone to IV cefepime.  Continue azithromycin. CTA chest has been ordered to rule out acute pulmonary embolism.   Small right-sided pleural effusion: No indication for thoracentesis at this time.   Advanced COPD with exacerbation: Continue bronchodilators.  Restart prednisone.   Acute on chronic hypoxemic respiratory failure: She is on 4 L/min oxygen via [place].  She uses 3 L/min at baseline.   Chronic diastolic CHF: She had 2 doses of IV Lasix thus far without any improvement.  Doubt CHF exacerbation at this time.    Anxiety: Xanax as needed for anxiety.   Paroxysmal atrial fibrillation: Continue amiodarone and metoprolol.   AKI: Improved.   Monitor BMP.   Elevated liver enzymes: May be due to sepsis.  Liver enzymes are better.  She also has history of elevated liver enzymes.  Of note patient is status post cholecystectomy.  Monitor liver enzymes.   CAD: Continue aspirin, Plavix and statin   Chronic anemia: H&H stable.   Diet Order             Diet regular Room service appropriate? Yes; Fluid consistency: Thin  Diet effective now                            Consultants: None  Procedures: None    Medications:    amiodarone  200 mg Oral Daily   aspirin EC  81 mg Oral Daily   atorvastatin  40 mg Oral Q supper   budesonide (PULMICORT) nebulizer solution  0.25 mg Nebulization BID   clopidogrel  75 mg Oral Daily   enoxaparin (LOVENOX) injection  40 mg Subcutaneous Q24H   feeding supplement  237 mL Oral BID BM   ferrous sulfate  325 mg Oral Q breakfast   hydrALAZINE  25 mg Oral BID   ipratropium-albuterol  3 mL Nebulization TID   isosorbide mononitrate  60 mg Oral Daily   metoprolol succinate  50 mg Oral Daily   mirtazapine  45 mg Oral QHS   pantoprazole  40 mg Oral BID   QUEtiapine  100 mg Oral QHS   Continuous Infusions:  azithromycin 500 mg (12/02/22 1121)   cefTRIAXone (ROCEPHIN)  IV 2 g (12/03/22 0911)     Anti-infectives (From admission, onward)    Start     Dose/Rate Route Frequency Ordered Stop   12/01/22 1000  cefTRIAXone (ROCEPHIN) 2 g in sodium chloride 0.9 % 100 mL IVPB        2 g 200 mL/hr over 30 Minutes Intravenous Every 24 hours 11/30/22 1505 12/06/22 0959   12/01/22 1000  azithromycin (ZITHROMAX) 500 mg in sodium chloride 0.9 % 250 mL IVPB        500 mg 250 mL/hr over 60 Minutes Intravenous Every 24 hours 11/30/22 1505 12/06/22 0959   11/30/22 1600  metroNIDAZOLE (FLAGYL) IVPB 500 mg  Status:  Discontinued        500 mg 100 mL/hr over 60 Minutes Intravenous Every 12 hours 11/30/22 1512 11/30/22 1753   11/30/22 1315  cefTRIAXone (ROCEPHIN) 2 g in sodium chloride 0.9 %  100 mL IVPB        2 g 200 mL/hr over 30 Minutes Intravenous  Once 11/30/22 1311 11/30/22 1448   11/30/22 1315  azithromycin (ZITHROMAX) 500 mg in sodium chloride 0.9 % 250 mL IVPB        500 mg 250 mL/hr over 60 Minutes Intravenous  Once 11/30/22 1311 11/30/22 1700              Family Communication/Anticipated D/C date and plan/Code Status   DVT prophylaxis: enoxaparin (LOVENOX) injection 40 mg Start: 11/30/22 2200     Code Status: Limited: Do not attempt resuscitation (DNR) -DNR-LIMITED -Do Not Intubate/DNI   Family Communication: Plan discussed with [name], daughter, and granddaughter at the bedside. Disposition Plan: Plan to discharge home   Status is: Inpatient Remains inpatient appropriate because: Septic shock from pneumonia       Subjective:   Interval events noted.  She complains of shortness of breath, chest tightness and cough.  No chest pain.  She feels she is not improving.  She also complains of anxiety.  She is willing to try Xanax.  [name] (daughter) and granddaughter were at the bedside. Shirlee Limerick, RN, at bedside  Objective:    Vitals:   12/02/22 2054 12/03/22 0354 12/03/22 0513 12/03/22 0729  BP:  (!) 158/55  (!) 140/59  Pulse:  88  62  Resp:  (!) 24  20  Temp:  98.6 F (37 C)  98.7 F (37.1 C)  TempSrc:  Oral  Oral  SpO2: 94% 96% 94% (!) 85%  Weight:      Height:       No data found.   Intake/Output Summary (Last 24 hours) at 12/03/2022 0919 Last data filed at 12/03/2022 0900 Gross per 24 hour  Intake --  Output 2050 ml  Net -2050 ml   Filed Weights   11/30/22 1111  Weight: 51.7 kg    Exam:  GEN: NAD SKIN: Warm and dry EYES: No pallor or icterus ENT: MMM CV: RRR PULM: Bibasilar rales, no wheezing ABD: soft, ND, NT, +BS CNS: AAO x 3, non focal EXT: No edema or tenderness       Data Reviewed:   I have personally reviewed following labs and imaging studies:  Labs: Labs show the following:   Basic Metabolic  Panel: Recent Labs  Lab 11/30/22 1003 12/01/22 0627 12/02/22 0355 12/03/22 0426  NA 132* 131* 131* 134*  K 3.2* 3.8 3.9 3.6  CL 97* 98 96* 97*  CO2 26 25 29 29   GLUCOSE 97 115* 124* 88  BUN 21 19 21  19  CREATININE 1.02* 0.69 0.63 0.72  CALCIUM 8.0* 8.4* 8.5* 8.5*   GFR Estimated Creatinine Clearance: 42.7 mL/min (by C-G formula based on SCr of 0.72 mg/dL). Liver Function Tests: Recent Labs  Lab 11/30/22 1205 12/01/22 0627 12/02/22 0355 12/03/22 0426  AST 60* 64* 43* 42*  ALT 47* 55* 47* 46*  ALKPHOS 76 64 56 67  BILITOT 0.6 0.3 0.4 0.4  PROT 6.1* 6.1* 5.8* 6.3*  ALBUMIN 2.8* 2.6* 2.5* 2.7*   No results for input(s): "LIPASE", "AMYLASE" in the last 168 hours. No results for input(s): "AMMONIA" in the last 168 hours. Coagulation profile No results for input(s): "INR", "PROTIME" in the last 168 hours.  CBC: Recent Labs  Lab 11/30/22 1003 12/01/22 0627 12/02/22 0355 12/03/22 0426  WBC 14.2* 14.8* 13.7* 11.7*  NEUTROABS  --  12.9* 12.1* 9.0*  HGB 8.9* 7.5* 7.5* 8.1*  HCT 27.3* 22.0* 22.6* 24.8*  MCV 98.2 94.4 95.0 95.4  PLT 180 175 190 214   Cardiac Enzymes: No results for input(s): "CKTOTAL", "CKMB", "CKMBINDEX", "TROPONINI" in the last 168 hours. BNP (last 3 results) No results for input(s): "PROBNP" in the last 8760 hours. CBG: No results for input(s): "GLUCAP" in the last 168 hours. D-Dimer: No results for input(s): "DDIMER" in the last 72 hours. Hgb A1c: No results for input(s): "HGBA1C" in the last 72 hours. Lipid Profile: No results for input(s): "CHOL", "HDL", "LDLCALC", "TRIG", "CHOLHDL", "LDLDIRECT" in the last 72 hours. Thyroid function studies: No results for input(s): "TSH", "T4TOTAL", "T3FREE", "THYROIDAB" in the last 72 hours.  Invalid input(s): "FREET3" Anemia work up: No results for input(s): "VITAMINB12", "FOLATE", "FERRITIN", "TIBC", "IRON", "RETICCTPCT" in the last 72 hours. Sepsis Labs: Recent Labs  Lab 11/30/22 1003  11/30/22 1205 11/30/22 1447 11/30/22 1916 12/01/22 0627 12/02/22 0355 12/03/22 0426  PROCALCITON  --  2.63  --   --   --  2.59  --   WBC 14.2*  --   --   --  14.8* 13.7* 11.7*  LATICACIDVEN  --   --  3.9* 4.5*  --  0.6  --     Microbiology Recent Results (from the past 240 hour(s))  SARS Coronavirus 2 by RT PCR (hospital order, performed in [facility] hospital lab) *cepheid single result test* Anterior Nasal Swab     Status: None   Collection Time: 11/30/22 12:05 PM   Specimen: Anterior Nasal Swab  Result Value Ref Range Status   SARS Coronavirus 2 by RT PCR NEGATIVE NEGATIVE Final    Comment: (NOTE) SARS-CoV-2 target nucleic acids are NOT DETECTED.  The SARS-CoV-2 RNA is generally detectable in upper and lower respiratory specimens during the acute phase of infection. The lowest concentration of SARS-CoV-2 viral copies this assay can detect is 250 copies / mL. A negative result does not preclude SARS-CoV-2 infection and should not be used as the sole basis for treatment or other patient management decisions.  A negative result may occur with improper specimen collection / handling, submission of specimen other than nasopharyngeal swab, presence of viral mutation(s) within the areas targeted by this assay, and inadequate number of viral copies (<250 copies / mL). A negative result must be combined with clinical observations, patient history, and epidemiological information.  Fact Sheet for Patients:   RoadLapTop.co.za  Fact Sheet for Healthcare Providers: http://kim-miller.com/  This test is not yet approved or  cleared by the [place] FDA and has been authorized for detection and/or diagnosis of SARS-CoV-2 by FDA under an Emergency Use Authorization (  EUA).  This EUA will remain in effect (meaning this test can be used) for the duration of the COVID-19 declaration under Section 564(b)(1) of the Act, 21 U.S.C. section  360bbb-3(b)(1), unless the authorization is terminated or revoked sooner.  Performed at [facility], [address] [place], [place] [address]   Blood culture (routine x 2)     Status: None (Preliminary result)   Collection Time: 11/30/22 12:58 PM   Specimen: BLOOD  Result Value Ref Range Status   Specimen Description BLOOD BLOOD RIGHT HAND  Final   Special Requests   Final    BOTTLES DRAWN AEROBIC AND ANAEROBIC Blood Culture results may not be optimal due to an excessive volume of blood received in culture bottles   Culture   Final    NO GROWTH 3 DAYS Performed at [facility], [address] [place], [place] [address]    Report Status PENDING  Incomplete  Blood culture (routine x 2)     Status: None (Preliminary result)   Collection Time: 11/30/22  1:06 PM   Specimen: BLOOD  Result Value Ref Range Status   Specimen Description BLOOD BLOOD RIGHT HAND  Final   Special Requests   Final    BOTTLES DRAWN AEROBIC AND ANAEROBIC Blood Culture adequate volume   Culture   Final    NO GROWTH 3 DAYS Performed at [facility], [address], [place], [place] [address]    Report Status PENDING  Incomplete    Procedures and diagnostic studies:  DG Chest Port 1 View  Result Date: 12/02/2022 CLINICAL DATA:  Hypoxia. Worsening shortness of breath today. Admitted for septic shock 2 days ago. EXAM: PORTABLE CHEST 1 VIEW COMPARISON:  Radiographs 11/30/2022, 04/18/2022 and 03/04/2022. Chest CT 11/30/2022. FINDINGS: 1748 hours. The heart size and mediastinal contours are stable with aortic atherosclerosis. There has been partial clearing of the dense airspace disease peripherally in the right upper lobe seen on the most recent radiographs. Coarse interstitial opacities throughout the lungs are grossly unchanged. There may be small bilateral pleural effusions. No pneumothorax. The bones appear unchanged. IMPRESSION: Interval partial clearing of right upper lobe  pneumonia. Possible new small bilateral pleural effusions. Otherwise stable appearance of the chest with coarse interstitial opacities and underlying emphysema. Electronically Signed   By: Carey Bullocks M.D.   On: 12/02/2022 19:07               LOS: 3 days   Efrain Clauson  [facility]   Pager on [email or web address]. If 7PM-7AM, please contact night-coverage at [email or web address]     12/03/2022, 9:19 AM

## 2022-12-03 NOTE — Progress Notes (Signed)
CROSS COVER NOTE  NAME: LAQUIESHA VANDENBURGH MRN: [id number] DOB : 06-27-1950    Concern as stated by nurse / staff   increased work of breathing and increase oxygen requirement 5L     Pertinent findings on chart review: Dg chest yesterday evening 1751PM IMPRESSION: Interval partial clearing of right upper lobe pneumonia. Possible new small bilateral pleural effusions. Otherwise stable appearance of the chest with coarse interstitial opacities and underlying emphysema.  Assessment and  Interventions   Assessment: Bedside patient endorses increased shortness of bereath and inability to get comfort. Unable to lay back    12/03/2022    3:54 AM 12/02/2022    8:21 PM 12/02/2022    4:12 PM  Vitals with BMI  Systolic 158 170 829  Diastolic 55 55 48  Pulse 88 92 88    l Plan: Lasix 40 mg x1 Bladder scan every 6-8 h prn no void an d 2h after lasix dose In and out cath prn bladder volume above 400 Morphine 1 mg x1      Donnie Mesa NP [facility] Cross Cover 7pm-7am - check amion for availability Pager [phone]

## 2022-12-04 DIAGNOSIS — R6521 Severe sepsis with septic shock: Secondary | ICD-10-CM | POA: Diagnosis not present

## 2022-12-04 DIAGNOSIS — A419 Sepsis, unspecified organism: Secondary | ICD-10-CM | POA: Diagnosis not present

## 2022-12-04 LAB — IRON AND TIBC
Iron: 21 ug/dL — ABNORMAL LOW (ref 28–170)
Saturation Ratios: 11 % (ref 10.4–31.8)
TIBC: 189 ug/dL — ABNORMAL LOW (ref 250–450)
UIBC: 168 ug/dL

## 2022-12-04 LAB — CBC
HCT: 20.2 % — ABNORMAL LOW (ref 36.0–46.0)
Hemoglobin: 6.9 g/dL — ABNORMAL LOW (ref 12.0–15.0)
MCH: 31.5 pg (ref 26.0–34.0)
MCHC: 34.2 g/dL (ref 30.0–36.0)
MCV: 92.2 fL (ref 80.0–100.0)
Platelets: 201 10*3/uL (ref 150–400)
RBC: 2.19 MIL/uL — ABNORMAL LOW (ref 3.87–5.11)
RDW: 14.1 % (ref 11.5–15.5)
WBC: 6.8 10*3/uL (ref 4.0–10.5)
nRBC: 0 % (ref 0.0–0.2)

## 2022-12-04 LAB — FERRITIN: Ferritin: 204 ng/mL (ref 11–307)

## 2022-12-04 LAB — BLOOD GAS, ARTERIAL
Acid-Base Excess: 7.9 mmol/L — ABNORMAL HIGH (ref 0.0–2.0)
Bicarbonate: 32.7 mmol/L — ABNORMAL HIGH (ref 20.0–28.0)
FIO2: 0.32 %
O2 Saturation: 91 %
Patient temperature: 37
pCO2 arterial: 47 mm[Hg] (ref 32–48)
pH, Arterial: 7.45 (ref 7.35–7.45)
pO2, Arterial: 56 mm[Hg] — ABNORMAL LOW (ref 83–108)

## 2022-12-04 LAB — BASIC METABOLIC PANEL
Anion gap: 8 (ref 5–15)
BUN: 18 mg/dL (ref 8–23)
CO2: 30 mmol/L (ref 22–32)
Calcium: 8.1 mg/dL — ABNORMAL LOW (ref 8.9–10.3)
Chloride: 94 mmol/L — ABNORMAL LOW (ref 98–111)
Creatinine, Ser: 0.76 mg/dL (ref 0.44–1.00)
GFR, Estimated: >60 mL/min (ref >60)
Glucose, Bld: 127 mg/dL — ABNORMAL HIGH (ref 70–99)
Potassium: 3.5 mmol/L (ref 3.5–5.1)
Sodium: 132 mmol/L — ABNORMAL LOW (ref 135–145)

## 2022-12-04 LAB — HEMOGLOBIN: Hemoglobin: 7 g/dL — ABNORMAL LOW (ref 12.0–15.0)

## 2022-12-04 LAB — PREPARE RBC (CROSSMATCH)

## 2022-12-04 LAB — BRAIN NATRIURETIC PEPTIDE: B Natriuretic Peptide: 591.6 pg/mL — ABNORMAL HIGH (ref 0.0–100.0)

## 2022-12-04 MED ORDER — FUROSEMIDE 10 MG/ML IJ SOLN
20.0000 mg | Freq: Once | INTRAMUSCULAR | Status: AC
  Administered 2022-12-04: 20 mg via INTRAVENOUS
  Filled 2022-12-04: qty 4

## 2022-12-04 MED ORDER — MORPHINE SULFATE (PF) 2 MG/ML IV SOLN
2.0000 mg | Freq: Once | INTRAVENOUS | Status: AC
  Administered 2022-12-04: 2 mg via INTRAVENOUS
  Filled 2022-12-04: qty 1

## 2022-12-04 MED ORDER — SODIUM CHLORIDE 0.9% IV SOLUTION: Freq: Once | INTRAVENOUS | Status: DC

## 2022-12-04 MED ORDER — FUROSEMIDE 10 MG/ML IJ SOLN
20.0000 mg | Freq: Once | INTRAMUSCULAR | Status: AC
  Administered 2022-12-04: 20 mg via INTRAVENOUS

## 2022-12-04 NOTE — Progress Notes (Signed)
Progress Note    Kristi Clark  WJX:[id number] DOB: 1950/05/25  DOA: 11/30/2022 PCP: Kristi Carrow, DO      Brief Narrative:    Medical records reviewed and are as summarized below:  Kristi Clark is a 71 y.o. 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  She also complained of subjective fever and sweating at night.      Assessment/Plan:   Principal Problem:   Septic shock (HCC) Active Problems:   CAP (community acquired pneumonia)   Pleural effusion on right   COPD with acute exacerbation (HCC)   Chronic respiratory failure with hypoxia (HCC)   AKI (acute kidney injury) (HCC)   Essential hypertension   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 (HCC)   Paroxysmal A-fib (HCC)   History of CAD (coronary artery disease)   Elevated LFTs    Body mass index is 23.03 kg/m.   Septic shock secondary to community-acquired multifocal pneumonia: Continue IV cefepime and oral azithromycin. CTA chest did not show any evidence of pulmonary embolism.   Small bilateral pleural effusion: No indication for thoracentesis at this time.   Advanced COPD with exacerbation: Continue prednisone and bronchodilators   Severe anemia, acute on chronic anemia: Hemoglobin dropped from 8.1-6.9.  No evidence of overt blood loss at this time.  Transfuse 1 unit of packed red blood cells.  Risks and benefits of blood transfusion were discussed with the patient and she is agreeable with the plan.   Acute on chronic hypoxemic respiratory failure: She is on 3 L/min oxygen via [place].  ABG showed pH 7.45, pCO2 47, pO2 56, O2 sat 91% on FiO2 of 32%   Chronic diastolic CHF: S/p treatment with IV Lasix.  Doubt CHF exacerbation at this  time.   BNP 591.6 but patient has had chronically elevated BNP for some time now.   Acute urinary retention: In and out urethral catheterization was done for urine volume of 510 mls   Anxiety: Xanax as needed for anxiety.   Paroxysmal atrial fibrillation: Continue amiodarone and metoprolol.   AKI: Improved.  Monitor BMP.   Elevated liver enzymes: May be due to sepsis.  Liver enzymes are better.  She also has history of elevated liver enzymes.  Of note patient is status post cholecystectomy.  Monitor liver enzymes.   CAD: Continue aspirin, Plavix and statin   Chronic anemia: H&H stable.   Diet Order             Diet regular Room service appropriate? Yes; Fluid consistency: Thin  Diet effective now                            Consultants: None  Procedures: None    Medications:    sodium chloride   Intravenous Once   amiodarone  200 mg Oral Daily   aspirin EC  81 mg Oral Daily   atorvastatin  40 mg Oral Q supper   budesonide (PULMICORT) nebulizer solution  0.25 mg Nebulization BID   clopidogrel  75 mg Oral Daily   enoxaparin (LOVENOX) injection  40 mg Subcutaneous Q24H   feeding supplement  237 mL Oral BID BM   ferrous sulfate  325 mg Oral Q breakfast   hydrALAZINE  25 mg  Oral BID   ipratropium-albuterol  3 mL Nebulization TID   isosorbide mononitrate  60 mg Oral Daily   metoprolol succinate  50 mg Oral Daily   mirtazapine  45 mg Oral QHS   pantoprazole  40 mg Oral BID   predniSONE  40 mg Oral Q breakfast   QUEtiapine  100 mg Oral QHS   Continuous Infusions:  ceFEPime (MAXIPIME) IV 2 g (12/04/22 0408)     Anti-infectives (From admission, onward)    Start     Dose/Rate Route Frequency Ordered Stop   12/04/22 1000  azithromycin (ZITHROMAX) tablet 500 mg        500 mg Oral Daily 12/03/22 1100 12/04/22 0925   12/03/22 1600  ceFEPIme (MAXIPIME) 2 g in sodium chloride 0.9 % 100 mL IVPB        2 g 200 mL/hr over 30 Minutes Intravenous Every  12 hours 12/03/22 1501     12/01/22 1000  cefTRIAXone (ROCEPHIN) 2 g in sodium chloride 0.9 % 100 mL IVPB  Status:  Discontinued        2 g 200 mL/hr over 30 Minutes Intravenous Every 24 hours 11/30/22 1505 12/03/22 1417   12/01/22 1000  azithromycin (ZITHROMAX) 500 mg in sodium chloride 0.9 % 250 mL IVPB        500 mg 250 mL/hr over 60 Minutes Intravenous Every 24 hours 11/30/22 1505 12/03/22 1215   11/30/22 1600  metroNIDAZOLE (FLAGYL) IVPB 500 mg  Status:  Discontinued        500 mg 100 mL/hr over 60 Minutes Intravenous Every 12 hours 11/30/22 1512 11/30/22 1753   11/30/22 1315  cefTRIAXone (ROCEPHIN) 2 g in sodium chloride 0.9 % 100 mL IVPB        2 g 200 mL/hr over 30 Minutes Intravenous  Once 11/30/22 1311 11/30/22 1448   11/30/22 1315  azithromycin (ZITHROMAX) 500 mg in sodium chloride 0.9 % 250 mL IVPB        500 mg 250 mL/hr over 60 Minutes Intravenous  Once 11/30/22 1311 11/30/22 1700              Family Communication/Anticipated D/C date and plan/Code Status   DVT prophylaxis: enoxaparin (LOVENOX) injection 40 mg Start: 11/30/22 2200     Code Status: Limited: Do not attempt resuscitation (DNR) -DNR-LIMITED -Do Not Intubate/DNI   Family Communication: None Disposition Plan: Plan to discharge home   Status is: Inpatient Remains inpatient appropriate because: Septic shock from pneumonia       Subjective:   Interval events noted.  She complains of shortness of breath, difficulty urination and lower abdominal pain.  Bladder scan at the bedside showed 510 urine volume (done by nursing assistant)  Objective:    Vitals:   12/04/22 0434 12/04/22 0932 12/04/22 1137 12/04/22 1212  BP: (!) 149/48 (!) 153/51 (!) 142/40 (!) 142/54  Pulse: 78 88 80 84  Resp: 18 18 20 18   Temp: 98 F (36.7 C) 97.7 F (36.5 C) 98.2 F (36.8 C) 97.9 F (36.6 C)  TempSrc:  Oral Oral Oral  SpO2: 98% 94% 93% 94%  Weight:      Height:       No data found.   Intake/Output  Summary (Last 24 hours) at 12/04/2022 1449 Last data filed at 12/04/2022 1414 Gross per 24 hour  Intake 460 ml  Output 3370 ml  Net -2910 ml   Filed Weights   11/30/22 1111  Weight: 51.7 kg    Exam:  GEN:  Mild respiratory distress SKIN: Warm and dry EYES: No pallor or icterus ENT: MMM CV: RRR PULM: Bibasilar rales, bilateral wheezing ABD: soft, ND, NT, +BS CNS: AAO x 3, non focal EXT: No edema or tenderness      Data Reviewed:   I have personally reviewed following labs and imaging studies:  Labs: Labs show the following:   Basic Metabolic Panel: Recent Labs  Lab 11/30/22 1003 12/01/22 0627 12/02/22 0355 12/03/22 0426 12/04/22 0346  NA 132* 131* 131* 134* 132*  K 3.2* 3.8 3.9 3.6 3.5  CL 97* 98 96* 97* 94*  CO2 26 25 29 29 30   GLUCOSE 97 115* 124* 88 127*  BUN 21 19 21 19 18   CREATININE 1.02* 0.69 0.63 0.72 0.76  CALCIUM 8.0* 8.4* 8.5* 8.5* 8.1*   GFR Estimated Creatinine Clearance: 42.1 mL/min (by C-G formula based on SCr of 0.76 mg/dL). Liver Function Tests: Recent Labs  Lab 11/30/22 1205 12/01/22 0627 12/02/22 0355 12/03/22 0426  AST 60* 64* 43* 42*  ALT 47* 55* 47* 46*  ALKPHOS 76 64 56 67  BILITOT 0.6 0.3 0.4 0.4  PROT 6.1* 6.1* 5.8* 6.3*  ALBUMIN 2.8* 2.6* 2.5* 2.7*   No results for input(s): "LIPASE", "AMYLASE" in the last 168 hours. No results for input(s): "AMMONIA" in the last 168 hours. Coagulation profile No results for input(s): "INR", "PROTIME" in the last 168 hours.  CBC: Recent Labs  Lab 11/30/22 1003 12/01/22 0627 12/02/22 0355 12/03/22 0426 12/04/22 0346 12/04/22 0651  WBC 14.2* 14.8* 13.7* 11.7* 6.8  --   NEUTROABS  --  12.9* 12.1* 9.0*  --   --   HGB 8.9* 7.5* 7.5* 8.1* 6.9* 7.0*  HCT 27.3* 22.0* 22.6* 24.8* 20.2*  --   MCV 98.2 94.4 95.0 95.4 92.2  --   PLT 180 175 190 214 201  --    Cardiac Enzymes: No results for input(s): "CKTOTAL", "CKMB", "CKMBINDEX", "TROPONINI" in the last 168 hours. BNP (last 3  results) No results for input(s): "PROBNP" in the last 8760 hours. CBG: No results for input(s): "GLUCAP" in the last 168 hours. D-Dimer: No results for input(s): "DDIMER" in the last 72 hours. Hgb A1c: No results for input(s): "HGBA1C" in the last 72 hours. Lipid Profile: No results for input(s): "CHOL", "HDL", "LDLCALC", "TRIG", "CHOLHDL", "LDLDIRECT" in the last 72 hours. Thyroid function studies: No results for input(s): "TSH", "T4TOTAL", "T3FREE", "THYROIDAB" in the last 72 hours.  Invalid input(s): "FREET3" Anemia work up: Recent Labs    12/04/22 0345  FERRITIN 204  TIBC 189*  IRON 21*   Sepsis Labs: Recent Labs  Lab 11/30/22 1205 11/30/22 1447 11/30/22 1916 12/01/22 0627 12/02/22 0355 12/03/22 0426 12/04/22 0346  PROCALCITON 2.63  --   --   --  2.59  --   --   WBC  --   --   --  14.8* 13.7* 11.7* 6.8  LATICACIDVEN  --  3.9* 4.5*  --  0.6  --   --     Microbiology Recent Results (from the past 240 hour(s))  SARS Coronavirus 2 by RT PCR (hospital order, performed in [facility] hospital lab) *cepheid single result test* Anterior Nasal Swab     Status: None   Collection Time: 11/30/22 12:05 PM   Specimen: Anterior Nasal Swab  Result Value Ref Range Status   SARS Coronavirus 2 by RT PCR NEGATIVE NEGATIVE Final    Comment: (NOTE) SARS-CoV-2 target nucleic acids are NOT DETECTED.  The SARS-CoV-2 RNA  is generally detectable in upper and lower respiratory specimens during the acute phase of infection. The lowest concentration of SARS-CoV-2 viral copies this assay can detect is 250 copies / mL. A negative result does not preclude SARS-CoV-2 infection and should not be used as the sole basis for treatment or other patient management decisions.  A negative result may occur with improper specimen collection / handling, submission of specimen other than nasopharyngeal swab, presence of viral mutation(s) within the areas targeted by this assay, and inadequate number of  viral copies (<250 copies / mL). A negative result must be combined with clinical observations, patient history, and epidemiological information.  Fact Sheet for Patients:   RoadLapTop.co.za  Fact Sheet for Healthcare Providers: http://kim-miller.com/  This test is not yet approved or  cleared by the [place] FDA and has been authorized for detection and/or diagnosis of SARS-CoV-2 by FDA under an Emergency Use Authorization (EUA).  This EUA will remain in effect (meaning this test can be used) for the duration of the COVID-19 declaration under Section 564(b)(1) of the Act, 21 U.S.C. section 360bbb-3(b)(1), unless the authorization is terminated or revoked sooner.  Performed at [facility], [address] [place], [place] [address]   Blood culture (routine x 2)     Status: None (Preliminary result)   Collection Time: 11/30/22 12:58 PM   Specimen: BLOOD  Result Value Ref Range Status   Specimen Description BLOOD BLOOD RIGHT HAND  Final   Special Requests   Final    BOTTLES DRAWN AEROBIC AND ANAEROBIC Blood Culture results may not be optimal due to an excessive volume of blood received in culture bottles   Culture   Final    NO GROWTH 4 DAYS Performed at [facility], [address], [place], [place] [address]    Report Status PENDING  Incomplete  Blood culture (routine x 2)     Status: None (Preliminary result)   Collection Time: 11/30/22  1:06 PM   Specimen: BLOOD  Result Value Ref Range Status   Specimen Description BLOOD BLOOD RIGHT HAND  Final   Special Requests   Final    BOTTLES DRAWN AEROBIC AND ANAEROBIC Blood Culture adequate volume   Culture   Final    NO GROWTH 4 DAYS Performed at [facility], [address] [place], [place] [address]    Report Status PENDING  Incomplete  MRSA Next Gen by PCR, Nasal     Status: None   Collection Time: 12/03/22  4:15 AM   Specimen: Nasal Mucosa;  Nasal Swab  Result Value Ref Range Status   MRSA by PCR Next Gen NOT DETECTED NOT DETECTED Final    Comment: (NOTE) The GeneXpert MRSA Assay (FDA approved for NASAL specimens only), is one component of a comprehensive MRSA colonization surveillance program. It is not intended to diagnose MRSA infection nor to guide or monitor treatment for MRSA infections. Test performance is not FDA approved in patients less than 2 years old. Performed at [facility], [address] [place], [place] [address]     Procedures and diagnostic studies:  CT Angio Chest Pulmonary Embolism (PE) W or WO Contrast  Result Date: 12/03/2022 CLINICAL DATA:  PE suspected EXAM: CT ANGIOGRAPHY CHEST WITH CONTRAST TECHNIQUE: Multidetector CT imaging of the chest was performed using the standard protocol during bolus administration of intravenous contrast. Multiplanar CT image reconstructions and MIPs were obtained to evaluate the vascular anatomy. RADIATION DOSE REDUCTION: This exam was performed according to  the departmental dose-optimization program which includes automated exposure control, adjustment of the mA and/or kV according to patient size and/or use of iterative reconstruction technique. CONTRAST:  75mL OMNIPAQUE IOHEXOL 350 MG/ML SOLN COMPARISON:  11/30/2022 FINDINGS: Cardiovascular: Satisfactory opacification of the pulmonary arteries to the segmental level. No evidence of pulmonary embolism. Cardiomegaly. Three-vessel coronary artery calcifications and stents. Trace pericardial effusion. Severe aortic atherosclerosis. Mediastinum/Nodes: Numerous unchanged enlarged mediastinal and hilar lymph nodes. Thyroid gland, trachea, and esophagus demonstrate no significant findings. Lungs/Pleura: Severe emphysema. Diffuse bilateral bronchial wall thickening. Small bilateral pleural effusions. Interlobular septal thickening throughout and heterogeneous airspace consolidation involving the posterior and inferior right  upper lobe (series 7, image 64) Upper Abdomen: No acute abnormality. Musculoskeletal: No chest wall abnormality. No acute osseous findings. Review of the MIP images confirms the above findings. IMPRESSION: 1. Negative examination for pulmonary embolism. 2. Heterogeneous airspace consolidation involving the posterior and inferior right upper lobe, consistent with infection. 3. Interlobular septal thickening throughout the lungs and small bilateral pleural effusions. 4. Severe emphysema and diffuse bilateral bronchial wall thickening. 5. Numerous unchanged enlarged mediastinal and hilar lymph nodes, likely reactive 6. Cardiomegaly and coronary artery disease. Aortic Atherosclerosis (ICD10-I70.0) and Emphysema (ICD10-J43.9). Electronically Signed   By: Jearld Lesch M.D.   On: 12/03/2022 14:39   DG Chest Port 1 View  Result Date: 12/02/2022 CLINICAL DATA:  Hypoxia. Worsening shortness of breath today. Admitted for septic shock 2 days ago. EXAM: PORTABLE CHEST 1 VIEW COMPARISON:  Radiographs 11/30/2022, 04/18/2022 and 03/04/2022. Chest CT 11/30/2022. FINDINGS: 1748 hours. The heart size and mediastinal contours are stable with aortic atherosclerosis. There has been partial clearing of the dense airspace disease peripherally in the right upper lobe seen on the most recent radiographs. Coarse interstitial opacities throughout the lungs are grossly unchanged. There may be small bilateral pleural effusions. No pneumothorax. The bones appear unchanged. IMPRESSION: Interval partial clearing of right upper lobe pneumonia. Possible new small bilateral pleural effusions. Otherwise stable appearance of the chest with coarse interstitial opacities and underlying emphysema. Electronically Signed   By: Carey Bullocks M.D.   On: 12/02/2022 19:07               LOS: 4 days   Davianna Deutschman  [facility]   Pager on [email or web address]. If 7PM-7AM, please contact night-coverage at [email or web address]     12/04/2022,  2:49 PM

## 2022-12-04 NOTE — Progress Notes (Signed)
   12/04/22 1300  Spiritual Encounters  Type of Visit Initial  Care provided to: Pt and family  Reason for visit Routine spiritual support  OnCall Visit Yes   Chaplain stopped in to wish patient a happy birthday.

## 2022-12-05 DIAGNOSIS — R6521 Severe sepsis with septic shock: Secondary | ICD-10-CM | POA: Diagnosis not present

## 2022-12-05 DIAGNOSIS — A419 Sepsis, unspecified organism: Secondary | ICD-10-CM | POA: Diagnosis not present

## 2022-12-05 LAB — TYPE AND SCREEN
ABO/RH(D): A NEG
Antibody Screen: NEGATIVE
Unit division: 0

## 2022-12-05 LAB — CBC
HCT: 27.7 % — ABNORMAL LOW (ref 36.0–46.0)
Hemoglobin: 9.6 g/dL — ABNORMAL LOW (ref 12.0–15.0)
MCH: 31.6 pg (ref 26.0–34.0)
MCHC: 34.7 g/dL (ref 30.0–36.0)
MCV: 91.1 fL (ref 80.0–100.0)
Platelets: 217 10*3/uL (ref 150–400)
RBC: 3.04 MIL/uL — ABNORMAL LOW (ref 3.87–5.11)
RDW: 14.9 % (ref 11.5–15.5)
WBC: 8.2 10*3/uL (ref 4.0–10.5)
nRBC: 0.4 % — ABNORMAL HIGH (ref 0.0–0.2)

## 2022-12-05 LAB — COMPREHENSIVE METABOLIC PANEL
ALT: 57 U/L — ABNORMAL HIGH (ref 0–44)
AST: 49 U/L — ABNORMAL HIGH (ref 15–41)
Albumin: 2.5 g/dL — ABNORMAL LOW (ref 3.5–5.0)
Alkaline Phosphatase: 58 U/L (ref 38–126)
Anion gap: 9 (ref 5–15)
BUN: 19 mg/dL (ref 8–23)
CO2: 30 mmol/L (ref 22–32)
Calcium: 8.1 mg/dL — ABNORMAL LOW (ref 8.9–10.3)
Chloride: 91 mmol/L — ABNORMAL LOW (ref 98–111)
Creatinine, Ser: 0.78 mg/dL (ref 0.44–1.00)
GFR, Estimated: >60 mL/min (ref >60)
Glucose, Bld: 128 mg/dL — ABNORMAL HIGH (ref 70–99)
Potassium: 3.5 mmol/L (ref 3.5–5.1)
Sodium: 130 mmol/L — ABNORMAL LOW (ref 135–145)
Total Bilirubin: 0.6 mg/dL (ref 0.3–1.2)
Total Protein: 6 g/dL — ABNORMAL LOW (ref 6.5–8.1)

## 2022-12-05 LAB — CULTURE, BLOOD (ROUTINE X 2)
Culture: NO GROWTH
Culture: NO GROWTH

## 2022-12-05 LAB — BPAM RBC
Blood Product Expiration Date: 202410182359
ISSUE DATE / TIME: 202409291149
Unit Type and Rh: 600

## 2022-12-05 LAB — LEGIONELLA PNEUMOPHILA SEROGP 1 UR AG: L. pneumophila Serogp 1 Ur Ag: NEGATIVE

## 2022-12-05 MED ORDER — MORPHINE SULFATE (PF) 2 MG/ML IV SOLN
2.0000 mg | Freq: Four times a day (QID) | INTRAVENOUS | Status: DC | PRN
  Administered 2022-12-05 – 2022-12-07 (×2): 2 mg via INTRAVENOUS
  Filled 2022-12-05 (×2): qty 1

## 2022-12-05 NOTE — Progress Notes (Addendum)
Progress Note    Kristi Clark  YQM:[id number] DOB: 07-May-1950  DOA: 11/30/2022 PCP: Dorcas Carrow, DO      Brief Narrative:    Medical records reviewed and are as summarized below:  Kristi Clark is a 72 y.o. 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  She also complained of subjective fever and sweating at night.      Assessment/Plan:   Principal Problem:   Septic shock (HCC) Active Problems:   CAP (community acquired pneumonia)   Pleural effusion on right   COPD with acute exacerbation (HCC)   Chronic respiratory failure with hypoxia (HCC)   AKI (acute kidney injury) (HCC)   Essential hypertension   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 (HCC)   Paroxysmal A-fib (HCC)   History of CAD (coronary artery disease)   Elevated LFTs    Body mass index is 23.03 kg/m.   Septic shock secondary to community-acquired multifocal pneumonia: Completed azithromycin.  Plan to complete IV cefepime tomorrow CTA chest did not show any evidence of pulmonary embolism.   Small bilateral pleural effusion: No indication for thoracentesis at this time.   Advanced COPD with exacerbation: Continue prednisone and bronchodilators   Severe anemia, acute on chronic anemia: S/p transfusion with 1 unit of PRBCs on 12/04/2022.  Hemoglobin improved from 6.9 to 9.6.     Acute on chronic hypoxemic respiratory failure: She is on 3 L/min oxygen via .  ABG showed pH 7.45, pCO2 47, pO2 56, O2 sat 91% on FiO2 of 32%   Chronic diastolic CHF: S/p treatment with IV Lasix.  Doubt CHF exacerbation at this time.  Continue oral Lasix for now. BNP 591.6 but patient has had chronically elevated BNP for some time now.   Acute  urinary retention: In and out urethral catheterization as needed.   Anxiety: Xanax as needed for anxiety.   Paroxysmal atrial fibrillation: Continue amiodarone and metoprolol.   AKI: Improved.  Monitor BMP.   Elevated liver enzymes: May be due to sepsis.  Liver enzymes are better.  She also has history of elevated liver enzymes.  Of note patient is status post cholecystectomy.  Monitor liver enzymes.   CAD: Continue aspirin, Plavix and statin   Chronic hyponatremia: Sodium level is stable.   Diagnoses and goals of care were discussed.  Patient is pessimistic and does not think she will make it home.  Discussed hospice with her.  She will think about it.   Diet Order             Diet regular Room service appropriate? Yes; Fluid consistency: Thin  Diet effective now                            Consultants: None  Procedures: None    Medications:    sodium chloride   Intravenous Once   amiodarone  200 mg Oral Daily   aspirin EC  81 mg Oral Daily   atorvastatin  40 mg Oral Q supper   budesonide (PULMICORT) nebulizer solution  0.25 mg Nebulization BID   clopidogrel  75 mg Oral Daily   enoxaparin (LOVENOX) injection  40 mg Subcutaneous Q24H   feeding supplement  237 mL Oral BID BM   ferrous sulfate  325 mg Oral Q breakfast   hydrALAZINE  25 mg Oral BID   ipratropium-albuterol  3 mL Nebulization TID   isosorbide mononitrate  60 mg Oral Daily   metoprolol succinate  50 mg Oral Daily   mirtazapine  45 mg Oral QHS   pantoprazole  40 mg Oral BID   QUEtiapine  100 mg Oral QHS   Continuous Infusions:  ceFEPime (MAXIPIME) IV 2 g (12/05/22 0301)     Anti-infectives (From admission, onward)    Start     Dose/Rate Route Frequency Ordered Stop   12/04/22 1000  azithromycin (ZITHROMAX) tablet 500 mg        500 mg Oral Daily 12/03/22 1100 12/04/22 0925   12/03/22 1600  ceFEPIme (MAXIPIME) 2 g in sodium chloride 0.9 % 100 mL IVPB        2 g 200 mL/hr over  30 Minutes Intravenous Every 12 hours 12/03/22 1501     12/01/22 1000  cefTRIAXone (ROCEPHIN) 2 g in sodium chloride 0.9 % 100 mL IVPB  Status:  Discontinued        2 g 200 mL/hr over 30 Minutes Intravenous Every 24 hours 11/30/22 1505 12/03/22 1417   12/01/22 1000  azithromycin (ZITHROMAX) 500 mg in sodium chloride 0.9 % 250 mL IVPB        500 mg 250 mL/hr over 60 Minutes Intravenous Every 24 hours 11/30/22 1505 12/03/22 1215   11/30/22 1600  metroNIDAZOLE (FLAGYL) IVPB 500 mg  Status:  Discontinued        500 mg 100 mL/hr over 60 Minutes Intravenous Every 12 hours 11/30/22 1512 11/30/22 1753   11/30/22 1315  cefTRIAXone (ROCEPHIN) 2 g in sodium chloride 0.9 % 100 mL IVPB        2 g 200 mL/hr over 30 Minutes Intravenous  Once 11/30/22 1311 11/30/22 1448   11/30/22 1315  azithromycin (ZITHROMAX) 500 mg in sodium chloride 0.9 % 250 mL IVPB        500 mg 250 mL/hr over 60 Minutes Intravenous  Once 11/30/22 1311 11/30/22 1700              Family Communication/Anticipated D/C date and plan/Code Status   DVT prophylaxis: enoxaparin (LOVENOX) injection 40 mg Start: 11/30/22 2200     Code Status: Limited: Do not attempt resuscitation (DNR) -DNR-LIMITED -Do Not Intubate/DNI   Family Communication: Plan discussed with [name], daughter, over the phone Disposition Plan: Plan to discharge home   Status is: Inpatient Remains inpatient appropriate because: Septic shock from pneumonia       Subjective:   Interval events noted.  She complains of trouble breathing.  She still has trouble passing urine.  She sounds very pessimistic and thinks that she might not make it this time.  Objective:    Vitals:   12/04/22 1949 12/04/22 2009 12/05/22 0252 12/05/22 0739  BP: (!) 172/64  (!) 183/64 (!) 177/64  Pulse: 84  76 90  Resp: 18  18 20   Temp: 98.3 F (36.8 C)  98.3 F (36.8 C) 97.8 F (36.6 C)  TempSrc:      SpO2: 97% 97% 98% 100%  Weight:      Height:       No data  found.   Intake/Output Summary (Last 24 hours) at 12/05/2022 1512 Last data filed at 12/05/2022 1410 Gross per 24 hour  Intake 848 ml  Output 3000 ml  Net -2152 ml   Filed Weights   11/30/22 1111  Weight: 51.7 kg  Exam:   GEN: NAD SKIN: Warm and dry EYES: No pallor or icterus ENT: MMM CV: RRR PULM: Bibasilar rales, bilateral wheezing ABD: soft, ND, NT, +BS CNS: AAO x 3, non focal EXT: No edema or tenderness       Data Reviewed:   I have personally reviewed following labs and imaging studies:  Labs: Labs show the following:   Basic Metabolic Panel: Recent Labs  Lab 12/01/22 0627 12/02/22 0355 12/03/22 0426 12/04/22 0346 12/05/22 0258  NA 131* 131* 134* 132* 130*  K 3.8 3.9 3.6 3.5 3.5  CL 98 96* 97* 94* 91*  CO2 25 29 29 30 30   GLUCOSE 115* 124* 88 127* 128*  BUN 19 21 19 18 19   CREATININE 0.69 0.63 0.72 0.76 0.78  CALCIUM 8.4* 8.5* 8.5* 8.1* 8.1*   GFR Estimated Creatinine Clearance: 42.1 mL/min (by C-G formula based on SCr of 0.78 mg/dL). Liver Function Tests: Recent Labs  Lab 11/30/22 1205 12/01/22 0627 12/02/22 0355 12/03/22 0426 12/05/22 0258  AST 60* 64* 43* 42* 49*  ALT 47* 55* 47* 46* 57*  ALKPHOS 76 64 56 67 58  BILITOT 0.6 0.3 0.4 0.4 0.6  PROT 6.1* 6.1* 5.8* 6.3* 6.0*  ALBUMIN 2.8* 2.6* 2.5* 2.7* 2.5*   No results for input(s): "LIPASE", "AMYLASE" in the last 168 hours. No results for input(s): "AMMONIA" in the last 168 hours. Coagulation profile No results for input(s): "INR", "PROTIME" in the last 168 hours.  CBC: Recent Labs  Lab 12/01/22 0627 12/02/22 0355 12/03/22 0426 12/04/22 0346 12/04/22 0651 12/05/22 0258  WBC 14.8* 13.7* 11.7* 6.8  --  8.2  NEUTROABS 12.9* 12.1* 9.0*  --   --   --   HGB 7.5* 7.5* 8.1* 6.9* 7.0* 9.6*  HCT 22.0* 22.6* 24.8* 20.2*  --  27.7*  MCV 94.4 95.0 95.4 92.2  --  91.1  PLT 175 190 214 201  --  217   Cardiac Enzymes: No results for input(s): "CKTOTAL", "CKMB", "CKMBINDEX",  "TROPONINI" in the last 168 hours. BNP (last 3 results) No results for input(s): "PROBNP" in the last 8760 hours. CBG: No results for input(s): "GLUCAP" in the last 168 hours. D-Dimer: No results for input(s): "DDIMER" in the last 72 hours. Hgb A1c: No results for input(s): "HGBA1C" in the last 72 hours. Lipid Profile: No results for input(s): "CHOL", "HDL", "LDLCALC", "TRIG", "CHOLHDL", "LDLDIRECT" in the last 72 hours. Thyroid function studies: No results for input(s): "TSH", "T4TOTAL", "T3FREE", "THYROIDAB" in the last 72 hours.  Invalid input(s): "FREET3" Anemia work up: Recent Labs    12/04/22 0345  FERRITIN 204  TIBC 189*  IRON 21*   Sepsis Labs: Recent Labs  Lab 11/30/22 1205 11/30/22 1447 11/30/22 1916 12/01/22 0627 12/02/22 0355 12/03/22 0426 12/04/22 0346 12/05/22 0258  PROCALCITON 2.63  --   --   --  2.59  --   --   --   WBC  --   --   --    < > 13.7* 11.7* 6.8 8.2  LATICACIDVEN  --  3.9* 4.5*  --  0.6  --   --   --    < > = values in this interval not displayed.    Microbiology Recent Results (from the past 240 hour(s))  SARS Coronavirus 2 by RT PCR (hospital order, performed in [facility] hospital lab) *cepheid single result test* Anterior Nasal Swab     Status: None   Collection Time: 11/30/22 12:05 PM   Specimen: Anterior Nasal  Swab  Result Value Ref Range Status   SARS Coronavirus 2 by RT PCR NEGATIVE NEGATIVE Final    Comment: (NOTE) SARS-CoV-2 target nucleic acids are NOT DETECTED.  The SARS-CoV-2 RNA is generally detectable in upper and lower respiratory specimens during the acute phase of infection. The lowest concentration of SARS-CoV-2 viral copies this assay can detect is 250 copies / mL. A negative result does not preclude SARS-CoV-2 infection and should not be used as the sole basis for treatment or other patient management decisions.  A negative result may occur with improper specimen collection / handling, submission of specimen  other than nasopharyngeal swab, presence of viral mutation(s) within the areas targeted by this assay, and inadequate number of viral copies (<250 copies / mL). A negative result must be combined with clinical observations, patient history, and epidemiological information.  Fact Sheet for Patients:   RoadLapTop.co.za  Fact Sheet for Healthcare Providers: http://kim-miller.com/  This test is not yet approved or  cleared by the [place] FDA and has been authorized for detection and/or diagnosis of SARS-CoV-2 by FDA under an Emergency Use Authorization (EUA).  This EUA will remain in effect (meaning this test can be used) for the duration of the COVID-19 declaration under Section 564(b)(1) of the Act, 21 U.S.C. section 360bbb-3(b)(1), unless the authorization is terminated or revoked sooner.  Performed at [facility], [address] [place], [place] [address]   Blood culture (routine x 2)     Status: None   Collection Time: 11/30/22 12:58 PM   Specimen: BLOOD  Result Value Ref Range Status   Specimen Description BLOOD BLOOD RIGHT HAND  Final   Special Requests   Final    BOTTLES DRAWN AEROBIC AND ANAEROBIC Blood Culture results may not be optimal due to an excessive volume of blood received in culture bottles   Culture   Final    NO GROWTH 5 DAYS Performed at [facility], [address] [place], [place] [address]    Report Status 12/05/2022 FINAL  Final  Blood culture (routine x 2)     Status: None   Collection Time: 11/30/22  1:06 PM   Specimen: BLOOD  Result Value Ref Range Status   Specimen Description BLOOD BLOOD RIGHT HAND  Final   Special Requests   Final    BOTTLES DRAWN AEROBIC AND ANAEROBIC Blood Culture adequate volume   Culture   Final    NO GROWTH 5 DAYS Performed at [facility], [address], [place], [place] [address]    Report Status 12/05/2022 FINAL  Final  MRSA Next Gen  by PCR, Nasal     Status: None   Collection Time: 12/03/22  4:15 AM   Specimen: Nasal Mucosa; Nasal Swab  Result Value Ref Range Status   MRSA by PCR Next Gen NOT DETECTED NOT DETECTED Final    Comment: (NOTE) The GeneXpert MRSA Assay (FDA approved for NASAL specimens only), is one component of a comprehensive MRSA colonization surveillance program. It is not intended to diagnose MRSA infection nor to guide or monitor treatment for MRSA infections. Test performance is not FDA approved in patients less than 51 years old. Performed at [facility], [address] [place], [place] [address]     Procedures and diagnostic studies:  No results found.             LOS: 5 days   Haani Bakula  [facility]   Pager on [email or web address]. If 7PM-7AM, please contact  night-coverage at [email or web address]     12/05/2022, 3:12 PM

## 2022-12-05 NOTE — Progress Notes (Signed)
Occupational Therapy Treatment Patient Details Name: Kristi Clark MRN: [id number] DOB: [birth year] Today's Date: 12/05/2022   History of present illness 72 y.o. female with PMHx of end-stage COPD, chronic hypoxic respiratory failure on 3 L, atrial fibrillation not on AC, CAD, HFpEF, hypertension, hyperlipidemia, who presents to the ED due to shortness of breath, found a pleural effusion. Covid and flu vaccine received Friday 9/20. Dx of CAP (community acquired pneumonia).   OT comments  Kristi Clark was seen for OT treatment on this date. Upon arrival to room pt supine in bed, agreeable to OT Tx session initially but later declines any OOB/EOB activity citing significant fatigue from PT session earlier in the day and recent dose of morphine. OT facilitated ADL management at bed level with education and assist as described below. See ADL section for additional details regarding occupational performance. Pt continues to be functionally limited by generalized weakness and decreased activity tolerance. Pt return verbalizes understanding of education provided t/o session. Pt is progressing toward OT goals and continues to benefit from skilled OT services to maximize return to PLOF and minimize risk of future falls, injury, caregiver burden, and readmission. Will continue to follow POC as written. Discharge recommendation remains appropriate.        If plan is discharge home, recommend the following:  A little help with walking and/or transfers;A little help with bathing/dressing/bathroom;Help with stairs or ramp for entrance;Assist for transportation;Assistance with cooking/housework   Equipment Recommendations       Recommendations for Other Services      Precautions / Restrictions Precautions Precautions: Fall Restrictions Weight Bearing Restrictions: No       Mobility Bed Mobility               General bed mobility comments: NT pt declines OOB/EOB activity 2/2 recent bath and  working with PT earlier this date.    Transfers                         Balance Overall balance assessment: Needs assistance                                         ADL either performed or assessed with clinical judgement   ADL Overall ADL's : Needs assistance/impaired     Grooming: Brushing hair;Bed level;Minimal assistance Grooming Details (indicate cue type and reason): MIN A to reach back of head                               General ADL Comments: Pt endorses increased fatigue this date. Declines OOB/EOB functional activity but agreeable to bed level activity and education session. Educated on enery conservation strategies and pulse ox use. Return demos understanding.    Extremity/Trunk Assessment Upper Extremity Assessment Upper Extremity Assessment: Generalized weakness   Lower Extremity Assessment Lower Extremity Assessment: Generalized weakness        Vision Patient Visual Report: No change from baseline     Perception     Praxis      Cognition Arousal: Lethargic, Suspect due to medications Behavior During Therapy: WFL for tasks assessed/performed Overall Cognitive Status: Within Functional Limits for tasks assessed  Exercises Other Exercises Other Exercises: Extensive education on energy conservation strategies, safe use of AE/DME for ADL management, pulse ox use, and considerations for safe DC home.    Shoulder Instructions       General Comments      Pertinent Vitals/ Pain       Pain Assessment Pain Assessment: No/denies pain  Home Living                                          Prior Functioning/Environment              Frequency  Min 1X/week        Progress Toward Goals  OT Goals(current goals can now be found in the care plan section)  Progress towards OT goals: Progressing toward goals  Acute Rehab OT  Goals Patient Stated Goal: return home OT Goal Formulation: With patient Time For Goal Achievement: 12/15/22 Potential to Achieve Goals: Good  Plan      Co-evaluation                 AM-PAC OT "6 Clicks" Daily Activity     Outcome Measure   Help from another person eating meals?: None Help from another person taking care of personal grooming?: A Little Help from another person toileting, which includes using toliet, bedpan, or urinal?: A Little Help from another person bathing (including washing, rinsing, drying)?: A Little Help from another person to put on and taking off regular upper body clothing?: A Little Help from another person to put on and taking off regular lower body clothing?: A Little 6 Click Score: 19    End of Session Equipment Utilized During Treatment: Oxygen  OT Visit Diagnosis: Other abnormalities of gait and mobility (R26.89);Muscle weakness (generalized) (M62.81);History of falling (Z91.81)   Activity Tolerance Patient limited by fatigue;Patient limited by lethargy   Patient Left in bed;with bed alarm set;with call bell/phone within reach   Nurse Communication Mobility status        Time: 4098-1191 OT Time Calculation (min): 21 min  Charges: OT General Charges $OT Visit: 1 Visit OT Treatments $Self Care/Home Management : 8-22 mins  Rockney Ghee, M.S., OTR/L 12/05/22, 3:57 PM

## 2022-12-05 NOTE — Care Management Important Message (Signed)
Important Message  Patient Details  Name: Kristi Clark MRN: [id number] Date of Birth: 05-Mar-1951   Important Message Given:  Yes - Medicare IM     [name] 12/05/2022, 10:53 AM

## 2022-12-05 NOTE — Progress Notes (Signed)
Physical Therapy Treatment Patient Details Name: Kristi Clark MRN: [id number] DOB: 16-Apr-1950 Today's Date: 12/05/2022   History of Present Illness 72 y.o. female with PMHx of end-stage COPD, chronic hypoxic respiratory failure on 3 L, atrial fibrillation not on AC, CAD, HFpEF, hypertension, hyperlipidemia, who presents to the ED due to shortness of breath, found a pleural effusion. Covid and flu vaccine received Friday 9/20. Dx of CAP (community acquired pneumonia).    PT Comments  Pt was pleasant and motivated to participate during the session and put forth good effort throughout. Pt is mod I for bed mobility, and is supervision for transfers with RW. She is supervision for gait with RW. Pt SpO2 remained above 90% on 4L at rest, as well as seated EOB. Once she stood up and sat in recliner, SpO2 dropped to a low of 85%, increased O2 to 5L with pt SpO2 levels returning above 88% within 2 min. Pt able to perform 2x ambulatory bouts on 5L with a rest break between, SpO2 levels remained above 90% after first bout, but dropped to a low of 87% after second bout. Able to reduce pt back to 4L O2 at end of session with SpO2 remaining in mid 90's. Pt will benefit from continued PT services upon discharge to safely address deficits listed in patient problem list for decreased caregiver assistance and eventual return to PLOF.    If plan is discharge home, recommend the following: A little help with walking and/or transfers;A little help with bathing/dressing/bathroom;Assistance with cooking/housework;Assist for transportation;Assistance with feeding;Help with stairs or ramp for entrance   Can travel by private vehicle        Equipment Recommendations  None recommended by PT    Recommendations for Other Services       Precautions / Restrictions Precautions Precautions: Fall Restrictions Weight Bearing Restrictions: No     Mobility  Bed Mobility Overal bed mobility: Modified Independent                   Transfers Overall transfer level: Needs assistance Equipment used: Rolling walker (2 wheels) Transfers: Sit to/from Stand Sit to Stand: Supervision                Ambulation/Gait Ambulation/Gait assistance: Supervision Gait Distance (Feet): 3 feet x1, 8 Feet x1, 15 feet x1 Assistive device: Rolling walker (2 wheels) Gait Pattern/deviations: Step-through pattern Gait velocity: WNL     General Gait Details: Overall taking steady steps, limited by O2 needs   Stairs             Wheelchair Mobility     Tilt Bed    Modified Rankin (Stroke Patients Only)       Balance Overall balance assessment: Needs assistance Sitting-balance support: Feet supported Sitting balance-Leahy Scale: Good     Standing balance support: Bilateral upper extremity supported Standing balance-Leahy Scale: Good                              Cognition Arousal: Alert Behavior During Therapy: WFL for tasks assessed/performed Overall Cognitive Status: Within Functional Limits for tasks assessed                                          Exercises      General Comments        Pertinent Vitals/Pain Pain Assessment Pain  Assessment: No/denies pain    Home Living                          Prior Function            PT Goals (current goals can now be found in the care plan section) Progress towards PT goals: Progressing toward goals    Frequency    Min 1X/week      PT Plan      Co-evaluation              AM-PAC PT "6 Clicks" Mobility   Outcome Measure  Help needed turning from your back to your side while in a flat bed without using bedrails?: None Help needed moving from lying on your back to sitting on the side of a flat bed without using bedrails?: None Help needed moving to and from a bed to a chair (including a wheelchair)?: A Little Help needed standing up from a chair using your arms (e.g.,  wheelchair or bedside chair)?: A Little Help needed to walk in hospital room?: A Little Help needed climbing 3-5 steps with a railing? : A Little 6 Click Score: 20    End of Session Equipment Utilized During Treatment: Gait belt Activity Tolerance: Other (comment) (limited by respiratory/O2 needs) Patient left: with call bell/phone within reach;in chair;with chair alarm set Nurse Communication: Mobility status, SpO2 with activity per above PT Visit Diagnosis: Other abnormalities of gait and mobility (R26.89);Difficulty in walking, not elsewhere classified (R26.2);Muscle weakness (generalized) (M62.81)     Time: 3086-5784 PT Time Calculation (min) (ACUTE ONLY): 28 min  Charges:                            Cecile Sheerer, SPT 12/05/22, 11:37 AM

## 2022-12-05 NOTE — Progress Notes (Addendum)
Mobility Specialist - Progress Note  During mobility: SpO2 98% Post-mobility: SPO2 99%   12/05/22 1128  Mobility  Activity Transferred to/from [facility];Ambulated with assistance in room  Level of Assistance Standby assist, set-up cues, supervision of patient - no hands on  Assistive Device Front wheel walker  Distance Ambulated (ft) 12 ft  Activity Response Tolerated well  $Mobility charge 1 Mobility  Mobility Specialist Start Time (ACUTE ONLY) 1051  Mobility Specialist Stop Time (ACUTE ONLY) 1110  Mobility Specialist Time Calculation (min) (ACUTE ONLY) 19 min   Pt seated in the recliner upon entry, utilizing 4L . Pt STS to RW indep and amb to/from the [facility] SBA, tolerated well. O2 WNL during activity. Pt returned to the recliner, with alarm set and needs within reach. NT notified.   Zetta Bills Mobility Specialist 12/05/22 11:32 AM

## 2022-12-05 NOTE — Progress Notes (Signed)
Patient has continued to have difficulty urinating. She was assisted to [facility] but unable to void. Bladder scan showed 447 and MD placed an order for straight cath. However before straight cath could be completed, patient was able to void 400 mLs.

## 2022-12-06 DIAGNOSIS — A419 Sepsis, unspecified organism: Secondary | ICD-10-CM | POA: Diagnosis not present

## 2022-12-06 DIAGNOSIS — R6521 Severe sepsis with septic shock: Secondary | ICD-10-CM | POA: Diagnosis not present

## 2022-12-06 DIAGNOSIS — J441 Chronic obstructive pulmonary disease with (acute) exacerbation: Secondary | ICD-10-CM | POA: Diagnosis not present

## 2022-12-06 MED ORDER — HYDRALAZINE HCL 20 MG/ML IJ SOLN
5.0000 mg | INTRAMUSCULAR | Status: AC | PRN
  Administered 2022-12-06 – 2022-12-07 (×2): 5 mg via INTRAVENOUS
  Filled 2022-12-06 (×2): qty 1

## 2022-12-06 MED ORDER — PREDNISONE 20 MG PO TABS
30.0000 mg | ORAL_TABLET | Freq: Every day | ORAL | Status: DC
  Administered 2022-12-06 – 2022-12-07 (×2): 30 mg via ORAL
  Filled 2022-12-06 (×2): qty 1

## 2022-12-06 NOTE — Plan of Care (Signed)
  Problem: Education: Goal: Knowledge of General Education information will improve Description: Including pain rating scale, medication(s)/side effects and non-pharmacologic comfort measures 12/06/2022 1910 by Orlene Plum, RN Outcome: Progressing 12/06/2022 1902 by Orlene Plum, RN Outcome: Progressing   Problem: Health Behavior/Discharge Planning: Goal: Ability to manage health-related needs will improve 12/06/2022 1910 by Orlene Plum, RN Outcome: Progressing 12/06/2022 1902 by Orlene Plum, RN Outcome: Progressing   Problem: Clinical Measurements: Goal: Ability to maintain clinical measurements within normal limits will improve 12/06/2022 1910 by Orlene Plum, RN Outcome: Progressing 12/06/2022 1902 by Orlene Plum, RN Outcome: Progressing Goal: Will remain free from infection 12/06/2022 1910 by Orlene Plum, RN Outcome: Progressing 12/06/2022 1902 by Orlene Plum, RN Outcome: Progressing Goal: Diagnostic test results will improve 12/06/2022 1910 by Orlene Plum, RN Outcome: Progressing 12/06/2022 1902 by Orlene Plum, RN Outcome: Progressing Goal: Respiratory complications will improve 12/06/2022 1910 by Orlene Plum, RN Outcome: Progressing 12/06/2022 1902 by Orlene Plum, RN Outcome: Progressing Goal: Cardiovascular complication will be avoided 12/06/2022 1910 by Orlene Plum, RN Outcome: Progressing 12/06/2022 1902 by Orlene Plum, RN Outcome: Progressing   Problem: Activity: Goal: Risk for activity intolerance will decrease 12/06/2022 1910 by Orlene Plum, RN Outcome: Progressing 12/06/2022 1902 by Orlene Plum, RN Outcome: Progressing   Problem: Nutrition: Goal: Adequate nutrition will be maintained 12/06/2022 1910 by Orlene Plum, RN Outcome: Progressing 12/06/2022 1902 by Orlene Plum, RN Outcome: Progressing   Problem: Coping: Goal: Level of anxiety will decrease 12/06/2022  1910 by Orlene Plum, RN Outcome: Progressing 12/06/2022 1902 by Orlene Plum, RN Outcome: Progressing   Problem: Elimination: Goal: Will not experience complications related to bowel motility 12/06/2022 1910 by Orlene Plum, RN Outcome: Progressing 12/06/2022 1902 by Orlene Plum, RN Outcome: Progressing Goal: Will not experience complications related to urinary retention 12/06/2022 1910 by Orlene Plum, RN Outcome: Progressing 12/06/2022 1902 by Orlene Plum, RN Outcome: Progressing   Problem: Pain Managment: Goal: General experience of comfort will improve 12/06/2022 1910 by Orlene Plum, RN Outcome: Progressing 12/06/2022 1902 by Orlene Plum, RN Outcome: Progressing   Problem: Safety: Goal: Ability to remain free from injury will improve 12/06/2022 1910 by Orlene Plum, RN Outcome: Progressing 12/06/2022 1902 by Orlene Plum, RN Outcome: Progressing   Problem: Skin Integrity: Goal: Risk for impaired skin integrity will decrease 12/06/2022 1910 by Orlene Plum, RN Outcome: Progressing 12/06/2022 1902 by Orlene Plum, RN Outcome: Progressing    Pt ao x4, pt requiring 3-4L of o2 at rest and requires 5-6L of o2 with any exertion. Pt up to [facility] and chair mult times this shift. Pt pain controlled well. Pt had 2 BM this shift. Pt has no complaints at this time

## 2022-12-06 NOTE — Progress Notes (Signed)
Physical Therapy Treatment Patient Details Name: Kristi Clark MRN: [id number] DOB: 1950-08-04 Today's Date: 12/06/2022   History of Present Illness 72 y.o. female with PMHx of end-stage COPD, chronic hypoxic respiratory failure on 3 L, atrial fibrillation not on AC, CAD, HFpEF, hypertension, hyperlipidemia, who presents to the ED due to shortness of breath, found a pleural effusion. Covid and flu vaccine received Friday 9/20. Dx of CAP (community acquired pneumonia).    PT Comments  Pt was pleasant and motivated to participate during the session and put forth good effort throughout. Pt continues to be mod I for bed mobility and supervision for transfers and gait with RW. Pt kept on 4L O2 throughout session, initial SpO2 reading 98% at rest. Pt's SpO2 remained above 90% for bed mobility and STS, dropping after initial ~12 foot bout with RW to 87%; recovered above 90% within ~1 min during seated rest break. Pt able to perform 2 additional 12 foot bouts with rest break between. SpO2 dropped to 85% after end of final ambulatory bout; recovered to 94% within ~2 min. Pt will benefit from continued PT services upon discharge to safely address deficits listed in patient problem list for decreased caregiver assistance and eventual return to PLOF.      If plan is discharge home, recommend the following: A little help with walking and/or transfers;A little help with bathing/dressing/bathroom;Assistance with cooking/housework;Assist for transportation;Help with stairs or ramp for entrance   Can travel by private vehicle        Equipment Recommendations  None recommended by PT    Recommendations for Other Services       Precautions / Restrictions Precautions Precautions: Fall Restrictions Weight Bearing Restrictions: No     Mobility  Bed Mobility Overal bed mobility: Modified Independent Bed Mobility: Supine to Sit     Supine to sit: Modified independent (Device/Increase time)      General bed mobility comments: no cues or assist needed    Transfers Overall transfer level: Needs assistance Equipment used: Rolling walker (2 wheels) Transfers: Sit to/from Stand Sit to Stand: Supervision                Ambulation/Gait Ambulation/Gait assistance: Supervision Gait Distance (Feet): 12 Feet x3 Assistive device: Rolling walker (2 wheels) Gait Pattern/deviations: Step-through pattern Gait velocity: WNL     General Gait Details: Steady steps, continues to be limited by O2 needs   Stairs             Wheelchair Mobility     Tilt Bed    Modified Rankin (Stroke Patients Only)       Balance Overall balance assessment: Needs assistance Sitting-balance support: Feet supported Sitting balance-Leahy Scale: Normal     Standing balance support: Bilateral upper extremity supported, During functional activity Standing balance-Leahy Scale: Good Standing balance comment: static standing with RW                            Cognition Arousal: Alert Behavior During Therapy: WFL for tasks assessed/performed Overall Cognitive Status: Within Functional Limits for tasks assessed                                          Exercises      General Comments        Pertinent Vitals/Pain Pain Assessment Pain Assessment: No/denies pain    Home  Living                          Prior Function            PT Goals (current goals can now be found in the care plan section) Progress towards PT goals: Progressing toward goals    Frequency    Min 1X/week      PT Plan      Co-evaluation              AM-PAC PT "6 Clicks" Mobility   Outcome Measure  Help needed turning from your back to your side while in a flat bed without using bedrails?: None Help needed moving from lying on your back to sitting on the side of a flat bed without using bedrails?: None Help needed moving to and from a bed to a chair  (including a wheelchair)?: A Little Help needed standing up from a chair using your arms (e.g., wheelchair or bedside chair)?: A Little Help needed to walk in hospital room?: A Little Help needed climbing 3-5 steps with a railing? : A Little 6 Click Score: 20    End of Session Equipment Utilized During Treatment: Gait belt Activity Tolerance: Other (comment);Patient tolerated treatment well (limited by respiratory/O2 needs) Patient left: with call bell/phone within reach;in chair;with chair alarm set;with family/visitor present Nurse Communication: Mobility status PT Visit Diagnosis: Other abnormalities of gait and mobility (R26.89);Difficulty in walking, not elsewhere classified (R26.2);Muscle weakness (generalized) (M62.81)     Time: 9629-5284 PT Time Calculation (min) (ACUTE ONLY): 25 min  Charges:                            Cecile Sheerer, SPT 12/06/22, 4:18 PM

## 2022-12-06 NOTE — Progress Notes (Signed)
Progress Note    Kristi Clark  ZOX:[id number] DOB: 1950/08/28  DOA: 11/30/2022 PCP: Dorcas Carrow, DO      Brief Narrative:    Medical records reviewed and are as summarized below:  Kristi Clark is a 72 y.o. 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  She also complained of subjective fever and sweating at night.      Assessment/Plan:   Principal Problem:   Septic shock (HCC) Active Problems:   CAP (community acquired pneumonia)   Pleural effusion on right   COPD with acute exacerbation (HCC)   Chronic respiratory failure with hypoxia (HCC)   AKI (acute kidney injury) (HCC)   Essential hypertension   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 (HCC)   Paroxysmal A-fib (HCC)   History of CAD (coronary artery disease)   Elevated LFTs    Body mass index is 23.03 kg/m.   Septic shock secondary to community-acquired multifocal pneumonia: Plan to complete 7 days of antibiotics today. CTA chest did not show any evidence of pulmonary embolism.   Small bilateral pleural effusion: No indication for thoracentesis at this time.   Advanced COPD with exacerbation: Continue prednisone but decrease dose.  Continue bronchodilators.  Severe anemia, acute on chronic anemia: S/p transfusion with 1 unit of PRBCs on 12/04/2022.  Hemoglobin improved from 6.9 to 9.6.     Acute on chronic hypoxemic respiratory failure: She is on 3 L/min oxygen via [place].  ABG showed pH 7.45, pCO2 47, pO2 56, O2 sat 91% on FiO2 of 32%   Chronic diastolic CHF: S/p treatment with IV Lasix.  Doubt CHF exacerbation at this time.  Continue oral Lasix for now. BNP 591.6 but patient has had chronically elevated BNP for some time  now.   Acute urinary retention: In and out urethral catheterization as needed.   Anxiety: Xanax as needed for anxiety.   Paroxysmal atrial fibrillation: Continue amiodarone and metoprolol.   AKI: Improved.  Monitor BMP.   Elevated liver enzymes: May be due to sepsis.  Liver enzymes are better.  She also has history of elevated liver enzymes.  Of note patient is status post cholecystectomy.  Monitor liver enzymes.   CAD: Continue aspirin, Plavix and statin   Chronic hyponatremia: Sodium level is stable.   Unfortunately, patient still feels short of breath with prolonged exertion.  She does not feel ready to go home today.  She is still undecided about going home with hospice.   Diet Order             DIET SOFT Room service appropriate? Yes; Fluid consistency: Thin  Diet effective now                            Consultants: None  Procedures: None    Medications:    sodium chloride   Intravenous Once   amiodarone  200 mg Oral Daily   aspirin EC  81 mg Oral Daily   atorvastatin  40 mg Oral Q supper   budesonide (PULMICORT) nebulizer solution  0.25 mg Nebulization BID   clopidogrel  75 mg Oral Daily   enoxaparin (LOVENOX) injection  40 mg Subcutaneous Q24H   feeding supplement  237 mL Oral BID BM   ferrous  sulfate  325 mg Oral Q breakfast   hydrALAZINE  25 mg Oral BID   ipratropium-albuterol  3 mL Nebulization TID   isosorbide mononitrate  60 mg Oral Daily   metoprolol succinate  50 mg Oral Daily   mirtazapine  45 mg Oral QHS   pantoprazole  40 mg Oral BID   predniSONE  30 mg Oral Q breakfast   QUEtiapine  100 mg Oral QHS   Continuous Infusions:  ceFEPime (MAXIPIME) IV 200 mL/hr at 12/06/22 0352     Anti-infectives (From admission, onward)    Start     Dose/Rate Route Frequency Ordered Stop   12/04/22 1000  azithromycin (ZITHROMAX) tablet 500 mg        500 mg Oral Daily 12/03/22 1100 12/04/22 0925   12/03/22 1600  ceFEPIme (MAXIPIME) 2  g in sodium chloride 0.9 % 100 mL IVPB        2 g 200 mL/hr over 30 Minutes Intravenous Every 12 hours 12/03/22 1501 12/06/22 1900   12/01/22 1000  cefTRIAXone (ROCEPHIN) 2 g in sodium chloride 0.9 % 100 mL IVPB  Status:  Discontinued        2 g 200 mL/hr over 30 Minutes Intravenous Every 24 hours 11/30/22 1505 12/03/22 1417   12/01/22 1000  azithromycin (ZITHROMAX) 500 mg in sodium chloride 0.9 % 250 mL IVPB        500 mg 250 mL/hr over 60 Minutes Intravenous Every 24 hours 11/30/22 1505 12/03/22 1215   11/30/22 1600  metroNIDAZOLE (FLAGYL) IVPB 500 mg  Status:  Discontinued        500 mg 100 mL/hr over 60 Minutes Intravenous Every 12 hours 11/30/22 1512 11/30/22 1753   11/30/22 1315  cefTRIAXone (ROCEPHIN) 2 g in sodium chloride 0.9 % 100 mL IVPB        2 g 200 mL/hr over 30 Minutes Intravenous  Once 11/30/22 1311 11/30/22 1448   11/30/22 1315  azithromycin (ZITHROMAX) 500 mg in sodium chloride 0.9 % 250 mL IVPB        500 mg 250 mL/hr over 60 Minutes Intravenous  Once 11/30/22 1311 11/30/22 1700              Family Communication/Anticipated D/C date and plan/Code Status   DVT prophylaxis: enoxaparin (LOVENOX) injection 40 mg Start: 11/30/22 2200     Code Status: Limited: Do not attempt resuscitation (DNR) -DNR-LIMITED -Do Not Intubate/DNI   Family Communication: None Disposition Plan: Plan to discharge home   Status is: Inpatient Remains inpatient appropriate because: Septic shock from pneumonia       Subjective:   Interval events noted.  She complains of shortness of breath worse with minimal exertion.  Cough is better.  Objective:    Vitals:   12/06/22 0804 12/06/22 1004 12/06/22 1323 12/06/22 1324  BP: (!) 178/54 (!) 154/54    Pulse: 80 79  83  Resp: 18   (!) 24  Temp: 98.1 F (36.7 C)     TempSrc: Oral     SpO2: 97%  (!) 81% 94%  Weight:      Height:       No data found.   Intake/Output Summary (Last 24 hours) at 12/06/2022 1400 Last data  filed at 12/06/2022 0803 Gross per 24 hour  Intake 1219.29 ml  Output 2400 ml  Net -1180.71 ml   Filed Weights   11/30/22 1111  Weight: 51.7 kg    Exam:  GEN: NAD SKIN: Warm and dry EYES: No  pallor or icterus ENT: MMM CV: RRR PULM: Decreased air entry bilaterally, bilateral wheezing ABD: soft, ND, NT, +BS CNS: AAO x 3, non focal EXT: No edema or tenderness      Data Reviewed:   I have personally reviewed following labs and imaging studies:  Labs: Labs show the following:   Basic Metabolic Panel: Recent Labs  Lab 12/01/22 0627 12/02/22 0355 12/03/22 0426 12/04/22 0346 12/05/22 0258  NA 131* 131* 134* 132* 130*  K 3.8 3.9 3.6 3.5 3.5  CL 98 96* 97* 94* 91*  CO2 25 29 29 30 30   GLUCOSE 115* 124* 88 127* 128*  BUN 19 21 19 18 19   CREATININE 0.69 0.63 0.72 0.76 0.78  CALCIUM 8.4* 8.5* 8.5* 8.1* 8.1*   GFR Estimated Creatinine Clearance: 42.1 mL/min (by C-G formula based on SCr of 0.78 mg/dL). Liver Function Tests: Recent Labs  Lab 11/30/22 1205 12/01/22 0627 12/02/22 0355 12/03/22 0426 12/05/22 0258  AST 60* 64* 43* 42* 49*  ALT 47* 55* 47* 46* 57*  ALKPHOS 76 64 56 67 58  BILITOT 0.6 0.3 0.4 0.4 0.6  PROT 6.1* 6.1* 5.8* 6.3* 6.0*  ALBUMIN 2.8* 2.6* 2.5* 2.7* 2.5*   No results for input(s): "LIPASE", "AMYLASE" in the last 168 hours. No results for input(s): "AMMONIA" in the last 168 hours. Coagulation profile No results for input(s): "INR", "PROTIME" in the last 168 hours.  CBC: Recent Labs  Lab 12/01/22 0627 12/02/22 0355 12/03/22 0426 12/04/22 0346 12/04/22 0651 12/05/22 0258  WBC 14.8* 13.7* 11.7* 6.8  --  8.2  NEUTROABS 12.9* 12.1* 9.0*  --   --   --   HGB 7.5* 7.5* 8.1* 6.9* 7.0* 9.6*  HCT 22.0* 22.6* 24.8* 20.2*  --  27.7*  MCV 94.4 95.0 95.4 92.2  --  91.1  PLT 175 190 214 201  --  217   Cardiac Enzymes: No results for input(s): "CKTOTAL", "CKMB", "CKMBINDEX", "TROPONINI" in the last 168 hours. BNP (last 3 results) No  results for input(s): "PROBNP" in the last 8760 hours. CBG: No results for input(s): "GLUCAP" in the last 168 hours. D-Dimer: No results for input(s): "DDIMER" in the last 72 hours. Hgb A1c: No results for input(s): "HGBA1C" in the last 72 hours. Lipid Profile: No results for input(s): "CHOL", "HDL", "LDLCALC", "TRIG", "CHOLHDL", "LDLDIRECT" in the last 72 hours. Thyroid function studies: No results for input(s): "TSH", "T4TOTAL", "T3FREE", "THYROIDAB" in the last 72 hours.  Invalid input(s): "FREET3" Anemia work up: Recent Labs    12/04/22 0345  FERRITIN 204  TIBC 189*  IRON 21*   Sepsis Labs: Recent Labs  Lab 11/30/22 1205 11/30/22 1447 11/30/22 1916 12/01/22 0627 12/02/22 0355 12/03/22 0426 12/04/22 0346 12/05/22 0258  PROCALCITON 2.63  --   --   --  2.59  --   --   --   WBC  --   --   --    < > 13.7* 11.7* 6.8 8.2  LATICACIDVEN  --  3.9* 4.5*  --  0.6  --   --   --    < > = values in this interval not displayed.    Microbiology Recent Results (from the past 240 hour(s))  SARS Coronavirus 2 by RT PCR (hospital order, performed in [facility] hospital lab) *cepheid single result test* Anterior Nasal Swab     Status: None   Collection Time: 11/30/22 12:05 PM   Specimen: Anterior Nasal Swab  Result Value Ref Range Status   SARS  Coronavirus 2 by RT PCR NEGATIVE NEGATIVE Final    Comment: (NOTE) SARS-CoV-2 target nucleic acids are NOT DETECTED.  The SARS-CoV-2 RNA is generally detectable in upper and lower respiratory specimens during the acute phase of infection. The lowest concentration of SARS-CoV-2 viral copies this assay can detect is 250 copies / mL. A negative result does not preclude SARS-CoV-2 infection and should not be used as the sole basis for treatment or other patient management decisions.  A negative result may occur with improper specimen collection / handling, submission of specimen other than nasopharyngeal swab, presence of viral mutation(s)  within the areas targeted by this assay, and inadequate number of viral copies (<250 copies / mL). A negative result must be combined with clinical observations, patient history, and epidemiological information.  Fact Sheet for Patients:   RoadLapTop.co.za  Fact Sheet for Healthcare Providers: http://kim-miller.com/  This test is not yet approved or  cleared by the [place] FDA and has been authorized for detection and/or diagnosis of SARS-CoV-2 by FDA under an Emergency Use Authorization (EUA).  This EUA will remain in effect (meaning this test can be used) for the duration of the COVID-19 declaration under Section 564(b)(1) of the Act, 21 U.S.C. section 360bbb-3(b)(1), unless the authorization is terminated or revoked sooner.  Performed at [facility], [address] [place], [place] [address]   Blood culture (routine x 2)     Status: None   Collection Time: 11/30/22 12:58 PM   Specimen: BLOOD  Result Value Ref Range Status   Specimen Description BLOOD BLOOD RIGHT HAND  Final   Special Requests   Final    BOTTLES DRAWN AEROBIC AND ANAEROBIC Blood Culture results may not be optimal due to an excessive volume of blood received in culture bottles   Culture   Final    NO GROWTH 5 DAYS Performed at [facility], [address], [place], [place] [address]    Report Status 12/05/2022 FINAL  Final  Blood culture (routine x 2)     Status: None   Collection Time: 11/30/22  1:06 PM   Specimen: BLOOD  Result Value Ref Range Status   Specimen Description BLOOD BLOOD RIGHT HAND  Final   Special Requests   Final    BOTTLES DRAWN AEROBIC AND ANAEROBIC Blood Culture adequate volume   Culture   Final    NO GROWTH 5 DAYS Performed at [facility], [address], [place], [place] [address]    Report Status 12/05/2022 FINAL  Final  MRSA Next Gen by PCR, Nasal     Status: None   Collection Time: 12/03/22   4:15 AM   Specimen: Nasal Mucosa; Nasal Swab  Result Value Ref Range Status   MRSA by PCR Next Gen NOT DETECTED NOT DETECTED Final    Comment: (NOTE) The GeneXpert MRSA Assay (FDA approved for NASAL specimens only), is one component of a comprehensive MRSA colonization surveillance program. It is not intended to diagnose MRSA infection nor to guide or monitor treatment for MRSA infections. Test performance is not FDA approved in patients less than 58 years old. Performed at [facility], [address] [place], [place] [address]     Procedures and diagnostic studies:  No results found.             LOS: 6 days   Kailena Lubas  [facility]   Pager on [email or web address]. If 7PM-7AM, please contact night-coverage at [email or web address]     12/06/2022, 2:00 PM

## 2022-12-06 NOTE — Progress Notes (Signed)
Assisted pt back to bed from bedside commode. Pt desated on exertion to low 80's with increased work of breathing and grunting. Pt placed on 7L simple mask for nebulizer tx. Pt rebounded to low 90's. Pt left on 4Lnc with sats in mid to lo2 90's.

## 2022-12-06 NOTE — TOC Progression Note (Signed)
Transition of Care [facility]) - Progression Note    Patient Details  Name: Kristi Clark MRN: [id number] Date of Birth: [birth year]  [facility]) CM/SW Contact  Chapman Fitch, RN Phone Number: 12/06/2022, 10:08 AM  Clinical Narrative:      Plans remains for patient to return home with home health services at time of discharge through [facility]   Expected Discharge Plan: Home w Home Health Services    Expected Discharge Plan and Services                                               Social Determinants of Health (SDOH) Interventions SDOH Screenings   Food Insecurity: No Food Insecurity (11/30/2022)  Housing: Low Risk  (11/30/2022)  Transportation Needs: No Transportation Needs (11/30/2022)  Utilities: Not At Risk (11/30/2022)  Alcohol Screen: Low Risk  (06/13/2022)  Depression (PHQ2-9): Low Risk  (11/25/2022)  Financial Resource Strain: Low Risk  (06/13/2022)  Physical Activity: Insufficiently Active (06/13/2022)  Social Connections: Socially Isolated (06/13/2022)  Stress: No Stress Concern Present (06/13/2022)  Tobacco Use: Medium Risk (11/30/2022)    Readmission Risk Interventions    12/01/2022   11:35 AM 03/06/2022   10:27 AM  Readmission Risk Prevention Plan  Transportation Screening Complete Complete  PCP or Specialist Appt within 3-5 Days  Complete  HRI or Home Care Consult Complete Complete  Social Work Consult for Recovery Care Planning/Counseling Complete Complete  Palliative Care Screening Not Applicable Not Applicable  Medication Review Oceanographer) Complete Complete

## 2022-12-07 ENCOUNTER — Ambulatory Visit: Payer: Medicare Other | Admitting: Cardiology

## 2022-12-07 DIAGNOSIS — A419 Sepsis, unspecified organism: Secondary | ICD-10-CM | POA: Diagnosis not present

## 2022-12-07 DIAGNOSIS — R6521 Severe sepsis with septic shock: Secondary | ICD-10-CM | POA: Diagnosis not present

## 2022-12-07 LAB — BASIC METABOLIC PANEL
Anion gap: 7 (ref 5–15)
BUN: 20 mg/dL (ref 8–23)
CO2: 32 mmol/L (ref 22–32)
Calcium: 8.3 mg/dL — ABNORMAL LOW (ref 8.9–10.3)
Chloride: 97 mmol/L — ABNORMAL LOW (ref 98–111)
Creatinine, Ser: 0.78 mg/dL (ref 0.44–1.00)
GFR, Estimated: >60 mL/min (ref >60)
Glucose, Bld: 123 mg/dL — ABNORMAL HIGH (ref 70–99)
Potassium: 4.3 mmol/L (ref 3.5–5.1)
Sodium: 136 mmol/L (ref 135–145)

## 2022-12-07 LAB — CBC WITH DIFFERENTIAL/PLATELET
Abs Immature Granulocytes: 0.36 10*3/uL — ABNORMAL HIGH (ref 0.00–0.07)
Basophils Absolute: 0 10*3/uL (ref 0.0–0.1)
Basophils Relative: 0 %
Eosinophils Absolute: 0 10*3/uL (ref 0.0–0.5)
Eosinophils Relative: 0 %
HCT: 28.7 % — ABNORMAL LOW (ref 36.0–46.0)
Hemoglobin: 9.5 g/dL — ABNORMAL LOW (ref 12.0–15.0)
Immature Granulocytes: 4 %
Lymphocytes Relative: 16 %
Lymphs Abs: 1.4 10*3/uL (ref 0.7–4.0)
MCH: 31.3 pg (ref 26.0–34.0)
MCHC: 33.1 g/dL (ref 30.0–36.0)
MCV: 94.4 fL (ref 80.0–100.0)
Monocytes Absolute: 0.3 10*3/uL (ref 0.1–1.0)
Monocytes Relative: 3 %
Neutro Abs: 6.7 10*3/uL (ref 1.7–7.7)
Neutrophils Relative %: 77 %
Platelets: 279 10*3/uL (ref 150–400)
RBC: 3.04 MIL/uL — ABNORMAL LOW (ref 3.87–5.11)
RDW: 14.7 % (ref 11.5–15.5)
WBC: 8.7 10*3/uL (ref 4.0–10.5)
nRBC: 0 % (ref 0.0–0.2)

## 2022-12-07 MED ORDER — IPRATROPIUM-ALBUTEROL 0.5-2.5 (3) MG/3ML IN SOLN
3.0000 mL | Freq: Four times a day (QID) | RESPIRATORY_TRACT | Status: DC
  Administered 2022-12-07 – 2022-12-08 (×4): 3 mL via RESPIRATORY_TRACT
  Filled 2022-12-07 (×2): qty 3

## 2022-12-07 MED ORDER — IPRATROPIUM-ALBUTEROL 0.5-2.5 (3) MG/3ML IN SOLN: 3.0000 mL | Freq: Four times a day (QID) | RESPIRATORY_TRACT | Status: DC

## 2022-12-07 MED ORDER — OXYCODONE HCL 5 MG PO TABS
5.0000 mg | ORAL_TABLET | ORAL | Status: DC | PRN
  Administered 2022-12-07: 5 mg via ORAL
  Filled 2022-12-07 (×2): qty 1

## 2022-12-07 MED ORDER — MORPHINE SULFATE (PF) 2 MG/ML IV SOLN: 2.0000 mg | INTRAVENOUS | Status: DC | PRN

## 2022-12-07 MED ORDER — ARFORMOTEROL TARTRATE 15 MCG/2ML IN NEBU
15.0000 ug | INHALATION_SOLUTION | Freq: Two times a day (BID) | RESPIRATORY_TRACT | Status: DC
  Administered 2022-12-07 – 2022-12-09 (×5): 15 ug via RESPIRATORY_TRACT
  Filled 2022-12-07 (×6): qty 2

## 2022-12-07 NOTE — Progress Notes (Signed)
Post neb tx pt complained of chest pain, RN notified

## 2022-12-07 NOTE — Progress Notes (Signed)
Occupational Therapy Treatment Patient Details Name: Kristi Clark MRN: [id number] DOB: 1950-05-23 Today's Date: 12/07/2022   History of present illness 72 y.o. female with PMHx of end-stage COPD, chronic hypoxic respiratory failure on 3 L, atrial fibrillation not on AC, CAD, HFpEF, hypertension, hyperlipidemia, who presents to the ED due to shortness of breath, found a pleural effusion. Covid and flu vaccine received Friday 9/20. Dx of CAP (community acquired pneumonia).   OT comments  Pt seen for OT treatment on this date. Upon arrival to room pt was in upright position in bed, agreeable to tx. Tx session targeted improving improving functional activity tolerance through ADL tasks. Pt currently requires supervision during bed mobility. Pt requires RW + CGA for stability when amb around bed approx 8'. Pt spo2 monitored during session, spo2 >90% throughout tx on 5 L via .  Education provided re: energy conservation techniques as pt reports SOB throughout tx session, good carry over noted. Pt making good progress toward goals, will continue to follow POC. Discharge recommendation remains appropriate. OT will follow acutely.       If plan is discharge home, recommend the following:  A little help with walking and/or transfers;A little help with bathing/dressing/bathroom;Help with stairs or ramp for entrance;Assist for transportation;Assistance with cooking/housework   Equipment Recommendations  Tub/shower seat;Other (comment)    Recommendations for Other Services      Precautions / Restrictions Precautions Precautions: Fall Restrictions Weight Bearing Restrictions: No       Mobility Bed Mobility Overal bed mobility: Needs Assistance Bed Mobility: Supine to Sit     Supine to sit: Supervision, HOB elevated, Used rails          Transfers Overall transfer level: Needs assistance Equipment used: Rolling walker (2 wheels) Transfers: Sit to/from Stand Sit to Stand: Contact  guard assist           General transfer comment: Pt reported SOB; utilized bio feedback and verbal cues to resume normal breathing patterns     Balance Overall balance assessment: Needs assistance Sitting-balance support: Feet supported Sitting balance-Leahy Scale: Normal Sitting balance - Comments: Pt demonstrated normal sitting balance when resting on EOB for ~ 4 mins in prep for mobility   Standing balance support: Bilateral upper extremity supported, During functional activity Standing balance-Leahy Scale: Fair Standing balance comment: Pt completed static standing with RW                           ADL either performed or assessed with clinical judgement   ADL Overall ADL's : Needs assistance/impaired Eating/Feeding: Supervision/ safety   Grooming: Wash/dry face;Bed level                                 General ADL Comments: On this data Pt ADL participation was limited due to SOB.    Extremity/Trunk Assessment Upper Extremity Assessment Upper Extremity Assessment: Generalized weakness   Lower Extremity Assessment Lower Extremity Assessment: Generalized weakness        Vision Patient Visual Report: No change from baseline     Perception     Praxis      Cognition Arousal: Alert Behavior During Therapy: WFL for tasks assessed/performed Overall Cognitive Status: Within Functional Limits for tasks assessed  General Comments: Pt was AO during session        Exercises Other Exercises Other Exercises: Edu re: Breathing techniques when feeling SOB, energy conservation tactics    Shoulder Instructions       General Comments      Pertinent Vitals/ Pain       Pain Assessment Pain Assessment: No/denies pain  Home Living                                          Prior Functioning/Environment              Frequency  Min 1X/week        Progress Toward  Goals  OT Goals(current goals can now be found in the care plan section)  Progress towards OT goals: Progressing toward goals  Acute Rehab OT Goals Patient Stated Goal: return home OT Goal Formulation: With patient Time For Goal Achievement: 12/15/22 Potential to Achieve Goals: Good  Plan      Co-evaluation                 AM-PAC OT "6 Clicks" Daily Activity     Outcome Measure   Help from another person eating meals?: None Help from another person taking care of personal grooming?: A Little Help from another person toileting, which includes using toliet, bedpan, or urinal?: A Little Help from another person bathing (including washing, rinsing, drying)?: A Little Help from another person to put on and taking off regular upper body clothing?: A Little Help from another person to put on and taking off regular lower body clothing?: A Little 6 Click Score: 19    End of Session Equipment Utilized During Treatment: Oxygen;Gait belt;Rolling walker (2 wheels)  OT Visit Diagnosis: Other abnormalities of gait and mobility (R26.89);Muscle weakness (generalized) (M62.81);History of falling (Z91.81)   Activity Tolerance Other (comment) (Patient limited by low O2 levels)   Patient Left in bed;with bed alarm set;with call bell/phone within reach   Nurse Communication Mobility status         Time: 2025-4270 OT Time Calculation (min): 18 min  Charges: OT General Charges $OT Visit: 1 Visit OT Treatments $Self Care/Home Management : 8-22 mins  Glenard Haring, OTS

## 2022-12-07 NOTE — Progress Notes (Signed)
PROGRESS NOTE    Kristi Clark  YQM:[id number] DOB: 05/15/1950 DOA: 11/30/2022 PCP: Dorcas Carrow, DO    Brief Narrative:   Kristi Clark is a 72 y.o. 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  She also complained of subjective fever and sweating at night.    Assessment & Plan:   Principal Problem:   Septic shock (HCC) Active Problems:   CAP (community acquired pneumonia)   Pleural effusion on right   COPD with acute exacerbation (HCC)   Chronic respiratory failure with hypoxia (HCC)   AKI (acute kidney injury) (HCC)   Essential hypertension   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 (HCC)   Paroxysmal A-fib (HCC)   History of CAD (coronary artery disease)   Elevated LFTs  Septic shock secondary to community-acquired multifocal pneumonia: Completed course of treatment with IV cefepime and azithromycin.  Should no indication for further antibiotics.  Shock physiology resolved     Small bilateral pleural effusion: No indication for thoracentesis at this time.     Advanced COPD with exacerbation: Patient remains markedly short of breath with rapid desaturation despite aggressive nebulizer regimen.  Continue steroids, prednisone 30 mg daily.  Twice daily Brovana, twice daily Pulmicort, every 6 hours DuoNeb     Severe anemia, acute on chronic anemia: S/p transfusion with 1 unit of PRBCs on 12/04/2022.  Hemoglobin improved from 6.9 to 9.6.       Acute on chronic hypoxemic respiratory failure: She is on 3 L/min oxygen via [place].  ABG showed pH 7.45, pCO2 47, pO2 56, O2 sat 91% on FiO2 of 32%     Chronic diastolic CHF: S/p treatment with IV Lasix.  Doubt CHF exacerbation at this time.  Continue oral Lasix for  now. BNP 591.6 but patient has had chronically elevated BNP for some time now.     Acute urinary retention: In and out urethral catheterization as needed.     Anxiety: Xanax as needed for anxiety.     Paroxysmal atrial fibrillation: Continue amiodarone and metoprolol.     AKI: Improved.  Monitor BMP.     Elevated liver enzymes: May be due to sepsis.  Liver enzymes are better.  She also has history of elevated liver enzymes.  Of note patient is status post cholecystectomy.  Monitor liver enzymes.     CAD: Continue aspirin, Plavix and statin     Chronic hyponatremia: Sodium level is stable.   DVT prophylaxis: SQ Lovenox Code Status: DNR Family Communication: Left VM for daughter [name] [phone] on 10/2 Disposition Plan: Status is: Inpatient Remains inpatient appropriate because: Severe COPD exacerbation   Level of care: Telemetry Medical  Consultants:  Palliative care  Procedures:  None  Antimicrobials: None   Subjective: Seen and examined.  Resting in bed.  Reports persistent shortness of breath.  Objective: Vitals:   12/07/22 0755 12/07/22 1009 12/07/22 1045 12/07/22 1105  BP: (!) 193/61 (!) 189/56 (!) 174/51 (!) 175/46  Pulse: 76 83 82 86  Resp: 16   16  Temp:      TempSrc:      SpO2: 99% 100% 100% 98%  Weight:      Height:        Intake/Output Summary (Last 24 hours) at 12/07/2022 1321 Last data filed at 12/07/2022 1234 Gross per  24 hour  Intake 240 ml  Output 850 ml  Net -610 ml   Filed Weights   11/30/22 1111  Weight: 51.7 kg    Examination:  General exam: Appears calm and comfortable  Respiratory system: No wheeze.  Normal work of breathing.  Diminished breath sounds.  4 L Cardiovascular system: S1-S2, RRR, no murmurs, no pedal edema Gastrointestinal system: Soft, NT/ND, normal bowel sounds Central nervous system: Alert and oriented. No focal neurological deficits. Extremities: Symmetric 5 x 5 power. Skin: No rashes, lesions or  ulcers Psychiatry: Judgement and insight appear normal. Mood & affect appropriate.     Data Reviewed: I have personally reviewed following labs and imaging studies  CBC: Recent Labs  Lab 12/01/22 0627 12/02/22 0355 12/03/22 0426 12/04/22 0346 12/04/22 0651 12/05/22 0258 12/07/22 0502  WBC 14.8* 13.7* 11.7* 6.8  --  8.2 8.7  NEUTROABS 12.9* 12.1* 9.0*  --   --   --  6.7  HGB 7.5* 7.5* 8.1* 6.9* 7.0* 9.6* 9.5*  HCT 22.0* 22.6* 24.8* 20.2*  --  27.7* 28.7*  MCV 94.4 95.0 95.4 92.2  --  91.1 94.4  PLT 175 190 214 201  --  217 279   Basic Metabolic Panel: Recent Labs  Lab 12/02/22 0355 12/03/22 0426 12/04/22 0346 12/05/22 0258 12/07/22 0502  NA 131* 134* 132* 130* 136  K 3.9 3.6 3.5 3.5 4.3  CL 96* 97* 94* 91* 97*  CO2 29 29 30 30  32  GLUCOSE 124* 88 127* 128* 123*  BUN 21 19 18 19 20   CREATININE 0.63 0.72 0.76 0.78 0.78  CALCIUM 8.5* 8.5* 8.1* 8.1* 8.3*   GFR: Estimated Creatinine Clearance: 42.1 mL/min (by C-G formula based on SCr of 0.78 mg/dL). Liver Function Tests: Recent Labs  Lab 12/01/22 0627 12/02/22 0355 12/03/22 0426 12/05/22 0258  AST 64* 43* 42* 49*  ALT 55* 47* 46* 57*  ALKPHOS 64 56 67 58  BILITOT 0.3 0.4 0.4 0.6  PROT 6.1* 5.8* 6.3* 6.0*  ALBUMIN 2.6* 2.5* 2.7* 2.5*   No results for input(s): "LIPASE", "AMYLASE" in the last 168 hours. No results for input(s): "AMMONIA" in the last 168 hours. Coagulation Profile: No results for input(s): "INR", "PROTIME" in the last 168 hours. Cardiac Enzymes: No results for input(s): "CKTOTAL", "CKMB", "CKMBINDEX", "TROPONINI" in the last 168 hours. BNP (last 3 results) No results for input(s): "PROBNP" in the last 8760 hours. HbA1C: No results for input(s): "HGBA1C" in the last 72 hours. CBG: No results for input(s): "GLUCAP" in the last 168 hours. Lipid Profile: No results for input(s): "CHOL", "HDL", "LDLCALC", "TRIG", "CHOLHDL", "LDLDIRECT" in the last 72 hours. Thyroid Function Tests: No results  for input(s): "TSH", "T4TOTAL", "FREET4", "T3FREE", "THYROIDAB" in the last 72 hours. Anemia Panel: No results for input(s): "VITAMINB12", "FOLATE", "FERRITIN", "TIBC", "IRON", "RETICCTPCT" in the last 72 hours. Sepsis Labs: Recent Labs  Lab 11/30/22 1447 11/30/22 1916 12/02/22 0355  PROCALCITON  --   --  2.59  LATICACIDVEN 3.9* 4.5* 0.6    Recent Results (from the past 240 hour(s))  SARS Coronavirus 2 by RT PCR (hospital order, performed in [facility] hospital lab) *cepheid single result test* Anterior Nasal Swab     Status: None   Collection Time: 11/30/22 12:05 PM   Specimen: Anterior Nasal Swab  Result Value Ref Range Status   SARS Coronavirus 2 by RT PCR NEGATIVE NEGATIVE Final    Comment: (NOTE) SARS-CoV-2 target nucleic acids are NOT DETECTED.  The SARS-CoV-2 RNA is generally  detectable in upper and lower respiratory specimens during the acute phase of infection. The lowest concentration of SARS-CoV-2 viral copies this assay can detect is 250 copies / mL. A negative result does not preclude SARS-CoV-2 infection and should not be used as the sole basis for treatment or other patient management decisions.  A negative result may occur with improper specimen collection / handling, submission of specimen other than nasopharyngeal swab, presence of viral mutation(s) within the areas targeted by this assay, and inadequate number of viral copies (<250 copies / mL). A negative result must be combined with clinical observations, patient history, and epidemiological information.  Fact Sheet for Patients:   RoadLapTop.co.za  Fact Sheet for Healthcare Providers: http://kim-miller.com/  This test is not yet approved or  cleared by the [place] FDA and has been authorized for detection and/or diagnosis of SARS-CoV-2 by FDA under an Emergency Use Authorization (EUA).  This EUA will remain in effect (meaning this test can be used) for  the duration of the COVID-19 declaration under Section 564(b)(1) of the Act, 21 U.S.C. section 360bbb-3(b)(1), unless the authorization is terminated or revoked sooner.  Performed at [facility], [address] [place], [place] [address]   Blood culture (routine x 2)     Status: None   Collection Time: 11/30/22 12:58 PM   Specimen: BLOOD  Result Value Ref Range Status   Specimen Description BLOOD BLOOD RIGHT HAND  Final   Special Requests   Final    BOTTLES DRAWN AEROBIC AND ANAEROBIC Blood Culture results may not be optimal due to an excessive volume of blood received in culture bottles   Culture   Final    NO GROWTH 5 DAYS Performed at [facility], [address] [place], [place] [address]    Report Status 12/05/2022 FINAL  Final  Blood culture (routine x 2)     Status: None   Collection Time: 11/30/22  1:06 PM   Specimen: BLOOD  Result Value Ref Range Status   Specimen Description BLOOD BLOOD RIGHT HAND  Final   Special Requests   Final    BOTTLES DRAWN AEROBIC AND ANAEROBIC Blood Culture adequate volume   Culture   Final    NO GROWTH 5 DAYS Performed at [facility], [address] [place], [place] [address]    Report Status 12/05/2022 FINAL  Final  MRSA Next Gen by PCR, Nasal     Status: None   Collection Time: 12/03/22  4:15 AM   Specimen: Nasal Mucosa; Nasal Swab  Result Value Ref Range Status   MRSA by PCR Next Gen NOT DETECTED NOT DETECTED Final    Comment: (NOTE) The GeneXpert MRSA Assay (FDA approved for NASAL specimens only), is one component of a comprehensive MRSA colonization surveillance program. It is not intended to diagnose MRSA infection nor to guide or monitor treatment for MRSA infections. Test performance is not FDA approved in patients less than 18 years old. Performed at [facility], [address], [place], [place] [address]          Radiology Studies: No results found.      Scheduled  Meds:  sodium chloride   Intravenous Once   amiodarone  200 mg Oral Daily   arformoterol  15 mcg Nebulization BID   aspirin EC  81 mg Oral Daily   atorvastatin  40 mg Oral Q supper   budesonide (PULMICORT) nebulizer solution  0.25 mg Nebulization BID   clopidogrel  75 mg  Oral Daily   enoxaparin (LOVENOX) injection  40 mg Subcutaneous Q24H   feeding supplement  237 mL Oral BID BM   ferrous sulfate  325 mg Oral Q breakfast   hydrALAZINE  25 mg Oral BID   ipratropium-albuterol  3 mL Nebulization QID   isosorbide mononitrate  60 mg Oral Daily   metoprolol succinate  50 mg Oral Daily   mirtazapine  45 mg Oral QHS   pantoprazole  40 mg Oral BID   predniSONE  30 mg Oral Q breakfast   QUEtiapine  100 mg Oral QHS   Continuous Infusions:   LOS: 7 days     Tresa Moore, MD [facility]   If 7PM-7AM, please contact night-coverage  12/07/2022, 1:21 PM

## 2022-12-08 DIAGNOSIS — A419 Sepsis, unspecified organism: Secondary | ICD-10-CM | POA: Diagnosis not present

## 2022-12-08 DIAGNOSIS — Z7189 Other specified counseling: Secondary | ICD-10-CM | POA: Diagnosis not present

## 2022-12-08 DIAGNOSIS — R6521 Severe sepsis with septic shock: Secondary | ICD-10-CM | POA: Diagnosis not present

## 2022-12-08 MED ORDER — GUAIFENESIN ER 600 MG PO TB12
600.0000 mg | ORAL_TABLET | Freq: Two times a day (BID) | ORAL | Status: DC
  Administered 2022-12-08 – 2022-12-09 (×2): 600 mg via ORAL
  Filled 2022-12-08 (×2): qty 1

## 2022-12-08 MED ORDER — HYDRALAZINE HCL 20 MG/ML IJ SOLN
10.0000 mg | Freq: Four times a day (QID) | INTRAMUSCULAR | Status: DC | PRN
  Administered 2022-12-08: 10 mg via INTRAVENOUS
  Filled 2022-12-08: qty 1

## 2022-12-08 MED ORDER — ACETYLCYSTEINE 20 % IN SOLN
3.0000 mL | Freq: Every day | RESPIRATORY_TRACT | Status: DC
  Filled 2022-12-08: qty 4

## 2022-12-08 MED ORDER — LABETALOL HCL 5 MG/ML IV SOLN
20.0000 mg | INTRAVENOUS | Status: DC | PRN
  Administered 2022-12-09: 20 mg via INTRAVENOUS
  Filled 2022-12-08: qty 4

## 2022-12-08 MED ORDER — PHENAZOPYRIDINE HCL 100 MG PO TABS
100.0000 mg | ORAL_TABLET | Freq: Three times a day (TID) | ORAL | Status: DC
  Administered 2022-12-08 – 2022-12-09 (×3): 100 mg via ORAL
  Filled 2022-12-08 (×4): qty 1

## 2022-12-08 MED ORDER — PREDNISONE 20 MG PO TABS
20.0000 mg | ORAL_TABLET | Freq: Every day | ORAL | Status: DC
  Administered 2022-12-08 – 2022-12-09 (×2): 20 mg via ORAL
  Filled 2022-12-08 (×2): qty 1

## 2022-12-08 MED ORDER — LOSARTAN POTASSIUM 25 MG PO TABS
25.0000 mg | ORAL_TABLET | Freq: Every day | ORAL | Status: DC
  Administered 2022-12-08 – 2022-12-09 (×2): 25 mg via ORAL
  Filled 2022-12-08 (×2): qty 1

## 2022-12-08 MED ORDER — AZITHROMYCIN 250 MG PO TABS
250.0000 mg | ORAL_TABLET | Freq: Every day | ORAL | Status: DC
  Administered 2022-12-08 – 2022-12-09 (×2): 250 mg via ORAL
  Filled 2022-12-08 (×2): qty 1

## 2022-12-08 MED ORDER — IPRATROPIUM-ALBUTEROL 0.5-2.5 (3) MG/3ML IN SOLN
3.0000 mL | Freq: Three times a day (TID) | RESPIRATORY_TRACT | Status: DC
  Administered 2022-12-08 – 2022-12-09 (×4): 3 mL via RESPIRATORY_TRACT
  Filled 2022-12-08 (×3): qty 3

## 2022-12-08 NOTE — Progress Notes (Signed)
PT Cancellation Note  Patient Details Name: Kristi Clark MRN: [id number] DOB: 1950/05/05   Cancelled Treatment:     Pt meeting with Palliative Care due to current state of health and verbalizing to care team desire to pursue Hospice. Will re-attempt next available date/time as appropriate and per team recommendations.    Jannet Askew 12/08/2022, 3:34 PM

## 2022-12-08 NOTE — Progress Notes (Signed)
OT Cancellation Note  Patient Details Name: Kristi Clark MRN: [id number] DOB: [birth year]   Cancelled Treatment:    Reason Eval/Treat Not Completed: Patient at procedure or test/ unavailable. On arrival pt meeting with palliative team, unavailable for therapy at this time. Will re-attempt as able.   Kathie Dike, M.S. OTR/L  12/08/22, 3:00 PM  ascom [phone]

## 2022-12-08 NOTE — Plan of Care (Signed)

## 2022-12-08 NOTE — Progress Notes (Signed)
PROGRESS NOTE    Kristi Clark  ZOX:[id number] DOB: October 27, 1950 DOA: 11/30/2022 PCP: Dorcas Carrow, DO    Brief Narrative:   Kristi Clark is a 72 y.o. 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  She also complained of subjective fever and sweating at night.    Assessment & Plan:   Principal Problem:   Septic shock (HCC) Active Problems:   CAP (community acquired pneumonia)   Pleural effusion on right   COPD with acute exacerbation (HCC)   Chronic respiratory failure with hypoxia (HCC)   AKI (acute kidney injury) (HCC)   Essential hypertension   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 (HCC)   Paroxysmal A-fib (HCC)   History of CAD (coronary artery disease)   Elevated LFTs  Septic shock secondary to community-acquired multifocal pneumonia: Completed course of treatment with IV cefepime and azithromycin.  No further indication for antibiotics.  Shock physiology resolved.     Small bilateral pleural effusion: No indication for thoracentesis at this time.     Advanced COPD with exacerbation: Patient remains markedly short of breath with rapid desaturation despite aggressive nebulizer regimen.  Wean steroids.  Prednisone 20 mg daily.  Continue twice daily Brovana, twice daily Pulmicort, every 6 hours DuoNeb.  Pulmonology consult requested.       Severe anemia, acute on chronic anemia: S/p transfusion with 1 unit of PRBCs on 12/04/2022.  Hemoglobin improved from 6.9 to 9.6.       Acute on chronic hypoxemic respiratory failure: She is on 3 L/min oxygen via [place].  ABG showed pH 7.45, pCO2 47, pO2 56, O2 sat 91% on FiO2 of 32%     Chronic diastolic CHF: S/p treatment with IV Lasix.  Doubt CHF exacerbation at this  time.  Continue oral Lasix for now. BNP 591.6 but patient has had chronically elevated BNP for some time now.     Acute urinary retention: In and out urethral catheterization as needed.     Anxiety: Xanax as needed for anxiety.     Paroxysmal atrial fibrillation: Continue amiodarone and metoprolol.     AKI: Improved.  Monitor BMP.     Elevated liver enzymes: May be due to sepsis.  Liver enzymes are better.  She also has history of elevated liver enzymes.  Of note patient is status post cholecystectomy.  Monitor liver enzymes.     CAD: Continue aspirin, Plavix and statin     Chronic hyponatremia: Sodium level is stable.   DVT prophylaxis: SQ Lovenox Code Status: DNR Family Communication: Daughter [name] bedside 10/3 Disposition Plan: Status is: Inpatient Remains inpatient appropriate because: Severe COPD exacerbation   Level of care: Telemetry Medical  Consultants:  Palliative care  Procedures:  None  Antimicrobials: None   Subjective: Seen and examined.  Continues to report fatigue and shortness of breath with any minimal exertion  Objective: Vitals:   12/08/22 0330 12/08/22 0505 12/08/22 0727 12/08/22 0728  BP: (!) 188/56 (!) 164/50  (!) 171/43  Pulse: 79 78  76  Resp: 18   16  Temp: 98.2 F (36.8 C)   98.2 F (36.8 C)  TempSrc: Oral   Oral  SpO2: 99%  97% 97%  Weight:      Height:        Intake/Output Summary (Last 24 hours)  at 12/08/2022 1316 Last data filed at 12/08/2022 0900 Gross per 24 hour  Intake 240 ml  Output --  Net 240 ml   Filed Weights   11/30/22 1111  Weight: 51.7 kg    Examination:  General exam: NAD.  Appears frail and fatigued Respiratory system: No wheeze.  Normal work of breathing.  Diminished air entry.  3 L Cardiovascular system: S1-S2, RRR, no murmurs, no pedal edema Gastrointestinal system: Soft, NT/ND, normal bowel sounds Central nervous system: Alert and oriented. No focal neurological deficits. Extremities:  Symmetric 5 x 5 power. Skin: No rashes, lesions or ulcers Psychiatry: Judgement and insight appear normal. Mood & affect appropriate.     Data Reviewed: I have personally reviewed following labs and imaging studies  CBC: Recent Labs  Lab 12/02/22 0355 12/03/22 0426 12/04/22 0346 12/04/22 0651 12/05/22 0258 12/07/22 0502  WBC 13.7* 11.7* 6.8  --  8.2 8.7  NEUTROABS 12.1* 9.0*  --   --   --  6.7  HGB 7.5* 8.1* 6.9* 7.0* 9.6* 9.5*  HCT 22.6* 24.8* 20.2*  --  27.7* 28.7*  MCV 95.0 95.4 92.2  --  91.1 94.4  PLT 190 214 201  --  217 279   Basic Metabolic Panel: Recent Labs  Lab 12/02/22 0355 12/03/22 0426 12/04/22 0346 12/05/22 0258 12/07/22 0502  NA 131* 134* 132* 130* 136  K 3.9 3.6 3.5 3.5 4.3  CL 96* 97* 94* 91* 97*  CO2 29 29 30 30  32  GLUCOSE 124* 88 127* 128* 123*  BUN 21 19 18 19 20   CREATININE 0.63 0.72 0.76 0.78 0.78  CALCIUM 8.5* 8.5* 8.1* 8.1* 8.3*   GFR: Estimated Creatinine Clearance: 42.1 mL/min (by C-G formula based on SCr of 0.78 mg/dL). Liver Function Tests: Recent Labs  Lab 12/02/22 0355 12/03/22 0426 12/05/22 0258  AST 43* 42* 49*  ALT 47* 46* 57*  ALKPHOS 56 67 58  BILITOT 0.4 0.4 0.6  PROT 5.8* 6.3* 6.0*  ALBUMIN 2.5* 2.7* 2.5*   No results for input(s): "LIPASE", "AMYLASE" in the last 168 hours. No results for input(s): "AMMONIA" in the last 168 hours. Coagulation Profile: No results for input(s): "INR", "PROTIME" in the last 168 hours. Cardiac Enzymes: No results for input(s): "CKTOTAL", "CKMB", "CKMBINDEX", "TROPONINI" in the last 168 hours. BNP (last 3 results) No results for input(s): "PROBNP" in the last 8760 hours. HbA1C: No results for input(s): "HGBA1C" in the last 72 hours. CBG: No results for input(s): "GLUCAP" in the last 168 hours. Lipid Profile: No results for input(s): "CHOL", "HDL", "LDLCALC", "TRIG", "CHOLHDL", "LDLDIRECT" in the last 72 hours. Thyroid Function Tests: No results for input(s): "TSH", "T4TOTAL",  "FREET4", "T3FREE", "THYROIDAB" in the last 72 hours. Anemia Panel: No results for input(s): "VITAMINB12", "FOLATE", "FERRITIN", "TIBC", "IRON", "RETICCTPCT" in the last 72 hours. Sepsis Labs: Recent Labs  Lab 12/02/22 0355  PROCALCITON 2.59  LATICACIDVEN 0.6    Recent Results (from the past 240 hour(s))  SARS Coronavirus 2 by RT PCR (hospital order, performed in [facility] hospital lab) *cepheid single result test* Anterior Nasal Swab     Status: None   Collection Time: 11/30/22 12:05 PM   Specimen: Anterior Nasal Swab  Result Value Ref Range Status   SARS Coronavirus 2 by RT PCR NEGATIVE NEGATIVE Final    Comment: (NOTE) SARS-CoV-2 target nucleic acids are NOT DETECTED.  The SARS-CoV-2 RNA is generally detectable in upper and lower respiratory specimens during the acute phase of infection. The lowest concentration of  SARS-CoV-2 viral copies this assay can detect is 250 copies / mL. A negative result does not preclude SARS-CoV-2 infection and should not be used as the sole basis for treatment or other patient management decisions.  A negative result may occur with improper specimen collection / handling, submission of specimen other than nasopharyngeal swab, presence of viral mutation(s) within the areas targeted by this assay, and inadequate number of viral copies (<250 copies / mL). A negative result must be combined with clinical observations, patient history, and epidemiological information.  Fact Sheet for Patients:   RoadLapTop.co.za  Fact Sheet for Healthcare Providers: http://kim-miller.com/  This test is not yet approved or  cleared by the [place] FDA and has been authorized for detection and/or diagnosis of SARS-CoV-2 by FDA under an Emergency Use Authorization (EUA).  This EUA will remain in effect (meaning this test can be used) for the duration of the COVID-19 declaration under Section 564(b)(1) of the Act, 21  U.S.C. section 360bbb-3(b)(1), unless the authorization is terminated or revoked sooner.  Performed at [facility], [address] [place], [place] [address]   Blood culture (routine x 2)     Status: None   Collection Time: 11/30/22 12:58 PM   Specimen: BLOOD  Result Value Ref Range Status   Specimen Description BLOOD BLOOD RIGHT HAND  Final   Special Requests   Final    BOTTLES DRAWN AEROBIC AND ANAEROBIC Blood Culture results may not be optimal due to an excessive volume of blood received in culture bottles   Culture   Final    NO GROWTH 5 DAYS Performed at [facility], [address], [place], [place] [address]    Report Status 12/05/2022 FINAL  Final  Blood culture (routine x 2)     Status: None   Collection Time: 11/30/22  1:06 PM   Specimen: BLOOD  Result Value Ref Range Status   Specimen Description BLOOD BLOOD RIGHT HAND  Final   Special Requests   Final    BOTTLES DRAWN AEROBIC AND ANAEROBIC Blood Culture adequate volume   Culture   Final    NO GROWTH 5 DAYS Performed at [facility], [address] [place], [place] [address]    Report Status 12/05/2022 FINAL  Final  MRSA Next Gen by PCR, Nasal     Status: None   Collection Time: 12/03/22  4:15 AM   Specimen: Nasal Mucosa; Nasal Swab  Result Value Ref Range Status   MRSA by PCR Next Gen NOT DETECTED NOT DETECTED Final    Comment: (NOTE) The GeneXpert MRSA Assay (FDA approved for NASAL specimens only), is one component of a comprehensive MRSA colonization surveillance program. It is not intended to diagnose MRSA infection nor to guide or monitor treatment for MRSA infections. Test performance is not FDA approved in patients less than 1 years old. Performed at [facility], [address] [place], [place] [address]          Radiology Studies: No results found.      Scheduled Meds:  amiodarone  200 mg Oral Daily   arformoterol  15 mcg Nebulization BID    aspirin EC  81 mg Oral Daily   atorvastatin  40 mg Oral Q supper   budesonide (PULMICORT) nebulizer solution  0.25 mg Nebulization BID   clopidogrel  75 mg Oral Daily   enoxaparin (LOVENOX) injection  40 mg Subcutaneous Q24H   feeding supplement  237 mL Oral BID BM   ferrous  sulfate  325 mg Oral Q breakfast   hydrALAZINE  25 mg Oral BID   ipratropium-albuterol  3 mL Nebulization TID   isosorbide mononitrate  60 mg Oral Daily   losartan  25 mg Oral Daily   metoprolol succinate  50 mg Oral Daily   mirtazapine  45 mg Oral QHS   pantoprazole  40 mg Oral BID   predniSONE  20 mg Oral Q breakfast   QUEtiapine  100 mg Oral QHS   Continuous Infusions:   LOS: 8 days     Tresa Moore, MD [facility]   If 7PM-7AM, please contact night-coverage  12/08/2022, 1:16 PM

## 2022-12-08 NOTE — Consult Note (Signed)
PULMONOLOGY         Date: 12/08/2022,   MRN# [id number] Kristi Clark [birth year]     AdmissionWeight: 51.7 kg                 CurrentWeight: 51.7 kg  Referring provider: Dr Georgeann Oppenheim   CHIEF COMPLAINT:   Acute on chronic hypoxemic respiratory failure   HISTORY OF PRESENT ILLNESS   Kristi Clark is a 72 y.o. female with medical history significant of end-stage COPD she has been smoking entire life and quit June 2024, chronic hypoxic respiratory failure on 3 L, atrial fibrillation not on AC, CAD, HFpEF, hypertension, hyperlipidemia, who presents to the ED due to shortness of breath.  Yesterday into the evening, she developed a productive cough and quickly worsening shortness of breath.  She denies any chest pain or palpitations, nausea vomiting, abdominal pain or urinary symptoms.  No lower extremity edema.  She notes that she feels very dehydrated.  On arrival to the ED, patient's blood pressure was 130/35 with heart rate of 82.  She was saturating at 91% on 3 L.  She was afebrile at 99.9.  Initial workup notable for WBC of 14.2, hemoglobin of 8.9, potassium 3.2, BUN 21, creatinine 1.02 with GFR of 58, AST 60, ALT 47.  Initial troponin 14 with increased to 18.  BNP 473.  Procalcitonin elevated 2.6.  Chest x-ray was obtained that demonstrated a right upper lobe opacity in addition to a pleural effusion.  PCCM for evaluation of refractory hypoxemia with multiorgan dysfunction.    PAST MEDICAL HISTORY   Past Medical History:  Diagnosis Date   AK (actinic keratosis) 10/25/2017   left forehead   Arrhythmia    atrial fibrillation   BCC (basal cell carcinoma) 10/25/2017   left upper lateral eyelid, Moh's   CHF (congestive heart failure) (HCC)    COPD (chronic obstructive pulmonary disease) (HCC)    Coronary artery disease    Depression    Dysrhythmia    GERD (gastroesophageal reflux disease)    Hyperlipidemia    Hypertension    Menopause    Osteopenia    Severe  sepsis (HCC) 07/14/2014   Tobacco abuse      SURGICAL HISTORY   Past Surgical History:  Procedure Laterality Date   CARDIAC CATHETERIZATION     CHOLECYSTECTOMY  2014   COLONOSCOPY WITH PROPOFOL N/A 01/16/2018   Procedure: COLONOSCOPY WITH PROPOFOL;  Surgeon: Christena Deem, MD;  Location[facility] ENDOSCOPY;  Service: Endoscopy;  Laterality: N/A;   CORONARY ANGIOPLASTY     ESOPHAGOGASTRODUODENOSCOPY (EGD) WITH PROPOFOL N/A 01/16/2018   Procedure: ESOPHAGOGASTRODUODENOSCOPY (EGD) WITH PROPOFOL;  Surgeon: Christena Deem, MD;  Location: [facility] ENDOSCOPY;  Service: Endoscopy;  Laterality: N/A;   heart stent     LEFT HEART CATH AND CORONARY ANGIOGRAPHY N/A 09/17/2021   Procedure: LEFT HEART CATH AND CORONARY ANGIOGRAPHY;  Surgeon: Laurier Nancy, MD;  Location: [facility] INVASIVE CV LAB;  Service: Cardiovascular;  Laterality: N/A;   TEE WITHOUT CARDIOVERSION N/A 05/11/2021   Procedure: TRANSESOPHAGEAL ECHOCARDIOGRAM (TEE);  Surgeon: Lamar Blinks, MD;  Location: [facility] ORS;  Service: Cardiovascular;  Laterality: N/A;   TOTAL ABDOMINAL HYSTERECTOMY       FAMILY HISTORY   Family History  Problem Relation Age of Onset   Cancer Mother    Stroke Mother    Heart disease Father    Hyperlipidemia Father    Cancer Sister    Diabetes Sister    Breast  cancer Sister 72   Diabetes Brother    Asthma Son    Cancer Son    Diabetes Daughter    Hypertension Daughter    Cancer Maternal Grandmother        gallbladder   Diabetes Brother    Heart disease Brother    Breast cancer Paternal Aunt      SOCIAL HISTORY   Social History   Tobacco Use   Smoking status: Former    Current packs/day: 1.00    Average packs/day: 1 pack/day for 56.0 years (56.0 ttl pk-yrs)    Types: Cigarettes   Smokeless tobacco: Never  Vaping Use   Vaping status: Never Used  Substance Use Topics   Alcohol use: No   Drug use: Never     MEDICATIONS    Home Medication:    Current Medication:  Current  Facility-Administered Medications:    acetaminophen (TYLENOL) tablet 650 mg, 650 mg, Oral, Q6H PRN, Verdene Lennert, MD, 650 mg at 12/06/22 0839   albuterol (PROVENTIL) (2.5 MG/3ML) 0.083% nebulizer solution 2.5 mg, 2.5 mg, Nebulization, Q2H PRN, Verdene Lennert, MD, 2.5 mg at 12/03/22 0513   ALPRAZolam (XANAX) tablet 0.25 mg, 0.25 mg, Oral, BID PRN, Lurene Shadow, MD, 0.25 mg at 12/08/22 0837   amiodarone (PACERONE) tablet 200 mg, 200 mg, Oral, Daily, Verdene Lennert, MD, 200 mg at 12/08/22 0837   arformoterol (BROVANA) nebulizer solution 15 mcg, 15 mcg, Nebulization, BID, Sreenath, Sudheer B, MD, 15 mcg at 12/08/22 0726   aspirin EC tablet 81 mg, 81 mg, Oral, Daily, Hunt, [place] H, RPH, 81 mg at 12/08/22 8119   atorvastatin (LIPITOR) tablet 40 mg, 40 mg, Oral, Q supper, Verdene Lennert, MD, 40 mg at 12/07/22 1654   budesonide (PULMICORT) nebulizer solution 0.25 mg, 0.25 mg, Nebulization, BID, Verdene Lennert, MD, 0.25 mg at 12/08/22 0726   clopidogrel (PLAVIX) tablet 75 mg, 75 mg, Oral, Daily, Verdene Lennert, MD, 75 mg at 12/08/22 0834   enoxaparin (LOVENOX) injection 40 mg, 40 mg, Subcutaneous, Q24H, Hunt, [place] H, RPH, 40 mg at 12/07/22 2133   feeding supplement (ENSURE ENLIVE / ENSURE PLUS) liquid 237 mL, 237 mL, Oral, BID BM, Lurene Shadow, MD, 237 mL at 12/08/22 1478   ferrous sulfate tablet 325 mg, 325 mg, Oral, Q breakfast, Verdene Lennert, MD, 325 mg at 12/08/22 0834   hydrALAZINE (APRESOLINE) injection 10 mg, 10 mg, Intravenous, Q6H PRN, Mansy, Jan A, MD, 10 mg at 12/08/22 2956   hydrALAZINE (APRESOLINE) tablet 25 mg, 25 mg, Oral, BID, Verdene Lennert, MD, 25 mg at 12/08/22 2130   ipratropium-albuterol (DUONEB) 0.5-2.5 (3) MG/3ML nebulizer solution 3 mL, 3 mL, Nebulization, TID, Sreenath, Sudheer B, MD, 3 mL at 12/08/22 1334   isosorbide mononitrate (IMDUR) 24 hr tablet 60 mg, 60 mg, Oral, Daily, Verdene Lennert, MD, 60 mg at 12/08/22 0837   labetalol (NORMODYNE) injection 20 mg, 20  mg, Intravenous, Q3H PRN, Mansy, Jan A, MD   losartan (COZAAR) tablet 25 mg, 25 mg, Oral, Daily, Sreenath, Sudheer B, MD, 25 mg at 12/08/22 0837   metoprolol succinate (TOPROL-XL) 24 hr tablet 50 mg, 50 mg, Oral, Daily, Verdene Lennert, MD, 50 mg at 12/08/22 0834   mirtazapine (REMERON) tablet 45 mg, 45 mg, Oral, QHS, Verdene Lennert, MD, 45 mg at 12/07/22 2134   morphine (PF) 2 MG/ML injection 2 mg, 2 mg, Intravenous, Q3H PRN, Sreenath, Sudheer B, MD   oxyCODONE (Oxy IR/ROXICODONE) immediate release tablet 5-10 mg, 5-10 mg, Oral, Q4H PRN, Tresa Moore, MD,  5 mg at 12/07/22 1506   pantoprazole (PROTONIX) EC tablet 40 mg, 40 mg, Oral, BID, Verdene Lennert, MD, 40 mg at 12/08/22 0835   polyethylene glycol powder (GLYCOLAX/MIRALAX) container 17 g, 17 g, Oral, TID PRN, Verdene Lennert, MD   predniSONE (DELTASONE) tablet 20 mg, 20 mg, Oral, Q breakfast, Sreenath, Sudheer B, MD, 20 mg at 12/08/22 4098   QUEtiapine (SEROQUEL XR) 24 hr tablet 100 mg, 100 mg, Oral, QHS, Verdene Lennert, MD, 100 mg at 12/07/22 2134   sodium chloride (OCEAN) 0.65 % nasal spray 1 spray, 1 spray, Each Nare, PRN, Lurene Shadow, MD, 1 spray at 12/01/22 1034    ALLERGIES   Clindamycin/lincomycin, Prednisone, Amoxicillin, Avelox [moxifloxacin hcl in nacl], Codeine sulfate, Penicillins, and Sulfa antibiotics     REVIEW OF SYSTEMS    Review of Systems:  Gen:  Denies  fever, sweats, chills weigh loss  HEENT: Denies blurred vision, double vision, ear pain, eye pain, hearing loss, nose bleeds, sore throat Cardiac:  No dizziness, chest pain or heaviness, chest tightness,edema Resp:   reports dyspnea chronically  Gi: Denies swallowing difficulty, stomach pain, nausea or vomiting, diarrhea, constipation, bowel incontinence Gu:  Denies bladder incontinence, burning urine Ext:   Denies Joint pain, stiffness or swelling Skin: Denies  skin rash, easy bruising or bleeding or hives Endoc:  Denies polyuria, polydipsia ,  polyphagia or weight change Psych:   Denies depression, insomnia or hallucinations   Other:  All other systems negative   VS: BP (!) 171/43 (BP Location: Right Arm)   Pulse 76   Temp 98.2 F (36.8 C) (Oral)   Resp 16   Ht 4\' 11"  (1.499 m)   Wt 51.7 kg   LMP  (LMP Unknown)   SpO2 97%   BMI 23.03 kg/m      PHYSICAL EXAM    GENERAL:NAD, no fevers, chills, no weakness no fatigue HEAD: Normocephalic, atraumatic.  EYES: Pupils equal, round, reactive to light. Extraocular muscles intact. No scleral icterus.  MOUTH: Moist mucosal membrane. Dentition intact. No abscess noted.  EAR, NOSE, THROAT: Clear without exudates. No external lesions.  NECK: Supple. No thyromegaly. No nodules. No JVD.  PULMONARY: decreased breath sounds with mild rhonchi worse at bases bilaterally.  CARDIOVASCULAR: S1 and S2. Regular rate and rhythm. No murmurs, rubs, or gallops. No edema. Pedal pulses 2+ bilaterally.  GASTROINTESTINAL: Soft, nontender, nondistended. No masses. Positive bowel sounds. No hepatosplenomegaly.  MUSCULOSKELETAL: No swelling, clubbing, or edema. Range of motion full in all extremities.  NEUROLOGIC: Cranial nerves II through XII are intact. No gross focal neurological deficits. Sensation intact. Reflexes intact.  SKIN: No ulceration, lesions, rashes, or cyanosis. Skin warm and dry. Turgor intact.  PSYCHIATRIC: Mood, affect within normal limits. The patient is awake, alert and oriented x 3. Insight, judgment intact.       IMAGING     ASSESSMENT/PLAN   Acute on chronic hypoxemic respiratory failure -Currently able to speak in full sentences but reports chronic decline in health with recurrent falling and dependence on other for ADLs. -patient is on prednisone 20mg  daily  -will add zithromax 250mg  daily -she has mucus plugging bilaterally and would benefit from vest therapy/metaneb/flutter with chest physiotherapy -she has advanced COPD/emphysema -hospice is appropriate if she  is interested -patient shares "im ready to go I can't go on like this"  -will add mucomyst once daily  -PT/OT -palliative care evaluation            Thank you for allowing me to participate  in the care of this patient.   Patient/Family are satisfied with care plan and all questions have been answered.    Provider disclosure: Patient with at least one acute or chronic illness or injury that poses a threat to life or bodily function and is being managed actively during this encounter.  All of the below services have been performed independently by signing provider:  review of prior documentation from internal and or external health records.  Review of previous and current lab results.  Interview and comprehensive assessment during patient visit today. Review of current and previous chest radiographs/CT scans. Discussion of management and test interpretation with health care team and patient/family.   This document was prepared using Dragon voice recognition software and may include unintentional dictation errors.     Vida Rigger, M.D.  Division of Pulmonary & Critical Care Medicine

## 2022-12-08 NOTE — TOC Progression Note (Signed)
Transition of Care [facility]) - Progression Note    Patient Details  Name: Kristi Clark MRN: [id number] Date of Birth: Jun 27, 1950  [facility] CM/SW Contact  Chapman Fitch, RN Phone Number: 12/08/2022, 9:43 AM  Clinical Narrative:     Plans remains for patient to return home with home health services at time of discharge through [facility]   Patient currently on 3L O2 which is her home liter flow   Expected Discharge Plan: Home w Home Health Services    Expected Discharge Plan and Services                                               Social Determinants of Health (SDOH) Interventions SDOH Screenings   Food Insecurity: No Food Insecurity (11/30/2022)  Housing: Low Risk  (11/30/2022)  Transportation Needs: No Transportation Needs (11/30/2022)  Utilities: Not At Risk (11/30/2022)  Alcohol Screen: Low Risk  (06/13/2022)  Depression (PHQ2-9): Low Risk  (11/25/2022)  Financial Resource Strain: Low Risk  (06/13/2022)  Physical Activity: Insufficiently Active (06/13/2022)  Social Connections: Socially Isolated (06/13/2022)  Stress: No Stress Concern Present (06/13/2022)  Tobacco Use: Medium Risk (11/30/2022)    Readmission Risk Interventions    12/01/2022   11:35 AM 03/06/2022   10:27 AM  Readmission Risk Prevention Plan  Transportation Screening Complete Complete  PCP or Specialist Appt within 3-5 Days  Complete  HRI or Home Care Consult Complete Complete  Social Work Consult for Recovery Care Planning/Counseling Complete Complete  Palliative Care Screening Not Applicable Not Applicable  Medication Review Oceanographer) Complete Complete

## 2022-12-08 NOTE — Progress Notes (Signed)
[facility] Liaison Note   Received request from [facility], Bevelyn Ngo, RN to meet with patient at bedside to explain hospice services at home and the [facility]. HL met patient at bedside and provided extensive education for hospice services.  Patent requested additional time to consider options before proceeding. All questions answered and no concerns voiced. She asked that HL call her daughter to discuss.  Awaiting a return phone call from the daughter.    At this time, patient is not under review for [facility] services as family continues to have ongoing GOC discussions.     [facility] information and contact numbers given to family & above information shared with TOC.   Please call with any questions/concerns.    Thank you for the opportunity to participate in this patient's care.  [facility] Liaison [phone]

## 2022-12-08 NOTE — Consult Note (Signed)
Consultation Note Date: 12/08/2022   Patient Name: Kristi Clark  DOB: 1950-04-04  MRN: [id number]  Age / Sex: 72 y.o., female  PCP: Dorcas Carrow, DO Referring Physician: Tresa Moore, MD  Reason for Consultation: Establishing goals of care  HPI/Patient Profile: SABY SIMA is a 72 y.o. 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  She also complained of subjective fever and sweating at night.   Clinical Assessment and Goals of Care: Notes and labs reviewed.  Patient is known to me from a previous admission.  Patient states she moved in with her daughter in February.  She states she had been receiving home health PT/ OT but this has stopped.  She states since last November, she has had continued physical decline and weakness.  She states she has had significant shortness of breath that gives her significant activity intolerance.  She states she has a new 23-month-old great grandchild, and has another great grandchild that should be born around February 2025.  We discussed her diagnosis, prognosis, GOC, EOL wishes disposition and options.  Created space and opportunity for patient  to explore thoughts and feelings regarding current medical information.   A detailed discussion was had today regarding advanced directives.  Concepts specific to code status, artifical feeding and hydration, IV antibiotics and rehospitalization were discussed.  The difference between an aggressive medical intervention path and a comfort care path was discussed.  Values and goals of care important to patient and family were attempted to be elicited.  Discussed limitations of medical interventions  to prolong quality of life in some situations and discussed the concept of human mortality.  Patient states she would love to be here for her family and  the new babies.  With in-depth conversation, she states she is very tired and she is ready to go.  She states her quality of life has not been acceptable for a long time and continues to decline.  She states she has a decision to make of hospice versus rehab, and would like to choose the hospice route.  While at bedside patient appears short of breath and has coughing.  Discussed medications in place for symptoms.  Discussed prednisone for inflammation.  Patient is drinking large amounts of water which she states she was not doing prior to this admission.   She advises that living with her daughter she must go up stairs to get into the home, and in the case of an emergency she does not feel like she would be able to exit the home herself due to shortness of breath.  As per conversation would recommend hospice facility short-term with transition home when symptoms are optimized.    SUMMARY OF RECOMMENDATIONS   Recommend short-term hospice facility placement for symptom management optimization prior to discharge home.   Prognosis:  Poor  Primary Diagnoses: Present on Admission:  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 (HCC)  Chronic respiratory failure with hypoxia (HCC)  Paroxysmal A-fib (HCC)  CAP (community acquired pneumonia)  COPD with acute exacerbation (HCC)  Essential hypertension  Septic shock (HCC)   I have reviewed the medical record, interviewed the patient and family, and examined the patient. The following aspects are pertinent.  Past Medical History:  Diagnosis Date   AK (actinic keratosis) 10/25/2017   left forehead   Arrhythmia    atrial fibrillation   BCC (basal cell carcinoma) 10/25/2017   left upper lateral eyelid, Moh's   CHF (congestive heart failure) (HCC)     COPD (chronic obstructive pulmonary disease) (HCC)    Coronary artery disease    Depression    Dysrhythmia    GERD (gastroesophageal reflux disease)    Hyperlipidemia    Hypertension    Menopause    Osteopenia    Severe sepsis (HCC) 07/14/2014   Tobacco abuse    Social History   Socioeconomic History   Marital status: Widowed    Spouse name: Not on file   Number of children: 2   Years of education: Not on file   Highest education level: 10th grade  Occupational History   Occupation: retired  Tobacco Use   Smoking status: Former    Current packs/day: 1.00    Average packs/day: 1 pack/day for 56.0 years (56.0 ttl pk-yrs)    Types: Cigarettes   Smokeless tobacco: Never  Vaping Use   Vaping status: Never Used  Substance and Sexual Activity   Alcohol use: No   Drug use: Never   Sexual activity: Never  Other Topics Concern   Not on file  Social History Narrative   ** Merged History Encounter **       Lives with great grandson, Charise Killian   Social Determinants of Health   Financial Resource Strain: Low Risk  (06/13/2022)   Overall Financial Resource Strain (CARDIA)    Difficulty of Paying Living Expenses: Not very hard  Food Insecurity: No Food Insecurity (11/30/2022)   Hunger Vital Sign    Worried About Running Out of Food in the Last Year: Never true    Ran Out of Food in the Last Year: Never true  Transportation Needs: No Transportation Needs (11/30/2022)   PRAPARE - Administrator, Civil Service (Medical): No    Lack of Transportation (Non-Medical): No  Physical Activity: Insufficiently Active (06/13/2022)   Exercise Vital Sign    Days of Exercise per Week: 3 days    Minutes of Exercise per Session: 30 min  Stress: No Stress Concern Present (06/13/2022)   [employer] of Occupational Health - Occupational Stress Questionnaire    Feeling of Stress : Only a little  Social Connections: Socially Isolated (06/13/2022)   Social Connection and Isolation  Panel [NHANES]    Frequency of Communication with Friends and Family: More than three times a week    Frequency of Social Gatherings with Friends and Family: More than three times a week    Attends Religious Services: Never    Database administrator or Organizations: No    Attends Banker Meetings: Never    Marital Status: Widowed   Family History  Problem Relation Age of Onset   Cancer Mother    Stroke Mother    Heart disease Father    Hyperlipidemia Father    Cancer Sister    Diabetes Sister  Breast cancer Sister 17   Diabetes Brother    Asthma Son    Cancer Son    Diabetes Daughter    Hypertension Daughter    Cancer Maternal Grandmother        gallbladder   Diabetes Brother    Heart disease Brother    Breast cancer Paternal Aunt    Scheduled Meds:  acetylcysteine  3 mL Nebulization Daily   amiodarone  200 mg Oral Daily   arformoterol  15 mcg Nebulization BID   aspirin EC  81 mg Oral Daily   atorvastatin  40 mg Oral Q supper   azithromycin  250 mg Oral Daily   clopidogrel  75 mg Oral Daily   enoxaparin (LOVENOX) injection  40 mg Subcutaneous Q24H   feeding supplement  237 mL Oral BID BM   ferrous sulfate  325 mg Oral Q breakfast   guaiFENesin  600 mg Oral BID   hydrALAZINE  25 mg Oral BID   ipratropium-albuterol  3 mL Nebulization TID   isosorbide mononitrate  60 mg Oral Daily   losartan  25 mg Oral Daily   metoprolol succinate  50 mg Oral Daily   mirtazapine  45 mg Oral QHS   pantoprazole  40 mg Oral BID   predniSONE  20 mg Oral Q breakfast   QUEtiapine  100 mg Oral QHS   Continuous Infusions: PRN Meds:.acetaminophen, albuterol, ALPRAZolam, hydrALAZINE, labetalol, morphine injection, oxyCODONE, polyethylene glycol powder, sodium chloride Medications Prior to Admission:  Prior to Admission medications   Medication Sig Start Date End Date Taking? Authorizing Provider  albuterol (PROVENTIL) (2.5 MG/3ML) 0.083% nebulizer solution Take 3 mLs (2.5 mg  total) by nebulization every 4 (four) hours as needed for wheezing or shortness of breath. 08/25/22  Yes Johnson, Megan P, DO  albuterol (VENTOLIN HFA) 108 (90 Base) MCG/ACT inhaler Inhale 2 puffs into the lungs every 6 (six) hours as needed for wheezing or shortness of breath. 08/25/22  Yes Johnson, Megan P, DO  Aloe-Sodium Chloride (AYR SALINE NASAL GEL NA) Place 1 Application into the nose as needed (dry nares).   Yes [provider]  amiodarone (PACERONE) 200 MG tablet Take 1 tablet (200 mg total) by mouth daily. 10/03/22  Yes Scoggins, Amber, NP  amLODipine (NORVASC) 5 MG tablet Take 1 tablet (5 mg total) by mouth daily. 11/29/22  Yes Laurier Nancy, MD  aspirin 81 MG tablet Take 81 mg by mouth daily.   Yes [provider]  atorvastatin (LIPITOR) 40 MG tablet Take 1 tablet (40 mg total) by mouth daily. 08/25/22  Yes Johnson, Megan P, DO  cetirizine (ZYRTEC) 10 MG tablet Take 10 mg by mouth daily.   Yes [provider]  clopidogrel (PLAVIX) 75 MG tablet Take 1 tablet (75 mg total) by mouth daily. 08/25/22  Yes Johnson, Megan P, DO  ferrous sulfate 325 (65 FE) MG tablet Take 1 tablet (325 mg total) by mouth daily with breakfast. 03/09/22  Yes Esaw Grandchild A, DO  fluticasone (FLONASE) 50 MCG/ACT nasal spray Place 2 sprays into both nostrils daily. Patient taking differently: Place 2 sprays into both nostrils daily as needed. 11/10/22  Yes Mecum, Erin E, PA-C  Fluticasone-Umeclidin-Vilant (TRELEGY ELLIPTA) 100-62.5-25 MCG/ACT AEPB 100 mcg DAILY (route: inhalation) 08/25/22  Yes Johnson, Megan P, DO  furosemide (LASIX) 20 MG tablet AS needed 10/11/22  Yes Adrian Blackwater A, MD  hydrALAZINE (APRESOLINE) 25 MG tablet Take 1 tablet (25 mg total) by mouth 2 (two) times daily. 08/25/22  Yes Johnson, Megan P, DO  isosorbide mononitrate (IMDUR) 60 MG 24 hr tablet Take 1 tablet (60 mg total) by mouth 2 (two) times daily. Patient taking differently: Take 60 mg by mouth daily. 08/25/22  Yes  Johnson, Megan P, DO  losartan (COZAAR) 25 MG tablet Take 1 tablet (25 mg total) by mouth daily. 08/25/22  Yes Johnson, Megan P, DO  metoprolol succinate (TOPROL-XL) 50 MG 24 hr tablet Take 1 tablet (50 mg total) by mouth daily. Take with or immediately following a meal. 08/25/22  Yes Johnson, Megan P, DO  mirtazapine (REMERON) 45 MG tablet Take 1 tablet (45 mg total) by mouth at bedtime. 08/25/22  Yes Johnson, Megan P, DO  Multiple Vitamin (MULTIVITAMIN WITH MINERALS) TABS tablet Take 1 tablet by mouth daily. 04/22/22  Yes Darlin Priestly, MD  pantoprazole (PROTONIX) 40 MG tablet Take 1 tablet (40 mg total) by mouth 2 (two) times daily. 08/25/22 08/25/23 Yes Johnson, Megan P, DO  polyethylene glycol powder (GLYCOLAX/MIRALAX) 17 GM/SCOOP powder Take 17 g by mouth 3 (three) times daily. Patient taking differently: Take 17 g by mouth daily as needed. 03/03/22  Yes Johnson, Megan P, DO  QUEtiapine (SEROQUEL XR) 50 MG TB24 24 hr tablet Take 2 tablets (100 mg total) by mouth at bedtime. 08/25/22  Yes Johnson, Megan P, DO  Blood Pressure Monitoring (BLOOD PRESSURE MONITOR/WRIST) KIT Take Blood pressure as needed, Dx: I12.9 12/09/20   Olevia Perches P, DO   Allergies  Allergen Reactions   Clindamycin/Lincomycin Hives   Prednisone Other (See Comments)    Psychosis   Amoxicillin Rash   Avelox [Moxifloxacin Hcl In Nacl] Rash   Codeine Sulfate Nausea Only   Penicillins Rash   Sulfa Antibiotics Rash   Review of Systems  Constitutional:  Positive for fatigue.  Respiratory:  Positive for shortness of breath.     Physical Exam Pulmonary:     Comments: Intermittent nonproductive cough Neurological:     Mental Status: She is alert.     Vital Signs: BP (!) 171/43 (BP Location: Right Arm)   Pulse 76   Temp 98.2 F (36.8 C) (Oral)   Resp 16   Ht 4\' 11"  (1.499 m)   Wt 51.7 kg   LMP  (LMP Unknown)   SpO2 97%   BMI 23.03 kg/m  Pain Scale: 0-10   Pain Score: 0-No pain   SpO2: SpO2: 97 % O2 Device:SpO2:  97 % O2 Flow Rate: .O2 Flow Rate (L/min): 3 L/min  IO: Intake/output summary:  Intake/Output Summary (Last 24 hours) at 12/08/2022 1513 Last data filed at 12/08/2022 0900 Gross per 24 hour  Intake 240 ml  Output --  Net 240 ml    LBM: Last BM Date : 12/06/22 Baseline Weight: Weight: 51.7 kg Most recent weight: Weight: 51.7 kg        Signed by: Morton Stall, NP   Please contact Palliative Medicine Team phone at [phone] for questions and concerns.  For individual provider: See Loretha Stapler

## 2022-12-09 DIAGNOSIS — R6521 Severe sepsis with septic shock: Secondary | ICD-10-CM | POA: Diagnosis not present

## 2022-12-09 DIAGNOSIS — A419 Sepsis, unspecified organism: Secondary | ICD-10-CM | POA: Diagnosis not present

## 2022-12-09 DIAGNOSIS — Z7189 Other specified counseling: Secondary | ICD-10-CM | POA: Diagnosis not present

## 2022-12-09 LAB — C-REACTIVE PROTEIN: CRP: 2.5 mg/dL — ABNORMAL HIGH (ref <1.0)

## 2022-12-09 MED ORDER — TAMSULOSIN HCL 0.4 MG PO CAPS
0.4000 mg | ORAL_CAPSULE | Freq: Every day | ORAL | Status: DC
  Administered 2022-12-09: 0.4 mg via ORAL
  Filled 2022-12-09: qty 1

## 2022-12-09 MED ORDER — PHENAZOPYRIDINE HCL 100 MG PO TABS: 100.0000 mg | ORAL_TABLET | Freq: Three times a day (TID) | ORAL | 0 refills | Status: DC

## 2022-12-09 MED ORDER — OXYCODONE HCL 5 MG PO TABS: 5.0000 mg | ORAL_TABLET | ORAL | 0 refills | Status: DC | PRN

## 2022-12-09 MED ORDER — AMLODIPINE BESYLATE 5 MG PO TABS
5.0000 mg | ORAL_TABLET | Freq: Every day | ORAL | Status: DC
  Administered 2022-12-09: 5 mg via ORAL
  Filled 2022-12-09: qty 1

## 2022-12-09 MED ORDER — ALPRAZOLAM 0.25 MG PO TABS: 0.2500 mg | ORAL_TABLET | Freq: Two times a day (BID) | ORAL | 0 refills | Status: DC | PRN

## 2022-12-09 MED ORDER — PREDNISONE 20 MG PO TABS: 20.0000 mg | ORAL_TABLET | Freq: Every day | ORAL | 0 refills | Status: AC

## 2022-12-09 MED ORDER — TAMSULOSIN HCL 0.4 MG PO CAPS: 0.4000 mg | ORAL_CAPSULE | Freq: Every day | ORAL | 0 refills | Status: DC

## 2022-12-09 MED ORDER — GUAIFENESIN ER 600 MG PO TB12: 600.0000 mg | ORAL_TABLET | Freq: Two times a day (BID) | ORAL | 0 refills | Status: DC

## 2022-12-09 NOTE — Discharge Summary (Signed)
Physician Discharge Summary  SEBELLA CHAWLA AOZ:[id number] DOB: [birth year] DOA: 11/30/2022  PCP: Dorcas Carrow, DO  Admit date: 11/30/2022 Discharge date: 12/09/2022  Admitted From: Home Disposition:  Home with hospice  Recommendations for Outpatient Follow-up:  Per hospice provider   Home Health:No  Equipment/Devices:Oxygen 3-4L via [facility]   Discharge Condition:Hospice  CODE STATUS:DNR  Diet recommendation: Comfort/regular  Brief/Interim Summary:  CLARABEL RASKIN is a 72 y.o. 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  She also complained of subjective fever and sweating at night.    Patient improved from a sepsis standpoint.  Completed 7-day course of antibiotics.  Patient remained markedly short of breath and desaturation with any exertion.  Event COPD.  Met with palliative care, pulmonology, hospice liaison.  After discussion with the family decision made to discharge patient home with hospice services.  Discharge Diagnoses:  Principal Problem:   Septic shock (HCC) Active Problems:   CAP (community acquired pneumonia)   Pleural effusion on right   COPD with acute exacerbation (HCC)   Chronic respiratory failure with hypoxia (HCC)   AKI (acute kidney injury) (HCC)   Essential hypertension   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 (HCC)   Paroxysmal A-fib (HCC)   History of CAD (coronary artery disease)   Elevated LFTs  Patient's completed a course of antibiotics.  No antibiotics indicated on discharge.  Patient will discharge on a nebulizer regimen.  Care to be assumed by hospice attending on discharge.  Will resume home nebulizer regimen and prescribe short course of steroids.  Decision regarding continuation of  this medication deferred to hospice MD.  I have sent a supply of oxycodone and alprazolam for symptom control post DC.  Again defer continuation or change of these medications to accepting hospice attending.   Discharge Instructions  Discharge Instructions     Diet - low sodium heart healthy   Complete by: As directed    Face-to-face encounter (required for Medicare/Medicaid patients)   Complete by: As directed    I BERNARD AYIKU certify that this patient is under my care and that I, or a nurse practitioner or physician's assistant working with me, had a face-to-face encounter that meets the physician face-to-face encounter requirements with this patient on 12/01/2022. The encounter with the patient was in whole, or in part for the following medical condition(s) which is the primary reason for home health care (List medical condition): Severe COPD   The encounter with the patient was in whole, or in part, for the following medical condition, which is the primary reason for home health care: Severe COPD   I certify that, based on my findings, the following services are medically necessary home health services:  Physical therapy Nursing     Reason for Medically Necessary Home Health Services:  Skilled Nursing- Skilled Assessment/Observation Therapy- Investment banker, operational, Teacher, early years/pre and Stair Training     My clinical findings support the need for the above services: Shortness of breath with activity   Further, I certify that my clinical findings support that this patient is homebound due to: Shortness of Breath with activity   Home Health   Complete by: As directed    To provide the following care/treatments:  PT OT RN     Increase activity slowly   Complete by: As directed  Allergies as of 12/09/2022       Reactions   Clindamycin/lincomycin Hives   Prednisone Other (See Comments)   Psychosis   Amoxicillin Rash   Avelox [moxifloxacin Hcl In Nacl] Rash   Codeine Sulfate Nausea  Only   Penicillins Rash   Sulfa Antibiotics Rash        Medication List     STOP taking these medications    amiodarone 200 MG tablet Commonly known as: PACERONE   amLODipine 5 MG tablet Commonly known as: NORVASC   aspirin 81 MG tablet   atorvastatin 40 MG tablet Commonly known as: LIPITOR   cetirizine 10 MG tablet Commonly known as: ZYRTEC   clopidogrel 75 MG tablet Commonly known as: PLAVIX   ferrous sulfate 325 (65 FE) MG tablet   hydrALAZINE 25 MG tablet Commonly known as: APRESOLINE   losartan 25 MG tablet Commonly known as: COZAAR   multivitamin with minerals Tabs tablet       TAKE these medications    albuterol (2.5 MG/3ML) 0.083% nebulizer solution Commonly known as: PROVENTIL Take 3 mLs (2.5 mg total) by nebulization every 4 (four) hours as needed for wheezing or shortness of breath.   albuterol 108 (90 Base) MCG/ACT inhaler Commonly known as: VENTOLIN HFA Inhale 2 puffs into the lungs every 6 (six) hours as needed for wheezing or shortness of breath.   ALPRAZolam 0.25 MG tablet Commonly known as: XANAX Take 1 tablet (0.25 mg total) by mouth 2 (two) times daily as needed for anxiety.   AYR SALINE NASAL GEL NA Place 1 Application into the nose as needed (dry nares).   Blood Pressure Monitor/Wrist Kit Take Blood pressure as needed, Dx: I12.9   fluticasone 50 MCG/ACT nasal spray Commonly known as: FLONASE Place 2 sprays into both nostrils daily. What changed:  when to take this reasons to take this   furosemide 20 MG tablet Commonly known as: LASIX AS needed   guaiFENesin 600 MG 12 hr tablet Commonly known as: MUCINEX Take 1 tablet (600 mg total) by mouth 2 (two) times daily.   isosorbide mononitrate 60 MG 24 hr tablet Commonly known as: IMDUR Take 1 tablet (60 mg total) by mouth 2 (two) times daily. What changed: when to take this   metoprolol succinate 50 MG 24 hr tablet Commonly known as: TOPROL-XL Take 1 tablet (50 mg  total) by mouth daily. Take with or immediately following a meal.   mirtazapine 45 MG tablet Commonly known as: REMERON Take 1 tablet (45 mg total) by mouth at bedtime.   oxyCODONE 5 MG immediate release tablet Commonly known as: Oxy IR/ROXICODONE Take 1-2 tablets (5-10 mg total) by mouth every 4 (four) hours as needed for moderate pain or severe pain.   pantoprazole 40 MG tablet Commonly known as: Protonix Take 1 tablet (40 mg total) by mouth 2 (two) times daily.   phenazopyridine 100 MG tablet Commonly known as: PYRIDIUM Take 1 tablet (100 mg total) by mouth 3 (three) times daily with meals.   polyethylene glycol powder 17 GM/SCOOP powder Commonly known as: GLYCOLAX/MIRALAX Take 17 g by mouth 3 (three) times daily. What changed:  when to take this reasons to take this   predniSONE 20 MG tablet Commonly known as: DELTASONE Take 1 tablet (20 mg total) by mouth daily with breakfast for 5 days. Start taking on: December 10, 2022   SEROquel XR 50 MG Tb24 24 hr tablet Generic drug: QUEtiapine Take 2 tablets (100 mg total) by mouth at  bedtime.   tamsulosin 0.4 MG Caps capsule Commonly known as: FLOMAX Take 1 capsule (0.4 mg total) by mouth daily.   Trelegy Ellipta 100-62.5-25 MCG/ACT Aepb Generic drug: Fluticasone-Umeclidin-Vilant 100 mcg DAILY (route: inhalation)        Allergies  Allergen Reactions   Clindamycin/Lincomycin Hives   Prednisone Other (See Comments)    Psychosis   Amoxicillin Rash   Avelox [Moxifloxacin Hcl In Nacl] Rash   Codeine Sulfate Nausea Only   Penicillins Rash   Sulfa Antibiotics Rash    Consultations: Palliative care Pulmonology   Procedures/Studies: CT Angio Chest Pulmonary Embolism (PE) W or WO Contrast  Result Date: 12/03/2022 CLINICAL DATA:  PE suspected EXAM: CT ANGIOGRAPHY CHEST WITH CONTRAST TECHNIQUE: Multidetector CT imaging of the chest was performed using the standard protocol during bolus administration of intravenous  contrast. Multiplanar CT image reconstructions and MIPs were obtained to evaluate the vascular anatomy. RADIATION DOSE REDUCTION: This exam was performed according to the departmental dose-optimization program which includes automated exposure control, adjustment of the mA and/or kV according to patient size and/or use of iterative reconstruction technique. CONTRAST:  75mL OMNIPAQUE IOHEXOL 350 MG/ML SOLN COMPARISON:  11/30/2022 FINDINGS: Cardiovascular: Satisfactory opacification of the pulmonary arteries to the segmental level. No evidence of pulmonary embolism. Cardiomegaly. Three-vessel coronary artery calcifications and stents. Trace pericardial effusion. Severe aortic atherosclerosis. Mediastinum/Nodes: Numerous unchanged enlarged mediastinal and hilar lymph nodes. Thyroid gland, trachea, and esophagus demonstrate no significant findings. Lungs/Pleura: Severe emphysema. Diffuse bilateral bronchial wall thickening. Small bilateral pleural effusions. Interlobular septal thickening throughout and heterogeneous airspace consolidation involving the posterior and inferior right upper lobe (series 7, image 64) Upper Abdomen: No acute abnormality. Musculoskeletal: No chest wall abnormality. No acute osseous findings. Review of the MIP images confirms the above findings. IMPRESSION: 1. Negative examination for pulmonary embolism. 2. Heterogeneous airspace consolidation involving the posterior and inferior right upper lobe, consistent with infection. 3. Interlobular septal thickening throughout the lungs and small bilateral pleural effusions. 4. Severe emphysema and diffuse bilateral bronchial wall thickening. 5. Numerous unchanged enlarged mediastinal and hilar lymph nodes, likely reactive 6. Cardiomegaly and coronary artery disease. Aortic Atherosclerosis (ICD10-I70.0) and Emphysema (ICD10-J43.9). Electronically Signed   By: Jearld Lesch M.D.   On: 12/03/2022 14:39   DG Chest Port 1 View  Result Date:  12/02/2022 CLINICAL DATA:  Hypoxia. Worsening shortness of breath today. Admitted for septic shock 2 days ago. EXAM: PORTABLE CHEST 1 VIEW COMPARISON:  Radiographs 11/30/2022, 04/18/2022 and 03/04/2022. Chest CT 11/30/2022. FINDINGS: 1748 hours. The heart size and mediastinal contours are stable with aortic atherosclerosis. There has been partial clearing of the dense airspace disease peripherally in the right upper lobe seen on the most recent radiographs. Coarse interstitial opacities throughout the lungs are grossly unchanged. There may be small bilateral pleural effusions. No pneumothorax. The bones appear unchanged. IMPRESSION: Interval partial clearing of right upper lobe pneumonia. Possible new small bilateral pleural effusions. Otherwise stable appearance of the chest with coarse interstitial opacities and underlying emphysema. Electronically Signed   By: Carey Bullocks M.D.   On: 12/02/2022 19:07   CT Chest Wo Contrast  Result Date: 11/30/2022 CLINICAL DATA:  Shortness of breath EXAM: CT CHEST WITHOUT CONTRAST TECHNIQUE: Multidetector CT imaging of the chest was performed following the standard protocol without IV contrast. RADIATION DOSE REDUCTION: This exam was performed according to the departmental dose-optimization program which includes automated exposure control, adjustment of the mA and/or kV according to patient size and/or use of iterative reconstruction technique. COMPARISON:  CT 01/14/2022, chest x-ray 11/30/2022 FINDINGS: Cardiovascular: Limited evaluation without intravenous contrast. Advanced aortic atherosclerosis. No aneurysm. Coronary vascular calcifications. Cardiomegaly. Small pericardial effusion Mediastinum/Nodes: Midline trachea. No suspicious thyroid mass. Multiple mildly enlarged mediastinal lymph nodes. Right upper paratracheal node measures 11 mm. Precarinal lymph node measures 1.4 cm. Subcarinal lymph node measures 1.7 cm. Esophagus within normal limits. Lungs/Pleura:  Advanced emphysema. Small right-sided pleural effusion. Consolidation within the right upper lobe and heterogeneous ground-glass density and partial consolidation at the right middle lobe with smaller foci of consolidation in the peripheral left lung base. No pneumothorax Upper Abdomen: No acute finding. Possible small kidney stones versus intrarenal vascular calcification Musculoskeletal: No acute or suspicious osseous abnormality. IMPRESSION: 1. Advanced emphysema. 2. Consolidation within the right upper lobe and heterogeneous ground-glass density and partial consolidation at the right middle lobe with smaller foci of consolidation in the peripheral left lung base. Findings are consistent with multifocal pneumonia. Imaging follow-up to resolution is recommended. 3. Small right-sided pleural effusion. 4. Cardiomegaly with small pericardial effusion. Aortic Atherosclerosis (ICD10-I70.0) and Emphysema (ICD10-J43.9). Electronically Signed   By: Jasmine Pang M.D.   On: 11/30/2022 16:52   DG Chest Portable 1 View  Result Date: 11/30/2022 CLINICAL DATA:  Shortness of breath. Recent vaccination. Baseline 3 liters/minute oxygen at home. Hypoxia. EXAM: PORTABLE CHEST 1 VIEW COMPARISON:  03/04/2022 FINDINGS: Lateral consolidation in the right upper lobe favoring pneumonia, less likely pulmonary hemorrhage. Right pleural thickening and blunting of the right costophrenic angle compatible with moderate right pleural effusion. Centrilobular emphysema. Mild cardiomegaly. Atherosclerotic calcification of the aortic arch. IMPRESSION: 1. Lateral consolidation in the right upper lobe favoring pneumonia, less likely pulmonary hemorrhage. 2. Moderate right pleural effusion. 3. Centrilobular emphysema. 4. Mild cardiomegaly. Electronically Signed   By: Gaylyn Rong M.D.   On: 11/30/2022 13:31      Subjective: Seen and examined on the day of discharge.  Medically stable.  Tearful, endorses feeling fatigued and constantly  tired.  Wishes to go home and be with her family.  Discharge Exam: Vitals:   12/09/22 0722 12/09/22 0733  BP:  (!) 168/42  Pulse:  66  Resp:  19  Temp:  98 F (36.7 C)  SpO2: 92% 100%   Vitals:   12/08/22 2158 12/09/22 0420 12/09/22 0722 12/09/22 0733  BP:  (!) 193/54  (!) 168/42  Pulse: 75 68  66  Resp: 18 18  19   Temp:  97.9 F (36.6 C)  98 F (36.7 C)  TempSrc:  Oral  Oral  SpO2: 98% 100% 92% 100%  Weight:      Height:        General: Pt is alert, awake, not in acute distress Cardiovascular: RRR, S1/S2 +, no rubs, no gallops Respiratory: CTA bilaterally, no wheezing, no rhonchi Abdominal: Soft, NT, ND, bowel sounds + Extremities: no edema, no cyanosis    The results of significant diagnostics from this hospitalization (including imaging, microbiology, ancillary and laboratory) are listed below for reference.     Microbiology: Recent Results (from the past 240 hour(s))  SARS Coronavirus 2 by RT PCR (hospital order, performed in [facility] hospital lab) *cepheid single result test* Anterior Nasal Swab     Status: None   Collection Time: 11/30/22 12:05 PM   Specimen: Anterior Nasal Swab  Result Value Ref Range Status   SARS Coronavirus 2 by RT PCR NEGATIVE NEGATIVE Final    Comment: (NOTE) SARS-CoV-2 target nucleic acids are NOT DETECTED.  The SARS-CoV-2 RNA is generally detectable in  upper and lower respiratory specimens during the acute phase of infection. The lowest concentration of SARS-CoV-2 viral copies this assay can detect is 250 copies / mL. A negative result does not preclude SARS-CoV-2 infection and should not be used as the sole basis for treatment or other patient management decisions.  A negative result may occur with improper specimen collection / handling, submission of specimen other than nasopharyngeal swab, presence of viral mutation(s) within the areas targeted by this assay, and inadequate number of viral copies (<250 copies / mL). A  negative result must be combined with clinical observations, patient history, and epidemiological information.  Fact Sheet for Patients:   RoadLapTop.co.za  Fact Sheet for Healthcare Providers: http://kim-miller.com/  This test is not yet approved or  cleared by the [place] FDA and has been authorized for detection and/or diagnosis of SARS-CoV-2 by FDA under an Emergency Use Authorization (EUA).  This EUA will remain in effect (meaning this test can be used) for the duration of the COVID-19 declaration under Section 564(b)(1) of the Act, 21 U.S.C. section 360bbb-3(b)(1), unless the authorization is terminated or revoked sooner.  Performed at [facility], [address] [place], [place] [address]   Blood culture (routine x 2)     Status: None   Collection Time: 11/30/22 12:58 PM   Specimen: BLOOD  Result Value Ref Range Status   Specimen Description BLOOD BLOOD RIGHT HAND  Final   Special Requests   Final    BOTTLES DRAWN AEROBIC AND ANAEROBIC Blood Culture results may not be optimal due to an excessive volume of blood received in culture bottles   Culture   Final    NO GROWTH 5 DAYS Performed at [facility], [address] [place], [place] [address]    Report Status 12/05/2022 FINAL  Final  Blood culture (routine x 2)     Status: None   Collection Time: 11/30/22  1:06 PM   Specimen: BLOOD  Result Value Ref Range Status   Specimen Description BLOOD BLOOD RIGHT HAND  Final   Special Requests   Final    BOTTLES DRAWN AEROBIC AND ANAEROBIC Blood Culture adequate volume   Culture   Final    NO GROWTH 5 DAYS Performed at [facility], [address] [place], [place] [address]    Report Status 12/05/2022 FINAL  Final  MRSA Next Gen by PCR, Nasal     Status: None   Collection Time: 12/03/22  4:15 AM   Specimen: Nasal Mucosa; Nasal Swab  Result Value Ref Range Status   MRSA by PCR Next Gen NOT  DETECTED NOT DETECTED Final    Comment: (NOTE) The GeneXpert MRSA Assay (FDA approved for NASAL specimens only), is one component of a comprehensive MRSA colonization surveillance program. It is not intended to diagnose MRSA infection nor to guide or monitor treatment for MRSA infections. Test performance is not FDA approved in patients less than 50 years old. Performed at [facility], [address] [place], [place] [address]      Labs: BNP (last 3 results) Recent Labs    04/18/22 1504 11/30/22 1205 12/04/22 0346  BNP 746.3* 473.9* 591.6*   Basic Metabolic Panel: Recent Labs  Lab 12/03/22 0426 12/04/22 0346 12/05/22 0258 12/07/22 0502  NA 134* 132* 130* 136  K 3.6 3.5 3.5 4.3  CL 97* 94* 91* 97*  CO2 29 30 30  32  GLUCOSE 88 127* 128* 123*  BUN 19 18 19 20   CREATININE  0.72 0.76 0.78 0.78  CALCIUM 8.5* 8.1* 8.1* 8.3*   Liver Function Tests: Recent Labs  Lab 12/03/22 0426 12/05/22 0258  AST 42* 49*  ALT 46* 57*  ALKPHOS 67 58  BILITOT 0.4 0.6  PROT 6.3* 6.0*  ALBUMIN 2.7* 2.5*   No results for input(s): "LIPASE", "AMYLASE" in the last 168 hours. No results for input(s): "AMMONIA" in the last 168 hours. CBC: Recent Labs  Lab 12/03/22 0426 12/04/22 0346 12/04/22 0651 12/05/22 0258 12/07/22 0502  WBC 11.7* 6.8  --  8.2 8.7  NEUTROABS 9.0*  --   --   --  6.7  HGB 8.1* 6.9* 7.0* 9.6* 9.5*  HCT 24.8* 20.2*  --  27.7* 28.7*  MCV 95.4 92.2  --  91.1 94.4  PLT 214 201  --  217 279   Cardiac Enzymes: No results for input(s): "CKTOTAL", "CKMB", "CKMBINDEX", "TROPONINI" in the last 168 hours. BNP: Invalid input(s): "POCBNP" CBG: No results for input(s): "GLUCAP" in the last 168 hours. D-Dimer No results for input(s): "DDIMER" in the last 72 hours. Hgb A1c No results for input(s): "HGBA1C" in the last 72 hours. Lipid Profile No results for input(s): "CHOL", "HDL", "LDLCALC", "TRIG", "CHOLHDL", "LDLDIRECT" in the last 72 hours. Thyroid  function studies No results for input(s): "TSH", "T4TOTAL", "T3FREE", "THYROIDAB" in the last 72 hours.  Invalid input(s): "FREET3" Anemia work up No results for input(s): "VITAMINB12", "FOLATE", "FERRITIN", "TIBC", "IRON", "RETICCTPCT" in the last 72 hours. Urinalysis    Component Value Date/Time   COLORURINE YELLOW (A) 04/18/2022 1537   APPEARANCEUR CLEAR (A) 04/18/2022 1537   APPEARANCEUR Clear 03/15/2022 1525   LABSPEC 1.017 04/18/2022 1537   PHURINE 6.0 04/18/2022 1537   GLUCOSEU NEGATIVE 04/18/2022 1537   HGBUR NEGATIVE 04/18/2022 1537   BILIRUBINUR NEGATIVE 04/18/2022 1537   BILIRUBINUR Negative 03/15/2022 1525   KETONESUR NEGATIVE 04/18/2022 1537   PROTEINUR 30 (A) 04/18/2022 1537   NITRITE NEGATIVE 04/18/2022 1537   LEUKOCYTESUR NEGATIVE 04/18/2022 1537   Sepsis Labs Recent Labs  Lab 12/03/22 0426 12/04/22 0346 12/05/22 0258 12/07/22 0502  WBC 11.7* 6.8 8.2 8.7   Microbiology Recent Results (from the past 240 hour(s))  SARS Coronavirus 2 by RT PCR (hospital order, performed in [facility] hospital lab) *cepheid single result test* Anterior Nasal Swab     Status: None   Collection Time: 11/30/22 12:05 PM   Specimen: Anterior Nasal Swab  Result Value Ref Range Status   SARS Coronavirus 2 by RT PCR NEGATIVE NEGATIVE Final    Comment: (NOTE) SARS-CoV-2 target nucleic acids are NOT DETECTED.  The SARS-CoV-2 RNA is generally detectable in upper and lower respiratory specimens during the acute phase of infection. The lowest concentration of SARS-CoV-2 viral copies this assay can detect is 250 copies / mL. A negative result does not preclude SARS-CoV-2 infection and should not be used as the sole basis for treatment or other patient management decisions.  A negative result may occur with improper specimen collection / handling, submission of specimen other than nasopharyngeal swab, presence of viral mutation(s) within the areas targeted by this assay, and inadequate  number of viral copies (<250 copies / mL). A negative result must be combined with clinical observations, patient history, and epidemiological information.  Fact Sheet for Patients:   RoadLapTop.co.za  Fact Sheet for Healthcare Providers: http://kim-miller.com/  This test is not yet approved or  cleared by the [place] FDA and has been authorized for detection and/or diagnosis of SARS-CoV-2 by FDA under an  Emergency Use Authorization (EUA).  This EUA will remain in effect (meaning this test can be used) for the duration of the COVID-19 declaration under Section 564(b)(1) of the Act, 21 U.S.C. section 360bbb-3(b)(1), unless the authorization is terminated or revoked sooner.  Performed at [facility], [address] [place], [place] [address]   Blood culture (routine x 2)     Status: None   Collection Time: 11/30/22 12:58 PM   Specimen: BLOOD  Result Value Ref Range Status   Specimen Description BLOOD BLOOD RIGHT HAND  Final   Special Requests   Final    BOTTLES DRAWN AEROBIC AND ANAEROBIC Blood Culture results may not be optimal due to an excessive volume of blood received in culture bottles   Culture   Final    NO GROWTH 5 DAYS Performed at [facility], [address], [place], [place] [address]    Report Status 12/05/2022 FINAL  Final  Blood culture (routine x 2)     Status: None   Collection Time: 11/30/22  1:06 PM   Specimen: BLOOD  Result Value Ref Range Status   Specimen Description BLOOD BLOOD RIGHT HAND  Final   Special Requests   Final    BOTTLES DRAWN AEROBIC AND ANAEROBIC Blood Culture adequate volume   Culture   Final    NO GROWTH 5 DAYS Performed at [facility], [address] [place], [place] [address]    Report Status 12/05/2022 FINAL  Final  MRSA Next Gen by PCR, Nasal     Status: None   Collection Time: 12/03/22  4:15 AM   Specimen: Nasal Mucosa; Nasal Swab  Result Value  Ref Range Status   MRSA by PCR Next Gen NOT DETECTED NOT DETECTED Final    Comment: (NOTE) The GeneXpert MRSA Assay (FDA approved for NASAL specimens only), is one component of a comprehensive MRSA colonization surveillance program. It is not intended to diagnose MRSA infection nor to guide or monitor treatment for MRSA infections. Test performance is not FDA approved in patients less than 64 years old. Performed at [facility], [address], [place], [place] [address]      Time coordinating discharge: Over 30 minutes  SIGNED:   Tresa Moore, MD  [facility] 12/09/2022, 11:50 AM Pager   If 7PM-7AM, please contact night-coverage

## 2022-12-09 NOTE — Consult Note (Signed)
[facility] Inpatient Consult   12/09/2022  Kristi Clark [birth year] [id number]  Primary Care Provider:   Laural Benes [facility])  Patient is currently active with Care Management for chronic disease management services.  Patient has been engaged by a  Gaffer.  Our community based plan of care has focused on disease management and community resource support.    Plan: Pt will discharge with hospice services at home. Will update the involved RNCM on pt's disposition.  Of note, Care Management services does not replace or interfere with any services that are needed or arranged by inpatient [facility] care management team.   For additional questions or referrals please contact:    Elliot Cousin, RN, [facility] Liaison [place]   Population Health Office Hours MTWF  8:00 am-6:00 pm [phone] mobile [phone] [Office toll free line] Office Hours are M-F 8:30 - 5 pm [email or web address]

## 2022-12-09 NOTE — TOC Transition Note (Signed)
Transition of Care [facility]) - CM/SW Discharge Note   Patient Details  Name: Kristi Clark MRN: [id number] Date of Birth: 21-Aug-1950  Transition of Care [facility]) CM/SW Contact:  Chapman Fitch, RN Phone Number: 12/09/2022, 1:14 PM   Clinical Narrative:    Notified by MD that plan is for home with hospice services today.   Ree Kida with [employer], contracting aware Family to bring portable o2 and will transport at discharge MD to sign DNR         Patient Goals and CMS Choice      Discharge Placement                         Discharge Plan and Services Additional resources added to the After Visit Summary for                                       Social Determinants of Health (SDOH) Interventions SDOH Screenings   Food Insecurity: No Food Insecurity (11/30/2022)  Housing: Low Risk  (11/30/2022)  Transportation Needs: No Transportation Needs (11/30/2022)  Utilities: Not At Risk (11/30/2022)  Alcohol Screen: Low Risk  (06/13/2022)  Depression (PHQ2-9): Low Risk  (11/25/2022)  Financial Resource Strain: Low Risk  (06/13/2022)  Physical Activity: Insufficiently Active (06/13/2022)  Social Connections: Socially Isolated (06/13/2022)  Stress: No Stress Concern Present (06/13/2022)  Tobacco Use: Medium Risk (11/30/2022)     Readmission Risk Interventions    12/01/2022   11:35 AM 03/06/2022   10:27 AM  Readmission Risk Prevention Plan  Transportation Screening Complete Complete  PCP or Specialist Appt within 3-5 Days  Complete  HRI or Home Care Consult Complete Complete  Social Work Consult for Recovery Care Planning/Counseling Complete Complete  Palliative Care Screening Not Applicable Not Applicable  Medication Review Oceanographer) Complete Complete

## 2022-12-09 NOTE — Progress Notes (Addendum)
Daily Progress Note   Patient Name: Kristi Clark       Date: 12/09/2022 DOB: 03/29/1950  Age: 72 y.o. MRN#: [id number] Attending Physician: Tresa Moore, MD Primary Care Physician: Dorcas Carrow, DO Admit Date: 11/30/2022  Reason for Consultation/Follow-up: Establishing goals of care  Subjective: Notes and labs reviewed.  Into see patient.  Attending is present at bedside.  Patient is sitting in bed with daughter at bedside.  Patient is deciding if she would like to move forward with hospice.  We discussed continuing an aggressive path versus a comfort path.  Discussed hospice care.  Answered questions as able.  Discussed quality of life and patient's goals.  Plans in place for discharge home with hospice.  Length of Stay: 9  Current Medications: Scheduled Meds:   acetylcysteine  3 mL Nebulization Daily   amiodarone  200 mg Oral Daily   amLODipine  5 mg Oral Daily   arformoterol  15 mcg Nebulization BID   aspirin EC  81 mg Oral Daily   atorvastatin  40 mg Oral Q supper   azithromycin  250 mg Oral Daily   clopidogrel  75 mg Oral Daily   enoxaparin (LOVENOX) injection  40 mg Subcutaneous Q24H   feeding supplement  237 mL Oral BID BM   ferrous sulfate  325 mg Oral Q breakfast   guaiFENesin  600 mg Oral BID   hydrALAZINE  25 mg Oral BID   ipratropium-albuterol  3 mL Nebulization TID   isosorbide mononitrate  60 mg Oral Daily   losartan  25 mg Oral Daily   metoprolol succinate  50 mg Oral Daily   mirtazapine  45 mg Oral QHS   pantoprazole  40 mg Oral BID   phenazopyridine  100 mg Oral TID WC   predniSONE  20 mg Oral Q breakfast   QUEtiapine  100 mg Oral QHS   tamsulosin  0.4 mg Oral Daily    Continuous Infusions:   PRN Meds: acetaminophen, albuterol,  ALPRAZolam, hydrALAZINE, labetalol, morphine injection, oxyCODONE, polyethylene glycol powder, sodium chloride  Physical Exam Pulmonary:     Effort: Pulmonary effort is normal.  Neurological:     Mental Status: She is alert.             Vital Signs: BP (!) 168/42 (BP Location: Right  Arm)   Pulse 66   Temp 98 F (36.7 C) (Oral)   Resp 19   Ht 4\' 11"  (1.499 m)   Wt 51.7 kg   LMP  (LMP Unknown)   SpO2 100%   BMI 23.03 kg/m  SpO2: SpO2: 100 % O2 Device: O2 Device: Nasal Cannula O2 Flow Rate: O2 Flow Rate (L/min): 9 L/min  Intake/output summary:  Intake/Output Summary (Last 24 hours) at 12/09/2022 1242 Last data filed at 12/09/2022 0900 Gross per 24 hour  Intake 240 ml  Output 1800 ml  Net -1560 ml   LBM: Last BM Date : 12/07/22 Baseline Weight: Weight: 51.7 kg Most recent weight: Weight: 51.7 kg   Patient Active Problem List   Diagnosis Date Noted   Septic shock (HCC) 12/01/2022   Elevated LFTs 11/30/2022   CAP (community acquired pneumonia) 11/30/2022   AKI (acute kidney injury) (HCC) 11/30/2022   Pleural effusion on right 11/30/2022   Malnutrition of moderate degree 04/20/2022   Acute on chronic hypoxic respiratory failure (HCC) 04/18/2022   Atrial fibrillation with RVR (HCC) 04/18/2022   Sepsis due to pneumonia (HCC) 04/18/2022   Acute on chronic diastolic CHF (congestive heart failure) (HCC) 04/18/2022   Hypokalemia 04/18/2022   Paroxysmal A-fib (HCC) 03/05/2022   Fall at home, initial encounter 03/05/2022   History of CAD (coronary artery disease) 03/05/2022   Chronic diastolic CHF (congestive heart failure) (HCC) 03/05/2022   DNR (do not resuscitate) 03/05/2022   Acute on chronic respiratory failure with hypoxemia (HCC) 03/04/2022   Hypotension due to drugs 02/10/2022   Confusion 02/10/2022   Depression, recurrent (HCC) 02/02/2022   Normocytic anemia 01/13/2022   Atrial fibrillation with RVR (HCC) 01/13/2022   Bilateral pneumonia 11/23/2021   Chronic  respiratory failure with hypoxia (HCC) 09/21/2021   Valvular vegetation 05/06/2021   Malnutrition of moderate degree 04/29/2021   Unintentional weight loss 04/28/2021   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 (HCC) 03/15/2021   Aortic atherosclerosis (HCC) 05/27/2020   COPD with acute exacerbation (HCC) 02/17/2020   Postherpetic neuralgia 11/24/2019   Nicotine dependence, cigarettes, w unsp disorders 10/17/2017   IBS (irritable bowel syndrome) 04/04/2017   Goals of care, counseling/discussion 10/03/2016   CAD (coronary artery disease) 10/03/2016   Iron deficiency anemia 10/03/2016   Essential hypertension 03/06/2015   Allergic rhinitis 09/03/2014   GERD (gastroesophageal reflux disease) 09/03/2014   Hyperlipidemia 09/03/2014   COPD, severe (HCC) 08/08/2013   Pulmonary nodules 08/08/2013    Palliative Care Assessment & Plan   Recommendations/Plan: Patient to discharge home with hospice  Code Status:    Code Status Orders  (From admission, onward)           Start     Ordered   11/30/22 1504  Do not attempt resuscitation (DNR)- Limited -Do Not Intubate (DNI)  Continuous       Question Answer Comment  If pulseless and not breathing No CPR or chest compressions.   In Pre-Arrest Conditions (Patient Is Breathing and Has A Pulse) Do not intubate. Provide all appropriate non-invasive medical interventions. Avoid ICU transfer unless indicated or required.   Consent: Discussion documented in EHR or advanced directives reviewed      11/30/22 1505           Code Status History     Date Active Date Inactive Code Status Order ID Comments User Context   04/18/2022 2022 04/21/2022 2238 DNR [id number]  Lucile Shutters, MD ED   03/04/2022 [phone] 03/08/2022 2123  DNR [id number]  Lurene Shadow, MD ED   03/04/2022 0636 03/04/2022 0922 DNR [id number]  Loleta Rose, MD ED   01/13/2022 0941 01/21/2022 1837 DNR [id number]  Bobette Mo, MD ED    01/13/2022 0722 01/13/2022 0941 Full Code [id number]  Bobette Mo, MD ED   09/17/2021 1301 09/17/2021 2034 Full Code [id number]  Laurier Nancy, MD Inpatient   09/11/2021 2358 09/13/2021 2322 DNR [id number]  Andris Baumann, MD ED   04/28/2021 2119 04/29/2021 2202 DNR [id number]  Cox, Amy N, DO Inpatient   04/28/2021 1958 04/28/2021 2119 Full Code [id number]  Cox, Amy Dorris Carnes, DO ED   02/17/2020 1326 02/20/2020 1819 DNR [id number]  Lucile Shutters, MD ED   02/17/2020 1209 02/17/2020 1326 Full Code [id number]  Lucile Shutters, MD ED   07/14/2014 1801 07/18/2014 1642 Full Code [id number]  Shaune Pollack, MD Inpatient      Advance Directive Documentation    Flowsheet Row Most Recent Value  Type of Advance Directive Healthcare Power of Attorney, Living will  Pre-existing out of facility DNR order (yellow form or pink MOST form) --  "MOST" Form in Place? --       Prognosis:  < 6 months    10:30-11:05  Care plan was discussed with attending at bedside  Thank you for allowing the Palliative Medicine Team to assist in the care of this patient.   Morton Stall, NP  Please contact Palliative Medicine Team phone at [phone] for questions and concerns.

## 2022-12-09 NOTE — Progress Notes (Signed)
Occupational Therapy Treatment Patient Details Name: Kristi Clark MRN: [id number] DOB: 08-12-1950 Today's Date: 12/09/2022   History of present illness 72 y.o. female with PMHx of end-stage COPD, chronic hypoxic respiratory failure on 3 L, atrial fibrillation not on AC, CAD, HFpEF, hypertension, hyperlipidemia, who presents to the ED due to shortness of breath, found a pleural effusion. Covid and flu vaccine received Friday 9/20. Dx of CAP (community acquired pneumonia).   OT comments  Kristi Clark was seen for OT treatment on this date. Upon arrival to room pt reclined in bed, agreeable to tx. Pt requires SBA + RW for toilet t/f, pericare, and standing grooming tasks. Pt limited by shortness of breath, O2 desat 80% on 4L [place], resolved to 89% with seated rest. Pt making progress toward goals, will continue to follow POC. Discharge recommendation remains appropriate.      If plan is discharge home, recommend the following:  A little help with walking and/or transfers;A little help with bathing/dressing/bathroom;Help with stairs or ramp for entrance;Assist for transportation;Assistance with cooking/housework   Equipment Recommendations  Tub/shower seat    Recommendations for Other Services      Precautions / Restrictions Precautions Precautions: Fall Restrictions Weight Bearing Restrictions: No       Mobility Bed Mobility Overal bed mobility: Modified Independent                  Transfers Overall transfer level: Needs assistance Equipment used: None Transfers: Sit to/from Stand Sit to Stand: Supervision                 Balance Overall balance assessment: Needs assistance Sitting-balance support: Feet supported Sitting balance-Leahy Scale: Normal     Standing balance support: During functional activity, No upper extremity supported Standing balance-Leahy Scale: Good                             ADL either performed or assessed with clinical  judgement   ADL Overall ADL's : Needs assistance/impaired                                       General ADL Comments: SBA + RW for toilet t/f, pericare, and standing grooming tasks. Pt limited by shortness of breath, O2 desat 80% on 4L [place], resolved to 89% with seated rest.      Cognition Arousal: Alert Behavior During Therapy: WFL for tasks assessed/performed Overall Cognitive Status: Within Functional Limits for tasks assessed                                          Pertinent Vitals/ Pain       Pain Assessment Pain Assessment: No/denies pain   Frequency  Min 1X/week        Progress Toward Goals  OT Goals(current goals can now be found in the care plan section)  Progress towards OT goals: Progressing toward goals  Acute Rehab OT Goals Patient Stated Goal: go home OT Goal Formulation: With patient Time For Goal Achievement: 12/15/22 Potential to Achieve Goals: Good ADL Goals Pt Will Perform Lower Body Bathing: sit to/from stand;bed level;with supervision Pt Will Transfer to Toilet: with supervision;regular height toilet Additional ADL Goal #1: Pt will demonstrate use of one learned energy conservation strategy during completion  of ADL tasks to promote improved activity tolerance.  Plan      Co-evaluation                 AM-PAC OT "6 Clicks" Daily Activity     Outcome Measure   Help from another person eating meals?: None Help from another person taking care of personal grooming?: A Little Help from another person toileting, which includes using toliet, bedpan, or urinal?: A Little Help from another person bathing (including washing, rinsing, drying)?: A Little Help from another person to put on and taking off regular upper body clothing?: A Little Help from another person to put on and taking off regular lower body clothing?: A Little 6 Click Score: 19    End of Session Equipment Utilized During Treatment: Rolling  walker (2 wheels);Oxygen  OT Visit Diagnosis: Other abnormalities of gait and mobility (R26.89);Muscle weakness (generalized) (M62.81);History of falling (Z91.81)   Activity Tolerance Patient tolerated treatment well   Patient Left in chair;with call bell/phone within reach   Nurse Communication          Time: 4401-0272 OT Time Calculation (min): 19 min  Charges: OT General Charges $OT Visit: 1 Visit OT Treatments $Self Care/Home Management : 8-22 mins  Kathie Dike, M.S. OTR/L  12/09/22, 12:10 PM  ascom [phone]

## 2022-12-09 NOTE — Plan of Care (Signed)
The patient has been discharged. IV has been removed. Discharge education has been completed with the patient and daughter at the bedside. A wheelchair has been order take the patient to her vehicle. The family has brought a portable oxygen tank for the patient to discharge on 4 liters of oxygen her baseline.  Problem: Education: Goal: Knowledge of General Education information will improve Description: Including pain rating scale, medication(s)/side effects and non-pharmacologic comfort measures Outcome: Completed/Met   Problem: Health Behavior/Discharge Planning: Goal: Ability to manage health-related needs will improve Outcome: Completed/Met   Problem: Clinical Measurements: Goal: Ability to maintain clinical measurements within normal limits will improve Outcome: Completed/Met Goal: Will remain free from infection Outcome: Completed/Met Goal: Diagnostic test results will improve Outcome: Completed/Met Goal: Respiratory complications will improve Outcome: Completed/Met Goal: Cardiovascular complication will be avoided Outcome: Completed/Met   Problem: Activity: Goal: Risk for activity intolerance will decrease Outcome: Completed/Met   Problem: Nutrition: Goal: Adequate nutrition will be maintained Outcome: Completed/Met   Problem: Coping: Goal: Level of anxiety will decrease Outcome: Completed/Met   Problem: Elimination: Goal: Will not experience complications related to bowel motility Outcome: Completed/Met Goal: Will not experience complications related to urinary retention Outcome: Completed/Met   Problem: Pain Managment: Goal: General experience of comfort will improve Outcome: Completed/Met   Problem: Safety: Goal: Ability to remain free from injury will improve Outcome: Completed/Met   Problem: Skin Integrity: Goal: Risk for impaired skin integrity will decrease Outcome: Completed/Met   Problem: Acute Rehab OT Goals (only OT should resolve) Goal: Pt. Will  Perform Lower Body Bathing Outcome: Completed/Met Goal: Pt. Will Transfer To Toilet Outcome: Completed/Met Goal: OT Additional ADL Goal #1 Outcome: Completed/Met   Problem: Acute Rehab PT Goals(only PT should resolve) Goal: Pt Will Go Supine/Side To Sit Outcome: Completed/Met Goal: Pt Will Ambulate Outcome: Completed/Met Goal: Pt/caregiver will Perform Home Exercise Program Outcome: Completed/Met

## 2022-12-09 NOTE — Progress Notes (Addendum)
[facility] Liaison Note  Received request from Bevelyn Ngo, RN , Transitions of Care Manager, for hospice services at home after discharge.  Spoke with patient and daugther to initiate education related to hospice philosophy, services, and team approach to care.  Patient/family verbalized understanding of information given.  Per discussion, the plan is for discharge home by daughter's private vehicle.  She will bring portable 02 for transport.    DME needs discussed.  Patient has the following equipment in the home: Home 02 provided by [employer].  Rollator walker, BSC, Wheelchair and shower chair.  Patient/family requests the following equipment in the home: No additional DME needed, according to daughter.      Please send signed and completed DNR home with the patient/family.  Please provide prescriptions at discharge as needed to ensure ongoing symptom management.    [facility] information and contact numbers given to daughter.  Above information shared with Bevelyn Ngo, RN, Transitions of Care Manager.      Please call with any Hospice related questions or concerns.   Thank you for the opportunity to participate in this patient's care.  Redge Gainer, [facility] Liaison [phone]

## 2022-12-09 NOTE — TOC Progression Note (Signed)
Transition of Care [facility]) - Progression Note    Patient Details  Name: Kristi Clark MRN: [id number] Date of Birth: 05/20/1950  [facility]) CM/SW Contact  Chapman Fitch, RN Phone Number: 12/09/2022, 9:02 AM  Clinical Narrative:      Palliative recommending inpatient hospice. Patient states she is not sure what she wants to do at this point, but would like additional information on hospice. Patient states she does not have a preference of hospice agency.  CMS Medicare.gov Compare Post Acute Care list reviewed with patient. Referral made to [facility] with [employer], [employer].  He has met wit patient to discuss option and patient requested additional time before proceeding,    Expected Discharge Plan: Home w Hospice Care    Expected Discharge Plan and Services                                               Social Determinants of Health (SDOH) Interventions SDOH Screenings   Food Insecurity: No Food Insecurity (11/30/2022)  Housing: Low Risk  (11/30/2022)  Transportation Needs: No Transportation Needs (11/30/2022)  Utilities: Not At Risk (11/30/2022)  Alcohol Screen: Low Risk  (06/13/2022)  Depression (PHQ2-9): Low Risk  (11/25/2022)  Financial Resource Strain: Low Risk  (06/13/2022)  Physical Activity: Insufficiently Active (06/13/2022)  Social Connections: Socially Isolated (06/13/2022)  Stress: No Stress Concern Present (06/13/2022)  Tobacco Use: Medium Risk (11/30/2022)    Readmission Risk Interventions    12/01/2022   11:35 AM 03/06/2022   10:27 AM  Readmission Risk Prevention Plan  Transportation Screening Complete Complete  PCP or Specialist Appt within 3-5 Days  Complete  HRI or Home Care Consult Complete Complete  Social Work Consult for Recovery Care Planning/Counseling Complete Complete  Palliative Care Screening Not Applicable Not Applicable  Medication Review Oceanographer) Complete Complete

## 2022-12-11 DIAGNOSIS — I2510 Atherosclerotic heart disease of native coronary artery without angina pectoris: Secondary | ICD-10-CM | POA: Diagnosis not present

## 2022-12-11 DIAGNOSIS — F419 Anxiety disorder, unspecified: Secondary | ICD-10-CM | POA: Diagnosis not present

## 2022-12-11 DIAGNOSIS — K219 Gastro-esophageal reflux disease without esophagitis: Secondary | ICD-10-CM | POA: Diagnosis not present

## 2022-12-11 DIAGNOSIS — J441 Chronic obstructive pulmonary disease with (acute) exacerbation: Secondary | ICD-10-CM | POA: Diagnosis not present

## 2022-12-11 DIAGNOSIS — D649 Anemia, unspecified: Secondary | ICD-10-CM | POA: Diagnosis not present

## 2022-12-11 DIAGNOSIS — I5030 Unspecified diastolic (congestive) heart failure: Secondary | ICD-10-CM | POA: Diagnosis not present

## 2022-12-11 DIAGNOSIS — E7849 Other hyperlipidemia: Secondary | ICD-10-CM | POA: Diagnosis not present

## 2022-12-11 DIAGNOSIS — I10 Essential (primary) hypertension: Secondary | ICD-10-CM | POA: Diagnosis not present

## 2022-12-11 DIAGNOSIS — J309 Allergic rhinitis, unspecified: Secondary | ICD-10-CM | POA: Diagnosis not present

## 2022-12-11 DIAGNOSIS — I4891 Unspecified atrial fibrillation: Secondary | ICD-10-CM | POA: Diagnosis not present

## 2022-12-11 DIAGNOSIS — F32A Depression, unspecified: Secondary | ICD-10-CM | POA: Diagnosis not present

## 2022-12-11 DIAGNOSIS — J9611 Chronic respiratory failure with hypoxia: Secondary | ICD-10-CM | POA: Diagnosis not present

## 2022-12-12 ENCOUNTER — Telehealth: Payer: Self-pay | Admitting: *Deleted

## 2022-12-12 DIAGNOSIS — I4891 Unspecified atrial fibrillation: Secondary | ICD-10-CM | POA: Diagnosis not present

## 2022-12-12 DIAGNOSIS — J9611 Chronic respiratory failure with hypoxia: Secondary | ICD-10-CM | POA: Diagnosis not present

## 2022-12-12 DIAGNOSIS — J441 Chronic obstructive pulmonary disease with (acute) exacerbation: Secondary | ICD-10-CM | POA: Diagnosis not present

## 2022-12-12 DIAGNOSIS — K219 Gastro-esophageal reflux disease without esophagitis: Secondary | ICD-10-CM | POA: Diagnosis not present

## 2022-12-12 DIAGNOSIS — I5030 Unspecified diastolic (congestive) heart failure: Secondary | ICD-10-CM | POA: Diagnosis not present

## 2022-12-12 DIAGNOSIS — I10 Essential (primary) hypertension: Secondary | ICD-10-CM | POA: Diagnosis not present

## 2022-12-12 NOTE — Transitions of Care (Post Inpatient/ED Visit) (Signed)
12/12/2022  Name: Kristi Clark MRN: [id number] DOB: [birth year]  Today's TOC FU Call Status: Today's TOC FU Call Status:: Unsuccessful Call (1st Attempt) Unsuccessful Call (1st Attempt) Date: 12/12/22  Attempted to reach the patient regarding the most recent Inpatient/ED visit.  Follow Up Plan: Additional outreach attempts will be made to reach the patient to complete the Transitions of Care (Post Inpatient/ED visit) call.   Gean Maidens BSN RN [facility] Care Management [phone]

## 2022-12-13 DIAGNOSIS — I10 Essential (primary) hypertension: Secondary | ICD-10-CM | POA: Diagnosis not present

## 2022-12-13 DIAGNOSIS — I5030 Unspecified diastolic (congestive) heart failure: Secondary | ICD-10-CM | POA: Diagnosis not present

## 2022-12-13 DIAGNOSIS — I4891 Unspecified atrial fibrillation: Secondary | ICD-10-CM | POA: Diagnosis not present

## 2022-12-13 DIAGNOSIS — J9611 Chronic respiratory failure with hypoxia: Secondary | ICD-10-CM | POA: Diagnosis not present

## 2022-12-13 DIAGNOSIS — K219 Gastro-esophageal reflux disease without esophagitis: Secondary | ICD-10-CM | POA: Diagnosis not present

## 2022-12-13 DIAGNOSIS — J441 Chronic obstructive pulmonary disease with (acute) exacerbation: Secondary | ICD-10-CM | POA: Diagnosis not present

## 2022-12-14 DIAGNOSIS — I5030 Unspecified diastolic (congestive) heart failure: Secondary | ICD-10-CM | POA: Diagnosis not present

## 2022-12-14 DIAGNOSIS — J9611 Chronic respiratory failure with hypoxia: Secondary | ICD-10-CM | POA: Diagnosis not present

## 2022-12-14 DIAGNOSIS — I4891 Unspecified atrial fibrillation: Secondary | ICD-10-CM | POA: Diagnosis not present

## 2022-12-14 DIAGNOSIS — I10 Essential (primary) hypertension: Secondary | ICD-10-CM | POA: Diagnosis not present

## 2022-12-14 DIAGNOSIS — J441 Chronic obstructive pulmonary disease with (acute) exacerbation: Secondary | ICD-10-CM | POA: Diagnosis not present

## 2022-12-14 DIAGNOSIS — K219 Gastro-esophageal reflux disease without esophagitis: Secondary | ICD-10-CM | POA: Diagnosis not present

## 2022-12-15 DIAGNOSIS — I4891 Unspecified atrial fibrillation: Secondary | ICD-10-CM | POA: Diagnosis not present

## 2022-12-15 DIAGNOSIS — J9611 Chronic respiratory failure with hypoxia: Secondary | ICD-10-CM | POA: Diagnosis not present

## 2022-12-15 DIAGNOSIS — J441 Chronic obstructive pulmonary disease with (acute) exacerbation: Secondary | ICD-10-CM | POA: Diagnosis not present

## 2022-12-15 DIAGNOSIS — I5030 Unspecified diastolic (congestive) heart failure: Secondary | ICD-10-CM | POA: Diagnosis not present

## 2022-12-15 DIAGNOSIS — K219 Gastro-esophageal reflux disease without esophagitis: Secondary | ICD-10-CM | POA: Diagnosis not present

## 2022-12-15 DIAGNOSIS — I10 Essential (primary) hypertension: Secondary | ICD-10-CM | POA: Diagnosis not present

## 2022-12-16 ENCOUNTER — Ambulatory Visit: Payer: Self-pay | Admitting: *Deleted

## 2022-12-16 DIAGNOSIS — I4891 Unspecified atrial fibrillation: Secondary | ICD-10-CM | POA: Diagnosis not present

## 2022-12-16 DIAGNOSIS — I5030 Unspecified diastolic (congestive) heart failure: Secondary | ICD-10-CM | POA: Diagnosis not present

## 2022-12-16 DIAGNOSIS — J441 Chronic obstructive pulmonary disease with (acute) exacerbation: Secondary | ICD-10-CM | POA: Diagnosis not present

## 2022-12-16 DIAGNOSIS — K219 Gastro-esophageal reflux disease without esophagitis: Secondary | ICD-10-CM | POA: Diagnosis not present

## 2022-12-16 DIAGNOSIS — I10 Essential (primary) hypertension: Secondary | ICD-10-CM | POA: Diagnosis not present

## 2022-12-16 DIAGNOSIS — J9611 Chronic respiratory failure with hypoxia: Secondary | ICD-10-CM | POA: Diagnosis not present

## 2022-12-16 NOTE — Patient Outreach (Signed)
Care Coordination   Follow Up Visit Note   12/16/2022 Name: ONDRIA BOMBA MRN: [id number] DOB: 04/13/1950  VELIA REDLINGER is a 72 y.o. year old female who sees Dorcas Carrow, DO for primary care. I spoke with [name], daughter of JULIANYS PASLEY by phone today.  What matters to the patients health and wellness today?  Patient recently admitted to hospital for CAP, discharged home with hospice.  Daughter confirms she has had the nurse, CNA, Chaplain to visit the home, SW will visit today.  Denies any urgent concerns, encouraged to contact this care manager with questions.      Goals Addressed             This Visit's Progress    COMPLETED: Effective management of heart conditions   On track    Care Coordination Interventions: Counseled on increased risk of stroke due to Afib and benefits of anticoagulation for stroke prevention Counseled on bleeding risk associated with Plavix and aspirin and importance of self-monitoring for signs/symptoms of bleeding Counseled on avoidance of NSAIDs due to increased bleeding risk with anticoagulants Provided education on low sodium diet Advised patient to weigh each morning after emptying bladder Discussed importance of daily weight and advised patient to weigh and record daily Evaluation of current treatment plan related to hypertension self management and patient's adherence to plan as established by provider Reviewed medications with patient and discussed importance of compliance Discussed plans with patient for ongoing care management follow up and provided patient with direct contact information for care management team          SDOH assessments and interventions completed:  No     Care Coordination Interventions:  Yes, provided   Follow up plan: No further intervention required.   Encounter Outcome:  Patient Visit Completed   Kemper Durie RN, MSN, CCM [place]  [facility], [facility] RN  Care Coordinator Direct Dial: [phone] / Main [phone] Fax [phone] Email: [email or web address] Website: [email or web address]

## 2022-12-17 DIAGNOSIS — I5030 Unspecified diastolic (congestive) heart failure: Secondary | ICD-10-CM | POA: Diagnosis not present

## 2022-12-17 DIAGNOSIS — K219 Gastro-esophageal reflux disease without esophagitis: Secondary | ICD-10-CM | POA: Diagnosis not present

## 2022-12-17 DIAGNOSIS — J9611 Chronic respiratory failure with hypoxia: Secondary | ICD-10-CM | POA: Diagnosis not present

## 2022-12-17 DIAGNOSIS — J441 Chronic obstructive pulmonary disease with (acute) exacerbation: Secondary | ICD-10-CM | POA: Diagnosis not present

## 2022-12-17 DIAGNOSIS — I4891 Unspecified atrial fibrillation: Secondary | ICD-10-CM | POA: Diagnosis not present

## 2022-12-17 DIAGNOSIS — I10 Essential (primary) hypertension: Secondary | ICD-10-CM | POA: Diagnosis not present

## 2022-12-18 DIAGNOSIS — I10 Essential (primary) hypertension: Secondary | ICD-10-CM | POA: Diagnosis not present

## 2022-12-18 DIAGNOSIS — I4891 Unspecified atrial fibrillation: Secondary | ICD-10-CM | POA: Diagnosis not present

## 2022-12-18 DIAGNOSIS — I5030 Unspecified diastolic (congestive) heart failure: Secondary | ICD-10-CM | POA: Diagnosis not present

## 2022-12-18 DIAGNOSIS — J441 Chronic obstructive pulmonary disease with (acute) exacerbation: Secondary | ICD-10-CM | POA: Diagnosis not present

## 2022-12-18 DIAGNOSIS — J9611 Chronic respiratory failure with hypoxia: Secondary | ICD-10-CM | POA: Diagnosis not present

## 2022-12-18 DIAGNOSIS — K219 Gastro-esophageal reflux disease without esophagitis: Secondary | ICD-10-CM | POA: Diagnosis not present

## 2022-12-19 DIAGNOSIS — J9611 Chronic respiratory failure with hypoxia: Secondary | ICD-10-CM | POA: Diagnosis not present

## 2022-12-19 DIAGNOSIS — K219 Gastro-esophageal reflux disease without esophagitis: Secondary | ICD-10-CM | POA: Diagnosis not present

## 2022-12-19 DIAGNOSIS — I5030 Unspecified diastolic (congestive) heart failure: Secondary | ICD-10-CM | POA: Diagnosis not present

## 2022-12-19 DIAGNOSIS — I10 Essential (primary) hypertension: Secondary | ICD-10-CM | POA: Diagnosis not present

## 2022-12-19 DIAGNOSIS — I4891 Unspecified atrial fibrillation: Secondary | ICD-10-CM | POA: Diagnosis not present

## 2022-12-19 DIAGNOSIS — J441 Chronic obstructive pulmonary disease with (acute) exacerbation: Secondary | ICD-10-CM | POA: Diagnosis not present

## 2022-12-20 DIAGNOSIS — I4891 Unspecified atrial fibrillation: Secondary | ICD-10-CM | POA: Diagnosis not present

## 2022-12-20 DIAGNOSIS — I5030 Unspecified diastolic (congestive) heart failure: Secondary | ICD-10-CM | POA: Diagnosis not present

## 2022-12-20 DIAGNOSIS — I10 Essential (primary) hypertension: Secondary | ICD-10-CM | POA: Diagnosis not present

## 2022-12-20 DIAGNOSIS — K219 Gastro-esophageal reflux disease without esophagitis: Secondary | ICD-10-CM | POA: Diagnosis not present

## 2022-12-20 DIAGNOSIS — J441 Chronic obstructive pulmonary disease with (acute) exacerbation: Secondary | ICD-10-CM | POA: Diagnosis not present

## 2022-12-20 DIAGNOSIS — J9611 Chronic respiratory failure with hypoxia: Secondary | ICD-10-CM | POA: Diagnosis not present

## 2022-12-21 DIAGNOSIS — I4891 Unspecified atrial fibrillation: Secondary | ICD-10-CM | POA: Diagnosis not present

## 2022-12-21 DIAGNOSIS — J441 Chronic obstructive pulmonary disease with (acute) exacerbation: Secondary | ICD-10-CM | POA: Diagnosis not present

## 2022-12-21 DIAGNOSIS — K219 Gastro-esophageal reflux disease without esophagitis: Secondary | ICD-10-CM | POA: Diagnosis not present

## 2022-12-21 DIAGNOSIS — I10 Essential (primary) hypertension: Secondary | ICD-10-CM | POA: Diagnosis not present

## 2022-12-21 DIAGNOSIS — J9611 Chronic respiratory failure with hypoxia: Secondary | ICD-10-CM | POA: Diagnosis not present

## 2022-12-21 DIAGNOSIS — I5030 Unspecified diastolic (congestive) heart failure: Secondary | ICD-10-CM | POA: Diagnosis not present

## 2022-12-22 DIAGNOSIS — I10 Essential (primary) hypertension: Secondary | ICD-10-CM | POA: Diagnosis not present

## 2022-12-22 DIAGNOSIS — I4891 Unspecified atrial fibrillation: Secondary | ICD-10-CM | POA: Diagnosis not present

## 2022-12-22 DIAGNOSIS — I5030 Unspecified diastolic (congestive) heart failure: Secondary | ICD-10-CM | POA: Diagnosis not present

## 2022-12-22 DIAGNOSIS — K219 Gastro-esophageal reflux disease without esophagitis: Secondary | ICD-10-CM | POA: Diagnosis not present

## 2022-12-22 DIAGNOSIS — J9611 Chronic respiratory failure with hypoxia: Secondary | ICD-10-CM | POA: Diagnosis not present

## 2022-12-22 DIAGNOSIS — J441 Chronic obstructive pulmonary disease with (acute) exacerbation: Secondary | ICD-10-CM | POA: Diagnosis not present

## 2022-12-23 DIAGNOSIS — K219 Gastro-esophageal reflux disease without esophagitis: Secondary | ICD-10-CM | POA: Diagnosis not present

## 2022-12-23 DIAGNOSIS — J441 Chronic obstructive pulmonary disease with (acute) exacerbation: Secondary | ICD-10-CM | POA: Diagnosis not present

## 2022-12-23 DIAGNOSIS — I4891 Unspecified atrial fibrillation: Secondary | ICD-10-CM | POA: Diagnosis not present

## 2022-12-23 DIAGNOSIS — I10 Essential (primary) hypertension: Secondary | ICD-10-CM | POA: Diagnosis not present

## 2022-12-23 DIAGNOSIS — J9611 Chronic respiratory failure with hypoxia: Secondary | ICD-10-CM | POA: Diagnosis not present

## 2022-12-23 DIAGNOSIS — I5030 Unspecified diastolic (congestive) heart failure: Secondary | ICD-10-CM | POA: Diagnosis not present

## 2022-12-24 DIAGNOSIS — K219 Gastro-esophageal reflux disease without esophagitis: Secondary | ICD-10-CM | POA: Diagnosis not present

## 2022-12-24 DIAGNOSIS — I4891 Unspecified atrial fibrillation: Secondary | ICD-10-CM | POA: Diagnosis not present

## 2022-12-24 DIAGNOSIS — J9611 Chronic respiratory failure with hypoxia: Secondary | ICD-10-CM | POA: Diagnosis not present

## 2022-12-24 DIAGNOSIS — I10 Essential (primary) hypertension: Secondary | ICD-10-CM | POA: Diagnosis not present

## 2022-12-24 DIAGNOSIS — J441 Chronic obstructive pulmonary disease with (acute) exacerbation: Secondary | ICD-10-CM | POA: Diagnosis not present

## 2022-12-24 DIAGNOSIS — I5030 Unspecified diastolic (congestive) heart failure: Secondary | ICD-10-CM | POA: Diagnosis not present

## 2022-12-25 DIAGNOSIS — I4891 Unspecified atrial fibrillation: Secondary | ICD-10-CM | POA: Diagnosis not present

## 2022-12-25 DIAGNOSIS — I5030 Unspecified diastolic (congestive) heart failure: Secondary | ICD-10-CM | POA: Diagnosis not present

## 2022-12-25 DIAGNOSIS — I10 Essential (primary) hypertension: Secondary | ICD-10-CM | POA: Diagnosis not present

## 2022-12-25 DIAGNOSIS — J441 Chronic obstructive pulmonary disease with (acute) exacerbation: Secondary | ICD-10-CM | POA: Diagnosis not present

## 2022-12-25 DIAGNOSIS — K219 Gastro-esophageal reflux disease without esophagitis: Secondary | ICD-10-CM | POA: Diagnosis not present

## 2022-12-25 DIAGNOSIS — J9611 Chronic respiratory failure with hypoxia: Secondary | ICD-10-CM | POA: Diagnosis not present

## 2022-12-26 DIAGNOSIS — J441 Chronic obstructive pulmonary disease with (acute) exacerbation: Secondary | ICD-10-CM | POA: Diagnosis not present

## 2022-12-26 DIAGNOSIS — J9611 Chronic respiratory failure with hypoxia: Secondary | ICD-10-CM | POA: Diagnosis not present

## 2022-12-26 DIAGNOSIS — I5030 Unspecified diastolic (congestive) heart failure: Secondary | ICD-10-CM | POA: Diagnosis not present

## 2022-12-26 DIAGNOSIS — I10 Essential (primary) hypertension: Secondary | ICD-10-CM | POA: Diagnosis not present

## 2022-12-26 DIAGNOSIS — I4891 Unspecified atrial fibrillation: Secondary | ICD-10-CM | POA: Diagnosis not present

## 2022-12-26 DIAGNOSIS — K219 Gastro-esophageal reflux disease without esophagitis: Secondary | ICD-10-CM | POA: Diagnosis not present

## 2022-12-27 DIAGNOSIS — J441 Chronic obstructive pulmonary disease with (acute) exacerbation: Secondary | ICD-10-CM | POA: Diagnosis not present

## 2022-12-27 DIAGNOSIS — I10 Essential (primary) hypertension: Secondary | ICD-10-CM | POA: Diagnosis not present

## 2022-12-27 DIAGNOSIS — J9611 Chronic respiratory failure with hypoxia: Secondary | ICD-10-CM | POA: Diagnosis not present

## 2022-12-27 DIAGNOSIS — I5030 Unspecified diastolic (congestive) heart failure: Secondary | ICD-10-CM | POA: Diagnosis not present

## 2022-12-27 DIAGNOSIS — K219 Gastro-esophageal reflux disease without esophagitis: Secondary | ICD-10-CM | POA: Diagnosis not present

## 2022-12-27 DIAGNOSIS — I4891 Unspecified atrial fibrillation: Secondary | ICD-10-CM | POA: Diagnosis not present

## 2022-12-28 ENCOUNTER — Other Ambulatory Visit: Payer: Self-pay

## 2022-12-28 ENCOUNTER — Inpatient Hospital Stay
Admission: EM | Admit: 2022-12-28 | Discharge: 2022-12-30 | DRG: 480 | Disposition: A | Discharge status: Hospice | Attending: Internal Medicine | Admitting: Internal Medicine

## 2022-12-28 ENCOUNTER — Emergency Department

## 2022-12-28 DIAGNOSIS — Z8619 Personal history of other infectious and parasitic diseases: Secondary | ICD-10-CM

## 2022-12-28 DIAGNOSIS — E785 Hyperlipidemia, unspecified: Secondary | ICD-10-CM | POA: Diagnosis present

## 2022-12-28 DIAGNOSIS — S72001A Fracture of unspecified part of neck of right femur, initial encounter for closed fracture: Secondary | ICD-10-CM | POA: Diagnosis not present

## 2022-12-28 DIAGNOSIS — Z8701 Personal history of pneumonia (recurrent): Secondary | ICD-10-CM

## 2022-12-28 DIAGNOSIS — Z882 Allergy status to sulfonamides status: Secondary | ICD-10-CM | POA: Diagnosis not present

## 2022-12-28 DIAGNOSIS — W19XXXA Unspecified fall, initial encounter: Secondary | ICD-10-CM | POA: Diagnosis not present

## 2022-12-28 DIAGNOSIS — S72141A Displaced intertrochanteric fracture of right femur, initial encounter for closed fracture: Secondary | ICD-10-CM | POA: Diagnosis not present

## 2022-12-28 DIAGNOSIS — Z9981 Dependence on supplemental oxygen: Secondary | ICD-10-CM | POA: Diagnosis not present

## 2022-12-28 DIAGNOSIS — I517 Cardiomegaly: Secondary | ICD-10-CM | POA: Diagnosis not present

## 2022-12-28 DIAGNOSIS — M8580 Other specified disorders of bone density and structure, unspecified site: Secondary | ICD-10-CM | POA: Diagnosis present

## 2022-12-28 DIAGNOSIS — Z7951 Long term (current) use of inhaled steroids: Secondary | ICD-10-CM

## 2022-12-28 DIAGNOSIS — I159 Secondary hypertension, unspecified: Secondary | ICD-10-CM | POA: Diagnosis not present

## 2022-12-28 DIAGNOSIS — I5032 Chronic diastolic (congestive) heart failure: Secondary | ICD-10-CM | POA: Diagnosis present

## 2022-12-28 DIAGNOSIS — Y92009 Unspecified place in unspecified non-institutional (private) residence as the place of occurrence of the external cause: Secondary | ICD-10-CM

## 2022-12-28 DIAGNOSIS — Z6822 Body mass index (BMI) 22.0-22.9, adult: Secondary | ICD-10-CM | POA: Diagnosis not present

## 2022-12-28 DIAGNOSIS — J441 Chronic obstructive pulmonary disease with (acute) exacerbation: Secondary | ICD-10-CM | POA: Diagnosis not present

## 2022-12-28 DIAGNOSIS — K219 Gastro-esophageal reflux disease without esophagitis: Secondary | ICD-10-CM | POA: Diagnosis not present

## 2022-12-28 DIAGNOSIS — Z66 Do not resuscitate: Secondary | ICD-10-CM | POA: Diagnosis present

## 2022-12-28 DIAGNOSIS — Z880 Allergy status to penicillin: Secondary | ICD-10-CM | POA: Diagnosis not present

## 2022-12-28 DIAGNOSIS — I10 Essential (primary) hypertension: Secondary | ICD-10-CM | POA: Diagnosis not present

## 2022-12-28 DIAGNOSIS — Z419 Encounter for procedure for purposes other than remedying health state, unspecified: Secondary | ICD-10-CM

## 2022-12-28 DIAGNOSIS — Z90710 Acquired absence of both cervix and uterus: Secondary | ICD-10-CM

## 2022-12-28 DIAGNOSIS — E440 Moderate protein-calorie malnutrition: Secondary | ICD-10-CM | POA: Diagnosis present

## 2022-12-28 DIAGNOSIS — F17210 Nicotine dependence, cigarettes, uncomplicated: Secondary | ICD-10-CM | POA: Diagnosis present

## 2022-12-28 DIAGNOSIS — J449 Chronic obstructive pulmonary disease, unspecified: Secondary | ICD-10-CM | POA: Diagnosis present

## 2022-12-28 DIAGNOSIS — F32A Depression, unspecified: Secondary | ICD-10-CM | POA: Diagnosis present

## 2022-12-28 DIAGNOSIS — I480 Paroxysmal atrial fibrillation: Secondary | ICD-10-CM | POA: Diagnosis not present

## 2022-12-28 DIAGNOSIS — Z555 Less than a high school diploma: Secondary | ICD-10-CM | POA: Diagnosis not present

## 2022-12-28 DIAGNOSIS — D649 Anemia, unspecified: Secondary | ICD-10-CM | POA: Diagnosis present

## 2022-12-28 DIAGNOSIS — Z8249 Family history of ischemic heart disease and other diseases of the circulatory system: Secondary | ICD-10-CM

## 2022-12-28 DIAGNOSIS — Z79899 Other long term (current) drug therapy: Secondary | ICD-10-CM

## 2022-12-28 DIAGNOSIS — Z955 Presence of coronary angioplasty implant and graft: Secondary | ICD-10-CM

## 2022-12-28 DIAGNOSIS — I4891 Unspecified atrial fibrillation: Secondary | ICD-10-CM | POA: Diagnosis present

## 2022-12-28 DIAGNOSIS — Z9049 Acquired absence of other specified parts of digestive tract: Secondary | ICD-10-CM

## 2022-12-28 DIAGNOSIS — Z83438 Family history of other disorder of lipoprotein metabolism and other lipidemia: Secondary | ICD-10-CM | POA: Diagnosis not present

## 2022-12-28 DIAGNOSIS — Z85828 Personal history of other malignant neoplasm of skin: Secondary | ICD-10-CM

## 2022-12-28 DIAGNOSIS — S72141D Displaced intertrochanteric fracture of right femur, subsequent encounter for closed fracture with routine healing: Secondary | ICD-10-CM | POA: Diagnosis not present

## 2022-12-28 DIAGNOSIS — M542 Cervicalgia: Secondary | ICD-10-CM | POA: Diagnosis not present

## 2022-12-28 DIAGNOSIS — I110 Hypertensive heart disease with heart failure: Secondary | ICD-10-CM | POA: Diagnosis present

## 2022-12-28 DIAGNOSIS — L570 Actinic keratosis: Secondary | ICD-10-CM | POA: Diagnosis present

## 2022-12-28 DIAGNOSIS — J9611 Chronic respiratory failure with hypoxia: Secondary | ICD-10-CM | POA: Diagnosis not present

## 2022-12-28 DIAGNOSIS — I2510 Atherosclerotic heart disease of native coronary artery without angina pectoris: Secondary | ICD-10-CM | POA: Diagnosis present

## 2022-12-28 DIAGNOSIS — J690 Pneumonitis due to inhalation of food and vomit: Secondary | ICD-10-CM | POA: Diagnosis present

## 2022-12-28 DIAGNOSIS — R519 Headache, unspecified: Secondary | ICD-10-CM | POA: Diagnosis not present

## 2022-12-28 DIAGNOSIS — I5030 Unspecified diastolic (congestive) heart failure: Secondary | ICD-10-CM | POA: Diagnosis not present

## 2022-12-28 DIAGNOSIS — W010XXA Fall on same level from slipping, tripping and stumbling without subsequent striking against object, initial encounter: Secondary | ICD-10-CM | POA: Diagnosis present

## 2022-12-28 DIAGNOSIS — Z881 Allergy status to other antibiotic agents status: Secondary | ICD-10-CM

## 2022-12-28 DIAGNOSIS — M25551 Pain in right hip: Secondary | ICD-10-CM | POA: Diagnosis present

## 2022-12-28 DIAGNOSIS — R918 Other nonspecific abnormal finding of lung field: Secondary | ICD-10-CM | POA: Diagnosis not present

## 2022-12-28 LAB — CBC WITH DIFFERENTIAL/PLATELET
Abs Immature Granulocytes: 0.02 10*3/uL (ref 0.00–0.07)
Basophils Absolute: 0 10*3/uL (ref 0.0–0.1)
Basophils Relative: 1 %
Eosinophils Absolute: 0.2 10*3/uL (ref 0.0–0.5)
Eosinophils Relative: 3 %
HCT: 35.7 % — ABNORMAL LOW (ref 36.0–46.0)
Hemoglobin: 11 g/dL — ABNORMAL LOW (ref 12.0–15.0)
Immature Granulocytes: 0 %
Lymphocytes Relative: 41 %
Lymphs Abs: 2.3 10*3/uL (ref 0.7–4.0)
MCH: 30.3 pg (ref 26.0–34.0)
MCHC: 30.8 g/dL (ref 30.0–36.0)
MCV: 98.3 fL (ref 80.0–100.0)
Monocytes Absolute: 0.4 10*3/uL (ref 0.1–1.0)
Monocytes Relative: 7 %
Neutro Abs: 2.7 10*3/uL (ref 1.7–7.7)
Neutrophils Relative %: 48 %
Platelets: 209 10*3/uL (ref 150–400)
RBC: 3.63 MIL/uL — ABNORMAL LOW (ref 3.87–5.11)
RDW: 13.9 % (ref 11.5–15.5)
WBC: 5.6 10*3/uL (ref 4.0–10.5)
nRBC: 0 % (ref 0.0–0.2)

## 2022-12-28 LAB — BASIC METABOLIC PANEL
Anion gap: 8 (ref 5–15)
BUN: 16 mg/dL (ref 8–23)
CO2: 33 mmol/L — ABNORMAL HIGH (ref 22–32)
Calcium: 9.1 mg/dL (ref 8.9–10.3)
Chloride: 97 mmol/L — ABNORMAL LOW (ref 98–111)
Creatinine, Ser: 0.86 mg/dL (ref 0.44–1.00)
GFR, Estimated: >60 mL/min (ref >60)
Glucose, Bld: 115 mg/dL — ABNORMAL HIGH (ref 70–99)
Potassium: 4 mmol/L (ref 3.5–5.1)
Sodium: 138 mmol/L (ref 135–145)

## 2022-12-28 LAB — TYPE AND SCREEN
ABO/RH(D): A NEG
Antibody Screen: NEGATIVE

## 2022-12-28 MED ORDER — FENTANYL CITRATE PF 50 MCG/ML IJ SOSY
50.0000 ug | PREFILLED_SYRINGE | Freq: Once | INTRAMUSCULAR | Status: AC
  Administered 2022-12-28: 50 ug via INTRAVENOUS
  Filled 2022-12-28: qty 1

## 2022-12-28 MED ORDER — ALBUTEROL SULFATE HFA 108 (90 BASE) MCG/ACT IN AERS: 1.0000 | INHALATION_SPRAY | Freq: Four times a day (QID) | RESPIRATORY_TRACT | Status: DC | PRN

## 2022-12-28 MED ORDER — MORPHINE SULFATE (PF) 2 MG/ML IV SOLN
2.0000 mg | INTRAVENOUS | Status: AC | PRN
  Administered 2022-12-28 – 2022-12-29 (×3): 2 mg via INTRAVENOUS
  Filled 2022-12-28 (×3): qty 1

## 2022-12-28 MED ORDER — LACTATED RINGERS IV SOLN: INTRAVENOUS | Status: AC

## 2022-12-28 MED ORDER — HYDROCODONE-ACETAMINOPHEN 5-325 MG PO TABS
1.0000 | ORAL_TABLET | Freq: Four times a day (QID) | ORAL | Status: DC | PRN
  Administered 2022-12-29: 1 via ORAL
  Administered 2022-12-29 – 2022-12-30 (×2): 2 via ORAL
  Filled 2022-12-28: qty 2
  Filled 2022-12-28: qty 1
  Filled 2022-12-28: qty 2

## 2022-12-28 MED ORDER — METHOCARBAMOL 1000 MG/10ML IJ SOLN: 500.0000 mg | Freq: Four times a day (QID) | INTRAMUSCULAR | Status: DC | PRN

## 2022-12-28 MED ORDER — BISACODYL 5 MG PO TBEC: 5.0000 mg | DELAYED_RELEASE_TABLET | Freq: Every day | ORAL | Status: DC | PRN

## 2022-12-28 MED ORDER — METHOCARBAMOL 500 MG PO TABS: 500.0000 mg | ORAL_TABLET | Freq: Four times a day (QID) | ORAL | Status: DC | PRN

## 2022-12-28 MED ORDER — DOCUSATE SODIUM 100 MG PO CAPS
100.0000 mg | ORAL_CAPSULE | Freq: Two times a day (BID) | ORAL | Status: DC
Start: 2022-12-28 — End: 2022-12-31
  Administered 2022-12-28 – 2022-12-30 (×4): 100 mg via ORAL
  Filled 2022-12-28 (×4): qty 1

## 2022-12-28 MED ORDER — POLYETHYLENE GLYCOL 3350 17 G PO PACK: 17.0000 g | PACK | Freq: Every day | ORAL | Status: DC | PRN

## 2022-12-28 MED ORDER — TRANEXAMIC ACID-NACL 1000-0.7 MG/100ML-% IV SOLN
1000.0000 mg | INTRAVENOUS | Status: AC
  Administered 2022-12-29: 1000 mg via INTRAVENOUS
  Filled 2022-12-28: qty 100

## 2022-12-28 MED ORDER — CEFAZOLIN SODIUM-DEXTROSE 2-4 GM/100ML-% IV SOLN
2.0000 g | Freq: Once | INTRAVENOUS | Status: AC
  Administered 2022-12-29: 2 g via INTRAVENOUS

## 2022-12-28 NOTE — ED Triage Notes (Signed)
Pt bib [facility] from home due to a fall. Pt was using her walking to get to the bathroom and fell on her right side. Pt denies dizziness or LOC prior to the fall. Pt has R leg rotation and shortening. Pt did hit  R side of head. Pt is on plavix.  219/80 60 hr 96% 4 L ( pt wears 4 L on baseline) fent

## 2022-12-28 NOTE — ED Provider Notes (Signed)
[facility] Provider Note    Event Date/Time   First MD Initiated Contact with Patient 12/28/22 1506     (approximate)   History   Fall   HPI  Kristi Clark is a 72 y.o. female   Past medical history of multiple medical comorbidities including COPD on chronic O2, recent pneumonia discharged in September 2024, on home hospice who presents to the Emergency Department with mechanical fall.  Misplaced her step while ambulating with walker and fell onto her right side.  No head strike or loss of consciousness does endorse right lateral thigh pain.  Was on the ground for several minutes before family found her.  She denies any presyncopal symptoms otherwise been in her regular state of health with no acute medical complaints otherwise.  Independent Historian contributed to assessment above: There is a daughter at bedside to corroborate information past medical history as above  External Medical Documents Reviewed: Discharge summary from September 2024 hospitalization for pneumonia sent home on home hospice      Physical Exam   Triage Vital Signs: ED Triage Vitals  Encounter Vitals Group     BP 12/28/22 1509 (!) 198/52     Systolic BP Percentile --      Diastolic BP Percentile --      Pulse Rate 12/28/22 1509 (!) 59     Resp 12/28/22 1509 18     Temp 12/28/22 1459 98.4 F (36.9 C)     Temp Source 12/28/22 1459 Oral     SpO2 12/28/22 1509 100 %     Weight 12/28/22 1501 110 lb (49.9 kg)     Height 12/28/22 1501 4\' 11"  (1.499 m)     Head Circumference --      Peak Flow --      Pain Score 12/28/22 1500 9     Pain Loc --      Pain Education --      Exclude from Growth Chart --     Most recent vital signs: Vitals:   12/28/22 2018 12/28/22 2020  BP: (!) 199/51 (!) 187/49  Pulse: 70 74  Resp: 18   Temp: 98 F (36.7 C)   SpO2: 95%     General: Awake, no distress.  CV:  Good peripheral perfusion.  Resp:  Normal effort.  Abd:  No distention.   Other:  Awake alert comfortable appearing, she does have externally rotated and shortened right lower extremity, neurovascular intact with tenderness palpation around the right hip and thigh.  Able to fully range the other extremities, no obvious signs of trauma there nor the thorax abdomen or back, no obvious signs of head trauma, and neck supple full range of motion no midline tenderness.   ED Results / Procedures / Treatments   Labs (all labs ordered are listed, but only abnormal results are displayed) Labs Reviewed  BASIC METABOLIC PANEL - Abnormal; Notable for the following components:      Result Value   Chloride 97 (*)    CO2 33 (*)    Glucose, Bld 115 (*)    All other components within normal limits  CBC WITH DIFFERENTIAL/PLATELET - Abnormal; Notable for the following components:   RBC 3.63 (*)    Hemoglobin 11.0 (*)    HCT 35.7 (*)    All other components within normal limits  CBC  BASIC METABOLIC PANEL  TYPE AND SCREEN    EKG  ED ECG REPORT I, Pilar Jarvis, the attending physician, personally  viewed and interpreted this ECG.   Date: 12/28/2022  EKG Time: 1508  Rate: 61  Rhythm: sinus  Axis: nl  Intervals:none  ST&T Change: no stemi    RADIOLOGY I independently reviewed and interpreted CT scan of the head and see no obvious bleeding or midline shift I also reviewed radiologist's formal read.   PROCEDURES:  Critical Care performed: No  Procedures   MEDICATIONS ORDERED IN ED: Medications  morphine (PF) 2 MG/ML injection 2 mg (2 mg Intravenous Given by Other 12/28/22 2043)  HYDROcodone-acetaminophen (NORCO/VICODIN) 5-325 MG per tablet 1-2 tablet (has no administration in time range)  methocarbamol (ROBAXIN) tablet 500 mg (has no administration in time range)    Or  methocarbamol (ROBAXIN) injection 500 mg (has no administration in time range)  docusate sodium (COLACE) capsule 100 mg (100 mg Oral Given 12/28/22 2253)  polyethylene glycol (MIRALAX /  GLYCOLAX) packet 17 g (has no administration in time range)  bisacodyl (DULCOLAX) EC tablet 5 mg (has no administration in time range)  ceFAZolin (ANCEF) IVPB 2g/100 mL premix (has no administration in time range)  tranexamic acid (CYKLOKAPRON) IVPB 1,000 mg (has no administration in time range)  albuterol (VENTOLIN HFA) 108 (90 Base) MCG/ACT inhaler 1-2 puff (has no administration in time range)  lactated ringers infusion (has no administration in time range)  fentaNYL (SUBLIMAZE) injection 50 mcg (50 mcg Intravenous Given 12/28/22 1556)    External physician / consultants:  I spoke with orthopedist and hospitalist regarding care plan for this patient.   IMPRESSION / MDM / ASSESSMENT AND PLAN / ED COURSE  I reviewed the triage vital signs and the nursing notes.                                Patient's presentation is most consistent with acute presentation with potential threat to life or bodily function.  Differential diagnosis includes, but is not limited to, hip fracture dislocation, ICH or skull fracture, C-spine fracture dislocation for blunt traumatic injury for mechanical fall   The patient is on the cardiac monitor to evaluate for evidence of arrhythmia and/or significant heart rate changes.  MDM:    Mechanical fall, no presyncopal symptoms or other acute medical complaints to suggest medical reason for her fall, with suspicion for hip fracture dislocation given deformity to the right lower extremity.  Check CT head and neck to rule out ICH skull fracture or C-spine injury in this elderly patient with fall.  Pain control with IV narcotics.     Hip fracture, admission.        FINAL CLINICAL IMPRESSION(S) / ED DIAGNOSES   Final diagnoses:  Falls, initial encounter  Closed right hip fracture, initial encounter (HCC)     Rx / DC Orders   ED Discharge Orders     None        Note:  This document was prepared using Dragon voice recognition software and may  include unintentional dictation errors.    Pilar Jarvis, MD 12/28/22 [phone]

## 2022-12-28 NOTE — Consult Note (Signed)
ORTHOPAEDIC CONSULTATION  REQUESTING PHYSICIAN: Delfino Lovett, MD  ASSESSMENT AND PLAN: 72 y.o. female with the following: Right Hip Intertrochanteric femur fracture  This patient requires inpatient admission to the hospitalist, to include preoperative clearance and perioperative medical management  - Weight Bearing Status/Activity: NWB Right lower extremity  - Additional recommended labs/tests: Preop Labs: CBC, BMP, PT/INR, Chest XR, and EKG  -VTE Prophylaxis: Please hold prior to OR; to resume POD#1 at the discretion of the primary team  - Pain control: Recommend PO pain medications PRN; judicious use of narcotics  - Follow-up plan: F/u 10-14 days postop  -Procedures: Plan for OR once patient has been medically optimized  Plan for Right Hip Cephalomedullary nail     Chief Complaint: Right hip pain  HPI: Kristi Clark is a 72 y.o. female who presented to the ED for evaluation after sustaining a mechanical fall.  She was recently discharged from the hospital following pneumonia.  She is on home hospice.  She walks with a walker.  She tripped and fell yesterday while using her walker, and fell on her right side.  She did not lose consciousness, but did hit her head.  In the ED, imaging was negative except for the right hip which demonstrated an intertroch fracture.  She has pain with small movements.   She is having some spasm pain.  No numbness or tingling.   Past Medical History:  Diagnosis Date   AK (actinic keratosis) 10/25/2017   left forehead   Arrhythmia    atrial fibrillation   BCC (basal cell carcinoma) 10/25/2017   left upper lateral eyelid, Moh's   CHF (congestive heart failure) (HCC)    COPD (chronic obstructive pulmonary disease) (HCC)    Coronary artery disease    Depression    Dysrhythmia    GERD (gastroesophageal reflux disease)    Hyperlipidemia    Hypertension    Menopause    Osteopenia    Severe sepsis (HCC) 07/14/2014   Tobacco abuse    Past  Surgical History:  Procedure Laterality Date   CARDIAC CATHETERIZATION     CHOLECYSTECTOMY  2014   COLONOSCOPY WITH PROPOFOL N/A 01/16/2018   Procedure: COLONOSCOPY WITH PROPOFOL;  Surgeon: Christena Deem, MD;  Location: [facility] ENDOSCOPY;  Service: Endoscopy;  Laterality: N/A;   CORONARY ANGIOPLASTY     ESOPHAGOGASTRODUODENOSCOPY (EGD) WITH PROPOFOL N/A 01/16/2018   Procedure: ESOPHAGOGASTRODUODENOSCOPY (EGD) WITH PROPOFOL;  Surgeon: Christena Deem, MD;  Location: [facility] ENDOSCOPY;  Service: Endoscopy;  Laterality: N/A;   heart stent     LEFT HEART CATH AND CORONARY ANGIOGRAPHY N/A 09/17/2021   Procedure: LEFT HEART CATH AND CORONARY ANGIOGRAPHY;  Surgeon: Laurier Nancy, MD;  Location: [facility] INVASIVE CV LAB;  Service: Cardiovascular;  Laterality: N/A;   TEE WITHOUT CARDIOVERSION N/A 05/11/2021   Procedure: TRANSESOPHAGEAL ECHOCARDIOGRAM (TEE);  Surgeon: Lamar Blinks, MD;  Location: [facility] ORS;  Service: Cardiovascular;  Laterality: N/A;   TOTAL ABDOMINAL HYSTERECTOMY     Social History   Socioeconomic History   Marital status: Widowed    Spouse name: Not on file   Number of children: 2   Years of education: Not on file   Highest education level: 10th grade  Occupational History   Occupation: retired  Tobacco Use   Smoking status: Former    Current packs/day: 1.00    Average packs/day: 1 pack/day for 56.0 years (56.0 ttl pk-yrs)    Types: Cigarettes   Smokeless tobacco: Never  Vaping Use   Vaping  status: Never Used  Substance and Sexual Activity   Alcohol use: No   Drug use: Never   Sexual activity: Never  Other Topics Concern   Not on file  Social History Narrative   ** Merged History Encounter **       Lives with great grandson, Charise Killian   Social Determinants of Health   Financial Resource Strain: Low Risk  (06/13/2022)   Overall Financial Resource Strain (CARDIA)    Difficulty of Paying Living Expenses: Not very hard  Food Insecurity: No Food Insecurity  (12/28/2022)   Hunger Vital Sign    Worried About Running Out of Food in the Last Year: Never true    Ran Out of Food in the Last Year: Never true  Transportation Needs: No Transportation Needs (12/28/2022)   PRAPARE - Administrator, Civil Service (Medical): No    Lack of Transportation (Non-Medical): No  Physical Activity: Insufficiently Active (06/13/2022)   Exercise Vital Sign    Days of Exercise per Week: 3 days    Minutes of Exercise per Session: 30 min  Stress: No Stress Concern Present (06/13/2022)   [employer] of Occupational Health - Occupational Stress Questionnaire    Feeling of Stress : Only a little  Social Connections: Socially Isolated (06/13/2022)   Social Connection and Isolation Panel [NHANES]    Frequency of Communication with Friends and Family: More than three times a week    Frequency of Social Gatherings with Friends and Family: More than three times a week    Attends Religious Services: Never    Database administrator or Organizations: No    Attends Banker Meetings: Never    Marital Status: Widowed   Family History  Problem Relation Age of Onset   Cancer Mother    Stroke Mother    Heart disease Father    Hyperlipidemia Father    Cancer Sister    Diabetes Sister    Breast cancer Sister 65   Diabetes Brother    Asthma Son    Cancer Son    Diabetes Daughter    Hypertension Daughter    Cancer Maternal Grandmother        gallbladder   Diabetes Brother    Heart disease Brother    Breast cancer Paternal Aunt    Allergies  Allergen Reactions   Clindamycin/Lincomycin Hives   Prednisone Other (See Comments)    Psychosis   Amoxicillin Rash   Avelox [Moxifloxacin Hcl In Nacl] Rash   Codeine Sulfate Nausea Only   Penicillins Rash   Sulfa Antibiotics Rash   Prior to Admission medications   Medication Sig Start Date End Date Taking? Authorizing Provider  albuterol (PROVENTIL) (2.5 MG/3ML) 0.083% nebulizer solution Take 3 mLs  (2.5 mg total) by nebulization every 4 (four) hours as needed for wheezing or shortness of breath. 08/25/22   Laural Benes, Megan P, DO  albuterol (VENTOLIN HFA) 108 (90 Base) MCG/ACT inhaler Inhale 2 puffs into the lungs every 6 (six) hours as needed for wheezing or shortness of breath. 08/25/22   Johnson, Megan P, DO  Aloe-Sodium Chloride (AYR SALINE NASAL GEL NA) Place 1 Application into the nose as needed (dry nares).    [provider]  ALPRAZolam Prudy Feeler) 0.25 MG tablet Take 1 tablet (0.25 mg total) by mouth 2 (two) times daily as needed for anxiety. 12/09/22   Tresa Moore, MD  Blood Pressure Monitoring (BLOOD PRESSURE MONITOR/WRIST) KIT Take Blood pressure as needed, Dx: Z61.0  12/09/20   Johnson, Megan P, DO  fluticasone (FLONASE) 50 MCG/ACT nasal spray Place 2 sprays into both nostrils daily. Patient taking differently: Place 2 sprays into both nostrils daily as needed. 11/10/22   Mecum, Erin E, PA-C  Fluticasone-Umeclidin-Vilant (TRELEGY ELLIPTA) 100-62.5-25 MCG/ACT AEPB 100 mcg DAILY (route: inhalation) 08/25/22   Olevia Perches P, DO  furosemide (LASIX) 20 MG tablet AS needed 10/11/22   Laurier Nancy, MD  guaiFENesin (MUCINEX) 600 MG 12 hr tablet Take 1 tablet (600 mg total) by mouth 2 (two) times daily. 12/09/22   Tresa Moore, MD  isosorbide mononitrate (IMDUR) 60 MG 24 hr tablet Take 1 tablet (60 mg total) by mouth 2 (two) times daily. Patient taking differently: Take 60 mg by mouth daily. 08/25/22   Johnson, Megan P, DO  metoprolol succinate (TOPROL-XL) 50 MG 24 hr tablet Take 1 tablet (50 mg total) by mouth daily. Take with or immediately following a meal. 08/25/22   Johnson, Megan P, DO  mirtazapine (REMERON) 45 MG tablet Take 1 tablet (45 mg total) by mouth at bedtime. 08/25/22   Johnson, Megan P, DO  oxyCODONE (OXY IR/ROXICODONE) 5 MG immediate release tablet Take 1-2 tablets (5-10 mg total) by mouth every 4 (four) hours as needed for moderate pain or severe pain. 12/09/22    Tresa Moore, MD  pantoprazole (PROTONIX) 40 MG tablet Take 1 tablet (40 mg total) by mouth 2 (two) times daily. 08/25/22 08/25/23  Olevia Perches P, DO  phenazopyridine (PYRIDIUM) 100 MG tablet Take 1 tablet (100 mg total) by mouth 3 (three) times daily with meals. 12/09/22   Sreenath, Sudheer B, MD  polyethylene glycol powder (GLYCOLAX/MIRALAX) 17 GM/SCOOP powder Take 17 g by mouth 3 (three) times daily. Patient taking differently: Take 17 g by mouth daily as needed. 03/03/22   Johnson, Megan P, DO  QUEtiapine (SEROQUEL XR) 50 MG TB24 24 hr tablet Take 2 tablets (100 mg total) by mouth at bedtime. 08/25/22   Johnson, Megan P, DO  tamsulosin (FLOMAX) 0.4 MG CAPS capsule Take 1 capsule (0.4 mg total) by mouth daily. 12/09/22   Tresa Moore, MD   CT Head Wo Contrast  Result Date: 12/28/2022 CLINICAL DATA:  Recent fall with headaches and neck pain, initial encounter EXAM: CT HEAD WITHOUT CONTRAST CT CERVICAL SPINE WITHOUT CONTRAST TECHNIQUE: Multidetector CT imaging of the head and cervical spine was performed following the standard protocol without intravenous contrast. Multiplanar CT image reconstructions of the cervical spine were also generated. RADIATION DOSE REDUCTION: This exam was performed according to the departmental dose-optimization program which includes automated exposure control, adjustment of the mA and/or kV according to patient size and/or use of iterative reconstruction technique. COMPARISON:  04/28/2021 FINDINGS: CT HEAD FINDINGS Brain: No evidence of acute infarction, hemorrhage, hydrocephalus, extra-axial collection or mass lesion/mass effect. Chronic white matter ischemic changes are again noted. Vascular: No hyperdense vessel or unexpected calcification. Skull: Normal. Negative for fracture or focal lesion. Sinuses/Orbits: No acute finding. Other: None. CT CERVICAL SPINE FINDINGS Alignment: Within normal limits. Skull base and vertebrae: 7 cervical segments are well  visualized. Vertebral body height is well maintained. Facet hypertrophic changes are noted. No acute fracture or acute facet abnormality is noted. The odontoid is within normal limits. Soft tissues and spinal canal: Surrounding soft tissue structures are within normal limits. Upper chest: Visualized lung apices no significant emphysematous change. Biapical pleural and parenchymal scarring is noted stable from the prior exam. Other: None IMPRESSION: CT of the head: Chronic  white matter ischemic changes without acute abnormality. CT of the cervical spine: Degenerative change without acute bony abnormality. Electronically Signed   By: Alcide Clever M.D.   On: 12/28/2022 18:55   CT Cervical Spine Wo Contrast  Result Date: 12/28/2022 CLINICAL DATA:  Recent fall with headaches and neck pain, initial encounter EXAM: CT HEAD WITHOUT CONTRAST CT CERVICAL SPINE WITHOUT CONTRAST TECHNIQUE: Multidetector CT imaging of the head and cervical spine was performed following the standard protocol without intravenous contrast. Multiplanar CT image reconstructions of the cervical spine were also generated. RADIATION DOSE REDUCTION: This exam was performed according to the departmental dose-optimization program which includes automated exposure control, adjustment of the mA and/or kV according to patient size and/or use of iterative reconstruction technique. COMPARISON:  04/28/2021 FINDINGS: CT HEAD FINDINGS Brain: No evidence of acute infarction, hemorrhage, hydrocephalus, extra-axial collection or mass lesion/mass effect. Chronic white matter ischemic changes are again noted. Vascular: No hyperdense vessel or unexpected calcification. Skull: Normal. Negative for fracture or focal lesion. Sinuses/Orbits: No acute finding. Other: None. CT CERVICAL SPINE FINDINGS Alignment: Within normal limits. Skull base and vertebrae: 7 cervical segments are well visualized. Vertebral body height is well maintained. Facet hypertrophic changes are  noted. No acute fracture or acute facet abnormality is noted. The odontoid is within normal limits. Soft tissues and spinal canal: Surrounding soft tissue structures are within normal limits. Upper chest: Visualized lung apices no significant emphysematous change. Biapical pleural and parenchymal scarring is noted stable from the prior exam. Other: None IMPRESSION: CT of the head: Chronic white matter ischemic changes without acute abnormality. CT of the cervical spine: Degenerative change without acute bony abnormality. Electronically Signed   By: Alcide Clever M.D.   On: 12/28/2022 18:55   DG Chest 1 View  Result Date: 12/28/2022 CLINICAL DATA:  Status post fall with right hip fracture EXAM: CHEST  1 VIEW COMPARISON:  12/02/2022 FINDINGS: Improving opacity in the right upper lobe, mild residual streaky opacities may represent scarring. Stable cardiomegaly. Unchanged mediastinal contours. Persistent blunting of right costophrenic angle. No pneumothorax. No acute osseous abnormalities are seen. IMPRESSION: 1. Improving opacity in the right upper lobe, mild residual streaky opacities may represent scarring. 2. Stable cardiomegaly. 3. Persistent blunting of right costophrenic angle, may represent scarring or small effusion. Electronically Signed   By: Narda Rutherford M.D.   On: 12/28/2022 17:51   DG Pelvis 1-2 Views  Result Date: 12/28/2022 CLINICAL DATA:  Status post fall onto right side with pain. EXAM: PELVIS - 1-2 VIEW; RIGHT FEMUR 2 VIEWS COMPARISON:  None Available. FINDINGS: Pelvis: Comminuted and displaced intertrochanteric right proximal femur fracture. There is involvement of the lesser and greater trochanters. Femoral head remains seated, no hip dislocation. Intact pubic rami. No pubic symphyseal or sacroiliac diastasis. Femur: The distal femur is intact. No additional distal femur fracture. Knee alignment is maintained. IMPRESSION: Comminuted and displaced intertrochanteric right proximal femur  fracture. Electronically Signed   By: Narda Rutherford M.D.   On: 12/28/2022 17:40   DG FEMUR, MIN 2 VIEWS RIGHT  Result Date: 12/28/2022 CLINICAL DATA:  Status post fall onto right side with pain. EXAM: PELVIS - 1-2 VIEW; RIGHT FEMUR 2 VIEWS COMPARISON:  None Available. FINDINGS: Pelvis: Comminuted and displaced intertrochanteric right proximal femur fracture. There is involvement of the lesser and greater trochanters. Femoral head remains seated, no hip dislocation. Intact pubic rami. No pubic symphyseal or sacroiliac diastasis. Femur: The distal femur is intact. No additional distal femur fracture. Knee alignment is maintained.  IMPRESSION: Comminuted and displaced intertrochanteric right proximal femur fracture. Electronically Signed   By: Narda Rutherford M.D.   On: 12/28/2022 17:40    Family History Reviewed and non-contributory, no pertinent history of problems with bleeding or anesthesia    Review of Systems No fevers or chills No numbness or tingling No chest pain No shortness of breath No bowel or bladder dysfunction No GI distress No headaches No LOC   OBJECTIVE  Vitals:Patient Vitals for the past 8 hrs:  BP Temp Temp src Pulse Resp SpO2  12/29/22 0457 (!) 146/32 98.4 F (36.9 C) Oral 68 18 99 %  12/29/22 0353 (!) 176/43 -- -- 62 -- --  12/29/22 0226 (!) 154/36 98.2 F (36.8 C) Oral 66 -- 99 %   General: Alert, no acute distress; tearful, has some pain Cardiovascular: Extremities are warm Respiratory: No cyanosis, no use of accessory musculature Skin: No lesions in the area of chief complaint  Neurologic: Sensation intact distally  Psychiatric: Patient is competent for consent with normal mood and affect Lymphatic: No swelling obvious and reported other than the area involved in the exam below Extremities  RLE: Extremity held in a fixed position.  ROM deferred due to known fracture.  Sensation is intact distally in the sural, saphenous, DP, SP, and plantar nerve  distribution. 2+ DP pulse.  Toes are WWP.  Active motion intact in the TA/EHL/GS. LLE: Sensation is intact distally in the sural, saphenous, DP, SP, and plantar nerve distribution. 2+ DP pulse.  Toes are WWP.  Active motion intact in the TA/EHL/GS. Tolerates gentle ROM of the hip.  No pain with axial loading.     Test Results Imaging XR of the Right hip demonstrates a comminuted Intertrochanteric femur fracture.  Labs cbc    Latest Ref Rng & Units 12/29/2022    7:06 AM 12/28/2022    3:11 PM 12/07/2022    5:02 AM  CBC  WBC 4.0 - 10.5 K/uL 6.6  5.6  8.7   Hemoglobin 12.0 - 15.0 g/dL 62.7  03.5  9.5   Hematocrit 36.0 - 46.0 % 31.0  35.7  28.7   Platelets 150 - 400 K/uL 175  209  279       Recent Labs    12/28/22 1511  NA 138  K 4.0  CL 97*  CO2 33*  GLUCOSE 115*  BUN 16  CREATININE 0.86  CALCIUM 9.1

## 2022-12-28 NOTE — Hospital Course (Addendum)
Called head scan normal. Hip fracture ortho on board. Labs are pending. Vitals good. Anemia chronic.  Crp elevated./  Right hip fracture.  Creatinine is ok lft is mildly high.

## 2022-12-28 NOTE — ED Notes (Signed)
Pt to CT

## 2022-12-28 NOTE — H&P (Signed)
History and Physical    Patient: Kristi Clark MWN:[id number] DOB: [birth year] DOA: 12/28/2022 DOS: the patient was seen and examined on 12/29/2022 PCP: Dorcas Carrow, DO  Patient coming from: Home  Chief Complaint:  Chief Complaint  Patient presents with   Fall    HPI: Kristi Clark is a 72 y.o. 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  Initial vitals show heart rate of 59 respirations of 18 afebrile O2 sats of 100% on room air.  Initial EKG shows sinus rhythm with no ST-T wave changes or prolonged QT's intervals are within normal limits, patient was given morphine for her pain on initial presentation,  In emergency room vitals show Blood pressure (!) 154/36, pulse 66, temperature 98.2 F (36.8 C), temperature source Oral, resp. rate 18, height 4\' 11"  (1.499 m), weight 49.9 kg, SpO2 99%. Patient was seen by orthopedics Dr. Dallas Schimke  in the ED emergency room for her right hip fracture and anticipate surgical repair tomorrow.  Patient has past medical history of interstitial lung disease and severe COPD along with past medical history of heart disease.  Chart review shows patient was last seen by Dr. Charlsie Quest of pulmonology on the fourth. Patient was also seen by cardiologist Dr. Welton Flakes in August of this year for her acute on chronic diastolic congestive heart failure, A-fib RVR not on anticoagulation.  Labs are notable for metabolic panel showing normal kidney  function, glucose of 115, CBC showing mild anemia with a hemoglobin of 11 and normal platelet counts, CT C-spine shows degenerative changes without any acute bony abnormality and CT head noncontrast chest shows chronic white matter ischemic changes within without any acute abnormality chest x-ray shows improving opacity in the right upper lobe, mild residual streaky opacities which is concerning for scarring stable cardiomegaly and persistent blunting of the right costophrenic angle which may be pleural effusion, femur x-ray shows comminuted and displaced intertrochanteric right proximal femur fractureInitial EKG again shows sinus rhythm with QT of 461 PR 183 QRS of 102 upright axis nonspecific ST changes in aVF.  Pt received: Medications  morphine (PF) 2 MG/ML injection 2 mg (2 mg Intravenous Given by Other 12/28/22 2043)  HYDROcodone-acetaminophen (NORCO/VICODIN) 5-325 MG per tablet 1-2 tablet (1 tablet Oral Given 12/29/22 0000)  methocarbamol (ROBAXIN) tablet 500 mg (has no administration in time range)    Or  methocarbamol (ROBAXIN) injection 500 mg (has no administration in time range)  docusate sodium (COLACE) capsule 100 mg (100 mg Oral Given 12/28/22 2253)  polyethylene glycol (MIRALAX / GLYCOLAX) packet 17 g (has no administration in time range)  bisacodyl (DULCOLAX) EC tablet 5 mg (has no administration in time range)  ceFAZolin (ANCEF) IVPB 2g/100 mL premix (has no administration in time range)  tranexamic acid (CYKLOKAPRON) IVPB 1,000 mg (has no administration in time range)  albuterol (VENTOLIN HFA) 108 (90 Base) MCG/ACT inhaler 1-2 puff (has no administration in time range)  lactated ringers infusion (has no administration in time range)  hydrALAZINE (  APRESOLINE) injection 10 mg (has no administration in time range)  fentaNYL (SUBLIMAZE) injection 50 mcg (50 mcg Intravenous Given 12/28/22 1556)   Review of Systems  Unable to perform ROS: Age (LTD due to age but pt c/o rt hip pain.)   Musculoskeletal:  Positive for joint pain.   Past Medical History:  Diagnosis Date   AK (actinic keratosis) 10/25/2017   left forehead   Arrhythmia    atrial fibrillation   BCC (basal cell carcinoma) 10/25/2017   left upper lateral eyelid, Moh's   CHF (congestive heart failure) (HCC)    COPD (chronic obstructive pulmonary disease) (HCC)    Coronary artery disease    Depression    Dysrhythmia    GERD (gastroesophageal reflux disease)    Hyperlipidemia    Hypertension    Menopause    Osteopenia    Severe sepsis (HCC) 07/14/2014   Tobacco abuse    Past Surgical History:  Procedure Laterality Date   CARDIAC CATHETERIZATION     CHOLECYSTECTOMY  2014   COLONOSCOPY WITH PROPOFOL N/A 01/16/2018   Procedure: COLONOSCOPY WITH PROPOFOL;  Surgeon: Christena Deem, MD;  Location: [facility] ENDOSCOPY;  Service: Endoscopy;  Laterality: N/A;   CORONARY ANGIOPLASTY     ESOPHAGOGASTRODUODENOSCOPY (EGD) WITH PROPOFOL N/A 01/16/2018   Procedure: ESOPHAGOGASTRODUODENOSCOPY (EGD) WITH PROPOFOL;  Surgeon: Christena Deem, MD;  Location: [facility] ENDOSCOPY;  Service: Endoscopy;  Laterality: N/A;   heart stent     LEFT HEART CATH AND CORONARY ANGIOGRAPHY N/A 09/17/2021   Procedure: LEFT HEART CATH AND CORONARY ANGIOGRAPHY;  Surgeon: Laurier Nancy, MD;  Location: [facility] INVASIVE CV LAB;  Service: Cardiovascular;  Laterality: N/A;   TEE WITHOUT CARDIOVERSION N/A 05/11/2021   Procedure: TRANSESOPHAGEAL ECHOCARDIOGRAM (TEE);  Surgeon: Lamar Blinks, MD;  Location: [facility] ORS;  Service: Cardiovascular;  Laterality: N/A;   TOTAL ABDOMINAL HYSTERECTOMY      reports that she has quit smoking. Her smoking use included cigarettes. She has a 56 pack-year smoking history. She has never used smokeless tobacco. She reports that she does not drink alcohol and does not use drugs.  Allergies  Allergen Reactions   Clindamycin/Lincomycin Hives   Prednisone Other (See Comments)    Psychosis   Amoxicillin Rash    Avelox [Moxifloxacin Hcl In Nacl] Rash   Codeine Sulfate Nausea Only   Penicillins Rash   Sulfa Antibiotics Rash    Family History  Problem Relation Age of Onset   Cancer Mother    Stroke Mother    Heart disease Father    Hyperlipidemia Father    Cancer Sister    Diabetes Sister    Breast cancer Sister 33   Diabetes Brother    Asthma Son    Cancer Son    Diabetes Daughter    Hypertension Daughter    Cancer Maternal Grandmother        gallbladder   Diabetes Brother    Heart disease Brother    Breast cancer Paternal Aunt     Prior to Admission medications   Medication Sig Start Date End Date Taking? Authorizing Provider  albuterol (PROVENTIL) (2.5 MG/3ML) 0.083% nebulizer solution Take 3 mLs (2.5 mg total) by nebulization every 4 (four) hours as needed for wheezing or shortness of breath. 08/25/22   Laural Benes, Megan P, DO  albuterol (VENTOLIN HFA) 108 (90 Base) MCG/ACT inhaler Inhale 2 puffs into the lungs every 6 (six) hours as needed for wheezing or shortness of breath. 08/25/22   Olevia Perches P, DO  Aloe-Sodium Chloride (AYR SALINE NASAL GEL NA) Place 1 Application into the nose as needed (dry nares).    [provider]  ALPRAZolam Prudy Feeler) 0.25 MG tablet Take 1 tablet (0.25 mg total) by mouth 2 (two) times daily as needed for anxiety. 12/09/22   Tresa Moore, MD  Blood Pressure Monitoring (BLOOD PRESSURE MONITOR/WRIST) KIT Take Blood pressure as needed, Dx: I12.9 12/09/20   Johnson, Megan P, DO  fluticasone (FLONASE) 50 MCG/ACT nasal spray Place 2 sprays into both nostrils daily. Patient taking differently: Place 2 sprays into both nostrils daily as needed. 11/10/22   Mecum, Erin E, PA-C  Fluticasone-Umeclidin-Vilant (TRELEGY ELLIPTA) 100-62.5-25 MCG/ACT AEPB 100 mcg DAILY (route: inhalation) 08/25/22   Olevia Perches P, DO  furosemide (LASIX) 20 MG tablet AS needed 10/11/22   Laurier Nancy, MD  guaiFENesin (MUCINEX) 600 MG 12 hr tablet Take 1 tablet (600 mg total) by  mouth 2 (two) times daily. 12/09/22   Tresa Moore, MD  isosorbide mononitrate (IMDUR) 60 MG 24 hr tablet Take 1 tablet (60 mg total) by mouth 2 (two) times daily. Patient taking differently: Take 60 mg by mouth daily. 08/25/22   Johnson, Megan P, DO  metoprolol succinate (TOPROL-XL) 50 MG 24 hr tablet Take 1 tablet (50 mg total) by mouth daily. Take with or immediately following a meal. 08/25/22   Johnson, Megan P, DO  mirtazapine (REMERON) 45 MG tablet Take 1 tablet (45 mg total) by mouth at bedtime. 08/25/22   Johnson, Megan P, DO  oxyCODONE (OXY IR/ROXICODONE) 5 MG immediate release tablet Take 1-2 tablets (5-10 mg total) by mouth every 4 (four) hours as needed for moderate pain or severe pain. 12/09/22   Tresa Moore, MD  pantoprazole (PROTONIX) 40 MG tablet Take 1 tablet (40 mg total) by mouth 2 (two) times daily. 08/25/22 08/25/23  Olevia Perches P, DO  phenazopyridine (PYRIDIUM) 100 MG tablet Take 1 tablet (100 mg total) by mouth 3 (three) times daily with meals. 12/09/22   Sreenath, Sudheer B, MD  polyethylene glycol powder (GLYCOLAX/MIRALAX) 17 GM/SCOOP powder Take 17 g by mouth 3 (three) times daily. Patient taking differently: Take 17 g by mouth daily as needed. 03/03/22   Johnson, Megan P, DO  QUEtiapine (SEROQUEL XR) 50 MG TB24 24 hr tablet Take 2 tablets (100 mg total) by mouth at bedtime. 08/25/22   Johnson, Megan P, DO  tamsulosin (FLOMAX) 0.4 MG CAPS capsule Take 1 capsule (0.4 mg total) by mouth daily. 12/09/22   Lolita Patella B, MD     Vitals:   12/28/22 1930 12/28/22 2018 12/28/22 2020 12/29/22 0226  BP: (!) 188/43 (!) 199/51 (!) 187/49 (!) 154/36  Pulse: 66 70 74 66  Resp: 20 18    Temp: 98.2 F (36.8 C) 98 F (36.7 C)  98.2 F (36.8 C)  TempSrc: Oral   Oral  SpO2: 100% 95%  99%  Weight:      Height:       Physical Exam Vitals and nursing note reviewed.  Constitutional:      General: She is not in acute distress. HENT:     Head: Normocephalic and  atraumatic.     Right Ear: Hearing normal.     Left Ear: Hearing normal.     Nose: Nose normal. No nasal deformity.     Mouth/Throat:     Lips: Pink.     Tongue: No lesions.     Pharynx: Oropharynx is clear.  Eyes:  General: Lids are normal.     Extraocular Movements: Extraocular movements intact.     Pupils: Pupils are equal, round, and reactive to light.  Cardiovascular:     Rate and Rhythm: Normal rate and regular rhythm.     Heart sounds: Normal heart sounds.  Pulmonary:     Effort: Pulmonary effort is normal.     Breath sounds: Normal breath sounds.  Abdominal:     General: Bowel sounds are normal. There is no distension.     Palpations: Abdomen is soft. There is no mass.     Tenderness: There is no abdominal tenderness.  Musculoskeletal:        General: Tenderness and deformity present.     Right lower leg: No edema.     Left lower leg: No edema.  Skin:    General: Skin is warm.  Neurological:     General: No focal deficit present.     Mental Status: She is alert and oriented to person, place, and time.     Cranial Nerves: Cranial nerves 2-12 are intact.  Psychiatric:        Attention and Perception: Attention normal.        Mood and Affect: Mood normal.        Speech: Speech normal.        Behavior: Behavior normal. Behavior is cooperative.      Labs on Admission: I have personally reviewed following labs and imaging studies Results for orders placed or performed during the hospital encounter of 12/28/22 (from the past 24 hour(s))  Basic metabolic panel     Status: Abnormal   Collection Time: 12/28/22  3:11 PM  Result Value Ref Range   Sodium 138 135 - 145 mmol/L   Potassium 4.0 3.5 - 5.1 mmol/L   Chloride 97 (L) 98 - 111 mmol/L   CO2 33 (H) 22 - 32 mmol/L   Glucose, Bld 115 (H) 70 - 99 mg/dL   BUN 16 8 - 23 mg/dL   Creatinine, Ser 7.82 0.44 - 1.00 mg/dL   Calcium 9.1 8.9 - 95.6 mg/dL   GFR, Estimated >21 >30 mL/min   Anion gap 8 5 - 15  CBC with  Differential     Status: Abnormal   Collection Time: 12/28/22  3:11 PM  Result Value Ref Range   WBC 5.6 4.0 - 10.5 K/uL   RBC 3.63 (L) 3.87 - 5.11 MIL/uL   Hemoglobin 11.0 (L) 12.0 - 15.0 g/dL   HCT 86.5 (L) 78.4 - 69.6 %   MCV 98.3 80.0 - 100.0 fL   MCH 30.3 26.0 - 34.0 pg   MCHC 30.8 30.0 - 36.0 g/dL   RDW 29.5 28.4 - 13.2 %   Platelets 209 150 - 400 K/uL   nRBC 0.0 0.0 - 0.2 %   Neutrophils Relative % 48 %   Neutro Abs 2.7 1.7 - 7.7 K/uL   Lymphocytes Relative 41 %   Lymphs Abs 2.3 0.7 - 4.0 K/uL   Monocytes Relative 7 %   Monocytes Absolute 0.4 0.1 - 1.0 K/uL   Eosinophils Relative 3 %   Eosinophils Absolute 0.2 0.0 - 0.5 K/uL   Basophils Relative 1 %   Basophils Absolute 0.0 0.0 - 0.1 K/uL   Immature Granulocytes 0 %   Abs Immature Granulocytes 0.02 0.00 - 0.07 K/uL  Type and screen [facility]     Status: None   Collection Time: 12/28/22  8:36 PM  Result Value Ref  Range   ABO/RH(D) A NEG    Antibody Screen NEG    Sample Expiration      12/31/2022,2359 Performed at [facility], [address] [place], [place] [address]     CBC: Recent Labs  Lab 12/28/22 1511  WBC 5.6  NEUTROABS 2.7  HGB 11.0*  HCT 35.7*  MCV 98.3  PLT 209   Basic Metabolic Panel: Recent Labs  Lab 12/28/22 1511  NA 138  K 4.0  CL 97*  CO2 33*  GLUCOSE 115*  BUN 16  CREATININE 0.86  CALCIUM 9.1   GFR: Estimated Creatinine Clearance: 39.1 mL/min (by C-G formula based on SCr of 0.86 mg/dL). Liver Function Tests: No results for input(s): "AST", "ALT", "ALKPHOS", "BILITOT", "PROT", "ALBUMIN" in the last 168 hours. No results for input(s): "LIPASE", "AMYLASE" in the last 168 hours. No results for input(s): "AMMONIA" in the last 168 hours. Coagulation Profile: No results for input(s): "INR", "PROTIME" in the last 168 hours. Cardiac Enzymes: No results for input(s): "CKTOTAL", "CKMB", "CKMBINDEX", "TROPONINI" in the last 168 hours. BNP (last 3  results) No results for input(s): "PROBNP" in the last 8760 hours. HbA1C: No results for input(s): "HGBA1C" in the last 72 hours. CBG: No results for input(s): "GLUCAP" in the last 168 hours. Lipid Profile: No results for input(s): "CHOL", "HDL", "LDLCALC", "TRIG", "CHOLHDL", "LDLDIRECT" in the last 72 hours. Thyroid Function Tests: No results for input(s): "TSH", "T4TOTAL", "FREET4", "T3FREE", "THYROIDAB" in the last 72 hours. Anemia Panel: No results for input(s): "VITAMINB12", "FOLATE", "FERRITIN", "TIBC", "IRON", "RETICCTPCT" in the last 72 hours. Urinalysis    Component Value Date/Time   COLORURINE YELLOW (A) 04/18/2022 1537   APPEARANCEUR CLEAR (A) 04/18/2022 1537   APPEARANCEUR Clear 03/15/2022 1525   LABSPEC 1.017 04/18/2022 1537   PHURINE 6.0 04/18/2022 1537   GLUCOSEU NEGATIVE 04/18/2022 1537   HGBUR NEGATIVE 04/18/2022 1537   BILIRUBINUR NEGATIVE 04/18/2022 1537   BILIRUBINUR Negative 03/15/2022 1525   KETONESUR NEGATIVE 04/18/2022 1537   PROTEINUR 30 (A) 04/18/2022 1537   NITRITE NEGATIVE 04/18/2022 1537   LEUKOCYTESUR NEGATIVE 04/18/2022 1537   Unresulted Labs (From admission, onward)     Start     Ordered   12/29/22 0500  CBC  Tomorrow morning,   R        12/28/22 1927   12/29/22 0500  Basic metabolic panel  Tomorrow morning,   R        12/28/22 1927            Medications  morphine (PF) 2 MG/ML injection 2 mg (2 mg Intravenous Given by Other 12/28/22 2043)  HYDROcodone-acetaminophen (NORCO/VICODIN) 5-325 MG per tablet 1-2 tablet (1 tablet Oral Given 12/29/22 0000)  methocarbamol (ROBAXIN) tablet 500 mg (has no administration in time range)    Or  methocarbamol (ROBAXIN) injection 500 mg (has no administration in time range)  docusate sodium (COLACE) capsule 100 mg (100 mg Oral Given 12/28/22 2253)  polyethylene glycol (MIRALAX / GLYCOLAX) packet 17 g (has no administration in time range)  bisacodyl (DULCOLAX) EC tablet 5 mg (has no administration in  time range)  ceFAZolin (ANCEF) IVPB 2g/100 mL premix (has no administration in time range)  tranexamic acid (CYKLOKAPRON) IVPB 1,000 mg (has no administration in time range)  albuterol (VENTOLIN HFA) 108 (90 Base) MCG/ACT inhaler 1-2 puff (has no administration in time range)  lactated ringers infusion (has no administration in time range)  hydrALAZINE (APRESOLINE) injection 10 mg (has no administration in time range)  fentaNYL (  SUBLIMAZE) injection 50 mcg (50 mcg Intravenous Given 12/28/22 1556)    Radiological Exams on Admission: CT Head Wo Contrast  Result Date: 12/28/2022 CLINICAL DATA:  Recent fall with headaches and neck pain, initial encounter EXAM: CT HEAD WITHOUT CONTRAST CT CERVICAL SPINE WITHOUT CONTRAST TECHNIQUE: Multidetector CT imaging of the head and cervical spine was performed following the standard protocol without intravenous contrast. Multiplanar CT image reconstructions of the cervical spine were also generated. RADIATION DOSE REDUCTION: This exam was performed according to the departmental dose-optimization program which includes automated exposure control, adjustment of the mA and/or kV according to patient size and/or use of iterative reconstruction technique. COMPARISON:  04/28/2021 FINDINGS: CT HEAD FINDINGS Brain: No evidence of acute infarction, hemorrhage, hydrocephalus, extra-axial collection or mass lesion/mass effect. Chronic white matter ischemic changes are again noted. Vascular: No hyperdense vessel or unexpected calcification. Skull: Normal. Negative for fracture or focal lesion. Sinuses/Orbits: No acute finding. Other: None. CT CERVICAL SPINE FINDINGS Alignment: Within normal limits. Skull base and vertebrae: 7 cervical segments are well visualized. Vertebral body height is well maintained. Facet hypertrophic changes are noted. No acute fracture or acute facet abnormality is noted. The odontoid is within normal limits. Soft tissues and spinal canal: Surrounding soft  tissue structures are within normal limits. Upper chest: Visualized lung apices no significant emphysematous change. Biapical pleural and parenchymal scarring is noted stable from the prior exam. Other: None IMPRESSION: CT of the head: Chronic white matter ischemic changes without acute abnormality. CT of the cervical spine: Degenerative change without acute bony abnormality. Electronically Signed   By: Alcide Clever M.D.   On: 12/28/2022 18:55   CT Cervical Spine Wo Contrast  Result Date: 12/28/2022 CLINICAL DATA:  Recent fall with headaches and neck pain, initial encounter EXAM: CT HEAD WITHOUT CONTRAST CT CERVICAL SPINE WITHOUT CONTRAST TECHNIQUE: Multidetector CT imaging of the head and cervical spine was performed following the standard protocol without intravenous contrast. Multiplanar CT image reconstructions of the cervical spine were also generated. RADIATION DOSE REDUCTION: This exam was performed according to the departmental dose-optimization program which includes automated exposure control, adjustment of the mA and/or kV according to patient size and/or use of iterative reconstruction technique. COMPARISON:  04/28/2021 FINDINGS: CT HEAD FINDINGS Brain: No evidence of acute infarction, hemorrhage, hydrocephalus, extra-axial collection or mass lesion/mass effect. Chronic white matter ischemic changes are again noted. Vascular: No hyperdense vessel or unexpected calcification. Skull: Normal. Negative for fracture or focal lesion. Sinuses/Orbits: No acute finding. Other: None. CT CERVICAL SPINE FINDINGS Alignment: Within normal limits. Skull base and vertebrae: 7 cervical segments are well visualized. Vertebral body height is well maintained. Facet hypertrophic changes are noted. No acute fracture or acute facet abnormality is noted. The odontoid is within normal limits. Soft tissues and spinal canal: Surrounding soft tissue structures are within normal limits. Upper chest: Visualized lung apices no  significant emphysematous change. Biapical pleural and parenchymal scarring is noted stable from the prior exam. Other: None IMPRESSION: CT of the head: Chronic white matter ischemic changes without acute abnormality. CT of the cervical spine: Degenerative change without acute bony abnormality. Electronically Signed   By: Alcide Clever M.D.   On: 12/28/2022 18:55   DG Chest 1 View  Result Date: 12/28/2022 CLINICAL DATA:  Status post fall with right hip fracture EXAM: CHEST  1 VIEW COMPARISON:  12/02/2022 FINDINGS: Improving opacity in the right upper lobe, mild residual streaky opacities may represent scarring. Stable cardiomegaly. Unchanged mediastinal contours. Persistent blunting of right costophrenic angle.  No pneumothorax. No acute osseous abnormalities are seen. IMPRESSION: 1. Improving opacity in the right upper lobe, mild residual streaky opacities may represent scarring. 2. Stable cardiomegaly. 3. Persistent blunting of right costophrenic angle, may represent scarring or small effusion. Electronically Signed   By: Narda Rutherford M.D.   On: 12/28/2022 17:51   DG Pelvis 1-2 Views  Result Date: 12/28/2022 CLINICAL DATA:  Status post fall onto right side with pain. EXAM: PELVIS - 1-2 VIEW; RIGHT FEMUR 2 VIEWS COMPARISON:  None Available. FINDINGS: Pelvis: Comminuted and displaced intertrochanteric right proximal femur fracture. There is involvement of the lesser and greater trochanters. Femoral head remains seated, no hip dislocation. Intact pubic rami. No pubic symphyseal or sacroiliac diastasis. Femur: The distal femur is intact. No additional distal femur fracture. Knee alignment is maintained. IMPRESSION: Comminuted and displaced intertrochanteric right proximal femur fracture. Electronically Signed   By: Narda Rutherford M.D.   On: 12/28/2022 17:40   DG FEMUR, MIN 2 VIEWS RIGHT  Result Date: 12/28/2022 CLINICAL DATA:  Status post fall onto right side with pain. EXAM: PELVIS - 1-2 VIEW;  RIGHT FEMUR 2 VIEWS COMPARISON:  None Available. FINDINGS: Pelvis: Comminuted and displaced intertrochanteric right proximal femur fracture. There is involvement of the lesser and greater trochanters. Femoral head remains seated, no hip dislocation. Intact pubic rami. No pubic symphyseal or sacroiliac diastasis. Femur: The distal femur is intact. No additional distal femur fracture. Knee alignment is maintained. IMPRESSION: Comminuted and displaced intertrochanteric right proximal femur fracture. Electronically Signed   By: Narda Rutherford M.D.   On: 12/28/2022 17:40     Data Reviewed: Relevant notes from primary care and specialist visits, past discharge summaries as available in EHR, including Care Everywhere. Prior diagnostic testing as pertinent to current admission diagnoses Updated medications and problem lists for reconciliation ED course, including vitals, labs, imaging, treatment and response to treatment Triage notes, nursing and pharmacy notes and ED provider's notes Notable results as noted in HPI  Assessment and Plan: * Hip fracture, right, closed, initial encounter [facility]) Patient has a moderate to surgical risk due to her pulmonary and cardiac disease.  Chest xray is stable with what sounds like RUL PNA from aspiration.  Will get cardiology - Dr.Khan / Pulmonary Dr Aundria Rud  consult for surgical clearance.  Aspiration/ fall precaution. Skin and wound care.       COPD (chronic obstructive pulmonary disease) (HCC) Cont with PRN albuterol and trelegy ellipta.   IS .   Coronary artery disease Currently patient will be n.p.o. and will resume metoprolol and Imdur postop.  Hypertension Vitals:   12/28/22 1509 12/28/22 1530 12/28/22 1730 12/28/22 1830  BP: (!) 198/52 (!) 198/117 (!) 161/54 (!) 173/42   12/28/22 1900 12/28/22 1930 12/28/22 2018 12/28/22 2020  BP: (!) 186/43 (!) 188/43 (!) 199/51 (!) 187/49   12/29/22 0226  BP: (!) 154/36  Prn hydralazine 10 mg q6 at 150 and  above systolic as home bp meds are held .    DVT prophylaxis:  Held   Consults:  Orthopedic: Dr.Cairns.   Advance Care Planning:    Code Status: Full Code   Family Communication:  None   Disposition Plan:  TBD>  Severity of Illness: The appropriate patient status for this patient is INPATIENT. Inpatient status is judged to be reasonable and necessary in order to provide the required intensity of service to ensure the patient's safety. The patient's presenting symptoms, physical exam findings, and initial radiographic and laboratory data in the context of  their chronic comorbidities is felt to place them at high risk for further clinical deterioration. Furthermore, it is not anticipated that the patient will be medically stable for discharge from the hospital within 2 midnights of admission.   * I certify that at the point of admission it is my clinical judgment that the patient will require inpatient hospital care spanning beyond 2 midnights from the point of admission due to high intensity of service, high risk for further deterioration and high frequency of surveillance required.*  Author: Gertha Calkin, MD 12/29/2022 3:17 AM  For on call review [email or web address].

## 2022-12-28 NOTE — ED Notes (Signed)
Informed RN Mckenzie via chat/ pt has bed assigned

## 2022-12-28 NOTE — Progress Notes (Addendum)
[facility] Liaison note:   This patient is a current [facility] patient. [facility] will continue to follow for discharge disposition.    Please call for any Hospice  related questions or concerns.   [facility] Liaison [phone]

## 2022-12-29 ENCOUNTER — Encounter: Payer: Self-pay | Admitting: Internal Medicine

## 2022-12-29 ENCOUNTER — Inpatient Hospital Stay: Admitting: Certified Registered"

## 2022-12-29 ENCOUNTER — Encounter
Admission: EM | Disposition: A | Discharge status: Hospice | Payer: Self-pay | Source: Home / Self Care | Attending: Internal Medicine

## 2022-12-29 ENCOUNTER — Other Ambulatory Visit: Payer: Self-pay

## 2022-12-29 ENCOUNTER — Inpatient Hospital Stay

## 2022-12-29 DIAGNOSIS — I2510 Atherosclerotic heart disease of native coronary artery without angina pectoris: Secondary | ICD-10-CM

## 2022-12-29 DIAGNOSIS — I159 Secondary hypertension, unspecified: Secondary | ICD-10-CM | POA: Diagnosis not present

## 2022-12-29 DIAGNOSIS — J449 Chronic obstructive pulmonary disease, unspecified: Secondary | ICD-10-CM | POA: Diagnosis not present

## 2022-12-29 DIAGNOSIS — S72141A Displaced intertrochanteric fracture of right femur, initial encounter for closed fracture: Secondary | ICD-10-CM | POA: Diagnosis not present

## 2022-12-29 HISTORY — PX: INTRAMEDULLARY (IM) NAIL INTERTROCHANTERIC: SHX5875

## 2022-12-29 LAB — BASIC METABOLIC PANEL
Anion gap: 8 (ref 5–15)
BUN: 18 mg/dL (ref 8–23)
CO2: 31 mmol/L (ref 22–32)
Calcium: 8.8 mg/dL — ABNORMAL LOW (ref 8.9–10.3)
Chloride: 100 mmol/L (ref 98–111)
Creatinine, Ser: 0.92 mg/dL (ref 0.44–1.00)
GFR, Estimated: >60 mL/min (ref >60)
Glucose, Bld: 95 mg/dL (ref 70–99)
Potassium: 4 mmol/L (ref 3.5–5.1)
Sodium: 139 mmol/L (ref 135–145)

## 2022-12-29 LAB — CBC
HCT: 31 % — ABNORMAL LOW (ref 36.0–46.0)
Hemoglobin: 10.1 g/dL — ABNORMAL LOW (ref 12.0–15.0)
MCH: 30.4 pg (ref 26.0–34.0)
MCHC: 32.6 g/dL (ref 30.0–36.0)
MCV: 93.4 fL (ref 80.0–100.0)
Platelets: 175 10*3/uL (ref 150–400)
RBC: 3.32 MIL/uL — ABNORMAL LOW (ref 3.87–5.11)
RDW: 14 % (ref 11.5–15.5)
WBC: 6.6 10*3/uL (ref 4.0–10.5)
nRBC: 0 % (ref 0.0–0.2)

## 2022-12-29 SURGERY — FIXATION, FRACTURE, INTERTROCHANTERIC, WITH INTRAMEDULLARY ROD
Anesthesia: General | Laterality: Right

## 2022-12-29 MED ORDER — CEFAZOLIN SODIUM-DEXTROSE 2-4 GM/100ML-% IV SOLN
INTRAVENOUS | Status: AC
  Filled 2022-12-29: qty 100

## 2022-12-29 MED ORDER — ENSURE ENLIVE PO LIQD
237.0000 mL | Freq: Two times a day (BID) | ORAL | Status: DC
  Administered 2022-12-30 (×2): 237 mL via ORAL
  Filled 2022-12-29: qty 237

## 2022-12-29 MED ORDER — ADULT MULTIVITAMIN W/MINERALS CH
1.0000 | ORAL_TABLET | Freq: Every day | ORAL | Status: DC
  Administered 2022-12-30: 1 via ORAL
  Filled 2022-12-29: qty 1

## 2022-12-29 MED ORDER — BUPIVACAINE HCL (PF) 0.5 % IJ SOLN
INTRAMUSCULAR | Status: DC | PRN
  Administered 2022-12-29: 30 mL

## 2022-12-29 MED ORDER — PHENYLEPHRINE 80 MCG/ML (10ML) SYRINGE FOR IV PUSH (FOR BLOOD PRESSURE SUPPORT)
PREFILLED_SYRINGE | INTRAVENOUS | Status: DC | PRN
  Administered 2022-12-29: 120 ug via INTRAVENOUS

## 2022-12-29 MED ORDER — ONDANSETRON HCL 4 MG/2ML IJ SOLN
INTRAMUSCULAR | Status: DC | PRN
  Administered 2022-12-29: 4 mg via INTRAVENOUS

## 2022-12-29 MED ORDER — PHENYLEPHRINE HCL-NACL 20-0.9 MG/250ML-% IV SOLN
INTRAVENOUS | Status: DC | PRN
  Administered 2022-12-29: 40 ug/min via INTRAVENOUS

## 2022-12-29 MED ORDER — PROPOFOL 500 MG/50ML IV EMUL
INTRAVENOUS | Status: DC | PRN
  Administered 2022-12-29: 40 ug/kg/min via INTRAVENOUS

## 2022-12-29 MED ORDER — HYDRALAZINE HCL 20 MG/ML IJ SOLN
10.0000 mg | Freq: Four times a day (QID) | INTRAMUSCULAR | Status: AC | PRN
  Administered 2022-12-29: 10 mg via INTRAVENOUS
  Filled 2022-12-29: qty 1

## 2022-12-29 MED ORDER — ACETAMINOPHEN 10 MG/ML IV SOLN
INTRAVENOUS | Status: AC
  Filled 2022-12-29: qty 100

## 2022-12-29 MED ORDER — FENTANYL CITRATE (PF) 100 MCG/2ML IJ SOLN: 25.0000 ug | INTRAMUSCULAR | Status: DC | PRN

## 2022-12-29 MED ORDER — CHLORHEXIDINE GLUCONATE 4 % EX SOLN: Freq: Once | CUTANEOUS | Status: AC

## 2022-12-29 MED ORDER — KETAMINE HCL 10 MG/ML IJ SOLN
INTRAMUSCULAR | Status: DC | PRN
Start: 2022-12-29 — End: 2022-12-29
  Administered 2022-12-29: 20 mg via INTRAVENOUS

## 2022-12-29 MED ORDER — PROPOFOL 10 MG/ML IV BOLUS
INTRAVENOUS | Status: DC | PRN
  Administered 2022-12-29: 30 mg via INTRAVENOUS

## 2022-12-29 MED ORDER — KETAMINE HCL 50 MG/5ML IJ SOSY
PREFILLED_SYRINGE | INTRAMUSCULAR | Status: AC
  Filled 2022-12-29: qty 5

## 2022-12-29 MED ORDER — PHENYLEPHRINE HCL-NACL 20-0.9 MG/250ML-% IV SOLN
INTRAVENOUS | Status: AC
  Filled 2022-12-29: qty 250

## 2022-12-29 MED ORDER — PROPOFOL 1000 MG/100ML IV EMUL
INTRAVENOUS | Status: AC
  Filled 2022-12-29: qty 100

## 2022-12-29 MED ORDER — BUPIVACAINE HCL (PF) 0.5 % IJ SOLN
INTRAMUSCULAR | Status: DC | PRN
  Administered 2022-12-29: 2.5 mL

## 2022-12-29 MED ORDER — OXYCODONE HCL 5 MG PO TABS: 5.0000 mg | ORAL_TABLET | Freq: Once | ORAL | Status: DC | PRN

## 2022-12-29 MED ORDER — DEXAMETHASONE SODIUM PHOSPHATE 10 MG/ML IJ SOLN
INTRAMUSCULAR | Status: AC
  Filled 2022-12-29: qty 1

## 2022-12-29 MED ORDER — SODIUM CHLORIDE 0.9 % IR SOLN
Status: DC | PRN
  Administered 2022-12-29: 500 mL

## 2022-12-29 MED ORDER — LACTATED RINGERS IV SOLN: INTRAVENOUS | Status: DC | PRN

## 2022-12-29 MED ORDER — ACETAMINOPHEN 10 MG/ML IV SOLN
INTRAVENOUS | Status: DC | PRN
  Administered 2022-12-29: 1000 mg via INTRAVENOUS

## 2022-12-29 MED ORDER — DEXAMETHASONE SODIUM PHOSPHATE 10 MG/ML IJ SOLN
INTRAMUSCULAR | Status: DC | PRN
  Administered 2022-12-29: 10 mg via INTRAVENOUS

## 2022-12-29 MED ORDER — CEFAZOLIN SODIUM-DEXTROSE 2-4 GM/100ML-% IV SOLN
2.0000 g | Freq: Three times a day (TID) | INTRAVENOUS | Status: AC
  Administered 2022-12-29 – 2022-12-30 (×3): 2 g via INTRAVENOUS
  Filled 2022-12-29 (×3): qty 100

## 2022-12-29 MED ORDER — BUPIVACAINE HCL (PF) 0.25 % IJ SOLN
INTRAMUSCULAR | Status: AC
  Filled 2022-12-29: qty 30

## 2022-12-29 MED ORDER — ONDANSETRON HCL 4 MG/2ML IJ SOLN
INTRAMUSCULAR | Status: AC
  Filled 2022-12-29: qty 2

## 2022-12-29 MED ORDER — OXYCODONE HCL 5 MG/5ML PO SOLN
5.0000 mg | Freq: Once | ORAL | Status: DC | PRN
Start: 2022-12-29 — End: 2022-12-29

## 2022-12-29 MED ORDER — TRANEXAMIC ACID-NACL 1000-0.7 MG/100ML-% IV SOLN
INTRAVENOUS | Status: AC
  Filled 2022-12-29: qty 100

## 2022-12-29 SURGICAL SUPPLY — 47 items
APL PRP STRL LF DISP 70% ISPRP (MISCELLANEOUS) ×1
BIT DRILL 4.0X280 (BIT) IMPLANT
BLADE SURG SZ10 CARB STEEL (BLADE) ×1 IMPLANT
CHLORAPREP W/TINT 26 (MISCELLANEOUS) ×1 IMPLANT
DRAPE STERI IOBAN 125X83 (DRAPES) ×1 IMPLANT
DRSG MEPILEX SACRM 8.7X9.8 (GAUZE/BANDAGES/DRESSINGS) ×1 IMPLANT
DRSG TEGADERM 4X4.75 (GAUZE/BANDAGES/DRESSINGS) ×4 IMPLANT
ELECT REM PT RETURN 9FT ADLT (ELECTROSURGICAL) ×1
ELECTRODE REM PT RTRN 9FT ADLT (ELECTROSURGICAL) ×1 IMPLANT
GAUZE SPONGE 4X4 12PLY STRL (GAUZE/BANDAGES/DRESSINGS) ×1 IMPLANT
GLOVE BIO SURGEON STRL SZ8 (GLOVE) ×2 IMPLANT
GLOVE BIOGEL PI IND STRL 7.0 (GLOVE) ×2 IMPLANT
GLOVE SRG 8 PF TXTR STRL LF DI (GLOVE) ×1 IMPLANT
GLOVE SURG UNDER POLY LF SZ8 (GLOVE) ×1
GOWN STRL REUS W/ TWL XL LVL3 (GOWN DISPOSABLE) ×1 IMPLANT
GOWN STRL REUS W/TWL LRG LVL3 (GOWN DISPOSABLE) ×1 IMPLANT
GOWN STRL REUS W/TWL XL LVL3 (GOWN DISPOSABLE) ×1
GUIDEROD T2 3X1000 (ROD) IMPLANT
GUIDEWIRE BALL NOSE 3.0X900 (WIRE) ×1
GUIDEWIRE ORTH 900X3XBALL NOSE (WIRE) IMPLANT
K-WIRE 3.2X450M STR (WIRE)
KIT TURNOVER CYSTO (KITS) ×1 IMPLANT
KWIRE 3.2X450M STR (WIRE) IMPLANT
MANIFOLD NEPTUNE II (INSTRUMENTS) ×1 IMPLANT
MARKER SKIN DUAL TIP RULER LAB (MISCELLANEOUS) ×1 IMPLANT
NAIL RIGHT 10X33X125 TROCHANTE (Plate) IMPLANT
NDL HYPO 21X1.5 SAFETY (NEEDLE) ×1 IMPLANT
NEEDLE HYPO 21X1.5 SAFETY (NEEDLE) ×1 IMPLANT
NS IRRIG 1000ML POUR BTL (IV SOLUTION) ×1 IMPLANT
PACK BASIC III (CUSTOM PROCEDURE TRAY) ×1
PACK SRG BSC III STRL LF ECLPS (CUSTOM PROCEDURE TRAY) ×1 IMPLANT
PAD ARMBOARD 7.5X6 YLW CONV (MISCELLANEOUS) ×1 IMPLANT
PENCIL SMOKE EVACUATOR COATED (MISCELLANEOUS) ×1 IMPLANT
PIN GUIDE THRD AR 3.2X330 (PIN) IMPLANT
POSITIONER HEAD 8X9X4 ADT (SOFTGOODS) ×1 IMPLANT
SCREW CORT CAPT FT 5.0X38 (Screw) IMPLANT
SCREW TELESCOP LAG 10.5X85 (Screw) IMPLANT
SPONGE T-LAP 18X18 ~~LOC~~+RFID (SPONGE) ×2 IMPLANT
STRIP CLOSURE SKIN 1/2X4 (GAUZE/BANDAGES/DRESSINGS) IMPLANT
SUT MNCRL AB 4-0 PS2 18 (SUTURE) IMPLANT
SUT MON AB 2-0 CT1 36 (SUTURE) ×1 IMPLANT
SUT VIC AB 0 CT1 27 (SUTURE) ×1
SUT VIC AB 0 CT1 27XBRD ANTBC (SUTURE) ×1 IMPLANT
SYR 30ML LL (SYRINGE) ×1 IMPLANT
SYR BULB IRRIG 60ML STRL (SYRINGE) ×2 IMPLANT
TOOL ACTIVATION (INSTRUMENTS) IMPLANT
TRAY FOLEY MTR SLVR 16FR STAT (SET/KITS/TRAYS/PACK) ×1 IMPLANT

## 2022-12-29 NOTE — Assessment & Plan Note (Signed)
Currently patient will be n.p.o. and will resume metoprolol and Imdur postop.

## 2022-12-29 NOTE — Assessment & Plan Note (Signed)
Patient has a moderate to surgical risk due to her pulmonary and cardiac disease.  Chest xray is stable with what sounds like RUL PNA from aspiration.  Will get cardiology - Dr.Khan / Pulmonary Dr Aundria Rud  consult for surgical clearance.  Aspiration/ fall precaution. Skin and wound care.

## 2022-12-29 NOTE — Transfer of Care (Signed)
Immediate Anesthesia Transfer of Care Note  Patient: Kristi Clark  Procedure(s) Performed: INTRAMEDULLARY (IM) NAIL INTERTROCHANTERIC (Right)  Patient Location: PACU  Anesthesia Type:General and Spinal  Level of Consciousness: awake, alert , and oriented  Airway & Oxygen Therapy: Patient Spontanous Breathing and Patient connected to nasal cannula oxygen  Post-op Assessment: Report given to RN and Post -op Vital signs reviewed and stable  Post vital signs: Reviewed and stable  Last Vitals:  Vitals Value Taken Time  BP 143/126 12/29/22 1150  Temp    Pulse 63 12/29/22 1152  Resp 18 12/29/22 1152  SpO2 100 % 12/29/22 1152  Vitals shown include unfiled device data.  Last Pain:  Vitals:   12/29/22 0831  TempSrc:   PainSc: 2       Patients Stated Pain Goal: 0 (12/28/22 2043)  Complications: No notable events documented.

## 2022-12-29 NOTE — Op Note (Signed)
Orthopaedic Surgery Operative Note (CSN: [id number])  Kristi Clark  08/20/1950 Date of Surgery: 12/28/2022 - 12/29/2022   Diagnoses:  Right intertrochanteric femur fracture  Procedure: Cephalomedullary nail for Right comminuted intertrochanteric femur fracture   Operative Finding Successful completion of the planned procedure.  Size 11 x 330 mm x 125 degree CMN with a single distal interlock through the outrigger   Post-Op Diagnosis: Same Surgeons:Primary: Oliver Barre, MD Assistants: N/A Location: [facility] OR ROOM 08 Anesthesia: Sedation plus regional anesthesia Antibiotics: Ancef 2 g Tourniquet time: N/A Estimated Blood Loss: 150 cc Complications: None Specimens: None  Implants: Implant Name Type Inv. Item Serial No. Manufacturer Lot No. LRB No. Used Action  ES TROCH NAIL, RT X 33 X MM X 125    [employer] [id number] Right 1 Implanted  TELESCOPING LAG SCREW 10.5 X    [employer] [id number] Right 1 Implanted  SCREW CORT CAPT FT 5.0X38 - [id number] Screw SCREW CORT CAPT FT 5.0X38  [employer] [id number] Right 1 Implanted    Indications for Surgery:   Kristi Clark is a 72 y.o. female who had a mechanical fall and sustained a Right intertrochanteric femur fracture.  I recommended operative fixation to restore stability and allow the patient to ambulate immediately postop.  Benefits and risks of operative and nonoperative management were discussed prior to surgery with patient and her daughter and informed consent form was completed.  Specific risks including infection, need for additional surgery, persistent pain, bleeding, malunion, nonunion, blood clots and more severe complications associated with anesthesia.  The patient elected to proceed and surgical consent was obtained.    Procedure:   The patient was identified properly. Informed consent was obtained and the surgical site was marked. The patient was taken to the OR where a spinal epidural was completed.   Sedation was provided.    The patient was placed supine on a fracture table and appropriate reduction was obtained and visualized on fluoroscopy prior to the beginning of the procedure.  Timeout was performed before the beginning of the case.  Ancef 2 g dosing was confirmed prior to making incision.  The patient received TXA prior to the start of surgery.   We made an incision proximal to the greater trochanter and dissected down through the fascia.  We then carefully placed a guidepin, localizing under fluoroscopy.  Once satisfied with the starting point, the entry reamer was used to gain entry into the intramedullary canal.  A ball tip guidewire was then introduced and passed to an appropriate level at the physeal scar of the distal femur and measurement was obtained proximally using fluoroscopy.  We selected the appropriate length of nail, as noted above.  Entry reamer was used needed but the nail was unreamed.  At this point we placed our nail localizing under fluoroscopy, and confirmed that it was at the appropriate level.  Next we used the outrigger device to pass a wire into the femoral neck, and then the cephalomedullary lag screw.  The screw was locked proximally to avoid over collapse.  We then turned our attention to the distal interlocking screw.  Once again, we used the outrigger device to place a single interlocking screw in the midshaft area.  The outrigger device was removed and final fluoroscopic images were obtained.  The wounds were thoroughly irrigated closed in a multilayer fashion with 0 vicryl, 2-0 monocryl and 4-0 monocryl.  Sterile dressings were placed.  The patient was awoken from general anesthesia and  taken to the PACU in stable condition without complication.     Post-operative plan:  Weightbearing: The patient will be WBAT on the operative extremity.   DVT prophylaxis per primary team, no orthopedic contraindications.  Recommend 81 mg Aspirin BID, unless patient cannot  tolerate or was previously on anticoagulation.  Prefer to start Ppx POD#1 Pain control with PRN pain medication preferring oral medicines.   Dressing can be reinforced as needed, will change on POD#2/3 if needed.  Patient does not need to remain hospitalized for dressing change Follow up plan: approximately 2 weeks postop for incision check and XR.  If the patient will be returning to a nursing facility, sutures can be trimmed around this time and a follow up appointment can be scheduled for 6 weeks after surgery. XR at next visit:  please obtain AP pelvis, and 2 views of the Right femur

## 2022-12-29 NOTE — Assessment & Plan Note (Signed)
Vitals:   12/28/22 1509 12/28/22 1530 12/28/22 1730 12/28/22 1830  BP: (!) 198/52 (!) 198/117 (!) 161/54 (!) 173/42   12/28/22 1900 12/28/22 1930 12/28/22 2018 12/28/22 2020  BP: (!) 186/43 (!) 188/43 (!) 199/51 (!) 187/49   12/29/22 0226  BP: (!) 154/36  Prn hydralazine 10 mg q6 at 150 and above systolic as home bp meds are held .

## 2022-12-29 NOTE — Plan of Care (Signed)

## 2022-12-29 NOTE — Anesthesia Preprocedure Evaluation (Signed)
Anesthesia Evaluation  Patient identified by MRN, date of birth, ID band Patient awake    Reviewed: Allergy & Precautions, NPO status , Patient's Chart, lab work & pertinent test results, reviewed documented beta blocker date and time   History of Anesthesia Complications Negative for: history of anesthetic complications  Airway Mallampati: III  TM Distance: >3 FB Neck ROM: full    Dental  (+) Upper Dentures, Missing, Dental Advidsory Given   Pulmonary neg pulmonary ROS, neg sleep apnea, COPD,  COPD inhaler and oxygen dependent, Patient abstained from smoking., former smoker   Pulmonary exam normal        Cardiovascular hypertension, Pt. on medications and Pt. on home beta blockers (-) Past MI and (-) CHF negative cardio ROS Normal cardiovascular exam(-) dysrhythmias      Neuro/Psych neg Seizures negative neurological ROS  negative psych ROS   GI/Hepatic negative GI ROS, Neg liver ROS,GERD  Medicated,,  Endo/Other  negative endocrine ROSneg diabetes    Renal/GU      Musculoskeletal   Abdominal   Peds  Hematology negative hematology ROS (+) Blood dyscrasia, anemia   Anesthesia Other Findings Patient seen and evaluated at bedside. Patient's daughter was also present. She states the patient has been more forgetful the past few days and can't recall basic things. Patient is A&O to self and location but does not know what we are doing today. Consented the daughter for spinal anesthesia with back up GETA. Patient's baseline COPD is severe and she is on 3L of oxygen at home. Patient was seen at the beginning of this month by her pulmonologist for acute on chronic respiratory failure. Discussed that I wanted to avoid an ETT if we could but it would be the ultimate back up. Daughter stated she understood and agreed to proceed.   Past Medical History: 10/25/2017: AK (actinic keratosis)     Comment:  left forehead No date:  Arrhythmia     Comment:  atrial fibrillation 10/25/2017: BCC (basal cell carcinoma)     Comment:  left upper lateral eyelid, Moh's No date: CHF (congestive heart failure) (HCC) No date: COPD (chronic obstructive pulmonary disease) (HCC) No date: Coronary artery disease No date: Depression No date: Dysrhythmia No date: GERD (gastroesophageal reflux disease) No date: Hyperlipidemia No date: Hypertension No date: Menopause No date: Osteopenia 07/14/2014: Severe sepsis (HCC) No date: Tobacco abuse  Past Surgical History: No date: CARDIAC CATHETERIZATION 2014: CHOLECYSTECTOMY 01/16/2018: COLONOSCOPY WITH PROPOFOL; N/A     Comment:  Procedure: COLONOSCOPY WITH PROPOFOL;  Surgeon:               Christena Deem, MD;  Location: [facility] ENDOSCOPY;                Service: Endoscopy;  Laterality: N/A; No date: CORONARY ANGIOPLASTY 01/16/2018: ESOPHAGOGASTRODUODENOSCOPY (EGD) WITH PROPOFOL; N/A     Comment:  Procedure: ESOPHAGOGASTRODUODENOSCOPY (EGD) WITH               PROPOFOL;  Surgeon: Christena Deem, MD;  Location:               [facility] ENDOSCOPY;  Service: Endoscopy;  Laterality: N/A; No date: heart stent 09/17/2021: LEFT HEART CATH AND CORONARY ANGIOGRAPHY; N/A     Comment:  Procedure: LEFT HEART CATH AND CORONARY ANGIOGRAPHY;                Surgeon: Laurier Nancy, MD;  Location: [facility] INVASIVE CV  LAB;  Service: Cardiovascular;  Laterality: N/A; 05/11/2021: TEE WITHOUT CARDIOVERSION; N/A     Comment:  Procedure: TRANSESOPHAGEAL ECHOCARDIOGRAM (TEE);                Surgeon: Lamar Blinks, MD;  Location: [facility] ORS;                Service: Cardiovascular;  Laterality: N/A; No date: TOTAL ABDOMINAL HYSTERECTOMY  BMI    Body Mass Index: 22.22 kg/m      Reproductive/Obstetrics negative OB ROS                             Anesthesia Physical Anesthesia Plan  ASA: 3 and emergent  Anesthesia Plan: Spinal and General   Post-op Pain  Management:    Induction:   PONV Risk Score and Plan: 3 and Ondansetron, Dexamethasone, TIVA and Propofol infusion  Airway Management Planned: Natural Airway and Nasal Cannula  Additional Equipment:   Intra-op Plan:   Post-operative Plan:   Informed Consent: I have reviewed the patients History and Physical, chart, labs and discussed the procedure including the risks, benefits and alternatives for the proposed anesthesia with the patient or authorized representative who has indicated his/her understanding and acceptance.     Dental Advisory Given  Plan Discussed with: Anesthesiologist, CRNA and Surgeon  Anesthesia Plan Comments: (Patient reports no bleeding problems and no anticoagulant use.  Plan for spinal with backup GA  Patient consented for risks of anesthesia including but not limited to:  - adverse reactions to medications - damage to eyes, teeth, lips or other oral mucosa - nerve damage due to positioning  - risk of bleeding, infection and or nerve damage from spinal that could lead to paralysis - risk of headache or failed spinal - damage to teeth, lips or other oral mucosa - sore throat or hoarseness - damage to heart, brain, nerves, lungs, other parts of body or loss of life  Patient voiced understanding and assent.)        Anesthesia Quick Evaluation

## 2022-12-29 NOTE — Plan of Care (Signed)

## 2022-12-29 NOTE — Progress Notes (Signed)
      [facility] Patient Visit  [name] is a current hospice patient, with hospice diagnosos of COPD, who was admitted to [facility] on 12/28/22 with diagnosis of a hip fracture after a fall. [facility] was notified of admission on 10.23.24. Per Dr. Patric Dykes, hospice physician, this is a related hospital admission.   Attempted to visit with patient in hospital, however, patient was in the operating room at time of my visit. Attempted to contact daughter, [name] by phone and voicemail left.    Patient remains GIP appropriate due to need for stabilization of fracture, IV antibiotics, and IV pain medications.  Vital Signs: 98.4/68/18    146/32   99% on 4LPM [place]  I&O:  245/not documented  Abnormal labs: Ca 8.8, HGB 10.1   Diagnostics: Chest xray, CT head, and CT cervical spine, all without significant findings. Xray of Femur and Pelvis: IMPRESSION: Comminuted and displaced intertrochanteric right proximal femur fracture.  IV/PRN Meds: fentanyl 50 mcg IV x 1, Ancef 2G IV x 1, LR 58ml/hr, albuterol MDI x 1, hydralazine 10mg  IV x 1, Vicodin 5/325 po x 1, Morphine sulfate, 2mg  IV x 3  Problem List:  * Hip fracture, right, closed, initial encounter [facility]) Patient has a moderate to surgical risk due to her pulmonary and cardiac disease.  Chest xray is stable with what sounds like RUL PNA from aspiration.  Will get cardiology - Dr.Khan / Pulmonary Dr Aundria Rud  consult for surgical clearance.  Aspiration/ fall precaution. Skin and wound care.    COPD (chronic obstructive pulmonary disease) (HCC) Cont with PRN albuterol and trelegy ellipta.   IS .     Coronary artery disease Currently patient will be n.p.o. and will resume metoprolol and Imdur postop.   Hypertension                               Prn hydralazine 10 mg q6 at 150 and above systolic as home bp meds are held .  DVT prophylaxis:  Held        Discharge Planning: Ongoing  Family Contact: Attempted to communicate with daughter by phone.  IDT: Updated  Goals of Care: Full Code currently  Should patient need ambulance transfer at discharge- please use [facility]) as they contract this service for our active hospice patients.  Glenna Fellows BSN, RN, [facility] hospital liaison [phone]

## 2022-12-29 NOTE — Anesthesia Procedure Notes (Signed)
Spinal  Patient location during procedure: OR Start time: 12/29/2022 10:10 AM End time: 12/29/2022 10:16 AM Reason for block: surgical anesthesia Staffing Performed: resident/CRNA  Anesthesiologist: Stephanie Coup, MD Resident/CRNA: Katherine Basset, CRNA Performed by: Katherine Basset, CRNA Authorized by: Stephanie Coup, MD   Preanesthetic Checklist Completed: patient identified, IV checked, site marked, risks and benefits discussed, surgical consent, monitors and equipment checked, pre-op evaluation and timeout performed Spinal Block Patient position: right lateral decubitus Prep: ChloraPrep Patient monitoring: heart rate, continuous pulse ox and blood pressure Approach: midline Location: L3-4 Injection technique: single-shot Needle Needle type: Pencan  Needle gauge: 25 G Needle length: 9 cm Assessment Sensory level: T6 Events: CSF return

## 2022-12-29 NOTE — Anesthesia Postprocedure Evaluation (Signed)
Anesthesia Post Note  Patient: Kristi Clark  Procedure(s) Performed: INTRAMEDULLARY (IM) NAIL INTERTROCHANTERIC (Right)  Patient location during evaluation: PACU Anesthesia Type: Spinal Level of consciousness: awake and alert Pain management: pain level controlled Vital Signs Assessment: post-procedure vital signs reviewed and stable Respiratory status: spontaneous breathing, nonlabored ventilation, respiratory function stable and patient connected to nasal cannula oxygen Cardiovascular status: blood pressure returned to baseline and stable Postop Assessment: no apparent nausea or vomiting Anesthetic complications: no  No notable events documented.   Last Vitals:  Vitals:   12/29/22 1200 12/29/22 1215  BP: (!) 117/35 (!) 159/35  Pulse: 63 64  Resp: (!) 21 17  Temp:    SpO2: 100% 100%    Last Pain:  Vitals:   12/29/22 1215  TempSrc:   PainSc: 0-No pain    LLE Motor Response: Purposeful movement (12/29/22 1215) LLE Sensation: Decreased (12/29/22 1215) RLE Motor Response: Purposeful movement (12/29/22 1215) RLE Sensation: Decreased (12/29/22 1215)      Stephanie Coup

## 2022-12-29 NOTE — Progress Notes (Signed)
Initial Nutrition Assessment  DOCUMENTATION CODES:   Not applicable  INTERVENTION:   -Once diet is advanced, add:   -Ensure Enlive po BID, each supplement provides 350 kcal and 20 grams of protein.  -MVI with minerals daily  NUTRITION DIAGNOSIS:   Increased nutrient needs related to post-op healing as evidenced by estimated needs.  GOAL:   Patient will meet greater than or equal to 90% of their needs  MONITOR:   PO intake, Supplement acceptance, Diet advancement  REASON FOR ASSESSMENT:   Consult Hip fracture protocol  ASSESSMENT:   Pt with medical history significant for end-stage COPD, chronic hypoxic respiratory failure on 3 L, atrial fibrillation not on AC, CAD, HFpEF, hypertension, hyperlipidemia. Pt brought in due to fall at home while using her walker to get to the bathroom she fell on her right side and did not strike her head or lose consciousness.  Pt admitted with rt hip fracture.   Reviewed I/O's: +246 ml x 24 hours   Pt unavailable at time of visit. RD unable to obtain further nutrition-related history or complete nutrition-focused physical exam at this time.    Pt is followed by hospice.   Per orthopedics notes, plan for rt hip cephalopmedullary nailing today. Pt is NPO for procedure.   Reviewed wt hx; wt has been stable over the past 4 months.   Pt with increased nutritional needs for post-op healing. Noted pt with history of moderate malnutrition, which RD suspects is ongoing. Pt would benefit from addition of oral nutrition supplements.   Medications reviewed and include colace.   Labs reviewed.    Diet Order:   Diet Order             Diet NPO time specified  Diet effective midnight                   EDUCATION NEEDS:   No education needs have been identified at this time  Skin:  Skin Assessment: Reviewed RN Assessment  Last BM:  12/27/22  Height:   Ht Readings from Last 1 Encounters:  12/28/22 4\' 11"  (1.499 m)    Weight:    Wt Readings from Last 1 Encounters:  12/28/22 49.9 kg    Ideal Body Weight:  44.7 kg  BMI:  Body mass index is 22.22 kg/m.  Estimated Nutritional Needs:   Kcal:  1500-1700  Protein:  75-90 grams  Fluid:  > 1.5 L    Levada Schilling, RD, LDN, CDCES Registered Dietitian III Certified Diabetes Care and Education Specialist Please refer to [facility] for RD and/or RD on-call/weekend/after hours pager

## 2022-12-29 NOTE — Progress Notes (Addendum)
1      PROGRESS NOTE    Kristi Clark  OZH:[id number] DOB: Jul 30, 1950 DOA: 12/28/2022 PCP: Kristi Carrow, DO    Brief Narrative:   72 y.o. 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  Patient followed by hospice at home and still planning for same at discharge as per her daughter   Assessment & Plan:   Principal Problem:   Displaced intertrochanteric fracture of right femur, initial encounter for closed fracture (HCC) Active Problems:   COPD (chronic obstructive pulmonary disease) (HCC)   Hypertension   Coronary artery disease  Right intertrochanteric femur fracture Status post mechanical fall Status post cephalomedullary nail by Ortho  End-stage COPD CAD A-fib Hypertension Followed by hospice -family requesting to keep her comfortable and no aggressive management  DVT prophylaxis: SCDs SCDs Start: 12/28/22 1926     Code Status: DNR Family Communication: Updated patient's daughter Kristi Clark over phone Disposition Plan: Discharge back to home with hospice when clinically stable depending on postop recovery   Consultants:  Ortho  Procedures:  Status post cephalomedullary nail for right comminuted intertrochanteric femur fracture  Antimicrobials:  IV Ancef for surgical prophylaxis   Subjective: Patient seen and examined in PACU.  Feeling cold and sleepy  Objective: Vitals:   12/29/22 1200 12/29/22 1215 12/29/22 1230 12/29/22 1245  BP: (!) 117/35 (!) 159/35 (!) 147/39 (!) 150/30  Pulse: 63 64 62 67  Resp: (!) 21 17 (!) 21 19  Temp:    98 F (36.7 C)  TempSrc:      SpO2: 100% 100% 100% 100%   Weight:      Height:        Intake/Output Summary (Last 24 hours) at 12/29/2022 1350 Last data filed at 12/29/2022 1226 Gross per 24 hour  Intake 1045.82 ml  Output 775 ml  Net 270.82 ml   Filed Weights   12/28/22 1501  Weight: 49.9 kg    Examination:  General exam: Appears calm and comfortable  Respiratory system: Decreased breath sound the bases. Respiratory effort normal. Cardiovascular system: S1 & S2 heard, RRR. No JVD, murmurs, rubs, gallops or clicks. No pedal edema. Gastrointestinal system: Abdomen is nondistended, soft and nontender. No organomegaly or masses felt. Normal bowel sounds heard. Central nervous system: Alert and oriented. No focal neurological deficits. Extremities: Right lower extremity dressing in place at the surgical site. Skin: No rashes, lesions or ulcers Psychiatry: Judgement and insight appear normal. Mood & affect appropriate.     Data Reviewed: I have personally reviewed following labs and imaging studies  CBC: Recent Labs  Lab 12/28/22 1511 12/29/22 0706  WBC 5.6 6.6  NEUTROABS 2.7  --   HGB 11.0* 10.1*  HCT 35.7* 31.0*  MCV 98.3 93.4  PLT 209 175   Basic Metabolic Panel: Recent Labs  Lab 12/28/22 1511 12/29/22 0706  NA 138 139  K 4.0 4.0  CL 97* 100  CO2 33* 31  GLUCOSE 115* 95  BUN 16 18  CREATININE 0.86 0.92  CALCIUM 9.1 8.8*         Radiology Studies: DG Pelvis Portable  Result Date: 12/29/2022 CLINICAL DATA:  Closed intertrochanteric fracture right femur. Status post intramedullary nail. EXAM: PORTABLE PELVIS  1-2 VIEWS; RIGHT FEMUR PORTABLE 2 VIEW COMPARISON:  Pelvis and right femur radiographs 12/28/2022 FINDINGS: There is diffuse decreased bone mineralization. New right femoral long intramedullary nail fixation of the previously seen comminuted and displaced proximal femoral intertrochanteric fracture. There is improved, now anatomic alignment. No hardware complication is seen. Moderate patellofemoral joint  space narrowing. No knee joint effusion is seen. Expected postoperative subcutaneous air about the lateral right thigh. Mild right femoroacetabular joint space narrowing. Mild-to-moderate atherosclerotic calcifications. IMPRESSION: New long right femoral intramedullary nail fixation of the previously seen comminuted and displaced proximal femoral intertrochanteric fracture. Improved, now anatomic alignment. No hardware complication is seen. Electronically Signed   By: Neita Garnet M.D.   On: 12/29/2022 13:31   DG FEMUR PORT, MIN 2 VIEWS RIGHT  Result Date: 12/29/2022 CLINICAL DATA:  Closed intertrochanteric fracture right femur. Status post intramedullary nail. EXAM: PORTABLE PELVIS 1-2 VIEWS; RIGHT FEMUR PORTABLE 2 VIEW COMPARISON:  Pelvis and right femur radiographs 12/28/2022 FINDINGS: There is diffuse decreased bone mineralization. New right femoral long intramedullary nail fixation of the previously seen comminuted and displaced proximal femoral intertrochanteric fracture. There is improved, now anatomic alignment. No hardware complication is seen. Moderate patellofemoral joint space narrowing. No knee joint effusion is seen. Expected postoperative subcutaneous air about the lateral right thigh. Mild right femoroacetabular joint space narrowing. Mild-to-moderate atherosclerotic calcifications. IMPRESSION: New long right femoral intramedullary nail fixation of the previously seen comminuted and displaced proximal femoral intertrochanteric fracture. Improved, now anatomic alignment. No hardware complication is seen. Electronically Signed   By: Neita Garnet M.D.   On: 12/29/2022 13:31   DG HIP UNILAT WITH PELVIS 2-3 VIEWS RIGHT  Result Date: 12/29/2022 CLINICAL DATA:  Closed comminuted intertrochanteric fracture right femur. Right intramedullary nail. Intraoperative fluoroscopy. EXAM: DG HIP (WITH OR WITHOUT PELVIS) 2-3V RIGHT COMPARISON:  Right femoral radiographs 12/28/2022 FINDINGS: Images were  performed intraoperatively without the presence of a radiologist. The patient is undergoing placement of right femoral intramedullary nail fixating the previously seen intertrochanteric fracture of the proximal right femur. Improved, normal alignment. No hardware complication is seen. Total fluoroscopy images: 4 Total fluoroscopy time: 60 seconds Total dose: Radiation Exposure Index (as provided by the fluoroscopic device): 6.96 mGy air Kerma Please see intraoperative findings for further detail. IMPRESSION: Intraoperative fluoroscopy for right femoral intramedullary nail placement. Electronically Signed   By: Neita Garnet M.D.   On: 12/29/2022 13:30   DG C-Arm 1-60 Min-No Report  Result Date: 12/29/2022 Fluoroscopy was utilized by the requesting physician.  No radiographic interpretation.   DG C-Arm 1-60 Min-No Report  Result Date: 12/29/2022 Fluoroscopy was utilized by the requesting physician.  No radiographic interpretation.   CT Head Wo Contrast  Result Date: 12/28/2022 CLINICAL DATA:  Recent fall with headaches and neck pain, initial encounter EXAM: CT HEAD WITHOUT CONTRAST CT CERVICAL SPINE WITHOUT CONTRAST TECHNIQUE: Multidetector CT imaging of the head and cervical spine was performed following the standard protocol without intravenous contrast. Multiplanar CT image reconstructions of the cervical spine were also generated. RADIATION DOSE REDUCTION: This exam was performed according to the departmental dose-optimization program which includes automated exposure control, adjustment of the mA and/or kV according to patient size and/or use of iterative reconstruction technique. COMPARISON:  04/28/2021 FINDINGS: CT HEAD FINDINGS Brain: No evidence of acute infarction, hemorrhage, hydrocephalus, extra-axial collection or mass lesion/mass effect. Chronic white matter ischemic changes are again noted. Vascular: No hyperdense vessel or unexpected calcification. Skull: Normal. Negative for fracture  or focal lesion. Sinuses/Orbits: No acute finding.  Other: None. CT CERVICAL SPINE FINDINGS Alignment: Within normal limits. Skull base and vertebrae: 7 cervical segments are well visualized. Vertebral body height is well maintained. Facet hypertrophic changes are noted. No acute fracture or acute facet abnormality is noted. The odontoid is within normal limits. Soft tissues and spinal canal: Surrounding soft tissue structures are within normal limits. Upper chest: Visualized lung apices no significant emphysematous change. Biapical pleural and parenchymal scarring is noted stable from the prior exam. Other: None IMPRESSION: CT of the head: Chronic white matter ischemic changes without acute abnormality. CT of the cervical spine: Degenerative change without acute bony abnormality. Electronically Signed   By: Alcide Clever M.D.   On: 12/28/2022 18:55   CT Cervical Spine Wo Contrast  Result Date: 12/28/2022 CLINICAL DATA:  Recent fall with headaches and neck pain, initial encounter EXAM: CT HEAD WITHOUT CONTRAST CT CERVICAL SPINE WITHOUT CONTRAST TECHNIQUE: Multidetector CT imaging of the head and cervical spine was performed following the standard protocol without intravenous contrast. Multiplanar CT image reconstructions of the cervical spine were also generated. RADIATION DOSE REDUCTION: This exam was performed according to the departmental dose-optimization program which includes automated exposure control, adjustment of the mA and/or kV according to patient size and/or use of iterative reconstruction technique. COMPARISON:  04/28/2021 FINDINGS: CT HEAD FINDINGS Brain: No evidence of acute infarction, hemorrhage, hydrocephalus, extra-axial collection or mass lesion/mass effect. Chronic white matter ischemic changes are again noted. Vascular: No hyperdense vessel or unexpected calcification. Skull: Normal. Negative for fracture or focal lesion. Sinuses/Orbits: No acute finding. Other: None. CT CERVICAL SPINE  FINDINGS Alignment: Within normal limits. Skull base and vertebrae: 7 cervical segments are well visualized. Vertebral body height is well maintained. Facet hypertrophic changes are noted. No acute fracture or acute facet abnormality is noted. The odontoid is within normal limits. Soft tissues and spinal canal: Surrounding soft tissue structures are within normal limits. Upper chest: Visualized lung apices no significant emphysematous change. Biapical pleural and parenchymal scarring is noted stable from the prior exam. Other: None IMPRESSION: CT of the head: Chronic white matter ischemic changes without acute abnormality. CT of the cervical spine: Degenerative change without acute bony abnormality. Electronically Signed   By: Alcide Clever M.D.   On: 12/28/2022 18:55   DG Chest 1 View  Result Date: 12/28/2022 CLINICAL DATA:  Status post fall with right hip fracture EXAM: CHEST  1 VIEW COMPARISON:  12/02/2022 FINDINGS: Improving opacity in the right upper lobe, mild residual streaky opacities may represent scarring. Stable cardiomegaly. Unchanged mediastinal contours. Persistent blunting of right costophrenic angle. No pneumothorax. No acute osseous abnormalities are seen. IMPRESSION: 1. Improving opacity in the right upper lobe, mild residual streaky opacities may represent scarring. 2. Stable cardiomegaly. 3. Persistent blunting of right costophrenic angle, may represent scarring or small effusion. Electronically Signed   By: Narda Rutherford M.D.   On: 12/28/2022 17:51   DG Pelvis 1-2 Views  Result Date: 12/28/2022 CLINICAL DATA:  Status post fall onto right side with pain. EXAM: PELVIS - 1-2 VIEW; RIGHT FEMUR 2 VIEWS COMPARISON:  None Available. FINDINGS: Pelvis: Comminuted and displaced intertrochanteric right proximal femur fracture. There is involvement of the lesser and greater trochanters. Femoral head remains seated, no hip dislocation. Intact pubic rami. No pubic symphyseal or sacroiliac  diastasis. Femur: The distal femur is intact. No additional distal femur fracture. Knee alignment is maintained. IMPRESSION: Comminuted and displaced intertrochanteric right proximal femur fracture. Electronically Signed   By: Narda Rutherford M.D.   On:  12/28/2022 17:40   DG FEMUR, MIN 2 VIEWS RIGHT  Result Date: 12/28/2022 CLINICAL DATA:  Status post fall onto right side with pain. EXAM: PELVIS - 1-2 VIEW; RIGHT FEMUR 2 VIEWS COMPARISON:  None Available. FINDINGS: Pelvis: Comminuted and displaced intertrochanteric right proximal femur fracture. There is involvement of the lesser and greater trochanters. Femoral head remains seated, no hip dislocation. Intact pubic rami. No pubic symphyseal or sacroiliac diastasis. Femur: The distal femur is intact. No additional distal femur fracture. Knee alignment is maintained. IMPRESSION: Comminuted and displaced intertrochanteric right proximal femur fracture. Electronically Signed   By: Narda Rutherford M.D.   On: 12/28/2022 17:40        Scheduled Meds:  docusate sodium  100 mg Oral BID   [START ON 12/30/2022] feeding supplement  237 mL Oral BID BM   [START ON 12/30/2022] multivitamin with minerals  1 tablet Oral Daily   Continuous Infusions:   ceFAZolin (ANCEF) IV       LOS: 1 day    Time spent: 35-minute    Delfino Lovett, MD [facility] Pager [phone]  If 7PM-7AM, please contact night-coverage [email or web address] Password [email or web address] 12/29/2022, 1:50 PM

## 2022-12-29 NOTE — Assessment & Plan Note (Signed)
Cont with PRN albuterol and trelegy ellipta.   IS .

## 2022-12-30 ENCOUNTER — Encounter: Payer: Self-pay | Admitting: Orthopedic Surgery

## 2022-12-30 DIAGNOSIS — I10 Essential (primary) hypertension: Secondary | ICD-10-CM

## 2022-12-30 DIAGNOSIS — J449 Chronic obstructive pulmonary disease, unspecified: Secondary | ICD-10-CM | POA: Diagnosis not present

## 2022-12-30 DIAGNOSIS — E440 Moderate protein-calorie malnutrition: Secondary | ICD-10-CM | POA: Diagnosis not present

## 2022-12-30 DIAGNOSIS — S72141A Displaced intertrochanteric fracture of right femur, initial encounter for closed fracture: Secondary | ICD-10-CM | POA: Diagnosis not present

## 2022-12-30 LAB — CBC
HCT: 25.8 % — ABNORMAL LOW (ref 36.0–46.0)
Hemoglobin: 8.5 g/dL — ABNORMAL LOW (ref 12.0–15.0)
MCH: 30.7 pg (ref 26.0–34.0)
MCHC: 32.9 g/dL (ref 30.0–36.0)
MCV: 93.1 fL (ref 80.0–100.0)
Platelets: 173 10*3/uL (ref 150–400)
RBC: 2.77 MIL/uL — ABNORMAL LOW (ref 3.87–5.11)
RDW: 14 % (ref 11.5–15.5)
WBC: 7.9 10*3/uL (ref 4.0–10.5)
nRBC: 0 % (ref 0.0–0.2)

## 2022-12-30 LAB — BASIC METABOLIC PANEL
Anion gap: 7 (ref 5–15)
BUN: 18 mg/dL (ref 8–23)
CO2: 30 mmol/L (ref 22–32)
Calcium: 8.4 mg/dL — ABNORMAL LOW (ref 8.9–10.3)
Chloride: 98 mmol/L (ref 98–111)
Creatinine, Ser: 0.97 mg/dL (ref 0.44–1.00)
GFR, Estimated: >60 mL/min (ref >60)
Glucose, Bld: 134 mg/dL — ABNORMAL HIGH (ref 70–99)
Potassium: 4.1 mmol/L (ref 3.5–5.1)
Sodium: 135 mmol/L (ref 135–145)

## 2022-12-30 MED ORDER — METOPROLOL SUCCINATE ER 50 MG PO TB24: 50.0000 mg | ORAL_TABLET | Freq: Every day | ORAL | Status: DC

## 2022-12-30 MED ORDER — ISOSORBIDE MONONITRATE ER 30 MG PO TB24: 60.0000 mg | ORAL_TABLET | Freq: Two times a day (BID) | ORAL | Status: DC

## 2022-12-30 MED ORDER — METHOCARBAMOL 500 MG PO TABS: 500.0000 mg | ORAL_TABLET | Freq: Three times a day (TID) | ORAL | 0 refills | Status: DC | PRN

## 2022-12-30 MED ORDER — PANTOPRAZOLE SODIUM 40 MG PO TBEC
40.0000 mg | DELAYED_RELEASE_TABLET | Freq: Two times a day (BID) | ORAL | Status: DC
  Administered 2022-12-30: 40 mg via ORAL
  Filled 2022-12-30: qty 1

## 2022-12-30 MED ORDER — ALPRAZOLAM 0.25 MG PO TABS
0.2500 mg | ORAL_TABLET | Freq: Once | ORAL | Status: AC
  Administered 2022-12-30: 0.25 mg via ORAL
  Filled 2022-12-30: qty 1

## 2022-12-30 MED ORDER — GUAIFENESIN ER 600 MG PO TB12: 600.0000 mg | ORAL_TABLET | Freq: Two times a day (BID) | ORAL | Status: DC

## 2022-12-30 MED ORDER — MIRTAZAPINE 15 MG PO TABS: 45.0000 mg | ORAL_TABLET | Freq: Every day | ORAL | Status: DC

## 2022-12-30 MED ORDER — QUETIAPINE FUMARATE ER 50 MG PO TB24: 100.0000 mg | ORAL_TABLET | Freq: Every day | ORAL | Status: DC

## 2022-12-30 MED ORDER — FLUTICASONE FUROATE-VILANTEROL 100-25 MCG/ACT IN AEPB
1.0000 | INHALATION_SPRAY | Freq: Every day | RESPIRATORY_TRACT | Status: DC
  Filled 2022-12-30: qty 28

## 2022-12-30 MED ORDER — ASPIRIN 81 MG PO CHEW
81.0000 mg | CHEWABLE_TABLET | Freq: Two times a day (BID) | ORAL | Status: DC
  Administered 2022-12-30: 81 mg via ORAL
  Filled 2022-12-30: qty 1

## 2022-12-30 MED ORDER — ASPIRIN 81 MG PO CHEW: 81.0000 mg | CHEWABLE_TABLET | Freq: Two times a day (BID) | ORAL | 0 refills | Status: DC

## 2022-12-30 MED ORDER — PHENAZOPYRIDINE HCL 100 MG PO TABS: 100.0000 mg | ORAL_TABLET | Freq: Three times a day (TID) | ORAL | Status: DC

## 2022-12-30 MED ORDER — ENSURE ENLIVE PO LIQD: 237.0000 mL | Freq: Two times a day (BID) | ORAL | 12 refills | Status: DC

## 2022-12-30 MED ORDER — TAMSULOSIN HCL 0.4 MG PO CAPS: 0.4000 mg | ORAL_CAPSULE | Freq: Every day | ORAL | Status: DC

## 2022-12-30 MED ORDER — FLUTICASONE PROPIONATE 50 MCG/ACT NA SUSP
2.0000 | Freq: Every day | NASAL | Status: DC
  Filled 2022-12-30: qty 16

## 2022-12-30 MED ORDER — ALPRAZOLAM 0.25 MG PO TABS: 0.2500 mg | ORAL_TABLET | Freq: Two times a day (BID) | ORAL | Status: DC | PRN

## 2022-12-30 MED ORDER — UMECLIDINIUM BROMIDE 62.5 MCG/ACT IN AEPB
1.0000 | INHALATION_SPRAY | Freq: Every day | RESPIRATORY_TRACT | Status: DC
  Filled 2022-12-30: qty 7

## 2022-12-30 NOTE — Progress Notes (Signed)
Pt c/o anxiety and difficulty sleeping. Daughter at bedside and expressed concern about pt's medications. Provider on call was paged and orders placed for one time dose of Xanax. Will have day shift nurse remind attending to reorder home medications.

## 2022-12-30 NOTE — Evaluation (Signed)
Occupational Therapy Evaluation Patient Details Name: Kristi Clark MRN: [id number] DOB: [birth year] Today's Date: 12/30/2022   History of Present Illness Pt is a 72 yo female admitted status post fall with right intertrochanteric femur fracture with cephalomedullary nail placed on 10/24. PMH includes end-stage COPD, chronic hypoxic respiratory failure on 3L O2, CAP, atrial fibrillation not on AC, CAD, HFpEF, hypertension, and hyperlipidemia.   Clinical Impression   Pt seen for OT/PT evaluation this date, POD#1 from above surgery. Pt was labile throughout with daughter at bedside. Nurse in room at administer meds upon arrival to room. Pt has been using rollator at home; but activity tolerance has significantly decreased in the last couple weeks post previous hospital stay. Daughter states she is now requring physical assistance for all transfers prior to surgery, and MIN-MOD assistance with dressing and bathing.   Pt is eager to return to PLOF with less pain and improved safety. Pt currently requires MODA +2 for all bed mobility, in need of careful movements for pain control. Pt used MINA + 2 for STS with RW. Attempted to step pivot to recliner but pt limited by pain when initiating steps, further mobility deferred to later date. She states she will try again soon. Pt would benefit from skilled OT services to address noted impairments and functional limitations (see below for any additional details) in order to maximize safety and independence while minimizing falls risk and caregiver burden. OT will follow acutely.     If plan is discharge home, recommend the following: Help with stairs or ramp for entrance;Assist for transportation;Assistance with cooking/housework;A lot of help with walking and/or transfers;A lot of help with bathing/dressing/bathroom;Supervision due to cognitive status    Functional Status Assessment  Patient has had a recent decline in their functional status and  demonstrates the ability to make significant improvements in function in a reasonable and predictable amount of time.  Equipment Recommendations  None recommended by OT (will continue to assess DME needs)    Recommendations for Other Services       Precautions / Restrictions Precautions Precautions: Fall Restrictions Weight Bearing Restrictions: Yes RLE Weight Bearing: Weight bearing as tolerated      Mobility Bed Mobility Overal bed mobility: Needs Assistance Bed Mobility: Supine to Sit, Sit to Supine     Supine to sit: Mod assist, +2 for physical assistance Sit to supine: Mod assist, +2 for physical assistance   General bed mobility comments: modAx2 extremely slow for mobility of RLE and at trunk    Transfers Overall transfer level: Needs assistance Equipment used: Rolling walker (2 wheels) Transfers: Sit to/from Stand Sit to Stand: Min assist, +2 physical assistance           General transfer comment: pt attempted to take step pivot transfer to recliner but limited by R hip pain; returned to bed, pt tearful and crying out      Balance Overall balance assessment: Needs assistance Sitting-balance support: Bilateral upper extremity supported, Feet supported Sitting balance-Leahy Scale: Poor Sitting balance - Comments: minA for static and dynamic sitting balance Postural control: Posterior lean Standing balance support: Bilateral upper extremity supported, Reliant on assistive device for balance Standing balance-Leahy Scale: Poor Standing balance comment: significant posterior lean through heels; unable to weightshift forward with verbal cues; required minAx2 and RW to stand ~68min                           ADL either performed or assessed with  clinical judgement   ADL Overall ADL's : Needs assistance/impaired     Grooming: Wash/dry face;Sitting  supervision  Grooming Details (indicate cue type and reason): EOB        LB dressing: MAX A                               Vision Baseline Vision/History: 1 Wears glasses Patient Visual Report: No change from baseline       Perception         Praxis         Pertinent Vitals/Pain Pain Assessment Pain Score: 2  Pain Location: R hip at rest pre mobility Pain Descriptors / Indicators: Grimacing, Discomfort, Crying, Operative site guarding, Aching Pain Intervention(s): Limited activity within patient's tolerance, Monitored during session, Repositioned     Extremity/Trunk Assessment Upper Extremity Assessment Upper Extremity Assessment: Generalized weakness RUE deficits/detail RUE Deficits / Details: Pain wtih the IV on this date,   Lower Extremity Assessment Lower Extremity Assessment: Defer to PT evaluation  Cervical / Trunk Assessment Cervical / Trunk Assessment: Kyphotic   Communication Communication Communication: No apparent difficulties Cueing Techniques: Verbal cues;Tactile cues;Visual cues   Cognition Arousal: Alert Behavior During Therapy: Lability Overall Cognitive Status: Impaired/Different from baseline Area of Impairment: Awareness, Problem solving, Safety/judgement                         Safety/Judgement: Decreased awareness of safety Awareness: Intellectual Problem Solving: Requires verbal cues, Requires tactile cues, Difficulty sequencing General Comments: Pt A,Ox4, but daughter reporting she is different from baseline     General Comments  3.5L via [place], 90s throughout session    Exercises Other Exercises Other Exercises: Edu: Breathing techniques when feeling SOB, energy conservation tactics   Shoulder Instructions      Home Living Family/patient expects to be discharged to:: Private residence Living Arrangements: Children Available Help at Discharge: Family;Available 24 hours/day Type of Home: House Home Access: Stairs to enter [employer] of Steps: 4, daughters have been providing handheld assist Entrance  Stairs-Rails: None Home Layout: One level;Other (Comment)     Bathroom Shower/Tub: Tub/shower unit;Sponge bathes at baseline   [employer]: Standard     Home Equipment: Rollator (4 wheels);BSC/3in1   Additional Comments: Pt lives with her daughter and other family members. mostly sponge bathes, per daughter she occassionally takes a shower seated on [facility] with assistance from daughter      Prior Functioning/Environment Prior Level of Function : Needs assist  Cognitive Assist : Mobility (cognitive) Mobility (Cognitive): Intermittent cues   Physical Assist : Mobility (physical);ADLs (physical) Mobility (physical): Gait;Stairs;Transfers ADLs (physical): Bathing;IADLs Mobility Comments: has been using rollator at home; but activity tolerance has significantly decreased in the last couple weeks and is now requring physical assistance for all transfers ADLs Comments: has assist from daughter for bathing in bathtub, up until a few weeks ago was IND with dressing/toileting        OT Problem List: Decreased strength;Impaired balance (sitting and/or standing);Pain;Cardiopulmonary status limiting activity;Decreased activity tolerance;Decreased knowledge of use of DME or AE;Decreased knowledge of precautions      OT Treatment/Interventions: Self-care/ADL training;Therapeutic exercise;Patient/family education;Energy conservation;Therapeutic activities;DME and/or AE instruction    OT Goals(Current goals can be found in the care plan section) Acute Rehab OT Goals Patient Stated Goal: to return to PLOF OT Goal Formulation: With patient/family Time For Goal Achievement: 01/14/23 Potential to Achieve Goals: Good ADL Goals  Pt Will Perform Grooming: with min assist;standing Pt Will Perform Lower Body Dressing: with min assist;sitting/lateral leans Pt Will Transfer to Toilet: with min assist;ambulating Pt Will Perform Toileting - Clothing Manipulation and hygiene: sitting/lateral leans;with  contact guard assist  OT Frequency: Min 1X/week    Co-evaluation PT/OT/SLP Co-Evaluation/Treatment: Yes Reason for Co-Treatment: To address functional/ADL transfers;For patient/therapist safety PT goals addressed during session: Mobility/safety with mobility;Balance;Proper use of DME;Strengthening/ROM OT goals addressed during session: ADL's and self-care;Proper use of Adaptive equipment and DME      AM-PAC OT "6 Clicks" Daily Activity     Outcome Measure Help from another person eating meals?: None Help from another person taking care of personal grooming?: A Little Help from another person toileting, which includes using toliet, bedpan, or urinal?: A Lot Help from another person bathing (including washing, rinsing, drying)?: A Lot Help from another person to put on and taking off regular upper body clothing?: A Little Help from another person to put on and taking off regular lower body clothing?: A Lot 6 Click Score: 16   End of Session Equipment Utilized During Treatment: Rolling walker (2 wheels);Oxygen Nurse Communication: Mobility status  Activity Tolerance: Patient tolerated treatment well Patient left: in chair;with call bell/phone within reach  OT Visit Diagnosis: Other abnormalities of gait and mobility (R26.89);Muscle weakness (generalized) (M62.81);History of falling (Z91.81)                Time: 4403-4742 OT Time Calculation (min): 31 min Charges:  OT General Charges $OT Visit: 1 Visit OT Evaluation $OT Eval Moderate Complexity: 1 Mod  [employer], OTS

## 2022-12-30 NOTE — Evaluation (Addendum)
Physical Therapy Evaluation Patient Details Name: Kristi Clark MRN: [id number] DOB: 04-18-1950 Today's Date: 12/30/2022  History of Present Illness  Pt is a 72 yo female admitted status post fall with right intertrochanteric femur fracture with cephalomedullary nail placed on 10/24. PMH includes end-stage COPD, chronic hypoxic respiratory failure on 3L O2, CAP, atrial fibrillation not on AC, CAD, HFpEF, hypertension, and hyperlipidemia.  Clinical Impression  Pt alert and oriented x 4, presenting with emotional lability t/o session, but daughter reports her cognition is different from baseline. Pt is currently living at home with her daughter and has hospice care, but not rehab. Her home has 4 STE without handrails and has been requiring HHA to navigate these. Family reports pt was mod I with a rollator for all mobility and mod I for dressing/toileting, with assist for bathing, up until a couple weeks ago. She is now requiring much more assistance and displaying decreased activity tolerance, per family report. Upon entry, pt reports 2/10 R hip pain at rest, but is tearful/vocally cries out d/t pain with all mobility. Pt requires modAx2 for bed mobility and minAx2 to stand from EOB with RW. Pt left in bed with all needs met and call bell in reach. Functional impairments include RLE weakness and pain, balance deficits, and cardiopulmonary fatigue. Pt would benefit from skilled therapy to address these impairments and decrease caregiver burden.       If plan is discharge home, recommend the following: Two people to help with walking and/or transfers;Two people to help with bathing/dressing/bathroom;Assistance with feeding;Assistance with cooking/housework;Direct supervision/assist for medications management;Help with stairs or ramp for entrance;Assist for transportation   Can travel by private vehicle   No    Equipment Recommendations Wheelchair (measurements PT);Wheelchair cushion (measurements  PT);Hospital bed;Hoyer lift  Recommendations for Other Services       Functional Status Assessment Patient has had a recent decline in their functional status and demonstrates the ability to make significant improvements in function in a reasonable and predictable amount of time.     Precautions / Restrictions Precautions Precautions: Fall Restrictions Weight Bearing Restrictions: Yes RLE Weight Bearing: Weight bearing as tolerated      Mobility  Bed Mobility Overal bed mobility: Needs Assistance Bed Mobility: Supine to Sit, Sit to Supine     Supine to sit: Mod assist, +2 for physical assistance Sit to supine: Mod assist, +2 for physical assistance   General bed mobility comments: modAx2 to carefully move RLE and at trunk    Transfers Overall transfer level: Needs assistance Equipment used: Rolling walker (2 wheels) Transfers: Sit to/from Stand Sit to Stand: Min assist, +2 physical assistance           General transfer comment: pt attempted to take perform step pivot transfer to recliner but limited by R hip pain; pt tearful and crying out    Ambulation/Gait               General Gait Details: deferred 2/2 pain  Stairs            Wheelchair Mobility     Tilt Bed    Modified Rankin (Stroke Patients Only)       Balance Overall balance assessment: Needs assistance Sitting-balance support: Bilateral upper extremity supported, Feet supported Sitting balance-Leahy Scale: Poor Sitting balance - Comments: minA for static and dynamic sitting balance Postural control: Posterior lean Standing balance support: Bilateral upper extremity supported, Reliant on assistive device for balance Standing balance-Leahy Scale: Poor Standing balance comment: significant posterior  lean through heels; unable to weightshift forward with verbal cues; required minAx2 and RW to stand ~43min                             Pertinent Vitals/Pain Pain  Assessment Pain Assessment: 0-10 Pain Score: 2  Pain Location: R hip at rest; increased with all mobility Pain Descriptors / Indicators: Aching, Constant, Grimacing, Discomfort, Crying, Throbbing Pain Intervention(s): Limited activity within patient's tolerance, Monitored during session, Repositioned    Home Living Family/patient expects to be discharged to:: Private residence Living Arrangements: Children (daughter) Available Help at Discharge: Family;Available 24 hours/day Type of Home: House Home Access: Stairs to enter Entrance Stairs-Rails: None Entrance Stairs-Number of Steps: 4, daughters have been providing handheld assist   Home Layout: One level;Other (Comment) (one threshold step to get from bedroom to bathroom through living room; family recently placed [facility] in bedroom) Home Equipment: Rollator (4 wheels);BSC/3in1      Prior Function Prior Level of Function : Needs assist  Cognitive Assist : Mobility (cognitive) Mobility (Cognitive): Intermittent cues   Physical Assist : Mobility (physical);ADLs (physical) Mobility (physical): Gait;Stairs;Transfers ADLs (physical): Bathing;IADLs Mobility Comments: has been using rollator at home; but activity tolerance has significantly decreased in the last couple weeks and is now requring physical assistance for all transfers ADLs Comments: has assist from daughter for bathing in bathtub, up until a few weeks ago was IND with dressing/toileting     Extremity/Trunk Assessment   Upper Extremity Assessment Upper Extremity Assessment: Defer to OT evaluation    Lower Extremity Assessment Lower Extremity Assessment: RLE deficits/detail;LLE deficits/detail RLE Deficits / Details: pt unable to demonstrate AROM in sitting 2/2 pain RLE: Unable to fully assess due to pain LLE Deficits / Details: hip flex 4/5, knee flex/ext 3/5 AROM, DF 3/5 AROM    Cervical / Trunk Assessment Cervical / Trunk Assessment: Kyphotic  Communication    Communication Communication: No apparent difficulties Cueing Techniques: Verbal cues;Tactile cues;Visual cues  Cognition Arousal: Alert Behavior During Therapy: WFL for tasks assessed/performed, Lability Overall Cognitive Status: Impaired/Different from baseline Area of Impairment: Awareness, Problem solving, Safety/judgement                         Safety/Judgement: Decreased awareness of safety Awareness: Intellectual Problem Solving: Requires verbal cues, Requires tactile cues, Difficulty sequencing General Comments: Pt A,Ox4, but daughter reporting she is different from baseline        General Comments General comments (skin integrity, edema, etc.): 3.5L O2 via [place] t/o session    Exercises     Assessment/Plan    PT Assessment Patient needs continued PT services  PT Problem List Decreased strength;Decreased mobility;Decreased safety awareness;Decreased range of motion;Decreased knowledge of precautions;Decreased activity tolerance;Decreased cognition;Cardiopulmonary status limiting activity;Decreased balance;Decreased knowledge of use of DME;Pain       PT Treatment Interventions DME instruction;Gait training;Stair training;Functional mobility training;Therapeutic activities;Patient/family education;Cognitive remediation;Neuromuscular re-education;Balance training;Therapeutic exercise;Wheelchair mobility training    PT Goals (Current goals can be found in the Care Plan section)  Acute Rehab PT Goals Patient Stated Goal: to get her "bone better" PT Goal Formulation: With patient Time For Goal Achievement: 01/13/23 Potential to Achieve Goals: Fair    Frequency 7X/week     Co-evaluation PT/OT/SLP Co-Evaluation/Treatment: Yes Reason for Co-Treatment: To address functional/ADL transfers;For patient/therapist safety PT goals addressed during session: Mobility/safety with mobility;Balance;Proper use of DME;Strengthening/ROM         AM-PAC PT "6 Clicks"  Mobility   Outcome Measure Help needed turning from your back to your side while in a flat bed without using bedrails?: A Lot Help needed moving from lying on your back to sitting on the side of a flat bed without using bedrails?: A Lot Help needed moving to and from a bed to a chair (including a wheelchair)?: Total Help needed standing up from a chair using your arms (e.g., wheelchair or bedside chair)?: A Lot Help needed to walk in hospital room?: Total Help needed climbing 3-5 steps with a railing? : Total 6 Click Score: 9    End of Session Equipment Utilized During Treatment: Gait belt;Oxygen Activity Tolerance: Patient limited by pain Patient left: in bed;with call bell/phone within reach;with bed alarm set;with SCD's reapplied;with family/visitor present Nurse Communication: Mobility status;Weight bearing status;Precautions (pain) PT Visit Diagnosis: Unsteadiness on feet (R26.81);Other abnormalities of gait and mobility (R26.89);Muscle weakness (generalized) (M62.81);Pain Pain - Right/Left: Right Pain - part of body: Hip    Time: 8119-1478 PT Time Calculation (min) (ACUTE ONLY): 27 min   Charges:   PT Evaluation $PT Eval Moderate Complexity: 1 Mod   PT General Charges $$ ACUTE PT VISIT: 1 Visit          Shauna Hugh, SPT 12/30/2022, 12:37 PM

## 2022-12-30 NOTE — TOC Initial Note (Signed)
Transition of Care [facility]) - Initial/Assessment Note    Patient Details  Name: Kristi Clark MRN: [id number] Date of Birth: 07-Aug-1950  [facility]) CM/SW Contact:    Margarito Liner, LCSW Phone Number: 12/30/2022, 11:55 AM  Clinical Narrative:   Readmission prevention screen complete. CSW met with patient. Daughter [name] at bedside. CSW introduced role and explained that discharge planning would be discussed. PCP is Olevia Perches, DO. Daughter transports her to appointments. [facility] [address] [facility] [place]. No issues obtaining medications. Patient lives at home with her daughter, [name], son-in-law, 2 grandchildren, and 3 great-grandchildren. She is active with [facility]. Discussed SNF vs HH and having to revoke hospice services if they wanted to pursue these. [facility] is able to provide PT services although it won't be as extensive at home health. Patient and daughter are aware and agreeable. [facility] liaison will provide additional information on the services. Patient has an adjustable bed, rollator, BSC, shower chair, and oxygen (Adapt) at home. Daughter asked about getting a wheelchair ramp. [facility] liaison will check with patient's team social worker to see if she has any resources for this. CSW will also add resources to her AVS. No further concerns. CSW encouraged patient and her daughter to contact CSW as needed. CSW also left voicemail for daughter [name]. CSW will continue to follow patient and her family for support and facilitate return home once stable. Patient might need EMS transport home due to concern with stairs into the house. Patient and daughter are aware they may receive  a bill for this.               Expected Discharge Plan: Home w Hospice Care Barriers to Discharge: Continued Medical Work up   Patient Goals and CMS Choice            Expected Discharge Plan and Services     Post Acute Care Choice:  Resumption of Svcs/PTA Provider Living arrangements for the past 2 months: Single Family Home                                      Prior Living Arrangements/Services Living arrangements for the past 2 months: Single Family Home Lives with:: Adult Children, Relatives Patient language and need for interpreter reviewed:: Yes Do you feel safe going back to the place where you live?: Yes      Need for Family Participation in Patient Care: Yes (Comment) Care giver support system in place?: Yes (comment) Current home services: DME, Hospice Criminal Activity/Legal Involvement Pertinent to Current Situation/Hospitalization: No - Comment as needed  Activities of Daily Living   ADL Screening (condition at time of admission) Independently performs ADLs?: No Does the patient have a NEW difficulty with bathing/dressing/toileting/self-feeding that is expected to last >3 days?: No Does the patient have a NEW difficulty with getting in/out of bed, walking, or climbing stairs that is expected to last >3 days?: No Does the patient have a NEW difficulty with communication that is expected to last >3 days?: No Is the patient deaf or have difficulty hearing?: No Does the patient have difficulty seeing, even when wearing glasses/contacts?: No Does the patient have difficulty concentrating, remembering, or making decisions?: No  Permission Sought/Granted Permission sought to share information with : Facility Medical sales representative, Family Supports Permission granted to share information with : Yes, Verbal Permission Granted  Share Information with NAME:  Ivin Booty, Sherley Bounds  Permission granted to share info w AGENCY: [facility]  Permission granted to share info w Relationship: Daughters  Permission granted to share info w Contact Information: [name]: [phone]:  [phone]  Emotional Assessment Appearance:: Appears stated age Attitude/Demeanor/Rapport: Engaged,  Gracious Affect (typically observed): Accepting, Appropriate, Calm, Pleasant Orientation: : Oriented to Self, Oriented to Place, Oriented to  Time, Oriented to Situation Alcohol / Substance Use: Not Applicable Psych Involvement: No (comment)  Admission diagnosis:  Closed right hip fracture, initial encounter (HCC) [S72.001A] Falls, initial encounter [K93.XXXA] Hip fracture, right, closed, initial encounter (HCC) [S72.001A] Patient Active Problem List   Diagnosis Date Noted   Displaced intertrochanteric fracture of right femur, initial encounter for closed fracture (HCC) 12/28/2022   Septic shock (HCC) 12/01/2022   Elevated LFTs 11/30/2022   CAP (community acquired pneumonia) 11/30/2022   AKI (acute kidney injury) (HCC) 11/30/2022   Pleural effusion on right 11/30/2022   Protein-calorie malnutrition, moderate (HCC) 04/20/2022   Acute on chronic hypoxic respiratory failure (HCC) 04/18/2022   Atrial fibrillation with RVR (HCC) 04/18/2022   Sepsis due to pneumonia (HCC) 04/18/2022   Acute on chronic diastolic CHF (congestive heart failure) (HCC) 04/18/2022   Hypokalemia 04/18/2022   Paroxysmal A-fib (HCC) 03/05/2022   Fall at home, initial encounter 03/05/2022   History of CAD (coronary artery disease) 03/05/2022   Chronic diastolic CHF (congestive heart failure) (HCC) 03/05/2022   DNR (do not resuscitate) 03/05/2022   Acute on chronic respiratory failure with hypoxemia (HCC) 03/04/2022   Hypotension due to drugs 02/10/2022   Confusion 02/10/2022   Depression, recurrent (HCC) 02/02/2022   Normocytic anemia 01/13/2022   Atrial fibrillation with RVR (HCC) 01/13/2022   Bilateral pneumonia 11/23/2021   Chronic respiratory failure with hypoxia (HCC) 09/21/2021   Valvular vegetation 05/06/2021   Malnutrition of moderate degree 04/29/2021   Unintentional weight loss 04/28/2021   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 (HCC) 03/15/2021   Aortic  atherosclerosis (HCC) 05/27/2020   COPD (chronic obstructive pulmonary disease) (HCC) 02/17/2020   Postherpetic neuralgia 11/24/2019   Nicotine dependence, cigarettes, w unsp disorders 10/17/2017   IBS (irritable bowel syndrome) 04/04/2017   Goals of care, counseling/discussion 10/03/2016   Coronary artery disease 10/03/2016   Iron deficiency anemia 10/03/2016   Hypertension 03/06/2015   Allergic rhinitis 09/03/2014   GERD (gastroesophageal reflux disease) 09/03/2014   Hyperlipidemia 09/03/2014   COPD, severe (HCC) 08/08/2013   Pulmonary nodules 08/08/2013   PCP:  Dorcas Carrow, DO Pharmacy:   [facility] [address] ([address]), [place] - [address] [place] [address] Phone: [phone] Fax: [phone]  [facility] - [place], [place] - [address] [facility] [address] [place] [address] [place] [address] Phone: [phone] Fax: [phone]     Social Determinants of Health (SDOH) Social History: SDOH Screenings   Food Insecurity: No Food Insecurity (12/28/2022)  Housing: Patient Declined (12/28/2022)  Transportation Needs: No Transportation Needs (12/28/2022)  Utilities: Not At Risk (12/28/2022)  Alcohol Screen: Low Risk  (06/13/2022)  Depression (PHQ2-9): Low Risk  (11/25/2022)  Financial Resource Strain: Low Risk  (06/13/2022)  Physical Activity: Insufficiently Active (06/13/2022)  Social Connections: Socially Isolated (06/13/2022)  Stress: No Stress Concern Present (06/13/2022)  Tobacco Use: Medium Risk (12/29/2022)   SDOH Interventions:     Readmission Risk Interventions    12/30/2022   11:43 AM 12/01/2022   11:35 AM 03/06/2022   10:27 AM  Readmission Risk Prevention Plan  Transportation Screening Complete Complete Complete  PCP or Specialist Appt within 3-5 Days Complete  Complete  HRI or Home Care Consult Complete Complete Complete  Social Work Consult for Recovery Care Planning/Counseling Complete  Complete Complete  Palliative Care Screening Not Applicable Not Applicable Not Applicable  Medication Review Oceanographer) Complete Complete Complete

## 2022-12-30 NOTE — TOC Transition Note (Signed)
Transition of Care [facility]) - CM/SW Discharge Note   Patient Details  Name: Kristi Clark MRN: [id number] Date of Birth: 1951/01/06  Transition of [facility]) CM/SW Contact:  Margarito Liner, LCSW Phone Number: 12/30/2022, 5:00 PM   Clinical Narrative: Patient has orders to discharge home today. [facility] liaison is aware. Per RN, daughter is here and will transport. No further concerns. CSW signing off.    Final next level of care: Home w Hospice Care Barriers to Discharge: Barriers Resolved   Patient Goals and CMS Choice   Choice offered to / list presented to : Patient, Adult Children  Discharge Placement                  Patient to be transferred to facility by: Daughter   Patient and family notified of of transfer: 12/30/22  Discharge Plan and Services Additional resources added to the After Visit Summary for       Post Acute Care Choice: Resumption of Svcs/PTA Provider                               Social Determinants of Health (SDOH) Interventions SDOH Screenings   Food Insecurity: No Food Insecurity (12/28/2022)  Housing: Patient Declined (12/28/2022)  Transportation Needs: No Transportation Needs (12/28/2022)  Utilities: Not At Risk (12/28/2022)  Alcohol Screen: Low Risk  (06/13/2022)  Depression (PHQ2-9): Low Risk  (11/25/2022)  Financial Resource Strain: Low Risk  (06/13/2022)  Physical Activity: Insufficiently Active (06/13/2022)  Social Connections: Socially Isolated (06/13/2022)  Stress: No Stress Concern Present (06/13/2022)  Tobacco Use: Medium Risk (12/29/2022)     Readmission Risk Interventions    12/30/2022   11:43 AM 12/01/2022   11:35 AM 03/06/2022   10:27 AM  Readmission Risk Prevention Plan  Transportation Screening Complete Complete Complete  PCP or Specialist Appt within 3-5 Days Complete  Complete  HRI or Home Care Consult Complete Complete Complete  Social Work Consult for Recovery Care Planning/Counseling Complete Complete  Complete  Palliative Care Screening Not Applicable Not Applicable Not Applicable  Medication Review Oceanographer) Complete Complete Complete

## 2022-12-30 NOTE — Progress Notes (Addendum)
1545 Assumed care of pt at this time. Pt has not voided since foley removed by previous nurse this morning. Per report pt was just bladder scanned showing and foley order was placed for pt to d/c home with  1800 Pt states she did not want to be d/c with foley has voided and post void bladder scan shows 39ml. Made Dr Clide Dales aware , verbal order with readback that pt may d/c home without foley. Nurse educated pt and daughter [name] about s/s of urinary retention and to follow up with PCP  1836 D/C AVS completed and reviewed with pt and daughter. All opportunities for questions answered and clarified. IV removed. Pt will be wheeled down to car at medical mall entrance via wheelchair once ride arrives.

## 2022-12-30 NOTE — Progress Notes (Signed)
Nutrition Follow-up  DOCUMENTATION CODES:   Non-severe (moderate) malnutrition in context of chronic illness  INTERVENTION:   -Continue Ensure Enlive po BID, each supplement provides 350 kcal and 20 grams of protein.  -Continue MVI with minerals daily -Continue regular diet  NUTRITION DIAGNOSIS:   Moderate Malnutrition related to chronic illness (COPD) as evidenced by mild fat depletion, moderate fat depletion, moderate muscle depletion, severe muscle depletion.  Ongoing  GOAL:   Patient will meet greater than or equal to 90% of their needs  Progressing   MONITOR:   PO intake, Supplement acceptance  REASON FOR ASSESSMENT:   Consult Hip fracture protocol  ASSESSMENT:   Pt with medical history significant for end-stage COPD, chronic hypoxic respiratory failure on 3 L, atrial fibrillation not on AC, CAD, HFpEF, hypertension, hyperlipidemia. Pt brought in due to fall at home while using her walker to get to the bathroom she fell on her right side and did not strike her head or lose consciousness.  10/24- s/p Procedure: Cephalomedullary nail for Right comminuted intertrochanteric femur fracture  Reviewed I/O's: -117 ml x 24 hours and +128 ml since admission  UOP: 875 ml x 24 hours   Spoke with pt and family members at bedside. Family reports pt did not sleep well last night and is a little bit confused (they attribute this from side effects of anesthesia from surgery yesterday). Pt pleasant, but unable to answer most questions and deferred questioning to her daughters. Daughters reports pt has a good appetite at home; she eats 3 meals per day (usually 50% of what is given to her), snacks, and 2 Premier Protein shakes daily. Pt has difficulty eating harder textured foods, so family provides softer textured foods for her. Breakfast tray arrived during visit (bacon, muffin, pancakes, and coffee), which pt reports she is able to tolerate. Offered to downgrade diet for ease of  intake, but family would prefer to continue regular diet for wider variety of options. Family has been ordering her meal trays to best select preferred foods.   Pt daughter endorses pt has lost about 50# within the past year secondary to repeated hospitalizations to COPD. Pt is now around 70# and weight has been stable over the past 6 months or so. Reviewed wt hx; wt has been stable over the past 4 months. Pt family reports that pt has been less steady on her feet over the past few months and attributes this to the fall.    Discussed importance of good meal and supplement intake to promote healing. Pt amenable to supplements, prefers chocolate.   Medications reviewed and include colace.   Labs reviewed.    NUTRITION - FOCUSED PHYSICAL EXAM:  Flowsheet Row Most Recent Value  Orbital Region Mild depletion  Upper Arm Region Moderate depletion  Thoracic and Lumbar Region Moderate depletion  Buccal Region Mild depletion  Temple Region Mild depletion  Clavicle Bone Region Severe depletion  Clavicle and Acromion Bone Region Severe depletion  Scapular Bone Region Severe depletion  Dorsal Hand Moderate depletion  Patellar Region Severe depletion  Anterior Thigh Region Severe depletion  Posterior Calf Region Severe depletion  Edema (RD Assessment) None  Hair Reviewed  Eyes Reviewed  Mouth Reviewed  Skin Reviewed  Nails Reviewed       Diet Order:   Diet Order             Diet regular Fluid consistency: Thin  Diet effective now  EDUCATION NEEDS:   Education needs have been addressed  Skin:  Skin Assessment: Reviewed RN Assessment  Last BM:  12/27/22  Height:   Ht Readings from Last 1 Encounters:  12/28/22 4\' 11"  (1.499 m)    Weight:   Wt Readings from Last 1 Encounters:  12/28/22 49.9 kg    Ideal Body Weight:  44.7 kg  BMI:  Body mass index is 22.22 kg/m.  Estimated Nutritional Needs:   Kcal:  1500-1700  Protein:  75-90 grams  Fluid:   > 1.5 L    Levada Schilling, RD, LDN, CDCES Registered Dietitian III Certified Diabetes Care and Education Specialist Please refer to [facility] for RD and/or RD on-call/weekend/after hours pager

## 2022-12-30 NOTE — Progress Notes (Signed)
   ORTHOPAEDIC PROGRESS NOTE  s/p Procedure(s): INTRAMEDULLARY (IM) NAIL INTERTROCHANTERIC - R hip CMN  DOS: 12/29/2022  SUBJECTIVE: No issues over night.  She feels better.  Spasm pain has improved.  Her appetite is good and she is drinking well.  Worked with PT this morning   OBJECTIVE: PE:  Alert and oriented, no acute distress Lateral right hip dressings are clean, dry and intact No bruising in the thigh Toes are Kristi Clark Active motion TA/EHL Sensation intact to the right foot  Vitals:   12/30/22 0413 12/30/22 0844  BP: (!) 173/44 (!) 169/44  Pulse: 68 68  Resp: 18 17  Temp: 98.6 F (37 C) 98.1 F (36.7 C)  SpO2: 99% 100%      Latest Ref Rng & Units 12/30/2022    5:41 AM 12/29/2022    7:06 AM 12/28/2022    3:11 PM  CBC  WBC 4.0 - 10.5 K/uL 7.9  6.6  5.6   Hemoglobin 12.0 - 15.0 g/dL 8.5  16.1  09.6   Hematocrit 36.0 - 46.0 % 25.8  31.0  35.7   Platelets 150 - 400 K/uL 173  175  209      ASSESSMENT: Kristi Clark is a 72 y.o. female doing well postoperatively.  Stable, POD#1.  Monitor Hb  PLAN: Weightbearing: WBAT RLE Incisional and dressing care: Reinforce dressings as needed Orthopedic device(s): None VTE prophylaxis: Recommend 81 mg Aspirin BID; SCDs, early ambulation Pain control: As needed.  Prefer oral medications.  Judicious use of narcotics Follow - up plan: 2 weeks;  sutures can be trimmed approximately 2 weeks postop if hospice care is comfortable.  If so, I can see her in clinic in approximately 6 weeks.    I will be seeing patients in [place] on 11/14.  Clinic available in [place] as well.  Happy to see her at any time if there are issues.    Contact information:     Kristi Ferrelli A. Clark Schimke, MD MS [facility] [address] [place],  [place]  [address] Phone: [phone] Fax: [phone]

## 2022-12-30 NOTE — Progress Notes (Signed)
[facility] Patient Visit   [name] is a current hospice patient, with hospice diagnosos of COPD, who was admitted to [facility] on 12/28/22 with diagnosis of a hip fracture after a fall. [facility] was notified of admission on 10.23.24. Per Dr. Patric Dykes, hospice physician, this is a related hospital admission.    Met with patient and her daughter, [name], today at the bedside and corresponded via text with daughter, [name].  Patient sitting up in bed, alert and oriented with 02 in place.  Patient stated that she has been a bit emotional today.  PT and OT recommending SNF rehab, but patient and family want to continue Hospice services and have Hospice provide PT in the home.  Informed patient and [name] that the Physical Therapist will evaluate patient when she returns home and then determine visit frequency. According to [name], patient's wheelchair is old.  Informed them that Hospice will order a new one for them.  Patient prefers to keep using her adjustable bed at home since guard rails on Hospital beds provided by Hospice are metal.  Daughter, [name], requesting incontinence pads and adult diapers for patient.  These items will be shipped to the home.     Patient remains GIP appropriate due to need for stabilization of fracture, IV antibiotics, and IV pain medications.   Vital Signs: 98.1,  169/44  68,  17,  02 at 2L/min   I&O:  907.08/1023   Abnormal labs:  Glucose 134 Calcium 8.4,  RBC 2.77,  Hemoglobin 8.5,  HCT 25.8   Diagnostics: Chest xray, CT head, and CT cervical spine, all without significant findings. Xray of Femur and Pelvis: IMPRESSION: Comminuted and displaced intertrochanteric right proximal femur fracture. No new diagnostic 10.25.24   IV/PRN Meds: hydrocodone-acetaminophen 1-2 tabs q 6hrs prn po,  morphine 2mg  q 1hr prn IV X1,  ancef 2g q 8hr IV, Marcaine 0.5% inj,  Apresoline 10mg q 6 hr prn IV    Problem List:  * Hip fracture, right, closed, initial encounter [facility]) Patient has a moderate to surgical risk due to her pulmonary and cardiac disease.  Chest xray is stable with what sounds like RUL PNA from aspiration.  Will get cardiology - Dr.Khan / Pulmonary Dr Aundria Rud  consult for surgical clearance.  Aspiration/ fall precaution. Skin and wound care.    COPD (chronic obstructive pulmonary disease) (HCC) Cont with PRN albuterol and trelegy ellipta.   IS .     Coronary artery disease Currently patient will be n.p.o. and will resume metoprolol and Imdur postop.   Hypertension                                                       Prn hydralazine 10 mg q6 at 150 and above systolic as home bp meds are held .   DVT prophylaxis:  Held        Discharge Planning: Ongoing- Patient will return home at dc.  Will need wheelchair and home Pt.     Family Contact: Spoke with daughter, [name], and communicated via text with [name].     IDT: Updated   Goals of Care: Full Code currently   Should patient need ambulance transfer at discharge- please use [facility]) as they contract this service for our active hospice patients.  Ree Kida  [facility] Liaison [phone]

## 2022-12-30 NOTE — Plan of Care (Signed)

## 2022-12-30 NOTE — Discharge Instructions (Addendum)
Home Ramps  [facility] For permanent ramp Call [place] office: [phone] Will have to obtain documentation to confirm medical and financial eligibility. Not a quick process. Can take up to a year to complete.  [employer] For temporary ramp Call [phone]

## 2022-12-30 NOTE — Discharge Summary (Signed)
Physician Discharge Summary   Patient: Kristi Clark MRN: [id number] DOB: 1950/06/20  Admit date:     12/28/2022  Discharge date: 12/30/22  Discharge Physician: Marcelino Duster   PCP: Dorcas Carrow, DO   Recommendations at discharge:    PCP follow up in 1 week. Ortho follow up as suggested.  Discharge Diagnoses: Principal Problem:   Displaced intertrochanteric fracture of right femur, initial encounter for closed fracture (HCC) Active Problems:   COPD (chronic obstructive pulmonary disease) (HCC)   Hypertension   Coronary artery disease   Protein-calorie malnutrition, moderate (HCC)  Resolved Problems:   * No resolved hospital problems. *  Hospital Course: 72 y.o. 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  Patient followed by hospice at home planned for same at discharge as per her daughter. PT at home, wheel chair to be arranged by hospice services.  Assessment and Plan: * Displaced intertrochanteric fracture of right femur, initial encounter for closed fracture [facility]) Patient has a moderate to surgical risk due to her pulmonary and cardiac disease.  Chest xray is stable with what sounds like RUL PNA from aspiration.  Status post cephalomedullary nail for right comminuted intertrochanteric femur fracture by Ortho. Continue pain control. Aspirin 81 bid for 2 weeks for DVT prophylaxis per ortho. WBAT RLE PT at home, wheel chair to be arranged by hospice services. Ortho follow up in 2 weeks. DC plan Home with hospice services.  COPD (chronic obstructive pulmonary disease) (HCC) Cont with PRN albuterol and  trelegy ellipta.   Incentive spirometry.  Coronary artery disease Currently patient will be n.p.o. and will resume metoprolol and Imdur postop.  Hypertension Continue home antihypertensive regimen.  I resumed her home medications. Home hospice services resumed, PT at home, wheel chair to be arranged. Discussed with daughter [name] over phone and updated her regarding discharge plan.       Consultants: Orthopedic surgery Procedures performed: Status post cephalomedullary nail for right comminuted intertrochanteric femur fracture   Disposition: Hospice care Diet recommendation:  Discharge Diet Orders (From admission, onward)     Start     Ordered   12/30/22 0000  Diet - low sodium heart healthy        12/30/22 1521           Cardiac diet DISCHARGE MEDICATION: Allergies as of 12/30/2022       Reactions   Clindamycin/lincomycin Hives   Prednisone Other (See Comments)   Psychosis   Amoxicillin Rash   Avelox [moxifloxacin Hcl In Nacl] Rash   Codeine Sulfate Nausea Only   Penicillins Rash   Sulfa Antibiotics Rash        Medication List     TAKE these medications    albuterol (2.5 MG/3ML) 0.083% nebulizer solution Commonly known as: PROVENTIL Take 3 mLs (2.5 mg total) by nebulization every 4 (four) hours as needed for wheezing or shortness of breath.   albuterol 108 (90 Base) MCG/ACT inhaler Commonly known as: VENTOLIN HFA Inhale 2 puffs into the lungs every 6 (six) hours as needed for wheezing or shortness of breath.   ALPRAZolam 0.25 MG tablet Commonly known as: XANAX Take 1 tablet (0.25 mg total) by mouth 2 (two) times daily  as needed for anxiety.   aspirin 81 MG chewable tablet Chew 1 tablet (81 mg total) by mouth 2 (two) times daily.   AYR SALINE NASAL GEL NA Place 1 Application into the nose as needed (dry nares).   Blood Pressure Monitor/Wrist Kit Take Blood pressure as needed, Dx: I12.9   feeding supplement Liqd Take 237 mLs by mouth 2 (two)  times daily between meals. Start taking on: December 31, 2022   fluticasone 50 MCG/ACT nasal spray Commonly known as: FLONASE Place 2 sprays into both nostrils daily. What changed:  when to take this reasons to take this   furosemide 20 MG tablet Commonly known as: LASIX AS needed   guaiFENesin 600 MG 12 hr tablet Commonly known as: MUCINEX Take 1 tablet (600 mg total) by mouth 2 (two) times daily.   isosorbide mononitrate 60 MG 24 hr tablet Commonly known as: IMDUR Take 1 tablet (60 mg total) by mouth 2 (two) times daily. What changed: when to take this   methocarbamol 500 MG tablet Commonly known as: ROBAXIN Take 1 tablet (500 mg total) by mouth every 8 (eight) hours as needed for muscle spasms.   metoprolol succinate 50 MG 24 hr tablet Commonly known as: TOPROL-XL Take 1 tablet (50 mg total) by mouth daily. Take with or immediately following a meal.   mirtazapine 45 MG tablet Commonly known as: REMERON Take 1 tablet (45 mg total) by mouth at bedtime.   oxyCODONE 5 MG immediate release tablet Commonly known as: Oxy IR/ROXICODONE Take 1-2 tablets (5-10 mg total) by mouth every 4 (four) hours as needed for moderate pain or severe pain.   pantoprazole 40 MG tablet Commonly known as: Protonix Take 1 tablet (40 mg total) by mouth 2 (two) times daily.   phenazopyridine 100 MG tablet Commonly known as: PYRIDIUM Take 1 tablet (100 mg total) by mouth 3 (three) times daily with meals.   polyethylene glycol powder 17 GM/SCOOP powder Commonly known as: GLYCOLAX/MIRALAX Take 17 g by mouth 3 (three) times daily. What changed:  when to take this reasons to take this   SEROquel XR 50 MG Tb24 24 hr tablet Generic drug: QUEtiapine Take 2 tablets (100 mg total) by mouth at bedtime.   tamsulosin 0.4 MG Caps capsule Commonly known as: FLOMAX Take 1 capsule (0.4 mg total) by mouth daily.   Trelegy Ellipta 100-62.5-25 MCG/ACT Aepb Generic drug:  Fluticasone-Umeclidin-Vilant 100 mcg DAILY (route: inhalation)        Follow-up Information     Johnson, Megan P, DO Follow up in 1 week(s).   Specialty: Family Medicine Contact information: [address] [place] [address] [phone]         Oliver Barre, MD Follow up in 2 week(s).   Specialties: Orthopedic Surgery, Sports Medicine Contact information: [address] [place] [address] [phone]                Discharge Exam: Ceasar Mons Weights   12/28/22 1501  Weight: 49.9 kg   General - Elderly Caucasian female, mild distress secondary to pain HEENT - PERRLA, EOMI, atraumatic head, non tender sinuses. Lung - Clear, rales, rhonchi, wheezes. Heart - S1, S2 heard, no murmurs, rubs, trace pedal edema Neuro - Alert, awake pleasantly confused, non focal exam. Skin - Warm and dry. Lateral right hip dressings are clean, dry and intact.   Condition at discharge: stable  The results of significant diagnostics from this hospitalization (including imaging, microbiology, ancillary and laboratory) are listed  below for reference.   Imaging Studies: DG Pelvis Portable  Result Date: 12/29/2022 CLINICAL DATA:  Closed intertrochanteric fracture right femur. Status post intramedullary nail. EXAM: PORTABLE PELVIS 1-2 VIEWS; RIGHT FEMUR PORTABLE 2 VIEW COMPARISON:  Pelvis and right femur radiographs 12/28/2022 FINDINGS: There is diffuse decreased bone mineralization. New right femoral long intramedullary nail fixation of the previously seen comminuted and displaced proximal femoral intertrochanteric fracture. There is improved, now anatomic alignment. No hardware complication is seen. Moderate patellofemoral joint space narrowing. No knee joint effusion is seen. Expected postoperative subcutaneous air about the lateral right thigh. Mild right femoroacetabular joint space narrowing. Mild-to-moderate atherosclerotic calcifications. IMPRESSION: New long right femoral intramedullary  nail fixation of the previously seen comminuted and displaced proximal femoral intertrochanteric fracture. Improved, now anatomic alignment. No hardware complication is seen. Electronically Signed   By: Neita Garnet M.D.   On: 12/29/2022 13:31   DG FEMUR PORT, MIN 2 VIEWS RIGHT  Result Date: 12/29/2022 CLINICAL DATA:  Closed intertrochanteric fracture right femur. Status post intramedullary nail. EXAM: PORTABLE PELVIS 1-2 VIEWS; RIGHT FEMUR PORTABLE 2 VIEW COMPARISON:  Pelvis and right femur radiographs 12/28/2022 FINDINGS: There is diffuse decreased bone mineralization. New right femoral long intramedullary nail fixation of the previously seen comminuted and displaced proximal femoral intertrochanteric fracture. There is improved, now anatomic alignment. No hardware complication is seen. Moderate patellofemoral joint space narrowing. No knee joint effusion is seen. Expected postoperative subcutaneous air about the lateral right thigh. Mild right femoroacetabular joint space narrowing. Mild-to-moderate atherosclerotic calcifications. IMPRESSION: New long right femoral intramedullary nail fixation of the previously seen comminuted and displaced proximal femoral intertrochanteric fracture. Improved, now anatomic alignment. No hardware complication is seen. Electronically Signed   By: Neita Garnet M.D.   On: 12/29/2022 13:31   DG HIP UNILAT WITH PELVIS 2-3 VIEWS RIGHT  Result Date: 12/29/2022 CLINICAL DATA:  Closed comminuted intertrochanteric fracture right femur. Right intramedullary nail. Intraoperative fluoroscopy. EXAM: DG HIP (WITH OR WITHOUT PELVIS) 2-3V RIGHT COMPARISON:  Right femoral radiographs 12/28/2022 FINDINGS: Images were performed intraoperatively without the presence of a radiologist. The patient is undergoing placement of right femoral intramedullary nail fixating the previously seen intertrochanteric fracture of the proximal right femur. Improved, normal alignment. No hardware  complication is seen. Total fluoroscopy images: 4 Total fluoroscopy time: 60 seconds Total dose: Radiation Exposure Index (as provided by the fluoroscopic device): 6.96 mGy air Kerma Please see intraoperative findings for further detail. IMPRESSION: Intraoperative fluoroscopy for right femoral intramedullary nail placement. Electronically Signed   By: Neita Garnet M.D.   On: 12/29/2022 13:30   DG C-Arm 1-60 Min-No Report  Result Date: 12/29/2022 Fluoroscopy was utilized by the requesting physician.  No radiographic interpretation.   DG C-Arm 1-60 Min-No Report  Result Date: 12/29/2022 Fluoroscopy was utilized by the requesting physician.  No radiographic interpretation.   CT Head Wo Contrast  Result Date: 12/28/2022 CLINICAL DATA:  Recent fall with headaches and neck pain, initial encounter EXAM: CT HEAD WITHOUT CONTRAST CT CERVICAL SPINE WITHOUT CONTRAST TECHNIQUE: Multidetector CT imaging of the head and cervical spine was performed following the standard protocol without intravenous contrast. Multiplanar CT image reconstructions of the cervical spine were also generated. RADIATION DOSE REDUCTION: This exam was performed according to the departmental dose-optimization program which includes automated exposure control, adjustment of the mA and/or kV according to patient size and/or use of iterative reconstruction technique. COMPARISON:  04/28/2021 FINDINGS: CT HEAD FINDINGS Brain: No evidence of acute infarction, hemorrhage, hydrocephalus, extra-axial collection or mass lesion/mass  effect. Chronic white matter ischemic changes are again noted. Vascular: No hyperdense vessel or unexpected calcification. Skull: Normal. Negative for fracture or focal lesion. Sinuses/Orbits: No acute finding. Other: None. CT CERVICAL SPINE FINDINGS Alignment: Within normal limits. Skull base and vertebrae: 7 cervical segments are well visualized. Vertebral body height is well maintained. Facet hypertrophic changes are  noted. No acute fracture or acute facet abnormality is noted. The odontoid is within normal limits. Soft tissues and spinal canal: Surrounding soft tissue structures are within normal limits. Upper chest: Visualized lung apices no significant emphysematous change. Biapical pleural and parenchymal scarring is noted stable from the prior exam. Other: None IMPRESSION: CT of the head: Chronic white matter ischemic changes without acute abnormality. CT of the cervical spine: Degenerative change without acute bony abnormality. Electronically Signed   By: Alcide Clever M.D.   On: 12/28/2022 18:55   CT Cervical Spine Wo Contrast  Result Date: 12/28/2022 CLINICAL DATA:  Recent fall with headaches and neck pain, initial encounter EXAM: CT HEAD WITHOUT CONTRAST CT CERVICAL SPINE WITHOUT CONTRAST TECHNIQUE: Multidetector CT imaging of the head and cervical spine was performed following the standard protocol without intravenous contrast. Multiplanar CT image reconstructions of the cervical spine were also generated. RADIATION DOSE REDUCTION: This exam was performed according to the departmental dose-optimization program which includes automated exposure control, adjustment of the mA and/or kV according to patient size and/or use of iterative reconstruction technique. COMPARISON:  04/28/2021 FINDINGS: CT HEAD FINDINGS Brain: No evidence of acute infarction, hemorrhage, hydrocephalus, extra-axial collection or mass lesion/mass effect. Chronic white matter ischemic changes are again noted. Vascular: No hyperdense vessel or unexpected calcification. Skull: Normal. Negative for fracture or focal lesion. Sinuses/Orbits: No acute finding. Other: None. CT CERVICAL SPINE FINDINGS Alignment: Within normal limits. Skull base and vertebrae: 7 cervical segments are well visualized. Vertebral body height is well maintained. Facet hypertrophic changes are noted. No acute fracture or acute facet abnormality is noted. The odontoid is within  normal limits. Soft tissues and spinal canal: Surrounding soft tissue structures are within normal limits. Upper chest: Visualized lung apices no significant emphysematous change. Biapical pleural and parenchymal scarring is noted stable from the prior exam. Other: None IMPRESSION: CT of the head: Chronic white matter ischemic changes without acute abnormality. CT of the cervical spine: Degenerative change without acute bony abnormality. Electronically Signed   By: Alcide Clever M.D.   On: 12/28/2022 18:55   DG Chest 1 View  Result Date: 12/28/2022 CLINICAL DATA:  Status post fall with right hip fracture EXAM: CHEST  1 VIEW COMPARISON:  12/02/2022 FINDINGS: Improving opacity in the right upper lobe, mild residual streaky opacities may represent scarring. Stable cardiomegaly. Unchanged mediastinal contours. Persistent blunting of right costophrenic angle. No pneumothorax. No acute osseous abnormalities are seen. IMPRESSION: 1. Improving opacity in the right upper lobe, mild residual streaky opacities may represent scarring. 2. Stable cardiomegaly. 3. Persistent blunting of right costophrenic angle, may represent scarring or small effusion. Electronically Signed   By: Narda Rutherford M.D.   On: 12/28/2022 17:51   DG Pelvis 1-2 Views  Result Date: 12/28/2022 CLINICAL DATA:  Status post fall onto right side with pain. EXAM: PELVIS - 1-2 VIEW; RIGHT FEMUR 2 VIEWS COMPARISON:  None Available. FINDINGS: Pelvis: Comminuted and displaced intertrochanteric right proximal femur fracture. There is involvement of the lesser and greater trochanters. Femoral head remains seated, no hip dislocation. Intact pubic rami. No pubic symphyseal or sacroiliac diastasis. Femur: The distal femur is intact. No additional  distal femur fracture. Knee alignment is maintained. IMPRESSION: Comminuted and displaced intertrochanteric right proximal femur fracture. Electronically Signed   By: Narda Rutherford M.D.   On: 12/28/2022 17:40    DG FEMUR, MIN 2 VIEWS RIGHT  Result Date: 12/28/2022 CLINICAL DATA:  Status post fall onto right side with pain. EXAM: PELVIS - 1-2 VIEW; RIGHT FEMUR 2 VIEWS COMPARISON:  None Available. FINDINGS: Pelvis: Comminuted and displaced intertrochanteric right proximal femur fracture. There is involvement of the lesser and greater trochanters. Femoral head remains seated, no hip dislocation. Intact pubic rami. No pubic symphyseal or sacroiliac diastasis. Femur: The distal femur is intact. No additional distal femur fracture. Knee alignment is maintained. IMPRESSION: Comminuted and displaced intertrochanteric right proximal femur fracture. Electronically Signed   By: Narda Rutherford M.D.   On: 12/28/2022 17:40   CT Angio Chest Pulmonary Embolism (PE) W or WO Contrast  Result Date: 12/03/2022 CLINICAL DATA:  PE suspected EXAM: CT ANGIOGRAPHY CHEST WITH CONTRAST TECHNIQUE: Multidetector CT imaging of the chest was performed using the standard protocol during bolus administration of intravenous contrast. Multiplanar CT image reconstructions and MIPs were obtained to evaluate the vascular anatomy. RADIATION DOSE REDUCTION: This exam was performed according to the departmental dose-optimization program which includes automated exposure control, adjustment of the mA and/or kV according to patient size and/or use of iterative reconstruction technique. CONTRAST:  75mL OMNIPAQUE IOHEXOL 350 MG/ML SOLN COMPARISON:  11/30/2022 FINDINGS: Cardiovascular: Satisfactory opacification of the pulmonary arteries to the segmental level. No evidence of pulmonary embolism. Cardiomegaly. Three-vessel coronary artery calcifications and stents. Trace pericardial effusion. Severe aortic atherosclerosis. Mediastinum/Nodes: Numerous unchanged enlarged mediastinal and hilar lymph nodes. Thyroid gland, trachea, and esophagus demonstrate no significant findings. Lungs/Pleura: Severe emphysema. Diffuse bilateral bronchial wall thickening.  Small bilateral pleural effusions. Interlobular septal thickening throughout and heterogeneous airspace consolidation involving the posterior and inferior right upper lobe (series 7, image 64) Upper Abdomen: No acute abnormality. Musculoskeletal: No chest wall abnormality. No acute osseous findings. Review of the MIP images confirms the above findings. IMPRESSION: 1. Negative examination for pulmonary embolism. 2. Heterogeneous airspace consolidation involving the posterior and inferior right upper lobe, consistent with infection. 3. Interlobular septal thickening throughout the lungs and small bilateral pleural effusions. 4. Severe emphysema and diffuse bilateral bronchial wall thickening. 5. Numerous unchanged enlarged mediastinal and hilar lymph nodes, likely reactive 6. Cardiomegaly and coronary artery disease. Aortic Atherosclerosis (ICD10-I70.0) and Emphysema (ICD10-J43.9). Electronically Signed   By: Jearld Lesch M.D.   On: 12/03/2022 14:39   DG Chest Port 1 View  Result Date: 12/02/2022 CLINICAL DATA:  Hypoxia. Worsening shortness of breath today. Admitted for septic shock 2 days ago. EXAM: PORTABLE CHEST 1 VIEW COMPARISON:  Radiographs 11/30/2022, 04/18/2022 and 03/04/2022. Chest CT 11/30/2022. FINDINGS: 1748 hours. The heart size and mediastinal contours are stable with aortic atherosclerosis. There has been partial clearing of the dense airspace disease peripherally in the right upper lobe seen on the most recent radiographs. Coarse interstitial opacities throughout the lungs are grossly unchanged. There may be small bilateral pleural effusions. No pneumothorax. The bones appear unchanged. IMPRESSION: Interval partial clearing of right upper lobe pneumonia. Possible new small bilateral pleural effusions. Otherwise stable appearance of the chest with coarse interstitial opacities and underlying emphysema. Electronically Signed   By: Carey Bullocks M.D.   On: 12/02/2022 19:07     Microbiology: Results for orders placed or performed during the hospital encounter of 11/30/22  SARS Coronavirus 2 by RT PCR (hospital order, performed in [facility]  hospital lab) *cepheid single result test* Anterior Nasal Swab     Status: None   Collection Time: 11/30/22 12:05 PM   Specimen: Anterior Nasal Swab  Result Value Ref Range Status   SARS Coronavirus 2 by RT PCR NEGATIVE NEGATIVE Final    Comment: (NOTE) SARS-CoV-2 target nucleic acids are NOT DETECTED.  The SARS-CoV-2 RNA is generally detectable in upper and lower respiratory specimens during the acute phase of infection. The lowest concentration of SARS-CoV-2 viral copies this assay can detect is 250 copies / mL. A negative result does not preclude SARS-CoV-2 infection and should not be used as the sole basis for treatment or other patient management decisions.  A negative result may occur with improper specimen collection / handling, submission of specimen other than nasopharyngeal swab, presence of viral mutation(s) within the areas targeted by this assay, and inadequate number of viral copies (<250 copies / mL). A negative result must be combined with clinical observations, patient history, and epidemiological information.  Fact Sheet for Patients:   RoadLapTop.co.za  Fact Sheet for Healthcare Providers: http://kim-miller.com/  This test is not yet approved or  cleared by the [place] FDA and has been authorized for detection and/or diagnosis of SARS-CoV-2 by FDA under an Emergency Use Authorization (EUA).  This EUA will remain in effect (meaning this test can be used) for the duration of the COVID-19 declaration under Section 564(b)(1) of the Act, 21 U.S.C. section 360bbb-3(b)(1), unless the authorization is terminated or revoked sooner.  Performed at [facility], [address] [place], [place] [address]   Blood culture (routine x 2)      Status: None   Collection Time: 11/30/22 12:58 PM   Specimen: BLOOD  Result Value Ref Range Status   Specimen Description BLOOD BLOOD RIGHT HAND  Final   Special Requests   Final    BOTTLES DRAWN AEROBIC AND ANAEROBIC Blood Culture results may not be optimal due to an excessive volume of blood received in culture bottles   Culture   Final    NO GROWTH 5 DAYS Performed at [facility], [address], [place], [place] [address]    Report Status 12/05/2022 FINAL  Final  Blood culture (routine x 2)     Status: None   Collection Time: 11/30/22  1:06 PM   Specimen: BLOOD  Result Value Ref Range Status   Specimen Description BLOOD BLOOD RIGHT HAND  Final   Special Requests   Final    BOTTLES DRAWN AEROBIC AND ANAEROBIC Blood Culture adequate volume   Culture   Final    NO GROWTH 5 DAYS Performed at [facility], [address] [place], [place] [address]    Report Status 12/05/2022 FINAL  Final  MRSA Next Gen by PCR, Nasal     Status: None   Collection Time: 12/03/22  4:15 AM   Specimen: Nasal Mucosa; Nasal Swab  Result Value Ref Range Status   MRSA by PCR Next Gen NOT DETECTED NOT DETECTED Final    Comment: (NOTE) The GeneXpert MRSA Assay (FDA approved for NASAL specimens only), is one component of a comprehensive MRSA colonization surveillance program. It is not intended to diagnose MRSA infection nor to guide or monitor treatment for MRSA infections. Test performance is not FDA approved in patients less than 5 years old. Performed at [facility], [address] [place], [place] [address]     Labs: CBC: Recent Labs  Lab 12/28/22 1511 12/29/22 [phone] 12/30/22 0541  WBC 5.6 6.6 7.9  NEUTROABS 2.7  --   --   HGB 11.0* 10.1* 8.5*  HCT 35.7* 31.0* 25.8*  MCV 98.3 93.4 93.1  PLT 209 175 173   Basic Metabolic Panel: Recent Labs  Lab 12/28/22 1511 12/29/22 0706 12/30/22 0541  NA 138 139 135  K 4.0 4.0 4.1  CL 97* 100 98  CO2 33* 31 30   GLUCOSE 115* 95 134*  BUN 16 18 18   CREATININE 0.86 0.92 0.97  CALCIUM 9.1 8.8* 8.4*   Liver Function Tests: No results for input(s): "AST", "ALT", "ALKPHOS", "BILITOT", "PROT", "ALBUMIN" in the last 168 hours. CBG: No results for input(s): "GLUCAP" in the last 168 hours.  Discharge time spent: 38 minutes.  Signed: Marcelino Duster, MD [facility] 12/30/2022

## 2022-12-31 DIAGNOSIS — I10 Essential (primary) hypertension: Secondary | ICD-10-CM | POA: Diagnosis not present

## 2022-12-31 DIAGNOSIS — I5030 Unspecified diastolic (congestive) heart failure: Secondary | ICD-10-CM | POA: Diagnosis not present

## 2022-12-31 DIAGNOSIS — J441 Chronic obstructive pulmonary disease with (acute) exacerbation: Secondary | ICD-10-CM | POA: Diagnosis not present

## 2022-12-31 DIAGNOSIS — I4891 Unspecified atrial fibrillation: Secondary | ICD-10-CM | POA: Diagnosis not present

## 2022-12-31 DIAGNOSIS — K219 Gastro-esophageal reflux disease without esophagitis: Secondary | ICD-10-CM | POA: Diagnosis not present

## 2022-12-31 DIAGNOSIS — J9611 Chronic respiratory failure with hypoxia: Secondary | ICD-10-CM | POA: Diagnosis not present

## 2023-01-01 DIAGNOSIS — K219 Gastro-esophageal reflux disease without esophagitis: Secondary | ICD-10-CM | POA: Diagnosis not present

## 2023-01-01 DIAGNOSIS — J441 Chronic obstructive pulmonary disease with (acute) exacerbation: Secondary | ICD-10-CM | POA: Diagnosis not present

## 2023-01-01 DIAGNOSIS — J9611 Chronic respiratory failure with hypoxia: Secondary | ICD-10-CM | POA: Diagnosis not present

## 2023-01-01 DIAGNOSIS — I5030 Unspecified diastolic (congestive) heart failure: Secondary | ICD-10-CM | POA: Diagnosis not present

## 2023-01-01 DIAGNOSIS — I4891 Unspecified atrial fibrillation: Secondary | ICD-10-CM | POA: Diagnosis not present

## 2023-01-01 DIAGNOSIS — I10 Essential (primary) hypertension: Secondary | ICD-10-CM | POA: Diagnosis not present

## 2023-01-02 ENCOUNTER — Telehealth: Payer: Self-pay | Admitting: *Deleted

## 2023-01-02 DIAGNOSIS — I5030 Unspecified diastolic (congestive) heart failure: Secondary | ICD-10-CM | POA: Diagnosis not present

## 2023-01-02 DIAGNOSIS — K219 Gastro-esophageal reflux disease without esophagitis: Secondary | ICD-10-CM | POA: Diagnosis not present

## 2023-01-02 DIAGNOSIS — I10 Essential (primary) hypertension: Secondary | ICD-10-CM | POA: Diagnosis not present

## 2023-01-02 DIAGNOSIS — I4891 Unspecified atrial fibrillation: Secondary | ICD-10-CM | POA: Diagnosis not present

## 2023-01-02 DIAGNOSIS — J9611 Chronic respiratory failure with hypoxia: Secondary | ICD-10-CM | POA: Diagnosis not present

## 2023-01-02 DIAGNOSIS — J441 Chronic obstructive pulmonary disease with (acute) exacerbation: Secondary | ICD-10-CM | POA: Diagnosis not present

## 2023-01-02 NOTE — Transitions of Care (Post Inpatient/ED Visit) (Signed)
01/02/2023  Name: Kristi Clark MRN: [id number] DOB: 07-26-1950  Today's TOC FU Call Status: Today's TOC FU Call Status:: Unsuccessful Call (1st Attempt) Unsuccessful Call (1st Attempt) Date: 01/02/23  Attempted to reach the patient regarding the most recent Inpatient/ED visit.  Follow Up Plan: Additional outreach attempts will be made to reach the patient to complete the Transitions of Care (Post Inpatient/ED visit) call.  Patient lives with [place]. You need to call [name] for information.  Gean Maidens BSN RN [facility] Care Management [phone]

## 2023-01-03 ENCOUNTER — Telehealth: Payer: Self-pay

## 2023-01-03 DIAGNOSIS — K219 Gastro-esophageal reflux disease without esophagitis: Secondary | ICD-10-CM | POA: Diagnosis not present

## 2023-01-03 DIAGNOSIS — J9611 Chronic respiratory failure with hypoxia: Secondary | ICD-10-CM | POA: Diagnosis not present

## 2023-01-03 DIAGNOSIS — I4891 Unspecified atrial fibrillation: Secondary | ICD-10-CM | POA: Diagnosis not present

## 2023-01-03 DIAGNOSIS — I10 Essential (primary) hypertension: Secondary | ICD-10-CM | POA: Diagnosis not present

## 2023-01-03 DIAGNOSIS — I5030 Unspecified diastolic (congestive) heart failure: Secondary | ICD-10-CM | POA: Diagnosis not present

## 2023-01-03 DIAGNOSIS — J441 Chronic obstructive pulmonary disease with (acute) exacerbation: Secondary | ICD-10-CM | POA: Diagnosis not present

## 2023-01-03 NOTE — Transitions of Care (Post Inpatient/ED Visit) (Signed)
01/03/2023  Name: Kristi Clark MRN: [id number] DOB: 28-Mar-1950  Today's TOC FU Call Status: Today's TOC FU Call Status:: Successful TOC FU Call Completed TOC FU Call Complete Date: 01/03/23 Patient's Name and Date of Birth confirmed.  Transition Care Management Follow-up Telephone Call Date of Discharge: 12/30/22 Discharge Facility: [facility]) Type of Discharge: Inpatient Admission Primary Inpatient Discharge Diagnosis:: Displaced intertrochanteric fracture of right femur How have you been since you were released from the hospital?: Better Any questions or concerns?: No  Items Reviewed: Did you receive and understand the discharge instructions provided?: Yes Medications obtained,verified, and reconciled?: Yes (Medications Reviewed) Any new allergies since your discharge?: No Dietary orders reviewed?: Yes Type of Diet Ordered:: Reg Pleasure meals Do you have support at home?: Yes People in Home: child(ren), adult Name of Support/Comfort Primary Source: [name]  Medications Reviewed Today: Medications Reviewed Today     Reviewed by Johnnette Barrios, RN (Registered Nurse) on 01/03/23 at 1403  Med List Status: <None>   Medication Order Taking? Sig Documenting Provider Last Dose Status Informant  albuterol (PROVENTIL) (2.5 MG/3ML) 0.083% nebulizer solution [id number] Yes Take 3 mLs (2.5 mg total) by nebulization every 4 (four) hours as needed for wheezing or shortness of breath. Johnson, Megan P, DO Taking Active Child  albuterol (VENTOLIN HFA) 108 (90 Base) MCG/ACT inhaler [id number] Yes Inhale 2 puffs into the lungs every 6 (six) hours as needed for wheezing or shortness of breath. Olevia Perches P, DO Taking Active Child  Aloe-Sodium Chloride (AYR SALINE NASAL GEL NA) [id number] Yes Place 1 Application into the nose as needed (dry nares). [provider] Taking Active Child  ALPRAZolam (XANAX) 0.25 MG tablet [id number] Yes Take 1 tablet  (0.25 mg total) by mouth 2 (two) times daily as needed for anxiety. Tresa Moore, MD Taking Active            Med Note Sharon Seller, Devondre Guzzetta L   Tue Jan 03, 2023 12:22 PM) Takes 2 x day routinely  amiodarone (PACERONE) 200 MG tablet [id number] Yes Take 200 mg by mouth daily. [provider] Taking Active Family Member  aspirin 81 MG chewable tablet [id number] Yes Chew 1 tablet (81 mg total) by mouth 2 (two) times daily. Marcelino Duster, MD Taking Active   Blood Pressure Monitoring (BLOOD PRESSURE MONITOR/WRIST) KIT [id number] Yes Take Blood pressure as needed, Dx: I12.9 Dorcas Carrow, DO Taking Active Child           Med Note Sharon Seller, Shante Maysonet L   Tue Jan 03, 2023 12:22 PM) Checks as needed for sypmtoms  feeding supplement (ENSURE ENLIVE / ENSURE PLUS) LIQD [id number] Yes Take 237 mLs by mouth 2 (two) times daily between meals. Marcelino Duster, MD Taking Active   fluticasone (FLONASE) 50 MCG/ACT nasal spray [id number] No Place 2 sprays into both nostrils daily.  Patient not taking: Reported on 01/03/2023   Mecum, Oswaldo Conroy, PA-C Not Taking Active Self  Fluticasone-Umeclidin-Vilant (TRELEGY ELLIPTA) 100-62.5-25 MCG/ACT AEPB [id number] Yes 100 mcg DAILY (route: inhalation) Dorcas Carrow, DO Taking Active Child  furosemide (LASIX) 20 MG tablet [id number] Yes AS needed Laurier Nancy, MD Taking Active Child           Med Note Sharon Seller, Kamira Mellette L   Tue Jan 03, 2023 12:24 PM) As needed for lower leg swelling , weight gain 3 lbs ON  guaiFENesin (MUCINEX) 600 MG 12 hr tablet [id number] No Take 1 tablet (600 mg total) by mouth 2 (two) times daily.  Patient not taking: Reported on 01/03/2023   Tresa Moore, MD Not Taking Active   isosorbide mononitrate (IMDUR) 60 MG 24 hr tablet [id number] Yes Take 1 tablet (60 mg total) by mouth 2 (two) times daily.  Patient taking differently: Take 60 mg by mouth daily.   Olevia Perches P, DO Taking Active Child  methocarbamol (ROBAXIN) 500 MG  tablet [id number] Yes Take 1 tablet (500 mg total) by mouth every 8 (eight) hours as needed for muscle spasms. Marcelino Duster, MD Taking Active   metoprolol succinate (TOPROL-XL) 50 MG 24 hr tablet [id number] Yes Take 1 tablet (50 mg total) by mouth daily. Take with or immediately following a meal. Laural Benes, Megan P, DO Taking Active Child  mirtazapine (REMERON) 45 MG tablet [id number] Yes Take 1 tablet (45 mg total) by mouth at bedtime. Olevia Perches P, DO Taking Active Child  oxyCODONE (OXY IR/ROXICODONE) 5 MG immediate release tablet [id number] Yes Take 1-2 tablets (5-10 mg total) by mouth every 4 (four) hours as needed for moderate pain or severe pain. Tresa Moore, MD Taking Active   pantoprazole (PROTONIX) 40 MG tablet [id number] Yes Take 1 tablet (40 mg total) by mouth 2 (two) times daily. Olevia Perches P, DO Taking Active Child  phenazopyridine (PYRIDIUM) 100 MG tablet [id number] No Take 1 tablet (100 mg total) by mouth 3 (three) times daily with meals.  Patient not taking: Reported on 01/03/2023   Tresa Moore, MD Not Taking Active   polyethylene glycol powder (GLYCOLAX/MIRALAX) 17 GM/SCOOP powder [id number] No Take 17 g by mouth 3 (three) times daily.  Patient not taking: Reported on 01/03/2023   Dorcas Carrow, DO Not Taking Active Child, Self  QUEtiapine (SEROQUEL XR) 50 MG TB24 24 hr tablet [id number] Yes Take 2 tablets (100 mg total) by mouth at bedtime. Olevia Perches P, DO Taking Active Child  senna-docusate (SENOKOT-S) 8.6-50 MG tablet [id number] Yes Take 1 tablet by mouth daily. [provider] Taking Active Family Member  tamsulosin (FLOMAX) 0.4 MG CAPS capsule [id number] Yes Take 1 capsule (0.4 mg total) by mouth daily. Tresa Moore, MD Taking Active             Home Care and Equipment/Supplies: Were Home Health Services Ordered?: Yes Name of Home Health Agency:: [facility] Has Agency set up a time to come to your home?:  Yes First Home Health Visit Date: 01/03/23 Any new equipment or medical supplies ordered?: No  Functional Questionnaire: Do you need assistance with bathing/showering or dressing?: Yes Do you need assistance with meal preparation?: Yes Do you need assistance with eating?: No Do you have difficulty maintaining continence: Yes (pull-ups) Do you have difficulty managing or taking your medications?: Yes (Daughter sets up)  Follow up appointments reviewed: PCP Follow-up appointment confirmed?: NA Specialist Hospital Follow-up appointment confirmed?: NA Do you need transportation to your follow-up appointment?: No Do you understand care options if your condition(s) worsen?: Yes-patient verbalized understanding Discussed [facility]  TOC program and weekly calls to patient to assess condition/status, medication management  and provide support/education as indicated . Patient/ Caregiver voiced understanding and declined enrollment in the 30-day TOC Program.   She is current patient and followed by [facility]. She has PT/Nsg at home Daughter is strong support. No additional needs at this time     The patient has been provided with contact information for the care management team and has been advised to call with any health related questions or concerns.  Susa Loffler , BSN, RN Care Management Coordinator [place]   [facility] [email or web address] Direct Dial: [phone]

## 2023-01-04 DIAGNOSIS — I4891 Unspecified atrial fibrillation: Secondary | ICD-10-CM | POA: Diagnosis not present

## 2023-01-04 DIAGNOSIS — J441 Chronic obstructive pulmonary disease with (acute) exacerbation: Secondary | ICD-10-CM | POA: Diagnosis not present

## 2023-01-04 DIAGNOSIS — K219 Gastro-esophageal reflux disease without esophagitis: Secondary | ICD-10-CM | POA: Diagnosis not present

## 2023-01-04 DIAGNOSIS — J9611 Chronic respiratory failure with hypoxia: Secondary | ICD-10-CM | POA: Diagnosis not present

## 2023-01-04 DIAGNOSIS — I10 Essential (primary) hypertension: Secondary | ICD-10-CM | POA: Diagnosis not present

## 2023-01-04 DIAGNOSIS — I5030 Unspecified diastolic (congestive) heart failure: Secondary | ICD-10-CM | POA: Diagnosis not present

## 2023-01-05 DIAGNOSIS — I5030 Unspecified diastolic (congestive) heart failure: Secondary | ICD-10-CM | POA: Diagnosis not present

## 2023-01-05 DIAGNOSIS — I4891 Unspecified atrial fibrillation: Secondary | ICD-10-CM | POA: Diagnosis not present

## 2023-01-05 DIAGNOSIS — K219 Gastro-esophageal reflux disease without esophagitis: Secondary | ICD-10-CM | POA: Diagnosis not present

## 2023-01-05 DIAGNOSIS — I10 Essential (primary) hypertension: Secondary | ICD-10-CM | POA: Diagnosis not present

## 2023-01-05 DIAGNOSIS — J441 Chronic obstructive pulmonary disease with (acute) exacerbation: Secondary | ICD-10-CM | POA: Diagnosis not present

## 2023-01-05 DIAGNOSIS — J9611 Chronic respiratory failure with hypoxia: Secondary | ICD-10-CM | POA: Diagnosis not present

## 2023-01-06 DIAGNOSIS — K219 Gastro-esophageal reflux disease without esophagitis: Secondary | ICD-10-CM | POA: Diagnosis not present

## 2023-01-06 DIAGNOSIS — I4891 Unspecified atrial fibrillation: Secondary | ICD-10-CM | POA: Diagnosis not present

## 2023-01-06 DIAGNOSIS — J441 Chronic obstructive pulmonary disease with (acute) exacerbation: Secondary | ICD-10-CM | POA: Diagnosis not present

## 2023-01-06 DIAGNOSIS — E7849 Other hyperlipidemia: Secondary | ICD-10-CM | POA: Diagnosis not present

## 2023-01-06 DIAGNOSIS — J9611 Chronic respiratory failure with hypoxia: Secondary | ICD-10-CM | POA: Diagnosis not present

## 2023-01-06 DIAGNOSIS — J309 Allergic rhinitis, unspecified: Secondary | ICD-10-CM | POA: Diagnosis not present

## 2023-01-06 DIAGNOSIS — D649 Anemia, unspecified: Secondary | ICD-10-CM | POA: Diagnosis not present

## 2023-01-06 DIAGNOSIS — F419 Anxiety disorder, unspecified: Secondary | ICD-10-CM | POA: Diagnosis not present

## 2023-01-06 DIAGNOSIS — I2510 Atherosclerotic heart disease of native coronary artery without angina pectoris: Secondary | ICD-10-CM | POA: Diagnosis not present

## 2023-01-06 DIAGNOSIS — F32A Depression, unspecified: Secondary | ICD-10-CM | POA: Diagnosis not present

## 2023-01-06 DIAGNOSIS — I10 Essential (primary) hypertension: Secondary | ICD-10-CM | POA: Diagnosis not present

## 2023-01-06 DIAGNOSIS — I5030 Unspecified diastolic (congestive) heart failure: Secondary | ICD-10-CM | POA: Diagnosis not present

## 2023-01-07 DIAGNOSIS — I4891 Unspecified atrial fibrillation: Secondary | ICD-10-CM | POA: Diagnosis not present

## 2023-01-07 DIAGNOSIS — I5030 Unspecified diastolic (congestive) heart failure: Secondary | ICD-10-CM | POA: Diagnosis not present

## 2023-01-07 DIAGNOSIS — K219 Gastro-esophageal reflux disease without esophagitis: Secondary | ICD-10-CM | POA: Diagnosis not present

## 2023-01-07 DIAGNOSIS — J441 Chronic obstructive pulmonary disease with (acute) exacerbation: Secondary | ICD-10-CM | POA: Diagnosis not present

## 2023-01-07 DIAGNOSIS — J9611 Chronic respiratory failure with hypoxia: Secondary | ICD-10-CM | POA: Diagnosis not present

## 2023-01-07 DIAGNOSIS — I10 Essential (primary) hypertension: Secondary | ICD-10-CM | POA: Diagnosis not present

## 2023-01-08 DIAGNOSIS — I5030 Unspecified diastolic (congestive) heart failure: Secondary | ICD-10-CM | POA: Diagnosis not present

## 2023-01-08 DIAGNOSIS — I10 Essential (primary) hypertension: Secondary | ICD-10-CM | POA: Diagnosis not present

## 2023-01-08 DIAGNOSIS — I4891 Unspecified atrial fibrillation: Secondary | ICD-10-CM | POA: Diagnosis not present

## 2023-01-08 DIAGNOSIS — J9611 Chronic respiratory failure with hypoxia: Secondary | ICD-10-CM | POA: Diagnosis not present

## 2023-01-08 DIAGNOSIS — J441 Chronic obstructive pulmonary disease with (acute) exacerbation: Secondary | ICD-10-CM | POA: Diagnosis not present

## 2023-01-08 DIAGNOSIS — K219 Gastro-esophageal reflux disease without esophagitis: Secondary | ICD-10-CM | POA: Diagnosis not present

## 2023-01-09 ENCOUNTER — Telehealth: Payer: Self-pay | Admitting: Orthopedic Surgery

## 2023-01-09 DIAGNOSIS — K219 Gastro-esophageal reflux disease without esophagitis: Secondary | ICD-10-CM | POA: Diagnosis not present

## 2023-01-09 DIAGNOSIS — I5030 Unspecified diastolic (congestive) heart failure: Secondary | ICD-10-CM | POA: Diagnosis not present

## 2023-01-09 DIAGNOSIS — I4891 Unspecified atrial fibrillation: Secondary | ICD-10-CM | POA: Diagnosis not present

## 2023-01-09 DIAGNOSIS — J9611 Chronic respiratory failure with hypoxia: Secondary | ICD-10-CM | POA: Diagnosis not present

## 2023-01-09 DIAGNOSIS — I10 Essential (primary) hypertension: Secondary | ICD-10-CM | POA: Diagnosis not present

## 2023-01-09 DIAGNOSIS — J441 Chronic obstructive pulmonary disease with (acute) exacerbation: Secondary | ICD-10-CM | POA: Diagnosis not present

## 2023-01-09 NOTE — Telephone Encounter (Signed)
Per op note : Dressing can be reinforced as needed, will change on POD#2/3 if needed.  Patient does not need to remain hospitalized for dressing change Follow up plan: approximately 2 weeks postop for incision check and XR.  If the patient will be returning to a nursing facility, sutures can be trimmed around this time and a follow up appointment can be scheduled for 6 weeks after surgery.  I called Kristi Clark and told her to go ahead and trim the sutures at 2 weeks form surgery date, and follow up here at 6 weeks mark, Kristi Clark states she may not make it that long but if she does she will arrange for her to come in. I also advised ok to shower at 2 week mark and ok to sponge bathe now Avoid incision until the sutures trimmed, she voiced understanding .  To you FYI only   If anything else needed she will let us know

## 2023-01-09 NOTE — Telephone Encounter (Signed)
Dr. Dallas Schimke pt - Kristi Doyne Keel, RN w/[facility] [phone] lvm wanting to get some instructions for her surgical wound, incision is on her right hip.  She is under Hospice care and has been very weak and not able to get up to an appointment.  She took the dressing off, site looks good, incision looks well, just a little dry blood, steri strips are intact.  Not sure if she can get in the shower or not.  She wants to know if there is anything she needs avoid at home such as a shower or does she need to put another dressing on it to keep it dry while she showers.

## 2023-01-11 DIAGNOSIS — I5030 Unspecified diastolic (congestive) heart failure: Secondary | ICD-10-CM | POA: Diagnosis not present

## 2023-01-11 DIAGNOSIS — J9611 Chronic respiratory failure with hypoxia: Secondary | ICD-10-CM | POA: Diagnosis not present

## 2023-01-11 DIAGNOSIS — I10 Essential (primary) hypertension: Secondary | ICD-10-CM | POA: Diagnosis not present

## 2023-01-11 DIAGNOSIS — K219 Gastro-esophageal reflux disease without esophagitis: Secondary | ICD-10-CM | POA: Diagnosis not present

## 2023-01-11 DIAGNOSIS — J441 Chronic obstructive pulmonary disease with (acute) exacerbation: Secondary | ICD-10-CM | POA: Diagnosis not present

## 2023-01-11 DIAGNOSIS — I4891 Unspecified atrial fibrillation: Secondary | ICD-10-CM | POA: Diagnosis not present

## 2023-01-12 DIAGNOSIS — J9611 Chronic respiratory failure with hypoxia: Secondary | ICD-10-CM | POA: Diagnosis not present

## 2023-01-12 DIAGNOSIS — I10 Essential (primary) hypertension: Secondary | ICD-10-CM | POA: Diagnosis not present

## 2023-01-12 DIAGNOSIS — K219 Gastro-esophageal reflux disease without esophagitis: Secondary | ICD-10-CM | POA: Diagnosis not present

## 2023-01-12 DIAGNOSIS — J441 Chronic obstructive pulmonary disease with (acute) exacerbation: Secondary | ICD-10-CM | POA: Diagnosis not present

## 2023-01-12 DIAGNOSIS — I5030 Unspecified diastolic (congestive) heart failure: Secondary | ICD-10-CM | POA: Diagnosis not present

## 2023-01-12 DIAGNOSIS — I4891 Unspecified atrial fibrillation: Secondary | ICD-10-CM | POA: Diagnosis not present

## 2023-01-13 DIAGNOSIS — I5030 Unspecified diastolic (congestive) heart failure: Secondary | ICD-10-CM | POA: Diagnosis not present

## 2023-01-13 DIAGNOSIS — K219 Gastro-esophageal reflux disease without esophagitis: Secondary | ICD-10-CM | POA: Diagnosis not present

## 2023-01-13 DIAGNOSIS — J441 Chronic obstructive pulmonary disease with (acute) exacerbation: Secondary | ICD-10-CM | POA: Diagnosis not present

## 2023-01-13 DIAGNOSIS — J9611 Chronic respiratory failure with hypoxia: Secondary | ICD-10-CM | POA: Diagnosis not present

## 2023-01-13 DIAGNOSIS — I10 Essential (primary) hypertension: Secondary | ICD-10-CM | POA: Diagnosis not present

## 2023-01-13 DIAGNOSIS — I4891 Unspecified atrial fibrillation: Secondary | ICD-10-CM | POA: Diagnosis not present

## 2023-01-17 DIAGNOSIS — K219 Gastro-esophageal reflux disease without esophagitis: Secondary | ICD-10-CM | POA: Diagnosis not present

## 2023-01-17 DIAGNOSIS — I10 Essential (primary) hypertension: Secondary | ICD-10-CM | POA: Diagnosis not present

## 2023-01-17 DIAGNOSIS — I4891 Unspecified atrial fibrillation: Secondary | ICD-10-CM | POA: Diagnosis not present

## 2023-01-17 DIAGNOSIS — J441 Chronic obstructive pulmonary disease with (acute) exacerbation: Secondary | ICD-10-CM | POA: Diagnosis not present

## 2023-01-17 DIAGNOSIS — J9611 Chronic respiratory failure with hypoxia: Secondary | ICD-10-CM | POA: Diagnosis not present

## 2023-01-17 DIAGNOSIS — I5030 Unspecified diastolic (congestive) heart failure: Secondary | ICD-10-CM | POA: Diagnosis not present

## 2023-01-18 DIAGNOSIS — I10 Essential (primary) hypertension: Secondary | ICD-10-CM | POA: Diagnosis not present

## 2023-01-18 DIAGNOSIS — I4891 Unspecified atrial fibrillation: Secondary | ICD-10-CM | POA: Diagnosis not present

## 2023-01-18 DIAGNOSIS — J441 Chronic obstructive pulmonary disease with (acute) exacerbation: Secondary | ICD-10-CM | POA: Diagnosis not present

## 2023-01-18 DIAGNOSIS — I5030 Unspecified diastolic (congestive) heart failure: Secondary | ICD-10-CM | POA: Diagnosis not present

## 2023-01-18 DIAGNOSIS — K219 Gastro-esophageal reflux disease without esophagitis: Secondary | ICD-10-CM | POA: Diagnosis not present

## 2023-01-18 DIAGNOSIS — J9611 Chronic respiratory failure with hypoxia: Secondary | ICD-10-CM | POA: Diagnosis not present

## 2023-01-19 DIAGNOSIS — I5030 Unspecified diastolic (congestive) heart failure: Secondary | ICD-10-CM | POA: Diagnosis not present

## 2023-01-19 DIAGNOSIS — J441 Chronic obstructive pulmonary disease with (acute) exacerbation: Secondary | ICD-10-CM | POA: Diagnosis not present

## 2023-01-19 DIAGNOSIS — I4891 Unspecified atrial fibrillation: Secondary | ICD-10-CM | POA: Diagnosis not present

## 2023-01-19 DIAGNOSIS — I10 Essential (primary) hypertension: Secondary | ICD-10-CM | POA: Diagnosis not present

## 2023-01-19 DIAGNOSIS — J9611 Chronic respiratory failure with hypoxia: Secondary | ICD-10-CM | POA: Diagnosis not present

## 2023-01-19 DIAGNOSIS — K219 Gastro-esophageal reflux disease without esophagitis: Secondary | ICD-10-CM | POA: Diagnosis not present

## 2023-01-20 DIAGNOSIS — J441 Chronic obstructive pulmonary disease with (acute) exacerbation: Secondary | ICD-10-CM | POA: Diagnosis not present

## 2023-01-20 DIAGNOSIS — J9611 Chronic respiratory failure with hypoxia: Secondary | ICD-10-CM | POA: Diagnosis not present

## 2023-01-20 DIAGNOSIS — I10 Essential (primary) hypertension: Secondary | ICD-10-CM | POA: Diagnosis not present

## 2023-01-20 DIAGNOSIS — I5030 Unspecified diastolic (congestive) heart failure: Secondary | ICD-10-CM | POA: Diagnosis not present

## 2023-01-20 DIAGNOSIS — I4891 Unspecified atrial fibrillation: Secondary | ICD-10-CM | POA: Diagnosis not present

## 2023-01-20 DIAGNOSIS — K219 Gastro-esophageal reflux disease without esophagitis: Secondary | ICD-10-CM | POA: Diagnosis not present

## 2023-01-25 DIAGNOSIS — K219 Gastro-esophageal reflux disease without esophagitis: Secondary | ICD-10-CM | POA: Diagnosis not present

## 2023-01-25 DIAGNOSIS — J441 Chronic obstructive pulmonary disease with (acute) exacerbation: Secondary | ICD-10-CM | POA: Diagnosis not present

## 2023-01-25 DIAGNOSIS — J9611 Chronic respiratory failure with hypoxia: Secondary | ICD-10-CM | POA: Diagnosis not present

## 2023-01-25 DIAGNOSIS — I5030 Unspecified diastolic (congestive) heart failure: Secondary | ICD-10-CM | POA: Diagnosis not present

## 2023-01-25 DIAGNOSIS — I10 Essential (primary) hypertension: Secondary | ICD-10-CM | POA: Diagnosis not present

## 2023-01-25 DIAGNOSIS — I4891 Unspecified atrial fibrillation: Secondary | ICD-10-CM | POA: Diagnosis not present

## 2023-01-26 DIAGNOSIS — J9611 Chronic respiratory failure with hypoxia: Secondary | ICD-10-CM | POA: Diagnosis not present

## 2023-01-26 DIAGNOSIS — J441 Chronic obstructive pulmonary disease with (acute) exacerbation: Secondary | ICD-10-CM | POA: Diagnosis not present

## 2023-01-26 DIAGNOSIS — K219 Gastro-esophageal reflux disease without esophagitis: Secondary | ICD-10-CM | POA: Diagnosis not present

## 2023-01-26 DIAGNOSIS — I5030 Unspecified diastolic (congestive) heart failure: Secondary | ICD-10-CM | POA: Diagnosis not present

## 2023-01-26 DIAGNOSIS — I4891 Unspecified atrial fibrillation: Secondary | ICD-10-CM | POA: Diagnosis not present

## 2023-01-26 DIAGNOSIS — I10 Essential (primary) hypertension: Secondary | ICD-10-CM | POA: Diagnosis not present

## 2023-01-27 DIAGNOSIS — I10 Essential (primary) hypertension: Secondary | ICD-10-CM | POA: Diagnosis not present

## 2023-01-27 DIAGNOSIS — K219 Gastro-esophageal reflux disease without esophagitis: Secondary | ICD-10-CM | POA: Diagnosis not present

## 2023-01-27 DIAGNOSIS — J9611 Chronic respiratory failure with hypoxia: Secondary | ICD-10-CM | POA: Diagnosis not present

## 2023-01-27 DIAGNOSIS — I4891 Unspecified atrial fibrillation: Secondary | ICD-10-CM | POA: Diagnosis not present

## 2023-01-27 DIAGNOSIS — I5030 Unspecified diastolic (congestive) heart failure: Secondary | ICD-10-CM | POA: Diagnosis not present

## 2023-01-27 DIAGNOSIS — J441 Chronic obstructive pulmonary disease with (acute) exacerbation: Secondary | ICD-10-CM | POA: Diagnosis not present

## 2023-01-31 DIAGNOSIS — J9611 Chronic respiratory failure with hypoxia: Secondary | ICD-10-CM | POA: Diagnosis not present

## 2023-01-31 DIAGNOSIS — I4891 Unspecified atrial fibrillation: Secondary | ICD-10-CM | POA: Diagnosis not present

## 2023-01-31 DIAGNOSIS — I10 Essential (primary) hypertension: Secondary | ICD-10-CM | POA: Diagnosis not present

## 2023-01-31 DIAGNOSIS — I5030 Unspecified diastolic (congestive) heart failure: Secondary | ICD-10-CM | POA: Diagnosis not present

## 2023-01-31 DIAGNOSIS — K219 Gastro-esophageal reflux disease without esophagitis: Secondary | ICD-10-CM | POA: Diagnosis not present

## 2023-01-31 DIAGNOSIS — J441 Chronic obstructive pulmonary disease with (acute) exacerbation: Secondary | ICD-10-CM | POA: Diagnosis not present

## 2023-02-01 DIAGNOSIS — I10 Essential (primary) hypertension: Secondary | ICD-10-CM | POA: Diagnosis not present

## 2023-02-01 DIAGNOSIS — J441 Chronic obstructive pulmonary disease with (acute) exacerbation: Secondary | ICD-10-CM | POA: Diagnosis not present

## 2023-02-01 DIAGNOSIS — I5030 Unspecified diastolic (congestive) heart failure: Secondary | ICD-10-CM | POA: Diagnosis not present

## 2023-02-01 DIAGNOSIS — J9611 Chronic respiratory failure with hypoxia: Secondary | ICD-10-CM | POA: Diagnosis not present

## 2023-02-01 DIAGNOSIS — K219 Gastro-esophageal reflux disease without esophagitis: Secondary | ICD-10-CM | POA: Diagnosis not present

## 2023-02-01 DIAGNOSIS — I4891 Unspecified atrial fibrillation: Secondary | ICD-10-CM | POA: Diagnosis not present

## 2023-02-03 DIAGNOSIS — I5030 Unspecified diastolic (congestive) heart failure: Secondary | ICD-10-CM | POA: Diagnosis not present

## 2023-02-03 DIAGNOSIS — J9611 Chronic respiratory failure with hypoxia: Secondary | ICD-10-CM | POA: Diagnosis not present

## 2023-02-03 DIAGNOSIS — J441 Chronic obstructive pulmonary disease with (acute) exacerbation: Secondary | ICD-10-CM | POA: Diagnosis not present

## 2023-02-03 DIAGNOSIS — K219 Gastro-esophageal reflux disease without esophagitis: Secondary | ICD-10-CM | POA: Diagnosis not present

## 2023-02-03 DIAGNOSIS — I10 Essential (primary) hypertension: Secondary | ICD-10-CM | POA: Diagnosis not present

## 2023-02-03 DIAGNOSIS — I4891 Unspecified atrial fibrillation: Secondary | ICD-10-CM | POA: Diagnosis not present

## 2023-02-05 DIAGNOSIS — I5030 Unspecified diastolic (congestive) heart failure: Secondary | ICD-10-CM | POA: Diagnosis not present

## 2023-02-05 DIAGNOSIS — K219 Gastro-esophageal reflux disease without esophagitis: Secondary | ICD-10-CM | POA: Diagnosis not present

## 2023-02-05 DIAGNOSIS — J309 Allergic rhinitis, unspecified: Secondary | ICD-10-CM | POA: Diagnosis not present

## 2023-02-05 DIAGNOSIS — J9611 Chronic respiratory failure with hypoxia: Secondary | ICD-10-CM | POA: Diagnosis not present

## 2023-02-05 DIAGNOSIS — F419 Anxiety disorder, unspecified: Secondary | ICD-10-CM | POA: Diagnosis not present

## 2023-02-05 DIAGNOSIS — I4891 Unspecified atrial fibrillation: Secondary | ICD-10-CM | POA: Diagnosis not present

## 2023-02-05 DIAGNOSIS — J441 Chronic obstructive pulmonary disease with (acute) exacerbation: Secondary | ICD-10-CM | POA: Diagnosis not present

## 2023-02-05 DIAGNOSIS — D649 Anemia, unspecified: Secondary | ICD-10-CM | POA: Diagnosis not present

## 2023-02-05 DIAGNOSIS — F32A Depression, unspecified: Secondary | ICD-10-CM | POA: Diagnosis not present

## 2023-02-05 DIAGNOSIS — I2510 Atherosclerotic heart disease of native coronary artery without angina pectoris: Secondary | ICD-10-CM | POA: Diagnosis not present

## 2023-02-05 DIAGNOSIS — I10 Essential (primary) hypertension: Secondary | ICD-10-CM | POA: Diagnosis not present

## 2023-02-05 DIAGNOSIS — E7849 Other hyperlipidemia: Secondary | ICD-10-CM | POA: Diagnosis not present

## 2023-02-07 DIAGNOSIS — I4891 Unspecified atrial fibrillation: Secondary | ICD-10-CM | POA: Diagnosis not present

## 2023-02-07 DIAGNOSIS — K219 Gastro-esophageal reflux disease without esophagitis: Secondary | ICD-10-CM | POA: Diagnosis not present

## 2023-02-07 DIAGNOSIS — I5030 Unspecified diastolic (congestive) heart failure: Secondary | ICD-10-CM | POA: Diagnosis not present

## 2023-02-07 DIAGNOSIS — J441 Chronic obstructive pulmonary disease with (acute) exacerbation: Secondary | ICD-10-CM | POA: Diagnosis not present

## 2023-02-07 DIAGNOSIS — I10 Essential (primary) hypertension: Secondary | ICD-10-CM | POA: Diagnosis not present

## 2023-02-07 DIAGNOSIS — J9611 Chronic respiratory failure with hypoxia: Secondary | ICD-10-CM | POA: Diagnosis not present

## 2023-02-08 DIAGNOSIS — I5030 Unspecified diastolic (congestive) heart failure: Secondary | ICD-10-CM | POA: Diagnosis not present

## 2023-02-08 DIAGNOSIS — I4891 Unspecified atrial fibrillation: Secondary | ICD-10-CM | POA: Diagnosis not present

## 2023-02-08 DIAGNOSIS — J9611 Chronic respiratory failure with hypoxia: Secondary | ICD-10-CM | POA: Diagnosis not present

## 2023-02-08 DIAGNOSIS — K219 Gastro-esophageal reflux disease without esophagitis: Secondary | ICD-10-CM | POA: Diagnosis not present

## 2023-02-08 DIAGNOSIS — I10 Essential (primary) hypertension: Secondary | ICD-10-CM | POA: Diagnosis not present

## 2023-02-08 DIAGNOSIS — J441 Chronic obstructive pulmonary disease with (acute) exacerbation: Secondary | ICD-10-CM | POA: Diagnosis not present

## 2023-02-09 DIAGNOSIS — I4891 Unspecified atrial fibrillation: Secondary | ICD-10-CM | POA: Diagnosis not present

## 2023-02-09 DIAGNOSIS — J441 Chronic obstructive pulmonary disease with (acute) exacerbation: Secondary | ICD-10-CM | POA: Diagnosis not present

## 2023-02-09 DIAGNOSIS — J9611 Chronic respiratory failure with hypoxia: Secondary | ICD-10-CM | POA: Diagnosis not present

## 2023-02-09 DIAGNOSIS — I5030 Unspecified diastolic (congestive) heart failure: Secondary | ICD-10-CM | POA: Diagnosis not present

## 2023-02-09 DIAGNOSIS — K219 Gastro-esophageal reflux disease without esophagitis: Secondary | ICD-10-CM | POA: Diagnosis not present

## 2023-02-09 DIAGNOSIS — I10 Essential (primary) hypertension: Secondary | ICD-10-CM | POA: Diagnosis not present

## 2023-02-10 DIAGNOSIS — J441 Chronic obstructive pulmonary disease with (acute) exacerbation: Secondary | ICD-10-CM | POA: Diagnosis not present

## 2023-02-10 DIAGNOSIS — J9611 Chronic respiratory failure with hypoxia: Secondary | ICD-10-CM | POA: Diagnosis not present

## 2023-02-10 DIAGNOSIS — I10 Essential (primary) hypertension: Secondary | ICD-10-CM | POA: Diagnosis not present

## 2023-02-10 DIAGNOSIS — I5030 Unspecified diastolic (congestive) heart failure: Secondary | ICD-10-CM | POA: Diagnosis not present

## 2023-02-10 DIAGNOSIS — K219 Gastro-esophageal reflux disease without esophagitis: Secondary | ICD-10-CM | POA: Diagnosis not present

## 2023-02-10 DIAGNOSIS — I4891 Unspecified atrial fibrillation: Secondary | ICD-10-CM | POA: Diagnosis not present

## 2023-02-14 DIAGNOSIS — I5030 Unspecified diastolic (congestive) heart failure: Secondary | ICD-10-CM | POA: Diagnosis not present

## 2023-02-14 DIAGNOSIS — I10 Essential (primary) hypertension: Secondary | ICD-10-CM | POA: Diagnosis not present

## 2023-02-14 DIAGNOSIS — K219 Gastro-esophageal reflux disease without esophagitis: Secondary | ICD-10-CM | POA: Diagnosis not present

## 2023-02-14 DIAGNOSIS — I4891 Unspecified atrial fibrillation: Secondary | ICD-10-CM | POA: Diagnosis not present

## 2023-02-14 DIAGNOSIS — J441 Chronic obstructive pulmonary disease with (acute) exacerbation: Secondary | ICD-10-CM | POA: Diagnosis not present

## 2023-02-14 DIAGNOSIS — J9611 Chronic respiratory failure with hypoxia: Secondary | ICD-10-CM | POA: Diagnosis not present

## 2023-02-15 DIAGNOSIS — I10 Essential (primary) hypertension: Secondary | ICD-10-CM | POA: Diagnosis not present

## 2023-02-15 DIAGNOSIS — I4891 Unspecified atrial fibrillation: Secondary | ICD-10-CM | POA: Diagnosis not present

## 2023-02-15 DIAGNOSIS — J9611 Chronic respiratory failure with hypoxia: Secondary | ICD-10-CM | POA: Diagnosis not present

## 2023-02-15 DIAGNOSIS — J441 Chronic obstructive pulmonary disease with (acute) exacerbation: Secondary | ICD-10-CM | POA: Diagnosis not present

## 2023-02-15 DIAGNOSIS — K219 Gastro-esophageal reflux disease without esophagitis: Secondary | ICD-10-CM | POA: Diagnosis not present

## 2023-02-15 DIAGNOSIS — I5030 Unspecified diastolic (congestive) heart failure: Secondary | ICD-10-CM | POA: Diagnosis not present

## 2023-02-17 DIAGNOSIS — I4891 Unspecified atrial fibrillation: Secondary | ICD-10-CM | POA: Diagnosis not present

## 2023-02-17 DIAGNOSIS — I10 Essential (primary) hypertension: Secondary | ICD-10-CM | POA: Diagnosis not present

## 2023-02-17 DIAGNOSIS — J9611 Chronic respiratory failure with hypoxia: Secondary | ICD-10-CM | POA: Diagnosis not present

## 2023-02-17 DIAGNOSIS — I5030 Unspecified diastolic (congestive) heart failure: Secondary | ICD-10-CM | POA: Diagnosis not present

## 2023-02-17 DIAGNOSIS — J441 Chronic obstructive pulmonary disease with (acute) exacerbation: Secondary | ICD-10-CM | POA: Diagnosis not present

## 2023-02-17 DIAGNOSIS — K219 Gastro-esophageal reflux disease without esophagitis: Secondary | ICD-10-CM | POA: Diagnosis not present

## 2023-02-20 DIAGNOSIS — J441 Chronic obstructive pulmonary disease with (acute) exacerbation: Secondary | ICD-10-CM | POA: Diagnosis not present

## 2023-02-20 DIAGNOSIS — I10 Essential (primary) hypertension: Secondary | ICD-10-CM | POA: Diagnosis not present

## 2023-02-20 DIAGNOSIS — J9611 Chronic respiratory failure with hypoxia: Secondary | ICD-10-CM | POA: Diagnosis not present

## 2023-02-20 DIAGNOSIS — I5030 Unspecified diastolic (congestive) heart failure: Secondary | ICD-10-CM | POA: Diagnosis not present

## 2023-02-20 DIAGNOSIS — I4891 Unspecified atrial fibrillation: Secondary | ICD-10-CM | POA: Diagnosis not present

## 2023-02-20 DIAGNOSIS — K219 Gastro-esophageal reflux disease without esophagitis: Secondary | ICD-10-CM | POA: Diagnosis not present

## 2023-02-21 DIAGNOSIS — I10 Essential (primary) hypertension: Secondary | ICD-10-CM | POA: Diagnosis not present

## 2023-02-21 DIAGNOSIS — I4891 Unspecified atrial fibrillation: Secondary | ICD-10-CM | POA: Diagnosis not present

## 2023-02-21 DIAGNOSIS — I5030 Unspecified diastolic (congestive) heart failure: Secondary | ICD-10-CM | POA: Diagnosis not present

## 2023-02-21 DIAGNOSIS — K219 Gastro-esophageal reflux disease without esophagitis: Secondary | ICD-10-CM | POA: Diagnosis not present

## 2023-02-21 DIAGNOSIS — J441 Chronic obstructive pulmonary disease with (acute) exacerbation: Secondary | ICD-10-CM | POA: Diagnosis not present

## 2023-02-21 DIAGNOSIS — J9611 Chronic respiratory failure with hypoxia: Secondary | ICD-10-CM | POA: Diagnosis not present

## 2023-02-22 DIAGNOSIS — J9611 Chronic respiratory failure with hypoxia: Secondary | ICD-10-CM | POA: Diagnosis not present

## 2023-02-22 DIAGNOSIS — I10 Essential (primary) hypertension: Secondary | ICD-10-CM | POA: Diagnosis not present

## 2023-02-22 DIAGNOSIS — I4891 Unspecified atrial fibrillation: Secondary | ICD-10-CM | POA: Diagnosis not present

## 2023-02-22 DIAGNOSIS — K219 Gastro-esophageal reflux disease without esophagitis: Secondary | ICD-10-CM | POA: Diagnosis not present

## 2023-02-22 DIAGNOSIS — J441 Chronic obstructive pulmonary disease with (acute) exacerbation: Secondary | ICD-10-CM | POA: Diagnosis not present

## 2023-02-22 DIAGNOSIS — I5030 Unspecified diastolic (congestive) heart failure: Secondary | ICD-10-CM | POA: Diagnosis not present

## 2023-02-23 DIAGNOSIS — I10 Essential (primary) hypertension: Secondary | ICD-10-CM | POA: Diagnosis not present

## 2023-02-23 DIAGNOSIS — I4891 Unspecified atrial fibrillation: Secondary | ICD-10-CM | POA: Diagnosis not present

## 2023-02-23 DIAGNOSIS — I5030 Unspecified diastolic (congestive) heart failure: Secondary | ICD-10-CM | POA: Diagnosis not present

## 2023-02-23 DIAGNOSIS — J441 Chronic obstructive pulmonary disease with (acute) exacerbation: Secondary | ICD-10-CM | POA: Diagnosis not present

## 2023-02-23 DIAGNOSIS — K219 Gastro-esophageal reflux disease without esophagitis: Secondary | ICD-10-CM | POA: Diagnosis not present

## 2023-02-23 DIAGNOSIS — J9611 Chronic respiratory failure with hypoxia: Secondary | ICD-10-CM | POA: Diagnosis not present

## 2023-02-24 ENCOUNTER — Ambulatory Visit: Payer: Medicare Other | Admitting: Family Medicine

## 2023-02-24 DIAGNOSIS — K219 Gastro-esophageal reflux disease without esophagitis: Secondary | ICD-10-CM | POA: Diagnosis not present

## 2023-02-24 DIAGNOSIS — J441 Chronic obstructive pulmonary disease with (acute) exacerbation: Secondary | ICD-10-CM | POA: Diagnosis not present

## 2023-02-24 DIAGNOSIS — J9611 Chronic respiratory failure with hypoxia: Secondary | ICD-10-CM | POA: Diagnosis not present

## 2023-02-24 DIAGNOSIS — I10 Essential (primary) hypertension: Secondary | ICD-10-CM | POA: Diagnosis not present

## 2023-02-24 DIAGNOSIS — I5030 Unspecified diastolic (congestive) heart failure: Secondary | ICD-10-CM | POA: Diagnosis not present

## 2023-02-24 DIAGNOSIS — I4891 Unspecified atrial fibrillation: Secondary | ICD-10-CM | POA: Diagnosis not present

## 2023-02-25 DIAGNOSIS — K219 Gastro-esophageal reflux disease without esophagitis: Secondary | ICD-10-CM | POA: Diagnosis not present

## 2023-02-25 DIAGNOSIS — I5030 Unspecified diastolic (congestive) heart failure: Secondary | ICD-10-CM | POA: Diagnosis not present

## 2023-02-25 DIAGNOSIS — J441 Chronic obstructive pulmonary disease with (acute) exacerbation: Secondary | ICD-10-CM | POA: Diagnosis not present

## 2023-02-25 DIAGNOSIS — J9611 Chronic respiratory failure with hypoxia: Secondary | ICD-10-CM | POA: Diagnosis not present

## 2023-02-25 DIAGNOSIS — I10 Essential (primary) hypertension: Secondary | ICD-10-CM | POA: Diagnosis not present

## 2023-02-25 DIAGNOSIS — I4891 Unspecified atrial fibrillation: Secondary | ICD-10-CM | POA: Diagnosis not present

## 2023-02-26 DIAGNOSIS — I10 Essential (primary) hypertension: Secondary | ICD-10-CM | POA: Diagnosis not present

## 2023-02-26 DIAGNOSIS — J9611 Chronic respiratory failure with hypoxia: Secondary | ICD-10-CM | POA: Diagnosis not present

## 2023-02-26 DIAGNOSIS — K219 Gastro-esophageal reflux disease without esophagitis: Secondary | ICD-10-CM | POA: Diagnosis not present

## 2023-02-26 DIAGNOSIS — I5030 Unspecified diastolic (congestive) heart failure: Secondary | ICD-10-CM | POA: Diagnosis not present

## 2023-02-26 DIAGNOSIS — I4891 Unspecified atrial fibrillation: Secondary | ICD-10-CM | POA: Diagnosis not present

## 2023-02-26 DIAGNOSIS — J441 Chronic obstructive pulmonary disease with (acute) exacerbation: Secondary | ICD-10-CM | POA: Diagnosis not present

## 2023-02-27 DIAGNOSIS — I4891 Unspecified atrial fibrillation: Secondary | ICD-10-CM | POA: Diagnosis not present

## 2023-02-27 DIAGNOSIS — K219 Gastro-esophageal reflux disease without esophagitis: Secondary | ICD-10-CM | POA: Diagnosis not present

## 2023-02-27 DIAGNOSIS — I10 Essential (primary) hypertension: Secondary | ICD-10-CM | POA: Diagnosis not present

## 2023-02-27 DIAGNOSIS — J9611 Chronic respiratory failure with hypoxia: Secondary | ICD-10-CM | POA: Diagnosis not present

## 2023-02-27 DIAGNOSIS — J441 Chronic obstructive pulmonary disease with (acute) exacerbation: Secondary | ICD-10-CM | POA: Diagnosis not present

## 2023-02-27 DIAGNOSIS — I5030 Unspecified diastolic (congestive) heart failure: Secondary | ICD-10-CM | POA: Diagnosis not present

## 2023-02-28 DIAGNOSIS — I5030 Unspecified diastolic (congestive) heart failure: Secondary | ICD-10-CM | POA: Diagnosis not present

## 2023-02-28 DIAGNOSIS — I10 Essential (primary) hypertension: Secondary | ICD-10-CM | POA: Diagnosis not present

## 2023-02-28 DIAGNOSIS — J441 Chronic obstructive pulmonary disease with (acute) exacerbation: Secondary | ICD-10-CM | POA: Diagnosis not present

## 2023-02-28 DIAGNOSIS — K219 Gastro-esophageal reflux disease without esophagitis: Secondary | ICD-10-CM | POA: Diagnosis not present

## 2023-02-28 DIAGNOSIS — I4891 Unspecified atrial fibrillation: Secondary | ICD-10-CM | POA: Diagnosis not present

## 2023-02-28 DIAGNOSIS — J9611 Chronic respiratory failure with hypoxia: Secondary | ICD-10-CM | POA: Diagnosis not present

## 2023-03-03 DIAGNOSIS — J441 Chronic obstructive pulmonary disease with (acute) exacerbation: Secondary | ICD-10-CM | POA: Diagnosis not present

## 2023-03-03 DIAGNOSIS — I5030 Unspecified diastolic (congestive) heart failure: Secondary | ICD-10-CM | POA: Diagnosis not present

## 2023-03-03 DIAGNOSIS — I10 Essential (primary) hypertension: Secondary | ICD-10-CM | POA: Diagnosis not present

## 2023-03-03 DIAGNOSIS — K219 Gastro-esophageal reflux disease without esophagitis: Secondary | ICD-10-CM | POA: Diagnosis not present

## 2023-03-03 DIAGNOSIS — I4891 Unspecified atrial fibrillation: Secondary | ICD-10-CM | POA: Diagnosis not present

## 2023-03-03 DIAGNOSIS — J9611 Chronic respiratory failure with hypoxia: Secondary | ICD-10-CM | POA: Diagnosis not present

## 2023-08-22 ENCOUNTER — Encounter: Payer: Medicare Other | Admitting: Dermatology

## 2023-11-14 IMAGING — US US EXTREM LOW VENOUS
1 series · 13 of 24 positions shown · non-contrast
Comparison: Chest XR, 02/24/2021.  CT chest, 06/05/2020.

CLINICAL DATA: Shortness of breath. Bilateral lower extremity
edema.



[Series 1: us extrem low venous · 0.05mm/px · 13 of 56 slices shown]
[im 1/56]
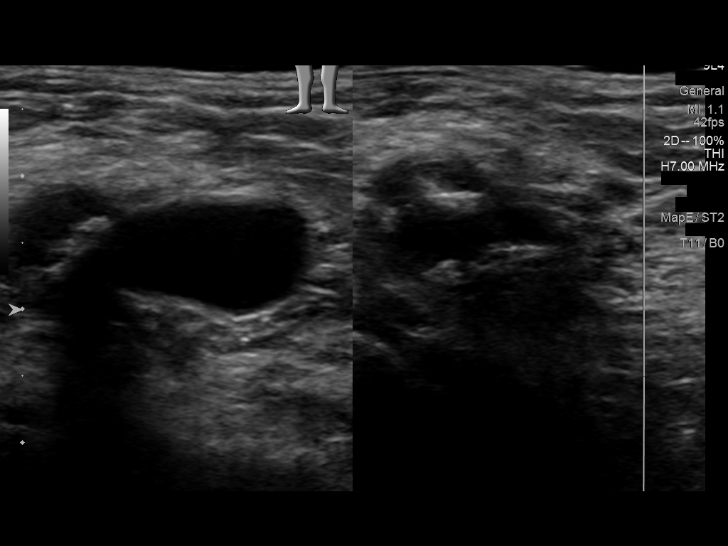
[im 5/56]
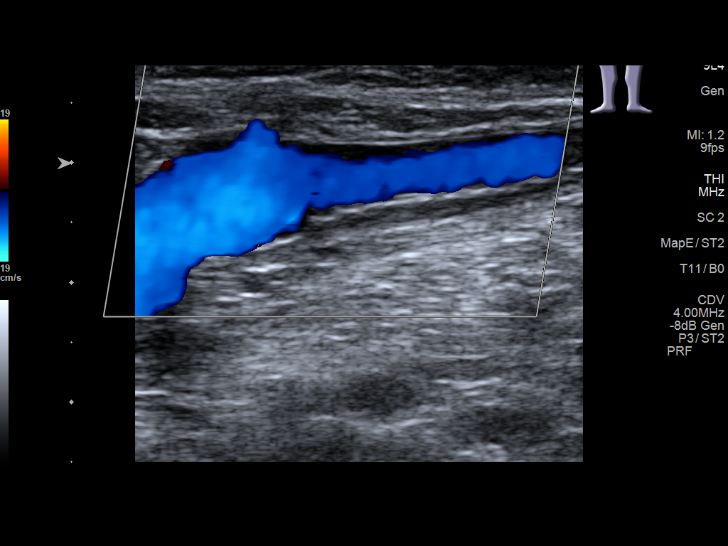
[im 10/56]
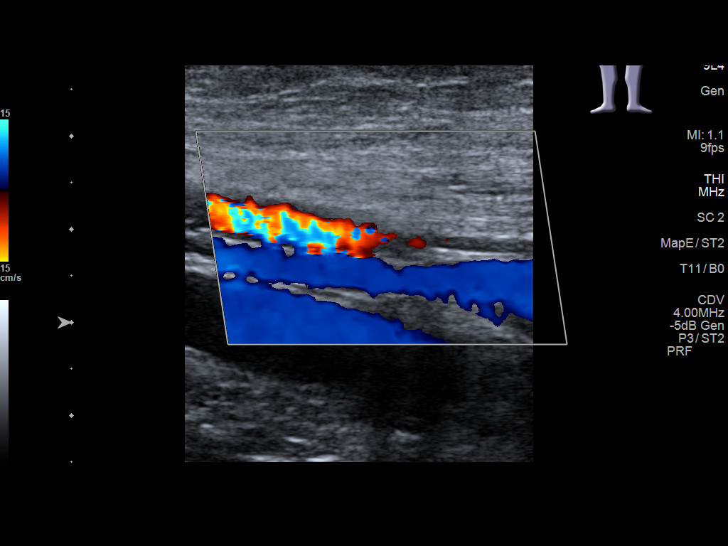
[im 15/56]
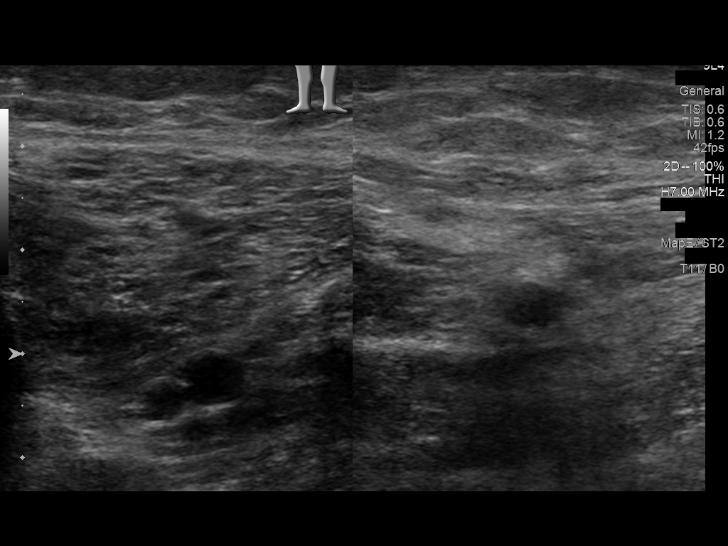
[im 20/56]
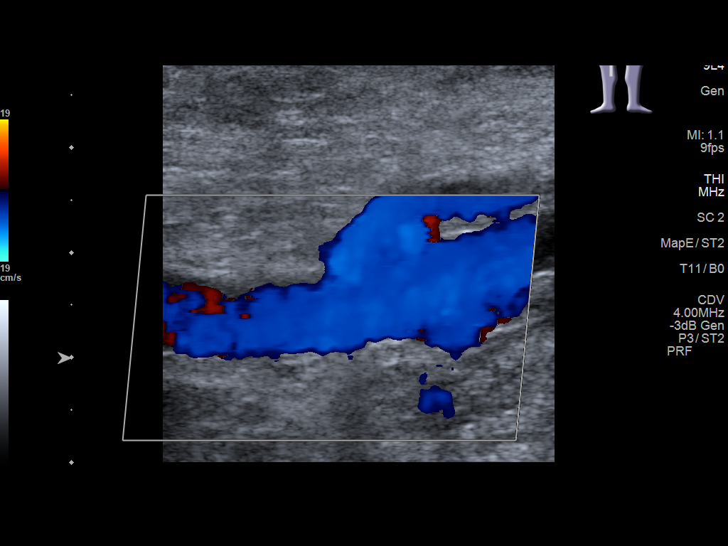
[im 24/56]
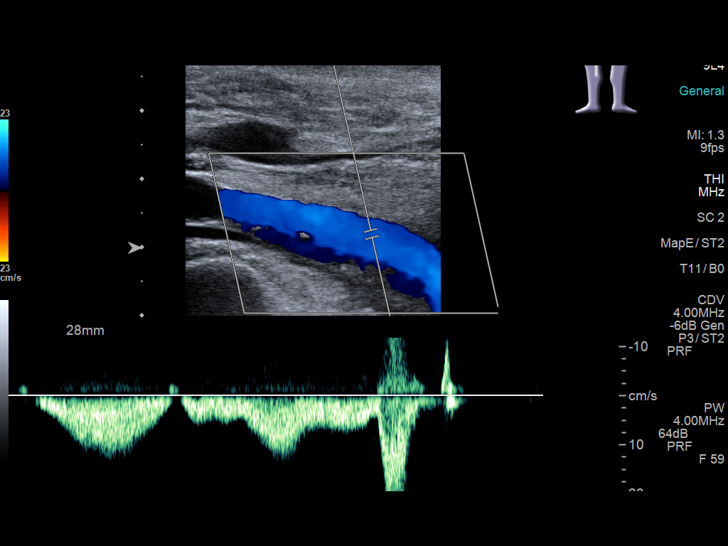
[im 29/56]
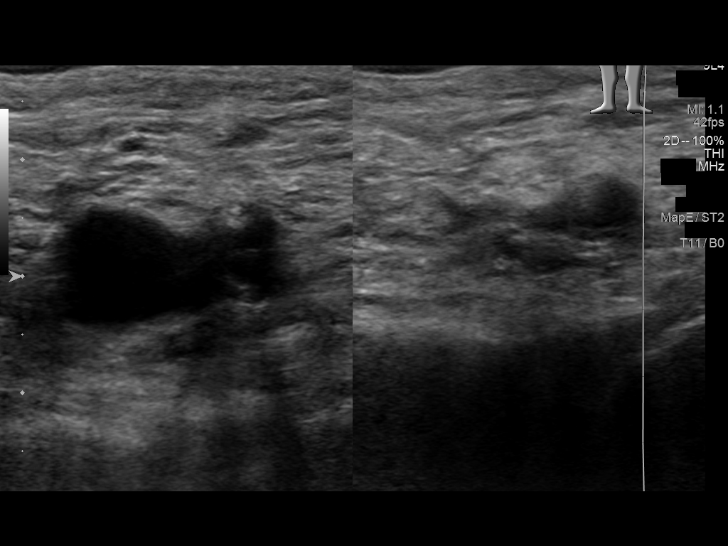
[im 32/56]
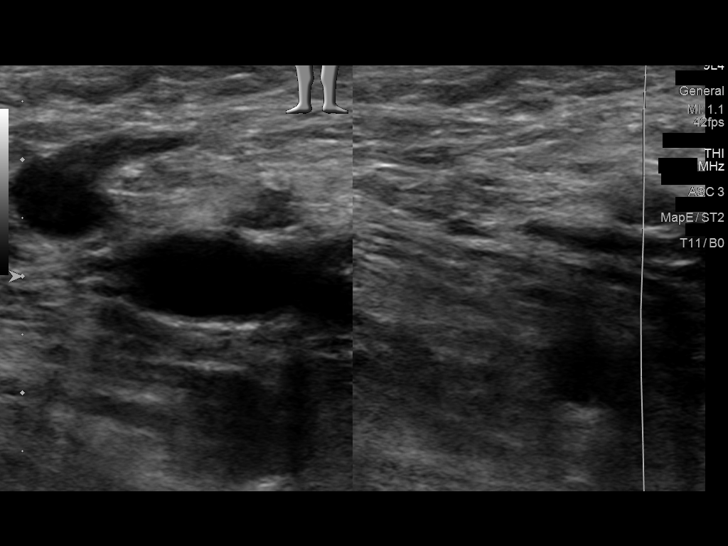
[im 36/56]
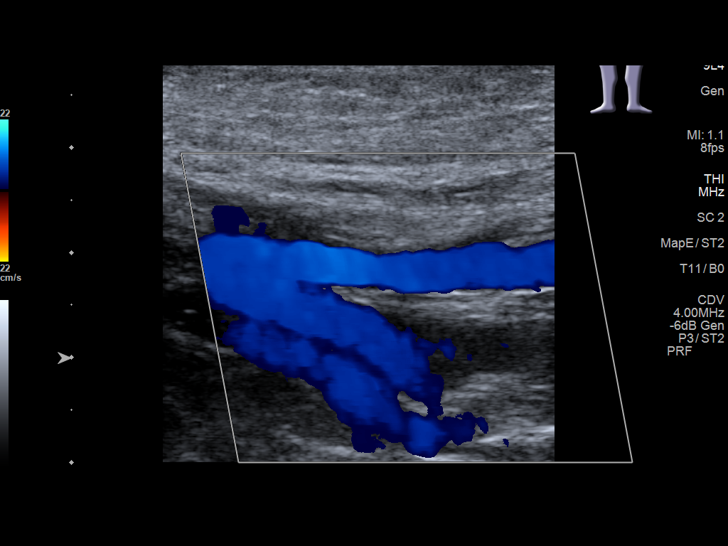
[im 41/56]
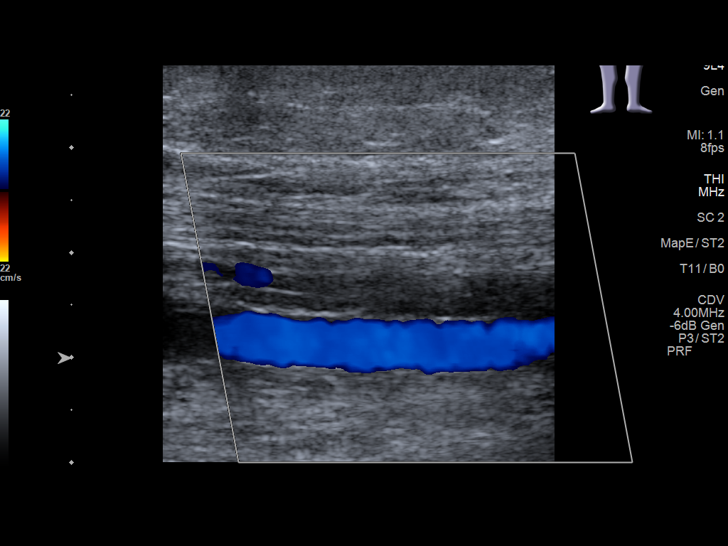
[im 46/56]
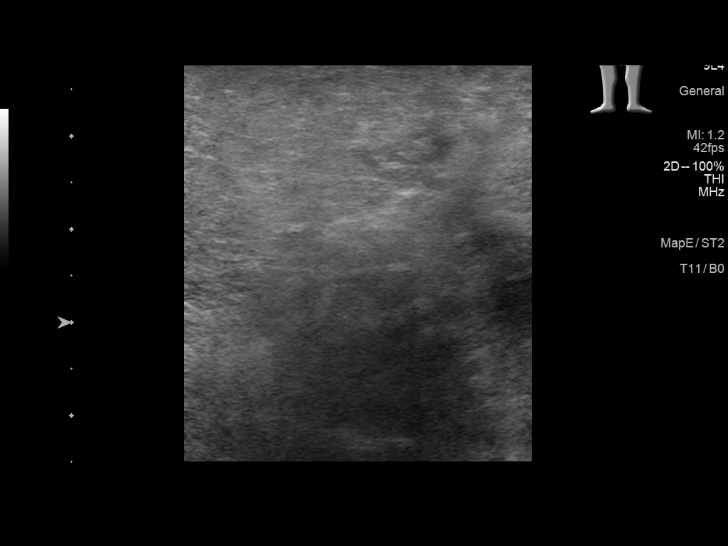
[im 51/56]
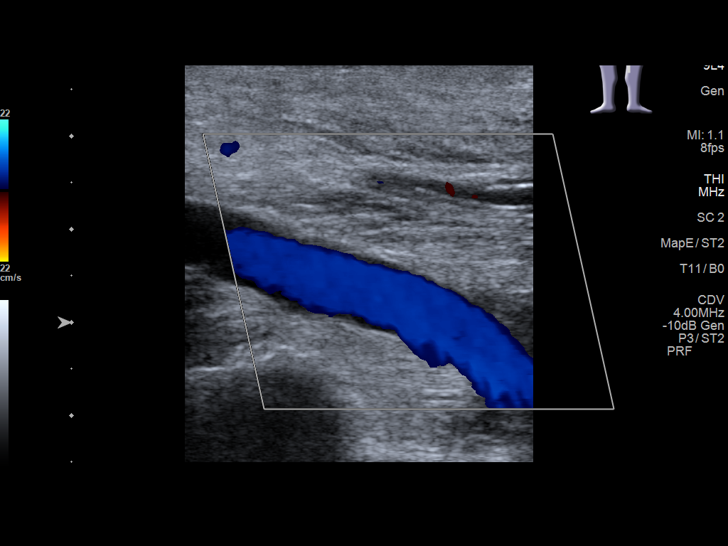
[im 56/56]
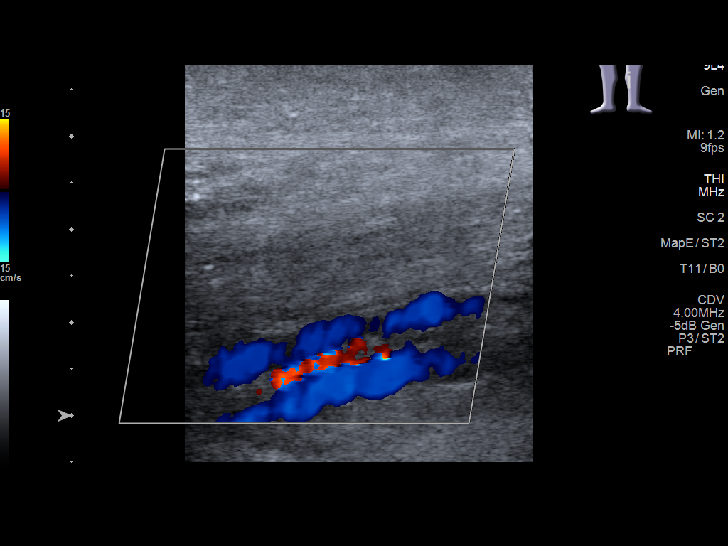

[13 of 24 positions shown; findings below may reference images not displayed]

FINDINGS: RIGHT LOWER EXTREMITY

VENOUS

Normal compressibility of the RIGHT common femoral, superficial
femoral, and popliteal veins, as well as the visualized calf veins.
Visualized portions of profunda femoral vein and great saphenous
vein unremarkable. No filling defects to suggest DVT on grayscale or
color Doppler imaging. Doppler waveforms show normal direction of
venous flow, normal respiratory plasticity and response to
augmentation.

OTHER

No evidence of superficial thrombophlebitis or abnormal fluid
collection.

Limitations: none

LEFT LOWER EXTREMITY

VENOUS

Normal compressibility of the LEFT common femoral, superficial
femoral, and popliteal veins, as well as the visualized calf veins.
Visualized portions of profunda femoral vein and great saphenous
vein unremarkable. No filling defects to suggest DVT on grayscale or
color Doppler imaging. Doppler waveforms show normal direction of
venous flow, normal respiratory plasticity and response to
augmentation.

OTHER

No evidence of superficial thrombophlebitis or abnormal fluid
collection.

Limitations: none
IMPRESSION: No evidence of femoropopliteal DVT within either lower extremity.

## 2024-01-03 IMAGING — CT CT HEAD W/O CM
4 series · 17 of 47 positions shown, 19 images · non-contrast
Comparison: CT head 03/02/2020.

CLINICAL DATA: Dizziness, persistent/recurrent, cardiac or vascular
cause suspected



[Series 2: head wo · axial · 0.44mm/px · z∈[-130,-15]mm · 7 of 31 slices shown, 9 images]
[im 4/31  brain]
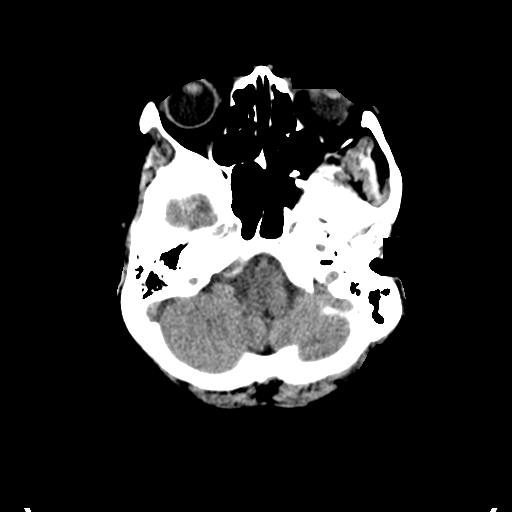
[im 4/31  bone]
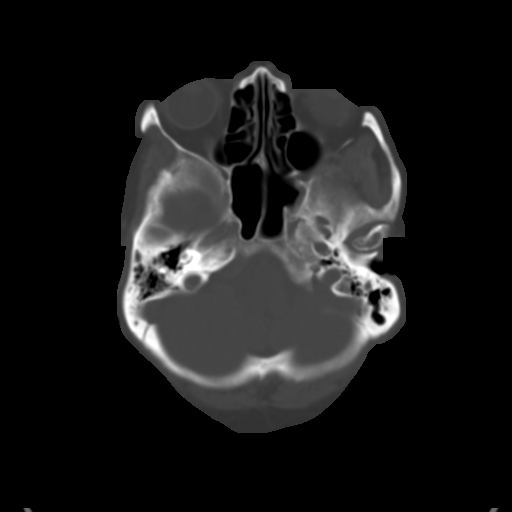
[im 8/31  brain]
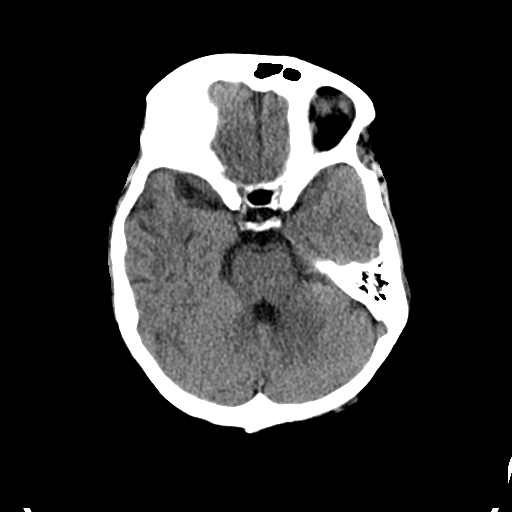
[im 12/31  brain]
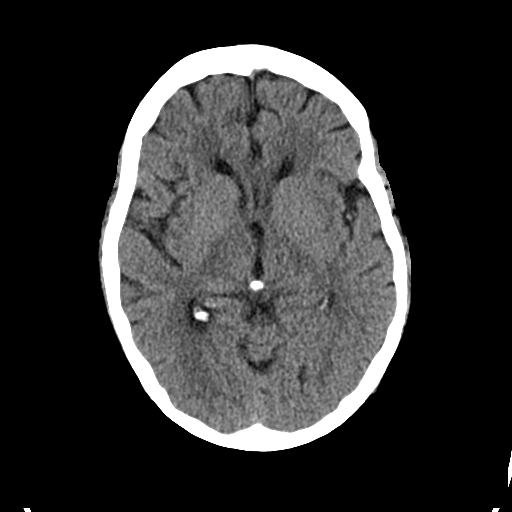
[im 16/31  brain]
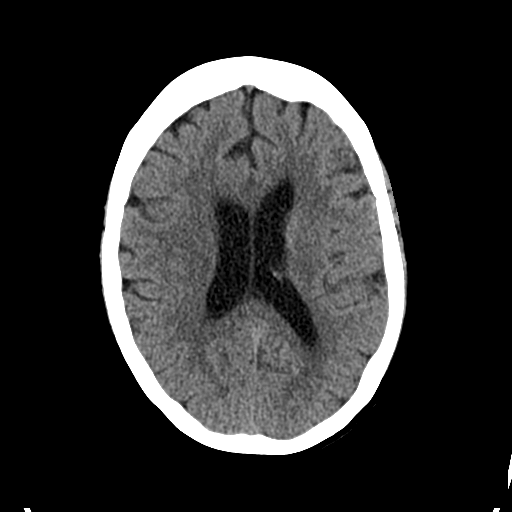
[im 19/31  brain]
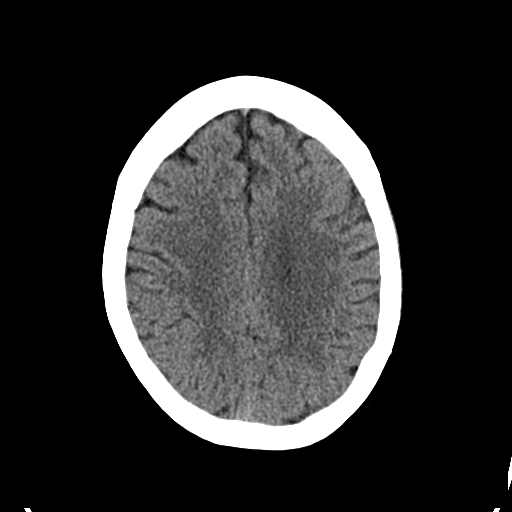
[im 19/31  bone]
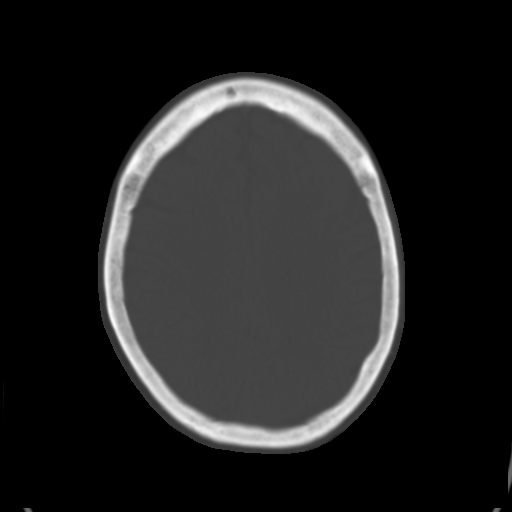
[im 23/31  brain]
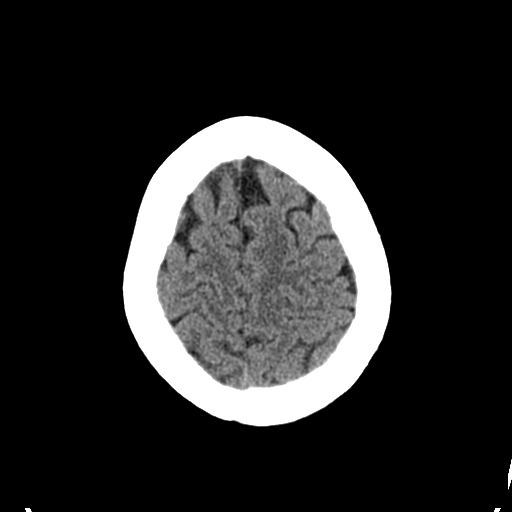
[im 27/31  brain]
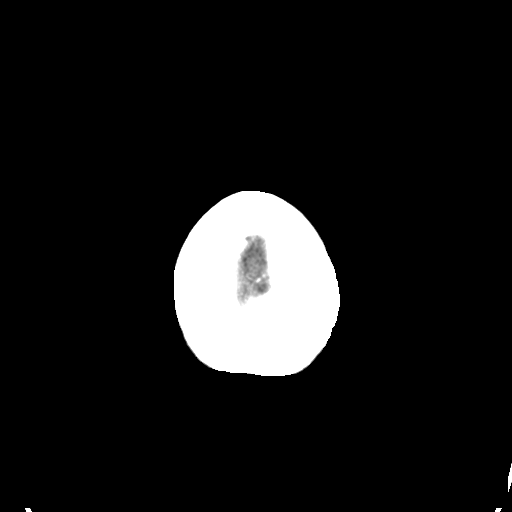

[Series 3: head bone · axial · 0.44mm/px · z∈[-131,-79]mm · 4 of 76 slices shown]
[im 8/76  bone]
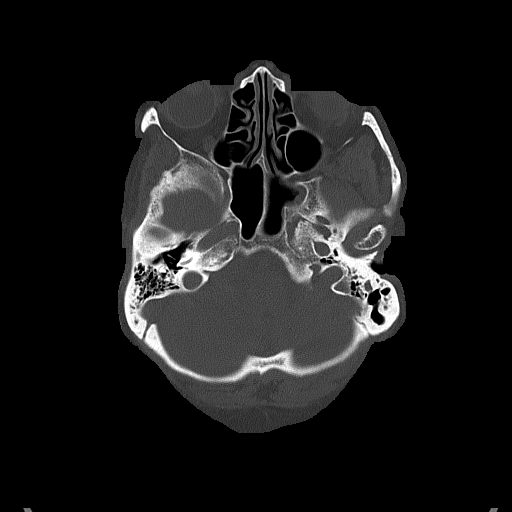
[im 16/76  bone]
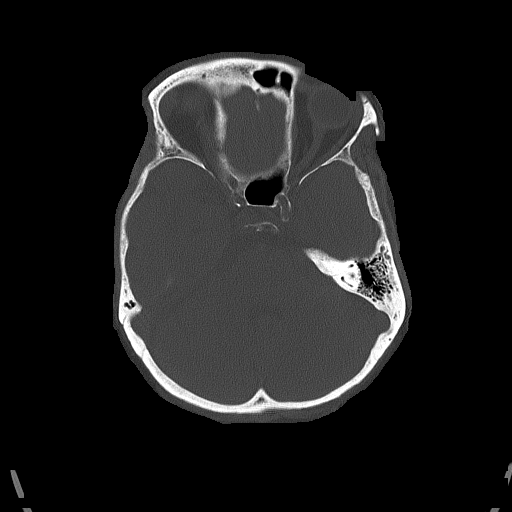
[im 23/76  bone]
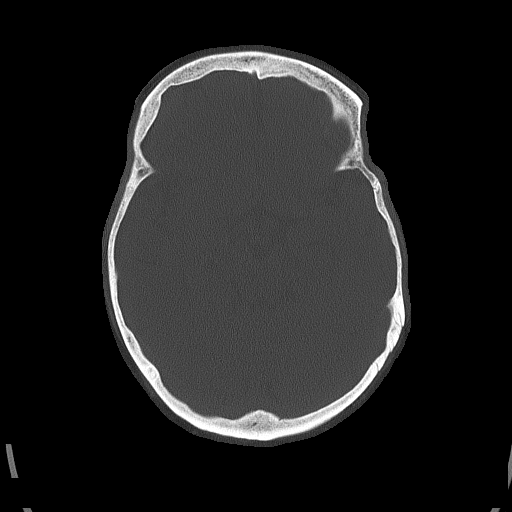
[im 34/76  bone]
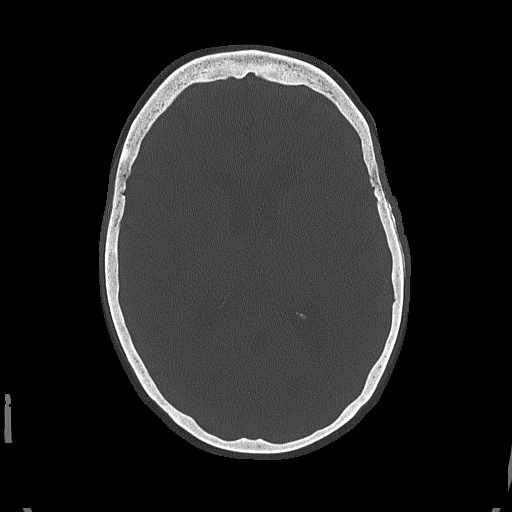

[Series 4: coronal soft tissue · coronal · 0.32mm/px · 3 of 63 slices shown]
[im 21/63  brain]
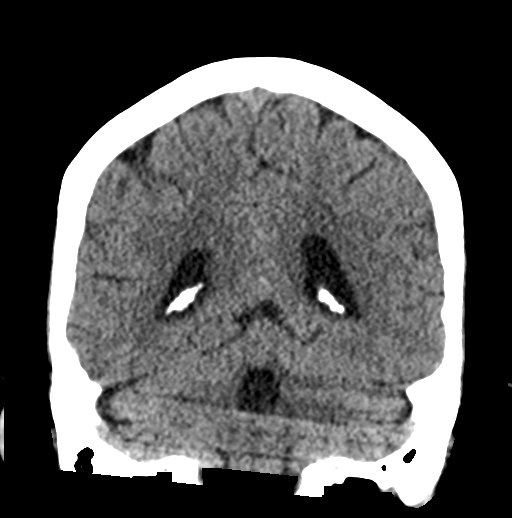
[im 28/63  brain]
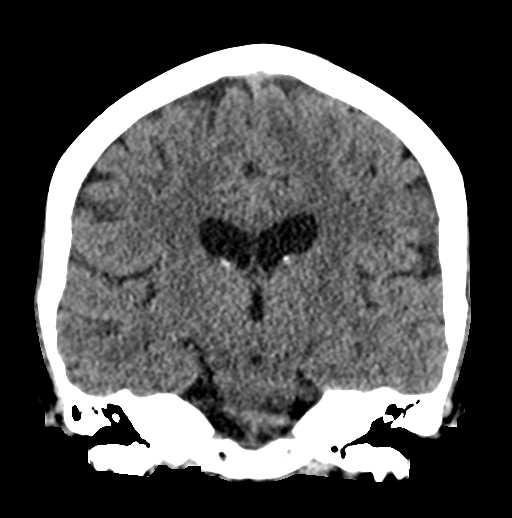
[im 35/63  brain]
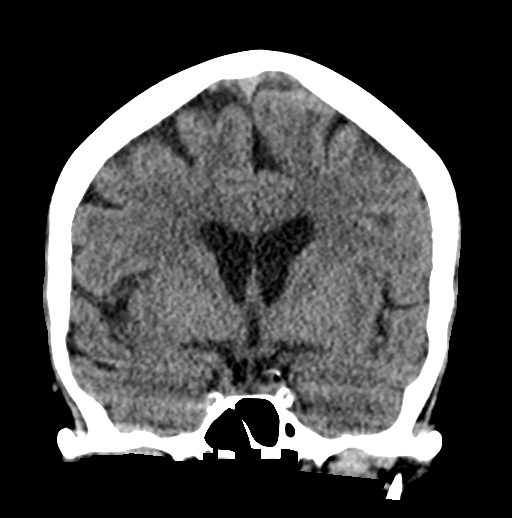

[Series 5: sagittal soft tissue · sagittal · 0.32mm/px · 3 of 48 slices shown]
[im 16/48  brain]
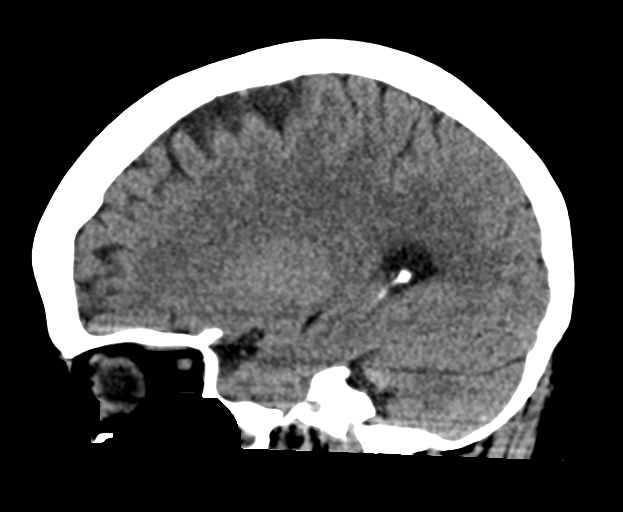
[im 24/48  brain]
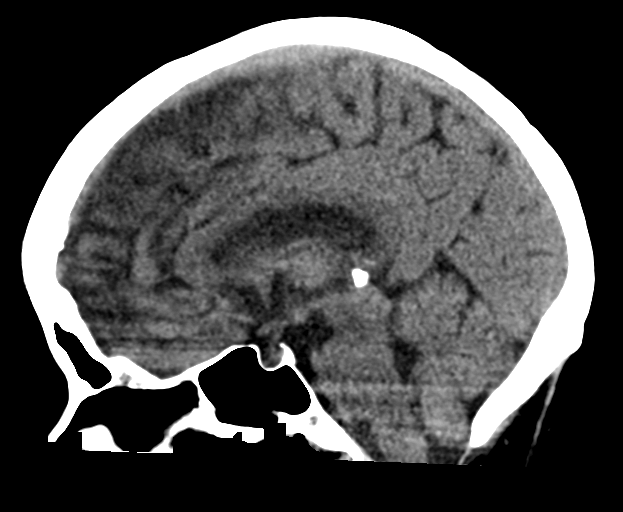
[im 32/48  brain]
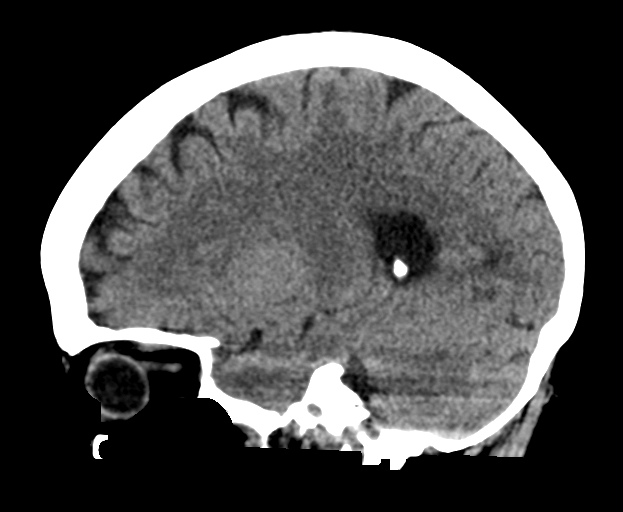

[17 of 47 positions shown; findings below may reference images not displayed]

FINDINGS: Brain: No evidence of acute infarction, hemorrhage, hydrocephalus,
extra-axial collection or mass lesion/mass effect. Mild patchy white
matter hypoattenuation, nonspecific but compatible with chronic
microvascular ischemic disease.

Vascular: No hyperdense vessel identified.

Skull: No acute fracture.

Sinuses/Orbits: Visualized sinuses are clear.  Unremarkable orbits.

Other: No mastoid effusions.
IMPRESSION: No evidence of acute intracranial abnormality.

## 2024-02-12 IMAGING — CT CT CHEST W/O CM
2 of 4 series · 15 of 36 positions shown, 18 images · non-contrast
Comparison: 06/05/2020

CLINICAL DATA: Follow-up pulmonary nodules, smoker, recent
pneumonia



[Series 2: chest 2.00 · axial · 0.50mm/px · z∈[-1128,-890]mm · 12 of 141 slices shown, 15 images]
[im 11/141  mediastinal]
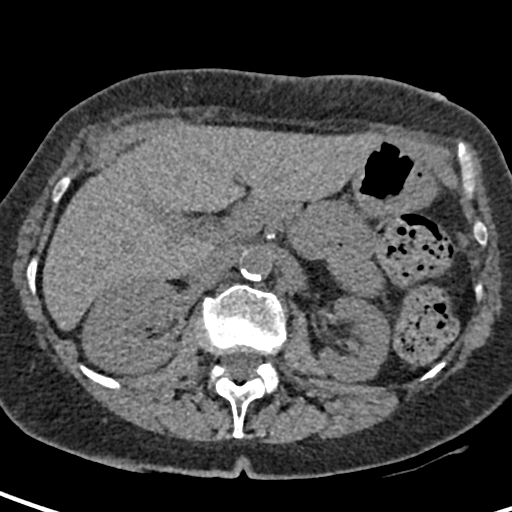
[im 11/141  lung]
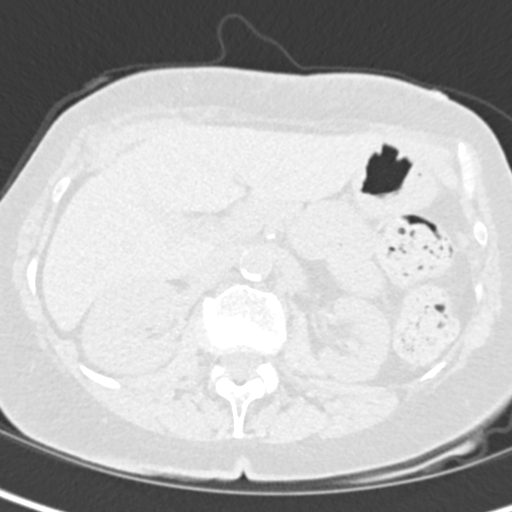
[im 22/141  lung]
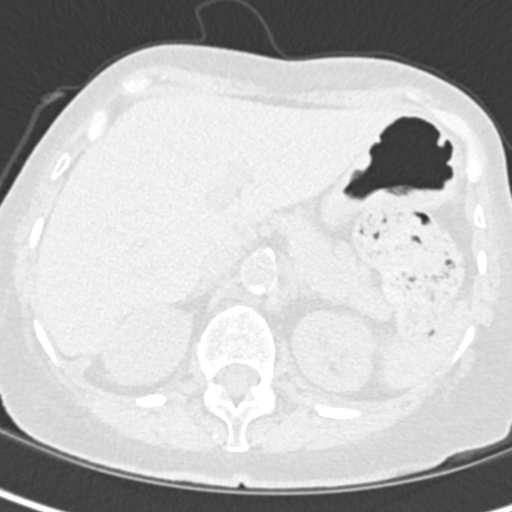
[im 33/141  lung]
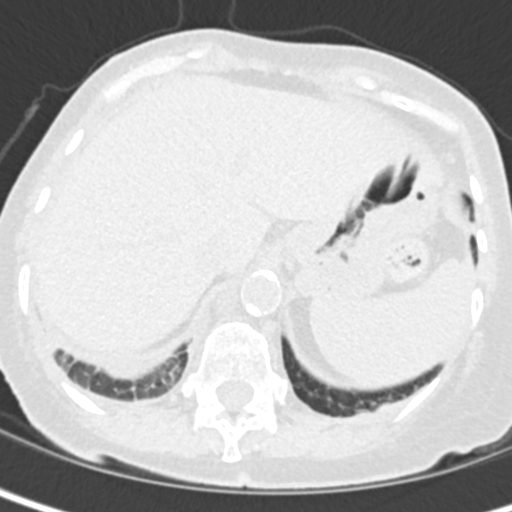
[im 44/141  lung]
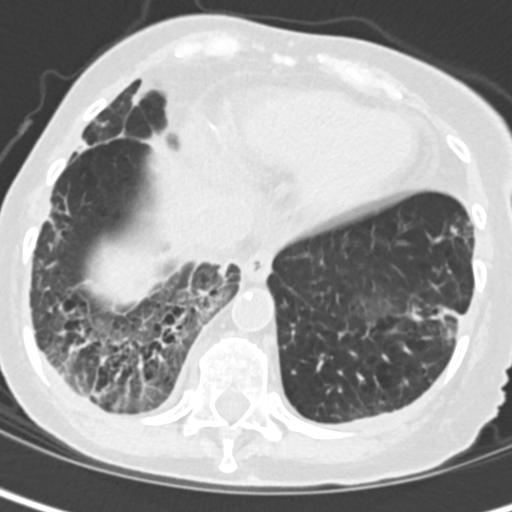
[im 54/141  mediastinal]
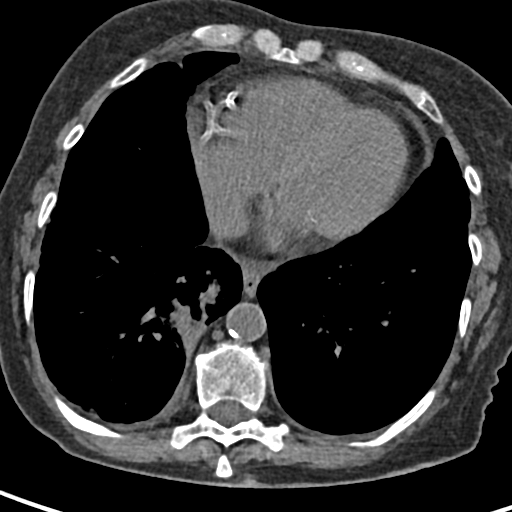
[im 54/141  lung]
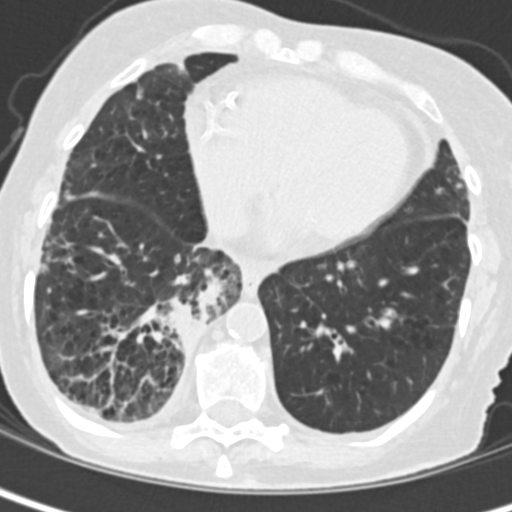
[im 65/141  lung]
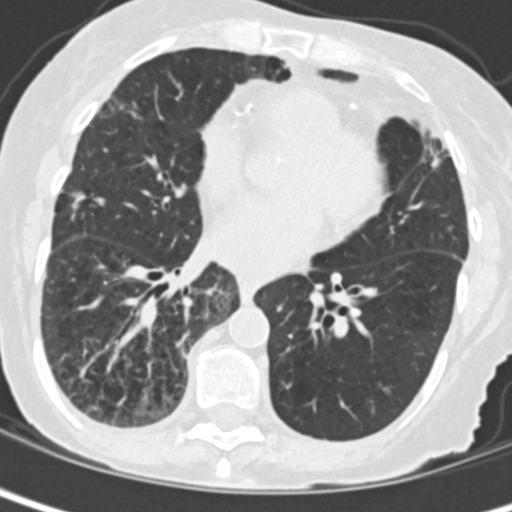
[im 76/141  lung]
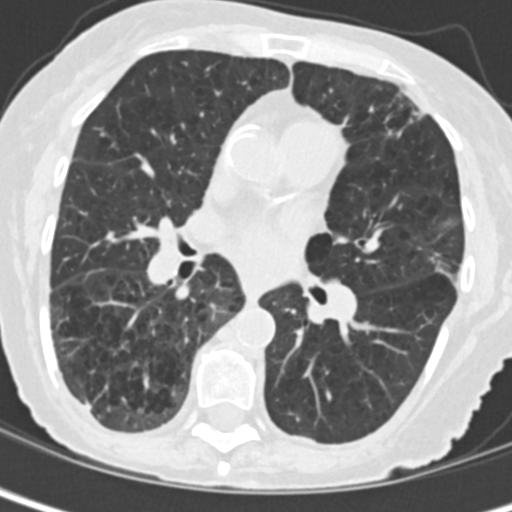
[im 87/141  lung]
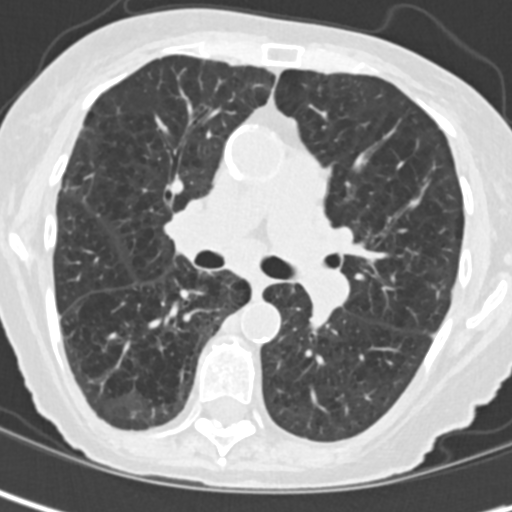
[im 97/141  mediastinal]
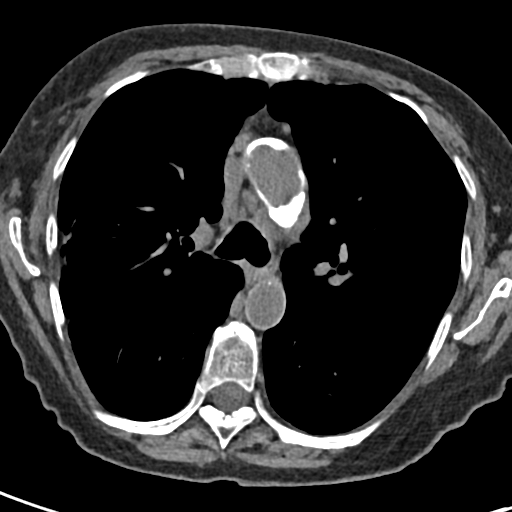
[im 97/141  lung]
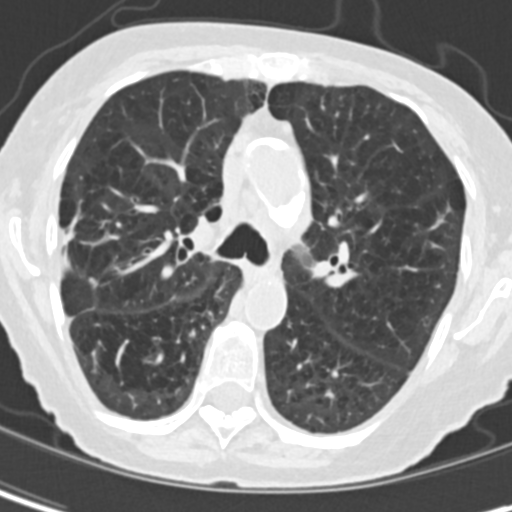
[im 108/141  lung]
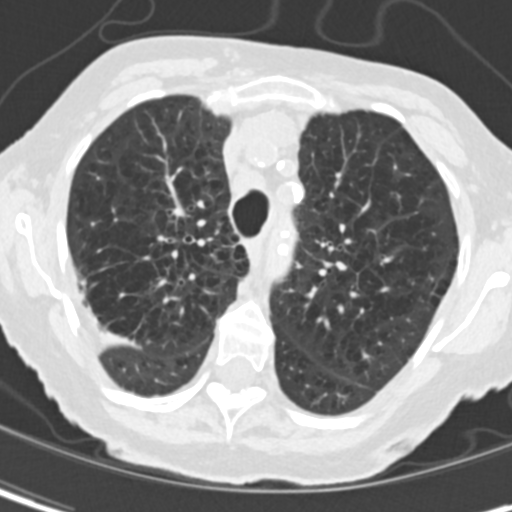
[im 119/141  lung]
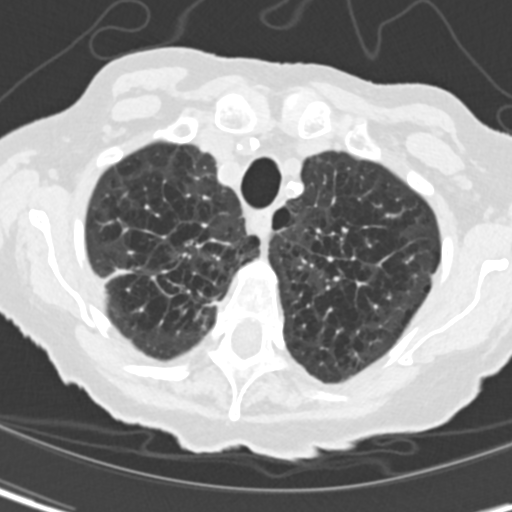
[im 130/141  lung]
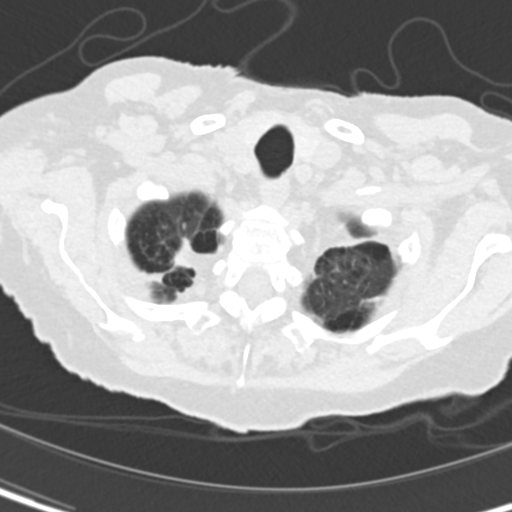

[Series 5: coronals chest 2.00 cor · coronal · 0.50mm/px · 3 of 127 slices shown]
[im 26/127  lung]
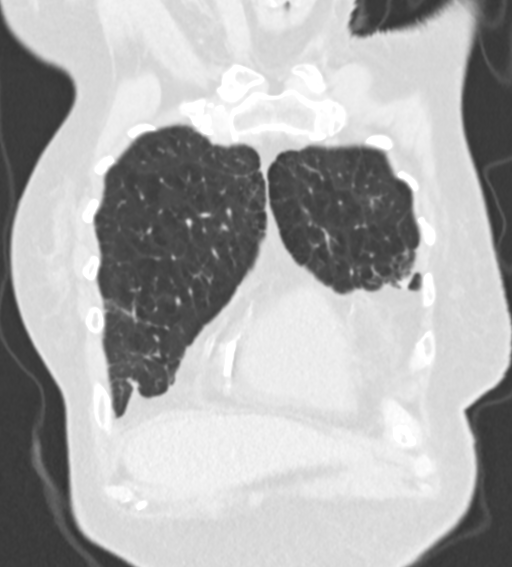
[im 51/127  lung]
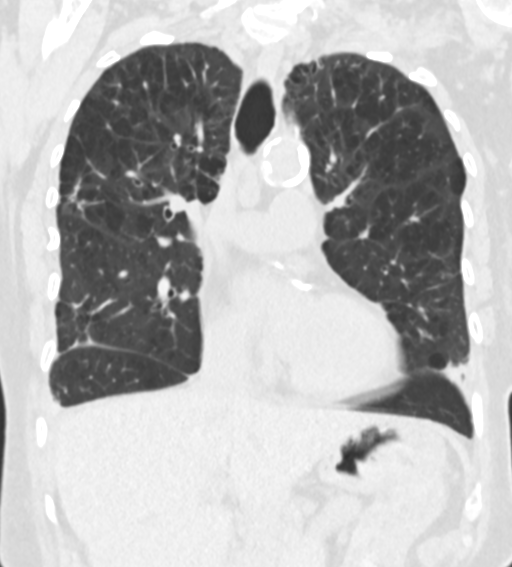
[im 76/127  lung]
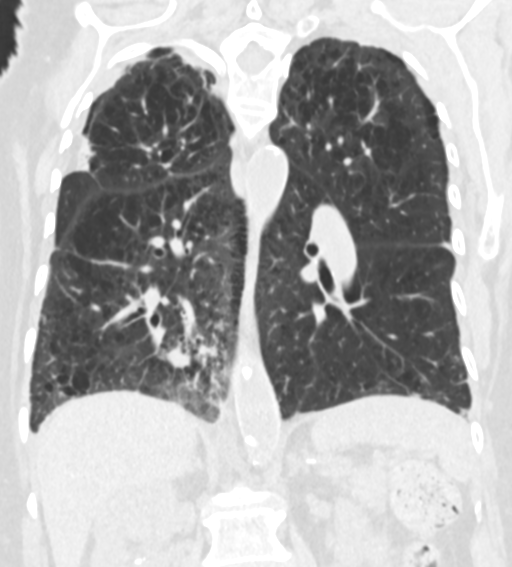

[15 of 36 positions shown; findings below may reference images not displayed]

FINDINGS: Cardiovascular: Aortic atherosclerosis. Normal heart size.
Three-vessel coronary artery calcifications. No pericardial
effusion.

Mediastinum/Nodes: Interval enlargement of pretracheal and
subcarinal lymph nodes, largest pretracheal node measuring 1.5 x
cm, previously subcentimeter (series 2, image 35). Thyroid gland,
trachea, and esophagus demonstrate no significant findings.

Lungs/Pleura: Severe centrilobular emphysema. Significant interval
increase in ground-glass, heterogeneous, and consolidative opacity
in the right lung base (series 3, image 100). There is a discrete
consolidation of the paramedian right lower lobe measuring 2.3 x
cm (series 3, image 90). Otherwise unchanged scattered bilateral
areas of irregular opacity, centrilobular and tree-in-bud
nodularity, and fibrotic scarring. No pleural effusion or
pneumothorax.

Upper Abdomen: No acute abnormality. Multiple small bilateral
nonobstructive renal calculi or renal vascular calcifications.

Musculoskeletal: No chest wall abnormality. No suspicious osseous
lesions identified.
IMPRESSION: 1. Significant interval increase in ground-glass, heterogeneous, and
consolidative opacity in the right lung base, generally in keeping
with reported history of recent pneumonia.
2. Conspicuous, discrete consolidation of the paramedian right lower
lobe measuring 2.3 cm, most likely infectious or inflammatory,
however recommend follow-up CT in 3 months to ensure complete
resolution and exclude malignancy.
3. Otherwise unchanged scattered areas of irregular opacity,
centrilobular and tree-in-bud nodularity, and fibrotic scarring, in
keeping with sequelae of atypical infection, particularly atypical
Mycobacterium.
4. Interval enlargement of pretracheal and subcarinal lymph nodes,
likely reactive.
5. Severe centrilobular emphysema.
6. Coronary artery disease.
7. Multiple small bilateral nonobstructive renal calculi or renal
vascular calcifications in the included upper abdomen.

Aortic Atherosclerosis (2FIKO-BG5.5) and Emphysema (2FIKO-XE6.L).
